# Patient Record
Sex: Male | Born: 1960 | Race: Black or African American | Hispanic: No | Smoking: Former smoker
Health system: Southern US, Community
[De-identification: ages and names within clinical notes are randomized; demographics above are authoritative.]

## PROBLEM LIST (undated history)

## (undated) DIAGNOSIS — Z9981 Dependence on supplemental oxygen: Secondary | ICD-10-CM

## (undated) DIAGNOSIS — I5020 Unspecified systolic (congestive) heart failure: Secondary | ICD-10-CM

## (undated) DIAGNOSIS — Z794 Long term (current) use of insulin: Secondary | ICD-10-CM

## (undated) DIAGNOSIS — K219 Gastro-esophageal reflux disease without esophagitis: Secondary | ICD-10-CM

## (undated) DIAGNOSIS — I10 Essential (primary) hypertension: Secondary | ICD-10-CM

## (undated) DIAGNOSIS — J449 Chronic obstructive pulmonary disease, unspecified: Secondary | ICD-10-CM

## (undated) DIAGNOSIS — Z95810 Presence of automatic (implantable) cardiac defibrillator: Secondary | ICD-10-CM

## (undated) DIAGNOSIS — Q859 Phakomatosis, unspecified: Secondary | ICD-10-CM

## (undated) DIAGNOSIS — N189 Chronic kidney disease, unspecified: Secondary | ICD-10-CM

## (undated) DIAGNOSIS — I2510 Atherosclerotic heart disease of native coronary artery without angina pectoris: Secondary | ICD-10-CM

## (undated) DIAGNOSIS — Z8739 Personal history of other diseases of the musculoskeletal system and connective tissue: Secondary | ICD-10-CM

## (undated) DIAGNOSIS — E1140 Type 2 diabetes mellitus with diabetic neuropathy, unspecified: Secondary | ICD-10-CM

## (undated) DIAGNOSIS — E7800 Pure hypercholesterolemia, unspecified: Secondary | ICD-10-CM

## (undated) DIAGNOSIS — I509 Heart failure, unspecified: Secondary | ICD-10-CM

## (undated) DIAGNOSIS — F419 Anxiety disorder, unspecified: Secondary | ICD-10-CM

## (undated) HISTORY — PX: ICD IMPLANT: EP1208

## (undated) HISTORY — PX: MULTIPLE TOOTH EXTRACTIONS: SHX2053

## (undated) HISTORY — PX: CIRCUMCISION: SUR203

## (undated) HISTORY — PX: EYE SURGERY: SHX253

## (undated) HISTORY — PX: CATARACT EXTRACTION W/ INTRAOCULAR LENS IMPLANTW/ TRABECULECTOMY: SHX1312

---

## 2000-08-19 ENCOUNTER — Emergency Department (HOSPITAL_COMMUNITY): Admission: EM | Admit: 2000-08-19 | Discharge: 2000-08-19 | Payer: Self-pay | Admitting: Emergency Medicine

## 2000-08-19 ENCOUNTER — Encounter: Payer: Self-pay | Admitting: Emergency Medicine

## 2000-10-17 ENCOUNTER — Emergency Department (HOSPITAL_COMMUNITY): Admission: EM | Admit: 2000-10-17 | Discharge: 2000-10-17 | Payer: Self-pay | Admitting: Emergency Medicine

## 2001-04-17 ENCOUNTER — Emergency Department (HOSPITAL_COMMUNITY): Admission: EM | Admit: 2001-04-17 | Discharge: 2001-04-18 | Payer: Self-pay | Admitting: *Deleted

## 2001-04-17 ENCOUNTER — Encounter: Payer: Self-pay | Admitting: Emergency Medicine

## 2001-04-23 ENCOUNTER — Emergency Department (HOSPITAL_COMMUNITY): Admission: EM | Admit: 2001-04-23 | Discharge: 2001-04-23 | Payer: Self-pay | Admitting: *Deleted

## 2001-07-30 ENCOUNTER — Emergency Department (HOSPITAL_COMMUNITY): Admission: EM | Admit: 2001-07-30 | Discharge: 2001-07-30 | Payer: Self-pay | Admitting: Emergency Medicine

## 2002-05-14 ENCOUNTER — Emergency Department (HOSPITAL_COMMUNITY): Admission: EM | Admit: 2002-05-14 | Discharge: 2002-05-14 | Payer: Self-pay | Admitting: Emergency Medicine

## 2002-05-14 ENCOUNTER — Encounter: Payer: Self-pay | Admitting: Emergency Medicine

## 2002-05-15 ENCOUNTER — Emergency Department (HOSPITAL_COMMUNITY): Admission: EM | Admit: 2002-05-15 | Discharge: 2002-05-15 | Payer: Self-pay | Admitting: Emergency Medicine

## 2002-05-20 ENCOUNTER — Encounter: Payer: Self-pay | Admitting: Emergency Medicine

## 2002-05-20 ENCOUNTER — Emergency Department (HOSPITAL_COMMUNITY): Admission: EM | Admit: 2002-05-20 | Discharge: 2002-05-20 | Payer: Self-pay | Admitting: Emergency Medicine

## 2004-03-28 ENCOUNTER — Emergency Department: Payer: Self-pay | Admitting: Emergency Medicine

## 2004-03-28 ENCOUNTER — Other Ambulatory Visit: Payer: Self-pay

## 2004-05-15 ENCOUNTER — Emergency Department: Payer: Self-pay | Admitting: Emergency Medicine

## 2017-01-11 DIAGNOSIS — Z95810 Presence of automatic (implantable) cardiac defibrillator: Secondary | ICD-10-CM

## 2017-01-11 HISTORY — DX: Presence of automatic (implantable) cardiac defibrillator: Z95.810

## 2017-10-31 ENCOUNTER — Inpatient Hospital Stay (HOSPITAL_COMMUNITY)
Admission: EM | Admit: 2017-10-31 | Discharge: 2017-11-06 | DRG: 246 | Disposition: A | Discharge status: Home Health | Payer: Medicare Other | Attending: Internal Medicine | Admitting: Internal Medicine

## 2017-10-31 ENCOUNTER — Other Ambulatory Visit: Payer: Self-pay

## 2017-10-31 ENCOUNTER — Emergency Department (HOSPITAL_COMMUNITY): Payer: Medicare Other

## 2017-10-31 ENCOUNTER — Encounter (HOSPITAL_COMMUNITY): Payer: Self-pay

## 2017-10-31 DIAGNOSIS — Z9981 Dependence on supplemental oxygen: Secondary | ICD-10-CM

## 2017-10-31 DIAGNOSIS — Z833 Family history of diabetes mellitus: Secondary | ICD-10-CM

## 2017-10-31 DIAGNOSIS — I2510 Atherosclerotic heart disease of native coronary artery without angina pectoris: Secondary | ICD-10-CM

## 2017-10-31 DIAGNOSIS — Z579 Occupational exposure to unspecified risk factor: Secondary | ICD-10-CM

## 2017-10-31 DIAGNOSIS — I214 Non-ST elevation (NSTEMI) myocardial infarction: Secondary | ICD-10-CM

## 2017-10-31 DIAGNOSIS — Z794 Long term (current) use of insulin: Secondary | ICD-10-CM

## 2017-10-31 DIAGNOSIS — I5022 Chronic systolic (congestive) heart failure: Secondary | ICD-10-CM | POA: Diagnosis present

## 2017-10-31 DIAGNOSIS — Z885 Allergy status to narcotic agent status: Secondary | ICD-10-CM

## 2017-10-31 DIAGNOSIS — E782 Mixed hyperlipidemia: Secondary | ICD-10-CM | POA: Diagnosis present

## 2017-10-31 DIAGNOSIS — I130 Hypertensive heart and chronic kidney disease with heart failure and stage 1 through stage 4 chronic kidney disease, or unspecified chronic kidney disease: Secondary | ICD-10-CM | POA: Diagnosis not present

## 2017-10-31 DIAGNOSIS — S81801A Unspecified open wound, right lower leg, initial encounter: Secondary | ICD-10-CM | POA: Diagnosis not present

## 2017-10-31 DIAGNOSIS — I509 Heart failure, unspecified: Secondary | ICD-10-CM

## 2017-10-31 DIAGNOSIS — L97929 Non-pressure chronic ulcer of unspecified part of left lower leg with unspecified severity: Secondary | ICD-10-CM | POA: Diagnosis present

## 2017-10-31 DIAGNOSIS — I25758 Atherosclerosis of native coronary artery of transplanted heart with other forms of angina pectoris: Secondary | ICD-10-CM | POA: Diagnosis not present

## 2017-10-31 DIAGNOSIS — I5043 Acute on chronic combined systolic (congestive) and diastolic (congestive) heart failure: Secondary | ICD-10-CM | POA: Diagnosis not present

## 2017-10-31 DIAGNOSIS — Z7951 Long term (current) use of inhaled steroids: Secondary | ICD-10-CM

## 2017-10-31 DIAGNOSIS — I420 Dilated cardiomyopathy: Secondary | ICD-10-CM | POA: Diagnosis present

## 2017-10-31 DIAGNOSIS — K219 Gastro-esophageal reflux disease without esophagitis: Secondary | ICD-10-CM | POA: Insufficient documentation

## 2017-10-31 DIAGNOSIS — J9621 Acute and chronic respiratory failure with hypoxia: Secondary | ICD-10-CM

## 2017-10-31 DIAGNOSIS — R0603 Acute respiratory distress: Secondary | ICD-10-CM | POA: Diagnosis not present

## 2017-10-31 DIAGNOSIS — R195 Other fecal abnormalities: Secondary | ICD-10-CM | POA: Diagnosis present

## 2017-10-31 DIAGNOSIS — E874 Mixed disorder of acid-base balance: Secondary | ICD-10-CM | POA: Diagnosis present

## 2017-10-31 DIAGNOSIS — I161 Hypertensive emergency: Secondary | ICD-10-CM | POA: Diagnosis not present

## 2017-10-31 DIAGNOSIS — R0689 Other abnormalities of breathing: Secondary | ICD-10-CM | POA: Diagnosis not present

## 2017-10-31 DIAGNOSIS — E872 Acidosis: Secondary | ICD-10-CM | POA: Diagnosis not present

## 2017-10-31 DIAGNOSIS — R296 Repeated falls: Secondary | ICD-10-CM | POA: Diagnosis present

## 2017-10-31 DIAGNOSIS — D751 Secondary polycythemia: Secondary | ICD-10-CM

## 2017-10-31 DIAGNOSIS — R0902 Hypoxemia: Secondary | ICD-10-CM | POA: Diagnosis not present

## 2017-10-31 DIAGNOSIS — Z95810 Presence of automatic (implantable) cardiac defibrillator: Secondary | ICD-10-CM

## 2017-10-31 DIAGNOSIS — Z79899 Other long term (current) drug therapy: Secondary | ICD-10-CM | POA: Diagnosis not present

## 2017-10-31 DIAGNOSIS — F410 Panic disorder [episodic paroxysmal anxiety] without agoraphobia: Secondary | ICD-10-CM

## 2017-10-31 DIAGNOSIS — Z961 Presence of intraocular lens: Secondary | ICD-10-CM | POA: Diagnosis present

## 2017-10-31 DIAGNOSIS — R0601 Orthopnea: Secondary | ICD-10-CM

## 2017-10-31 DIAGNOSIS — Z9889 Other specified postprocedural states: Secondary | ICD-10-CM

## 2017-10-31 DIAGNOSIS — J9622 Acute and chronic respiratory failure with hypercapnia: Secondary | ICD-10-CM

## 2017-10-31 DIAGNOSIS — Z7984 Long term (current) use of oral hypoglycemic drugs: Secondary | ICD-10-CM | POA: Diagnosis not present

## 2017-10-31 DIAGNOSIS — N189 Chronic kidney disease, unspecified: Secondary | ICD-10-CM | POA: Diagnosis present

## 2017-10-31 DIAGNOSIS — Z8249 Family history of ischemic heart disease and other diseases of the circulatory system: Secondary | ICD-10-CM

## 2017-10-31 DIAGNOSIS — I878 Other specified disorders of veins: Secondary | ICD-10-CM | POA: Diagnosis present

## 2017-10-31 DIAGNOSIS — I25118 Atherosclerotic heart disease of native coronary artery with other forms of angina pectoris: Secondary | ICD-10-CM

## 2017-10-31 DIAGNOSIS — J9601 Acute respiratory failure with hypoxia: Secondary | ICD-10-CM | POA: Diagnosis not present

## 2017-10-31 DIAGNOSIS — N183 Chronic kidney disease, stage 3 (moderate): Secondary | ICD-10-CM | POA: Diagnosis not present

## 2017-10-31 DIAGNOSIS — Z6832 Body mass index (BMI) 32.0-32.9, adult: Secondary | ICD-10-CM

## 2017-10-31 DIAGNOSIS — J449 Chronic obstructive pulmonary disease, unspecified: Secondary | ICD-10-CM | POA: Diagnosis present

## 2017-10-31 DIAGNOSIS — I872 Venous insufficiency (chronic) (peripheral): Secondary | ICD-10-CM | POA: Diagnosis not present

## 2017-10-31 DIAGNOSIS — I5023 Acute on chronic systolic (congestive) heart failure: Secondary | ICD-10-CM | POA: Diagnosis not present

## 2017-10-31 DIAGNOSIS — G4733 Obstructive sleep apnea (adult) (pediatric): Secondary | ICD-10-CM | POA: Diagnosis present

## 2017-10-31 DIAGNOSIS — E1140 Type 2 diabetes mellitus with diabetic neuropathy, unspecified: Secondary | ICD-10-CM

## 2017-10-31 DIAGNOSIS — Q859 Phakomatosis, unspecified: Secondary | ICD-10-CM

## 2017-10-31 DIAGNOSIS — I10 Essential (primary) hypertension: Secondary | ICD-10-CM | POA: Diagnosis present

## 2017-10-31 DIAGNOSIS — J9602 Acute respiratory failure with hypercapnia: Secondary | ICD-10-CM | POA: Diagnosis present

## 2017-10-31 DIAGNOSIS — I361 Nonrheumatic tricuspid (valve) insufficiency: Secondary | ICD-10-CM | POA: Diagnosis not present

## 2017-10-31 DIAGNOSIS — Z9849 Cataract extraction status, unspecified eye: Secondary | ICD-10-CM

## 2017-10-31 DIAGNOSIS — E119 Type 2 diabetes mellitus without complications: Secondary | ICD-10-CM | POA: Diagnosis not present

## 2017-10-31 DIAGNOSIS — R0602 Shortness of breath: Secondary | ICD-10-CM

## 2017-10-31 DIAGNOSIS — N62 Hypertrophy of breast: Secondary | ICD-10-CM | POA: Diagnosis present

## 2017-10-31 DIAGNOSIS — R358 Other polyuria: Secondary | ICD-10-CM | POA: Diagnosis present

## 2017-10-31 DIAGNOSIS — E1122 Type 2 diabetes mellitus with diabetic chronic kidney disease: Secondary | ICD-10-CM | POA: Diagnosis present

## 2017-10-31 DIAGNOSIS — Z87891 Personal history of nicotine dependence: Secondary | ICD-10-CM

## 2017-10-31 DIAGNOSIS — I428 Other cardiomyopathies: Secondary | ICD-10-CM | POA: Diagnosis not present

## 2017-10-31 DIAGNOSIS — I110 Hypertensive heart disease with heart failure: Secondary | ICD-10-CM | POA: Diagnosis not present

## 2017-10-31 DIAGNOSIS — E876 Hypokalemia: Secondary | ICD-10-CM | POA: Diagnosis present

## 2017-10-31 DIAGNOSIS — S81802A Unspecified open wound, left lower leg, initial encounter: Secondary | ICD-10-CM | POA: Diagnosis not present

## 2017-10-31 DIAGNOSIS — X58XXXA Exposure to other specified factors, initial encounter: Secondary | ICD-10-CM | POA: Diagnosis not present

## 2017-10-31 DIAGNOSIS — E669 Obesity, unspecified: Secondary | ICD-10-CM | POA: Diagnosis present

## 2017-10-31 DIAGNOSIS — E785 Hyperlipidemia, unspecified: Secondary | ICD-10-CM

## 2017-10-31 DIAGNOSIS — R17 Unspecified jaundice: Secondary | ICD-10-CM

## 2017-10-31 HISTORY — DX: Pure hypercholesterolemia, unspecified: E78.00

## 2017-10-31 HISTORY — DX: Gastro-esophageal reflux disease without esophagitis: K21.9

## 2017-10-31 HISTORY — DX: Chronic kidney disease, unspecified: N18.9

## 2017-10-31 HISTORY — DX: Chronic obstructive pulmonary disease, unspecified: J44.9

## 2017-10-31 HISTORY — DX: Atherosclerotic heart disease of native coronary artery without angina pectoris: I25.10

## 2017-10-31 HISTORY — DX: Dependence on supplemental oxygen: Z99.81

## 2017-10-31 HISTORY — DX: Anxiety disorder, unspecified: F41.9

## 2017-10-31 HISTORY — DX: Personal history of other diseases of the musculoskeletal system and connective tissue: Z87.39

## 2017-10-31 HISTORY — DX: Long term (current) use of insulin: Z79.4

## 2017-10-31 HISTORY — DX: Unspecified systolic (congestive) heart failure: I50.20

## 2017-10-31 HISTORY — DX: Presence of automatic (implantable) cardiac defibrillator: Z95.810

## 2017-10-31 HISTORY — DX: Type 2 diabetes mellitus with diabetic neuropathy, unspecified: E11.40

## 2017-10-31 HISTORY — DX: Phakomatosis, unspecified: Q85.9

## 2017-10-31 HISTORY — DX: Essential (primary) hypertension: I10

## 2017-10-31 LAB — CBC WITH DIFFERENTIAL/PLATELET
Basophils Absolute: 0 10*3/uL (ref 0.0–0.1)
Basophils Relative: 0 %
EOS ABS: 0 10*3/uL (ref 0.0–0.7)
Eosinophils Relative: 0 %
HCT: 56 % — ABNORMAL HIGH (ref 39.0–52.0)
Hemoglobin: 17.7 g/dL — ABNORMAL HIGH (ref 13.0–17.0)
LYMPHS ABS: 1.2 10*3/uL (ref 0.7–4.0)
LYMPHS PCT: 21 %
MCH: 31.9 pg (ref 26.0–34.0)
MCHC: 31.6 g/dL (ref 30.0–36.0)
MCV: 101.1 fL — AB (ref 78.0–100.0)
MONO ABS: 0.8 10*3/uL (ref 0.1–1.0)
Monocytes Relative: 14 %
Neutro Abs: 3.6 10*3/uL (ref 1.7–7.7)
Neutrophils Relative %: 65 %
PLATELETS: 253 10*3/uL (ref 150–400)
RBC: 5.54 MIL/uL (ref 4.22–5.81)
RDW: 14.9 % (ref 11.5–15.5)
WBC: 5.7 10*3/uL (ref 4.0–10.5)

## 2017-10-31 LAB — COMPREHENSIVE METABOLIC PANEL
ALBUMIN: 3.6 g/dL (ref 3.5–5.0)
ALT: 53 U/L (ref 17–63)
AST: 46 U/L — AB (ref 15–41)
Alkaline Phosphatase: 88 U/L (ref 38–126)
Anion gap: 11 (ref 5–15)
BILIRUBIN TOTAL: 2.1 mg/dL — AB (ref 0.3–1.2)
BUN: 17 mg/dL (ref 6–20)
CHLORIDE: 99 mmol/L — AB (ref 101–111)
CO2: 33 mmol/L — ABNORMAL HIGH (ref 22–32)
CREATININE: 1.37 mg/dL — AB (ref 0.61–1.24)
Calcium: 9 mg/dL (ref 8.9–10.3)
GFR calc Af Amer: >60 mL/min (ref >60)
GFR, EST NON AFRICAN AMERICAN: 57 mL/min — AB (ref >60)
GLUCOSE: 105 mg/dL — AB (ref 65–99)
POTASSIUM: 4.4 mmol/L (ref 3.5–5.1)
Sodium: 143 mmol/L (ref 135–145)
TOTAL PROTEIN: 7 g/dL (ref 6.5–8.1)

## 2017-10-31 LAB — I-STAT CHEM 8, ED
BUN: 26 mg/dL — ABNORMAL HIGH (ref 6–20)
CALCIUM ION: 1.08 mmol/L — AB (ref 1.15–1.40)
Chloride: 97 mmol/L — ABNORMAL LOW (ref 101–111)
Creatinine, Ser: 1.3 mg/dL — ABNORMAL HIGH (ref 0.61–1.24)
GLUCOSE: 101 mg/dL — AB (ref 65–99)
HCT: 57 % — ABNORMAL HIGH (ref 39.0–52.0)
HEMOGLOBIN: 19.4 g/dL — AB (ref 13.0–17.0)
Potassium: 4.3 mmol/L (ref 3.5–5.1)
SODIUM: 143 mmol/L (ref 135–145)
TCO2: 39 mmol/L — AB (ref 22–32)

## 2017-10-31 LAB — I-STAT ARTERIAL BLOOD GAS, ED
ACID-BASE EXCESS: 6 mmol/L — AB (ref 0.0–2.0)
BICARBONATE: 36.9 mmol/L — AB (ref 20.0–28.0)
O2 SAT: 97 %
PCO2 ART: 70.5 mmHg — AB (ref 32.0–48.0)
PO2 ART: 92 mmHg (ref 83.0–108.0)
Patient temperature: 96
TCO2: 39 mmol/L — AB (ref 22–32)
pH, Arterial: 7.32 — ABNORMAL LOW (ref 7.350–7.450)

## 2017-10-31 LAB — CBC
HCT: 56.3 % — ABNORMAL HIGH (ref 39.0–52.0)
HEMOGLOBIN: 18 g/dL — AB (ref 13.0–17.0)
MCH: 32.6 pg (ref 26.0–34.0)
MCHC: 32 g/dL (ref 30.0–36.0)
MCV: 102 fL — ABNORMAL HIGH (ref 78.0–100.0)
Platelets: 244 10*3/uL (ref 150–400)
RBC: 5.52 MIL/uL (ref 4.22–5.81)
RDW: 14.8 % (ref 11.5–15.5)
WBC: 5.7 10*3/uL (ref 4.0–10.5)

## 2017-10-31 LAB — I-STAT TROPONIN, ED: Troponin i, poc: 0.59 ng/mL (ref 0.00–0.08)

## 2017-10-31 LAB — CREATININE, SERUM
CREATININE: 1.37 mg/dL — AB (ref 0.61–1.24)
GFR calc Af Amer: >60 mL/min (ref >60)
GFR calc non Af Amer: 57 mL/min — ABNORMAL LOW (ref >60)

## 2017-10-31 LAB — HEMOGLOBIN A1C
HEMOGLOBIN A1C: 10.4 % — AB (ref 4.8–5.6)
Mean Plasma Glucose: 251.78 mg/dL

## 2017-10-31 LAB — GLUCOSE, CAPILLARY: Glucose-Capillary: 87 mg/dL (ref 65–99)

## 2017-10-31 LAB — TROPONIN I
TROPONIN I: 0.49 ng/mL — AB (ref <0.03)
Troponin I: 0.58 ng/mL (ref <0.03)

## 2017-10-31 LAB — BRAIN NATRIURETIC PEPTIDE: B Natriuretic Peptide: 671.4 pg/mL — ABNORMAL HIGH (ref 0.0–100.0)

## 2017-10-31 MED ORDER — INSULIN ASPART 100 UNIT/ML ~~LOC~~ SOLN
0.0000 [IU] | Freq: Every day | SUBCUTANEOUS | Status: DC
  Administered 2017-11-01: 2 [IU] via SUBCUTANEOUS
  Administered 2017-11-02: 22:00:00 3 [IU] via SUBCUTANEOUS
  Administered 2017-11-03 – 2017-11-05 (×3): 2 [IU] via SUBCUTANEOUS

## 2017-10-31 MED ORDER — ACETAMINOPHEN 325 MG PO TABS: 650.0000 mg | ORAL_TABLET | Freq: Four times a day (QID) | ORAL | Status: DC | PRN

## 2017-10-31 MED ORDER — PRAVASTATIN SODIUM 20 MG PO TABS: 20.0000 mg | ORAL_TABLET | Freq: Every day | ORAL | Status: DC

## 2017-10-31 MED ORDER — POLYETHYLENE GLYCOL 3350 17 G PO PACK: 17.0000 g | PACK | Freq: Every day | ORAL | Status: DC | PRN

## 2017-10-31 MED ORDER — HEPARIN (PORCINE) IN NACL 100-0.45 UNIT/ML-% IJ SOLN: 14.0000 [IU]/kg/h | Freq: Once | INTRAMUSCULAR | Status: DC

## 2017-10-31 MED ORDER — ACETAMINOPHEN 650 MG RE SUPP: 650.0000 mg | Freq: Four times a day (QID) | RECTAL | Status: DC | PRN

## 2017-10-31 MED ORDER — HEPARIN BOLUS VIA INFUSION
4000.0000 [IU] | Freq: Once | INTRAVENOUS | Status: AC
Start: 2017-10-31 — End: 2017-10-31
  Administered 2017-10-31: 4000 [IU] via INTRAVENOUS
  Filled 2017-10-31: qty 4000

## 2017-10-31 MED ORDER — NITROGLYCERIN IN D5W 200-5 MCG/ML-% IV SOLN
0.0000 ug/min | Freq: Once | INTRAVENOUS | Status: AC
  Administered 2017-10-31: 5 ug/min via INTRAVENOUS
  Filled 2017-10-31: qty 250

## 2017-10-31 MED ORDER — LORAZEPAM 2 MG/ML IJ SOLN
1.0000 mg | Freq: Once | INTRAMUSCULAR | Status: AC
  Administered 2017-10-31: 1 mg via INTRAVENOUS
  Filled 2017-10-31: qty 1

## 2017-10-31 MED ORDER — ONDANSETRON HCL 4 MG PO TABS: 4.0000 mg | ORAL_TABLET | Freq: Four times a day (QID) | ORAL | Status: DC | PRN

## 2017-10-31 MED ORDER — HEPARIN (PORCINE) IN NACL 100-0.45 UNIT/ML-% IJ SOLN
1300.0000 [IU]/h | INTRAMUSCULAR | Status: DC
  Administered 2017-10-31: 1300 [IU]/h via INTRAVENOUS
  Filled 2017-10-31 (×2): qty 250

## 2017-10-31 MED ORDER — FUROSEMIDE 10 MG/ML IJ SOLN
60.0000 mg | Freq: Once | INTRAMUSCULAR | Status: AC
  Administered 2017-10-31: 60 mg via INTRAVENOUS
  Filled 2017-10-31: qty 6

## 2017-10-31 MED ORDER — SODIUM CHLORIDE 0.9% FLUSH
3.0000 mL | Freq: Two times a day (BID) | INTRAVENOUS | Status: DC
Start: 2017-10-31 — End: 2017-11-06
  Administered 2017-11-01 – 2017-11-05 (×4): 3 mL via INTRAVENOUS

## 2017-10-31 MED ORDER — INSULIN ASPART 100 UNIT/ML ~~LOC~~ SOLN
0.0000 [IU] | Freq: Three times a day (TID) | SUBCUTANEOUS | Status: DC
  Administered 2017-11-01: 5 [IU] via SUBCUTANEOUS
  Administered 2017-11-01: 3 [IU] via SUBCUTANEOUS
  Administered 2017-11-02 – 2017-11-03 (×2): 5 [IU] via SUBCUTANEOUS

## 2017-10-31 MED ORDER — HEPARIN SODIUM (PORCINE) 5000 UNIT/ML IJ SOLN: 60.0000 [IU]/kg | Freq: Once | INTRAMUSCULAR | Status: DC

## 2017-10-31 MED ORDER — FUROSEMIDE 10 MG/ML IJ SOLN
40.0000 mg | Freq: Two times a day (BID) | INTRAMUSCULAR | Status: DC
  Administered 2017-10-31 – 2017-11-01 (×3): 40 mg via INTRAVENOUS
  Filled 2017-10-31 (×3): qty 4

## 2017-10-31 MED ORDER — DULOXETINE HCL 60 MG PO CPEP
60.0000 mg | ORAL_CAPSULE | Freq: Every day | ORAL | Status: DC
  Administered 2017-11-01 – 2017-11-06 (×5): 60 mg via ORAL
  Filled 2017-10-31 (×5): qty 1

## 2017-10-31 MED ORDER — SACUBITRIL-VALSARTAN 49-51 MG PO TABS
1.0000 | ORAL_TABLET | Freq: Two times a day (BID) | ORAL | Status: DC
  Administered 2017-10-31 – 2017-11-06 (×11): 1 via ORAL
  Filled 2017-10-31 (×12): qty 1

## 2017-10-31 MED ORDER — ENOXAPARIN SODIUM 80 MG/0.8ML ~~LOC~~ SOLN
0.5000 mg/kg | SUBCUTANEOUS | Status: DC
  Administered 2017-11-01 – 2017-11-03 (×2): 65 mg via SUBCUTANEOUS
  Filled 2017-10-31 (×2): qty 0.8
  Filled 2017-10-31: qty 0.65
  Filled 2017-10-31: qty 0.8

## 2017-10-31 MED ORDER — MAGNESIUM OXIDE 400 (241.3 MG) MG PO TABS
400.0000 mg | ORAL_TABLET | Freq: Every day | ORAL | Status: DC
  Administered 2017-10-31 – 2017-11-06 (×6): 400 mg via ORAL
  Filled 2017-10-31 (×6): qty 1

## 2017-10-31 MED ORDER — PANTOPRAZOLE SODIUM 40 MG PO TBEC
40.0000 mg | DELAYED_RELEASE_TABLET | Freq: Every day | ORAL | Status: DC
  Administered 2017-10-31 – 2017-11-06 (×6): 40 mg via ORAL
  Filled 2017-10-31 (×6): qty 1

## 2017-10-31 MED ORDER — ONDANSETRON HCL 4 MG/2ML IJ SOLN: 4.0000 mg | Freq: Four times a day (QID) | INTRAMUSCULAR | Status: DC | PRN

## 2017-10-31 MED ORDER — PREGABALIN 75 MG PO CAPS
225.0000 mg | ORAL_CAPSULE | Freq: Every day | ORAL | Status: DC
  Administered 2017-11-01 – 2017-11-06 (×5): 225 mg via ORAL
  Filled 2017-10-31 (×2): qty 3
  Filled 2017-10-31: qty 9
  Filled 2017-10-31 (×2): qty 3

## 2017-10-31 NOTE — Progress Notes (Signed)
ANTICOAGULATION CONSULT NOTE  Pharmacy Consult for heparin - Lovenox for VTE prophylaxis  Patient Measurements: Height: 6\' 5"  (195.6 cm) Weight: 280 lb (127 kg) IBW/kg (Calculated) : 89.1   Labs: Recent Labs    10/31/17 1038 10/31/17 1053 10/31/17 1411 10/31/17 1620  HGB 17.7* 19.4*  --  18.0*  HCT 56.0* 57.0*  --  56.3*  PLT 253  --   --  244  CREATININE 1.37* 1.30*  --  1.37*  TROPONINI  --   --  0.58*  --     Estimated Creatinine Clearance: 89.9 mL/min (A) (by C-G formula based on SCr of 1.37 mg/dL (H)).  Assessment/Plan: 63 YOM here with shortness of breath to started IV heparin for r/o ACS now to switch to Lovenox for VTE prophylaxis. Body mass index is 33.2 kg/m. Will begin Lovenox 65 mg SQ q24h (0.5 mg/kg/d). No bleeding noted, Hgb 18, platelets are normal. Pharmacy signing off.   Loura Back, PharmD, BCPS Clinical Pharmacist Clinical phone for 10/31/2017 until 10p is [phone] After 10p, please call Main Rx at [phone] for assistance 10/31/2017 7:25 PM

## 2017-10-31 NOTE — Progress Notes (Signed)
ANTICOAGULATION CONSULT NOTE - Initial Consult  Pharmacy Consult for heparin Indication: chest pain/ACS  Allergies  Allergen Reactions  . Codeine     Patient Measurements:  Actual body weight: 122 kg (patient reported)  Heparin Dosing Weight: 106 kg (esimated)  Vital Signs: Temp: 97.4 F (36.3 C) (05/08 1149) Temp Source: Oral (05/08 1149) BP: 182/127 (05/08 1149) Pulse Rate: 106 (05/08 1149)  Labs: Recent Labs    10/31/17 1038 10/31/17 1053  HGB 17.7* 19.4*  HCT 56.0* 57.0*  PLT 253  --   CREATININE 1.37* 1.30*    CrCl cannot be calculated (Unknown ideal weight.).   Medical History: Past Medical History:  Diagnosis Date  . CHF (congestive heart failure) (HCC)   . COPD (chronic obstructive pulmonary disease) (HCC)   . Diabetes mellitus without complication (HCC)   . GERD (gastroesophageal reflux disease)   . Hypertension   . Pacemaker     Medications:   (Not in a hospital admission)  Assessment: 80 YOM here with shortness of breath to start IV heparin for ACS. Initial troponin is 0.59. H/H 19.4/57. Plt wnl. He is not on any anticoagulation prior to admission.   Goal of Therapy:  Heparin level 0.3-0.7 units/ml Monitor platelets by anticoagulation protocol: Yes   Plan:  -Heparin 4000 units once, then start heparin at 1300 units/hr  -F/u 8 hr HL -Monitor daily HL, CBC and s/s of bleeding  Vinnie Level, PharmD., BCPS Clinical Pharmacist Clinical phone for 10/31/17 until 3:30pm: [phone] If after 3:30pm, please call main pharmacy at: [phone]

## 2017-10-31 NOTE — ED Notes (Addendum)
Pt still lethargic but arousable with stimulation. MD paged d/t patient has an IP bed assigned but it is on a tele unit which is inappropriate. Nitro gtt stopped per MD.

## 2017-10-31 NOTE — Consult Note (Addendum)
Cardiology Consultation:   Patient ID: Richard [name]; [id number]; [birth year]   Admit date: 10/31/2017 Date of Consult: 10/31/2017  Primary Care Provider: Patient, No Pcp Per Primary Cardiologist: No primary care provider on file.  Primary Electrophysiologist:  Dall[facility] [place]   Patient Profile:   Richard [name] is a 57 y.o. male with a hx of CHF who is being seen today for the evaluation of acute HF exacerbation and abnormal troponin at the request of Dr. Rogelia Boga.  History of Present Illness:   Richard [name] presented today with extreme dyspnea, confusion, severe hypoxemia and cough productive of frothy sputum.  He was temporarily treated with BiPAP but has improved and is now on oxygen via nasal cannula.  He is a very poor historian.  It is very difficult to obtain review of systems from him and he clearly does not have a lot of knowledge about his heart condition.  He is upset that "nobody ever told him that his heart is weak" until today.  Chronic medical problems include type 2 diabetes mellitus, hypertension, hyperlipidemia and apparently dilated cardiomyopathy, based on today's findings  Although he clearly had a defibrillator implanted and is followed by Richard [name] in [facility], I cannot find any records in care everywhere searching with both his name and his date of birth.  Chest x-ray shows pulmonary edema and a dual-chamber defibrillator.  There is massive cardiomegaly.  His arterial blood gas showed mild acidosis (pH 7.32), parameters consistent with chronic respiratory acidosis with metabolic compensation with superimposed acute hypercapnia (pCO2 70, pO2 90).  When asked about obstructive sleep apnea, he gave me a circuitous answer that I really did not understand.  He describes what sounds like cardiac catheterization via femoral approach in 2018.  He thinks they discovered one blockage and fixed it.  He denies ever having a myocardial infarction.  Subsequently he had a  defibrillator implanted which was last checked on July 25, 2017.  He reports that he usually weighs around 260 pounds, he has not weighed himself in several days but when he last checked he was up to 280 pounds.  He complains of poor appetite and abdominal bloating.  He does not understand why he is gaining weight since he eats very little.  He has noticed that his urine has been very concentrated recently.   He states that he adds "very little salt to his food, but he reports that he has been eating a lot of canned soups recently and he is not sure whether his wife has any salt to the food when she cooks.  Compliance with medicine is questionable.  When I asked him about Sherryll Burger he tells me that "he was supposed to pick it up from the pharmacy today".  He states that he sometimes skips his medicine because they make him feel sick, but that he takes his "pressure pill", his "fluid pill" and his "heart pill".  He does not know the names of any of these medications.  He does not know the name of his cardiologist and [place], but identifies Richard [name] as the physician who implanted his defibrillator (July 2018).  He has a Medtronic evera XT DR device implanted January 11, 2017 that has roughly 10.4 years of estimated longevity.  It was implanted for "primary prevention".  The device has never delivered therapy for ventricular arrhythmia and has not detected any atrial fibrillation.  He does not require atrial or ventricular pacing.  Typical activity level is around 2 hours/day, but  in the last week it has decreased to 1 hour/day.  Also in the last week there has been reversal of the normal day/night heart rate gradients.  His thoracic impedance (optivol) has shown evidence of fluid overload since October 2018 lead parameters are normal.  Past Medical History:  Diagnosis Date  . CHF (congestive heart failure) (HCC)   . COPD (chronic obstructive pulmonary disease) (HCC)   . Diabetes mellitus without  complication (HCC)   . GERD (gastroesophageal reflux disease)   . Hypertension   . Pacemaker    Past surgical history is significant for defibrillator implantation.  Family history is negative for cardiac illness, but the patient appears poorly informed.  Home Medications:  Prior to Admission medications   Medication Sig Start Date End Date Taking? Authorizing Provider  allopurinol (ZYLOPRIM) 100 MG tablet Take 100 mg by mouth daily. 08/11/17  Yes [provider]  BREO ELLIPTA 100-25 MCG/INH AEPB Inhale 1 puff into the lungs daily. 10/28/17  Yes [provider]  DULoxetine (CYMBALTA) 60 MG capsule Take 60 mg by mouth daily. 09/28/17  Yes [provider]  ENTRESTO 49-51 MG Take 1 tablet by mouth 2 (two) times daily. 10/08/17  Yes [provider]  gemfibrozil (LOPID) 600 MG tablet Take 600 mg by mouth 2 (two) times daily. 08/11/17  Yes [provider]  glipiZIDE (GLUCOTROL XL) 10 MG 24 hr tablet Take 10 mg by mouth 2 (two) times daily. 09/18/17  Yes [provider]  HUMULIN R U-500 KWIKPEN 500 UNIT/ML kwikpen Inject into the skin See admin instructions. Sliding scale as directed max daily dose 400 units 10/17/17  Yes [provider]  LYRICA 225 MG capsule Take 225 mg by mouth daily. 10/01/17  Yes [provider]  MAGNESIUM-OXIDE 400 (241.3 Mg) MG tablet Take 400 mg by mouth daily. 10/28/17  Yes [provider]  metoprolol succinate (TOPROL-XL) 25 MG 24 hr tablet Take 25 mg by mouth daily. 10/04/17  Yes [provider]  omeprazole (PRILOSEC) 20 MG capsule Take 20 mg by mouth daily. 10/04/17  Yes [provider]  pravastatin (PRAVACHOL) 20 MG tablet Take 20 mg by mouth daily. 09/06/17  Yes [provider]  torsemide (DEMADEX) 20 MG tablet Take 20 mg by mouth daily. 10/08/17  Yes [provider]  Vitamin D, Cholecalciferol, 1000 units CAPS Take 1,000 Units by mouth daily. 10/17/17  Yes [provider]    Inpatient Medications: Scheduled Meds: . DULoxetine  60 mg Oral Daily  . furosemide  40 mg Intravenous BID  . insulin aspart  0-15 Units Subcutaneous TID WC  . insulin aspart  0-5 Units Subcutaneous QHS  . magnesium oxide  400 mg Oral Daily  . pantoprazole  40 mg Oral Daily  . pravastatin  20 mg Oral q1800  . pregabalin  225 mg Oral Daily  . sacubitril-valsartan  1 tablet Oral BID  . sodium chloride flush  3 mL Intravenous Q12H   Continuous Infusions: . heparin 1,300 Units/hr (10/31/17 1306)   PRN Meds: acetaminophen **OR** acetaminophen, ondansetron **OR** ondansetron (ZOFRAN) IV, polyethylene glycol  Allergies:    Allergies  Allergen Reactions  . Codeine     Social History:   Social History   Socioeconomic History  . Marital status: Married    Spouse name: Not on file  . Number of children: Not on file  . Years of education: Not on file  . Highest education level: Not on file  Occupational History  .  Not on file  Social Needs  . Financial resource strain: Not on file  . Food insecurity:    Worry: Not on file    Inability: Not on file  . Transportation needs:    Medical: Not on file    Non-medical: Not on file  Tobacco Use  . Smoking status: Not on file  Substance and Sexual Activity  . Alcohol use: Not on file  . Drug use: Not on file  . Sexual activity: Not on file  Lifestyle  . Physical activity:    Days per week: Not on file    Minutes per session: Not on file  . Stress: Not on file  Relationships  . Social connections:    Talks on phone: Not on file    Gets together: Not on file    Attends religious service: Not on file    Active member of club or organization: Not on file    Attends meetings of clubs or organizations: Not on file    Relationship status: Not on file  . Intimate partner violence:    Fear of current or ex partner: Not on file    Emotionally abused: Not on file    Physically abused: Not on file    Forced  sexual activity: Not on file  Other Topics Concern  . Not on file  Social History Narrative  . Not on file    Family History:   Denies cardiac problems, but the patient has little knowledge of his family's medical problems.  ROS:  Please see the history of present illness.   All other ROS reviewed and negative.     Physical Exam/Data:   Vitals:   10/31/17 1515 10/31/17 1530 10/31/17 1545 10/31/17 1600  BP: 129/87 (!) 134/98 (!) 128/101 (!) 126/108  Pulse:  83 (!) 42 (!) 42  Resp: (!) 22 20 17 18   Temp:      TempSrc:      SpO2:  92% 97% 96%  Patient reported weight is 280 pounds.  Intake/Output Summary (Last 24 hours) at 10/31/2017 1757 Last data filed at 10/31/2017 1515 Gross per 24 hour  Intake 7.45 ml  Output -  Net 7.45 ml    General:  Well nourished, well developed, tachypnea, but no longer in acute distress. HEENT: normal Lymph: no adenopathy Neck: Jugular venous pulsations are elevated to the angle of the jaw and there is no additional hepatojugular reflux Endocrine:  No thryomegaly Vascular: No carotid bruits; FA pulses 2+ bilaterally without bruits  Cardiac:  normal S1, S2; RRR; S3 gallop is heard.  There is a 1/6 apical holosystolic murmur radiating towards the axilla that is difficult to hear.  There may be a separate 1/6 left lower sternal border murmur that is also holosystolic. Lungs:  clear to auscultation bilaterally, no wheezing, rhonchi or rales  Abd: soft, moderately distended, suspect ascites and hepatomegaly Ext: no edema.  However he has chronic brawny dermatitis changes suggesting previous edema.  Of note he had Unna boots bilaterally when he was admitted to the emergency room but these have now been removed. 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  QTc prolonged at 490 ms Telemetry:  Telemetry was personally reviewed and demonstrates: Normal sinus rhythm  Relevant CV Studies: ICD check as described above.  Thoracic impedance is well above normal range, but no recent ventricular or atrial arrhythmia detected.  No significant atrial or ventricular pacing.  Laboratory Data:  Chemistry Recent Labs  Lab 10/31/17 1038 10/31/17 1053 10/31/17 1620  NA 143 143  --   K 4.4 4.3  --   CL 99* 97*  --   CO2 33*  --   --   GLUCOSE 105* 101*  --   BUN 17 26*  --   CREATININE 1.37* 1.30* 1.37*  CALCIUM 9.0  --   --   GFRNONAA 57*  --  57*  GFRAA >60  --  >60  ANIONGAP 11  --   --     Recent Labs  Lab 10/31/17 1038  PROT 7.0  ALBUMIN 3.6  AST 46*  ALT 53  ALKPHOS 88  BILITOT 2.1*   Hematology Recent Labs  Lab 10/31/17 1038 10/31/17 1053 10/31/17 1620  WBC 5.7  --  5.7  RBC 5.54  --  5.52  HGB 17.7* 19.4* 18.0*  HCT 56.0* 57.0* 56.3*  MCV 101.1*  --  102.0*  MCH 31.9  --  32.6  MCHC 31.6  --  32.0  RDW 14.9  --  14.8  PLT 253  --  244   Cardiac EnzymesNo results for input(s): TROPONINI in the last 168 hours.  Recent Labs  Lab 10/31/17 1051  TROPIPOC 0.59*    BNP Recent Labs  Lab 10/31/17 1038  BNP 671.4*    DDimer No results for input(s): DDIMER in the last 168 hours.  Radiology/Studies:  Dg Chest Port 1 View  Result Date: 10/31/2017 CLINICAL DATA:  Acute onset of shortness of breath. Current history of COPD and CHF. EXAM: PORTABLE CHEST 1 VIEW COMPARISON:  09/06/2017, 01/12/2017 and earlier. FINDINGS: Cardiac silhouette markedly enlarged, unchanged. Interval development of moderate diffuse interstitial pulmonary edema since the most recent prior examination. No visible confluent airspace consolidation. The previously  identified circumscribed mass in the LEFT lower lobe is much less conspicuous on the portable examination. IMPRESSION: Moderate CHF, with stable marked cardiomegaly and moderate diffuse interstitial pulmonary edema. Electronically Signed   By: Hulan Saas M.D.   On: 10/31/2017 10:27    Assessment and Plan:   1. Acute respiratory failure: With combined hypoxia and hypercapnia.  Acute decompensation is due to congestive heart failure, but suspect he has underlying chronic hypoventilation may be due to COPD and/or obesity hypoventilation syndrome.  Chronicity of hypoventilation is supported by polycythemia and chronic metabolic alkalosis.  He is clearly a "CO2 retainer".  Avoid high flow oxygen. 2. CHF: Acute exacerbation of chronic heart failure, suspect combined systolic and diastolic.  It is unclear whether he has ischemic cardiomyopathy (he definitely has many risk factors, but denies angina) or nonischemic cardiomyopathy.  Old records would be very valuable.  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  There is evidence of elevation in both left and right heart pressures and per his report he is at least 20 pounds above his target weight.  There is clear reported sodium indiscretion and it is possible that he has also missed many of his medications.  Unable to find any records of his cardiac illness in the electronic medical record, even though he is  followed in [facility].  At this point he will benefit most from treatment with intravenous diuretics and restarting his Entresto.  Since he is barely compensated, I would avoid restarting beta-blockers at this time.  He might benefit from unloading with intravenous nitroglycerin, temporarily.  For the time being he does not appear to need inotropes. We will ask our heart failure service to evaluate him in the morning.  3. DM: Receiving both oral  antidiabetic's and insulin.  I think he would benefit from switching from a sulfonylurea to an SGLT2 inhibitor such as Jardiance. 4. HLP: He is on gemfibrozil and pravastatin at home, just and that he has mixed hyperlipidemia.  This may be due to poorly controlled diabetes mellitus.  I think it is best to set a target LDL less than 70. 5. Renal insufficiency: Acuity unknown, could simply be due to hemodynamic decompensation or could have chronic kidney disease related to hypertension and diabetes mellitus.  Again, old records are not valuable in this situation. 6. Abn troponin: readily explained by heart failure exacerbation and resp failure, no reason to suspect a true acute coronary event.   For questions or updates, please contact [facility] Please consult [email or web address] for contact info under Cardiology/STEMI.   Signed, Thurmon Fair, MD  10/31/2017 5:57 PM

## 2017-10-31 NOTE — H&P (Signed)
Date: 10/31/2017               Patient Name:  Richard [name] MRN: [id number]  DOB: Nov 07, 1960 Age / Sex: 57 y.o., male   PCP: Ananias Pilgrim, MD         Medical Service: Internal Medicine Teaching Service         Attending Physician: Dr. Odom Spain, MD    First Contact: Brooke Bonito Pager: [phone]  Second Contact: Dr. Arnetha Courser Pager: [phone]       After Hours (After 5p/  First Contact Pager: [phone]  weekends / holidays): Second Contact Pager: [phone]   Chief Complaint: Shortness of Breath  History of Present Illness: Richard [name] is a 40 yoM with a PMHx of severe COPD, systolic heart failure secondary to nonischemic cardiomyopathy with ICD, nonobstructive CAD, HTN, HLD, and CKD who presents with shortness of breath, cough and symptoms of respiratory distress.  Patient was quite somnolent during interview, as he had just received 1mg  Ativan. As a result much of HPI is gleaned from chart review. He arrived by ambulance to [facility] ED with tachypnea requiring BiPAP. Highest recorded blood pressure from ED was 168/148, with diastolic pressures consistently over 120. He was placed on a nitroglycerin drip and a heparin drip. Within the first two hours, he was weaned from BiPAP to his home O2 requirement of 2L.  He states that he typically has 1.5 pillow orthopnea but had to add an additional pillow last night. He denies headache, nausea/vomiting, diarrhea, vision changes, but does feel that his abdomen is particularly distended today. He states that his last BM was early this morning. He endorses polyuria without dysuria.  He reports that his dry weight is 265 lbs and knows to frequently weigh himself, though he states that lately his weights have been all over the place. He endorses waking up gasping for breath approximately twice per night. He states that he has tried to wear a CPAP but "it didn't work that good."  For cardiology, he sees Dr. Lance Morin of [facility] in HF clinic & last visit was 10/02/2017 (records obtained). At that visit, records reveal that he was suffering from lightheadedness and subjective signs of volume contraction so his torsemide dose was decreased from 20mg  BID to qd and acetazolamide was added to his regimen. Richard [name] states that it doesn't seem that the torsemide is working for him anymore and that it seems like he has been taking it for too long. His home regimen at that time also included ASA, Entresto 50mg  BID, Gemfibrozil, metoprolol XL 25mg  qd, pravastatin and spironolactone 25mg  qd.  Meds:  Current Meds  Medication Sig  . allopurinol (ZYLOPRIM) 100 MG tablet Take 100 mg by mouth daily.  Marland Kitchen BREO ELLIPTA 100-25 MCG/INH AEPB Inhale 1 puff into the lungs daily.  . DULoxetine (CYMBALTA) 60 MG capsule Take 60 mg by mouth daily.  Marland Kitchen ENTRESTO 49-51 MG Take 1 tablet by mouth 2 (two) times daily.  Marland Kitchen gemfibrozil (LOPID) 600 MG tablet Take 600 mg by mouth 2 (two) times daily.  Marland Kitchen glipiZIDE (GLUCOTROL XL) 10 MG 24 hr tablet Take 10 mg by mouth 2 (two) times daily.  Marland Kitchen HUMULIN R U-500 KWIKPEN 500 UNIT/ML kwikpen Inject into the skin See admin instructions. Sliding scale as directed max daily dose 400 units  . LYRICA 225 MG capsule Take 225 mg by mouth daily.  Marland Kitchen MAGNESIUM-OXIDE 400 (241.3 Mg) MG  tablet Take 400 mg by mouth daily.  . metoprolol succinate (TOPROL-XL) 25 MG 24 hr tablet Take 25 mg by mouth daily.  Marland Kitchen omeprazole (PRILOSEC) 20 MG capsule Take 20 mg by mouth daily.  . pravastatin (PRAVACHOL) 20 MG tablet Take 20 mg by mouth daily.  Marland Kitchen torsemide (DEMADEX) 20 MG tablet Take 20 mg by mouth daily.  . Vitamin D, Cholecalciferol, 1000 units CAPS Take 1,000 Units by mouth daily.     Allergies: Allergies as of 10/31/2017 - Review Complete 10/31/2017  Allergen Reaction Noted  . Codeine  10/31/2017   Past Medical History:  Diagnosis Date  . CKD (chronic kidney disease)   . COPD (chronic  obstructive pulmonary disease) (HCC)    never smoker, industrial exposure  . Diabetes mellitus with diabetic neuropathy, with long-term current use of insulin (HCC)   . GERD (gastroesophageal reflux disease)   . Hypertension   . ICD (implantable cardioverter-defibrillator) in place   . Nonobstructive atherosclerosis of coronary artery   . On home oxygen therapy   . 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HCC)    LLL, present for years  . Systolic HF (heart failure) (HCC)     Family History: Positive for HTN, DM and heart problems.  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 Now on disability.  Review of Systems: A complete ROS was negative except as per HPI.  Physical Exam: Blood pressure (!) 129/93, pulse 94, temperature (!) (P) 97.2 F (36.2 C), temperature source (P) Oral, resp. rate (!) 32, SpO2 95 %.   Physical Exam  Constitutional: He appears well-developed. He appears ill (wearing nasal cannula). He appears distressed.  HENT:  Head: Normocephalic and atraumatic.  Eyes: Right conjunctiva is injected. Left conjunctiva is injected.  Cardiovascular: Normal rate and regular rhythm.  Palpable ICD  Pulmonary/Chest: Effort normal and breath sounds normal. He has no rales.  Abdominal: Soft. Bowel sounds are normal. He exhibits distension.  Musculoskeletal:       Right lower leg: Normal. Right lower leg edema: bilateral skin breakdown.       Left lower leg: Normal. Left lower leg edema: bilateral skin breakdown.  Neurological: He is alert. He is disoriented (mildly).  Skin: Skin is warm.  Psychiatric: He has a normal mood and affect. His behavior is normal.   Lab Results  Component Value Date   WBC 5.7 10/31/2017   HGB 18.0 (H) 10/31/2017   HCT 56.3 (H) 10/31/2017   PLT 244 10/31/2017   GLUCOSE 101 (H) 10/31/2017   ALT 53 10/31/2017   AST 46 (H) 10/31/2017   NA 143 10/31/2017   K 4.3 10/31/2017   CL 97 (L) 10/31/2017   CREATININE  1.37 (H) 10/31/2017   BUN 26 (H) 10/31/2017   CO2 33 (H) 10/31/2017   HGBA1C 10.4 (H) 10/31/2017    His arterial blood gas showed mild acidosis (pH 7.32), parameters consistent with chronic respiratory acidosis with metabolic compensation with superimposed acute hypercapnia (pCO2 70, pO2 90).  Echocardiogram pending. Per [facility] notes, last echo 10/17/16.  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 ST not elevated.  CXR: personally reviewed my interpretation is interstitial edema throughout, without evidence of infiltrate. LLL not well visualized.  Assessment & Plan by Problem: Principal Problem:   Systolic HF (heart failure) (HCC) Active Problems:   COPD (chronic obstructive pulmonary disease) (HCC)   Hypertension   CKD (chronic kidney disease)   Diabetes mellitus with diabetic  neuropathy, with long-term current use of insulin (HCC)   #HTN, Systolic HF, NICM: Mr. Wittkopp presented to the ED in acute respiratory distress consistent with flash pulmonary edema brought on by hypertensive emergency. Patient was placed on BiPAP in ED which we would like to avoid in the future as patient is chronic CO2 retainer. His arterial blood gas showed mild acidosis (pH 7.32), parameters consistent with chronic respiratory acidosis with metabolic compensation with superimposed acute hypercapnia (pCO2 70, pO2 90). Troponins were elevated to 0.58. Per clinic notes, last EF was 20-25%. Patient is NYHA II-IIIA. Home O2 requirement is 2L. - Cardiology consulted, see note for recs - Continue to trend troponins - s/p IV lasix 60mg  in ED; sch lasix 40mg  IV BID - Continue home Entresto and spironolactone  #COPD: Home regimen includes Breo inhaler qd.  DM: Home regimen includes insulin and glipizide. Patient would benefit from SGLT2 inhibitor in the future. - HbA1c 10.4 - Cr 1.37 - Urinalysis in process - SSI, home duloxetine and pregabalin  OSA: Patient may  have sleep apnea as he mentions failure to wear CPAP in the past. This is not documented in his HF clinic notes. Patient would likely benefit from sleep study and CPAP rx for long term HTN prevention.  VTE ppx: Lovenox  Dispo: Admit patient to Inpatient with expected length of stay greater than 2 midnights.  Signed: Dow Adolph, Medical Student 10/31/2017, 7:05 PM  Pager: [phone]  Attestation for Student Documentation:  I personally was present and performed or re-performed the history, physical exam and medical decision-making activities of this service and have verified that the service and findings are accurately documented in the student's note.  Arnetha Courser, MD 10/31/2017, 7:55 PM

## 2017-10-31 NOTE — ED Triage Notes (Signed)
[facility]- pt coming from home with complaint of shortness of breath. He wears 2L of O2 via nasal cannula at all times, increased to 4 today d/t SOB. Pt has hx CHF and COPD. Pt states he has been having some anxiety. Diminished in all lung fields per EMS. Pt had NRB mask in place on arrival and SPO2 was 99%. Sats initially 70% on EMS arrival. Pt had 1 nitro en route. BP improved greatly. Pt does have some abdominal distention and reports dark stool.

## 2017-10-31 NOTE — ED Provider Notes (Addendum)
[facility] EMERGENCY DEPARTMENT Provider Note   CSN: [id number] Arrival date & time: 10/31/17  0931     History   Chief Complaint Chief Complaint  Patient presents with  . Shortness of Breath    HPI Richard [name] is a 57 y.o. male.  Patient brought in by EMS.  Patient called EMS.  Patient normally wears 2 L of oxygen all the time.  But he increased it to 4 today due to her shortness of breath.  Has a history of CHF and COPD.  Patient also was examined sitting anxiety.  Diminished breath sounds in all fields.  Patient was placed on a nonrebreather mask and arrived with oxygen sat 99%.  Sats initially were 70% according to EMS.  Patient had one nitro in route as well.  Blood pressure reportedly greatly improved but was quite elevated when he arrived here.  Patient also with some abdominal distention.  And he mentioned EMS dark stools.  Patient stated that he had drank heavily in the past but has not had any alcohol for many many months.  In addition patient states he has a history of a pacemaker.  Patient very poor historian.     Past Medical History:  Diagnosis Date  . CHF (congestive heart failure) (HCC)   . COPD (chronic obstructive pulmonary disease) (HCC)   . Diabetes mellitus without complication (HCC)   . GERD (gastroesophageal reflux disease)   . Hypertension   . Pacemaker     There are no active problems to display for this patient.       Home Medications    Prior to Admission medications   Medication Sig Start Date End Date Taking? Authorizing Provider  allopurinol (ZYLOPRIM) 100 MG tablet Take 100 mg by mouth daily. 08/11/17  Yes [provider]  BREO ELLIPTA 100-25 MCG/INH AEPB Inhale 1 puff into the lungs daily. 10/28/17  Yes [provider]  DULoxetine (CYMBALTA) 60 MG capsule Take 60 mg by mouth daily. 09/28/17  Yes [provider]  ENTRESTO 49-51 MG Take 1 tablet by mouth 2 (two) times daily. 10/08/17  Yes [provider]  gemfibrozil (LOPID) 600 MG tablet Take 600 mg by mouth 2 (two) times daily. 08/11/17  Yes [provider]  glipiZIDE (GLUCOTROL XL) 10 MG 24 hr tablet Take 10 mg by mouth 2 (two) times daily. 09/18/17  Yes [provider]  HUMULIN R U-500 KWIKPEN 500 UNIT/ML kwikpen Inject into the skin See admin instructions. Sliding scale as directed max daily dose 400 units 10/17/17  Yes [provider]  LYRICA 225 MG capsule Take 225 mg by mouth daily. 10/01/17  Yes [provider]  MAGNESIUM-OXIDE 400 (241.3 Mg) MG tablet Take 400 mg by mouth daily. 10/28/17  Yes [provider]  metoprolol succinate (TOPROL-XL) 25 MG 24 hr tablet Take 25 mg by mouth daily. 10/04/17  Yes [provider]  omeprazole (PRILOSEC) 20 MG capsule Take 20 mg by mouth daily. 10/04/17  Yes [provider]  pravastatin (PRAVACHOL) 20 MG tablet Take 20 mg by mouth daily. 09/06/17  Yes [provider]  torsemide (DEMADEX) 20 MG tablet Take 20 mg by mouth daily. 10/08/17  Yes [provider]  Vitamin D, Cholecalciferol, 1000 units CAPS Take 1,000 Units by mouth daily. 10/17/17  Yes [provider]    Family History No family history on file.  Social History Social History   Tobacco Use  . Smoking status: Not on file  Substance Use Topics  . Alcohol use: Not on file  . Drug use: Not on file     Allergies   Codeine   Review of Systems Review of Systems  Unable to perform ROS: Severe respiratory distress  Respiratory: Positive for shortness of breath.   Cardiovascular: Positive for leg swelling. Negative for chest pain.  Gastrointestinal: Positive for abdominal distention.     Physical Exam Updated Vital Signs BP (!) 182/127 (BP Location: Left Arm)   Pulse (!) 106   Temp (!) 97.4 F (36.3 C) (Oral)   Resp (!) 31   SpO2 93%   Physical Exam  Constitutional: He is oriented to person, place, and time. He appears  well-developed and well-nourished. He appears distressed.  HENT:  Head: Normocephalic and atraumatic.  Mouth/Throat: Oropharynx is clear and moist.  Eyes: Pupils are equal, round, and reactive to light. Conjunctivae and EOM are normal.  Neck: Neck supple.  Cardiovascular: Normal rate, regular rhythm and normal heart sounds.  Pulmonary/Chest: He is in respiratory distress. He has no wheezes. He has rales.  Abdominal: Soft. Bowel sounds are normal. He exhibits distension. There is no tenderness.  Musculoskeletal: Normal range of motion. He exhibits edema.   Patient had Unna boots on both legs.  These were removed without any significant edema or significant underlying wounds.  Patient has some superficial woundssignificant infection.  Neurological: He is alert and oriented to person, place, and time. No cranial nerve deficit or sensory deficit. He exhibits normal muscle tone. Coordination normal.  Skin: Skin is warm.  Nursing note and vitals reviewed.    ED Treatments / Results  Labs (all labs ordered are listed, but only abnormal results are displayed) Labs Reviewed  BRAIN NATRIURETIC PEPTIDE - Abnormal; Notable for the following components:      Result Value   B Natriuretic Peptide 671.4 (*)    All other components within normal limits  COMPREHENSIVE METABOLIC PANEL - Abnormal; Notable for the following components:   Chloride 99 (*)    CO2 33 (*)    Glucose, Bld 105 (*)    Creatinine, Ser 1.37 (*)    AST 46 (*)    Total Bilirubin 2.1 (*)    GFR calc non Af Amer 57 (*)    All other components within normal limits  CBC WITH DIFFERENTIAL/PLATELET - Abnormal; Notable for the following components:   Hemoglobin 17.7 (*)    HCT 56.0 (*)    MCV 101.1 (*)    All other components within normal limits  I-STAT CHEM 8, ED - Abnormal; Notable for the following components:   Chloride 97 (*)    BUN 26 (*)    Creatinine, Ser 1.30 (*)    Glucose, Bld 101 (*)    Calcium, Ion 1.08 (*)     TCO2 39 (*)    Hemoglobin 19.4 (*)    HCT 57.0 (*)    All other components within normal limits  I-STAT TROPONIN, ED - Abnormal; Notable for the following components:   Troponin i, poc 0.59 (*)    All other components within normal limits  HEPARIN LEVEL (UNFRACTIONATED)    EKG EKG Interpretation  Date/Time:  Wednesday Oct 31 2017 10:41:35 EDT Ventricular Rate:  93 PR Interval:    QRS Duration: 124 QT Interval:  394 QTC Calculation: 491 R Axis:   -114 Text Interpretation:  Age not entered, assumed to be  57 years old for purpose of ECG interpretation Sinus rhythm Borderline prolonged PR interval Left  atrial enlargement Nonspecific IVCD with LAD Probable lateral infarct, age indeterminate Anterior infarct, old Interpretation limited secondary to artifact Confirmed by Vanetta Mulders [phone]) on 10/31/2017 11:19:40 AM Also confirmed by Vanetta Mulders [phone]), editor Sheppard Evens ([id number])  on 10/31/2017 12:14:35 PM   Radiology Dg Chest Port 1 View  Result Date: 10/31/2017 CLINICAL DATA:  Acute onset of shortness of breath. Current history of COPD and CHF. EXAM: PORTABLE CHEST 1 VIEW COMPARISON:  09/06/2017, 01/12/2017 and earlier. FINDINGS: Cardiac silhouette markedly enlarged, unchanged. Interval development of moderate diffuse interstitial pulmonary edema since the most recent prior examination. No visible confluent airspace consolidation. The previously identified circumscribed mass in the LEFT lower lobe is much less conspicuous on the portable examination. IMPRESSION: Moderate CHF, with stable marked cardiomegaly and moderate diffuse interstitial pulmonary edema. Electronically Signed   By: Hulan Saas M.D.   On: 10/31/2017 10:27    Procedures Procedures (including critical care time)  CRITICAL CARE Performed by: Vanetta Mulders Total critical care time: 45 minutes Critical care time was exclusive of separately billable procedures and treating other patients. Critical care  was necessary to treat or prevent imminent or life-threatening deterioration. Critical care was time spent personally by me on the following activities: development of treatment plan with patient and/or surrogate as well as nursing, discussions with consultants, evaluation of patient's response to treatment, examination of patient, obtaining history from patient or surrogate, ordering and performing treatments and interventions, ordering and review of laboratory studies, ordering and review of radiographic studies, pulse oximetry and re-evaluation of patient's condition.    Medications Ordered in ED Medications  heparin bolus via infusion 4,000 Units (has no administration in time range)  heparin ADULT infusion 100 units/mL (25000 units/243mL sodium chloride 0.45%) (has no administration in time range)  LORazepam (ATIVAN) injection 1 mg (1 mg Intravenous Given 10/31/17 1135)  furosemide (LASIX) injection 60 mg (60 mg Intravenous Given 10/31/17 1141)  nitroGLYCERIN 50 mg in dextrose 5 % 250 mL (0.2 mg/mL) infusion (5 mcg/min Intravenous New Bag/Given 10/31/17 1146)     Initial Impression / Assessment and Plan / ED Course  I have reviewed the triage vital signs and the nursing notes.  Pertinent labs & imaging results that were available during my care of the patient were reviewed by me and considered in my medical decision making (see chart for details).    Patient presented with acute shortness of breath.  Normally on 2 L of oxygen at all times.  Arrived on 4.  Initially was very anxious.  Hypertensive chest x-ray consistent with congestive heart failure.  No wheezing.  Patient due to his anxiety requested antianxiety medicine so he was given 1 mg of Ativan that did settle him down and seemed to get a little confused this time went on he seemed to get more somnolent so arterial blood gas was done which showed CO2 retention.  Feel the patient is a bit of a retainer based on his elevated CO2 on his  electrolytes.  For this patient started on BiPAP which she has tolerated well.  Prior to starting him on BiPAP unassigned internal medicine had already been consulted made seen the patient.  Patient will had been started on Lasix and a nitroglycerin drip for the extensive hypertension and for the CHF.  Do not think patient is going to require intubation.  Suspect the BiPAP will stabilize him.  His pH was reasonable.  And patient was breathing well on his own and breathing very well on the BiPAP.  [name] past medical history and chart review is difficult to discern he is been apparently followed by somebody but we do not have any records of it.  Patient also was poor historian.  Patient has shown improvement with the Lasix and nitroglycerin drip in the BiPAP.  In addition patient's troponin was elevated this could be due just to the strain.  Or this could be a non-STEMI.  Patient started on heparin for this.  As stated patient's arterial blood gas with a PCO2 of 70 was the reason for BiPAP.  pH was 7.3   Final Clinical Impressions(s) / ED Diagnoses   Final diagnoses:  SOB (shortness of breath)  Congestive heart failure, unspecified HF chronicity, unspecified heart failure type (HCC)  Non-STEMI (non-ST elevated myocardial infarction) [facility])    ED Discharge Orders    None       Vanetta Mulders, MD 10/31/17 1645    Vanetta Mulders, MD 10/31/17 [phone]

## 2017-10-31 NOTE — Progress Notes (Signed)
Patient arrived from the ED to 4E27.  Telemetry monitor applied and CCMD notified.  Patient oriented to unit and room to include call light and phone.  Will continue to monitor.

## 2017-10-31 NOTE — Progress Notes (Signed)
Pt taken off NRB and placed on 5L [place]. VS within normal limits. Slight increased WOB. Refusing Bipap. RT will continue to monitor.

## 2017-10-31 NOTE — ED Notes (Addendum)
Called into the room by the admitting team. MD stated that patient had to urinate so they gave him a urinal and he was sitting on the edge of the bed. When I arrived into the room patient had urinated a large amount of urine into the floor, sitting at the foot of the bed, lethargic. Patient had pulled himself unplugged from oxygen and SPO2 monitor. SPO2 noted to be 77%. Placed patient back on 5L via nasal cannula and he quickly improved to 94%. Pt repositioned back into bed by second RN and myself.

## 2017-10-31 NOTE — ED Notes (Signed)
Pt appears to be more comfortable after ativan. Resp rate remains 30/min. Pt denies any chest pain.

## 2017-10-31 NOTE — ED Notes (Signed)
Called MD per request, she asked for patient to not have SPO2% higher than 90-92%.

## 2017-10-31 NOTE — ED Notes (Signed)
Bi-Pap removed from pt. And pt put on [place] at 4LPM.  Pt tolerating well

## 2017-10-31 NOTE — Progress Notes (Signed)
Pt taken off bipap as he was pulling on mask stating he wanted it off. Pt placed on 4L [place] at this time. VS within normal limits. Pt denies SOB, no increased WOB. RT will continue to monitor

## 2017-11-01 ENCOUNTER — Inpatient Hospital Stay (HOSPITAL_COMMUNITY): Payer: Medicare Other

## 2017-11-01 ENCOUNTER — Encounter (HOSPITAL_COMMUNITY): Payer: Self-pay | Admitting: General Practice

## 2017-11-01 ENCOUNTER — Other Ambulatory Visit: Payer: Self-pay

## 2017-11-01 DIAGNOSIS — I361 Nonrheumatic tricuspid (valve) insufficiency: Secondary | ICD-10-CM

## 2017-11-01 DIAGNOSIS — I5043 Acute on chronic combined systolic (congestive) and diastolic (congestive) heart failure: Secondary | ICD-10-CM

## 2017-11-01 LAB — TROPONIN I: TROPONIN I: 0.5 ng/mL — AB (ref <0.03)

## 2017-11-01 LAB — LIPID PANEL
CHOL/HDL RATIO: 4.5 ratio
Cholesterol: 148 mg/dL (ref 0–200)
HDL: 33 mg/dL — AB (ref >40)
LDL CALC: 98 mg/dL (ref 0–99)
TRIGLYCERIDES: 83 mg/dL (ref <150)
VLDL: 17 mg/dL (ref 0–40)

## 2017-11-01 LAB — CBC
HCT: 57 % — ABNORMAL HIGH (ref 39.0–52.0)
HEMOGLOBIN: 18.3 g/dL — AB (ref 13.0–17.0)
MCH: 32.6 pg (ref 26.0–34.0)
MCHC: 32.1 g/dL (ref 30.0–36.0)
MCV: 101.4 fL — AB (ref 78.0–100.0)
PLATELETS: 217 10*3/uL (ref 150–400)
RBC: 5.62 MIL/uL (ref 4.22–5.81)
RDW: 14.8 % (ref 11.5–15.5)
WBC: 6.3 10*3/uL (ref 4.0–10.5)

## 2017-11-01 LAB — GLUCOSE, CAPILLARY
Glucose-Capillary: 96 mg/dL (ref 65–99)
Glucose-Capillary: 171 mg/dL — ABNORMAL HIGH (ref 65–99)
Glucose-Capillary: 242 mg/dL — ABNORMAL HIGH (ref 65–99)
Glucose-Capillary: 206 mg/dL — ABNORMAL HIGH (ref 65–99)

## 2017-11-01 LAB — BASIC METABOLIC PANEL
ANION GAP: 10 (ref 5–15)
BUN: 15 mg/dL (ref 6–20)
CALCIUM: 9.2 mg/dL (ref 8.9–10.3)
CHLORIDE: 95 mmol/L — AB (ref 101–111)
CO2: 38 mmol/L — AB (ref 22–32)
Creatinine, Ser: 1.3 mg/dL — ABNORMAL HIGH (ref 0.61–1.24)
GFR calc non Af Amer: >60 mL/min (ref >60)
GLUCOSE: 94 mg/dL (ref 65–99)
POTASSIUM: 4.2 mmol/L (ref 3.5–5.1)
Sodium: 143 mmol/L (ref 135–145)

## 2017-11-01 LAB — HEPATIC FUNCTION PANEL
ALT: 52 U/L (ref 17–63)
AST: 40 U/L (ref 15–41)
Albumin: 3.8 g/dL (ref 3.5–5.0)
Alkaline Phosphatase: 87 U/L (ref 38–126)
BILIRUBIN INDIRECT: 1.7 mg/dL — AB (ref 0.3–0.9)
BILIRUBIN TOTAL: 2.1 mg/dL — AB (ref 0.3–1.2)
Bilirubin, Direct: 0.4 mg/dL (ref 0.1–0.5)
Total Protein: 7.6 g/dL (ref 6.5–8.1)

## 2017-11-01 LAB — PROTIME-INR
INR: 1.12
Prothrombin Time: 14.3 seconds (ref 11.4–15.2)

## 2017-11-01 LAB — ECHOCARDIOGRAM COMPLETE
Height: 77 in
Weight: 4345.71 oz

## 2017-11-01 LAB — HIV ANTIBODY (ROUTINE TESTING W REFLEX): HIV SCREEN 4TH GENERATION: NONREACTIVE

## 2017-11-01 LAB — HEPATITIS C ANTIBODY: HCV Ab: 0.3 s/co ratio (ref 0.0–0.9)

## 2017-11-01 LAB — LACTATE DEHYDROGENASE: LDH: 195 U/L — ABNORMAL HIGH (ref 98–192)

## 2017-11-01 MED ORDER — ORAL CARE MOUTH RINSE
15.0000 mL | Freq: Two times a day (BID) | OROMUCOSAL | Status: DC
  Administered 2017-11-04 – 2017-11-06 (×3): 15 mL via OROMUCOSAL

## 2017-11-01 MED ORDER — PERFLUTREN LIPID MICROSPHERE
1.0000 mL | INTRAVENOUS | Status: AC | PRN
  Administered 2017-11-01: 2 mL via INTRAVENOUS
  Filled 2017-11-01: qty 10

## 2017-11-01 MED ORDER — ALLOPURINOL 100 MG PO TABS
100.0000 mg | ORAL_TABLET | Freq: Every day | ORAL | Status: DC
  Administered 2017-11-01 – 2017-11-06 (×5): 100 mg via ORAL
  Filled 2017-11-01 (×5): qty 1

## 2017-11-01 MED ORDER — ASPIRIN EC 81 MG PO TBEC
81.0000 mg | DELAYED_RELEASE_TABLET | Freq: Every day | ORAL | Status: DC
  Administered 2017-11-01: 81 mg via ORAL
  Filled 2017-11-01: qty 1

## 2017-11-01 MED ORDER — SODIUM CHLORIDE 0.9% FLUSH: 3.0000 mL | INTRAVENOUS | Status: DC | PRN

## 2017-11-01 MED ORDER — ASPIRIN EC 81 MG PO TBEC
81.0000 mg | DELAYED_RELEASE_TABLET | Freq: Every day | ORAL | Status: DC
  Administered 2017-11-03 – 2017-11-06 (×4): 81 mg via ORAL
  Filled 2017-11-01 (×4): qty 1

## 2017-11-01 MED ORDER — ASPIRIN 81 MG PO CHEW
81.0000 mg | CHEWABLE_TABLET | ORAL | Status: AC
  Administered 2017-11-02: 81 mg via ORAL
  Filled 2017-11-01: qty 1

## 2017-11-01 MED ORDER — EPLERENONE 25 MG PO TABS
25.0000 mg | ORAL_TABLET | Freq: Every day | ORAL | Status: DC
  Administered 2017-11-01 – 2017-11-06 (×5): 25 mg via ORAL
  Filled 2017-11-01 (×7): qty 1

## 2017-11-01 MED ORDER — DIGOXIN 125 MCG PO TABS
0.1250 mg | ORAL_TABLET | Freq: Every day | ORAL | Status: DC
  Administered 2017-11-01 – 2017-11-06 (×5): 0.125 mg via ORAL
  Filled 2017-11-01 (×6): qty 1

## 2017-11-01 MED ORDER — SODIUM CHLORIDE 0.9 % IV SOLN
INTRAVENOUS | Status: DC
  Administered 2017-11-02: 07:00:00 via INTRAVENOUS

## 2017-11-01 MED ORDER — SODIUM CHLORIDE 0.9 % IV SOLN: 250.0000 mL | INTRAVENOUS | Status: DC | PRN

## 2017-11-01 MED ORDER — ATORVASTATIN CALCIUM 20 MG PO TABS
20.0000 mg | ORAL_TABLET | Freq: Every day | ORAL | Status: DC
  Administered 2017-11-01: 20 mg via ORAL
  Filled 2017-11-01: qty 1

## 2017-11-01 MED ORDER — SODIUM CHLORIDE 0.9% FLUSH
3.0000 mL | Freq: Two times a day (BID) | INTRAVENOUS | Status: DC
  Administered 2017-11-01 (×2): 3 mL via INTRAVENOUS

## 2017-11-01 MED ORDER — SPIRONOLACTONE 25 MG PO TABS
25.0000 mg | ORAL_TABLET | Freq: Every day | ORAL | Status: DC
  Administered 2017-11-01: 25 mg via ORAL
  Filled 2017-11-01: qty 1

## 2017-11-01 MED ORDER — SPIRONOLACTONE 12.5 MG HALF TABLET: 12.5000 mg | ORAL_TABLET | Freq: Every day | ORAL | Status: DC

## 2017-11-01 MED ORDER — ISOSORB DINITRATE-HYDRALAZINE 20-37.5 MG PO TABS
0.5000 | ORAL_TABLET | Freq: Three times a day (TID) | ORAL | Status: DC
  Administered 2017-11-01 (×2): 0.5 via ORAL
  Filled 2017-11-01 (×2): qty 1

## 2017-11-01 NOTE — Progress Notes (Signed)
Patient left room and went to echo at this time

## 2017-11-01 NOTE — Progress Notes (Signed)
Subjective: Richard [name] was sitting upright in the chair during the team visit this morning.   He was able to provide a little more history at the bedside, including that he is responsible for preparing his own medications. He states that he always takes them as prescribed and rarely misses doses. He uses [facility] for his medication and states that he fills them regularly.  He also reported that he has fallen a few times recently at home, mostly because he sits on the edge of the bed without back support to sleep. He falls forward while trying to sleep and referenced some scarring on his knees from prior incidents. He states that he has been unable to lay flat on his back in bed overnight for approximately 7 months. He states that he is usually able to walk ~1/4 mile daily, but for the last week or so has not been able to do so without having to stop and catch his breath.  Objective:  Vital signs in last 24 hours: Vitals:   11/01/17 0745 11/01/17 1118 11/01/17 1400 11/01/17 1439  BP: (!) 140/96 122/86 (!) 125/94   Pulse: 92 88 94 96  Resp: (!) 21 (!) 23 (!) 25   Temp: 98.2 F (36.8 C) 98.2 F (36.8 C)    TempSrc: Oral Oral    SpO2: 94% 96% 93%   Weight:      Height:       Physical Exam  Constitutional: He is oriented to person, place, and time. He appears well-developed.  HENT:  Head: Normocephalic and atraumatic.  Eyes: Pupils are equal, round, and reactive to light.  Cardiovascular: Normal rate. Exam reveals gallop (S3).  Pulmonary/Chest: Effort normal. He has rales in the right lower field and the left lower field.  Abdominal: Bowel sounds are normal. He exhibits distension. There is tenderness.  Musculoskeletal: He exhibits edema (BLE L>R).  Venous stasis changes BLE  Neurological: He is alert and oriented to person, place, and time.  Skin: Skin is warm and dry.  Psychiatric: He has a normal mood and affect. His behavior is normal.  Vitals reviewed.  Lab Results    Component Value Date   WBC 6.3 11/01/2017   HGB 18.3 (H) 11/01/2017   HCT 57.0 (H) 11/01/2017   PLT 217 11/01/2017   GLUCOSE 94 11/01/2017   CHOL 148 11/01/2017   TRIG 83 11/01/2017   HDL 33 (L) 11/01/2017   LDLCALC 98 11/01/2017   ALT 52 11/01/2017   AST 40 11/01/2017   NA 143 11/01/2017   K 4.2 11/01/2017   CL 95 (L) 11/01/2017   CREATININE 1.30 (H) 11/01/2017   BUN 15 11/01/2017   CO2 38 (H) 11/01/2017   INR 1.12 11/01/2017   HGBA1C 10.4 (H) 10/31/2017   Hepatic Function Latest Ref Rng & Units 11/01/2017 10/31/2017  Total Protein 6.5 - 8.1 g/dL 7.6 7.0  Albumin 3.5 - 5.0 g/dL 3.8 3.6  AST 15 - 41 U/L 40 46(H)  ALT 17 - 63 U/L 52 53  Alk Phosphatase 38 - 126 U/L 87 88  Total Bilirubin 0.3 - 1.2 mg/dL 2.1(H) 2.1(H)  Bilirubin, Direct 0.1 - 0.5 mg/dL 0.4 -    Echocardiogram 11/01/2017: Study Conclusions  - Left ventricle: The cavity size was severely dilated. There was   mild concentric hypertrophy. Systolic function was severely   reduced. The estimated ejection fraction was in the range of 15% to 20%. Diffuse hypokinesis. Features are consistent with a pseudonormal left ventricular filling  pattern, with concomitant   abnormal relaxation and increased filling pressure (grade 2   diastolic dysfunction). Doppler parameters are consistent with   elevated ventricular end-diastolic filling pressure. - Mitral valve: There was mild regurgitation. - Left atrium: The atrium was moderately dilated. - Right ventricle: The cavity size was severely dilated. Wall   thickness was normal. Systolic function was severely reduced. - Right atrium: The atrium was mildly dilated. - Tricuspid valve: There was mild regurgitation. - Pulmonary arteries: Systolic pressure was within the normal   range. - Inferior vena cava: The vessel was normal in size.  Impressions:  - Severe left and right ventricular dilatation. Severe   bi-ventricular systolic dysfunction. Grade 2 diastolic    dysfunction with elevated filloing pressures. No significant   valvular abnormalities.   Assessment/Plan:  Principal Problem:   Systolic HF (heart failure) (HCC) Active Problems:   COPD (chronic obstructive pulmonary disease) (HCC)   Hypertension   CKD (chronic kidney disease)   Diabetes mellitus with diabetic neuropathy, with long-term current use of insulin (HCC)   Acute on chronic combined systolic and diastolic CHF (congestive heart failure) (HCC)  #HTN, Acute on Chronic Systolic HF, Presumed NICM with ICD: Richard [name] presented to the ED in acute respiratory distress consistent with flash pulmonary edema brought on by hypertensive emergency. Patient was placed on BiPAP in ED which we would like to avoid in the future as patient is chronic CO2 retainer.His arterial blood gas showed mild acidosis (pH 7.32),parameters consistent with chronic respiratory acidosis with metabolic compensation with superimposed acute hypercapnia(pCO2 70, pO2 90). Troponins were elevated to 0.58, likely elevated to demand ischemia. Per clinic notes, last EF was 20-25% (09/2016).  Home O2 requirement is 2L. Cardiology concerned for low output HF, catheterization scheduled for tomorrow.  - Cardiology consulted, recs summarized below - Previously on spironolactone, d/c'd 2/2 gynecomastia; begin eplerenone 25 mg qd - Begin digoxin 0.170m qd - RHC/LHC tomorrow AM; prior in 2017 - sch lasix increased from 491mto 8055mV BID - Continue home Entresto - Begin Bidil 0.5 mg TID - Start BB when euvolemic - Candidate for cardiac rehab at D/C?  #COPD: Home regimen includes Breo inhaler qd. Home O2 requirement of 2L, currently requiring 3L.  #Hematologic Ab/ns: HgB 18.3 on admission, chart review reveals HgB of 18.7 recorded in 01/12/2017. This is likely the result of longstanding multifactorial hypoxia (HF/COPD/?OSA), but we will rule out polycythemia vera during this admission. Bilirubin (indirect) elevated, potential  for hemolysis. - JAK2 & EPO levels ordered - Daily CBC, smear review pending - LDH to r/o hemolysis  DM: Home regimen includes insulin and glipizide. Patient would benefit from SGLT2 inhibitor in the future. - HbA1c 10.4 - Cr 1.37 on adm, decr to 1.30 - Urinalysis in process - SSI, home duloxetine and pregabalin  OSA: Patient may have sleep apnea as he mentions failure to wear CPAP in the past. He also reports two negative sleep studies. This is not documented in his HF clinic notes. Patient would likely benefit from sleep study and CPAP rx for long term HTN prevention.  VTE ppx: Lovenox  Dispo: Anticipated discharge in approximately 2-3 day(s). Health literacy may be patient's biggest barrier to healthy discharge; continue daily education.  PowLaureen Ochsedical Student 11/01/2017, 3:19 PM Pager: [phone] for Student Documentation:  I personally was present and performed or re-performed the history, physical exam and medical decision-making activities of this service and have verified that the service and findings are accurately  documented in the student's note.  Lorella Nimrod, MD 11/01/2017, 5:12 PM

## 2017-11-01 NOTE — Progress Notes (Addendum)
Spoke with pharmacy, heparin was to be stopped and lovenox started per order. Heparin still running upon start of my shift but will be stopped at this time. Dr. Nelson Chimes made aware.   Ernestina Columbia, RN

## 2017-11-01 NOTE — Progress Notes (Signed)
Heart Failure Navigator Consult Note  Presentation: per Dr. Shirlee Latch: Richard [name] is a 57 y.o. male with chronic systolic CHF, ? ICM, HTN, DM2, and HLD.   Pt presented to [facility] 10/31/17 with severe DOE, confusion, and hypoxic respiratory failure requiring Bipap transitioned to O2 via [place]. CXR showed pulmonary edema and dual chamber ICD, + massive cardiomegaly. ABG with resp acidosis (pH 7.32, PCO2 70, pO2 90). Other pertinent labs on admission include K 4.4, Cr 1.37, BNP 671.4, Troponin peaked at 0.59 and trended down, WBC 5.7, Hgb 18.0, Hgb A1C 10.4.  Pt is a very poor historian. He does not know if he has sleep apnea. He denies ever having a "heart attack". States he had a cath in 2018 with a "blockage" that was fixed. Does not weigh daily, but thinks his baseline weight is near 260 lbs. Eats lots of canned soup and adds salt to his food. He has highly questionable compliance with his medications. He doesn't know any of the names of his meds.  He lives at home with his wife, and at least 4 of his 10 kids, the others filter in and out. He get SOB with bathing and changing clothes.   By ICD interrogation Med[employer]onic everaXT DR device implanted January 11, 2017. Implanted for "primary prevention". Has nevere received shock or had Afib detected. No Atrial or V-pacing. Activity level 1-2 hours a day. Optivol has been elevated since October 2018.  Echo pending.  LHC from 5/17: 60% stenosis proximal RCA, FFR 0.8.   Echo from 5/17: EF 15-20% with normal RV   Past Medical History:  Diagnosis Date  . CKD (chronic kidney disease)   . COPD (chronic obstructive pulmonary disease) (HCC)    never smoker, industrial exposure  . Diabetes mellitus with diabetic neuropathy, with long-term current use of insulin (HCC)   . GERD (gastroesophageal reflux disease)   . Hypertension   . ICD (implantable cardioverter-defibrillator) in place   . Nonobstructive atherosclerosis of coronary artery   . On home oxygen  therapy   . 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HCC)    LLL, present for years  . Systolic HF (heart failure) (HCC)     Social History   Socioeconomic History  . Marital status: Married    Spouse name: Not on file  . Number of children: Not on file  . Years of education: Not on file  . Highest education level: Not on file  Occupational History  . Occupation: disability    Comment: stopped working in 2000 d/t occupational exposures  Social Needs  . Financial resource strain: Not on file  . Food insecurity:    Worry: Not on file    Inability: Not on file  . Transportation needs:    Medical: Not on file    Non-medical: Not on file  Tobacco Use  . Smoking status: Former Games developer  . Smokeless tobacco: Never Used  Substance and Sexual Activity  . 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 Drug use: Never  . Sexual activity: Not on file  Lifestyle  . Physical activity:    Days per week: Not on file    Minutes per session: Not on file  . Stress: Not on file  Relationships  . Social connections:    Talks on phone: Not on file    Gets together: Not on file    Attends religious service: Not on file    Active  member of club or organization: Not on file    Attends meetings of clubs or organizations: Not on file    Relationship status: Not on file  Other Topics Concern  . Not on file  Social History Narrative  . Not on file    ECHO:Study Conclusions-11/01/17  - Left ventricle: The cavity size was severely dilated. There was   mild concentric hypertrophy. Systolic function was severely   reduced. The estimated ejection fraction was in the range of 15%   to 20%. Diffuse hypokinesis. Features are consistent with a   pseudonormal left ventricular filling pattern, with concomitant   abnormal relaxation and increased filling pressure (grade 2   diastolic dysfunction). Doppler parameters are consistent with   elevated ventricular  end-diastolic filling pressure. - Mitral valve: There was mild regurgitation. - Left atrium: The atrium was moderately dilated. - Right ventricle: The cavity size was severely dilated. Wall   thickness was normal. Systolic function was severely reduced. - Right atrium: The atrium was mildly dilated. - Tricuspid valve: There was mild regurgitation. - Pulmonary arteries: Systolic pressure was within the normal   range. - Inferior vena cava: The vessel was normal in size.  Impressions:  - Severe left and right ventricular dilatation. Severe   bi-ventricular systolic dysfunction. Grade 2 diastolic   dysfunction with elevated filloing pressures. No significant   valvular abnormalities.  BNP    Component Value Date/Time   BNP 671.4 (H) 10/31/2017 1038    ProBNP No results found for: PROBNP   Education Assessment and Provision:  Detailed education and instructions provided on heart failure disease management including the following:  Signs and symptoms of Heart Failure When to call the physician Importance of daily weights Low sodium diet Fluid restriction Medication management Anticipated future follow-up appointments  Patient education given on each of the above topics.  Patient acknowledges understanding and acceptance of all instructions.  I spoke with Richard [name] regarding his current hospitalization and HF diagnosis.  He does have scale at home and tells me that he weighs each day.  We discussed the importance of daily weights.  He seemed quite tired during our conversation (closed his eyes for periods of time) and we cut conversation short due to that.  He will follow with the [facility].  I will plan to return to assess his ability to afford medications as well his comprehension of HF education and recommendations for home.  Education Materials:  "Living Better With Heart Failure" Booklet, Daily Weight Tracker Tool.   High Risk Criteria for Readmission and/or Poor  Patient Outcomes:  (Recommend Follow-up with Advanced [facility] Failure Clinic)--yes    EF <30%-yes 15-20%  2 or more admissions in 6 months-1/43mo  Difficult social situation-denies  Demonstrates medication noncompliance-denies  Barriers of Care:  Knowledge and complaince  Discharge Planning:   Plans to return to home with wife and children.  He is agreeable to referral to the HF [facility] and I will complete this.

## 2017-11-01 NOTE — H&P (View-Only) (Signed)
Advanced Heart Failure Team Consult Note   Primary Physician: Ananias Pilgrim, MD PCP-Cardiologist:  No primary care provider on file.  Reason for Consultation: Acute on chronic systolic congestive HF, unknown EF  HPI:    Ison Wichmann is seen today for evaluation of acute on chronic congestive HF, unknown EF (At least < 35% with presence of ICD) at the request of Dr. Rogelia Boga.   Kewon Statler is a 57 y.o. male with chronic systolic CHF, ? ICM, HTN, DM2, and HLD.   Pt presented to [facility] 10/31/17 with severe DOE, confusion, and hypoxic respiratory failure requiring Bipap transitioned to O2 via . CXR showed pulmonary edema and dual chamber ICD, + massive cardiomegaly. ABG with resp acidosis (pH 7.32, PCO2 70, pO2 90). Other pertinent labs on admission include K 4.4, Cr 1.37, BNP 671.4, Troponin peaked at 0.59 and trended down, WBC 5.7, Hgb 18.0, Hgb A1C 10.4.  Pt is a very poor historian. He does not know if he has sleep apnea. He denies ever having a "heart attack". States he had a cath in 2018 with a "blockage" that was fixed. Does not weigh daily, but thinks his baseline weight is near 260 lbs. Eats lots of canned soup and adds salt to his food. He has highly questionable compliance with his medications. He doesn't know any of the names of his meds.  He lives at home with his wife, and at least 4 of his 10 kids, the others filter in and out. He get SOB with bathing and changing clothes.   By ICD interrogation Medtronic evera XT DR device implanted January 11, 2017. Implanted for "primary prevention". Has nevere received shock or had Afib detected. No Atrial or V-pacing. Activity level 1-2 hours a day. Optivol has been elevated since October 2018.  Echo pending.  LHC from 5/17: 60% stenosis proximal RCA, FFR 0.8.   Echo from 5/17: EF 15-20% with normal RV.   Review of systems complete and found to be negative unless listed in HPI.    Home Medications Prior to Admission medications     Medication Sig Start Date End Date Taking? Authorizing Provider  allopurinol (ZYLOPRIM) 100 MG tablet Take 100 mg by mouth daily. 08/11/17  Yes [provider]  BREO ELLIPTA 100-25 MCG/INH AEPB Inhale 1 puff into the lungs daily. 10/28/17  Yes [provider]  DULoxetine (CYMBALTA) 60 MG capsule Take 60 mg by mouth daily. 09/28/17  Yes [provider]  ENTRESTO 49-51 MG Take 1 tablet by mouth 2 (two) times daily. 10/08/17  Yes [provider]  gemfibrozil (LOPID) 600 MG tablet Take 600 mg by mouth 2 (two) times daily. 08/11/17  Yes [provider]  glipiZIDE (GLUCOTROL XL) 10 MG 24 hr tablet Take 10 mg by mouth 2 (two) times daily. 09/18/17  Yes [provider]  HUMULIN R U-500 KWIKPEN 500 UNIT/ML kwikpen Inject into the skin See admin instructions. Sliding scale as directed max daily dose 400 units 10/17/17  Yes [provider]  LYRICA 225 MG capsule Take 225 mg by mouth daily. 10/01/17  Yes [provider]  MAGNESIUM-OXIDE 400 (241.3 Mg) MG tablet Take 400 mg by mouth daily. 10/28/17  Yes [provider]  metoprolol succinate (TOPROL-XL) 25 MG 24 hr tablet Take 25 mg by mouth daily. 10/04/17  Yes [provider]  omeprazole (PRILOSEC) 20 MG capsule Take 20 mg by mouth daily. 10/04/17  Yes [provider]  pravastatin (PRAVACHOL) 20 MG tablet Take 20  mg by mouth daily. 09/06/17  Yes [provider]  torsemide (DEMADEX) 20 MG tablet Take 20 mg by mouth daily. 10/08/17  Yes [provider]  Vitamin D, Cholecalciferol, 1000 units CAPS Take 1,000 Units by mouth daily. 10/17/17  Yes [provider]    Past Medical History: Past Medical History:  Diagnosis Date  . CKD (chronic kidney disease)   . COPD (chronic obstructive pulmonary disease) (HCC)    never smoker, industrial exposure  . Diabetes mellitus with diabetic neuropathy, with long-term current use of insulin (HCC)   . GERD  (gastroesophageal reflux disease)   . Hypertension   . ICD (implantable cardioverter-defibrillator) in place   . Nonobstructive atherosclerosis of coronary artery   . On home oxygen therapy   . 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HCC)    LLL, present for years  . Systolic HF (heart failure) (HCC)     Past Surgical History: Past Surgical History:  Procedure Laterality Date  . CATARACT EXTRACTION W/ INTRAOCULAR LENS IMPLANTW/ TRABECULECTOMY    . CIRCUMCISION    . EYE SURGERY    . ICD IMPLANT     Medtronic Dual Chamber 01/11/17  . MOUTH SURGERY      Family History: Family History  Problem Relation Age of Onset  . Hypertension Mother   . Diabetes Mother   . Hypertension Father   . Diabetes Father   . Diabetes Sister   . Diabetes Brother     Social History: Social History   Socioeconomic History  . Marital status: Married    Spouse name: Not on file  . Number of children: Not on file  . Years of education: Not on file  . Highest education level: Not on file  Occupational History  . Occupation: disability    Comment: stopped working in 2000 d/t occupational exposures  Social Needs  . Financial resource strain: Not on file  . Food insecurity:    Worry: Not on file    Inability: Not on file  . Transportation needs:    Medical: Not on file    Non-medical: Not on file  Tobacco Use  . Smoking status: Former Games developer  . Smokeless tobacco: Never Used  Substance and Sexual Activity  . 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 Drug use: Never  . Sexual activity: Not on file  Lifestyle  . Physical activity:    Days per week: Not on file    Minutes per session: Not on file  . Stress: Not on file  Relationships  . Social connections:    Talks on phone: Not on file    Gets together: Not on file    Attends religious service: Not on file    Active member of club or organization: Not on file    Attends meetings of clubs or  organizations: Not on file    Relationship status: Not on file  Other Topics Concern  . Not on file  Social History Narrative  . Not on file    Allergies:  Allergies  Allergen Reactions  . Codeine     Objective:    Vital Signs:   Temp:  [96.9 F (36.1 C)-98.8 F (37.1 C)] 98.2 F (36.8 C) (05/09 0745) Pulse Rate:  [42-106] 92 (05/09 0745) Resp:  [17-32] 21 (05/09 0745) BP: (114-182)/(87-127) 140/96 (05/09 0745) SpO2:  [90 %-100 %] 94 % (05/09 0745) Weight:  [271 lb 9.7 oz (123.2  kg)-280 lb (127 kg)] 271 lb 9.7 oz (123.2 kg) (05/09 0054) Last BM Date: 10/31/17  Weight change: Filed Weights   10/31/17 1900 11/01/17 0054  Weight: 280 lb (127 kg) 271 lb 9.7 oz (123.2 kg)    Intake/Output:   Intake/Output Summary (Last 24 hours) at 11/01/2017 0944 Last data filed at 11/01/2017 0527 Gross per 24 hour  Intake 727.45 ml  Output 1600 ml  Net -872.55 ml      Physical Exam    General:  Fatigued appearing. Tearful. No resp difficulty HEENT: Normal Neck: supple. JVP difficult to assess. Carotids 2+ bilat; no bruits. No lymphadenopathy or thyromegaly appreciated. Cor: PMI nondisplaced. Regular rate & rhythm. 1/6 apical holosystolic murmur. +S3 Lungs: Clear Abdomen: Soft, nontender, nondistended. No hepatosplenomegaly. No bruits or masses. Good bowel sounds. Extremities: No cyanosis, clubbing, rash, edema Neuro: Alert & orientedx3, cranial nerves grossly intact. moves all 4 extremities w/o difficulty. Affect pleasant   Telemetry   NSR 80-90s, personally reviewed.   EKG    NSR 93 bpm, QRS 127 ms, personally reviewed.   Labs   Basic Metabolic Panel: Recent Labs  Lab 10/31/17 1038 10/31/17 1053 10/31/17 1620 11/01/17 0242  NA 143 143  --  143  K 4.4 4.3  --  4.2  CL 99* 97*  --  95*  CO2 33*  --   --  38*  GLUCOSE 105* 101*  --  94  BUN 17 26*  --  15  CREATININE 1.37* 1.30* 1.37* 1.30*  CALCIUM 9.0  --   --  9.2    Liver Function Tests: Recent Labs    Lab 10/31/17 1038  AST 46*  ALT 53  ALKPHOS 88  BILITOT 2.1*  PROT 7.0  ALBUMIN 3.6   No results for input(s): LIPASE, AMYLASE in the last 168 hours. No results for input(s): AMMONIA in the last 168 hours.  CBC: Recent Labs  Lab 10/31/17 1038 10/31/17 1053 10/31/17 1620 11/01/17 0242  WBC 5.7  --  5.7 6.3  NEUTROABS 3.6  --   --   --   HGB 17.7* 19.4* 18.0* 18.3*  HCT 56.0* 57.0* 56.3* 57.0*  MCV 101.1*  --  102.0* 101.4*  PLT 253  --  244 217    Cardiac Enzymes: Recent Labs  Lab 10/31/17 1411 10/31/17 2014 11/01/17 0242  TROPONINI 0.58* 0.49* 0.50*    BNP: BNP (last 3 results) Recent Labs    10/31/17 1038  BNP 671.4*    ProBNP (last 3 results) No results for input(s): PROBNP in the last 8760 hours.   CBG: Recent Labs  Lab 10/31/17 2324 11/01/17 0615  GLUCAP 87 96    Coagulation Studies: Recent Labs    11/01/17 0242  LABPROT 14.3  INR 1.12     Imaging   Dg Chest Port 1 View  Result Date: 10/31/2017 CLINICAL DATA:  Acute onset of shortness of breath. Current history of COPD and CHF. EXAM: PORTABLE CHEST 1 VIEW COMPARISON:  09/06/2017, 01/12/2017 and earlier. FINDINGS: Cardiac silhouette markedly enlarged, unchanged. Interval development of moderate diffuse interstitial pulmonary edema since the most recent prior examination. No visible confluent airspace consolidation. The previously identified circumscribed mass in the LEFT lower lobe is much less conspicuous on the portable examination. IMPRESSION: Moderate CHF, with stable marked cardiomegaly and moderate diffuse interstitial pulmonary edema. Electronically Signed   By: Hulan Saas M.D.   On: 10/31/2017 10:27      Medications:     Current Medications: . DULoxetine  60 mg Oral Daily  . enoxaparin (LOVENOX) injection  0.5 mg/kg Subcutaneous Q24H  . furosemide  40 mg Intravenous BID  . insulin aspart  0-15 Units Subcutaneous TID WC  . insulin aspart  0-5 Units Subcutaneous QHS  .  magnesium oxide  400 mg Oral Daily  . pantoprazole  40 mg Oral Daily  . pravastatin  20 mg Oral q1800  . pregabalin  225 mg Oral Daily  . sacubitril-valsartan  1 tablet Oral BID  . sodium chloride flush  3 mL Intravenous Q12H  . spironolactone  25 mg Oral Daily     Infusions:     Patient Profile   Jaelyn Cloninger is a 57 y.o. male with CHF (Unclear EF or etiology at this point), DM2, and HLD.   Pt presented to [facility] 10/31/17 with extreme dyspnea, confusion and acute hypoxic respiratory failure.   Assessment/Plan   1. Acute CHF - Unclear etiology, (EF at least <35% with ICD) - Echo 11/01/17 LVEF 15-20%, Grade 2 DD, Mild MR, Mod LAE, Severe RV failure, Mild RAE, Mild TR.  - States he had a cath in the past with a "blockage" that was "fixed" - s/p Medtronic ICD in 2018 - NYHA III-IIIb at home.  - Volume status elevated on exam.  - Increase lasix to 80 mg BID.  - Continune Entresto 49/51 mg BID - Start digoxin 0.125 mg daily.  - Stop spironolactone with h/o gynecomastia. He states this was previously stopped. Will use eplerenone 25 mg daily.  - Start Bidil 0.5 mg TID.  - No BB for now with concerns for low output.  - Will need HF education and likely paramedicine. - Concern for low output. Pt may be heading towards advanced therapies. He seems to have more than adequate social support, but major concerns raised concerning his health literacy and ability to self care.  - Will plan [facility] for tomorrow.   2. Acute hypoxic respiratory failure - Remains on O2 via [place] at 3 lpm.  - Pt states he has had two NEGATIVE sleep studies. - Reports history of COPD, never smoker.   3. DM2 - Per primary - Hgb A1C 10.4  4. HLP - LDL 98. - Will switch to atorvastatin 20 mg daily.   5. AKI - Unclear baseline  6. Elevated troponin - Likely demand ischemia 2/2 to CHF exacerbation.  Medication concerns reviewed with patient and pharmacy team. Barriers identified: Compliance, medical literacy.    Length of Stay: 1  Luane School  11/01/2017, 9:44 AM  Advanced Heart Failure Team Pager [phone] (M-F; 7a - 4p)  Please contact [facility] for night-coverage after hours (4p -7a ) and weekends on [email or web address]  Patient with history of cardiomyopathy of uncertain etiology, nonobstructive CAD, diabetes, never-smoker with COPD from industrial exposure (won a lawsuit) was admitted with acute on chronic systolic CHF.  He has questionable compliance with meds and appears to follow a high sodium diet.  He developed worsening exertional dyspnea 2-3 days ago.   On exam, JVP difficult but appears elevated.  Regular S1S2.  1+ edema to knees bilaterally.   1. Acute on chronic systolic CHF: Nonischemic cardiomyopathy.  Echo in 2017 with EF 15-20%.  Medtronic ICD. Echo this admission with EF 15-20%, severely dilated RV with severely decreased systolic function.  He is volume overloaded on exam.  - Increase Lasix to 80 mg IV bid.  - Continue Entresto 49/51 bid.  - Gynecomastia with spironolactone, do not have eplerenone in hospital.  -  Add Bidil 1/2 tab tid.  - Agree with addition of digoxin 0.125.  I do have some concern for low output HF.  - RHC/LHC tomorrow to assess filling pressures (JVP difficult to assess) and assess for low output. I discussed risks/benefits with patient and he agrees to proceed.  - Eventually would like to add Coreg.  2. COPD: Uses oxygen at night at home.  Never smoked, but apparently had significant occupational exposure.  It appears that he won a lawsuit dealing with the occupational exposure-related COPD.  3. CAD: Cath in 5/17 with 60% RCA stenosis.  Troponin 0.5 this admit without chest pain (just exertional dyspnea).   - ASA 81 daily.  - transition statin to atorvastatin.  - Will reassess coronaries with cath tomorrow.   Marca Ancona 11/01/2017 1:41 PM

## 2017-11-01 NOTE — Progress Notes (Signed)
  Date: 11/01/2017  Patient name: Abhi Quint  Medical record number: [id number]  Date of birth: August 08, 1960   I have seen and evaluated Nechama Guard and discussed their care with the Residency Team. Mr Leong is a 57 yo man with reported (no records available) nonischemic cardiomyopathy, ICD, oxygen dependence, COPD, CAD, CKD, and DM. He has had increasing DOE for one week and orthopnea for 2 nights although he has not been able to lay flat for 8 months. EMS was called and he was hypoxic to the 70%'s. He temp required NRB and BiPAP. He was started on lasix and NTG and heparin gtt. He was able to be transitioned off the BiPAP, NTG, and heparin.   PMHx, Fam Hx, and/or Soc Hx : Lives in [place]. Married, wife works third shift, he manages all of his meds  Vitals:   11/01/17 0745 11/01/17 1118  BP: (!) 140/96 122/86  Pulse: 92 88  Resp: (!) 21 (!) 23  Temp: 98.2 F (36.8 C) 98.2 F (36.8 C)  SpO2: 94% 96%  I/O net -1.2 L since admission Sitting in recliner, NAD, speaking in full sentences HRRR no MRG L crackles in bases with good air flow Ext + 1 edema B, changes c/w CVI  Cr stable 1.3 CO2 38 T bili 2.1 Indirect bili 1.7 Trop about 0.5 HgB 18 Plts 217 A1C 10  I personally viewed the CXR images and confirmed my reading with the official read. 1 view, AP, rotated, ICD L chest, pul edema  I personally viewed the EKG and confirmed my reading with the official read. Sinus, indeterminate axis, LAH, no ischemic changes  ECHO ED 15-20%. Diffuse hypokinesis. LVH. Grade 2 D dysfxn. LA dilation. RV dilated and reduced systolic fxn. RAE.   Assessment and Plan: I have seen and evaluated the patient as outlined above. I agree with the formulated Assessment and Plan as detailed in the residents' note, with the following changes: Mr Sharrett is a 57 yo man with reported (no records available) nonischemic cardiomyopathy, ICD, oxygen dependence, COPD, CAD, CKD, and DM. Without outside records and the  pt not able to give concise information, it has been challenging to piece together his hx. He was admitted with HF exac and vol overload. He has diuresed but still has some volume still. Cards has reviewed his ECHO and plan R/L heart cath in AM.   1. Acute on presumed chronic systolic and diastolic HD - appreciate cards input. Lasix has been increased. He is on Entrestro. Cleda Daub stopped as cards able to get h/o that it caused gynecomastia. Dig was started. BB not yet started bc not euvolemic.   2. 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 But will need polycythemia vera R/U with JAK 2 and epo levels.   3. Elevated Indirect bili - I doubt he is hemolyzing but will check and LDH & peripheral smear. If no evidence of hemolysis, would leave meds or Gilbert's or another even less likely scenario.   4. Poor health care literacy - this is sig and will contribute to poor outcomes. DM education. See if a candidate for cardiac rehab at D/C in [facility]. Will need a great PCP to manage conditions as an outpt.   Gero Spain, MD 5/9/20191:07 PM

## 2017-11-01 NOTE — Progress Notes (Signed)
  Echocardiogram 2D Echocardiogram has been performed.  Richard [name] 11/01/2017, 10:27 AM

## 2017-11-01 NOTE — Consult Note (Addendum)
Advanced Heart Failure Team Consult Note   Primary Physician: Ananias Pilgrim, MD PCP-Cardiologist:  No primary care provider on file.  Reason for Consultation: Acute on chronic systolic congestive HF, unknown EF  HPI:    Richard [name] is seen today for evaluation of acute on chronic congestive HF, unknown EF (At least < 35% with presence of ICD) at the request of Dr. Rogelia Boga.   Richard [name] is a 57 y.o. male with chronic systolic CHF, ? ICM, HTN, DM2, and HLD.   Pt presented to [facility] 10/31/17 with severe DOE, confusion, and hypoxic respiratory failure requiring Bipap transitioned to O2 via [place]. CXR showed pulmonary edema and dual chamber ICD, + massive cardiomegaly. ABG with resp acidosis (pH 7.32, PCO2 70, pO2 90). Other pertinent labs on admission include K 4.4, Cr 1.37, BNP 671.4, Troponin peaked at 0.59 and trended down, WBC 5.7, Hgb 18.0, Hgb A1C 10.4.  Pt is a very poor historian. He does not know if he has sleep apnea. He denies ever having a "heart attack". States he had a cath in 2018 with a "blockage" that was fixed. Does not weigh daily, but thinks his baseline weight is near 260 lbs. Eats lots of canned soup and adds salt to his food. He has highly questionable compliance with his medications. He doesn't know any of the names of his meds.  He lives at home with his wife, and at least 4 of his 10 kids, the others filter in and out. He get SOB with bathing and changing clothes.   By ICD interrogation Medtronic evera XT DR device implanted January 11, 2017. Implanted for "primary prevention". Has nevere received shock or had Afib detected. No Atrial or V-pacing. Activity level 1-2 hours a day. Optivol has been elevated since October 2018.  Echo pending.  LHC from 5/17: 60% stenosis proximal RCA, FFR 0.8.   Echo from 5/17: EF 15-20% with normal RV.   Review of systems complete and found to be negative unless listed in HPI.    Home Medications Prior to Admission medications     Medication Sig Start Date End Date Taking? Authorizing Provider  allopurinol (ZYLOPRIM) 100 MG tablet Take 100 mg by mouth daily. 08/11/17  Yes [provider]  BREO ELLIPTA 100-25 MCG/INH AEPB Inhale 1 puff into the lungs daily. 10/28/17  Yes [provider]  DULoxetine (CYMBALTA) 60 MG capsule Take 60 mg by mouth daily. 09/28/17  Yes [provider]  ENTRESTO 49-51 MG Take 1 tablet by mouth 2 (two) times daily. 10/08/17  Yes [provider]  gemfibrozil (LOPID) 600 MG tablet Take 600 mg by mouth 2 (two) times daily. 08/11/17  Yes [provider]  glipiZIDE (GLUCOTROL XL) 10 MG 24 hr tablet Take 10 mg by mouth 2 (two) times daily. 09/18/17  Yes [provider]  HUMULIN R U-500 KWIKPEN 500 UNIT/ML kwikpen Inject into the skin See admin instructions. Sliding scale as directed max daily dose 400 units 10/17/17  Yes [provider]  LYRICA 225 MG capsule Take 225 mg by mouth daily. 10/01/17  Yes [provider]  MAGNESIUM-OXIDE 400 (241.3 Mg) MG tablet Take 400 mg by mouth daily. 10/28/17  Yes [provider]  metoprolol succinate (TOPROL-XL) 25 MG 24 hr tablet Take 25 mg by mouth daily. 10/04/17  Yes [provider]  omeprazole (PRILOSEC) 20 MG capsule Take 20 mg by mouth daily. 10/04/17  Yes [provider]  pravastatin (PRAVACHOL) 20 MG tablet Take 20  mg by mouth daily. 09/06/17  Yes [provider]  torsemide (DEMADEX) 20 MG tablet Take 20 mg by mouth daily. 10/08/17  Yes [provider]  Vitamin D, Cholecalciferol, 1000 units CAPS Take 1,000 Units by mouth daily. 10/17/17  Yes [provider]    Past Medical History: Past Medical History:  Diagnosis Date  . CKD (chronic kidney disease)   . COPD (chronic obstructive pulmonary disease) (HCC)    never smoker, industrial exposure  . Diabetes mellitus with diabetic neuropathy, with long-term current use of insulin (HCC)   . GERD  (gastroesophageal reflux disease)   . Hypertension   . ICD (implantable cardioverter-defibrillator) in place   . Nonobstructive atherosclerosis of coronary artery   . On home oxygen therapy   . 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HCC)    LLL, present for years  . Systolic HF (heart failure) (HCC)     Past Surgical History: Past Surgical History:  Procedure Laterality Date  . CATARACT EXTRACTION W/ INTRAOCULAR LENS IMPLANTW/ TRABECULECTOMY    . CIRCUMCISION    . EYE SURGERY    . ICD IMPLANT     Medtronic Dual Chamber 01/11/17  . MOUTH SURGERY      Family History: Family History  Problem Relation Age of Onset  . Hypertension Mother   . Diabetes Mother   . Hypertension Father   . Diabetes Father   . Diabetes Sister   . Diabetes Brother     Social History: Social History   Socioeconomic History  . Marital status: Married    Spouse name: Not on file  . Number of children: Not on file  . Years of education: Not on file  . Highest education level: Not on file  Occupational History  . Occupation: disability    Comment: stopped working in 2000 d/t occupational exposures  Social Needs  . Financial resource strain: Not on file  . Food insecurity:    Worry: Not on file    Inability: Not on file  . Transportation needs:    Medical: Not on file    Non-medical: Not on file  Tobacco Use  . Smoking status: Former Games developer  . Smokeless tobacco: Never Used  Substance and Sexual Activity  . 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 Drug use: Never  . Sexual activity: Not on file  Lifestyle  . Physical activity:    Days per week: Not on file    Minutes per session: Not on file  . Stress: Not on file  Relationships  . Social connections:    Talks on phone: Not on file    Gets together: Not on file    Attends religious service: Not on file    Active member of club or organization: Not on file    Attends meetings of clubs or  organizations: Not on file    Relationship status: Not on file  Other Topics Concern  . Not on file  Social History Narrative  . Not on file    Allergies:  Allergies  Allergen Reactions  . Codeine     Objective:    Vital Signs:   Temp:  [96.9 F (36.1 C)-98.8 F (37.1 C)] 98.2 F (36.8 C) (05/09 0745) Pulse Rate:  [42-106] 92 (05/09 0745) Resp:  [17-32] 21 (05/09 0745) BP: (114-182)/(87-127) 140/96 (05/09 0745) SpO2:  [90 %-100 %] 94 % (05/09 0745) Weight:  [271 lb 9.7 oz (123.2  kg)-280 lb (127 kg)] 271 lb 9.7 oz (123.2 kg) (05/09 0054) Last BM Date: 10/31/17  Weight change: Filed Weights   10/31/17 1900 11/01/17 0054  Weight: 280 lb (127 kg) 271 lb 9.7 oz (123.2 kg)    Intake/Output:   Intake/Output Summary (Last 24 hours) at 11/01/2017 0944 Last data filed at 11/01/2017 0527 Gross per 24 hour  Intake 727.45 ml  Output 1600 ml  Net -872.55 ml      Physical Exam    General:  Fatigued appearing. Tearful. No resp difficulty HEENT: Normal Neck: supple. JVP difficult to assess. Carotids 2+ bilat; no bruits. No lymphadenopathy or thyromegaly appreciated. Cor: PMI nondisplaced. Regular rate & rhythm. 1/6 apical holosystolic murmur. +S3 Lungs: Clear Abdomen: Soft, nontender, nondistended. No hepatosplenomegaly. No bruits or masses. Good bowel sounds. Extremities: No cyanosis, clubbing, rash, edema Neuro: Alert & orientedx3, cranial nerves grossly intact. moves all 4 extremities w/o difficulty. Affect pleasant   Telemetry   NSR 80-90s, personally reviewed.   EKG    NSR 93 bpm, QRS 127 ms, personally reviewed.   Labs   Basic Metabolic Panel: Recent Labs  Lab 10/31/17 1038 10/31/17 1053 10/31/17 1620 11/01/17 0242  NA 143 143  --  143  K 4.4 4.3  --  4.2  CL 99* 97*  --  95*  CO2 33*  --   --  38*  GLUCOSE 105* 101*  --  94  BUN 17 26*  --  15  CREATININE 1.37* 1.30* 1.37* 1.30*  CALCIUM 9.0  --   --  9.2    Liver Function Tests: Recent Labs    Lab 10/31/17 1038  AST 46*  ALT 53  ALKPHOS 88  BILITOT 2.1*  PROT 7.0  ALBUMIN 3.6   No results for input(s): LIPASE, AMYLASE in the last 168 hours. No results for input(s): AMMONIA in the last 168 hours.  CBC: Recent Labs  Lab 10/31/17 1038 10/31/17 1053 10/31/17 1620 11/01/17 0242  WBC 5.7  --  5.7 6.3  NEUTROABS 3.6  --   --   --   HGB 17.7* 19.4* 18.0* 18.3*  HCT 56.0* 57.0* 56.3* 57.0*  MCV 101.1*  --  102.0* 101.4*  PLT 253  --  244 217    Cardiac Enzymes: Recent Labs  Lab 10/31/17 1411 10/31/17 2014 11/01/17 0242  TROPONINI 0.58* 0.49* 0.50*    BNP: BNP (last 3 results) Recent Labs    10/31/17 1038  BNP 671.4*    ProBNP (last 3 results) No results for input(s): PROBNP in the last 8760 hours.   CBG: Recent Labs  Lab 10/31/17 2324 11/01/17 0615  GLUCAP 87 96    Coagulation Studies: Recent Labs    11/01/17 0242  LABPROT 14.3  INR 1.12     Imaging   Dg Chest Port 1 View  Result Date: 10/31/2017 CLINICAL DATA:  Acute onset of shortness of breath. Current history of COPD and CHF. EXAM: PORTABLE CHEST 1 VIEW COMPARISON:  09/06/2017, 01/12/2017 and earlier. FINDINGS: Cardiac silhouette markedly enlarged, unchanged. Interval development of moderate diffuse interstitial pulmonary edema since the most recent prior examination. No visible confluent airspace consolidation. The previously identified circumscribed mass in the LEFT lower lobe is much less conspicuous on the portable examination. IMPRESSION: Moderate CHF, with stable marked cardiomegaly and moderate diffuse interstitial pulmonary edema. Electronically Signed   By: Hulan Saas M.D.   On: 10/31/2017 10:27      Medications:     Current Medications: . DULoxetine  60 mg Oral Daily  . enoxaparin (LOVENOX) injection  0.5 mg/kg Subcutaneous Q24H  . furosemide  40 mg Intravenous BID  . insulin aspart  0-15 Units Subcutaneous TID WC  . insulin aspart  0-5 Units Subcutaneous QHS  .  magnesium oxide  400 mg Oral Daily  . pantoprazole  40 mg Oral Daily  . pravastatin  20 mg Oral q1800  . pregabalin  225 mg Oral Daily  . sacubitril-valsartan  1 tablet Oral BID  . sodium chloride flush  3 mL Intravenous Q12H  . spironolactone  25 mg Oral Daily     Infusions:     Patient Profile   Richard [name] is a 57 y.o. male with CHF (Unclear EF or etiology at this point), DM2, and HLD.   Pt presented to [facility] 10/31/17 with extreme dyspnea, confusion and acute hypoxic respiratory failure.   Assessment/Plan   1. Acute CHF - Unclear etiology, (EF at least <35% with ICD) - Echo 11/01/17 LVEF 15-20%, Grade 2 DD, Mild MR, Mod LAE, Severe RV failure, Mild RAE, Mild TR.  - States he had a cath in the past with a "blockage" that was "fixed" - s/p Medtronic ICD in 2018 - NYHA III-IIIb at home.  - Volume status elevated on exam.  - Increase lasix to 80 mg BID.  - Continune Entresto 49/51 mg BID - Start digoxin 0.125 mg daily.  - Stop spironolactone with h/o gynecomastia. He states this was previously stopped. Will use eplerenone 25 mg daily.  - Start Bidil 0.5 mg TID.  - No BB for now with concerns for low output.  - Will need HF education and likely paramedicine. - Concern for low output. Pt may be heading towards advanced therapies. He seems to have more than adequate social support, but major concerns raised concerning his health literacy and ability to self care.  - Will plan [facility] for tomorrow.   2. Acute hypoxic respiratory failure - Remains on O2 via [place] at 3 lpm.  - Pt states he has had two NEGATIVE sleep studies. - Reports history of COPD, never smoker.   3. DM2 - Per primary - Hgb A1C 10.4  4. HLP - LDL 98. - Will switch to atorvastatin 20 mg daily.   5. AKI - Unclear baseline  6. Elevated troponin - Likely demand ischemia 2/2 to CHF exacerbation.  Medication concerns reviewed with patient and pharmacy team. Barriers identified: Compliance, medical literacy.    Length of Stay: 1  Luane School  11/01/2017, 9:44 AM  Advanced Heart Failure Team Pager [phone] (M-F; 7a - 4p)  Please contact [facility] for night-coverage after hours (4p -7a ) and weekends on [email or web address]  Patient with history of cardiomyopathy of uncertain etiology, nonobstructive CAD, diabetes, never-smoker with COPD from industrial exposure (won a lawsuit) was admitted with acute on chronic systolic CHF.  He has questionable compliance with meds and appears to follow a high sodium diet.  He developed worsening exertional dyspnea 2-3 days ago.   On exam, JVP difficult but appears elevated.  Regular S1S2.  1+ edema to knees bilaterally.   1. Acute on chronic systolic CHF: Nonischemic cardiomyopathy.  Echo in 2017 with EF 15-20%.  Medtronic ICD. Echo this admission with EF 15-20%, severely dilated RV with severely decreased systolic function.  He is volume overloaded on exam.  - Increase Lasix to 80 mg IV bid.  - Continue Entresto 49/51 bid.  - Gynecomastia with spironolactone, do not have eplerenone in hospital.  -  Add Bidil 1/2 tab tid.  - Agree with addition of digoxin 0.125.  I do have some concern for low output HF.  - RHC/LHC tomorrow to assess filling pressures (JVP difficult to assess) and assess for low output. I discussed risks/benefits with patient and he agrees to proceed.  - Eventually would like to add Coreg.  2. COPD: Uses oxygen at night at home.  Never smoked, but apparently had significant occupational exposure.  It appears that he won a lawsuit dealing with the occupational exposure-related COPD.  3. CAD: Cath in 5/17 with 60% RCA stenosis.  Troponin 0.5 this admit without chest pain (just exertional dyspnea).   - ASA 81 daily.  - transition statin to atorvastatin.  - Will reassess coronaries with cath tomorrow.   Marca Ancona 11/01/2017 1:41 PM

## 2017-11-02 ENCOUNTER — Encounter (HOSPITAL_COMMUNITY): Payer: Self-pay | Admitting: Cardiology

## 2017-11-02 ENCOUNTER — Encounter (HOSPITAL_COMMUNITY)
Admission: EM | Disposition: A | Discharge status: Home Health | Payer: Self-pay | Source: Home / Self Care | Attending: Internal Medicine

## 2017-11-02 DIAGNOSIS — G4733 Obstructive sleep apnea (adult) (pediatric): Secondary | ICD-10-CM

## 2017-11-02 DIAGNOSIS — Z955 Presence of coronary angioplasty implant and graft: Secondary | ICD-10-CM

## 2017-11-02 DIAGNOSIS — I25758 Atherosclerosis of native coronary artery of transplanted heart with other forms of angina pectoris: Secondary | ICD-10-CM

## 2017-11-02 DIAGNOSIS — I509 Heart failure, unspecified: Secondary | ICD-10-CM

## 2017-11-02 DIAGNOSIS — I5043 Acute on chronic combined systolic (congestive) and diastolic (congestive) heart failure: Secondary | ICD-10-CM

## 2017-11-02 DIAGNOSIS — R0902 Hypoxemia: Secondary | ICD-10-CM

## 2017-11-02 DIAGNOSIS — I110 Hypertensive heart disease with heart failure: Secondary | ICD-10-CM

## 2017-11-02 DIAGNOSIS — F410 Panic disorder [episodic paroxysmal anxiety] without agoraphobia: Secondary | ICD-10-CM

## 2017-11-02 DIAGNOSIS — Z7982 Long term (current) use of aspirin: Secondary | ICD-10-CM

## 2017-11-02 DIAGNOSIS — Z9889 Other specified postprocedural states: Secondary | ICD-10-CM

## 2017-11-02 DIAGNOSIS — I5023 Acute on chronic systolic (congestive) heart failure: Secondary | ICD-10-CM

## 2017-11-02 DIAGNOSIS — I214 Non-ST elevation (NSTEMI) myocardial infarction: Secondary | ICD-10-CM

## 2017-11-02 DIAGNOSIS — Z7902 Long term (current) use of antithrombotics/antiplatelets: Secondary | ICD-10-CM

## 2017-11-02 DIAGNOSIS — E119 Type 2 diabetes mellitus without complications: Secondary | ICD-10-CM

## 2017-11-02 DIAGNOSIS — I2510 Atherosclerotic heart disease of native coronary artery without angina pectoris: Secondary | ICD-10-CM

## 2017-11-02 DIAGNOSIS — R0689 Other abnormalities of breathing: Secondary | ICD-10-CM

## 2017-11-02 DIAGNOSIS — I872 Venous insufficiency (chronic) (peripheral): Secondary | ICD-10-CM

## 2017-11-02 DIAGNOSIS — Z79899 Other long term (current) drug therapy: Secondary | ICD-10-CM

## 2017-11-02 HISTORY — PX: CORONARY STENT INTERVENTION: CATH118234

## 2017-11-02 HISTORY — PX: RIGHT/LEFT HEART CATH AND CORONARY ANGIOGRAPHY: CATH118266

## 2017-11-02 LAB — GLUCOSE, CAPILLARY
GLUCOSE-CAPILLARY: 289 mg/dL — AB (ref 65–99)
Glucose-Capillary: 246 mg/dL — ABNORMAL HIGH (ref 65–99)
Glucose-Capillary: 222 mg/dL — ABNORMAL HIGH (ref 65–99)
Glucose-Capillary: 252 mg/dL — ABNORMAL HIGH (ref 65–99)
Glucose-Capillary: 196 mg/dL — ABNORMAL HIGH (ref 65–99)

## 2017-11-02 LAB — CBC
HCT: 54.8 % — ABNORMAL HIGH (ref 39.0–52.0)
Hemoglobin: 17.7 g/dL — ABNORMAL HIGH (ref 13.0–17.0)
MCH: 32.5 pg (ref 26.0–34.0)
MCHC: 32.3 g/dL (ref 30.0–36.0)
MCV: 100.7 fL — ABNORMAL HIGH (ref 78.0–100.0)
PLATELETS: 218 10*3/uL (ref 150–400)
RBC: 5.44 MIL/uL (ref 4.22–5.81)
RDW: 14.9 % (ref 11.5–15.5)
WBC: 7.4 10*3/uL (ref 4.0–10.5)

## 2017-11-02 LAB — URINALYSIS, ROUTINE W REFLEX MICROSCOPIC
BACTERIA UA: NONE SEEN
Bilirubin Urine: NEGATIVE
GLUCOSE, UA: 50 mg/dL — AB
HGB URINE DIPSTICK: NEGATIVE
Ketones, ur: NEGATIVE mg/dL
Leukocytes, UA: NEGATIVE
Nitrite: NEGATIVE
Protein, ur: 30 mg/dL — AB
SPECIFIC GRAVITY, URINE: 1.012 (ref 1.005–1.030)
pH: 6 (ref 5.0–8.0)

## 2017-11-02 LAB — BASIC METABOLIC PANEL
Anion gap: 12 (ref 5–15)
BUN: 20 mg/dL (ref 6–20)
CO2: 35 mmol/L — ABNORMAL HIGH (ref 22–32)
CREATININE: 1.14 mg/dL (ref 0.61–1.24)
Calcium: 8.6 mg/dL — ABNORMAL LOW (ref 8.9–10.3)
Chloride: 91 mmol/L — ABNORMAL LOW (ref 101–111)
GFR calc Af Amer: >60 mL/min (ref >60)
Glucose, Bld: 220 mg/dL — ABNORMAL HIGH (ref 65–99)
Potassium: 3.9 mmol/L (ref 3.5–5.1)
Sodium: 138 mmol/L (ref 135–145)

## 2017-11-02 LAB — POCT I-STAT 3, VENOUS BLOOD GAS (G3P V)
ACID-BASE EXCESS: 9 mmol/L — AB (ref 0.0–2.0)
Acid-Base Excess: 9 mmol/L — ABNORMAL HIGH (ref 0.0–2.0)
Bicarbonate: 37.9 mmol/L — ABNORMAL HIGH (ref 20.0–28.0)
Bicarbonate: 38.3 mmol/L — ABNORMAL HIGH (ref 20.0–28.0)
O2 SAT: 66 %
O2 Saturation: 67 %
PH VEN: 7.349 (ref 7.250–7.430)
PH VEN: 7.354 (ref 7.250–7.430)
PO2 VEN: 38 mmHg (ref 32.0–45.0)
TCO2: 40 mmol/L — ABNORMAL HIGH (ref 22–32)
TCO2: 40 mmol/L — ABNORMAL HIGH (ref 22–32)
pCO2, Ven: 67.9 mmHg — ABNORMAL HIGH (ref 44.0–60.0)
pCO2, Ven: 69.4 mmHg — ABNORMAL HIGH (ref 44.0–60.0)
pO2, Ven: 38 mmHg (ref 32.0–45.0)

## 2017-11-02 LAB — HEPATIC FUNCTION PANEL
ALBUMIN: 3.4 g/dL — AB (ref 3.5–5.0)
ALT: 40 U/L (ref 17–63)
AST: 32 U/L (ref 15–41)
Alkaline Phosphatase: 82 U/L (ref 38–126)
Bilirubin, Direct: 0.4 mg/dL (ref 0.1–0.5)
Indirect Bilirubin: 1.8 mg/dL — ABNORMAL HIGH (ref 0.3–0.9)
Total Bilirubin: 2.2 mg/dL — ABNORMAL HIGH (ref 0.3–1.2)
Total Protein: 7 g/dL (ref 6.5–8.1)

## 2017-11-02 LAB — ERYTHROPOIETIN: Erythropoietin: 29.3 m[IU]/mL — ABNORMAL HIGH (ref 2.6–18.5)

## 2017-11-02 LAB — POCT ACTIVATED CLOTTING TIME
ACTIVATED CLOTTING TIME: 246 s
Activated Clotting Time: 274 seconds

## 2017-11-02 SURGERY — RIGHT/LEFT HEART CATH AND CORONARY ANGIOGRAPHY
Anesthesia: LOCAL

## 2017-11-02 MED ORDER — VERAPAMIL HCL 2.5 MG/ML IV SOLN
INTRAVENOUS | Status: AC
  Filled 2017-11-02: qty 2

## 2017-11-02 MED ORDER — MELATONIN 3 MG PO TABS
3.0000 mg | ORAL_TABLET | Freq: Once | ORAL | Status: AC
  Administered 2017-11-02: 3 mg via ORAL
  Filled 2017-11-02: qty 1

## 2017-11-02 MED ORDER — SODIUM CHLORIDE 0.9% FLUSH
3.0000 mL | Freq: Two times a day (BID) | INTRAVENOUS | Status: DC
  Administered 2017-11-02 – 2017-11-06 (×7): 3 mL via INTRAVENOUS

## 2017-11-02 MED ORDER — HEPARIN SODIUM (PORCINE) 1000 UNIT/ML IJ SOLN
INTRAMUSCULAR | Status: AC
  Filled 2017-11-02: qty 1

## 2017-11-02 MED ORDER — FENTANYL CITRATE (PF) 100 MCG/2ML IJ SOLN
INTRAMUSCULAR | Status: AC
  Filled 2017-11-02: qty 2

## 2017-11-02 MED ORDER — MIDAZOLAM HCL 2 MG/2ML IJ SOLN
INTRAMUSCULAR | Status: DC | PRN
  Administered 2017-11-02 (×2): 1 mg via INTRAVENOUS

## 2017-11-02 MED ORDER — IOHEXOL 350 MG/ML SOLN
INTRAVENOUS | Status: DC | PRN
  Administered 2017-11-02: 100 mL via INTRA_ARTERIAL

## 2017-11-02 MED ORDER — HYDRALAZINE HCL 20 MG/ML IJ SOLN: 5.0000 mg | INTRAMUSCULAR | Status: AC | PRN

## 2017-11-02 MED ORDER — HEPARIN (PORCINE) IN NACL 2-0.9 UNITS/ML
INTRAMUSCULAR | Status: AC | PRN
  Administered 2017-11-02 (×2): 500 mL via INTRA_ARTERIAL

## 2017-11-02 MED ORDER — TIROFIBAN HCL IN NACL 5-0.9 MG/100ML-% IV SOLN
INTRAVENOUS | Status: AC
  Filled 2017-11-02: qty 100

## 2017-11-02 MED ORDER — FUROSEMIDE 10 MG/ML IJ SOLN
80.0000 mg | Freq: Two times a day (BID) | INTRAMUSCULAR | Status: DC
  Administered 2017-11-03 – 2017-11-05 (×5): 80 mg via INTRAVENOUS
  Filled 2017-11-02 (×6): qty 8

## 2017-11-02 MED ORDER — HEPARIN SODIUM (PORCINE) 1000 UNIT/ML IJ SOLN
INTRAMUSCULAR | Status: DC | PRN
  Administered 2017-11-02: 2500 [IU] via INTRAVENOUS
  Administered 2017-11-02: 5000 [IU] via INTRAVENOUS
  Administered 2017-11-02: 6000 [IU] via INTRAVENOUS
  Administered 2017-11-02: 2000 [IU] via INTRAVENOUS

## 2017-11-02 MED ORDER — CLOPIDOGREL BISULFATE 75 MG PO TABS
75.0000 mg | ORAL_TABLET | Freq: Every day | ORAL | Status: DC
  Administered 2017-11-03 – 2017-11-06 (×4): 75 mg via ORAL
  Filled 2017-11-02 (×4): qty 1

## 2017-11-02 MED ORDER — IOHEXOL 350 MG/ML SOLN
INTRAVENOUS | Status: DC | PRN
  Administered 2017-11-02: 35 mL via INTRA_ARTERIAL

## 2017-11-02 MED ORDER — SODIUM CHLORIDE 0.9 % IV SOLN: 250.0000 mL | INTRAVENOUS | Status: DC | PRN

## 2017-11-02 MED ORDER — TIROFIBAN (AGGRASTAT) BOLUS VIA INFUSION
INTRAVENOUS | Status: DC | PRN
  Administered 2017-11-02: 3067.5 ug via INTRAVENOUS

## 2017-11-02 MED ORDER — SODIUM CHLORIDE 0.9% FLUSH: 3.0000 mL | INTRAVENOUS | Status: DC | PRN

## 2017-11-02 MED ORDER — SODIUM CHLORIDE 0.9 % WEIGHT BASED INFUSION
0.4000 mL/kg/h | INTRAVENOUS | Status: AC
  Administered 2017-11-02: 10:00:00 0.4 mL/kg/h via INTRAVENOUS

## 2017-11-02 MED ORDER — CLOPIDOGREL BISULFATE 300 MG PO TABS
ORAL_TABLET | ORAL | Status: DC | PRN
  Administered 2017-11-02: 600 mg via ORAL

## 2017-11-02 MED ORDER — CLOPIDOGREL BISULFATE 300 MG PO TABS
ORAL_TABLET | ORAL | Status: AC
  Filled 2017-11-02: qty 1

## 2017-11-02 MED ORDER — ATORVASTATIN CALCIUM 40 MG PO TABS
40.0000 mg | ORAL_TABLET | Freq: Every day | ORAL | Status: DC
  Administered 2017-11-03 – 2017-11-05 (×3): 40 mg via ORAL
  Filled 2017-11-02 (×3): qty 1

## 2017-11-02 MED ORDER — ACETAMINOPHEN 325 MG PO TABS: 650.0000 mg | ORAL_TABLET | ORAL | Status: DC | PRN

## 2017-11-02 MED ORDER — HEPARIN (PORCINE) IN NACL 1000-0.9 UT/500ML-% IV SOLN
INTRAVENOUS | Status: AC
  Filled 2017-11-02: qty 1000

## 2017-11-02 MED ORDER — ISOSORB DINITRATE-HYDRALAZINE 20-37.5 MG PO TABS
1.0000 | ORAL_TABLET | Freq: Three times a day (TID) | ORAL | Status: DC
  Administered 2017-11-02 – 2017-11-05 (×10): 1 via ORAL
  Filled 2017-11-02 (×12): qty 1

## 2017-11-02 MED ORDER — FENTANYL CITRATE (PF) 100 MCG/2ML IJ SOLN
INTRAMUSCULAR | Status: DC | PRN
  Administered 2017-11-02 (×2): 25 ug via INTRAVENOUS

## 2017-11-02 MED ORDER — LIDOCAINE HCL (PF) 1 % IJ SOLN
INTRAMUSCULAR | Status: DC | PRN
  Administered 2017-11-02: 5 mL

## 2017-11-02 MED ORDER — ENOXAPARIN SODIUM 40 MG/0.4ML ~~LOC~~ SOLN: 40.0000 mg | SUBCUTANEOUS | Status: DC

## 2017-11-02 MED ORDER — RAMELTEON 8 MG PO TABS
8.0000 mg | ORAL_TABLET | Freq: Every day | ORAL | Status: DC
  Administered 2017-11-02 – 2017-11-05 (×4): 8 mg via ORAL
  Filled 2017-11-02 (×4): qty 1

## 2017-11-02 MED ORDER — MIDAZOLAM HCL 2 MG/2ML IJ SOLN
INTRAMUSCULAR | Status: AC
  Filled 2017-11-02: qty 2

## 2017-11-02 MED ORDER — LIDOCAINE HCL (PF) 1 % IJ SOLN
INTRAMUSCULAR | Status: AC
  Filled 2017-11-02: qty 30

## 2017-11-02 MED ORDER — LABETALOL HCL 5 MG/ML IV SOLN: 10.0000 mg | INTRAVENOUS | Status: AC | PRN

## 2017-11-02 MED ORDER — VERAPAMIL HCL 2.5 MG/ML IV SOLN
INTRAVENOUS | Status: DC | PRN
  Administered 2017-11-02: 10 mL via INTRA_ARTERIAL

## 2017-11-02 MED ORDER — ATORVASTATIN CALCIUM 80 MG PO TABS: 80.0000 mg | ORAL_TABLET | Freq: Every day | ORAL | Status: DC

## 2017-11-02 MED ORDER — ONDANSETRON HCL 4 MG/2ML IJ SOLN: 4.0000 mg | Freq: Four times a day (QID) | INTRAMUSCULAR | Status: DC | PRN

## 2017-11-02 SURGICAL SUPPLY — 20 items
CATH BALLN WEDGE 5F 110CM (CATHETERS) ×2 IMPLANT
CATH INFINITI 5 FR JL3.5 (CATHETERS) ×2 IMPLANT
CATH INFINITI 5F JL4 125CM (CATHETERS) ×2 IMPLANT
CATH INFINITI 5F PIG 125CM (CATHETERS) ×2 IMPLANT
CATH INFINITI JR4 5F (CATHETERS) ×2 IMPLANT
CATH VISTA GUIDE 6FR JR4 (CATHETERS) ×2 IMPLANT
DEVICE RAD COMP TR BAND LRG (VASCULAR PRODUCTS) ×2 IMPLANT
GLIDESHEATH SLEND SS 6F .021 (SHEATH) ×2 IMPLANT
GUIDEWIRE INQWIRE 1.5J.035X260 (WIRE) ×1 IMPLANT
INQWIRE 1.5J .035X260CM (WIRE) ×2
KIT ENCORE 26 ADVANTAGE (KITS) ×2 IMPLANT
KIT HEART LEFT (KITS) ×2 IMPLANT
KIT HEMO VALVE WATCHDOG (MISCELLANEOUS) ×2 IMPLANT
PACK CARDIAC CATHETERIZATION (CUSTOM PROCEDURE TRAY) ×2 IMPLANT
SHEATH RAIN 4/5FR (SHEATH) ×2 IMPLANT
STENT RESOLUTE ONYX 4.0X18 (Permanent Stent) ×2 IMPLANT
SYR CONTROL 10ML ANGIOGRAPHIC (SYRINGE) ×2 IMPLANT
TRANSDUCER W/STOPCOCK (MISCELLANEOUS) ×2 IMPLANT
TUBING CIL FLEX 10 FLL-RA (TUBING) ×2 IMPLANT
WIRE ASAHI PROWATER 180CM (WIRE) ×2 IMPLANT

## 2017-11-02 NOTE — Progress Notes (Signed)
Received call from secretary that patient would not be coming back to 4 E room 27. Clothing/Belongings taken to nurses desk.

## 2017-11-02 NOTE — Progress Notes (Signed)
Spoke with patient about his diabetes. States that he was diagnosed about 5-6 years ago. States that he sees Dr. Shiela Mayer as his PCP in [place], [place] with [facility]. Patient states that he takes U-500 insulin per sliding scale at bedtime and before breakfast. Does not know the sliding scale at this time.  Will attempt to call PCP office to find out what  insulin regimen has been prescribed. Patient just had a cardiac cath this am.   Recommend continuing Novolog MODERATE correction scale TID & HS. Will continue to monitor blood sugars while in the hospital.   Smith Mince RN BSN CDE Diabetes Coordinator Pager: [phone]  8am-5pm

## 2017-11-02 NOTE — Interval H&P Note (Signed)
History and Physical Interval Note:  11/02/2017 8:30 AM  Richard [name]  has presented today for surgery, with the diagnosis of HF  The various methods of treatment have been discussed with the patient and family. After consideration of risks, benefits and other options for treatment, the patient has consented to  Procedure(s): RIGHT/LEFT HEART CATH AND CORONARY ANGIOGRAPHY (N/A) as a surgical intervention .  The patient's history has been reviewed, patient examined, no change in status, stable for surgery.  I have reviewed the patient's chart and labs.  Questions were answered to the patient's satisfaction.     [place] Schoene [employer]

## 2017-11-02 NOTE — Progress Notes (Addendum)
Advanced Heart Failure Rounding Note  PCP-Cardiologist: No primary care provider on file.   Subjective:    Patient diuresed well yesterday.  Still some dyspnea with exertion.  No chest pain.   LHC/RHC (5/10):  Coronary Findings  Diagnostic  Dominance: Right  Left Main  No significant disease.  Left Anterior Descending  80% distal LAD stenosis.  Ramus Intermedius  Moderate ramus with luminal irregularities.  Left Circumflex  Luminal irregularities.  Right Coronary Artery  Focal 80% stenosis proximal RCA.  Intervention   No interventions have been documented.  Right Heart   Right Heart Pressures RHC Procedural Findings: Hemodynamics (mmHg)  RA 13 RV 67/18 PA 69/31, mean 47 PCWP mean 24 LV 128/33 AO 117/85  Oxygen saturations: PA 66% AO 98%  Cardiac Output (Fick) 4.8  Cardiac Index (Fick) 1.91 PVR 4.8 WU      Objective:   Weight Range: 270 lb 8.1 oz (122.7 kg) Body mass index is 32.08 kg/m.   Vital Signs:   Temp:  [97.7 F (36.5 C)-98.5 F (36.9 C)] 98.2 F (36.8 C) (05/10 0754) Pulse Rate:  [84-96] 91 (05/10 0754) Resp:  [23-29] 29 (05/10 0754) BP: (116-129)/(86-97) 123/95 (05/10 0754) SpO2:  [93 %-96 %] 96 % (05/10 0830) Weight:  [270 lb 8.1 oz (122.7 kg)] 270 lb 8.1 oz (122.7 kg) (05/10 0304) Last BM Date: 10/31/17  Weight change: Filed Weights   10/31/17 1900 11/01/17 0054 11/02/17 0304  Weight: 280 lb (127 kg) 271 lb 9.7 oz (123.2 kg) 270 lb 8.1 oz (122.7 kg)    Intake/Output:   Intake/Output Summary (Last 24 hours) at 11/02/2017 0926 Last data filed at 11/02/2017 0800 Gross per 24 hour  Intake 707 ml  Output 3500 ml  Net -2793 ml      Physical Exam    General:  Well appearing. No resp difficulty HEENT: Normal Neck: Supple. JVP 12-14 cm. Carotids 2+ bilat; no bruits. No lymphadenopathy or thyromegaly appreciated. Cor: PMI nondisplaced. Regular rate & rhythm. No rubs, gallops or murmurs. Lungs: Clear Abdomen: Soft,  nontender, nondistended. No hepatosplenomegaly. No bruits or masses. Good bowel sounds. Extremities: No cyanosis, clubbing, rash. 1+ ankle edema.  Neuro: Alert & orientedx3, cranial nerves grossly intact. moves all 4 extremities w/o difficulty. Affect pleasant   Telemetry   NSR, personally reviewed.   Labs    CBC Recent Labs    10/31/17 1038  11/01/17 0242 11/02/17 0333  WBC 5.7   < > 6.3 7.4  NEUTROABS 3.6  --   --   --   HGB 17.7*   < > 18.3* 17.7*  HCT 56.0*   < > 57.0* 54.8*  MCV 101.1*   < > 101.4* 100.7*  PLT 253   < > 217 218   < > = values in this interval not displayed.   Basic Metabolic Panel Recent Labs    16/10/96 0242 11/02/17 0333  NA 143 138  K 4.2 3.9  CL 95* 91*  CO2 38* 35*  GLUCOSE 94 220*  BUN 15 20  CREATININE 1.30* 1.14  CALCIUM 9.2 8.6*   Liver Function Tests Recent Labs    10/31/17 1038 11/01/17 0242  AST 46* 40  ALT 53 52  ALKPHOS 88 87  BILITOT 2.1* 2.1*  PROT 7.0 7.6  ALBUMIN 3.6 3.8   No results for input(s): LIPASE, AMYLASE in the last 72 hours. Cardiac Enzymes Recent Labs    10/31/17 1411 10/31/17 2014 11/01/17 0242  TROPONINI 0.58* 0.49* 0.50*  BNP: BNP (last 3 results) Recent Labs    10/31/17 1038  BNP 671.4*    ProBNP (last 3 results) No results for input(s): PROBNP in the last 8760 hours.   D-Dimer No results for input(s): DDIMER in the last 72 hours. Hemoglobin A1C Recent Labs    10/31/17 1620  HGBA1C 10.4*   Fasting Lipid Panel Recent Labs    11/01/17 0242  CHOL 148  HDL 33*  LDLCALC 98  TRIG 83  CHOLHDL 4.5   Thyroid Function Tests No results for input(s): TSH, T4TOTAL, T3FREE, THYROIDAB in the last 72 hours.  Invalid input(s): FREET3  Other results:   Imaging     No results found.   Medications:     Scheduled Medications: . [MAR Hold] allopurinol  100 mg Oral Daily  . [MAR Hold] aspirin EC  81 mg Oral Daily  . [MAR Hold] atorvastatin  20 mg Oral q1800  . [MAR Hold]  digoxin  0.125 mg Oral Daily  . [MAR Hold] DULoxetine  60 mg Oral Daily  . [MAR Hold] enoxaparin (LOVENOX) injection  0.5 mg/kg Subcutaneous Q24H  . [MAR Hold] eplerenone  25 mg Oral Daily  . [MAR Hold] furosemide  40 mg Intravenous BID  . [MAR Hold] insulin aspart  0-15 Units Subcutaneous TID WC  . [MAR Hold] insulin aspart  0-5 Units Subcutaneous QHS  . isosorbide-hydrALAZINE  1 tablet Oral TID  . [MAR Hold] magnesium oxide  400 mg Oral Daily  . [MAR Hold] mouth rinse  15 mL Mouth Rinse BID  . [MAR Hold] pantoprazole  40 mg Oral Daily  . [MAR Hold] pregabalin  225 mg Oral Daily  . [MAR Hold] sacubitril-valsartan  1 tablet Oral BID  . [MAR Hold] sodium chloride flush  3 mL Intravenous Q12H  . sodium chloride flush  3 mL Intravenous Q12H     Infusions: . sodium chloride    . sodium chloride 10 mL/hr at 11/02/17 0647  . heparin       PRN Medications:  sodium chloride, [MAR Hold] acetaminophen **OR** [MAR Hold] acetaminophen, fentaNYL, heparin, heparin, lidocaine (PF), midazolam, [MAR Hold] ondansetron **OR** [MAR Hold] ondansetron (ZOFRAN) IV, [MAR Hold] polyethylene glycol, Radial Cocktail/Verapamil only, sodium chloride flush   Assessment/Plan   1. Acute on chronic systolic CHF: Primarily nonischemic cardiomyopathy.  Echo in 2017 with EF 15-20%.  Medtronic ICD. Echo this admission with EF 15-20%, severely dilated RV with severely decreased systolic function.  LHC/RHC today showed ongoing volume overload with 80% RCA stenosis.  Cardiac index low at 1.91. The degree of coronary disease does not explain his cardiomyopathy.  He is volume overloaded on exam. He diuresed yesterday well with IV Lasix.  - Continue Lasix 80 mg IV bid, needs further diuresis.  - Continue Entresto 49/51 bid.  - Gynecomastia with spironolactone, do not have eplerenone in hospital.  - Can increase Bidil to 1 tab tid.  - Continue digoxin with low output. Hold on beta blocker for now.  - Eplerenone 25 daily  (gynecomastia with spironolactone).  2. COPD: Uses oxygen at night at home.  Never smoked, but apparently had significant occupational exposure.  It appears that he won a lawsuit dealing with the occupational exposure-related COPD.  3. CAD: Cath in 5/17 with 60% RCA stenosis.  Troponin 0.5 this admit without chest pain (just exertional dyspnea).  Cath this admission with 80-90% proximal RCA stenosis.  This did not cause his cardiomyopathy but is a large RCA and would benefit from opening.  He had PCI to RCA today with DES.  - ASA 81 daily.  - Continue atorvastatin, check lipids in am.   - Continue Plavix 75 mg daily.   If creatinine remains stable, will likely need diuresis over weekend.   Length of Stay: 2  Marca Ancona, MD  11/02/2017, 9:26 AM  Advanced Heart Failure Team Pager [phone] (M-F; 7a - 4p)  Please contact [facility] for night-coverage after hours (4p -7a ) and weekends on [email or web address]

## 2017-11-02 NOTE — Progress Notes (Signed)
  Date: 11/02/2017  Patient name: Richard [name]  Medical record number: [id number]  Date of birth: 09-19-1960   I have seen and evaluated this patient and I have discussed the plan of care with the house staff. Please see their note for complete details. I concur with their findings with the following additions/corrections: Mr Maggart was seen on AM rounds post cath. Cath showed cont volume overload. Laasix had not been increased yesterday from 40 IV BID so increased to 80 IV BID today. Net neg 2.9 l only. Cr tolerating diuresis. HF / cards has maximized HF meds - entresto, Bidil, eplerenone, Dig.  ' Pt's cath showed 80% RCA stenosis and got DES. On ASA, statin high intensity, and plavix. CAD does not explain his EF of 15% so mostly non ischemic HF.   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 Will need outpt sleep study and PFT's as hypoxia is most likely etiology.  Isolated indirect hyperbilirubinemia - cont to be elevated. LDH only minimally elevated and PRP low so R/O hemolysis. HF may be the cause of the bili elevation. No further W.U as long as remains asymptomatic.   Dinkins Spain, MD 11/02/2017, 1:56 PM

## 2017-11-02 NOTE — Progress Notes (Addendum)
Patient left room at this time to go to the cath lab. CCMD notified.

## 2017-11-02 NOTE — Care Management Note (Signed)
Case Management Note  Patient Details  Name: Richard [name] MRN: [id number] Date of Birth: Nov 13, 1960  Subjective/Objective:   From home, will need [facility] for CHF diseasse Management, he chose [facility] , referral given to [facility] with [facility] for [facility]. Soc will begin 24-48 hrs post dc.                   Action/Plan: DC home with HHRN at discharge.  Expected Discharge Date:                  Expected Discharge Plan:  Home w Home Health Services  In-House Referral:     Discharge planning Services  CM Consult  Post Acute Care Choice:  Home Health Choice offered to:  Patient  DME Arranged:    DME Agency:     HH Arranged:  RN, Disease Management HH Agency:  [facility]  Status of Service:  Completed, signed off  If discussed at Long Length of Stay Meetings, dates discussed:    Additional Comments:  Leone Haven, RN 11/02/2017, 3:44 PM

## 2017-11-02 NOTE — Progress Notes (Signed)
Subjective: Mr. Mccorkel was slightly anxious before his procedure in the catheterization lab, but was reassured that the lab would have anxiolytics on hand should he need them. He was lying flat in bed during my brief interview.  Post-procedure, he reports that he is feels well, though he was some what bothered by some bleeding at one of his IV sites. His breathing was fine and he denied any SOB.  Objective:  Vital signs in last 24 hours: Vitals:   11/02/17 1045 11/02/17 1130 11/02/17 1200 11/02/17 1300  BP: 122/87 (!) 122/94 (!) 133/108 (!) 126/96  Pulse: 93 93  93  Resp: (!) 29 (!) 22 (!) 25 (!) 21  Temp:      TempSrc:      SpO2: 96% 99%  97%  Weight:      Height:       Physical Exam  Constitutional: He is oriented to person, place, and time. He appears well-developed and well-nourished. No distress.  HENT:  Head: Normocephalic and atraumatic.  Eyes: Pupils are equal, round, and reactive to light.  Neck: JVD present.  Cardiovascular: Normal rate and regular rhythm.  Pulmonary/Chest: Effort normal.  Abdominal: Soft. Bowel sounds are normal. He exhibits distension. There is tenderness (LLQ).  Musculoskeletal: He exhibits edema (trace pedal edema b/l).  Neurological: He is alert and oriented to person, place, and time.  Skin: Skin is warm and dry. He is not diaphoretic.  Changes c/w venous stasis BLE  Psychiatric: He has a normal mood and affect. His behavior is normal. Judgment and thought content normal.   BMP Latest Ref Rng & Units 11/02/2017 11/01/2017 10/31/2017  Glucose 65 - 99 mg/dL 098(J) 94 -  BUN 6 - 20 mg/dL 20 15 -  Creatinine 1.91 - 1.24 mg/dL 4.78 2.95(A) 2.13(Y)  Sodium 135 - 145 mmol/L 138 143 -  Potassium 3.5 - 5.1 mmol/L 3.9 4.2 -  Chloride 101 - 111 mmol/L 91(L) 95(L) -  CO2 22 - 32 mmol/L 35(H) 38(H) -  Calcium 8.9 - 10.3 mg/dL 8.6(V) 9.2 -   .  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 Patient was placed on BiPAP in ED which we would like to avoid in the future as patient is chronic CO2 retainer.His arterial blood gas showed mild acidosis (pH 7.32),parameters consistent with chronic respiratory acidosis with metabolic compensation with superimposed acute hypercapnia(pCO2 70, pO2 90).Troponins were elevated to 0.58, likely elevated to demand ischemia. Per clinic notes, last EF was 20-25% (09/2016).  Home O2 requirement is 2L. Cardiology concerned for low output HF, catheterization revealed 80-90% stenosed RCA (DES placed) but no evidence of CAD cardiomyopathy. - Cardiology consulted, recs summarized below - Previously on spironolactone, d/c'd 2/2 gynecomastia; continue eplerenone 25 mg qd - Begin digoxin 0.125mg  qd - sch lasix increased from  40mg  to 80mg  IV BID 5/10 per cards - Continue home Entresto - Increase BiDil to 1 mg TID - Start BB when euvolemic; continue to diurese as Cr allows - Candidate for cardiac rehab at D/C? - DAPT: continue ASA 81mg  daily, start clopidogrel 75mg  daily - AM Lipid panel  #COPD:Never smoker, COPD 2/2 occupational exposure (legally compensated). Home regimen includes Breo inhaler qd. Home O2 requirement of 2L, currently requiring 3L.  #Hematologic Ab/ns: HgB 18.3 on  admission, chart review reveals HgB of 18.7 recorded in 01/12/2017. This is likely the result of longstanding multifactorial hypoxia (HF/COPD/?OSA), but we will rule out polycythemia vera during this admission. Bilirubin (indirect) elevated, potential for hemolysis. - JAK2 & EPO levels pending - Daily CBC, smear review pending - LDH 195, high-normal  DM: Home regimen includes insulin and glipizide. Patient would benefit from SGLT2 inhibitor in the future. - HbA1c 10.4 - Cr 1.37 on adm, decr to 1.14 - Urinalysis +glucose, +protein - SSI, home duloxetine and pregabalin  OSA: Patient may have sleep apnea as he mentions failure to wear CPAP in the past. He also reports two negative sleep studies. This is not documented in his HF clinic notes. Patient would likely benefit from sleep study and CPAP rx for long term HTN prevention.  VTE ZOX:[id number]  Dispo: Anticipated discharge in approximately 2-3 day(s). Health literacy may be patient's biggest barrier to healthy discharge; continue daily education.   Dow Adolph, Medical Student 11/02/2017, 1:35 PM Pager: [phone]  Attestation for Student Documentation:  I personally was present and performed or re-performed the history, physical exam and medical decision-making activities of this service and have verified that the service and findings are accurately documented in the student's note.  Arnetha Courser, MD 11/02/2017, 2:28 PM

## 2017-11-02 NOTE — Progress Notes (Signed)
Pt informed that he was having trouble getting to sleep and wanted something to help.  Dr. Alinda Money was paged and informed, which lead him to order a one time dose of Melatonin 3 mg tablet by mouth.  Pt received medication.  Will continue to monitor.  Harriet Masson, RN

## 2017-11-02 NOTE — Progress Notes (Signed)
Site area: right brachial   Site Prior to Removal:  Level 0  Pressure Applied For 15 MINUTES    Minutes Beginning at 1230  Manual:   Yes.    Patient Status During Pull:  Stable   Post Pull Groin Site:  Level 0  Post Pull Instructions Given:  Yes.    Post Pull Pulses Present:  Yes.    Dressing Applied:  Yes.    Comments:  Rechecked with no change in assessment   TR BAND REMOVAL  LOCATION:    right radial  DEFLATED PER PROTOCOL:    Yes.    TIME BAND OFF / DRESSING APPLIED:   1400  SITE UPON ARRIVAL:    Level 0  SITE AFTER BAND REMOVAL:    Level 0  CIRCULATION SENSATION AND MOVEMENT:    Within Normal Limits   Yes.    COMMENTS:  Rechecked with not change noted

## 2017-11-03 LAB — CBC
HCT: 51.6 % (ref 39.0–52.0)
Hemoglobin: 16.6 g/dL (ref 13.0–17.0)
MCH: 32.4 pg (ref 26.0–34.0)
MCHC: 32.2 g/dL (ref 30.0–36.0)
MCV: 100.8 fL — ABNORMAL HIGH (ref 78.0–100.0)
Platelets: 202 10*3/uL (ref 150–400)
RBC: 5.12 MIL/uL (ref 4.22–5.81)
RDW: 14.7 % (ref 11.5–15.5)
WBC: 8.2 10*3/uL (ref 4.0–10.5)

## 2017-11-03 LAB — GLUCOSE, CAPILLARY
GLUCOSE-CAPILLARY: 232 mg/dL — AB (ref 65–99)
GLUCOSE-CAPILLARY: 226 mg/dL — AB (ref 65–99)
Glucose-Capillary: 207 mg/dL — ABNORMAL HIGH (ref 65–99)
Glucose-Capillary: 236 mg/dL — ABNORMAL HIGH (ref 65–99)

## 2017-11-03 LAB — BASIC METABOLIC PANEL
Anion gap: 13 (ref 5–15)
BUN: 15 mg/dL (ref 6–20)
CO2: 33 mmol/L — ABNORMAL HIGH (ref 22–32)
Calcium: 8.6 mg/dL — ABNORMAL LOW (ref 8.9–10.3)
Chloride: 94 mmol/L — ABNORMAL LOW (ref 101–111)
Creatinine, Ser: 1.12 mg/dL (ref 0.61–1.24)
GFR calc Af Amer: >60 mL/min (ref >60)
GFR calc non Af Amer: >60 mL/min (ref >60)
Glucose, Bld: 202 mg/dL — ABNORMAL HIGH (ref 65–99)
Potassium: 4 mmol/L (ref 3.5–5.1)
Sodium: 140 mmol/L (ref 135–145)

## 2017-11-03 LAB — LIPID PANEL
CHOLESTEROL: 122 mg/dL (ref 0–200)
HDL: 35 mg/dL — ABNORMAL LOW (ref >40)
LDL Cholesterol: 69 mg/dL (ref 0–99)
TRIGLYCERIDES: 90 mg/dL (ref <150)
Total CHOL/HDL Ratio: 3.5 RATIO
VLDL: 18 mg/dL (ref 0–40)

## 2017-11-03 MED ORDER — ANGIOPLASTY BOOK
Freq: Once | Status: AC
  Administered 2017-11-03: 06:00:00
  Filled 2017-11-03: qty 1

## 2017-11-03 MED ORDER — INSULIN GLARGINE 100 UNIT/ML ~~LOC~~ SOLN
10.0000 [IU] | Freq: Every day | SUBCUTANEOUS | Status: DC
  Administered 2017-11-03: 10 [IU] via SUBCUTANEOUS
  Filled 2017-11-03: qty 0.1

## 2017-11-03 MED ORDER — INSULIN ASPART 100 UNIT/ML ~~LOC~~ SOLN
0.0000 [IU] | Freq: Three times a day (TID) | SUBCUTANEOUS | Status: DC
  Administered 2017-11-03 (×2): 7 [IU] via SUBCUTANEOUS
  Administered 2017-11-04: 11 [IU] via SUBCUTANEOUS
  Administered 2017-11-04 (×2): 7 [IU] via SUBCUTANEOUS
  Administered 2017-11-05: 4 [IU] via SUBCUTANEOUS
  Administered 2017-11-05: 11 [IU] via SUBCUTANEOUS
  Administered 2017-11-05: 4 [IU] via SUBCUTANEOUS
  Administered 2017-11-06 (×2): 7 [IU] via SUBCUTANEOUS

## 2017-11-03 NOTE — Progress Notes (Signed)
  Date: 11/03/2017  Patient name: Richard [name]  Medical record number: [id number]  Date of birth: [birth year]   I have seen and evaluated this patient and I have discussed the plan of care with the house staff. Please see their note for complete details. I concur with their findings with the following additions/corrections: Pt speaking with HF nurse. Had to sleep sitting up. Net -6.6 L and 14 lbs. Cr holding steady. Cont diuresis with IV lasix 80 BID. He needs a lot of dz education.  Resurreccion [place], MD 11/03/2017, 1:47 PM

## 2017-11-03 NOTE — Progress Notes (Addendum)
Advanced Heart Failure Rounding Note  PCP-Cardiologist: No primary care provider on file.   Subjective:    Remains on lasix 80 IV bid. I/Os seem incomplete but weight up 2 pounds. Renal function stable.  Denies SOB, orthopnea or PND.     LHC/RHC (5/10):  Coronary Findings  Diagnostic  Dominance: Right  Left Main  No significant disease.  Left Anterior Descending  80% distal LAD stenosis.  Ramus Intermedius  Moderate ramus with luminal irregularities.  Left Circumflex  Luminal irregularities.  Right Coronary Artery  Focal 80% stenosis proximal RCA.  Intervention   No interventions have been documented.  Right Heart   Right Heart Pressures RHC Procedural Findings: Hemodynamics (mmHg)  RA 13 RV 67/18 PA 69/31, mean 47 PCWP mean 24 LV 128/33 AO 117/85  Oxygen saturations: PA 66% AO 98%  Cardiac Output (Fick) 4.8  Cardiac Index (Fick) 1.91 PVR 4.8 WU      Objective:   Weight Range: 121 kg (266 lb 12.1 oz) Body mass index is 31.63 kg/m.   Vital Signs:   Temp:  [97.7 F (36.5 C)-98.3 F (36.8 C)] 98 F (36.7 C) (05/11 1125) Pulse Rate:  [42-92] 81 (05/11 1125) Resp:  [18-25] 23 (05/11 1125) BP: (105-140)/(75-104) 105/86 (05/11 1125) SpO2:  [94 %-100 %] 95 % (05/11 1125) Weight:  [121 kg (266 lb 12.1 oz)] 121 kg (266 lb 12.1 oz) (05/11 0325) Last BM Date: 11/02/17  Weight change: Filed Weights   11/01/17 0054 11/02/17 0304 11/03/17 0325  Weight: 123.2 kg (271 lb 9.7 oz) 122.7 kg (270 lb 8.1 oz) 121 kg (266 lb 12.1 oz)    Intake/Output:   Intake/Output Summary (Last 24 hours) at 11/03/2017 1316 Last data filed at 11/02/2017 2145 Gross per 24 hour  Intake 669.63 ml  Output 1500 ml  Net -830.37 ml      Physical Exam    General:  Sitting in chair. No resp difficulty HEENT: normal Neck: supple. JVP to jaw Carotids 2+ bilat; no bruits. No lymphadenopathy or thryomegaly appreciated. Cor: PMI nondisplaced. Regular rate & rhythm. No rubs,  gallops or murmurs. Lungs: clear Abdomen: obese soft, nontender, +distended. No hepatosplenomegaly. No bruits or masses. Good bowel sounds. Extremities: no cyanosis, clubbing, rash, 2+ edema. ulcer on left shin  Neuro: alert & orientedx3, cranial nerves grossly intact. moves all 4 extremities w/o difficulty. Affect pleasant  Telemetry   NSR 80-90s, personally reviewed.   Labs    CBC Recent Labs    11/02/17 0333 11/03/17 0316  WBC 7.4 8.2  HGB 17.7* 16.6  HCT 54.8* 51.6  MCV 100.7* 100.8*  PLT 218 202   Basic Metabolic Panel Recent Labs    96/04/54 0333 11/03/17 0316  NA 138 140  K 3.9 4.0  CL 91* 94*  CO2 35* 33*  GLUCOSE 220* 202*  BUN 20 15  CREATININE 1.14 1.12  CALCIUM 8.6* 8.6*   Liver Function Tests Recent Labs    11/01/17 0242 11/02/17 0333  AST 40 32  ALT 52 40  ALKPHOS 87 82  BILITOT 2.1* 2.2*  PROT 7.6 7.0  ALBUMIN 3.8 3.4*   No results for input(s): LIPASE, AMYLASE in the last 72 hours. Cardiac Enzymes Recent Labs    10/31/17 1411 10/31/17 2014 11/01/17 0242  TROPONINI 0.58* 0.49* 0.50*    BNP: BNP (last 3 results) Recent Labs    10/31/17 1038  BNP 671.4*    ProBNP (last 3 results) No results for input(s): PROBNP in the last  8760 hours.   D-Dimer No results for input(s): DDIMER in the last 72 hours. Hemoglobin A1C Recent Labs    10/31/17 1620  HGBA1C 10.4*   Fasting Lipid Panel Recent Labs    11/03/17 0316  CHOL 122  HDL 35*  LDLCALC 69  TRIG 90  CHOLHDL 3.5   Thyroid Function Tests No results for input(s): TSH, T4TOTAL, T3FREE, THYROIDAB in the last 72 hours.  Invalid input(s): FREET3  Other results:   Imaging    No results found.   Medications:     Scheduled Medications: . allopurinol  100 mg Oral Daily  . aspirin EC  81 mg Oral Daily  . atorvastatin  40 mg Oral q1800  . clopidogrel  75 mg Oral Q breakfast  . digoxin  0.125 mg Oral Daily  . DULoxetine  60 mg Oral Daily  . enoxaparin (LOVENOX)  injection  0.5 mg/kg Subcutaneous Q24H  . eplerenone  25 mg Oral Daily  . furosemide  80 mg Intravenous BID  . insulin aspart  0-20 Units Subcutaneous TID WC  . insulin aspart  0-5 Units Subcutaneous QHS  . insulin glargine  10 Units Subcutaneous QHS  . isosorbide-hydrALAZINE  1 tablet Oral TID  . magnesium oxide  400 mg Oral Daily  . mouth rinse  15 mL Mouth Rinse BID  . pantoprazole  40 mg Oral Daily  . pregabalin  225 mg Oral Daily  . ramelteon  8 mg Oral QHS  . sacubitril-valsartan  1 tablet Oral BID  . sodium chloride flush  3 mL Intravenous Q12H  . sodium chloride flush  3 mL Intravenous Q12H    Infusions: . sodium chloride      PRN Medications: sodium chloride, acetaminophen **OR** acetaminophen, acetaminophen, ondansetron **OR** ondansetron (ZOFRAN) IV, polyethylene glycol, sodium chloride flush   Assessment/Plan   1. Acute on chronic systolic CHF: Primarily nonischemic cardiomyopathy.  Echo in 2017 with EF 15-20%.  Medtronic ICD. Echo this admission with EF 15-20%, severely dilated RV with severely decreased systolic function.  LHC/RHC today showed ongoing volume overload with 80% RCA stenosis.  Cardiac index low at 1.91. The degree of coronary disease does not explain his cardiomyopathy.  He diuresed yesterday well with IV Lasix.  - Remains volume overloaded. Weight up overnight (different scale though from 6C to 2C) Continue Lasix 80 mg IV bid. Add metolazone 2.5 bid. Place TED hose.  - Continue Entresto 49/51 bid.  - Gynecomastia with spironolactone on eplerenone - Continue Bidil to 1 tab tid.  - Continue digoxin with low output. Hold on beta blocker for now.  - Eplerenone 25 daily (gynecomastia with spironolactone).  2. COPD: Uses oxygen at night at home.  Never smoked, but apparently had significant occupational exposure.  It appears that he won a lawsuit dealing with the occupational exposure-related COPD.  3. CAD: Cath in 5/17 with 60% RCA stenosis.  Troponin 0.5  this admit without chest pain (just exertional dyspnea).  Cath this admission with 80-90% proximal RCA stenosis with DES to RCA.  This did not cause his cardiomyopathy but is a large RCA. - ASA/Plavix and atorvastatin  4. Polycythemia - likely related to chronic hypoxia. Will need sleep study as outpatient 5. Hypokalemia - will supp   Length of Stay: 3  Arvilla Meres, MD  11/03/2017, 1:16 PM  Advanced Heart Failure Team Pager [phone] (M-F; 7a - 4p)  Please contact [facility] for night-coverage after hours (4p -7a ) and weekends on [email or web address]

## 2017-11-03 NOTE — Progress Notes (Signed)
   Subjective: Patient was sitting comfortably in chair talking with heart failure nurse when seen this morning.  He is still unable to lay flat in bed and was sleeping in recliner last night.  He was desaturating with ambulation and at rest on room air.  Saturating well on 2 L.  According to patient his breathing is fine and he denied any ongoing shortness of breath.  Objective:  Vital signs in last 24 hours: Vitals:   11/03/17 0325 11/03/17 0736 11/03/17 0900 11/03/17 0924  BP:  117/83    Pulse: 80 (!) 42    Resp: (!) 21 18 18  (!) 25  Temp: 98 F (36.7 C) 98.3 F (36.8 C)    TempSrc: Oral Oral    SpO2: 95% 94%    Weight: 266 lb 12.1 oz (121 kg)     Height:       General.  Well-developed, obese gentleman, in no acute distress. Lungs.  Mildly decreased breath sounds at bases. CV.  Regular rate and rhythm. Abdomen.  Soft, nontender, distended, bowel sounds positive. Extremities.  2+ pitting edema bilateral lower extremities left little worse or than the right.  Pulses intact and symmetrical.   Assessment/Plan:  #HTN,Acute on ChronicSystolic HF,NICMwith Medtronic ICD, s/p R&LHC: Doing well after right and left heart cath yesterday.  Continue to experience symptoms of heart failure with orthopnea. Heart failure team is following and we appreciate the recommendations. -Continue diuresing with Lasix 80 mg IV twice daily, as renal function continued to improve. -Continue digoxin 0.125 mg daily. -Continue home dose of Entresto. -Bidil dose was increased to 1 mg 3 times daily yesterday. -Continue eplerenone 25 mg daily. -He will continue aspirin and Plavix. -Beta-blocker will be added once euvolemic. -He will need 2 L of oxygen all the time. -He will benefit with cardiac rehab on discharge.  #COPD:Never smoker, COPD 2/2 occupational exposure (legally compensated). -He was saturating well on 2 to 3 L.  He was asked to wear his oxygen continuously which he was not doing  before.  #Hematologic Ab/ns:HgB 18.3 on admission, most likely secondary to chronic hypoxia and hypercarbia. -Erythropoietin levels mildly elevated at 29.3. -JAK2 mutation labs-pending.  DM: A1c 10.4, BG remained between 196-289 over the past 24-hour. -Discontinue moderate sliding scale. -Start resistant sliding scale. -Add Lantus 10 units at bedtime. -Continue home duloxetine and Lyrica.  OSA: Patient may have sleep apnea as he mentions failure to wear CPAP in the past -He will need a new sleep study as an outpatient.  Dispo: Anticipated discharge in approximately 1-2 day(s).   Arnetha Courser, MD 11/03/2017, 10:34 AM Pager: [phone]

## 2017-11-03 NOTE — Progress Notes (Signed)
CARDIAC REHAB PHASE I   PRE:  Rate/Rhythm: 36 Sr with rare PVC  BP:  Supine:   Sitting: 109/65  Standing:    SaO2: 81 RA 94 2L  MODE:  Ambulation: 500 ft   POST:  Rate/Rhythm: 81 SR  BP:  Supine:   Sitting: 121/96  Standing:    SaO2: 91 2L [id number] On arrival pt in recliner with oxygen off. Room air saturation 81%. Placed O2 2L back on saturation improved to 94%. Walked pt on 2L saturation during and at end of walk 91%.Completed CHF and stent education with pt. He needs much reinforcement with education. I gave him CHF booklet. We discussed signs and symptoms of CHF,when to call MD, 911, low sodium and diabetic diet,limiting fluids, daily weights and exercise guidelines. I explained to him that he needs to take his medications as prescribed especially the Plavix after having a stent placed. Will send referral to [facility] in [place] since this is the closest program to him. He will need close following at discharge.  Melina Copa RN 11/03/2017 9:44 AM

## 2017-11-04 LAB — CBC
HEMATOCRIT: 50.6 % (ref 39.0–52.0)
HEMOGLOBIN: 16.4 g/dL (ref 13.0–17.0)
MCH: 32.5 pg (ref 26.0–34.0)
MCHC: 32.4 g/dL (ref 30.0–36.0)
MCV: 100.4 fL — ABNORMAL HIGH (ref 78.0–100.0)
Platelets: 214 10*3/uL (ref 150–400)
RBC: 5.04 MIL/uL (ref 4.22–5.81)
RDW: 14.6 % (ref 11.5–15.5)
WBC: 6.4 10*3/uL (ref 4.0–10.5)

## 2017-11-04 LAB — BASIC METABOLIC PANEL
ANION GAP: 10 (ref 5–15)
BUN: 22 mg/dL — AB (ref 6–20)
CALCIUM: 8.6 mg/dL — AB (ref 8.9–10.3)
CO2: 35 mmol/L — ABNORMAL HIGH (ref 22–32)
CREATININE: 1.15 mg/dL (ref 0.61–1.24)
Chloride: 91 mmol/L — ABNORMAL LOW (ref 101–111)
GFR calc Af Amer: >60 mL/min (ref >60)
Glucose, Bld: 326 mg/dL — ABNORMAL HIGH (ref 65–99)
Potassium: 3.5 mmol/L (ref 3.5–5.1)
SODIUM: 136 mmol/L (ref 135–145)

## 2017-11-04 LAB — GLUCOSE, CAPILLARY
GLUCOSE-CAPILLARY: 201 mg/dL — AB (ref 65–99)
GLUCOSE-CAPILLARY: 209 mg/dL — AB (ref 65–99)
Glucose-Capillary: 275 mg/dL — ABNORMAL HIGH (ref 65–99)
Glucose-Capillary: 224 mg/dL — ABNORMAL HIGH (ref 65–99)

## 2017-11-04 MED ORDER — INSULIN GLARGINE 100 UNIT/ML ~~LOC~~ SOLN
20.0000 [IU] | Freq: Every day | SUBCUTANEOUS | Status: DC
  Administered 2017-11-04 – 2017-11-05 (×2): 20 [IU] via SUBCUTANEOUS
  Filled 2017-11-04 (×3): qty 0.2

## 2017-11-04 MED ORDER — ENOXAPARIN SODIUM 60 MG/0.6ML ~~LOC~~ SOLN
0.5000 mg/kg | SUBCUTANEOUS | Status: DC
  Administered 2017-11-04 – 2017-11-05 (×2): 60 mg via SUBCUTANEOUS
  Filled 2017-11-04 (×2): qty 0.6

## 2017-11-04 MED ORDER — POTASSIUM CHLORIDE CRYS ER 20 MEQ PO TBCR
40.0000 meq | EXTENDED_RELEASE_TABLET | Freq: Two times a day (BID) | ORAL | Status: DC
  Administered 2017-11-04 – 2017-11-06 (×5): 40 meq via ORAL
  Filled 2017-11-04 (×5): qty 2

## 2017-11-04 MED ORDER — METOLAZONE 2.5 MG PO TABS
2.5000 mg | ORAL_TABLET | Freq: Two times a day (BID) | ORAL | Status: DC
  Administered 2017-11-04: 2.5 mg via ORAL
  Filled 2017-11-04 (×2): qty 1

## 2017-11-04 NOTE — Progress Notes (Signed)
Pt requesting to ambulate.  Pt ambulated in hall x 400 feet. Oxygen saturation 90% on room air.  Pt refusing to wear oxygen at this time and requesting to ambulate without  oxygen as pt states "I feel fine and my oxygen is always low."  Pt oxygen saturation during ambulation decreased to 69% with ambulation.  Pt denies increased SOB with ambulation.  Pt returned to room and oxygen saturation increased to 79% on RA.  2L O2 applied with oxygen saturation increasing to 95%.

## 2017-11-04 NOTE — Progress Notes (Signed)
Advanced Heart Failure Rounding Note  PCP-Cardiologist: No primary care provider on file.   Subjective:    Continues to diurese on lasix 80 IV bid. Weight down another 4 pounds (14 pounds total).   Says breathing feels better. Still feels swollen. . No orthopnea or PND Renal function stable. K 4.0    LHC/RHC (5/10):  Coronary Findings  Diagnostic  Dominance: Right  Left Main  No significant disease.  Left Anterior Descending  80% distal LAD stenosis.  Ramus Intermedius  Moderate ramus with luminal irregularities.  Left Circumflex  Luminal irregularities.  Right Coronary Artery  Focal 80% stenosis proximal RCA.  Intervention   No interventions have been documented.  Right Heart   Right Heart Pressures RHC Procedural Findings: Hemodynamics (mmHg)  RA 13 RV 67/18 PA 69/31, mean 47 PCWP mean 24 LV 128/33 AO 117/85  Oxygen saturations: PA 66% AO 98%  Cardiac Output (Fick) 4.8  Cardiac Index (Fick) 1.91 PVR 4.8 WU      Objective:   Weight Range: 121 kg (266 lb 12.1 oz) Body mass index is 31.63 kg/m.   Vital Signs:                 Temp:  [97.7 F (36.5 C)-98.3 F (36.8 C)] 98 F (36.7 C) (05/11 1125) Pulse Rate:  [42-92] 81 (05/11 1125) Resp:  [18-25] 23 (05/11 1125) BP: (105-140)/(75-104) 105/86 (05/11 1125) SpO2:  [94 %-100 %] 95 % (05/11 1125) Weight:  [121 kg (266 lb 12.1 oz)] 121 kg (266 lb 12.1 oz) (05/11 0325) Last BM Date: 11/02/17  Weight change:      Filed Weights   11/01/17 0054 11/02/17 0304 11/03/17 0325  Weight: 123.2 kg (271 lb 9.7 oz) 122.7 kg (270 lb 8.1 oz) 121 kg (266 lb 12.1 oz)    Intake/Output:             Intake/Output Summary (Last 24 hours) at 11/03/2017 1316 Last data filed at 11/02/2017 2145    Gross per 24 hour  Intake 669.63 ml  Output 1500 ml  Net -830.37 ml                 Physical Exam    General:  Sitting in chair No resp difficulty HEENT: normal Neck: supple. JVP 10  Carotids  2+ bilat; no bruits. No lymphadenopathy or thryomegaly appreciated. Cor: PMI nondisplaced. Regular rate & rhythm. No rubs, gallops or murmurs. Lungs: clear Abdomen: soft, nontender, nondistended. No hepatosplenomegaly. No bruits or masses. Good bowel sounds. Extremities: no cyanosis, clubbing, rash, 2-3+ edema L>R with wound on LLE Neuro: alert & orientedx3, cranial nerves grossly intact. moves all 4 extremities w/o difficulty. Affect pleasant   Telemetry   NSR 80s, personally reviewed.   Labs    CBC RecentLabs(last2labs)      Recent Labs    11/02/17 0333 11/03/17 0316  WBC 7.4 8.2  HGB 17.7* 16.6  HCT 54.8* 51.6  MCV 100.7* 100.8*  PLT 218 202     Basic Metabolic Panel RecentLabs(last2labs)  Recent Labs    11/02/17 0333 11/03/17 0316  NA 138 140  K 3.9 4.0  CL 91* 94*  CO2 35* 33*  GLUCOSE 220* 202*  BUN 20 15  CREATININE 1.14 1.12  CALCIUM 8.6* 8.6*     Liver Function Tests RecentLabs(last2labs)      Recent Labs    11/01/17 0242 11/02/17 0333  AST 40 32  ALT 52 40  ALKPHOS 87 82  BILITOT 2.1* 2.2*  PROT 7.6 7.0  ALBUMIN 3.8 3.4*     RecentLabs(last2labs)  No results for input(s): LIPASE, AMYLASE in the last 72 hours.   Cardiac Enzymes RecentLabs(last2labs)       Recent Labs    10/31/17 1411 10/31/17 2014 11/01/17 0242  TROPONINI 0.58* 0.49* 0.50*      BNP: BNP (last 3 results) RecentLabs(withinlast365days)     Recent Labs    10/31/17 1038  BNP 671.4*      ProBNP (last 3 results) RecentLabs(withinlast365days)  No results for input(s): PROBNP in the last 8760 hours.     D-Dimer RecentLabs(last2labs)  No results for input(s): DDIMER in the last 72 hours.   Hemoglobin A1C RecentLabs(last2labs)  Recent Labs    10/31/17 1620  HGBA1C 10.4*     Fasting Lipid Panel RecentLabs(last2labs)     Recent Labs    11/03/17 0316  CHOL 122  HDL 35*  LDLCALC 69    TRIG 90  CHOLHDL 3.5     Thyroid Function Tests  RecentLabs(last2labs)  No results for input(s): TSH, T4TOTAL, T3FREE, THYROIDAB in the last 72 hours.  Invalid input(s): FREET3    Other results:   Imaging     No results found.   Medications:     Scheduled Medications:  .  allopurinol   100 mg  Oral  Daily   .  aspirin EC   81 mg  Oral  Daily   .  atorvastatin   40 mg  Oral  q1800   .  clopidogrel   75 mg  Oral  Q breakfast   .  digoxin   0.125 mg  Oral  Daily   .  DULoxetine   60 mg  Oral  Daily   .  enoxaparin (LOVENOX) injection   0.5 mg/kg  Subcutaneous  Q24H   .  eplerenone   25 mg  Oral  Daily   .  furosemide   80 mg  Intravenous  BID   .  insulin aspart   0-20 Units  Subcutaneous  TID WC   .  insulin aspart   0-5 Units  Subcutaneous  QHS   .  insulin glargine   10 Units  Subcutaneous  QHS   .  isosorbide-hydrALAZINE   1 tablet  Oral  TID   .  magnesium oxide   400 mg  Oral  Daily   .  mouth rinse   15 mL  Mouth Rinse  BID   .  pantoprazole   40 mg  Oral  Daily   .  pregabalin   225 mg  Oral  Daily   .  ramelteon   8 mg  Oral  QHS   .  sacubitril-valsartan   1 tablet  Oral  BID   .  sodium chloride flush   3 mL  Intravenous  Q12H   .  sodium chloride flush   3 mL  Intravenous  Q12H     Infusions:  .  sodium chloride        PRN Medications:  sodium chloride, acetaminophen **OR** acetaminophen, acetaminophen, ondansetron **OR** ondansetron (ZOFRAN) IV, polyethylene glycol, sodium chloride flush   Assessment/Plan   1. Acute on chronic systolic CHF: Primarily nonischemic cardiomyopathy. Echo in 2017 with EF 15-20%. Medtronic ICD. Echo this admission with EF 15-20%, severely dilated RV with severely decreased systolic function. LHC/RHC today showed ongoing volume overload with 80% RCA stenosis.  Cardiac index low at 1.91.  The degree of coronary disease does not  explain his cardiomyopathy.  He diuresed yesterday well with IV Lasix.  - Remains volume overloaded. Continue Lasix 80 mg IV bi - Continue Entresto 49/51 bid.  - Gynecomastia with spironolactone on eplerenone - Continue Bidil to 1 tab tid.  - Continue digoxin with low output. Hold on beta blocker for now.  - Eplerenone 25 daily (gynecomastia with spironolactone).  2. COPD: Uses oxygen at night at home. Never smoked, but apparently had significant occupational exposure. It appears that he won a lawsuit dealing with the occupational exposure-related COPD.  3. CAD: Cath in 5/17 with 60% RCA stenosis. Troponin 0.5 this admit without chest pain (just exertional dyspnea). Cath this admission with 80-90% proximal RCA stenosis with DES to RCA.  This did not cause his cardiomyopathy but is a large RCA. - ASA/Plavix and atorvastatin     Length of Stay: 3  Arvilla Meres, MD  11/03/2017, 1:16 PM  Advanced Heart Failure Team Pager [phone] (M-F; 7a - 4p)  Please contact [facility] for night-coverage after hours (4p -7a ) and weekends on [email or web address]

## 2017-11-04 NOTE — Progress Notes (Signed)
   Subjective: Patient was feeling better when seen this morning.  He tried to ambulate on room air and desaturated up to 69%, which improved quickly to 95% with 2 L.  Patient uses oxygen at home whenever he thinks he needs it and his criteria is when he started gasping for breath.  Had a discussion with him to use his oxygen all the time.  Patient had no new complaints today and wants to go home.  Objective:  Vital signs in last 24 hours: Vitals:   11/03/17 2339 11/04/17 0327 11/04/17 0609 11/04/17 0735  BP: 98/75 104/82  111/80  Pulse: 90 80  78  Resp: (!) 25 20  16   Temp: 98.4 F (36.9 C) 98.2 F (36.8 C)  97.9 F (36.6 C)  TempSrc: Oral Oral  Oral  SpO2: 94% 95%  100%  Weight:   268 lb 12.8 oz (121.9 kg)   Height:       General.  Well-developed, obese gentleman, sitting comfortably in chair, in no acute distress. Lungs.  Mildly decreased breath sounds with very few basal crackles. CV.  Regular rate and rhythm. Abdomen.  Soft, distended, bowel sounds positive. Extremities.  1+ pitting edema bilaterally with signs of venous stasis and small multiple ulcerations on left lower extremity.  Assessment/Plan:  #HTN,Acute on ChronicSystolic HF,NICMwithMedtronicICD, s/p R&LHC:  Improving, appears euvolemic today with stable weight.  It appears that the ins and outs are not correctly recorded over the last 24-hour.  Total 12 pound weight loss.  A small bump in his BUN and creatinine. -I think he is ready to switch to p.o. Lasix-we will appreciate heart failure team recommendations. -Continue digoxin 0.125 mg daily. -Continue home dose of Entresto. -Bidil dose was increased to 1 mg 3 times daily yesterday. -Continue eplerenone 25 mg daily. -He will continue aspirin and Plavix. -Beta-blocker will be added once euvolemic. -He will need 2 L of oxygen all the time. -He will benefit with cardiac rehab on discharge.  #COPD:Never smoker, COPD 2/2 occupational exposure (legally  compensated). -He was saturating well on 2 L. He has very low health literacy and insight of his medical problems.  We had a long discussion that he needs to wear his oxygen all the time and not to wait for gasping breath to put on his oxygen which he was doing before.  #Hematologic Ab/ns:HgB 18.3 on admission, most likely secondary to chronic hypoxia and hypercarbia. Hemoglobin improving, today it was 16.4. Jak 2 mutation results still pending.  DM: A1c 10.4, BG remained between 207-275 over the past 24-hour. -Increase Lantus to 20 units at bedtime.  His basal requirement is more based on his body weight. -Continue resistant sliding scale. -Continue home duloxetine and Lyrica.  OSA: Patient may have sleep apnea as he mentions failure to wear CPAP in the past -He will need a new sleep study as an outpatient.   Dispo: Anticipated discharge in approximately 0-1 day(s).   Arnetha Courser, MD 11/04/2017, 9:40 AM Pager: [phone]

## 2017-11-05 ENCOUNTER — Encounter (HOSPITAL_COMMUNITY): Payer: Self-pay

## 2017-11-05 DIAGNOSIS — I25758 Atherosclerosis of native coronary artery of transplanted heart with other forms of angina pectoris: Secondary | ICD-10-CM

## 2017-11-05 LAB — BASIC METABOLIC PANEL
ANION GAP: 11 (ref 5–15)
BUN: 20 mg/dL (ref 6–20)
CHLORIDE: 92 mmol/L — AB (ref 101–111)
CO2: 35 mmol/L — AB (ref 22–32)
Calcium: 9 mg/dL (ref 8.9–10.3)
Creatinine, Ser: 1.06 mg/dL (ref 0.61–1.24)
GFR calc non Af Amer: >60 mL/min (ref >60)
Glucose, Bld: 191 mg/dL — ABNORMAL HIGH (ref 65–99)
POTASSIUM: 4.1 mmol/L (ref 3.5–5.1)
Sodium: 138 mmol/L (ref 135–145)

## 2017-11-05 LAB — CBC
HCT: 50.4 % (ref 39.0–52.0)
Hemoglobin: 16.5 g/dL (ref 13.0–17.0)
MCH: 32.4 pg (ref 26.0–34.0)
MCHC: 32.7 g/dL (ref 30.0–36.0)
MCV: 98.8 fL (ref 78.0–100.0)
PLATELETS: 226 10*3/uL (ref 150–400)
RBC: 5.1 MIL/uL (ref 4.22–5.81)
RDW: 14.2 % (ref 11.5–15.5)
WBC: 5.6 10*3/uL (ref 4.0–10.5)

## 2017-11-05 LAB — GLUCOSE, CAPILLARY
GLUCOSE-CAPILLARY: 193 mg/dL — AB (ref 65–99)
GLUCOSE-CAPILLARY: 260 mg/dL — AB (ref 65–99)
GLUCOSE-CAPILLARY: 175 mg/dL — AB (ref 65–99)
GLUCOSE-CAPILLARY: 247 mg/dL — AB (ref 65–99)

## 2017-11-05 LAB — MRSA PCR SCREENING: MRSA BY PCR: NEGATIVE

## 2017-11-05 MED ORDER — DIPHENHYDRAMINE HCL 25 MG PO CAPS
25.0000 mg | ORAL_CAPSULE | Freq: Once | ORAL | Status: AC
  Administered 2017-11-05: 25 mg via ORAL
  Filled 2017-11-05: qty 1

## 2017-11-05 MED ORDER — TORSEMIDE 20 MG PO TABS
40.0000 mg | ORAL_TABLET | Freq: Every day | ORAL | Status: DC
  Administered 2017-11-05 – 2017-11-06 (×2): 40 mg via ORAL
  Filled 2017-11-05 (×2): qty 2

## 2017-11-05 NOTE — Progress Notes (Signed)
Patient ID: Richard [name], male   DOB: December 16, 1960, 57 y.o.   MRN: [id number]     Advanced Heart Failure Rounding Note  PCP-Cardiologist: No primary care provider on file.   Subjective:    Patient diuresed well yesterday, weight down 2 lbs.  He feels like his breathing is back to normal.   No orthopnea/PND.    LHC/RHC (5/10):  Coronary Findings  Diagnostic  Dominance: Right  Left Main  No significant disease.  Left Anterior Descending  80% distal LAD stenosis.  Ramus Intermedius  Moderate ramus with luminal irregularities.  Left Circumflex  Luminal irregularities.  Right Coronary Artery  Focal 80% stenosis proximal RCA.  Intervention   No interventions have been documented.  Right Heart   Right Heart Pressures RHC Procedural Findings: Hemodynamics (mmHg)  RA 13 RV 67/18 PA 69/31, mean 47 PCWP mean 24 LV 128/33 AO 117/85  Oxygen saturations: PA 66% AO 98%  Cardiac Output (Fick) 4.8  Cardiac Index (Fick) 1.91 PVR 4.8 WU      Objective:   Weight Range: 266 lb 15.6 oz (121.1 kg) Body mass index is 31.66 kg/m.   Vital Signs:   Temp:  [97.5 F (36.4 C)-98.2 F (36.8 C)] 98.1 F (36.7 C) (05/13 0700) Pulse Rate:  [72-86] 78 (05/13 0333) Resp:  [16-33] 33 (05/13 0700) BP: (92-110)/(69-85) 110/85 (05/13 0700) SpO2:  [95 %-100 %] 100 % (05/13 0700) Weight:  [266 lb 15.6 oz (121.1 kg)] 266 lb 15.6 oz (121.1 kg) (05/13 0333) Last BM Date: 11/04/17  Weight change: Filed Weights   11/03/17 0325 11/04/17 0609 11/05/17 0333  Weight: 266 lb 12.1 oz (121 kg) 268 lb 12.8 oz (121.9 kg) 266 lb 15.6 oz (121.1 kg)    Intake/Output:   Intake/Output Summary (Last 24 hours) at 11/05/2017 0835 Last data filed at 11/05/2017 0400 Gross per 24 hour  Intake 1200 ml  Output 2400 ml  Net -1200 ml      Physical Exam    General: NAD Neck: Thick, JVP difficult but does not appear significantly elevated, no thyromegaly or thyroid nodule.  Lungs: Clear to auscultation  bilaterally with normal respiratory effort. CV: Nondisplaced PMI.  Heart regular S1/S2, no S3/S4, no murmur.  1+ edema to knees bilaterally.   Abdomen: Soft, nontender, no hepatosplenomegaly, no distention.  Skin: Intact without lesions or rashes.  Neurologic: Alert and oriented x 3.  Psych: Normal affect. Extremities: No clubbing or cyanosis.  HEENT: Normal.    Telemetry   NSR 80s, personally reviewed.   Labs    CBC Recent Labs    11/04/17 0324 11/05/17 0233  WBC 6.4 5.6  HGB 16.4 16.5  HCT 50.6 50.4  MCV 100.4* 98.8  PLT 214 226   Basic Metabolic Panel Recent Labs    69/62/95 0324 11/05/17 0233  NA 136 138  K 3.5 4.1  CL 91* 92*  CO2 35* 35*  GLUCOSE 326* 191*  BUN 22* 20  CREATININE 1.15 1.06  CALCIUM 8.6* 9.0   Liver Function Tests No results for input(s): AST, ALT, ALKPHOS, BILITOT, PROT, ALBUMIN in the last 72 hours. No results for input(s): LIPASE, AMYLASE in the last 72 hours. Cardiac Enzymes No results for input(s): CKTOTAL, CKMB, CKMBINDEX, TROPONINI in the last 72 hours.  BNP: BNP (last 3 results) Recent Labs    10/31/17 1038  BNP 671.4*    ProBNP (last 3 results) No results for input(s): PROBNP in the last 8760 hours.   D-Dimer No results for input(s): DDIMER  in the last 72 hours. Hemoglobin A1C No results for input(s): HGBA1C in the last 72 hours. Fasting Lipid Panel Recent Labs    11/03/17 0316  CHOL 122  HDL 35*  LDLCALC 69  TRIG 90  CHOLHDL 3.5   Thyroid Function Tests No results for input(s): TSH, T4TOTAL, T3FREE, THYROIDAB in the last 72 hours.  Invalid input(s): FREET3  Other results:   Imaging    No results found.   Medications:     Scheduled Medications: . allopurinol  100 mg Oral Daily  . aspirin EC  81 mg Oral Daily  . atorvastatin  40 mg Oral q1800  . clopidogrel  75 mg Oral Q breakfast  . digoxin  0.125 mg Oral Daily  . DULoxetine  60 mg Oral Daily  . enoxaparin (LOVENOX) injection  0.5 mg/kg  Subcutaneous Q24H  . eplerenone  25 mg Oral Daily  . insulin aspart  0-20 Units Subcutaneous TID WC  . insulin aspart  0-5 Units Subcutaneous QHS  . insulin glargine  20 Units Subcutaneous QHS  . isosorbide-hydrALAZINE  1 tablet Oral TID  . magnesium oxide  400 mg Oral Daily  . mouth rinse  15 mL Mouth Rinse BID  . pantoprazole  40 mg Oral Daily  . potassium chloride  40 mEq Oral BID  . pregabalin  225 mg Oral Daily  . ramelteon  8 mg Oral QHS  . sacubitril-valsartan  1 tablet Oral BID  . sodium chloride flush  3 mL Intravenous Q12H  . sodium chloride flush  3 mL Intravenous Q12H  . torsemide  40 mg Oral Daily    Infusions: . sodium chloride      PRN Medications: sodium chloride, acetaminophen **OR** acetaminophen, ondansetron **OR** ondansetron (ZOFRAN) IV, polyethylene glycol, sodium chloride flush   Assessment/Plan   1. Acute on chronic systolic CHF: Primarily nonischemic cardiomyopathy.  Echo in 2017 with EF 15-20%.  Medtronic ICD. Echo this admission with EF 15-20%, severely dilated RV with severely decreased systolic function.  LHC/RHC today showed ongoing volume overload with 80% RCA stenosis.  Cardiac index low at 1.91. The degree of coronary disease does not explain his cardiomyopathy.  He diuresed yesterday well with IV Lasix + metolazone.  On exam, he still has peripheral edema but JVP is down.  He feels considerably better.  - Will give 1 more dose of IV Lasix today then transition to torsemide 40 mg po daily (was on 20 mg daily at home).  - Continue Entresto 49/51 bid.  - Gynecomastia with spironolactone, now on eplerenone 25 daily.  - Continue Bidil to 1 tab tid.  - Continue digoxin with low output. Hold on beta blocker for now.  - BP now softer, will not titrate meds today.   2. COPD: Uses oxygen at night at home.  Never smoked, but apparently had significant occupational exposure.  It appears that he won a lawsuit dealing with the occupational exposure-related COPD.   3. CAD: Cath in 5/17 with 60% RCA stenosis.  Troponin 0.5 this admit without chest pain (just exertional dyspnea). Cath this admission with 80-90% proximal RCA stenosis with DES to RCA.  This did not cause his cardiomyopathy but is a large RCA. - ASA/Plavix and atorvastatin  4. Polycythemia: Likely related to chronic hypoxia. Will need sleep study as outpatient 5. Hypokalemia: Resolved.   Transition to po meds today.  Probably home tomorrow.  Will have him followup in CHF clinic.   Length of Stay: 5  Marca Ancona, MD  11/05/2017, 8:35 AM  Advanced Heart Failure Team Pager [phone] (M-F; 7a - 4p)  Please contact [facility] for night-coverage after hours (4p -7a ) and weekends on [email or web address]

## 2017-11-05 NOTE — Progress Notes (Signed)
Subjective: [name] appears clinically much better. He states that he is able to lay flat without SOB though he has been sleeping in the chair mostly because he is used to it. He was encouraged that elevating his feet may assist in alleviation of his peripheral edema. He continues to wear compression socks over BLE.  He has continued to diurese well with IV Lasix, ready to go home.  Objective:  Vital signs in last 24 hours: Vitals:   11/05/17 0002 11/05/17 0333 11/05/17 0700 11/05/17 0830  BP: 106/84 93/70 110/85   Pulse: 78 78    Resp: (!) 22 17 (!) 33 (!) 21  Temp: 98.1 F (36.7 C) (!) 97.5 F (36.4 C) 98.1 F (36.7 C)   TempSrc: Oral Oral Oral   SpO2: 96% 95% 100%   Weight:  266 lb 15.6 oz (121.1 kg)    Height:       Physical Exam  Constitutional: He appears well-developed.  HENT:  Head: Normocephalic and atraumatic.  Eyes: EOM are normal.  Neck: Normal range of motion. JVD: not visible.  Cardiovascular: Normal rate, normal heart sounds and intact distal pulses.  Pulmonary/Chest: Effort normal and breath sounds normal.  Abdominal: Soft. Bowel sounds are normal. Distention: decreased.  Musculoskeletal:       Right lower leg: Right lower leg edema: 1+ pitting.       Left lower leg: Left lower leg edema: 1+ pitting.  Vitals reviewed.    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 Total 12 pound weight loss. Cr lowest this admission at 1.06. -Heart failure team discontinued IV Lasix today and started him on torsemide 40 mg daily. -Continue digoxin 0.125 mg daily. -Continue home dose of Entresto. -Bidildose 1mg  TID -Continueeplerenone 25 mg daily. -He will continue aspirin and Plavix. -He will need 2 L of oxygen all the time. - Consider beta-blocker, now euvolemic; BP still softer -He will benefit with cardiac rehab on discharge. - Digoxin level ordered  #COPD:Never smoker, COPD 2/2 occupational exposure (legally compensated). He has very low health literacy and insight of his medical problems.  We had a long discussion that he needs to wear his oxygen all the time, not only when gasping for breath. - Continue with home O2 requirement of 2L  #Hematologic Ab/ns:HgB 18.3 on admission,most likely secondary to chronic hypoxia and hypercarbia. Hemoglobin improving, today it was 16.4. - LDH negative - JAK2 pending  DM:A1c 10.4,BG 190s this AM. -Increase Lantus to 20 units at bedtime.  His basal requirement is more based on his body weight. -Continue resistant sliding scale. -Continue home duloxetine and Lyrica.  OSA: Patient may have sleep apnea as he mentions failure to wear CPAP in the past -He will need a new sleep study as an outpatient.  Dispo: Anticipated discharge in approximately 1 day(s).   Richard [name], Medical Student 11/05/2017, 10:37 AM Pager: [phone]  Attestation for  Student Documentation:  I personally was present and performed or re-performed the history, physical exam and medical decision-making activities of this service and have verified that the service and findings are accurately documented in the student's note.  Arnetha Courser, MD 11/05/2017, 11:35 AM

## 2017-11-05 NOTE — Progress Notes (Signed)
Inpatient Diabetes Program Recommendations  AACE/ADA: New Consensus Statement on Inpatient Glycemic Control (2015)  Target Ranges:  Prepandial:   less than 140 mg/dL      Peak postprandial:   less than 180 mg/dL (1-2 hours)      Critically ill patients:  140 - 180 mg/dL   Lab Results  Component Value Date   GLUCAP 193 (H) 11/05/2017   HGBA1C 10.4 (H) 10/31/2017    Review of Glycemic Control  Inpatient Diabetes Program Recommendations:   Called Dr. Shiela Mayer office and requested clinical staff to call with patient's home insulin prescribed regimen. -Novolog 5 units tid meal coverage if eats 50%  Thank you, Richard [name]. Mamta Rimmer, RN, MSN, CDE  Diabetes Coordinator Inpatient Glycemic Control Team Team Pager [phone] (8am-5pm) 11/05/2017 9:59 AM

## 2017-11-05 NOTE — Care Management Important Message (Signed)
Important Message  Patient Details  Name: Richard [name] MRN: [id number] Date of Birth: Jul 10, 1960   Medicare Important Message Given:  Yes    Tiari Andringa P Djon Tith 11/05/2017, 2:42 PM

## 2017-11-05 NOTE — Progress Notes (Signed)
  Date: 11/05/2017  Patient name: Richard [name]  Medical record number: [id number]  Date of birth: [birth year]   I have seen and evaluated this patient and I have discussed the plan of care with the house staff. Please see their note for complete details. I concur with their findings with the following additions/corrections: [name] was seen on AM rounds with the team. He is sitting in a recliner but now able to lay flat without orthopnea although he prefers to sleep sitting in chair. Net -6 L, net -1.2 L yesterday. Weight down 5 lbs since admission. Cr stable. Now on PO diuretic regimen.   Bache Spain, MD 11/05/2017, 1:34 PM

## 2017-11-05 NOTE — Discharge Summary (Signed)
Name: Richard [name] MRN: [id number] DOB: [birth year] 57 y.o. PCP: Wallene Dales, MD  Date of Admission: 10/31/2017  9:31 AM Date of Discharge: 11/06/17 Attending Physician: Bartholomew Crews, MD  Discharge Diagnosis: CHF Exacerbation Hypertensive emergency  Principal Problem:   S/P right and left heart catheterization Active Problems:   COPD (chronic obstructive pulmonary disease) ([place])   Hypertension   CKD (chronic kidney disease)   Diabetes mellitus with diabetic neuropathy, with long-term current use of insulin (H[facility])   Acute on chronic combined systolic and diastolic CHF (congestive heart failure) ([place])   Non-STEMI (non-ST elevated myocardial infarction) ([place])   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 ([place])   Panic attacks   Discharge Medications: Allergies as of 11/06/2017      Reactions   Codeine       Medication List    STOP taking these medications   gemfibrozil 600 MG tablet Commonly known as:  LOPID   glipiZIDE 10 MG 24 hr tablet Commonly known as:  GLUCOTROL XL   HUMULIN R U-500 KWIKPEN 500 UNIT/ML kwikpen Generic drug:  insulin regular human CONCENTRATED   metoprolol succinate 25 MG 24 hr tablet Commonly known as:  TOPROL-XL   pravastatin 20 MG tablet Commonly known as:  PRAVACHOL     TAKE these medications   allopurinol 100 MG tablet Commonly known as:  ZYLOPRIM Take 100 mg by mouth daily.   aspirin 81 MG EC tablet Take 1 tablet (81 mg total) by mouth daily. Start taking on:  11/07/2017   atorvastatin 40 MG tablet Commonly known as:  LIPITOR Take 1 tablet (40 mg total) by mouth daily at 6 PM.   BASAGLAR KWIKPEN 100 UNIT/ML Sopn Inject 0.2 mLs (20 Units total) into the skin 2 (two) times daily.   BREO ELLIPTA 100-25 MCG/INH Aepb Generic drug:  fluticasone furoate-vilanterol Inhale 1 puff into the lungs daily.   clopidogrel 75 MG tablet Commonly known as:  PLAVIX Take 1 tablet (75 mg  total) by mouth daily with breakfast. Start taking on:  11/07/2017   digoxin 0.125 MG tablet Commonly known as:  LANOXIN Take 1 tablet (0.125 mg total) by mouth daily. Start taking on:  11/07/2017   DULoxetine 60 MG capsule Commonly known as:  CYMBALTA Take 60 mg by mouth daily.   empagliflozin 10 MG Tabs tablet Commonly known as:  JARDIANCE Take 10 mg by mouth daily.   eplerenone 25 MG tablet Commonly known as:  INSPRA Take 1 tablet (25 mg total) by mouth daily. Start taking on:  11/07/2017   hydrALAZINE 10 MG tablet Commonly known as:  APRESOLINE Take 1 tablet (10 mg total) by mouth 3 (three) times daily.   Insulin Pen Needle 30G X 8 MM Misc Commonly known as:  NOVOFINE Inject 10 each into the skin as needed.   isosorbide dinitrate 10 MG tablet Commonly known as:  ISORDIL Take 1 tablet (10 mg total) by mouth 3 (three) times daily.   LYRICA 225 MG capsule Generic drug:  pregabalin Take 225 mg by mouth daily.   MAGNESIUM-OXIDE 400 (241.3 Mg) MG tablet Generic drug:  magnesium oxide Take 400 mg by mouth daily.   omeprazole 20 MG capsule Commonly known as:  PRILOSEC Take 20 mg by mouth daily.   sacubitril-valsartan 49-51 MG Commonly known as:  ENTRESTO Take 1 tablet by mouth 2 (two) times daily.   torsemide 20 MG tablet Commonly known as:  DEMADEX Take 2 tablets (40 mg  total) by mouth daily. Start taking on:  11/07/2017 What changed:  how much to take   Vitamin D (Cholecalciferol) 1000 units Caps Take 1,000 Units by mouth daily.       Disposition and follow-up:   RichardBruin [name] was discharged from [facility] in Stable condition.  At the hospital follow up visit please address:  1a. HF Regimen: (listed above), add beta blocker if BP will tolerate and patient is euvolemic 1b. T2DM: SGLT2 added, avoiding hypoglycemia risk with SUR 1c. Wound Care: Continue outpatient care of BLE 1d. Oxygen requirement: d/c with portable O2 concentrator,  counseling to wear when upright . 1e. PT/OT Needs  2.  Labs / imaging needed at time of follow-up: none  3.  Pending labs/ test needing follow-up: Digoxin Level, JAK2 Pending  Follow-up Appointments: Follow-up Information    [facility]Home Follow up.   Specialty:  Home Health Services Why:  [place] information: [place] [address] [phone]        Richard Clayton, NP Follow up on 11/12/2017.   Specialty:  Cardiology Why:  Heart Failure Follow up-3pm-Parking at [facility] ER lot (enter under blue "Specialty Clinics" awning) or under [place] on [facility] [facility] [address], materials entrance, garage code: 1200, elevator to 1st floor). Take meds as scheduled, bring bottles. Contact information: [address] [place] [address] [phone]        Wallene Dales, MD. Go on 11/15/2017.   Specialty:  Family Medicine Why:  You will be seen in Dr. Joetta Manners' clinic by one of his physician assistants on Thursday, May 23. Your appointment is at 9 AM, please arrive 15 minutes prior with insurance card and identification. Contact information: [address] [place] [address] [place] by problem list:  #Acute on chronic systolic HF, s/p R/L heart catheterization: [name] presented to the ED in acute respiratory distress consistent with flash pulmonary edema brought on by hypertensive emergency in the setting of prolonged orthopnea and LE edema.His arterial blood gas showed mild acidosis (pH 7.32),parameters consistent with chronic respiratory acidosis secondary to COPD with metabolic compensation with superimposed acute hypercapnia(pCO2 70, pO2 90). Troponins were elevated to 0.58 secondary to demand ischemia. Cardiology was consulted with concern for low output heart failure. Echo revealed diffuse hypokinesis with an EF of 15-20%. Cardiac catheterization revealed 80-90% stenosis of  RCA which was reopened with a drug eluting stent (see below). He was placed and ultimately discharged on a regimen of isosorbide-hydralazine, eplerenone (failed spironolactone secondary to gynecomastia), digoxin, torsemide, and dual antiplatelet therapy with aspirin and clopidogrel. Beta blocker initiation was deferred at this time due to soft blood pressures. At time of discharge, he experienced shortness of breath only with exertion in the absence of home oxygen and no orthopnea. He will receive cardiac rehabilitation and portable O2 at discharge.   DM: Mr. Strebeck' was maintained on sliding scale insulin and lantus during his hospital stay. His diabetes was uncontrolled with an HbA1c of 10.4. Due to additional benefits of SGLT2 inhibitor, he was discharged on empagliflozin and insuling glargine 20u BID. Continue home duloxetine and pregabalin, as well as outpatient wound care for bilateral lower extremities.  OSA: Patient may have sleep apnea as he mentions failure to wear CPAP in the past. This is not documented in his HF clinic notes. Patient would likely benefit from sleep study and CPAP rx for long  term HTN prevention.  Discharge Vitals:   BP 93/71 (BP Location: Right Arm)   Pulse 76   Temp 98 F (36.7 C) (Oral)   Resp (!) 26   Ht '6\' 5"'  (1.956 m)   Wt 260 lb 6.4 oz (118.1 kg)   SpO2 91%   BMI 30.88 kg/m   Pertinent Labs, Studies, and Procedures:  1. Admission BNP: 671.4 10/31/17 2. TTE & Cath/PCI Findings Below  Transthoracic Echocardiography 11/01/2017 Study Conclusions  - Left ventricle: The cavity size was severely dilated. There was   mild concentric hypertrophy. Systolic function was severely   reduced. The estimated ejection fraction was in the range of 15%   to 20%. Diffuse hypokinesis. Features are consistent with a   pseudonormal left ventricular filling pattern, with concomitant   abnormal relaxation and increased filling pressure (grade 2   diastolic dysfunction).  Doppler parameters are consistent with elevated ventricular end-diastolic filling pressure. - Mitral valve: There was mild regurgitation. - Left atrium: The atrium was moderately dilated. - Right ventricle: The cavity size was severely dilated. Wall  thickness was normal. Systolic function was severely reduced. - Right atrium: The atrium was mildly dilated. - Tricuspid valve: There was mild regurgitation. - Pulmonary arteries: Systolic pressure was within the normal range. - Inferior vena cava: The vessel was normal in size.  Impressions:  - Severe left and right ventricular dilatation. Severe bi-ventricular systolic dysfunction. Grade 2 diastolic dysfunction with elevated filloing pressures. No significant valvular abnormalities.  Coronary Stent Intervention & R/L Heart Catheterization and Coronary Angiography 11/02/17 CATH REPORT:  Non-stenotic Ost RCA to Prox RCA lesion.  Post intervention, there is a 0% residual stenosis.  A stent was successfully placed.   1. Elevated right and left heart filling pressures.  2. Low cardiac index at 1.9.   3. 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  4. 80% proximal RCA stenosis.   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  However, the RCA is a large vessel and should be fixed.  We will plan for PCI to RCA today.    He will need ongoing diuresis.   STENT REPORT:  A stent was successfully placed.    Successful direct stent PCI on the proximal RCA with 85% stenosis and TIMI grade III flow reduced to 0% with TIMI Grade 3 Flow.  An 18 x 4.0 Onyx was deployed at 16 atm x 2 inflations.  RECOMMENDATIONS:   Plavix 600 mg loading and then 75 mg daily along with 81 mg aspirin.  Dual antiplatelet therapy is necessary for only 6 months given non-ACS presentation.  Aggrastat bolus given to cover for possible stent thrombosis over the next 2 hours given no  previous loading with dual antiplatelet therapy.  Further management per Dr. Aundra Dubin.  CORONARY STENT INTERVENTION  Conclusion     A stent was successfully placed.    Successful direct stent PCI on the proximal RCA with 85% stenosis and TIMI grade III flow reduced to 0% with TIMI Grade 3 Flow.  An 18 x 4.0 Onyx was deployed at 16 atm x 2 inflations.  RECOMMENDATIONS:   Plavix 600 mg loading and then 75 mg daily along with 81 mg aspirin.  Dual antiplatelet therapy is necessary for only 6 months given non-ACS presentation.  Aggrastat bolus given to cover for possible stent thrombosis over the next 2 hours given no previous loading with dual antiplatelet therapy.  Further management per  Dr. Aundra Dubin.    Coronary Findings   Diagnostic  Dominance: Right  Right Coronary Artery  Ost RCA to Prox RCA lesion 85% stenosed  Ost RCA to Prox RCA lesion is 85% stenosed.  Dist RCA lesion 40% stenosed  Dist RCA lesion is 40% stenosed.  Intervention   Ost RCA to Prox RCA lesion  Stent  A stent was successfully placed.  Post-Intervention Lesion Assessment  The intervention was successful.  There is no residual stenosis post intervention.  Coronary Diagrams   Diagnostic Diagram       Post-Intervention Diagram           Discharge Instructions: Discharge Instructions    (HEART FAILURE PATIENTS) Call MD:  Anytime you have any of the following symptoms: 1) 3 pound weight gain in 24 hours or 5 pounds in 1 week 2) shortness of breath, with or without a dry hacking cough 3) swelling in the hands, feet or stomach 4) if you have to sleep on extra pillows at night in order to breathe.   Complete by:  As directed    Amb Referral to Cardiac Rehabilitation   Complete by:  As directed    Diagnosis:   Heart Failure (see criteria below if ordering Phase II) Coronary Stents     Heart Failure Type:  Chronic Systolic   Avoid straining   Complete by:  As directed    Diet - low sodium  heart healthy   Complete by:  As directed    Diet - low sodium heart healthy   Complete by:  As directed    Diet Carb Modified   Complete by:  As directed    Discharge instructions   Complete by:  As directed    It was pleasure taking care of you. As we discussed that we have added multiple new medications.  Please take them as directed. For your diabetes stop taking your existing NovoLog and glipizide.  Not you will take Lantus 20 units twice a day, in the morning and at bedtime.  I am also adding a new medicine called Jardiance which can give you some benefit for your heart condition.  I am starting you at a lower dose 10 mg daily your primary care physician should be able to titrate if needed.  Please keep checking your blood glucose 3-4 times a day and bring your log with you to your appointment with your primary care doctor. Please follow-up with your heart failure doctors as directed. We are also ordering home health so they can help manage your medications and do some rehab for you. Please check your weight daily at the same time with same amount of clothes, if you see a weight gain of 3 pound in 1 day or 5 pound in a week please call your physician at heart failure clinic.   Heart Failure patients record your daily weight using the same scale at the same time of day   Complete by:  As directed    Increase activity slowly   Complete by:  As directed    Increase activity slowly   Complete by:  As directed    STOP any activity that causes chest pain, shortness of breath, dizziness, sweating, or exessive weakness   Complete by:  As directed       Signed: Laureen Ochs, Medical Student 11/06/2017, 2:09 PM   Pager: [phone]  Attestation for Student Documentation:  I personally was present and performed or re-performed the history, physical exam and  medical decision-making activities of this service and have verified that the service and findings are accurately documented in the  student's note.  Lorella Nimrod, MD 11/06/2017, 2:13 PM

## 2017-11-05 NOTE — Progress Notes (Signed)
CARDIAC REHAB PHASE I   PRE:  Rate/Rhythm: 82 SR    BP: sitting 103/73    SaO2: 97 2L  MODE:  Ambulation: 340 ft   POST:  Rate/Rhythm: 103 ST    BP: sitting 122/89     SaO2: 81 RA after walk, up to 90 RA with rest and PLB  Pt fairly steady but impulsive at times. Wanted to walk without O2. After 150 ft SaO2 91-92 RA however after 340 ft and sitting, SaO2 low at 81 RA. Up with pursed lip breathing.  Pt initially wanted to leave O2 off then asked for it again when I was leaving. Reiterated 2000 mg sodium diet, daily wts, signs of fluid, Plavix/ASA. He will continue to need reiteration.  [id number]  Harriet Masson CES, ACSM 11/05/2017 11:19 AM

## 2017-11-06 ENCOUNTER — Other Ambulatory Visit: Payer: Self-pay | Admitting: Internal Medicine

## 2017-11-06 DIAGNOSIS — S81802A Unspecified open wound, left lower leg, initial encounter: Secondary | ICD-10-CM

## 2017-11-06 DIAGNOSIS — S81801A Unspecified open wound, right lower leg, initial encounter: Secondary | ICD-10-CM

## 2017-11-06 DIAGNOSIS — X58XXXA Exposure to other specified factors, initial encounter: Secondary | ICD-10-CM

## 2017-11-06 LAB — CBC WITH DIFFERENTIAL/PLATELET
BASOS ABS: 0 10*3/uL (ref 0.0–0.1)
Basophils Relative: 0 %
EOS PCT: 2 %
Eosinophils Absolute: 0.1 10*3/uL (ref 0.0–0.7)
HCT: 51.6 % (ref 39.0–52.0)
Hemoglobin: 16.7 g/dL (ref 13.0–17.0)
LYMPHS PCT: 32 %
Lymphs Abs: 1.6 10*3/uL (ref 0.7–4.0)
MCH: 32.4 pg (ref 26.0–34.0)
MCHC: 32.4 g/dL (ref 30.0–36.0)
MCV: 100 fL (ref 78.0–100.0)
MONO ABS: 0.5 10*3/uL (ref 0.1–1.0)
Monocytes Relative: 10 %
Neutro Abs: 2.8 10*3/uL (ref 1.7–7.7)
Neutrophils Relative %: 56 %
PLATELETS: 253 10*3/uL (ref 150–400)
RBC: 5.16 MIL/uL (ref 4.22–5.81)
RDW: 14.7 % (ref 11.5–15.5)
WBC: 5 10*3/uL (ref 4.0–10.5)

## 2017-11-06 LAB — BASIC METABOLIC PANEL
ANION GAP: 10 (ref 5–15)
BUN: 29 mg/dL — ABNORMAL HIGH (ref 6–20)
CHLORIDE: 90 mmol/L — AB (ref 101–111)
CO2: 37 mmol/L — ABNORMAL HIGH (ref 22–32)
Calcium: 9.2 mg/dL (ref 8.9–10.3)
Creatinine, Ser: 1.35 mg/dL — ABNORMAL HIGH (ref 0.61–1.24)
GFR calc non Af Amer: 58 mL/min — ABNORMAL LOW (ref >60)
Glucose, Bld: 262 mg/dL — ABNORMAL HIGH (ref 65–99)
Potassium: 4.7 mmol/L (ref 3.5–5.1)
Sodium: 137 mmol/L (ref 135–145)

## 2017-11-06 LAB — GLUCOSE, CAPILLARY
GLUCOSE-CAPILLARY: 202 mg/dL — AB (ref 65–99)
Glucose-Capillary: 216 mg/dL — ABNORMAL HIGH (ref 65–99)

## 2017-11-06 MED ORDER — SACUBITRIL-VALSARTAN 49-51 MG PO TABS: 1.0000 | ORAL_TABLET | Freq: Two times a day (BID) | ORAL | 0 refills | Status: DC

## 2017-11-06 MED ORDER — ASPIRIN 81 MG PO TBEC: 81.0000 mg | DELAYED_RELEASE_TABLET | Freq: Every day | ORAL | 0 refills | Status: DC

## 2017-11-06 MED ORDER — CLOPIDOGREL BISULFATE 75 MG PO TABS: 75.0000 mg | ORAL_TABLET | Freq: Every day | ORAL | 0 refills | Status: DC

## 2017-11-06 MED ORDER — HYDRALAZINE HCL 10 MG PO TABS: 10.0000 mg | ORAL_TABLET | Freq: Three times a day (TID) | ORAL | 0 refills | Status: DC

## 2017-11-06 MED ORDER — DIGOXIN 125 MCG PO TABS: 0.1250 mg | ORAL_TABLET | Freq: Every day | ORAL | 0 refills | Status: DC

## 2017-11-06 MED ORDER — INSULIN PEN NEEDLE 30G X 8 MM MISC: 1.0000 | 0 refills | Status: DC | PRN

## 2017-11-06 MED ORDER — ISOSORBIDE DINITRATE 10 MG PO TABS: 10.0000 mg | ORAL_TABLET | Freq: Three times a day (TID) | ORAL | 0 refills | Status: DC

## 2017-11-06 MED ORDER — ATORVASTATIN CALCIUM 40 MG PO TABS: 40.0000 mg | ORAL_TABLET | Freq: Every day | ORAL | 0 refills | Status: DC

## 2017-11-06 MED ORDER — BASAGLAR KWIKPEN 100 UNIT/ML ~~LOC~~ SOPN: 20.0000 [IU] | PEN_INJECTOR | Freq: Two times a day (BID) | SUBCUTANEOUS | 0 refills | Status: DC

## 2017-11-06 MED ORDER — ISOSORB DINITRATE-HYDRALAZINE 20-37.5 MG PO TABS
0.5000 | ORAL_TABLET | Freq: Three times a day (TID) | ORAL | 0 refills | Status: DC
Start: 2017-11-06 — End: 2017-11-06

## 2017-11-06 MED ORDER — INSULIN GLARGINE 100 UNIT/ML ~~LOC~~ SOLN: 20.0000 [IU] | Freq: Two times a day (BID) | SUBCUTANEOUS | 11 refills | Status: DC

## 2017-11-06 MED ORDER — EMPAGLIFLOZIN 10 MG PO TABS: 10.0000 mg | ORAL_TABLET | Freq: Every day | ORAL | 0 refills | Status: DC

## 2017-11-06 MED ORDER — TORSEMIDE 20 MG PO TABS: 40.0000 mg | ORAL_TABLET | Freq: Every day | ORAL | 0 refills | Status: DC

## 2017-11-06 MED ORDER — ISOSORB DINITRATE-HYDRALAZINE 20-37.5 MG PO TABS
0.5000 | ORAL_TABLET | Freq: Three times a day (TID) | ORAL | Status: DC
  Administered 2017-11-06: 0.5 via ORAL
  Filled 2017-11-06: qty 1

## 2017-11-06 MED ORDER — EPLERENONE 25 MG PO TABS: 25.0000 mg | ORAL_TABLET | Freq: Every day | ORAL | 0 refills | Status: DC

## 2017-11-06 MED FILL — Heparin Sod (Porcine)-NaCl IV Soln 1000 Unit/500ML-0.9%: INTRAVENOUS | Qty: 1000 | Status: AC

## 2017-11-06 MED FILL — Tirofiban HCl in NaCl 0.9% IV Soln 5 MG/100ML (Base Equiv): INTRAVENOUS | Qty: 100 | Status: AC

## 2017-11-06 NOTE — Progress Notes (Cosign Needed Addendum)
Subjective: Richard [name] was resting comfortably in his chair this morning, preparing for discharge. He denies CP, SOB, orthopnea. He endorses some dyspnea on exertion, which he states is his baseline.  He has complained about malodorous wounds over his shin bilaterally. He will plan to resume OP wound care at discharge.  Objective:  Vital signs in last 24 hours: Vitals:   11/05/17 1952 11/06/17 0650 11/06/17 0730 11/06/17 0809  BP: 100/82 96/70  106/81  Pulse: 75  77 79  Resp: 20  20 (!) 25  Temp: (!) 97.3 F (36.3 C) 98.2 F (36.8 C)  (!) 97.5 F (36.4 C)  TempSrc: Oral Oral  Oral  SpO2: 96% 96% 98% 96%  Weight:    260 lb 6.4 oz (118.1 kg)  Height:       Physical Exam  Constitutional: He is oriented to person, place, and time. He appears well-developed and well-nourished.  HENT:  Head: Normocephalic and atraumatic.  Eyes: EOM are normal.  Neck: Normal range of motion. JVD: not observed.  Cardiovascular: Normal rate and regular rhythm. Gallop: no S3.  Pulmonary/Chest: Effort normal and breath sounds normal. No accessory muscle usage.  No increased WOB  Abdominal: Soft. Bowel sounds are normal. Distention: decreased.  Neurological: He is alert and oriented to person, place, and time.  Skin: Skin is warm and dry.  Scattered 1-1.5 cm wounds on shins b/l  Psychiatric: He has a normal mood and affect. His behavior is normal.     Assessment/Plan:   #HTN,Acute on ChronicSystolic HF,NICMwithMedtronicICD, s/p R&LHC:Improving, appears euvolemic today with stable weight.Total 12 pound weight loss.Cr lowest this admission at 1.06 yesterday, increased to 1.35, indicating adequate diuresis. - Torsemide 40 mg po qd, Benadryl prn for itchiness - Continue digoxin 0.125 mg daily. - Continue home dose of Entresto. - Bidildose 1mg  TID - Continueeplerenone 25 mg daily - Continue DAPT with aspirin and Plavix. - Home requirement fo 2L O2 - Now euvolemic, but with softer BP;  add LD BB as outpatient - Will attend cardiac rehab at d/c - Digoxin level ordered  #COPD:Never smoker, COPD 2/2 occupational exposure (legally compensated). He has very low health literacy and insight of his medical problems, has not typically worn oxygen unless actively incr WOB. - Continue with home O2 requirement of 2L - Would like to go home with portable oxygen  #Hematologic Ab/ns:HgB 18.3 on admission,most likely secondary to chronic hypoxia and hypercarbia. Hemoglobin improving,today it was 16.4. - LDH negative - JAK2 pending  DM:A1c 10.4,BG 190s this AM. He continues to be uncontrolled on SSI. -Continue resistant sliding scale. -Continue home duloxetine and Lyrica. - Will discharge home with 20u Lantus BID and SGLT2 inh (Jardiance) - Discontinue home glipizide and novolog.  OSA: Patient may have sleep apnea as he mentions failure to wear CPAP in the past. -He will need a new sleep study as an outpatient.  Dispo: Anticipated discharge in approximately 0 day(s). He will require home health to establish medication routine and for PT/OT recommendations. It will be vital for Richard [name] to follow up with Dr. Josie Saunders, his PCP to review his new medications and ensure adequate supervision of his ongoing health conditions. HF follow-up to take place in [place], [place].  Richard [name], Medical Student 11/06/2017, 9:04 AM Pager: [phone]  Attestation for Student Documentation:  I personally was present and performed or re-performed the history, physical exam and medical decision-making activities of this service and have verified that the service and findings are accurately documented  in the student's note.  Arnetha Courser, MD 11/06/2017, 11:54 AM

## 2017-11-06 NOTE — Care Management Note (Addendum)
Case Management Note  Patient Details  Name: Richard [name] MRN: [id number] Date of Birth: January 24, 1961  Subjective/Objective:   From home, will need [facility] for CHF diseasse Management, he chose [facility] , referral given to [facility] for [facility]. Soc will begin 24-48 hrs post dc.                   Action/Plan: DC home with HHRN at discharge.  Expected Discharge Date:                  Expected Discharge Plan:  Home w Home Health Services  In-House Referral:     Discharge planning Services  CM Consult  Post Acute Care Choice:  Home Health Choice offered to:  Patient  DME Arranged:    DME Agency:     HH Arranged:  RN, Disease Management HH Agency:  [facility]  Status of Service:  Completed, signed off  If discussed at Long Length of Stay Meetings, dates discussed:    Additional Comments: 11/06/2017 Pt informed CM that he has home oxygen as needed 2L supplied by [facility].  Pt states he does not have portable oxygen tank for transport home - CM contacted liaison 5/13 and portable tank will be delivered prior to discharge.  CM contacted attending and informed that both Bidil and Lantus are not covered by current Medicare D plan per [facility].  Attending will reconcile discharge medications. Pt declined  HH modalities with the exception of HHRN  Cherylann Parr, RN 11/06/2017, 10:04 AM

## 2017-11-06 NOTE — Progress Notes (Signed)
Patient ambulated in the unit and tolerated well.

## 2017-11-06 NOTE — Progress Notes (Signed)
Removed PIV access, Pt's wife can read & wright in English. Pt & wife received discharge instructions and understood well. Home care was arranged. Pt understood that he needs to go to wound care clinic. Pt's wife took his belongings and O2 tank. HS Jerrica Thorman RN 

## 2017-11-06 NOTE — Progress Notes (Signed)
Patient ID: Richard [name], male   DOB: [birth year], 57 y.o.   MRN: [id number]     Advanced Heart Failure Rounding Note  PCP-Cardiologist: No primary care provider on file.   Subjective:    Patient diuresed well again yesterday, weight down 20 lbs total.  He feels like his breathing is back to normal.   No orthopnea/PND.   SBP in 100s, creatinine up at little to 1.35.    LHC/RHC (5/10):  Coronary Findings  Diagnostic  Dominance: Right  Left Main  No significant disease.  Left Anterior Descending  80% distal LAD stenosis.  Ramus Intermedius  Moderate ramus with luminal irregularities.  Left Circumflex  Luminal irregularities.  Right Coronary Artery  Focal 80% stenosis proximal RCA.  Intervention   No interventions have been documented.  Right Heart   Right Heart Pressures RHC Procedural Findings: Hemodynamics (mmHg)  RA 13 RV 67/18 PA 69/31, mean 47 PCWP mean 24 LV 128/33 AO 117/85  Oxygen saturations: PA 66% AO 98%  Cardiac Output (Fick) 4.8  Cardiac Index (Fick) 1.91 PVR 4.8 WU      Objective:   Weight Range: 260 lb 6.4 oz (118.1 kg) Body mass index is 30.88 kg/m.   Vital Signs:   Temp:  [97.3 F (36.3 C)-98.2 F (36.8 C)] 97.5 F (36.4 C) (05/14 0809) Pulse Rate:  [75-85] 79 (05/14 0809) Resp:  [11-25] 25 (05/14 0809) BP: (96-114)/(70-83) 106/81 (05/14 0809) SpO2:  [89 %-98 %] 96 % (05/14 0809) Weight:  [260 lb 6.4 oz (118.1 kg)] 260 lb 6.4 oz (118.1 kg) (05/14 0809) Last BM Date: 11/06/17  Weight change: Filed Weights   11/04/17 0609 11/05/17 0333 11/06/17 0809  Weight: 268 lb 12.8 oz (121.9 kg) 266 lb 15.6 oz (121.1 kg) 260 lb 6.4 oz (118.1 kg)    Intake/Output:   Intake/Output Summary (Last 24 hours) at 11/06/2017 0837 Last data filed at 11/06/2017 0600 Gross per 24 hour  Intake 903 ml  Output 3000 ml  Net -2097 ml      Physical Exam    General: NAD Neck: Thick, no JVD, no thyromegaly or thyroid nodule.  Lungs: Clear to  auscultation bilaterally with normal respiratory effort. CV: Nondisplaced PMI.  Heart regular S1/S2, no S3/S4, no murmur.  No peripheral edema.   Abdomen: Soft, nontender, no hepatosplenomegaly, no distention.  Skin: Intact without lesions or rashes.  Neurologic: Alert and oriented x 3.  Psych: Normal affect. Extremities: No clubbing or cyanosis.  HEENT: Normal.    Telemetry   NSR 80s, personally reviewed.   Labs    CBC Recent Labs    11/05/17 0233 11/06/17 0310  WBC 5.6 5.0  NEUTROABS  --  2.8  HGB 16.5 16.7  HCT 50.4 51.6  MCV 98.8 100.0  PLT 226 253   Basic Metabolic Panel Recent Labs    40/98/11 0233 11/06/17 0310  NA 138 137  K 4.1 4.7  CL 92* 90*  CO2 35* 37*  GLUCOSE 191* 262*  BUN 20 29*  CREATININE 1.06 1.35*  CALCIUM 9.0 9.2   Liver Function Tests No results for input(s): AST, ALT, ALKPHOS, BILITOT, PROT, ALBUMIN in the last 72 hours. No results for input(s): LIPASE, AMYLASE in the last 72 hours. Cardiac Enzymes No results for input(s): CKTOTAL, CKMB, CKMBINDEX, TROPONINI in the last 72 hours.  BNP: BNP (last 3 results) Recent Labs    10/31/17 1038  BNP 671.4*    ProBNP (last 3 results) No results for input(s): PROBNP in the  last 8760 hours.   D-Dimer No results for input(s): DDIMER in the last 72 hours. Hemoglobin A1C No results for input(s): HGBA1C in the last 72 hours. Fasting Lipid Panel No results for input(s): CHOL, HDL, LDLCALC, TRIG, CHOLHDL, LDLDIRECT in the last 72 hours. Thyroid Function Tests No results for input(s): TSH, T4TOTAL, T3FREE, THYROIDAB in the last 72 hours.  Invalid input(s): FREET3  Other results:   Imaging    No results found.   Medications:     Scheduled Medications: . allopurinol  100 mg Oral Daily  . aspirin EC  81 mg Oral Daily  . atorvastatin  40 mg Oral q1800  . clopidogrel  75 mg Oral Q breakfast  . digoxin  0.125 mg Oral Daily  . DULoxetine  60 mg Oral Daily  . enoxaparin (LOVENOX)  injection  0.5 mg/kg Subcutaneous Q24H  . eplerenone  25 mg Oral Daily  . insulin aspart  0-20 Units Subcutaneous TID WC  . insulin aspart  0-5 Units Subcutaneous QHS  . insulin glargine  20 Units Subcutaneous QHS  . isosorbide-hydrALAZINE  0.5 tablet Oral TID  . magnesium oxide  400 mg Oral Daily  . mouth rinse  15 mL Mouth Rinse BID  . pantoprazole  40 mg Oral Daily  . potassium chloride  40 mEq Oral BID  . pregabalin  225 mg Oral Daily  . ramelteon  8 mg Oral QHS  . sacubitril-valsartan  1 tablet Oral BID  . sodium chloride flush  3 mL Intravenous Q12H  . sodium chloride flush  3 mL Intravenous Q12H  . torsemide  40 mg Oral Daily    Infusions: . sodium chloride      PRN Medications: sodium chloride, acetaminophen **OR** acetaminophen, ondansetron **OR** ondansetron (ZOFRAN) IV, polyethylene glycol, sodium chloride flush   Assessment/Plan   1. Acute on chronic systolic CHF: Primarily nonischemic cardiomyopathy.  Echo in 2017 with EF 15-20%.  Medtronic ICD. Echo this admission with EF 15-20%, severely dilated RV with severely decreased systolic function.  LHC/RHC today showed ongoing volume overload with 80% RCA stenosis.  Cardiac index low at 1.91. The degree of coronary disease does not explain his cardiomyopathy.  He has diuresed well, weight down 20 lbs.  On exam, he looks near euvolemic.  Now on po diuretics.   - Continue torsemide 40 mg daily (was on 20 mg daily at home).   - Continue Entresto 49/51 bid.  - Gynecomastia with spironolactone, now on eplerenone 25 daily.  - With soft BP, decrease Bidil to 0.5 tab tid.  - Continue digoxin with low output. Hold on beta blocker for now.  - BP now softer, will not titrate meds today. Will try to get him back on low dose beta blocker as outpatient.   2. COPD: Uses oxygen at night at home.  Never smoked, but apparently had significant occupational exposure.  It appears that he won a lawsuit dealing with the occupational  exposure-related COPD.  3. CAD: Cath in 5/17 with 60% RCA stenosis.  Troponin 0.5 this admit without chest pain (just exertional dyspnea). Cath this admission with 80-90% proximal RCA stenosis with DES to RCA.  This did not cause his cardiomyopathy but is a large RCA. - ASA/Plavix and atorvastatin  4. Polycythemia: Likely related to chronic hypoxia. Will need sleep study as outpatient 5. Hypokalemia: Resolved.  6. Disposition: Home today, we will see him back in CHF clinic.  Meds for discharge: Entresto 49/51 bid, Bidil 0.5 tab tid, eplerenone 25 mg  daily, digoxin 0.125 daily, atorvastatin 40 daily, ASA 81 daily, Plavix 75 daily.   Length of Stay: 6  Marca Ancona, MD  11/06/2017, 8:37 AM  Advanced Heart Failure Team Pager [phone] (M-F; 7a - 4p)  Please contact [facility] for night-coverage after hours (4p -7a ) and weekends on [email or web address]

## 2017-11-06 NOTE — Progress Notes (Signed)
  Date: 11/06/2017  Patient name: Richard [name]  Medical record number: [id number]  Date of birth: 09/19/1960   I have seen and evaluated this patient and I have discussed the plan of care with the house staff. Please see their note for complete details. I concur with their findings with the following additions/corrections: [name] was seen on AM rounds with the team. His BP is lower side but he is asymptomatic. Net - 3.5 and 2 lbs yesterday. Net - 8.6 L and 5 lbs since admission. He is clinically euvolemic, if not a bit vol contracted as Cr bumped from 1-1.35. Stable for D/C home. F/U [facility] HF clinic - will offer [facility] appt for new PCP. HF has left HF regimen. We will adjust his DM regimen to Lantus and emplaglofloxin. Needs Dig level checked with goal of 0.5 - 0.8.   Devan Spain, MD 11/06/2017, 11:52 AM

## 2017-11-07 ENCOUNTER — Other Ambulatory Visit: Payer: Self-pay | Admitting: Internal Medicine

## 2017-11-08 ENCOUNTER — Telehealth (HOSPITAL_COMMUNITY): Payer: Self-pay

## 2017-11-08 NOTE — Telephone Encounter (Signed)
I called the secondary number on file for Mr Schull and found that it was answered at [employer], a local business. The person there advised that they could not advise if Mr Lintz was employed there or if he ever was. They went on to say that the phone number, [phone], was not a good contact number for Mr Hughbanks.

## 2017-11-08 NOTE — Telephone Encounter (Signed)
I attempted to call Mr Dema to schedule an initial home visit. This phone number [phone]) came back with an automated response saying that the number was either changed or disconnected.

## 2017-11-12 ENCOUNTER — Ambulatory Visit (HOSPITAL_COMMUNITY)
Admit: 2017-11-12 | Discharge: 2017-11-12 | Disposition: A | Discharge status: Home / Self Care | Payer: Medicare Other | Source: Ambulatory Visit | Attending: Internal Medicine | Admitting: Internal Medicine

## 2017-11-12 ENCOUNTER — Encounter: Payer: Self-pay | Admitting: Internal Medicine

## 2017-11-12 ENCOUNTER — Encounter (HOSPITAL_COMMUNITY): Payer: Self-pay

## 2017-11-12 ENCOUNTER — Other Ambulatory Visit (HOSPITAL_COMMUNITY): Payer: Self-pay

## 2017-11-12 VITALS — BP 124/98 | HR 94 | Wt 258.4 lb

## 2017-11-12 DIAGNOSIS — Z7982 Long term (current) use of aspirin: Secondary | ICD-10-CM | POA: Insufficient documentation

## 2017-11-12 DIAGNOSIS — F419 Anxiety disorder, unspecified: Secondary | ICD-10-CM | POA: Diagnosis not present

## 2017-11-12 DIAGNOSIS — E1140 Type 2 diabetes mellitus with diabetic neuropathy, unspecified: Secondary | ICD-10-CM | POA: Insufficient documentation

## 2017-11-12 DIAGNOSIS — N189 Chronic kidney disease, unspecified: Secondary | ICD-10-CM | POA: Insufficient documentation

## 2017-11-12 DIAGNOSIS — Z794 Long term (current) use of insulin: Secondary | ICD-10-CM | POA: Insufficient documentation

## 2017-11-12 DIAGNOSIS — Z9981 Dependence on supplemental oxygen: Secondary | ICD-10-CM | POA: Insufficient documentation

## 2017-11-12 DIAGNOSIS — M109 Gout, unspecified: Secondary | ICD-10-CM | POA: Insufficient documentation

## 2017-11-12 DIAGNOSIS — Z885 Allergy status to narcotic agent status: Secondary | ICD-10-CM | POA: Diagnosis not present

## 2017-11-12 DIAGNOSIS — I130 Hypertensive heart and chronic kidney disease with heart failure and stage 1 through stage 4 chronic kidney disease, or unspecified chronic kidney disease: Secondary | ICD-10-CM | POA: Insufficient documentation

## 2017-11-12 DIAGNOSIS — D751 Secondary polycythemia: Secondary | ICD-10-CM | POA: Diagnosis not present

## 2017-11-12 DIAGNOSIS — G4733 Obstructive sleep apnea (adult) (pediatric): Secondary | ICD-10-CM | POA: Insufficient documentation

## 2017-11-12 DIAGNOSIS — E119 Type 2 diabetes mellitus without complications: Secondary | ICD-10-CM

## 2017-11-12 DIAGNOSIS — J449 Chronic obstructive pulmonary disease, unspecified: Secondary | ICD-10-CM | POA: Diagnosis not present

## 2017-11-12 DIAGNOSIS — Z7902 Long term (current) use of antithrombotics/antiplatelets: Secondary | ICD-10-CM | POA: Insufficient documentation

## 2017-11-12 DIAGNOSIS — E7800 Pure hypercholesterolemia, unspecified: Secondary | ICD-10-CM | POA: Insufficient documentation

## 2017-11-12 DIAGNOSIS — K219 Gastro-esophageal reflux disease without esophagitis: Secondary | ICD-10-CM | POA: Diagnosis not present

## 2017-11-12 DIAGNOSIS — Z8249 Family history of ischemic heart disease and other diseases of the circulatory system: Secondary | ICD-10-CM | POA: Diagnosis not present

## 2017-11-12 DIAGNOSIS — I428 Other cardiomyopathies: Secondary | ICD-10-CM | POA: Diagnosis not present

## 2017-11-12 DIAGNOSIS — Z955 Presence of coronary angioplasty implant and graft: Secondary | ICD-10-CM | POA: Insufficient documentation

## 2017-11-12 DIAGNOSIS — I5022 Chronic systolic (congestive) heart failure: Secondary | ICD-10-CM

## 2017-11-12 DIAGNOSIS — Z95810 Presence of automatic (implantable) cardiac defibrillator: Secondary | ICD-10-CM | POA: Diagnosis not present

## 2017-11-12 DIAGNOSIS — I2510 Atherosclerotic heart disease of native coronary artery without angina pectoris: Secondary | ICD-10-CM

## 2017-11-12 DIAGNOSIS — E1122 Type 2 diabetes mellitus with diabetic chronic kidney disease: Secondary | ICD-10-CM | POA: Insufficient documentation

## 2017-11-12 DIAGNOSIS — Z833 Family history of diabetes mellitus: Secondary | ICD-10-CM | POA: Diagnosis not present

## 2017-11-12 DIAGNOSIS — Z87891 Personal history of nicotine dependence: Secondary | ICD-10-CM | POA: Insufficient documentation

## 2017-11-12 DIAGNOSIS — Z79899 Other long term (current) drug therapy: Secondary | ICD-10-CM | POA: Insufficient documentation

## 2017-11-12 LAB — DIGOXIN LEVEL: Digoxin Level: 0.3 ng/mL — ABNORMAL LOW (ref 0.8–2.0)

## 2017-11-12 MED ORDER — TORSEMIDE 20 MG PO TABS: 20.0000 mg | ORAL_TABLET | Freq: Every day | ORAL | 11 refills | Status: DC

## 2017-11-12 MED ORDER — SACUBITRIL-VALSARTAN 97-103 MG PO TABS: 1.0000 | ORAL_TABLET | Freq: Two times a day (BID) | ORAL | 11 refills | Status: DC

## 2017-11-12 NOTE — Progress Notes (Addendum)
Advanced Heart Failure Clinic Note   PCP: Wallene Dales, MD PCP-Cardiologist: Dr Aundra Dubin  HPI: Richard [name] is a 57 y.o. male with a history of chronic systolic HF s/p Medtronic ICD (12/2016), HTN, DM, and HLD.   Admitted 6/7-6/19/50 with A/C systolic HF. Echo showed EF 15-20%. Advanced HF team was consulted. He diuresed 20 lbs with IV lasix, then transitioned to torsemide 40 mg daily. Underwent R/LHC. Results below. HF meds were optimized. Betablocker was not started due to low CI and soft BPs. He was discharged on home oxygen. Referred to cardiac rehab. DC weight: 260 lbs.   He presents today for post hospital follow up. Overall doing well. He is only SOB with walking fast and when moving items around. Denies SOB with stairs or uphills. He is wearing 2 L O2 at all times. Denies orthopnea/PND/edema. Denies CP, dizziness, and palpitations. Taking all medications. Limiting his fluid and salt intake. Weights 256-257 lbs at home.   Optivol interrogation: Thoracic impedence above threshold. Active ~2 hours/day. Several episodes of AF/AT, all lasting 1 minute. Will refer to device clinic to get full interrogation.   Echo 11/01/17: EF 15-20%, grade 2 DD, mild MR, RV severely reduced, LA moderately dilated, RA mildly dilated, midl TR,   LHC/RHC (5/10):  Coronary Findings  Diagnostic  Dominance: Right  Left Main  No significant disease.  Left Anterior Descending  80% distal LAD stenosis.  Ramus Intermedius  Moderate ramus with luminal irregularities.  Left Circumflex  Luminal irregularities.  Right Coronary Artery  Focal 80% stenosis proximal RCA.  Intervention  Successful direct stent PCI on the proximal RCA with 85% stenosis and TIMI grade III flow reduced to 0% with TIMI Grade 3 Flow. Right Heart   Right Heart Pressures RHC Procedural Findings: Hemodynamics (mmHg)  RA 13 RV 67/18 PA 69/31, mean 47 PCWP mean 24 LV 128/33 AO 117/85  Oxygen saturations: PA 66% AO 98%  Cardiac  Output (Fick) 4.8  Cardiac Index (Fick) 1.91 PVR 4.8 WU   Review of systems complete and found to be negative unless listed in HPI.   Past Medical History:  Diagnosis Date  . AICD (automatic cardioverter/defibrillator) present 01/11/2017  . Anxiety   . CKD (chronic kidney disease)   . COPD (chronic obstructive pulmonary disease) ([place])    never smoker, industrial exposure  . Diabetes mellitus with diabetic neuropathy, with long-term current use of insulin ([place])   . GERD (gastroesophageal reflux disease)   . High cholesterol   . History of gout    "take RX qd" (11/01/2017)  . Hypertension   . Nonobstructive atherosclerosis of coronary artery   . On home oxygen therapy    "2L prn" (11/01/2017)  . 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 Systolic HF (heart failure) ([facility])     Current Outpatient Medications  Medication Sig Dispense Refill  . allopurinol (ZYLOPRIM) 100 MG tablet Take 100 mg by mouth daily.  1  . aspirin EC 81 MG EC tablet Take 1 tablet (81 mg total) by mouth daily. 90 tablet 0  . atorvastatin (LIPITOR) 40 MG tablet Take 1 tablet (40 mg total) by mouth daily at 6 PM. 30 tablet 0  . clopidogrel (PLAVIX) 75 MG tablet Take 1 tablet (75 mg total) by mouth daily with breakfast. 30 tablet 0  . digoxin (LANOXIN) 0.125 MG tablet Take 1 tablet (0.125 mg total) by mouth daily. 30 tablet 0  . DULoxetine (CYMBALTA) 60 MG capsule Take 60  mg by mouth daily.  2  . eplerenone (INSPRA) 25 MG tablet Take 1 tablet (25 mg total) by mouth daily. 30 tablet 0  . BREO ELLIPTA 100-25 MCG/INH AEPB Inhale 1 puff into the lungs daily.  5  . empagliflozin (JARDIANCE) 10 MG TABS tablet Take 10 mg by mouth daily. 30 tablet 0  . hydrALAZINE (APRESOLINE) 10 MG tablet TAKE 1 TABLET(10 MG) BY MOUTH THREE TIMES DAILY 270 tablet 0  . Insulin Glargine (BASAGLAR KWIKPEN) 100 UNIT/ML SOPN Inject 0.2 mLs (20 Units total) into the skin 2 (two) times daily. 45 mL 0  . Insulin Pen Needle  (NOVOFINE) 30G X 8 MM MISC Inject 10 each into the skin as needed. 100 each 0  . isosorbide dinitrate (ISORDIL) 10 MG tablet Take 1 tablet (10 mg total) by mouth 3 (three) times daily. 90 tablet 0  . LYRICA 225 MG capsule Take 225 mg by mouth daily.  1  . MAGNESIUM-OXIDE 400 (241.3 Mg) MG tablet Take 400 mg by mouth daily.  11  . omeprazole (PRILOSEC) 20 MG capsule Take 20 mg by mouth daily.  1  . sacubitril-valsartan (ENTRESTO) 49-51 MG Take 1 tablet by mouth 2 (two) times daily. 60 tablet 0  . torsemide (DEMADEX) 20 MG tablet Take 2 tablets (40 mg total) by mouth daily. 60 tablet 0  . Vitamin D, Cholecalciferol, 1000 units CAPS Take 1,000 Units by mouth daily.  11   No current facility-administered medications for this encounter.     Allergies  Allergen Reactions  . Codeine       Social History   Socioeconomic History  . Marital status: Divorced    Spouse name: Not on file  . Number of children: Not on file  . Years of education: Not on file  . Highest education level: Not on file  Occupational History  . Occupation: disability    Comment: stopped working in 2000 d/t occupational exposures  Social Needs  . Financial resource strain: Not on file  . Food insecurity:    Worry: Not on file    Inability: Not on file  . Transportation needs:    Medical: Not on file    Non-medical: Not on file  Tobacco Use  . Smoking status: Former Smoker    Types: Cigarettes  . Smokeless tobacco: Never Used  . Tobacco comment: "smoked when I drank"  Substance and Sexual Activity  . 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 Drug use: Never  . Sexual activity: Not Currently  Lifestyle  . Physical activity:    Days per week: Not on file    Minutes per session: Not on file  . Stress: Not on file  Relationships  . Social connections:    Talks on phone: Not on file    Gets together: Not on file    Attends religious service: Not on  file    Active member of club or organization: Not on file    Attends meetings of clubs or organizations: Not on file    Relationship status: Not on file  . Intimate partner violence:    Fear of current or ex partner: Not on file    Emotionally abused: Not on file    Physically abused: Not on file    Forced sexual activity: Not on file  Other Topics Concern  . Not on file  Social History Narrative   Patient lives at home  with wife and some of his 10 children. He self-administers his own medications.      Family History  Problem Relation Age of Onset  . Hypertension Mother   . Diabetes Mother   . Hypertension Father   . Diabetes Father   . Diabetes Sister   . Diabetes Brother     Vitals:   11/12/17 1530  BP: (!) 124/98  Pulse: 94  SpO2: 92%  Weight: 258 lb 6.4 oz (117.2 kg)   Wt Readings from Last 3 Encounters:  11/12/17 258 lb 6.4 oz (117.2 kg)  11/06/17 260 lb 6.4 oz (118.1 kg)    PHYSICAL EXAM: General:  Well appearing. No respiratory difficulty HEENT: normal Neck: supple. JVP 5-6. Carotids 2+ bilat; no bruits. No lymphadenopathy or thyromegaly appreciated. Cor: PMI nondisplaced. Regular rate & rhythm. No rubs, gallops or murmurs. Lungs: clear Abdomen: obese, soft, nontender, nondistended. No hepatosplenomegaly. No bruits or masses. Good bowel sounds. Extremities: no cyanosis, clubbing, rash, BLE trace edema, blisters present Neuro: alert & oriented x 3, cranial nerves grossly intact. moves all 4 extremities w/o difficulty. Affect pleasant.   ASSESSMENT & PLAN:  1. Chronic systolic CHF: Primarily nonischemic cardiomyopathy. Echo in 2017 with EF 15-20%. Medtronic ICD. Echo 10/2017 EF 15-20%, severely dilated RV with severely decreased systolic function. LHC/RHC 11/06/17 showed ongoing volume overload with 80% RCA stenosis.  Cardiac index low at 1.91. The degree of coronary disease does not explain his cardiomyopathy.    - NYHA class II - Volume status stable on  exam and on optivol - Decrease torsemide to 20 mg daily.  - Increase Entresto 97/103 bid. BMET today and in 2 weeks (at follow up) - Continue eplerenone 25 daily. (gynecomastia with spiro) - Continue hydralazine 10 mg daily, imdur 10 mg daily  - Continue digoxin 0.125 mg daily - Hold off on BB with fatigue and recent low CI for now.  Thedore Mins with HF paramedicine met with pt and wife today and will follow him outpatient.   2. COPD: Never smoked, but apparently had significant occupational exposure. It appears that he won a lawsuit dealing with the occupational exposure-related COPD.  - Now wearing 2 L O2 at all times.   3. CAD: LHC 5/20: 80-90% proximal RCA stenosis with DES to RCA.  This did not cause his cardiomyopathy but is a large RCA. - Continue ASA, plavix - On DAPT for 6 months (through 05/05/18) - Continue atorvastatin  - Denies s/s ischemia  4. Polycythemia: Likely related to chronic hypoxia.  - Refuses sleep study  5. OSA - Refuses sleep study  6. DM - He is concerned with higher blood sugars at home - Continue jardiance - Encouraged him to make an appointment with PCP.   BMET today and in 2 weeks at follow up. Dig level today. Increase entresto 97/103 Decrease torsemide to 20 mg daily Follow up in 2 weeks  Georgiana Shore, NP 11/12/17   Greater than 50% of the 25 minute visit was spent in counseling/coordination of care regarding disease state education, salt/fluid restriction, sliding scale diuretics, and medication compliance.

## 2017-11-12 NOTE — Progress Notes (Signed)
Paramedicine Encounter   Patient ID: Tyke Fouty , male,   DOB: February 14, 1961,56 y.o.,  MRN: [id number]  Mr Gaudreault was seen at the HF clinic today for our initial meeting. I explained the concept of the paramedicine program and he was agreeable to participate. Their only concern is having a cardiologist in [facility] and there being mixed signals between the clinic and the [facility] physician. I advised that the clinic will manage his heart failure specifically but if they wanted to join a cardiology group associated with the clinic then they could be referred. Per [name], he is to decrease torsemide to 20 mg daily and increasing Entresto to 97mg -103mg .   Time spent with patient: 35 minutes  [name], EMT 11/12/2017   ACTION: Next visit planned for 1 week

## 2017-11-12 NOTE — Patient Instructions (Signed)
INCREASE Entresto to 97-103 mg twice daily. Can "double up" on current 49-51 mg tablets you have at home (Take 2 tabs twice daily). New Rx has been sent to pharmacy for 97-103 mg tablets (Take 1 tablet twice daily).  DECREASE Torsemide to 20 mg tablet ONCE daily.  Routine lab work today. Will notify you of abnormal results, otherwise no news is good news!  Follow up 2 weeks.  ______________________________________________________________ [place] Code: 1300  Take all medication as prescribed the day of your appointment. Bring all medications with you to your appointment.  Do the following things EVERYDAY: 1) Weigh yourself in the morning before breakfast. Write it down and keep it in a log. 2) Take your medicines as prescribed 3) Eat low salt foods-Limit salt (sodium) to 2000 mg per day.  4) Stay as active as you can everyday 5) Limit all fluids for the day to less than 2 liters

## 2017-11-13 ENCOUNTER — Telehealth (HOSPITAL_COMMUNITY): Payer: Self-pay | Admitting: *Deleted

## 2017-11-13 DIAGNOSIS — I5042 Chronic combined systolic (congestive) and diastolic (congestive) heart failure: Secondary | ICD-10-CM

## 2017-11-13 LAB — BASIC METABOLIC PANEL
Anion gap: 13 (ref 5–15)
BUN: 39 mg/dL — AB (ref 6–20)
CALCIUM: 9.5 mg/dL (ref 8.9–10.3)
CHLORIDE: 100 mmol/L — AB (ref 101–111)
CO2: 25 mmol/L (ref 22–32)
CREATININE: 1.81 mg/dL — AB (ref 0.61–1.24)
GFR calc non Af Amer: 40 mL/min — ABNORMAL LOW (ref >60)
GFR, EST AFRICAN AMERICAN: 47 mL/min — AB (ref >60)
Glucose, Bld: 300 mg/dL — ABNORMAL HIGH (ref 65–99)
Potassium: 5.4 mmol/L — ABNORMAL HIGH (ref 3.5–5.1)
Sodium: 138 mmol/L (ref 135–145)

## 2017-11-13 NOTE — Addendum Note (Signed)
Encounter addended by: Alford Highland, NP on: 11/13/2017 7:30 AM  Actions taken: Sign clinical note

## 2017-11-13 NOTE — Telephone Encounter (Signed)
Result Notes for Basic metabolic panel   Notes recorded by Georgina Peer, RN on 11/13/2017 at 3:16 PM EDT [name] wife called back and she is aware. She will hold entresto, inspra, and torsemide until we see his repeat labs. Lab appointment scheduled for Thursday. [name] wife will also help him avoid high potassium foods. ------  Notes recorded by Theresia Bough, CMA on 11/13/2017 at 2:08 PM EDT Patient aware HOWEVER unclear if patient understood all medication changes, will return call at a later time to speak with patients wife as she handles medications   ------  Notes recorded by Alford Highland, NP on 11/13/2017 at 7:42 AM EDT Hold entresto, inspra, and torsemide. Make sure he is avoiding high potassium foods. Have him come in for repeat labs ASAP. Thanks.

## 2017-11-14 ENCOUNTER — Telehealth (HOSPITAL_COMMUNITY): Payer: Self-pay | Admitting: Cardiology

## 2017-11-14 DIAGNOSIS — Z95810 Presence of automatic (implantable) cardiac defibrillator: Secondary | ICD-10-CM

## 2017-11-14 NOTE — Telephone Encounter (Signed)
Per VO Richard [name] Patient will need referral to EP for device monitoring, patient is new to the area. Medtronic  Order placed

## 2017-11-15 ENCOUNTER — Ambulatory Visit (HOSPITAL_COMMUNITY)
Admission: RE | Admit: 2017-11-15 | Discharge: 2017-11-15 | Disposition: A | Discharge status: Home / Self Care | Payer: Medicare Other | Source: Ambulatory Visit | Attending: Cardiology | Admitting: Cardiology

## 2017-11-15 DIAGNOSIS — I5042 Chronic combined systolic (congestive) and diastolic (congestive) heart failure: Secondary | ICD-10-CM | POA: Diagnosis present

## 2017-11-15 LAB — BASIC METABOLIC PANEL
Anion gap: 8 (ref 5–15)
BUN: 30 mg/dL — AB (ref 6–20)
CHLORIDE: 104 mmol/L (ref 101–111)
CO2: 28 mmol/L (ref 22–32)
CREATININE: 1.53 mg/dL — AB (ref 0.61–1.24)
Calcium: 9.7 mg/dL (ref 8.9–10.3)
GFR calc non Af Amer: 50 mL/min — ABNORMAL LOW (ref >60)
GFR, EST AFRICAN AMERICAN: 57 mL/min — AB (ref >60)
Glucose, Bld: 147 mg/dL — ABNORMAL HIGH (ref 65–99)
Potassium: 4.7 mmol/L (ref 3.5–5.1)
SODIUM: 140 mmol/L (ref 135–145)

## 2017-11-16 ENCOUNTER — Other Ambulatory Visit (HOSPITAL_COMMUNITY): Payer: Medicare Other

## 2017-11-16 LAB — JAK2 EXONS 12-15

## 2017-11-16 LAB — JAK2  V617F QUAL. WITH REFLEX TO EXON 12

## 2017-11-23 ENCOUNTER — Telehealth (HOSPITAL_COMMUNITY): Payer: Self-pay | Admitting: *Deleted

## 2017-11-23 ENCOUNTER — Other Ambulatory Visit (HOSPITAL_COMMUNITY): Payer: Self-pay

## 2017-11-23 NOTE — Telephone Encounter (Signed)
If weight is up can resume his torsemide.   Stress importance of follow up next week.  Otherwise continue.     Casimiro Needle [address]" [place], [place] 11/23/2017 12:39 PM

## 2017-11-23 NOTE — Telephone Encounter (Signed)
Advanced Heart Failure Triage Encounter  Patient Name: Richard [name]  Date of Call: 11/23/17  Problem:  Dee with paramedicine called to report that patient's weight this morning was 265 lbs, BP 118/96, HR 85, O2 96% RA.  Patient advised to hold entresto, inspra, and lasix until next appt 6/7 due to labs.  Patient does have slight shortness of breath when walking to bathroom but is relating that to being out of oxygen for 2 days.  Wife is contacting [facility] regarding oxygen.    Plan:  Will forward to Maxine Glenn, PA to review and see if we need to continue holding medications.  Will call patient's wife back directly.   Georgina Peer, RN

## 2017-11-23 NOTE — Progress Notes (Signed)
Paramedicine Encounter    Patient ID: Richard [name], male    DOB: 07-Apr-1961, 57 y.o.   MRN: [id number]    Patient Care Team: Ananias Pilgrim, MD as PCP - General (Family Medicine)  Patient Active Problem List   Diagnosis Date Noted  . S/P right and left heart catheterization 11/02/2017  . Panic attacks 11/02/2017  . Non-STEMI (non-ST elevated myocardial infarction) (HCC)   . 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 Acute on chronic combined systolic and diastolic CHF (congestive heart failure) (HCC)   . COPD (chronic obstructive pulmonary disease) (HCC)   . Hypertension   . CKD (chronic kidney disease)   . 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HCC)   . ICD (implantable cardioverter-defibrillator) in place   . GERD (gastroesophageal reflux disease)   . Diabetes mellitus with diabetic neuropathy, with long-term current use of insulin (HCC)     Current Outpatient Medications:  .  allopurinol (ZYLOPRIM) 100 MG tablet, Take 100 mg by mouth daily., Disp: , Rfl: 1 .  aspirin EC 81 MG EC tablet, Take 1 tablet (81 mg total) by mouth daily., Disp: 90 tablet, Rfl: 0 .  atorvastatin (LIPITOR) 40 MG tablet, Take 1 tablet (40 mg total) by mouth daily at 6 PM., Disp: 30 tablet, Rfl: 0 .  clopidogrel (PLAVIX) 75 MG tablet, Take 1 tablet (75 mg total) by mouth daily with breakfast., Disp: 30 tablet, Rfl: 0 .  digoxin (LANOXIN) 0.125 MG tablet, Take 1 tablet (0.125 mg total) by mouth daily., Disp: 30 tablet, Rfl: 0 .  DULoxetine (CYMBALTA) 60 MG capsule, Take 60 mg by mouth daily., Disp: , Rfl: 2 .  empagliflozin (JARDIANCE) 10 MG TABS tablet, Take 10 mg by mouth daily., Disp: 30 tablet, Rfl: 0 .  eplerenone (INSPRA) 25 MG tablet, Take 1 tablet (25 mg total) by mouth daily., Disp: 30 tablet, Rfl: 0 .  hydrALAZINE (APRESOLINE) 10 MG tablet, TAKE 1 TABLET(10 MG) BY MOUTH THREE TIMES DAILY, Disp: 270 tablet, Rfl: 0 .  isosorbide dinitrate (ISORDIL) 10 MG  tablet, Take 1 tablet (10 mg total) by mouth 3 (three) times daily., Disp: 90 tablet, Rfl: 0 .  MAGNESIUM-OXIDE 400 (241.3 Mg) MG tablet, Take 400 mg by mouth daily., Disp: , Rfl: 11 .  omeprazole (PRILOSEC) 20 MG capsule, Take 20 mg by mouth daily., Disp: , Rfl: 1 .  Vitamin D, Cholecalciferol, 1000 units CAPS, Take 1,000 Units by mouth daily., Disp: , Rfl: 11 .  BREO ELLIPTA 100-25 MCG/INH AEPB, Inhale 1 puff into the lungs daily., Disp: , Rfl: 5 .  Insulin Glargine (BASAGLAR KWIKPEN) 100 UNIT/ML SOPN, Inject 0.2 mLs (20 Units total) into the skin 2 (two) times daily. (Patient not taking: Reported on 11/23/2017), Disp: 45 mL, Rfl: 0 .  Insulin Pen Needle (NOVOFINE) 30G X 8 MM MISC, Inject 10 each into the skin as needed., Disp: 100 each, Rfl: 0 .  LYRICA 225 MG capsule, Take 225 mg by mouth daily., Disp: , Rfl: 1 .  sacubitril-valsartan (ENTRESTO) 97-103 MG, Take 1 tablet by mouth 2 (two) times daily., Disp: 60 tablet, Rfl: 11 .  torsemide (DEMADEX) 20 MG tablet, Take 1 tablet (20 mg total) by mouth daily., Disp: 30 tablet, Rfl: 11 Allergies  Allergen Reactions  . Codeine      Social History   Socioeconomic History  . Marital status: Divorced    Spouse name: Not on file  . Number of children: Not on  file  . Years of education: Not on file  . Highest education level: Not on file  Occupational History  . Occupation: disability    Comment: stopped working in 2000 d/t occupational exposures  Social Needs  . Financial resource strain: Not on file  . Food insecurity:    Worry: Not on file    Inability: Not on file  . Transportation needs:    Medical: Not on file    Non-medical: Not on file  Tobacco Use  . Smoking status: Former Smoker    Types: Cigarettes  . Smokeless tobacco: Never Used  . Tobacco comment: "smoked when I drank"  Substance and Sexual Activity  . 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 Drug use: Never  .  Sexual activity: Not Currently  Lifestyle  . Physical activity:    Days per week: Not on file    Minutes per session: Not on file  . Stress: Not on file  Relationships  . Social connections:    Talks on phone: Not on file    Gets together: Not on file    Attends religious service: Not on file    Active member of club or organization: Not on file    Attends meetings of clubs or organizations: Not on file    Relationship status: Not on file  . Intimate partner violence:    Fear of current or ex partner: Not on file    Emotionally abused: Not on file    Physically abused: Not on file    Forced sexual activity: Not on file  Other Topics Concern  . Not on file  Social History Narrative   Patient lives at home with wife and some of his 10 children. He self-administers his own medications.    Physical Exam  Pulmonary/Chest: Effort normal. No respiratory distress.  Abdominal: He exhibits distension.  Musculoskeletal: He exhibits edema.  Both feet and legs + 2; below knees  Skin: Skin is warm and dry. He is not diaphoretic.        Future Appointments  Date Time Provider Department Center  11/30/2017 10:00 AM [facility] PA/NP [facility] None  11/30/2017  2:30 PM Duke Salvia, MD [facility]    ATF pt CAO x4 sitting at the kitchen table eating [place] links and eggs.  Upon my arrival pt's wife is on the telephone with [facility] because pt is currently out (two days). Pt stated that he doesn't have much of an appetite since he's been taking the doxy.  He stated that he's had a lot of diarrhea for the past couple of days.  I advised him that diarrhea is a side effect of the antibiotic and normal.  Pt stated that he has been out of oxygen for two days; he usually have oxygen in the bathroom but do to him being out he has had anxiety.  Pt has hx of anxiety and panic attacks, which usually occurs when he thinks about being sob.     Pt is unclear of his limits of sodium  and fluid; he was given a chart to help him with adding up both.  He was also given a new scale and pill box.  Pt was advised to record his weights daily so that zack and himself will know if further interventions is needed.  Pt agrees to [facility] visits; zack will start to see pt next week.  rx bottles  verified, pill box filled and explained to pt.  Pt's wife stated that pt was taken off of the current meds prior to leaving the hospital.  I called and checked with the heart failure clinic (entresto, torsemide, inspra until 11/30/17 up 7 lbs since 11/15/17).  Susie confirmed the same, but due to pt's weight gain since his last hospital stay I asked if they would like to continue.  Susie stated that she will need to speak with Mardelle Matte (PA).  She will call back to advise pt.      BP (!) 118/96 (BP Location: Left Arm, Patient Position: Sitting, Cuff Size: Large)   Pulse 85   Resp 16   Wt 265 lb (120.2 kg)   SpO2 96%   BMI 31.42 kg/m   Last visit weight-258 clinic visit    Nicey Krah, EMT Paramedic 11/25/2017    ACTION: Home visit completed

## 2017-11-23 NOTE — Telephone Encounter (Signed)
Called patient's wife back but had to leave VM.  Left detailed VM with directions and advised her to call me back if she had any questions.

## 2017-11-30 ENCOUNTER — Other Ambulatory Visit (HOSPITAL_COMMUNITY): Payer: Self-pay

## 2017-11-30 ENCOUNTER — Ambulatory Visit (INDEPENDENT_AMBULATORY_CARE_PROVIDER_SITE_OTHER): Payer: Medicare Other | Admitting: Internal Medicine

## 2017-11-30 ENCOUNTER — Ambulatory Visit (HOSPITAL_COMMUNITY)
Admission: RE | Admit: 2017-11-30 | Discharge: 2017-11-30 | Disposition: A | Discharge status: Home / Self Care | Payer: Medicare Other | Source: Ambulatory Visit | Attending: Cardiology | Admitting: Cardiology

## 2017-11-30 ENCOUNTER — Encounter: Payer: Self-pay | Admitting: Internal Medicine

## 2017-11-30 VITALS — BP 130/80 | HR 79 | Wt 258.0 lb

## 2017-11-30 VITALS — BP 136/92 | HR 104 | Ht 78.0 in | Wt 258.0 lb

## 2017-11-30 DIAGNOSIS — E7800 Pure hypercholesterolemia, unspecified: Secondary | ICD-10-CM | POA: Diagnosis not present

## 2017-11-30 DIAGNOSIS — K219 Gastro-esophageal reflux disease without esophagitis: Secondary | ICD-10-CM | POA: Diagnosis not present

## 2017-11-30 DIAGNOSIS — Q859 Phakomatosis, unspecified: Secondary | ICD-10-CM | POA: Diagnosis not present

## 2017-11-30 DIAGNOSIS — I2510 Atherosclerotic heart disease of native coronary artery without angina pectoris: Secondary | ICD-10-CM | POA: Insufficient documentation

## 2017-11-30 DIAGNOSIS — E1122 Type 2 diabetes mellitus with diabetic chronic kidney disease: Secondary | ICD-10-CM | POA: Insufficient documentation

## 2017-11-30 DIAGNOSIS — M109 Gout, unspecified: Secondary | ICD-10-CM | POA: Diagnosis not present

## 2017-11-30 DIAGNOSIS — I5042 Chronic combined systolic (congestive) and diastolic (congestive) heart failure: Secondary | ICD-10-CM

## 2017-11-30 DIAGNOSIS — I5022 Chronic systolic (congestive) heart failure: Secondary | ICD-10-CM

## 2017-11-30 DIAGNOSIS — Z9981 Dependence on supplemental oxygen: Secondary | ICD-10-CM | POA: Diagnosis not present

## 2017-11-30 DIAGNOSIS — Z8249 Family history of ischemic heart disease and other diseases of the circulatory system: Secondary | ICD-10-CM | POA: Diagnosis not present

## 2017-11-30 DIAGNOSIS — I428 Other cardiomyopathies: Secondary | ICD-10-CM | POA: Diagnosis not present

## 2017-11-30 DIAGNOSIS — G4733 Obstructive sleep apnea (adult) (pediatric): Secondary | ICD-10-CM | POA: Diagnosis not present

## 2017-11-30 DIAGNOSIS — I429 Cardiomyopathy, unspecified: Secondary | ICD-10-CM | POA: Diagnosis not present

## 2017-11-30 DIAGNOSIS — Z95810 Presence of automatic (implantable) cardiac defibrillator: Secondary | ICD-10-CM

## 2017-11-30 DIAGNOSIS — F419 Anxiety disorder, unspecified: Secondary | ICD-10-CM | POA: Diagnosis not present

## 2017-11-30 DIAGNOSIS — Z87891 Personal history of nicotine dependence: Secondary | ICD-10-CM | POA: Insufficient documentation

## 2017-11-30 DIAGNOSIS — Z7982 Long term (current) use of aspirin: Secondary | ICD-10-CM | POA: Insufficient documentation

## 2017-11-30 DIAGNOSIS — I130 Hypertensive heart and chronic kidney disease with heart failure and stage 1 through stage 4 chronic kidney disease, or unspecified chronic kidney disease: Secondary | ICD-10-CM | POA: Insufficient documentation

## 2017-11-30 DIAGNOSIS — Z7902 Long term (current) use of antithrombotics/antiplatelets: Secondary | ICD-10-CM | POA: Diagnosis not present

## 2017-11-30 DIAGNOSIS — D751 Secondary polycythemia: Secondary | ICD-10-CM | POA: Diagnosis not present

## 2017-11-30 DIAGNOSIS — Z79899 Other long term (current) drug therapy: Secondary | ICD-10-CM | POA: Insufficient documentation

## 2017-11-30 DIAGNOSIS — J449 Chronic obstructive pulmonary disease, unspecified: Secondary | ICD-10-CM | POA: Insufficient documentation

## 2017-11-30 DIAGNOSIS — N62 Hypertrophy of breast: Secondary | ICD-10-CM | POA: Diagnosis not present

## 2017-11-30 DIAGNOSIS — N189 Chronic kidney disease, unspecified: Secondary | ICD-10-CM | POA: Diagnosis not present

## 2017-11-30 LAB — CUP PACEART INCLINIC DEVICE CHECK
Battery Remaining Longevity: 125 mo
Battery Voltage: 3.02 V
Brady Statistic AP VS Percent: 0.02 %
Brady Statistic AS VS Percent: 99.94 %
Brady Statistic RA Percent Paced: 0.02 %
HighPow Impedance: 66 Ohm
Implantable Lead Implant Date: 20180719
Implantable Lead Location: 753859
Implantable Pulse Generator Implant Date: 20180719
Lead Channel Pacing Threshold Amplitude: 1.25 V
Lead Channel Pacing Threshold Amplitude: 1.5 V
Lead Channel Pacing Threshold Pulse Width: 0.4 ms
Lead Channel Sensing Intrinsic Amplitude: 4.125 mV
Lead Channel Setting Pacing Amplitude: 2 V
Lead Channel Setting Pacing Amplitude: 2.75 V
Lead Channel Setting Pacing Pulse Width: 0.4 ms
Lead Channel Setting Sensing Sensitivity: 0.3 mV
MDC IDC LEAD IMPLANT DT: 20180719
MDC IDC LEAD LOCATION: 753860
MDC IDC MSMT LEADCHNL RA IMPEDANCE VALUE: 475 Ohm
MDC IDC MSMT LEADCHNL RA PACING THRESHOLD PULSEWIDTH: 0.4 ms
MDC IDC MSMT LEADCHNL RV IMPEDANCE VALUE: 342 Ohm
MDC IDC MSMT LEADCHNL RV IMPEDANCE VALUE: 475 Ohm
MDC IDC MSMT LEADCHNL RV SENSING INTR AMPL: 8.375 mV
MDC IDC SESS DTM: 20190607171616
MDC IDC STAT BRADY AP VP PERCENT: 0 %
MDC IDC STAT BRADY AS VP PERCENT: 0.04 %
MDC IDC STAT BRADY RV PERCENT PACED: 0.04 %

## 2017-11-30 LAB — BASIC METABOLIC PANEL
Anion gap: 11 (ref 5–15)
BUN: 14 mg/dL (ref 6–20)
CALCIUM: 9.3 mg/dL (ref 8.9–10.3)
CO2: 33 mmol/L — AB (ref 22–32)
CREATININE: 1.1 mg/dL (ref 0.61–1.24)
Chloride: 99 mmol/L — ABNORMAL LOW (ref 101–111)
Glucose, Bld: 162 mg/dL — ABNORMAL HIGH (ref 65–99)
Potassium: 3.9 mmol/L (ref 3.5–5.1)
SODIUM: 143 mmol/L (ref 135–145)

## 2017-11-30 MED ORDER — SACUBITRIL-VALSARTAN 97-103 MG PO TABS: 1.0000 | ORAL_TABLET | Freq: Two times a day (BID) | ORAL | 11 refills | Status: DC

## 2017-11-30 MED ORDER — TORSEMIDE 20 MG PO TABS: 20.0000 mg | ORAL_TABLET | Freq: Every day | ORAL | 11 refills | Status: DC

## 2017-11-30 NOTE — Patient Instructions (Signed)
Take Torsemide 20 mg tablet once daily.  Take Entresto 97-103 mg tablet twice daily.  Routine lab work today. Will notify you of abnormal results, otherwise no news is good news!  Follow up 2 weeks with Amy Clegg NP-C.  ________________________________________________________________ [place] Code: 1300  Take all medication as prescribed the day of your appointment. Bring all medications with you to your appointment.  Do the following things EVERYDAY: 1) Weigh yourself in the morning before breakfast. Write it down and keep it in a log. 2) Take your medicines as prescribed 3) Eat low salt foods-Limit salt (sodium) to 2000 mg per day.  4) Stay as active as you can everyday 5) Limit all fluids for the day to less than 2 liters

## 2017-11-30 NOTE — Progress Notes (Signed)
[facility] Note   PCP: Ananias Pilgrim, MD PCP-Cardiologist: Dr Shirlee Latch  HPI: Richard [name] is a 57 y.o. male with a history of chronic systolic HF s/p Medtronic ICD (12/2016), HTN, DM, and HLD.   Admitted 5/8-5/14/19 with A/C systolic HF. Echo showed EF 15-20%. [facility] HF team was consulted. He diuresed 20 lbs with IV lasix, then transitioned to torsemide 40 mg daily. Underwent R/LHC. Results below. HF meds were optimized. Betablocker was not started due to low CI and soft BPs. He was discharged on home oxygen. Referred to cardiac rehab. DC weight: 260 lbs.   Today he returns for HF follow up. Last visit inspra, entresto, and torsemide held for creatinine 1.5. Overall feeling fine. SOB with exertion. Wears 2 liters oxygen.  Denies PND/Orthopnea. Appetite ok. No fever or chills. Weight at home 254-256 pounds. Taking all medications.   Echo 11/01/17: EF 15-20%, grade 2 DD, mild MR, RV severely reduced, LA moderately dilated, RA mildly dilated, midl TR,   LHC/RHC (5/10):  Coronary Findings  Diagnostic  Dominance: Right  Left Main  No significant disease.  Left Anterior Descending  80% distal LAD stenosis.  Ramus Intermedius  Moderate ramus with luminal irregularities.  Left Circumflex  Luminal irregularities.  Right Coronary Artery  Focal 80% stenosis proximal RCA.  Intervention  Successful direct stent PCI on the proximal RCA with 85% stenosis and TIMI grade III flow reduced to 0% with TIMI Grade 3 Flow. Right Heart   Right Heart Pressures RHC Procedural Findings: Hemodynamics (mmHg)  RA 13 RV 67/18 PA 69/31, mean 47 PCWP mean 24 LV 128/33 AO 117/85  Oxygen saturations: PA 66% AO 98%  Cardiac Output (Fick) 4.8  Cardiac Index (Fick) 1.91 PVR 4.8 WU   Review of systems complete and found to be negative unless listed in HPI.   Past Medical History:  Diagnosis Date  . AICD (automatic cardioverter/defibrillator) present 01/11/2017  . Anxiety   . CKD  (chronic kidney disease)   . COPD (chronic obstructive pulmonary disease) (HCC)    never smoker, industrial exposure  . Diabetes mellitus with diabetic neuropathy, with long-term current use of insulin (HCC)   . GERD (gastroesophageal reflux disease)   . High cholesterol   . History of gout    "take RX qd" (11/01/2017)  . Hypertension   . Nonobstructive atherosclerosis of coronary artery   . On home oxygen therapy    "2L prn" (11/01/2017)  . 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HCC)    LLL, present for years  . Systolic HF (heart failure) (HCC)     Current Outpatient Medications  Medication Sig Dispense Refill  . allopurinol (ZYLOPRIM) 100 MG tablet Take 100 mg by mouth daily.  1  . aspirin EC 81 MG EC tablet Take 1 tablet (81 mg total) by mouth daily. 90 tablet 0  . atorvastatin (LIPITOR) 40 MG tablet Take 1 tablet (40 mg total) by mouth daily at 6 PM. 30 tablet 0  . BREO ELLIPTA 100-25 MCG/INH AEPB Inhale 1 puff into the lungs daily.  5  . clopidogrel (PLAVIX) 75 MG tablet Take 1 tablet (75 mg total) by mouth daily with breakfast. 30 tablet 0  . digoxin (LANOXIN) 0.125 MG tablet Take 1 tablet (0.125 mg total) by mouth daily. 30 tablet 0  . diphenhydrAMINE (BENADRYL) 25 MG tablet Take 25 mg by mouth at bedtime as needed.    . DULoxetine (CYMBALTA) 60 MG capsule Take 60 mg by mouth daily.  2  . empagliflozin (  JARDIANCE) 10 MG TABS tablet Take 10 mg by mouth daily. 30 tablet 0  . hydrALAZINE (APRESOLINE) 10 MG tablet TAKE 1 TABLET(10 MG) BY MOUTH THREE TIMES DAILY 270 tablet 0  . Insulin Glargine (BASAGLAR KWIKPEN) 100 UNIT/ML SOPN Inject 0.2 mLs (20 Units total) into the skin 2 (two) times daily. 45 mL 0  . Insulin Pen Needle (NOVOFINE) 30G X 8 MM MISC Inject 10 each into the skin as needed. 100 each 0  . isosorbide dinitrate (ISORDIL) 10 MG tablet Take 1 tablet (10 mg total) by mouth 3 (three) times daily. 90 tablet 0  . MAGNESIUM-OXIDE 400 (241.3 Mg) MG tablet Take 400 mg by mouth daily.  11  .  omeprazole (PRILOSEC) 20 MG capsule Take 20 mg by mouth daily.  1  . torsemide (DEMADEX) 20 MG tablet Take 1 tablet (20 mg total) by mouth daily. 30 tablet 11  . Vitamin D, Cholecalciferol, 1000 units CAPS Take 1,000 Units by mouth daily.  11  . LYRICA 225 MG capsule Take 225 mg by mouth daily.  1   No current facility-administered medications for this encounter.     Allergies  Allergen Reactions  . Codeine       Social History   Socioeconomic History  . Marital status: Divorced    Spouse name: Not on file  . Number of children: Not on file  . Years of education: Not on file  . Highest education level: Not on file  Occupational History  . Occupation: disability    Comment: stopped working in 2000 d/t occupational exposures  Social Needs  . Financial resource strain: Not on file  . Food insecurity:    Worry: Not on file    Inability: Not on file  . Transportation needs:    Medical: Not on file    Non-medical: Not on file  Tobacco Use  . Smoking status: Former Smoker    Types: Cigarettes  . Smokeless tobacco: Never Used  . Tobacco comment: "smoked when I drank"  Substance and Sexual Activity  . 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 Drug use: Never  . Sexual activity: Not Currently  Lifestyle  . Physical activity:    Days per week: Not on file    Minutes per session: Not on file  . Stress: Not on file  Relationships  . Social connections:    Talks on phone: Not on file    Gets together: Not on file    Attends religious service: Not on file    Active member of club or organization: Not on file    Attends meetings of clubs or organizations: Not on file    Relationship status: Not on file  . Intimate partner violence:    Fear of current or ex partner: Not on file    Emotionally abused: Not on file    Physically abused: Not on file    Forced sexual activity: Not on file  Other Topics Concern  . Not on  file  Social History Narrative   Patient lives at home with wife and some of his 10 children. He self-administers his own medications.      Family History  Problem Relation Age of Onset  . Hypertension Mother   . Diabetes Mother   . Hypertension Father   . Diabetes Father   . Diabetes Sister   . Diabetes Brother     Vitals:  11/30/17 1019  BP: 130/80  Pulse: 79  SpO2: 96%  Weight: 258 lb (117 kg)   Wt Readings from Last 3 Encounters:  11/30/17 258 lb (117 kg)  11/23/17 265 lb (120.2 kg)  11/12/17 258 lb 6.4 oz (117.2 kg)    PHYSICAL EXAM: General:  Appear chronically. No resp difficulty. Arrived in a wheel chair.  HEENT: normal Neck: supple. JVP 11-12. Carotids 2+ bilat; no bruits. No lymphadenopathy or thryomegaly appreciated. Cor: PMI nondisplaced. Regular rate & rhythm. No rubs, gallops or murmurs. Lungs: clear Abdomen: soft, nontender, distended. No hepatosplenomegaly. No bruits or masses. Good bowel sounds. Extremities: no cyanosis, clubbing, rash, R and LLE 1-2+ edema Neuro: alert & orientedx3, cranial nerves grossly intact. moves all 4 extremities w/o difficulty. Affect pleasant  ASSESSMENT & PLAN:  1. Chronic systolic CHF: Primarily nonischemic cardiomyopathy. Echo in 2017 with EF 15-20%. Medtronic ICD. Echo 10/2017 EF 15-20%, severely dilated RV with severely decreased systolic function. LHC/RHC 11/06/17 showed ongoing volume overload with 80% RCA stenosis.  Cardiac index low at 1.91. The degree of coronary disease does not explain his cardiomyopathy.    - NYHA class III. Volume status trending up but she has not been on torsemide, entresto, or inspra in 14 days. Marland Kitchen Restart torsemide 20 mg daily  - Restart entresto 97-103 twice a day - Hold off on eplerenone 25 daily. (gynecomastia with spiro) - Continue hydralazine 10 mg daily, imdur 10 mg daily  - Continue digoxin 0.125 mg daily - Hold off on BB with fatigue and recent low CI for now.    2. COPD: Never  smoked, but apparently had significant occupational exposure. It appears that he won a lawsuit dealing with the occupational exposure-related COPD.  - Continue 2 liters oxygen.    3. CAD: LHC 5/20: 80-90% proximal RCA stenosis with DES to RCA.  This did not cause his cardiomyopathy but is a large RCA. - Continue ASA, plavix - On DAPT for 6 months (through 05/05/18) - Continue atorvastatin  - Denies s/s ischemia  4. Polycythemia: Likely related to chronic hypoxia.  - Refuses sleep study  5. OSA - Refuses sleep study  6. DM - Continue jardiance - Encouraged him to make an appointment with PCP.   Continue Paramedicine. Hhe is high risk for readmit. BMET today and in 14 days.  Greater than 50% of the 25 minute visit was spent in counseling/coordination of care regarding disease state education, salt/fluid restriction, sliding scale diuretics, and medication compliance.   Tonye Becket, NP 11/30/17

## 2017-11-30 NOTE — Progress Notes (Signed)
ELECTROPHYSIOLOGY CONSULT NOTE  Patient ID: Richard [name], MRN: [id number], DOB/AGE: [birth year] 57 y.o. Admit date: (Not on file) Date of Consult: 11/30/2017  Primary Physician: Richard Pilgrim, MD Primary Cardiologist: Richard [name]     Richard [name] is a 57 y.o. male who is being seen today for the evaluation of Richard [name] at the request of Device followup.    HPI Richard [name] is a 57 y.o. male   Seen to establish ICD follow-up.  Previously implanted at [facility] for primary prevention in the context of primarily nonischemic heart disease.  He has significant biventricular failure and cath showed low cardiac output.  Currently he is supported with oxygen.  DATE TEST EF   *5**/19 Echo   15-20 %   5/19 LHC   LAD 80%/RCA 80->>Stent RV pressure 67  CI 1.9        Seen by Heart failure today    diuretics and Entresto resumed.   Past Medical History:  Diagnosis Date  . AICD (automatic cardioverter/defibrillator) present 01/11/2017  . Anxiety   . CKD (chronic kidney disease)   . COPD (chronic obstructive pulmonary disease) (HCC)    never smoker, industrial exposure  . Diabetes mellitus with diabetic neuropathy, with long-term current use of insulin (HCC)   . GERD (gastroesophageal reflux disease)   . High cholesterol   . History of gout    "take RX qd" (11/01/2017)  . Hypertension   . Nonobstructive atherosclerosis of coronary artery   . On home oxygen therapy    "2L prn" (11/01/2017)  . 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HCC)    LLL, present for years  . Systolic HF (heart failure) [facility]       Surgical History:  Past Surgical History:  Procedure Laterality Date  . CATARACT EXTRACTION W/ INTRAOCULAR LENS IMPLANTW/ TRABECULECTOMY Left ~ 2013   "may have done this when I had my other eye OR"  . CIRCUMCISION    . CORONARY STENT INTERVENTION N/A 11/02/2017   Procedure: CORONARY STENT INTERVENTION;  Surgeon: Richard Morale, MD;  Location: [facility] INVASIVE CV LAB;  Service: Cardiovascular;   Laterality: N/A;  . CORONARY STENT INTERVENTION N/A 11/02/2017   Procedure: CORONARY STENT INTERVENTION;  Surgeon: Richard Records, MD;  Location: [facility] INVASIVE CV LAB;  Service: Cardiovascular;  Laterality: N/A;  . EYE SURGERY Left ~ 2013   "fell on something in basement; stuck in my eye"  . ICD IMPLANT     Medtronic Dual Chamber 01/11/17  . MULTIPLE TOOTH EXTRACTIONS     "pulled all my top teeth"  . RIGHT/LEFT HEART CATH AND CORONARY ANGIOGRAPHY N/A 11/02/2017   Procedure: RIGHT/LEFT HEART CATH AND CORONARY ANGIOGRAPHY;  Surgeon: Richard Morale, MD;  Location: [facility] INVASIVE CV LAB;  Service: Cardiovascular;  Laterality: N/A;     Home Meds: Prior to Admission medications   Medication Sig Start Date End Date Taking? Authorizing Provider  aspirin EC 81 MG EC tablet Take 1 tablet (81 mg total) by mouth daily. 11/07/17  Yes Richard Courser, MD  atorvastatin (LIPITOR) 40 MG tablet Take 1 tablet (40 mg total) by mouth daily at 6 PM. 11/06/17  Yes Amin, Tilman Neat, MD  BREO ELLIPTA 100-25 MCG/INH AEPB Inhale 1 puff into the lungs daily. 10/28/17  Yes [provider]  clopidogrel (PLAVIX) 75 MG tablet Take 1 tablet (75 mg total) by mouth daily with breakfast. 11/07/17  Yes Richard Courser, MD  digoxin (LANOXIN) 0.125 MG tablet Take 1 tablet (0.125 mg total)  by mouth daily. 11/07/17  Yes Richard Courser, MD  diphenhydrAMINE (BENADRYL) 25 MG tablet Take 25 mg by mouth at bedtime as needed.   Yes [provider]  DULoxetine (CYMBALTA) 60 MG capsule Take 60 mg by mouth daily. 09/28/17  Yes [provider]  empagliflozin (JARDIANCE) 10 MG TABS tablet Take 10 mg by mouth daily. 11/06/17  Yes Richard Courser, MD  hydrALAZINE (APRESOLINE) 10 MG tablet TAKE 1 TABLET(10 MG) BY MOUTH THREE TIMES DAILY 11/06/17  Yes Richard Courser, MD  Insulin Pen Needle (NOVOFINE) 30G X 8 MM MISC Inject 10 each into the skin as needed. 11/06/17  Yes Richard Courser, MD  insulin regular human CONCENTRATED (HUMULIN R U-500  KWIKPEN) 500 UNIT/ML kwikpen USE AS DIRECTED PER SLIDING SCALE. MAX DAILY DOSE IS 400 UNITS. 07/18/17  Yes [provider]  isosorbide dinitrate (ISORDIL) 10 MG tablet Take 1 tablet (10 mg total) by mouth 3 (three) times daily. 11/06/17  Yes Richard Courser, MD  LYRICA 225 MG capsule Take 225 mg by mouth daily. 10/01/17  Yes [provider]  MAGNESIUM-OXIDE 400 (241.3 Mg) MG tablet Take 400 mg by mouth daily. 10/28/17  Yes [provider]  omeprazole (PRILOSEC) 20 MG capsule Take 20 mg by mouth daily. 10/04/17  Yes [provider]  sacubitril-valsartan (ENTRESTO) 97-103 MG Take 1 tablet by mouth 2 (two) times daily. 11/30/17  Yes Clegg, Amy D, NP  torsemide (DEMADEX) 20 MG tablet Take 1 tablet (20 mg total) by mouth daily. 11/30/17  Yes Clegg, Amy D, NP  Vitamin [name], Cholecalciferol, 1000 units CAPS Take 1,000 Units by mouth daily. 10/17/17  Yes [provider]    Allergies:  Allergies  Allergen Reactions  . Codeine   . Coconut Oil Itching    Social History   Socioeconomic History  . Marital status: Divorced    Spouse name: Not on file  . Number of children: Not on file  . Years of education: Not on file  . Highest education level: Not on file  Occupational History  . Occupation: disability    Comment: stopped working in 2000 [name]/t occupational exposures  Social Needs  . Financial resource strain: Not on file  . Food insecurity:    Worry: Not on file    Inability: Not on file  . Transportation needs:    Medical: Not on file    Non-medical: Not on file  Tobacco Use  . Smoking status: Former Smoker    Types: Cigarettes  . Smokeless tobacco: Never Used  . Tobacco comment: "smoked when I drank"  Substance and Sexual Activity  . 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 Drug use: Never  . Sexual activity: Not Currently  Lifestyle  . Physical activity:    Days per week: Not on file    Minutes  per session: Not on file  . Stress: Not on file  Relationships  . Social connections:    Talks on phone: Not on file    Gets together: Not on file    Attends religious service: Not on file    Active member of club or organization: Not on file    Attends meetings of clubs or organizations: Not on file    Relationship status: Not on file  . Intimate partner violence:    Fear of current or ex partner: Not on file    Emotionally abused: Not on file  Physically abused: Not on file    Forced sexual activity: Not on file  Other Topics Concern  . Not on file  Social History Narrative   Patient lives at home with wife and some of his 10 children. He self-administers his own medications.     Family History  Problem Relation Age of Onset  . Hypertension Mother   . Diabetes Mother   . Hypertension Father   . Diabetes Father   . Diabetes Sister   . Diabetes Brother      ROS:  Please see the history of present illness.    All other systems reviewed and negative.    Physical Exam: Blood pressure (!) 136/92, pulse (!) 104, height 6\' 6"  (1.981 m), weight 258 lb (117 kg), SpO2 94 %. General: Well developed, well nourished male in no acute distress. Head: Normocephalic, atraumatic, sclera non-icteric, no xanthomas, nares are without discharge. EENT: normal  Lymph Nodes:  none Neck: Negative for carotid bruits. JVD not elevated. Back:without scoliosis kyphosis Lungs: Clear bilaterally to auscultation without wheezes, rales, or rhonchi. Breathing is unlabored. Heart: RRR with S1 S2. No  murmur . No rubs, or gallops appreciated. Abdomen: Soft, non-tender, non-distended with normoactive bowel sounds. No hepatomegaly. No rebound/guarding. No obvious abdominal masses. Msk:  Strength and tone appear normal for age. Extremities: No clubbing or cyanosis. 2+ edema.  Distal pedal pulses are 2+ and equal bilaterally. Skin: Warm and Dry Neuro: Alert and oriented X 3. CN III-XII intact Grossly  normal sensory and motor function . Psych:  Responds to questions appropriately with a normal affect.      Labs: Cardiac Enzymes No results for input(s): CKTOTAL, CKMB, TROPONINI in the last 72 hours. CBC Lab Results  Component Value Date   WBC 5.0 11/06/2017   HGB 16.7 11/06/2017   HCT 51.6 11/06/2017   MCV 100.0 11/06/2017   PLT 253 11/06/2017   PROTIME: No results for input(s): LABPROT, INR in the last 72 hours. Chemistry  Recent Labs  Lab 11/30/17 1053  NA 143  K 3.9  CL 99*  CO2 33*  BUN 14  CREATININE 1.10  CALCIUM 9.3  GLUCOSE 162*   Lipids Lab Results  Component Value Date   CHOL 122 11/03/2017   HDL 35 (L) 11/03/2017   LDLCALC 69 11/03/2017   TRIG 90 11/03/2017   BNP No results found for: PROBNP Thyroid Function Tests: No results for input(s): TSH, T4TOTAL, T3FREE, THYROIDAB in the last 72 hours.  Invalid input(s): FREET3 Miscellaneous No results found for: DDIMER  Radiology/Studies:  No results found.  EKG: Reviewed from 11/03/2017.  Narrow QRS   Assessment and Plan:  Cardiomyopathy ischemic and nonischemic  Congestive heart failure-chronic-systolic  Pulmonary hypertension on oxygen therapy  Sinus tachycardia   Device function is normal.  We will review with Dr. DM the role of ivabradine  We will also initiate an ICM clinic    Sherryl Manges

## 2017-11-30 NOTE — Patient Instructions (Signed)
Medication Instructions:  Your physician recommends that you continue on your current medications as directed. Please refer to the Current Medication list given to you today.  Labwork: None ordered.  Testing/Procedures: None ordered.  Follow-Up: Your physician wants you to follow-up in: One Year with Dr Graciela Husbands. You will receive a reminder letter in the mail two months in advance. If you don't receive a letter, please call our office to schedule the follow-up appointment.  Remote monitoring is used to monitor your ICD from home. This monitoring reduces the number of office visits required to check your device to one time per year. It allows [place] to keep an eye on the functioning of your device to ensure it is working properly. You are scheduled for a device check from home on 03/04/2018. You may send your transmission at any time that day. If you have a wireless device, the transmission will be sent automatically. After your physician reviews your transmission, you will receive a postcard with your next transmission date.    Any Other Special Instructions Will Be Listed Below (If Applicable).     If you need a refill on your cardiac medications before your next appointment, please call your pharmacy.

## 2017-11-30 NOTE — Progress Notes (Signed)
Paramedicine Encounter   Patient ID: Richard [name] , male,   DOB: Aug 05, 1960,56 y.o.,  MRN: [id number]   [name] was seen at the HF clinic with Tonye Becket, NP. He reports exertional dyspnea and said he becomes SOB when he gets aggravated. He exhibited lower extremity edema and abdominal distention. Per Amy, he is to restart entresto and torsemide and will have a follow up in two weeks.   Time spent with patient: 35 minutes  [name], EMT 11/30/2017   ACTION: Next visit planned for 1 week

## 2017-12-04 ENCOUNTER — Telehealth (HOSPITAL_COMMUNITY): Payer: Self-pay

## 2017-12-05 ENCOUNTER — Other Ambulatory Visit: Payer: Self-pay | Admitting: Internal Medicine

## 2017-12-05 NOTE — Telephone Encounter (Signed)
I called Richard [name] to schedule an appointment. He stated he would be available to meet on Thursday at 09:30.

## 2017-12-06 ENCOUNTER — Other Ambulatory Visit (HOSPITAL_COMMUNITY): Payer: Self-pay

## 2017-12-06 ENCOUNTER — Other Ambulatory Visit (HOSPITAL_COMMUNITY): Payer: Self-pay | Admitting: Cardiology

## 2017-12-06 ENCOUNTER — Other Ambulatory Visit: Payer: Self-pay | Admitting: Internal Medicine

## 2017-12-06 MED ORDER — ATORVASTATIN CALCIUM 40 MG PO TABS: 40.0000 mg | ORAL_TABLET | Freq: Every day | ORAL | 3 refills | Status: DC

## 2017-12-06 MED ORDER — CLOPIDOGREL BISULFATE 75 MG PO TABS: 75.0000 mg | ORAL_TABLET | Freq: Every day | ORAL | 3 refills | Status: DC

## 2017-12-06 MED ORDER — ISOSORBIDE DINITRATE 10 MG PO TABS: 10.0000 mg | ORAL_TABLET | Freq: Three times a day (TID) | ORAL | 3 refills | Status: DC

## 2017-12-06 MED ORDER — DIGOXIN 125 MCG PO TABS: 0.1250 mg | ORAL_TABLET | Freq: Every day | ORAL | 3 refills | Status: DC

## 2017-12-06 NOTE — Progress Notes (Signed)
Paramedicine Encounter    Patient ID: Richard [name], male    DOB: 06/13/1961, 57 y.o.   MRN: [id number]   Patient Care Team: Ananias Pilgrim, MD as PCP - General (Family Medicine)  Patient Active Problem List   Diagnosis Date Noted  . S/P right and left heart catheterization 11/02/2017  . Panic attacks 11/02/2017  . Non-STEMI (non-ST elevated myocardial infarction) (HCC)   . 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 Acute on chronic combined systolic and diastolic CHF (congestive heart failure) (HCC)   . COPD (chronic obstructive pulmonary disease) (HCC)   . Hypertension   . CKD (chronic kidney disease)   . 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HCC)   . ICD (implantable cardioverter-defibrillator) in place   . GERD (gastroesophageal reflux disease)   . Diabetes mellitus with diabetic neuropathy, with long-term current use of insulin (HCC)     Current Outpatient Medications:  .  aspirin EC 81 MG EC tablet, Take 1 tablet (81 mg total) by mouth daily., Disp: 90 tablet, Rfl: 0 .  atorvastatin (LIPITOR) 40 MG tablet, Take 1 tablet (40 mg total) by mouth daily at 6 PM., Disp: 30 tablet, Rfl: 0 .  BREO ELLIPTA 100-25 MCG/INH AEPB, Inhale 1 puff into the lungs daily., Disp: , Rfl: 5 .  clopidogrel (PLAVIX) 75 MG tablet, Take 1 tablet (75 mg total) by mouth daily with breakfast., Disp: 30 tablet, Rfl: 0 .  digoxin (LANOXIN) 0.125 MG tablet, Take 1 tablet (0.125 mg total) by mouth daily., Disp: 30 tablet, Rfl: 0 .  diphenhydrAMINE (BENADRYL) 25 MG tablet, Take 25 mg by mouth at bedtime as needed., Disp: , Rfl:  .  DULoxetine (CYMBALTA) 60 MG capsule, Take 60 mg by mouth daily., Disp: , Rfl: 2 .  empagliflozin (JARDIANCE) 10 MG TABS tablet, Take 10 mg by mouth daily., Disp: 30 tablet, Rfl: 0 .  hydrALAZINE (APRESOLINE) 10 MG tablet, TAKE 1 TABLET(10 MG) BY MOUTH THREE TIMES DAILY, Disp: 270 tablet, Rfl: 0 .  insulin regular human CONCENTRATED  (HUMULIN R U-500 KWIKPEN) 500 UNIT/ML kwikpen, USE AS DIRECTED PER SLIDING SCALE. MAX DAILY DOSE IS 400 UNITS., Disp: , Rfl:  .  isosorbide dinitrate (ISORDIL) 10 MG tablet, Take 1 tablet (10 mg total) by mouth 3 (three) times daily., Disp: 90 tablet, Rfl: 0 .  MAGNESIUM-OXIDE 400 (241.3 Mg) MG tablet, Take 400 mg by mouth daily., Disp: , Rfl: 11 .  omeprazole (PRILOSEC) 20 MG capsule, Take 20 mg by mouth daily., Disp: , Rfl: 1 .  sacubitril-valsartan (ENTRESTO) 97-103 MG, Take 1 tablet by mouth 2 (two) times daily., Disp: 60 tablet, Rfl: 11 .  torsemide (DEMADEX) 20 MG tablet, Take 1 tablet (20 mg total) by mouth daily., Disp: 30 tablet, Rfl: 11 .  Vitamin D, Cholecalciferol, 1000 units CAPS, Take 1,000 Units by mouth daily., Disp: , Rfl: 11 .  Insulin Pen Needle (NOVOFINE) 30G X 8 MM MISC, Inject 10 each into the skin as needed. (Patient not taking: Reported on 12/06/2017), Disp: 100 each, Rfl: 0 .  LYRICA 225 MG capsule, Take 225 mg by mouth daily., Disp: , Rfl: 1 Allergies  Allergen Reactions  . Codeine   . Coconut Oil Itching      Social History   Socioeconomic History  . Marital status: Divorced    Spouse name: Not on file  . Number of children: Not on file  . Years of education: Not on file  .  Highest education level: Not on file  Occupational History  . Occupation: disability    Comment: stopped working in 2000 d/t occupational exposures  Social Needs  . Financial resource strain: Not on file  . Food insecurity:    Worry: Not on file    Inability: Not on file  . Transportation needs:    Medical: Not on file    Non-medical: Not on file  Tobacco Use  . Smoking status: Former Smoker    Types: Cigarettes  . Smokeless tobacco: Never Used  . Tobacco comment: "smoked when I drank"  Substance and Sexual Activity  . 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 Drug use: Never  . Sexual activity: Not Currently  Lifestyle   . Physical activity:    Days per week: Not on file    Minutes per session: Not on file  . Stress: Not on file  Relationships  . Social connections:    Talks on phone: Not on file    Gets together: Not on file    Attends religious service: Not on file    Active member of club or organization: Not on file    Attends meetings of clubs or organizations: Not on file    Relationship status: Not on file  . Intimate partner violence:    Fear of current or ex partner: Not on file    Emotionally abused: Not on file    Physically abused: Not on file    Forced sexual activity: Not on file  Other Topics Concern  . Not on file  Social History Narrative   Patient lives at home with wife and some of his 10 children. He self-administers his own medications.    Physical Exam  Constitutional: He is oriented to person, place, and time.  Neck: No JVD present.  Cardiovascular: Normal rate.  Pulmonary/Chest: Effort normal and breath sounds normal.  Abdominal: He exhibits distension.  Musculoskeletal: Normal range of motion. He exhibits edema.  Neurological: He is alert and oriented to person, place, and time.  Skin: Skin is warm and dry.  Psychiatric: He has a normal mood and affect.        Future Appointments  Date Time Provider Department Center  12/14/2017 12:00 PM [facility] PA/NP [facility] None  03/04/2018  7:15 AM [facility] DEVICE REMOTES [facility]    BP 108/70 (BP Location: Left Arm, Patient Position: Sitting, Cuff Size: Large)   Pulse 75   Resp 16   Wt 251 lb 3.2 oz (113.9 kg)   SpO2 93%   BMI 29.03 kg/m   Weight yesterday- 252.4 lb Last visit weight- 258 lb  [name] was seen at home today and reported feeling generally well. He denied SOB, headache, dizziness or orthopnea. He stated he has been compliant with his medications but when we went over them it seemed he has been taking entresto incorrectly. He was supposed to be doubling up on his 49/51 tablets until  they are gone but was not doing that. We made the adjustment and he understands moving forward how he should be taking this medication. His medications were verified and I supervised him while he filled his own pillbox. He ran out of five medications while we were filling his pillbox and all were ordered and will be available for pickup within the next 24 hours. He advised that he would be able to get them and put them in his box  as needed.   [name], EMT 12/06/17  ACTION: Home visit completed Next visit planned for 1 week

## 2017-12-12 ENCOUNTER — Other Ambulatory Visit: Payer: Self-pay | Admitting: Internal Medicine

## 2017-12-12 ENCOUNTER — Other Ambulatory Visit (HOSPITAL_COMMUNITY): Payer: Self-pay

## 2017-12-12 NOTE — Progress Notes (Signed)
Paramedicine Encounter    Patient ID: Richard [name], male    DOB: 11/05/1960, 57 y.o.   MRN: [id number]   Patient Care Team: Ananias Pilgrim, MD as PCP - General (Family Medicine)  Patient Active Problem List   Diagnosis Date Noted  . S/P right and left heart catheterization 11/02/2017  . Panic attacks 11/02/2017  . Non-STEMI (non-ST elevated myocardial infarction) (HCC)   . 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 Acute on chronic combined systolic and diastolic CHF (congestive heart failure) (HCC)   . COPD (chronic obstructive pulmonary disease) (HCC)   . Hypertension   . CKD (chronic kidney disease)   . 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HCC)   . ICD (implantable cardioverter-defibrillator) in place   . GERD (gastroesophageal reflux disease)   . Diabetes mellitus with diabetic neuropathy, with long-term current use of insulin (HCC)     Current Outpatient Medications:  .  aspirin EC 81 MG EC tablet, Take 1 tablet (81 mg total) by mouth daily., Disp: 90 tablet, Rfl: 0 .  atorvastatin (LIPITOR) 40 MG tablet, Take 1 tablet (40 mg total) by mouth daily at 6 PM., Disp: 30 tablet, Rfl: 3 .  BREO ELLIPTA 100-25 MCG/INH AEPB, Inhale 1 puff into the lungs daily., Disp: , Rfl: 5 .  clopidogrel (PLAVIX) 75 MG tablet, Take 1 tablet (75 mg total) by mouth daily with breakfast., Disp: 30 tablet, Rfl: 3 .  digoxin (LANOXIN) 0.125 MG tablet, Take 1 tablet (0.125 mg total) by mouth daily., Disp: 30 tablet, Rfl: 3 .  diphenhydrAMINE (BENADRYL) 25 MG tablet, Take 25 mg by mouth at bedtime as needed., Disp: , Rfl:  .  DULoxetine (CYMBALTA) 60 MG capsule, Take 60 mg by mouth daily., Disp: , Rfl: 2 .  hydrALAZINE (APRESOLINE) 10 MG tablet, TAKE 1 TABLET(10 MG) BY MOUTH THREE TIMES DAILY, Disp: 270 tablet, Rfl: 0 .  insulin regular human CONCENTRATED (HUMULIN R U-500 KWIKPEN) 500 UNIT/ML kwikpen, USE AS DIRECTED PER SLIDING SCALE. MAX DAILY DOSE IS 400  UNITS., Disp: , Rfl:  .  isosorbide dinitrate (ISORDIL) 10 MG tablet, Take 1 tablet (10 mg total) by mouth 3 (three) times daily., Disp: 90 tablet, Rfl: 3 .  MAGNESIUM-OXIDE 400 (241.3 Mg) MG tablet, Take 400 mg by mouth daily., Disp: , Rfl: 11 .  omeprazole (PRILOSEC) 20 MG capsule, Take 20 mg by mouth daily., Disp: , Rfl: 1 .  sacubitril-valsartan (ENTRESTO) 97-103 MG, Take 1 tablet by mouth 2 (two) times daily., Disp: 60 tablet, Rfl: 11 .  torsemide (DEMADEX) 20 MG tablet, Take 1 tablet (20 mg total) by mouth daily., Disp: 30 tablet, Rfl: 11 .  Vitamin D, Cholecalciferol, 1000 units CAPS, Take 1,000 Units by mouth daily., Disp: , Rfl: 11 .  empagliflozin (JARDIANCE) 10 MG TABS tablet, Take 10 mg by mouth daily. (Patient not taking: Reported on 12/12/2017), Disp: 30 tablet, Rfl: 0 .  Insulin Pen Needle (NOVOFINE) 30G X 8 MM MISC, Inject 10 each into the skin as needed. (Patient not taking: Reported on 12/06/2017), Disp: 100 each, Rfl: 0 .  LYRICA 225 MG capsule, Take 225 mg by mouth daily., Disp: , Rfl: 1 Allergies  Allergen Reactions  . Codeine   . Coconut Oil Itching      Social History   Socioeconomic History  . Marital status: Divorced    Spouse name: Not on file  . Number of children: Not on file  . Years of  education: Not on file  . Highest education level: Not on file  Occupational History  . Occupation: disability    Comment: stopped working in 2000 d/t occupational exposures  Social Needs  . Financial resource strain: Not on file  . Food insecurity:    Worry: Not on file    Inability: Not on file  . Transportation needs:    Medical: Not on file    Non-medical: Not on file  Tobacco Use  . Smoking status: Former Smoker    Types: Cigarettes  . Smokeless tobacco: Never Used  . Tobacco comment: "smoked when I drank"  Substance and Sexual Activity  . 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 Drug use: Never  .  Sexual activity: Not Currently  Lifestyle  . Physical activity:    Days per week: Not on file    Minutes per session: Not on file  . Stress: Not on file  Relationships  . Social connections:    Talks on phone: Not on file    Gets together: Not on file    Attends religious service: Not on file    Active member of club or organization: Not on file    Attends meetings of clubs or organizations: Not on file    Relationship status: Not on file  . Intimate partner violence:    Fear of current or ex partner: Not on file    Emotionally abused: Not on file    Physically abused: Not on file    Forced sexual activity: Not on file  Other Topics Concern  . Not on file  Social History Narrative   Patient lives at home with wife and some of his 10 children. He self-administers his own medications.    Physical Exam  Cardiovascular: Regular rhythm.  Pulmonary/Chest: Effort normal and breath sounds normal.  Abdominal: Soft. He exhibits distension.  Musculoskeletal: Normal range of motion. He exhibits edema.  Skin: Skin is warm and dry.  Psychiatric: He has a normal mood and affect.        Future Appointments  Date Time Provider Department Center  12/14/2017 12:00 PM [facility] PA/NP [facility] None  03/04/2018  7:15 AM [facility] DEVICE REMOTES [facility]    BP 100/64 (BP Location: Left Arm, Patient Position: Sitting, Cuff Size: Large)   Pulse (!) 50   Resp 16   Wt 253 lb (114.8 kg)   SpO2 97%   BMI 29.24 kg/m   Weight yesterday- 253 lb Last visit weight- 251 lb  Richard [name] was seen at home today and reported feeling generally well. He denied SOB, dizziness, headaches or orthopnea. He stated he has been taking his medications and had not missed any doses since last week. Upon obtaining his vital signs I noted that his radial pulse was slow. I placed him on the cardiac monitor and he was found to be in a sinus rhythm at roughly 80. I attempted to confirm a mechanical pulse  matching the monitor however I was only able to feel a pulse about every other complex present on the monitor screen. I auscultated his cardiac sounds and palpated a radial pulse and while his heart sounds matched the monitor, his radial pulse remained in the 40's. He confirmed that he was feeling well so I contacted Otilio Saber, PA-C at the HF clinic. He advised to give an extra torsemide today and tomorrow for his abdominal distension and BLEE but  did not have advice concerning his radial rate. Richard Forstner was understanding and agreeable.   [name], EMT 12/12/17  ACTION: Home visit completed Next visit planned for 1 week

## 2017-12-13 NOTE — Progress Notes (Signed)
Advanced Heart Failure Clinic Note   PCP: Ananias Pilgrim, MD PCP-Cardiologist: Dr Shirlee Latch  HPI: Richard [name] is a 57 y.o. male with a history of chronic systolic HF s/p Medtronic ICD (12/2016), HTN, DM, and HLD.   Admitted 5/8-5/14/19 with A/C systolic HF. Echo showed EF 15-20%. Advanced HF team was consulted. He diuresed 20 lbs with IV lasix, then transitioned to torsemide 40 mg daily. Underwent R/LHC. Results below. HF meds were optimized. Betablocker was not started due to low CI and soft BPs. He was discharged on home oxygen. Referred to cardiac rehab. DC weight: 260 lbs.   Today he returns for HF follow up. Last visit multiple medications restarted. Earlier this week he increased torsemide to 40 mg daily. Overall feeling better. SOB with exertion. Using 2 liters oxygen. Denies PND/Orthopnea. Appetite ok. No fever or chills. Weight at home 253-255 pounds. Eating watermelon and drinking extra fluid. Taking all medications. Followed by Paramedicine.   Echo 11/01/17: EF 15-20%, grade 2 DD, mild MR, RV severely reduced, LA moderately dilated, RA mildly dilated, midl TR,   LHC/RHC (5/10):  Coronary Findings  Diagnostic  Dominance: Right  Left Main  No significant disease.  Left Anterior Descending  80% distal LAD stenosis.  Ramus Intermedius  Moderate ramus with luminal irregularities.  Left Circumflex  Luminal irregularities.  Right Coronary Artery  Focal 80% stenosis proximal RCA.  Intervention  Successful direct stent PCI on the proximal RCA with 85% stenosis and TIMI grade III flow reduced to 0% with TIMI Grade 3 Flow. Right Heart   Right Heart Pressures RHC Procedural Findings: Hemodynamics (mmHg)  RA 13 RV 67/18 PA 69/31, mean 47 PCWP mean 24 LV 128/33 AO 117/85  Oxygen saturations: PA 66% AO 98%  Cardiac Output (Fick) 4.8  Cardiac Index (Fick) 1.91 PVR 4.8 WU   Review of systems complete and found to be negative unless listed in HPI.   Past Medical History:    Diagnosis Date  . AICD (automatic cardioverter/defibrillator) present 01/11/2017  . Anxiety   . CKD (chronic kidney disease)   . COPD (chronic obstructive pulmonary disease) (HCC)    never smoker, industrial exposure  . Diabetes mellitus with diabetic neuropathy, with long-term current use of insulin (HCC)   . GERD (gastroesophageal reflux disease)   . High cholesterol   . History of gout    "take RX qd" (11/01/2017)  . Hypertension   . Nonobstructive atherosclerosis of coronary artery   . On home oxygen therapy    "2L prn" (11/01/2017)  . 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HCC)    LLL, present for years  . Systolic HF (heart failure) (HCC)     Current Outpatient Medications  Medication Sig Dispense Refill  . aspirin EC 81 MG EC tablet Take 1 tablet (81 mg total) by mouth daily. 90 tablet 0  . atorvastatin (LIPITOR) 40 MG tablet Take 1 tablet (40 mg total) by mouth daily at 6 PM. 30 tablet 3  . BREO ELLIPTA 100-25 MCG/INH AEPB Inhale 1 puff into the lungs daily.  5  . clopidogrel (PLAVIX) 75 MG tablet Take 1 tablet (75 mg total) by mouth daily with breakfast. 30 tablet 3  . digoxin (LANOXIN) 0.125 MG tablet Take 1 tablet (0.125 mg total) by mouth daily. 30 tablet 3  . diphenhydrAMINE (BENADRYL) 25 MG tablet Take 25 mg by mouth at bedtime as needed.    . DULoxetine (CYMBALTA) 60 MG capsule Take 60 mg by mouth daily.  2  . hydrALAZINE (APRESOLINE)  10 MG tablet TAKE 1 TABLET(10 MG) BY MOUTH THREE TIMES DAILY 270 tablet 0  . Insulin Pen Needle (NOVOFINE) 30G X 8 MM MISC Inject 10 each into the skin as needed. 100 each 0  . insulin regular human CONCENTRATED (HUMULIN R U-500 KWIKPEN) 500 UNIT/ML kwikpen USE AS DIRECTED PER SLIDING SCALE. MAX DAILY DOSE IS 400 UNITS.    Marland Kitchen isosorbide dinitrate (ISORDIL) 10 MG tablet Take 1 tablet (10 mg total) by mouth 3 (three) times daily. 90 tablet 3  . MAGNESIUM-OXIDE 400 (241.3 Mg) MG tablet Take 400 mg by mouth daily.  11  . omeprazole (PRILOSEC) 20 MG capsule  Take 20 mg by mouth daily.  1  . sacubitril-valsartan (ENTRESTO) 97-103 MG Take 1 tablet by mouth 2 (two) times daily. 60 tablet 11  . torsemide (DEMADEX) 20 MG tablet Take 1 tablet (20 mg total) by mouth daily. 30 tablet 11  . Vitamin D, Cholecalciferol, 1000 units CAPS Take 1,000 Units by mouth daily.  11   No current facility-administered medications for this encounter.     Allergies  Allergen Reactions  . Codeine   . Coconut Oil Itching      Social History   Socioeconomic History  . Marital status: Divorced    Spouse name: Not on file  . Number of children: Not on file  . Years of education: Not on file  . Highest education level: Not on file  Occupational History  . Occupation: disability    Comment: stopped working in 2000 d/t occupational exposures  Social Needs  . Financial resource strain: Not on file  . Food insecurity:    Worry: Not on file    Inability: Not on file  . Transportation needs:    Medical: Not on file    Non-medical: Not on file  Tobacco Use  . Smoking status: Former Smoker    Types: Cigarettes  . Smokeless tobacco: Never Used  . Tobacco comment: "smoked when I drank"  Substance and Sexual Activity  . 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 Drug use: Never  . Sexual activity: Not Currently  Lifestyle  . Physical activity:    Days per week: Not on file    Minutes per session: Not on file  . Stress: Not on file  Relationships  . Social connections:    Talks on phone: Not on file    Gets together: Not on file    Attends religious service: Not on file    Active member of club or organization: Not on file    Attends meetings of clubs or organizations: Not on file    Relationship status: Not on file  . Intimate partner violence:    Fear of current or ex partner: Not on file    Emotionally abused: Not on file    Physically abused: Not on file    Forced sexual activity: Not on file    Other Topics Concern  . Not on file  Social History Narrative   Patient lives at home with wife and some of his 10 children. He self-administers his own medications.      Family History  Problem Relation Age of Onset  . Hypertension Mother   . Diabetes Mother   . Hypertension Father   . Diabetes Father   . Diabetes Sister   . Diabetes Brother     Vitals:   12/14/17 1216  BP: 124/86  Pulse: 90  SpO2: 95%  Weight: 254 lb (115.2 kg)   Wt Readings from Last 3 Encounters:  12/14/17 254 lb (115.2 kg)  12/12/17 253 lb (114.8 kg)  12/06/17 251 lb 3.2 oz (113.9 kg)    PHYSICAL EXAM: General:  Well appearing. No resp difficulty. Walked in the clinic with his wife and daughter.  HEENT: normal Neck: supple. JVP ~10 . Carotids 2+ bilat; no bruits. No lymphadenopathy or thryomegaly appreciated. Cor: PMI nondisplaced. Regular rate & rhythm. No rubs, gallops or murmurs. Lungs: clear Abdomen: soft, nontender, distended. No hepatosplenomegaly. No bruits or masses. Good bowel sounds. Extremities: no cyanosis, clubbing, rash, edema Neuro: alert & orientedx3, cranial nerves grossly intact. moves all 4 extremities w/o difficulty. Affect pleasant  ASSESSMENT & PLAN:  1. Chronic systolic CHF: Primarily nonischemic cardiomyopathy. Echo in 2017 with EF 15-20%. Medtronic ICD. Echo 10/2017 EF 15-20%, severely dilated RV with severely decreased systolic function. LHC/RHC 11/06/17 showed ongoing volume overload with 80% RCA stenosis.  Cardiac index low at 1.91. The degree of coronary disease does not explain his cardiomyopathy. Medtronic Optivol: Fluid elevated. Activity ~2-3 hours  No VT.     - NYHA class III.  - Increase torsmide 60 mg daily. Would like his weight to be < 250 pounds.  -Continue entresto 97-103 twice a day - Hold off on eplerenone 25 daily. (gynecomastia with spiro) - Continue hydralazine 10 mg daily, imdur 10 mg daily  - Continue digoxin 0.125 mg daily - Hold off on BB with  fatigue and recent low CI for now.   2. COPD: Never smoked, but apparently had significant occupational exposure. It appears that he won a lawsuit dealing with the occupational exposure-related COPD.  - Continue 2 liters oxygen.    3. CAD: LHC 5/20: 80-90% proximal RCA stenosis with DES to RCA.  This did not cause his cardiomyopathy but is a large RCA. - Continue ASA, plavix - On DAPT for 6 months (through 05/05/18) - Continue atorvastatin  - Denies S/S ischemia.   4. Polycythemia: Likely related to chronic hypoxia.  - Refuses sleep study  5. OSA - Refuses sleep study  6. DM - Continue jardiance - Encouraged him to make an appointment with PCP.   Check BMET/BNP today. Follow up 4 weeks.  Greater than 50% of the 25 minute visit was spent in counseling/coordination of care regarding disease state education, salt/fluid restriction, sliding scale diuretics, and medication compliance.  Continue Paramedicine.  Tonye Becket, NP 12/14/17

## 2017-12-14 ENCOUNTER — Ambulatory Visit (HOSPITAL_COMMUNITY)
Admission: RE | Admit: 2017-12-14 | Discharge: 2017-12-14 | Disposition: A | Discharge status: Home / Self Care | Payer: Medicare Other | Source: Ambulatory Visit | Attending: Cardiology | Admitting: Cardiology

## 2017-12-14 VITALS — BP 124/86 | HR 90 | Wt 254.0 lb

## 2017-12-14 DIAGNOSIS — I5022 Chronic systolic (congestive) heart failure: Secondary | ICD-10-CM | POA: Insufficient documentation

## 2017-12-14 DIAGNOSIS — Z7902 Long term (current) use of antithrombotics/antiplatelets: Secondary | ICD-10-CM | POA: Diagnosis not present

## 2017-12-14 DIAGNOSIS — Z95810 Presence of automatic (implantable) cardiac defibrillator: Secondary | ICD-10-CM | POA: Diagnosis not present

## 2017-12-14 DIAGNOSIS — E7800 Pure hypercholesterolemia, unspecified: Secondary | ICD-10-CM | POA: Insufficient documentation

## 2017-12-14 DIAGNOSIS — N189 Chronic kidney disease, unspecified: Secondary | ICD-10-CM | POA: Insufficient documentation

## 2017-12-14 DIAGNOSIS — Z79899 Other long term (current) drug therapy: Secondary | ICD-10-CM | POA: Diagnosis not present

## 2017-12-14 DIAGNOSIS — K219 Gastro-esophageal reflux disease without esophagitis: Secondary | ICD-10-CM | POA: Insufficient documentation

## 2017-12-14 DIAGNOSIS — D751 Secondary polycythemia: Secondary | ICD-10-CM | POA: Diagnosis not present

## 2017-12-14 DIAGNOSIS — Z87891 Personal history of nicotine dependence: Secondary | ICD-10-CM | POA: Insufficient documentation

## 2017-12-14 DIAGNOSIS — J449 Chronic obstructive pulmonary disease, unspecified: Secondary | ICD-10-CM

## 2017-12-14 DIAGNOSIS — E1140 Type 2 diabetes mellitus with diabetic neuropathy, unspecified: Secondary | ICD-10-CM | POA: Diagnosis not present

## 2017-12-14 DIAGNOSIS — G4733 Obstructive sleep apnea (adult) (pediatric): Secondary | ICD-10-CM | POA: Insufficient documentation

## 2017-12-14 DIAGNOSIS — F419 Anxiety disorder, unspecified: Secondary | ICD-10-CM | POA: Insufficient documentation

## 2017-12-14 DIAGNOSIS — I2510 Atherosclerotic heart disease of native coronary artery without angina pectoris: Secondary | ICD-10-CM | POA: Insufficient documentation

## 2017-12-14 DIAGNOSIS — Z8249 Family history of ischemic heart disease and other diseases of the circulatory system: Secondary | ICD-10-CM | POA: Insufficient documentation

## 2017-12-14 DIAGNOSIS — Z9981 Dependence on supplemental oxygen: Secondary | ICD-10-CM | POA: Insufficient documentation

## 2017-12-14 DIAGNOSIS — E1122 Type 2 diabetes mellitus with diabetic chronic kidney disease: Secondary | ICD-10-CM | POA: Insufficient documentation

## 2017-12-14 DIAGNOSIS — Z7982 Long term (current) use of aspirin: Secondary | ICD-10-CM | POA: Diagnosis not present

## 2017-12-14 DIAGNOSIS — Z794 Long term (current) use of insulin: Secondary | ICD-10-CM | POA: Insufficient documentation

## 2017-12-14 DIAGNOSIS — I130 Hypertensive heart and chronic kidney disease with heart failure and stage 1 through stage 4 chronic kidney disease, or unspecified chronic kidney disease: Secondary | ICD-10-CM | POA: Diagnosis not present

## 2017-12-14 DIAGNOSIS — I428 Other cardiomyopathies: Secondary | ICD-10-CM | POA: Insufficient documentation

## 2017-12-14 LAB — BASIC METABOLIC PANEL
ANION GAP: 9 (ref 5–15)
BUN: 18 mg/dL (ref 6–20)
CHLORIDE: 99 mmol/L — AB (ref 101–111)
CO2: 33 mmol/L — AB (ref 22–32)
CREATININE: 1.09 mg/dL (ref 0.61–1.24)
Calcium: 9.3 mg/dL (ref 8.9–10.3)
GFR calc non Af Amer: >60 mL/min (ref >60)
GLUCOSE: 262 mg/dL — AB (ref 65–99)
Potassium: 4.6 mmol/L (ref 3.5–5.1)
Sodium: 141 mmol/L (ref 135–145)

## 2017-12-14 LAB — BRAIN NATRIURETIC PEPTIDE: B Natriuretic Peptide: 291.7 pg/mL — ABNORMAL HIGH (ref 0.0–100.0)

## 2017-12-14 MED ORDER — TORSEMIDE 20 MG PO TABS: 60.0000 mg | ORAL_TABLET | Freq: Every day | ORAL | 11 refills | Status: DC

## 2017-12-14 NOTE — Patient Instructions (Signed)
INCREASE Torsemide to 60 mg (3 tabs) once daily.  Routine lab work today. Will notify you of abnormal results, otherwise no news is good news!  Follow up 4 weeks.  ___________________________________________________________________ [place] Code: 1400  Take all medication as prescribed the day of your appointment. Bring all medications with you to your appointment.  Do the following things EVERYDAY: 1) Weigh yourself in the morning before breakfast. Write it down and keep it in a log. 2) Take your medicines as prescribed 3) Eat low salt foods-Limit salt (sodium) to 2000 mg per day.  4) Stay as active as you can everyday 5) Limit all fluids for the day to less than 2 liters

## 2017-12-18 ENCOUNTER — Other Ambulatory Visit (HOSPITAL_COMMUNITY): Payer: Self-pay

## 2017-12-18 NOTE — Progress Notes (Signed)
Paramedicine Encounter    Patient ID: Richard [name], male    DOB: 1960-07-08, 57 y.o.   MRN: [id number]   Patient Care Team: Richard Pilgrim, MD as PCP - General (Family Medicine)  Patient Active Problem List   Diagnosis Date Noted  . S/P right and left heart catheterization 11/02/2017  . Panic attacks 11/02/2017  . Non-STEMI (non-ST elevated myocardial infarction) (HCC)   . 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 Acute on chronic combined systolic and diastolic CHF (congestive heart failure) (HCC)   . COPD (chronic obstructive pulmonary disease) (HCC)   . Hypertension   . CKD (chronic kidney disease)   . 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HCC)   . ICD (implantable cardioverter-defibrillator) in place   . GERD (gastroesophageal reflux disease)   . Diabetes mellitus with diabetic neuropathy, with long-term current use of insulin (HCC)     Current Outpatient Medications:  .  aspirin EC 81 MG EC tablet, Take 1 tablet (81 mg total) by mouth daily., Disp: 90 tablet, Rfl: 0 .  atorvastatin (LIPITOR) 40 MG tablet, Take 1 tablet (40 mg total) by mouth daily at 6 PM., Disp: 30 tablet, Rfl: 3 .  BREO ELLIPTA 100-25 MCG/INH AEPB, Inhale 1 puff into the lungs daily., Disp: , Rfl: 5 .  clopidogrel (PLAVIX) 75 MG tablet, Take 1 tablet (75 mg total) by mouth daily with breakfast., Disp: 30 tablet, Rfl: 3 .  digoxin (LANOXIN) 0.125 MG tablet, Take 1 tablet (0.125 mg total) by mouth daily., Disp: 30 tablet, Rfl: 3 .  DULoxetine (CYMBALTA) 60 MG capsule, Take 60 mg by mouth daily., Disp: , Rfl: 2 .  hydrALAZINE (APRESOLINE) 10 MG tablet, TAKE 1 TABLET(10 MG) BY MOUTH THREE TIMES DAILY, Disp: 270 tablet, Rfl: 0 .  insulin regular human CONCENTRATED (HUMULIN R U-500 KWIKPEN) 500 UNIT/ML kwikpen, USE AS DIRECTED PER SLIDING SCALE. MAX DAILY DOSE IS 400 UNITS., Disp: , Rfl:  .  isosorbide dinitrate (ISORDIL) 10 MG tablet, Take 1 tablet (10 mg total) by  mouth 3 (three) times daily., Disp: 90 tablet, Rfl: 3 .  MAGNESIUM-OXIDE 400 (241.3 Mg) MG tablet, Take 400 mg by mouth daily., Disp: , Rfl: 11 .  omeprazole (PRILOSEC) 20 MG capsule, Take 20 mg by mouth daily., Disp: , Rfl: 1 .  sacubitril-valsartan (ENTRESTO) 97-103 MG, Take 1 tablet by mouth 2 (two) times daily., Disp: 60 tablet, Rfl: 11 .  torsemide (DEMADEX) 20 MG tablet, Take 3 tablets (60 mg total) by mouth daily., Disp: 90 tablet, Rfl: 11 .  Vitamin D, Cholecalciferol, 1000 units CAPS, Take 1,000 Units by mouth daily., Disp: , Rfl: 11 .  diphenhydrAMINE (BENADRYL) 25 MG tablet, Take 25 mg by mouth at bedtime as needed., Disp: , Rfl:  .  Insulin Pen Needle (NOVOFINE) 30G X 8 MM MISC, Inject 10 each into the skin as needed. (Patient not taking: Reported on 12/18/2017), Disp: 100 each, Rfl: 0 Allergies  Allergen Reactions  . Codeine   . Coconut Oil Itching      Social History   Socioeconomic History  . Marital status: Divorced    Spouse name: Not on file  . Number of children: Not on file  . Years of education: Not on file  . Highest education level: Not on file  Occupational History  . Occupation: disability    Comment: stopped working in 2000 d/t occupational exposures  Social Needs  . Financial resource strain: Not on file  .  Food insecurity:    Worry: Not on file    Inability: Not on file  . Transportation needs:    Medical: Not on file    Non-medical: Not on file  Tobacco Use  . Smoking status: Former Smoker    Types: Cigarettes  . Smokeless tobacco: Never Used  . Tobacco comment: "smoked when I drank"  Substance and Sexual Activity  . 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 Drug use: Never  . Sexual activity: Not Currently  Lifestyle  . Physical activity:    Days per week: Not on file    Minutes per session: Not on file  . Stress: Not on file  Relationships  . Social connections:    Talks on phone:  Not on file    Gets together: Not on file    Attends religious service: Not on file    Active member of club or organization: Not on file    Attends meetings of clubs or organizations: Not on file    Relationship status: Not on file  . Intimate partner violence:    Fear of current or ex partner: Not on file    Emotionally abused: Not on file    Physically abused: Not on file    Forced sexual activity: Not on file  Other Topics Concern  . Not on file  Social History Narrative   Patient lives at home with wife and some of his 10 children. He self-administers his own medications.    Physical Exam  Constitutional: He is oriented to person, place, and time.  Pulmonary/Chest: Effort normal and breath sounds normal.  Abdominal: Soft.  Musculoskeletal: Normal range of motion. He exhibits edema.  Neurological: He is alert and oriented to person, place, and time.  Skin: Skin is warm and dry.  Psychiatric: He has a normal mood and affect.        Future Appointments  Date Time Provider Department Center  01/18/2018 12:00 PM [facility] PA/NP [facility] None  03/04/2018  7:15 AM [facility] DEVICE REMOTES [facility]    BP 116/70 (BP Location: Left Arm, Patient Position: Sitting, Cuff Size: Large)   Pulse (!) 44   Resp 16   Wt 250 lb 8 oz (113.6 kg)   SpO2 94%   BMI 28.95 kg/m   Weight yesterday- 246.9 lb Last visit weight- 253 lb    Richard [name] was seen at home today and reported feeling well. He denied SOB, headache, dizziness or orthopnea. He reported sleeping well and being compliant with his medications. His only complaint today was related to numbness in his hands and feet which has been ongoing. He stated he has not had any lyrica in several weeks because the prescribing physician was rude to him and would not give him a refill. I suggested that he seek the care of another physician if he is having these symptoms and does not feel respected by he current neurologist. He was  agreeable to this and stated he was going to have his PCP refer him to another provider when he sees them next. His medications were verified and his pillbox was refilled. His vital signs were checked and seemed to be within normal limits with the exception of his HR. The HR presented the same as late week, with his auscultated heart sounds at approximately 80 bpm but his radial pulse at 44. I will speak with the clinic about this but since  he is asymptomatic and I spoke with Otilio Saber, PA-C, last week in reference to this, I did not call immediately.   [name], EMT 12/18/17  ACTION: Home visit completed Next visit planned for 1 week

## 2017-12-24 ENCOUNTER — Telehealth (HOSPITAL_COMMUNITY): Payer: Self-pay

## 2017-12-24 NOTE — Telephone Encounter (Signed)
I calkled Richard [name] to schedule an appointment. He did not answer and the voicemail stated "This is [name]. Please Leave a message." I confirmed that the number was correct and then left a message stating my name and number and requesting a call back. No patient information was given on the voicemail.

## 2017-12-25 ENCOUNTER — Other Ambulatory Visit: Payer: Self-pay | Admitting: Physician Assistant

## 2017-12-26 ENCOUNTER — Other Ambulatory Visit (HOSPITAL_COMMUNITY): Payer: Self-pay

## 2017-12-26 ENCOUNTER — Other Ambulatory Visit (HOSPITAL_COMMUNITY): Payer: Self-pay | Admitting: *Deleted

## 2017-12-26 MED ORDER — PREGABALIN 100 MG PO CAPS: 100.0000 mg | ORAL_CAPSULE | Freq: Two times a day (BID) | ORAL | 3 refills | Status: DC

## 2017-12-26 MED ORDER — ALLOPURINOL 100 MG PO TABS: 100.0000 mg | ORAL_TABLET | Freq: Every day | ORAL | 3 refills | Status: DC

## 2017-12-26 NOTE — Progress Notes (Signed)
Paramedicine Encounter    Patient ID: Richard [name], male    DOB: [birth year], 57 y.o.   MRN: [id number]   Patient Care Team: Richard Pilgrim, MD as PCP - General (Family Medicine)  Patient Active Problem List   Diagnosis Date Noted  . S/P right and left heart catheterization 11/02/2017  . Panic attacks 11/02/2017  . Non-STEMI (non-ST elevated myocardial infarction) (HCC)   . 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 Acute on chronic combined systolic and diastolic CHF (congestive heart failure) (HCC)   . COPD (chronic obstructive pulmonary disease) (HCC)   . Hypertension   . CKD (chronic kidney disease)   . 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HCC)   . ICD (implantable cardioverter-defibrillator) in place   . GERD (gastroesophageal reflux disease)   . Diabetes mellitus with diabetic neuropathy, with long-term current use of insulin (HCC)     Current Outpatient Medications:  .  aspirin EC 81 MG EC tablet, Take 1 tablet (81 mg total) by mouth daily., Disp: 90 tablet, Rfl: 0 .  atorvastatin (LIPITOR) 40 MG tablet, Take 1 tablet (40 mg total) by mouth daily at 6 PM., Disp: 30 tablet, Rfl: 3 .  BREO ELLIPTA 100-25 MCG/INH AEPB, Inhale 1 puff into the lungs daily., Disp: , Rfl: 5 .  clopidogrel (PLAVIX) 75 MG tablet, Take 1 tablet (75 mg total) by mouth daily with breakfast., Disp: 30 tablet, Rfl: 3 .  digoxin (LANOXIN) 0.125 MG tablet, Take 1 tablet (0.125 mg total) by mouth daily., Disp: 30 tablet, Rfl: 3 .  diphenhydrAMINE (BENADRYL) 25 MG tablet, Take 25 mg by mouth at bedtime as needed., Disp: , Rfl:  .  DULoxetine (CYMBALTA) 60 MG capsule, Take 60 mg by mouth daily., Disp: , Rfl: 2 .  hydrALAZINE (APRESOLINE) 10 MG tablet, TAKE 1 TABLET(10 MG) BY MOUTH THREE TIMES DAILY, Disp: 270 tablet, Rfl: 0 .  insulin regular human CONCENTRATED (HUMULIN R U-500 KWIKPEN) 500 UNIT/ML kwikpen, USE AS DIRECTED PER SLIDING SCALE. MAX DAILY DOSE IS 400  UNITS., Disp: , Rfl:  .  isosorbide dinitrate (ISORDIL) 10 MG tablet, Take 1 tablet (10 mg total) by mouth 3 (three) times daily., Disp: 90 tablet, Rfl: 3 .  MAGNESIUM-OXIDE 400 (241.3 Mg) MG tablet, Take 400 mg by mouth daily., Disp: , Rfl: 11 .  omeprazole (PRILOSEC) 20 MG capsule, Take 20 mg by mouth daily., Disp: , Rfl: 1 .  sacubitril-valsartan (ENTRESTO) 97-103 MG, Take 1 tablet by mouth 2 (two) times daily., Disp: 60 tablet, Rfl: 11 .  torsemide (DEMADEX) 20 MG tablet, Take 3 tablets (60 mg total) by mouth daily., Disp: 90 tablet, Rfl: 11 .  Insulin Pen Needle (NOVOFINE) 30G X 8 MM MISC, Inject 10 each into the skin as needed. (Patient not taking: Reported on 12/26/2017), Disp: 100 each, Rfl: 0 .  Vitamin D, Cholecalciferol, 1000 units CAPS, Take 1,000 Units by mouth daily., Disp: , Rfl: 11 Allergies  Allergen Reactions  . Codeine   . Coconut Oil Itching      Social History   Socioeconomic History  . Marital status: Divorced    Spouse name: Not on file  . Number of children: Not on file  . Years of education: Not on file  . Highest education level: Not on file  Occupational History  . Occupation: disability    Comment: stopped working in 2000 d/t occupational exposures  Social Needs  . Financial resource strain: Not on file  .  Food insecurity:    Worry: Not on file    Inability: Not on file  . Transportation needs:    Medical: Not on file    Non-medical: Not on file  Tobacco Use  . Smoking status: Former Smoker    Types: Cigarettes  . Smokeless tobacco: Never Used  . Tobacco comment: "smoked when I drank"  Substance and Sexual Activity  . 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 Drug use: Never  . Sexual activity: Not Currently  Lifestyle  . Physical activity:    Days per week: Not on file    Minutes per session: Not on file  . Stress: Not on file  Relationships  . Social connections:    Talks on phone:  Not on file    Gets together: Not on file    Attends religious service: Not on file    Active member of club or organization: Not on file    Attends meetings of clubs or organizations: Not on file    Relationship status: Not on file  . Intimate partner violence:    Fear of current or ex partner: Not on file    Emotionally abused: Not on file    Physically abused: Not on file    Forced sexual activity: Not on file  Other Topics Concern  . Not on file  Social History Narrative   Patient lives at home with wife and some of his 10 children. He self-administers his own medications.    Physical Exam  Constitutional: He is oriented to person, place, and time.  Cardiovascular: Regular rhythm.  Pulmonary/Chest: Effort normal and breath sounds normal.  Musculoskeletal: Normal range of motion. He exhibits edema.  Neurological: He is alert and oriented to person, place, and time.  Skin: Skin is warm and dry.  Psychiatric: He has a normal mood and affect.        Future Appointments  Date Time Provider Department Center  01/18/2018 12:00 PM [facility] PA/NP [facility] None  03/04/2018  7:15 AM [facility] DEVICE REMOTES [facility]    BP 110/64 (BP Location: Left Arm, Patient Position: Sitting, Cuff Size: Large)   Pulse (!) 48   Resp 16   Wt 258 lb 12.8 oz (117.4 kg)   SpO2 90%   BMI 29.91 kg/m   Weight yesterday- 258 lb Last visit weight-  250.5 lb  Mr Richard [name] was seen at home today and reported feeling well denying SOB, headache, dizziness or orthopnea. He has started taking allopurinol 100 mg daily and Lyrica 100 mg BID. He had filled his pillbox prior to my arrival however after checking it to ensure it was done correctly I noted that he placed Lyrica 200 mg BID in his box. He stated he did this because the 100 mg twice daily is not strong enough so he increased his dose without consulting his physician. I explained why he should only take the medications as prescribed and he  was understanding and removed the extra pills from his box. He is using a gallon jug to freeze 2 L of water each night and then drinks that jug throughout the next day. His weight was up by 8 pounds since last week so I instructed him to take an additional torsemide and see how he does over the holiday.  [name], EMT 12/26/17  ACTION: Home visit completed Next visit planned for 1 week

## 2018-01-02 ENCOUNTER — Other Ambulatory Visit (HOSPITAL_COMMUNITY): Payer: Self-pay

## 2018-01-02 NOTE — Progress Notes (Signed)
Paramedicine Encounter    Patient ID: Richard [name], male    DOB: 08/02/1960, 57 y.o.   MRN: [id number]   Patient Care Team: Ananias Pilgrim, MD as PCP - General (Family Medicine)  Patient Active Problem List   Diagnosis Date Noted  . S/P right and left heart catheterization 11/02/2017  . Panic attacks 11/02/2017  . Non-STEMI (non-ST elevated myocardial infarction) (HCC)   . 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 Acute on chronic combined systolic and diastolic CHF (congestive heart failure) (HCC)   . COPD (chronic obstructive pulmonary disease) (HCC)   . Hypertension   . CKD (chronic kidney disease)   . 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HCC)   . ICD (implantable cardioverter-defibrillator) in place   . GERD (gastroesophageal reflux disease)   . Diabetes mellitus with diabetic neuropathy, with long-term current use of insulin (HCC)     Current Outpatient Medications:  .  allopurinol (ZYLOPRIM) 100 MG tablet, Take 1 tablet (100 mg total) by mouth daily., Disp: 30 tablet, Rfl: 3 .  aspirin EC 81 MG EC tablet, Take 1 tablet (81 mg total) by mouth daily., Disp: 90 tablet, Rfl: 0 .  atorvastatin (LIPITOR) 40 MG tablet, Take 1 tablet (40 mg total) by mouth daily at 6 PM., Disp: 30 tablet, Rfl: 3 .  BREO ELLIPTA 100-25 MCG/INH AEPB, Inhale 1 puff into the lungs daily., Disp: , Rfl: 5 .  clopidogrel (PLAVIX) 75 MG tablet, Take 1 tablet (75 mg total) by mouth daily with breakfast., Disp: 30 tablet, Rfl: 3 .  digoxin (LANOXIN) 0.125 MG tablet, Take 1 tablet (0.125 mg total) by mouth daily., Disp: 30 tablet, Rfl: 3 .  diphenhydrAMINE (BENADRYL) 25 MG tablet, Take 25 mg by mouth at bedtime as needed., Disp: , Rfl:  .  DULoxetine (CYMBALTA) 60 MG capsule, Take 60 mg by mouth daily., Disp: , Rfl: 2 .  hydrALAZINE (APRESOLINE) 10 MG tablet, TAKE 1 TABLET(10 MG) BY MOUTH THREE TIMES DAILY, Disp: 270 tablet, Rfl: 0 .  insulin regular human  CONCENTRATED (HUMULIN R U-500 KWIKPEN) 500 UNIT/ML kwikpen, USE AS DIRECTED PER SLIDING SCALE. MAX DAILY DOSE IS 400 UNITS., Disp: , Rfl:  .  isosorbide dinitrate (ISORDIL) 10 MG tablet, Take 1 tablet (10 mg total) by mouth 3 (three) times daily., Disp: 90 tablet, Rfl: 3 .  MAGNESIUM-OXIDE 400 (241.3 Mg) MG tablet, Take 400 mg by mouth daily., Disp: , Rfl: 11 .  omeprazole (PRILOSEC) 20 MG capsule, Take 20 mg by mouth daily., Disp: , Rfl: 1 .  pregabalin (LYRICA) 100 MG capsule, Take 1 capsule (100 mg total) by mouth 2 (two) times daily., Disp: 60 capsule, Rfl: 3 .  sacubitril-valsartan (ENTRESTO) 97-103 MG, Take 1 tablet by mouth 2 (two) times daily., Disp: 60 tablet, Rfl: 11 .  torsemide (DEMADEX) 20 MG tablet, Take 3 tablets (60 mg total) by mouth daily., Disp: 90 tablet, Rfl: 11 .  Vitamin D, Cholecalciferol, 1000 units CAPS, Take 1,000 Units by mouth daily., Disp: , Rfl: 11 .  Insulin Pen Needle (NOVOFINE) 30G X 8 MM MISC, Inject 10 each into the skin as needed. (Patient not taking: Reported on 12/26/2017), Disp: 100 each, Rfl: 0 Allergies  Allergen Reactions  . Codeine   . Coconut Oil Itching      Social History   Socioeconomic History  . Marital status: Divorced    Spouse name: Not on file  . Number of children: Not on file  .  Years of education: Not on file  . Highest education level: Not on file  Occupational History  . Occupation: disability    Comment: stopped working in 2000 d/t occupational exposures  Social Needs  . Financial resource strain: Not on file  . Food insecurity:    Worry: Not on file    Inability: Not on file  . Transportation needs:    Medical: Not on file    Non-medical: Not on file  Tobacco Use  . Smoking status: Former Smoker    Types: Cigarettes  . Smokeless tobacco: Never Used  . Tobacco comment: "smoked when I drank"  Substance and Sexual Activity  . Alcohol use: Not Currently    Comment: used to drink multiple cases of beer daily, quit 2018   . Drug use: Never  . Sexual activity: Not Currently  Lifestyle  . Physical activity:    Days per week: Not on file    Minutes per session: Not on file  . Stress: Not on file  Relationships  . Social connections:    Talks on phone: Not on file    Gets together: Not on file    Attends religious service: Not on file    Active member of club or organization: Not on file    Attends meetings of clubs or organizations: Not on file    Relationship status: Not on file  . Intimate partner violence:    Fear of current or ex partner: Not on file    Emotionally abused: Not on file    Physically abused: Not on file    Forced sexual activity: Not on file  Other Topics Concern  . Not on file  Social History Narrative   Patient lives at home with wife and some of his 10 children. He self-administers his own medications.    Physical Exam  Constitutional: He is oriented to person, place, and time.  Cardiovascular: Regular rhythm. Bradycardia present.  Pulmonary/Chest: Effort normal and breath sounds normal.  Abdominal: Soft.  Musculoskeletal: Normal range of motion. He exhibits edema.  Neurological: He is alert and oriented to person, place, and time.  Skin: Skin is warm and dry.  Psychiatric: He has a normal mood and affect.        Future Appointments  Date Time Provider Department Center  01/18/2018 12:00 PM [facility] PA/NP [facility] None  03/04/2018  7:15 AM [facility] DEVICE REMOTES [facility]    BP 98/64 (BP Location: Left Arm, Patient Position: Sitting, Cuff Size: Large)   Pulse (!) 48   Resp 16   Wt 258 lb (117 kg)   SpO2 92%   BMI 29.81 kg/m   Weight yesterday- did not weigh Last visit weight- 258.8 lb  Richard [name] was seen at home today. He stated he has been feeling unsteady on his feet and somewhat "jittery." He also reported BLEE, though it did not appear to have increased much since last week. He reported that he is drinking 1.5 L of water daily (measured by  drinking three 500 ml bottles). The only major difference from last week has been they are going through a move so he has been more active as a result. I suggested that being on his feet more could contribute some to the swelling and he should be mindful to not over exert himself. He was understanding and agreeable. His medications were verified and his pillbox was refilled. He is nearly out of Lyrica and he has been unhappy with his neurologist for the  past several weeks so I will contact [facility] to see if they are taking new patients and try to get him set up. His medications were verified and his pillbox was refilled. No other issues noted at this time.    [name], EMT 01/02/18  ACTION: Home visit completed Next visit planned for 1 week

## 2018-01-09 ENCOUNTER — Other Ambulatory Visit (HOSPITAL_COMMUNITY): Payer: Self-pay

## 2018-01-09 NOTE — Progress Notes (Signed)
Paramedicine Encounter    Patient ID: Richard [name], male    DOB: May 03, 1961, 57 y.o.   MRN: [id number]   Patient Care Team: Ananias Pilgrim, MD as PCP - General (Family Medicine)  Patient Active Problem List   Diagnosis Date Noted  . S/P right and left heart catheterization 11/02/2017  . Panic attacks 11/02/2017  . Non-STEMI (non-ST elevated myocardial infarction) (HCC)   . 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 Acute on chronic combined systolic and diastolic CHF (congestive heart failure) (HCC)   . COPD (chronic obstructive pulmonary disease) (HCC)   . Hypertension   . CKD (chronic kidney disease)   . 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HCC)   . ICD (implantable cardioverter-defibrillator) in place   . GERD (gastroesophageal reflux disease)   . Diabetes mellitus with diabetic neuropathy, with long-term current use of insulin (HCC)     Current Outpatient Medications:  .  allopurinol (ZYLOPRIM) 100 MG tablet, Take 1 tablet (100 mg total) by mouth daily., Disp: 30 tablet, Rfl: 3 .  aspirin EC 81 MG EC tablet, Take 1 tablet (81 mg total) by mouth daily., Disp: 90 tablet, Rfl: 0 .  atorvastatin (LIPITOR) 40 MG tablet, Take 1 tablet (40 mg total) by mouth daily at 6 PM., Disp: 30 tablet, Rfl: 3 .  BREO ELLIPTA 100-25 MCG/INH AEPB, Inhale 1 puff into the lungs daily., Disp: , Rfl: 5 .  clopidogrel (PLAVIX) 75 MG tablet, Take 1 tablet (75 mg total) by mouth daily with breakfast., Disp: 30 tablet, Rfl: 3 .  digoxin (LANOXIN) 0.125 MG tablet, Take 1 tablet (0.125 mg total) by mouth daily., Disp: 30 tablet, Rfl: 3 .  diphenhydrAMINE (BENADRYL) 25 MG tablet, Take 25 mg by mouth at bedtime as needed., Disp: , Rfl:  .  hydrALAZINE (APRESOLINE) 10 MG tablet, TAKE 1 TABLET(10 MG) BY MOUTH THREE TIMES DAILY, Disp: 270 tablet, Rfl: 0 .  insulin regular human CONCENTRATED (HUMULIN R U-500 KWIKPEN) 500 UNIT/ML kwikpen, USE AS DIRECTED PER SLIDING  SCALE. MAX DAILY DOSE IS 400 UNITS., Disp: , Rfl:  .  isosorbide dinitrate (ISORDIL) 10 MG tablet, Take 1 tablet (10 mg total) by mouth 3 (three) times daily., Disp: 90 tablet, Rfl: 3 .  MAGNESIUM-OXIDE 400 (241.3 Mg) MG tablet, Take 400 mg by mouth daily., Disp: , Rfl: 11 .  omeprazole (PRILOSEC) 20 MG capsule, Take 20 mg by mouth daily., Disp: , Rfl: 1 .  pregabalin (LYRICA) 100 MG capsule, Take 1 capsule (100 mg total) by mouth 2 (two) times daily., Disp: 60 capsule, Rfl: 3 .  sacubitril-valsartan (ENTRESTO) 97-103 MG, Take 1 tablet by mouth 2 (two) times daily., Disp: 60 tablet, Rfl: 11 .  torsemide (DEMADEX) 20 MG tablet, Take 3 tablets (60 mg total) by mouth daily., Disp: 90 tablet, Rfl: 11 .  Vitamin D, Cholecalciferol, 1000 units CAPS, Take 1,000 Units by mouth daily., Disp: , Rfl: 11 .  DULoxetine (CYMBALTA) 60 MG capsule, Take 60 mg by mouth daily., Disp: , Rfl: 2 .  Insulin Pen Needle (NOVOFINE) 30G X 8 MM MISC, Inject 10 each into the skin as needed. (Patient not taking: Reported on 12/26/2017), Disp: 100 each, Rfl: 0 Allergies  Allergen Reactions  . Codeine   . Coconut Oil Itching      Social History   Socioeconomic History  . Marital status: Divorced    Spouse name: Not on file  . Number of children: Not on file  .  Years of education: Not on file  . Highest education level: Not on file  Occupational History  . Occupation: disability    Comment: stopped working in 2000 d/t occupational exposures  Social Needs  . Financial resource strain: Not on file  . Food insecurity:    Worry: Not on file    Inability: Not on file  . Transportation needs:    Medical: Not on file    Non-medical: Not on file  Tobacco Use  . Smoking status: Former Smoker    Types: Cigarettes  . Smokeless tobacco: Never Used  . Tobacco comment: "smoked when I drank"  Substance and Sexual Activity  . 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  Drug use: Never  . Sexual activity: Not Currently  Lifestyle  . Physical activity:    Days per week: Not on file    Minutes per session: Not on file  . Stress: Not on file  Relationships  . Social connections:    Talks on phone: Not on file    Gets together: Not on file    Attends religious service: Not on file    Active member of club or organization: Not on file    Attends meetings of clubs or organizations: Not on file    Relationship status: Not on file  . Intimate partner violence:    Fear of current or ex partner: Not on file    Emotionally abused: Not on file    Physically abused: Not on file    Forced sexual activity: Not on file  Other Topics Concern  . Not on file  Social History Narrative   Patient lives at home with wife and some of his 10 children. He self-administers his own medications.    Physical Exam  Constitutional: He is oriented to person, place, and time.  Cardiovascular: Regular rhythm. Bradycardia present.  Pulmonary/Chest: Effort normal and breath sounds normal.  Abdominal: Soft.  Musculoskeletal: Normal range of motion. He exhibits edema.  Neurological: He is alert and oriented to person, place, and time.  Skin: Skin is warm and dry.  Psychiatric: He has a normal mood and affect.        Future Appointments  Date Time Provider Department Center  01/18/2018 12:00 PM [facility] PA/NP [facility] None  03/04/2018  7:15 AM [facility] DEVICE REMOTES [facility]    BP 108/70 (BP Location: Left Arm, Patient Position: Sitting, Cuff Size: Large)   Pulse (!) 44   Resp 16   Wt 255 lb (115.7 kg)   SpO2 92%   BMI 29.47 kg/m   Weight yesterday- did not weigh Last visit weight- 258 lb  Mr Popelka was seen at home today and reported feeling well. He denied SOB, headache, dizziness or orthopnea. He has been taking his medications but missed doses of allopurinol and digoxin last week and said he has only been taking 40 mg of torsemide daily instead  of 60 mg as directed. He said he felt like 60 mg was making him sick. His weight is stable from last week but I encouraged him to start taking his medications as directed. His medications were verified and his pillbox was refilled. He is going to run out of Lyrica tomorrow however he does not have a refill and has not attempted to make a new patient appointment with [facility] which I advised him to do last week.  [name], EMT 01/09/18  ACTION: Home visit completed Next visit planned for 1 week

## 2018-01-15 ENCOUNTER — Telehealth (HOSPITAL_COMMUNITY): Payer: Self-pay

## 2018-01-15 NOTE — Telephone Encounter (Signed)
I called Mr Mcmanigle to schedule an appointment for tomorrow. He stated he would be home all day so we agreed to meet between 10:00 and 10:30.

## 2018-01-16 ENCOUNTER — Other Ambulatory Visit (HOSPITAL_COMMUNITY): Payer: Self-pay

## 2018-01-16 NOTE — Progress Notes (Signed)
Paramedicine Encounter    Patient ID: Richard [name], male    DOB: 1961-01-01, 57 y.o.   MRN: [id number]   Patient Care Team: Richard Pilgrim, MD as PCP - General (Family Medicine)  Patient Active Problem List   Diagnosis Date Noted  . S/P right and left heart catheterization 11/02/2017  . Panic attacks 11/02/2017  . Non-STEMI (non-ST elevated myocardial infarction) (HCC)   . 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 Acute on chronic combined systolic and diastolic CHF (congestive heart failure) (HCC)   . COPD (chronic obstructive pulmonary disease) (HCC)   . Hypertension   . CKD (chronic kidney disease)   . 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HCC)   . ICD (implantable cardioverter-defibrillator) in place   . GERD (gastroesophageal reflux disease)   . Diabetes mellitus with diabetic neuropathy, with long-term current use of insulin (HCC)     Current Outpatient Medications:  .  allopurinol (ZYLOPRIM) 100 MG tablet, Take 1 tablet (100 mg total) by mouth daily., Disp: 30 tablet, Rfl: 3 .  aspirin EC 81 MG EC tablet, Take 1 tablet (81 mg total) by mouth daily., Disp: 90 tablet, Rfl: 0 .  atorvastatin (LIPITOR) 40 MG tablet, Take 1 tablet (40 mg total) by mouth daily at 6 PM., Disp: 30 tablet, Rfl: 3 .  BREO ELLIPTA 100-25 MCG/INH AEPB, Inhale 1 puff into the lungs daily., Disp: , Rfl: 5 .  clopidogrel (PLAVIX) 75 MG tablet, Take 1 tablet (75 mg total) by mouth daily with breakfast., Disp: 30 tablet, Rfl: 3 .  digoxin (LANOXIN) 0.125 MG tablet, Take 1 tablet (0.125 mg total) by mouth daily., Disp: 30 tablet, Rfl: 3 .  diphenhydrAMINE (BENADRYL) 25 MG tablet, Take 25 mg by mouth at bedtime as needed., Disp: , Rfl:  .  DULoxetine (CYMBALTA) 60 MG capsule, Take 60 mg by mouth daily., Disp: , Rfl: 2 .  hydrALAZINE (APRESOLINE) 10 MG tablet, TAKE 1 TABLET(10 MG) BY MOUTH THREE TIMES DAILY, Disp: 270 tablet, Rfl: 0 .  insulin regular human  CONCENTRATED (HUMULIN R U-500 KWIKPEN) 500 UNIT/ML kwikpen, USE AS DIRECTED PER SLIDING SCALE. MAX DAILY DOSE IS 400 UNITS., Disp: , Rfl:  .  isosorbide dinitrate (ISORDIL) 10 MG tablet, Take 1 tablet (10 mg total) by mouth 3 (three) times daily., Disp: 90 tablet, Rfl: 3 .  MAGNESIUM-OXIDE 400 (241.3 Mg) MG tablet, Take 400 mg by mouth daily., Disp: , Rfl: 11 .  omeprazole (PRILOSEC) 20 MG capsule, Take 20 mg by mouth daily., Disp: , Rfl: 1 .  sacubitril-valsartan (ENTRESTO) 97-103 MG, Take 1 tablet by mouth 2 (two) times daily., Disp: 60 tablet, Rfl: 11 .  torsemide (DEMADEX) 20 MG tablet, Take 3 tablets (60 mg total) by mouth daily., Disp: 90 tablet, Rfl: 11 .  Vitamin D, Cholecalciferol, 1000 units CAPS, Take 1,000 Units by mouth daily., Disp: , Rfl: 11 .  Insulin Pen Needle (NOVOFINE) 30G X 8 MM MISC, Inject 10 each into the skin as needed. (Patient not taking: Reported on 12/26/2017), Disp: 100 each, Rfl: 0 .  pregabalin (LYRICA) 100 MG capsule, Take 1 capsule (100 mg total) by mouth 2 (two) times daily. (Patient not taking: Reported on 01/16/2018), Disp: 60 capsule, Rfl: 3 Allergies  Allergen Reactions  . Codeine   . Coconut Oil Itching      Social History   Socioeconomic History  . Marital status: Divorced    Spouse name: Not on file  .  Number of children: Not on file  . Years of education: Not on file  . Highest education level: Not on file  Occupational History  . Occupation: disability    Comment: stopped working in 2000 d/t occupational exposures  Social Needs  . Financial resource strain: Not on file  . Food insecurity:    Worry: Not on file    Inability: Not on file  . Transportation needs:    Medical: Not on file    Non-medical: Not on file  Tobacco Use  . Smoking status: Former Smoker    Types: Cigarettes  . Smokeless tobacco: Never Used  . Tobacco comment: "smoked when I drank"  Substance and Sexual Activity  . Alcohol use: Not Currently    Comment: used to  drink multiple cases of beer daily, quit 2018  . Drug use: Never  . Sexual activity: Not Currently  Lifestyle  . Physical activity:    Days per week: Not on file    Minutes per session: Not on file  . Stress: Not on file  Relationships  . Social connections:    Talks on phone: Not on file    Gets together: Not on file    Attends religious service: Not on file    Active member of club or organization: Not on file    Attends meetings of clubs or organizations: Not on file    Relationship status: Not on file  . Intimate partner violence:    Fear of current or ex partner: Not on file    Emotionally abused: Not on file    Physically abused: Not on file    Forced sexual activity: Not on file  Other Topics Concern  . Not on file  Social History Narrative   Patient lives at home with wife and some of his 10 children. He self-administers his own medications.    Physical Exam  Constitutional: He is oriented to person, place, and time.  Cardiovascular: Regular rhythm. Bradycardia present.  Pulmonary/Chest: Effort normal and breath sounds normal.  Abdominal: Soft. He exhibits no distension.  Musculoskeletal: Normal range of motion. He exhibits edema.  Neurological: He is alert and oriented to person, place, and time.  Skin: Skin is warm and dry.  Psychiatric: He has a normal mood and affect.        Future Appointments  Date Time Provider Department Center  01/18/2018 12:00 PM [facility] PA/NP [facility] None  03/04/2018  7:15 AM [facility] DEVICE REMOTES [facility]    BP 100/62 (BP Location: Left Arm, Patient Position: Sitting, Cuff Size: Large)   Pulse (!) 48   Resp 18   Wt 254 lb (115.2 kg)   SpO2 93%   BMI 29.35 kg/m   Weight yesterday- 254 lb Last visit weight- 255 lb  Richard [name] was seen at home today and reported feeling generally well. He did complain of intermittent dizziness with exertion and also when he stands up quickly but was not symptomatic at the  time of our visit. He has been compliant with his medications but did not follow a low sodium diet while on vacation over the weekend. His medications were verified and his pillbox was refilled. Necessary refills were called into the pharmacy. He was reminded of his appointment on Friday and confirmed that he would be there.   [name], EMT 01/16/18  ACTION: Home visit completed Next visit planned for 1 week

## 2018-01-17 NOTE — Progress Notes (Signed)
Advanced Heart Failure Clinic Note   PCP: Ananias Pilgrim, MD PCP-Cardiologist: Dr Shirlee Latch  HPI: Richard [name] is a 57 y.o. male with a history of chronic systolic HF s/p Medtronic ICD (12/2016), HTN, DM, and HLD.   Admitted 5/8-5/14/19 with A/C systolic HF. Echo showed EF 15-20%. Advanced HF team was consulted. He diuresed 20 lbs with IV lasix, then transitioned to torsemide 40 mg daily. Underwent R/LHC. Results below. HF meds were optimized. Betablocker was not started due to low CI and soft BPs. He was discharged on home oxygen. Referred to cardiac rehab. DC weight: 260 lbs.   He presents today for regular visit. Last visit torsemide increased. He has been feeling good overall. He denies orthopnea, PND, or SOB. No fevers or chills. Appetite has been OK. He is drinking > 2 L a day. Only taking 20 mg torsemide despite recent increases, he is unsure why this is.  Followed by paramedicine. No longer using oxygen.   Echo 11/01/17: EF 15-20%, grade 2 DD, mild MR, RV severely reduced, LA moderately dilated, RA mildly dilated, midl TR,   LHC/RHC (5/10):  Coronary Findings  Diagnostic  Dominance: Right  Left Main  No significant disease.  Left Anterior Descending  80% distal LAD stenosis.  Ramus Intermedius  Moderate ramus with luminal irregularities.  Left Circumflex  Luminal irregularities.  Right Coronary Artery  Focal 80% stenosis proximal RCA.  Intervention  Successful direct stent PCI on the proximal RCA with 85% stenosis and TIMI grade III flow reduced to 0% with TIMI Grade 3 Flow. Right Heart   Right Heart Pressures RHC Procedural Findings: Hemodynamics (mmHg)  RA 13 RV 67/18 PA 69/31, mean 47 PCWP mean 24 LV 128/33 AO 117/85  Oxygen saturations: PA 66% AO 98%  Cardiac Output (Fick) 4.8  Cardiac Index (Fick) 1.91 PVR 4.8 WU   Review of systems complete and found to be negative unless listed in HPI.    Past Medical History:  Diagnosis Date  . AICD (automatic  cardioverter/defibrillator) present 01/11/2017  . Anxiety   . CKD (chronic kidney disease)   . COPD (chronic obstructive pulmonary disease) (HCC)    never smoker, industrial exposure  . Diabetes mellitus with diabetic neuropathy, with long-term current use of insulin (HCC)   . GERD (gastroesophageal reflux disease)   . High cholesterol   . History of gout    "take RX qd" (11/01/2017)  . Hypertension   . Nonobstructive atherosclerosis of coronary artery   . On home oxygen therapy    "2L prn" (11/01/2017)  . 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HCC)    LLL, present for years  . Systolic HF (heart failure) (HCC)    Current Outpatient Medications  Medication Sig Dispense Refill  . allopurinol (ZYLOPRIM) 100 MG tablet Take 1 tablet (100 mg total) by mouth daily. 30 tablet 3  . aspirin EC 81 MG EC tablet Take 1 tablet (81 mg total) by mouth daily. 90 tablet 0  . atorvastatin (LIPITOR) 40 MG tablet Take 1 tablet (40 mg total) by mouth daily at 6 PM. 30 tablet 3  . BREO ELLIPTA 100-25 MCG/INH AEPB Inhale 1 puff into the lungs daily.  5  . clopidogrel (PLAVIX) 75 MG tablet Take 1 tablet (75 mg total) by mouth daily with breakfast. 30 tablet 3  . digoxin (LANOXIN) 0.125 MG tablet Take 1 tablet (0.125 mg total) by mouth daily. 30 tablet 3  . diphenhydrAMINE (BENADRYL) 25 MG tablet Take 25 mg by mouth at bedtime as needed.    Marland Kitchen  DULoxetine (CYMBALTA) 60 MG capsule Take 60 mg by mouth daily.  2  . hydrALAZINE (APRESOLINE) 10 MG tablet TAKE 1 TABLET(10 MG) BY MOUTH THREE TIMES DAILY 270 tablet 0  . Insulin Pen Needle (NOVOFINE) 30G X 8 MM MISC Inject 10 each into the skin as needed. 100 each 0  . insulin regular human CONCENTRATED (HUMULIN R U-500 KWIKPEN) 500 UNIT/ML kwikpen USE AS DIRECTED PER SLIDING SCALE. MAX DAILY DOSE IS 400 UNITS.    Marland Kitchen isosorbide dinitrate (ISORDIL) 10 MG tablet Take 1 tablet (10 mg total) by mouth 3 (three) times daily. 90 tablet 3  . MAGNESIUM-OXIDE 400 (241.3 Mg) MG tablet Take 400 mg  by mouth daily.  11  . omeprazole (PRILOSEC) 20 MG capsule Take 20 mg by mouth daily.  1  . pregabalin (LYRICA) 100 MG capsule Take 1 capsule (100 mg total) by mouth 2 (two) times daily. 60 capsule 3  . sacubitril-valsartan (ENTRESTO) 97-103 MG Take 1 tablet by mouth 2 (two) times daily. 60 tablet 11  . torsemide (DEMADEX) 20 MG tablet Take 20 mg by mouth daily.    . Vitamin D, Cholecalciferol, 1000 units CAPS Take 1,000 Units by mouth daily.  11   No current facility-administered medications for this encounter.    Allergies  Allergen Reactions  . Codeine   . Coconut Oil Itching      Social History   Socioeconomic History  . Marital status: Divorced    Spouse name: Not on file  . Number of children: Not on file  . Years of education: Not on file  . Highest education level: Not on file  Occupational History  . Occupation: disability    Comment: stopped working in 2000 d/t occupational exposures  Social Needs  . Financial resource strain: Not on file  . Food insecurity:    Worry: Not on file    Inability: Not on file  . Transportation needs:    Medical: Not on file    Non-medical: Not on file  Tobacco Use  . Smoking status: Former Smoker    Types: Cigarettes  . Smokeless tobacco: Never Used  . Tobacco comment: "smoked when I drank"  Substance and Sexual Activity  . 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 Drug use: Never  . Sexual activity: Not Currently  Lifestyle  . Physical activity:    Days per week: Not on file    Minutes per session: Not on file  . Stress: Not on file  Relationships  . Social connections:    Talks on phone: Not on file    Gets together: Not on file    Attends religious service: Not on file    Active member of club or organization: Not on file    Attends meetings of clubs or organizations: Not on file    Relationship status: Not on file  . Intimate partner violence:    Fear of current  or ex partner: Not on file    Emotionally abused: Not on file    Physically abused: Not on file    Forced sexual activity: Not on file  Other Topics Concern  . Not on file  Social History Narrative   Patient lives at home with wife and some of his 10 children. He self-administers his own medications.   Family History  Problem Relation Age of Onset  . Hypertension Mother   . Diabetes Mother   .  Hypertension Father   . Diabetes Father   . Diabetes Sister   . Diabetes Brother    Vitals:   01/18/18 1225 01/18/18 1226  BP: 92/64 (!) 88/60  Pulse: 64   SpO2: 96%   Weight: 258 lb (117 kg)      Wt Readings from Last 3 Encounters:  01/18/18 258 lb (117 kg)  01/16/18 254 lb (115.2 kg)  01/09/18 255 lb (115.7 kg)   PHYSICAL EXAM: General: Well appearing. No resp difficulty. HEENT: Normal Neck: Supple. JVP 5-6. Carotids 2+ bilat; no bruits. No thyromegaly or nodule noted. Cor: PMI nondisplaced. RRR, No M/G/R noted Lungs: CTAB, normal effort. Abdomen: Soft, non-tender, non-distended, no HSM. No bruits or masses. +BS  Extremities: No cyanosis, clubbing, or rash. R and LLE no edema.  Neuro: Alert & orientedx3, cranial nerves grossly intact. moves all 4 extremities w/o difficulty. Affect pleasant   ASSESSMENT & PLAN:  1. Chronic systolic CHF: - Primarily nonischemic cardiomyopathy. Echo in 2017 with EF 15-20%. Medtronic ICD. Echo 10/2017 EF 15-20%, severely dilated RV with severely decreased systolic function. LHC/RHC 11/06/17 showed ongoing volume overload with 80% RCA stenosis.  Cardiac index low at 1.91. The degree of coronary disease does not explain his cardiomyopathy. Medtronic Optivol: Fluid elevated. Activity ~2-3 hours  No VT.     - NYHA II-III symptoms - Volume status elevated on exam and optivol   - Incease torsemide to 60 mg daily. Would like his weight to be < 250 pounds.  - Decrease entresto 49/51 mg BID.  - Hold off on eplerenone 25 daily. (gynecomastia with  spiro) - Continue hydralazine 10 mg daily, imdur 10 mg daily  - Continue digoxin 0.125 mg daily - Hold off on BB with fatigue and recent low CI for now.   2. COPD: - Never smoked, but apparently had significant occupational exposure. It appears that he won a lawsuit dealing with the occupational exposure-related COPD.  - Continue 2 liters oxygen. Stable.  3. CAD: - LHC 5/20: 80-90% proximal RCA stenosis with DES to RCA.  This did not cause his cardiomyopathy but is a large RCA. - No s/s of ischemia.    - Continue ASA, plavix - On DAPT for 6 months (through 05/05/18) - Continue atorvastatin   4. Polycythemia: - Likely related to chronic hypoxia.  - Has refused sleep study.   5. OSA - Has refused sleep study  6. DM - Continue jardiance - Further per PCP.   Meds as above. RTC 4-6 weeks. Labs today.   Graciella Freer, PA-C 01/18/18   Greater than 50% of the 30 minute visit was spent in counseling/coordination of care regarding disease state education, salt/fluid restriction, sliding scale diuretics, and medication compliance.

## 2018-01-18 ENCOUNTER — Telehealth (HOSPITAL_COMMUNITY): Payer: Self-pay | Admitting: *Deleted

## 2018-01-18 ENCOUNTER — Encounter (HOSPITAL_COMMUNITY): Payer: Self-pay

## 2018-01-18 ENCOUNTER — Ambulatory Visit (HOSPITAL_COMMUNITY)
Admission: RE | Admit: 2018-01-18 | Discharge: 2018-01-18 | Disposition: A | Discharge status: Home / Self Care | Payer: Medicare Other | Source: Ambulatory Visit | Attending: Cardiology | Admitting: Cardiology

## 2018-01-18 VITALS — BP 88/60 | HR 64 | Wt 258.0 lb

## 2018-01-18 DIAGNOSIS — E1140 Type 2 diabetes mellitus with diabetic neuropathy, unspecified: Secondary | ICD-10-CM | POA: Diagnosis not present

## 2018-01-18 DIAGNOSIS — Z87891 Personal history of nicotine dependence: Secondary | ICD-10-CM | POA: Diagnosis not present

## 2018-01-18 DIAGNOSIS — M109 Gout, unspecified: Secondary | ICD-10-CM | POA: Insufficient documentation

## 2018-01-18 DIAGNOSIS — K219 Gastro-esophageal reflux disease without esophagitis: Secondary | ICD-10-CM | POA: Diagnosis not present

## 2018-01-18 DIAGNOSIS — N189 Chronic kidney disease, unspecified: Secondary | ICD-10-CM | POA: Insufficient documentation

## 2018-01-18 DIAGNOSIS — I428 Other cardiomyopathies: Secondary | ICD-10-CM

## 2018-01-18 DIAGNOSIS — E119 Type 2 diabetes mellitus without complications: Secondary | ICD-10-CM

## 2018-01-18 DIAGNOSIS — Z7902 Long term (current) use of antithrombotics/antiplatelets: Secondary | ICD-10-CM | POA: Diagnosis not present

## 2018-01-18 DIAGNOSIS — D751 Secondary polycythemia: Secondary | ICD-10-CM | POA: Diagnosis not present

## 2018-01-18 DIAGNOSIS — I2510 Atherosclerotic heart disease of native coronary artery without angina pectoris: Secondary | ICD-10-CM | POA: Insufficient documentation

## 2018-01-18 DIAGNOSIS — I5022 Chronic systolic (congestive) heart failure: Secondary | ICD-10-CM | POA: Diagnosis not present

## 2018-01-18 DIAGNOSIS — E1122 Type 2 diabetes mellitus with diabetic chronic kidney disease: Secondary | ICD-10-CM | POA: Insufficient documentation

## 2018-01-18 DIAGNOSIS — E7800 Pure hypercholesterolemia, unspecified: Secondary | ICD-10-CM | POA: Insufficient documentation

## 2018-01-18 DIAGNOSIS — Z9981 Dependence on supplemental oxygen: Secondary | ICD-10-CM | POA: Insufficient documentation

## 2018-01-18 DIAGNOSIS — I5042 Chronic combined systolic (congestive) and diastolic (congestive) heart failure: Secondary | ICD-10-CM

## 2018-01-18 DIAGNOSIS — Z885 Allergy status to narcotic agent status: Secondary | ICD-10-CM | POA: Diagnosis not present

## 2018-01-18 DIAGNOSIS — F419 Anxiety disorder, unspecified: Secondary | ICD-10-CM | POA: Diagnosis not present

## 2018-01-18 DIAGNOSIS — G4733 Obstructive sleep apnea (adult) (pediatric): Secondary | ICD-10-CM | POA: Diagnosis not present

## 2018-01-18 DIAGNOSIS — Z79899 Other long term (current) drug therapy: Secondary | ICD-10-CM | POA: Insufficient documentation

## 2018-01-18 DIAGNOSIS — Z794 Long term (current) use of insulin: Secondary | ICD-10-CM | POA: Diagnosis not present

## 2018-01-18 DIAGNOSIS — J449 Chronic obstructive pulmonary disease, unspecified: Secondary | ICD-10-CM | POA: Diagnosis not present

## 2018-01-18 DIAGNOSIS — Z95810 Presence of automatic (implantable) cardiac defibrillator: Secondary | ICD-10-CM | POA: Insufficient documentation

## 2018-01-18 DIAGNOSIS — I130 Hypertensive heart and chronic kidney disease with heart failure and stage 1 through stage 4 chronic kidney disease, or unspecified chronic kidney disease: Secondary | ICD-10-CM | POA: Insufficient documentation

## 2018-01-18 DIAGNOSIS — Z7982 Long term (current) use of aspirin: Secondary | ICD-10-CM | POA: Diagnosis not present

## 2018-01-18 LAB — BASIC METABOLIC PANEL
Anion gap: 11 (ref 5–15)
BUN: 25 mg/dL — AB (ref 6–20)
CO2: 31 mmol/L (ref 22–32)
CREATININE: 1.22 mg/dL (ref 0.61–1.24)
Calcium: 8.3 mg/dL — ABNORMAL LOW (ref 8.9–10.3)
Chloride: 98 mmol/L (ref 98–111)
GFR calc Af Amer: >60 mL/min (ref >60)
GFR calc non Af Amer: >60 mL/min (ref >60)
Glucose, Bld: 254 mg/dL — ABNORMAL HIGH (ref 70–99)
POTASSIUM: 3.4 mmol/L — AB (ref 3.5–5.1)
SODIUM: 140 mmol/L (ref 135–145)

## 2018-01-18 MED ORDER — SACUBITRIL-VALSARTAN 49-51 MG PO TABS: 1.0000 | ORAL_TABLET | Freq: Two times a day (BID) | ORAL | 11 refills | Status: DC

## 2018-01-18 MED ORDER — TORSEMIDE 20 MG PO TABS: 40.0000 mg | ORAL_TABLET | Freq: Every day | ORAL | 11 refills | Status: DC

## 2018-01-18 MED ORDER — POTASSIUM CHLORIDE CRYS ER 20 MEQ PO TBCR: 20.0000 meq | EXTENDED_RELEASE_TABLET | Freq: Every day | ORAL | 3 refills | Status: DC

## 2018-01-18 NOTE — Telephone Encounter (Signed)
Result Notes for Basic metabolic panel   Notes recorded by Georgina Peer, RN on 01/18/2018 at 4:49 PM EDT Called and spoke with patient's family member, she is agreeable with plan. She will have patient's ride call us back to schedule lab appointment. Medication sent to pharmacy and bmet ordered. ------  Notes recorded by Graciella Freer, PA-C on 01/18/2018 at 3:23 PM EDT Please have take 40 meq of K x 1 and then start 20 meq daily. Needs repeat BMET 7-10 days  Casimiro Needle "Mardelle Matte" [place], [place]

## 2018-01-18 NOTE — Patient Instructions (Signed)
STOP Entresto 97-103 mg. You CANNOT half these tablets. Do not throw these away, store them in a cabinet in the event we are able to go back up to this goal dose of medication for you.  START Entresto 49-51 mg tablet twice daily.  INCREASE Torsemide to 40 mg (2 tabs) once daily.  Routine lab work today. Will notify you of abnormal results, otherwise no news is good news!  Return in 2 weeks for repeat labs.  _______________________________________________________________________ [place] Code: 1500  Follow up 4-6 weeks.  ________________________________________________________________________ [place] Code:  Take all medication as prescribed the day of your appointment. Bring all medications with you to your appointment.  Do the following things EVERYDAY: 1) Weigh yourself in the morning before breakfast. Write it down and keep it in a log. 2) Take your medicines as prescribed 3) Eat low salt foods-Limit salt (sodium) to 2000 mg per day.  4) Stay as active as you can everyday 5) Limit all fluids for the day to less than 2 liters

## 2018-01-22 ENCOUNTER — Telehealth (HOSPITAL_COMMUNITY): Payer: Self-pay

## 2018-01-22 NOTE — Telephone Encounter (Signed)
I called Richard [name] to schedule an appointment for tomorrow. He stated he would be home all day and he did not have a time preference. We agreed to meet at 10:00.

## 2018-01-23 ENCOUNTER — Other Ambulatory Visit (HOSPITAL_COMMUNITY): Payer: Self-pay

## 2018-01-23 NOTE — Progress Notes (Signed)
Paramedicine Encounter    Patient ID: Richard [name], male    DOB: [birth year], 57 y.o.   MRN: [id number]   Patient Care Team: Ananias Pilgrim, MD as PCP - General (Family Medicine)  Patient Active Problem List   Diagnosis Date Noted  . S/P right and left heart catheterization 11/02/2017  . Panic attacks 11/02/2017  . Non-STEMI (non-ST elevated myocardial infarction) (HCC)   . 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 Acute on chronic combined systolic and diastolic CHF (congestive heart failure) (HCC)   . COPD (chronic obstructive pulmonary disease) (HCC)   . Hypertension   . CKD (chronic kidney disease)   . 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HCC)   . ICD (implantable cardioverter-defibrillator) in place   . GERD (gastroesophageal reflux disease)   . Diabetes mellitus with diabetic neuropathy, with long-term current use of insulin (HCC)     Current Outpatient Medications:  .  allopurinol (ZYLOPRIM) 100 MG tablet, Take 1 tablet (100 mg total) by mouth daily., Disp: 30 tablet, Rfl: 3 .  aspirin EC 81 MG EC tablet, Take 1 tablet (81 mg total) by mouth daily., Disp: 90 tablet, Rfl: 0 .  atorvastatin (LIPITOR) 40 MG tablet, Take 1 tablet (40 mg total) by mouth daily at 6 PM., Disp: 30 tablet, Rfl: 3 .  BREO ELLIPTA 100-25 MCG/INH AEPB, Inhale 1 puff into the lungs daily., Disp: , Rfl: 5 .  clopidogrel (PLAVIX) 75 MG tablet, Take 1 tablet (75 mg total) by mouth daily with breakfast., Disp: 30 tablet, Rfl: 3 .  digoxin (LANOXIN) 0.125 MG tablet, Take 1 tablet (0.125 mg total) by mouth daily., Disp: 30 tablet, Rfl: 3 .  diphenhydrAMINE (BENADRYL) 25 MG tablet, Take 25 mg by mouth at bedtime as needed., Disp: , Rfl:  .  hydrALAZINE (APRESOLINE) 10 MG tablet, TAKE 1 TABLET(10 MG) BY MOUTH THREE TIMES DAILY, Disp: 270 tablet, Rfl: 0 .  insulin regular human CONCENTRATED (HUMULIN R U-500 KWIKPEN) 500 UNIT/ML kwikpen, USE AS DIRECTED PER SLIDING  SCALE. MAX DAILY DOSE IS 400 UNITS., Disp: , Rfl:  .  isosorbide dinitrate (ISORDIL) 10 MG tablet, Take 1 tablet (10 mg total) by mouth 3 (three) times daily., Disp: 90 tablet, Rfl: 3 .  MAGNESIUM-OXIDE 400 (241.3 Mg) MG tablet, Take 400 mg by mouth daily., Disp: , Rfl: 11 .  omeprazole (PRILOSEC) 20 MG capsule, Take 20 mg by mouth daily., Disp: , Rfl: 1 .  potassium chloride SA (K-DUR,KLOR-CON) 20 MEQ tablet, Take 1 tablet (20 mEq total) by mouth daily., Disp: 90 tablet, Rfl: 3 .  sacubitril-valsartan (ENTRESTO) 49-51 MG, Take 1 tablet by mouth 2 (two) times daily., Disp: 60 tablet, Rfl: 11 .  torsemide (DEMADEX) 20 MG tablet, Take 2 tablets (40 mg total) by mouth daily., Disp: 60 tablet, Rfl: 11 .  Vitamin D, Cholecalciferol, 1000 units CAPS, Take 1,000 Units by mouth daily., Disp: , Rfl: 11 .  DULoxetine (CYMBALTA) 60 MG capsule, Take 60 mg by mouth daily., Disp: , Rfl: 2 .  Insulin Pen Needle (NOVOFINE) 30G X 8 MM MISC, Inject 10 each into the skin as needed. (Patient not taking: Reported on 01/23/2018), Disp: 100 each, Rfl: 0 .  pregabalin (LYRICA) 100 MG capsule, Take 1 capsule (100 mg total) by mouth 2 (two) times daily. (Patient not taking: Reported on 01/23/2018), Disp: 60 capsule, Rfl: 3 Allergies  Allergen Reactions  . Codeine   . Coconut Oil Itching  Social History   Socioeconomic History  . Marital status: Divorced    Spouse name: Not on file  . Number of children: Not on file  . Years of education: Not on file  . Highest education level: Not on file  Occupational History  . Occupation: disability    Comment: stopped working in 2000 d/t occupational exposures  Social Needs  . Financial resource strain: Not on file  . Food insecurity:    Worry: Not on file    Inability: Not on file  . Transportation needs:    Medical: Not on file    Non-medical: Not on file  Tobacco Use  . Smoking status: Former Smoker    Types: Cigarettes  . Smokeless tobacco: Never Used  .  Tobacco comment: "smoked when I drank"  Substance and Sexual Activity  . 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 Drug use: Never  . Sexual activity: Not Currently  Lifestyle  . Physical activity:    Days per week: Not on file    Minutes per session: Not on file  . Stress: Not on file  Relationships  . Social connections:    Talks on phone: Not on file    Gets together: Not on file    Attends religious service: Not on file    Active member of club or organization: Not on file    Attends meetings of clubs or organizations: Not on file    Relationship status: Not on file  . Intimate partner violence:    Fear of current or ex partner: Not on file    Emotionally abused: Not on file    Physically abused: Not on file    Forced sexual activity: Not on file  Other Topics Concern  . Not on file  Social History Narrative   Patient lives at home with wife and some of his 10 children. He self-administers his own medications.    Physical Exam  Constitutional: He is oriented to person, place, and time.  Cardiovascular: Regular rhythm.  Pulmonary/Chest: Effort normal and breath sounds normal.  Abdominal: Soft.  Musculoskeletal: Normal range of motion. He exhibits edema.  Neurological: He is alert and oriented to person, place, and time.  Skin: Skin is warm and dry.  Psychiatric: He has a normal mood and affect.        Future Appointments  Date Time Provider Department Center  01/31/2018  3:45 PM [facility] None  02/01/2018 12:00 PM [facility] None  02/22/2018 11:30 AM [facility] PA/NP [facility] None  03/04/2018  7:15 AM [facility] DEVICE REMOTES [facility]    BP 98/64 (BP Location: Left Arm, Patient Position: Sitting, Cuff Size: Large)   Pulse (!) 44   Resp 16   Wt 256 lb (116.1 kg)   SpO2 98%   BMI 29.58 kg/m   Weight yesterday- 254 lb Last visit weight- 254 lb  Mr Justo was seen at home  today and reported feeling generally well. He stated he has been compliant with medications over the past week. He is currently out of Lyrica and does not have a neurologist to give him a new prescription. I have encouraged him to contact [facility] to be brought in as a new patient but he hasnot reached out to them yet. He had filled his pillbox prior to my arrival however multiple mistakes were made so I made the necessary corrections after verifying his  medications. Nothing further was necessary during the time of my visit.   [name], EMT 01/23/18   ACTION: Home visit completed Next visit planned for 1 week

## 2018-01-30 ENCOUNTER — Telehealth (HOSPITAL_COMMUNITY): Payer: Self-pay

## 2018-01-30 ENCOUNTER — Other Ambulatory Visit (HOSPITAL_COMMUNITY): Payer: Self-pay

## 2018-01-30 NOTE — Progress Notes (Signed)
Paramedicine Encounter    Patient ID: Richard [name], male    DOB: May 29, 1961, 57 y.o.   MRN: [id number]   Patient Care Team: Ananias Pilgrim, MD as PCP - General (Family Medicine)  Patient Active Problem List   Diagnosis Date Noted  . S/P right and left heart catheterization 11/02/2017  . Panic attacks 11/02/2017  . Non-STEMI (non-ST elevated myocardial infarction) (HCC)   . 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 Acute on chronic combined systolic and diastolic CHF (congestive heart failure) (HCC)   . COPD (chronic obstructive pulmonary disease) (HCC)   . Hypertension   . CKD (chronic kidney disease)   . 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HCC)   . ICD (implantable cardioverter-defibrillator) in place   . GERD (gastroesophageal reflux disease)   . Diabetes mellitus with diabetic neuropathy, with long-term current use of insulin (HCC)     Current Outpatient Medications:  .  allopurinol (ZYLOPRIM) 100 MG tablet, Take 1 tablet (100 mg total) by mouth daily., Disp: 30 tablet, Rfl: 3 .  aspirin EC 81 MG EC tablet, Take 1 tablet (81 mg total) by mouth daily., Disp: 90 tablet, Rfl: 0 .  atorvastatin (LIPITOR) 40 MG tablet, Take 1 tablet (40 mg total) by mouth daily at 6 PM., Disp: 30 tablet, Rfl: 3 .  BREO ELLIPTA 100-25 MCG/INH AEPB, Inhale 1 puff into the lungs daily., Disp: , Rfl: 5 .  clopidogrel (PLAVIX) 75 MG tablet, Take 1 tablet (75 mg total) by mouth daily with breakfast., Disp: 30 tablet, Rfl: 3 .  digoxin (LANOXIN) 0.125 MG tablet, Take 1 tablet (0.125 mg total) by mouth daily., Disp: 30 tablet, Rfl: 3 .  diphenhydrAMINE (BENADRYL) 25 MG tablet, Take 25 mg by mouth at bedtime as needed., Disp: , Rfl:  .  DULoxetine (CYMBALTA) 60 MG capsule, Take 60 mg by mouth daily., Disp: , Rfl: 2 .  hydrALAZINE (APRESOLINE) 10 MG tablet, TAKE 1 TABLET(10 MG) BY MOUTH THREE TIMES DAILY, Disp: 270 tablet, Rfl: 0 .  insulin regular human  CONCENTRATED (HUMULIN R U-500 KWIKPEN) 500 UNIT/ML kwikpen, USE AS DIRECTED PER SLIDING SCALE. MAX DAILY DOSE IS 400 UNITS., Disp: , Rfl:  .  isosorbide dinitrate (ISORDIL) 10 MG tablet, Take 1 tablet (10 mg total) by mouth 3 (three) times daily., Disp: 90 tablet, Rfl: 3 .  MAGNESIUM-OXIDE 400 (241.3 Mg) MG tablet, Take 400 mg by mouth daily., Disp: , Rfl: 11 .  omeprazole (PRILOSEC) 20 MG capsule, Take 20 mg by mouth daily., Disp: , Rfl: 1 .  potassium chloride SA (K-DUR,KLOR-CON) 20 MEQ tablet, Take 1 tablet (20 mEq total) by mouth daily., Disp: 90 tablet, Rfl: 3 .  pregabalin (LYRICA) 100 MG capsule, Take 1 capsule (100 mg total) by mouth 2 (two) times daily., Disp: 60 capsule, Rfl: 3 .  sacubitril-valsartan (ENTRESTO) 49-51 MG, Take 1 tablet by mouth 2 (two) times daily., Disp: 60 tablet, Rfl: 11 .  torsemide (DEMADEX) 20 MG tablet, Take 2 tablets (40 mg total) by mouth daily., Disp: 60 tablet, Rfl: 11 .  Insulin Pen Needle (NOVOFINE) 30G X 8 MM MISC, Inject 10 each into the skin as needed. (Patient not taking: Reported on 01/23/2018), Disp: 100 each, Rfl: 0 .  Vitamin D, Cholecalciferol, 1000 units CAPS, Take 1,000 Units by mouth daily., Disp: , Rfl: 11 Allergies  Allergen Reactions  . Codeine   . Coconut Oil Itching      Social History  Socioeconomic History  . Marital status: Divorced    Spouse name: Not on file  . Number of children: Not on file  . Years of education: Not on file  . Highest education level: Not on file  Occupational History  . Occupation: disability    Comment: stopped working in 2000 d/t occupational exposures  Social Needs  . Financial resource strain: Not on file  . Food insecurity:    Worry: Not on file    Inability: Not on file  . Transportation needs:    Medical: Not on file    Non-medical: Not on file  Tobacco Use  . Smoking status: Former Smoker    Types: Cigarettes  . Smokeless tobacco: Never Used  . Tobacco comment: "smoked when I drank"   Substance and Sexual Activity  . 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 Drug use: Never  . Sexual activity: Not Currently  Lifestyle  . Physical activity:    Days per week: Not on file    Minutes per session: Not on file  . Stress: Not on file  Relationships  . Social connections:    Talks on phone: Not on file    Gets together: Not on file    Attends religious service: Not on file    Active member of club or organization: Not on file    Attends meetings of clubs or organizations: Not on file    Relationship status: Not on file  . Intimate partner violence:    Fear of current or ex partner: Not on file    Emotionally abused: Not on file    Physically abused: Not on file    Forced sexual activity: Not on file  Other Topics Concern  . Not on file  Social History Narrative   Patient lives at home with wife and some of his 10 children. He self-administers his own medications.    Physical Exam  Constitutional: He is oriented to person, place, and time.  Cardiovascular: Normal rate and regular rhythm.  Pulmonary/Chest: Effort normal and breath sounds normal.  Abdominal: Soft.  Musculoskeletal: Normal range of motion. He exhibits edema.  Neurological: He is alert and oriented to person, place, and time.  Skin: Skin is warm and dry.  Psychiatric: He has a normal mood and affect.        Future Appointments  Date Time Provider Department Center  01/31/2018  3:45 PM [facility] None  02/01/2018 12:00 PM [facility] None  02/22/2018 11:30 AM [facility] PA/NP [facility] None  03/04/2018  7:15 AM [facility] DEVICE REMOTES [facility]    BP 122/80 (BP Location: Left Arm, Patient Position: Sitting, Cuff Size: Large)   Pulse 78   Resp 16   Wt 254 lb (115.2 kg)   SpO2 93%   BMI 29.35 kg/m   Weight yesterday- 259 lb Last visit weight- 256 lb  Richard [name] was seen at home today and reported feeling  generally well. He denied SOB, headache, dizziness or orthopnea. He has been compliant with his medications over the past week but has not picked up vitamin D prior to my arrival. He did say it was ready to be picked up from the pharmacy and felt comfortable placing it in his pillbox upon picking it up. His medications were verified and his pillbox was refilled. I added a second pillbox today since I will be gone next week. I let him  know to call the clinic if he finds himself in need of assistance and they will send one of the other CHP's out to him. He was understanding and agreeable.   [name], EMT 01/30/18  ACTION: Home visit completed Next visit planned for 2 weeks

## 2018-01-30 NOTE — Telephone Encounter (Signed)
I called Richard [name] to see if he was home and available to meet this afternoon. He did not answer so I left a voicemail requesting a call back.

## 2018-01-31 ENCOUNTER — Other Ambulatory Visit (HOSPITAL_COMMUNITY): Payer: Medicare Other

## 2018-02-01 ENCOUNTER — Ambulatory Visit (HOSPITAL_COMMUNITY)
Admission: RE | Admit: 2018-02-01 | Discharge: 2018-02-01 | Disposition: A | Discharge status: Home / Self Care | Payer: Medicare Other | Source: Ambulatory Visit | Attending: Cardiology | Admitting: Cardiology

## 2018-02-01 DIAGNOSIS — I5042 Chronic combined systolic (congestive) and diastolic (congestive) heart failure: Secondary | ICD-10-CM | POA: Diagnosis present

## 2018-02-01 LAB — BASIC METABOLIC PANEL
ANION GAP: 9 (ref 5–15)
BUN: 17 mg/dL (ref 6–20)
CO2: 34 mmol/L — AB (ref 22–32)
CREATININE: 1.26 mg/dL — AB (ref 0.61–1.24)
Calcium: 8.6 mg/dL — ABNORMAL LOW (ref 8.9–10.3)
Chloride: 96 mmol/L — ABNORMAL LOW (ref 98–111)
GFR calc non Af Amer: >60 mL/min (ref >60)
GLUCOSE: 251 mg/dL — AB (ref 70–99)
Potassium: 4.1 mmol/L (ref 3.5–5.1)
Sodium: 139 mmol/L (ref 135–145)

## 2018-02-11 ENCOUNTER — Other Ambulatory Visit: Payer: Self-pay | Admitting: Student

## 2018-02-15 ENCOUNTER — Telehealth (HOSPITAL_COMMUNITY): Payer: Self-pay

## 2018-02-15 ENCOUNTER — Other Ambulatory Visit (HOSPITAL_COMMUNITY): Payer: Self-pay

## 2018-02-15 NOTE — Telephone Encounter (Signed)
[facility] scientific officer) called to report pt's bp was 84/60. Pt experiencing diarrhea and not eating. Thayer Ohm stated that pt was not feeling well and should pt's hold off on meds.  Per Amy, pt is to hold torsemide for 2 days and come in for a Bmet next week.

## 2018-02-15 NOTE — Telephone Encounter (Signed)
I spoke with Richard [name] who advised he would be home all day and we could come by when we were able. We agreed and decided to meet with him at 10:00.

## 2018-02-15 NOTE — Progress Notes (Signed)
Paramedicine Encounter    Patient ID: Richard [name], male    DOB: Apr 25, 1961, 57 y.o.   MRN: [id number]   Patient Care Team: Ananias Pilgrim, MD as PCP - General (Family Medicine)  Patient Active Problem List   Diagnosis Date Noted  . S/P right and left heart catheterization 11/02/2017  . Panic attacks 11/02/2017  . Non-STEMI (non-ST elevated myocardial infarction) (HCC)   . 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 Acute on chronic combined systolic and diastolic CHF (congestive heart failure) (HCC)   . COPD (chronic obstructive pulmonary disease) (HCC)   . Hypertension   . CKD (chronic kidney disease)   . 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HCC)   . ICD (implantable cardioverter-defibrillator) in place   . GERD (gastroesophageal reflux disease)   . Diabetes mellitus with diabetic neuropathy, with long-term current use of insulin (HCC)     Current Outpatient Medications:  .  allopurinol (ZYLOPRIM) 100 MG tablet, Take 1 tablet (100 mg total) by mouth daily., Disp: 30 tablet, Rfl: 3 .  atorvastatin (LIPITOR) 40 MG tablet, Take 1 tablet (40 mg total) by mouth daily at 6 PM., Disp: 30 tablet, Rfl: 3 .  BREO ELLIPTA 100-25 MCG/INH AEPB, Inhale 1 puff into the lungs daily., Disp: , Rfl: 5 .  clopidogrel (PLAVIX) 75 MG tablet, Take 1 tablet (75 mg total) by mouth daily with breakfast., Disp: 30 tablet, Rfl: 3 .  digoxin (LANOXIN) 0.125 MG tablet, Take 1 tablet (0.125 mg total) by mouth daily., Disp: 30 tablet, Rfl: 3 .  hydrALAZINE (APRESOLINE) 10 MG tablet, TAKE 1 TABLET(10 MG) BY MOUTH THREE TIMES DAILY, Disp: 270 tablet, Rfl: 0 .  insulin regular human CONCENTRATED (HUMULIN R U-500 KWIKPEN) 500 UNIT/ML kwikpen, USE AS DIRECTED PER SLIDING SCALE. MAX DAILY DOSE IS 400 UNITS., Disp: , Rfl:  .  isosorbide dinitrate (ISORDIL) 10 MG tablet, Take 1 tablet (10 mg total) by mouth 3 (three) times daily., Disp: 90 tablet, Rfl: 3 .  MAGNESIUM-OXIDE  400 (241.3 Mg) MG tablet, Take 400 mg by mouth daily., Disp: , Rfl: 11 .  omeprazole (PRILOSEC) 20 MG capsule, Take 20 mg by mouth daily., Disp: , Rfl: 1 .  potassium chloride SA (K-DUR,KLOR-CON) 20 MEQ tablet, Take 1 tablet (20 mEq total) by mouth daily., Disp: 90 tablet, Rfl: 3 .  pregabalin (LYRICA) 100 MG capsule, Take 1 capsule (100 mg total) by mouth 2 (two) times daily., Disp: 60 capsule, Rfl: 3 .  sacubitril-valsartan (ENTRESTO) 49-51 MG, Take 1 tablet by mouth 2 (two) times daily., Disp: 60 tablet, Rfl: 11 .  torsemide (DEMADEX) 20 MG tablet, Take 2 tablets (40 mg total) by mouth daily., Disp: 60 tablet, Rfl: 11 .  aspirin EC 81 MG EC tablet, Take 1 tablet (81 mg total) by mouth daily. (Patient not taking: Reported on 02/15/2018), Disp: 90 tablet, Rfl: 0 .  diphenhydrAMINE (BENADRYL) 25 MG tablet, Take 25 mg by mouth at bedtime as needed., Disp: , Rfl:  .  DULoxetine (CYMBALTA) 60 MG capsule, Take 60 mg by mouth daily., Disp: , Rfl: 2 .  Insulin Pen Needle (NOVOFINE) 30G X 8 MM MISC, Inject 10 each into the skin as needed. (Patient not taking: Reported on 01/23/2018), Disp: 100 each, Rfl: 0 .  Vitamin D, Cholecalciferol, 1000 units CAPS, Take 1,000 Units by mouth daily., Disp: , Rfl: 11 Allergies  Allergen Reactions  . Codeine   . Coconut Oil Itching  Social History   Socioeconomic History  . Marital status: Divorced    Spouse name: Not on file  . Number of children: Not on file  . Years of education: Not on file  . Highest education level: Not on file  Occupational History  . Occupation: disability    Comment: stopped working in 2000 d/t occupational exposures  Social Needs  . Financial resource strain: Not on file  . Food insecurity:    Worry: Not on file    Inability: Not on file  . Transportation needs:    Medical: Not on file    Non-medical: Not on file  Tobacco Use  . Smoking status: Former Smoker    Types: Cigarettes  . Smokeless tobacco: Never Used  .  Tobacco comment: "smoked when I drank"  Substance and Sexual Activity  . 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 Drug use: Never  . Sexual activity: Not Currently  Lifestyle  . Physical activity:    Days per week: Not on file    Minutes per session: Not on file  . Stress: Not on file  Relationships  . Social connections:    Talks on phone: Not on file    Gets together: Not on file    Attends religious service: Not on file    Active member of club or organization: Not on file    Attends meetings of clubs or organizations: Not on file    Relationship status: Not on file  . Intimate partner violence:    Fear of current or ex partner: Not on file    Emotionally abused: Not on file    Physically abused: Not on file    Forced sexual activity: Not on file  Other Topics Concern  . Not on file  Social History Narrative   Patient lives at home with wife and some of his 10 children. He self-administers his own medications.    Physical Exam  Constitutional: He is oriented to person, place, and time.  Cardiovascular: Normal rate and regular rhythm.  Pulmonary/Chest: Effort normal and breath sounds normal.  Abdominal: Soft.  Musculoskeletal: Normal range of motion. He exhibits no edema.  Neurological: He is alert and oriented to person, place, and time.  Skin: Skin is warm and dry.  Psychiatric: He has a normal mood and affect.        Future Appointments  Date Time Provider Department Center  02/22/2018 11:30 AM [facility] PA/NP [facility] None  03/04/2018  7:15 AM [facility] DEVICE REMOTES [facility]    BP (!) 84/60 (BP Location: Left Arm, Patient Position: Sitting, Cuff Size: Large)   Pulse 80   Resp 16   Wt 248 lb (112.5 kg)   SpO2 98%   BMI 28.66 kg/m   Weight yesterday- 249 lb Last visit weight- 254 lb  Mr Winterbottom was seen at home today and reported feeling unwell. He reported nausea and diarrhea for the  past three days. He stated he has not taken all of his medications since he started feeling bad. His blood pressure was running low but he advised that he has not been able eat or drink much since feeling unwell. HI contacted the clinic who advised to hold diuretics for the next two days and make sure he keeps his appointment next week. His medications were verified and his pillbox was refilled with the necessary changes. He will need Hydralazine in week 2 box  on Wednesday afternoon through Saturday Evening and Entresto in week 2 box Monday evening through Saturday evening. I made him aware of this and he sad he would be able to pick up the medications and add them without difficulty. I will follow up next week at his clinic appointment.  [name], EMT 02/15/18  ACTION: Home visit completed Next visit planned for 2 weeks

## 2018-02-20 ENCOUNTER — Other Ambulatory Visit (HOSPITAL_COMMUNITY): Payer: Self-pay

## 2018-02-20 ENCOUNTER — Telehealth (HOSPITAL_COMMUNITY): Payer: Self-pay

## 2018-02-20 NOTE — Progress Notes (Signed)
Paramedicine Encounter    Patient ID: Richard [name], male    DOB: [birth year], 57 y.o.   MRN: [id number]   Patient Care Team: Richard Pilgrim, MD as PCP - General (Family Medicine)  Patient Active Problem List   Diagnosis Date Noted  . S/P right and left heart catheterization 11/02/2017  . Panic attacks 11/02/2017  . Non-STEMI (non-ST elevated myocardial infarction) (HCC)   . 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 Acute on chronic combined systolic and diastolic CHF (congestive heart failure) (HCC)   . COPD (chronic obstructive pulmonary disease) (HCC)   . Hypertension   . CKD (chronic kidney disease)   . 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HCC)   . ICD (implantable cardioverter-defibrillator) in place   . GERD (gastroesophageal reflux disease)   . Diabetes mellitus with diabetic neuropathy, with long-term current use of insulin (HCC)     Current Outpatient Medications:  .  allopurinol (ZYLOPRIM) 100 MG tablet, Take 1 tablet (100 mg total) by mouth daily., Disp: 30 tablet, Rfl: 3 .  aspirin EC 81 MG EC tablet, Take 1 tablet (81 mg total) by mouth daily. (Patient not taking: Reported on 02/15/2018), Disp: 90 tablet, Rfl: 0 .  atorvastatin (LIPITOR) 40 MG tablet, Take 1 tablet (40 mg total) by mouth daily at 6 PM., Disp: 30 tablet, Rfl: 3 .  BREO ELLIPTA 100-25 MCG/INH AEPB, Inhale 1 puff into the lungs daily., Disp: , Rfl: 5 .  clopidogrel (PLAVIX) 75 MG tablet, Take 1 tablet (75 mg total) by mouth daily with breakfast., Disp: 30 tablet, Rfl: 3 .  digoxin (LANOXIN) 0.125 MG tablet, Take 1 tablet (0.125 mg total) by mouth daily., Disp: 30 tablet, Rfl: 3 .  diphenhydrAMINE (BENADRYL) 25 MG tablet, Take 25 mg by mouth at bedtime as needed., Disp: , Rfl:  .  DULoxetine (CYMBALTA) 60 MG capsule, Take 60 mg by mouth daily., Disp: , Rfl: 2 .  hydrALAZINE (APRESOLINE) 10 MG tablet, TAKE 1 TABLET(10 MG) BY MOUTH THREE TIMES DAILY, Disp: 270  tablet, Rfl: 0 .  Insulin Pen Needle (NOVOFINE) 30G X 8 MM MISC, Inject 10 each into the skin as needed. (Patient not taking: Reported on 01/23/2018), Disp: 100 each, Rfl: 0 .  insulin regular human CONCENTRATED (HUMULIN R U-500 KWIKPEN) 500 UNIT/ML kwikpen, USE AS DIRECTED PER SLIDING SCALE. MAX DAILY DOSE IS 400 UNITS., Disp: , Rfl:  .  isosorbide dinitrate (ISORDIL) 10 MG tablet, Take 1 tablet (10 mg total) by mouth 3 (three) times daily., Disp: 90 tablet, Rfl: 3 .  MAGNESIUM-OXIDE 400 (241.3 Mg) MG tablet, Take 400 mg by mouth daily., Disp: , Rfl: 11 .  omeprazole (PRILOSEC) 20 MG capsule, Take 20 mg by mouth daily., Disp: , Rfl: 1 .  potassium chloride SA (K-DUR,KLOR-CON) 20 MEQ tablet, Take 1 tablet (20 mEq total) by mouth daily., Disp: 90 tablet, Rfl: 3 .  pregabalin (LYRICA) 100 MG capsule, Take 1 capsule (100 mg total) by mouth 2 (two) times daily., Disp: 60 capsule, Rfl: 3 .  sacubitril-valsartan (ENTRESTO) 49-51 MG, Take 1 tablet by mouth 2 (two) times daily., Disp: 60 tablet, Rfl: 11 .  torsemide (DEMADEX) 20 MG tablet, Take 2 tablets (40 mg total) by mouth daily., Disp: 60 tablet, Rfl: 11 .  Vitamin D, Cholecalciferol, 1000 units CAPS, Take 1,000 Units by mouth daily., Disp: , Rfl: 11 Allergies  Allergen Reactions  . Codeine   . Coconut Oil Itching  Social History   Socioeconomic History  . Marital status: Divorced    Spouse name: Not on file  . Number of children: Not on file  . Years of education: Not on file  . Highest education level: Not on file  Occupational History  . Occupation: disability    Comment: stopped working in 2000 d/t occupational exposures  Social Needs  . Financial resource strain: Not on file  . Food insecurity:    Worry: Not on file    Inability: Not on file  . Transportation needs:    Medical: Not on file    Non-medical: Not on file  Tobacco Use  . Smoking status: Former Smoker    Types: Cigarettes  . Smokeless tobacco: Never Used  .  Tobacco comment: "smoked when I drank"  Substance and Sexual Activity  . 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 Drug use: Never  . Sexual activity: Not Currently  Lifestyle  . Physical activity:    Days per week: Not on file    Minutes per session: Not on file  . Stress: Not on file  Relationships  . Social connections:    Talks on phone: Not on file    Gets together: Not on file    Attends religious service: Not on file    Active member of club or organization: Not on file    Attends meetings of clubs or organizations: Not on file    Relationship status: Not on file  . Intimate partner violence:    Fear of current or ex partner: Not on file    Emotionally abused: Not on file    Physically abused: Not on file    Forced sexual activity: Not on file  Other Topics Concern  . Not on file  Social History Narrative   Patient lives at home with wife and some of his 10 children. He self-administers his own medications.    Physical Exam  Constitutional: He is oriented to person, place, and time.  Cardiovascular: Regular rhythm.  Pulmonary/Chest: Effort normal and breath sounds normal.  Abdominal: He exhibits distension.  Musculoskeletal: Normal range of motion. He exhibits edema.  Neurological: He is alert and oriented to person, place, and time.  Skin: Skin is warm and dry.  Psychiatric: He has a normal mood and affect.        Future Appointments  Date Time Provider Department Center  02/22/2018 11:30 AM [facility] PA/NP [facility] None  03/04/2018  7:15 AM [facility] DEVICE REMOTES [facility]    BP 100/70 (BP Location: Left Arm, Patient Position: Sitting, Cuff Size: Large)   Pulse (!) 50   Resp 16   Wt 253 lb (114.8 kg)   SpO2 93%   BMI 29.24 kg/m   Weight yesterday- 249 lb Last visit weight- 248 lb  Mr Richard [name] was seen at home today and reported feeling poor. He stated he has had GI distress for the  past several days which has prevented him from taking any of his medications until he took them today. He stated he has been constipated despite using an OTC stool softener. His abdomen was distended which he said was making it hard to take a deep breath. I advised that he begin taking his medications regularly again and keep his appointment on Friday. I also advised he bring his pillboxes and medications with him so I can make any necessary changes. I will follow up  at the appointment.   [name], EMT 02/20/18  ACTION: Home visit completed Next visit planned for Friday

## 2018-02-20 NOTE — Telephone Encounter (Signed)
I called Richard [name] to schedule an appointment for today. He advise he would be home all day so we agreed to meet at 14:00.

## 2018-02-22 ENCOUNTER — Ambulatory Visit (HOSPITAL_COMMUNITY)
Admission: RE | Admit: 2018-02-22 | Discharge: 2018-02-22 | Disposition: A | Discharge status: Home / Self Care | Payer: Medicare Other | Source: Ambulatory Visit | Attending: Internal Medicine | Admitting: Internal Medicine

## 2018-02-22 ENCOUNTER — Other Ambulatory Visit (HOSPITAL_COMMUNITY): Payer: Self-pay

## 2018-02-22 ENCOUNTER — Encounter (HOSPITAL_COMMUNITY): Payer: Self-pay

## 2018-02-22 VITALS — BP 118/80 | HR 86 | Wt 257.0 lb

## 2018-02-22 DIAGNOSIS — Z7902 Long term (current) use of antithrombotics/antiplatelets: Secondary | ICD-10-CM | POA: Diagnosis not present

## 2018-02-22 DIAGNOSIS — I5042 Chronic combined systolic (congestive) and diastolic (congestive) heart failure: Secondary | ICD-10-CM

## 2018-02-22 DIAGNOSIS — I5022 Chronic systolic (congestive) heart failure: Secondary | ICD-10-CM | POA: Diagnosis present

## 2018-02-22 DIAGNOSIS — Z9981 Dependence on supplemental oxygen: Secondary | ICD-10-CM | POA: Diagnosis not present

## 2018-02-22 DIAGNOSIS — Z7982 Long term (current) use of aspirin: Secondary | ICD-10-CM | POA: Insufficient documentation

## 2018-02-22 DIAGNOSIS — Z87891 Personal history of nicotine dependence: Secondary | ICD-10-CM | POA: Diagnosis not present

## 2018-02-22 DIAGNOSIS — E1122 Type 2 diabetes mellitus with diabetic chronic kidney disease: Secondary | ICD-10-CM | POA: Insufficient documentation

## 2018-02-22 DIAGNOSIS — M109 Gout, unspecified: Secondary | ICD-10-CM | POA: Insufficient documentation

## 2018-02-22 DIAGNOSIS — J449 Chronic obstructive pulmonary disease, unspecified: Secondary | ICD-10-CM | POA: Insufficient documentation

## 2018-02-22 DIAGNOSIS — I2510 Atherosclerotic heart disease of native coronary artery without angina pectoris: Secondary | ICD-10-CM | POA: Diagnosis not present

## 2018-02-22 DIAGNOSIS — I130 Hypertensive heart and chronic kidney disease with heart failure and stage 1 through stage 4 chronic kidney disease, or unspecified chronic kidney disease: Secondary | ICD-10-CM | POA: Diagnosis not present

## 2018-02-22 DIAGNOSIS — K219 Gastro-esophageal reflux disease without esophagitis: Secondary | ICD-10-CM | POA: Insufficient documentation

## 2018-02-22 DIAGNOSIS — E7800 Pure hypercholesterolemia, unspecified: Secondary | ICD-10-CM | POA: Insufficient documentation

## 2018-02-22 DIAGNOSIS — Z91018 Allergy to other foods: Secondary | ICD-10-CM | POA: Insufficient documentation

## 2018-02-22 DIAGNOSIS — Z8249 Family history of ischemic heart disease and other diseases of the circulatory system: Secondary | ICD-10-CM | POA: Insufficient documentation

## 2018-02-22 DIAGNOSIS — D751 Secondary polycythemia: Secondary | ICD-10-CM | POA: Diagnosis not present

## 2018-02-22 DIAGNOSIS — Z95810 Presence of automatic (implantable) cardiac defibrillator: Secondary | ICD-10-CM

## 2018-02-22 DIAGNOSIS — G4733 Obstructive sleep apnea (adult) (pediatric): Secondary | ICD-10-CM | POA: Insufficient documentation

## 2018-02-22 DIAGNOSIS — E119 Type 2 diabetes mellitus without complications: Secondary | ICD-10-CM

## 2018-02-22 DIAGNOSIS — Z833 Family history of diabetes mellitus: Secondary | ICD-10-CM | POA: Diagnosis not present

## 2018-02-22 DIAGNOSIS — Z79899 Other long term (current) drug therapy: Secondary | ICD-10-CM | POA: Insufficient documentation

## 2018-02-22 DIAGNOSIS — I428 Other cardiomyopathies: Secondary | ICD-10-CM

## 2018-02-22 DIAGNOSIS — Z885 Allergy status to narcotic agent status: Secondary | ICD-10-CM | POA: Insufficient documentation

## 2018-02-22 DIAGNOSIS — Z794 Long term (current) use of insulin: Secondary | ICD-10-CM | POA: Diagnosis not present

## 2018-02-22 NOTE — Patient Instructions (Signed)
Follow up in 3 weeks as scheduled.

## 2018-02-22 NOTE — Progress Notes (Signed)
Paramedicine Encounter   Patient ID: Richard [name] , male,   DOB: 29-Apr-1961,56 y.o.,  MRN: [id number]  Richard [name] was seen in the clinic today and reported feeling well. He said he has been back on his medications since I saw him on Wednesday. He is still complaining of constipation despite using MiraLAX. Mardelle Matte advised to try dulcolax OTC to help with his constipation but otherwise did not make any changes.    [name], EMT 02/22/2018   ACTION: Next visit planned for 1 week

## 2018-02-22 NOTE — Progress Notes (Signed)
Advanced Heart Failure Clinic Note   PCP: Ananias Pilgrim, MD PCP-Cardiologist: Dr Shirlee Latch  HPI: Richard [name] is a 57 y.o. male with a history of chronic systolic HF s/p Medtronic ICD (12/2016), HTN, DM, and HLD.   Admitted 5/8-5/14/19 with A/C systolic HF. Echo showed EF 15-20%. Advanced HF team was consulted. He diuresed 20 lbs with IV lasix, then transitioned to torsemide 40 mg daily. Underwent R/LHC. Results below. HF meds were optimized. Betablocker was not started due to low CI and soft BPs. He was discharged on home oxygen. Referred to cardiac rehab. DC weight: 260 lbs.   He presents today for regular visit. Last visit Entresto decreased and torsemide increased. Seen by paramedicine earlier this week and had stopped all of his medicine. He states he was started on a new inhaler and felt sick to his stomach, so stopped all of his medicines for several days. States he is now back on all of them and taking as directed. Drinking >2L a day. Encouraged fluid and salt restriction. He is only using his O2 at home intermittently, and not traveling with it. Pulse ox 86% on arrival.   Echo 11/01/17: EF 15-20%, grade 2 DD, mild MR, RV severely reduced, LA moderately dilated, RA mildly dilated, midl TR,   LHC/RHC (5/10):  Coronary Findings  Diagnostic  Dominance: Right  Left Main  No significant disease.  Left Anterior Descending  80% distal LAD stenosis.  Ramus Intermedius  Moderate ramus with luminal irregularities.  Left Circumflex  Luminal irregularities.  Right Coronary Artery  Focal 80% stenosis proximal RCA.  Intervention  Successful direct stent PCI on the proximal RCA with 85% stenosis and TIMI grade III flow reduced to 0% with TIMI Grade 3 Flow. Right Heart   Right Heart Pressures RHC Procedural Findings: Hemodynamics (mmHg)  RA 13 RV 67/18 PA 69/31, mean 47 PCWP mean 24 LV 128/33 AO 117/85  Oxygen saturations: PA 66% AO 98%  Cardiac Output (Fick) 4.8  Cardiac Index  (Fick) 1.91 PVR 4.8 WU   Review of systems complete and found to be negative unless listed in HPI.    Past Medical History:  Diagnosis Date  . AICD (automatic cardioverter/defibrillator) present 01/11/2017  . Anxiety   . CKD (chronic kidney disease)   . COPD (chronic obstructive pulmonary disease) (HCC)    never smoker, industrial exposure  . Diabetes mellitus with diabetic neuropathy, with long-term current use of insulin (HCC)   . GERD (gastroesophageal reflux disease)   . High cholesterol   . History of gout    "take RX qd" (11/01/2017)  . Hypertension   . Nonobstructive atherosclerosis of coronary artery   . On home oxygen therapy    "2L prn" (11/01/2017)  . 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HCC)    LLL, present for years  . Systolic HF (heart failure) (HCC)    Current Outpatient Medications  Medication Sig Dispense Refill  . allopurinol (ZYLOPRIM) 100 MG tablet Take 1 tablet (100 mg total) by mouth daily. 30 tablet 3  . aspirin EC 81 MG EC tablet Take 1 tablet (81 mg total) by mouth daily. 90 tablet 0  . atorvastatin (LIPITOR) 40 MG tablet Take 1 tablet (40 mg total) by mouth daily at 6 PM. 30 tablet 3  . BREO ELLIPTA 100-25 MCG/INH AEPB Inhale 1 puff into the lungs daily.  5  . clopidogrel (PLAVIX) 75 MG tablet Take 1 tablet (75 mg total) by mouth daily with breakfast. 30 tablet 3  . digoxin (LANOXIN)  0.125 MG tablet Take 1 tablet (0.125 mg total) by mouth daily. 30 tablet 3  . diphenhydrAMINE (BENADRYL) 25 MG tablet Take 25 mg by mouth at bedtime as needed.    . DULoxetine (CYMBALTA) 60 MG capsule Take 60 mg by mouth daily.  2  . hydrALAZINE (APRESOLINE) 10 MG tablet TAKE 1 TABLET(10 MG) BY MOUTH THREE TIMES DAILY 270 tablet 0  . Insulin Pen Needle (NOVOFINE) 30G X 8 MM MISC Inject 10 each into the skin as needed. 100 each 0  . insulin regular human CONCENTRATED (HUMULIN R U-500 KWIKPEN) 500 UNIT/ML kwikpen USE AS DIRECTED PER SLIDING SCALE. MAX DAILY DOSE IS 400 UNITS.    Marland Kitchen  isosorbide dinitrate (ISORDIL) 10 MG tablet Take 1 tablet (10 mg total) by mouth 3 (three) times daily. 90 tablet 3  . MAGNESIUM-OXIDE 400 (241.3 Mg) MG tablet Take 400 mg by mouth daily.  11  . omeprazole (PRILOSEC) 20 MG capsule Take 20 mg by mouth daily.  1  . potassium chloride SA (K-DUR,KLOR-CON) 20 MEQ tablet Take 1 tablet (20 mEq total) by mouth daily. 90 tablet 3  . pregabalin (LYRICA) 100 MG capsule Take 1 capsule (100 mg total) by mouth 2 (two) times daily. 60 capsule 3  . sacubitril-valsartan (ENTRESTO) 49-51 MG Take 1 tablet by mouth 2 (two) times daily. 60 tablet 11  . torsemide (DEMADEX) 20 MG tablet Take 2 tablets (40 mg total) by mouth daily. 60 tablet 11  . Vitamin D, Cholecalciferol, 1000 units CAPS Take 1,000 Units by mouth daily.  11   No current facility-administered medications for this encounter.    Allergies  Allergen Reactions  . Codeine   . Coconut Oil Itching    Social History   Socioeconomic History  . Marital status: Divorced    Spouse name: Not on file  . Number of children: Not on file  . Years of education: Not on file  . Highest education level: Not on file  Occupational History  . Occupation: disability    Comment: stopped working in 2000 d/t occupational exposures  Social Needs  . Financial resource strain: Not on file  . Food insecurity:    Worry: Not on file    Inability: Not on file  . Transportation needs:    Medical: Not on file    Non-medical: Not on file  Tobacco Use  . Smoking status: Former Smoker    Types: Cigarettes  . Smokeless tobacco: Never Used  . Tobacco comment: "smoked when I drank"  Substance and Sexual Activity  . 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 Drug use: Never  . Sexual activity: Not Currently  Lifestyle  . Physical activity:    Days per week: Not on file    Minutes per session: Not on file  . Stress: Not on file  Relationships  . Social  connections:    Talks on phone: Not on file    Gets together: Not on file    Attends religious service: Not on file    Active member of club or organization: Not on file    Attends meetings of clubs or organizations: Not on file    Relationship status: Not on file  . Intimate partner violence:    Fear of current or ex partner: Not on file    Emotionally abused: Not on file    Physically abused: Not on file    Forced sexual  activity: Not on file  Other Topics Concern  . Not on file  Social History Narrative   Patient lives at home with wife and some of his 10 children. He self-administers his own medications.   Family History  Problem Relation Age of Onset  . Hypertension Mother   . Diabetes Mother   . Hypertension Father   . Diabetes Father   . Diabetes Sister   . Diabetes Brother    Vitals:   02/22/18 1137  BP: 118/80  Pulse: 86  SpO2: 91%  Weight: 116.6 kg (257 lb)     Wt Readings from Last 3 Encounters:  02/22/18 116.6 kg (257 lb)  02/20/18 114.8 kg (253 lb)  02/15/18 112.5 kg (248 lb)   PHYSICAL EXAM: General: Well appearing. No resp difficulty. HEENT: Normal Neck: Supple. JVP 5-6. Carotids 2+ bilat; no bruits. No thyromegaly or nodule noted. Cor: PMI nondisplaced. RRR, No M/G/R noted Lungs: CTAB, normal effort. Abdomen: Soft, non-tender, non-distended, no HSM. No bruits or masses. +BS  Extremities: No cyanosis, clubbing, or rash. R and LLE no edema.  Neuro: Alert & orientedx3, cranial nerves grossly intact. moves all 4 extremities w/o difficulty. Affect pleasant   ASSESSMENT & PLAN:  1. Chronic systolic CHF: - Primarily nonischemic cardiomyopathy. Echo in 2017 with EF 15-20%. Medtronic ICD. Echo 10/2017 EF 15-20%, severely dilated RV with severely decreased systolic function. LHC/RHC 11/06/17 showed ongoing volume overload with 80% RCA stenosis.  Cardiac index low at 1.91. The degree of coronary disease does not explain his cardiomyopathy. Medtronic  Optivol: Sent, but did not result.   - NYHA II-III symptoms - Volume status stable on exam.   - Continue torsemide 40 mg daily.  - Continue entresto 49/51 mg BID.  - Holding off on eplerenone 25 daily. (gynecomastia with spiro) - Continue hydralazine 10 mg daily, imdur 10 mg daily  - Continue digoxin 0.125 mg daily - Hold off on BB with fatigue and recent low CI for now.  - Reinforced fluid restriction to < 2 L daily, sodium restriction to less than 2000 mg daily, and the importance of daily weights.   2. COPD: - Never smoked, but apparently had significant occupational exposure. It appears that he won a lawsuit dealing with the occupational exposure-related COPD.  - Continue 2 liters oxygen. Encouraged  3. CAD: - LHC 5/20: 80-90% proximal RCA stenosis with DES to RCA.  This did not cause his cardiomyopathy but is a large RCA. - No s/s of ischemia.    - Continue ASA, plavix - On DAPT for 6 months (through 05/05/18) - Continue atorvastatin  4. Polycythemia: - Likely related to chronic hypoxia.  - Refuses sleep study.  5. OSA - Refuses sleep study 6. DM - Continue jardiance - Per PCP   Doing OK today. Long discussion about medical compliance. Will follow closely in 3 weeks. No changes today with intermittent compliance at best.   Graciella Freer, PA-C 02/22/18   Greater than 50% of the 25 minute visit was spent in counseling/coordination of care regarding disease state education, salt/fluid restriction, sliding scale diuretics, and medication compliance.

## 2018-03-04 ENCOUNTER — Ambulatory Visit (INDEPENDENT_AMBULATORY_CARE_PROVIDER_SITE_OTHER): Payer: Medicare Other | Admitting: *Deleted

## 2018-03-04 ENCOUNTER — Encounter: Payer: Self-pay | Admitting: Cardiology

## 2018-03-04 DIAGNOSIS — I428 Other cardiomyopathies: Secondary | ICD-10-CM

## 2018-03-04 NOTE — Progress Notes (Signed)
Remote ICD transmission.   

## 2018-03-06 ENCOUNTER — Other Ambulatory Visit (HOSPITAL_COMMUNITY): Payer: Self-pay

## 2018-03-06 ENCOUNTER — Other Ambulatory Visit: Payer: Self-pay | Admitting: Internal Medicine

## 2018-03-06 ENCOUNTER — Telehealth (HOSPITAL_COMMUNITY): Payer: Self-pay | Admitting: Cardiology

## 2018-03-06 MED ORDER — HYDRALAZINE HCL 10 MG PO TABS: 10.0000 mg | ORAL_TABLET | Freq: Three times a day (TID) | ORAL | 3 refills | Status: DC

## 2018-03-06 NOTE — Telephone Encounter (Signed)
During patients home visit called to request refills on hydralazine. Medication refills addressed

## 2018-03-06 NOTE — Progress Notes (Signed)
Paramedicine Encounter    Patient ID: Richard [name], male    DOB: July 04, 1960, 57 y.o.   MRN: [id number]   Patient Care Team: Ananias Pilgrim, MD as PCP - General (Family Medicine)  Patient Active Problem List   Diagnosis Date Noted  . S/P right and left heart catheterization 11/02/2017  . Panic attacks 11/02/2017  . Non-STEMI (non-ST elevated myocardial infarction) (HCC)   . COPD (chronic obstructive pulmonary disease) (HCC)   . Hypertension   . CKD (chronic kidney disease)   . 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HCC)   . ICD (implantable cardioverter-defibrillator) in place   . GERD (gastroesophageal reflux disease)   . Diabetes mellitus with diabetic neuropathy, with long-term current use of insulin (HCC)     Current Outpatient Medications:  .  allopurinol (ZYLOPRIM) 100 MG tablet, Take 1 tablet (100 mg total) by mouth daily., Disp: 30 tablet, Rfl: 3 .  atorvastatin (LIPITOR) 40 MG tablet, Take 1 tablet (40 mg total) by mouth daily at 6 PM., Disp: 30 tablet, Rfl: 3 .  BREO ELLIPTA 100-25 MCG/INH AEPB, Inhale 1 puff into the lungs daily., Disp: , Rfl: 5 .  clopidogrel (PLAVIX) 75 MG tablet, Take 1 tablet (75 mg total) by mouth daily with breakfast., Disp: 30 tablet, Rfl: 3 .  digoxin (LANOXIN) 0.125 MG tablet, Take 1 tablet (0.125 mg total) by mouth daily., Disp: 30 tablet, Rfl: 3 .  diphenhydrAMINE (BENADRYL) 25 MG tablet, Take 25 mg by mouth at bedtime as needed., Disp: , Rfl:  .  insulin regular human CONCENTRATED (HUMULIN R U-500 KWIKPEN) 500 UNIT/ML kwikpen, USE AS DIRECTED PER SLIDING SCALE. MAX DAILY DOSE IS 400 UNITS., Disp: , Rfl:  .  isosorbide dinitrate (ISORDIL) 10 MG tablet, Take 1 tablet (10 mg total) by mouth 3 (three) times daily., Disp: 90 tablet, Rfl: 3 .  MAGNESIUM-OXIDE 400 (241.3 Mg) MG tablet, Take 400 mg by mouth daily., Disp: , Rfl: 11 .  omeprazole (PRILOSEC) 20 MG capsule, Take 20 mg by mouth daily., Disp: , Rfl: 1 .  potassium chloride SA (K-DUR,KLOR-CON) 20 MEQ  tablet, Take 1 tablet (20 mEq total) by mouth daily., Disp: 90 tablet, Rfl: 3 .  pregabalin (LYRICA) 100 MG capsule, Take 1 capsule (100 mg total) by mouth 2 (two) times daily., Disp: 60 capsule, Rfl: 3 .  sacubitril-valsartan (ENTRESTO) 49-51 MG, Take 1 tablet by mouth 2 (two) times daily., Disp: 60 tablet, Rfl: 11 .  torsemide (DEMADEX) 20 MG tablet, Take 2 tablets (40 mg total) by mouth daily., Disp: 60 tablet, Rfl: 11 .  Vitamin D, Cholecalciferol, 1000 units CAPS, Take 1,000 Units by mouth daily., Disp: , Rfl: 11 .  aspirin EC 81 MG EC tablet, Take 1 tablet (81 mg total) by mouth daily. (Patient not taking: Reported on 03/06/2018), Disp: 90 tablet, Rfl: 0 .  DULoxetine (CYMBALTA) 60 MG capsule, Take 60 mg by mouth daily., Disp: , Rfl: 2 .  hydrALAZINE (APRESOLINE) 10 MG tablet, Take 1 tablet (10 mg total) by mouth 3 (three) times daily., Disp: 270 tablet, Rfl: 3 .  Insulin Pen Needle (NOVOFINE) 30G X 8 MM MISC, Inject 10 each into the skin as needed. (Patient not taking: Reported on 03/06/2018), Disp: 100 each, Rfl: 0 Allergies  Allergen Reactions  . Codeine   . Coconut Oil Itching      Social History   Socioeconomic History  . Marital status: Divorced    Spouse name: Not on file  . Number of children: Not on file  .  Years of education: Not on file  . Highest education level: Not on file  Occupational History  . Occupation: disability    Comment: stopped working in 2000 d/t occupational exposures  Social Needs  . Financial resource strain: Not on file  . Food insecurity:    Worry: Not on file    Inability: Not on file  . Transportation needs:    Medical: Not on file    Non-medical: Not on file  Tobacco Use  . Smoking status: Former Smoker    Types: Cigarettes  . Smokeless tobacco: Never Used  . Tobacco comment: "smoked when I drank"  Substance and Sexual Activity  . 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 Drug use:  Never  . Sexual activity: Not Currently  Lifestyle  . Physical activity:    Days per week: Not on file    Minutes per session: Not on file  . Stress: Not on file  Relationships  . Social connections:    Talks on phone: Not on file    Gets together: Not on file    Attends religious service: Not on file    Active member of club or organization: Not on file    Attends meetings of clubs or organizations: Not on file    Relationship status: Not on file  . Intimate partner violence:    Fear of current or ex partner: Not on file    Emotionally abused: Not on file    Physically abused: Not on file    Forced sexual activity: Not on file  Other Topics Concern  . Not on file  Social History Narrative   Patient lives at home with wife and some of his 10 children. He self-administers his own medications.    Physical Exam  Constitutional: He is oriented to person, place, and time.  Cardiovascular: Regular rhythm.  Pulmonary/Chest: Effort normal and breath sounds normal.  Abdominal: He exhibits distension.  Musculoskeletal: Normal range of motion. He exhibits no edema.  Neurological: He is alert and oriented to person, place, and time.  Skin: Skin is warm and dry.  Psychiatric: He has a normal mood and affect.        Future Appointments  Date Time Provider Department Center  03/15/2018 11:30 AM [facility] PA/NP [facility] None  06/03/2018  8:35 AM [facility] DEVICE REMOTES [facility]    BP 122/60 (BP Location: Left Arm, Patient Position: Sitting, Cuff Size: Large)   Pulse 60   Resp 18   Wt 258 lb (117 kg)   SpO2 91%   BMI 29.81 kg/m   Weight yesterday- 258 lb Last visit weight- 257 lb  Richard [name] was seen at home today. He stated he has had some exertional dyspnea over the past week and said he believes it is the result of the increased humidity. He also complained of some constipation which has been an ongoing issue and he is awaiting a called from a GI specialist. He  reported that he has been compliant with his medications however he had several pills in his pillbox which he said were because he dropped them and did not know where they were from. He stated he knew what he was supposed to be taking however when he reached for medications he thought he was supposed to be taking, he did not pick up the correct pills. I corrected him explained what taking each medication as directed is important. Additionally,  he was drinking a 32 oz slushy from [employer]. I pointed out that was half of his allotted fluid for the day and he responded saying this was the first drink he has had all day. That was at 15:30. I will gather additional nutritional information and bring it to his at our next visit. His medications were verified and his pillbox was refilled. Hydralazine and magnesium oxide were ordered and will be needed in his pillbox tomorrow  But he said he would be able to pick it up today and I will go back tomorrow to finish filling his box.   [name], EMT 03/06/18  ACTION: Home visit completed Next visit planned for tomorrow

## 2018-03-15 ENCOUNTER — Other Ambulatory Visit (HOSPITAL_COMMUNITY): Payer: Self-pay

## 2018-03-15 ENCOUNTER — Encounter (HOSPITAL_COMMUNITY): Payer: Self-pay

## 2018-03-15 ENCOUNTER — Ambulatory Visit (HOSPITAL_COMMUNITY)
Admission: RE | Admit: 2018-03-15 | Discharge: 2018-03-15 | Disposition: A | Discharge status: Home / Self Care | Payer: Medicare Other | Source: Ambulatory Visit | Attending: Cardiology | Admitting: Cardiology

## 2018-03-15 VITALS — BP 128/90 | HR 87 | Wt 239.2 lb

## 2018-03-15 DIAGNOSIS — G4733 Obstructive sleep apnea (adult) (pediatric): Secondary | ICD-10-CM | POA: Diagnosis not present

## 2018-03-15 DIAGNOSIS — Z87891 Personal history of nicotine dependence: Secondary | ICD-10-CM | POA: Diagnosis not present

## 2018-03-15 DIAGNOSIS — I2510 Atherosclerotic heart disease of native coronary artery without angina pectoris: Secondary | ICD-10-CM | POA: Diagnosis not present

## 2018-03-15 DIAGNOSIS — Z794 Long term (current) use of insulin: Secondary | ICD-10-CM | POA: Diagnosis not present

## 2018-03-15 DIAGNOSIS — Z833 Family history of diabetes mellitus: Secondary | ICD-10-CM | POA: Insufficient documentation

## 2018-03-15 DIAGNOSIS — Z885 Allergy status to narcotic agent status: Secondary | ICD-10-CM | POA: Insufficient documentation

## 2018-03-15 DIAGNOSIS — I5022 Chronic systolic (congestive) heart failure: Secondary | ICD-10-CM | POA: Insufficient documentation

## 2018-03-15 DIAGNOSIS — F419 Anxiety disorder, unspecified: Secondary | ICD-10-CM | POA: Insufficient documentation

## 2018-03-15 DIAGNOSIS — D751 Secondary polycythemia: Secondary | ICD-10-CM | POA: Diagnosis not present

## 2018-03-15 DIAGNOSIS — K219 Gastro-esophageal reflux disease without esophagitis: Secondary | ICD-10-CM | POA: Diagnosis not present

## 2018-03-15 DIAGNOSIS — Z95810 Presence of automatic (implantable) cardiac defibrillator: Secondary | ICD-10-CM | POA: Diagnosis not present

## 2018-03-15 DIAGNOSIS — I5043 Acute on chronic combined systolic (congestive) and diastolic (congestive) heart failure: Secondary | ICD-10-CM

## 2018-03-15 DIAGNOSIS — Z888 Allergy status to other drugs, medicaments and biological substances status: Secondary | ICD-10-CM | POA: Diagnosis not present

## 2018-03-15 DIAGNOSIS — Z955 Presence of coronary angioplasty implant and graft: Secondary | ICD-10-CM | POA: Insufficient documentation

## 2018-03-15 DIAGNOSIS — M109 Gout, unspecified: Secondary | ICD-10-CM | POA: Diagnosis not present

## 2018-03-15 DIAGNOSIS — E1140 Type 2 diabetes mellitus with diabetic neuropathy, unspecified: Secondary | ICD-10-CM | POA: Insufficient documentation

## 2018-03-15 DIAGNOSIS — I428 Other cardiomyopathies: Secondary | ICD-10-CM | POA: Diagnosis not present

## 2018-03-15 DIAGNOSIS — Z7982 Long term (current) use of aspirin: Secondary | ICD-10-CM | POA: Diagnosis not present

## 2018-03-15 DIAGNOSIS — I5042 Chronic combined systolic (congestive) and diastolic (congestive) heart failure: Secondary | ICD-10-CM | POA: Diagnosis not present

## 2018-03-15 DIAGNOSIS — Z7902 Long term (current) use of antithrombotics/antiplatelets: Secondary | ICD-10-CM | POA: Diagnosis not present

## 2018-03-15 DIAGNOSIS — J449 Chronic obstructive pulmonary disease, unspecified: Secondary | ICD-10-CM | POA: Diagnosis not present

## 2018-03-15 DIAGNOSIS — E7800 Pure hypercholesterolemia, unspecified: Secondary | ICD-10-CM | POA: Diagnosis not present

## 2018-03-15 DIAGNOSIS — N189 Chronic kidney disease, unspecified: Secondary | ICD-10-CM | POA: Insufficient documentation

## 2018-03-15 DIAGNOSIS — E119 Type 2 diabetes mellitus without complications: Secondary | ICD-10-CM

## 2018-03-15 DIAGNOSIS — Z79899 Other long term (current) drug therapy: Secondary | ICD-10-CM | POA: Insufficient documentation

## 2018-03-15 DIAGNOSIS — E1122 Type 2 diabetes mellitus with diabetic chronic kidney disease: Secondary | ICD-10-CM | POA: Diagnosis not present

## 2018-03-15 DIAGNOSIS — Z8249 Family history of ischemic heart disease and other diseases of the circulatory system: Secondary | ICD-10-CM | POA: Insufficient documentation

## 2018-03-15 DIAGNOSIS — I130 Hypertensive heart and chronic kidney disease with heart failure and stage 1 through stage 4 chronic kidney disease, or unspecified chronic kidney disease: Secondary | ICD-10-CM | POA: Insufficient documentation

## 2018-03-15 DIAGNOSIS — Z9981 Dependence on supplemental oxygen: Secondary | ICD-10-CM | POA: Diagnosis not present

## 2018-03-15 LAB — BASIC METABOLIC PANEL
Anion gap: 13 (ref 5–15)
BUN: 22 mg/dL — AB (ref 6–20)
CO2: 30 mmol/L (ref 22–32)
Calcium: 8.6 mg/dL — ABNORMAL LOW (ref 8.9–10.3)
Chloride: 100 mmol/L (ref 98–111)
Creatinine, Ser: 1.09 mg/dL (ref 0.61–1.24)
GFR calc Af Amer: >60 mL/min (ref >60)
GFR calc non Af Amer: >60 mL/min (ref >60)
Glucose, Bld: 119 mg/dL — ABNORMAL HIGH (ref 70–99)
POTASSIUM: 3.6 mmol/L (ref 3.5–5.1)
SODIUM: 143 mmol/L (ref 135–145)

## 2018-03-15 MED ORDER — EPLERENONE 25 MG PO TABS: 12.5000 mg | ORAL_TABLET | Freq: Every day | ORAL | 11 refills | Status: DC

## 2018-03-15 NOTE — Progress Notes (Signed)
Paramedicine Encounter   Patient ID: Richard [name] , male,   DOB: [birth year],57 y.o.,  MRN: [id number]  Mr Menge was seen in the clinic today with Otilio Saber, PA-C. He reported feeling well and reported being compliant with his medications. Per Mardelle Matte, we are adding eplerenone to his regimen. Mr Fawcett stated he would be able to pick up the new medications and add it to his pillbox without assistance. I instructed him to be sure he cuts the pill in half in accordance with the dosage. I will follow up next week and ensure he is taking it properly.   [name], EMT 03/15/2018   ACTION: Next visit planned for 1 week

## 2018-03-15 NOTE — Patient Instructions (Addendum)
START Inspra 12.5 mg (one half) tab daily  Labs today We will only contact you if something comes back abnormal or we need to make some changes. Otherwise no news is good news!  Labs needed in 7-10 days with recent medication changes  Your physician recommends that you schedule a follow-up appointment in: 2 months with Dr Shirlee Latch   Do the following things EVERYDAY: 1) Weigh yourself in the morning before breakfast. Write it down and keep it in a log. 2) Take your medicines as prescribed 3) Eat low salt foods-Limit salt (sodium) to 2000 mg per day.  4) Stay as active as you can everyday 5) Limit all fluids for the day to less than 2 liters

## 2018-03-15 NOTE — Progress Notes (Signed)
Advanced Heart Failure Clinic Note   PCP: Ananias Pilgrim, MD PCP-Cardiologist: Dr Shirlee Latch  HPI: Richard [name] is a 57 y.o. male with a history of chronic systolic HF s/p Medtronic ICD (12/2016), HTN, DM, and HLD.   Admitted 5/8-5/14/19 with A/C systolic HF. Echo showed EF 15-20%. Advanced HF team was consulted. He diuresed 20 lbs with IV lasix, then transitioned to torsemide 40 mg daily. Underwent R/LHC. Results below. HF meds were optimized. Betablocker was not started due to low CI and soft BPs. He was discharged on home oxygen. Referred to cardiac rehab. DC weight: 260 lbs.   He presents today for regular follow up. No changes made at last visit with non-compliance. He is back on all of his medicines and feeling well. BPs much improved. He feels like his breathing is back to the point from "when he was younger". Walked a mile this am without difficultly. Watching salt and fluid. Says he isn't needing his O2 very often at home. Paramedicine following.   Review of systems complete and found to be negative unless listed in HPI.    Echo 11/01/17: EF 15-20%, grade 2 DD, mild MR, RV severely reduced, LA moderately dilated, RA mildly dilated, midl TR,   LHC/RHC (5/10):  Coronary Findings  Diagnostic  Dominance: Right  Left Main  No significant disease.  Left Anterior Descending  80% distal LAD stenosis.  Ramus Intermedius  Moderate ramus with luminal irregularities.  Left Circumflex  Luminal irregularities.  Right Coronary Artery  Focal 80% stenosis proximal RCA.  Intervention  Successful direct stent PCI on the proximal RCA with 85% stenosis and TIMI grade III flow reduced to 0% with TIMI Grade 3 Flow. Right Heart   Right Heart Pressures RHC Procedural Findings: Hemodynamics (mmHg)  RA 13 RV 67/18 PA 69/31, mean 47 PCWP mean 24 LV 128/33 AO 117/85  Oxygen saturations: PA 66% AO 98%  Cardiac Output (Fick) 4.8  Cardiac Index (Fick) 1.91 PVR 4.8 WU   Past Medical History:    Diagnosis Date  . AICD (automatic cardioverter/defibrillator) present 01/11/2017  . Anxiety   . CKD (chronic kidney disease)   . COPD (chronic obstructive pulmonary disease) (HCC)    never smoker, industrial exposure  . Diabetes mellitus with diabetic neuropathy, with long-term current use of insulin (HCC)   . GERD (gastroesophageal reflux disease)   . High cholesterol   . History of gout    "take RX qd" (11/01/2017)  . Hypertension   . Nonobstructive atherosclerosis of coronary artery   . On home oxygen therapy    "2L prn" (11/01/2017)  . 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HCC)    LLL, present for years  . Systolic HF (heart failure) (HCC)    Current Outpatient Medications  Medication Sig Dispense Refill  . allopurinol (ZYLOPRIM) 100 MG tablet Take 1 tablet (100 mg total) by mouth daily. 30 tablet 3  . aspirin EC 81 MG EC tablet Take 1 tablet (81 mg total) by mouth daily. 90 tablet 0  . atorvastatin (LIPITOR) 40 MG tablet Take 1 tablet (40 mg total) by mouth daily at 6 PM. 30 tablet 3  . BREO ELLIPTA 100-25 MCG/INH AEPB Inhale 1 puff into the lungs daily.  5  . clopidogrel (PLAVIX) 75 MG tablet Take 1 tablet (75 mg total) by mouth daily with breakfast. 30 tablet 3  . digoxin (LANOXIN) 0.125 MG tablet Take 1 tablet (0.125 mg total) by mouth daily. 30 tablet 3  . diphenhydrAMINE (BENADRYL) 25 MG tablet Take  25 mg by mouth at bedtime as needed.    . DULoxetine (CYMBALTA) 60 MG capsule Take 60 mg by mouth daily.  2  . hydrALAZINE (APRESOLINE) 10 MG tablet Take 1 tablet (10 mg total) by mouth 3 (three) times daily. 270 tablet 3  . Insulin Pen Needle (NOVOFINE) 30G X 8 MM MISC Inject 10 each into the skin as needed. 100 each 0  . insulin regular human CONCENTRATED (HUMULIN R U-500 KWIKPEN) 500 UNIT/ML kwikpen USE AS DIRECTED PER SLIDING SCALE. MAX DAILY DOSE IS 400 UNITS.    Marland Kitchen isosorbide dinitrate (ISORDIL) 10 MG tablet Take 1 tablet (10 mg total) by mouth 3 (three) times daily. 90 tablet 3  .  MAGNESIUM-OXIDE 400 (241.3 Mg) MG tablet Take 400 mg by mouth daily.  11  . omeprazole (PRILOSEC) 20 MG capsule Take 20 mg by mouth daily.  1  . potassium chloride SA (K-DUR,KLOR-CON) 20 MEQ tablet Take 1 tablet (20 mEq total) by mouth daily. 90 tablet 3  . pregabalin (LYRICA) 100 MG capsule Take 1 capsule (100 mg total) by mouth 2 (two) times daily. 60 capsule 3  . sacubitril-valsartan (ENTRESTO) 49-51 MG Take 1 tablet by mouth 2 (two) times daily. 60 tablet 11  . torsemide (DEMADEX) 20 MG tablet Take 2 tablets (40 mg total) by mouth daily. 60 tablet 11  . Vitamin D, Cholecalciferol, 1000 units CAPS Take 1,000 Units by mouth daily.  11   No current facility-administered medications for this encounter.    Allergies  Allergen Reactions  . Codeine   . Coconut Oil Itching    Social History   Socioeconomic History  . Marital status: Divorced    Spouse name: Not on file  . Number of children: Not on file  . Years of education: Not on file  . Highest education level: Not on file  Occupational History  . Occupation: disability    Comment: stopped working in 2000 d/t occupational exposures  Social Needs  . Financial resource strain: Not on file  . Food insecurity:    Worry: Not on file    Inability: Not on file  . Transportation needs:    Medical: Not on file    Non-medical: Not on file  Tobacco Use  . Smoking status: Former Smoker    Types: Cigarettes  . Smokeless tobacco: Never Used  . Tobacco comment: "smoked when I drank"  Substance and Sexual Activity  . 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 Drug use: Never  . Sexual activity: Not Currently  Lifestyle  . Physical activity:    Days per week: Not on file    Minutes per session: Not on file  . Stress: Not on file  Relationships  . Social connections:    Talks on phone: Not on file    Gets together: Not on file    Attends religious service: Not on file    Active  member of club or organization: Not on file    Attends meetings of clubs or organizations: Not on file    Relationship status: Not on file  . Intimate partner violence:    Fear of current or ex partner: Not on file    Emotionally abused: Not on file    Physically abused: Not on file    Forced sexual activity: Not on file  Other Topics Concern  . Not on file  Social History Narrative   Patient  lives at home with wife and some of his 10 children. He self-administers his own medications.   Family History  Problem Relation Age of Onset  . Hypertension Mother   . Diabetes Mother   . Hypertension Father   . Diabetes Father   . Diabetes Sister   . Diabetes Brother    Vitals:   03/15/18 1151  BP: 128/90  Pulse: 87  SpO2: 90%  Weight: 108.5 kg (239 lb 3.2 oz)     Wt Readings from Last 3 Encounters:  03/15/18 108.5 kg (239 lb 3.2 oz)  03/06/18 117 kg (258 lb)  02/22/18 116.6 kg (257 lb)   PHYSICAL EXAM: General: Well appearing. No resp difficulty. HEENT: Normal Neck: Supple. JVP 5-6. Carotids 2+ bilat; no bruits. No thyromegaly or nodule noted. Cor: PMI nondisplaced. RRR, No M/G/R noted Lungs: CTAB, normal effort. Abdomen: Soft, non-tender, non-distended, no HSM. No bruits or masses. +BS  Extremities: No cyanosis, clubbing, or rash. R and LLE no edema.  Neuro: Alert & orientedx3, cranial nerves grossly intact. moves all 4 extremities w/o difficulty. Affect pleasant   ASSESSMENT & PLAN:  1. Chronic systolic CHF: - Primarily nonischemic cardiomyopathy. Echo in 2017 with EF 15-20%. Medtronic ICD. Echo 10/2017 EF 15-20%, severely dilated RV with severely decreased systolic function. LHC/RHC 11/06/17 showed ongoing volume overload with 80% RCA stenosis.  Cardiac index low at 1.91. The degree of coronary disease does not explain his cardiomyopathy. - NYHA II symptoms - Volume status stable one exam   - Continue torsemide 40 mg daily.  - Continue entresto 49/51 mg BID.  - Add  back eplerenone 12.5 mg daily (gynecomastia with spiro). BMET today and 7-10 days.  - Continue hydralazine 10 mg daily, imdur 10 mg daily  - Continue digoxin 0.125 mg daily - Hold off on BB with fatigue and recent low CI for now.  - Reinforced fluid restriction to < 2 L daily, sodium restriction to less than 2000 mg daily, and the importance of daily weights.     2. COPD: - Never smoked, but apparently had significant occupational exposure. It appears that he won a lawsuit dealing with the occupational exposure-related COPD.  - Continue 2 liters oxygen. Encouraged him to use regularly.  3. CAD: - LHC 5/20: 80-90% proximal RCA stenosis with DES to RCA.  This did not cause his cardiomyopathy but is a large RCA. - No s/s of ischemia.       - Continue ASA, plavix - On DAPT for 6 months (through 05/05/18) - Continue atorvastatin  4. Polycythemia: - Likely related to chronic hypoxia.  - Refuses sleep study.  5. OSA - Refuses sleep study 6. DM - Continue jardiance.  - Per PCP   Doing well overall. Meds and labs as above. RTC 2 months with MD. Continue paramedicine.   Graciella Freer, PA-C 03/15/18   Greater than 50% of the 25 minute visit was spent in counseling/coordination of care regarding disease state education, salt/fluid restriction, sliding scale diuretics, and medication compliance.

## 2018-03-20 ENCOUNTER — Other Ambulatory Visit (HOSPITAL_COMMUNITY): Payer: Self-pay

## 2018-03-20 NOTE — Progress Notes (Signed)
Paramedicine Encounter    Patient ID: Richard [name], male    DOB: 1960-11-24, 57 y.o.   MRN: [id number]   Patient Care Team: Ananias Pilgrim, MD as PCP - General (Family Medicine)  Patient Active Problem List   Diagnosis Date Noted  . Acute on chronic combined systolic and diastolic CHF (congestive heart failure) (HCC) 03/15/2018  . S/P right and left heart catheterization 11/02/2017  . Panic attacks 11/02/2017  . Non-STEMI (non-ST elevated myocardial infarction) (HCC)   . COPD (chronic obstructive pulmonary disease) (HCC)   . Hypertension   . CKD (chronic kidney disease)   . 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HCC)   . ICD (implantable cardioverter-defibrillator) in place   . GERD (gastroesophageal reflux disease)   . Diabetes mellitus with diabetic neuropathy, with long-term current use of insulin (HCC)     Current Outpatient Medications:  .  allopurinol (ZYLOPRIM) 100 MG tablet, Take 1 tablet (100 mg total) by mouth daily., Disp: 30 tablet, Rfl: 3 .  atorvastatin (LIPITOR) 40 MG tablet, Take 1 tablet (40 mg total) by mouth daily at 6 PM., Disp: 30 tablet, Rfl: 3 .  BREO ELLIPTA 100-25 MCG/INH AEPB, Inhale 1 puff into the lungs daily., Disp: , Rfl: 5 .  clopidogrel (PLAVIX) 75 MG tablet, Take 1 tablet (75 mg total) by mouth daily with breakfast., Disp: 30 tablet, Rfl: 3 .  digoxin (LANOXIN) 0.125 MG tablet, Take 1 tablet (0.125 mg total) by mouth daily., Disp: 30 tablet, Rfl: 3 .  DULoxetine (CYMBALTA) 60 MG capsule, Take 60 mg by mouth daily., Disp: , Rfl: 2 .  eplerenone (INSPRA) 25 MG tablet, Take 0.5 tablets (12.5 mg total) by mouth daily., Disp: 15 tablet, Rfl: 11 .  hydrALAZINE (APRESOLINE) 10 MG tablet, Take 1 tablet (10 mg total) by mouth 3 (three) times daily., Disp: 270 tablet, Rfl: 3 .  insulin regular human CONCENTRATED (HUMULIN R U-500 KWIKPEN) 500 UNIT/ML kwikpen, USE AS DIRECTED PER SLIDING SCALE. MAX DAILY DOSE IS 400 UNITS., Disp: , Rfl:  .  isosorbide dinitrate (ISORDIL) 10  MG tablet, Take 1 tablet (10 mg total) by mouth 3 (three) times daily., Disp: 90 tablet, Rfl: 3 .  MAGNESIUM-OXIDE 400 (241.3 Mg) MG tablet, Take 400 mg by mouth daily., Disp: , Rfl: 11 .  omeprazole (PRILOSEC) 20 MG capsule, Take 20 mg by mouth daily., Disp: , Rfl: 1 .  potassium chloride SA (K-DUR,KLOR-CON) 20 MEQ tablet, Take 1 tablet (20 mEq total) by mouth daily., Disp: 90 tablet, Rfl: 3 .  pregabalin (LYRICA) 100 MG capsule, Take 1 capsule (100 mg total) by mouth 2 (two) times daily., Disp: 60 capsule, Rfl: 3 .  sacubitril-valsartan (ENTRESTO) 49-51 MG, Take 1 tablet by mouth 2 (two) times daily., Disp: 60 tablet, Rfl: 11 .  torsemide (DEMADEX) 20 MG tablet, Take 2 tablets (40 mg total) by mouth daily., Disp: 60 tablet, Rfl: 11 .  aspirin EC 81 MG EC tablet, Take 1 tablet (81 mg total) by mouth daily. (Patient not taking: Reported on 03/20/2018), Disp: 90 tablet, Rfl: 0 .  diphenhydrAMINE (BENADRYL) 25 MG tablet, Take 25 mg by mouth at bedtime as needed., Disp: , Rfl:  .  Insulin Pen Needle (NOVOFINE) 30G X 8 MM MISC, Inject 10 each into the skin as needed. (Patient not taking: Reported on 03/20/2018), Disp: 100 each, Rfl: 0 .  Vitamin D, Cholecalciferol, 1000 units CAPS, Take 1,000 Units by mouth daily., Disp: , Rfl: 11 Allergies  Allergen Reactions  . Codeine   .  Coconut Oil Itching      Social History   Socioeconomic History  . Marital status: Divorced    Spouse name: Not on file  . Number of children: Not on file  . Years of education: Not on file  . Highest education level: Not on file  Occupational History  . Occupation: disability    Comment: stopped working in 2000 d/t occupational exposures  Social Needs  . Financial resource strain: Not on file  . Food insecurity:    Worry: Not on file    Inability: Not on file  . Transportation needs:    Medical: Not on file    Non-medical: Not on file  Tobacco Use  . Smoking status: Former Smoker    Types: Cigarettes  .  Smokeless tobacco: Never Used  . Tobacco comment: "smoked when I drank"  Substance and Sexual Activity  . 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 Drug use: Never  . Sexual activity: Not Currently  Lifestyle  . Physical activity:    Days per week: Not on file    Minutes per session: Not on file  . Stress: Not on file  Relationships  . Social connections:    Talks on phone: Not on file    Gets together: Not on file    Attends religious service: Not on file    Active member of club or organization: Not on file    Attends meetings of clubs or organizations: Not on file    Relationship status: Not on file  . Intimate partner violence:    Fear of current or ex partner: Not on file    Emotionally abused: Not on file    Physically abused: Not on file    Forced sexual activity: Not on file  Other Topics Concern  . Not on file  Social History Narrative   Patient lives at home with wife and some of his 10 children. He self-administers his own medications.    Physical Exam  Constitutional: He is oriented to person, place, and time.  Cardiovascular: Bradycardia present.  Pulmonary/Chest: Effort normal and breath sounds normal.  Musculoskeletal: Normal range of motion.  Neurological: He is alert and oriented to person, place, and time.  Skin: Skin is warm and dry.  Psychiatric: He has a normal mood and affect.        Future Appointments  Date Time Provider Department Center  03/22/2018 12:00 PM [facility] LAB [facility] None  06/03/2018  8:35 AM [facility] DEVICE REMOTES [facility]  06/07/2018 12:00 PM Laurey Morale, MD [facility] None    BP 114/70 (BP Location: Left Arm, Patient Position: Sitting, Cuff Size: Large)   Pulse (!) 46   Resp 16   Wt 252 lb (114.3 kg)   SpO2 95%   BMI 29.12 kg/m   Weight yesterday- 252 lb  Last visit weight- 258 lb  [name] was seen at home today and reported feeling well. He  stated he has been taking his medications as prescribed and his weights have been stable. He denied SOB, headache, dizziness or orthopnea. His medications were verified and his pillbox was refilled. All necessary medications were ordered from the pharmacy and his box was labeled to show him where he needs to put the medications when he gets them.   [name], EMT 03/20/18  ACTION: Home visit completed Next visit planned for 2 weeks

## 2018-03-22 ENCOUNTER — Inpatient Hospital Stay (HOSPITAL_COMMUNITY): Admission: RE | Admit: 2018-03-22 | Payer: Medicare Other | Source: Ambulatory Visit

## 2018-03-23 LAB — CUP PACEART REMOTE DEVICE CHECK
Battery Remaining Longevity: 122 mo
Battery Voltage: 3.02 V
Brady Statistic AP VS Percent: 0.06 %
Brady Statistic AS VP Percent: 0.05 %
Brady Statistic AS VS Percent: 99.88 %
Date Time Interrogation Session: 20190909084224
HIGH POWER IMPEDANCE MEASURED VALUE: 64 Ohm
Implantable Lead Implant Date: 20180719
Implantable Lead Location: 753859
Implantable Lead Model: 5076
Implantable Pulse Generator Implant Date: 20180719
Lead Channel Impedance Value: 342 Ohm
Lead Channel Pacing Threshold Amplitude: 0.625 V
Lead Channel Pacing Threshold Amplitude: 1.25 V
Lead Channel Pacing Threshold Pulse Width: 0.4 ms
Lead Channel Pacing Threshold Pulse Width: 0.4 ms
Lead Channel Sensing Intrinsic Amplitude: 3.625 mV
Lead Channel Sensing Intrinsic Amplitude: 8.75 mV
Lead Channel Setting Pacing Amplitude: 2 V
Lead Channel Setting Sensing Sensitivity: 0.3 mV
MDC IDC LEAD IMPLANT DT: 20180719
MDC IDC LEAD LOCATION: 753860
MDC IDC MSMT LEADCHNL RA IMPEDANCE VALUE: 475 Ohm
MDC IDC MSMT LEADCHNL RA SENSING INTR AMPL: 3.625 mV
MDC IDC MSMT LEADCHNL RV IMPEDANCE VALUE: 418 Ohm
MDC IDC MSMT LEADCHNL RV SENSING INTR AMPL: 8.75 mV
MDC IDC SET LEADCHNL RA PACING AMPLITUDE: 2.5 V
MDC IDC SET LEADCHNL RV PACING PULSEWIDTH: 0.4 ms
MDC IDC STAT BRADY AP VP PERCENT: 0 %
MDC IDC STAT BRADY RA PERCENT PACED: 0.07 %
MDC IDC STAT BRADY RV PERCENT PACED: 0.05 %

## 2018-03-26 ENCOUNTER — Ambulatory Visit (HOSPITAL_COMMUNITY)
Admission: RE | Admit: 2018-03-26 | Discharge: 2018-03-26 | Disposition: A | Discharge status: Home / Self Care | Payer: Medicare Other | Source: Ambulatory Visit | Attending: Cardiology | Admitting: Cardiology

## 2018-03-26 DIAGNOSIS — I5042 Chronic combined systolic (congestive) and diastolic (congestive) heart failure: Secondary | ICD-10-CM | POA: Diagnosis not present

## 2018-03-26 LAB — BASIC METABOLIC PANEL
ANION GAP: 10 (ref 5–15)
BUN: 18 mg/dL (ref 6–20)
CALCIUM: 9.6 mg/dL (ref 8.9–10.3)
CO2: 33 mmol/L — AB (ref 22–32)
Chloride: 94 mmol/L — ABNORMAL LOW (ref 98–111)
Creatinine, Ser: 1.18 mg/dL (ref 0.61–1.24)
GFR calc Af Amer: >60 mL/min (ref >60)
GFR calc non Af Amer: >60 mL/min (ref >60)
GLUCOSE: 319 mg/dL — AB (ref 70–99)
Potassium: 3.9 mmol/L (ref 3.5–5.1)
Sodium: 137 mmol/L (ref 135–145)

## 2018-03-27 ENCOUNTER — Telehealth (HOSPITAL_COMMUNITY): Payer: Self-pay

## 2018-03-27 NOTE — Telephone Encounter (Signed)
Pt called no answer no voice mail left will try to call again with lab results

## 2018-03-27 NOTE — Telephone Encounter (Signed)
Pt called about lab work with no answer, no voice mail answered

## 2018-03-28 ENCOUNTER — Other Ambulatory Visit: Payer: Self-pay | Admitting: Internal Medicine

## 2018-04-03 ENCOUNTER — Other Ambulatory Visit (HOSPITAL_COMMUNITY): Payer: Self-pay

## 2018-04-03 ENCOUNTER — Telehealth (HOSPITAL_COMMUNITY): Payer: Self-pay | Admitting: Pharmacist

## 2018-04-03 MED ORDER — ATORVASTATIN CALCIUM 40 MG PO TABS: 40.0000 mg | ORAL_TABLET | Freq: Every day | ORAL | 5 refills | Status: DC

## 2018-04-03 MED ORDER — CLOPIDOGREL BISULFATE 75 MG PO TABS: 75.0000 mg | ORAL_TABLET | Freq: Every day | ORAL | 5 refills | Status: DC

## 2018-04-03 NOTE — Telephone Encounter (Signed)
[name] Medical laboratory scientific officer) stated that Mr. Busscher was started on lisinopril last week by his PCP so he was taking lisinopril and Entresto together. He did note a few episodes of dizziness but BP ok. I have advised [name] to have patient stop lisinopril and continue with his Entresto.   Richard [name]. Bonnye Fava, PharmD, BCPS, CPP Clinical Pharmacist Phone: [phone] 04/03/2018 4:23 PM

## 2018-04-03 NOTE — Progress Notes (Signed)
Paramedicine Encounter    Patient ID: Richard [name], male    DOB: 16-Nov-1960, 57 y.o.   MRN: [id number]   Patient Care Team: Ananias Pilgrim, MD as PCP - General (Family Medicine)  Patient Active Problem List   Diagnosis Date Noted  . Acute on chronic combined systolic and diastolic CHF (congestive heart failure) (HCC) 03/15/2018  . S/P right and left heart catheterization 11/02/2017  . Panic attacks 11/02/2017  . Non-STEMI (non-ST elevated myocardial infarction) (HCC)   . COPD (chronic obstructive pulmonary disease) (HCC)   . Hypertension   . CKD (chronic kidney disease)   . 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HCC)   . ICD (implantable cardioverter-defibrillator) in place   . GERD (gastroesophageal reflux disease)   . Diabetes mellitus with diabetic neuropathy, with long-term current use of insulin (HCC)     Current Outpatient Medications:  .  allopurinol (ZYLOPRIM) 100 MG tablet, Take 1 tablet (100 mg total) by mouth daily., Disp: 30 tablet, Rfl: 3 .  aspirin EC 81 MG EC tablet, Take 1 tablet (81 mg total) by mouth daily., Disp: 90 tablet, Rfl: 0 .  atorvastatin (LIPITOR) 40 MG tablet, Take 1 tablet (40 mg total) by mouth daily at 6 PM., Disp: 30 tablet, Rfl: 5 .  BREO ELLIPTA 100-25 MCG/INH AEPB, Inhale 1 puff into the lungs daily., Disp: , Rfl: 5 .  clopidogrel (PLAVIX) 75 MG tablet, Take 1 tablet (75 mg total) by mouth daily with breakfast., Disp: 30 tablet, Rfl: 5 .  digoxin (LANOXIN) 0.125 MG tablet, Take 1 tablet (0.125 mg total) by mouth daily., Disp: 30 tablet, Rfl: 3 .  diphenhydrAMINE (BENADRYL) 25 MG tablet, Take 25 mg by mouth at bedtime as needed., Disp: , Rfl:  .  DULoxetine (CYMBALTA) 60 MG capsule, Take 60 mg by mouth daily., Disp: , Rfl: 2 .  eplerenone (INSPRA) 25 MG tablet, Take 0.5 tablets (12.5 mg total) by mouth daily., Disp: 15 tablet, Rfl: 11 .  hydrALAZINE (APRESOLINE) 10 MG tablet, Take 1 tablet (10 mg total) by mouth 3 (three) times daily., Disp: 270 tablet, Rfl:  3 .  insulin regular human CONCENTRATED (HUMULIN R U-500 KWIKPEN) 500 UNIT/ML kwikpen, USE AS DIRECTED PER SLIDING SCALE. MAX DAILY DOSE IS 400 UNITS., Disp: , Rfl:  .  isosorbide dinitrate (ISORDIL) 10 MG tablet, Take 1 tablet (10 mg total) by mouth 3 (three) times daily., Disp: 90 tablet, Rfl: 3 .  MAGNESIUM-OXIDE 400 (241.3 Mg) MG tablet, Take 400 mg by mouth daily., Disp: , Rfl: 11 .  omeprazole (PRILOSEC) 20 MG capsule, Take 20 mg by mouth daily., Disp: , Rfl: 1 .  potassium chloride SA (K-DUR,KLOR-CON) 20 MEQ tablet, Take 1 tablet (20 mEq total) by mouth daily., Disp: 90 tablet, Rfl: 3 .  pregabalin (LYRICA) 100 MG capsule, Take 1 capsule (100 mg total) by mouth 2 (two) times daily., Disp: 60 capsule, Rfl: 3 .  sacubitril-valsartan (ENTRESTO) 49-51 MG, Take 1 tablet by mouth 2 (two) times daily., Disp: 60 tablet, Rfl: 11 .  torsemide (DEMADEX) 20 MG tablet, Take 2 tablets (40 mg total) by mouth daily., Disp: 60 tablet, Rfl: 11 .  Vitamin D, Cholecalciferol, 1000 units CAPS, Take 1,000 Units by mouth daily., Disp: , Rfl: 11 .  Insulin Pen Needle (NOVOFINE) 30G X 8 MM MISC, Inject 10 each into the skin as needed. (Patient not taking: Reported on 03/20/2018), Disp: 100 each, Rfl: 0 Allergies  Allergen Reactions  . Codeine   . Coconut Oil Itching  Social History   Socioeconomic History  . Marital status: Divorced    Spouse name: Not on file  . Number of children: Not on file  . Years of education: Not on file  . Highest education level: Not on file  Occupational History  . Occupation: disability    Comment: stopped working in 2000 d/t occupational exposures  Social Needs  . Financial resource strain: Not on file  . Food insecurity:    Worry: Not on file    Inability: Not on file  . Transportation needs:    Medical: Not on file    Non-medical: Not on file  Tobacco Use  . Smoking status: Former Smoker    Types: Cigarettes  . Smokeless tobacco: Never Used  . Tobacco  comment: "smoked when I drank"  Substance and Sexual Activity  . 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 Drug use: Never  . Sexual activity: Not Currently  Lifestyle  . Physical activity:    Days per week: Not on file    Minutes per session: Not on file  . Stress: Not on file  Relationships  . Social connections:    Talks on phone: Not on file    Gets together: Not on file    Attends religious service: Not on file    Active member of club or organization: Not on file    Attends meetings of clubs or organizations: Not on file    Relationship status: Not on file  . Intimate partner violence:    Fear of current or ex partner: Not on file    Emotionally abused: Not on file    Physically abused: Not on file    Forced sexual activity: Not on file  Other Topics Concern  . Not on file  Social History Narrative   Patient lives at home with wife and some of his 10 children. He self-administers his own medications.    Physical Exam  Constitutional: He is oriented to person, place, and time.  Cardiovascular: Normal rate and regular rhythm.  Pulmonary/Chest: Effort normal and breath sounds normal.  Abdominal: Soft.  Musculoskeletal: Normal range of motion. He exhibits edema.  Neurological: He is alert and oriented to person, place, and time.  Skin: Skin is warm and dry.  Psychiatric: He has a normal mood and affect.        Future Appointments  Date Time Provider Department Center  06/03/2018  8:35 AM [facility]  06/07/2018 12:00 PM Laurey Morale, MD [facility] None    BP 110/70 (BP Location: Left Arm, Patient Position: Sitting, Cuff Size: Large)   Pulse 90   Resp 16   Wt 260 lb (117.9 kg)   SpO2 95%   BMI 30.05 kg/m   Weight yesterday- 260 lb Last visit weight-252 lb  Mr Goetze was seen at home today and reported feeling "better today." He reported that while walking earlier  in the week, he became weak and sick on his stomach. He stated he came inside and rested and the feeling subsided. While preparing to verify his medications and fill his pillbox, I noticed that he had multiple medications in wrong places and in no sort of order. I asked about this and he said his "wife must have messed with it." Upon verifying his medications I noted that he had a bottle of Lisinopril which is contraindicated for use in patients already on Entresto.  He stated that his PCP placed him on that medication and he had been taking it for the past week. I advised he stop immediately and I sat it aside. I contacted Cicero Duck at the HF clinic and confirmed that he should not be taking the Lisinopril. His medications were verified and his pillbox was refilled. I will follow up next week to ensure he is doing well since his medications were in such a disarray today.   [name], EMT 04/03/18  ACTION: Home visit completed Next visit planned for 1 week

## 2018-04-09 ENCOUNTER — Telehealth (HOSPITAL_COMMUNITY): Payer: Self-pay

## 2018-04-09 NOTE — Telephone Encounter (Signed)
I called Richard [name] to schedule an appointment. He stated he would be home all day tomorrow so we agreed to meet at 15:15.

## 2018-04-10 ENCOUNTER — Other Ambulatory Visit (HOSPITAL_COMMUNITY): Payer: Self-pay

## 2018-04-10 ENCOUNTER — Other Ambulatory Visit (HOSPITAL_COMMUNITY): Payer: Self-pay | Admitting: *Deleted

## 2018-04-10 MED ORDER — CLOPIDOGREL BISULFATE 75 MG PO TABS: 75.0000 mg | ORAL_TABLET | Freq: Every day | ORAL | 5 refills | Status: DC

## 2018-04-10 NOTE — Progress Notes (Signed)
Paramedicine Encounter    Patient ID: Richard [name], male    DOB: 07/29/1960, 57 y.o.   MRN: [id number]   Patient Care Team: Ananias Pilgrim, MD as PCP - General (Family Medicine)  Patient Active Problem List   Diagnosis Date Noted  . Acute on chronic combined systolic and diastolic CHF (congestive heart failure) (HCC) 03/15/2018  . S/P right and left heart catheterization 11/02/2017  . Panic attacks 11/02/2017  . Non-STEMI (non-ST elevated myocardial infarction) (HCC)   . COPD (chronic obstructive pulmonary disease) (HCC)   . Hypertension   . CKD (chronic kidney disease)   . 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HCC)   . ICD (implantable cardioverter-defibrillator) in place   . GERD (gastroesophageal reflux disease)   . Diabetes mellitus with diabetic neuropathy, with long-term current use of insulin (HCC)     Current Outpatient Medications:  .  allopurinol (ZYLOPRIM) 100 MG tablet, Take 1 tablet (100 mg total) by mouth daily., Disp: 30 tablet, Rfl: 3 .  aspirin EC 81 MG EC tablet, Take 1 tablet (81 mg total) by mouth daily., Disp: 90 tablet, Rfl: 0 .  atorvastatin (LIPITOR) 40 MG tablet, Take 1 tablet (40 mg total) by mouth daily at 6 PM., Disp: 30 tablet, Rfl: 5 .  BREO ELLIPTA 100-25 MCG/INH AEPB, Inhale 1 puff into the lungs daily., Disp: , Rfl: 5 .  clopidogrel (PLAVIX) 75 MG tablet, Take 1 tablet (75 mg total) by mouth daily with breakfast., Disp: 30 tablet, Rfl: 5 .  digoxin (LANOXIN) 0.125 MG tablet, Take 1 tablet (0.125 mg total) by mouth daily., Disp: 30 tablet, Rfl: 3 .  diphenhydrAMINE (BENADRYL) 25 MG tablet, Take 25 mg by mouth at bedtime as needed., Disp: , Rfl:  .  DULoxetine (CYMBALTA) 60 MG capsule, Take 60 mg by mouth daily., Disp: , Rfl: 2 .  eplerenone (INSPRA) 25 MG tablet, Take 0.5 tablets (12.5 mg total) by mouth daily., Disp: 15 tablet, Rfl: 11 .  hydrALAZINE (APRESOLINE) 10 MG tablet, Take 1 tablet (10 mg total) by mouth 3 (three) times daily., Disp: 270 tablet, Rfl:  3 .  insulin regular human CONCENTRATED (HUMULIN R U-500 KWIKPEN) 500 UNIT/ML kwikpen, USE AS DIRECTED PER SLIDING SCALE. MAX DAILY DOSE IS 400 UNITS., Disp: , Rfl:  .  isosorbide dinitrate (ISORDIL) 10 MG tablet, Take 1 tablet (10 mg total) by mouth 3 (three) times daily., Disp: 90 tablet, Rfl: 3 .  MAGNESIUM-OXIDE 400 (241.3 Mg) MG tablet, Take 400 mg by mouth daily., Disp: , Rfl: 11 .  omeprazole (PRILOSEC) 20 MG capsule, Take 20 mg by mouth daily., Disp: , Rfl: 1 .  potassium chloride SA (K-DUR,KLOR-CON) 20 MEQ tablet, Take 1 tablet (20 mEq total) by mouth daily., Disp: 90 tablet, Rfl: 3 .  pregabalin (LYRICA) 100 MG capsule, Take 1 capsule (100 mg total) by mouth 2 (two) times daily., Disp: 60 capsule, Rfl: 3 .  ranitidine (ZANTAC) 150 MG tablet, Take 150 mg by mouth 2 (two) times daily., Disp: , Rfl:  .  sacubitril-valsartan (ENTRESTO) 49-51 MG, Take 1 tablet by mouth 2 (two) times daily., Disp: 60 tablet, Rfl: 11 .  torsemide (DEMADEX) 20 MG tablet, Take 2 tablets (40 mg total) by mouth daily., Disp: 60 tablet, Rfl: 11 .  Insulin Pen Needle (NOVOFINE) 30G X 8 MM MISC, Inject 10 each into the skin as needed. (Patient not taking: Reported on 03/20/2018), Disp: 100 each, Rfl: 0 .  Vitamin D, Cholecalciferol, 1000 units CAPS, Take 1,000 Units by mouth  daily., Disp: , Rfl: 11 Allergies  Allergen Reactions  . Codeine   . Coconut Oil Itching      Social History   Socioeconomic History  . Marital status: Divorced    Spouse name: Not on file  . Number of children: Not on file  . Years of education: Not on file  . Highest education level: Not on file  Occupational History  . Occupation: disability    Comment: stopped working in 2000 d/t occupational exposures  Social Needs  . Financial resource strain: Not on file  . Food insecurity:    Worry: Not on file    Inability: Not on file  . Transportation needs:    Medical: Not on file    Non-medical: Not on file  Tobacco Use  . Smoking  status: Former Smoker    Types: Cigarettes  . Smokeless tobacco: Never Used  . Tobacco comment: "smoked when I drank"  Substance and Sexual Activity  . 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 Drug use: Never  . Sexual activity: Not Currently  Lifestyle  . Physical activity:    Days per week: Not on file    Minutes per session: Not on file  . Stress: Not on file  Relationships  . Social connections:    Talks on phone: Not on file    Gets together: Not on file    Attends religious service: Not on file    Active member of club or organization: Not on file    Attends meetings of clubs or organizations: Not on file    Relationship status: Not on file  . Intimate partner violence:    Fear of current or ex partner: Not on file    Emotionally abused: Not on file    Physically abused: Not on file    Forced sexual activity: Not on file  Other Topics Concern  . Not on file  Social History Narrative   Patient lives at home with wife and some of his 10 children. He self-administers his own medications.    Physical Exam  Constitutional: He appears well-developed.  Eyes: Pupils are equal, round, and reactive to light.  Neck: Normal range of motion. Neck supple.  Cardiovascular: Normal rate and regular rhythm.  Pulmonary/Chest: Effort normal and breath sounds normal. No respiratory distress.  Musculoskeletal: Normal range of motion. He exhibits edema. He exhibits no tenderness.  Neurological: He is alert.  Skin: Skin is warm and dry. Capillary refill takes less than 2 seconds. He is not diaphoretic.        Future Appointments  Date Time Provider Department Center  06/03/2018  8:35 AM [facility]  06/07/2018 12:00 PM Laurey Morale, MD [facility] None    BP (!) 160/70 (BP Location: Left Arm, Patient Position: Sitting, Cuff Size: Large)   Pulse 80   Resp 16   Wt 254 lb (115.2 kg)    SpO2 97%   BMI 29.35 kg/m   Weight yesterday- 254LBS Last visit weight- 260LBS   Arrived to find patient ambulating around his home without difficulty. Patient had some edema noted to the lower legs with no tenderness noted. Patient stated he was taking all of his medications properly. Pill box filled and medications confirmed with medications refilled. Vitals obtained and as noted. Patient had no further complaints. Next visit scheduled.     [name], EMT 04/10/18  ACTION: Home  visit completed Next visit planned for 1 week

## 2018-04-23 ENCOUNTER — Other Ambulatory Visit (HOSPITAL_COMMUNITY): Payer: Self-pay

## 2018-04-23 MED ORDER — DIGOXIN 125 MCG PO TABS: 0.1250 mg | ORAL_TABLET | Freq: Every day | ORAL | 3 refills | Status: DC

## 2018-04-24 ENCOUNTER — Telehealth (HOSPITAL_COMMUNITY): Payer: Self-pay

## 2018-04-24 ENCOUNTER — Telehealth (HOSPITAL_COMMUNITY): Payer: Self-pay | Admitting: Pharmacist

## 2018-04-24 ENCOUNTER — Other Ambulatory Visit (HOSPITAL_COMMUNITY): Payer: Self-pay

## 2018-04-24 MED ORDER — CLOPIDOGREL BISULFATE 75 MG PO TABS: 75.0000 mg | ORAL_TABLET | Freq: Every day | ORAL | 0 refills | Status: DC

## 2018-04-24 NOTE — Telephone Encounter (Signed)
I called [name] to let him know that Richard [name], Richard [name] had secured an 11 day supply of plavix which will last him until he was scheduled to end the regimen. I will deliver the medication to him tomorrow and place it in his pillbox. [name] was understanding and agreeable.

## 2018-04-24 NOTE — Telephone Encounter (Signed)
Called Richard [name] to see if he was available for an appointment today. He stated he would be home all day so I could come by when I had time. We agreed to meet at 14:15.

## 2018-04-24 NOTE — Progress Notes (Signed)
Paramedicine Encounter    Patient ID: Richard [name], male    DOB: 1961/03/29, 57 y.o.   MRN: [id number]   Patient Care Team: Ananias Pilgrim, MD as PCP - General (Family Medicine)  Patient Active Problem List   Diagnosis Date Noted  . Acute on chronic combined systolic and diastolic CHF (congestive heart failure) (HCC) 03/15/2018  . S/P right and left heart catheterization 11/02/2017  . Panic attacks 11/02/2017  . Non-STEMI (non-ST elevated myocardial infarction) (HCC)   . COPD (chronic obstructive pulmonary disease) (HCC)   . Hypertension   . CKD (chronic kidney disease)   . 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HCC)   . ICD (implantable cardioverter-defibrillator) in place   . GERD (gastroesophageal reflux disease)   . Diabetes mellitus with diabetic neuropathy, with long-term current use of insulin (HCC)     Current Outpatient Medications:  .  allopurinol (ZYLOPRIM) 100 MG tablet, Take 1 tablet (100 mg total) by mouth daily., Disp: 30 tablet, Rfl: 3 .  aspirin EC 81 MG EC tablet, Take 1 tablet (81 mg total) by mouth daily., Disp: 90 tablet, Rfl: 0 .  atorvastatin (LIPITOR) 40 MG tablet, Take 1 tablet (40 mg total) by mouth daily at 6 PM., Disp: 30 tablet, Rfl: 5 .  BREO ELLIPTA 100-25 MCG/INH AEPB, Inhale 1 puff into the lungs daily., Disp: , Rfl: 5 .  clopidogrel (PLAVIX) 75 MG tablet, Take 1 tablet (75 mg total) by mouth daily with breakfast., Disp: 30 tablet, Rfl: 5 .  digoxin (LANOXIN) 0.125 MG tablet, Take 1 tablet (0.125 mg total) by mouth daily., Disp: 30 tablet, Rfl: 3 .  diphenhydrAMINE (BENADRYL) 25 MG tablet, Take 25 mg by mouth at bedtime as needed., Disp: , Rfl:  .  DULoxetine (CYMBALTA) 60 MG capsule, Take 60 mg by mouth daily., Disp: , Rfl: 2 .  eplerenone (INSPRA) 25 MG tablet, Take 0.5 tablets (12.5 mg total) by mouth daily., Disp: 15 tablet, Rfl: 11 .  hydrALAZINE (APRESOLINE) 10 MG tablet, Take 1 tablet (10 mg total) by mouth 3 (three) times daily., Disp: 270 tablet, Rfl:  3 .  insulin regular human CONCENTRATED (HUMULIN R U-500 KWIKPEN) 500 UNIT/ML kwikpen, USE AS DIRECTED PER SLIDING SCALE. MAX DAILY DOSE IS 400 UNITS., Disp: , Rfl:  .  isosorbide dinitrate (ISORDIL) 10 MG tablet, Take 1 tablet (10 mg total) by mouth 3 (three) times daily., Disp: 90 tablet, Rfl: 3 .  MAGNESIUM-OXIDE 400 (241.3 Mg) MG tablet, Take 400 mg by mouth daily., Disp: , Rfl: 11 .  omeprazole (PRILOSEC) 20 MG capsule, Take 20 mg by mouth daily., Disp: , Rfl: 1 .  potassium chloride SA (K-DUR,KLOR-CON) 20 MEQ tablet, Take 1 tablet (20 mEq total) by mouth daily., Disp: 90 tablet, Rfl: 3 .  pregabalin (LYRICA) 100 MG capsule, Take 1 capsule (100 mg total) by mouth 2 (two) times daily., Disp: 60 capsule, Rfl: 3 .  sacubitril-valsartan (ENTRESTO) 49-51 MG, Take 1 tablet by mouth 2 (two) times daily., Disp: 60 tablet, Rfl: 11 .  torsemide (DEMADEX) 20 MG tablet, Take 2 tablets (40 mg total) by mouth daily., Disp: 60 tablet, Rfl: 11 .  Insulin Pen Needle (NOVOFINE) 30G X 8 MM MISC, Inject 10 each into the skin as needed. (Patient not taking: Reported on 03/20/2018), Disp: 100 each, Rfl: 0 .  ranitidine (ZANTAC) 150 MG tablet, Take 150 mg by mouth 2 (two) times daily., Disp: , Rfl:  .  Vitamin D, Cholecalciferol, 1000 units CAPS, Take 1,000 Units by mouth  daily., Disp: , Rfl: 11 Allergies  Allergen Reactions  . Codeine   . Coconut Oil Itching      Social History   Socioeconomic History  . Marital status: Divorced    Spouse name: Not on file  . Number of children: Not on file  . Years of education: Not on file  . Highest education level: Not on file  Occupational History  . Occupation: disability    Comment: stopped working in 2000 d/t occupational exposures  Social Needs  . Financial resource strain: Not on file  . Food insecurity:    Worry: Not on file    Inability: Not on file  . Transportation needs:    Medical: Not on file    Non-medical: Not on file  Tobacco Use  . Smoking  status: Former Smoker    Types: Cigarettes  . Smokeless tobacco: Never Used  . Tobacco comment: "smoked when I drank"  Substance and Sexual Activity  . 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 Drug use: Never  . Sexual activity: Not Currently  Lifestyle  . Physical activity:    Days per week: Not on file    Minutes per session: Not on file  . Stress: Not on file  Relationships  . Social connections:    Talks on phone: Not on file    Gets together: Not on file    Attends religious service: Not on file    Active member of club or organization: Not on file    Attends meetings of clubs or organizations: Not on file    Relationship status: Not on file  . Intimate partner violence:    Fear of current or ex partner: Not on file    Emotionally abused: Not on file    Physically abused: Not on file    Forced sexual activity: Not on file  Other Topics Concern  . Not on file  Social History Narrative   Patient lives at home with wife and some of his 10 children. He self-administers his own medications.    Physical Exam  Constitutional: He is oriented to person, place, and time.  Cardiovascular: Regular rhythm.  Pulmonary/Chest: Effort normal and breath sounds normal.  Abdominal: He exhibits distension.  Musculoskeletal: Normal range of motion. He exhibits edema.  Neurological: He is alert and oriented to person, place, and time.  Skin: Skin is warm and dry.        Future Appointments  Date Time Provider Department Center  06/03/2018  8:35 AM [facility]  06/07/2018 12:00 PM Laurey Morale, MD [facility] None    BP 110/70 (BP Location: Left Arm, Patient Position: Sitting, Cuff Size: Large)   Pulse (!) 44   Resp 16   Wt 254 lb 11.2 oz (115.5 kg)   SpO2 94%   BMI 29.43 kg/m   Weight yesterday- Did not weigh Last visit weight- 254 lb  Richard [name] was seen at home today and  reported that he is on the uphill side of a chest cold. His PCP prescribed him antibiotics and a steroid to help him get over it. He reported being compliant with his medications over the past two weeks however he had multiple medications still in his pillbox. He said those medications were still in the box because when he takes all his medicine at the same time (all morning pills at once, all afternoon mpills at once  and all evening pills at once) he starts to feel bad. To try fixing this issue, I spread his medications out through the day so he is taking medicine 4 times a day, but less at once. He was open to this idea however I am reducing his visits from every two weeks to every week to try and improve compliance. I was not able to find his bottle of plavix and the pharmacy stated he picked up a 90 day supply at the beginning of October so they could not refill it. I contacted Elizabeth Palau, PharmD, who is working on a solution. His medications were verified and his pillbox was refilled.   [name], EMT 04/24/18  ACTION: Home visit completed Next visit planned for 1 week

## 2018-04-24 NOTE — Telephone Encounter (Signed)
[name] (paramedicine) called stating that Mr. Hodgkiss has lost his 14 day supply of clopidogrel which was just filled at the beginning of October. Per discussion with Dr. Shirlee Latch, he only needs to continue on clopidogrel and ASA until 05/05/18 so will fill an 11 day supply to get him to 11/10 and then he can just continue on the ASA alone. [name] aware and will relay to plan to patient.   Tyler Deis. Bonnye Fava, PharmD, BCPS, CPP Clinical Pharmacist Phone: [phone] 04/24/2018 4:06 PM

## 2018-04-26 ENCOUNTER — Telehealth (HOSPITAL_COMMUNITY): Payer: Self-pay

## 2018-04-26 NOTE — Telephone Encounter (Signed)
I called Richard [name] to let him know I would be coming by to drop off the supply of Plavix form the HF clinic. He stated his wife found his medications, which he previously said he had not bought, while she was sweeping. He stated he put the pills in his box and did not need me to come today.

## 2018-04-30 ENCOUNTER — Other Ambulatory Visit (HOSPITAL_COMMUNITY): Payer: Self-pay

## 2018-04-30 MED ORDER — DIGOXIN 125 MCG PO TABS: 0.1250 mg | ORAL_TABLET | Freq: Every day | ORAL | 3 refills | Status: DC

## 2018-05-01 ENCOUNTER — Telehealth (HOSPITAL_COMMUNITY): Payer: Self-pay

## 2018-05-02 NOTE — Telephone Encounter (Signed)
I called Richard [name] to schedule an appointment. He did not answer so I left a voicemail requesting he call me back.  

## 2018-05-03 ENCOUNTER — Encounter (HOSPITAL_COMMUNITY): Payer: Medicare Other | Admitting: Internal Medicine

## 2018-05-03 ENCOUNTER — Other Ambulatory Visit (HOSPITAL_COMMUNITY): Payer: Medicare Other

## 2018-05-08 ENCOUNTER — Other Ambulatory Visit (HOSPITAL_COMMUNITY): Payer: Self-pay

## 2018-05-08 ENCOUNTER — Telehealth (HOSPITAL_COMMUNITY): Payer: Self-pay

## 2018-05-08 ENCOUNTER — Encounter (HOSPITAL_COMMUNITY): Payer: Medicare Other | Admitting: Internal Medicine

## 2018-05-08 NOTE — Progress Notes (Signed)
Paramedicine Encounter    Patient ID: Richard [name], male    DOB: 1961/03/25, 57 y.o.   MRN: [id number]   Patient Care Team: Ananias Pilgrim, MD as PCP - General (Family Medicine)  Patient Active Problem List   Diagnosis Date Noted  . Acute on chronic combined systolic and diastolic CHF (congestive heart failure) (HCC) 03/15/2018  . S/P right and left heart catheterization 11/02/2017  . Panic attacks 11/02/2017  . Non-STEMI (non-ST elevated myocardial infarction) (HCC)   . COPD (chronic obstructive pulmonary disease) (HCC)   . Hypertension   . CKD (chronic kidney disease)   . 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HCC)   . ICD (implantable cardioverter-defibrillator) in place   . GERD (gastroesophageal reflux disease)   . Diabetes mellitus with diabetic neuropathy, with long-term current use of insulin (HCC)     Current Outpatient Medications:  .  allopurinol (ZYLOPRIM) 100 MG tablet, Take 1 tablet (100 mg total) by mouth daily., Disp: 30 tablet, Rfl: 3 .  aspirin EC 81 MG EC tablet, Take 1 tablet (81 mg total) by mouth daily., Disp: 90 tablet, Rfl: 0 .  atorvastatin (LIPITOR) 40 MG tablet, Take 1 tablet (40 mg total) by mouth daily at 6 PM., Disp: 30 tablet, Rfl: 5 .  BREO ELLIPTA 100-25 MCG/INH AEPB, Inhale 1 puff into the lungs daily., Disp: , Rfl: 5 .  clopidogrel (PLAVIX) 75 MG tablet, Take 1 tablet (75 mg total) by mouth daily with breakfast., Disp: 11 tablet, Rfl: 0 .  digoxin (LANOXIN) 0.125 MG tablet, Take 1 tablet (0.125 mg total) by mouth daily., Disp: 30 tablet, Rfl: 3 .  DULoxetine (CYMBALTA) 60 MG capsule, Take 60 mg by mouth daily., Disp: , Rfl: 2 .  eplerenone (INSPRA) 25 MG tablet, Take 0.5 tablets (12.5 mg total) by mouth daily., Disp: 15 tablet, Rfl: 11 .  hydrALAZINE (APRESOLINE) 10 MG tablet, Take 1 tablet (10 mg total) by mouth 3 (three) times daily., Disp: 270 tablet, Rfl: 3 .  insulin regular human CONCENTRATED (HUMULIN R U-500 KWIKPEN) 500 UNIT/ML kwikpen, USE AS DIRECTED  PER SLIDING SCALE. MAX DAILY DOSE IS 400 UNITS., Disp: , Rfl:  .  isosorbide dinitrate (ISORDIL) 10 MG tablet, Take 1 tablet (10 mg total) by mouth 3 (three) times daily., Disp: 90 tablet, Rfl: 3 .  MAGNESIUM-OXIDE 400 (241.3 Mg) MG tablet, Take 400 mg by mouth daily., Disp: , Rfl: 11 .  omeprazole (PRILOSEC) 20 MG capsule, Take 20 mg by mouth daily., Disp: , Rfl: 1 .  potassium chloride SA (K-DUR,KLOR-CON) 20 MEQ tablet, Take 1 tablet (20 mEq total) by mouth daily., Disp: 90 tablet, Rfl: 3 .  pregabalin (LYRICA) 100 MG capsule, Take 1 capsule (100 mg total) by mouth 2 (two) times daily., Disp: 60 capsule, Rfl: 3 .  sacubitril-valsartan (ENTRESTO) 49-51 MG, Take 1 tablet by mouth 2 (two) times daily., Disp: 60 tablet, Rfl: 11 .  torsemide (DEMADEX) 20 MG tablet, Take 2 tablets (40 mg total) by mouth daily., Disp: 60 tablet, Rfl: 11 .  diphenhydrAMINE (BENADRYL) 25 MG tablet, Take 25 mg by mouth at bedtime as needed., Disp: , Rfl:  .  Insulin Pen Needle (NOVOFINE) 30G X 8 MM MISC, Inject 10 each into the skin as needed. (Patient not taking: Reported on 03/20/2018), Disp: 100 each, Rfl: 0 .  ranitidine (ZANTAC) 150 MG tablet, Take 150 mg by mouth 2 (two) times daily., Disp: , Rfl:  .  Vitamin D, Cholecalciferol, 1000 units CAPS, Take 1,000 Units by mouth  daily., Disp: , Rfl: 11 Allergies  Allergen Reactions  . Codeine   . Coconut Oil Itching      Social History   Socioeconomic History  . Marital status: Divorced    Spouse name: Not on file  . Number of children: Not on file  . Years of education: Not on file  . Highest education level: Not on file  Occupational History  . Occupation: disability    Comment: stopped working in 2000 d/t occupational exposures  Social Needs  . Financial resource strain: Not on file  . Food insecurity:    Worry: Not on file    Inability: Not on file  . Transportation needs:    Medical: Not on file    Non-medical: Not on file  Tobacco Use  . Smoking  status: Former Smoker    Types: Cigarettes  . Smokeless tobacco: Never Used  . Tobacco comment: "smoked when I drank"  Substance and Sexual Activity  . 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 Drug use: Never  . Sexual activity: Not Currently  Lifestyle  . Physical activity:    Days per week: Not on file    Minutes per session: Not on file  . Stress: Not on file  Relationships  . Social connections:    Talks on phone: Not on file    Gets together: Not on file    Attends religious service: Not on file    Active member of club or organization: Not on file    Attends meetings of clubs or organizations: Not on file    Relationship status: Not on file  . Intimate partner violence:    Fear of current or ex partner: Not on file    Emotionally abused: Not on file    Physically abused: Not on file    Forced sexual activity: Not on file  Other Topics Concern  . Not on file  Social History Narrative   Patient lives at home with wife and some of his 10 children. He self-administers his own medications.    Physical Exam  Constitutional: He is oriented to person, place, and time.  Cardiovascular: Regular rhythm. Bradycardia present.  Pulmonary/Chest: Effort normal and breath sounds normal.  Abdominal: Soft.  Musculoskeletal: Normal range of motion. He exhibits edema.  Neurological: He is alert and oriented to person, place, and time.  Skin: Skin is warm and dry.  Psychiatric: He has a normal mood and affect.        Future Appointments  Date Time Provider Department Center  06/03/2018  8:35 AM [facility]  06/07/2018 12:00 PM Laurey Morale, MD [facility] None    BP 126/78 (BP Location: Left Arm, Patient Position: Sitting, Cuff Size: Large)   Pulse (!) 45   Resp 16   Wt 259 lb (117.5 kg)   SpO2 96%   BMI 29.93 kg/m   Weight yesterday- 259 lb Last visit weight- 254 lb  Mr  Woodrum was seen at home today and reported feeling generally well. He denied chest pain, SOB, headache, dizziness or orthopnea. He reported being compliant with his medications over the past week however he was out of several medications when I was attempting to verify them so I have concerns about whether or not he is taking everything. Additionally, there were pills in his pillbox and I did not see him last week since he did not return my  phone call, so I suspect he is not taking his medications regularly. His pillbox was refilled and all necessary medications were ordered from the pharmacy. Listed below is a list of the medications he need to pick up from the pharmacy and where they are needed in his box. He was given this information but I will follow up by the end of the week.   Allopurinol needed in Monday and Tuesday am Digoxin needed in Friday through Wednesday  Vitamin D needed in whole box  [name], EMT 05/08/18  ACTION: Home visit completed Next visit planned for 1 week

## 2018-05-09 NOTE — Telephone Encounter (Signed)
I called Richard [name] to schedule an appointment. He stated he would be home all day so I could come by "whenever." I advised I would be there around 14:00 and he was agreeable.

## 2018-05-16 ENCOUNTER — Telehealth (HOSPITAL_COMMUNITY): Payer: Self-pay

## 2018-05-16 ENCOUNTER — Other Ambulatory Visit (HOSPITAL_COMMUNITY): Payer: Self-pay

## 2018-05-16 NOTE — Progress Notes (Signed)
Paramedicine Encounter    Patient ID: Richard [name], male    DOB: [birth year], 57 y.o.   MRN: [id number]   Patient Care Team: Richard Pilgrim, MD as PCP - General (Family Medicine)  Patient Active Problem List   Diagnosis Date Noted  . Acute on chronic combined systolic and diastolic CHF (congestive heart failure) (HCC) 03/15/2018  . S/P right and left heart catheterization 11/02/2017  . Panic attacks 11/02/2017  . Non-STEMI (non-ST elevated myocardial infarction) (HCC)   . COPD (chronic obstructive pulmonary disease) (HCC)   . Hypertension   . CKD (chronic kidney disease)   . 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HCC)   . ICD (implantable cardioverter-defibrillator) in place   . GERD (gastroesophageal reflux disease)   . Diabetes mellitus with diabetic neuropathy, with long-term current use of insulin (HCC)     Current Outpatient Medications:  .  aspirin EC 81 MG EC tablet, Take 1 tablet (81 mg total) by mouth daily., Disp: 90 tablet, Rfl: 0 .  atorvastatin (LIPITOR) 40 MG tablet, Take 1 tablet (40 mg total) by mouth daily at 6 PM., Disp: 30 tablet, Rfl: 5 .  BREO ELLIPTA 100-25 MCG/INH AEPB, Inhale 1 puff into the lungs daily., Disp: , Rfl: 5 .  clopidogrel (PLAVIX) 75 MG tablet, Take 1 tablet (75 mg total) by mouth daily with breakfast., Disp: 11 tablet, Rfl: 0 .  DULoxetine (CYMBALTA) 60 MG capsule, Take 60 mg by mouth daily., Disp: , Rfl: 2 .  eplerenone (INSPRA) 25 MG tablet, Take 0.5 tablets (12.5 mg total) by mouth daily., Disp: 15 tablet, Rfl: 11 .  hydrALAZINE (APRESOLINE) 10 MG tablet, Take 1 tablet (10 mg total) by mouth 3 (three) times daily., Disp: 270 tablet, Rfl: 3 .  insulin regular human CONCENTRATED (HUMULIN R U-500 KWIKPEN) 500 UNIT/ML kwikpen, USE AS DIRECTED PER SLIDING SCALE. MAX DAILY DOSE IS 400 UNITS., Disp: , Rfl:  .  isosorbide dinitrate (ISORDIL) 10 MG tablet, Take 1 tablet (10 mg total) by mouth 3 (three) times daily., Disp: 90 tablet, Rfl: 3 .  MAGNESIUM-OXIDE 400  (241.3 Mg) MG tablet, Take 400 mg by mouth daily., Disp: , Rfl: 11 .  potassium chloride SA (K-DUR,KLOR-CON) 20 MEQ tablet, Take 1 tablet (20 mEq total) by mouth daily., Disp: 90 tablet, Rfl: 3 .  pregabalin (LYRICA) 100 MG capsule, Take 1 capsule (100 mg total) by mouth 2 (two) times daily., Disp: 60 capsule, Rfl: 3 .  sacubitril-valsartan (ENTRESTO) 49-51 MG, Take 1 tablet by mouth 2 (two) times daily., Disp: 60 tablet, Rfl: 11 .  torsemide (DEMADEX) 20 MG tablet, Take 2 tablets (40 mg total) by mouth daily., Disp: 60 tablet, Rfl: 11 .  Vitamin D, Cholecalciferol, 1000 units CAPS, Take 1,000 Units by mouth daily., Disp: , Rfl: 11 .  allopurinol (ZYLOPRIM) 100 MG tablet, Take 1 tablet (100 mg total) by mouth daily. (Patient not taking: Reported on 05/16/2018), Disp: 30 tablet, Rfl: 3 .  digoxin (LANOXIN) 0.125 MG tablet, Take 1 tablet (0.125 mg total) by mouth daily. (Patient not taking: Reported on 05/16/2018), Disp: 30 tablet, Rfl: 3 .  diphenhydrAMINE (BENADRYL) 25 MG tablet, Take 25 mg by mouth at bedtime as needed., Disp: , Rfl:  .  Insulin Pen Needle (NOVOFINE) 30G X 8 MM MISC, Inject 10 each into the skin as needed. (Patient not taking: Reported on 03/20/2018), Disp: 100 each, Rfl: 0 .  omeprazole (PRILOSEC) 20 MG capsule, Take 20 mg by mouth daily., Disp: , Rfl: 1 .  ranitidine (  ZANTAC) 150 MG tablet, Take 150 mg by mouth 2 (two) times daily., Disp: , Rfl:  Allergies  Allergen Reactions  . Codeine   . Coconut Oil Itching      Social History   Socioeconomic History  . Marital status: Divorced    Spouse name: Not on file  . Number of children: Not on file  . Years of education: Not on file  . Highest education level: Not on file  Occupational History  . Occupation: disability    Comment: stopped working in 2000 d/t occupational exposures  Social Needs  . Financial resource strain: Not on file  . Food insecurity:    Worry: Not on file    Inability: Not on file  .  Transportation needs:    Medical: Not on file    Non-medical: Not on file  Tobacco Use  . Smoking status: Former Smoker    Types: Cigarettes  . Smokeless tobacco: Never Used  . Tobacco comment: "smoked when I drank"  Substance and Sexual Activity  . 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 Drug use: Never  . Sexual activity: Not Currently  Lifestyle  . Physical activity:    Days per week: Not on file    Minutes per session: Not on file  . Stress: Not on file  Relationships  . Social connections:    Talks on phone: Not on file    Gets together: Not on file    Attends religious service: Not on file    Active member of club or organization: Not on file    Attends meetings of clubs or organizations: Not on file    Relationship status: Not on file  . Intimate partner violence:    Fear of current or ex partner: Not on file    Emotionally abused: Not on file    Physically abused: Not on file    Forced sexual activity: Not on file  Other Topics Concern  . Not on file  Social History Narrative   Patient lives at home with wife and some of his 10 children. He self-administers his own medications.    Physical Exam  Constitutional: He is oriented to person, place, and time.  Cardiovascular: Normal rate and regular rhythm.  Pulmonary/Chest: Effort normal and breath sounds normal.  Abdominal: He exhibits distension.  Musculoskeletal: Normal range of motion. He exhibits no edema.  Neurological: He is alert and oriented to person, place, and time.  Skin: Skin is warm and dry.  Psychiatric: He has a normal mood and affect.        Future Appointments  Date Time Provider Department Center  06/03/2018  8:35 AM [facility]  06/07/2018 12:00 PM Richard Morale, MD [facility] None    BP (!) 130/100 (BP Location: Left Arm, Patient Position: Sitting, Cuff Size: Large)   Pulse 90   Resp 18    Wt 257 lb (116.6 kg)   SpO2 93%   BMI 29.70 kg/m   Weight yesterday- 257 lb Last visit weight- 259 lb  Richard [name] was seen at home today and reported feeling unwell. He complained of abdominal discomfort and associated nausea. He said this has been experiencing these symptoms for the past two days and advised he had not had an appreciable bowel movement in three days. His abdomen was significantly distended so I instructed him to get OTC stool softener and take as directed  on the label to see if that helps and contact me if he continues to have swelling or discomfort. I also told him to cal 911 if his symptoms worsened or his breathing became labored. He was understanding and agreeable. He stated he has been taking his medications as directed however he had not picked up several medications from the pharmacy since last week. I verified his medications and refilled his pillbox was the available pills. I contacted the pharmacy and confirmed that the necessary medications were ready to be picked up and gave Richard Vosler detailed written instructions on where he should put the medications when he picks them up from the pharmacy. The missing medications are listed below.  Out of: Allopurinol, Digoxin, Lyrica, Omeprazole, Zantac, Benadryl  [name], EMT 05/16/18  ACTION: Home visit completed Next visit planned for 1 week

## 2018-05-16 NOTE — Telephone Encounter (Signed)
Richard [name] returned my call and asked me if I could come at 15:00 on Thursday because he has an appointment before that. This time worked well for me so we confirmed the appointment.

## 2018-05-16 NOTE — Telephone Encounter (Signed)
I called Mr Richard [name] to schedule abn appointment. He did not answer so I left a voicemail requesting a call back.

## 2018-05-17 ENCOUNTER — Telehealth (HOSPITAL_COMMUNITY): Payer: Self-pay

## 2018-05-17 NOTE — Telephone Encounter (Signed)
Error

## 2018-05-20 ENCOUNTER — Telehealth (HOSPITAL_COMMUNITY): Payer: Self-pay

## 2018-05-20 ENCOUNTER — Other Ambulatory Visit (HOSPITAL_COMMUNITY): Payer: Self-pay

## 2018-05-20 MED ORDER — ISOSORBIDE DINITRATE 10 MG PO TABS: 10.0000 mg | ORAL_TABLET | Freq: Three times a day (TID) | ORAL | 3 refills | Status: DC

## 2018-05-20 NOTE — Telephone Encounter (Signed)
I called Richard [name] to schedule an appointment. He did not answer the phone so I left a message requesting he call me back.

## 2018-05-29 ENCOUNTER — Telehealth (HOSPITAL_COMMUNITY): Payer: Self-pay

## 2018-05-29 NOTE — Telephone Encounter (Signed)
I called Richard [name] to schedule an appointment. He stated he would be able to meet on Friday after lunch so I advised I would call him that day to firm up a time. He was understanding and agreeable.

## 2018-05-31 ENCOUNTER — Telehealth (HOSPITAL_COMMUNITY): Payer: Self-pay

## 2018-05-31 NOTE — Telephone Encounter (Signed)
I called Mr Richard [name] to remind him of our visit today. He did not answer so, since we spoke earlier in the week to plan a visit, I went to his house. I knocked on the door multiple times and loudly. Nobody answered the door and it appeared to be dark in the house with multiple blinds closed. I will follow up next week.

## 2018-06-03 ENCOUNTER — Ambulatory Visit (INDEPENDENT_AMBULATORY_CARE_PROVIDER_SITE_OTHER): Payer: Medicare Other

## 2018-06-03 DIAGNOSIS — I5042 Chronic combined systolic (congestive) and diastolic (congestive) heart failure: Secondary | ICD-10-CM | POA: Diagnosis not present

## 2018-06-03 DIAGNOSIS — I428 Other cardiomyopathies: Secondary | ICD-10-CM

## 2018-06-04 NOTE — Progress Notes (Signed)
Remote ICD transmission.   

## 2018-06-05 ENCOUNTER — Other Ambulatory Visit (HOSPITAL_COMMUNITY): Payer: Self-pay

## 2018-06-05 NOTE — Progress Notes (Signed)
Paramedicine Encounter    Patient ID: Richard [name], male    DOB: 07-17-1960, 57 y.o.   MRN: [id number]   Patient Care Team: Ananias Pilgrim, MD as PCP - General (Family Medicine) Burna Sis, LCSW as Social Worker (Licensed Clinical Social Worker)  Patient Active Problem List   Diagnosis Date Noted  . Acute on chronic combined systolic and diastolic CHF (congestive heart failure) (HCC) 03/15/2018  . S/P right and left heart catheterization 11/02/2017  . Panic attacks 11/02/2017  . Non-STEMI (non-ST elevated myocardial infarction) (HCC)   . COPD (chronic obstructive pulmonary disease) (HCC)   . Hypertension   . CKD (chronic kidney disease)   . 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HCC)   . ICD (implantable cardioverter-defibrillator) in place   . GERD (gastroesophageal reflux disease)   . Diabetes mellitus with diabetic neuropathy, with long-term current use of insulin (HCC)     Current Outpatient Medications:  .  aspirin EC 81 MG EC tablet, Take 1 tablet (81 mg total) by mouth daily., Disp: 90 tablet, Rfl: 0 .  atorvastatin (LIPITOR) 40 MG tablet, Take 1 tablet (40 mg total) by mouth daily at 6 PM., Disp: 30 tablet, Rfl: 5 .  BREO ELLIPTA 100-25 MCG/INH AEPB, Inhale 1 puff into the lungs daily., Disp: , Rfl: 5 .  DULoxetine (CYMBALTA) 60 MG capsule, Take 60 mg by mouth daily., Disp: , Rfl: 2 .  eplerenone (INSPRA) 25 MG tablet, Take 0.5 tablets (12.5 mg total) by mouth daily., Disp: 15 tablet, Rfl: 11 .  hydrALAZINE (APRESOLINE) 10 MG tablet, Take 1 tablet (10 mg total) by mouth 3 (three) times daily., Disp: 270 tablet, Rfl: 3 .  insulin regular human CONCENTRATED (HUMULIN R U-500 KWIKPEN) 500 UNIT/ML kwikpen, USE AS DIRECTED PER SLIDING SCALE. MAX DAILY DOSE IS 400 UNITS., Disp: , Rfl:  .  isosorbide dinitrate (ISORDIL) 10 MG tablet, Take 1 tablet (10 mg total) by mouth 3 (three) times daily., Disp: 90 tablet, Rfl: 3 .  MAGNESIUM-OXIDE 400 (241.3 Mg) MG tablet, Take 400 mg by mouth daily.,  Disp: , Rfl: 11 .  omeprazole (PRILOSEC) 20 MG capsule, Take 20 mg by mouth daily., Disp: , Rfl: 1 .  potassium chloride SA (K-DUR,KLOR-CON) 20 MEQ tablet, Take 1 tablet (20 mEq total) by mouth daily., Disp: 90 tablet, Rfl: 3 .  pregabalin (LYRICA) 100 MG capsule, Take 1 capsule (100 mg total) by mouth 2 (two) times daily., Disp: 60 capsule, Rfl: 3 .  sacubitril-valsartan (ENTRESTO) 49-51 MG, Take 1 tablet by mouth 2 (two) times daily., Disp: 60 tablet, Rfl: 11 .  torsemide (DEMADEX) 20 MG tablet, Take 2 tablets (40 mg total) by mouth daily., Disp: 60 tablet, Rfl: 11 .  Vitamin D, Cholecalciferol, 1000 units CAPS, Take 1,000 Units by mouth daily., Disp: , Rfl: 11 .  allopurinol (ZYLOPRIM) 100 MG tablet, Take 1 tablet (100 mg total) by mouth daily. (Patient not taking: Reported on 05/16/2018), Disp: 30 tablet, Rfl: 3 .  clopidogrel (PLAVIX) 75 MG tablet, Take 1 tablet (75 mg total) by mouth daily with breakfast., Disp: 11 tablet, Rfl: 0 .  digoxin (LANOXIN) 0.125 MG tablet, Take 1 tablet (0.125 mg total) by mouth daily. (Patient not taking: Reported on 05/16/2018), Disp: 30 tablet, Rfl: 3 .  diphenhydrAMINE (BENADRYL) 25 MG tablet, Take 25 mg by mouth at bedtime as needed., Disp: , Rfl:  .  Insulin Pen Needle (NOVOFINE) 30G X 8 MM MISC, Inject 10 each into the skin as needed. (Patient not taking:  Reported on 03/20/2018), Disp: 100 each, Rfl: 0 .  ranitidine (ZANTAC) 150 MG tablet, Take 150 mg by mouth 2 (two) times daily., Disp: , Rfl:  Allergies  Allergen Reactions  . Codeine   . Coconut Oil Itching      Social History   Socioeconomic History  . Marital status: Divorced    Spouse name: Not on file  . Number of children: Not on file  . Years of education: Not on file  . Highest education level: Not on file  Occupational History  . Occupation: disability    Comment: stopped working in 2000 d/t occupational exposures  Social Needs  . Financial resource strain: Not on file  . Food  insecurity:    Worry: Not on file    Inability: Not on file  . Transportation needs:    Medical: Not on file    Non-medical: Not on file  Tobacco Use  . Smoking status: Former Smoker    Types: Cigarettes  . Smokeless tobacco: Never Used  . Tobacco comment: "smoked when I drank"  Substance and Sexual Activity  . 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 Drug use: Never  . Sexual activity: Not Currently  Lifestyle  . Physical activity:    Days per week: Not on file    Minutes per session: Not on file  . Stress: Not on file  Relationships  . Social connections:    Talks on phone: Not on file    Gets together: Not on file    Attends religious service: Not on file    Active member of club or organization: Not on file    Attends meetings of clubs or organizations: Not on file    Relationship status: Not on file  . Intimate partner violence:    Fear of current or ex partner: Not on file    Emotionally abused: Not on file    Physically abused: Not on file    Forced sexual activity: Not on file  Other Topics Concern  . Not on file  Social History Narrative   Patient lives at home with wife and some of his 10 children. He self-administers his own medications.    Physical Exam  Constitutional: He is oriented to person, place, and time.  Cardiovascular: Normal rate and regular rhythm.  Pulmonary/Chest: Effort normal and breath sounds normal.  Abdominal: He exhibits distension.  Musculoskeletal: Normal range of motion. He exhibits edema.  Neurological: He is alert and oriented to person, place, and time.  Skin: Skin is warm and dry.  Psychiatric: He has a normal mood and affect.        Future Appointments  Date Time Provider Department Center  06/07/2018 12:00 PM Laurey Morale, MD [facility] None  09/02/2018  8:05 AM [facility]    BP 124/84 (BP Location: Left Arm, Patient  Position: Sitting, Cuff Size: Normal)   Pulse 60   Resp 16   Wt 260 lb (117.9 kg)   SpO2 90%   BMI 30.05 kg/m   Weight yesterday- 261 lb Last visit weight-   Mr Loren was seen at home today and reported feeling well. He denied chest pain, SOB, headache, dizziness or orthopnea. His only complaint at this point was irregular bowel movements. He stated he does from having intense diarrhea to constipation despite not having a dietary change. I asked if he had spoken to his PCP  about this and he had not. I suggested he do this sooner rather than later since it is causing him distress. His medications were verified and his pillbox was refilled. He was still out of digoxin and allopurinol so I contacted the pharmacy and had both refilled since he did not pick them up last time.   [name], EMT 06/05/18  ACTION: Home visit completed Next visit planned for 1 week

## 2018-06-07 ENCOUNTER — Ambulatory Visit (HOSPITAL_COMMUNITY)
Admission: RE | Admit: 2018-06-07 | Discharge: 2018-06-07 | Disposition: A | Discharge status: Home / Self Care | Payer: Medicare Other | Source: Ambulatory Visit | Attending: Cardiology | Admitting: Cardiology

## 2018-06-07 ENCOUNTER — Encounter (HOSPITAL_COMMUNITY): Payer: Self-pay | Admitting: Cardiology

## 2018-06-07 ENCOUNTER — Encounter (HOSPITAL_COMMUNITY): Payer: Medicare Other | Admitting: Cardiology

## 2018-06-07 ENCOUNTER — Other Ambulatory Visit (HOSPITAL_COMMUNITY): Payer: Self-pay | Admitting: Cardiology

## 2018-06-07 VITALS — BP 150/90 | HR 76 | Wt 270.6 lb

## 2018-06-07 DIAGNOSIS — Z87891 Personal history of nicotine dependence: Secondary | ICD-10-CM | POA: Diagnosis not present

## 2018-06-07 DIAGNOSIS — I5022 Chronic systolic (congestive) heart failure: Secondary | ICD-10-CM | POA: Insufficient documentation

## 2018-06-07 DIAGNOSIS — I110 Hypertensive heart disease with heart failure: Secondary | ICD-10-CM | POA: Diagnosis present

## 2018-06-07 DIAGNOSIS — I2510 Atherosclerotic heart disease of native coronary artery without angina pectoris: Secondary | ICD-10-CM

## 2018-06-07 DIAGNOSIS — Z833 Family history of diabetes mellitus: Secondary | ICD-10-CM | POA: Diagnosis not present

## 2018-06-07 DIAGNOSIS — Z794 Long term (current) use of insulin: Secondary | ICD-10-CM | POA: Insufficient documentation

## 2018-06-07 DIAGNOSIS — D751 Secondary polycythemia: Secondary | ICD-10-CM | POA: Insufficient documentation

## 2018-06-07 DIAGNOSIS — E785 Hyperlipidemia, unspecified: Secondary | ICD-10-CM | POA: Diagnosis not present

## 2018-06-07 DIAGNOSIS — K219 Gastro-esophageal reflux disease without esophagitis: Secondary | ICD-10-CM | POA: Diagnosis not present

## 2018-06-07 DIAGNOSIS — G4733 Obstructive sleep apnea (adult) (pediatric): Secondary | ICD-10-CM | POA: Insufficient documentation

## 2018-06-07 DIAGNOSIS — Z7902 Long term (current) use of antithrombotics/antiplatelets: Secondary | ICD-10-CM | POA: Diagnosis not present

## 2018-06-07 DIAGNOSIS — J441 Chronic obstructive pulmonary disease with (acute) exacerbation: Secondary | ICD-10-CM | POA: Diagnosis not present

## 2018-06-07 DIAGNOSIS — I428 Other cardiomyopathies: Secondary | ICD-10-CM | POA: Diagnosis not present

## 2018-06-07 DIAGNOSIS — E119 Type 2 diabetes mellitus without complications: Secondary | ICD-10-CM | POA: Insufficient documentation

## 2018-06-07 DIAGNOSIS — Z7982 Long term (current) use of aspirin: Secondary | ICD-10-CM | POA: Insufficient documentation

## 2018-06-07 DIAGNOSIS — Z8249 Family history of ischemic heart disease and other diseases of the circulatory system: Secondary | ICD-10-CM | POA: Diagnosis not present

## 2018-06-07 DIAGNOSIS — Z9981 Dependence on supplemental oxygen: Secondary | ICD-10-CM | POA: Insufficient documentation

## 2018-06-07 LAB — BASIC METABOLIC PANEL
Anion gap: 10 (ref 5–15)
BUN: 17 mg/dL (ref 6–20)
CHLORIDE: 95 mmol/L — AB (ref 98–111)
CO2: 37 mmol/L — AB (ref 22–32)
Calcium: 8.7 mg/dL — ABNORMAL LOW (ref 8.9–10.3)
Creatinine, Ser: 1.25 mg/dL — ABNORMAL HIGH (ref 0.61–1.24)
GFR calc Af Amer: >60 mL/min (ref >60)
GFR calc non Af Amer: >60 mL/min (ref >60)
Glucose, Bld: 156 mg/dL — ABNORMAL HIGH (ref 70–99)
Potassium: 3.9 mmol/L (ref 3.5–5.1)
Sodium: 142 mmol/L (ref 135–145)

## 2018-06-07 LAB — LIPID PANEL
Cholesterol: 124 mg/dL (ref 0–200)
HDL: 28 mg/dL — ABNORMAL LOW (ref >40)
LDL Cholesterol: 48 mg/dL (ref 0–99)
Total CHOL/HDL Ratio: 4.4 RATIO
Triglycerides: 239 mg/dL — ABNORMAL HIGH (ref <150)
VLDL: 48 mg/dL — ABNORMAL HIGH (ref 0–40)

## 2018-06-07 LAB — DIGOXIN LEVEL: Digoxin Level: <0.2 ng/mL — ABNORMAL LOW (ref 0.8–2.0)

## 2018-06-07 MED ORDER — ISOSORBIDE DINITRATE 20 MG PO TABS: 20.0000 mg | ORAL_TABLET | Freq: Three times a day (TID) | ORAL | 6 refills | Status: DC

## 2018-06-07 MED ORDER — HYDRALAZINE HCL 25 MG PO TABS: 25.0000 mg | ORAL_TABLET | Freq: Three times a day (TID) | ORAL | 3 refills | Status: DC

## 2018-06-07 MED ORDER — EPLERENONE 25 MG PO TABS: 25.0000 mg | ORAL_TABLET | Freq: Every day | ORAL | 6 refills | Status: DC

## 2018-06-07 MED ORDER — TORSEMIDE 20 MG PO TABS: ORAL_TABLET | ORAL | 6 refills | Status: DC

## 2018-06-07 NOTE — Patient Instructions (Addendum)
INCREASE Hydralazine to 25mg  (1 tab) three times a day  INCREASE Isordil to 20mg  (1 tab) three times a day  INCREASE Eplerenone to 25mg  daily  INCREASE Torsemide to 40mg  (2 tabs) twice a day for 4 days, THEN 40mg  (2 tabs) in the morning and 20mg  (1 tab)  in the evening after that  Labs today We will only contact you if something comes back abnormal or we need to make some changes. Otherwise no news is good news!  Repeat labs in 10 days  Your physician recommends that you schedule a follow-up appointment in: 2 weeks with NP/PA  Your physician recommends that you schedule a follow-up appointment in: 6 weeks with. Dr. Shirlee Latch and an ECHO  Your physician has requested that you have an echocardiogram. Echocardiography is a painless test that uses sound waves to create images of your heart. It provides your doctor with information about the size and shape of your heart and how well your heart's chambers and valves are working. This procedure takes approximately one hour. There are no restrictions for this procedure.

## 2018-06-07 NOTE — Progress Notes (Signed)
LM for EMT Delle Reining as there were med changes to patients med list.

## 2018-06-09 NOTE — Progress Notes (Signed)
Advanced [facility] Failure Clinic Note   PCP: Ananias Pilgrim, MD PCP-Cardiologist: Dr Shirlee Latch  HPI: Richard [name] is a 57 y.o. male with a history of chronic systolic HF s/p Medtronic ICD (12/2016), HTN, DM, and HLD.   Admitted 5/8-5/14/19 with A/C systolic HF. Echo showed EF 15-20%. Advanced HF team was consulted. He diuresed 20 lbs with IV lasix, then transitioned to torsemide 40 mg daily. Underwent R/LHC showing a focal severe RCA stenosis treated with DES.  He did not, however, appear to have severe enough coronary disease to explain his cardiomyopathy. Cardiac index was low at 1.9.  HF meds were optimized. Beta blocker was not started due to low CI and soft BPs. He was discharged on home oxygen. Referred to cardiac rehab. DC weight: 260 lbs.   He presents today for followup of CHF.  Recently took prednisone for 2 wks with COPD excerbation and ate a lot more, weight is up.  He is not short of breath walking on flat ground or up a flight of stairs.  No chest pain.  No orthopnea/PND.  He is not sure about his medications.  Paramedicine is following to help him with meds.   Labs (10/19): K 3.9, creatinine 1.18  ECG (personally reviewed): NSR, IVCD 126 msec, anterior Qs  PMH: 1. Chronic systolic CHF: Primarily nonischemic cardiomyopathy.  Has Medtronic ICD.   - Echo 5/19: EF 15-20%, grade 2 DD, mild MR, RV severely reduced, LA moderately dilated, RA mildly dilated, mild TR.  - RHC/LHC (5/19): 80% distal LAD stenosis, 80% proximal RCA stenosis treated with DES to pRCA. Mean RA 13, PA 69/31 mean 47, mean 24, CI 1.91, PVR 4.8 WU.  - Gynecomastia with spironolactone. 2. COPD: Uses oxygen at night. Never smoked, but apparently had significant occupational exposure. It appears that he won a lawsuit dealing with the occupational exposure-related COPD.  3. CAD: Cath in 5/17 with 60% RCA stenosis.  - LHC (5/19): 80% distal LAD, 80% proximal RCA.  DES to proximal RCA.  4. Polycythemia: Probably due to  chronic hypoxemia.  5. Type II diabetes.  6. HTN 7. GERD  Review of systems complete and found to be negative unless listed in HPI.    Current Outpatient Medications  Medication Sig Dispense Refill  . allopurinol (ZYLOPRIM) 100 MG tablet Take 1 tablet (100 mg total) by mouth daily. 30 tablet 3  . aspirin EC 81 MG EC tablet Take 1 tablet (81 mg total) by mouth daily. 90 tablet 0  . atorvastatin (LIPITOR) 40 MG tablet Take 1 tablet (40 mg total) by mouth daily at 6 PM. 30 tablet 5  . BREO ELLIPTA 100-25 MCG/INH AEPB Inhale 1 puff into the lungs daily.  5  . clopidogrel (PLAVIX) 75 MG tablet Take 1 tablet (75 mg total) by mouth daily with breakfast. 11 tablet 0  . digoxin (LANOXIN) 0.125 MG tablet Take 1 tablet (0.125 mg total) by mouth daily. 30 tablet 3  . diphenhydrAMINE (BENADRYL) 25 MG tablet Take 25 mg by mouth at bedtime as needed.    . DULoxetine (CYMBALTA) 60 MG capsule Take 60 mg by mouth daily.  2  . empagliflozin (JARDIANCE) 10 MG TABS tablet Take 10 mg by mouth daily.    Marland Kitchen eplerenone (INSPRA) 25 MG tablet Take 1 tablet (25 mg total) by mouth daily. 30 tablet 6  . hydrALAZINE (APRESOLINE) 25 MG tablet Take 1 tablet (25 mg total) by mouth 3 (three) times daily. 270 tablet 3  . Insulin Pen Needle (NOVOFINE)  30G X 8 MM MISC Inject 10 each into the skin as needed. 100 each 0  . insulin regular human CONCENTRATED (HUMULIN R U-500 KWIKPEN) 500 UNIT/ML kwikpen USE AS DIRECTED PER SLIDING SCALE. MAX DAILY DOSE IS 400 UNITS.    Marland Kitchen isosorbide dinitrate (ISORDIL) 20 MG tablet Take 1 tablet (20 mg total) by mouth 3 (three) times daily. 30 tablet 6  . MAGNESIUM-OXIDE 400 (241.3 Mg) MG tablet Take 400 mg by mouth daily.  11  . omeprazole (PRILOSEC) 20 MG capsule Take 20 mg by mouth daily.  1  . potassium chloride SA (K-DUR,KLOR-CON) 20 MEQ tablet Take 1 tablet (20 mEq total) by mouth daily. 90 tablet 3  . pregabalin (LYRICA) 100 MG capsule Take 1 capsule (100 mg total) by mouth 2 (two) times  daily. 60 capsule 3  . ranitidine (ZANTAC) 150 MG tablet Take 150 mg by mouth 2 (two) times daily.    . sacubitril-valsartan (ENTRESTO) 49-51 MG Take 1 tablet by mouth 2 (two) times daily. 60 tablet 11  . torsemide (DEMADEX) 20 MG tablet Take 2 tablets (40 mg total) by mouth every morning AND 1 tablet (20 mg total) every evening. 90 tablet 6  . Vitamin D, Cholecalciferol, 1000 units CAPS Take 1,000 Units by mouth daily.  11   No current facility-administered medications for this encounter.    Allergies  Allergen Reactions  . Codeine   . Coconut Oil Itching    Social History   Socioeconomic History  . Marital status: Divorced    Spouse name: Not on file  . Number of children: Not on file  . Years of education: Not on file  . Highest education level: Not on file  Occupational History  . Occupation: disability    Comment: stopped working in 2000 d/t occupational exposures  Social Needs  . Financial resource strain: Not on file  . Food insecurity:    Worry: Not on file    Inability: Not on file  . Transportation needs:    Medical: Not on file    Non-medical: Not on file  Tobacco Use  . Smoking status: Former Smoker    Types: Cigarettes  . Smokeless tobacco: Never Used  . Tobacco comment: "smoked when I drank"  Substance and Sexual Activity  . 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 Drug use: Never  . Sexual activity: Not Currently  Lifestyle  . Physical activity:    Days per week: Not on file    Minutes per session: Not on file  . Stress: Not on file  Relationships  . Social connections:    Talks on phone: Not on file    Gets together: Not on file    Attends religious service: Not on file    Active member of club or organization: Not on file    Attends meetings of clubs or organizations: Not on file    Relationship status: Not on file  . Intimate partner violence:    Fear of current or ex partner: Not on file     Emotionally abused: Not on file    Physically abused: Not on file    Forced sexual activity: Not on file  Other Topics Concern  . Not on file  Social History Narrative   Patient lives at home with wife and some of his 10 children. He self-administers his own medications.   Family History  Problem Relation Age of Onset  .  Hypertension Mother   . Diabetes Mother   . Hypertension Father   . Diabetes Father   . Diabetes Sister   . Diabetes Brother    Vitals:   06/07/18 1354 06/07/18 1355  BP: (!) 162/90 (!) 150/90  Pulse: 76   SpO2: 90%   Weight: 122.7 kg (270 lb 9.6 oz)      Wt Readings from Last 3 Encounters:  06/07/18 122.7 kg (270 lb 9.6 oz)  06/05/18 117.9 kg (260 lb)  05/16/18 116.6 kg (257 lb)   PHYSICAL EXAM: General: NAD Neck: JVP 8-9 cm, no thyromegaly or thyroid nodule.  Lungs: Clear to auscultation bilaterally with normal respiratory effort. CV: Nondisplaced PMI.  Heart regular S1/S2, no S3/S4, no murmur.  1+ edema to knees bilaterally.  No carotid bruit.  Normal pedal pulses.  Abdomen: Soft, nontender, no hepatosplenomegaly, no distention.  Skin: Intact without lesions or rashes.  Neurologic: Alert and oriented x 3.  Psych: Normal affect. Extremities: No clubbing or cyanosis.  HEENT: Normal.   ASSESSMENT & PLAN:  1. Chronic systolic CHF:  Primarily nonischemic cardiomyopathy. Echo in 2017 with EF 15-20%. Medtronic ICD. Echo 10/2017 EF 15-20%, severely dilated RV with severely decreased systolic function. LHC/RHC 11/06/17 showed volume overload with 80% RCA stenosis.  Cardiac index low at 1.91. The degree of coronary disease does not explain his cardiomyopathy.  NYHA class II symptoms but he is volume overloaded on exam.  - Increase torsemide to 40 mg bid x 4 days then 40 qam/20 qpm. BMET today and in 10 days.   - Continue entresto 49/51 mg BID.  - Increase eplerenone to 25 mg daily.   - Increase hydralazine to 25 mg tid and isordil to 20 tid.   -  Continue digoxin 0.125 mg daily, check level today.  - Start beta blocker when volume is better-controlled. Will use bisoprolol (more beta-1 selective in setting of COPD.     2. COPD: Never smoked, but apparently had significant occupational exposure. It appears that he won a lawsuit dealing with the occupational exposure-related COPD.  - Continue 2 liters oxygen.  3. CAD:  LHC in 5/19 with 80-90% proximal RCA stenosis treated with DES to RCA.  This did not cause his cardiomyopathy but is a large RCA.  No chest pain.  - Continue ASA 81 daily.  - Continue atorvastatin  - Continue Plavix x 1 year post-PCI.  4. Polycythemia: Likely related to chronic hypoxia.  - Refuses sleep study.  5. OSA: Refuses sleep study 6. DM: He is on empagliflozin per his report though not on med list.    Followup in 6 wks with echo with me but will return in 2 wks for volume check with NP/PA.  Continue paramedicine. I am no sure is taking all his medicines as ordered.   Marca Ancona 06/09/2018

## 2018-06-09 NOTE — Progress Notes (Signed)
Advanced Heart Failure Clinic Note   PCP: Asres, Alehegn, MD PCP-Cardiologist: Dr Michai Dieppa  HPI: Richard [name] is a 57 y.o. male with a history of chronic systolic HF s/p Medtronic ICD (12/2016), HTN, DM, and HLD.   Admitted 5/8-5/14/19 with A/C systolic HF. Echo showed EF 15-20%. Advanced HF team was consulted. He diuresed 20 lbs with IV lasix, then transitioned to torsemide 40 mg daily. Underwent R/LHC showing a focal severe RCA stenosis treated with DES.  He did not, however, appear to have severe enough coronary disease to explain his cardiomyopathy. Cardiac index was low at 1.9.  HF meds were optimized. Beta blocker was not started due to low CI and soft BPs. He was discharged on home oxygen. Referred to cardiac rehab. DC weight: 260 lbs.   He presents today for followup of CHF.  Recently took prednisone for 2 wks with COPD excerbation and ate a lot more, weight is up.  He is not short of breath walking on flat ground or up a flight of stairs.  No chest pain.  No orthopnea/PND.  He is not sure about his medications.  Paramedicine is following to help him with meds.   Labs (10/19): K 3.9, creatinine 1.18  ECG (personally reviewed): NSR, IVCD 126 msec, anterior Qs  PMH: 1. Chronic systolic CHF: Primarily nonischemic cardiomyopathy.  Has Medtronic ICD.   - Echo 5/19: EF 15-20%, grade 2 DD, mild MR, RV severely reduced, LA moderately dilated, RA mildly dilated, mild TR.  - RHC/LHC (5/19): 80% distal LAD stenosis, 80% proximal RCA stenosis treated with DES to pRCA. Mean RA 13, PA 69/31 mean 47, mean 24, CI 1.91, PVR 4.8 WU.  - Gynecomastia with spironolactone. 2. COPD: Uses oxygen at night. Never smoked, but apparently had significant occupational exposure. It appears that he won a lawsuit dealing with the occupational exposure-related COPD.  3. CAD: Cath in 5/17 with 60% RCA stenosis.  - LHC (5/19): 80% distal LAD, 80% proximal RCA.  DES to proximal RCA.  4. Polycythemia: Probably due to  chronic hypoxemia.  5. Type II diabetes.  6. HTN 7. GERD  Review of systems complete and found to be negative unless listed in HPI.    Current Outpatient Medications  Medication Sig Dispense Refill  . allopurinol (ZYLOPRIM) 100 MG tablet Take 1 tablet (100 mg total) by mouth daily. 30 tablet 3  . aspirin EC 81 MG EC tablet Take 1 tablet (81 mg total) by mouth daily. 90 tablet 0  . atorvastatin (LIPITOR) 40 MG tablet Take 1 tablet (40 mg total) by mouth daily at 6 PM. 30 tablet 5  . BREO ELLIPTA 100-25 MCG/INH AEPB Inhale 1 puff into the lungs daily.  5  . clopidogrel (PLAVIX) 75 MG tablet Take 1 tablet (75 mg total) by mouth daily with breakfast. 11 tablet 0  . digoxin (LANOXIN) 0.125 MG tablet Take 1 tablet (0.125 mg total) by mouth daily. 30 tablet 3  . diphenhydrAMINE (BENADRYL) 25 MG tablet Take 25 mg by mouth at bedtime as needed.    . DULoxetine (CYMBALTA) 60 MG capsule Take 60 mg by mouth daily.  2  . empagliflozin (JARDIANCE) 10 MG TABS tablet Take 10 mg by mouth daily.    . eplerenone (INSPRA) 25 MG tablet Take 1 tablet (25 mg total) by mouth daily. 30 tablet 6  . hydrALAZINE (APRESOLINE) 25 MG tablet Take 1 tablet (25 mg total) by mouth 3 (three) times daily. 270 tablet 3  . Insulin Pen Needle (NOVOFINE)   30G X 8 MM MISC Inject 10 each into the skin as needed. 100 each 0  . insulin regular human CONCENTRATED (HUMULIN R U-500 KWIKPEN) 500 UNIT/ML kwikpen USE AS DIRECTED PER SLIDING SCALE. MAX DAILY DOSE IS 400 UNITS.    . isosorbide dinitrate (ISORDIL) 20 MG tablet Take 1 tablet (20 mg total) by mouth 3 (three) times daily. 30 tablet 6  . MAGNESIUM-OXIDE 400 (241.3 Mg) MG tablet Take 400 mg by mouth daily.  11  . omeprazole (PRILOSEC) 20 MG capsule Take 20 mg by mouth daily.  1  . potassium chloride SA (K-DUR,KLOR-CON) 20 MEQ tablet Take 1 tablet (20 mEq total) by mouth daily. 90 tablet 3  . pregabalin (LYRICA) 100 MG capsule Take 1 capsule (100 mg total) by mouth 2 (two) times  daily. 60 capsule 3  . ranitidine (ZANTAC) 150 MG tablet Take 150 mg by mouth 2 (two) times daily.    . sacubitril-valsartan (ENTRESTO) 49-51 MG Take 1 tablet by mouth 2 (two) times daily. 60 tablet 11  . torsemide (DEMADEX) 20 MG tablet Take 2 tablets (40 mg total) by mouth every morning AND 1 tablet (20 mg total) every evening. 90 tablet 6  . Vitamin D, Cholecalciferol, 1000 units CAPS Take 1,000 Units by mouth daily.  11   No current facility-administered medications for this encounter.    Allergies  Allergen Reactions  . Codeine   . Coconut Oil Itching    Social History   Socioeconomic History  . Marital status: Divorced    Spouse name: Not on file  . Number of children: Not on file  . Years of education: Not on file  . Highest education level: Not on file  Occupational History  . Occupation: disability    Comment: stopped working in 2000 d/t occupational exposures  Social Needs  . Financial resource strain: Not on file  . Food insecurity:    Worry: Not on file    Inability: Not on file  . Transportation needs:    Medical: Not on file    Non-medical: Not on file  Tobacco Use  . Smoking status: Former Smoker    Types: Cigarettes  . Smokeless tobacco: Never Used  . Tobacco comment: "smoked when I drank"  Substance and Sexual Activity  . 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 Drug use: Never  . Sexual activity: Not Currently  Lifestyle  . Physical activity:    Days per week: Not on file    Minutes per session: Not on file  . Stress: Not on file  Relationships  . Social connections:    Talks on phone: Not on file    Gets together: Not on file    Attends religious service: Not on file    Active member of club or organization: Not on file    Attends meetings of clubs or organizations: Not on file    Relationship status: Not on file  . Intimate partner violence:    Fear of current or ex partner: Not on file     Emotionally abused: Not on file    Physically abused: Not on file    Forced sexual activity: Not on file  Other Topics Concern  . Not on file  Social History Narrative   Patient lives at home with wife and some of his 10 children. He self-administers his own medications.   Family History  Problem Relation Age of Onset  .   Hypertension Mother   . Diabetes Mother   . Hypertension Father   . Diabetes Father   . Diabetes Sister   . Diabetes Brother    Vitals:   06/07/18 1354 06/07/18 1355  BP: (!) 162/90 (!) 150/90  Pulse: 76   SpO2: 90%   Weight: 122.7 kg (270 lb 9.6 oz)      Wt Readings from Last 3 Encounters:  06/07/18 122.7 kg (270 lb 9.6 oz)  06/05/18 117.9 kg (260 lb)  05/16/18 116.6 kg (257 lb)   PHYSICAL EXAM: General: NAD Neck: JVP 8-9 cm, no thyromegaly or thyroid nodule.  Lungs: Clear to auscultation bilaterally with normal respiratory effort. CV: Nondisplaced PMI.  Heart regular S1/S2, no S3/S4, no murmur.  1+ edema to knees bilaterally.  No carotid bruit.  Normal pedal pulses.  Abdomen: Soft, nontender, no hepatosplenomegaly, no distention.  Skin: Intact without lesions or rashes.  Neurologic: Alert and oriented x 3.  Psych: Normal affect. Extremities: No clubbing or cyanosis.  HEENT: Normal.   ASSESSMENT & PLAN:  1. Chronic systolic CHF:  Primarily nonischemic cardiomyopathy. Echo in 2017 with EF 15-20%. Medtronic ICD. Echo 10/2017 EF 15-20%, severely dilated RV with severely decreased systolic function. LHC/RHC 11/06/17 showed volume overload with 80% RCA stenosis.  Cardiac index low at 1.91. The degree of coronary disease does not explain his cardiomyopathy.  NYHA class II symptoms but he is volume overloaded on exam.  - Increase torsemide to 40 mg bid x 4 days then 40 qam/20 qpm. BMET today and in 10 days.   - Continue entresto 49/51 mg BID.  - Increase eplerenone to 25 mg daily.   - Increase hydralazine to 25 mg tid and isordil to 20 tid.   -  Continue digoxin 0.125 mg daily, check level today.  - Start beta blocker when volume is better-controlled. Will use bisoprolol (more beta-1 selective in setting of COPD.     2. COPD: Never smoked, but apparently had significant occupational exposure. It appears that he won a lawsuit dealing with the occupational exposure-related COPD.  - Continue 2 liters oxygen.  3. CAD:  LHC in 5/19 with 80-90% proximal RCA stenosis treated with DES to RCA.  This did not cause his cardiomyopathy but is a large RCA.  No chest pain.  - Continue ASA 81 daily.  - Continue atorvastatin  - Continue Plavix x 1 year post-PCI.  4. Polycythemia: Likely related to chronic hypoxia.  - Refuses sleep study.  5. OSA: Refuses sleep study 6. DM: He is on empagliflozin per his report though not on med list.    Followup in 6 wks with echo with me but will return in 2 wks for volume check with NP/PA.  Continue paramedicine. I am no sure is taking all his medicines as ordered.   Brandie Lopes 06/09/2018    

## 2018-06-13 ENCOUNTER — Other Ambulatory Visit (HOSPITAL_COMMUNITY): Payer: Self-pay

## 2018-06-13 ENCOUNTER — Other Ambulatory Visit (HOSPITAL_COMMUNITY): Payer: Self-pay | Admitting: Internal Medicine

## 2018-06-13 ENCOUNTER — Telehealth (HOSPITAL_COMMUNITY): Payer: Self-pay

## 2018-06-13 NOTE — Telephone Encounter (Signed)
I called Richard [name] to schedule an appointment. He stated he would be home all day so we agreed to meet this afternoon.

## 2018-06-13 NOTE — Progress Notes (Signed)
Paramedicine Encounter    Patient ID: Richard [name], male    DOB: 1960-11-04, 57 y.o.   MRN: [id number]   Patient Care Team: Ananias Pilgrim, MD as PCP - General (Family Medicine) Burna Sis, LCSW as Social Worker (Licensed Clinical Social Worker)  Patient Active Problem List   Diagnosis Date Noted  . Acute on chronic combined systolic and diastolic CHF (congestive heart failure) (HCC) 03/15/2018  . S/P right and left heart catheterization 11/02/2017  . Panic attacks 11/02/2017  . Non-STEMI (non-ST elevated myocardial infarction) (HCC)   . COPD (chronic obstructive pulmonary disease) (HCC)   . Hypertension   . CKD (chronic kidney disease)   . 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HCC)   . ICD (implantable cardioverter-defibrillator) in place   . GERD (gastroesophageal reflux disease)   . Diabetes mellitus with diabetic neuropathy, with long-term current use of insulin (HCC)     Current Outpatient Medications:  .  allopurinol (ZYLOPRIM) 100 MG tablet, Take 1 tablet (100 mg total) by mouth daily., Disp: 30 tablet, Rfl: 3 .  aspirin EC 81 MG EC tablet, Take 1 tablet (81 mg total) by mouth daily., Disp: 90 tablet, Rfl: 0 .  atorvastatin (LIPITOR) 40 MG tablet, Take 1 tablet (40 mg total) by mouth daily at 6 PM., Disp: 30 tablet, Rfl: 5 .  BREO ELLIPTA 100-25 MCG/INH AEPB, Inhale 1 puff into the lungs daily., Disp: , Rfl: 5 .  clopidogrel (PLAVIX) 75 MG tablet, Take 1 tablet (75 mg total) by mouth daily with breakfast., Disp: 11 tablet, Rfl: 0 .  digoxin (LANOXIN) 0.125 MG tablet, Take 1 tablet (0.125 mg total) by mouth daily., Disp: 30 tablet, Rfl: 3 .  diphenhydrAMINE (BENADRYL) 25 MG tablet, Take 25 mg by mouth at bedtime as needed., Disp: , Rfl:  .  DULoxetine (CYMBALTA) 60 MG capsule, Take 60 mg by mouth daily., Disp: , Rfl: 2 .  eplerenone (INSPRA) 25 MG tablet, Take 1 tablet (25 mg total) by mouth daily., Disp: 30 tablet, Rfl: 6 .  hydrALAZINE (APRESOLINE) 25 MG tablet, Take 1 tablet (25 mg  total) by mouth 3 (three) times daily., Disp: 270 tablet, Rfl: 3 .  insulin regular human CONCENTRATED (HUMULIN R U-500 KWIKPEN) 500 UNIT/ML kwikpen, USE AS DIRECTED PER SLIDING SCALE. MAX DAILY DOSE IS 400 UNITS., Disp: , Rfl:  .  isosorbide dinitrate (ISORDIL) 20 MG tablet, Take 1 tablet (20 mg total) by mouth 3 (three) times daily., Disp: 30 tablet, Rfl: 6 .  MAGNESIUM-OXIDE 400 (241.3 Mg) MG tablet, Take 400 mg by mouth daily., Disp: , Rfl: 11 .  omeprazole (PRILOSEC) 20 MG capsule, Take 20 mg by mouth daily., Disp: , Rfl: 1 .  potassium chloride SA (K-DUR,KLOR-CON) 20 MEQ tablet, Take 1 tablet (20 mEq total) by mouth daily., Disp: 90 tablet, Rfl: 3 .  pregabalin (LYRICA) 100 MG capsule, Take 1 capsule (100 mg total) by mouth 2 (two) times daily., Disp: 60 capsule, Rfl: 3 .  sacubitril-valsartan (ENTRESTO) 49-51 MG, Take 1 tablet by mouth 2 (two) times daily., Disp: 60 tablet, Rfl: 11 .  torsemide (DEMADEX) 20 MG tablet, Take 2 tablets (40 mg total) by mouth every morning AND 1 tablet (20 mg total) every evening., Disp: 90 tablet, Rfl: 6 .  Vitamin D, Cholecalciferol, 1000 units CAPS, Take 1,000 Units by mouth daily., Disp: , Rfl: 11 .  empagliflozin (JARDIANCE) 10 MG TABS tablet, Take 10 mg by mouth daily., Disp: , Rfl:  .  Insulin Pen Needle (NOVOFINE) 30G  X 8 MM MISC, Inject 10 each into the skin as needed. (Patient not taking: Reported on 06/13/2018), Disp: 100 each, Rfl: 0 .  ranitidine (ZANTAC) 150 MG tablet, Take 150 mg by mouth 2 (two) times daily., Disp: , Rfl:  Allergies  Allergen Reactions  . Codeine   . Coconut Oil Itching      Social History   Socioeconomic History  . Marital status: Divorced    Spouse name: Not on file  . Number of children: Not on file  . Years of education: Not on file  . Highest education level: Not on file  Occupational History  . Occupation: disability    Comment: stopped working in 2000 d/t occupational exposures  Social Needs  . Financial  resource strain: Not on file  . Food insecurity:    Worry: Not on file    Inability: Not on file  . Transportation needs:    Medical: Not on file    Non-medical: Not on file  Tobacco Use  . Smoking status: Former Smoker    Types: Cigarettes  . Smokeless tobacco: Never Used  . Tobacco comment: "smoked when I drank"  Substance and Sexual Activity  . 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 Drug use: Never  . Sexual activity: Not Currently  Lifestyle  . Physical activity:    Days per week: Not on file    Minutes per session: Not on file  . Stress: Not on file  Relationships  . Social connections:    Talks on phone: Not on file    Gets together: Not on file    Attends religious service: Not on file    Active member of club or organization: Not on file    Attends meetings of clubs or organizations: Not on file    Relationship status: Not on file  . Intimate partner violence:    Fear of current or ex partner: Not on file    Emotionally abused: Not on file    Physically abused: Not on file    Forced sexual activity: Not on file  Other Topics Concern  . Not on file  Social History Narrative   Patient lives at home with wife and some of his 10 children. He self-administers his own medications.    Physical Exam Cardiovascular:     Rate and Rhythm: Normal rate and regular rhythm.  Pulmonary:     Effort: Pulmonary effort is normal.     Breath sounds: Normal breath sounds.  Musculoskeletal:     Right lower leg: Edema present.     Left lower leg: Edema present.  Skin:    General: Skin is warm and dry.     Capillary Refill: Capillary refill takes less than 2 seconds.  Neurological:     Mental Status: He is alert.  Psychiatric:        Mood and Affect: Mood normal.         Future Appointments  Date Time Provider Department Center  07/05/2018 12:00 PM [facility] PA/NP [facility] None  08/02/2018  2:00 PM [facility]  08/02/2018  3:00 PM Laurey MoraleMcLean, Dalton S, MD [facility] None  09/02/2018  8:05 AM [facility] DEVICE REMOTES [facility]    BP 128/86 (BP Location: Left Arm, Patient Position: Sitting, Cuff Size: Normal)   Pulse 96   Resp 16   Wt 259 lb (117.5 kg)   SpO2 91%  BMI 29.93 kg/m   Weight yesterday- 260 lb Last visit weight- 260 lb  Mr Oldman was seen at home today and reported feeling well. He denied chest pain, SOB, headache, dizziness or orthopnea. He stated he has been having GI issues but has not seen a specialist. He reported that using Cymbalta helps with his abdominal discomfort and has been taking two per day at times which is in conflict with how the prescription is written. I pointed this out and advised against taking it any other way than as directed and he was understanding. His medications were verified and his pillbox was refilled. The only missing medication was Jardiance which was called into the pharmacy and he stated he would be able to go pick it up.   [name], EMT 06/13/18  ACTION: Home visit completed Next visit planned for 1 week

## 2018-06-17 ENCOUNTER — Telehealth (HOSPITAL_COMMUNITY): Payer: Self-pay

## 2018-06-17 NOTE — Telephone Encounter (Signed)
I called Richard [name] to schedule an appointment. He did not answer so I left a voicemail requesting he call me back.  

## 2018-06-18 ENCOUNTER — Telehealth (HOSPITAL_COMMUNITY): Payer: Self-pay

## 2018-06-18 NOTE — Telephone Encounter (Signed)
I called Mr Cashwell to schedule an appointment. He did not answer so I left a voicemail requesting he call me back.  

## 2018-07-03 ENCOUNTER — Other Ambulatory Visit (HOSPITAL_COMMUNITY): Payer: Self-pay

## 2018-07-03 NOTE — Progress Notes (Signed)
Paramedicine Encounter    Patient ID: Richard [name], male    DOB: Mar 12, 1961, 58 y.o.   MRN: [id number]   Patient Care Team: Ananias Pilgrim, MD as PCP - General (Family Medicine) Burna Sis, LCSW as Social Worker (Licensed Clinical Social Worker)  Patient Active Problem List   Diagnosis Date Noted  . Acute on chronic combined systolic and diastolic CHF (congestive heart failure) (HCC) 03/15/2018  . S/P right and left heart catheterization 11/02/2017  . Panic attacks 11/02/2017  . Non-STEMI (non-ST elevated myocardial infarction) (HCC)   . COPD (chronic obstructive pulmonary disease) (HCC)   . Hypertension   . CKD (chronic kidney disease)   . 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HCC)   . ICD (implantable cardioverter-defibrillator) in place   . GERD (gastroesophageal reflux disease)   . Diabetes mellitus with diabetic neuropathy, with long-term current use of insulin (HCC)     Current Outpatient Medications:  .  allopurinol (ZYLOPRIM) 100 MG tablet, Take 1 tablet (100 mg total) by mouth daily., Disp: 30 tablet, Rfl: 3 .  aspirin EC 81 MG EC tablet, Take 1 tablet (81 mg total) by mouth daily., Disp: 90 tablet, Rfl: 0 .  atorvastatin (LIPITOR) 40 MG tablet, Take 1 tablet (40 mg total) by mouth daily at 6 PM., Disp: 30 tablet, Rfl: 5 .  BREO ELLIPTA 100-25 MCG/INH AEPB, Inhale 1 puff into the lungs daily., Disp: , Rfl: 5 .  clopidogrel (PLAVIX) 75 MG tablet, Take 1 tablet (75 mg total) by mouth daily with breakfast., Disp: 11 tablet, Rfl: 0 .  digoxin (LANOXIN) 0.125 MG tablet, Take 1 tablet (0.125 mg total) by mouth daily., Disp: 30 tablet, Rfl: 3 .  DULoxetine (CYMBALTA) 60 MG capsule, Take 60 mg by mouth daily., Disp: , Rfl: 2 .  eplerenone (INSPRA) 25 MG tablet, Take 1 tablet (25 mg total) by mouth daily., Disp: 30 tablet, Rfl: 6 .  hydrALAZINE (APRESOLINE) 25 MG tablet, Take 1 tablet (25 mg total) by mouth 3 (three) times daily., Disp: 270 tablet, Rfl: 3 .  insulin regular human  CONCENTRATED (HUMULIN R U-500 KWIKPEN) 500 UNIT/ML kwikpen, USE AS DIRECTED PER SLIDING SCALE. MAX DAILY DOSE IS 400 UNITS., Disp: , Rfl:  .  isosorbide dinitrate (ISORDIL) 20 MG tablet, Take 1 tablet (20 mg total) by mouth 3 (three) times daily., Disp: 30 tablet, Rfl: 6 .  MAGNESIUM-OXIDE 400 (241.3 Mg) MG tablet, Take 400 mg by mouth daily., Disp: , Rfl: 11 .  omeprazole (PRILOSEC) 20 MG capsule, Take 20 mg by mouth daily., Disp: , Rfl: 1 .  potassium chloride SA (K-DUR,KLOR-CON) 20 MEQ tablet, Take 1 tablet (20 mEq total) by mouth daily., Disp: 90 tablet, Rfl: 3 .  pregabalin (LYRICA) 100 MG capsule, Take 1 capsule (100 mg total) by mouth 2 (two) times daily., Disp: 60 capsule, Rfl: 3 .  sacubitril-valsartan (ENTRESTO) 49-51 MG, Take 1 tablet by mouth 2 (two) times daily., Disp: 60 tablet, Rfl: 11 .  torsemide (DEMADEX) 20 MG tablet, Take 2 tablets (40 mg total) by mouth every morning AND 1 tablet (20 mg total) every evening., Disp: 90 tablet, Rfl: 6 .  Vitamin D, Cholecalciferol, 1000 units CAPS, Take 1,000 Units by mouth daily., Disp: , Rfl: 11 .  diphenhydrAMINE (BENADRYL) 25 MG tablet, Take 25 mg by mouth at bedtime as needed., Disp: , Rfl:  .  empagliflozin (JARDIANCE) 10 MG TABS tablet, Take 10 mg by mouth daily., Disp: , Rfl:  .  Insulin Pen Needle (NOVOFINE) 30G  X 8 MM MISC, Inject 10 each into the skin as needed. (Patient not taking: Reported on 06/13/2018), Disp: 100 each, Rfl: 0 .  ranitidine (ZANTAC) 150 MG tablet, Take 150 mg by mouth 2 (two) times daily., Disp: , Rfl:  Allergies  Allergen Reactions  . Codeine   . Coconut Oil Itching      Social History   Socioeconomic History  . Marital status: Divorced    Spouse name: Not on file  . Number of children: Not on file  . Years of education: Not on file  . Highest education level: Not on file  Occupational History  . Occupation: disability    Comment: stopped working in 2000 d/t occupational exposures  Social Needs  .  Financial resource strain: Not on file  . Food insecurity:    Worry: Not on file    Inability: Not on file  . Transportation needs:    Medical: Not on file    Non-medical: Not on file  Tobacco Use  . Smoking status: Former Smoker    Types: Cigarettes  . Smokeless tobacco: Never Used  . Tobacco comment: "smoked when I drank"  Substance and Sexual Activity  . 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 Drug use: Never  . Sexual activity: Not Currently  Lifestyle  . Physical activity:    Days per week: Not on file    Minutes per session: Not on file  . Stress: Not on file  Relationships  . Social connections:    Talks on phone: Not on file    Gets together: Not on file    Attends religious service: Not on file    Active member of club or organization: Not on file    Attends meetings of clubs or organizations: Not on file    Relationship status: Not on file  . Intimate partner violence:    Fear of current or ex partner: Not on file    Emotionally abused: Not on file    Physically abused: Not on file    Forced sexual activity: Not on file  Other Topics Concern  . Not on file  Social History Narrative   Patient lives at home with wife and some of his 10 children. He self-administers his own medications.    Physical Exam Cardiovascular:     Rate and Rhythm: Normal rate and regular rhythm.  Pulmonary:     Effort: Pulmonary effort is normal.     Breath sounds: Normal breath sounds.  Abdominal:     General: There is distension.  Musculoskeletal:     Right lower leg: Edema present.     Left lower leg: Edema present.  Skin:    General: Skin is warm and dry.     Capillary Refill: Capillary refill takes less than 2 seconds.  Neurological:     Mental Status: He is alert.  Psychiatric:        Mood and Affect: Mood normal.         Future Appointments  Date Time Provider Department Center  07/05/2018 12:00 PM [facility]  PA/NP [facility] None  08/02/2018  2:00 PM MC ECHO 1-BU[facility]  08/02/2018  3:00 PM Laurey Morale, MD [facility] None  09/02/2018  8:05 AM [facility] DEVICE REMOTES [facility]    BP 120/90 (BP Location: Left Arm, Patient Position: Sitting, Cuff Size: Large)   Pulse 92   Resp 16  Wt 260 lb (117.9 kg)   SpO2 90%   BMI 30.05 kg/m   Weight yesterday- 260 lb Last visit weight- 259 lb  Richard [name] was seen at home today and reported feeling mostly well. He did complain of increasing anxiety attacks in the evenings and he has a wound on his left lower leg. The wound is being treated by a specialist however he missed his appointment today because his car would not start. He exhibits some abdominal distension and BLEE as well. He stated he does not always take his medications as prescribed because it makes him "feel sick on the stomach." I advised that he should be eating when he takes his medications to prevent this from happening. I asked that he take the medications exactly as I put them in his pillbox for the next two weeks so we can see if his fluid decreases. He was agreeable at the time of our conversation. He has an appointment on Friday which I reminded him of today.   [name], EMT 07/03/18  ACTION: Home visit completed Next visit planned for 2 weeks

## 2018-07-04 NOTE — Progress Notes (Addendum)
Advanced Heart Failure Clinic Note   PCP: Richard Pilgrim, MD PCP-Cardiologist: Richard [name]  HPI: Richard [name] is a 58 y.o. male with a history of chronic systolic HF s/p Medtronic ICD (12/2016), HTN, DM, and HLD.   Admitted 5/8-5/14/19 with A/C systolic HF. Echo showed EF 15-20%. Advanced HF team was consulted. He diuresed 20 lbs with IV lasix, then transitioned to torsemide 40 mg daily. Underwent R/LHC showing a focal severe RCA stenosis treated with DES.  He did not, however, appear to have severe enough coronary disease to explain his cardiomyopathy. Cardiac index was low at 1.9.  HF meds were optimized. Beta blocker was not started due to low CI and soft BPs. He was discharged on home oxygen. Referred to cardiac rehab. DC weight: 260 lbs.   He returns today for HF follow up. Last visit torsemide was increased and eplerenone and hydralazine were increased. SBP on paramedicine checks ~120/90. Not taking all medications per Richard [name] note. Overall he feels better with the medication changes. He is less SOB, but still gets SOB if he tries to move quickly. Peripheral edema has improved. He still has abdominal bloating, but is less "hard". No CP or dizziness. Denies orthopnea. Wears his O2 PRN. Has occasional anxiety attacks at night. He ate smoked sausage before coming into clinic. Limits fluid intake. Says he does not miss any medication doses. Followed by HF paramedicine.   Optivol: Thoracic impedence below threshold, but trending up. Fluid index mildly elevated. Active 1-2 hours/day. No VT/VF.   Labs (10/19): K 3.9, creatinine 1.18  ECG (personally reviewed): NSR, IVCD 126 msec, anterior Qs  PMH: 1. Chronic systolic CHF: Primarily nonischemic cardiomyopathy.  Has Medtronic ICD.   - Echo 5/19: EF 15-20%, grade 2 DD, mild MR, RV severely reduced, LA moderately dilated, RA mildly dilated, mild TR.  - RHC/LHC (5/19): 80% distal LAD stenosis, 80% proximal RCA stenosis treated with DES to pRCA. Mean RA  13, PA 69/31 mean 47, mean 24, CI 1.91, PVR 4.8 WU.  - Gynecomastia with spironolactone. 2. COPD: Uses oxygen at night. Never smoked, but apparently had significant occupational exposure. It appears that he won a lawsuit dealing with the occupational exposure-related COPD.  3. CAD: Cath in 5/17 with 60% RCA stenosis.  - LHC (5/19): 80% distal LAD, 80% proximal RCA.  DES to proximal RCA.  4. Polycythemia: Probably due to chronic hypoxemia.  5. Type II diabetes.  6. HTN 7. GERD  Review of systems complete and found to be negative unless listed in HPI.   Current Outpatient Medications  Medication Sig Dispense Refill  . allopurinol (ZYLOPRIM) 100 MG tablet Take 1 tablet (100 mg total) by mouth daily. 30 tablet 3  . aspirin EC 81 MG EC tablet Take 1 tablet (81 mg total) by mouth daily. 90 tablet 0  . atorvastatin (LIPITOR) 40 MG tablet Take 1 tablet (40 mg total) by mouth daily at 6 PM. 30 tablet 5  . BREO ELLIPTA 100-25 MCG/INH AEPB Inhale 1 puff into the lungs daily.  5  . clopidogrel (PLAVIX) 75 MG tablet Take 1 tablet (75 mg total) by mouth daily with breakfast. 11 tablet 0  . digoxin (LANOXIN) 0.125 MG tablet Take 1 tablet (0.125 mg total) by mouth daily. 30 tablet 3  . diphenhydrAMINE (BENADRYL) 25 MG tablet Take 25 mg by mouth at bedtime as needed.    . DULoxetine (CYMBALTA) 60 MG capsule Take 60 mg by mouth daily.  2  . empagliflozin (JARDIANCE) 10 MG TABS  tablet Take 10 mg by mouth daily.    Marland Kitchen eplerenone (INSPRA) 25 MG tablet Take 1 tablet (25 mg total) by mouth daily. 30 tablet 6  . hydrALAZINE (APRESOLINE) 25 MG tablet Take 1 tablet (25 mg total) by mouth 3 (three) times daily. 270 tablet 3  . Insulin Pen Needle (NOVOFINE) 30G X 8 MM MISC Inject 10 each into the skin as needed. 100 each 0  . insulin regular human CONCENTRATED (HUMULIN R U-500 KWIKPEN) 500 UNIT/ML kwikpen USE AS DIRECTED PER SLIDING SCALE. MAX DAILY DOSE IS 400 UNITS.    Marland Kitchen isosorbide dinitrate (ISORDIL) 20 MG tablet  Take 1 tablet (20 mg total) by mouth 3 (three) times daily. 30 tablet 6  . MAGNESIUM-OXIDE 400 (241.3 Mg) MG tablet Take 400 mg by mouth daily.  11  . omeprazole (PRILOSEC) 20 MG capsule Take 20 mg by mouth daily.  1  . potassium chloride SA (K-DUR,KLOR-CON) 20 MEQ tablet Take 1 tablet (20 mEq total) by mouth daily. 90 tablet 3  . pregabalin (LYRICA) 100 MG capsule Take 1 capsule (100 mg total) by mouth 2 (two) times daily. 60 capsule 3  . ranitidine (ZANTAC) 150 MG tablet Take 150 mg by mouth 2 (two) times daily.    . sacubitril-valsartan (ENTRESTO) 49-51 MG Take 1 tablet by mouth 2 (two) times daily. 60 tablet 11  . torsemide (DEMADEX) 20 MG tablet Take 2 tablets (40 mg total) by mouth every morning AND 1 tablet (20 mg total) every evening. 90 tablet 6  . Vitamin D, Cholecalciferol, 1000 units CAPS Take 1,000 Units by mouth daily.  11   No current facility-administered medications for this encounter.    Allergies  Allergen Reactions  . Codeine   . Coconut Oil Itching    Social History   Socioeconomic History  . Marital status: Divorced    Spouse name: Not on file  . Number of children: Not on file  . Years of education: Not on file  . Highest education level: Not on file  Occupational History  . Occupation: disability    Comment: stopped working in 2000 d/t occupational exposures  Social Needs  . Financial resource strain: Not on file  . Food insecurity:    Worry: Not on file    Inability: Not on file  . Transportation needs:    Medical: Not on file    Non-medical: Not on file  Tobacco Use  . Smoking status: Former Smoker    Types: Cigarettes  . Smokeless tobacco: Never Used  . Tobacco comment: "smoked when I drank"  Substance and Sexual Activity  . 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 Drug use: Never  . Sexual activity: Not Currently  Lifestyle  . Physical activity:    Days per week: Not on file    Minutes  per session: Not on file  . Stress: Not on file  Relationships  . Social connections:    Talks on phone: Not on file    Gets together: Not on file    Attends religious service: Not on file    Active member of club or organization: Not on file    Attends meetings of clubs or organizations: Not on file    Relationship status: Not on file  . Intimate partner violence:    Fear of current or ex partner: Not on file    Emotionally abused: Not on file  Physically abused: Not on file    Forced sexual activity: Not on file  Other Topics Concern  . Not on file  Social History Narrative   Patient lives at home with wife and some of his 10 children. He self-administers his own medications.   Family History  Problem Relation Age of Onset  . Hypertension Mother   . Diabetes Mother   . Hypertension Father   . Diabetes Father   . Diabetes Sister   . Diabetes Brother    Vitals:   07/05/18 1218  BP: (!) 144/92  Pulse: 90  SpO2: 96%  Weight: 120.1 kg (264 lb 12.8 oz)     Wt Readings from Last 3 Encounters:  07/05/18 120.1 kg (264 lb 12.8 oz)  07/03/18 117.9 kg (260 lb)  06/13/18 117.5 kg (259 lb)   PHYSICAL EXAM: General: No resp difficulty. HEENT: Normal Neck: Supple. JVP ~10. Carotids 2+ bilat; no bruits. No thyromegaly or nodule noted. Cor: PMI nondisplaced. RRR, No M/G/R noted Lungs: Decreased in lower lobes.  Abdomen: Soft, non-tender, +distension, no HSM. No bruits or masses. +BS  Extremities: No cyanosis, clubbing, or rash. R and LLE 1+ ankle edema.  Neuro: Alert & orientedx3, cranial nerves grossly intact. moves all 4 extremities w/o difficulty. Affect pleasant   ASSESSMENT & PLAN:  1. Chronic systolic CHF:  Primarily nonischemic cardiomyopathy. Echo in 2017 with EF 15-20%. Medtronic ICD. Echo 10/2017 EF 15-20%, severely dilated RV with severely decreased systolic function. LHC/RHC 11/06/17 showed volume overload with 80% RCA stenosis.  Cardiac index low at 1.91. The  degree of coronary disease does not explain his cardiomyopathy.  NYHA class II symptoms. Volume still elevated, but weight down 6 lbs.  - Increase torsemide to 40 mg BID x 2 days, then back to 40 mg am/20 mg pm - Increase entresto to 97/103 mg BID. BMET in 2 weeks.   - Continue eplerenone 25 mg daily.   - Continue hydralazine 25 mg tid and isordil 20 tid.   - Continue digoxin 0.125 mg daily. Dig level <0.2 05/2018 - Start beta blocker when volume is better controlled. Will use bisoprolol (more beta-1 selective in setting of COPD).  2. COPD: Never smoked, but apparently had significant occupational exposure. It appears that he won a lawsuit dealing with the occupational exposure-related COPD.  - Continue 2 liters oxygen PRN. No change.  3. CAD:  LHC in 5/19 with 80-90% proximal RCA stenosis treated with DES to RCA.  This did not cause his cardiomyopathy but is a large RCA.  No s/s ischemia.  - Continue ASA 81 daily.  - Continue atorvastatin  - Continue Plavix x 1 year post-PCI.  4. Polycythemia: Likely related to chronic hypoxia.  - Refuses sleep study. No change.  5. OSA: Refuses sleep study. Wears O2 PRN.  6. DM: He is on empagliflozin per his report though not on med list.    BMET today.  Take extra torsemide 40 mg BID x 2 days, then back to 40 mg am, 20 mg pm. Increase Entresto to 97/103 BID. Hopefully will help fluid continue to come off. BMET in 2 weeks. Will let Richard [name] with paramedicine know of medication changes. Has f/u with Richard [name] in 3-4 weeks.   Alford Highland NP 07/05/2018  Greater than 50% of the 25 minute visit was spent in counseling/coordination of care regarding disease state education, salt/fluid restriction, sliding scale diuretics, and medication compliance.

## 2018-07-05 ENCOUNTER — Encounter (HOSPITAL_COMMUNITY): Payer: Self-pay

## 2018-07-05 ENCOUNTER — Ambulatory Visit (HOSPITAL_COMMUNITY)
Admission: RE | Admit: 2018-07-05 | Discharge: 2018-07-05 | Disposition: A | Discharge status: Home / Self Care | Payer: Medicare Other | Source: Ambulatory Visit | Attending: Cardiology | Admitting: Cardiology

## 2018-07-05 VITALS — BP 144/92 | HR 90 | Wt 264.8 lb

## 2018-07-05 DIAGNOSIS — D751 Secondary polycythemia: Secondary | ICD-10-CM | POA: Insufficient documentation

## 2018-07-05 DIAGNOSIS — I110 Hypertensive heart disease with heart failure: Secondary | ICD-10-CM | POA: Diagnosis present

## 2018-07-05 DIAGNOSIS — Z833 Family history of diabetes mellitus: Secondary | ICD-10-CM | POA: Diagnosis not present

## 2018-07-05 DIAGNOSIS — J449 Chronic obstructive pulmonary disease, unspecified: Secondary | ICD-10-CM | POA: Diagnosis not present

## 2018-07-05 DIAGNOSIS — K219 Gastro-esophageal reflux disease without esophagitis: Secondary | ICD-10-CM | POA: Insufficient documentation

## 2018-07-05 DIAGNOSIS — Z79899 Other long term (current) drug therapy: Secondary | ICD-10-CM | POA: Insufficient documentation

## 2018-07-05 DIAGNOSIS — Z794 Long term (current) use of insulin: Secondary | ICD-10-CM | POA: Insufficient documentation

## 2018-07-05 DIAGNOSIS — Z87891 Personal history of nicotine dependence: Secondary | ICD-10-CM | POA: Diagnosis not present

## 2018-07-05 DIAGNOSIS — I2510 Atherosclerotic heart disease of native coronary artery without angina pectoris: Secondary | ICD-10-CM | POA: Diagnosis not present

## 2018-07-05 DIAGNOSIS — I428 Other cardiomyopathies: Secondary | ICD-10-CM | POA: Diagnosis not present

## 2018-07-05 DIAGNOSIS — Z7901 Long term (current) use of anticoagulants: Secondary | ICD-10-CM | POA: Diagnosis not present

## 2018-07-05 DIAGNOSIS — E119 Type 2 diabetes mellitus without complications: Secondary | ICD-10-CM | POA: Diagnosis not present

## 2018-07-05 DIAGNOSIS — Z885 Allergy status to narcotic agent status: Secondary | ICD-10-CM | POA: Diagnosis not present

## 2018-07-05 DIAGNOSIS — Z8249 Family history of ischemic heart disease and other diseases of the circulatory system: Secondary | ICD-10-CM | POA: Diagnosis not present

## 2018-07-05 DIAGNOSIS — Z7902 Long term (current) use of antithrombotics/antiplatelets: Secondary | ICD-10-CM | POA: Diagnosis not present

## 2018-07-05 DIAGNOSIS — E785 Hyperlipidemia, unspecified: Secondary | ICD-10-CM | POA: Insufficient documentation

## 2018-07-05 DIAGNOSIS — Z9981 Dependence on supplemental oxygen: Secondary | ICD-10-CM | POA: Insufficient documentation

## 2018-07-05 DIAGNOSIS — G4733 Obstructive sleep apnea (adult) (pediatric): Secondary | ICD-10-CM | POA: Insufficient documentation

## 2018-07-05 DIAGNOSIS — Z955 Presence of coronary angioplasty implant and graft: Secondary | ICD-10-CM | POA: Insufficient documentation

## 2018-07-05 DIAGNOSIS — Z91018 Allergy to other foods: Secondary | ICD-10-CM | POA: Insufficient documentation

## 2018-07-05 DIAGNOSIS — I5022 Chronic systolic (congestive) heart failure: Secondary | ICD-10-CM | POA: Diagnosis present

## 2018-07-05 DIAGNOSIS — Z7982 Long term (current) use of aspirin: Secondary | ICD-10-CM | POA: Diagnosis not present

## 2018-07-05 LAB — BASIC METABOLIC PANEL
Anion gap: 8 (ref 5–15)
BUN: 15 mg/dL (ref 6–20)
CO2: 31 mmol/L (ref 22–32)
Calcium: 8.6 mg/dL — ABNORMAL LOW (ref 8.9–10.3)
Chloride: 100 mmol/L (ref 98–111)
Creatinine, Ser: 1.05 mg/dL (ref 0.61–1.24)
GFR calc Af Amer: >60 mL/min (ref >60)
GFR calc non Af Amer: >60 mL/min (ref >60)
Glucose, Bld: 165 mg/dL — ABNORMAL HIGH (ref 70–99)
Potassium: 4.1 mmol/L (ref 3.5–5.1)
Sodium: 139 mmol/L (ref 135–145)

## 2018-07-05 MED ORDER — TORSEMIDE 20 MG PO TABS: 40.0000 mg | ORAL_TABLET | Freq: Two times a day (BID) | ORAL | 6 refills | Status: DC

## 2018-07-05 MED ORDER — TORSEMIDE 20 MG PO TABS: ORAL_TABLET | ORAL | 6 refills | Status: DC

## 2018-07-05 MED ORDER — SACUBITRIL-VALSARTAN 97-103 MG PO TABS: 1.0000 | ORAL_TABLET | Freq: Two times a day (BID) | ORAL | 6 refills | Status: DC

## 2018-07-05 NOTE — Patient Instructions (Addendum)
INCREASE Torsemide to 40mg  (2 tabs) twice a day for 2 days, THEN back down to normal dose of 40mg  in the morning and 20mg  in the evening.   INCREASE Entresto to 97/103mg  (1 tab) twice a day. A new prescription has been sent to your pharmacy   Labs today and repeat in 2 weeks We will only contact you if something comes back abnormal or we need to make some changes. Otherwise no news is good news!   KEEP your appointment with Dr Shirlee Latch in 4 weeks.   Do the following things EVERYDAY: 1) Weigh yourself in the morning before breakfast. Write it down and keep it in a log. 2) Take your medicines as prescribed 3) Eat low salt foods-Limit salt (sodium) to 2000 mg per day.  4) Stay as active as you can everyday 5) Limit all fluids for the day to less than 2 liters

## 2018-07-10 ENCOUNTER — Telehealth (HOSPITAL_COMMUNITY): Payer: Self-pay

## 2018-07-10 NOTE — Telephone Encounter (Signed)
I called Richard [name] to see if he was available for an appointment. He stated he was about to take his daughter to the train station and would prefer if we meet tomorrow. We agreed to meet at 10:00 tomorrow morning.

## 2018-07-11 ENCOUNTER — Telehealth (HOSPITAL_COMMUNITY): Payer: Self-pay

## 2018-07-11 NOTE — Telephone Encounter (Signed)
I called Mr Richard [name] to see if he was available to meet this afternoon. He did not answer so I left a voicemail requesting he call me back.

## 2018-07-17 ENCOUNTER — Other Ambulatory Visit (HOSPITAL_COMMUNITY): Payer: Self-pay

## 2018-07-17 ENCOUNTER — Other Ambulatory Visit (HOSPITAL_COMMUNITY): Payer: Self-pay | Admitting: Internal Medicine

## 2018-07-17 LAB — CUP PACEART REMOTE DEVICE CHECK
Battery Remaining Longevity: 119 mo
Brady Statistic AP VP Percent: 0 %
Brady Statistic AP VS Percent: 0.01 %
Brady Statistic AS VP Percent: 0.04 %
Brady Statistic AS VS Percent: 99.94 %
Brady Statistic RA Percent Paced: 0.01 %
Brady Statistic RV Percent Paced: 0.05 %
Date Time Interrogation Session: 20191209072507
HighPow Impedance: 69 Ohm
Implantable Lead Implant Date: 20180719
Implantable Lead Implant Date: 20180719
Implantable Lead Location: 753859
Implantable Lead Location: 753860
Implantable Lead Model: 5076
Implantable Pulse Generator Implant Date: 20180719
Lead Channel Impedance Value: 304 Ohm
Lead Channel Impedance Value: 399 Ohm
Lead Channel Impedance Value: 456 Ohm
Lead Channel Pacing Threshold Amplitude: 0.75 V
Lead Channel Pacing Threshold Amplitude: 1 V
Lead Channel Pacing Threshold Pulse Width: 0.4 ms
Lead Channel Sensing Intrinsic Amplitude: 4.375 mV
Lead Channel Sensing Intrinsic Amplitude: 9.875 mV
Lead Channel Sensing Intrinsic Amplitude: 9.875 mV
Lead Channel Setting Pacing Amplitude: 2 V
Lead Channel Setting Pacing Amplitude: 2.25 V
Lead Channel Setting Pacing Pulse Width: 0.4 ms
Lead Channel Setting Sensing Sensitivity: 0.3 mV
MDC IDC MSMT BATTERY VOLTAGE: 3.01 V
MDC IDC MSMT LEADCHNL RA SENSING INTR AMPL: 4.375 mV
MDC IDC MSMT LEADCHNL RV PACING THRESHOLD PULSEWIDTH: 0.4 ms

## 2018-07-17 NOTE — Progress Notes (Signed)
Paramedicine Encounter    Patient ID: Richard [name], male    DOB: February 05, 1961, 58 y.o.   MRN: [id number]   Patient Care Team: Ananias Pilgrim, MD as PCP - General (Family Medicine) Burna Sis, LCSW as Social Worker (Licensed Clinical Social Worker)  Patient Active Problem List   Diagnosis Date Noted  . Acute on chronic combined systolic and diastolic CHF (congestive heart failure) (HCC) 03/15/2018  . S/P right and left heart catheterization 11/02/2017  . Panic attacks 11/02/2017  . Non-STEMI (non-ST elevated myocardial infarction) (HCC)   . COPD (chronic obstructive pulmonary disease) (HCC)   . Hypertension   . CKD (chronic kidney disease)   . 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HCC)   . ICD (implantable cardioverter-defibrillator) in place   . GERD (gastroesophageal reflux disease)   . Diabetes mellitus with diabetic neuropathy, with long-term current use of insulin (HCC)     Current Outpatient Medications:  .  allopurinol (ZYLOPRIM) 100 MG tablet, Take 1 tablet (100 mg total) by mouth daily., Disp: 30 tablet, Rfl: 3 .  aspirin EC 81 MG EC tablet, Take 1 tablet (81 mg total) by mouth daily., Disp: 90 tablet, Rfl: 0 .  atorvastatin (LIPITOR) 40 MG tablet, Take 1 tablet (40 mg total) by mouth daily at 6 PM., Disp: 30 tablet, Rfl: 5 .  BREO ELLIPTA 100-25 MCG/INH AEPB, Inhale 1 puff into the lungs daily., Disp: , Rfl: 5 .  clopidogrel (PLAVIX) 75 MG tablet, Take 1 tablet (75 mg total) by mouth daily with breakfast., Disp: 11 tablet, Rfl: 0 .  digoxin (LANOXIN) 0.125 MG tablet, Take 1 tablet (0.125 mg total) by mouth daily., Disp: 30 tablet, Rfl: 3 .  DULoxetine (CYMBALTA) 60 MG capsule, Take 60 mg by mouth daily., Disp: , Rfl: 2 .  eplerenone (INSPRA) 25 MG tablet, Take 1 tablet (25 mg total) by mouth daily., Disp: 30 tablet, Rfl: 6 .  hydrALAZINE (APRESOLINE) 25 MG tablet, Take 1 tablet (25 mg total) by mouth 3 (three) times daily., Disp: 270 tablet, Rfl: 3 .  insulin regular human  CONCENTRATED (HUMULIN R U-500 KWIKPEN) 500 UNIT/ML kwikpen, USE AS DIRECTED PER SLIDING SCALE. MAX DAILY DOSE IS 400 UNITS., Disp: , Rfl:  .  isosorbide dinitrate (ISORDIL) 20 MG tablet, Take 1 tablet (20 mg total) by mouth 3 (three) times daily., Disp: 30 tablet, Rfl: 6 .  MAGNESIUM-OXIDE 400 (241.3 Mg) MG tablet, Take 400 mg by mouth daily., Disp: , Rfl: 11 .  omeprazole (PRILOSEC) 20 MG capsule, Take 20 mg by mouth daily., Disp: , Rfl: 1 .  potassium chloride SA (K-DUR,KLOR-CON) 20 MEQ tablet, Take 1 tablet (20 mEq total) by mouth daily., Disp: 90 tablet, Rfl: 3 .  pregabalin (LYRICA) 100 MG capsule, Take 1 capsule (100 mg total) by mouth 2 (two) times daily., Disp: 60 capsule, Rfl: 3 .  sacubitril-valsartan (ENTRESTO) 97-103 MG, Take 1 tablet by mouth 2 (two) times daily., Disp: 60 tablet, Rfl: 6 .  torsemide (DEMADEX) 20 MG tablet, Take 2 tablets (40 mg total) by mouth every morning AND 1 tablet (20 mg total) every evening., Disp: 90 tablet, Rfl: 6 .  diphenhydrAMINE (BENADRYL) 25 MG tablet, Take 25 mg by mouth at bedtime as needed., Disp: , Rfl:  .  empagliflozin (JARDIANCE) 10 MG TABS tablet, Take 10 mg by mouth daily., Disp: , Rfl:  .  Insulin Pen Needle (NOVOFINE) 30G X 8 MM MISC, Inject 10 each into the skin as needed., Disp: 100 each, Rfl: 0 .  ranitidine (ZANTAC) 150 MG tablet, Take 150 mg by mouth 2 (two) times daily., Disp: , Rfl:  .  Vitamin D, Cholecalciferol, 1000 units CAPS, Take 1,000 Units by mouth daily., Disp: , Rfl: 11 Allergies  Allergen Reactions  . Codeine   . Coconut Oil Itching      Social History   Socioeconomic History  . Marital status: Divorced    Spouse name: Not on file  . Number of children: Not on file  . Years of education: Not on file  . Highest education level: Not on file  Occupational History  . Occupation: disability    Comment: stopped working in 2000 d/t occupational exposures  Social Needs  . Financial resource strain: Not on file  . Food  insecurity:    Worry: Not on file    Inability: Not on file  . Transportation needs:    Medical: Not on file    Non-medical: Not on file  Tobacco Use  . Smoking status: Former Smoker    Types: Cigarettes  . Smokeless tobacco: Never Used  . Tobacco comment: "smoked when I drank"  Substance and Sexual Activity  . 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 Drug use: Never  . Sexual activity: Not Currently  Lifestyle  . Physical activity:    Days per week: Not on file    Minutes per session: Not on file  . Stress: Not on file  Relationships  . Social connections:    Talks on phone: Not on file    Gets together: Not on file    Attends religious service: Not on file    Active member of club or organization: Not on file    Attends meetings of clubs or organizations: Not on file    Relationship status: Not on file  . Intimate partner violence:    Fear of current or ex partner: Not on file    Emotionally abused: Not on file    Physically abused: Not on file    Forced sexual activity: Not on file  Other Topics Concern  . Not on file  Social History Narrative   Patient lives at home with wife and some of his 10 children. He self-administers his own medications.    Physical Exam Cardiovascular:     Rate and Rhythm: Normal rate and regular rhythm.     Pulses: Normal pulses.  Pulmonary:     Effort: Pulmonary effort is normal.  Musculoskeletal: Normal range of motion.     Right lower leg: Edema present.  Skin:    General: Skin is warm and dry.     Capillary Refill: Capillary refill takes less than 2 seconds.  Neurological:     Mental Status: He is alert and oriented to person, place, and time.  Psychiatric:        Mood and Affect: Mood normal.         Future Appointments  Date Time Provider Department Center  07/19/2018 12:00 PM [facility] None  07/30/2018  1:00 PM [facility]  09/02/2018   8:05 AM [facility] DEVICE REMOTES [facility]    BP 140/90 (BP Location: Left Arm, Patient Position: Sitting, Cuff Size: Normal)   Pulse 80   Resp 16   Wt 260 lb (117.9 kg)   SpO2 92%   BMI 30.05 kg/m   Weight yesterday- 259 lb Last visit weight- 260 lb  Mr Kopas  was seen at home today and reported feeling "better since that fluid came off." Upon opening his pillbox, I noted that he had not taken a significant amount of medications since our last visit. In addition to not taking medications, He did not double his dose of entresto in accordance with the orders from the clinic. I inquired why he is not taking his medications as prescribed and he said it is because they make him sick. I reiterated the importance of taking these medications as prescribed and when I attempted to go through each medications, as we have previously done, he stated he already knows what they are for. I verified his medications and refilled his pillbox. I asked again that he take his medications as they are in his box over the next week. He was without Jardiance and ran out of eplerenone so his pillbox was marked with where they are to be put when he picks them up from the pharmacy. I offered to come back and do this for him but he insisted that he could do it. Given his continued noncompliance, I will advise the clinic that he is approaching discharge unless things turn around. Below is a detailed list of which medications and how many days of each were sill in his pillbox.   Did not increase entresto. Still had 3.5 days in pillbox 4 days allopurinol  8 days of aspirin 3 days of plavix 4 days of dig 1 day of cymbalta 2 days of eplerenone 5.5 days of hydralazine 5.5 days of isosorbide 7 days of mag oxide 2 days of omeprazole 9 days of potassium 1 day lyrica 5.5 days of torsemide *Did not have any JardianceJacqualine Code, EMT 07/17/18  ACTION: Home visit completed Next visit planned  for 1 week

## 2018-07-19 ENCOUNTER — Ambulatory Visit (HOSPITAL_COMMUNITY)
Admission: RE | Admit: 2018-07-19 | Discharge: 2018-07-19 | Disposition: A | Discharge status: Home / Self Care | Payer: Medicare Other | Source: Ambulatory Visit | Attending: Internal Medicine | Admitting: Internal Medicine

## 2018-07-19 DIAGNOSIS — I5022 Chronic systolic (congestive) heart failure: Secondary | ICD-10-CM | POA: Diagnosis not present

## 2018-07-19 LAB — BASIC METABOLIC PANEL
Anion gap: 9 (ref 5–15)
BUN: 16 mg/dL (ref 6–20)
CO2: 36 mmol/L — AB (ref 22–32)
Calcium: 8 mg/dL — ABNORMAL LOW (ref 8.9–10.3)
Chloride: 97 mmol/L — ABNORMAL LOW (ref 98–111)
Creatinine, Ser: 1.14 mg/dL (ref 0.61–1.24)
GFR calc Af Amer: >60 mL/min (ref >60)
GFR calc non Af Amer: >60 mL/min (ref >60)
Glucose, Bld: 243 mg/dL — ABNORMAL HIGH (ref 70–99)
Potassium: 3.8 mmol/L (ref 3.5–5.1)
Sodium: 142 mmol/L (ref 135–145)

## 2018-07-24 ENCOUNTER — Other Ambulatory Visit (HOSPITAL_COMMUNITY): Payer: Self-pay

## 2018-07-24 NOTE — Progress Notes (Signed)
Paramedicine Encounter    Patient ID: Richard [name], male    DOB: 1961-01-03, 58 y.o.   MRN: [id number]   Patient Care Team: Richard [name], Alehegn, MD as PCP - General (Family Medicine)  Patient Active Problem List   Diagnosis Date Noted  . Acute on chronic combined systolic and diastolic CHF (congestive heart failure) (HCC) 03/15/2018  . S/P right and left heart catheterization 11/02/2017  . Panic attacks 11/02/2017  . Non-STEMI (non-ST elevated myocardial infarction) (HCC)   . COPD (chronic obstructive pulmonary disease) (HCC)   . Hypertension   . CKD (chronic kidney disease)   . 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HCC)   . ICD (implantable cardioverter-defibrillator) in place   . GERD (gastroesophageal reflux disease)   . Diabetes mellitus with diabetic neuropathy, with long-term current use of insulin (HCC)     Current Outpatient Medications:  .  allopurinol (ZYLOPRIM) 100 MG tablet, Take 1 tablet (100 mg total) by mouth daily., Disp: 30 tablet, Rfl: 3 .  aspirin EC 81 MG EC tablet, Take 1 tablet (81 mg total) by mouth daily., Disp: 90 tablet, Rfl: 0 .  atorvastatin (LIPITOR) 40 MG tablet, Take 1 tablet (40 mg total) by mouth daily at 6 PM., Disp: 30 tablet, Rfl: 5 .  BREO ELLIPTA 100-25 MCG/INH AEPB, Inhale 1 puff into the lungs daily., Disp: , Rfl: 5 .  clopidogrel (PLAVIX) 75 MG tablet, Take 1 tablet (75 mg total) by mouth daily with breakfast., Disp: 11 tablet, Rfl: 0 .  digoxin (LANOXIN) 0.125 MG tablet, Take 1 tablet (0.125 mg total) by mouth daily., Disp: 30 tablet, Rfl: 3 .  DULoxetine (CYMBALTA) 60 MG capsule, Take 60 mg by mouth daily., Disp: , Rfl: 2 .  hydrALAZINE (APRESOLINE) 25 MG tablet, Take 1 tablet (25 mg total) by mouth 3 (three) times daily., Disp: 270 tablet, Rfl: 3 .  insulin regular human CONCENTRATED (HUMULIN R U-500 KWIKPEN) 500 UNIT/ML kwikpen, USE AS DIRECTED PER SLIDING SCALE. MAX DAILY DOSE IS 400 UNITS., Disp: , Rfl:  .  isosorbide dinitrate (ISORDIL) 20 MG tablet,  Take 1 tablet (20 mg total) by mouth 3 (three) times daily., Disp: 30 tablet, Rfl: 6 .  MAGNESIUM-OXIDE 400 (241.3 Mg) MG tablet, Take 400 mg by mouth daily., Disp: , Rfl: 11 .  omeprazole (PRILOSEC) 20 MG capsule, Take 20 mg by mouth daily., Disp: , Rfl: 1 .  potassium chloride SA (K-DUR,KLOR-CON) 20 MEQ tablet, Take 1 tablet (20 mEq total) by mouth daily., Disp: 90 tablet, Rfl: 3 .  pregabalin (LYRICA) 100 MG capsule, Take 1 capsule (100 mg total) by mouth 2 (two) times daily., Disp: 60 capsule, Rfl: 3 .  sacubitril-valsartan (ENTRESTO) 97-103 MG, Take 1 tablet by mouth 2 (two) times daily., Disp: 60 tablet, Rfl: 6 .  torsemide (DEMADEX) 20 MG tablet, Take 2 tablets (40 mg total) by mouth every morning AND 1 tablet (20 mg total) every evening., Disp: 90 tablet, Rfl: 6 .  diphenhydrAMINE (BENADRYL) 25 MG tablet, Take 25 mg by mouth at bedtime as needed., Disp: , Rfl:  .  empagliflozin (JARDIANCE) 10 MG TABS tablet, Take 10 mg by mouth daily., Disp: , Rfl:  .  eplerenone (INSPRA) 25 MG tablet, Take 1 tablet (25 mg total) by mouth daily., Disp: 30 tablet, Rfl: 6 .  Insulin Pen Needle (NOVOFINE) 30G X 8 MM MISC, Inject 10 each into the skin as needed., Disp: 100 each, Rfl: 0 .  ranitidine (ZANTAC) 150 MG tablet, Take 150 mg by mouth  2 (two) times daily., Disp: , Rfl:  .  Vitamin D, Cholecalciferol, 1000 units CAPS, Take 1,000 Units by mouth daily., Disp: , Rfl: 11 Allergies  Allergen Reactions  . Codeine   . Coconut Oil Itching      Social History   Socioeconomic History  . Marital status: Divorced    Spouse name: Not on file  . Number of children: Not on file  . Years of education: Not on file  . Highest education level: Not on file  Occupational History  . Occupation: disability    Comment: stopped working in 2000 d/t occupational exposures  Social Needs  . Financial resource strain: Not on file  . Food insecurity:    Worry: Not on file    Inability: Not on file  . Transportation  needs:    Medical: Not on file    Non-medical: Not on file  Tobacco Use  . Smoking status: Former Smoker    Types: Cigarettes  . Smokeless tobacco: Never Used  . Tobacco comment: "smoked when I drank"  Substance and Sexual Activity  . 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 Drug use: Never  . Sexual activity: Not Currently  Lifestyle  . Physical activity:    Days per week: Not on file    Minutes per session: Not on file  . Stress: Not on file  Relationships  . Social connections:    Talks on phone: Not on file    Gets together: Not on file    Attends religious service: Not on file    Active member of club or organization: Not on file    Attends meetings of clubs or organizations: Not on file    Relationship status: Not on file  . Intimate partner violence:    Fear of current or ex partner: Not on file    Emotionally abused: Not on file    Physically abused: Not on file    Forced sexual activity: Not on file  Other Topics Concern  . Not on file  Social History Narrative   Patient lives at home with wife and some of his 10 children. He self-administers his own medications.    Physical Exam      Future Appointments  Date Time Provider Department Center  07/30/2018  1:00 PM [facility]  09/02/2018  8:05 AM [facility] DEVICE REMOTES [facility]    BP 118/88 (BP Location: Left Arm, Patient Position: Sitting, Cuff Size: Large)   Pulse 96   Resp 16   Wt 260 lb (117.9 kg)   SpO2 92%   BMI 30.05 kg/m   Weight yesterday- 260 lb Last visit weight- 260 lb  Richard [name] was seen at home today and reported feeling well. He stated he has been dealing with a productive cough from a cold over the past week. He denied chest pain, SOB, headache, or orthopnea. He stated he has been compliant with his medications over the past week however he had not taken >3 days worth of medications from his  pillbox and had not picked up the prescriptions which I ordered for him last week. I asked if he know what each of his medications were fore and he advised that he does. I helped him fill the empty bins out of his pillbox and noticed that he was only able to tell the medications apart by the look of the pill but he  was generally correct about which medications was which and where to put it in his pillbox. Due to his continued non-compliance with his medications, despite being told of their importance repeatedly, I have decided to discharge him from the paramedicine program. At this point I believe he has been educated to the fullest of my ability and does not have financial barriers to obtaining his medications. I advised him of this and explained that this would be our last visit. He expressed understanding and was agreeable.   [name], EMT 07/24/18  ACTION: Home visit completed

## 2018-07-30 ENCOUNTER — Other Ambulatory Visit: Payer: Self-pay

## 2018-07-30 ENCOUNTER — Emergency Department (HOSPITAL_COMMUNITY): Payer: Medicare Other

## 2018-07-30 ENCOUNTER — Encounter (HOSPITAL_COMMUNITY): Payer: Self-pay

## 2018-07-30 ENCOUNTER — Ambulatory Visit (HOSPITAL_BASED_OUTPATIENT_CLINIC_OR_DEPARTMENT_OTHER)
Admission: RE | Admit: 2018-07-30 | Discharge: 2018-07-30 | Disposition: A | Discharge status: Home / Self Care | Payer: Medicare Other | Source: Ambulatory Visit | Attending: Cardiology | Admitting: Cardiology

## 2018-07-30 ENCOUNTER — Emergency Department (HOSPITAL_COMMUNITY)
Admission: EM | Admit: 2018-07-30 | Discharge: 2018-07-30 | Disposition: A | Discharge status: Home / Self Care | Payer: Medicare Other | Attending: Emergency Medicine | Admitting: Emergency Medicine

## 2018-07-30 DIAGNOSIS — I5043 Acute on chronic combined systolic (congestive) and diastolic (congestive) heart failure: Secondary | ICD-10-CM | POA: Diagnosis not present

## 2018-07-30 DIAGNOSIS — I340 Nonrheumatic mitral (valve) insufficiency: Secondary | ICD-10-CM | POA: Insufficient documentation

## 2018-07-30 DIAGNOSIS — J441 Chronic obstructive pulmonary disease with (acute) exacerbation: Secondary | ICD-10-CM | POA: Insufficient documentation

## 2018-07-30 DIAGNOSIS — Z79899 Other long term (current) drug therapy: Secondary | ICD-10-CM | POA: Diagnosis not present

## 2018-07-30 DIAGNOSIS — I130 Hypertensive heart and chronic kidney disease with heart failure and stage 1 through stage 4 chronic kidney disease, or unspecified chronic kidney disease: Secondary | ICD-10-CM | POA: Diagnosis not present

## 2018-07-30 DIAGNOSIS — Z87891 Personal history of nicotine dependence: Secondary | ICD-10-CM | POA: Diagnosis not present

## 2018-07-30 DIAGNOSIS — E785 Hyperlipidemia, unspecified: Secondary | ICD-10-CM

## 2018-07-30 DIAGNOSIS — I5022 Chronic systolic (congestive) heart failure: Secondary | ICD-10-CM | POA: Insufficient documentation

## 2018-07-30 DIAGNOSIS — I110 Hypertensive heart disease with heart failure: Secondary | ICD-10-CM | POA: Insufficient documentation

## 2018-07-30 DIAGNOSIS — N189 Chronic kidney disease, unspecified: Secondary | ICD-10-CM | POA: Diagnosis not present

## 2018-07-30 DIAGNOSIS — J449 Chronic obstructive pulmonary disease, unspecified: Secondary | ICD-10-CM

## 2018-07-30 DIAGNOSIS — Z95810 Presence of automatic (implantable) cardiac defibrillator: Secondary | ICD-10-CM | POA: Insufficient documentation

## 2018-07-30 DIAGNOSIS — Z7982 Long term (current) use of aspirin: Secondary | ICD-10-CM | POA: Insufficient documentation

## 2018-07-30 DIAGNOSIS — R05 Cough: Secondary | ICD-10-CM | POA: Diagnosis present

## 2018-07-30 DIAGNOSIS — E119 Type 2 diabetes mellitus without complications: Secondary | ICD-10-CM

## 2018-07-30 LAB — COMPREHENSIVE METABOLIC PANEL
ALT: 33 U/L (ref 0–44)
AST: 32 U/L (ref 15–41)
Albumin: 3.6 g/dL (ref 3.5–5.0)
Alkaline Phosphatase: 65 U/L (ref 38–126)
Anion gap: 17 — ABNORMAL HIGH (ref 5–15)
BUN: 13 mg/dL (ref 6–20)
CHLORIDE: 87 mmol/L — AB (ref 98–111)
CO2: 38 mmol/L — ABNORMAL HIGH (ref 22–32)
Calcium: 8 mg/dL — ABNORMAL LOW (ref 8.9–10.3)
Creatinine, Ser: 1.25 mg/dL — ABNORMAL HIGH (ref 0.61–1.24)
GFR calc Af Amer: >60 mL/min (ref >60)
GFR calc non Af Amer: >60 mL/min (ref >60)
Glucose, Bld: 146 mg/dL — ABNORMAL HIGH (ref 70–99)
Potassium: 3.8 mmol/L (ref 3.5–5.1)
Sodium: 142 mmol/L (ref 135–145)
Total Bilirubin: 1.3 mg/dL — ABNORMAL HIGH (ref 0.3–1.2)
Total Protein: 7.7 g/dL (ref 6.5–8.1)

## 2018-07-30 LAB — CBC WITH DIFFERENTIAL/PLATELET
Abs Immature Granulocytes: 0.01 10*3/uL (ref 0.00–0.07)
BASOS ABS: 0 10*3/uL (ref 0.0–0.1)
Basophils Relative: 1 %
Eosinophils Absolute: 0 10*3/uL (ref 0.0–0.5)
Eosinophils Relative: 1 %
HCT: 55.5 % — ABNORMAL HIGH (ref 39.0–52.0)
HEMOGLOBIN: 17.7 g/dL — AB (ref 13.0–17.0)
Immature Granulocytes: 0 %
Lymphocytes Relative: 34 %
Lymphs Abs: 1.9 10*3/uL (ref 0.7–4.0)
MCH: 30.8 pg (ref 26.0–34.0)
MCHC: 31.9 g/dL (ref 30.0–36.0)
MCV: 96.7 fL (ref 80.0–100.0)
Monocytes Absolute: 0.7 10*3/uL (ref 0.1–1.0)
Monocytes Relative: 13 %
Neutro Abs: 2.9 10*3/uL (ref 1.7–7.7)
Neutrophils Relative %: 51 %
Platelets: 280 10*3/uL (ref 150–400)
RBC: 5.74 MIL/uL (ref 4.22–5.81)
RDW: 14.6 % (ref 11.5–15.5)
WBC: 5.6 10*3/uL (ref 4.0–10.5)
nRBC: 0 % (ref 0.0–0.2)

## 2018-07-30 LAB — TROPONIN I: Troponin I: 0.06 ng/mL (ref <0.03)

## 2018-07-30 LAB — BRAIN NATRIURETIC PEPTIDE: B NATRIURETIC PEPTIDE 5: 294.1 pg/mL — AB (ref 0.0–100.0)

## 2018-07-30 MED ORDER — ALBUTEROL SULFATE (2.5 MG/3ML) 0.083% IN NEBU
5.0000 mg | INHALATION_SOLUTION | Freq: Once | RESPIRATORY_TRACT | Status: AC
  Administered 2018-07-30: 5 mg via RESPIRATORY_TRACT
  Filled 2018-07-30: qty 6

## 2018-07-30 MED ORDER — METHYLPREDNISOLONE SODIUM SUCC 125 MG IJ SOLR
125.0000 mg | Freq: Once | INTRAMUSCULAR | Status: AC
  Administered 2018-07-30: 125 mg via INTRAVENOUS
  Filled 2018-07-30: qty 2

## 2018-07-30 MED ORDER — PREDNISONE 20 MG PO TABS: ORAL_TABLET | ORAL | 0 refills | Status: DC

## 2018-07-30 MED ORDER — DOXYCYCLINE HYCLATE 100 MG PO CAPS: 100.0000 mg | ORAL_CAPSULE | Freq: Two times a day (BID) | ORAL | 0 refills | Status: DC

## 2018-07-30 NOTE — ED Triage Notes (Addendum)
Pt reports productive cough, nasal congestion for 1 week, no relief from OTC medications. Denies any pain. Pt states he uses 2L oxygen as needed and has had to use some this week, also using breathing treatments with some relief.

## 2018-07-30 NOTE — Progress Notes (Signed)
  Echocardiogram 2D Echocardiogram has been performed.  Richard [name] 07/30/2018, 2:10 PM

## 2018-07-30 NOTE — ED Provider Notes (Signed)
[facility] Provider Note   CSN: [id number] Arrival date & time: 07/30/18  1357     History   Chief Complaint Chief Complaint  Patient presents with  . Cough  . Nasal Congestion    HPI Richard [name] is a 58 y.o. male.  HPI Patient presents with 1 week of cough productive of yellow sputum, nasal congestion and shortness of breath.  States he is had some lower extremity swelling but this is improved.  He was evaluated his cardiologist today and advised to come to the emergency department for further work-up for possible pneumonia.  States his shortness of breath is improved after receiving albuterol nebulized treatment.  Denies any fever or chills. Past Medical History:  Diagnosis Date  . AICD (automatic cardioverter/defibrillator) present 01/11/2017  . Anxiety   . CKD (chronic kidney disease)   . COPD (chronic obstructive pulmonary disease) (HCC)    never smoker, industrial exposure  . Diabetes mellitus with diabetic neuropathy, with long-term current use of insulin (HCC)   . GERD (gastroesophageal reflux disease)   . High cholesterol   . History of gout    "take RX qd" (11/01/2017)  . Hypertension   . Nonobstructive atherosclerosis of coronary artery   . On home oxygen therapy    "2L prn" (11/01/2017)  . 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HCC)    LLL, present for years  . Systolic HF (heart failure) [facility](HCC)     Patient Active Problem List   Diagnosis Date Noted  . Acute on chronic combined systolic and diastolic CHF (congestive heart failure) (HCC) 03/15/2018  . S/P right and left heart catheterization 11/02/2017  . Panic attacks 11/02/2017  . Non-STEMI (non-ST elevated myocardial infarction) (HCC)   . COPD (chronic obstructive pulmonary disease) (HCC)   . Hypertension   . CKD (chronic kidney disease)   . 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HCC)   . ICD (implantable cardioverter-defibrillator) in place   . GERD (gastroesophageal reflux disease)   .  Diabetes mellitus with diabetic neuropathy, with long-term current use of insulin [facility](HCC)     Past Surgical History:  Procedure Laterality Date  . CATARACT EXTRACTION W/ INTRAOCULAR LENS IMPLANTW/ TRABECULECTOMY Left ~ 2013   "may have done this when I had my other eye OR"  . CIRCUMCISION    . CORONARY STENT INTERVENTION N/A 11/02/2017   Procedure: CORONARY STENT INTERVENTION;  Surgeon: Laurey MoraleMcLean, Dalton S, MD;  [facility];  Service: Cardiovascular;  Laterality: N/A;  . CORONARY STENT INTERVENTION N/A 11/02/2017   Procedure: CORONARY STENT INTERVENTION;  Surgeon: Lyn RecordsSmith, Henry W, MD;  Location: [facility] INVASIVE CV LAB;  Service: Cardiovascular;  Laterality: N/A;  . EYE SURGERY Left ~ 2013   "fell on something in basement; stuck in my eye"  . ICD IMPLANT     Medtronic Dual Chamber 01/11/17  . MULTIPLE TOOTH EXTRACTIONS     "pulled all my top teeth"  . RIGHT/LEFT HEART CATH AND CORONARY ANGIOGRAPHY N/A 11/02/2017   Procedure: RIGHT/LEFT HEART CATH AND CORONARY ANGIOGRAPHY;  Surgeon: Laurey MoraleMcLean, Dalton S, MD;  [facility];  Service: Cardiovascular;  Laterality: N/A;        Home Medications    Prior to Admission medications   Medication Sig Start Date End Date Taking? Authorizing Provider  allopurinol (ZYLOPRIM) 100 MG tablet Take 1 tablet (100 mg total) by mouth daily. 12/26/17   Clegg, Amy D, NP  aspirin EC 81 MG EC tablet Take 1 tablet (81  mg total) by mouth daily. 11/07/17   Arnetha Courser, MD  atorvastatin (LIPITOR) 40 MG tablet Take 1 tablet (40 mg total) by mouth daily at 6 PM. 04/03/18   Laurey Morale, MD  BREO ELLIPTA 100-25 MCG/INH AEPB Inhale 1 puff into the lungs daily. 10/28/17   [provider]  clopidogrel (PLAVIX) 75 MG tablet Take 1 tablet (75 mg total) by mouth daily with breakfast. 04/24/18   Laurey Morale, MD  digoxin (LANOXIN) 0.125 MG tablet Take 1 tablet (0.125 mg total) by mouth daily. 04/30/18   Laurey Morale, MD  diphenhydrAMINE  (BENADRYL) 25 MG tablet Take 25 mg by mouth at bedtime as needed.    [provider]  doxycycline (VIBRAMYCIN) 100 MG capsule Take 1 capsule (100 mg total) by mouth 2 (two) times daily. One po bid x 7 days 07/30/18   Loren Racer, MD  DULoxetine (CYMBALTA) 60 MG capsule Take 60 mg by mouth daily. 09/28/17   [provider]  empagliflozin (JARDIANCE) 10 MG TABS tablet Take 10 mg by mouth daily.    [provider]  eplerenone (INSPRA) 25 MG tablet Take 1 tablet (25 mg total) by mouth daily. 06/07/18   Laurey Morale, MD  hydrALAZINE (APRESOLINE) 25 MG tablet Take 1 tablet (25 mg total) by mouth 3 (three) times daily. 06/07/18   Laurey Morale, MD  Insulin Pen Needle (NOVOFINE) 30G X 8 MM MISC Inject 10 each into the skin as needed. 11/06/17   Arnetha Courser, MD  insulin regular human CONCENTRATED (HUMULIN R U-500 KWIKPEN) 500 UNIT/ML kwikpen USE AS DIRECTED PER SLIDING SCALE. MAX DAILY DOSE IS 400 UNITS. 07/18/17   [provider]  isosorbide dinitrate (ISORDIL) 20 MG tablet Take 1 tablet (20 mg total) by mouth 3 (three) times daily. 06/07/18   Laurey Morale, MD  MAGNESIUM-OXIDE 400 (241.3 Mg) MG tablet Take 400 mg by mouth daily. 10/28/17   [provider]  omeprazole (PRILOSEC) 20 MG capsule Take 20 mg by mouth daily. 10/04/17   [provider]  potassium chloride SA (K-DUR,KLOR-CON) 20 MEQ tablet Take 1 tablet (20 mEq total) by mouth daily. 01/18/18   Graciella Freer, PA-C  predniSONE (DELTASONE) 20 MG tablet 3 tabs po day one, then 2 po daily x 4 days 07/31/18   Loren Racer, MD  pregabalin (LYRICA) 100 MG capsule Take 1 capsule (100 mg total) by mouth 2 (two) times daily. 12/26/17   Clegg, Amy D, NP  ranitidine (ZANTAC) 150 MG tablet Take 150 mg by mouth 2 (two) times daily.    [provider]  sacubitril-valsartan (ENTRESTO) 97-103 MG Take 1 tablet by mouth 2 (two) times daily. 07/05/18   Alford Highland, NP  torsemide  (DEMADEX) 20 MG tablet Take 2 tablets (40 mg total) by mouth every morning AND 1 tablet (20 mg total) every evening. 07/05/18   Alford Highland, NP  Vitamin D, Cholecalciferol, 1000 units CAPS Take 1,000 Units by mouth daily. 10/17/17   [provider]    Family History Family History  Problem Relation Age of Onset  . Hypertension Mother   . Diabetes Mother   . Hypertension Father   . Diabetes Father   . Diabetes Sister   . Diabetes Brother     Social History Social History   Tobacco Use  . Smoking status: Former Smoker    Types: Cigarettes  . Smokeless tobacco: Never Used  . Tobacco comment: "smoked when I drank"  Substance Use Topics  . 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 Drug use: Never     Allergies   Codeine and Coconut oil   Review of Systems Review of Systems  Constitutional: Negative for chills and fever.  HENT: Positive for congestion.   Respiratory: Positive for cough and shortness of breath.   Cardiovascular: Positive for leg swelling. Negative for chest pain and palpitations.  Gastrointestinal: Negative for abdominal pain, constipation, diarrhea, nausea and vomiting.  Genitourinary: Negative for dysuria, flank pain and frequency.  Musculoskeletal: Negative for back pain, myalgias and neck pain.  Skin: Negative for rash and wound.  Neurological: Negative for dizziness, weakness, light-headedness and numbness.  All other systems reviewed and are negative.    Physical Exam Updated Vital Signs BP (!) 121/93   Pulse 91   Temp 98.4 F (36.9 C) (Oral)   Resp 18   SpO2 94%   Physical Exam Vitals signs and nursing note reviewed.  Constitutional:      Appearance: Normal appearance. He is well-developed.  HENT:     Head: Normocephalic and atraumatic.     Nose: Congestion present.     Mouth/Throat:     Mouth: Mucous membranes are moist.     Pharynx: No oropharyngeal exudate or  posterior oropharyngeal erythema.  Eyes:     Extraocular Movements: Extraocular movements intact.     Pupils: Pupils are equal, round, and reactive to light.  Neck:     Musculoskeletal: Normal range of motion and neck supple. No neck rigidity or muscular tenderness.     Vascular: No carotid bruit.  Cardiovascular:     Rate and Rhythm: Normal rate and regular rhythm.  Pulmonary:     Effort: Pulmonary effort is normal.     Breath sounds: Wheezing present.     Comments: Expiratory wheezing throughout Abdominal:     General: Bowel sounds are normal.     Palpations: Abdomen is soft.     Tenderness: There is no abdominal tenderness. There is no guarding or rebound.  Musculoskeletal: Normal range of motion.        General: No swelling, tenderness, deformity or signs of injury.     Right lower leg: Edema present.     Left lower leg: Edema present.     Comments: 1+ pitting edema bilateral lower extremities.  Lymphadenopathy:     Cervical: No cervical adenopathy.  Skin:    General: Skin is warm and dry.     Findings: No erythema or rash.  Neurological:     General: No focal deficit present.     Mental Status: He is alert and oriented to person, place, and time.  Psychiatric:        Behavior: Behavior normal.      ED Treatments / Results  Labs (all labs ordered are listed, but only abnormal results are displayed) Labs Reviewed  CBC WITH DIFFERENTIAL/PLATELET - Abnormal; Notable for the following components:      Result Value   Hemoglobin 17.7 (*)    HCT 55.5 (*)    All other components within normal limits  BRAIN NATRIURETIC PEPTIDE - Abnormal; Notable for the following components:   B Natriuretic Peptide 294.1 (*)    All other components within normal limits  COMPREHENSIVE METABOLIC PANEL - Abnormal; Notable for the following components:   Chloride 87 (*)    CO2 38 (*)    Glucose, Bld 146 (*)  Creatinine, Ser 1.25 (*)    Calcium 8.0 (*)    Total Bilirubin 1.3 (*)     Anion gap 17 (*)    All other components within normal limits  TROPONIN I - Abnormal; Notable for the following components:   Troponin I 0.06 (*)    All other components within normal limits    EKG EKG Interpretation  Date/Time:  Tuesday July 30 2018 19:02:10 EST Ventricular Rate:  94 PR Interval:    QRS Duration: 123 QT Interval:  406 QTC Calculation: 508 R Axis:   -123 Text Interpretation:  Sinus rhythm Multiform ventricular premature complexes LAE, consider biatrial enlargement Nonspecific intraventricular conduction delay Probable lateral infarct, age indeterminate Anterior infarct, old Confirmed by Loren RacerYelverton, Oluwadarasimi Redmon ([id number]) on 07/30/2018 9:21:53 PM   Radiology Dg Chest 2 View  Result Date: 07/30/2018 CLINICAL DATA:  Cough and congestion EXAM: CHEST - 2 VIEW COMPARISON:  Oct 31, 2017 FINDINGS: There is a nodular opacity, felt to represent a mass in left lower lobe measuring 3.5 x 3.3 x 3.0 cm. The lungs elsewhere are clear. The heart is mildly enlarged with pulmonary venous hypertension. Pacemaker leads are attached to the right atrium and right ventricle. No adenopathy. No bone lesions. IMPRESSION: 1. Apparent mass left lower lobe measuring 3.5 x 3.3 x 3.0 cm. Advise correlation with chest CT, ideally with intravenous contrast to further assess. 2. Pulmonary vascular congestion without edema or consolidation. Pacemaker leads attached to right atrium and right ventricle. 3.  No evident adenopathy by radiography. Electronically Signed   By: Bretta BangWilliam  Woodruff III M.D.   On: 07/30/2018 14:55    Procedures Procedures (including critical care time)  Medications Ordered in ED Medications  albuterol (PROVENTIL) (2.5 MG/3ML) 0.083% nebulizer solution 5 mg (5 mg Nebulization Given 07/30/18 1415)  albuterol (PROVENTIL) (2.5 MG/3ML) 0.083% nebulizer solution 5 mg (5 mg Nebulization Given 07/30/18 2007)  methylPREDNISolone sodium succinate (SOLU-MEDROL) 125 mg/2 mL injection 125 mg (125 mg  Intravenous Given 07/30/18 2007)     Initial Impression / Assessment and Plan / ED Course  I have reviewed the triage vital signs and the nursing notes.  Pertinent labs & imaging results that were available during my care of the patient were reviewed by me and considered in my medical decision making (see chart for details).    Wheezing on exam.  Suspect infectious exacerbation of COPD.  Patient also has underlying CHF which is likely complicating clinical picture.  Patient continues to deny any chest pain.  He has mild elevation in his troponin to 0.06.  He states he is feeling much better after breathing treatments and steroids.  He is refusing to wait for delta troponin or possible admission for trending of troponin.  I have low suspicion patient is having an acute MI.  However, cannot completely rule this out.  Patient understands this and still wants to leave.  He understands he needs to follow-up very closely with his cardiologist as well as his primary physician.    Final Clinical Impressions(s) / ED Diagnoses   Final diagnoses:  COPD exacerbation [facility](HCC)    ED Discharge Orders         Ordered    predniSONE (DELTASONE) 20 MG tablet     07/30/18 2123    doxycycline (VIBRAMYCIN) 100 MG capsule  2 times daily     07/30/18 2123           Loren RacerYelverton, Johnathan Heskett, MD 07/30/18 2124

## 2018-07-30 NOTE — Discharge Instructions (Addendum)
Make an appointment to follow-up with your cardiologist.  Return immediately for any chest pain, worsening shortness of breath or concerns.

## 2018-07-30 NOTE — ED Notes (Signed)
Pt stating to this RN that he does not understand why this RN needs to perform any more vital signs, pt refusing to keep blood pressure cuff on.

## 2018-08-02 ENCOUNTER — Encounter (HOSPITAL_COMMUNITY): Payer: Medicare Other | Admitting: Cardiology

## 2018-08-02 ENCOUNTER — Other Ambulatory Visit (HOSPITAL_COMMUNITY): Payer: Medicare Other

## 2018-08-06 ENCOUNTER — Telehealth (HOSPITAL_COMMUNITY): Payer: Self-pay

## 2018-08-06 NOTE — Telephone Encounter (Signed)
Spoke with patient and wife this morning, pt feeling improved after being seen in ED. Appointment made for Tuesday 2/25 to see NP/PA.

## 2018-08-06 NOTE — Telephone Encounter (Signed)
-----   Message from Laurey Morale, MD sent at 07/30/2018 10:27 PM EST ----- EF 20-25%, remains low. I had wanted him admitted from ER but he insisted on going home.  He is very tenuous, needs appt with me asap.

## 2018-08-20 ENCOUNTER — Encounter (HOSPITAL_COMMUNITY): Payer: Self-pay

## 2018-08-20 ENCOUNTER — Ambulatory Visit (HOSPITAL_COMMUNITY)
Admission: RE | Admit: 2018-08-20 | Discharge: 2018-08-20 | Disposition: A | Discharge status: Home / Self Care | Payer: Medicare Other | Source: Ambulatory Visit | Attending: Internal Medicine | Admitting: Internal Medicine

## 2018-08-20 VITALS — BP 120/84 | HR 103 | Wt 266.4 lb

## 2018-08-20 DIAGNOSIS — K219 Gastro-esophageal reflux disease without esophagitis: Secondary | ICD-10-CM | POA: Insufficient documentation

## 2018-08-20 DIAGNOSIS — G4733 Obstructive sleep apnea (adult) (pediatric): Secondary | ICD-10-CM | POA: Diagnosis not present

## 2018-08-20 DIAGNOSIS — Z7902 Long term (current) use of antithrombotics/antiplatelets: Secondary | ICD-10-CM | POA: Insufficient documentation

## 2018-08-20 DIAGNOSIS — J449 Chronic obstructive pulmonary disease, unspecified: Secondary | ICD-10-CM | POA: Diagnosis not present

## 2018-08-20 DIAGNOSIS — Z8249 Family history of ischemic heart disease and other diseases of the circulatory system: Secondary | ICD-10-CM | POA: Diagnosis not present

## 2018-08-20 DIAGNOSIS — I5022 Chronic systolic (congestive) heart failure: Secondary | ICD-10-CM | POA: Diagnosis not present

## 2018-08-20 DIAGNOSIS — Z5731 Occupational exposure to environmental tobacco smoke: Secondary | ICD-10-CM | POA: Insufficient documentation

## 2018-08-20 DIAGNOSIS — Z79899 Other long term (current) drug therapy: Secondary | ICD-10-CM | POA: Diagnosis not present

## 2018-08-20 DIAGNOSIS — I5042 Chronic combined systolic (congestive) and diastolic (congestive) heart failure: Secondary | ICD-10-CM | POA: Diagnosis not present

## 2018-08-20 DIAGNOSIS — I2510 Atherosclerotic heart disease of native coronary artery without angina pectoris: Secondary | ICD-10-CM

## 2018-08-20 DIAGNOSIS — I110 Hypertensive heart disease with heart failure: Secondary | ICD-10-CM | POA: Diagnosis not present

## 2018-08-20 DIAGNOSIS — D751 Secondary polycythemia: Secondary | ICD-10-CM | POA: Insufficient documentation

## 2018-08-20 DIAGNOSIS — Z955 Presence of coronary angioplasty implant and graft: Secondary | ICD-10-CM | POA: Insufficient documentation

## 2018-08-20 DIAGNOSIS — I428 Other cardiomyopathies: Secondary | ICD-10-CM

## 2018-08-20 DIAGNOSIS — Z794 Long term (current) use of insulin: Secondary | ICD-10-CM | POA: Insufficient documentation

## 2018-08-20 DIAGNOSIS — E785 Hyperlipidemia, unspecified: Secondary | ICD-10-CM | POA: Diagnosis not present

## 2018-08-20 DIAGNOSIS — Z9981 Dependence on supplemental oxygen: Secondary | ICD-10-CM | POA: Diagnosis not present

## 2018-08-20 DIAGNOSIS — Z7982 Long term (current) use of aspirin: Secondary | ICD-10-CM | POA: Insufficient documentation

## 2018-08-20 DIAGNOSIS — Q859 Phakomatosis, unspecified: Secondary | ICD-10-CM | POA: Insufficient documentation

## 2018-08-20 DIAGNOSIS — Z9114 Patient's other noncompliance with medication regimen: Secondary | ICD-10-CM | POA: Diagnosis not present

## 2018-08-20 DIAGNOSIS — R0602 Shortness of breath: Secondary | ICD-10-CM | POA: Diagnosis present

## 2018-08-20 DIAGNOSIS — E119 Type 2 diabetes mellitus without complications: Secondary | ICD-10-CM | POA: Diagnosis not present

## 2018-08-20 DIAGNOSIS — Z95810 Presence of automatic (implantable) cardiac defibrillator: Secondary | ICD-10-CM

## 2018-08-20 LAB — BRAIN NATRIURETIC PEPTIDE: B Natriuretic Peptide: 128.4 pg/mL — ABNORMAL HIGH (ref 0.0–100.0)

## 2018-08-20 LAB — BASIC METABOLIC PANEL
Anion gap: 11 (ref 5–15)
BUN: 18 mg/dL (ref 6–20)
CO2: 31 mmol/L (ref 22–32)
Calcium: 8.5 mg/dL — ABNORMAL LOW (ref 8.9–10.3)
Chloride: 98 mmol/L (ref 98–111)
Creatinine, Ser: 1.03 mg/dL (ref 0.61–1.24)
GFR calc Af Amer: >60 mL/min (ref >60)
GFR calc non Af Amer: >60 mL/min (ref >60)
Glucose, Bld: 203 mg/dL — ABNORMAL HIGH (ref 70–99)
Potassium: 4.1 mmol/L (ref 3.5–5.1)
Sodium: 140 mmol/L (ref 135–145)

## 2018-08-20 MED ORDER — TORSEMIDE 20 MG PO TABS: 40.0000 mg | ORAL_TABLET | Freq: Two times a day (BID) | ORAL | 6 refills | Status: DC

## 2018-08-20 MED ORDER — TORSEMIDE 20 MG PO TABS: 40.0000 mg | ORAL_TABLET | Freq: Two times a day (BID) | ORAL | 3 refills | Status: DC

## 2018-08-20 NOTE — Progress Notes (Signed)
Advanced Heart Failure Clinic Note   PCP: Ananias Pilgrim, MD PCP-Cardiologist: Dr Shirlee Latch Pulmonary : Dr Lottie Rater [facility] Endocrinology: Dr [name]  HPI: Richard [name] is a 58 y.o. male with a history of chronic systolic HF s/p Medtronic ICD (12/2016), HTN, DM, left hamartoma,and HLD.   Admitted 5/8-5/14/19 with A/C systolic HF. Echo showed EF 15-20%. Advanced HF team was consulted. He diuresed 20 lbs with IV lasix, then transitioned to torsemide 40 mg daily. Underwent R/LHC showing a focal severe RCA stenosis treated with DES.  He did not, however, appear to have severe enough coronary disease to explain his cardiomyopathy. Cardiac index was low at 1.9.  HF meds were optimized. Beta blocker was not started due to low CI and soft BPs. He was discharged on home oxygen. Referred to cardiac rehab. DC weight: 260 lbs.   On 07/30/18 he was evaluated in the ED for cough. CXR showed LLL mass but has know hamartoma. Discharged with prednisone and doxycycline. He was offered hospital admit but he refused.   Today he returns for HF follow up . Overall feeling fair. SOB with exertion. Denies PND/orthopnea. Uses oxygen as needed. No chest pain.  Appetite ok. Drinking lots of fluids. Eating 2-4 oranges per day.  No fever or chills. Weight at home has been up and down. Taking all medications. No longer followed by HF Paramedicine due to ongoing medication noncompliance.   Labs (10/19): K 3.9, creatinine 1.18 Labs (07/30/18): K 3.8 Creatinine 1.25   PMH: 1. Chronic systolic CHF: Primarily nonischemic cardiomyopathy.  Has Medtronic ICD.   - Echo 5/19: EF 15-20%, grade 2 DD, mild MR, RV severely reduced, LA moderately dilated, RA mildly dilated, mild TR. -Echo 07/30/18 EF 20-25%.  RV moderately reduced.  - RHC/LHC (5/19): 80% distal Richard stenosis, 80% proximal RCA stenosis treated with DES to pRCA. Mean RA 13, PA 69/31 mean 47, mean 24, CI 1.91, PVR 4.8 WU.  - Gynecomastia with spironolactone. 2. COPD: Uses oxygen at  night. Never smoked, but apparently had significant occupational exposure. It appears that he won a lawsuit dealing with the occupational exposure-related COPD.  3. CAD: Cath in 5/17 with 60% RCA stenosis.  - LHC (5/19): 80% distal Richard, 80% proximal RCA.  DES to proximal RCA.  4. Polycythemia: Probably due to chronic hypoxemia.  5. Type II diabetes.  6. HTN 7. GERD  Review of systems complete and found to be negative unless listed in HPI.   Current Outpatient Medications  Medication Sig Dispense Refill  . allopurinol (ZYLOPRIM) 100 MG tablet Take 1 tablet (100 mg total) by mouth daily. 30 tablet 3  . aspirin EC 81 MG EC tablet Take 1 tablet (81 mg total) by mouth daily. 90 tablet 0  . atorvastatin (LIPITOR) 40 MG tablet Take 1 tablet (40 mg total) by mouth daily at 6 PM. 30 tablet 5  . BREO ELLIPTA 100-25 MCG/INH AEPB Inhale 1 puff into the lungs daily.  5  . clopidogrel (PLAVIX) 75 MG tablet Take 1 tablet (75 mg total) by mouth daily with breakfast. 11 tablet 0  . digoxin (LANOXIN) 0.125 MG tablet Take 1 tablet (0.125 mg total) by mouth daily. 30 tablet 3  . diphenhydrAMINE (BENADRYL) 25 MG tablet Take 25 mg by mouth at bedtime as needed.    . DULoxetine (CYMBALTA) 60 MG capsule Take 60 mg by mouth daily.  2  . empagliflozin (JARDIANCE) 10 MG TABS tablet Take 10 mg by mouth daily.    Marland Kitchen eplerenone (INSPRA) 25  MG tablet Take 1 tablet (25 mg total) by mouth daily. 30 tablet 6  . hydrALAZINE (APRESOLINE) 25 MG tablet Take 1 tablet (25 mg total) by mouth 3 (three) times daily. 270 tablet 3  . Insulin Pen Needle (NOVOFINE) 30G X 8 MM MISC Inject 10 each into the skin as needed. 100 each 0  . insulin regular human CONCENTRATED (HUMULIN R U-500 KWIKPEN) 500 UNIT/ML kwikpen USE AS DIRECTED PER SLIDING SCALE. MAX DAILY DOSE IS 400 UNITS.    Marland Kitchen isosorbide dinitrate (ISORDIL) 20 MG tablet Take 1 tablet (20 mg total) by mouth 3 (three) times daily. 30 tablet 6  . MAGNESIUM-OXIDE 400 (241.3 Mg) MG  tablet Take 400 mg by mouth daily.  11  . omeprazole (PRILOSEC) 20 MG capsule Take 20 mg by mouth daily.  1  . potassium chloride SA (K-DUR,KLOR-CON) 20 MEQ tablet Take 1 tablet (20 mEq total) by mouth daily. 90 tablet 3  . pregabalin (LYRICA) 100 MG capsule Take 1 capsule (100 mg total) by mouth 2 (two) times daily. 60 capsule 3  . ranitidine (ZANTAC) 150 MG tablet Take 150 mg by mouth 2 (two) times daily.    . sacubitril-valsartan (ENTRESTO) 97-103 MG Take 1 tablet by mouth 2 (two) times daily. 60 tablet 6  . torsemide (DEMADEX) 20 MG tablet Take 2 tablets (40 mg total) by mouth every morning AND 1 tablet (20 mg total) every evening. 90 tablet 6  . Vitamin D, Cholecalciferol, 1000 units CAPS Take 1,000 Units by mouth daily.  11   No current facility-administered medications for this encounter.    Allergies  Allergen Reactions  . Codeine   . Coconut Oil Itching    Social History   Socioeconomic History  . Marital status: Divorced    Spouse name: Not on file  . Number of children: Not on file  . Years of education: Not on file  . Highest education level: Not on file  Occupational History  . Occupation: disability    Comment: stopped working in 2000 d/t occupational exposures  Social Needs  . Financial resource strain: Not on file  . Food insecurity:    Worry: Not on file    Inability: Not on file  . Transportation needs:    Medical: Not on file    Non-medical: Not on file  Tobacco Use  . Smoking status: Former Smoker    Types: Cigarettes  . Smokeless tobacco: Never Used  . Tobacco comment: "smoked when I drank"  Substance and Sexual Activity  . 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 Drug use: Never  . Sexual activity: Not Currently  Lifestyle  . Physical activity:    Days per week: Not on file    Minutes per session: Not on file  . Stress: Not on file  Relationships  . Social connections:    Talks on phone:  Not on file    Gets together: Not on file    Attends religious service: Not on file    Active member of club or organization: Not on file    Attends meetings of clubs or organizations: Not on file    Relationship status: Not on file  . Intimate partner violence:    Fear of current or ex partner: Not on file    Emotionally abused: Not on file    Physically abused: Not on file    Forced sexual activity: Not on file  Other Topics Concern  . Not on file  Social History Narrative   Patient lives at home with wife and some of his 10 children. He self-administers his own medications.   Family History  Problem Relation Age of Onset  . Hypertension Mother   . Diabetes Mother   . Hypertension Father   . Diabetes Father   . Diabetes Sister   . Diabetes Brother    Vitals:   08/20/18 1440  BP: 120/84  Pulse: (!) 103  SpO2: 91%  Weight: 120.8 kg (266 lb 6.4 oz)     Wt Readings from Last 3 Encounters:  08/20/18 120.8 kg (266 lb 6.4 oz)  07/24/18 117.9 kg (260 lb)  07/17/18 117.9 kg (260 lb)   PHYSICAL EXAM: General:  Appears chronically ill.  No resp difficulty HEENT: normal Neck: supple. JVP 9-10 . Carotids 2+ bilat; no bruits. No lymphadenopathy or thryomegaly appreciated. Cor: PMI nondisplaced. Regular rate & rhythm. No rubs, gallops or murmurs. Lungs: clear Abdomen: soft, nontender, nondistended. No hepatosplenomegaly. No bruits or masses. Good bowel sounds. Extremities: no cyanosis, clubbing, rash, R and LLE 1+ edema Neuro: alert & orientedx3, cranial nerves grossly intact. moves all 4 extremities w/o difficulty. Affect pleasant   ASSESSMENT & PLAN:  1. Chronic systolic CHF:  Primarily nonischemic cardiomyopathy. Echo in 2017 with EF 15-20%. Medtronic ICD. Echo 10/2017 EF 15-20%, severely dilated RV with severely decreased systolic function. LHC/RHC 11/06/17 showed volume overload with 80% RCA stenosis.  Cardiac index low at 1.91. The degree of coronary disease does not  explain his cardiomyopathy.   Had ECHO 2/4 that showed EF 20-25%.  NYHA IIIb.  Volume status elevated in the setting of increased fluid intake. Increase torsemide to 40 mg twice a day. Check BMET and BNP today.  -  Continue entresto to 97/103 mg BID.  - Continue eplerenone 25 mg daily.   - Continue hydralazine 25 mg tid and isordil 20 tid.   - Continue digoxin 0.125 mg daily. Dig level <0.2 05/2018 - Hold off on bb for now.  Will use bisoprolol (more beta-1 selective in setting of COPD).  2. COPD: Never smoked, but apparently had significant occupational exposure. It appears that he won a lawsuit dealing with the occupational exposure-related COPD.  - Continue 2 liters oxygen PRN. No change. - Oxygen saturations stable.   3. CAD:  LHC in 5/19 with 80-90% proximal RCA stenosis treated with DES to RCA.  This did not cause his cardiomyopathy but is a large RCA.  No s/s ischemia.   - Continue ASA 81 daily.  - Continue atorvastatin  - Continue Plavix x 1 year post-PCI.  4. Polycythemia: Likely related to chronic hypoxia.  - Refuses sleep study. No change.  5. OSA: Refuses sleep study. Wears O2 PRN.  6. DM: He is on empagliflozin per his report though not on med list. 7. LLL Hamartoma - followed by pulmonary at [facility]. No change. In the past he had biopsy.     Follow up in 2-3 weeks to reassess volume status and 12 weeks with Dr Shirlee Latch. I called Zach with HF Paramedicine to ask if he could see one more time. I stressed the importance of medication compliance.     NP 08/20/2018

## 2018-08-20 NOTE — Patient Instructions (Addendum)
Lab work done today. We will notify you of any abnormal lab work.  DECREASE Torsemide 40mg  (2 tabs) twice daily  LIMIT Fluids to 2 liters daily  Follow up with the Advanced Practice Providers in 3 weeks.  Follow up with Dr. Shirlee Latch in 3 months

## 2018-08-23 ENCOUNTER — Telehealth (HOSPITAL_COMMUNITY): Payer: Self-pay

## 2018-08-23 NOTE — Telephone Encounter (Signed)
I called Richard [name] to see if he was available for an appointment today. He advised that he had a meeting at 14:00 and had to go pick up a car before that so he would not be able to meet today. I asked when would be a good time for him to meet and he advised that he would be able to meet any time next week. We agreed to meet at 10:00 on Wednesday, March 4.

## 2018-08-27 ENCOUNTER — Telehealth (HOSPITAL_COMMUNITY): Payer: Self-pay

## 2018-08-27 NOTE — Telephone Encounter (Signed)
I called Mr Kooyman to schedule an appointment. Ms Luana Shu answered and advised that he had just gone to the store but she said I could come any time tomorrow. I advised that I would be there at 13:00 and she said Mr Iuliano would be there.

## 2018-08-28 ENCOUNTER — Telehealth (HOSPITAL_COMMUNITY): Payer: Self-pay

## 2018-08-28 NOTE — Telephone Encounter (Signed)
I had a scheduled appointment with Mr Orea for this afternoon at 13:00. I called Mr Leonti from his front door after knocking for several minutes without answer. He did not answer so I left a message requesting he call me back. As I was about to leave his nephew answered the door and said that Mr Drakos left the house this morning and has not been back all day. He also said he did not know when Mr Sickles would return so I asked that he tell him I came by when he gets home. The nephew agreed.

## 2018-08-29 ENCOUNTER — Other Ambulatory Visit: Payer: Self-pay | Admitting: Cardiology

## 2018-09-02 ENCOUNTER — Ambulatory Visit (INDEPENDENT_AMBULATORY_CARE_PROVIDER_SITE_OTHER): Payer: Medicare Other | Admitting: *Deleted

## 2018-09-02 DIAGNOSIS — I428 Other cardiomyopathies: Secondary | ICD-10-CM

## 2018-09-02 DIAGNOSIS — I5022 Chronic systolic (congestive) heart failure: Secondary | ICD-10-CM

## 2018-09-04 ENCOUNTER — Other Ambulatory Visit (HOSPITAL_COMMUNITY): Payer: Self-pay

## 2018-09-04 NOTE — Progress Notes (Signed)
Paramedicine Encounter    Patient ID: Richard [name], male    DOB: Oct 31, 1960, 58 y.o.   MRN: [id number]   Patient Care Team: Richard Pilgrim, MD as PCP - General (Family Medicine)  Patient Active Problem List   Diagnosis Date Noted  . Acute on chronic combined systolic and diastolic CHF (congestive heart failure) (HCC) 03/15/2018  . S/P right and left heart catheterization 11/02/2017  . Panic attacks 11/02/2017  . Non-STEMI (non-ST elevated myocardial infarction) (HCC)   . COPD (chronic obstructive pulmonary disease) (HCC)   . Hypertension   . CKD (chronic kidney disease)   . 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HCC)   . ICD (implantable cardioverter-defibrillator) in place   . GERD (gastroesophageal reflux disease)   . Diabetes mellitus with diabetic neuropathy, with long-term current use of insulin (HCC)     Current Outpatient Medications:  .  allopurinol (ZYLOPRIM) 100 MG tablet, Take 1 tablet (100 mg total) by mouth daily., Disp: 30 tablet, Rfl: 3 .  aspirin EC 81 MG EC tablet, Take 1 tablet (81 mg total) by mouth daily., Disp: 90 tablet, Rfl: 0 .  atorvastatin (LIPITOR) 40 MG tablet, Take 1 tablet (40 mg total) by mouth daily at 6 PM., Disp: 30 tablet, Rfl: 5 .  BREO ELLIPTA 100-25 MCG/INH AEPB, Inhale 1 puff into the lungs daily., Disp: , Rfl: 5 .  clopidogrel (PLAVIX) 75 MG tablet, Take 1 tablet (75 mg total) by mouth daily with breakfast., Disp: 11 tablet, Rfl: 0 .  digoxin (LANOXIN) 0.125 MG tablet, Take 1 tablet (0.125 mg total) by mouth daily., Disp: 30 tablet, Rfl: 3 .  diphenhydrAMINE (BENADRYL) 25 MG tablet, Take 25 mg by mouth at bedtime as needed., Disp: , Rfl:  .  DULoxetine (CYMBALTA) 60 MG capsule, Take 60 mg by mouth daily., Disp: , Rfl: 2 .  empagliflozin (JARDIANCE) 10 MG TABS tablet, Take 10 mg by mouth daily., Disp: , Rfl:  .  eplerenone (INSPRA) 25 MG tablet, Take 1 tablet (25 mg total) by mouth daily., Disp: 30 tablet, Rfl: 6 .  hydrALAZINE (APRESOLINE) 25 MG tablet,  Take 1 tablet (25 mg total) by mouth 3 (three) times daily., Disp: 270 tablet, Rfl: 3 .  Insulin Pen Needle (NOVOFINE) 30G X 8 MM MISC, Inject 10 each into the skin as needed., Disp: 100 each, Rfl: 0 .  insulin regular human CONCENTRATED (HUMULIN R U-500 KWIKPEN) 500 UNIT/ML kwikpen, USE AS DIRECTED PER SLIDING SCALE. MAX DAILY DOSE IS 400 UNITS., Disp: , Rfl:  .  isosorbide dinitrate (ISORDIL) 20 MG tablet, Take 1 tablet (20 mg total) by mouth 3 (three) times daily., Disp: 30 tablet, Rfl: 6 .  MAGNESIUM-OXIDE 400 (241.3 Mg) MG tablet, Take 400 mg by mouth daily., Disp: , Rfl: 11 .  omeprazole (PRILOSEC) 20 MG capsule, Take 20 mg by mouth daily., Disp: , Rfl: 1 .  potassium chloride SA (K-DUR,KLOR-CON) 20 MEQ tablet, Take 1 tablet (20 mEq total) by mouth daily., Disp: 90 tablet, Rfl: 3 .  pregabalin (LYRICA) 100 MG capsule, Take 1 capsule (100 mg total) by mouth 2 (two) times daily., Disp: 60 capsule, Rfl: 3 .  ranitidine (ZANTAC) 150 MG tablet, Take 150 mg by mouth 2 (two) times daily., Disp: , Rfl:  .  sacubitril-valsartan (ENTRESTO) 97-103 MG, Take 1 tablet by mouth 2 (two) times daily., Disp: 60 tablet, Rfl: 6 .  torsemide (DEMADEX) 20 MG tablet, Take 2 tablets (40 mg total) by mouth 2 (two) times daily., Disp: 180 tablet,  Rfl: 3 .  Vitamin D, Cholecalciferol, 1000 units CAPS, Take 1,000 Units by mouth daily., Disp: , Rfl: 11 Allergies  Allergen Reactions  . Codeine   . Coconut Oil Itching      Social History   Socioeconomic History  . Marital status: Divorced    Spouse name: Not on file  . Number of children: Not on file  . Years of education: Not on file  . Highest education level: Not on file  Occupational History  . Occupation: disability    Comment: stopped working in 2000 d/t occupational exposures  Social Needs  . Financial resource strain: Not on file  . Food insecurity:    Worry: Not on file    Inability: Not on file  . Transportation needs:    Medical: Not on file     Non-medical: Not on file  Tobacco Use  . Smoking status: Former Smoker    Types: Cigarettes  . Smokeless tobacco: Never Used  . Tobacco comment: "smoked when I drank"  Substance and Sexual Activity  . 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 Drug use: Never  . Sexual activity: Not Currently  Lifestyle  . Physical activity:    Days per week: Not on file    Minutes per session: Not on file  . Stress: Not on file  Relationships  . Social connections:    Talks on phone: Not on file    Gets together: Not on file    Attends religious service: Not on file    Active member of club or organization: Not on file    Attends meetings of clubs or organizations: Not on file    Relationship status: Not on file  . Intimate partner violence:    Fear of current or ex partner: Not on file    Emotionally abused: Not on file    Physically abused: Not on file    Forced sexual activity: Not on file  Other Topics Concern  . Not on file  Social History Narrative   Patient lives at home with wife and some of his 10 children. He self-administers his own medications.    Physical Exam Cardiovascular:     Rate and Rhythm: Normal rate and regular rhythm.     Pulses: Normal pulses.  Pulmonary:     Effort: Pulmonary effort is normal.     Breath sounds: Normal breath sounds.  Musculoskeletal:     Right lower leg: Edema present.     Left lower leg: Edema present.  Skin:    General: Skin is warm and dry.     Capillary Refill: Capillary refill takes less than 2 seconds.  Neurological:     Mental Status: He is alert and oriented to person, place, and time.  Psychiatric:        Mood and Affect: Mood normal.         Future Appointments  Date Time Provider Department Center  09/13/2018 12:00 PM [facility] PA/NP [facility] None  11/22/2018  2:00 PM Richard Morale, MD [facility] None    BP 100/76 (BP Location: Left Arm, Patient Position: Sitting,  Cuff Size: Large)   Pulse 60   Resp 16   Wt 253 lb (114.8 kg)   SpO2 92%   BMI 29.24 kg/m   Weight yesterday- 253 lb Last visit weight- N/A   Richard [name] was seen at home today for the first time since his  re-referral. He reported feeling well; denying chest pain, SOB, headache, dizziness or orthopnea. He stated he has been compliant with his medications however he is no longer using a pillbox and all the medication bottles in his bag should have been either empty of very close and they were all mostly full. He insisted that he takes his medications without fail but identifies them by sight only and does not ready the bottles. I explained the problem with this method and he stated that he was comfortable with it and did not want to use the pillboxes anymore. Upon further investigation. I found that he has been taking 162.5 mg of aspirin daily because he thought cutting a 325 mg tablet ion half would be the same as taking the 81 mg he had been taking. I explained that this was not correct and told him he should be careful when cutting pills in half without knowing their dose. He then informed me that he has been cutting his Plavix in half and taking half in the morning and half in the afternoon because it "makes [him] feel better." I contacted Leota Sauers, pharmacist, and she advised that while cutting Plavix in half "is not ideal, it's OK." I asked him why so many of his bottles were full when they should have been either empty or nearly empty and he said he adds new bottles to old ones when he gets a new prescription from the pharmacy.I spoke to him about using pill packs and he was agreeable to this suggestion. I contacted [facility] who stated that they could deliver pill packs to him and would just need [place] to start transferring his prescriptions as they are ready to be refilled. I requested a print out from his pharmacy to be faxed to the HF clinic showing the dates of medication pick ups since  January so I can cross reference the bottles and see what can be sent over to get the process started.  I will follow up on Friday once I have the necessary records.   [name], EMT 09/04/18  ACTION: Home visit completed Next visit planned for Friday.

## 2018-09-05 ENCOUNTER — Telehealth (HOSPITAL_COMMUNITY): Payer: Self-pay

## 2018-09-05 LAB — CUP PACEART REMOTE DEVICE CHECK
Date Time Interrogation Session: 20200312162428
Implantable Lead Implant Date: 20180719
Implantable Lead Implant Date: 20180719
Implantable Lead Location: 753860
Implantable Lead Model: 5076
MDC IDC LEAD LOCATION: 753859
MDC IDC PG IMPLANT DT: 20180719

## 2018-09-05 NOTE — Telephone Encounter (Signed)
I called Richard [name] to let him know that I have been unable to obtain the necessary papers from his pharmacy to see what is ready to be reordered. He did not answer so I left a message requesting he try to get the papers for me from the pharmacy. I will follow up tomorrow.

## 2018-09-09 NOTE — Progress Notes (Signed)
Remote ICD transmission.   

## 2018-09-11 ENCOUNTER — Telehealth (HOSPITAL_COMMUNITY): Payer: Self-pay

## 2018-09-11 NOTE — Telephone Encounter (Signed)
I called Richard [name] to schedule an appointment. He did not answer so I left a voicemail requesting he call me back.  

## 2018-09-11 NOTE — Telephone Encounter (Signed)
Left VM for pt to return call on friend [name]'s phone to resched APP appt 3/20

## 2018-09-12 ENCOUNTER — Telehealth (HOSPITAL_COMMUNITY): Payer: Self-pay | Admitting: Licensed Clinical Social Worker

## 2018-09-12 NOTE — Telephone Encounter (Signed)
CSW reached out to pt to check in regarding food and medication status at this time.   Left voicemail for pt  pt to reach out with any concerns and will continue to follow and assist as needed  Burna Sis, LCSW Clinical Social Worker [facility] [phone]

## 2018-09-13 ENCOUNTER — Encounter (HOSPITAL_COMMUNITY): Payer: Medicare Other

## 2018-09-13 ENCOUNTER — Telehealth (HOSPITAL_COMMUNITY): Payer: Self-pay

## 2018-09-13 NOTE — Telephone Encounter (Signed)
I called Richard [name] whilst standing on his front porch but nobody answered. He left me a voicemail yesterday stating he would be home all day so I could come by any time. I knocked for several minutes and from what I could see through the window, nobody appeared to be home. I will reach out again next week.

## 2018-09-18 ENCOUNTER — Other Ambulatory Visit: Payer: Self-pay

## 2018-09-18 ENCOUNTER — Ambulatory Visit (HOSPITAL_COMMUNITY)
Admission: RE | Admit: 2018-09-18 | Discharge: 2018-09-18 | Disposition: A | Discharge status: Home / Self Care | Payer: Medicare Other | Source: Ambulatory Visit | Attending: Adult Health | Admitting: Adult Health

## 2018-09-18 ENCOUNTER — Encounter (HOSPITAL_COMMUNITY): Payer: Self-pay

## 2018-09-18 ENCOUNTER — Other Ambulatory Visit (HOSPITAL_COMMUNITY): Payer: Self-pay

## 2018-09-18 VITALS — BP 120/77 | HR 82 | Wt 255.0 lb

## 2018-09-18 DIAGNOSIS — I5022 Chronic systolic (congestive) heart failure: Secondary | ICD-10-CM

## 2018-09-18 DIAGNOSIS — I2510 Atherosclerotic heart disease of native coronary artery without angina pectoris: Secondary | ICD-10-CM

## 2018-09-18 MED ORDER — TORSEMIDE 20 MG PO TABS: 20.0000 mg | ORAL_TABLET | Freq: Two times a day (BID) | ORAL | 3 refills | Status: DC

## 2018-09-18 MED ORDER — BISOPROLOL FUMARATE 5 MG PO TABS: 2.5000 mg | ORAL_TABLET | Freq: Every day | ORAL | 11 refills | Status: DC

## 2018-09-18 NOTE — Progress Notes (Signed)
Paramedicine Encounter    Patient ID: Ketih Cordill, male    DOB: 20-Mar-1961, 58 y.o.   MRN: [id number]   Patient Care Team: Ananias Pilgrim, MD as PCP - General (Family Medicine) Laurey Morale, MD as PCP - [facility] (Cardiology)  Patient Active Problem List   Diagnosis Date Noted  . Acute on chronic combined systolic and diastolic CHF (congestive heart failure) (HCC) 03/15/2018  . S/P right and left heart catheterization 11/02/2017  . Panic attacks 11/02/2017  . Non-STEMI (non-ST elevated myocardial infarction) (HCC)   . COPD (chronic obstructive pulmonary disease) (HCC)   . Hypertension   . CKD (chronic kidney disease)   . 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HCC)   . ICD (implantable cardioverter-defibrillator) in place   . GERD (gastroesophageal reflux disease)   . Diabetes mellitus with diabetic neuropathy, with long-term current use of insulin (HCC)     Current Outpatient Medications:  .  allopurinol (ZYLOPRIM) 100 MG tablet, Take 1 tablet (100 mg total) by mouth daily., Disp: 30 tablet, Rfl: 3 .  aspirin EC 81 MG EC tablet, Take 1 tablet (81 mg total) by mouth daily., Disp: 90 tablet, Rfl: 0 .  atorvastatin (LIPITOR) 40 MG tablet, Take 1 tablet (40 mg total) by mouth daily at 6 PM., Disp: 30 tablet, Rfl: 5 .  clopidogrel (PLAVIX) 75 MG tablet, Take 1 tablet (75 mg total) by mouth daily with breakfast., Disp: 11 tablet, Rfl: 0 .  digoxin (LANOXIN) 0.125 MG tablet, Take 1 tablet (0.125 mg total) by mouth daily., Disp: 30 tablet, Rfl: 3 .  diphenhydrAMINE (BENADRYL) 25 MG tablet, Take 25 mg by mouth at bedtime as needed., Disp: , Rfl:  .  DULoxetine (CYMBALTA) 60 MG capsule, Take 60 mg by mouth daily., Disp: , Rfl: 2 .  eplerenone (INSPRA) 25 MG tablet, Take 1 tablet (25 mg total) by mouth daily., Disp: 30 tablet, Rfl: 6 .  hydrALAZINE (APRESOLINE) 25 MG tablet, Take 1 tablet (25 mg total) by mouth 3 (three) times daily., Disp: 270 tablet, Rfl: 3 .  Insulin Pen Needle  (NOVOFINE) 30G X 8 MM MISC, Inject 10 each into the skin as needed., Disp: 100 each, Rfl: 0 .  insulin regular human CONCENTRATED (HUMULIN R U-500 KWIKPEN) 500 UNIT/ML kwikpen, USE AS DIRECTED PER SLIDING SCALE. MAX DAILY DOSE IS 400 UNITS., Disp: , Rfl:  .  isosorbide dinitrate (ISORDIL) 20 MG tablet, Take 1 tablet (20 mg total) by mouth 3 (three) times daily., Disp: 30 tablet, Rfl: 6 .  omeprazole (PRILOSEC) 20 MG capsule, Take 20 mg by mouth daily., Disp: , Rfl: 1 .  potassium chloride SA (K-DUR,KLOR-CON) 20 MEQ tablet, Take 1 tablet (20 mEq total) by mouth daily., Disp: 90 tablet, Rfl: 3 .  sacubitril-valsartan (ENTRESTO) 97-103 MG, Take 1 tablet by mouth 2 (two) times daily., Disp: 60 tablet, Rfl: 6 .  torsemide (DEMADEX) 20 MG tablet, Take 2 tablets (40 mg total) by mouth 2 (two) times daily., Disp: 180 tablet, Rfl: 3 .  Vitamin D, Cholecalciferol, 1000 units CAPS, Take 1,000 Units by mouth daily., Disp: , Rfl: 11 .  BREO ELLIPTA 100-25 MCG/INH AEPB, Inhale 1 puff into the lungs daily., Disp: , Rfl: 5 .  empagliflozin (JARDIANCE) 10 MG TABS tablet, Take 10 mg by mouth daily., Disp: , Rfl:  .  MAGNESIUM-OXIDE 400 (241.3 Mg) MG tablet, Take 400 mg by mouth daily., Disp: , Rfl: 11 .  pregabalin (LYRICA) 100 MG capsule, Take 1 capsule (100 mg total)  by mouth 2 (two) times daily. (Patient not taking: Reported on 09/18/2018), Disp: 60 capsule, Rfl: 3 .  ranitidine (ZANTAC) 150 MG tablet, Take 150 mg by mouth 2 (two) times daily., Disp: , Rfl:  Allergies  Allergen Reactions  . Codeine   . Coconut Oil Itching      Social History   Socioeconomic History  . Marital status: Divorced    Spouse name: Not on file  . Number of children: Not on file  . Years of education: Not on file  . Highest education level: Not on file  Occupational History  . Occupation: disability    Comment: stopped working in 2000 d/t occupational exposures  Social Needs  . Financial resource strain: Not on file  .  Food insecurity:    Worry: Not on file    Inability: Not on file  . Transportation needs:    Medical: Not on file    Non-medical: Not on file  Tobacco Use  . Smoking status: Former Smoker    Types: Cigarettes  . Smokeless tobacco: Never Used  . Tobacco comment: "smoked when I drank"  Substance and Sexual Activity  . 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 Drug use: Never  . Sexual activity: Not Currently  Lifestyle  . Physical activity:    Days per week: Not on file    Minutes per session: Not on file  . Stress: Not on file  Relationships  . Social connections:    Talks on phone: Not on file    Gets together: Not on file    Attends religious service: Not on file    Active member of club or organization: Not on file    Attends meetings of clubs or organizations: Not on file    Relationship status: Not on file  . Intimate partner violence:    Fear of current or ex partner: Not on file    Emotionally abused: Not on file    Physically abused: Not on file    Forced sexual activity: Not on file  Other Topics Concern  . Not on file  Social History Narrative   Patient lives at home with wife and some of his 10 children. He self-administers his own medications.    Physical Exam Cardiovascular:     Rate and Rhythm: Normal rate and regular rhythm.     Pulses: Normal pulses.  Pulmonary:     Effort: Pulmonary effort is normal.     Breath sounds: Normal breath sounds.  Abdominal:     General: Abdomen is flat.  Musculoskeletal: Normal range of motion.     Right lower leg: Edema present.     Left lower leg: Edema present.  Skin:    General: Skin is warm and dry.     Capillary Refill: Capillary refill takes less than 2 seconds.  Neurological:     Mental Status: He is alert and oriented to person, place, and time.  Psychiatric:        Mood and Affect: Mood normal.         Future Appointments  Date Time Provider  Department Center  09/18/2018  4:00 PM [facility] PA/NP [facility] None  10/25/2018 10:00 AM [facility] PA/NP [facility] None  11/22/2018  2:00 PM Laurey Morale, MD [facility] None  12/02/2018  7:00 AM [facility] DEVICE REMOTES [facility]    BP 120/77 (BP Location: Left Arm, Patient Position: Sitting, Cuff Size:  Normal)   Pulse 82   Resp 16   Wt 255 lb (115.7 kg)   SpO2 94%   BMI 29.47 kg/m   Weight yesterday- 254 lb Last visit weight- 253 lb  Mr Tae was seen at home today and reported feeling generally well. He denied chest pain, SOB, headache, dizziness or orthopnea. He stated he has been compliant with his medications but it is difficult to confirm since he refuses to use a pillbox and he has so many extra pills from when he was not taking them in the past. We are working on getting him set up on pill packs. He has been instructed to get a print out from the pharmacy stating when his medications are due for refills (most of the bottles he currently has are far out-dated). He expressed understanding and was agreeable. We contacted Amy Clegg, NP-C, over his concern with how he should be taking torsemide. Amy opted to do a virtual visit since his appointment was canceled last week. She advised he could go back to 20 mg of torsemide BID. She also advised she is adding a beta-blocker and advised they will send the Rx to the pharmacy. I will follow up next week.   [name], EMT 09/18/18  ACTION: Home visit completed Next visit planned for 1 week

## 2018-09-18 NOTE — Patient Instructions (Addendum)
Recommended follow-up: 3 months    Torsemide decrease to 20 mg twice a day.  Start bisoprolol 2.5 mg   Labs/ tests ordered today include: BMET in 4 weeks.

## 2018-09-18 NOTE — Addendum Note (Signed)
Encounter addended by: Modesta Messing, CMA on: 09/18/2018 4:42 PM  Actions taken: Pharmacy for encounter modified, Order list changed, Clinical Note Signed

## 2018-09-18 NOTE — Progress Notes (Signed)
Heart Failure TeleHealth Note  Due to national recommendations of social distancing due to COVID 19, Audio/video telehealth visit is felt to be most appropriate for this patient at this time. He has given verbal consent for virtual visit/telehealth for [facility].  Date:  09/18/2018   ID:  Richard [name], DOB 1961/06/04, MRN [id number]  Location: Home  Provider location: [address] [place] Type of Visit: Established patient  PCP:  Ananias Pilgrim, MD  Cardiologist:  No primary care provider on file. Primary HF: Dr Shirlee Latch    Chief Complaint: Heart Failure   History of Present Illness: Richard [name] is a 58 y.o. male who presents via audio/video conferencing for a telehealth visit today.     Richard [name] is a 58 y.o. male with a history of chronic systolic HF s/p Medtronic ICD (12/2016), HTN, DM, left hamartoma,and HLD.   RHC/LHC (5/19): 80% distal LAD stenosis, 80% proximal RCA stenosis treated with DES to pRCA  Echo 07/30/18 EF 20-25%.  RV moderately reduced.   Today, he denies symptoms of palpitations, chest pain, orthopnea, PND, lower extremity edema, claudication, dizziness, presyncope, syncope, or bleeding. Mild dyspnea with exertion. The patient is tolerating medications without difficulties and is otherwise without complaint today. Weight at home has been 255 pounds.  Appetite stable. He has limited fluid intake to < 2 liters per day. He has been more compliant with medications.  He  denies symptoms of cough, fevers, chills, or new SOB worrisome for COVID 19.   Past Medical History:  Diagnosis Date  . AICD (automatic cardioverter/defibrillator) present 01/11/2017  . Anxiety   . CKD (chronic kidney disease)   . COPD (chronic obstructive pulmonary disease) (HCC)    never smoker, industrial exposure  . Diabetes mellitus with diabetic neuropathy, with long-term current use of insulin (HCC)   . GERD (gastroesophageal reflux disease)   . High cholesterol   .  History of gout    "take RX qd" (11/01/2017)  . Hypertension   . Nonobstructive atherosclerosis of coronary artery   . On home oxygen therapy    "2L prn" (11/01/2017)  . 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HCC)    LLL, present for years  . Systolic HF (heart failure) [facility])    Past Surgical History:  Procedure Laterality Date  . CATARACT EXTRACTION W/ INTRAOCULAR LENS IMPLANTW/ TRABECULECTOMY Left ~ 2013   "may have done this when I had my other eye OR"  . CIRCUMCISION    . CORONARY STENT INTERVENTION N/A 11/02/2017   Procedure: CORONARY STENT INTERVENTION;  Surgeon: Laurey Morale, MD;  Location: [facility] INVASIVE CV LAB;  Service: Cardiovascular;  Laterality: N/A;  . CORONARY STENT INTERVENTION N/A 11/02/2017   Procedure: CORONARY STENT INTERVENTION;  Surgeon: Lyn Records, MD;  Location: [facility] INVASIVE CV LAB;  Service: Cardiovascular;  Laterality: N/A;  . EYE SURGERY Left ~ 2013   "fell on something in basement; stuck in my eye"  . ICD IMPLANT     Medtronic Dual Chamber 01/11/17  . MULTIPLE TOOTH EXTRACTIONS     "pulled all my top teeth"  . RIGHT/LEFT HEART CATH AND CORONARY ANGIOGRAPHY N/A 11/02/2017   Procedure: RIGHT/LEFT HEART CATH AND CORONARY ANGIOGRAPHY;  Surgeon: Laurey Morale, MD;  Location: [facility] INVASIVE CV LAB;  Service: Cardiovascular;  Laterality: N/A;     Current Outpatient Medications  Medication Sig Dispense Refill  . allopurinol (ZYLOPRIM) 100 MG tablet Take 1 tablet (100 mg total) by mouth daily. 30 tablet 3  .  aspirin EC 81 MG EC tablet Take 1 tablet (81 mg total) by mouth daily. 90 tablet 0  . atorvastatin (LIPITOR) 40 MG tablet Take 1 tablet (40 mg total) by mouth daily at 6 PM. 30 tablet 5  . BREO ELLIPTA 100-25 MCG/INH AEPB Inhale 1 puff into the lungs daily.  5  . clopidogrel (PLAVIX) 75 MG tablet Take 1 tablet (75 mg total) by mouth daily with breakfast. 11 tablet 0  . digoxin (LANOXIN) 0.125 MG tablet Take 1 tablet (0.125 mg total) by mouth daily. 30 tablet 3  .  diphenhydrAMINE (BENADRYL) 25 MG tablet Take 25 mg by mouth at bedtime as needed.    . DULoxetine (CYMBALTA) 60 MG capsule Take 60 mg by mouth daily.  2  . empagliflozin (JARDIANCE) 10 MG TABS tablet Take 10 mg by mouth daily.    Marland Kitchen eplerenone (INSPRA) 25 MG tablet Take 1 tablet (25 mg total) by mouth daily. 30 tablet 6  . hydrALAZINE (APRESOLINE) 25 MG tablet Take 1 tablet (25 mg total) by mouth 3 (three) times daily. 270 tablet 3  . Insulin Pen Needle (NOVOFINE) 30G X 8 MM MISC Inject 10 each into the skin as needed. 100 each 0  . insulin regular human CONCENTRATED (HUMULIN R U-500 KWIKPEN) 500 UNIT/ML kwikpen USE AS DIRECTED PER SLIDING SCALE. MAX DAILY DOSE IS 400 UNITS.    Marland Kitchen isosorbide dinitrate (ISORDIL) 20 MG tablet Take 1 tablet (20 mg total) by mouth 3 (three) times daily. 30 tablet 6  . MAGNESIUM-OXIDE 400 (241.3 Mg) MG tablet Take 400 mg by mouth daily.  11  . omeprazole (PRILOSEC) 20 MG capsule Take 20 mg by mouth daily.  1  . potassium chloride SA (K-DUR,KLOR-CON) 20 MEQ tablet Take 1 tablet (20 mEq total) by mouth daily. 90 tablet 3  . pregabalin (LYRICA) 100 MG capsule Take 1 capsule (100 mg total) by mouth 2 (two) times daily. (Patient not taking: Reported on 09/18/2018) 60 capsule 3  . ranitidine (ZANTAC) 150 MG tablet Take 150 mg by mouth 2 (two) times daily.    . sacubitril-valsartan (ENTRESTO) 97-103 MG Take 1 tablet by mouth 2 (two) times daily. 60 tablet 6  . torsemide (DEMADEX) 20 MG tablet Take 2 tablets (40 mg total) by mouth 2 (two) times daily. 180 tablet 3  . Vitamin D, Cholecalciferol, 1000 units CAPS Take 1,000 Units by mouth daily.  11   No current facility-administered medications for this encounter.     Allergies:   Codeine and Coconut oil   Social History:  The patient  reports that he has quit smoking. His smoking use included cigarettes. He has never used smokeless tobacco. He reports previous alcohol use. He reports that he does not use drugs.   Family  History:  The patient's family history includes Diabetes in his brother, father, mother, and sister; Hypertension in his father and mother.   ROS:  Please see the history of present illness.   All other systems are personally reviewed and negative.  Today's Vitals   09/18/18 1517  BP: 120/77  Pulse: 82  SpO2: 94%  Weight: 115.7 kg (255 lb)   Body mass index is 29.47 kg/m.  Exam:  (Video Health Call; Exam is visual.) Exam completed with Johnnye Sima Paramedic General:  Well appearing. No resp difficulty. HEENT: Normal Neck: JVD does not appear elevated.  Cor: regular  Lungs: Normal respiratory effort with conversation.  Abdomen: Non-distended. Pt denies tenderness with self palpation.  Extremities: Trace edema.  Neuro: Alert & oriented x 3.   Recent Labs: 07/30/2018: ALT 33; Hemoglobin 17.7; Platelets 280 08/20/2018: B Natriuretic Peptide 128.4; BUN 18; Creatinine, Ser 1.03; Potassium 4.1; Sodium 140  Personally reviewed   Wt Readings from Last 3 Encounters:  09/18/18 115.7 kg (255 lb)  09/18/18 115.7 kg (255 lb)  09/04/18 114.8 kg (253 lb)      Other studies personally reviewed: Additional studies/ records that were reviewed today include:  ASSESSMENT AND PLAN:  1.  Chronic Systolic Heart Failure  Primarily nonischemic cardiomyopathy. Echo in 2017 with EF 15-20%. Medtronic ICD. Echo 10/2017 EF 15-20%, severely dilated RV with severely decreased systolic function. LHC/RHC 11/06/17 showed volume overload with 80% RCA stenosis.  NYHA II. Volume status improved likely due to improved diet and reducing fluid intake. . Cut back torsemide to 20 mg twice a day. Check BMET in 4 weeks.  -Add bisoprolol 2.5 mg daily.  -Continue digoxin 0.125 mg daily.  -Continue entresto 97-103 twice a day.  -Continue inspra 25 mg daily.  - Check BMET in 4 week.s   2. CAD  LHC in 5/19 with 80-90% proximal RCA stenosis treated with DES to RCA.  This did not cause his cardiomyopathy but is a large  RCA. -No s/s ischemia.  -Continue asa, atorvastatin, and plavix  -He will be able to stop plavix in May 2020.   COVID screen The patient does not have any symptoms that suggest any further testing/ screening at this time.  Social distancing reinforced today.  Recommended follow-up:  3 months   Relevant cardiac medications were reviewed at length with the patient today.   The patient does not have concerns regarding their medications at this time.   The following changes were made today:  Torsemide was cut back to 20 mg twice a day and bisoprolol 2.5 mg daily was added.   Labs/ tests ordered today include: BMET in 4 weeks.   Patient Risk: After full review of this patients clinical status, I feel that they are at moderate risk for cardiac decompensation at this time.  Today, I have spent 25 minutes with the patient with telehealth technology discussing heart failure and medication changes.    Waneta Martins, NP  09/18/2018 3:19 PM  [facility] [address] [facility] [place] [address] [phone] (office) [phone] (fax)

## 2018-09-25 ENCOUNTER — Other Ambulatory Visit (HOSPITAL_COMMUNITY): Payer: Self-pay

## 2018-09-25 NOTE — Progress Notes (Signed)
Paramedicine Encounter    Patient ID: Richard [name], male    DOB: [birth year], 58 y.o.   MRN: [id number]   Patient Care Team: Ananias Pilgrim, MD as PCP - General (Family Medicine) Laurey Morale, MD as PCP - Advanced Heart Failure (Cardiology)  Patient Active Problem List   Diagnosis Date Noted  . Acute on chronic combined systolic and diastolic CHF (congestive heart failure) (HCC) 03/15/2018  . S/P right and left heart catheterization 11/02/2017  . Panic attacks 11/02/2017  . Non-STEMI (non-ST elevated myocardial infarction) (HCC)   . COPD (chronic obstructive pulmonary disease) (HCC)   . Hypertension   . CKD (chronic kidney disease)   . 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HCC)   . ICD (implantable cardioverter-defibrillator) in place   . GERD (gastroesophageal reflux disease)   . Diabetes mellitus with diabetic neuropathy, with long-term current use of insulin (HCC)     Current Outpatient Medications:  .  allopurinol (ZYLOPRIM) 100 MG tablet, Take 1 tablet (100 mg total) by mouth daily., Disp: 30 tablet, Rfl: 3 .  aspirin EC 81 MG EC tablet, Take 1 tablet (81 mg total) by mouth daily., Disp: 90 tablet, Rfl: 0 .  atorvastatin (LIPITOR) 40 MG tablet, Take 1 tablet (40 mg total) by mouth daily at 6 PM., Disp: 30 tablet, Rfl: 5 .  bisoprolol (ZEBETA) 5 MG tablet, Take 0.5 tablets (2.5 mg total) by mouth daily., Disp: 15 tablet, Rfl: 11 .  BREO ELLIPTA 100-25 MCG/INH AEPB, Inhale 1 puff into the lungs daily., Disp: , Rfl: 5 .  clopidogrel (PLAVIX) 75 MG tablet, Take 1 tablet (75 mg total) by mouth daily with breakfast., Disp: 11 tablet, Rfl: 0 .  digoxin (LANOXIN) 0.125 MG tablet, Take 1 tablet (0.125 mg total) by mouth daily., Disp: 30 tablet, Rfl: 3 .  diphenhydrAMINE (BENADRYL) 25 MG tablet, Take 25 mg by mouth at bedtime as needed., Disp: , Rfl:  .  DULoxetine (CYMBALTA) 60 MG capsule, Take 60 mg by mouth daily., Disp: , Rfl: 2 .  empagliflozin (JARDIANCE) 10 MG TABS tablet, Take 10 mg by  mouth daily., Disp: , Rfl:  .  eplerenone (INSPRA) 25 MG tablet, Take 1 tablet (25 mg total) by mouth daily., Disp: 30 tablet, Rfl: 6 .  hydrALAZINE (APRESOLINE) 25 MG tablet, Take 1 tablet (25 mg total) by mouth 3 (three) times daily., Disp: 270 tablet, Rfl: 3 .  Insulin Pen Needle (NOVOFINE) 30G X 8 MM MISC, Inject 10 each into the skin as needed., Disp: 100 each, Rfl: 0 .  insulin regular human CONCENTRATED (HUMULIN R U-500 KWIKPEN) 500 UNIT/ML kwikpen, USE AS DIRECTED PER SLIDING SCALE. MAX DAILY DOSE IS 400 UNITS., Disp: , Rfl:  .  isosorbide dinitrate (ISORDIL) 20 MG tablet, Take 1 tablet (20 mg total) by mouth 3 (three) times daily., Disp: 30 tablet, Rfl: 6 .  MAGNESIUM-OXIDE 400 (241.3 Mg) MG tablet, Take 400 mg by mouth daily., Disp: , Rfl: 11 .  omeprazole (PRILOSEC) 20 MG capsule, Take 20 mg by mouth daily., Disp: , Rfl: 1 .  potassium chloride SA (K-DUR,KLOR-CON) 20 MEQ tablet, Take 1 tablet (20 mEq total) by mouth daily., Disp: 90 tablet, Rfl: 3 .  pregabalin (LYRICA) 100 MG capsule, Take 1 capsule (100 mg total) by mouth 2 (two) times daily. (Patient not taking: Reported on 09/18/2018), Disp: 60 capsule, Rfl: 3 .  ranitidine (ZANTAC) 150 MG tablet, Take 150 mg by mouth 2 (two) times daily., Disp: , Rfl:  .  sacubitril-valsartan (ENTRESTO)  97-103 MG, Take 1 tablet by mouth 2 (two) times daily., Disp: 60 tablet, Rfl: 6 .  torsemide (DEMADEX) 20 MG tablet, Take 1 tablet (20 mg total) by mouth 2 (two) times daily., Disp: 180 tablet, Rfl: 3 .  Vitamin D, Cholecalciferol, 1000 units CAPS, Take 1,000 Units by mouth daily., Disp: , Rfl: 11 Allergies  Allergen Reactions  . Codeine   . Coconut Oil Itching      Social History   Socioeconomic History  . Marital status: Divorced    Spouse name: Not on file  . Number of children: Not on file  . Years of education: Not on file  . Highest education level: Not on file  Occupational History  . Occupation: disability    Comment: stopped  working in 2000 d/t occupational exposures  Social Needs  . Financial resource strain: Not on file  . Food insecurity:    Worry: Not on file    Inability: Not on file  . Transportation needs:    Medical: Not on file    Non-medical: Not on file  Tobacco Use  . Smoking status: Former Smoker    Types: Cigarettes  . Smokeless tobacco: Never Used  . Tobacco comment: "smoked when I drank"  Substance and Sexual Activity  . 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 Drug use: Never  . Sexual activity: Not Currently  Lifestyle  . Physical activity:    Days per week: Not on file    Minutes per session: Not on file  . Stress: Not on file  Relationships  . Social connections:    Talks on phone: Not on file    Gets together: Not on file    Attends religious service: Not on file    Active member of club or organization: Not on file    Attends meetings of clubs or organizations: Not on file    Relationship status: Not on file  . Intimate partner violence:    Fear of current or ex partner: Not on file    Emotionally abused: Not on file    Physically abused: Not on file    Forced sexual activity: Not on file  Other Topics Concern  . Not on file  Social History Narrative   Patient lives at home with wife and some of his 10 children. He self-administers his own medications.    Physical Exam Cardiovascular:     Rate and Rhythm: Normal rate and regular rhythm.  Pulmonary:     Effort: Pulmonary effort is normal.     Breath sounds: Normal breath sounds.  Abdominal:     General: There is no distension.  Musculoskeletal: Normal range of motion.     Right lower leg: Edema present.     Left lower leg: Edema present.  Skin:    General: Skin is warm and dry.     Capillary Refill: Capillary refill takes less than 2 seconds.  Neurological:     Mental Status: He is alert and oriented to person, place, and time.  Psychiatric:        Mood  and Affect: Mood normal.         Future Appointments  Date Time Provider Department Center  10/25/2018 10:00 AM [facility] PA/NP [facility] None  11/22/2018  2:00 PM Laurey Morale, MD [facility] None  12/02/2018  7:00 AM [facility] DEVICE REMOTES [facility]    BP 107/72 (BP Location: Left  Arm, Patient Position: Sitting, Cuff Size: Normal)   Pulse 80   Resp 16   Wt 254 lb (115.2 kg)   SpO2 90%   BMI 29.35 kg/m   Weight yesterday- 260 lb Last visit weight- 255 lb  Mr Gillman was seen at home today and reported feeling well. He denied chest pain, SOB, headache, dizziness or orthopnea. He stated he has been compliant with his medications over the past week and his weight is relatively stable. He advised his weight increased several pounds since I saw him last week but had come back down over the past few days. His medications were verified. He also obtained the medication list from the pharmacy with the "Last Fill Date" so we can begin looking at starting pill packs via [facility]. I will contact them next week since that will be when nearly all of his medications ill be refillable.   [name], EMT 09/25/18  ACTION: Home visit completed Next visit planned for 1 week

## 2018-09-26 ENCOUNTER — Telehealth (HOSPITAL_COMMUNITY): Payer: Self-pay | Admitting: Licensed Clinical Social Worker

## 2018-09-26 NOTE — Telephone Encounter (Signed)
Patient identified as a candidate to receive 14 heart healthy meals per week for 3 months through [facility] partnership with [employer] program.   Completed referral sent in for review.  Anticipate patient will receive first shipment of food in 1-3 business days.  Kylie Simmonds H. Destin Kittler, LCSW Clinical Social Worker [facility] Desk#: [phone] Cell#: [phone]   

## 2018-09-29 ENCOUNTER — Other Ambulatory Visit (HOSPITAL_COMMUNITY): Payer: Self-pay | Admitting: Cardiology

## 2018-09-30 ENCOUNTER — Telehealth (HOSPITAL_COMMUNITY): Payer: Self-pay | Admitting: Licensed Clinical Social Worker

## 2018-09-30 ENCOUNTER — Other Ambulatory Visit (HOSPITAL_COMMUNITY): Payer: Self-pay | Admitting: Cardiology

## 2018-09-30 NOTE — Telephone Encounter (Signed)
CSW reached out to pt to check in regarding food and medication status at this time.  Pt reports that he has no concerns at this time and has family available to help with his needs.  CSW inquired about [employer]- pt has not received first shipment yet but should receive either today or tomorrow.  CSW encouraged pt to reach out with any concerns and will continue to follow and assist as needed  Burna Sis, LCSW Clinical Social Worker [facility] [phone]

## 2018-10-02 ENCOUNTER — Other Ambulatory Visit (HOSPITAL_COMMUNITY): Payer: Self-pay

## 2018-10-02 ENCOUNTER — Telehealth (HOSPITAL_COMMUNITY): Payer: Self-pay | Admitting: Licensed Clinical Social Worker

## 2018-10-02 ENCOUNTER — Other Ambulatory Visit: Payer: Self-pay | Admitting: Internal Medicine

## 2018-10-02 NOTE — Telephone Encounter (Signed)
CSW received call from community paramedic informing CSW that pt still hs not received food from [employer] program.    CSW called [employer]- they do not see where shipment was initiated so will have to reprocess the referral- CSW resent required information- they will send pt food within 1-3 business days.  CSW will continue to follow and assist as needed  Burna Sis, LCSW Clinical Social Worker [facility] Desk#: [phone] Cell#: [phone]

## 2018-10-02 NOTE — Progress Notes (Signed)
Paramedicine Encounter    Patient ID: Richard [name], male    DOB: 05-26-1961, 58 y.o.   MRN: [id number]   Patient Care Team: Ananias Pilgrim, MD as PCP - General (Family Medicine) Laurey Morale, MD as PCP - Advanced Heart Failure (Cardiology)  Patient Active Problem List   Diagnosis Date Noted  . Acute on chronic combined systolic and diastolic CHF (congestive heart failure) (HCC) 03/15/2018  . S/P right and left heart catheterization 11/02/2017  . Panic attacks 11/02/2017  . Non-STEMI (non-ST elevated myocardial infarction) (HCC)   . COPD (chronic obstructive pulmonary disease) (HCC)   . Hypertension   . CKD (chronic kidney disease)   . 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HCC)   . ICD (implantable cardioverter-defibrillator) in place   . GERD (gastroesophageal reflux disease)   . Diabetes mellitus with diabetic neuropathy, with long-term current use of insulin (HCC)     Current Outpatient Medications:  .  allopurinol (ZYLOPRIM) 100 MG tablet, Take 1 tablet (100 mg total) by mouth daily., Disp: 30 tablet, Rfl: 3 .  aspirin EC 81 MG EC tablet, Take 1 tablet (81 mg total) by mouth daily., Disp: 90 tablet, Rfl: 0 .  atorvastatin (LIPITOR) 40 MG tablet, TAKE 1 TABLET(40 MG) BY MOUTH DAILY AT 6 PM, Disp: 90 tablet, Rfl: 3 .  bisoprolol (ZEBETA) 5 MG tablet, Take 0.5 tablets (2.5 mg total) by mouth daily., Disp: 15 tablet, Rfl: 11 .  BREO ELLIPTA 100-25 MCG/INH AEPB, Inhale 1 puff into the lungs daily., Disp: , Rfl: 5 .  clopidogrel (PLAVIX) 75 MG tablet, TAKE 1 TABLET(75 MG) BY MOUTH DAILY WITH BREAKFAST, Disp: 30 tablet, Rfl: 1 .  digoxin (LANOXIN) 0.125 MG tablet, Take 1 tablet (0.125 mg total) by mouth daily., Disp: 30 tablet, Rfl: 3 .  diphenhydrAMINE (BENADRYL) 25 MG tablet, Take 25 mg by mouth at bedtime as needed., Disp: , Rfl:  .  DULoxetine (CYMBALTA) 60 MG capsule, Take 60 mg by mouth daily., Disp: , Rfl: 2 .  eplerenone (INSPRA) 25 MG tablet, Take 1 tablet (25 mg total) by mouth  daily., Disp: 30 tablet, Rfl: 6 .  hydrALAZINE (APRESOLINE) 25 MG tablet, Take 1 tablet (25 mg total) by mouth 3 (three) times daily., Disp: 270 tablet, Rfl: 3 .  Insulin Pen Needle (NOVOFINE) 30G X 8 MM MISC, Inject 10 each into the skin as needed., Disp: 100 each, Rfl: 0 .  insulin regular human CONCENTRATED (HUMULIN R U-500 KWIKPEN) 500 UNIT/ML kwikpen, USE AS DIRECTED PER SLIDING SCALE. MAX DAILY DOSE IS 400 UNITS., Disp: , Rfl:  .  isosorbide dinitrate (ISORDIL) 20 MG tablet, Take 1 tablet (20 mg total) by mouth 3 (three) times daily., Disp: 30 tablet, Rfl: 6 .  omeprazole (PRILOSEC) 20 MG capsule, Take 20 mg by mouth daily., Disp: , Rfl: 1 .  potassium chloride SA (K-DUR,KLOR-CON) 20 MEQ tablet, Take 1 tablet (20 mEq total) by mouth daily., Disp: 90 tablet, Rfl: 3 .  pregabalin (LYRICA) 100 MG capsule, Take 1 capsule (100 mg total) by mouth 2 (two) times daily., Disp: 60 capsule, Rfl: 3 .  sacubitril-valsartan (ENTRESTO) 97-103 MG, Take 1 tablet by mouth 2 (two) times daily., Disp: 60 tablet, Rfl: 6 .  torsemide (DEMADEX) 20 MG tablet, Take 1 tablet (20 mg total) by mouth 2 (two) times daily., Disp: 180 tablet, Rfl: 3 .  empagliflozin (JARDIANCE) 10 MG TABS tablet, Take 10 mg by mouth daily., Disp: , Rfl:  .  MAGNESIUM-OXIDE 400 (241.3 Mg) MG tablet,  Take 400 mg by mouth daily., Disp: , Rfl: 11 .  ranitidine (ZANTAC) 150 MG tablet, Take 150 mg by mouth 2 (two) times daily., Disp: , Rfl:  .  Vitamin D, Cholecalciferol, 1000 units CAPS, Take 1,000 Units by mouth daily., Disp: , Rfl: 11 Allergies  Allergen Reactions  . Codeine   . Coconut Oil Itching      Social History   Socioeconomic History  . Marital status: Divorced    Spouse name: Not on file  . Number of children: Not on file  . Years of education: Not on file  . Highest education level: Not on file  Occupational History  . Occupation: disability    Comment: stopped working in 2000 d/t occupational exposures  Social Needs   . Financial resource strain: Not on file  . Food insecurity:    Worry: Not on file    Inability: Not on file  . Transportation needs:    Medical: Not on file    Non-medical: Not on file  Tobacco Use  . Smoking status: Former Smoker    Types: Cigarettes  . Smokeless tobacco: Never Used  . Tobacco comment: "smoked when I drank"  Substance and Sexual Activity  . 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 Drug use: Never  . Sexual activity: Not Currently  Lifestyle  . Physical activity:    Days per week: Not on file    Minutes per session: Not on file  . Stress: Not on file  Relationships  . Social connections:    Talks on phone: Not on file    Gets together: Not on file    Attends religious service: Not on file    Active member of club or organization: Not on file    Attends meetings of clubs or organizations: Not on file    Relationship status: Not on file  . Intimate partner violence:    Fear of current or ex partner: Not on file    Emotionally abused: Not on file    Physically abused: Not on file    Forced sexual activity: Not on file  Other Topics Concern  . Not on file  Social History Narrative   Patient lives at home with wife and some of his 10 children. He self-administers his own medications.    Physical Exam Cardiovascular:     Rate and Rhythm: Normal rate and regular rhythm.     Pulses: Normal pulses.  Pulmonary:     Effort: Pulmonary effort is normal.     Breath sounds: Normal breath sounds.  Musculoskeletal: Normal range of motion.     Right lower leg: Edema present.     Left lower leg: Edema present.  Skin:    General: Skin is warm and dry.     Capillary Refill: Capillary refill takes less than 2 seconds.  Neurological:     Mental Status: He is alert and oriented to person, place, and time.  Psychiatric:        Mood and Affect: Mood normal.         Future Appointments  Date Time  Provider Department Center  10/25/2018 10:00 AM [facility] PA/NP [facility] None  11/22/2018  2:00 PM Laurey MoraleMcLean, Dalton S, MD [facility] None  12/02/2018  7:00 AM [facility] DEVICE REMOTES [facility]    BP 93/60 (BP Location: Left Arm, Patient Position: Sitting, Cuff Size: Normal)   Pulse 85   Resp  16   Wt 258 lb (117 kg)   SpO2 94%   BMI 29.81 kg/m   Weight yesterday- 259 lb Last visit weight- 254 lb  Mr Dempewolf was seen at home today and reported feeling generally well. He denied chest pain, SOB, headache, orthopnea, cough or fever. He did say he had an episode of dizziness last night which last approximately 2-3 minutes and resolved after he sat and drank some water. He stated he has been compliant with his medications though it is impossible to verify this since he was refusing to use a pillbox and he has so many pills from old prescriptions. Today we were supposed to be working on switching pharmacies so he could start getting pill packs however he picked up several med refills yesterday which pushed [place] back another three months. I advised him to not pick up and more medications from [facility] without speaking with me first since we are trying to switch him over to the new pharmacy and he expressed understanding. Since he is not able to switch to pill packs, I have convinced him to use his pillbox, just until we can get everything switched over. I explained that this will be a long process and he expressed understanding. His medications were verified and he was found to be missing Jardiance and Mag-Oxide. Additionally, the pharmacy filled the wrong strength of Entresto. I contacted [facility] and they advised they would get those medications ready for pick up tomorrow. I advised Mr Sittner to pick these medications up and add one Mag-Oxide pill and one Jardiance pill to each morning slot of his pillbox. He was understanding and agreeable. I will follow up next week.    [name],  EMT 10/02/18  ACTION: Home visit completed Next visit planned for 1 week

## 2018-10-08 ENCOUNTER — Telehealth (HOSPITAL_COMMUNITY): Payer: Self-pay

## 2018-10-08 NOTE — Telephone Encounter (Signed)
I called Mr Richard [name] to schedule an appointment. His wife answered and advised that he was taking her son to work but said he would be available in the morning tomorrow. I asked that she let Mr Tonthat know that I will be there around 11:30 and she was agreeable.

## 2018-10-09 ENCOUNTER — Other Ambulatory Visit (HOSPITAL_COMMUNITY): Payer: Self-pay

## 2018-10-09 NOTE — Progress Notes (Signed)
Paramedicine Encounter    Patient ID: Richard [name], male    DOB: December 04, 1960, 58 y.o.   MRN: [id number]   Patient Care Team: Ananias Pilgrim, MD as PCP - General (Family Medicine) Laurey Morale, MD as PCP - Advanced Heart Failure (Cardiology)  Patient Active Problem List   Diagnosis Date Noted  . Acute on chronic combined systolic and diastolic CHF (congestive heart failure) (HCC) 03/15/2018  . S/P right and left heart catheterization 11/02/2017  . Panic attacks 11/02/2017  . Non-STEMI (non-ST elevated myocardial infarction) (HCC)   . COPD (chronic obstructive pulmonary disease) (HCC)   . Hypertension   . CKD (chronic kidney disease)   . 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HCC)   . ICD (implantable cardioverter-defibrillator) in place   . GERD (gastroesophageal reflux disease)   . Diabetes mellitus with diabetic neuropathy, with long-term current use of insulin (HCC)     Current Outpatient Medications:  .  allopurinol (ZYLOPRIM) 100 MG tablet, Take 1 tablet (100 mg total) by mouth daily., Disp: 30 tablet, Rfl: 3 .  aspirin EC 81 MG EC tablet, Take 1 tablet (81 mg total) by mouth daily., Disp: 90 tablet, Rfl: 0 .  atorvastatin (LIPITOR) 40 MG tablet, TAKE 1 TABLET(40 MG) BY MOUTH DAILY AT 6 PM, Disp: 90 tablet, Rfl: 3 .  bisoprolol (ZEBETA) 5 MG tablet, Take 0.5 tablets (2.5 mg total) by mouth daily., Disp: 15 tablet, Rfl: 11 .  BREO ELLIPTA 100-25 MCG/INH AEPB, Inhale 1 puff into the lungs daily., Disp: , Rfl: 5 .  clopidogrel (PLAVIX) 75 MG tablet, TAKE 1 TABLET(75 MG) BY MOUTH DAILY WITH BREAKFAST, Disp: 30 tablet, Rfl: 1 .  digoxin (LANOXIN) 0.125 MG tablet, Take 1 tablet (0.125 mg total) by mouth daily., Disp: 30 tablet, Rfl: 3 .  diphenhydrAMINE (BENADRYL) 25 MG tablet, Take 25 mg by mouth at bedtime as needed., Disp: , Rfl:  .  DULoxetine (CYMBALTA) 60 MG capsule, Take 60 mg by mouth daily., Disp: , Rfl: 2 .  eplerenone (INSPRA) 25 MG tablet, Take 1 tablet (25 mg total) by mouth  daily., Disp: 30 tablet, Rfl: 6 .  hydrALAZINE (APRESOLINE) 25 MG tablet, Take 1 tablet (25 mg total) by mouth 3 (three) times daily., Disp: 270 tablet, Rfl: 3 .  Insulin Pen Needle (NOVOFINE) 30G X 8 MM MISC, Inject 10 each into the skin as needed., Disp: 100 each, Rfl: 0 .  insulin regular human CONCENTRATED (HUMULIN R U-500 KWIKPEN) 500 UNIT/ML kwikpen, USE AS DIRECTED PER SLIDING SCALE. MAX DAILY DOSE IS 400 UNITS., Disp: , Rfl:  .  isosorbide dinitrate (ISORDIL) 20 MG tablet, Take 1 tablet (20 mg total) by mouth 3 (three) times daily., Disp: 30 tablet, Rfl: 6 .  omeprazole (PRILOSEC) 20 MG capsule, Take 20 mg by mouth daily., Disp: , Rfl: 1 .  potassium chloride SA (K-DUR,KLOR-CON) 20 MEQ tablet, Take 1 tablet (20 mEq total) by mouth daily., Disp: 90 tablet, Rfl: 3 .  pregabalin (LYRICA) 100 MG capsule, Take 1 capsule (100 mg total) by mouth 2 (two) times daily., Disp: 60 capsule, Rfl: 3 .  sacubitril-valsartan (ENTRESTO) 97-103 MG, Take 1 tablet by mouth 2 (two) times daily., Disp: 60 tablet, Rfl: 6 .  torsemide (DEMADEX) 20 MG tablet, Take 1 tablet (20 mg total) by mouth 2 (two) times daily., Disp: 180 tablet, Rfl: 3 .  Vitamin D, Cholecalciferol, 1000 units CAPS, Take 1,000 Units by mouth daily., Disp: , Rfl: 11 .  empagliflozin (JARDIANCE) 10 MG TABS tablet,  Take 10 mg by mouth daily. , Disp: , Rfl:  .  MAGNESIUM-OXIDE 400 (241.3 Mg) MG tablet, Take 400 mg by mouth daily., Disp: , Rfl: 11 .  ranitidine (ZANTAC) 150 MG tablet, Take 150 mg by mouth 2 (two) times daily., Disp: , Rfl:  Allergies  Allergen Reactions  . Codeine   . Coconut Oil Itching      Social History   Socioeconomic History  . Marital status: Divorced    Spouse name: Not on file  . Number of children: Not on file  . Years of education: Not on file  . Highest education level: Not on file  Occupational History  . Occupation: disability    Comment: stopped working in 2000 d/t occupational exposures  Social Needs   . Financial resource strain: Not on file  . Food insecurity:    Worry: Not on file    Inability: Not on file  . Transportation needs:    Medical: Not on file    Non-medical: Not on file  Tobacco Use  . Smoking status: Former Smoker    Types: Cigarettes  . Smokeless tobacco: Never Used  . Tobacco comment: "smoked when I drank"  Substance and Sexual Activity  . 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 Drug use: Never  . Sexual activity: Not Currently  Lifestyle  . Physical activity:    Days per week: Not on file    Minutes per session: Not on file  . Stress: Not on file  Relationships  . Social connections:    Talks on phone: Not on file    Gets together: Not on file    Attends religious service: Not on file    Active member of club or organization: Not on file    Attends meetings of clubs or organizations: Not on file    Relationship status: Not on file  . Intimate partner violence:    Fear of current or ex partner: Not on file    Emotionally abused: Not on file    Physically abused: Not on file    Forced sexual activity: Not on file  Other Topics Concern  . Not on file  Social History Narrative   Patient lives at home with wife and some of his 10 children. He self-administers his own medications.    Physical Exam Cardiovascular:     Rate and Rhythm: Normal rate and regular rhythm.     Pulses: Normal pulses.  Pulmonary:     Effort: Pulmonary effort is normal.  Musculoskeletal: Normal range of motion.     Right lower leg: Edema present.     Left lower leg: Edema present.  Skin:    General: Skin is warm and dry.     Capillary Refill: Capillary refill takes less than 2 seconds.  Neurological:     Mental Status: He is alert and oriented to person, place, and time.  Psychiatric:        Mood and Affect: Mood normal.         Future Appointments  Date Time Provider Department Center  10/25/2018 10:00 AM  [facility] PA/NP [facility] None  11/22/2018  2:00 PM Laurey Morale, MD [facility] None  12/02/2018  7:00 AM [facility] DEVICE REMOTES [facility]    BP 93/66 (BP Location: Left Arm, Patient Position: Sitting, Cuff Size: Normal)   Pulse 60   Resp 16   Wt 258 lb (117 kg)  SpO2 93%   BMI 29.81 kg/m   Weight yesterday- 258 lb Last visit weight- 258 lb  Mr Haselhuhn was seen at home and reported feeling well. He denied chest pain, SOB, headache, dizziness, orthopnea, cough or fever since our last visit. He stated he has been compliant with his medications and his weight is stable. He did complain of worsening neuropathy in his hands. I advised that he should speak to his neurologist about this. He did not have any Jardiance and the pharmacy stated the physician refused a refill. I advised that he reach out to his doctor about that as well. Her was understanding and agreeable. His medications were verified and his pillbox was refilled.   [name], EMT 10/09/18  ACTION: Home visit completed Next visit planned for 1 week

## 2018-10-11 ENCOUNTER — Other Ambulatory Visit (HOSPITAL_COMMUNITY): Payer: Self-pay | Admitting: Cardiology

## 2018-10-15 ENCOUNTER — Telehealth (HOSPITAL_COMMUNITY): Payer: Self-pay

## 2018-10-15 NOTE — Telephone Encounter (Signed)
I called Mr Richard [name] to schedule an appointment. He did not answer so I left a voicemail advising my purpose for calling and requested he call me back.

## 2018-10-16 ENCOUNTER — Other Ambulatory Visit (HOSPITAL_COMMUNITY): Payer: Self-pay

## 2018-10-16 ENCOUNTER — Other Ambulatory Visit (HOSPITAL_COMMUNITY): Payer: Self-pay | Admitting: *Deleted

## 2018-10-16 ENCOUNTER — Other Ambulatory Visit (HOSPITAL_COMMUNITY): Payer: Self-pay | Admitting: Cardiology

## 2018-10-16 MED ORDER — ISOSORBIDE DINITRATE 20 MG PO TABS: 20.0000 mg | ORAL_TABLET | Freq: Three times a day (TID) | ORAL | 6 refills | Status: DC

## 2018-10-16 NOTE — Progress Notes (Signed)
Paramedicine Encounter    Patient ID: Richard [name], male    DOB: 10-19-1960, 58 y.o.   MRN: [id number]   Patient Care Team: Ananias Pilgrim, MD as PCP - General (Family Medicine) Laurey Morale, MD as PCP - Advanced Heart Failure (Cardiology)  Patient Active Problem List   Diagnosis Date Noted  . Acute on chronic combined systolic and diastolic CHF (congestive heart failure) (HCC) 03/15/2018  . S/P right and left heart catheterization 11/02/2017  . Panic attacks 11/02/2017  . Non-STEMI (non-ST elevated myocardial infarction) (HCC)   . COPD (chronic obstructive pulmonary disease) (HCC)   . Hypertension   . CKD (chronic kidney disease)   . 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HCC)   . ICD (implantable cardioverter-defibrillator) in place   . GERD (gastroesophageal reflux disease)   . Diabetes mellitus with diabetic neuropathy, with long-term current use of insulin (HCC)     Current Outpatient Medications:  .  allopurinol (ZYLOPRIM) 100 MG tablet, Take 1 tablet (100 mg total) by mouth daily., Disp: 30 tablet, Rfl: 3 .  aspirin EC 81 MG EC tablet, Take 1 tablet (81 mg total) by mouth daily., Disp: 90 tablet, Rfl: 0 .  atorvastatin (LIPITOR) 40 MG tablet, TAKE 1 TABLET(40 MG) BY MOUTH DAILY AT 6 PM, Disp: 90 tablet, Rfl: 3 .  bisoprolol (ZEBETA) 5 MG tablet, Take 0.5 tablets (2.5 mg total) by mouth daily., Disp: 15 tablet, Rfl: 11 .  BREO ELLIPTA 100-25 MCG/INH AEPB, Inhale 1 puff into the lungs daily., Disp: , Rfl: 5 .  clopidogrel (PLAVIX) 75 MG tablet, TAKE 1 TABLET(75 MG) BY MOUTH DAILY WITH BREAKFAST, Disp: 30 tablet, Rfl: 1 .  digoxin (LANOXIN) 0.125 MG tablet, TAKE 1 TABLET(0.125 MG) BY MOUTH DAILY, Disp: 30 tablet, Rfl: 3 .  diphenhydrAMINE (BENADRYL) 25 MG tablet, Take 25 mg by mouth at bedtime as needed., Disp: , Rfl:  .  DULoxetine (CYMBALTA) 60 MG capsule, Take 60 mg by mouth daily., Disp: , Rfl: 2 .  eplerenone (INSPRA) 25 MG tablet, Take 1 tablet (25 mg total) by mouth daily., Disp:  30 tablet, Rfl: 6 .  hydrALAZINE (APRESOLINE) 25 MG tablet, Take 1 tablet (25 mg total) by mouth 3 (three) times daily., Disp: 270 tablet, Rfl: 3 .  Insulin Pen Needle (NOVOFINE) 30G X 8 MM MISC, Inject 10 each into the skin as needed., Disp: 100 each, Rfl: 0 .  insulin regular human CONCENTRATED (HUMULIN R U-500 KWIKPEN) 500 UNIT/ML kwikpen, USE AS DIRECTED PER SLIDING SCALE. MAX DAILY DOSE IS 400 UNITS., Disp: , Rfl:  .  MAGNESIUM-OXIDE 400 (241.3 Mg) MG tablet, Take 400 mg by mouth daily., Disp: , Rfl: 11 .  omeprazole (PRILOSEC) 20 MG capsule, Take 20 mg by mouth daily., Disp: , Rfl: 1 .  potassium chloride SA (K-DUR,KLOR-CON) 20 MEQ tablet, Take 1 tablet (20 mEq total) by mouth daily., Disp: 90 tablet, Rfl: 3 .  pregabalin (LYRICA) 100 MG capsule, Take 1 capsule (100 mg total) by mouth 2 (two) times daily., Disp: 60 capsule, Rfl: 3 .  sacubitril-valsartan (ENTRESTO) 97-103 MG, Take 1 tablet by mouth 2 (two) times daily., Disp: 60 tablet, Rfl: 6 .  torsemide (DEMADEX) 20 MG tablet, Take 1 tablet (20 mg total) by mouth 2 (two) times daily., Disp: 180 tablet, Rfl: 3 .  Vitamin D, Cholecalciferol, 1000 units CAPS, Take 1,000 Units by mouth daily., Disp: , Rfl: 11 .  empagliflozin (JARDIANCE) 10 MG TABS tablet, Take 10 mg by mouth daily. , Disp: ,  Rfl:  .  isosorbide dinitrate (ISORDIL) 20 MG tablet, Take 1 tablet (20 mg total) by mouth 3 (three) times daily., Disp: 30 tablet, Rfl: 6 .  ranitidine (ZANTAC) 150 MG tablet, Take 150 mg by mouth 2 (two) times daily., Disp: , Rfl:  Allergies  Allergen Reactions  . Codeine   . Coconut Oil Itching      Social History   Socioeconomic History  . Marital status: Divorced    Spouse name: Not on file  . Number of children: Not on file  . Years of education: Not on file  . Highest education level: Not on file  Occupational History  . Occupation: disability    Comment: stopped working in 2000 d/t occupational exposures  Social Needs  . Financial  resource strain: Not on file  . Food insecurity:    Worry: Not on file    Inability: Not on file  . Transportation needs:    Medical: Not on file    Non-medical: Not on file  Tobacco Use  . Smoking status: Former Smoker    Types: Cigarettes  . Smokeless tobacco: Never Used  . Tobacco comment: "smoked when I drank"  Substance and Sexual Activity  . 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 Drug use: Never  . Sexual activity: Not Currently  Lifestyle  . Physical activity:    Days per week: Not on file    Minutes per session: Not on file  . Stress: Not on file  Relationships  . Social connections:    Talks on phone: Not on file    Gets together: Not on file    Attends religious service: Not on file    Active member of club or organization: Not on file    Attends meetings of clubs or organizations: Not on file    Relationship status: Not on file  . Intimate partner violence:    Fear of current or ex partner: Not on file    Emotionally abused: Not on file    Physically abused: Not on file    Forced sexual activity: Not on file  Other Topics Concern  . Not on file  Social History Narrative   Patient lives at home with wife and some of his 10 children. He self-administers his own medications.    Physical Exam Cardiovascular:     Rate and Rhythm: Normal rate and regular rhythm.     Pulses: Normal pulses.  Pulmonary:     Effort: Pulmonary effort is normal.     Breath sounds: Normal breath sounds.  Musculoskeletal: Normal range of motion.     Right lower leg: Edema present.     Left lower leg: Edema present.  Skin:    General: Skin is warm and dry.     Capillary Refill: Capillary refill takes less than 2 seconds.  Neurological:     Mental Status: He is alert and oriented to person, place, and time.  Psychiatric:        Mood and Affect: Mood normal.         Future Appointments  Date Time Provider Department  Center  10/25/2018 10:00 AM [facility] PA/NP [facility] None  11/22/2018  2:00 PM Laurey MoraleMcLean, Dalton S, MD [facility] None  12/02/2018  7:00 AM [facility]ICE [facility]OTES [facility]    BP 97/68 (BP Location: Left Arm, Patient Position: Sitting, Cuff Size: Normal)   Pulse 80   Resp 16  Wt 259 lb (117.5 kg)   SpO2 94%   BMI 29.93 kg/m   Weight yesterday- 258 lb Last visit weight- 258 lb  Mr Pianka was seen at home today and reported feeling well. He denied chest pain, SOB, headache, dizziness, orthopnea, cough or fever since our last visit. He reported being compliant with his medications over the past week and his weight has been stable. His medications were verified and his pillbox was refilled. The pharmacy is contacting his physician regarding Jardiance. I will follow up next week.   [name], EMT 10/16/18  ACTION: Home visit completed Next visit planned for 1 week

## 2018-10-17 ENCOUNTER — Other Ambulatory Visit (HOSPITAL_COMMUNITY): Payer: Self-pay

## 2018-10-17 MED ORDER — SACUBITRIL-VALSARTAN 97-103 MG PO TABS: 1.0000 | ORAL_TABLET | Freq: Two times a day (BID) | ORAL | 3 refills | Status: DC

## 2018-10-17 NOTE — Telephone Encounter (Signed)
Called [facility] to cancel

## 2018-10-17 NOTE — Telephone Encounter (Signed)
Called [facility] and cancelled prescription for Entresto as patient no longer uses [facility].

## 2018-10-23 ENCOUNTER — Other Ambulatory Visit (HOSPITAL_COMMUNITY): Payer: Self-pay | Admitting: Adult Health

## 2018-10-23 ENCOUNTER — Other Ambulatory Visit (HOSPITAL_COMMUNITY): Payer: Self-pay

## 2018-10-23 MED ORDER — ISOSORBIDE DINITRATE 20 MG PO TABS: 20.0000 mg | ORAL_TABLET | Freq: Three times a day (TID) | ORAL | 10 refills | Status: DC

## 2018-10-23 NOTE — Progress Notes (Signed)
Paramedicine Encounter    Patient ID: Nechama GuardRickey Kirschbaum, male    DOB: June 01, 1961, 58 y.o.   MRN: [id number]   Patient Care Team: Ananias PilgrimAsres, Alehegn, MD as PCP - General (Family Medicine) Laurey MoraleMcLean, Dalton S, MD as PCP - Advanced Heart Failure (Cardiology)  Patient Active Problem List   Diagnosis Date Noted  . Acute on chronic combined systolic and diastolic CHF (congestive heart failure) (HCC) 03/15/2018  . S/P right and left heart catheterization 11/02/2017  . Panic attacks 11/02/2017  . Non-STEMI (non-ST elevated myocardial infarction) (HCC)   . COPD (chronic obstructive pulmonary disease) (HCC)   . Hypertension   . CKD (chronic kidney disease)   . 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HCC)   . ICD (implantable cardioverter-defibrillator) in place   . GERD (gastroesophageal reflux disease)   . Diabetes mellitus with diabetic neuropathy, with long-term current use of insulin (HCC)     Current Outpatient Medications:  .  allopurinol (ZYLOPRIM) 100 MG tablet, Take 1 tablet (100 mg total) by mouth daily., Disp: 30 tablet, Rfl: 3 .  aspirin EC 81 MG EC tablet, Take 1 tablet (81 mg total) by mouth daily., Disp: 90 tablet, Rfl: 0 .  atorvastatin (LIPITOR) 40 MG tablet, TAKE 1 TABLET(40 MG) BY MOUTH DAILY AT 6 PM, Disp: 90 tablet, Rfl: 3 .  bisoprolol (ZEBETA) 5 MG tablet, Take 0.5 tablets (2.5 mg total) by mouth daily., Disp: 15 tablet, Rfl: 11 .  BREO ELLIPTA 100-25 MCG/INH AEPB, Inhale 1 puff into the lungs daily., Disp: , Rfl: 5 .  clopidogrel (PLAVIX) 75 MG tablet, TAKE 1 TABLET(75 MG) BY MOUTH DAILY WITH BREAKFAST, Disp: 30 tablet, Rfl: 1 .  digoxin (LANOXIN) 0.125 MG tablet, TAKE 1 TABLET(0.125 MG) BY MOUTH DAILY, Disp: 30 tablet, Rfl: 3 .  diphenhydrAMINE (BENADRYL) 25 MG tablet, Take 25 mg by mouth at bedtime as needed., Disp: , Rfl:  .  DULoxetine (CYMBALTA) 60 MG capsule, Take 60 mg by mouth daily., Disp: , Rfl: 2 .  eplerenone (INSPRA) 25 MG tablet, Take 1 tablet (25 mg total) by mouth daily., Disp:  30 tablet, Rfl: 6 .  hydrALAZINE (APRESOLINE) 25 MG tablet, Take 1 tablet (25 mg total) by mouth 3 (three) times daily., Disp: 270 tablet, Rfl: 3 .  Insulin Pen Needle (NOVOFINE) 30G X 8 MM MISC, Inject 10 each into the skin as needed., Disp: 100 each, Rfl: 0 .  insulin regular human CONCENTRATED (HUMULIN R U-500 KWIKPEN) 500 UNIT/ML kwikpen, USE AS DIRECTED PER SLIDING SCALE. MAX DAILY DOSE IS 400 UNITS., Disp: , Rfl:  .  MAGNESIUM-OXIDE 400 (241.3 Mg) MG tablet, Take 400 mg by mouth daily., Disp: , Rfl: 11 .  omeprazole (PRILOSEC) 20 MG capsule, Take 20 mg by mouth daily., Disp: , Rfl: 1 .  potassium chloride SA (K-DUR,KLOR-CON) 20 MEQ tablet, Take 1 tablet (20 mEq total) by mouth daily., Disp: 90 tablet, Rfl: 3 .  sacubitril-valsartan (ENTRESTO) 97-103 MG, Take 1 tablet by mouth 2 (two) times daily., Disp: 180 tablet, Rfl: 3 .  torsemide (DEMADEX) 20 MG tablet, Take 1 tablet (20 mg total) by mouth 2 (two) times daily., Disp: 180 tablet, Rfl: 3 .  Vitamin D, Cholecalciferol, 1000 units CAPS, Take 1,000 Units by mouth daily., Disp: , Rfl: 11 .  empagliflozin (JARDIANCE) 10 MG TABS tablet, Take 10 mg by mouth daily. , Disp: , Rfl:  .  isosorbide dinitrate (ISORDIL) 20 MG tablet, Take 1 tablet (20 mg total) by mouth 3 (three) times daily., Disp: 90  tablet, Rfl: 10 .  pregabalin (LYRICA) 100 MG capsule, Take 1 capsule (100 mg total) by mouth 2 (two) times daily. (Patient not taking: Reported on 10/23/2018), Disp: 60 capsule, Rfl: 3 .  ranitidine (ZANTAC) 150 MG tablet, Take 150 mg by mouth 2 (two) times daily., Disp: , Rfl:  Allergies  Allergen Reactions  . Codeine   . Coconut Oil Itching      Social History   Socioeconomic History  . Marital status: Divorced    Spouse name: Not on file  . Number of children: Not on file  . Years of education: Not on file  . Highest education level: Not on file  Occupational History  . Occupation: disability    Comment: stopped working in 2000 d/t  occupational exposures  Social Needs  . Financial resource strain: Not on file  . Food insecurity:    Worry: Not on file    Inability: Not on file  . Transportation needs:    Medical: Not on file    Non-medical: Not on file  Tobacco Use  . Smoking status: Former Smoker    Types: Cigarettes  . Smokeless tobacco: Never Used  . Tobacco comment: "smoked when I drank"  Substance and Sexual Activity  . 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 Drug use: Never  . Sexual activity: Not Currently  Lifestyle  . Physical activity:    Days per week: Not on file    Minutes per session: Not on file  . Stress: Not on file  Relationships  . Social connections:    Talks on phone: Not on file    Gets together: Not on file    Attends religious service: Not on file    Active member of club or organization: Not on file    Attends meetings of clubs or organizations: Not on file    Relationship status: Not on file  . Intimate partner violence:    Fear of current or ex partner: Not on file    Emotionally abused: Not on file    Physically abused: Not on file    Forced sexual activity: Not on file  Other Topics Concern  . Not on file  Social History Narrative   Patient lives at home with wife and some of his 10 children. He self-administers his own medications.    Physical Exam Cardiovascular:     Rate and Rhythm: Normal rate and regular rhythm.     Pulses: Normal pulses.  Pulmonary:     Effort: Pulmonary effort is normal.     Breath sounds: Normal breath sounds.  Musculoskeletal: Normal range of motion.     Right lower leg: Edema present.     Left lower leg: Edema present.  Skin:    General: Skin is warm and dry.     Capillary Refill: Capillary refill takes less than 2 seconds.  Neurological:     Mental Status: He is alert and oriented to person, place, and time.  Psychiatric:        Mood and Affect: Mood normal.          Future Appointments  Date Time Provider Department Center  10/25/2018 10:00 AM [facility] PA/NP [facility] None  11/22/2018  2:00 PM Laurey Morale, MD [facility] None  12/02/2018  7:00 AM [facility] DEVICE REMOTES [facility]    BP 106/63 (BP Location: Left Arm, Patient Position: Sitting, Cuff Size: Normal)  Pulse 80   Resp 16   Wt 260 lb (117.9 kg)   SpO2 96%   BMI 30.05 kg/m   Weight yesterday- 258 lb Last visit weight- 259 lb  Mr Toulouse was seen at home today and reported feeling well. He denied chest pain, SOB, headache, dizziness, orthopnea, cough or fever since our last visit. He reported being compliant with his medications since last week and his weight has been stable. His medications were verified and his pillbox was refilled. He still does not have Jardiance but he said he would call his PCP to ask about getting a new prescription.   [name], EMT 10/23/18  ACTION: Home visit completed Next visit planned for 1 week

## 2018-10-24 NOTE — Progress Notes (Addendum)
Heart Failure TeleHealth Note  Due to national recommendations of social distancing due to COVID 19, telehealth visit is felt to be most appropriate for this patient at this time.  I discussed the limitations, risks, security and privacy concerns of performing an evaluation and management service by telephone and the availability of in person appointments. I also discussed with the patient that there may be a patient responsible charge related to this service. The patient expressed understanding and agreed to proceed.   ID:  Richard [name], DOB [birth year], MRN [id number]  Location: Home  Provider location: [address], [place] Type of Visit: Established patient   PCP:  Ananias Pilgrim, MD  Cardiologist:  No primary care provider on file. Primary HF: Dr Shirlee Latch  Chief Complaint: HF follow up   History of Present Illness: Richard [name] is a 58 y.o. male who presents via audio/video conferencing for a telehealth visit today.     Richard Burnsis a 58 y.o.malewith a history of chronic systolic HF s/p Medtronic ICD (12/2016), HTN, DM, left hamartoma,and HLD.   RHC/LHC (5/19): 80% distal LAD stenosis, 80% proximal RCA stenosis treated with DES to pRCA  Echo 07/30/18 EF 20-25%. RV moderately reduced.   Patient presents via audio conferencing for a telehealth visit today. Last virtual visit, torsemide was decreased and he was started on bisoprolol. Overall doing fine. No SOB. Exercising every other day. No orthopnea or PND. Mild BLE edema. No dizziness or CP. Appetite and energy level okay. Weight stable 259-260 lbs. Followed by HF paramedicine. Vitals taken by paramedic, Richard [name], as below. Initial radial pulse thought to be in 40s, but improved to 80s on second check. EKG shows NSR 88 bpm with no PVCs. HR 60-80 on paramedic visits. Taking all medication as prescribed. He has been out of jardiance. He is on [employer] program and is limiting fluid and salt.   BP: 125/87 HR: 88 O2:  95% Weight 260 lb  Pt denies symptoms of cough, fevers, chills, or new SOB worrisome for COVID 19.    Past Medical History:  Diagnosis Date  . AICD (automatic cardioverter/defibrillator) present 01/11/2017  . Anxiety   . CKD (chronic kidney disease)   . COPD (chronic obstructive pulmonary disease) (HCC)    never smoker, industrial exposure  . Diabetes mellitus with diabetic neuropathy, with long-term current use of insulin (HCC)   . GERD (gastroesophageal reflux disease)   . High cholesterol   . History of gout    "take RX qd" (11/01/2017)  . Hypertension   . Nonobstructive atherosclerosis of coronary artery   . On home oxygen therapy    "2L prn" (11/01/2017)  . 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HCC)    LLL, present for years  . Systolic HF (heart failure) [facility])    Past Surgical History:  Procedure Laterality Date  . CATARACT EXTRACTION W/ INTRAOCULAR LENS IMPLANTW/ TRABECULECTOMY Left ~ 2013   "may have done this when I had my other eye OR"  . CIRCUMCISION    . CORONARY STENT INTERVENTION N/A 11/02/2017   Procedure: CORONARY STENT INTERVENTION;  Surgeon: Laurey Morale, MD;  [facility]: [facility] INVASIVE CV LAB;  Service: Cardiovascular;  Laterality: N/A;  . CORONARY STENT INTERVENTION N/A 11/02/2017   Procedure: CORONARY STENT INTERVENTION;  Surgeon: Lyn Records, MD;  [facility] INV[facility];  Service: Cardiovascular;  Laterality: N/A;  . EYE SURGERY Left ~ 2013   "fell on something in basement; stuck in my eye"  .  ICD IMPLANT     Medtronic Dual Chamber 01/11/17  . MULTIPLE TOOTH EXTRACTIONS     "pulled all my top teeth"  . RIGHT/LEFT HEART CATH AND CORONARY ANGIOGRAPHY N/A 11/02/2017   Procedure: RIGHT/LEFT HEART CATH AND CORONARY ANGIOGRAPHY;  Surgeon: Laurey Morale, MD;  Location: [facility] INVASIVE CV LAB;  Service: Cardiovascular;  Laterality: N/A;     Current Outpatient Medications  Medication Sig Dispense Refill  . allopurinol (ZYLOPRIM) 100 MG tablet Take 1 tablet (100 mg  total) by mouth daily. 30 tablet 3  . aspirin EC 81 MG EC tablet Take 1 tablet (81 mg total) by mouth daily. 90 tablet 0  . atorvastatin (LIPITOR) 40 MG tablet TAKE 1 TABLET(40 MG) BY MOUTH DAILY AT 6 PM 90 tablet 3  . bisoprolol (ZEBETA) 5 MG tablet Take 0.5 tablets (2.5 mg total) by mouth daily. 15 tablet 11  . BREO ELLIPTA 100-25 MCG/INH AEPB Inhale 1 puff into the lungs daily.  5  . clopidogrel (PLAVIX) 75 MG tablet TAKE 1 TABLET(75 MG) BY MOUTH DAILY WITH BREAKFAST 30 tablet 1  . digoxin (LANOXIN) 0.125 MG tablet TAKE 1 TABLET(0.125 MG) BY MOUTH DAILY 30 tablet 3  . diphenhydrAMINE (BENADRYL) 25 MG tablet Take 25 mg by mouth at bedtime as needed.    . DULoxetine (CYMBALTA) 60 MG capsule Take 60 mg by mouth daily.  2  . empagliflozin (JARDIANCE) 10 MG TABS tablet Take 10 mg by mouth daily.     Marland Kitchen eplerenone (INSPRA) 25 MG tablet Take 1 tablet (25 mg total) by mouth daily. 30 tablet 6  . hydrALAZINE (APRESOLINE) 25 MG tablet Take 1 tablet (25 mg total) by mouth 3 (three) times daily. 270 tablet 3  . Insulin Pen Needle (NOVOFINE) 30G X 8 MM MISC Inject 10 each into the skin as needed. 100 each 0  . insulin regular human CONCENTRATED (HUMULIN R U-500 KWIKPEN) 500 UNIT/ML kwikpen USE AS DIRECTED PER SLIDING SCALE. MAX DAILY DOSE IS 400 UNITS.    Marland Kitchen isosorbide dinitrate (ISORDIL) 20 MG tablet Take 1 tablet (20 mg total) by mouth 3 (three) times daily. 90 tablet 10  . MAGNESIUM-OXIDE 400 (241.3 Mg) MG tablet Take 400 mg by mouth daily.  11  . omeprazole (PRILOSEC) 20 MG capsule Take 20 mg by mouth daily.  1  . potassium chloride SA (K-DUR,KLOR-CON) 20 MEQ tablet Take 1 tablet (20 mEq total) by mouth daily. 90 tablet 3  . pregabalin (LYRICA) 100 MG capsule Take 1 capsule (100 mg total) by mouth 2 (two) times daily. (Patient not taking: Reported on 10/23/2018) 60 capsule 3  . ranitidine (ZANTAC) 150 MG tablet Take 150 mg by mouth 2 (two) times daily.    . sacubitril-valsartan (ENTRESTO) 97-103 MG Take 1  tablet by mouth 2 (two) times daily. 180 tablet 3  . torsemide (DEMADEX) 20 MG tablet Take 1 tablet (20 mg total) by mouth 2 (two) times daily. 180 tablet 3  . Vitamin D, Cholecalciferol, 1000 units CAPS Take 1,000 Units by mouth daily.  11   No current facility-administered medications for this visit.     Allergies:   Codeine and Coconut oil   Social History:  The patient  reports that he has quit smoking. His smoking use included cigarettes. He has never used smokeless tobacco. He reports previous alcohol use. He reports that he does not use drugs.   Family History:  The patient's family history includes Diabetes in his brother, father, mother, and sister; Hypertension in  his father and mother.   ROS:  Please see the history of present illness.   All other systems are personally reviewed and negative.    Exam:  (Video/Tele Health Call; Exam is subjective and or/visual.) General:  Speaks in full sentences. No resp difficulty. Lungs: Normal respiratory effort with conversation.  Abdomen: Mild distension per patient report Extremities: BLE 1+ edema. Neuro: Alert & oriented x 3.   Recent Labs: 07/30/2018: ALT 33; Hemoglobin 17.7; Platelets 280 08/20/2018: B Natriuretic Peptide 128.4; BUN 18; Creatinine, Ser 1.03; Potassium 4.1; Sodium 140  Personally reviewed   Wt Readings from Last 3 Encounters:  10/23/18 117.9 kg (260 lb)  10/16/18 117.5 kg (259 lb)  10/09/18 117 kg (258 lb)      ASSESSMENT AND PLAN:  1.  Chronic Systolic Heart Failure  Primarily nonischemic cardiomyopathy. Echo in 2017 with EF 15-20%. Medtronic ICD. Echo 10/2017 EF 15-20%, severely dilated RV with severely decreased systolic function. LHC/RHC 11/06/17 showed volume overload with 80% RCA stenosis.  NYHA II. Volume status mildly up. Take additional 20 mg torsemide today and tomorrow. - Continue torsemide to 20 mg twice a day. Check BMET next week -Continue bisoprolol 2.5 mg daily.  -Continue digoxin 0.125 mg  daily. Check dig level next week -Continue entresto 97-103 twice a day.  -Continue inspra 25 mg daily.  - Continue hydral 25 mg TID + isordil 20 mg TID - He has been out of jardiance. I will try to get in touch with his PCP to follow up on refills.   2. CAD  LHC in 5/19 with 80-90% proximal RCA stenosis treated with DES to RCA. This did not cause his cardiomyopathy but is a large RCA. -No s/s ischemia.  -Continue asa, atorvastatin, and plavix  -Can stop plavix Nov 03 2018.    COVID screen The patient does not have any symptoms that suggest any further testing/ screening at this time.  Social distancing reinforced today.  Patient Risk: After full review of this patients clinical status, I feel that they are at moderate risk for cardiac decompensation at this time.  Orders/Follow up: Can cancel f/u in May. Needs f/u in 2 months with echo. Check BMET and dig level next week on Friday. Stop plavix after May 10. Will follow up with his PCP about jardiance Rx.   Today, I have spent 15 minutes with the patient with telehealth technology discussing the above issues   Signed, Alford HighlandAshley M , NP  10/24/2018 1:48 PM   [facility] [address] [facility] [place] [address] [phone] (office) [phone] (fax)

## 2018-10-25 ENCOUNTER — Other Ambulatory Visit: Payer: Self-pay

## 2018-10-25 ENCOUNTER — Other Ambulatory Visit (HOSPITAL_COMMUNITY): Payer: Self-pay

## 2018-10-25 ENCOUNTER — Ambulatory Visit (HOSPITAL_COMMUNITY)
Admission: RE | Admit: 2018-10-25 | Discharge: 2018-10-25 | Disposition: A | Discharge status: Home / Self Care | Payer: Medicare Other | Source: Ambulatory Visit | Attending: Internal Medicine | Admitting: Internal Medicine

## 2018-10-25 DIAGNOSIS — Z794 Long term (current) use of insulin: Secondary | ICD-10-CM

## 2018-10-25 DIAGNOSIS — I5022 Chronic systolic (congestive) heart failure: Secondary | ICD-10-CM | POA: Diagnosis not present

## 2018-10-25 DIAGNOSIS — I2510 Atherosclerotic heart disease of native coronary artery without angina pectoris: Secondary | ICD-10-CM

## 2018-10-25 DIAGNOSIS — E119 Type 2 diabetes mellitus without complications: Secondary | ICD-10-CM

## 2018-10-25 NOTE — Progress Notes (Signed)
Spoke to pt and his wife. His wife was unable to bring him to an appointment next Friday. Made the appointment at the closest date to original request. Reviewed after visit summary to both pt and his wife. They verbalized understanding and are agreeable. No further questions at this time.

## 2018-10-25 NOTE — Patient Instructions (Addendum)
Lab work will need to be done next Friday. This is scheduled for Tuesday May 12th at 11:00am  STOP Plavix after May 10th.  Your physician has requested that you have an echocardiogram. Echocardiography is a painless test that uses sound waves to create images of your heart. It provides your doctor with information about the size and shape of your heart and how well your heart's chambers and valves are working. This procedure takes approximately one hour. There are no restrictions for this procedure. This will be done at your follow up with the Advanced Practice Provider in 2 months.  Please follow up with the Advanced Practice Provider in 2 months with an echocardiogram.

## 2018-10-25 NOTE — Progress Notes (Signed)
Paramedicine Encounter    Patient ID: Richard [name], male    DOB: [birth year], 58 y.o.   MRN: [id number]   Patient Care Team: Ananias Pilgrim, MD as PCP - General (Family Medicine) Laurey Morale, MD as PCP - Advanced Heart Failure (Cardiology)  Patient Active Problem List   Diagnosis Date Noted  . Acute on chronic combined systolic and diastolic CHF (congestive heart failure) (HCC) 03/15/2018  . S/P right and left heart catheterization 11/02/2017  . Panic attacks 11/02/2017  . Non-STEMI (non-ST elevated myocardial infarction) (HCC)   . COPD (chronic obstructive pulmonary disease) (HCC)   . Hypertension   . CKD (chronic kidney disease)   . 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HCC)   . ICD (implantable cardioverter-defibrillator) in place   . GERD (gastroesophageal reflux disease)   . Diabetes mellitus with diabetic neuropathy, with long-term current use of insulin (HCC)     Current Outpatient Medications:  .  allopurinol (ZYLOPRIM) 100 MG tablet, Take 1 tablet (100 mg total) by mouth daily., Disp: 30 tablet, Rfl: 3 .  aspirin EC 81 MG EC tablet, Take 1 tablet (81 mg total) by mouth daily., Disp: 90 tablet, Rfl: 0 .  atorvastatin (LIPITOR) 40 MG tablet, TAKE 1 TABLET(40 MG) BY MOUTH DAILY AT 6 PM, Disp: 90 tablet, Rfl: 3 .  bisoprolol (ZEBETA) 5 MG tablet, Take 0.5 tablets (2.5 mg total) by mouth daily., Disp: 15 tablet, Rfl: 11 .  BREO ELLIPTA 100-25 MCG/INH AEPB, Inhale 1 puff into the lungs daily., Disp: , Rfl: 5 .  digoxin (LANOXIN) 0.125 MG tablet, TAKE 1 TABLET(0.125 MG) BY MOUTH DAILY, Disp: 30 tablet, Rfl: 3 .  diphenhydrAMINE (BENADRYL) 25 MG tablet, Take 25 mg by mouth at bedtime as needed., Disp: , Rfl:  .  DULoxetine (CYMBALTA) 60 MG capsule, Take 60 mg by mouth daily., Disp: , Rfl: 2 .  empagliflozin (JARDIANCE) 10 MG TABS tablet, Take 10 mg by mouth daily. , Disp: , Rfl:  .  eplerenone (INSPRA) 25 MG tablet, Take 1 tablet (25 mg total) by mouth daily., Disp: 30 tablet, Rfl: 6 .   hydrALAZINE (APRESOLINE) 25 MG tablet, Take 1 tablet (25 mg total) by mouth 3 (three) times daily., Disp: 270 tablet, Rfl: 3 .  Insulin Pen Needle (NOVOFINE) 30G X 8 MM MISC, Inject 10 each into the skin as needed., Disp: 100 each, Rfl: 0 .  insulin regular human CONCENTRATED (HUMULIN R U-500 KWIKPEN) 500 UNIT/ML kwikpen, USE AS DIRECTED PER SLIDING SCALE. MAX DAILY DOSE IS 400 UNITS., Disp: , Rfl:  .  isosorbide dinitrate (ISORDIL) 20 MG tablet, Take 1 tablet (20 mg total) by mouth 3 (three) times daily., Disp: 90 tablet, Rfl: 10 .  MAGNESIUM-OXIDE 400 (241.3 Mg) MG tablet, Take 400 mg by mouth daily., Disp: , Rfl: 11 .  omeprazole (PRILOSEC) 20 MG capsule, Take 20 mg by mouth daily., Disp: , Rfl: 1 .  potassium chloride SA (K-DUR,KLOR-CON) 20 MEQ tablet, Take 1 tablet (20 mEq total) by mouth daily., Disp: 90 tablet, Rfl: 3 .  pregabalin (LYRICA) 100 MG capsule, Take 1 capsule (100 mg total) by mouth 2 (two) times daily. (Patient not taking: Reported on 10/23/2018), Disp: 60 capsule, Rfl: 3 .  ranitidine (ZANTAC) 150 MG tablet, Take 150 mg by mouth 2 (two) times daily., Disp: , Rfl:  .  sacubitril-valsartan (ENTRESTO) 97-103 MG, Take 1 tablet by mouth 2 (two) times daily., Disp: 180 tablet, Rfl: 3 .  torsemide (DEMADEX) 20 MG tablet, Take 1 tablet (  20 mg total) by mouth 2 (two) times daily., Disp: 180 tablet, Rfl: 3 .  Vitamin D, Cholecalciferol, 1000 units CAPS, Take 1,000 Units by mouth daily., Disp: , Rfl: 11 Allergies  Allergen Reactions  . Codeine   . Coconut Oil Itching      Social History   Socioeconomic History  . Marital status: Divorced    Spouse name: Not on file  . Number of children: Not on file  . Years of education: Not on file  . Highest education level: Not on file  Occupational History  . Occupation: disability    Comment: stopped working in 2000 d/t occupational exposures  Social Needs  . Financial resource strain: Not on file  . Food insecurity:    Worry: Not on  file    Inability: Not on file  . Transportation needs:    Medical: Not on file    Non-medical: Not on file  Tobacco Use  . Smoking status: Former Smoker    Types: Cigarettes  . Smokeless tobacco: Never Used  . Tobacco comment: "smoked when I drank"  Substance and Sexual Activity  . 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 Drug use: Never  . Sexual activity: Not Currently  Lifestyle  . Physical activity:    Days per week: Not on file    Minutes per session: Not on file  . Stress: Not on file  Relationships  . Social connections:    Talks on phone: Not on file    Gets together: Not on file    Attends religious service: Not on file    Active member of club or organization: Not on file    Attends meetings of clubs or organizations: Not on file    Relationship status: Not on file  . Intimate partner violence:    Fear of current or ex partner: Not on file    Emotionally abused: Not on file    Physically abused: Not on file    Forced sexual activity: Not on file  Other Topics Concern  . Not on file  Social History Narrative   Patient lives at home with wife and some of his 10 children. He self-administers his own medications.    Physical Exam      Future Appointments  Date Time Provider Department Center  11/05/2018 11:00 AM [facility] LAB [facility] None  12/02/2018  7:00 AM [facility] DEVICE REMOTES [facility]  12/26/2018  9:00 AM [facility] ECHO 1-BU[facility]  12/26/2018 10:00 AM [facility] PA/NP [facility] None    BP 125/87 (BP Location: Left Arm, Patient Position: Sitting, Cuff Size: Normal)   Pulse 88   Resp 18   Wt 260 lb (117.9 kg)   SpO2 95%   BMI 30.05 kg/m    Mr Veronica was see at home today to facilitate a virtual visit with [name], NP. He reported feeling generally well, denying chest pain, SOB, headache, dizziness, orthopnea, cough or fever. He did exhibit BLEE and a distended abdomen. Per  [name], no medication changes are necessary at this time but she advised that he could come off Plavix on the 10th of this month. I will follow up next week.   [name], EMT 10/25/18  ACTION: Home visit completed Next visit planned for 1 week

## 2018-10-25 NOTE — Addendum Note (Signed)
Encounter addended by: Nicole Cella, RN on: 10/25/2018 10:56 AM  Actions taken: Order list changed, Diagnosis association updated, Clinical Note Signed

## 2018-10-30 ENCOUNTER — Telehealth (HOSPITAL_COMMUNITY): Payer: Self-pay | Admitting: Licensed Clinical Social Worker

## 2018-10-30 ENCOUNTER — Other Ambulatory Visit (HOSPITAL_COMMUNITY): Payer: Self-pay

## 2018-10-30 NOTE — Telephone Encounter (Signed)
CSW reached out to pt to check in regarding food and medication status at this time. Pt reports he has everything he needs at this time.  Pt receiving food through the [employer] program and has been enjoying it.  States he is eating the 2 meals a day and this has helped him lose weight.  CSW encouraged pt to reach out with any concerns and will continue to follow and assist as needed  Burna Sis, LCSW Clinical Social Worker [facility] [phone]

## 2018-10-30 NOTE — Progress Notes (Signed)
Paramedicine Encounter    Patient ID: Richard [name], male    DOB: 02-11-1961, 58 y.o.   MRN: [id number]   Patient Care Team: Ananias PilgrimAsres, Alehegn, MD as PCP - General (Family Medicine) Laurey MoraleMcLean, Dalton S, MD as PCP - Advanced Heart Failure (Cardiology)  Patient Active Problem List   Diagnosis Date Noted  . Acute on chronic combined systolic and diastolic CHF (congestive heart failure) (HCC) 03/15/2018  . S/P right and left heart catheterization 11/02/2017  . Panic attacks 11/02/2017  . Non-STEMI (non-ST elevated myocardial infarction) (HCC)   . COPD (chronic obstructive pulmonary disease) (HCC)   . Hypertension   . CKD (chronic kidney disease)   . 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HCC)   . ICD (implantable cardioverter-defibrillator) in place   . GERD (gastroesophageal reflux disease)   . Diabetes mellitus with diabetic neuropathy, with long-term current use of insulin (HCC)     Current Outpatient Medications:  .  allopurinol (ZYLOPRIM) 100 MG tablet, Take 1 tablet (100 mg total) by mouth daily., Disp: 30 tablet, Rfl: 3 .  aspirin EC 81 MG EC tablet, Take 1 tablet (81 mg total) by mouth daily., Disp: 90 tablet, Rfl: 0 .  atorvastatin (LIPITOR) 40 MG tablet, TAKE 1 TABLET(40 MG) BY MOUTH DAILY AT 6 PM, Disp: 90 tablet, Rfl: 3 .  bisoprolol (ZEBETA) 5 MG tablet, Take 0.5 tablets (2.5 mg total) by mouth daily., Disp: 15 tablet, Rfl: 11 .  BREO ELLIPTA 100-25 MCG/INH AEPB, Inhale 1 puff into the lungs daily., Disp: , Rfl: 5 .  digoxin (LANOXIN) 0.125 MG tablet, TAKE 1 TABLET(0.125 MG) BY MOUTH DAILY, Disp: 30 tablet, Rfl: 3 .  diphenhydrAMINE (BENADRYL) 25 MG tablet, Take 25 mg by mouth at bedtime as needed., Disp: , Rfl:  .  DULoxetine (CYMBALTA) 60 MG capsule, Take 60 mg by mouth daily., Disp: , Rfl: 2 .  eplerenone (INSPRA) 25 MG tablet, Take 1 tablet (25 mg total) by mouth daily., Disp: 30 tablet, Rfl: 6 .  hydrALAZINE (APRESOLINE) 25 MG tablet, Take 1 tablet (25 mg total) by mouth 3 (three) times  daily., Disp: 270 tablet, Rfl: 3 .  Insulin Pen Needle (NOVOFINE) 30G X 8 MM MISC, Inject 10 each into the skin as needed., Disp: 100 each, Rfl: 0 .  insulin regular human CONCENTRATED (HUMULIN R U-500 KWIKPEN) 500 UNIT/ML kwikpen, USE AS DIRECTED PER SLIDING SCALE. MAX DAILY DOSE IS 400 UNITS., Disp: , Rfl:  .  isosorbide dinitrate (ISORDIL) 20 MG tablet, Take 1 tablet (20 mg total) by mouth 3 (three) times daily., Disp: 90 tablet, Rfl: 10 .  MAGNESIUM-OXIDE 400 (241.3 Mg) MG tablet, Take 400 mg by mouth daily., Disp: , Rfl: 11 .  omeprazole (PRILOSEC) 20 MG capsule, Take 20 mg by mouth daily., Disp: , Rfl: 1 .  potassium chloride SA (K-DUR,KLOR-CON) 20 MEQ tablet, Take 1 tablet (20 mEq total) by mouth daily., Disp: 90 tablet, Rfl: 3 .  pregabalin (LYRICA) 100 MG capsule, Take 1 capsule (100 mg total) by mouth 2 (two) times daily., Disp: 60 capsule, Rfl: 3 .  sacubitril-valsartan (ENTRESTO) 97-103 MG, Take 1 tablet by mouth 2 (two) times daily., Disp: 180 tablet, Rfl: 3 .  torsemide (DEMADEX) 20 MG tablet, Take 1 tablet (20 mg total) by mouth 2 (two) times daily., Disp: 180 tablet, Rfl: 3 .  Vitamin D, Cholecalciferol, 1000 units CAPS, Take 1,000 Units by mouth daily., Disp: , Rfl: 11 .  empagliflozin (JARDIANCE) 10 MG TABS tablet, Take 10 mg by mouth  daily. , Disp: , Rfl:  .  ranitidine (ZANTAC) 150 MG tablet, Take 150 mg by mouth 2 (two) times daily., Disp: , Rfl:  Allergies  Allergen Reactions  . Codeine   . Coconut Oil Itching      Social History   Socioeconomic History  . Marital status: Divorced    Spouse name: Not on file  . Number of children: Not on file  . Years of education: Not on file  . Highest education level: Not on file  Occupational History  . Occupation: disability    Comment: stopped working in 2000 d/t occupational exposures  Social Needs  . Financial resource strain: Not on file  . Food insecurity:    Worry: Not on file    Inability: Not on file  .  Transportation needs:    Medical: Not on file    Non-medical: Not on file  Tobacco Use  . Smoking status: Former Smoker    Types: Cigarettes  . Smokeless tobacco: Never Used  . Tobacco comment: "smoked when I drank"  Substance and Sexual Activity  . 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 Drug use: Never  . Sexual activity: Not Currently  Lifestyle  . Physical activity:    Days per week: Not on file    Minutes per session: Not on file  . Stress: Not on file  Relationships  . Social connections:    Talks on phone: Not on file    Gets together: Not on file    Attends religious service: Not on file    Active member of club or organization: Not on file    Attends meetings of clubs or organizations: Not on file    Relationship status: Not on file  . Intimate partner violence:    Fear of current or ex partner: Not on file    Emotionally abused: Not on file    Physically abused: Not on file    Forced sexual activity: Not on file  Other Topics Concern  . Not on file  Social History Narrative   Patient lives at home with wife and some of his 10 children. He self-administers his own medications.    Physical Exam Cardiovascular:     Rate and Rhythm: Normal rate and regular rhythm.     Pulses: Normal pulses.  Pulmonary:     Effort: Pulmonary effort is normal.  Musculoskeletal: Normal range of motion.     Right lower leg: Edema present.     Left lower leg: Edema present.  Skin:    General: Skin is warm and dry.     Capillary Refill: Capillary refill takes less than 2 seconds.  Neurological:     Mental Status: He is alert and oriented to person, place, and time.  Psychiatric:        Mood and Affect: Mood normal.         Future Appointments  Date Time Provider Department Center  11/05/2018 11:00 AM [facility] LAB [facility] None  12/02/2018  7:00 AM [facility] DEVICE REMOTES [facility]  12/26/2018  9:00 AM [facility]  ECHO 1-BUZZ [facility]  12/26/2018 10:00 AM [facility] PA/NP [facility] None    BP 103/64 (BP Location: Left Arm, Patient Position: Sitting, Cuff Size: Normal)   Pulse 78   Resp 16   Wt 260 lb (117.9 kg)   SpO2 93%   BMI 30.05 kg/m   Weight yesterday- 262 lb Last  visit weight- 260 lb   Mr Afshari was seen at home today and reported feeling well. He denied chest pain, SOB, headache, dizziness, orthopnea, cough or fever since our last visit. He reported having episodes of anxiety while he was out shopping with his wife yesterday and then again today while buying bait to go fishing. He stated he was concerned about his mental wellbeing and wanted to know what he should do. I advised that if this continues, he should reach out to his PCP or seek the advice of a mental health professional. He stated he would call his PCP to see if he should schedule a visit. He is still not able to get a prescription of Jardiance despite his wife calling the office multiple times over the past two weeks. I was able to see his insulin to determine his endocrinologist and found it was Dr Izell [place], with [facility]. I will pass this information along to Rosetta Posner, LCSW to see if she is able to reach him for a new prescription. His medications were verified and his pillbox was refilled. I will follow up next week.   [name], EMT 10/30/18  ACTION: Home visit completed Next visit planned for 1 week

## 2018-10-31 ENCOUNTER — Telehealth (HOSPITAL_COMMUNITY): Payer: Self-pay | Admitting: Licensed Clinical Social Worker

## 2018-10-31 NOTE — Telephone Encounter (Signed)
Pt stating that he has not had his jardiance in some time and is unable to get MD to prescribe more after call their office.  CSW called Dr. Eliane Decree office through [facility] and requested they follow up with patient regarding jardiance and current administration instructions  CSW will continue to follow and assist as needed  Burna Sis, LCSW Clinical Social Worker [facility] Desk#: [phone] Cell#: [phone]

## 2018-11-01 ENCOUNTER — Encounter (HOSPITAL_COMMUNITY): Payer: Medicare Other

## 2018-11-05 ENCOUNTER — Other Ambulatory Visit: Payer: Self-pay

## 2018-11-05 ENCOUNTER — Ambulatory Visit (HOSPITAL_COMMUNITY)
Admission: RE | Admit: 2018-11-05 | Discharge: 2018-11-05 | Disposition: A | Discharge status: Home / Self Care | Payer: Medicare Other | Source: Ambulatory Visit | Attending: Cardiology | Admitting: Cardiology

## 2018-11-05 DIAGNOSIS — I5022 Chronic systolic (congestive) heart failure: Secondary | ICD-10-CM | POA: Diagnosis not present

## 2018-11-05 LAB — BASIC METABOLIC PANEL
Anion gap: 7 (ref 5–15)
BUN: 20 mg/dL (ref 6–20)
CO2: 35 mmol/L — ABNORMAL HIGH (ref 22–32)
Calcium: 9.2 mg/dL (ref 8.9–10.3)
Chloride: 96 mmol/L — ABNORMAL LOW (ref 98–111)
Creatinine, Ser: 1.33 mg/dL — ABNORMAL HIGH (ref 0.61–1.24)
GFR calc Af Amer: >60 mL/min (ref >60)
GFR calc non Af Amer: 59 mL/min — ABNORMAL LOW (ref >60)
Glucose, Bld: 339 mg/dL — ABNORMAL HIGH (ref 70–99)
Potassium: 4 mmol/L (ref 3.5–5.1)
Sodium: 138 mmol/L (ref 135–145)

## 2018-11-05 LAB — DIGOXIN LEVEL: Digoxin Level: 0.5 ng/mL — ABNORMAL LOW (ref 0.8–2.0)

## 2018-11-06 ENCOUNTER — Telehealth (HOSPITAL_COMMUNITY): Payer: Self-pay

## 2018-11-06 NOTE — Telephone Encounter (Signed)
I called Richard [name] to schedule an appointment. He stated he was still in bed and asked that I come by this afternoon sometime. I advised that I would let him know when I was on the way. He was understanding and agreeable

## 2018-11-06 NOTE — Telephone Encounter (Signed)
I called Richard [name] from his house after arriving to find nobody was home. He has previously told me that he would be home all afternoon and I could "come whenever." He did not answer so I left him a message asking that he call me back as soon as possible.

## 2018-11-12 ENCOUNTER — Telehealth (HOSPITAL_COMMUNITY): Payer: Self-pay

## 2018-11-12 NOTE — Telephone Encounter (Signed)
I called Richard [name] to schedule an appointment. He stated he did not have anything planned for tomorrow so anytime would work for him. We agreed to meet at 14:30.

## 2018-11-13 ENCOUNTER — Other Ambulatory Visit (HOSPITAL_COMMUNITY): Payer: Self-pay

## 2018-11-13 NOTE — Progress Notes (Signed)
Paramedicine Encounter    Patient ID: Richard [name], male    DOB: [birth year], 58 y.o.   MRN: [id number]   Patient Care Team: Ananias Pilgrim, MD as PCP - General (Family Medicine) Laurey Morale, MD as PCP - Advanced Heart Failure (Cardiology)  Patient Active Problem List   Diagnosis Date Noted  . Acute on chronic combined systolic and diastolic CHF (congestive heart failure) (HCC) 03/15/2018  . S/P right and left heart catheterization 11/02/2017  . Panic attacks 11/02/2017  . Non-STEMI (non-ST elevated myocardial infarction) (HCC)   . COPD (chronic obstructive pulmonary disease) (HCC)   . Hypertension   . CKD (chronic kidney disease)   . 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HCC)   . ICD (implantable cardioverter-defibrillator) in place   . GERD (gastroesophageal reflux disease)   . Diabetes mellitus with diabetic neuropathy, with long-term current use of insulin (HCC)     Current Outpatient Medications:  .  allopurinol (ZYLOPRIM) 100 MG tablet, Take 1 tablet (100 mg total) by mouth daily., Disp: 30 tablet, Rfl: 3 .  aspirin EC 81 MG EC tablet, Take 1 tablet (81 mg total) by mouth daily., Disp: 90 tablet, Rfl: 0 .  atorvastatin (LIPITOR) 40 MG tablet, TAKE 1 TABLET(40 MG) BY MOUTH DAILY AT 6 PM, Disp: 90 tablet, Rfl: 3 .  bisoprolol (ZEBETA) 5 MG tablet, Take 0.5 tablets (2.5 mg total) by mouth daily., Disp: 15 tablet, Rfl: 11 .  BREO ELLIPTA 100-25 MCG/INH AEPB, Inhale 1 puff into the lungs daily., Disp: , Rfl: 5 .  digoxin (LANOXIN) 0.125 MG tablet, TAKE 1 TABLET(0.125 MG) BY MOUTH DAILY, Disp: 30 tablet, Rfl: 3 .  diphenhydrAMINE (BENADRYL) 25 MG tablet, Take 25 mg by mouth at bedtime as needed., Disp: , Rfl:  .  DULoxetine (CYMBALTA) 60 MG capsule, Take 60 mg by mouth daily., Disp: , Rfl: 2 .  empagliflozin (JARDIANCE) 10 MG TABS tablet, Take 10 mg by mouth daily. , Disp: , Rfl:  .  eplerenone (INSPRA) 25 MG tablet, Take 1 tablet (25 mg total) by mouth daily., Disp: 30 tablet, Rfl: 6 .   hydrALAZINE (APRESOLINE) 25 MG tablet, Take 1 tablet (25 mg total) by mouth 3 (three) times daily., Disp: 270 tablet, Rfl: 3 .  Insulin Pen Needle (NOVOFINE) 30G X 8 MM MISC, Inject 10 each into the skin as needed., Disp: 100 each, Rfl: 0 .  insulin regular human CONCENTRATED (HUMULIN R U-500 KWIKPEN) 500 UNIT/ML kwikpen, USE AS DIRECTED PER SLIDING SCALE. MAX DAILY DOSE IS 400 UNITS., Disp: , Rfl:  .  isosorbide dinitrate (ISORDIL) 20 MG tablet, Take 1 tablet (20 mg total) by mouth 3 (three) times daily., Disp: 90 tablet, Rfl: 10 .  omeprazole (PRILOSEC) 20 MG capsule, Take 20 mg by mouth daily., Disp: , Rfl: 1 .  potassium chloride SA (K-DUR,KLOR-CON) 20 MEQ tablet, Take 1 tablet (20 mEq total) by mouth daily., Disp: 90 tablet, Rfl: 3 .  pregabalin (LYRICA) 100 MG capsule, Take 1 capsule (100 mg total) by mouth 2 (two) times daily., Disp: 60 capsule, Rfl: 3 .  sacubitril-valsartan (ENTRESTO) 97-103 MG, Take 1 tablet by mouth 2 (two) times daily., Disp: 180 tablet, Rfl: 3 .  torsemide (DEMADEX) 20 MG tablet, Take 1 tablet (20 mg total) by mouth 2 (two) times daily., Disp: 180 tablet, Rfl: 3 .  Vitamin D, Cholecalciferol, 1000 units CAPS, Take 1,000 Units by mouth daily., Disp: , Rfl: 11 .  MAGNESIUM-OXIDE 400 (241.3 Mg) MG tablet, Take 400 mg by  mouth daily., Disp: , Rfl: 11 .  ranitidine (ZANTAC) 150 MG tablet, Take 150 mg by mouth 2 (two) times daily., Disp: , Rfl:  Allergies  Allergen Reactions  . Codeine   . Coconut Oil Itching      Social History   Socioeconomic History  . Marital status: Divorced    Spouse name: Not on file  . Number of children: Not on file  . Years of education: Not on file  . Highest education level: Not on file  Occupational History  . Occupation: disability    Comment: stopped working in 2000 d/t occupational exposures  Social Needs  . Financial resource strain: Not on file  . Food insecurity:    Worry: Not on file    Inability: Not on file  .  Transportation needs:    Medical: Not on file    Non-medical: Not on file  Tobacco Use  . Smoking status: Former Smoker    Types: Cigarettes  . Smokeless tobacco: Never Used  . Tobacco comment: "smoked when I drank"  Substance and Sexual Activity  . 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 Drug use: Never  . Sexual activity: Not Currently  Lifestyle  . Physical activity:    Days per week: Not on file    Minutes per session: Not on file  . Stress: Not on file  Relationships  . Social connections:    Talks on phone: Not on file    Gets together: Not on file    Attends religious service: Not on file    Active member of club or organization: Not on file    Attends meetings of clubs or organizations: Not on file    Relationship status: Not on file  . Intimate partner violence:    Fear of current or ex partner: Not on file    Emotionally abused: Not on file    Physically abused: Not on file    Forced sexual activity: Not on file  Other Topics Concern  . Not on file  Social History Narrative   Patient lives at home with wife and some of his 10 children. He self-administers his own medications.    Physical Exam Cardiovascular:     Rate and Rhythm: Normal rate and regular rhythm.     Pulses: Normal pulses.  Pulmonary:     Effort: Pulmonary effort is normal.     Breath sounds: Normal breath sounds.  Abdominal:     General: There is distension.  Musculoskeletal: Normal range of motion.     Right lower leg: No edema.     Left lower leg: No edema.  Skin:    General: Skin is warm and dry.     Capillary Refill: Capillary refill takes less than 2 seconds.  Neurological:     Mental Status: He is alert and oriented to person, place, and time.  Psychiatric:        Mood and Affect: Mood normal.         Future Appointments  Date Time Provider Department Center  12/02/2018  7:00 AM [facility] DEVICE REMOTES [facility]  12/26/2018  9:00 AM [facility] ECHO OP 1 [facility]  12/26/2018 10:00 AM [facility] PA/NP [facility] None    BP 122/85 (BP Location: Left Arm, Patient Position: Sitting, Cuff Size: Normal)   Pulse 66   Resp 16   Wt 260 lb (117.9 kg)   SpO2 95%  BMI 30.05 kg/m   Weight yesterday- 260 lb Last visit weight- 260 lb  Mr Linen was seen at home today and reported feeling well. He denied chest pain, SOB, headache, orthopnea, cough or fever. He did complain of intermittent dizziness but cannot advise the exact frequency or correlating events but has spoken to his PCP regarding this. He state he has been compliant with his medications since our last visit. His medications were verified and his pillbox was refilled. I will follow up next week.   [name], EMT 11/13/18  ACTION: Home visit completed Next visit planned for 1 week

## 2018-11-20 ENCOUNTER — Telehealth (HOSPITAL_COMMUNITY): Payer: Self-pay

## 2018-11-20 NOTE — Telephone Encounter (Signed)
I called Richard [name] to schedule an appointment. He stated he is in the midst of moving and his house was in disarray. He stated he would be able to handle taking his medications for the next few days and agreed to meet with me on Friday at his new house. I will call him on Friday to firm up a time.

## 2018-11-22 ENCOUNTER — Telehealth (HOSPITAL_COMMUNITY): Payer: Self-pay

## 2018-11-22 ENCOUNTER — Encounter (HOSPITAL_COMMUNITY): Payer: Medicare Other | Admitting: Cardiology

## 2018-11-22 NOTE — Telephone Encounter (Signed)
I called Richard [name] to see if he was available for a visit. He did not answer so I left a message requesting he call me back. I will follow up again next week.

## 2018-11-26 ENCOUNTER — Telehealth (HOSPITAL_COMMUNITY): Payer: Self-pay

## 2018-11-26 NOTE — Telephone Encounter (Signed)
I called Richard [name] to schedule an appointment after not hearing back from him last week. He stated things were chaotic due to the move on Friday but agreed to meet with me tomorrow at 14:00.

## 2018-11-27 ENCOUNTER — Other Ambulatory Visit (HOSPITAL_COMMUNITY): Payer: Self-pay

## 2018-11-27 ENCOUNTER — Telehealth (HOSPITAL_COMMUNITY): Payer: Self-pay | Admitting: Licensed Clinical Social Worker

## 2018-11-27 NOTE — Progress Notes (Signed)
Paramedicine Encounter    Patient ID: Richard [name], male    DOB: [birth year], 58 y.o.   MRN: [id number]   Patient Care Team: Ananias Pilgrim, MD as PCP - General (Family Medicine) Laurey Morale, MD as PCP - Advanced Heart Failure (Cardiology)  Patient Active Problem List   Diagnosis Date Noted  . Acute on chronic combined systolic and diastolic CHF (congestive heart failure) (HCC) 03/15/2018  . S/P right and left heart catheterization 11/02/2017  . Panic attacks 11/02/2017  . Non-STEMI (non-ST elevated myocardial infarction) (HCC)   . COPD (chronic obstructive pulmonary disease) (HCC)   . Hypertension   . CKD (chronic kidney disease)   . 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HCC)   . ICD (implantable cardioverter-defibrillator) in place   . GERD (gastroesophageal reflux disease)   . Diabetes mellitus with diabetic neuropathy, with long-term current use of insulin (HCC)     Current Outpatient Medications:  .  allopurinol (ZYLOPRIM) 100 MG tablet, Take 1 tablet (100 mg total) by mouth daily., Disp: 30 tablet, Rfl: 3 .  aspirin EC 81 MG EC tablet, Take 1 tablet (81 mg total) by mouth daily., Disp: 90 tablet, Rfl: 0 .  atorvastatin (LIPITOR) 40 MG tablet, TAKE 1 TABLET(40 MG) BY MOUTH DAILY AT 6 PM, Disp: 90 tablet, Rfl: 3 .  bisoprolol (ZEBETA) 5 MG tablet, Take 0.5 tablets (2.5 mg total) by mouth daily., Disp: 15 tablet, Rfl: 11 .  BREO ELLIPTA 100-25 MCG/INH AEPB, Inhale 1 puff into the lungs daily., Disp: , Rfl: 5 .  digoxin (LANOXIN) 0.125 MG tablet, TAKE 1 TABLET(0.125 MG) BY MOUTH DAILY, Disp: 30 tablet, Rfl: 3 .  diphenhydrAMINE (BENADRYL) 25 MG tablet, Take 25 mg by mouth at bedtime as needed., Disp: , Rfl:  .  DULoxetine (CYMBALTA) 60 MG capsule, Take 60 mg by mouth daily., Disp: , Rfl: 2 .  empagliflozin (JARDIANCE) 10 MG TABS tablet, Take 10 mg by mouth daily. , Disp: , Rfl:  .  eplerenone (INSPRA) 25 MG tablet, Take 1 tablet (25 mg total) by mouth daily., Disp: 30 tablet, Rfl: 6 .   hydrALAZINE (APRESOLINE) 25 MG tablet, Take 1 tablet (25 mg total) by mouth 3 (three) times daily., Disp: 270 tablet, Rfl: 3 .  Insulin Pen Needle (NOVOFINE) 30G X 8 MM MISC, Inject 10 each into the skin as needed., Disp: 100 each, Rfl: 0 .  insulin regular human CONCENTRATED (HUMULIN R U-500 KWIKPEN) 500 UNIT/ML kwikpen, USE AS DIRECTED PER SLIDING SCALE. MAX DAILY DOSE IS 400 UNITS., Disp: , Rfl:  .  isosorbide dinitrate (ISORDIL) 20 MG tablet, Take 1 tablet (20 mg total) by mouth 3 (three) times daily., Disp: 90 tablet, Rfl: 10 .  MAGNESIUM-OXIDE 400 (241.3 Mg) MG tablet, Take 400 mg by mouth daily., Disp: , Rfl: 11 .  omeprazole (PRILOSEC) 20 MG capsule, Take 20 mg by mouth daily., Disp: , Rfl: 1 .  potassium chloride SA (K-DUR,KLOR-CON) 20 MEQ tablet, Take 1 tablet (20 mEq total) by mouth daily., Disp: 90 tablet, Rfl: 3 .  pregabalin (LYRICA) 100 MG capsule, Take 1 capsule (100 mg total) by mouth 2 (two) times daily., Disp: 60 capsule, Rfl: 3 .  sacubitril-valsartan (ENTRESTO) 97-103 MG, Take 1 tablet by mouth 2 (two) times daily., Disp: 180 tablet, Rfl: 3 .  torsemide (DEMADEX) 20 MG tablet, Take 1 tablet (20 mg total) by mouth 2 (two) times daily., Disp: 180 tablet, Rfl: 3 .  Vitamin D, Cholecalciferol, 1000 units CAPS, Take 1,000 Units by  mouth daily., Disp: , Rfl: 11 .  ranitidine (ZANTAC) 150 MG tablet, Take 150 mg by mouth 2 (two) times daily., Disp: , Rfl:  Allergies  Allergen Reactions  . Codeine   . Coconut Oil Itching      Social History   Socioeconomic History  . Marital status: Divorced    Spouse name: Not on file  . Number of children: Not on file  . Years of education: Not on file  . Highest education level: Not on file  Occupational History  . Occupation: disability    Comment: stopped working in 2000 d/t occupational exposures  Social Needs  . Financial resource strain: Not on file  . Food insecurity:    Worry: Not on file    Inability: Not on file  .  Transportation needs:    Medical: Not on file    Non-medical: Not on file  Tobacco Use  . Smoking status: Former Smoker    Types: Cigarettes  . Smokeless tobacco: Never Used  . Tobacco comment: "smoked when I drank"  Substance and Sexual Activity  . 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 Drug use: Never  . Sexual activity: Not Currently  Lifestyle  . Physical activity:    Days per week: Not on file    Minutes per session: Not on file  . Stress: Not on file  Relationships  . Social connections:    Talks on phone: Not on file    Gets together: Not on file    Attends religious service: Not on file    Active member of club or organization: Not on file    Attends meetings of clubs or organizations: Not on file    Relationship status: Not on file  . Intimate partner violence:    Fear of current or ex partner: Not on file    Emotionally abused: Not on file    Physically abused: Not on file    Forced sexual activity: Not on file  Other Topics Concern  . Not on file  Social History Narrative   Patient lives at home with wife and some of his 10 children. He self-administers his own medications.    Physical Exam Cardiovascular:     Rate and Rhythm: Normal rate and regular rhythm.     Pulses: Normal pulses.  Pulmonary:     Effort: Pulmonary effort is normal.     Breath sounds: Normal breath sounds.  Musculoskeletal: Normal range of motion.     Right lower leg: Edema present.     Left lower leg: Edema present.  Skin:    General: Skin is warm and dry.     Capillary Refill: Capillary refill takes less than 2 seconds.  Neurological:     Mental Status: He is alert and oriented to person, place, and time.  Psychiatric:        Mood and Affect: Mood normal.         Future Appointments  Date Time Provider Department Center  12/02/2018  7:00 AM [facility] DEVICE REMOTES [facility]  12/26/2018  9:00 AM MC  ECHO OP 1 [facility]  12/26/2018 10:00 AM [facility] PA/NP [facility] None    BP 101/61 (BP Location: Left Arm, Patient Position: Sitting, Cuff Size: Normal)   Pulse 80   Resp 18   Wt 260 lb (117.9 kg)   SpO2 92%   BMI 30.05 kg/m   Weight yesterday- 260  lb Last visit weight- 260 lb  Mr Arguello was seen at home today and reported feeling well. He denied chest pain, SOB, headache, dizziness, orthopnea, cough or fever since our last visit. He reported being compliant with his medications and his weight has been stable since our last visit. We did not see each other last week because he has been moving so I am uncertain if he took his medications appropriately last week. His medications were verified and his pillbox was refilled. I will follow up next week.   [name], EMT 11/27/18  ACTION: Home visit completed Next visit planned for 1 week

## 2018-11-27 NOTE — Telephone Encounter (Signed)
CSW informed by [employer] paramedic that pt has changed homes and needs address updated with [employer].  CSW called [employer] and had address changed, next delivery due on 6/6- also had address changed in patient chart.  CSW will continue to follow and assist as needed  Burna Sis, LCSW Clinical Social Worker [facility] Desk#: [phone] Cell#: [phone]

## 2018-12-02 ENCOUNTER — Ambulatory Visit (INDEPENDENT_AMBULATORY_CARE_PROVIDER_SITE_OTHER): Payer: Medicare Other | Admitting: *Deleted

## 2018-12-02 DIAGNOSIS — I428 Other cardiomyopathies: Secondary | ICD-10-CM

## 2018-12-02 DIAGNOSIS — I5022 Chronic systolic (congestive) heart failure: Secondary | ICD-10-CM | POA: Diagnosis not present

## 2018-12-02 LAB — CUP PACEART REMOTE DEVICE CHECK
Battery Remaining Longevity: 113 mo
Battery Voltage: 3.01 V
Brady Statistic AP VP Percent: 0 %
Brady Statistic AP VS Percent: 0.04 %
Brady Statistic AS VP Percent: 0.05 %
Brady Statistic AS VS Percent: 99.91 %
Brady Statistic RA Percent Paced: 0.05 %
Brady Statistic RV Percent Paced: 0.05 %
Date Time Interrogation Session: 20200608062605
HighPow Impedance: 74 Ohm
Implantable Lead Implant Date: 20180719
Implantable Lead Implant Date: 20180719
Implantable Lead Location: 753859
Implantable Lead Location: 753860
Implantable Lead Model: 5076
Implantable Pulse Generator Implant Date: 20180719
Lead Channel Impedance Value: 361 Ohm
Lead Channel Impedance Value: 456 Ohm
Lead Channel Impedance Value: 456 Ohm
Lead Channel Pacing Threshold Amplitude: 0.75 V
Lead Channel Pacing Threshold Amplitude: 0.75 V
Lead Channel Pacing Threshold Pulse Width: 0.4 ms
Lead Channel Pacing Threshold Pulse Width: 0.4 ms
Lead Channel Sensing Intrinsic Amplitude: 10.375 mV
Lead Channel Sensing Intrinsic Amplitude: 10.375 mV
Lead Channel Sensing Intrinsic Amplitude: 4.125 mV
Lead Channel Sensing Intrinsic Amplitude: 4.125 mV
Lead Channel Setting Pacing Amplitude: 1.5 V
Lead Channel Setting Pacing Amplitude: 2 V
Lead Channel Setting Pacing Pulse Width: 0.4 ms
Lead Channel Setting Sensing Sensitivity: 0.3 mV

## 2018-12-03 ENCOUNTER — Telehealth (HOSPITAL_COMMUNITY): Payer: Self-pay

## 2018-12-03 NOTE — Telephone Encounter (Signed)
I called Richard [name] to schedule an appointment. He stated he would be available in the afternoon so we agreed to meet between 14:00 and 14:30.

## 2018-12-04 ENCOUNTER — Other Ambulatory Visit (HOSPITAL_COMMUNITY): Payer: Self-pay

## 2018-12-04 NOTE — Progress Notes (Signed)
Paramedicine Encounter    Patient ID: Richard [name], male    DOB: 08/02/1960, 58 y.o.   MRN: [id number]   Patient Care Team: Ananias PilgrimAsres, Alehegn, MD as PCP - General (Family Medicine) Laurey MoraleMcLean, Dalton S, MD as PCP - Advanced Heart Failure (Cardiology)  Patient Active Problem List   Diagnosis Date Noted  . Acute on chronic combined systolic and diastolic CHF (congestive heart failure) (HCC) 03/15/2018  . S/P right and left heart catheterization 11/02/2017  . Panic attacks 11/02/2017  . Non-STEMI (non-ST elevated myocardial infarction) (HCC)   . COPD (chronic obstructive pulmonary disease) (HCC)   . Hypertension   . CKD (chronic kidney disease)   . 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HCC)   . ICD (implantable cardioverter-defibrillator) in place   . GERD (gastroesophageal reflux disease)   . Diabetes mellitus with diabetic neuropathy, with long-term current use of insulin (HCC)     Current Outpatient Medications:  .  allopurinol (ZYLOPRIM) 100 MG tablet, Take 1 tablet (100 mg total) by mouth daily., Disp: 30 tablet, Rfl: 3 .  aspirin EC 81 MG EC tablet, Take 1 tablet (81 mg total) by mouth daily., Disp: 90 tablet, Rfl: 0 .  atorvastatin (LIPITOR) 40 MG tablet, TAKE 1 TABLET(40 MG) BY MOUTH DAILY AT 6 PM, Disp: 90 tablet, Rfl: 3 .  BREO ELLIPTA 100-25 MCG/INH AEPB, Inhale 1 puff into the lungs daily., Disp: , Rfl: 5 .  digoxin (LANOXIN) 0.125 MG tablet, TAKE 1 TABLET(0.125 MG) BY MOUTH DAILY, Disp: 30 tablet, Rfl: 3 .  diphenhydrAMINE (BENADRYL) 25 MG tablet, Take 25 mg by mouth at bedtime as needed., Disp: , Rfl:  .  DULoxetine (CYMBALTA) 60 MG capsule, Take 60 mg by mouth daily., Disp: , Rfl: 2 .  empagliflozin (JARDIANCE) 10 MG TABS tablet, Take 10 mg by mouth daily. , Disp: , Rfl:  .  hydrALAZINE (APRESOLINE) 25 MG tablet, Take 1 tablet (25 mg total) by mouth 3 (three) times daily., Disp: 270 tablet, Rfl: 3 .  Insulin Pen Needle (NOVOFINE) 30G X 8 MM MISC, Inject 10 each into the skin as needed.,  Disp: 100 each, Rfl: 0 .  insulin regular human CONCENTRATED (HUMULIN R U-500 KWIKPEN) 500 UNIT/ML kwikpen, USE AS DIRECTED PER SLIDING SCALE. MAX DAILY DOSE IS 400 UNITS., Disp: , Rfl:  .  isosorbide dinitrate (ISORDIL) 20 MG tablet, Take 1 tablet (20 mg total) by mouth 3 (three) times daily., Disp: 90 tablet, Rfl: 10 .  MAGNESIUM-OXIDE 400 (241.3 Mg) MG tablet, Take 400 mg by mouth daily., Disp: , Rfl: 11 .  omeprazole (PRILOSEC) 20 MG capsule, Take 20 mg by mouth daily., Disp: , Rfl: 1 .  potassium chloride SA (K-DUR,KLOR-CON) 20 MEQ tablet, Take 1 tablet (20 mEq total) by mouth daily., Disp: 90 tablet, Rfl: 3 .  pregabalin (LYRICA) 100 MG capsule, Take 1 capsule (100 mg total) by mouth 2 (two) times daily., Disp: 60 capsule, Rfl: 3 .  sacubitril-valsartan (ENTRESTO) 97-103 MG, Take 1 tablet by mouth 2 (two) times daily., Disp: 180 tablet, Rfl: 3 .  torsemide (DEMADEX) 20 MG tablet, Take 1 tablet (20 mg total) by mouth 2 (two) times daily., Disp: 180 tablet, Rfl: 3 .  Vitamin D, Cholecalciferol, 1000 units CAPS, Take 1,000 Units by mouth daily., Disp: , Rfl: 11 .  bisoprolol (ZEBETA) 5 MG tablet, Take 0.5 tablets (2.5 mg total) by mouth daily. (Patient not taking: Reported on 12/04/2018), Disp: 15 tablet, Rfl: 11 .  eplerenone (INSPRA) 25 MG tablet, Take 1  tablet (25 mg total) by mouth daily. (Patient not taking: Reported on 12/04/2018), Disp: 30 tablet, Rfl: 6 .  ranitidine (ZANTAC) 150 MG tablet, Take 150 mg by mouth 2 (two) times daily., Disp: , Rfl:  Allergies  Allergen Reactions  . Codeine   . Coconut Oil Itching      Social History   Socioeconomic History  . Marital status: Divorced    Spouse name: Not on file  . Number of children: Not on file  . Years of education: Not on file  . Highest education level: Not on file  Occupational History  . Occupation: disability    Comment: stopped working in 2000 d/t occupational exposures  Social Needs  . Financial resource strain: Not on  file  . Food insecurity:    Worry: Not on file    Inability: Not on file  . Transportation needs:    Medical: Not on file    Non-medical: Not on file  Tobacco Use  . Smoking status: Former Smoker    Types: Cigarettes  . Smokeless tobacco: Never Used  . Tobacco comment: "smoked when I drank"  Substance and Sexual Activity  . 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 Drug use: Never  . Sexual activity: Not Currently  Lifestyle  . Physical activity:    Days per week: Not on file    Minutes per session: Not on file  . Stress: Not on file  Relationships  . Social connections:    Talks on phone: Not on file    Gets together: Not on file    Attends religious service: Not on file    Active member of club or organization: Not on file    Attends meetings of clubs or organizations: Not on file    Relationship status: Not on file  . Intimate partner violence:    Fear of current or ex partner: Not on file    Emotionally abused: Not on file    Physically abused: Not on file    Forced sexual activity: Not on file  Other Topics Concern  . Not on file  Social History Narrative   Patient lives at home with wife and some of his 10 children. He self-administers his own medications.    Physical Exam Cardiovascular:     Rate and Rhythm: Normal rate and regular rhythm.     Pulses: Normal pulses.  Pulmonary:     Effort: Pulmonary effort is normal.     Breath sounds: Normal breath sounds.  Musculoskeletal: Normal range of motion.     Right lower leg: Edema present.     Left lower leg: Edema present.  Skin:    General: Skin is warm and dry.     Capillary Refill: Capillary refill takes less than 2 seconds.  Neurological:     Mental Status: He is alert and oriented to person, place, and time.  Psychiatric:        Mood and Affect: Mood normal.         Future Appointments  Date Time Provider Department Center  12/26/2018  9:00  AM [facility] OP 1 [facility]  12/26/2018 10:00 AM [facility] PA/NP [facility] None    BP 90/66 (BP Location: Left Arm, Patient Position: Sitting, Cuff Size: Normal)   Pulse 78   Resp 18   Wt 268 lb (121.6 kg)   SpO2 90%   BMI 30.97 kg/m   Weight  yesterday- 270 lb Last visit weight- 260 lb  Mr Tiemann was seen at home today and reported feeling well. He denied chest pain, SOB, headache, dizziness, orthopnea, cough or fever since our last visit. He stated he has been compliant with his medications over the past week but his weight has increased significantly. We spoke about this and he stated had been working in the yard the past couple of days so he had been drinking a lot of water. I encouraged him to replace the fluid he loses via sweating but to be careful and not surpass his daily limit. He was understanding and agreeable. His medications were verified and his pillbox was refilled. He did not pick up eplerenone, bisoprolol or jardiance from the pharmacy so they were not able to be placed in his pillbox. He advised he would pick the medications up tomorrow morning and let me know so I can come back and put it in his pillbox.   Jacquiline Doe, EMT 12/04/18  ACTION: Home visit completed Next visit planned for tomorrow

## 2018-12-09 NOTE — Progress Notes (Signed)
Remote ICD transmission.   

## 2018-12-13 ENCOUNTER — Other Ambulatory Visit (HOSPITAL_COMMUNITY): Payer: Self-pay

## 2018-12-13 ENCOUNTER — Other Ambulatory Visit: Payer: Self-pay | Admitting: Internal Medicine

## 2018-12-13 ENCOUNTER — Telehealth (HOSPITAL_COMMUNITY): Payer: Self-pay

## 2018-12-13 NOTE — Telephone Encounter (Signed)
Left message for Richard [name] to contact me back to schedule an appointment for today.

## 2018-12-13 NOTE — Progress Notes (Signed)
Paramedicine Encounter    Patient ID: Richard [name], male    DOB: 1961-01-23, 58 y.o.   MRN: [id number]   Patient Care Team: Ananias Pilgrim, MD as PCP - General (Family Medicine) Laurey Morale, MD as PCP - Advanced Heart Failure (Cardiology)  Patient Active Problem List   Diagnosis Date Noted  . Acute on chronic combined systolic and diastolic CHF (congestive heart failure) (HCC) 03/15/2018  . S/P right and left heart catheterization 11/02/2017  . Panic attacks 11/02/2017  . Non-STEMI (non-ST elevated myocardial infarction) (HCC)   . COPD (chronic obstructive pulmonary disease) (HCC)   . Hypertension   . CKD (chronic kidney disease)   . 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HCC)   . ICD (implantable cardioverter-defibrillator) in place   . GERD (gastroesophageal reflux disease)   . Diabetes mellitus with diabetic neuropathy, with long-term current use of insulin (HCC)     Current Outpatient Medications:  .  allopurinol (ZYLOPRIM) 100 MG tablet, Take 1 tablet (100 mg total) by mouth daily., Disp: 30 tablet, Rfl: 3 .  atorvastatin (LIPITOR) 40 MG tablet, TAKE 1 TABLET(40 MG) BY MOUTH DAILY AT 6 PM, Disp: 90 tablet, Rfl: 3 .  BREO ELLIPTA 100-25 MCG/INH AEPB, Inhale 1 puff into the lungs daily., Disp: , Rfl: 5 .  digoxin (LANOXIN) 0.125 MG tablet, TAKE 1 TABLET(0.125 MG) BY MOUTH DAILY, Disp: 30 tablet, Rfl: 3 .  diphenhydrAMINE (BENADRYL) 25 MG tablet, Take 25 mg by mouth at bedtime as needed., Disp: , Rfl:  .  DULoxetine (CYMBALTA) 60 MG capsule, Take 60 mg by mouth daily., Disp: , Rfl: 2 .  eplerenone (INSPRA) 25 MG tablet, Take 1 tablet (25 mg total) by mouth daily., Disp: 30 tablet, Rfl: 6 .  hydrALAZINE (APRESOLINE) 25 MG tablet, Take 1 tablet (25 mg total) by mouth 3 (three) times daily., Disp: 270 tablet, Rfl: 3 .  Insulin Pen Needle (NOVOFINE) 30G X 8 MM MISC, Inject 10 each into the skin as needed., Disp: 100 each, Rfl: 0 .  insulin regular human CONCENTRATED (HUMULIN R U-500 KWIKPEN)  500 UNIT/ML kwikpen, USE AS DIRECTED PER SLIDING SCALE. MAX DAILY DOSE IS 400 UNITS., Disp: , Rfl:  .  isosorbide dinitrate (ISORDIL) 20 MG tablet, Take 1 tablet (20 mg total) by mouth 3 (three) times daily., Disp: 90 tablet, Rfl: 10 .  MAGNESIUM-OXIDE 400 (241.3 Mg) MG tablet, Take 400 mg by mouth daily., Disp: , Rfl: 11 .  omeprazole (PRILOSEC) 20 MG capsule, Take 20 mg by mouth daily., Disp: , Rfl: 1 .  potassium chloride SA (K-DUR,KLOR-CON) 20 MEQ tablet, Take 1 tablet (20 mEq total) by mouth daily., Disp: 90 tablet, Rfl: 3 .  pregabalin (LYRICA) 100 MG capsule, Take 1 capsule (100 mg total) by mouth 2 (two) times daily., Disp: 60 capsule, Rfl: 3 .  sacubitril-valsartan (ENTRESTO) 97-103 MG, Take 1 tablet by mouth 2 (two) times daily., Disp: 180 tablet, Rfl: 3 .  torsemide (DEMADEX) 20 MG tablet, Take 1 tablet (20 mg total) by mouth 2 (two) times daily., Disp: 180 tablet, Rfl: 3 .  Vitamin D, Cholecalciferol, 1000 units CAPS, Take 1,000 Units by mouth daily., Disp: , Rfl: 11 .  aspirin EC 81 MG EC tablet, Take 1 tablet (81 mg total) by mouth daily., Disp: 90 tablet, Rfl: 0 .  bisoprolol (ZEBETA) 5 MG tablet, Take 0.5 tablets (2.5 mg total) by mouth daily. (Patient not taking: Reported on 12/04/2018), Disp: 15 tablet, Rfl: 11 .  empagliflozin (JARDIANCE) 10 MG TABS  tablet, Take 10 mg by mouth daily. , Disp: , Rfl:  .  ranitidine (ZANTAC) 150 MG tablet, Take 150 mg by mouth 2 (two) times daily., Disp: , Rfl:  Allergies  Allergen Reactions  . Codeine   . Coconut Oil Itching     Social History   Socioeconomic History  . Marital status: Divorced    Spouse name: Not on file  . Number of children: Not on file  . Years of education: Not on file  . Highest education level: Not on file  Occupational History  . Occupation: disability    Comment: stopped working in 2000 d/t occupational exposures  Social Needs  . Financial resource strain: Not on file  . Food insecurity    Worry: Not on file     Inability: Not on file  . Transportation needs    Medical: Not on file    Non-medical: Not on file  Tobacco Use  . Smoking status: Former Smoker    Types: Cigarettes  . Smokeless tobacco: Never Used  . Tobacco comment: "smoked when I drank"  Substance and Sexual Activity  . 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 Drug use: Never  . Sexual activity: Not Currently  Lifestyle  . Physical activity    Days per week: Not on file    Minutes per session: Not on file  . Stress: Not on file  Relationships  . Social Herbalist on phone: Not on file    Gets together: Not on file    Attends religious service: Not on file    Active member of club or organization: Not on file    Attends meetings of clubs or organizations: Not on file    Relationship status: Not on file  . Intimate partner violence    Fear of current or ex partner: Not on file    Emotionally abused: Not on file    Physically abused: Not on file    Forced sexual activity: Not on file  Other Topics Concern  . Not on file  Social History Narrative   Patient lives at home with wife and some of his 10 children. He self-administers his own medications.    Physical Exam Vitals signs reviewed.  HENT:     Nose: Nose normal.     Mouth/Throat:     Mouth: Mucous membranes are dry.  Eyes:     Pupils: Pupils are equal, round, and reactive to light.  Neck:     Musculoskeletal: Normal range of motion.  Cardiovascular:     Rate and Rhythm: Normal rate and regular rhythm.     Pulses: Normal pulses.  Pulmonary:     Effort: Pulmonary effort is normal.  Abdominal:     General: There is distension.  Musculoskeletal: Normal range of motion.     Right lower leg: No edema.     Left lower leg: No edema.  Skin:    General: Skin is warm and dry.     Capillary Refill: Capillary refill takes less than 2 seconds.  Neurological:     Mental Status: He is alert. Mental  status is at baseline.  Psychiatric:        Mood and Affect: Mood normal.     Arrived for home visit for Richard [name] who was alert and oriented walking to greet me at the front door. Richard [name] stated that his stomach was "bloated"  and he feels short of breath when he moves around a lot. Mr. Lawerance BachBurns stated that he has been taking his medications however I noted 3 dosing's in his pill box in which he forgot to take. [name] abdomen noted to be distended. [name] medications were reviewed and verified as noted. [name] vitals were as recorded and reviewed. [name] CBG was 274, he states he just got done eating fruit salad prior to my arrival and had not yet taken his insulin. Patient also complained about having right hand "numbness" and pain which he states is from his neuropathy. I advised the patient to speak to his PCP or Endocrinologist for advise on same. Mr. Lawerance BachBurns understood to be compliant with medications as prescribed and placed in pill box. Visit complete.    Meds Ordered: -Cymbalta -Jardiance -Mag Oxide -Pregabalin   CBG- 274 WT- 267lbs     Future Appointments  Date Time Provider Department Center  12/26/2018  9:00 AM [facility]  12/26/2018 10:00 AM [facility] PA/NP [facility] None  03/04/2019  8:10 AM [facility] DEVICE REMOTES [facility]  06/03/2019  8:10 AM [facility] DEVICE REMOTES [facility]  09/02/2019  8:10 AM [facility] DEVICE REMOTES [facility]  12/02/2019  8:10 AM [facility] DEVICE REMOTES [facility]  03/02/2020  8:10 AM [facility] DEVICE REMOTES [facility]  06/01/2020  8:10 AM [facility] DEVICE REMOTES [facility]     ACTION: Home visit completed Next visit planned for Next visit with Z. Renae GlossShelton

## 2018-12-25 ENCOUNTER — Telehealth (HOSPITAL_COMMUNITY): Payer: Self-pay

## 2018-12-25 NOTE — Telephone Encounter (Signed)
I called Richard [name] to schedule an appointment. He did not answer so I left a message requesting he call me back when he is able.

## 2018-12-26 ENCOUNTER — Encounter (HOSPITAL_COMMUNITY): Payer: Medicare Other

## 2018-12-26 ENCOUNTER — Ambulatory Visit (HOSPITAL_COMMUNITY): Admission: RE | Admit: 2018-12-26 | Payer: Medicare Other | Source: Ambulatory Visit

## 2019-01-01 ENCOUNTER — Telehealth (HOSPITAL_COMMUNITY): Payer: Self-pay

## 2019-01-02 NOTE — Telephone Encounter (Signed)
I called Mr [place] to schedule an appointment. A young man answered and stated that Mr Figley was not available. I asked that he deliver a message that I called and requested he call me back. The person was understanding agreeable.

## 2019-01-06 ENCOUNTER — Telehealth (HOSPITAL_COMMUNITY): Payer: Self-pay | Admitting: Licensed Clinical Social Worker

## 2019-01-06 NOTE — Telephone Encounter (Signed)
Pt enrolled in [employer] Me[employer] 3 month study through [facility]- patient's 3 month period has now ended so CSW spoke with pt to complete an exit interview regarding patient's experience:  1. Since being on Moms Meals have you noticed any changes in your health or the way you feel physically? Could tell he didn't retain fluid as much Weight loss? No weight loss but weight stayed stable which was his goal.  Improvement in BP? unsure  2. What do you like about the program? Liked that it was convenient and didn't have to worry about making his own healthy food. What would you like to change/add? Would want more variety  3. How did you get food prior to [place]? a. Did you get food stamps? no b. Did someone help financially? no c. Did you access food banks? Not recently d. Do you anticipate any issues transitioning back to getting your own food after this program ends? no  4. Would you be interested in future food pilots? yes  5. Would you like a dietary consult? yes  6. Do you have internet access? Tablet/Smart Phone? computer   Richard Ny, LCSW Clinical Social Worker [facility] Desk#: [phone] Cell#: [phone]

## 2019-01-08 ENCOUNTER — Other Ambulatory Visit (HOSPITAL_COMMUNITY): Payer: Self-pay

## 2019-01-08 ENCOUNTER — Telehealth (HOSPITAL_COMMUNITY): Payer: Self-pay | Admitting: Cardiology

## 2019-01-08 DIAGNOSIS — I5022 Chronic systolic (congestive) heart failure: Secondary | ICD-10-CM

## 2019-01-08 NOTE — Telephone Encounter (Signed)
Richard [name] called during patients home visit to report low b/p reading.  82/60 Patient asymptomatic  Weight 260 lb   Per Dr Aundra Dubin Continue to monitor, if blood pressure continues to run lower may return for office visit. Return for labs (bmet and dig)

## 2019-01-08 NOTE — Progress Notes (Signed)
Paramedicine Encounter    Patient ID: Richard [name], male    DOB: [birth year], 58 y.o.   MRN: [id number]   Patient Care Team: Ananias PilgrimAsres, Alehegn, MD as PCP - General (Family Medicine) Laurey MoraleMcLean, Dalton S, MD as PCP - Advanced Heart Failure (Cardiology)  Patient Active Problem List   Diagnosis Date Noted  . Acute on chronic combined systolic and diastolic CHF (congestive heart failure) (HCC) 03/15/2018  . S/P right and left heart catheterization 11/02/2017  . Panic attacks 11/02/2017  . Non-STEMI (non-ST elevated myocardial infarction) (HCC)   . COPD (chronic obstructive pulmonary disease) (HCC)   . Hypertension   . CKD (chronic kidney disease)   . 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HCC)   . ICD (implantable cardioverter-defibrillator) in place   . GERD (gastroesophageal reflux disease)   . Diabetes mellitus with diabetic neuropathy, with long-term current use of insulin (HCC)     Current Outpatient Medications:  .  allopurinol (ZYLOPRIM) 100 MG tablet, Take 1 tablet (100 mg total) by mouth daily., Disp: 30 tablet, Rfl: 3 .  aspirin EC 81 MG EC tablet, Take 1 tablet (81 mg total) by mouth daily., Disp: 90 tablet, Rfl: 0 .  atorvastatin (LIPITOR) 40 MG tablet, TAKE 1 TABLET(40 MG) BY MOUTH DAILY AT 6 PM, Disp: 90 tablet, Rfl: 3 .  BREO ELLIPTA 100-25 MCG/INH AEPB, Inhale 1 puff into the lungs daily., Disp: , Rfl: 5 .  digoxin (LANOXIN) 0.125 MG tablet, TAKE 1 TABLET(0.125 MG) BY MOUTH DAILY, Disp: 30 tablet, Rfl: 3 .  diphenhydrAMINE (BENADRYL) 25 MG tablet, Take 25 mg by mouth at bedtime as needed., Disp: , Rfl:  .  DULoxetine (CYMBALTA) 60 MG capsule, Take 60 mg by mouth daily., Disp: , Rfl: 2 .  empagliflozin (JARDIANCE) 10 MG TABS tablet, Take 10 mg by mouth daily. , Disp: , Rfl:  .  eplerenone (INSPRA) 25 MG tablet, Take 1 tablet (25 mg total) by mouth daily., Disp: 30 tablet, Rfl: 6 .  hydrALAZINE (APRESOLINE) 25 MG tablet, Take 1 tablet (25 mg total) by mouth 3 (three) times daily., Disp: 270  tablet, Rfl: 3 .  Insulin Pen Needle (NOVOFINE) 30G X 8 MM MISC, Inject 10 each into the skin as needed., Disp: 100 each, Rfl: 0 .  insulin regular human CONCENTRATED (HUMULIN R U-500 KWIKPEN) 500 UNIT/ML kwikpen, USE AS DIRECTED PER SLIDING SCALE. MAX DAILY DOSE IS 400 UNITS., Disp: , Rfl:  .  isosorbide dinitrate (ISORDIL) 20 MG tablet, Take 1 tablet (20 mg total) by mouth 3 (three) times daily., Disp: 90 tablet, Rfl: 10 .  MAGNESIUM-OXIDE 400 (241.3 Mg) MG tablet, Take 400 mg by mouth daily., Disp: , Rfl: 11 .  omeprazole (PRILOSEC) 20 MG capsule, Take 20 mg by mouth daily., Disp: , Rfl: 1 .  potassium chloride SA (K-DUR,KLOR-CON) 20 MEQ tablet, Take 1 tablet (20 mEq total) by mouth daily., Disp: 90 tablet, Rfl: 3 .  pregabalin (LYRICA) 100 MG capsule, Take 1 capsule (100 mg total) by mouth 2 (two) times daily., Disp: 60 capsule, Rfl: 3 .  sacubitril-valsartan (ENTRESTO) 97-103 MG, Take 1 tablet by mouth 2 (two) times daily., Disp: 180 tablet, Rfl: 3 .  torsemide (DEMADEX) 20 MG tablet, Take 1 tablet (20 mg total) by mouth 2 (two) times daily., Disp: 180 tablet, Rfl: 3 .  Vitamin D, Cholecalciferol, 1000 units CAPS, Take 1,000 Units by mouth daily., Disp: , Rfl: 11 .  bisoprolol (ZEBETA) 5 MG tablet, Take 0.5 tablets (2.5 mg total) by mouth  daily. (Patient not taking: Reported on 12/04/2018), Disp: 15 tablet, Rfl: 11 .  ranitidine (ZANTAC) 150 MG tablet, Take 150 mg by mouth 2 (two) times daily., Disp: , Rfl:  Allergies  Allergen Reactions  . Codeine   . Coconut Oil Itching      Social History   Socioeconomic History  . Marital status: Divorced    Spouse name: Not on file  . Number of children: Not on file  . Years of education: Not on file  . Highest education level: Not on file  Occupational History  . Occupation: disability    Comment: stopped working in 2000 d/t occupational exposures  Social Needs  . Financial resource strain: Not on file  . Food insecurity    Worry: Not on  file    Inability: Not on file  . Transportation needs    Medical: Not on file    Non-medical: Not on file  Tobacco Use  . Smoking status: Former Smoker    Types: Cigarettes  . Smokeless tobacco: Never Used  . Tobacco comment: "smoked when I drank"  Substance and Sexual Activity  . 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 Drug use: Never  . Sexual activity: Not Currently  Lifestyle  . Physical activity    Days per week: Not on file    Minutes per session: Not on file  . Stress: Not on file  Relationships  . Social Herbalist on phone: Not on file    Gets together: Not on file    Attends religious service: Not on file    Active member of club or organization: Not on file    Attends meetings of clubs or organizations: Not on file    Relationship status: Not on file  . Intimate partner violence    Fear of current or ex partner: Not on file    Emotionally abused: Not on file    Physically abused: Not on file    Forced sexual activity: Not on file  Other Topics Concern  . Not on file  Social History Narrative   Patient lives at home with wife and some of his 10 children. He self-administers his own medications.    Physical Exam Cardiovascular:     Rate and Rhythm: Normal rate and regular rhythm.     Pulses: Normal pulses.  Pulmonary:     Effort: Pulmonary effort is normal.     Breath sounds: Normal breath sounds.  Abdominal:     General: There is distension.  Musculoskeletal: Normal range of motion.     Right lower leg: Edema present.     Left lower leg: Edema present.  Skin:    General: Skin is warm and dry.     Capillary Refill: Capillary refill takes less than 2 seconds.  Neurological:     Mental Status: He is alert and oriented to person, place, and time.  Psychiatric:        Mood and Affect: Mood normal.         Future Appointments  Date Time Provider Tool  02/14/2019  9:15  AM [facility] ECHO OP 2 [facility]  02/14/2019 10:30 AM [facility] PA/NP [facility] None  03/04/2019  8:10 AM [facility] DEVICE REMOTES [facility]  06/03/2019  8:10 AM [facility] DEVICE REMOTES [facility]  09/02/2019  8:10 AM [facility] DEVICE REMOTES [facility]  12/02/2019  8:10 AM [facility] DEVICE REMOTES  [facility]  03/02/2020  8:10 AM [facility] DEVICE REMOTES [facility]  06/01/2020  8:10 AM [facility] DEVICE REMOTES [facility]    BP (!) 82/60 (BP Location: Left Arm, Patient Position: Sitting, Cuff Size: Normal)   Pulse 86   Resp 18   Wt 260 lb (117.9 kg)   SpO2 94%   BMI 30.05 kg/m   Weight yesterday- 262 lb Last visit weight- 267 lb   Mr Echard was seen at [facility] today and reported feeling well. He denied chest pain, SOB, headache, dizziness, orthopnea, cough or fever since our last visit. He stated he has been compliant with his medications for the past week however he was out of bisoprolol and nearly out of eplerenone.Upon checking his vital signs he was found to be hypotensive at 82/60 mmHg. I contacted the clinic who advised to monitor his blood pressure closely but no changes were needed at this time. His medications were verified and his pillbox was adjusted. All necessary refills were ordered and I will follow up next week.   [name], EMT 01/08/19  ACTION: Home visit completed Next visit planned for 1 week

## 2019-01-14 ENCOUNTER — Telehealth (HOSPITAL_COMMUNITY): Payer: Self-pay

## 2019-01-14 NOTE — Telephone Encounter (Signed)
I called Richard [name] to schedule an appointment. He stated he would be available tomorrow in the afternoon so we greed to meet at 13:30.

## 2019-01-15 ENCOUNTER — Other Ambulatory Visit (HOSPITAL_COMMUNITY): Payer: Self-pay

## 2019-01-15 NOTE — Progress Notes (Signed)
Paramedicine Encounter    Patient ID: Richard [name], male    DOB: [birth year], 58 y.o.   MRN: [id number]   Patient Care Team: Wallene Dales, MD as PCP - General (Family Medicine) Larey Dresser, MD as PCP - Advanced Heart Failure (Cardiology)  Patient Active Problem List   Diagnosis Date Noted  . Acute on chronic combined systolic and diastolic CHF (congestive heart failure) ([place]) 03/15/2018  . S/P right and left heart catheterization 11/02/2017  . Panic attacks 11/02/2017  . Non-STEMI (non-ST elevated myocardial infarction) ([place])   . COPD (chronic obstructive pulmonary disease) ([place])   . Hypertension   . CKD (chronic kidney disease)   . 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[place])   . ICD (implantable cardioverter-defibrillator) in place   . GERD (gastroesophageal reflux disease)   . Diabetes mellitus with diabetic neuropathy, with long-term current use of insulin (HCC)     Current Outpatient Medications:  .  allopurinol (ZYLOPRIM) 100 MG tablet, Take 1 tablet (100 mg total) by mouth daily., Disp: 30 tablet, Rfl: 3 .  aspirin EC 81 MG EC tablet, Take 1 tablet (81 mg total) by mouth daily., Disp: 90 tablet, Rfl: 0 .  atorvastatin (LIPITOR) 40 MG tablet, TAKE 1 TABLET(40 MG) BY MOUTH DAILY AT 6 PM, Disp: 90 tablet, Rfl: 3 .  BREO ELLIPTA 100-25 MCG/INH AEPB, Inhale 1 puff into the lungs daily., Disp: , Rfl: 5 .  digoxin (LANOXIN) 0.125 MG tablet, TAKE 1 TABLET(0.125 MG) BY MOUTH DAILY, Disp: 30 tablet, Rfl: 3 .  diphenhydrAMINE (BENADRYL) 25 MG tablet, Take 25 mg by mouth at bedtime as needed., Disp: , Rfl:  .  DULoxetine (CYMBALTA) 60 MG capsule, Take 60 mg by mouth daily., Disp: , Rfl: 2 .  empagliflozin (JARDIANCE) 10 MG TABS tablet, Take 10 mg by mouth daily. , Disp: , Rfl:  .  hydrALAZINE (APRESOLINE) 25 MG tablet, Take 1 tablet (25 mg total) by mouth 3 (three) times daily., Disp: 270 tablet, Rfl: 3 .  Insulin Pen Needle (NOVOFINE) 30G X 8 MM MISC, Inject 10 each into the skin as needed.,  Disp: 100 each, Rfl: 0 .  insulin regular human CONCENTRATED (HUMULIN R U-500 KWIKPEN) 500 UNIT/ML kwikpen, USE AS DIRECTED PER SLIDING SCALE. MAX DAILY DOSE IS 400 UNITS., Disp: , Rfl:  .  isosorbide dinitrate (ISORDIL) 20 MG tablet, Take 1 tablet (20 mg total) by mouth 3 (three) times daily., Disp: 90 tablet, Rfl: 10 .  MAGNESIUM-OXIDE 400 (241.3 Mg) MG tablet, Take 400 mg by mouth daily., Disp: , Rfl: 11 .  omeprazole (PRILOSEC) 20 MG capsule, Take 20 mg by mouth daily., Disp: , Rfl: 1 .  potassium chloride SA (K-DUR,KLOR-CON) 20 MEQ tablet, Take 1 tablet (20 mEq total) by mouth daily., Disp: 90 tablet, Rfl: 3 .  pregabalin (LYRICA) 100 MG capsule, Take 1 capsule (100 mg total) by mouth 2 (two) times daily., Disp: 60 capsule, Rfl: 3 .  sacubitril-valsartan (ENTRESTO) 97-103 MG, Take 1 tablet by mouth 2 (two) times daily., Disp: 180 tablet, Rfl: 3 .  torsemide (DEMADEX) 20 MG tablet, Take 1 tablet (20 mg total) by mouth 2 (two) times daily., Disp: 180 tablet, Rfl: 3 .  bisoprolol (ZEBETA) 5 MG tablet, Take 0.5 tablets (2.5 mg total) by mouth daily. (Patient not taking: Reported on 12/04/2018), Disp: 15 tablet, Rfl: 11 .  eplerenone (INSPRA) 25 MG tablet, Take 1 tablet (25 mg total) by mouth daily. (Patient not taking: Reported on 01/15/2019), Disp: 30 tablet, Rfl: 6 .  ranitidine (ZANTAC) 150 MG tablet, Take 150 mg by mouth 2 (two) times daily., Disp: , Rfl:  .  Vitamin D, Cholecalciferol, 1000 units CAPS, Take 1,000 Units by mouth daily., Disp: , Rfl: 11 Allergies  Allergen Reactions  . Codeine   . Coconut Oil Itching      Social History   Socioeconomic History  . Marital status: Divorced    Spouse name: Not on file  . Number of children: Not on file  . Years of education: Not on file  . Highest education level: Not on file  Occupational History  . Occupation: disability    Comment: stopped working in 2000 d/t occupational exposures  Social Needs  . Financial resource strain: Not on  file  . Food insecurity    Worry: Not on file    Inability: Not on file  . Transportation needs    Medical: Not on file    Non-medical: Not on file  Tobacco Use  . Smoking status: Former Smoker    Types: Cigarettes  . Smokeless tobacco: Never Used  . Tobacco comment: "smoked when I drank"  Substance and Sexual Activity  . 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 Drug use: Never  . Sexual activity: Not Currently  Lifestyle  . Physical activity    Days per week: Not on file    Minutes per session: Not on file  . Stress: Not on file  Relationships  . Social Musician on phone: Not on file    Gets together: Not on file    Attends religious service: Not on file    Active member of club or organization: Not on file    Attends meetings of clubs or organizations: Not on file    Relationship status: Not on file  . Intimate partner violence    Fear of current or ex partner: Not on file    Emotionally abused: Not on file    Physically abused: Not on file    Forced sexual activity: Not on file  Other Topics Concern  . Not on file  Social History Narrative   Patient lives at home with wife and some of his 10 children. He self-administers his own medications.    Physical Exam Cardiovascular:     Rate and Rhythm: Normal rate and regular rhythm.     Pulses: Normal pulses.  Pulmonary:     Effort: Pulmonary effort is normal.     Breath sounds: Normal breath sounds.  Abdominal:     General: There is distension.  Musculoskeletal: Normal range of motion.     Right lower leg: Edema present.     Left lower leg: Edema present.  Skin:    General: Skin is warm and dry.     Capillary Refill: Capillary refill takes less than 2 seconds.  Neurological:     Mental Status: He is alert and oriented to person, place, and time.  Psychiatric:        Mood and Affect: Mood normal.         Future Appointments  Date Time  Provider Department Center  01/16/2019  2:00 PM [facility] LAB [facility] None  02/14/2019  9:15 AM MC ECHO OP 2 [facility]  02/14/2019 10:30 AM [facility] PA/NP [facility] None  03/04/2019  8:10 AM [facility] DEVICE REMOTES [facility]  06/03/2019  8:10 AM [facility] DEVICE REMOTES [facility]  09/02/2019  8:10 AM  [facility] DEVICE REMOTES [facility]  12/02/2019  8:10 AM [facility] DEVICE REMOTES [facility]  03/02/2020  8:10 AM [facility] DEVICE REMOTES [facility]  06/01/2020  8:10 AM [facility] DEVICE REMOTES [facility]    BP 105/71 (BP Location: Left Arm, Patient Position: Sitting, Cuff Size: Normal)   Pulse 70   Resp 18   Wt 262 lb (118.8 kg)   SpO2 93%   BMI 30.28 kg/m   Weight yesterday- 262 lb Last visit weight- 260 lb  Mr Lawerance BachBurns was seen at home today and reported generally well. He denied SOB, headache, dizziness, orthopnea, cough or fever  Since our last visit. He did complain of a fleeting episode of sharp chest pain last night which occurred after dinner and resolved without intervention. He stated he has been compliant with his medications however he had missed several doses of potassium, isosorbide and hydralazine. He stated he was unaware that he had missed these but would do better in the coming well. He did not pick up eplerenone or bisoprolol last week so he has been off of it for two weeks. All other medications were verified and his pillbox was refilled. I contacted the pharmacy and they confirmed that he would be able to pick the medications up today. I requested he call me when he has the medications and I would return to finish filling his pillbox. Mr Lawerance BachBurns was understanding and agreeable.   Jacqualine CodeZackery R Kniyah Khun, EMT 01/15/19  ACTION: Home visit completed Next visit planned for 1 week

## 2019-01-16 ENCOUNTER — Other Ambulatory Visit (HOSPITAL_COMMUNITY): Payer: Medicare Other

## 2019-01-19 ENCOUNTER — Other Ambulatory Visit (HOSPITAL_COMMUNITY): Payer: Self-pay | Admitting: Student

## 2019-01-21 ENCOUNTER — Telehealth (HOSPITAL_COMMUNITY): Payer: Self-pay

## 2019-01-21 NOTE — Telephone Encounter (Signed)
I called Richard [name] to schedule an appointment. He stated he would be available to meet tomorrow so we agreed on 13:00

## 2019-01-22 ENCOUNTER — Other Ambulatory Visit (HOSPITAL_COMMUNITY): Payer: Self-pay

## 2019-01-22 MED ORDER — SACUBITRIL-VALSARTAN 97-103 MG PO TABS: 1.0000 | ORAL_TABLET | Freq: Two times a day (BID) | ORAL | 3 refills | Status: DC

## 2019-01-22 NOTE — Progress Notes (Signed)
Paramedicine Encounter    Patient ID: Richard [name], male    DOB: 1961/05/16, 58 y.o.   MRN: [id number]   Patient Care Team: Ananias PilgrimAsres, Alehegn, MD as PCP - General (Family Medicine) Laurey MoraleMcLean, Dalton S, MD as PCP - Advanced Heart Failure (Cardiology)  Patient Active Problem List   Diagnosis Date Noted  . Acute on chronic combined systolic and diastolic CHF (congestive heart failure) (HCC) 03/15/2018  . S/P right and left heart catheterization 11/02/2017  . Panic attacks 11/02/2017  . Non-STEMI (non-ST elevated myocardial infarction) (HCC)   . COPD (chronic obstructive pulmonary disease) (HCC)   . Hypertension   . CKD (chronic kidney disease)   . 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HCC)   . ICD (implantable cardioverter-defibrillator) in place   . GERD (gastroesophageal reflux disease)   . Diabetes mellitus with diabetic neuropathy, with long-term current use of insulin (HCC)     Current Outpatient Medications:  .  allopurinol (ZYLOPRIM) 100 MG tablet, Take 1 tablet (100 mg total) by mouth daily., Disp: 30 tablet, Rfl: 3 .  aspirin EC 81 MG EC tablet, Take 1 tablet (81 mg total) by mouth daily., Disp: 90 tablet, Rfl: 0 .  atorvastatin (LIPITOR) 40 MG tablet, TAKE 1 TABLET(40 MG) BY MOUTH DAILY AT 6 PM, Disp: 90 tablet, Rfl: 3 .  bisoprolol (ZEBETA) 5 MG tablet, Take 0.5 tablets (2.5 mg total) by mouth daily., Disp: 15 tablet, Rfl: 11 .  BREO ELLIPTA 100-25 MCG/INH AEPB, Inhale 1 puff into the lungs daily., Disp: , Rfl: 5 .  digoxin (LANOXIN) 0.125 MG tablet, TAKE 1 TABLET(0.125 MG) BY MOUTH DAILY, Disp: 30 tablet, Rfl: 3 .  DULoxetine (CYMBALTA) 60 MG capsule, Take 60 mg by mouth daily., Disp: , Rfl: 2 .  empagliflozin (JARDIANCE) 10 MG TABS tablet, Take 10 mg by mouth daily. , Disp: , Rfl:  .  eplerenone (INSPRA) 25 MG tablet, Take 1 tablet (25 mg total) by mouth daily., Disp: 30 tablet, Rfl: 6 .  hydrALAZINE (APRESOLINE) 25 MG tablet, Take 1 tablet (25 mg total) by mouth 3 (three) times daily.,  Disp: 270 tablet, Rfl: 3 .  isosorbide dinitrate (ISORDIL) 20 MG tablet, Take 1 tablet (20 mg total) by mouth 3 (three) times daily., Disp: 90 tablet, Rfl: 10 .  MAGNESIUM-OXIDE 400 (241.3 Mg) MG tablet, Take 400 mg by mouth daily., Disp: , Rfl: 11 .  omeprazole (PRILOSEC) 20 MG capsule, Take 20 mg by mouth daily., Disp: , Rfl: 1 .  potassium chloride SA (K-DUR) 20 MEQ tablet, TAKE 1 TABLET(20 MEQ) BY MOUTH DAILY, Disp: 90 tablet, Rfl: 3 .  pregabalin (LYRICA) 100 MG capsule, Take 1 capsule (100 mg total) by mouth 2 (two) times daily., Disp: 60 capsule, Rfl: 3 .  sacubitril-valsartan (ENTRESTO) 97-103 MG, Take 1 tablet by mouth 2 (two) times daily., Disp: 180 tablet, Rfl: 3 .  torsemide (DEMADEX) 20 MG tablet, Take 1 tablet (20 mg total) by mouth 2 (two) times daily., Disp: 180 tablet, Rfl: 3 .  diphenhydrAMINE (BENADRYL) 25 MG tablet, Take 25 mg by mouth at bedtime as needed., Disp: , Rfl:  .  Insulin Pen Needle (NOVOFINE) 30G X 8 MM MISC, Inject 10 each into the skin as needed., Disp: 100 each, Rfl: 0 .  insulin regular human CONCENTRATED (HUMULIN R U-500 KWIKPEN) 500 UNIT/ML kwikpen, USE AS DIRECTED PER SLIDING SCALE. MAX DAILY DOSE IS 400 UNITS., Disp: , Rfl:  .  ranitidine (ZANTAC) 150 MG tablet, Take 150 mg by mouth 2 (two)  times daily., Disp: , Rfl:  .  Vitamin D, Cholecalciferol, 1000 units CAPS, Take 1,000 Units by mouth daily., Disp: , Rfl: 11 Allergies  Allergen Reactions  . Codeine   . Coconut Oil Itching      Social History   Socioeconomic History  . Marital status: Divorced    Spouse name: Not on file  . Number of children: Not on file  . Years of education: Not on file  . Highest education level: Not on file  Occupational History  . Occupation: disability    Comment: stopped working in 2000 d/t occupational exposures  Social Needs  . Financial resource strain: Not on file  . Food insecurity    Worry: Not on file    Inability: Not on file  . Transportation needs     Medical: Not on file    Non-medical: Not on file  Tobacco Use  . Smoking status: Former Smoker    Types: Cigarettes  . Smokeless tobacco: Never Used  . Tobacco comment: "smoked when I drank"  Substance and Sexual Activity  . 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 Drug use: Never  . Sexual activity: Not Currently  Lifestyle  . Physical activity    Days per week: Not on file    Minutes per session: Not on file  . Stress: Not on file  Relationships  . Social Musician on phone: Not on file    Gets together: Not on file    Attends religious service: Not on file    Active member of club or organization: Not on file    Attends meetings of clubs or organizations: Not on file    Relationship status: Not on file  . Intimate partner violence    Fear of current or ex partner: Not on file    Emotionally abused: Not on file    Physically abused: Not on file    Forced sexual activity: Not on file  Other Topics Concern  . Not on file  Social History Narrative   Patient lives at home with wife and some of his 10 children. He self-administers his own medications.    Physical Exam Cardiovascular:     Rate and Rhythm: Normal rate and regular rhythm.     Pulses: Normal pulses.  Pulmonary:     Effort: Pulmonary effort is normal.     Breath sounds: Normal breath sounds.  Abdominal:     General: There is distension.  Musculoskeletal: Normal range of motion.     Right lower leg: No edema.     Left lower leg: No edema.  Skin:    General: Skin is warm and dry.     Capillary Refill: Capillary refill takes less than 2 seconds.  Neurological:     Mental Status: He is alert and oriented to person, place, and time.  Psychiatric:        Mood and Affect: Mood normal.         Future Appointments  Date Time Provider Department Center  02/14/2019  9:15 AM MC ECHO OP 2 [facility]  02/14/2019 10:30 AM [facility] PA/NP  [facility] None  03/04/2019  8:10 AM [facility] DEVICE REMOTES [facility]  06/03/2019  8:10 AM [facility] DEVICE REMOTES [facility]  09/02/2019  8:10 AM [facility] DEVICE REMOTES [facility]  12/02/2019  8:10 AM [facility] DEVICE REMOTES [facility]  03/02/2020  8:10 AM [facility]  DEVICE REMOTES [facility]  06/01/2020  8:10 AM [facility] DEVICE REMOTES [facility]    BP 114/87 (BP Location: Left Arm, Patient Position: Sitting, Cuff Size: Normal)   Pulse 80   Resp 16   Wt 262 lb (118.8 kg)   SpO2 94%   BMI 30.28 kg/m   Weight yesterday- 261 lb Last visit weight- 262 lb  Richard [name] was seen at home today and reported feeling unwell. He denied chest pain, SOB, headache, dizziness, orthopnea, cough or fever since our last visit. His complaint today was nausea and diarrhea for the past three days. He reported this occurred because he took tow morning pill slots on the same day because he forgot that he had already taken them. As a result he skipped all remaining doses in his pillbox because he "didn't feel like it." I stressed the importance of taking his medications and he said he understood and would get back on track this week. This continues to be a pattern for Richard [name] despite multiple attempts to educate him on the importance of medication compliance. His medications were verified and his pillbox was refilled. I will follow up next week.   Jacquiline Doe, EMT 01/22/19  ACTION: Home visit completed Next visit planned for 1 week

## 2019-01-27 ENCOUNTER — Encounter (HOSPITAL_COMMUNITY): Payer: Medicare Other

## 2019-01-28 ENCOUNTER — Telehealth (HOSPITAL_COMMUNITY): Payer: Self-pay

## 2019-01-28 NOTE — Telephone Encounter (Signed)
I called Richard [name] to schedule an appointment. He did not answer so I left a message requesting he call me back

## 2019-01-29 ENCOUNTER — Telehealth (HOSPITAL_COMMUNITY): Payer: Self-pay

## 2019-01-29 NOTE — Telephone Encounter (Signed)
I called Mr Word to see if he was available for an appointment today. He did not answer so I left a message requesting he call me back.

## 2019-02-04 ENCOUNTER — Telehealth (HOSPITAL_COMMUNITY): Payer: Self-pay

## 2019-02-04 NOTE — Telephone Encounter (Signed)
I called Richard [name] to schedule an appointment. He did not answer so I left a voicemail requesting he call me back.  

## 2019-02-05 ENCOUNTER — Telehealth (HOSPITAL_COMMUNITY): Payer: Self-pay

## 2019-02-06 ENCOUNTER — Telehealth (HOSPITAL_COMMUNITY): Payer: Self-pay

## 2019-02-06 NOTE — Telephone Encounter (Signed)
I called Richard [name] to schedule an appointment. He did not answer so I left a voicemail requesting he call me back.

## 2019-02-06 NOTE — Telephone Encounter (Signed)
Mr Richard [name] returned my call this afternoon and stated he has ben doing well. He stated he would be available tomorrow after a doctor's appointment so we agreed to meet at 12:00.

## 2019-02-06 NOTE — Telephone Encounter (Signed)
I called Richard [name] to schedule an appointment. He did not answer so I left a message requesting he call me back 

## 2019-02-07 ENCOUNTER — Other Ambulatory Visit (HOSPITAL_COMMUNITY): Payer: Self-pay

## 2019-02-07 NOTE — Progress Notes (Signed)
Paramedicine Encounter    Patient ID: Richard [name], male    DOB: July 02, 1960, [age] y.o.   MRN: [id number]   Patient Care Team: Ananias PilgrimAsres, Alehegn, MD as PCP - General (Family Medicine) Laurey MoraleMcLean, Dalton S, MD as PCP - Advanced Heart Failure (Cardiology)  Patient Active Problem List   Diagnosis Date Noted  . Acute on chronic combined systolic and diastolic CHF (congestive heart failure) (HCC) 03/15/2018  . S/P right and left heart catheterization 11/02/2017  . Panic attacks 11/02/2017  . Non-STEMI (non-ST elevated myocardial infarction) (HCC)   . COPD (chronic obstructive pulmonary disease) (HCC)   . Hypertension   . CKD (chronic kidney disease)   . 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HCC)   . ICD (implantable cardioverter-defibrillator) in place   . GERD (gastroesophageal reflux disease)   . Diabetes mellitus with diabetic neuropathy, with long-term current use of insulin (HCC)     Current Outpatient Medications:  .  allopurinol (ZYLOPRIM) 100 MG tablet, Take 1 tablet (100 mg total) by mouth daily., Disp: 30 tablet, Rfl: 3 .  aspirin EC 81 MG EC tablet, Take 1 tablet (81 mg total) by mouth daily., Disp: 90 tablet, Rfl: 0 .  atorvastatin (LIPITOR) 40 MG tablet, TAKE 1 TABLET(40 MG) BY MOUTH DAILY AT 6 PM, Disp: 90 tablet, Rfl: 3 .  bisoprolol (ZEBETA) 5 MG tablet, Take 0.5 tablets (2.5 mg total) by mouth daily., Disp: 15 tablet, Rfl: 11 .  BREO ELLIPTA 100-25 MCG/INH AEPB, Inhale 1 puff into the lungs daily., Disp: , Rfl: 5 .  digoxin (LANOXIN) 0.125 MG tablet, TAKE 1 TABLET(0.125 MG) BY MOUTH DAILY, Disp: 30 tablet, Rfl: 3 .  diphenhydrAMINE (BENADRYL) 25 MG tablet, Take 25 mg by mouth at bedtime as needed., Disp: , Rfl:  .  DULoxetine (CYMBALTA) 60 MG capsule, Take 60 mg by mouth daily., Disp: , Rfl: 2 .  empagliflozin (JARDIANCE) 10 MG TABS tablet, Take 10 mg by mouth daily. , Disp: , Rfl:  .  eplerenone (INSPRA) 25 MG tablet, Take 1 tablet (25 mg total) by mouth daily., Disp: 30 tablet, Rfl: 6 .   hydrALAZINE (APRESOLINE) 25 MG tablet, Take 1 tablet (25 mg total) by mouth 3 (three) times daily., Disp: 270 tablet, Rfl: 3 .  Insulin Pen Needle (NOVOFINE) 30G X 8 MM MISC, Inject 10 each into the skin as needed., Disp: 100 each, Rfl: 0 .  insulin regular human CONCENTRATED (HUMULIN R U-500 KWIKPEN) 500 UNIT/ML kwikpen, USE AS DIRECTED PER SLIDING SCALE. MAX DAILY DOSE IS 400 UNITS., Disp: , Rfl:  .  isosorbide dinitrate (ISORDIL) 20 MG tablet, Take 1 tablet (20 mg total) by mouth 3 (three) times daily., Disp: 90 tablet, Rfl: 10 .  MAGNESIUM-OXIDE 400 (241.3 Mg) MG tablet, Take 400 mg by mouth daily., Disp: , Rfl: 11 .  omeprazole (PRILOSEC) 20 MG capsule, Take 20 mg by mouth daily., Disp: , Rfl: 1 .  potassium chloride SA (K-DUR) 20 MEQ tablet, TAKE 1 TABLET(20 MEQ) BY MOUTH DAILY, Disp: 90 tablet, Rfl: 3 .  pregabalin (LYRICA) 100 MG capsule, Take 1 capsule (100 mg total) by mouth 2 (two) times daily., Disp: 60 capsule, Rfl: 3 .  ranitidine (ZANTAC) 150 MG tablet, Take 150 mg by mouth 2 (two) times daily., Disp: , Rfl:  .  sacubitril-valsartan (ENTRESTO) 97-103 MG, Take 1 tablet by mouth 2 (two) times daily., Disp: 180 tablet, Rfl: 3 .  torsemide (DEMADEX) 20 MG tablet, Take 1 tablet (20 mg total) by mouth 2 (two) times  daily., Disp: 180 tablet, Rfl: 3 .  Vitamin D, Cholecalciferol, 1000 units CAPS, Take 1,000 Units by mouth daily., Disp: , Rfl: 11 Allergies  Allergen Reactions  . Codeine   . Coconut Oil Itching      Social History   Socioeconomic History  . Marital status: Divorced    Spouse name: Not on file  . Number of children: Not on file  . Years of education: Not on file  . Highest education level: Not on file  Occupational History  . Occupation: disability    Comment: stopped working in 2000 d/t occupational exposures  Social Needs  . Financial resource strain: Not on file  . Food insecurity    Worry: Not on file    Inability: Not on file  . Transportation needs     Medical: Not on file    Non-medical: Not on file  Tobacco Use  . Smoking status: Former Smoker    Types: Cigarettes  . Smokeless tobacco: Never Used  . Tobacco comment: "smoked when I drank"  Substance and Sexual Activity  . 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 Drug use: Never  . Sexual activity: Not Currently  Lifestyle  . Physical activity    Days per week: Not on file    Minutes per session: Not on file  . Stress: Not on file  Relationships  . Social Herbalist on phone: Not on file    Gets together: Not on file    Attends religious service: Not on file    Active member of club or organization: Not on file    Attends meetings of clubs or organizations: Not on file    Relationship status: Not on file  . Intimate partner violence    Fear of current or ex partner: Not on file    Emotionally abused: Not on file    Physically abused: Not on file    Forced sexual activity: Not on file  Other Topics Concern  . Not on file  Social History Narrative   Patient lives at home with wife and some of his 10 children. He self-administers his own medications.    Physical Exam Cardiovascular:     Rate and Rhythm: Normal rate and regular rhythm.     Pulses: Normal pulses.  Pulmonary:     Effort: Pulmonary effort is normal.     Breath sounds: Normal breath sounds.  Abdominal:     General: There is distension.  Musculoskeletal: Normal range of motion.     Right lower leg: Edema present.     Left lower leg: Edema present.  Skin:    General: Skin is warm and dry.     Capillary Refill: Capillary refill takes less than 2 seconds.  Neurological:     Mental Status: He is alert and oriented to person, place, and time.  Psychiatric:        Mood and Affect: Mood normal.         Future Appointments  Date Time Provider [place]  02/14/2019  9:15 AM [facility] ECHO OP 2 [facility]  02/14/2019 10:30 AM [facility]  PA/NP [facility] None  03/04/2019  8:10 AM [facility] DEVICE REMOTES [facility]  06/03/2019  8:10 AM [facility] DEVICE REMOTES [facility]  09/02/2019  8:10 AM [facility] DEVICE REMOTES [facility]  12/02/2019  8:10 AM [facility] DEVICE REMOTES [facility]  03/02/2020  8:10 AM [facility]  DEVICE REMOTES [facility]  06/01/2020  8:10 AM [facility] DEVICE REMOTES [facility]    BP 106/70 (BP Location: Left Arm, Patient Position: Sitting, Cuff Size: Normal)   Pulse 76   Resp 18   Wt 261 lb (118.4 kg)   SpO2 93%   BMI 30.16 kg/m   Weight yesterday- Did not weigh Last visit weight- 262 lb  Mr Basra was seen at home today and reported feeling well. He denied chest pain, SOB, headache, dizziness, orthopnea, fever or cough since our last visit. He reported being compliant with his medications over the past two weeks and his weight has been stable. He said he had been very busy lately which is why he had not been answering or returning my phone calls. His medications were verified but he did not have omeprazole or entresto but I contacted the pharmacy and they advised they would get it ready for him today. I will follow up next week.   [name], EMT 02/07/19  ACTION: Home visit completed Next visit planned for 1 week

## 2019-02-12 ENCOUNTER — Other Ambulatory Visit (HOSPITAL_COMMUNITY): Payer: Self-pay

## 2019-02-12 ENCOUNTER — Other Ambulatory Visit (HOSPITAL_COMMUNITY): Payer: Self-pay | Admitting: *Deleted

## 2019-02-12 MED ORDER — DIGOXIN 125 MCG PO TABS: ORAL_TABLET | ORAL | 3 refills | Status: DC

## 2019-02-12 NOTE — Progress Notes (Signed)
Paramedicine Encounter    Patient ID: Richard [name], male    DOB: [birth year], 58 y.o.   MRN: [id number]   Patient Care Team: Wallene Dales, MD as PCP - General (Family Medicine) Larey Dresser, MD as PCP - Advanced Heart Failure (Cardiology)  Patient Active Problem List   Diagnosis Date Noted  . Acute on chronic combined systolic and diastolic CHF (congestive heart failure) ([place]) 03/15/2018  . S/P right and left heart catheterization 11/02/2017  . Panic attacks 11/02/2017  . Non-STEMI (non-ST elevated myocardial infarction) ([place])   . COPD (chronic obstructive pulmonary disease) ([place])   . Hypertension   . CKD (chronic kidney disease)   . 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[place])   . ICD (implantable cardioverter-defibrillator) in place   . GERD (gastroesophageal reflux disease)   . Diabetes mellitus with diabetic neuropathy, with long-term current use of insulin (HCC)     Current Outpatient Medications:  .  allopurinol (ZYLOPRIM) 100 MG tablet, Take 1 tablet (100 mg total) by mouth daily., Disp: 30 tablet, Rfl: 3 .  aspirin EC 81 MG EC tablet, Take 1 tablet (81 mg total) by mouth daily., Disp: 90 tablet, Rfl: 0 .  atorvastatin (LIPITOR) 40 MG tablet, TAKE 1 TABLET(40 MG) BY MOUTH DAILY AT 6 PM, Disp: 90 tablet, Rfl: 3 .  bisoprolol (ZEBETA) 5 MG tablet, Take 0.5 tablets (2.5 mg total) by mouth daily., Disp: 15 tablet, Rfl: 11 .  BREO ELLIPTA 100-25 MCG/INH AEPB, Inhale 1 puff into the lungs daily., Disp: , Rfl: 5 .  digoxin (LANOXIN) 0.125 MG tablet, TAKE 1 TABLET(0.125 MG) BY MOUTH DAILY, Disp: 30 tablet, Rfl: 3 .  diphenhydrAMINE (BENADRYL) 25 MG tablet, Take 25 mg by mouth at bedtime as needed., Disp: , Rfl:  .  DULoxetine (CYMBALTA) 60 MG capsule, Take 60 mg by mouth daily., Disp: , Rfl: 2 .  empagliflozin (JARDIANCE) 10 MG TABS tablet, Take 10 mg by mouth daily. , Disp: , Rfl:  .  eplerenone (INSPRA) 25 MG tablet, Take 1 tablet (25 mg total) by mouth daily., Disp: 30 tablet, Rfl: 6 .   hydrALAZINE (APRESOLINE) 25 MG tablet, Take 1 tablet (25 mg total) by mouth 3 (three) times daily., Disp: 270 tablet, Rfl: 3 .  isosorbide dinitrate (ISORDIL) 20 MG tablet, Take 1 tablet (20 mg total) by mouth 3 (three) times daily., Disp: 90 tablet, Rfl: 10 .  MAGNESIUM-OXIDE 400 (241.3 Mg) MG tablet, Take 400 mg by mouth daily., Disp: , Rfl: 11 .  omeprazole (PRILOSEC) 20 MG capsule, Take 20 mg by mouth daily., Disp: , Rfl: 1 .  potassium chloride SA (K-DUR) 20 MEQ tablet, TAKE 1 TABLET(20 MEQ) BY MOUTH DAILY, Disp: 90 tablet, Rfl: 3 .  pregabalin (LYRICA) 100 MG capsule, Take 1 capsule (100 mg total) by mouth 2 (two) times daily., Disp: 60 capsule, Rfl: 3 .  sacubitril-valsartan (ENTRESTO) 97-103 MG, Take 1 tablet by mouth 2 (two) times daily., Disp: 180 tablet, Rfl: 3 .  torsemide (DEMADEX) 20 MG tablet, Take 1 tablet (20 mg total) by mouth 2 (two) times daily., Disp: 180 tablet, Rfl: 3 .  Insulin Pen Needle (NOVOFINE) 30G X 8 MM MISC, Inject 10 each into the skin as needed., Disp: 100 each, Rfl: 0 .  insulin regular human CONCENTRATED (HUMULIN R U-500 KWIKPEN) 500 UNIT/ML kwikpen, USE AS DIRECTED PER SLIDING SCALE. MAX DAILY DOSE IS 400 UNITS., Disp: , Rfl:  .  ranitidine (ZANTAC) 150 MG tablet, Take 150 mg by mouth 2 (two)  times daily., Disp: , Rfl:  .  Vitamin D, Cholecalciferol, 1000 units CAPS, Take 1,000 Units by mouth daily., Disp: , Rfl: 11 Allergies  Allergen Reactions  . Codeine   . Coconut Oil Itching      Social History   Socioeconomic History  . Marital status: Divorced    Spouse name: Not on file  . Number of children: Not on file  . Years of education: Not on file  . Highest education level: Not on file  Occupational History  . Occupation: disability    Comment: stopped working in 2000 d/t occupational exposures  Social Needs  . Financial resource strain: Not on file  . Food insecurity    Worry: Not on file    Inability: Not on file  . Transportation needs     Medical: Not on file    Non-medical: Not on file  Tobacco Use  . Smoking status: Former Smoker    Types: Cigarettes  . Smokeless tobacco: Never Used  . Tobacco comment: "smoked when I drank"  Substance and Sexual Activity  . 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 Drug use: Never  . Sexual activity: Not Currently  Lifestyle  . Physical activity    Days per week: Not on file    Minutes per session: Not on file  . Stress: Not on file  Relationships  . Social Musicianconnections    Talks on phone: Not on file    Gets together: Not on file    Attends religious service: Not on file    Active member of club or organization: Not on file    Attends meetings of clubs or organizations: Not on file    Relationship status: Not on file  . Intimate partner violence    Fear of current or ex partner: Not on file    Emotionally abused: Not on file    Physically abused: Not on file    Forced sexual activity: Not on file  Other Topics Concern  . Not on file  Social History Narrative   Patient lives at home with wife and some of his 10 children. He self-administers his own medications.    Physical Exam Cardiovascular:     Rate and Rhythm: Normal rate and regular rhythm.     Pulses: Normal pulses.  Pulmonary:     Effort: Pulmonary effort is normal.     Breath sounds: Normal breath sounds.  Abdominal:     General: There is distension.  Musculoskeletal: Normal range of motion.     Right lower leg: Edema present.     Left lower leg: Edema present.  Skin:    General: Skin is warm and dry.     Capillary Refill: Capillary refill takes less than 2 seconds.  Neurological:     Mental Status: He is alert and oriented to person, place, and time.  Psychiatric:        Mood and Affect: Mood normal.         Future Appointments  Date Time Provider Department [facility]  02/14/2019  9:15 AM [facility]  02/14/2019 10:30 AM [facility]  PA/NP [facility] None  03/04/2019  8:10 AM [facility] DEVICE REMOTES [facility]  06/03/2019  8:10 AM [facility] DEVICE REMOTES [facility]  09/02/2019  8:10 AM [facility] DEVICE REMOTES [facility]  12/02/2019  8:10 AM [facility] DEVICE REMOTES [facility]  03/02/2020  8:10 AM [facility]  DEVICE REMOTES [facility]  06/01/2020  8:10 AM [facility] DEVICE REMOTES [facility]    BP (!) 100/59 (BP Location: Left Arm, Patient Position: Sitting, Cuff Size: Normal)   Pulse 80   Resp 18   Wt 258 lb 14.4 oz (117.4 kg)   SpO2 94%   BMI 29.92 kg/m   Weight yesterday- 260 lb Last visit weight- 261 lb  Mr Parrill was seen at home today and reported feeling unwell. He denied chest pain, headache, dizziness, orthopnea, cough or fever. His primary complaint was abdominal distension from constipation. He stated he has been taking stool softeners but they have not been effective. I suggested he speak to his PCP about being referred to a GI specialist and he was agreeable. He remains semi-compliant with his medications, primarily missing his afternoon and evening doses of medications. He blames this on others in the house being a distraction. We will continue to work on this as a key part of his program. His medications were verified and his pillbox was refilled. I will follow up next week.   [name], EMT 02/12/19  ACTION: Home visit completed Next visit planned for 1 week

## 2019-02-14 ENCOUNTER — Encounter (HOSPITAL_COMMUNITY): Payer: Medicare Other

## 2019-02-14 ENCOUNTER — Ambulatory Visit (HOSPITAL_COMMUNITY): Admission: RE | Admit: 2019-02-14 | Payer: Medicare Other | Source: Ambulatory Visit

## 2019-02-14 ENCOUNTER — Encounter (HOSPITAL_COMMUNITY): Payer: Self-pay

## 2019-02-14 ENCOUNTER — Other Ambulatory Visit (HOSPITAL_COMMUNITY): Payer: Self-pay | Admitting: Cardiology

## 2019-02-19 ENCOUNTER — Telehealth (HOSPITAL_COMMUNITY): Payer: Self-pay

## 2019-02-21 ENCOUNTER — Telehealth (HOSPITAL_COMMUNITY): Payer: Self-pay

## 2019-02-21 NOTE — Telephone Encounter (Signed)
Richard [name] returned my call this afternoon and reported feeling well. He stated he had already filled his pillbox and did not need me to come out today. I asked it he had rescheduled his echo yet and he had not so I advised I would have the HF clinic reach out to him and told him to be sure he answered when they call. He was understanding and agreeable.

## 2019-02-21 NOTE — Telephone Encounter (Signed)
I called Mr Chawla to schedule an appointment. He did not answer so I left a message requesting he call me back 

## 2019-02-21 NOTE — Telephone Encounter (Signed)
I called Mr Richard [name] to schedule an appointment. He did not answer so I left a voicemail requesting he call me back.  

## 2019-02-21 NOTE — Telephone Encounter (Signed)
Received a call from [place] with paramedicine advising that Richard [name] would like [place] to contact him to reschedule as his wife is available as she is the one who handles scheduling appts. After speaking with [name], I attempted to reach out to the PT using the number on record and received no answer/no voicemail. Was unable to successfully contact the PT/Wife in order to reschedule the Echo & Provider visit that he recently missed.  -[facility]

## 2019-02-26 ENCOUNTER — Telehealth (HOSPITAL_COMMUNITY): Payer: Self-pay

## 2019-02-27 NOTE — Telephone Encounter (Signed)
I called Richard [name] to schedule an appointment. He did not answer so I left a voicemail requesting he call me back.  

## 2019-03-04 ENCOUNTER — Ambulatory Visit (INDEPENDENT_AMBULATORY_CARE_PROVIDER_SITE_OTHER): Payer: Medicare Other | Admitting: *Deleted

## 2019-03-04 DIAGNOSIS — I428 Other cardiomyopathies: Secondary | ICD-10-CM | POA: Diagnosis not present

## 2019-03-04 LAB — CUP PACEART REMOTE DEVICE CHECK
Battery Remaining Longevity: 111 mo
Battery Voltage: 3 V
Brady Statistic AP VP Percent: 0 %
Brady Statistic AP VS Percent: 0.23 %
Brady Statistic AS VP Percent: 0.04 %
Brady Statistic AS VS Percent: 99.73 %
Brady Statistic RA Percent Paced: 0.23 %
Brady Statistic RV Percent Paced: 0.04 %
Date Time Interrogation Session: 20200908062825
HighPow Impedance: 74 Ohm
Implantable Lead Implant Date: 20180719
Implantable Lead Implant Date: 20180719
Implantable Lead Location: 753859
Implantable Lead Location: 753860
Implantable Lead Model: 5076
Implantable Pulse Generator Implant Date: 20180719
Lead Channel Impedance Value: 399 Ohm
Lead Channel Impedance Value: 456 Ohm
Lead Channel Impedance Value: 456 Ohm
Lead Channel Pacing Threshold Amplitude: 0.625 V
Lead Channel Pacing Threshold Amplitude: 0.875 V
Lead Channel Pacing Threshold Pulse Width: 0.4 ms
Lead Channel Pacing Threshold Pulse Width: 0.4 ms
Lead Channel Sensing Intrinsic Amplitude: 10.25 mV
Lead Channel Sensing Intrinsic Amplitude: 10.25 mV
Lead Channel Sensing Intrinsic Amplitude: 3.875 mV
Lead Channel Sensing Intrinsic Amplitude: 3.875 mV
Lead Channel Setting Pacing Amplitude: 1.75 V
Lead Channel Setting Pacing Amplitude: 2 V
Lead Channel Setting Pacing Pulse Width: 0.4 ms
Lead Channel Setting Sensing Sensitivity: 0.3 mV

## 2019-03-05 ENCOUNTER — Telehealth (HOSPITAL_COMMUNITY): Payer: Self-pay | Admitting: *Deleted

## 2019-03-05 ENCOUNTER — Other Ambulatory Visit (HOSPITAL_COMMUNITY): Payer: Self-pay

## 2019-03-05 NOTE — Progress Notes (Signed)
Paramedicine Encounter    Patient ID: Nechama GuardRickey Cangemi, male    DOB: 1960-12-11, 58 y.o.   MRN: [id number]   Patient Care Team: Ananias PilgrimAsres, Alehegn, MD as PCP - General (Family Medicine) Laurey MoraleMcLean, Dalton S, MD as PCP - Advanced Heart Failure (Cardiology)  Patient Active Problem List   Diagnosis Date Noted  . Acute on chronic combined systolic and diastolic CHF (congestive heart failure) (HCC) 03/15/2018  . S/P right and left heart catheterization 11/02/2017  . Panic attacks 11/02/2017  . Non-STEMI (non-ST elevated myocardial infarction) (HCC)   . COPD (chronic obstructive pulmonary disease) (HCC)   . Hypertension   . CKD (chronic kidney disease)   . 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HCC)   . ICD (implantable cardioverter-defibrillator) in place   . GERD (gastroesophageal reflux disease)   . Diabetes mellitus with diabetic neuropathy, with long-term current use of insulin (HCC)     Current Outpatient Medications:  .  allopurinol (ZYLOPRIM) 100 MG tablet, Take 1 tablet (100 mg total) by mouth daily., Disp: 30 tablet, Rfl: 3 .  aspirin EC 81 MG EC tablet, Take 1 tablet (81 mg total) by mouth daily., Disp: 90 tablet, Rfl: 0 .  atorvastatin (LIPITOR) 40 MG tablet, TAKE 1 TABLET(40 MG) BY MOUTH DAILY AT 6 PM, Disp: 90 tablet, Rfl: 3 .  bisoprolol (ZEBETA) 5 MG tablet, Take 0.5 tablets (2.5 mg total) by mouth daily., Disp: 15 tablet, Rfl: 11 .  BREO ELLIPTA 100-25 MCG/INH AEPB, Inhale 1 puff into the lungs daily., Disp: , Rfl: 5 .  digoxin (LANOXIN) 0.125 MG tablet, TAKE 1 TABLET(0.125 MG) BY MOUTH DAILY, Disp: 30 tablet, Rfl: 3 .  diphenhydrAMINE (BENADRYL) 25 MG tablet, Take 25 mg by mouth at bedtime as needed., Disp: , Rfl:  .  DULoxetine (CYMBALTA) 60 MG capsule, Take 60 mg by mouth daily., Disp: , Rfl: 2 .  empagliflozin (JARDIANCE) 10 MG TABS tablet, Take 10 mg by mouth daily. , Disp: , Rfl:  .  eplerenone (INSPRA) 25 MG tablet, Take 1 tablet (25 mg total) by mouth daily., Disp: 30 tablet, Rfl: 6 .   hydrALAZINE (APRESOLINE) 25 MG tablet, Take 1 tablet (25 mg total) by mouth 3 (three) times daily., Disp: 270 tablet, Rfl: 3 .  Insulin Pen Needle (NOVOFINE) 30G X 8 MM MISC, Inject 10 each into the skin as needed., Disp: 100 each, Rfl: 0 .  insulin regular human CONCENTRATED (HUMULIN R U-500 KWIKPEN) 500 UNIT/ML kwikpen, USE AS DIRECTED PER SLIDING SCALE. MAX DAILY DOSE IS 400 UNITS., Disp: , Rfl:  .  isosorbide dinitrate (ISORDIL) 20 MG tablet, Take 1 tablet (20 mg total) by mouth 3 (three) times daily., Disp: 90 tablet, Rfl: 10 .  MAGNESIUM-OXIDE 400 (241.3 Mg) MG tablet, Take 400 mg by mouth daily., Disp: , Rfl: 11 .  omeprazole (PRILOSEC) 20 MG capsule, Take 20 mg by mouth daily., Disp: , Rfl: 1 .  potassium chloride SA (K-DUR) 20 MEQ tablet, TAKE 1 TABLET(20 MEQ) BY MOUTH DAILY, Disp: 90 tablet, Rfl: 3 .  pregabalin (LYRICA) 100 MG capsule, Take 1 capsule (100 mg total) by mouth 2 (two) times daily., Disp: 60 capsule, Rfl: 3 .  sacubitril-valsartan (ENTRESTO) 97-103 MG, Take 1 tablet by mouth 2 (two) times daily., Disp: 180 tablet, Rfl: 3 .  torsemide (DEMADEX) 20 MG tablet, Take 1 tablet (20 mg total) by mouth 2 (two) times daily., Disp: 180 tablet, Rfl: 3 .  ranitidine (ZANTAC) 150 MG tablet, Take 150 mg by mouth 2 (two)  times daily., Disp: , Rfl:  .  Vitamin D, Cholecalciferol, 1000 units CAPS, Take 1,000 Units by mouth daily., Disp: , Rfl: 11 Allergies  Allergen Reactions  . Codeine   . Coconut Oil Itching      Social History   Socioeconomic History  . Marital status: Divorced    Spouse name: Not on file  . Number of children: Not on file  . Years of education: Not on file  . Highest education level: Not on file  Occupational History  . Occupation: disability    Comment: stopped working in 2000 d/t occupational exposures  Social Needs  . Financial resource strain: Not on file  . Food insecurity    Worry: Not on file    Inability: Not on file  . Transportation needs     Medical: Not on file    Non-medical: Not on file  Tobacco Use  . Smoking status: Former Smoker    Types: Cigarettes  . Smokeless tobacco: Never Used  . Tobacco comment: "smoked when I drank"  Substance and Sexual Activity  . 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 Drug use: Never  . Sexual activity: Not Currently  Lifestyle  . Physical activity    Days per week: Not on file    Minutes per session: Not on file  . Stress: Not on file  Relationships  . Social Herbalist on phone: Not on file    Gets together: Not on file    Attends religious service: Not on file    Active member of club or organization: Not on file    Attends meetings of clubs or organizations: Not on file    Relationship status: Not on file  . Intimate partner violence    Fear of current or ex partner: Not on file    Emotionally abused: Not on file    Physically abused: Not on file    Forced sexual activity: Not on file  Other Topics Concern  . Not on file  Social History Narrative   Patient lives at home with wife and some of his 10 children. He self-administers his own medications.    Physical Exam Cardiovascular:     Rate and Rhythm: Normal rate and regular rhythm.     Pulses: Normal pulses.  Pulmonary:     Effort: Pulmonary effort is normal.     Breath sounds: Normal breath sounds.  Abdominal:     General: There is distension.  Musculoskeletal: Normal range of motion.     Right lower leg: Edema present.     Left lower leg: Edema present.  Skin:    General: Skin is warm and dry.     Capillary Refill: Capillary refill takes less than 2 seconds.  Neurological:     Mental Status: He is alert and oriented to person, place, and time.  Psychiatric:        Mood and Affect: Mood normal.         Future Appointments  Date Time Provider [place]  06/03/2019  8:10 AM [facility] DEVICE REMOTES [facility]   09/02/2019  8:10 AM [facility] DEVICE REMOTES [facility]  12/02/2019  8:10 AM [facility] DEVICE REMOTES [facility]  03/02/2020  8:10 AM [facility] DEVICE REMOTES [facility]  06/01/2020  8:10 AM [facility] DEVICE REMOTES [facility]    BP (!) 88/60 (BP Location: Left Arm, Patient Position: Sitting, Cuff Size:  Normal)   Pulse 82   Resp 18   Wt 264 lb (119.7 kg)   SpO2 93%   BMI 30.51 kg/m   Weight yesterday- 270 lb Last visit weight- 258 lb  Mr Prasse was seen at home today and reported feeling "OK." He stated he has been drinking too much soda lately and his weight had increased. He stated he cut back on fluids and his weight has come down. He denied chest pain, SOB, headache, dizziness, orthopnea, fever or cough since our last visit. I was unable to reach him last week so I am unsure about how he took his medications last week. His medications were verified and his pillbox was refilled. I contacted the clinic about his borderline blood pressure and weight loss and they advised to hold his afternoon torsemide. I made the change to his pillbox and called in the necessary refills. I will return Friday to add the necessary medications once they are picked up.   [name], EMT 03/05/19  ACTION: Home visit completed Next visit planned for Friday

## 2019-03-06 NOTE — Telephone Encounter (Signed)
Late entry zacl called 03/05/19 to report pts weights was down and bp was 88/60. Per Amy hold evening diuretic. [name] aware.

## 2019-03-12 ENCOUNTER — Telehealth (HOSPITAL_COMMUNITY): Payer: Self-pay

## 2019-03-12 NOTE — Telephone Encounter (Signed)
I called Richard [name] to schedule an appointment for today. He did not answer so I left a message requesting he call me back.

## 2019-03-14 ENCOUNTER — Telehealth (HOSPITAL_COMMUNITY): Payer: Self-pay

## 2019-03-14 ENCOUNTER — Other Ambulatory Visit (HOSPITAL_COMMUNITY): Payer: Self-pay

## 2019-03-14 NOTE — Progress Notes (Signed)
I arrived to Richard [name] house for our scheduled appointment. Per the nephew at the house, Richard [name] had left with his wife some time ago and they did not know when he would be back. I waited a few minutes to see if he would be back while I was there but he did not return.   Jacquiline Doe, EMT 03/14/19

## 2019-03-14 NOTE — Telephone Encounter (Signed)
I called Richard [name] to schedule an appointment. He stated he would be home this afternoon so I suggested we meet at 14:00. He was understanding and agreeable.

## 2019-03-18 ENCOUNTER — Telehealth (HOSPITAL_COMMUNITY): Payer: Self-pay

## 2019-03-18 NOTE — Telephone Encounter (Signed)
I called Mr Duley to schedule an appointment. He did not answer so I left a message requesting he call me back. I added in the message that if he fails to return my call he will be discharged from the program due to continued missed appointments and not being responsive to phone calls. I will pass this along to Tammy Sours, LCSW so she is aware.

## 2019-03-19 ENCOUNTER — Encounter: Payer: Self-pay | Admitting: Cardiology

## 2019-03-19 NOTE — Progress Notes (Signed)
Remote ICD transmission.   

## 2019-03-29 ENCOUNTER — Other Ambulatory Visit (HOSPITAL_COMMUNITY): Payer: Self-pay | Admitting: Cardiology

## 2019-04-22 ENCOUNTER — Other Ambulatory Visit: Payer: Self-pay

## 2019-04-22 ENCOUNTER — Emergency Department (HOSPITAL_BASED_OUTPATIENT_CLINIC_OR_DEPARTMENT_OTHER): Payer: Medicare Other

## 2019-04-22 ENCOUNTER — Encounter (HOSPITAL_BASED_OUTPATIENT_CLINIC_OR_DEPARTMENT_OTHER): Payer: Self-pay | Admitting: Emergency Medicine

## 2019-04-22 ENCOUNTER — Emergency Department (HOSPITAL_BASED_OUTPATIENT_CLINIC_OR_DEPARTMENT_OTHER)
Admission: EM | Admit: 2019-04-22 | Discharge: 2019-04-22 | Disposition: A | Discharge status: Home / Self Care | Payer: Medicare Other | Attending: Emergency Medicine | Admitting: Emergency Medicine

## 2019-04-22 DIAGNOSIS — Z79899 Other long term (current) drug therapy: Secondary | ICD-10-CM | POA: Insufficient documentation

## 2019-04-22 DIAGNOSIS — I5042 Chronic combined systolic (congestive) and diastolic (congestive) heart failure: Secondary | ICD-10-CM | POA: Diagnosis not present

## 2019-04-22 DIAGNOSIS — E1140 Type 2 diabetes mellitus with diabetic neuropathy, unspecified: Secondary | ICD-10-CM | POA: Diagnosis not present

## 2019-04-22 DIAGNOSIS — I130 Hypertensive heart and chronic kidney disease with heart failure and stage 1 through stage 4 chronic kidney disease, or unspecified chronic kidney disease: Secondary | ICD-10-CM | POA: Diagnosis not present

## 2019-04-22 DIAGNOSIS — R0602 Shortness of breath: Secondary | ICD-10-CM

## 2019-04-22 DIAGNOSIS — Z95810 Presence of automatic (implantable) cardiac defibrillator: Secondary | ICD-10-CM | POA: Insufficient documentation

## 2019-04-22 DIAGNOSIS — J449 Chronic obstructive pulmonary disease, unspecified: Secondary | ICD-10-CM | POA: Diagnosis not present

## 2019-04-22 DIAGNOSIS — N189 Chronic kidney disease, unspecified: Secondary | ICD-10-CM | POA: Insufficient documentation

## 2019-04-22 DIAGNOSIS — Z794 Long term (current) use of insulin: Secondary | ICD-10-CM | POA: Insufficient documentation

## 2019-04-22 DIAGNOSIS — E1122 Type 2 diabetes mellitus with diabetic chronic kidney disease: Secondary | ICD-10-CM | POA: Diagnosis not present

## 2019-04-22 DIAGNOSIS — R109 Unspecified abdominal pain: Secondary | ICD-10-CM | POA: Insufficient documentation

## 2019-04-22 DIAGNOSIS — R066 Hiccough: Secondary | ICD-10-CM | POA: Insufficient documentation

## 2019-04-22 DIAGNOSIS — Z87891 Personal history of nicotine dependence: Secondary | ICD-10-CM | POA: Diagnosis not present

## 2019-04-22 LAB — CBC WITH DIFFERENTIAL/PLATELET
Abs Immature Granulocytes: 0.02 10*3/uL (ref 0.00–0.07)
Basophils Absolute: 0 10*3/uL (ref 0.0–0.1)
Basophils Relative: 1 %
Eosinophils Absolute: 0 10*3/uL (ref 0.0–0.5)
Eosinophils Relative: 1 %
HCT: 64 % — ABNORMAL HIGH (ref 39.0–52.0)
Hemoglobin: 20.8 g/dL — ABNORMAL HIGH (ref 13.0–17.0)
Immature Granulocytes: 0 %
Lymphocytes Relative: 23 %
Lymphs Abs: 1.7 10*3/uL (ref 0.7–4.0)
MCH: 30.8 pg (ref 26.0–34.0)
MCHC: 32.5 g/dL (ref 30.0–36.0)
MCV: 94.7 fL (ref 80.0–100.0)
Monocytes Absolute: 0.8 10*3/uL (ref 0.1–1.0)
Monocytes Relative: 11 %
Neutro Abs: 4.7 10*3/uL (ref 1.7–7.7)
Neutrophils Relative %: 64 %
Platelets: 228 10*3/uL (ref 150–400)
RBC: 6.76 MIL/uL — ABNORMAL HIGH (ref 4.22–5.81)
RDW: 15.9 % — ABNORMAL HIGH (ref 11.5–15.5)
WBC: 7.3 10*3/uL (ref 4.0–10.5)
nRBC: 0 % (ref 0.0–0.2)

## 2019-04-22 LAB — TROPONIN I (HIGH SENSITIVITY)
Troponin I (High Sensitivity): 31 ng/L — ABNORMAL HIGH (ref <18)
Troponin I (High Sensitivity): 25 ng/L — ABNORMAL HIGH (ref <18)

## 2019-04-22 LAB — URINALYSIS, ROUTINE W REFLEX MICROSCOPIC
Glucose, UA: 100 mg/dL — AB
Hgb urine dipstick: NEGATIVE
Ketones, ur: NEGATIVE mg/dL
Leukocytes,Ua: NEGATIVE
Nitrite: NEGATIVE
Protein, ur: 100 mg/dL — AB
Specific Gravity, Urine: >1.03 — ABNORMAL HIGH (ref 1.005–1.030)
pH: 5.5 (ref 5.0–8.0)

## 2019-04-22 LAB — URINALYSIS, MICROSCOPIC (REFLEX)

## 2019-04-22 LAB — COMPREHENSIVE METABOLIC PANEL
ALT: 25 U/L (ref 0–44)
AST: 22 U/L (ref 15–41)
Albumin: 3.9 g/dL (ref 3.5–5.0)
Alkaline Phosphatase: 73 U/L (ref 38–126)
Anion gap: 12 (ref 5–15)
BUN: 25 mg/dL — ABNORMAL HIGH (ref 6–20)
CO2: 33 mmol/L — ABNORMAL HIGH (ref 22–32)
Calcium: 9 mg/dL (ref 8.9–10.3)
Chloride: 93 mmol/L — ABNORMAL LOW (ref 98–111)
Creatinine, Ser: 1.31 mg/dL — ABNORMAL HIGH (ref 0.61–1.24)
GFR calc Af Amer: >60 mL/min (ref >60)
GFR calc non Af Amer: >60 mL/min (ref >60)
Glucose, Bld: 208 mg/dL — ABNORMAL HIGH (ref 70–99)
Potassium: 3.8 mmol/L (ref 3.5–5.1)
Sodium: 138 mmol/L (ref 135–145)
Total Bilirubin: 2.5 mg/dL — ABNORMAL HIGH (ref 0.3–1.2)
Total Protein: 7.4 g/dL (ref 6.5–8.1)

## 2019-04-22 LAB — LIPASE, BLOOD: Lipase: 15 U/L (ref 11–51)

## 2019-04-22 LAB — BRAIN NATRIURETIC PEPTIDE: B Natriuretic Peptide: 160.1 pg/mL — ABNORMAL HIGH (ref 0.0–100.0)

## 2019-04-22 MED ORDER — BACLOFEN 10 MG PO TABS: 10.0000 mg | ORAL_TABLET | Freq: Three times a day (TID) | ORAL | 0 refills | Status: DC

## 2019-04-22 MED ORDER — SODIUM CHLORIDE 0.9 % IV BOLUS
500.0000 mL | Freq: Once | INTRAVENOUS | Status: AC
  Administered 2019-04-22: 500 mL via INTRAVENOUS

## 2019-04-22 MED ORDER — ONDANSETRON HCL 4 MG/2ML IJ SOLN
4.0000 mg | Freq: Once | INTRAMUSCULAR | Status: AC
  Administered 2019-04-22: 4 mg via INTRAVENOUS
  Filled 2019-04-22: qty 2

## 2019-04-22 MED ORDER — ALUM & MAG HYDROXIDE-SIMETH 200-200-20 MG/5ML PO SUSP
30.0000 mL | Freq: Once | ORAL | Status: AC
  Administered 2019-04-22: 30 mL via ORAL
  Filled 2019-04-22: qty 30

## 2019-04-22 MED ORDER — BACLOFEN 10 MG PO TABS
10.0000 mg | ORAL_TABLET | Freq: Once | ORAL | Status: DC
  Filled 2019-04-22: qty 1

## 2019-04-22 MED ORDER — LIDOCAINE VISCOUS HCL 2 % MT SOLN
15.0000 mL | Freq: Once | OROMUCOSAL | Status: AC
  Administered 2019-04-22: 15 mL via ORAL
  Filled 2019-04-22: qty 15

## 2019-04-22 MED ORDER — ALBUTEROL SULFATE HFA 108 (90 BASE) MCG/ACT IN AERS
8.0000 | INHALATION_SPRAY | Freq: Once | RESPIRATORY_TRACT | Status: AC
  Administered 2019-04-22: 8 via RESPIRATORY_TRACT
  Filled 2019-04-22: qty 6.7

## 2019-04-22 MED ORDER — GABAPENTIN 300 MG PO CAPS
300.0000 mg | ORAL_CAPSULE | Freq: Once | ORAL | Status: AC
  Administered 2019-04-22: 15:00:00 300 mg via ORAL
  Filled 2019-04-22: qty 1

## 2019-04-22 MED FILL — BACLOFEN 10 MG TABS: 10 | 10 days supply | Qty: 30 | Fill #0

## 2019-04-22 NOTE — ED Provider Notes (Signed)
[facility] EMERGENCY DEPARTMENT Provider Note   CSN: [id number] Arrival date & time: 04/22/19  1024     History   Chief Complaint Chief Complaint  Patient presents with  . Shortness of Breath  . Hiccups    HPI Richard [name] is a 58 y.o. male with PMHx CKD, COPD on 2L O2 at home, Diabetes, GERD, HTN, anxiety, AICD, CHF with EF 20-25% who presents to the ED today initially complaining of hiccups.  She reports that he has had intermittent hiccups for the past 2 days.  He states that the hiccups will resolve after he drinks a few sips of water.  Hours later the hiccups will return.  States that he has been unable to sleep at night due to the hiccups waking him up from his sleep.  He also complains of left upper quadrant/epigastric abdominal pain for the last 3 to 4 days.  Patient states that he feels like something is coming up from his stomach but stays in his throat.  No emesis. He states that this causes him to feel very short of breath and feels like he cannot get a deep breath in.  He states that he has to gasp for air at this point which prompted the hiccups.  He states he has been taking Pepto-Bismol with some relief of the left upper quadrant pain.  Patient does endorse that he had not had any alcohol in 2 years but did have 1 shot with his son for his birthday 6 days ago.  Is concerned that this could have caused his hiccups.  States that he only feels short of breath when his hiccups are present but states that he feels short of breath at rest for the past 3 to 4 days.  Patient is poor historian.  Denies fever, chills, chest pain, emesis, diarrhea, constipation, urinary symptoms, any other associated symptoms.  No recent antibiotic use. No suspicious food intake.        Past Medical History:  Diagnosis Date  . AICD (automatic cardioverter/defibrillator) present 01/11/2017  . Anxiety   . CKD (chronic kidney disease)   . COPD (chronic obstructive pulmonary disease) (HCC)     never smoker, industrial exposure  . Diabetes mellitus with diabetic neuropathy, with long-term current use of insulin (HCC)   . GERD (gastroesophageal reflux disease)   . High cholesterol   . History of gout    "take RX qd" (11/01/2017)  . Hypertension   . Nonobstructive atherosclerosis of coronary artery   . On home oxygen therapy    "2L prn" (11/01/2017)  . 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HCC)    LLL, present for years  . Systolic HF (heart failure) [facility])     Patient Active Problem List   Diagnosis Date Noted  . Acute on chronic combined systolic and diastolic CHF (congestive heart failure) (HCC) 03/15/2018  . S/P right and left heart catheterization 11/02/2017  . Panic attacks 11/02/2017  . Non-STEMI (non-ST elevated myocardial infarction) (HCC)   . COPD (chronic obstructive pulmonary disease) (HCC)   . Hypertension   . CKD (chronic kidney disease)   . 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HCC)   . ICD (implantable cardioverter-defibrillator) in place   . GERD (gastroesophageal reflux disease)   . Diabetes mellitus with diabetic neuropathy, with long-term current use of insulin [name])     Past Surgical History:  Procedure Laterality Date  . CATARACT EXTRACTION W/ INTRAOCULAR LENS IMPLANTW/ TRABECULECTOMY Left ~ 2013   "may have done this when  I had my other eye OR"  . CIRCUMCISION    . CORONARY STENT INTERVENTION N/A 11/02/2017   Procedure: CORONARY STENT INTERVENTION;  Surgeon: Laurey MoraleMcLean, Dalton S, MD;  [facility];  Service: Cardiovascular;  Laterality: N/A;  . CORONARY STENT INTERVENTION N/A 11/02/2017   Procedure: CORONARY STENT INTERVENTION;  Surgeon: Lyn RecordsSmith, Henry W, MD;  Location: [facility] INVASIVE CV LAB;  Service: Cardiovascular;  Laterality: N/A;  . EYE SURGERY Left ~ 2013   "fell on something in basement; stuck in my eye"  . ICD IMPLANT     Medtronic Dual Chamber 01/11/17  . MULTIPLE TOOTH EXTRACTIONS     "pulled all my top teeth"  . RIGHT/LEFT HEART CATH AND CORONARY  ANGIOGRAPHY N/A 11/02/2017   Procedure: RIGHT/LEFT HEART CATH AND CORONARY ANGIOGRAPHY;  Surgeon: Laurey MoraleMcLean, Dalton S, MD;  [facility];  Service: Cardiovascular;  Laterality: N/A;        Home Medications    Prior to Admission medications   Medication Sig Start Date End Date Taking? Authorizing Provider  allopurinol (ZYLOPRIM) 100 MG tablet Take 1 tablet (100 mg total) by mouth daily. 12/26/17   Clegg, Amy D, NP  aspirin EC 81 MG EC tablet Take 1 tablet (81 mg total) by mouth daily. 11/07/17   Arnetha CourserAmin, Sumayya, MD  atorvastatin (LIPITOR) 40 MG tablet TAKE 1 TABLET(40 MG) BY MOUTH DAILY AT 6 PM 09/30/18   Laurey MoraleMcLean, Dalton S, MD  baclofen (LIORESAL) 10 MG tablet Take 1 tablet (10 mg total) by mouth 3 (three) times daily. 04/22/19   Hyman HopesVenter, Eliasar Hlavaty, PA-C  bisoprolol (ZEBETA) 5 MG tablet Take 0.5 tablets (2.5 mg total) by mouth daily. 09/18/18 09/18/19  Clegg, Amy D, NP  BREO ELLIPTA 100-25 MCG/INH AEPB Inhale 1 puff into the lungs daily. 10/28/17   [provider]  digoxin (LANOXIN) 0.125 MG tablet TAKE 1 TABLET(0.125 MG) BY MOUTH DAILY 02/12/19   Laurey MoraleMcLean, Dalton S, MD  diphenhydrAMINE (BENADRYL) 25 MG tablet Take 25 mg by mouth at bedtime as needed.    [provider]  DULoxetine (CYMBALTA) 60 MG capsule Take 60 mg by mouth daily. 09/28/17   [provider]  empagliflozin (JARDIANCE) 10 MG TABS tablet Take 10 mg by mouth daily.     [provider]  eplerenone (INSPRA) 25 MG tablet TAKE 1 TABLET(25 MG) BY MOUTH DAILY 03/31/19   Laurey MoraleMcLean, Dalton S, MD  hydrALAZINE (APRESOLINE) 25 MG tablet Take 1 tablet (25 mg total) by mouth 3 (three) times daily. 06/07/18   Laurey MoraleMcLean, Dalton S, MD  Insulin Pen Needle (NOVOFINE) 30G X 8 MM MISC Inject 10 each into the skin as needed. 11/06/17   Arnetha CourserAmin, Sumayya, MD  insulin regular human CONCENTRATED (HUMULIN R U-500 KWIKPEN) 500 UNIT/ML kwikpen USE AS DIRECTED PER SLIDING SCALE. MAX DAILY DOSE IS 400 UNITS. 07/18/17   [provider]  isosorbide dinitrate (ISORDIL) 20 MG tablet Take 1 tablet (20 mg total) by mouth 3 (three) times daily. 10/23/18   Clegg, Amy D, NP  MAGNESIUM-OXIDE 400 (241.3 Mg) MG tablet Take 400 mg by mouth daily. 10/28/17   [provider]  omeprazole (PRILOSEC) 20 MG capsule Take 20 mg by mouth daily. 10/04/17   [provider]  potassium chloride SA (K-DUR) 20 MEQ tablet TAKE 1 TABLET(20 MEQ) BY MOUTH DAILY 01/20/19   Laurey MoraleMcLean, Dalton S, MD  pregabalin (LYRICA) 100 MG capsule Take 1 capsule (100 mg total) by mouth 2 (two) times daily. 12/26/17   Clegg,  Amy D, NP  ranitidine (ZANTAC) 150 MG tablet Take 150 mg by mouth 2 (two) times daily.    [provider]  sacubitril-valsartan (ENTRESTO) 97-103 MG Take 1 tablet by mouth 2 (two) times daily. 01/22/19   Laurey Morale, MD  torsemide (DEMADEX) 20 MG tablet Take 1 tablet (20 mg total) by mouth 2 (two) times daily. 09/18/18   Clegg, Amy D, NP  Vitamin D, Cholecalciferol, 1000 units CAPS Take 1,000 Units by mouth daily. 10/17/17   [provider]    Family History Family History  Problem Relation Age of Onset  . Hypertension Mother   . Diabetes Mother   . Hypertension Father   . Diabetes Father   . Diabetes Sister   . Diabetes Brother     Social History Social History   Tobacco Use  . Smoking status: Former Smoker    Types: Cigarettes  . Smokeless tobacco: Never Used  . Tobacco comment: "smoked when I drank"  Substance Use Topics  . 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 Drug use: Never     Allergies   Codeine and Coconut oil   Review of Systems Review of Systems  Constitutional: Negative for chills and fever.  HENT:       + hiccups  Eyes: Negative for visual disturbance.  Respiratory: Positive for shortness of breath.   Cardiovascular: Negative for chest pain and leg swelling.  Gastrointestinal: Positive for abdominal pain and nausea. Negative  for constipation, diarrhea and vomiting.  Genitourinary: Negative for difficulty urinating.  Musculoskeletal: Negative for myalgias.  Skin: Negative for rash.  Neurological: Negative for headaches.     Physical Exam Updated Vital Signs Pulse 73   Temp 98.7 F (37.1 C) (Oral)   Resp 18   Ht 6\' 5"  (1.956 m)   Wt 120.2 kg   SpO2 95%   BMI 31.42 kg/m   Physical Exam Vitals signs and nursing note reviewed.  Constitutional:      Appearance: He is obese. He is not ill-appearing.     Comments: Not actively hiccuping  HENT:     Head: Normocephalic and atraumatic.  Eyes:     Conjunctiva/sclera: Conjunctivae normal.  Neck:     Musculoskeletal: Neck supple.  Cardiovascular:     Rate and Rhythm: Normal rate and regular rhythm.     Pulses: Normal pulses.  Pulmonary:     Effort: Pulmonary effort is normal.     Breath sounds: Wheezing present. No decreased breath sounds, rhonchi or rales.     Comments: On 2L oxygen which is pt's baseline. Satting 95%.  Abdominal:     Palpations: Abdomen is soft.     Tenderness: There is abdominal tenderness. There is no guarding or rebound.     Comments: Soft, tenderness to LUQ, +BS throughout, no r/g/r, neg murphy's, neg mcburney's, no CVA TTP  Musculoskeletal:     Right lower leg: No edema.     Left lower leg: No edema.  Skin:    General: Skin is warm and dry.  Neurological:     Mental Status: He is alert.      ED Treatments / Results  Labs (all labs ordered are listed, but only abnormal results are displayed) Labs Reviewed  COMPREHENSIVE METABOLIC PANEL - Abnormal; Notable for the following components:      Result Value   Chloride 93 (*)    CO2 33 (*)  Glucose, Bld 208 (*)    BUN 25 (*)    Creatinine, Ser 1.31 (*)    Total Bilirubin 2.5 (*)    All other components within normal limits  CBC WITH DIFFERENTIAL/PLATELET - Abnormal; Notable for the following components:   RBC 6.76 (*)    Hemoglobin 20.8 (*)    HCT 64.0 (*)    RDW  15.9 (*)    All other components within normal limits  BRAIN NATRIURETIC PEPTIDE - Abnormal; Notable for the following components:   B Natriuretic Peptide 160.1 (*)    All other components within normal limits  URINALYSIS, ROUTINE W REFLEX MICROSCOPIC - Abnormal; Notable for the following components:   Color, Urine AMBER (*)    Specific Gravity, Urine >1.030 (*)    Glucose, UA 100 (*)    Bilirubin Urine SMALL (*)    Protein, ur 100 (*)    All other components within normal limits  URINALYSIS, MICROSCOPIC (REFLEX) - Abnormal; Notable for the following components:   Bacteria, UA FEW (*)    All other components within normal limits  TROPONIN I (HIGH SENSITIVITY) - Abnormal; Notable for the following components:   Troponin I (High Sensitivity) 31 (*)    All other components within normal limits  TROPONIN I (HIGH SENSITIVITY) - Abnormal; Notable for the following components:   Troponin I (High Sensitivity) 25 (*)    All other components within normal limits  URINE CULTURE  LIPASE, BLOOD    EKG EKG Interpretation  Date/Time:  Tuesday April 22 2019 10:37:14 EDT Ventricular Rate:  81 PR Interval:    QRS Duration: 119 QT Interval:  431 QTC Calculation: 501 R Axis:   -109 Text Interpretation: Sinus rhythm Left atrial enlargement Left anterior fascicular block Probable anterolateral infarct, age indeterm No significant change since last tracing Confirmed by Fredia Sorrow [phone]) on 04/22/2019 10:44:03 AM   Radiology Dg Chest 2 View  Result Date: 04/22/2019 CLINICAL DATA:  Shortness of breath. EXAM: CHEST - 2 VIEW COMPARISON:  July 30, 2018. FINDINGS: Stable cardiomegaly. Left-sided pacemaker is unchanged in position. No pneumothorax or pleural effusion is noted. Right lung is clear. Stable rounded nodular density is noted in left lower lobe. No consolidative process is noted. Bony thorax is unremarkable. IMPRESSION: Grossly stable appearance of rounded nodular density seen in  left lower lobe. No other significant abnormality seen in the chest. Electronically Signed   By: Marijo Conception M.D.   On: 04/22/2019 11:53    Procedures Procedures (including critical care time)  Medications Ordered in ED Medications  albuterol (VENTOLIN HFA) 108 (90 Base) MCG/ACT inhaler 8 puff (8 puffs Inhalation Given 04/22/19 1222)  gabapentin (NEURONTIN) capsule 300 mg (300 mg Oral Given 04/22/19 1447)  sodium chloride 0.9 % bolus 500 mL (0 mLs Intravenous Stopped 04/22/19 1550)  ondansetron (ZOFRAN) injection 4 mg (4 mg Intravenous Given 04/22/19 1542)  alum & mag hydroxide-simeth (MAALOX/MYLANTA) 200-200-20 MG/5ML suspension 30 mL (30 mLs Oral Given 04/22/19 1542)    And  lidocaine (XYLOCAINE) 2 % viscous mouth solution 15 mL (15 mLs Oral Given 04/22/19 1543)     Initial Impression / Assessment and Plan / ED Course  I have reviewed the triage vital signs and the nursing notes.  Pertinent labs & imaging results that were available during my care of the patient were reviewed by me and considered in my medical decision making (see chart for details).    58 year old male who is poor historian who comes in with multiple  complaints including hiccups for the past 2 days, shortness of breath, left upper quadrant abdominal pain.  Not actively hiccuping in the room during my exam.  He states that it stopped just prior to arrival.  He has been able to control hiccups at home with small sips of water for the past few days.  Does report that he has left upper quadrant pain that sometimes makes him catch his breath.  Patient denies heavy alcohol use.  States he had 1 shot with his son for his birthday 6 days ago but otherwise does not drink.  On exam he is tender to the left upper quadrant.  No peritoneal signs.  His vital signs are stable today.  Patient afebrile without tachycardia or tachypnea.  Have some slight wheezing on exam.  Patient with history of COPD.  Will give 8 puffs albuterol  inhaler and reevaluate.  With significant cardiac history.  Given this we will obtain screening labs including CBC, CMP, lipase, troponin, BNP, chest x-ray.  He is not actively hiccuping will hold off on medication at this time but will continue to reevaluate.  EKG without ischemic changes but does show a prolonged QTC.   CBC with stable although increased hemoglobin and hematocrit.  It appears that patient has always had elevation in his hemoglobin.  Symptoms consistent with polycythemia vera.  Patient is currently on 81 mg aspirin daily.  Have him follow-up with his PCP regarding this.  CO2 mildly elevated at 33 although it appears patient is always elevated.  Creatinine stable from baseline.  Face negative.  BNP at 160, compared to baseline this has improved.  Troponin initially 31.  Will repeat.   Upon reevaluation patient hiccuping actively in the room.  Have discussed medication options with attending physician Dr. Deretha Emory -given prolonged QTC will refrain from using Thorazine or Reglan today.  Had initially ordered baclofen but not available at this ED.  Will switch to gabapentin at this time and reevaluate.  Feel that this will be the safest option for patient to be discharged home with as he is also currently on a PPI should work well together.  Regardless patient will need to follow-up with his PCP for further evaluation of his prolonged hiccups.   Urinalysis returned with increased spec gravity and amber colored. This with pt's elevated Hgb appears to be more of a dehydration - will give 500 CCs fluids. Do not want to give too much fluids given pt's hx of CHF. Pt without any urinary symptoms. Does have few bacteria with 6-10 WBCs; will send for culture. Repeat trop of 25; considering this is decreasing feel pt does not need admission at this time.   Nursing staff informed pt asking for something for nausea - when reevaluating patient it appears he is hiccuping and dry retching as he reports "it  feels like I cant catch my breath." O2 sats have remained above 100% on pt's typical 2L [place]. Will provide zofran as well as GI cocktail and reevaluate.   Pt appears more comfortable after GI cocktail. Will discharge home with short Rx baclofen to help with the hiccups. Pt advised to call PCP in the morning to schedule an appointment in the next week for further evaluation/recheck of Hgb and Hct. Pt is in agreement with plan at this time and stable for discharge home.       Final Clinical Impressions(s) / ED Diagnoses   Final diagnoses:  Hiccups  Shortness of breath    ED Discharge Orders  Ordered    baclofen (LIORESAL) 10 MG tablet  3 times daily     04/22/19 1609           Tanda RockersVenter, Keiran Gaffey, PA-C 04/22/19 1611    Vanetta MuldersZackowski, Scott, MD 05/01/19 [phone]

## 2019-04-22 NOTE — ED Notes (Signed)
Pt aware we need urine specimen, urinal provided 

## 2019-04-22 NOTE — ED Triage Notes (Signed)
Pt c/o hiccups x 2 days, also c/o sob and wheezing. Denies fever, pt reports diaphoresis, and chills. Pt took pepto bismol pta

## 2019-04-22 NOTE — Discharge Instructions (Signed)
Please follow up with your PCP regarding your hiccups. I have prescribed a medication that should help with your hiccups - it is different than the gabapentin given to you in the ED. Take as prescribed.   Increase your fluid intake for the next few days as you appear dehydrated. Your PCP will need to recheck a CBC on you as well as a BMP.

## 2019-04-23 LAB — URINE CULTURE: Culture: <10000 — AB

## 2019-06-03 ENCOUNTER — Ambulatory Visit (INDEPENDENT_AMBULATORY_CARE_PROVIDER_SITE_OTHER): Payer: Medicare Other | Admitting: *Deleted

## 2019-06-03 DIAGNOSIS — Z95810 Presence of automatic (implantable) cardiac defibrillator: Secondary | ICD-10-CM | POA: Diagnosis not present

## 2019-06-03 LAB — CUP PACEART REMOTE DEVICE CHECK
Battery Remaining Longevity: 107 mo
Battery Voltage: 3 V
Brady Statistic AP VP Percent: 0 %
Brady Statistic AP VS Percent: 0.05 %
Brady Statistic AS VP Percent: 0.04 %
Brady Statistic AS VS Percent: 99.91 %
Brady Statistic RA Percent Paced: 0.05 %
Brady Statistic RV Percent Paced: 0.04 %
Date Time Interrogation Session: 20201208023324
HighPow Impedance: 76 Ohm
Implantable Lead Implant Date: 20180719
Implantable Lead Implant Date: 20180719
Implantable Lead Location: 753859
Implantable Lead Location: 753860
Implantable Lead Model: 5076
Implantable Pulse Generator Implant Date: 20180719
Lead Channel Impedance Value: 399 Ohm
Lead Channel Impedance Value: 418 Ohm
Lead Channel Impedance Value: 456 Ohm
Lead Channel Pacing Threshold Amplitude: 0.75 V
Lead Channel Pacing Threshold Amplitude: 0.75 V
Lead Channel Pacing Threshold Pulse Width: 0.4 ms
Lead Channel Pacing Threshold Pulse Width: 0.4 ms
Lead Channel Sensing Intrinsic Amplitude: 4 mV
Lead Channel Sensing Intrinsic Amplitude: 4 mV
Lead Channel Sensing Intrinsic Amplitude: 9.75 mV
Lead Channel Sensing Intrinsic Amplitude: 9.75 mV
Lead Channel Setting Pacing Amplitude: 1.75 V
Lead Channel Setting Pacing Amplitude: 2 V
Lead Channel Setting Pacing Pulse Width: 0.4 ms
Lead Channel Setting Sensing Sensitivity: 0.3 mV

## 2019-07-04 NOTE — Progress Notes (Signed)
ICD remote 

## 2019-07-18 ENCOUNTER — Other Ambulatory Visit: Payer: Self-pay

## 2019-07-18 ENCOUNTER — Encounter (HOSPITAL_COMMUNITY): Payer: Self-pay | Admitting: Cardiology

## 2019-07-18 ENCOUNTER — Ambulatory Visit (HOSPITAL_COMMUNITY)
Admission: RE | Admit: 2019-07-18 | Discharge: 2019-07-18 | Disposition: A | Discharge status: Home / Self Care | Payer: Medicare Other | Source: Ambulatory Visit | Attending: Cardiology | Admitting: Cardiology

## 2019-07-18 VITALS — BP 116/78 | HR 95 | Wt 262.2 lb

## 2019-07-18 DIAGNOSIS — I428 Other cardiomyopathies: Secondary | ICD-10-CM | POA: Diagnosis not present

## 2019-07-18 DIAGNOSIS — Z79899 Other long term (current) drug therapy: Secondary | ICD-10-CM | POA: Diagnosis not present

## 2019-07-18 DIAGNOSIS — D751 Secondary polycythemia: Secondary | ICD-10-CM | POA: Diagnosis not present

## 2019-07-18 DIAGNOSIS — J449 Chronic obstructive pulmonary disease, unspecified: Secondary | ICD-10-CM | POA: Diagnosis not present

## 2019-07-18 DIAGNOSIS — Z885 Allergy status to narcotic agent status: Secondary | ICD-10-CM | POA: Insufficient documentation

## 2019-07-18 DIAGNOSIS — Z794 Long term (current) use of insulin: Secondary | ICD-10-CM | POA: Diagnosis not present

## 2019-07-18 DIAGNOSIS — R9431 Abnormal electrocardiogram [ECG] [EKG]: Secondary | ICD-10-CM | POA: Diagnosis not present

## 2019-07-18 DIAGNOSIS — I2510 Atherosclerotic heart disease of native coronary artery without angina pectoris: Secondary | ICD-10-CM | POA: Insufficient documentation

## 2019-07-18 DIAGNOSIS — G4733 Obstructive sleep apnea (adult) (pediatric): Secondary | ICD-10-CM | POA: Diagnosis not present

## 2019-07-18 DIAGNOSIS — E1122 Type 2 diabetes mellitus with diabetic chronic kidney disease: Secondary | ICD-10-CM | POA: Diagnosis not present

## 2019-07-18 DIAGNOSIS — Z833 Family history of diabetes mellitus: Secondary | ICD-10-CM | POA: Insufficient documentation

## 2019-07-18 DIAGNOSIS — I5022 Chronic systolic (congestive) heart failure: Secondary | ICD-10-CM | POA: Diagnosis not present

## 2019-07-18 DIAGNOSIS — K219 Gastro-esophageal reflux disease without esophagitis: Secondary | ICD-10-CM | POA: Insufficient documentation

## 2019-07-18 DIAGNOSIS — I5043 Acute on chronic combined systolic (congestive) and diastolic (congestive) heart failure: Secondary | ICD-10-CM

## 2019-07-18 DIAGNOSIS — E11621 Type 2 diabetes mellitus with foot ulcer: Secondary | ICD-10-CM | POA: Insufficient documentation

## 2019-07-18 DIAGNOSIS — Z87891 Personal history of nicotine dependence: Secondary | ICD-10-CM | POA: Diagnosis not present

## 2019-07-18 DIAGNOSIS — Z7951 Long term (current) use of inhaled steroids: Secondary | ICD-10-CM | POA: Insufficient documentation

## 2019-07-18 DIAGNOSIS — Z7982 Long term (current) use of aspirin: Secondary | ICD-10-CM | POA: Insufficient documentation

## 2019-07-18 DIAGNOSIS — Z8249 Family history of ischemic heart disease and other diseases of the circulatory system: Secondary | ICD-10-CM | POA: Insufficient documentation

## 2019-07-18 DIAGNOSIS — Z955 Presence of coronary angioplasty implant and graft: Secondary | ICD-10-CM | POA: Diagnosis not present

## 2019-07-18 DIAGNOSIS — Z91018 Allergy to other foods: Secondary | ICD-10-CM | POA: Insufficient documentation

## 2019-07-18 DIAGNOSIS — I130 Hypertensive heart and chronic kidney disease with heart failure and stage 1 through stage 4 chronic kidney disease, or unspecified chronic kidney disease: Secondary | ICD-10-CM | POA: Insufficient documentation

## 2019-07-18 DIAGNOSIS — I454 Nonspecific intraventricular block: Secondary | ICD-10-CM | POA: Insufficient documentation

## 2019-07-18 LAB — DIGOXIN LEVEL: Digoxin Level: <2 ng/mL (ref 0.8–2.0)

## 2019-07-18 LAB — BASIC METABOLIC PANEL
Anion gap: 8 (ref 5–15)
BUN: 13 mg/dL (ref 6–20)
CO2: 33 mmol/L — ABNORMAL HIGH (ref 22–32)
Calcium: 8.9 mg/dL (ref 8.9–10.3)
Chloride: 100 mmol/L (ref 98–111)
Creatinine, Ser: 1.2 mg/dL (ref 0.61–1.24)
GFR calc Af Amer: >60 mL/min (ref >60)
GFR calc non Af Amer: >60 mL/min (ref >60)
Glucose, Bld: 89 mg/dL (ref 70–99)
Potassium: 4.6 mmol/L (ref 3.5–5.1)
Sodium: 141 mmol/L (ref 135–145)

## 2019-07-18 LAB — LIPID PANEL
Cholesterol: 88 mg/dL (ref 0–200)
HDL: 28 mg/dL — ABNORMAL LOW (ref >40)
LDL Cholesterol: 37 mg/dL (ref 0–99)
Total CHOL/HDL Ratio: 3.1 RATIO
Triglycerides: 113 mg/dL (ref <150)
VLDL: 23 mg/dL (ref 0–40)

## 2019-07-18 MED ORDER — EPLERENONE 50 MG PO TABS: 50.0000 mg | ORAL_TABLET | Freq: Every day | ORAL | 5 refills | Status: DC

## 2019-07-18 MED ORDER — TORSEMIDE 20 MG PO TABS: 40.0000 mg | ORAL_TABLET | Freq: Two times a day (BID) | ORAL | 3 refills | Status: DC

## 2019-07-18 NOTE — Progress Notes (Signed)
Pt referred back for [employer].  Pt has been part of [employer] several times but has been discharged due to noncompliance and not communicating with paramedic.  CSW completed referral form and sent out to community paramedics to be assigned- requests Samul Dada, LCSW Clinical Social Worker [facility] Desk#: [phone] Cell#: [phone]

## 2019-07-18 NOTE — Patient Instructions (Addendum)
INCREASE Torsemide to 40mg  (2 tabs) twice a day   INCREASE Eplerenone to 50mg  (1 tab) daily   You will be referred to [facility].  You will be contacted to schedule your home visit.    Labs today and repeat in 1 week  We will only contact you if something comes back abnormal or we need to make some changes. Otherwise no news is good news!  Your physician has recommended that you have a cardiopulmonary stress test (CPX). CPX testing is a non-invasive measurement of heart and lung function. It replaces a traditional treadmill stress test. This type of test provides a tremendous amount of information that relates not only to your present condition but also for future outcomes. This test combines measurements of you ventilation, respiratory gas exchange in the lungs, electrocardiogram (EKG), blood pressure and physical response before, during, and following an exercise protocol.  Your physician recommends that you schedule a follow-up appointment in: 2 weeks with the Nurse Practitioner/Physicians Assistant   Please call office at [phone] option 2 if you have any questions or concerns.    At the [facility], you and your health needs are our priority. As part of our continuing mission to provide you with exceptional heart care, we have created designated Provider Care Teams. These Care Teams include your primary Cardiologist (physician) and Advanced Practice Providers (APPs- Physician Assistants and Nurse Practitioners) who all work together to provide you with the care you need, when you need it.   You may see any of the following providers on your designated Care Team at your next follow up: [facility] Dr [phone] . Dr Marland Kitchen . Arvilla Meres, NP . Marca Ancona, PA . Tonye Becket, PharmD   Please be sure to bring in all your medications bottles to every appointment.

## 2019-07-20 NOTE — Progress Notes (Addendum)
Advanced Heart Failure Clinic Note   PCP: Wallene Dales, MD Cardiologist: Dr Aundra Dubin  HPI: Richard [name] is a 59 y.o. male with a history of chronic systolic HF s/p Medtronic ICD (12/2016), HTN, DM, and HLD.   Admitted 6/4-3/32/95 with A/C systolic HF. Echo showed EF 15-20%. Advanced HF team was consulted. He diuresed 20 lbs with IV lasix, then transitioned to torsemide 40 mg daily. Underwent R/LHC showing a focal severe RCA stenosis treated with DES.  He did not, however, appear to have severe enough coronary disease to explain his cardiomyopathy. Cardiac index was low at 1.9.  HF meds were optimized. Beta blocker was not started due to low CI and soft BPs. He was discharged on home oxygen. Referred to cardiac rehab. DC weight: 260 lbs.   Echo in 2/20 showed EF 20-25% with severe LV dilation, moderate RV enlargement with moderately decreased RV systolic function.   He presents today for followup of CHF.  Weight is up 7 lbs from last appointment.  He was out of torsemide for about 2 wks and restarted it about 2 weeks ago.  He has had trouble keeping up with his medications since paramedicine is no longer going out to his house.  His legs are now swelling and he has been sleeping on his knees beside the bed due to orthopnea.  His abdomen is distended.  He is short of breath if he tries to walk fast, goes further than about 50 feet, or goes up an incline. He is on oxygen at night, unable to tolerate CPAP. No chest pain.    Labs (10/19): K 3.9, creatinine 1.18  Labs (10/20): BNP 160, K 3.8, creatinine 1.31  ECG (personally reviewed): NSR, IVCD 130 msec  PMH: 1. Chronic systolic CHF: Primarily nonischemic cardiomyopathy.  Has Medtronic ICD.   - Echo 5/19: EF 15-20%, grade 2 DD, mild MR, RV severely reduced, LA moderately dilated, RA mildly dilated, mild TR.  - RHC/LHC (5/19): 80% distal LAD stenosis, 80% proximal RCA stenosis treated with DES to pRCA. Mean RA 13, PA 69/31 mean 47, mean 24, CI 1.91,  PVR 4.8 WU.  - Gynecomastia with spironolactone. - Echo (2/20): EF 20-25%, severe LV enlargement, moderate RV enlargement, moderately decreased RV systolic function.  2. COPD: Uses oxygen at night. Never smoked, but apparently had significant occupational exposure. It appears that he won a lawsuit dealing with the occupational exposure-related COPD.  3. CAD: Cath in 5/17 with 60% RCA stenosis.  - LHC (5/19): 80% distal LAD, 80% proximal RCA.  DES to proximal RCA.  4. Polycythemia: Probably due to chronic hypoxemia.  5. Type II diabetes.  6. HTN 7. GERD 8. CKD: Stage 3.  Likely due to diabetes and HTN.  9. OSA: Unable to tolerate CPAP.   Review of systems complete and found to be negative unless listed in HPI.    Current Outpatient Medications  Medication Sig Dispense Refill  . acetaminophen (TYLENOL) 650 MG CR tablet Take 650 mg by mouth every 8 (eight) hours as needed for pain.    Marland Kitchen albuterol (PROVENTIL) (2.5 MG/3ML) 0.083% nebulizer solution Inhale into the lungs.    Marland Kitchen allopurinol (ZYLOPRIM) 100 MG tablet Take 1 tablet (100 mg total) by mouth daily. 30 tablet 3  . aspirin 325 MG tablet Take 325 mg by mouth daily.    Marland Kitchen atorvastatin (LIPITOR) 40 MG tablet TAKE 1 TABLET(40 MG) BY MOUTH DAILY AT 6 PM 90 tablet 3  . baclofen (LIORESAL) 10 MG tablet Take 1  tablet (10 mg total) by mouth 3 (three) times daily. 30 each 0  . bisacodyl (DULCOLAX) 5 MG EC tablet Take 5 mg by mouth daily as needed for moderate constipation.    . bisoprolol (ZEBETA) 5 MG tablet Take 0.5 tablets (2.5 mg total) by mouth daily. 15 tablet 11  . BREO ELLIPTA 100-25 MCG/INH AEPB Inhale 1 puff into the lungs daily.  5  . digoxin (LANOXIN) 0.125 MG tablet TAKE 1 TABLET(0.125 MG) BY MOUTH DAILY 30 tablet 3  . diphenhydrAMINE (BENADRYL) 25 MG tablet Take 25 mg by mouth at bedtime as needed.    . DULoxetine (CYMBALTA) 60 MG capsule Take 60 mg by mouth daily.  2  . eplerenone (INSPRA) 50 MG tablet Take 1 tablet (50 mg total)  by mouth daily. 30 tablet 5  . hydrALAZINE (APRESOLINE) 25 MG tablet Take 1 tablet (25 mg total) by mouth 3 (three) times daily. 270 tablet 3  . Insulin Pen Needle (NOVOFINE) 30G X 8 MM MISC Inject 10 each into the skin as needed. 100 each 0  . insulin regular human CONCENTRATED (HUMULIN R U-500 KWIKPEN) 500 UNIT/ML kwikpen USE AS DIRECTED PER SLIDING SCALE. MAX DAILY DOSE IS 400 UNITS.    Marland Kitchen isosorbide dinitrate (ISORDIL) 20 MG tablet Take 1 tablet (20 mg total) by mouth 3 (three) times daily. 90 tablet 10  . JARDIANCE 25 MG TABS tablet Take 25 mg by mouth daily.    Marland Kitchen MAGNESIUM-OXIDE 400 (241.3 Mg) MG tablet Take 400 mg by mouth daily.  11  . omeprazole (PRILOSEC) 20 MG capsule Take 20 mg by mouth daily.  1  . potassium chloride SA (K-DUR) 20 MEQ tablet TAKE 1 TABLET(20 MEQ) BY MOUTH DAILY 90 tablet 3  . pregabalin (LYRICA) 100 MG capsule Take 1 capsule (100 mg total) by mouth 2 (two) times daily. 60 capsule 3  . ranitidine (ZANTAC) 150 MG tablet Take 150 mg by mouth 2 (two) times daily.    . sacubitril-valsartan (ENTRESTO) 97-103 MG Take 1 tablet by mouth 2 (two) times daily. 180 tablet 3  . sennosides-docusate sodium (SENOKOT-S) 8.6-50 MG tablet Take 1 tablet by mouth daily.    Marland Kitchen torsemide (DEMADEX) 20 MG tablet Take 2 tablets (40 mg total) by mouth 2 (two) times daily. 360 tablet 3  . Vitamin D, Cholecalciferol, 1000 units CAPS Take 1,000 Units by mouth daily.  11   No current facility-administered medications for this encounter.   Allergies  Allergen Reactions  . Codeine   . Coconut Oil Itching    Social History   Socioeconomic History  . Marital status: Divorced    Spouse name: Not on file  . Number of children: Not on file  . Years of education: Not on file  . Highest education level: Not on file  Occupational History  . Occupation: disability    Comment: stopped working in 2000 d/t occupational exposures  Tobacco Use  . Smoking status: Former Smoker    Types: Cigarettes   . Smokeless tobacco: Never Used  . Tobacco comment: "smoked when I drank"  Substance and Sexual Activity  . 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 Drug use: Never  . Sexual activity: Not Currently  Other Topics Concern  . Not on file  Social History Narrative   Patient lives at home with wife and some of his 10 children. He self-administers his own medications.   Social Determinants of  Health   Financial Resource Strain:   . Difficulty of Paying Living Expenses: Not on file  Food Insecurity:   . Worried About Programme researcher, broadcasting/film/video in the Last Year: Not on file  . Ran Out of Food in the Last Year: Not on file  Transportation Needs:   . Lack of Transportation (Medical): Not on file  . Lack of Transportation (Non-Medical): Not on file  Physical Activity:   . Days of Exercise per Week: Not on file  . Minutes of Exercise per Session: Not on file  Stress:   . Feeling of Stress : Not on file  Social Connections:   . Frequency of Communication with Friends and Family: Not on file  . Frequency of Social Gatherings with Friends and Family: Not on file  . Attends Religious Services: Not on file  . Active Member of Clubs or Organizations: Not on file  . Attends Banker Meetings: Not on file  . Marital Status: Not on file  Intimate Partner Violence:   . Fear of Current or Ex-Partner: Not on file  . Emotionally Abused: Not on file  . Physically Abused: Not on file  . Sexually Abused: Not on file   Family History  Problem Relation Age of Onset  . Hypertension Mother   . Diabetes Mother   . Hypertension Father   . Diabetes Father   . Diabetes Sister   . Diabetes Brother    Vitals:   07/18/19 1419  BP: 116/78  Pulse: 95  SpO2: 92%  Weight: 118.9 kg (262 lb 3.2 oz)     Wt Readings from Last 3 Encounters:  07/18/19 118.9 kg (262 lb 3.2 oz)  04/22/19 120.2 kg (265 lb)  03/05/19 119.7 kg (264 lb)   PHYSICAL  EXAM: General: NAD Neck: JVP 10 cm, no thyromegaly or thyroid nodule.  Lungs: Clear to auscultation bilaterally with normal respiratory effort. CV: Nondisplaced PMI.  Heart regular S1/S2, no S3/S4, no murmur.  1-2+ edema to knees.  No carotid bruit.  Unable to palpate pedal pulses.  Abdomen: Soft, nontender, no hepatosplenomegaly, mild abdominal distention.  Skin: Intact without lesions or rashes.  Neurologic: Alert and oriented x 3.  Psych: Normal affect. Extremities: No clubbing or cyanosis.  HEENT: Normal.   ASSESSMENT & PLAN:  1. Chronic systolic CHF:  Primarily nonischemic cardiomyopathy. Echo in 2017 with EF 15-20%. Medtronic ICD. Echo 10/2017 EF 15-20%, severely dilated RV with severely decreased systolic function. LHC/RHC 11/06/17 showed volume overload with 80% RCA stenosis.  Cardiac index low at 1.91. The degree of coronary disease does not explain his cardiomyopathy.  Echo in 2/20 showed severe LV dilation, EF 20-25%, moderately decreased RV systolic function.  NYHA class III symptoms, worse recently. He is volume overloaded on exam.  This was likely triggered by being out of torsemide x 2 wks.  - Increase torsemide to 40 mg bid.   BMET today and in 1 week.  - Continue entresto 49/51 mg BID.  - Increase eplerenone to 50 mg daily.   - Continue hydralazine 25 mg tid and isordil to 20 tid.   - Continue digoxin 0.125 mg daily, check level today.  - Continue bisoprolol 2.5 mg daily.  - Continue empagliflozin.  - Arrange for CPX when volume is better controlled.  He may be a future LVAD candidate.      2. COPD: Never smoked, but apparently had significant occupational exposure. It appears that he won a lawsuit dealing with the occupational exposure-related  COPD.  3. CAD:  LHC in 5/19 with 80-90% proximal RCA stenosis treated with DES to RCA.  This did not cause his cardiomyopathy but is a large RCA.  No chest pain.  - Continue ASA 81 daily.  - Continue atorvastatin, check lipids  today.  4. Polycythemia: Likely related to chronic hypoxia.  5. OSA: Cannot tolerate CPAP, wears oxygen at night.  6. DM: He is on empagliflozin.   7. Ulcer: Slow to heal foot ulcer.  He had peripheral arterial dopplers arranged by PCP, result is not available yet.   I will refer him back to paramedicine.  I would like him to followup in 2 wks with NP/PA.   Marca Ancona 07/20/2019

## 2019-07-20 NOTE — Addendum Note (Signed)
Encounter addended by: Laurey Morale, MD on: 07/20/2019 12:28 PM  Actions taken: Clinical Note Signed

## 2019-07-21 ENCOUNTER — Telehealth (HOSPITAL_COMMUNITY): Payer: Self-pay

## 2019-07-21 DIAGNOSIS — I5022 Chronic systolic (congestive) heart failure: Secondary | ICD-10-CM

## 2019-07-21 NOTE — Telephone Encounter (Signed)
-----   Message from Laurey Morale, MD sent at 07/20/2019  9:55 AM EST ----- Need repeat digoxin level, <2.0 does not make sense.

## 2019-07-21 NOTE — Telephone Encounter (Signed)
Called patient to discuss blood work and need for repeat labs.  Advised patient of dig level and that it needs to be rechecked.  Pt is not sure if he is taking his medications the right way.  Pt states Zach of Paramedicine should be visiting him, but he does not know when.  Called and LM for Zach to discuss. Pt has lab appt already scheduled for 1/29.  Cannot come in sooner.

## 2019-07-24 ENCOUNTER — Telehealth (HOSPITAL_COMMUNITY): Payer: Self-pay

## 2019-07-24 NOTE — Telephone Encounter (Signed)
Mr Dyar returned my call and advised he would be available to meet tomorrow. We agreed to meet at 10:00.  [name], EMT 07/24/19

## 2019-07-24 NOTE — Telephone Encounter (Signed)
I called Richard [name] to schedule and appointment. He did not answer so I left a message requesting he call me back. I will reach back out tomorrow if I don't hear back today.  [name], EMT 07/24/19

## 2019-07-25 ENCOUNTER — Ambulatory Visit (HOSPITAL_COMMUNITY)
Admission: RE | Admit: 2019-07-25 | Discharge: 2019-07-25 | Disposition: A | Discharge status: Home / Self Care | Payer: Medicare Other | Source: Ambulatory Visit | Attending: Cardiology | Admitting: Cardiology

## 2019-07-25 ENCOUNTER — Other Ambulatory Visit: Payer: Self-pay

## 2019-07-25 ENCOUNTER — Other Ambulatory Visit (HOSPITAL_COMMUNITY): Payer: Self-pay

## 2019-07-25 DIAGNOSIS — I5022 Chronic systolic (congestive) heart failure: Secondary | ICD-10-CM | POA: Diagnosis not present

## 2019-07-25 LAB — BASIC METABOLIC PANEL
Anion gap: 9 (ref 5–15)
BUN: 14 mg/dL (ref 6–20)
CO2: 35 mmol/L — ABNORMAL HIGH (ref 22–32)
Calcium: 8.8 mg/dL — ABNORMAL LOW (ref 8.9–10.3)
Chloride: 94 mmol/L — ABNORMAL LOW (ref 98–111)
Creatinine, Ser: 1.22 mg/dL (ref 0.61–1.24)
GFR calc Af Amer: >60 mL/min (ref >60)
GFR calc non Af Amer: >60 mL/min (ref >60)
Glucose, Bld: 113 mg/dL — ABNORMAL HIGH (ref 70–99)
Potassium: 3.7 mmol/L (ref 3.5–5.1)
Sodium: 138 mmol/L (ref 135–145)

## 2019-07-25 LAB — DIGOXIN LEVEL: Digoxin Level: <0.2 ng/mL — ABNORMAL LOW (ref 0.8–2.0)

## 2019-07-25 NOTE — Progress Notes (Signed)
Paramedicine Encounter    Patient ID: Aariv Medlock, male    DOB: 06-11-1961, 59 y.o.   MRN: [id number]   Patient Care Team: Wallene Dales, MD as PCP - General (Family Medicine) Larey Dresser, MD as PCP - Advanced Heart Failure (Cardiology) Jorge Ny, LCSW as Social Worker (Licensed Clinical Social Worker)  Patient Active Problem List   Diagnosis Date Noted  . Acute on chronic combined systolic and diastolic CHF (congestive heart failure) ([place]) 03/15/2018  . S/P right and left heart catheterization 11/02/2017  . Panic attacks 11/02/2017  . Non-STEMI (non-ST elevated myocardial infarction) ([place])   . COPD (chronic obstructive pulmonary disease) ([place])   . Hypertension   . CKD (chronic kidney disease)   . 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[place])   . ICD (implantable cardioverter-defibrillator) in place   . GERD (gastroesophageal reflux disease)   . Diabetes mellitus with diabetic neuropathy, with long-term current use of insulin ([facility])     Current Outpatient Medications:  .  acetaminophen (TYLENOL) 650 MG CR tablet, Take 650 mg by mouth every 8 (eight) hours as needed for pain., Disp: , Rfl:  .  albuterol (PROVENTIL) (2.5 MG/3ML) 0.083% nebulizer solution, Inhale into the lungs., Disp: , Rfl:  .  allopurinol (ZYLOPRIM) 100 MG tablet, Take 1 tablet (100 mg total) by mouth daily., Disp: 30 tablet, Rfl: 3 .  aspirin 325 MG tablet, Take 325 mg by mouth daily., Disp: , Rfl:  .  atorvastatin (LIPITOR) 40 MG tablet, TAKE 1 TABLET(40 MG) BY MOUTH DAILY AT 6 PM, Disp: 90 tablet, Rfl: 3 .  baclofen (LIORESAL) 10 MG tablet, Take 1 tablet (10 mg total) by mouth 3 (three) times daily., Disp: 30 each, Rfl: 0 .  bisacodyl (DULCOLAX) 5 MG EC tablet, Take 5 mg by mouth daily as needed for moderate constipation., Disp: , Rfl:  .  BREO ELLIPTA 100-25 MCG/INH AEPB, Inhale 1 puff into the lungs daily., Disp: , Rfl: 5 .  digoxin (LANOXIN) 0.125 MG tablet, TAKE 1 TABLET(0.125 MG) BY MOUTH DAILY, Disp: 30 tablet,  Rfl: 3 .  diphenhydrAMINE (BENADRYL) 25 MG tablet, Take 25 mg by mouth at bedtime as needed., Disp: , Rfl:  .  DULoxetine (CYMBALTA) 60 MG capsule, Take 60 mg by mouth daily., Disp: , Rfl: 2 .  eplerenone (INSPRA) 50 MG tablet, Take 1 tablet (50 mg total) by mouth daily., Disp: 30 tablet, Rfl: 5 .  hydrALAZINE (APRESOLINE) 25 MG tablet, Take 1 tablet (25 mg total) by mouth 3 (three) times daily., Disp: 270 tablet, Rfl: 3 .  Insulin Pen Needle (NOVOFINE) 30G X 8 MM MISC, Inject 10 each into the skin as needed., Disp: 100 each, Rfl: 0 .  insulin regular human CONCENTRATED (HUMULIN R U-500 KWIKPEN) 500 UNIT/ML kwikpen, USE AS DIRECTED PER SLIDING SCALE. MAX DAILY DOSE IS 400 UNITS., Disp: , Rfl:  .  isosorbide dinitrate (ISORDIL) 20 MG tablet, Take 1 tablet (20 mg total) by mouth 3 (three) times daily., Disp: 90 tablet, Rfl: 10 .  JARDIANCE 25 MG TABS tablet, Take 25 mg by mouth daily., Disp: , Rfl:  .  MAGNESIUM-OXIDE 400 (241.3 Mg) MG tablet, Take 400 mg by mouth daily., Disp: , Rfl: 11 .  omeprazole (PRILOSEC) 20 MG capsule, Take 20 mg by mouth daily., Disp: , Rfl: 1 .  potassium chloride SA (K-DUR) 20 MEQ tablet, TAKE 1 TABLET(20 MEQ) BY MOUTH DAILY, Disp: 90 tablet, Rfl: 3 .  pregabalin (LYRICA) 100 MG capsule, Take 1 capsule (100  mg total) by mouth 2 (two) times daily., Disp: 60 capsule, Rfl: 3 .  sacubitril-valsartan (ENTRESTO) 97-103 MG, Take 1 tablet by mouth 2 (two) times daily., Disp: 180 tablet, Rfl: 3 .  sennosides-docusate sodium (SENOKOT-S) 8.6-50 MG tablet, Take 1 tablet by mouth daily., Disp: , Rfl:  .  torsemide (DEMADEX) 20 MG tablet, Take 2 tablets (40 mg total) by mouth 2 (two) times daily., Disp: 360 tablet, Rfl: 3 .  Vitamin D, Cholecalciferol, 1000 units CAPS, Take 1,000 Units by mouth daily., Disp: , Rfl: 11 .  bisoprolol (ZEBETA) 5 MG tablet, Take 0.5 tablets (2.5 mg total) by mouth daily. (Patient not taking: Reported on 07/25/2019), Disp: 15 tablet, Rfl: 11 .  ranitidine  (ZANTAC) 150 MG tablet, Take 150 mg by mouth 2 (two) times daily., Disp: , Rfl:  Allergies  Allergen Reactions  . Codeine   . Coconut Oil Itching      Social History   Socioeconomic History  . Marital status: Divorced    Spouse name: Not on file  . Number of children: Not on file  . Years of education: Not on file  . Highest education level: Not on file  Occupational History  . Occupation: disability    Comment: stopped working in 2000 d/t occupational exposures  Tobacco Use  . Smoking status: Former Smoker    Types: Cigarettes  . Smokeless tobacco: Never Used  . Tobacco comment: "smoked when I drank"  Substance and Sexual Activity  . 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 Drug use: Never  . Sexual activity: Not Currently  Other Topics Concern  . Not on file  Social History Narrative   Patient lives at home with wife and some of his 10 children. He self-administers his own medications.   Social Determinants of Health   Financial Resource Strain:   . Difficulty of Paying Living Expenses: Not on file  Food Insecurity:   . Worried About Programme researcher, broadcasting/film/video in the Last Year: Not on file  . Ran Out of Food in the Last Year: Not on file  Transportation Needs:   . Lack of Transportation (Medical): Not on file  . Lack of Transportation (Non-Medical): Not on file  Physical Activity:   . Days of Exercise per Week: Not on file  . Minutes of Exercise per Session: Not on file  Stress:   . Feeling of Stress : Not on file  Social Connections:   . Frequency of Communication with Friends and Family: Not on file  . Frequency of Social Gatherings with Friends and Family: Not on file  . Attends Religious Services: Not on file  . Active Member of Clubs or Organizations: Not on file  . Attends Banker Meetings: Not on file  . Marital Status: Not on file  Intimate Partner Violence:   . Fear of Current or  Ex-Partner: Not on file  . Emotionally Abused: Not on file  . Physically Abused: Not on file  . Sexually Abused: Not on file    Physical Exam Cardiovascular:     Rate and Rhythm: Normal rate and regular rhythm.     Pulses: Normal pulses.  Pulmonary:     Effort: Pulmonary effort is normal.     Breath sounds: Normal breath sounds.  Musculoskeletal:        General: Normal range of motion.     Right lower leg: Edema present.  Left lower leg: Edema present.  Skin:    General: Skin is warm and dry.     Capillary Refill: Capillary refill takes less than 2 seconds.  Neurological:     Mental Status: He is alert and oriented to person, place, and time.  Psychiatric:        Mood and Affect: Mood normal.         Future Appointments  Date Time Provider Department Center  07/25/2019  2:15 PM [facility] LAB [facility] None  08/04/2019  3:45 PM [facility] None  08/07/2019  1:00 PM [facility] LAB [facility]  08/07/2019  3:00 PM [facility] PA/NP [facility] None  09/02/2019  8:10 AM [facility] DEVICE REMOTES [facility]  12/02/2019  8:10 AM [facility] DEVICE REMOTES [facility]  03/02/2020  8:10 AM [facility] DEVICE REMOTES [facility]  06/01/2020  8:10 AM [facility] DEVICE REMOTES [facility]    BP 104/81 (BP Location: Left Arm, Patient Position: Sitting, Cuff Size: Normal)   Pulse 94   Resp 18   Wt 254 lb (115.2 kg)   SpO2 95%   BMI 30.12 kg/m   Weight yesterday- 250 lb Last visit weight- N/A  Mr Seybold was seen at home today for the first time since his re-referral to paramedicine. He reported feeling generally well, denying chest pain, SOB, headache, dizziness, orthopnea, fever or cough. He stated he has been compliant with his medications however upon verification of his medications I found that the bottles dated back as far as March of 2020. The only medication he was out of was bisoprolol so it was ordered from the pharmacy along  with a few others which he will need for next week. His pillbox was refilled and I will follow up on Wednesday of next week.   [name], EMT 07/25/19  ACTION: Home visit completed Next visit planned for 1 week

## 2019-07-29 ENCOUNTER — Telehealth (HOSPITAL_COMMUNITY): Payer: Self-pay

## 2019-07-29 NOTE — Telephone Encounter (Signed)
LM for patient x3 to review lab work. Spoke with Timothy Lasso, he will see patient tomorrow. Apparently pt was not on medication until last week.  Timothy Lasso will review meds and compliance with patient at home visit tomorrow. Pt has f/u 2/11 in office.

## 2019-07-29 NOTE — Telephone Encounter (Signed)
-----   Message from Laurey Morale, MD sent at 07/25/2019  3:50 PM EST ----- Make sure he is taking digoxin

## 2019-07-30 ENCOUNTER — Other Ambulatory Visit (HOSPITAL_COMMUNITY): Payer: Self-pay

## 2019-07-30 NOTE — Progress Notes (Signed)
Paramedicine Encounter    Patient ID: Richard [name], male    DOB: Apr 10, 1961, 59 y.o.   MRN: [id number]   Patient Care Team: Ananias Pilgrim, MD as PCP - General (Family Medicine) Laurey Morale, MD as PCP - Advanced Heart Failure (Cardiology) Burna Sis, LCSW as Social Worker (Licensed Clinical Social Worker)  Patient Active Problem List   Diagnosis Date Noted  . Acute on chronic combined systolic and diastolic CHF (congestive heart failure) (HCC) 03/15/2018  . S/P right and left heart catheterization 11/02/2017  . Panic attacks 11/02/2017  . Non-STEMI (non-ST elevated myocardial infarction) (HCC)   . COPD (chronic obstructive pulmonary disease) (HCC)   . Hypertension   . CKD (chronic kidney disease)   . 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HCC)   . ICD (implantable cardioverter-defibrillator) in place   . GERD (gastroesophageal reflux disease)   . Diabetes mellitus with diabetic neuropathy, with long-term current use of insulin (HCC)     Current Outpatient Medications:  .  acetaminophen (TYLENOL) 650 MG CR tablet, Take 650 mg by mouth every 8 (eight) hours as needed for pain., Disp: , Rfl:  .  albuterol (PROVENTIL) (2.5 MG/3ML) 0.083% nebulizer solution, Inhale into the lungs., Disp: , Rfl:  .  allopurinol (ZYLOPRIM) 100 MG tablet, Take 1 tablet (100 mg total) by mouth daily., Disp: 30 tablet, Rfl: 3 .  aspirin 325 MG tablet, Take 325 mg by mouth daily., Disp: , Rfl:  .  atorvastatin (LIPITOR) 40 MG tablet, TAKE 1 TABLET(40 MG) BY MOUTH DAILY AT 6 PM, Disp: 90 tablet, Rfl: 3 .  baclofen (LIORESAL) 10 MG tablet, Take 1 tablet (10 mg total) by mouth 3 (three) times daily., Disp: 30 each, Rfl: 0 .  bisacodyl (DULCOLAX) 5 MG EC tablet, Take 5 mg by mouth daily as needed for moderate constipation., Disp: , Rfl:  .  BREO ELLIPTA 100-25 MCG/INH AEPB, Inhale 1 puff into the lungs daily., Disp: , Rfl: 5 .  digoxin (LANOXIN) 0.125 MG tablet, TAKE 1 TABLET(0.125 MG) BY MOUTH DAILY, Disp: 30 tablet,  Rfl: 3 .  DULoxetine (CYMBALTA) 60 MG capsule, Take 60 mg by mouth daily., Disp: , Rfl: 2 .  eplerenone (INSPRA) 50 MG tablet, Take 1 tablet (50 mg total) by mouth daily., Disp: 30 tablet, Rfl: 5 .  hydrALAZINE (APRESOLINE) 25 MG tablet, Take 1 tablet (25 mg total) by mouth 3 (three) times daily., Disp: 270 tablet, Rfl: 3 .  isosorbide dinitrate (ISORDIL) 20 MG tablet, Take 1 tablet (20 mg total) by mouth 3 (three) times daily., Disp: 90 tablet, Rfl: 10 .  JARDIANCE 25 MG TABS tablet, Take 25 mg by mouth daily., Disp: , Rfl:  .  MAGNESIUM-OXIDE 400 (241.3 Mg) MG tablet, Take 400 mg by mouth daily., Disp: , Rfl: 11 .  omeprazole (PRILOSEC) 20 MG capsule, Take 20 mg by mouth daily., Disp: , Rfl: 1 .  potassium chloride SA (K-DUR) 20 MEQ tablet, TAKE 1 TABLET(20 MEQ) BY MOUTH DAILY, Disp: 90 tablet, Rfl: 3 .  pregabalin (LYRICA) 100 MG capsule, Take 1 capsule (100 mg total) by mouth 2 (two) times daily., Disp: 60 capsule, Rfl: 3 .  sacubitril-valsartan (ENTRESTO) 97-103 MG, Take 1 tablet by mouth 2 (two) times daily., Disp: 180 tablet, Rfl: 3 .  sennosides-docusate sodium (SENOKOT-S) 8.6-50 MG tablet, Take 1 tablet by mouth daily., Disp: , Rfl:  .  torsemide (DEMADEX) 20 MG tablet, Take 2 tablets (40 mg total) by mouth 2 (two) times daily., Disp: 360 tablet,  Rfl: 3 .  Vitamin D, Cholecalciferol, 1000 units CAPS, Take 1,000 Units by mouth daily., Disp: , Rfl: 11 .  bisoprolol (ZEBETA) 5 MG tablet, Take 0.5 tablets (2.5 mg total) by mouth daily. (Patient not taking: Reported on 07/25/2019), Disp: 15 tablet, Rfl: 11 .  diphenhydrAMINE (BENADRYL) 25 MG tablet, Take 25 mg by mouth at bedtime as needed., Disp: , Rfl:  .  Insulin Pen Needle (NOVOFINE) 30G X 8 MM MISC, Inject 10 each into the skin as needed., Disp: 100 each, Rfl: 0 .  insulin regular human CONCENTRATED (HUMULIN R U-500 KWIKPEN) 500 UNIT/ML kwikpen, USE AS DIRECTED PER SLIDING SCALE. MAX DAILY DOSE IS 400 UNITS., Disp: , Rfl:  .  ranitidine  (ZANTAC) 150 MG tablet, Take 150 mg by mouth 2 (two) times daily., Disp: , Rfl:  Allergies  Allergen Reactions  . Codeine   . Coconut Oil Itching      Social History   Socioeconomic History  . Marital status: Divorced    Spouse name: Not on file  . Number of children: Not on file  . Years of education: Not on file  . Highest education level: Not on file  Occupational History  . Occupation: disability    Comment: stopped working in 2000 d/t occupational exposures  Tobacco Use  . Smoking status: Former Smoker    Types: Cigarettes  . Smokeless tobacco: Never Used  . Tobacco comment: "smoked when I drank"  Substance and Sexual Activity  . 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 Drug use: Never  . Sexual activity: Not Currently  Other Topics Concern  . Not on file  Social History Narrative   Patient lives at home with wife and some of his 10 children. He self-administers his own medications.   Social Determinants of Health   Financial Resource Strain:   . Difficulty of Paying Living Expenses: Not on file  Food Insecurity:   . Worried About Charity fundraiser in the Last Year: Not on file  . Ran Out of Food in the Last Year: Not on file  Transportation Needs:   . Lack of Transportation (Medical): Not on file  . Lack of Transportation (Non-Medical): Not on file  Physical Activity:   . Days of Exercise per Week: Not on file  . Minutes of Exercise per Session: Not on file  Stress:   . Feeling of Stress : Not on file  Social Connections:   . Frequency of Communication with Friends and Family: Not on file  . Frequency of Social Gatherings with Friends and Family: Not on file  . Attends Religious Services: Not on file  . Active Member of Clubs or Organizations: Not on file  . Attends Archivist Meetings: Not on file  . Marital Status: Not on file  Intimate Partner Violence:   . Fear of Current or  Ex-Partner: Not on file  . Emotionally Abused: Not on file  . Physically Abused: Not on file  . Sexually Abused: Not on file    Physical Exam Cardiovascular:     Rate and Rhythm: Normal rate and regular rhythm.     Pulses: Normal pulses.  Pulmonary:     Effort: Pulmonary effort is normal.     Breath sounds: Normal breath sounds.  Musculoskeletal:        General: Normal range of motion.     Right lower leg: No edema.  Left lower leg: No edema.  Skin:    General: Skin is warm and dry.     Capillary Refill: Capillary refill takes less than 2 seconds.  Neurological:     Mental Status: He is alert and oriented to person, place, and time.  Psychiatric:        Mood and Affect: Mood normal.         Future Appointments  Date Time Provider Department Center  08/04/2019  3:45 PM [facility] None  08/07/2019  1:00 PM [facility] LAB [facility]  08/07/2019  3:00 PM [facility] PA/NP [facility] None  09/02/2019  8:10 AM [facility] DEVICE REMOTES [facility]  12/02/2019  8:10 AM [facility] DEVICE REMOTES [facility]  03/02/2020  8:10 AM [facility] DEVICE REMOTES [facility]  06/01/2020  8:10 AM [facility] DEVICE REMOTES [facility]    BP 106/67 (BP Location: Left Arm, Patient Position: Sitting, Cuff Size: Normal)   Pulse 80   Resp 18   Wt 255 lb (115.7 kg)   SpO2 90%   BMI 30.24 kg/m   Weight yesterday- 254 lb Last visit weight- 254 lb  Mr Potts was seen at home today and reported feeling generally well. He denied SOB, headache, dizziness, orthopnea, fever or cough since our last visit. He has not been compliant with his medications, having missed multiple doses since Friday. When asked about this he stated "its too much medicine to take." I explained the seriousness of his illness and stressed the importance of taking his medications as directed. I explained that he must take medicine three times per day to give him th best chance  at living a longer life. He expressed understanding and was agreeable. His medications were verified and his pillbox was refilled. I will follow up next week.   [name], EMT 07/30/19  ACTION: Home visit completed Next visit planned for 1 week

## 2019-08-04 ENCOUNTER — Other Ambulatory Visit (HOSPITAL_COMMUNITY)
Admission: RE | Admit: 2019-08-04 | Discharge: 2019-08-04 | Disposition: A | Discharge status: Home / Self Care | Payer: Medicare Other | Source: Ambulatory Visit | Attending: Cardiology | Admitting: Cardiology

## 2019-08-04 DIAGNOSIS — Z20822 Contact with and (suspected) exposure to covid-19: Secondary | ICD-10-CM | POA: Insufficient documentation

## 2019-08-04 DIAGNOSIS — Z01812 Encounter for preprocedural laboratory examination: Secondary | ICD-10-CM | POA: Insufficient documentation

## 2019-08-04 LAB — SARS CORONAVIRUS 2 (TAT 6-24 HRS): SARS Coronavirus 2: NEGATIVE

## 2019-08-04 NOTE — Progress Notes (Signed)
Pt scheduled for covid test at 1545- testing site closes at 1530. Attempted to contact patient to reschedule appointment, no answer. Voicemail set up states number is for "[name]." Did not leave message due to possible incorrect number.

## 2019-08-06 ENCOUNTER — Other Ambulatory Visit (HOSPITAL_COMMUNITY): Payer: Self-pay

## 2019-08-06 NOTE — Progress Notes (Signed)
Paramedicine Encounter    Patient ID: Richard [name], male    DOB: 05-Oct-1960, 59 y.o.   MRN: [id number]   Patient Care Team: Wallene Dales, MD as PCP - General (Family Medicine) Larey Dresser, MD as PCP - Advanced Heart Failure (Cardiology) Jorge Ny, LCSW as Social Worker (Licensed Clinical Social Worker)  Patient Active Problem List   Diagnosis Date Noted  . Acute on chronic combined systolic and diastolic CHF (congestive heart failure) ([facility]) 03/15/2018  . S/P right and left heart catheterization 11/02/2017  . Panic attacks 11/02/2017  . Non-STEMI (non-ST elevated myocardial infarction) ([facility])   . COPD (chronic obstructive pulmonary disease) ([facility])   . Hypertension   . CKD (chronic kidney disease)   . 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[facility])   . ICD (implantable cardioverter-defibrillator) in place   . GERD (gastroesophageal reflux disease)   . Diabetes mellitus with diabetic neuropathy, with long-term current use of insulin (HCC)     Current Outpatient Medications:  .  acetaminophen (TYLENOL) 650 MG CR tablet, Take 650 mg by mouth every 8 (eight) hours as needed for pain., Disp: , Rfl:  .  albuterol (PROVENTIL) (2.5 MG/3ML) 0.083% nebulizer solution, Inhale into the lungs., Disp: , Rfl:  .  allopurinol (ZYLOPRIM) 100 MG tablet, Take 1 tablet (100 mg total) by mouth daily., Disp: 30 tablet, Rfl: 3 .  aspirin 325 MG tablet, Take 325 mg by mouth daily., Disp: , Rfl:  .  atorvastatin (LIPITOR) 40 MG tablet, TAKE 1 TABLET(40 MG) BY MOUTH DAILY AT 6 PM, Disp: 90 tablet, Rfl: 3 .  bisacodyl (DULCOLAX) 5 MG EC tablet, Take 5 mg by mouth daily as needed for moderate constipation., Disp: , Rfl:  .  bisoprolol (ZEBETA) 5 MG tablet, Take 0.5 tablets (2.5 mg total) by mouth daily., Disp: 15 tablet, Rfl: 11 .  BREO ELLIPTA 100-25 MCG/INH AEPB, Inhale 1 puff into the lungs daily., Disp: , Rfl: 5 .  digoxin (LANOXIN) 0.125 MG tablet, TAKE 1 TABLET(0.125 MG) BY MOUTH DAILY, Disp: 30 tablet, Rfl: 3 .   DULoxetine (CYMBALTA) 60 MG capsule, Take 60 mg by mouth daily., Disp: , Rfl: 2 .  eplerenone (INSPRA) 50 MG tablet, Take 1 tablet (50 mg total) by mouth daily., Disp: 30 tablet, Rfl: 5 .  hydrALAZINE (APRESOLINE) 25 MG tablet, Take 1 tablet (25 mg total) by mouth 3 (three) times daily., Disp: 270 tablet, Rfl: 3 .  Insulin Pen Needle (NOVOFINE) 30G X 8 MM MISC, Inject 10 each into the skin as needed., Disp: 100 each, Rfl: 0 .  insulin regular human CONCENTRATED (HUMULIN R U-500 KWIKPEN) 500 UNIT/ML kwikpen, USE AS DIRECTED PER SLIDING SCALE. MAX DAILY DOSE IS 400 UNITS., Disp: , Rfl:  .  isosorbide dinitrate (ISORDIL) 20 MG tablet, Take 1 tablet (20 mg total) by mouth 3 (three) times daily., Disp: 90 tablet, Rfl: 10 .  JARDIANCE 25 MG TABS tablet, Take 25 mg by mouth daily., Disp: , Rfl:  .  MAGNESIUM-OXIDE 400 (241.3 Mg) MG tablet, Take 400 mg by mouth daily., Disp: , Rfl: 11 .  omeprazole (PRILOSEC) 20 MG capsule, Take 20 mg by mouth daily., Disp: , Rfl: 1 .  potassium chloride SA (K-DUR) 20 MEQ tablet, TAKE 1 TABLET(20 MEQ) BY MOUTH DAILY, Disp: 90 tablet, Rfl: 3 .  pregabalin (LYRICA) 100 MG capsule, Take 1 capsule (100 mg total) by mouth 2 (two) times daily., Disp: 60 capsule, Rfl: 3 .  sacubitril-valsartan (ENTRESTO) 97-103 MG, Take 1 tablet by  mouth 2 (two) times daily., Disp: 180 tablet, Rfl: 3 .  sennosides-docusate sodium (SENOKOT-S) 8.6-50 MG tablet, Take 1 tablet by mouth daily., Disp: , Rfl:  .  torsemide (DEMADEX) 20 MG tablet, Take 2 tablets (40 mg total) by mouth 2 (two) times daily., Disp: 360 tablet, Rfl: 3 .  Vitamin D, Cholecalciferol, 1000 units CAPS, Take 1,000 Units by mouth daily., Disp: , Rfl: 11 .  baclofen (LIORESAL) 10 MG tablet, Take 1 tablet (10 mg total) by mouth 3 (three) times daily. (Patient not taking: Reported on 08/06/2019), Disp: 30 each, Rfl: 0 .  diphenhydrAMINE (BENADRYL) 25 MG tablet, Take 25 mg by mouth at bedtime as needed., Disp: , Rfl:  .  ranitidine  (ZANTAC) 150 MG tablet, Take 150 mg by mouth 2 (two) times daily., Disp: , Rfl:  Allergies  Allergen Reactions  . Codeine   . Coconut Oil Itching      Social History   Socioeconomic History  . Marital status: Divorced    Spouse name: Not on file  . Number of children: Not on file  . Years of education: Not on file  . Highest education level: Not on file  Occupational History  . Occupation: disability    Comment: stopped working in 2000 d/t occupational exposures  Tobacco Use  . Smoking status: Former Smoker    Types: Cigarettes  . Smokeless tobacco: Never Used  . Tobacco comment: "smoked when I drank"  Substance and Sexual Activity  . 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 Drug use: Never  . Sexual activity: Not Currently  Other Topics Concern  . Not on file  Social History Narrative   Patient lives at home with wife and some of his 10 children. He self-administers his own medications.   Social Determinants of Health   Financial Resource Strain:   . Difficulty of Paying Living Expenses: Not on file  Food Insecurity:   . Worried About Programme researcher, broadcasting/film/video in the Last Year: Not on file  . Ran Out of Food in the Last Year: Not on file  Transportation Needs:   . Lack of Transportation (Medical): Not on file  . Lack of Transportation (Non-Medical): Not on file  Physical Activity:   . Days of Exercise per Week: Not on file  . Minutes of Exercise per Session: Not on file  Stress:   . Feeling of Stress : Not on file  Social Connections:   . Frequency of Communication with Friends and Family: Not on file  . Frequency of Social Gatherings with Friends and Family: Not on file  . Attends Religious Services: Not on file  . Active Member of Clubs or Organizations: Not on file  . Attends Banker Meetings: Not on file  . Marital Status: Not on file  Intimate Partner Violence:   . Fear of Current or  Ex-Partner: Not on file  . Emotionally Abused: Not on file  . Physically Abused: Not on file  . Sexually Abused: Not on file    Physical Exam Cardiovascular:     Rate and Rhythm: Normal rate and regular rhythm.     Pulses: Normal pulses.  Pulmonary:     Effort: Pulmonary effort is normal.     Breath sounds: Normal breath sounds.  Musculoskeletal:        General: Normal range of motion.     Right lower leg: No edema.  Left lower leg: No edema.  Skin:    General: Skin is warm and dry.     Capillary Refill: Capillary refill takes less than 2 seconds.  Neurological:     Mental Status: He is alert and oriented to person, place, and time.  Psychiatric:        Mood and Affect: Mood normal.         Future Appointments  Date Time Provider Department Center  08/07/2019  1:00 PM [facility] LAB [facility]  08/07/2019  3:00 PM [facility] PA/NP [facility] None  09/02/2019  8:10 AM [facility] DEVICE REMOTES [facility]  12/02/2019  8:10 AM [facility] DEVICE REMOTES [facility]  03/02/2020  8:10 AM [facility] DEVICE REMOTES [facility]  06/01/2020  8:10 AM [facility] DEVICE REMOTES [facility]    BP 90/62 (BP Location: Right Arm, Patient Position: Sitting, Cuff Size: Normal)   Pulse 80   Resp 16   Wt 249 lb (112.9 kg)   SpO2 97%   BMI 29.53 kg/m   Weight yesterday- 248 lb Last visit weight- 255 lb  Mr Judice was seen at home today and reported feeling well. He denied chest pain, SOB, headache, dizziness, orthopnea, fever or cough since our last visit. He stated he has been compliant with his medications over the past week and his weight has been stable. His medications were verified and his pillbox was refilled. He has an appointment tomorrow at the HF clinic so I will follow up there to see if changes are necessary.   [name], EMT 08/06/19  ACTION: Home visit completed Next visit planned for tomorrow at the HF  clinic

## 2019-08-07 ENCOUNTER — Ambulatory Visit (HOSPITAL_COMMUNITY): Payer: Medicare Other

## 2019-08-07 ENCOUNTER — Ambulatory Visit (HOSPITAL_COMMUNITY)
Admission: RE | Admit: 2019-08-07 | Discharge: 2019-08-07 | Disposition: A | Discharge status: Home / Self Care | Payer: Medicare Other | Source: Ambulatory Visit | Attending: Cardiology | Admitting: Cardiology

## 2019-08-07 ENCOUNTER — Encounter (HOSPITAL_COMMUNITY): Payer: Self-pay

## 2019-08-07 ENCOUNTER — Other Ambulatory Visit (HOSPITAL_COMMUNITY): Payer: Self-pay | Admitting: *Deleted

## 2019-08-07 ENCOUNTER — Other Ambulatory Visit: Payer: Self-pay

## 2019-08-07 ENCOUNTER — Other Ambulatory Visit (HOSPITAL_COMMUNITY): Payer: Self-pay

## 2019-08-07 VITALS — BP 98/60 | HR 91 | Wt 256.7 lb

## 2019-08-07 DIAGNOSIS — Z833 Family history of diabetes mellitus: Secondary | ICD-10-CM | POA: Insufficient documentation

## 2019-08-07 DIAGNOSIS — I2510 Atherosclerotic heart disease of native coronary artery without angina pectoris: Secondary | ICD-10-CM | POA: Diagnosis not present

## 2019-08-07 DIAGNOSIS — Z955 Presence of coronary angioplasty implant and graft: Secondary | ICD-10-CM | POA: Insufficient documentation

## 2019-08-07 DIAGNOSIS — Z7951 Long term (current) use of inhaled steroids: Secondary | ICD-10-CM | POA: Diagnosis not present

## 2019-08-07 DIAGNOSIS — K219 Gastro-esophageal reflux disease without esophagitis: Secondary | ICD-10-CM | POA: Insufficient documentation

## 2019-08-07 DIAGNOSIS — G4733 Obstructive sleep apnea (adult) (pediatric): Secondary | ICD-10-CM | POA: Diagnosis not present

## 2019-08-07 DIAGNOSIS — R0600 Dyspnea, unspecified: Secondary | ICD-10-CM

## 2019-08-07 DIAGNOSIS — Z8249 Family history of ischemic heart disease and other diseases of the circulatory system: Secondary | ICD-10-CM | POA: Diagnosis not present

## 2019-08-07 DIAGNOSIS — Z885 Allergy status to narcotic agent status: Secondary | ICD-10-CM | POA: Insufficient documentation

## 2019-08-07 DIAGNOSIS — I130 Hypertensive heart and chronic kidney disease with heart failure and stage 1 through stage 4 chronic kidney disease, or unspecified chronic kidney disease: Secondary | ICD-10-CM | POA: Insufficient documentation

## 2019-08-07 DIAGNOSIS — N183 Chronic kidney disease, stage 3 unspecified: Secondary | ICD-10-CM | POA: Insufficient documentation

## 2019-08-07 DIAGNOSIS — Z95810 Presence of automatic (implantable) cardiac defibrillator: Secondary | ICD-10-CM | POA: Insufficient documentation

## 2019-08-07 DIAGNOSIS — Z87891 Personal history of nicotine dependence: Secondary | ICD-10-CM | POA: Diagnosis not present

## 2019-08-07 DIAGNOSIS — Z79899 Other long term (current) drug therapy: Secondary | ICD-10-CM | POA: Insufficient documentation

## 2019-08-07 DIAGNOSIS — Z7982 Long term (current) use of aspirin: Secondary | ICD-10-CM | POA: Diagnosis not present

## 2019-08-07 DIAGNOSIS — I5043 Acute on chronic combined systolic (congestive) and diastolic (congestive) heart failure: Secondary | ICD-10-CM

## 2019-08-07 DIAGNOSIS — D751 Secondary polycythemia: Secondary | ICD-10-CM | POA: Diagnosis not present

## 2019-08-07 DIAGNOSIS — E785 Hyperlipidemia, unspecified: Secondary | ICD-10-CM | POA: Diagnosis not present

## 2019-08-07 DIAGNOSIS — I5022 Chronic systolic (congestive) heart failure: Secondary | ICD-10-CM | POA: Diagnosis not present

## 2019-08-07 DIAGNOSIS — I5042 Chronic combined systolic (congestive) and diastolic (congestive) heart failure: Secondary | ICD-10-CM | POA: Diagnosis not present

## 2019-08-07 DIAGNOSIS — E1122 Type 2 diabetes mellitus with diabetic chronic kidney disease: Secondary | ICD-10-CM | POA: Insufficient documentation

## 2019-08-07 DIAGNOSIS — I428 Other cardiomyopathies: Secondary | ICD-10-CM | POA: Insufficient documentation

## 2019-08-07 DIAGNOSIS — J449 Chronic obstructive pulmonary disease, unspecified: Secondary | ICD-10-CM | POA: Insufficient documentation

## 2019-08-07 DIAGNOSIS — Z794 Long term (current) use of insulin: Secondary | ICD-10-CM | POA: Insufficient documentation

## 2019-08-07 DIAGNOSIS — E11621 Type 2 diabetes mellitus with foot ulcer: Secondary | ICD-10-CM | POA: Insufficient documentation

## 2019-08-07 DIAGNOSIS — Z9981 Dependence on supplemental oxygen: Secondary | ICD-10-CM | POA: Diagnosis not present

## 2019-08-07 DIAGNOSIS — R0609 Other forms of dyspnea: Secondary | ICD-10-CM

## 2019-08-07 LAB — BASIC METABOLIC PANEL
Anion gap: 12 (ref 5–15)
BUN: 32 mg/dL — ABNORMAL HIGH (ref 6–20)
CO2: 29 mmol/L (ref 22–32)
Calcium: 8.8 mg/dL — ABNORMAL LOW (ref 8.9–10.3)
Chloride: 100 mmol/L (ref 98–111)
Creatinine, Ser: 1.56 mg/dL — ABNORMAL HIGH (ref 0.61–1.24)
GFR calc Af Amer: 56 mL/min — ABNORMAL LOW (ref >60)
GFR calc non Af Amer: 49 mL/min — ABNORMAL LOW (ref >60)
Glucose, Bld: 129 mg/dL — ABNORMAL HIGH (ref 70–99)
Potassium: 5.1 mmol/L (ref 3.5–5.1)
Sodium: 141 mmol/L (ref 135–145)

## 2019-08-07 LAB — BRAIN NATRIURETIC PEPTIDE: B Natriuretic Peptide: 157.1 pg/mL — ABNORMAL HIGH (ref 0.0–100.0)

## 2019-08-07 NOTE — Progress Notes (Signed)
Paramedicine Encounter   Patient ID: Richard [name] , male,   DOB: Apr 10, 1961,58 y.o.,  MRN: [id number]  Richard [name] was seen in the clinic today with Tonye Becket, NP, and reported feeling generally well. Per Amy, he is to stop bisoprolol but all other medications will remain the same. He and his wife were made aware if this change and advised that it was the only half tablet in the pillbox. The were understanding and advised they would be able to remove it from his box when they get home tonight. I will follow up next week.   [name], EMT 08/07/2019   ACTION: Next visit planned for 1 week

## 2019-08-07 NOTE — Progress Notes (Signed)
Advanced [facility] Clinic Note   PCP: Ananias Pilgrim, MD Cardiologist: Dr Shirlee Latch  HPI: Richard [name] is a 59 y.o. male with a history of chronic systolic HF s/p Medtronic ICD (12/2016), HTN, DM, and HLD.   Admitted 5/8-5/14/19 with A/C systolic HF. Echo showed EF 15-20%. Advanced HF team was consulted. He diuresed 20 lbs with IV lasix, then transitioned to torsemide 40 mg daily. Underwent R/LHC showing a focal severe RCA stenosis treated with DES.  He did not, however, appear to have severe enough coronary disease to explain his cardiomyopathy. Cardiac index was low at 1.9.  HF meds were optimized. Beta blocker was not started due to low CI and soft BPs. He was discharged on home oxygen. Referred to cardiac rehab. DC weight: 260 lbs.   Echo in 2/20 showed EF 20-25% with severe LV dilation, moderate RV enlargement with moderately decreased RV systolic function.   Today he returns for HF follow up.On 07/18/19 he was seen by Dr Shirlee Latch. At that time he had not been taking his medications and had been out torsemide. He has since restarted. Overall feeling a little better. He remains SOB with exertion.  Denies PND/Orthopnea. Appetite ok. No fever or chills. Weight at home has gone down to from 255--->248 pounds. Taking all medications. Per HF Paramedic he has been taking his medicaiton this past week. He no longer smokers.  Followed by HF paramedicine.   Labs (10/19): K 3.9, creatinine 1.18  Labs (10/20): BNP 160, K 3.8, creatinine 1.31 Las (07/25/19): K 3.7 Creatinine 1.22 Dig < 0.2   PMH: 1. Chronic systolic CHF: Primarily nonischemic cardiomyopathy.  Has Medtronic ICD.   - Echo 5/19: EF 15-20%, grade 2 DD, mild MR, RV severely reduced, LA moderately dilated, RA mildly dilated, mild TR.  - RHC/LHC (5/19): 80% distal LAD stenosis, 80% proximal RCA stenosis treated with DES to pRCA. Mean RA 13, PA 69/31 mean 47, mean 24, CI 1.91, PVR 4.8 WU.  - Gynecomastia with spironolactone. - Echo (2/20): EF  20-25%, severe LV enlargement, moderate RV enlargement, moderately decreased RV systolic function.  2. COPD: Uses oxygen at night. Never smoked, but apparently had significant occupational exposure. It appears that he won a lawsuit dealing with the occupational exposure-related COPD.  3. CAD: Cath in 5/17 with 60% RCA stenosis.  - LHC (5/19): 80% distal LAD, 80% proximal RCA.  DES to proximal RCA.  4. Polycythemia: Probably due to chronic hypoxemia.  5. Type II diabetes.  6. HTN 7. GERD 8. CKD: Stage 3.  Likely due to diabetes and HTN.  9. OSA: Unable to tolerate CPAP.   Review of systems complete and found to be negative unless listed in HPI.    Current Outpatient Medications  Medication Sig Dispense Refill  . acetaminophen (TYLENOL) 650 MG CR tablet Take 650 mg by mouth every 8 (eight) hours as needed for pain.    Marland Kitchen albuterol (PROVENTIL) (2.5 MG/3ML) 0.083% nebulizer solution Inhale into the lungs.    Marland Kitchen allopurinol (ZYLOPRIM) 100 MG tablet Take 1 tablet (100 mg total) by mouth daily. 30 tablet 3  . aspirin 325 MG tablet Take 325 mg by mouth daily.    Marland Kitchen atorvastatin (LIPITOR) 40 MG tablet TAKE 1 TABLET(40 MG) BY MOUTH DAILY AT 6 PM 90 tablet 3  . baclofen (LIORESAL) 10 MG tablet Take 1 tablet (10 mg total) by mouth 3 (three) times daily. 30 each 0  . bisacodyl (DULCOLAX) 5 MG EC tablet Take 5 mg by mouth daily  as needed for moderate constipation.    . bisoprolol (ZEBETA) 5 MG tablet Take 0.5 tablets (2.5 mg total) by mouth daily. 15 tablet 11  . BREO ELLIPTA 100-25 MCG/INH AEPB Inhale 1 puff into the lungs daily.  5  . digoxin (LANOXIN) 0.125 MG tablet TAKE 1 TABLET(0.125 MG) BY MOUTH DAILY 30 tablet 3  . diphenhydrAMINE (BENADRYL) 25 MG tablet Take 25 mg by mouth at bedtime as needed.    . DULoxetine (CYMBALTA) 60 MG capsule Take 60 mg by mouth daily.  2  . eplerenone (INSPRA) 50 MG tablet Take 1 tablet (50 mg total) by mouth daily. 30 tablet 5  . hydrALAZINE (APRESOLINE) 25 MG tablet  Take 1 tablet (25 mg total) by mouth 3 (three) times daily. 270 tablet 3  . Insulin Pen Needle (NOVOFINE) 30G X 8 MM MISC Inject 10 each into the skin as needed. 100 each 0  . insulin regular human CONCENTRATED (HUMULIN R U-500 KWIKPEN) 500 UNIT/ML kwikpen USE AS DIRECTED PER SLIDING SCALE. MAX DAILY DOSE IS 400 UNITS.    Marland Kitchen isosorbide dinitrate (ISORDIL) 20 MG tablet Take 1 tablet (20 mg total) by mouth 3 (three) times daily. 90 tablet 10  . JARDIANCE 25 MG TABS tablet Take 25 mg by mouth daily.    Marland Kitchen MAGNESIUM-OXIDE 400 (241.3 Mg) MG tablet Take 400 mg by mouth daily.  11  . omeprazole (PRILOSEC) 20 MG capsule Take 20 mg by mouth daily.  1  . potassium chloride SA (K-DUR) 20 MEQ tablet TAKE 1 TABLET(20 MEQ) BY MOUTH DAILY 90 tablet 3  . pregabalin (LYRICA) 100 MG capsule Take 1 capsule (100 mg total) by mouth 2 (two) times daily. 60 capsule 3  . ranitidine (ZANTAC) 150 MG tablet Take 150 mg by mouth 2 (two) times daily.    . sacubitril-valsartan (ENTRESTO) 97-103 MG Take 1 tablet by mouth 2 (two) times daily. 180 tablet 3  . sennosides-docusate sodium (SENOKOT-S) 8.6-50 MG tablet Take 1 tablet by mouth daily.    Marland Kitchen torsemide (DEMADEX) 20 MG tablet Take 2 tablets (40 mg total) by mouth 2 (two) times daily. 360 tablet 3  . Vitamin D, Cholecalciferol, 1000 units CAPS Take 1,000 Units by mouth daily.  11   No current facility-administered medications for this encounter.   Allergies  Allergen Reactions  . Codeine   . Coconut Oil Itching    Social History   Socioeconomic History  . Marital status: Divorced    Spouse name: Not on file  . Number of children: Not on file  . Years of education: Not on file  . Highest education level: Not on file  Occupational History  . Occupation: disability    Comment: stopped working in 2000 d/t occupational exposures  Tobacco Use  . Smoking status: Former Smoker    Types: Cigarettes  . Smokeless tobacco: Never Used  . Tobacco comment: "smoked when I  drank"  Substance and Sexual Activity  . 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 Drug use: Never  . Sexual activity: Not Currently  Other Topics Concern  . Not on file  Social History Narrative   Patient lives at home with wife and some of his 10 children. He self-administers his own medications.   Social Determinants of Health   Financial Resource Strain:   . Difficulty of Paying Living Expenses: Not on file  Food Insecurity:   . Worried About Estate manager/land agent  of Food in the Last Year: Not on file  . Ran Out of Food in the Last Year: Not on file  Transportation Needs:   . Lack of Transportation (Medical): Not on file  . Lack of Transportation (Non-Medical): Not on file  Physical Activity:   . Days of Exercise per Week: Not on file  . Minutes of Exercise per Session: Not on file  Stress:   . Feeling of Stress : Not on file  Social Connections:   . Frequency of Communication with Friends and Family: Not on file  . Frequency of Social Gatherings with Friends and Family: Not on file  . Attends Religious Services: Not on file  . Active Member of Clubs or Organizations: Not on file  . Attends Banker Meetings: Not on file  . Marital Status: Not on file  Intimate Partner Violence:   . Fear of Current or Ex-Partner: Not on file  . Emotionally Abused: Not on file  . Physically Abused: Not on file  . Sexually Abused: Not on file   Family History  Problem Relation Age of Onset  . Hypertension Mother   . Diabetes Mother   . Hypertension Father   . Diabetes Father   . Diabetes Sister   . Diabetes Brother    Vitals:   08/07/19 1503  BP: 98/60  Pulse: 91  SpO2: (!) 89%  Weight: 116.4 kg (256 lb 11.2 oz)     Wt Readings from Last 3 Encounters:  08/07/19 116.4 kg (256 lb 11.2 oz)  08/06/19 112.9 kg (249 lb)  07/30/19 115.7 kg (255 lb)   PHYSICAL EXAM: General:  Walked in the clinic. No resp  difficulty HEENT: normal Neck: supple. no JVD. Carotids 2+ bilat; no bruits. No lymphadenopathy or thryomegaly appreciated. Cor: PMI nondisplaced. Regular rate & rhythm. No rubs, gallops or murmurs. Lungs: clear Abdomen: soft, nontender, distended. No hepatosplenomegaly. No bruits or masses. Good bowel sounds. Extremities: no cyanosis, clubbing, rash, edema Neuro: alert & orientedx3, cranial nerves grossly intact. moves all 4 extremities w/o difficulty. Affect pleasant  ASSESSMENT & PLAN:  1. Chronic systolic CHF:  Primarily nonischemic cardiomyopathy. Echo in 2017 with EF 15-20%. Medtronic ICD. Echo 10/2017 EF 15-20%, severely dilated RV with severely decreased systolic function. LHC/RHC 11/06/17 showed volume overload with 80% RCA stenosis.  Cardiac index low at 1.91. The degree of coronary disease does not explain his cardiomyopathy.  Echo in 2/20 showed severe LV dilation, EF 20-25%, moderately decreased RV systolic function.  NYHA III. Volume status stable.   -Continue torsemide to 40 mg bid.   - Continue entresto 49/51 mg BID.  - Continue  eplerenone to 50 mg daily.   - Continue hydralazine 25 mg tid and isordil to 20 tid.   - Continue digoxin 0.125 mg daily, check level today.  - SBP low. Stop bisoprolol 2.5 mg daily.  - Continue empagliflozin.  -CPX today.  - He may be a future LVAD candidate.   -Check BMET today.     2. COPD: Never smoked, but apparently had significant occupational exposure. It appears that he won a lawsuit dealing with the occupational exposure-related COPD.  3. CAD:  LHC in 5/19 with 80-90% proximal RCA stenosis treated with DES to RCA.  This did not cause his cardiomyopathy but is a large RCA.  No chest pain .  - Continue ASA 81 daily.  - Continue atorvastatin.  4. Polycythemia: Likely related to chronic hypoxia.  5. OSA: Cannot tolerate CPAP, wears oxygen  at night.  6. DM: He is on empagliflozin.   7. Ulcer: Slow to heal foot ulcer.  He had peripheral  arterial dopplers arranged by PCP, result is not available yet.   Reinforced medication compliance. Check BMET today. Continue Paramedicine.  Adorian Gwynne NP-C  08/07/2019

## 2019-08-07 NOTE — Patient Instructions (Signed)
Stop Bisoprolol   Labs done today, we will contact you for abnormal results  Your physician recommends that you schedule a follow-up appointment in: 2 weeks with Tonye Becket, NP  Your physician recommends that you schedule a follow-up appointment in: 6 weeks with Dr Shirlee Latch  At the [facility], you and your health needs are our priority. As part of our continuing mission to provide you with exceptional heart care, we have created designated Provider Care Teams. These Care Teams include your primary Cardiologist (physician) and Advanced Practice Providers (APPs- Physician Assistants and Nurse Practitioners) who all work together to provide you with the care you need, when you need it.   You may see any of the following providers on your designated Care Team at your next follow up: Marland Kitchen Dr Arvilla Meres . Dr Marca Ancona . Tonye Becket, NP . Robbie Lis, PA . Karle Plumber, PharmD   Please be sure to bring in all your medications bottles to every appointment.

## 2019-08-08 ENCOUNTER — Telehealth (HOSPITAL_COMMUNITY): Payer: Self-pay

## 2019-08-08 DIAGNOSIS — I5022 Chronic systolic (congestive) heart failure: Secondary | ICD-10-CM

## 2019-08-08 DIAGNOSIS — I5042 Chronic combined systolic (congestive) and diastolic (congestive) heart failure: Secondary | ICD-10-CM

## 2019-08-08 NOTE — Telephone Encounter (Signed)
Pt and wife aware of results of CPX. Pt sees Dr Lottie Rater at [facility] pulmonology.  They will continue to follow up with them. Pt also aware of lab results and to stop KCL. Pt will rtc in 1 week to repeat bmet

## 2019-08-08 NOTE — Telephone Encounter (Signed)
-----   Message from Laurey Morale, MD sent at 08/07/2019  8:36 PM EST ----- Very severe pulmonary limitation from obstructive lung disease.  Also cardiac limitation but less profound.  He needs to be followed by a pulmonologist, if he does not see one would refer to [facility].

## 2019-08-08 NOTE — Telephone Encounter (Signed)
-----   Message from Laurey Morale, MD sent at 08/07/2019  8:37 PM EST ----- Stop KCl.  Repeat BMET 1 week.

## 2019-08-13 ENCOUNTER — Other Ambulatory Visit (HOSPITAL_COMMUNITY): Payer: Self-pay

## 2019-08-13 MED ORDER — MAGNESIUM-OXIDE 400 (241.3 MG) MG PO TABS: 400.0000 mg | ORAL_TABLET | Freq: Every day | ORAL | 11 refills | Status: DC

## 2019-08-13 MED ORDER — ISOSORBIDE DINITRATE 20 MG PO TABS: 20.0000 mg | ORAL_TABLET | Freq: Three times a day (TID) | ORAL | 11 refills | Status: DC

## 2019-08-13 NOTE — Telephone Encounter (Signed)
Received call from Richard [name], pt needs refills for isordil and mag ox. Refills sent to pharm. Pt requesting cancellation of lab appt this week. Unable to make it. Pt has office f/u next week. Will do labs at appt.

## 2019-08-13 NOTE — Progress Notes (Signed)
Paramedicine Encounter    Patient ID: Richard [name], male    DOB: [birth year], 59 y.o.   MRN: [id number]   Patient Care Team: Ananias Pilgrim, MD as PCP - General (Family Medicine) Laurey Morale, MD as PCP - Advanced Heart Failure (Cardiology) Burna Sis, LCSW as Social Worker (Licensed Clinical Social Worker)  Patient Active Problem List   Diagnosis Date Noted  . Acute on chronic combined systolic and diastolic CHF (congestive heart failure) (HCC) 03/15/2018  . S/P right and left heart catheterization 11/02/2017  . Panic attacks 11/02/2017  . Non-STEMI (non-ST elevated myocardial infarction) (HCC)   . COPD (chronic obstructive pulmonary disease) (HCC)   . Hypertension   . CKD (chronic kidney disease)   . 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HCC)   . ICD (implantable cardioverter-defibrillator) in place   . GERD (gastroesophageal reflux disease)   . Diabetes mellitus with diabetic neuropathy, with long-term current use of insulin (HCC)     Current Outpatient Medications:  .  acetaminophen (TYLENOL) 650 MG CR tablet, Take 650 mg by mouth every 8 (eight) hours as needed for pain., Disp: , Rfl:  .  albuterol (PROVENTIL) (2.5 MG/3ML) 0.083% nebulizer solution, Inhale into the lungs., Disp: , Rfl:  .  allopurinol (ZYLOPRIM) 100 MG tablet, Take 1 tablet (100 mg total) by mouth daily., Disp: 30 tablet, Rfl: 3 .  aspirin 325 MG tablet, Take 325 mg by mouth daily., Disp: , Rfl:  .  atorvastatin (LIPITOR) 40 MG tablet, TAKE 1 TABLET(40 MG) BY MOUTH DAILY AT 6 PM, Disp: 90 tablet, Rfl: 3 .  bisacodyl (DULCOLAX) 5 MG EC tablet, Take 5 mg by mouth daily as needed for moderate constipation., Disp: , Rfl:  .  BREO ELLIPTA 100-25 MCG/INH AEPB, Inhale 1 puff into the lungs daily., Disp: , Rfl: 5 .  digoxin (LANOXIN) 0.125 MG tablet, TAKE 1 TABLET(0.125 MG) BY MOUTH DAILY, Disp: 30 tablet, Rfl: 3 .  diphenhydrAMINE (BENADRYL) 25 MG tablet, Take 25 mg by mouth at bedtime as needed., Disp: , Rfl:  .   DULoxetine (CYMBALTA) 60 MG capsule, Take 60 mg by mouth daily., Disp: , Rfl: 2 .  eplerenone (INSPRA) 50 MG tablet, Take 1 tablet (50 mg total) by mouth daily., Disp: 30 tablet, Rfl: 5 .  hydrALAZINE (APRESOLINE) 25 MG tablet, Take 1 tablet (25 mg total) by mouth 3 (three) times daily., Disp: 270 tablet, Rfl: 3 .  Insulin Pen Needle (NOVOFINE) 30G X 8 MM MISC, Inject 10 each into the skin as needed., Disp: 100 each, Rfl: 0 .  insulin regular human CONCENTRATED (HUMULIN R U-500 KWIKPEN) 500 UNIT/ML kwikpen, USE AS DIRECTED PER SLIDING SCALE. MAX DAILY DOSE IS 400 UNITS., Disp: , Rfl:  .  JARDIANCE 25 MG TABS tablet, Take 25 mg by mouth daily., Disp: , Rfl:  .  omeprazole (PRILOSEC) 20 MG capsule, Take 20 mg by mouth daily., Disp: , Rfl: 1 .  pregabalin (LYRICA) 100 MG capsule, Take 1 capsule (100 mg total) by mouth 2 (two) times daily., Disp: 60 capsule, Rfl: 3 .  sacubitril-valsartan (ENTRESTO) 97-103 MG, Take 1 tablet by mouth 2 (two) times daily., Disp: 180 tablet, Rfl: 3 .  sennosides-docusate sodium (SENOKOT-S) 8.6-50 MG tablet, Take 1 tablet by mouth daily., Disp: , Rfl:  .  torsemide (DEMADEX) 20 MG tablet, Take 2 tablets (40 mg total) by mouth 2 (two) times daily., Disp: 360 tablet, Rfl: 3 .  Vitamin D, Cholecalciferol, 1000 units CAPS, Take 1,000 Units by mouth  daily., Disp: , Rfl: 11 .  baclofen (LIORESAL) 10 MG tablet, Take 1 tablet (10 mg total) by mouth 3 (three) times daily. (Patient not taking: Reported on 08/13/2019), Disp: 30 each, Rfl: 0 .  isosorbide dinitrate (ISORDIL) 20 MG tablet, Take 1 tablet (20 mg total) by mouth 3 (three) times daily., Disp: 90 tablet, Rfl: 11 .  MAGNESIUM-OXIDE 400 (241.3 Mg) MG tablet, Take 1 tablet (400 mg total) by mouth daily., Disp: 30 tablet, Rfl: 11 .  ranitidine (ZANTAC) 150 MG tablet, Take 150 mg by mouth 2 (two) times daily., Disp: , Rfl:  Allergies  Allergen Reactions  . Codeine   . Coconut Oil Itching      Social History    Socioeconomic History  . Marital status: Divorced    Spouse name: Not on file  . Number of children: Not on file  . Years of education: Not on file  . Highest education level: Not on file  Occupational History  . Occupation: disability    Comment: stopped working in 2000 d/t occupational exposures  Tobacco Use  . Smoking status: Former Smoker    Types: Cigarettes  . Smokeless tobacco: Never Used  . Tobacco comment: "smoked when I drank"  Substance and Sexual Activity  . 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 Drug use: Never  . Sexual activity: Not Currently  Other Topics Concern  . Not on file  Social History Narrative   Patient lives at home with wife and some of his 10 children. He self-administers his own medications.   Social Determinants of Health   Financial Resource Strain:   . Difficulty of Paying Living Expenses: Not on file  Food Insecurity:   . Worried About Charity fundraiser in the Last Year: Not on file  . Ran Out of Food in the Last Year: Not on file  Transportation Needs:   . Lack of Transportation (Medical): Not on file  . Lack of Transportation (Non-Medical): Not on file  Physical Activity:   . Days of Exercise per Week: Not on file  . Minutes of Exercise per Session: Not on file  Stress:   . Feeling of Stress : Not on file  Social Connections:   . Frequency of Communication with Friends and Family: Not on file  . Frequency of Social Gatherings with Friends and Family: Not on file  . Attends Religious Services: Not on file  . Active Member of Clubs or Organizations: Not on file  . Attends Archivist Meetings: Not on file  . Marital Status: Not on file  Intimate Partner Violence:   . Fear of Current or Ex-Partner: Not on file  . Emotionally Abused: Not on file  . Physically Abused: Not on file  . Sexually Abused: Not on file    Physical Exam Cardiovascular:     Rate and  Rhythm: Normal rate and regular rhythm.     Pulses: Normal pulses.  Pulmonary:     Effort: Pulmonary effort is normal.     Breath sounds: Normal breath sounds.  Musculoskeletal:        General: Normal range of motion.  Skin:    General: Skin is warm and dry.     Capillary Refill: Capillary refill takes less than 2 seconds.  Neurological:     Mental Status: He is alert and oriented to person, place, and time.  Psychiatric:  Mood and Affect: Mood normal.         Future Appointments  Date Time Provider Department Center  08/21/2019 11:00 AM [facility] PA/NP [facility] None  09/02/2019  8:10 AM [facility] DEVICE REMOTES [facility]  10/03/2019  2:40 PM Laurey Morale, MD [facility] None  12/02/2019  8:10 AM [facility] DEVICE REMOTES [facility]  03/02/2020  8:10 AM [facility] DEVICE REMOTES [facility]  06/01/2020  8:10 AM [facility] DEVICE REMOTES [facility]    BP 110/60 (BP Location: Left Arm, Patient Position: Sitting, Cuff Size: Large)   Pulse 80   Resp 18   Wt 255 lb (115.7 kg)   SpO2 96%   BMI 30.24 kg/m   Weight yesterday- did not weigh Last visit weight- 256 lb  Mr Halt was seen at home today and rpeorted feeling well. He denied chest pain, SOB, headache, dizziness, orthopnea, fever or cough since our last visit. He stated he has been compliant with his medications over the past week and his weight has been stable. His medications were verified and his pillbox was refilled. I will follow up next week.   [name], EMT 08/13/19  ACTION: Home visit completed Next visit planned for 1 week

## 2019-08-15 ENCOUNTER — Other Ambulatory Visit (HOSPITAL_COMMUNITY): Payer: Medicare Other

## 2019-08-19 ENCOUNTER — Telehealth (HOSPITAL_COMMUNITY): Payer: Self-pay

## 2019-08-19 NOTE — Telephone Encounter (Signed)
I called Richard [name] to schedule an appointment. He stated he is available tomorrow afternoon so we agreed to meet at 14:30.  [name], EMT  08/19/19

## 2019-08-20 ENCOUNTER — Other Ambulatory Visit (HOSPITAL_COMMUNITY): Payer: Self-pay

## 2019-08-20 NOTE — Progress Notes (Signed)
Paramedicine Encounter    Patient ID: Richard [name], male    DOB: 05/26/1961, 58 y.o.   MRN: [id number]   Patient Care Team: Ananias Pilgrim, MD as PCP - General (Family Medicine) Laurey Morale, MD as PCP - Advanced Heart Failure (Cardiology) Burna Sis, LCSW as Social Worker (Licensed Clinical Social Worker)  Patient Active Problem List   Diagnosis Date Noted  . Acute on chronic combined systolic and diastolic CHF (congestive heart failure) (HCC) 03/15/2018  . S/P right and left heart catheterization 11/02/2017  . Panic attacks 11/02/2017  . Non-STEMI (non-ST elevated myocardial infarction) (HCC)   . COPD (chronic obstructive pulmonary disease) (HCC)   . Hypertension   . CKD (chronic kidney disease)   . 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HCC)   . ICD (implantable cardioverter-defibrillator) in place   . GERD (gastroesophageal reflux disease)   . Diabetes mellitus with diabetic neuropathy, with long-term current use of insulin (HCC)     Current Outpatient Medications:  .  acetaminophen (TYLENOL) 650 MG CR tablet, Take 650 mg by mouth every 8 (eight) hours as needed for pain., Disp: , Rfl:  .  albuterol (PROVENTIL) (2.5 MG/3ML) 0.083% nebulizer solution, Inhale into the lungs., Disp: , Rfl:  .  allopurinol (ZYLOPRIM) 100 MG tablet, Take 1 tablet (100 mg total) by mouth daily., Disp: 30 tablet, Rfl: 3 .  aspirin 325 MG tablet, Take 325 mg by mouth daily., Disp: , Rfl:  .  atorvastatin (LIPITOR) 40 MG tablet, TAKE 1 TABLET(40 MG) BY MOUTH DAILY AT 6 PM, Disp: 90 tablet, Rfl: 3 .  bisacodyl (DULCOLAX) 5 MG EC tablet, Take 5 mg by mouth daily as needed for moderate constipation., Disp: , Rfl:  .  BREO ELLIPTA 100-25 MCG/INH AEPB, Inhale 1 puff into the lungs daily., Disp: , Rfl: 5 .  digoxin (LANOXIN) 0.125 MG tablet, TAKE 1 TABLET(0.125 MG) BY MOUTH DAILY, Disp: 30 tablet, Rfl: 3 .  DULoxetine (CYMBALTA) 60 MG capsule, Take 60 mg by mouth daily., Disp: , Rfl: 2 .  eplerenone (INSPRA) 50 MG  tablet, Take 1 tablet (50 mg total) by mouth daily., Disp: 30 tablet, Rfl: 5 .  hydrALAZINE (APRESOLINE) 25 MG tablet, Take 1 tablet (25 mg total) by mouth 3 (three) times daily., Disp: 270 tablet, Rfl: 3 .  isosorbide dinitrate (ISORDIL) 20 MG tablet, Take 1 tablet (20 mg total) by mouth 3 (three) times daily., Disp: 90 tablet, Rfl: 11 .  JARDIANCE 25 MG TABS tablet, Take 25 mg by mouth daily., Disp: , Rfl:  .  MAGNESIUM-OXIDE 400 (241.3 Mg) MG tablet, Take 1 tablet (400 mg total) by mouth daily., Disp: 30 tablet, Rfl: 11 .  omeprazole (PRILOSEC) 20 MG capsule, Take 20 mg by mouth daily., Disp: , Rfl: 1 .  pregabalin (LYRICA) 100 MG capsule, Take 1 capsule (100 mg total) by mouth 2 (two) times daily., Disp: 60 capsule, Rfl: 3 .  sacubitril-valsartan (ENTRESTO) 97-103 MG, Take 1 tablet by mouth 2 (two) times daily., Disp: 180 tablet, Rfl: 3 .  sennosides-docusate sodium (SENOKOT-S) 8.6-50 MG tablet, Take 1 tablet by mouth daily., Disp: , Rfl:  .  torsemide (DEMADEX) 20 MG tablet, Take 2 tablets (40 mg total) by mouth 2 (two) times daily., Disp: 360 tablet, Rfl: 3 .  Vitamin D, Cholecalciferol, 1000 units CAPS, Take 1,000 Units by mouth daily., Disp: , Rfl: 11 .  baclofen (LIORESAL) 10 MG tablet, Take 1 tablet (10 mg total) by mouth 3 (three) times daily. (Patient not  taking: Reported on 08/13/2019), Disp: 30 each, Rfl: 0 .  diphenhydrAMINE (BENADRYL) 25 MG tablet, Take 25 mg by mouth at bedtime as needed., Disp: , Rfl:  .  Insulin Pen Needle (NOVOFINE) 30G X 8 MM MISC, Inject 10 each into the skin as needed., Disp: 100 each, Rfl: 0 .  insulin regular human CONCENTRATED (HUMULIN R U-500 KWIKPEN) 500 UNIT/ML kwikpen, USE AS DIRECTED PER SLIDING SCALE. MAX DAILY DOSE IS 400 UNITS., Disp: , Rfl:  .  ranitidine (ZANTAC) 150 MG tablet, Take 150 mg by mouth 2 (two) times daily., Disp: , Rfl:  Allergies  Allergen Reactions  . Codeine   . Coconut Oil Itching      Social History   Socioeconomic  History  . Marital status: Divorced    Spouse name: Not on file  . Number of children: Not on file  . Years of education: Not on file  . Highest education level: Not on file  Occupational History  . Occupation: disability    Comment: stopped working in 2000 d/t occupational exposures  Tobacco Use  . Smoking status: Former Smoker    Types: Cigarettes  . Smokeless tobacco: Never Used  . Tobacco comment: "smoked when I drank"  Substance and Sexual Activity  . 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 Drug use: Never  . Sexual activity: Not Currently  Other Topics Concern  . Not on file  Social History Narrative   Patient lives at home with wife and some of his 10 children. He self-administers his own medications.   Social Determinants of Health   Financial Resource Strain:   . Difficulty of Paying Living Expenses: Not on file  Food Insecurity:   . Worried About Programme researcher, broadcasting/film/video in the Last Year: Not on file  . Ran Out of Food in the Last Year: Not on file  Transportation Needs:   . Lack of Transportation (Medical): Not on file  . Lack of Transportation (Non-Medical): Not on file  Physical Activity:   . Days of Exercise per Week: Not on file  . Minutes of Exercise per Session: Not on file  Stress:   . Feeling of Stress : Not on file  Social Connections:   . Frequency of Communication with Friends and Family: Not on file  . Frequency of Social Gatherings with Friends and Family: Not on file  . Attends Religious Services: Not on file  . Active Member of Clubs or Organizations: Not on file  . Attends Banker Meetings: Not on file  . Marital Status: Not on file  Intimate Partner Violence:   . Fear of Current or Ex-Partner: Not on file  . Emotionally Abused: Not on file  . Physically Abused: Not on file  . Sexually Abused: Not on file    Physical Exam Cardiovascular:     Rate and Rhythm: Normal rate  and regular rhythm.     Pulses: Normal pulses.  Pulmonary:     Effort: Pulmonary effort is normal.     Breath sounds: Normal breath sounds.  Musculoskeletal:        General: Normal range of motion.     Right lower leg: No edema.     Left lower leg: No edema.  Skin:    General: Skin is warm and dry.     Capillary Refill: Capillary refill takes less than 2 seconds.  Neurological:     Mental  Status: He is alert and oriented to person, place, and time.  Psychiatric:        Mood and Affect: Mood normal.         Future Appointments  Date Time Provider [place]  08/21/2019 11:00 AM [facility] PA/NP [facility] None  09/02/2019  8:10 AM [facility] DEVICE REMOTES [facility]  10/03/2019  2:40 PM Larey Dresser, MD [facility] None  12/02/2019  8:10 AM [facility] DEVICE REMOTES [facility]  03/02/2020  8:10 AM [facility] DEVICE REMOTES [facility]  06/01/2020  8:10 AM [facility] DEVICE REMOTES [facility]    BP (!) 130/100 (BP Location: Left Arm, Patient Position: Sitting, Cuff Size: Normal)   Pulse 88   Resp 16   Wt 257 lb 12.8 oz (116.9 kg)   SpO2 97%   BMI 30.57 kg/m   Weight yesterday- did not weigh Last visit weight- 255 lb  Mr Cohron was seen at home today and rpeorted feeling well. He denied chest pain, SOB, headache, dizziness, orthopnea, fever or cough since our last visit. He stated he has been compliant with his medications which appeared to be mostly true but he had missed an afternoon dose earlier in the week and had taken no medications today because he had yet to eat anything. His medications were verified and his pillbox was refilled. I will follow up tomorrow at his clinic appointment.   Jacquiline Doe, EMT 08/20/19  ACTION: Home visit completed Next visit planned for tomorrow

## 2019-08-21 ENCOUNTER — Encounter (HOSPITAL_COMMUNITY): Payer: Medicare Other

## 2019-08-27 ENCOUNTER — Other Ambulatory Visit (HOSPITAL_COMMUNITY): Payer: Self-pay

## 2019-08-27 NOTE — Progress Notes (Signed)
Paramedicine Encounter    Patient ID: Richard [name], male    DOB: [birth year], 59 y.o.   MRN: [id number]   Patient Care Team: Ananias Pilgrim, MD as PCP - General (Family Medicine) Laurey Morale, MD as PCP - Advanced Heart Failure (Cardiology) Burna Sis, LCSW as Social Worker (Licensed Clinical Social Worker)  Patient Active Problem List   Diagnosis Date Noted  . Acute on chronic combined systolic and diastolic CHF (congestive heart failure) (HCC) 03/15/2018  . S/P right and left heart catheterization 11/02/2017  . Panic attacks 11/02/2017  . Non-STEMI (non-ST elevated myocardial infarction) (HCC)   . COPD (chronic obstructive pulmonary disease) (HCC)   . Hypertension   . CKD (chronic kidney disease)   . 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HCC)   . ICD (implantable cardioverter-defibrillator) in place   . GERD (gastroesophageal reflux disease)   . Diabetes mellitus with diabetic neuropathy, with long-term current use of insulin (HCC)     Current Outpatient Medications:  .  acetaminophen (TYLENOL) 650 MG CR tablet, Take 650 mg by mouth every 8 (eight) hours as needed for pain., Disp: , Rfl:  .  albuterol (PROVENTIL) (2.5 MG/3ML) 0.083% nebulizer solution, Inhale into the lungs., Disp: , Rfl:  .  allopurinol (ZYLOPRIM) 100 MG tablet, Take 1 tablet (100 mg total) by mouth daily., Disp: 30 tablet, Rfl: 3 .  atorvastatin (LIPITOR) 40 MG tablet, TAKE 1 TABLET(40 MG) BY MOUTH DAILY AT 6 PM, Disp: 90 tablet, Rfl: 3 .  BREO ELLIPTA 100-25 MCG/INH AEPB, Inhale 1 puff into the lungs daily., Disp: , Rfl: 5 .  digoxin (LANOXIN) 0.125 MG tablet, TAKE 1 TABLET(0.125 MG) BY MOUTH DAILY, Disp: 30 tablet, Rfl: 3 .  diphenhydrAMINE (BENADRYL) 25 MG tablet, Take 25 mg by mouth at bedtime as needed., Disp: , Rfl:  .  eplerenone (INSPRA) 50 MG tablet, Take 1 tablet (50 mg total) by mouth daily., Disp: 30 tablet, Rfl: 5 .  hydrALAZINE (APRESOLINE) 25 MG tablet, Take 1 tablet (25 mg total) by mouth 3 (three)  times daily., Disp: 270 tablet, Rfl: 3 .  Insulin Pen Needle (NOVOFINE) 30G X 8 MM MISC, Inject 10 each into the skin as needed., Disp: 100 each, Rfl: 0 .  insulin regular human CONCENTRATED (HUMULIN R U-500 KWIKPEN) 500 UNIT/ML kwikpen, USE AS DIRECTED PER SLIDING SCALE. MAX DAILY DOSE IS 400 UNITS., Disp: , Rfl:  .  isosorbide dinitrate (ISORDIL) 20 MG tablet, Take 1 tablet (20 mg total) by mouth 3 (three) times daily., Disp: 90 tablet, Rfl: 11 .  JARDIANCE 25 MG TABS tablet, Take 25 mg by mouth daily., Disp: , Rfl:  .  MAGNESIUM-OXIDE 400 (241.3 Mg) MG tablet, Take 1 tablet (400 mg total) by mouth daily., Disp: 30 tablet, Rfl: 11 .  omeprazole (PRILOSEC) 20 MG capsule, Take 20 mg by mouth daily., Disp: , Rfl: 1 .  pregabalin (LYRICA) 100 MG capsule, Take 1 capsule (100 mg total) by mouth 2 (two) times daily., Disp: 60 capsule, Rfl: 3 .  sacubitril-valsartan (ENTRESTO) 97-103 MG, Take 1 tablet by mouth 2 (two) times daily., Disp: 180 tablet, Rfl: 3 .  sennosides-docusate sodium (SENOKOT-S) 8.6-50 MG tablet, Take 1 tablet by mouth daily., Disp: , Rfl:  .  torsemide (DEMADEX) 20 MG tablet, Take 2 tablets (40 mg total) by mouth 2 (two) times daily., Disp: 360 tablet, Rfl: 3 .  Vitamin D, Cholecalciferol, 1000 units CAPS, Take 1,000 Units by mouth daily., Disp: , Rfl: 11 .  aspirin 325  MG tablet, Take 325 mg by mouth daily., Disp: , Rfl:  .  baclofen (LIORESAL) 10 MG tablet, Take 1 tablet (10 mg total) by mouth 3 (three) times daily. (Patient not taking: Reported on 08/13/2019), Disp: 30 each, Rfl: 0 .  bisacodyl (DULCOLAX) 5 MG EC tablet, Take 5 mg by mouth daily as needed for moderate constipation., Disp: , Rfl:  .  DULoxetine (CYMBALTA) 60 MG capsule, Take 60 mg by mouth daily., Disp: , Rfl: 2 .  ranitidine (ZANTAC) 150 MG tablet, Take 150 mg by mouth 2 (two) times daily., Disp: , Rfl:  Allergies  Allergen Reactions  . Codeine   . Coconut Oil Itching      Social History   Socioeconomic  History  . Marital status: Divorced    Spouse name: Not on file  . Number of children: Not on file  . Years of education: Not on file  . Highest education level: Not on file  Occupational History  . Occupation: disability    Comment: stopped working in 2000 d/t occupational exposures  Tobacco Use  . Smoking status: Former Smoker    Types: Cigarettes  . Smokeless tobacco: Never Used  . Tobacco comment: "smoked when I drank"  Substance and Sexual Activity  . 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 Drug use: Never  . Sexual activity: Not Currently  Other Topics Concern  . Not on file  Social History Narrative   Patient lives at home with wife and some of his 10 children. He self-administers his own medications.   Social Determinants of Health   Financial Resource Strain:   . Difficulty of Paying Living Expenses: Not on file  Food Insecurity:   . Worried About Programme researcher, broadcasting/film/video in the Last Year: Not on file  . Ran Out of Food in the Last Year: Not on file  Transportation Needs:   . Lack of Transportation (Medical): Not on file  . Lack of Transportation (Non-Medical): Not on file  Physical Activity:   . Days of Exercise per Week: Not on file  . Minutes of Exercise per Session: Not on file  Stress:   . Feeling of Stress : Not on file  Social Connections:   . Frequency of Communication with Friends and Family: Not on file  . Frequency of Social Gatherings with Friends and Family: Not on file  . Attends Religious Services: Not on file  . Active Member of Clubs or Organizations: Not on file  . Attends Banker Meetings: Not on file  . Marital Status: Not on file  Intimate Partner Violence:   . Fear of Current or Ex-Partner: Not on file  . Emotionally Abused: Not on file  . Physically Abused: Not on file  . Sexually Abused: Not on file    Physical Exam Cardiovascular:     Rate and Rhythm: Normal rate  and regular rhythm.     Pulses: Normal pulses.  Pulmonary:     Effort: Pulmonary effort is normal.     Breath sounds: Normal breath sounds.  Musculoskeletal:        General: Normal range of motion.     Right lower leg: No edema.     Left lower leg: No edema.  Skin:    General: Skin is warm and dry.  Neurological:     Mental Status: He is alert and oriented to person, place, and time.  Psychiatric:  Mood and Affect: Mood normal.         Future Appointments  Date Time Provider [place]  09/01/2019  2:00 PM [facility] PA/NP [facility] None  09/02/2019  8:10 AM [facility] DEVICE REMOTES [facility]  10/03/2019  2:40 PM Larey Dresser, MD [facility] None  12/02/2019  8:10 AM [facility] DEVICE REMOTES [facility]  03/02/2020  8:10 AM [facility] DEVICE REMOTES [facility]  06/01/2020  8:10 AM [facility] DEVICE REMOTES [facility]    BP 93/62 (BP Location: Left Arm, Patient Position: Sitting, Cuff Size: Normal)   Pulse 86   Resp 16   Wt 259 lb (117.5 kg)   SpO2 93%   BMI 30.71 kg/m   Weight yesterday- did not weigh Last visit weight- 257 lb  Mr Lipton was seen at home today and reported feeling well. He denied chest pain, SOB, headache, dizziness, orthopnea, fever or cough over the past week. He stated he has been compliant with his medications over the past week but admitted to missing one day. His medications were verified and his pillbox was refilled. I ordered several medications however the only one needed in his box immediately was Duloxetine. I will follow up next week.   Jacquiline Doe, EMT 08/27/19  ACTION: Home visit completed Next visit planned for 1 week

## 2019-09-01 ENCOUNTER — Encounter (HOSPITAL_COMMUNITY): Payer: Medicare Other

## 2019-09-01 ENCOUNTER — Telehealth (HOSPITAL_COMMUNITY): Payer: Self-pay | Admitting: Licensed Clinical Social Worker

## 2019-09-01 NOTE — Telephone Encounter (Signed)
CSW received call from [place] [employer] informing CSW that they couldn't get a hold of patient for Taxi pick up to come to appt today.  CSW attempted to call pt as well- no answer- unable to leave VM  CSW sent message to [facility] Paramedic to update and assist with contacting patient.  Will continue to follow and assist as needed  Burna Sis, LCSW Clinical Social Worker [facility] Desk#: [phone] Cell#: [phone]

## 2019-09-02 ENCOUNTER — Ambulatory Visit (INDEPENDENT_AMBULATORY_CARE_PROVIDER_SITE_OTHER): Payer: Medicare Other | Admitting: *Deleted

## 2019-09-02 DIAGNOSIS — Z95810 Presence of automatic (implantable) cardiac defibrillator: Secondary | ICD-10-CM

## 2019-09-02 LAB — CUP PACEART REMOTE DEVICE CHECK
Battery Remaining Longevity: 103 mo
Battery Voltage: 3 V
Brady Statistic AP VP Percent: 0 %
Brady Statistic AP VS Percent: 0.01 %
Brady Statistic AS VP Percent: 0.04 %
Brady Statistic AS VS Percent: 99.94 %
Brady Statistic RA Percent Paced: 0.02 %
Brady Statistic RV Percent Paced: 0.04 %
Date Time Interrogation Session: 20210309001605
HighPow Impedance: 73 Ohm
Implantable Lead Implant Date: 20180719
Implantable Lead Implant Date: 20180719
Implantable Lead Location: 753859
Implantable Lead Location: 753860
Implantable Lead Model: 5076
Implantable Pulse Generator Implant Date: 20180719
Lead Channel Impedance Value: 342 Ohm
Lead Channel Impedance Value: 418 Ohm
Lead Channel Impedance Value: 456 Ohm
Lead Channel Pacing Threshold Amplitude: 0.625 V
Lead Channel Pacing Threshold Amplitude: 1 V
Lead Channel Pacing Threshold Pulse Width: 0.4 ms
Lead Channel Pacing Threshold Pulse Width: 0.4 ms
Lead Channel Sensing Intrinsic Amplitude: 3.875 mV
Lead Channel Sensing Intrinsic Amplitude: 3.875 mV
Lead Channel Sensing Intrinsic Amplitude: 9.125 mV
Lead Channel Sensing Intrinsic Amplitude: 9.125 mV
Lead Channel Setting Pacing Amplitude: 2 V
Lead Channel Setting Pacing Amplitude: 2 V
Lead Channel Setting Pacing Pulse Width: 0.4 ms
Lead Channel Setting Sensing Sensitivity: 0.3 mV

## 2019-09-02 NOTE — Progress Notes (Signed)
ICD Remote  

## 2019-09-03 ENCOUNTER — Telehealth (HOSPITAL_COMMUNITY): Payer: Self-pay | Admitting: Licensed Clinical Social Worker

## 2019-09-03 NOTE — Telephone Encounter (Signed)
CSW received call back from pt and pt friend asking to rescheduled appt that pt missed on Monday.  CSW helped connect pt to scheduler- new appt on 3/29- will need a cab- scheduler added to appt notes.  CSW will continue to follow and assist as needed  Burna Sis, LCSW Clinical Social Worker [facility] Desk#: [phone] Cell#: [phone]

## 2019-09-04 ENCOUNTER — Other Ambulatory Visit (HOSPITAL_COMMUNITY): Payer: Self-pay

## 2019-09-04 NOTE — Progress Notes (Signed)
Paramedicine Encounter    Patient ID: Richard [name], male    DOB: 1961/05/04, 59 y.o.   MRN: [id number]   Patient Care Team: Ananias Pilgrim, MD as PCP - General (Family Medicine) Laurey Morale, MD as PCP - Advanced Heart Failure (Cardiology) Burna Sis, LCSW as Social Worker (Licensed Clinical Social Worker)  Patient Active Problem List   Diagnosis Date Noted  . Acute on chronic combined systolic and diastolic CHF (congestive heart failure) (HCC) 03/15/2018  . S/P right and left heart catheterization 11/02/2017  . Panic attacks 11/02/2017  . Non-STEMI (non-ST elevated myocardial infarction) (HCC)   . COPD (chronic obstructive pulmonary disease) (HCC)   . Hypertension   . CKD (chronic kidney disease)   . 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HCC)   . ICD (implantable cardioverter-defibrillator) in place   . GERD (gastroesophageal reflux disease)   . Diabetes mellitus with diabetic neuropathy, with long-term current use of insulin (HCC)     Current Outpatient Medications:  .  acetaminophen (TYLENOL) 650 MG CR tablet, Take 650 mg by mouth every 8 (eight) hours as needed for pain., Disp: , Rfl:  .  albuterol (PROVENTIL) (2.5 MG/3ML) 0.083% nebulizer solution, Inhale into the lungs., Disp: , Rfl:  .  allopurinol (ZYLOPRIM) 100 MG tablet, Take 1 tablet (100 mg total) by mouth daily., Disp: 30 tablet, Rfl: 3 .  aspirin 325 MG tablet, Take 325 mg by mouth daily., Disp: , Rfl:  .  atorvastatin (LIPITOR) 40 MG tablet, TAKE 1 TABLET(40 MG) BY MOUTH DAILY AT 6 PM, Disp: 90 tablet, Rfl: 3 .  BREO ELLIPTA 100-25 MCG/INH AEPB, Inhale 1 puff into the lungs daily., Disp: , Rfl: 5 .  digoxin (LANOXIN) 0.125 MG tablet, TAKE 1 TABLET(0.125 MG) BY MOUTH DAILY, Disp: 30 tablet, Rfl: 3 .  diphenhydrAMINE (BENADRYL) 25 MG tablet, Take 25 mg by mouth at bedtime as needed., Disp: , Rfl:  .  DULoxetine (CYMBALTA) 60 MG capsule, Take 60 mg by mouth daily., Disp: , Rfl: 2 .  eplerenone (INSPRA) 50 MG tablet, Take 1  tablet (50 mg total) by mouth daily., Disp: 30 tablet, Rfl: 5 .  hydrALAZINE (APRESOLINE) 25 MG tablet, Take 1 tablet (25 mg total) by mouth 3 (three) times daily., Disp: 270 tablet, Rfl: 3 .  Insulin Pen Needle (NOVOFINE) 30G X 8 MM MISC, Inject 10 each into the skin as needed., Disp: 100 each, Rfl: 0 .  isosorbide dinitrate (ISORDIL) 20 MG tablet, Take 1 tablet (20 mg total) by mouth 3 (three) times daily., Disp: 90 tablet, Rfl: 11 .  JARDIANCE 25 MG TABS tablet, Take 25 mg by mouth daily., Disp: , Rfl:  .  MAGNESIUM-OXIDE 400 (241.3 Mg) MG tablet, Take 1 tablet (400 mg total) by mouth daily., Disp: 30 tablet, Rfl: 11 .  omeprazole (PRILOSEC) 20 MG capsule, Take 20 mg by mouth daily., Disp: , Rfl: 1 .  pregabalin (LYRICA) 100 MG capsule, Take 1 capsule (100 mg total) by mouth 2 (two) times daily., Disp: 60 capsule, Rfl: 3 .  sacubitril-valsartan (ENTRESTO) 97-103 MG, Take 1 tablet by mouth 2 (two) times daily., Disp: 180 tablet, Rfl: 3 .  sennosides-docusate sodium (SENOKOT-S) 8.6-50 MG tablet, Take 1 tablet by mouth daily., Disp: , Rfl:  .  torsemide (DEMADEX) 20 MG tablet, Take 2 tablets (40 mg total) by mouth 2 (two) times daily., Disp: 360 tablet, Rfl: 3 .  Vitamin D, Cholecalciferol, 1000 units CAPS, Take 1,000 Units by mouth daily., Disp: , Rfl: 11 .  baclofen (LIORESAL) 10 MG tablet, Take 1 tablet (10 mg total) by mouth 3 (three) times daily. (Patient not taking: Reported on 08/13/2019), Disp: 30 each, Rfl: 0 .  bisacodyl (DULCOLAX) 5 MG EC tablet, Take 5 mg by mouth daily as needed for moderate constipation., Disp: , Rfl:  .  insulin regular human CONCENTRATED (HUMULIN R U-500 KWIKPEN) 500 UNIT/ML kwikpen, USE AS DIRECTED PER SLIDING SCALE. MAX DAILY DOSE IS 400 UNITS., Disp: , Rfl:  .  ranitidine (ZANTAC) 150 MG tablet, Take 150 mg by mouth 2 (two) times daily., Disp: , Rfl:  Allergies  Allergen Reactions  . Codeine   . Coconut Oil Itching      Social History   Socioeconomic  History  . Marital status: Divorced    Spouse name: Not on file  . Number of children: Not on file  . Years of education: Not on file  . Highest education level: Not on file  Occupational History  . Occupation: disability    Comment: stopped working in 2000 d/t occupational exposures  Tobacco Use  . Smoking status: Former Smoker    Types: Cigarettes  . Smokeless tobacco: Never Used  . Tobacco comment: "smoked when I drank"  Substance and Sexual Activity  . 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 Drug use: Never  . Sexual activity: Not Currently  Other Topics Concern  . Not on file  Social History Narrative   Patient lives at home with wife and some of his 10 children. He self-administers his own medications.   Social Determinants of Health   Financial Resource Strain:   . Difficulty of Paying Living Expenses:   Food Insecurity:   . Worried About Programme researcher, broadcasting/film/video in the Last Year:   . Barista in the Last Year:   Transportation Needs:   . Freight forwarder (Medical):   Marland Kitchen Lack of Transportation (Non-Medical):   Physical Activity:   . Days of Exercise per Week:   . Minutes of Exercise per Session:   Stress:   . Feeling of Stress :   Social Connections:   . Frequency of Communication with Friends and Family:   . Frequency of Social Gatherings with Friends and Family:   . Attends Religious Services:   . Active Member of Clubs or Organizations:   . Attends Banker Meetings:   Marland Kitchen Marital Status:   Intimate Partner Violence:   . Fear of Current or Ex-Partner:   . Emotionally Abused:   Marland Kitchen Physically Abused:   . Sexually Abused:     Physical Exam Cardiovascular:     Rate and Rhythm: Normal rate and regular rhythm.     Pulses: Normal pulses.  Pulmonary:     Effort: Pulmonary effort is normal.     Breath sounds: Normal breath sounds.  Musculoskeletal:        General: Normal range of  motion.     Right lower leg: No edema.     Left lower leg: No edema.  Skin:    General: Skin is warm and dry.     Capillary Refill: Capillary refill takes less than 2 seconds.  Neurological:     Mental Status: He is alert and oriented to person, place, and time.  Psychiatric:        Mood and Affect: Mood normal.         Future Appointments  Date Time  Provider [place]  09/22/2019  3:00 PM [facility] PA/NP [facility] None  10/03/2019  2:40 PM Larey Dresser, MD [facility] None  12/02/2019  8:10 AM [facility] DEVICE REMOTES [facility]  03/02/2020  8:10 AM [facility] DEVICE REMOTES [facility]  06/01/2020  8:10 AM [facility] DEVICE REMOTES [facility]    BP 120/80 (BP Location: Left Arm, Patient Position: Sitting, Cuff Size: Large)   Pulse 78   Resp 16   Wt 260 lb (117.9 kg)   SpO2 92%   BMI 30.83 kg/m   Weight yesterday- did not weigh Last visit weight- 259 lb  Richard [name] was seen at home today and rpeorted feeling generally well. He denied chest pain, SOB, headache, dizziness, orthopnea, fever or cough over the past week. His biggest concern was the swelling of his left foot. The third toe on this foot has a wound on the bottom and the rest of the foot is red and swollen. He reported this has been treated by a wound care team but it is not currently being monitored. I recommended he contact the wound team that was previously monitoring the issue and see them as soon as possible. He also complained of had pain secondary to arthritis. I advised I would speak to our team about referring him to a rheumatologist to treat this but told him he should also speak to his PCP. He was understanding and agreeable. Lastly he has been getting atraumatic blisters on his hands. I advised he should speak to his PCP and get a referral to a dermatologist if necessary. He was understanding and agreeable. His medications were verified and his pillbox was refilled. I  will follow up next week.   Jacquiline Doe, EMT 09/04/19  ACTION: Home visit completed Next visit planned for 1 week

## 2019-09-08 ENCOUNTER — Telehealth (HOSPITAL_COMMUNITY): Payer: Self-pay | Admitting: Licensed Clinical Social Worker

## 2019-09-08 NOTE — Telephone Encounter (Signed)
CSW called pt to see if they have received or been scheduled to receive the COVID-19 vaccine at this time.  Patient has not received the vaccine and is not interested in getting it at this time.  Encouraged pt to call for information on how to sign up if he changes his mind.  Burna Sis, LCSW Clinical Social Worker [facility] Desk#: [phone] Cell#: [phone]

## 2019-09-10 ENCOUNTER — Other Ambulatory Visit (HOSPITAL_COMMUNITY): Payer: Self-pay

## 2019-09-10 NOTE — Progress Notes (Signed)
Paramedicine Encounter    Patient ID: Richard [name], male    DOB: April 20, 1961, 59 y.o.   MRN: [id number]   Patient Care Team: Ananias Pilgrim, MD as PCP - General (Family Medicine) Laurey Morale, MD as PCP - Advanced Heart Failure (Cardiology) Burna Sis, LCSW as Social Worker (Licensed Clinical Social Worker)  Patient Active Problem List   Diagnosis Date Noted  . Acute on chronic combined systolic and diastolic CHF (congestive heart failure) (HCC) 03/15/2018  . S/P right and left heart catheterization 11/02/2017  . Panic attacks 11/02/2017  . Non-STEMI (non-ST elevated myocardial infarction) (HCC)   . COPD (chronic obstructive pulmonary disease) (HCC)   . Hypertension   . CKD (chronic kidney disease)   . 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HCC)   . ICD (implantable cardioverter-defibrillator) in place   . GERD (gastroesophageal reflux disease)   . Diabetes mellitus with diabetic neuropathy, with long-term current use of insulin (HCC)     Current Outpatient Medications:  .  acetaminophen (TYLENOL) 650 MG CR tablet, Take 650 mg by mouth every 8 (eight) hours as needed for pain., Disp: , Rfl:  .  albuterol (PROVENTIL) (2.5 MG/3ML) 0.083% nebulizer solution, Inhale into the lungs., Disp: , Rfl:  .  allopurinol (ZYLOPRIM) 100 MG tablet, Take 1 tablet (100 mg total) by mouth daily., Disp: 30 tablet, Rfl: 3 .  aspirin 325 MG tablet, Take 325 mg by mouth daily., Disp: , Rfl:  .  atorvastatin (LIPITOR) 40 MG tablet, TAKE 1 TABLET(40 MG) BY MOUTH DAILY AT 6 PM, Disp: 90 tablet, Rfl: 3 .  bisacodyl (DULCOLAX) 5 MG EC tablet, Take 5 mg by mouth daily as needed for moderate constipation., Disp: , Rfl:  .  BREO ELLIPTA 100-25 MCG/INH AEPB, Inhale 1 puff into the lungs daily., Disp: , Rfl: 5 .  digoxin (LANOXIN) 0.125 MG tablet, TAKE 1 TABLET(0.125 MG) BY MOUTH DAILY, Disp: 30 tablet, Rfl: 3 .  diphenhydrAMINE (BENADRYL) 25 MG tablet, Take 25 mg by mouth at bedtime as needed., Disp: , Rfl:  .   DULoxetine (CYMBALTA) 60 MG capsule, Take 60 mg by mouth daily., Disp: , Rfl: 2 .  eplerenone (INSPRA) 50 MG tablet, Take 1 tablet (50 mg total) by mouth daily., Disp: 30 tablet, Rfl: 5 .  hydrALAZINE (APRESOLINE) 25 MG tablet, Take 1 tablet (25 mg total) by mouth 3 (three) times daily., Disp: 270 tablet, Rfl: 3 .  Insulin Pen Needle (NOVOFINE) 30G X 8 MM MISC, Inject 10 each into the skin as needed., Disp: 100 each, Rfl: 0 .  insulin regular human CONCENTRATED (HUMULIN R U-500 KWIKPEN) 500 UNIT/ML kwikpen, USE AS DIRECTED PER SLIDING SCALE. MAX DAILY DOSE IS 400 UNITS., Disp: , Rfl:  .  isosorbide dinitrate (ISORDIL) 20 MG tablet, Take 1 tablet (20 mg total) by mouth 3 (three) times daily., Disp: 90 tablet, Rfl: 11 .  JARDIANCE 25 MG TABS tablet, Take 25 mg by mouth daily., Disp: , Rfl:  .  MAGNESIUM-OXIDE 400 (241.3 Mg) MG tablet, Take 1 tablet (400 mg total) by mouth daily., Disp: 30 tablet, Rfl: 11 .  omeprazole (PRILOSEC) 20 MG capsule, Take 20 mg by mouth daily., Disp: , Rfl: 1 .  pregabalin (LYRICA) 100 MG capsule, Take 1 capsule (100 mg total) by mouth 2 (two) times daily., Disp: 60 capsule, Rfl: 3 .  ranitidine (ZANTAC) 150 MG tablet, Take 150 mg by mouth 2 (two) times daily., Disp: , Rfl:  .  sacubitril-valsartan (ENTRESTO) 97-103 MG, Take 1  tablet by mouth 2 (two) times daily., Disp: 180 tablet, Rfl: 3 .  sennosides-docusate sodium (SENOKOT-S) 8.6-50 MG tablet, Take 1 tablet by mouth daily., Disp: , Rfl:  .  torsemide (DEMADEX) 20 MG tablet, Take 2 tablets (40 mg total) by mouth 2 (two) times daily., Disp: 360 tablet, Rfl: 3 .  Vitamin D, Cholecalciferol, 1000 units CAPS, Take 1,000 Units by mouth daily., Disp: , Rfl: 11 .  baclofen (LIORESAL) 10 MG tablet, Take 1 tablet (10 mg total) by mouth 3 (three) times daily. (Patient not taking: Reported on 08/13/2019), Disp: 30 each, Rfl: 0 Allergies  Allergen Reactions  . Codeine   . Coconut Oil Itching      Social History    Socioeconomic History  . Marital status: Divorced    Spouse name: Not on file  . Number of children: Not on file  . Years of education: Not on file  . Highest education level: Not on file  Occupational History  . Occupation: disability    Comment: stopped working in 2000 d/t occupational exposures  Tobacco Use  . Smoking status: Former Smoker    Types: Cigarettes  . Smokeless tobacco: Never Used  . Tobacco comment: "smoked when I drank"  Substance and Sexual Activity  . 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 Drug use: Never  . Sexual activity: Not Currently  Other Topics Concern  . Not on file  Social History Narrative   Patient lives at home with wife and some of his 10 children. He self-administers his own medications.   Social Determinants of Health   Financial Resource Strain:   . Difficulty of Paying Living Expenses:   Food Insecurity:   . Worried About Programme researcher, broadcasting/film/video in the Last Year:   . Barista in the Last Year:   Transportation Needs:   . Freight forwarder (Medical):   Marland Kitchen Lack of Transportation (Non-Medical):   Physical Activity:   . Days of Exercise per Week:   . Minutes of Exercise per Session:   Stress:   . Feeling of Stress :   Social Connections:   . Frequency of Communication with Friends and Family:   . Frequency of Social Gatherings with Friends and Family:   . Attends Religious Services:   . Active Member of Clubs or Organizations:   . Attends Banker Meetings:   Marland Kitchen Marital Status:   Intimate Partner Violence:   . Fear of Current or Ex-Partner:   . Emotionally Abused:   Marland Kitchen Physically Abused:   . Sexually Abused:     Physical Exam Cardiovascular:     Rate and Rhythm: Normal rate and regular rhythm.     Pulses: Normal pulses.  Pulmonary:     Effort: Pulmonary effort is normal.     Breath sounds: Normal breath sounds.  Musculoskeletal:        General:  Normal range of motion.     Right lower leg: Edema present.     Left lower leg: Edema present.  Skin:    General: Skin is warm and dry.     Capillary Refill: Capillary refill takes less than 2 seconds.  Neurological:     Mental Status: He is alert and oriented to person, place, and time.  Psychiatric:        Mood and Affect: Mood normal.         Future Appointments  Date Time Provider [place]  09/22/2019  3:00 PM [facility] PA/NP [facility] None  10/03/2019  2:40 PM Larey Dresser, MD [facility] None  12/02/2019  8:10 AM [facility] DEVICE REMOTES [facility]  03/02/2020  8:10 AM [facility] DEVICE REMOTES [facility]  06/01/2020  8:10 AM [facility] DEVICE REMOTES [facility]    BP 116/66 (BP Location: Left Arm, Patient Position: Sitting, Cuff Size: Normal)   Pulse 88   Resp 16   Wt 259 lb (117.5 kg)   SpO2 93%   BMI 30.71 kg/m   Weight yesterday- 259 lb Last visit weight- 260 lb  Richard [name] was seen at home today and reported feeling well. He denied chest pain, SOB, headache, dizziness, orthopnea, fever or cough since our last visit. He stated he has been compliant with his medications over the past week and his weight has been stable. His medications were verified and his pillbox was refilled. I will follow up next week.   Jacquiline Doe, EMT 09/10/19  ACTION: Home visit completed Next visit planned for 1 week

## 2019-09-17 ENCOUNTER — Other Ambulatory Visit (HOSPITAL_COMMUNITY): Payer: Self-pay

## 2019-09-17 NOTE — Progress Notes (Signed)
Paramedicine Encounter    Patient ID: Richard [name], male    DOB: Sep 22, 1960, 59 y.o.   MRN: [id number]   Patient Care Team: Ananias Pilgrim, MD as PCP - General (Family Medicine) Laurey Morale, MD as PCP - Advanced Heart Failure (Cardiology) Burna Sis, LCSW as Social Worker (Licensed Clinical Social Worker)  Patient Active Problem List   Diagnosis Date Noted  . Acute on chronic combined systolic and diastolic CHF (congestive heart failure) (HCC) 03/15/2018  . S/P right and left heart catheterization 11/02/2017  . Panic attacks 11/02/2017  . Non-STEMI (non-ST elevated myocardial infarction) (HCC)   . COPD (chronic obstructive pulmonary disease) (HCC)   . Hypertension   . CKD (chronic kidney disease)   . 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HCC)   . ICD (implantable cardioverter-defibrillator) in place   . GERD (gastroesophageal reflux disease)   . Diabetes mellitus with diabetic neuropathy, with long-term current use of insulin (HCC)     Current Outpatient Medications:  .  acetaminophen (TYLENOL) 650 MG CR tablet, Take 650 mg by mouth every 8 (eight) hours as needed for pain., Disp: , Rfl:  .  albuterol (PROVENTIL) (2.5 MG/3ML) 0.083% nebulizer solution, Inhale into the lungs., Disp: , Rfl:  .  allopurinol (ZYLOPRIM) 100 MG tablet, Take 1 tablet (100 mg total) by mouth daily., Disp: 30 tablet, Rfl: 3 .  aspirin 325 MG tablet, Take 325 mg by mouth daily., Disp: , Rfl:  .  atorvastatin (LIPITOR) 40 MG tablet, TAKE 1 TABLET(40 MG) BY MOUTH DAILY AT 6 PM, Disp: 90 tablet, Rfl: 3 .  bisacodyl (DULCOLAX) 5 MG EC tablet, Take 5 mg by mouth daily as needed for moderate constipation., Disp: , Rfl:  .  BREO ELLIPTA 100-25 MCG/INH AEPB, Inhale 1 puff into the lungs daily., Disp: , Rfl: 5 .  digoxin (LANOXIN) 0.125 MG tablet, TAKE 1 TABLET(0.125 MG) BY MOUTH DAILY, Disp: 30 tablet, Rfl: 3 .  diphenhydrAMINE (BENADRYL) 25 MG tablet, Take 25 mg by mouth at bedtime as needed., Disp: , Rfl:  .   DULoxetine (CYMBALTA) 60 MG capsule, Take 60 mg by mouth daily., Disp: , Rfl: 2 .  eplerenone (INSPRA) 50 MG tablet, Take 1 tablet (50 mg total) by mouth daily., Disp: 30 tablet, Rfl: 5 .  hydrALAZINE (APRESOLINE) 25 MG tablet, Take 1 tablet (25 mg total) by mouth 3 (three) times daily., Disp: 270 tablet, Rfl: 3 .  isosorbide dinitrate (ISORDIL) 20 MG tablet, Take 1 tablet (20 mg total) by mouth 3 (three) times daily., Disp: 90 tablet, Rfl: 11 .  JARDIANCE 25 MG TABS tablet, Take 25 mg by mouth daily., Disp: , Rfl:  .  MAGNESIUM-OXIDE 400 (241.3 Mg) MG tablet, Take 1 tablet (400 mg total) by mouth daily., Disp: 30 tablet, Rfl: 11 .  pregabalin (LYRICA) 100 MG capsule, Take 1 capsule (100 mg total) by mouth 2 (two) times daily., Disp: 60 capsule, Rfl: 3 .  sacubitril-valsartan (ENTRESTO) 97-103 MG, Take 1 tablet by mouth 2 (two) times daily., Disp: 180 tablet, Rfl: 3 .  sennosides-docusate sodium (SENOKOT-S) 8.6-50 MG tablet, Take 1 tablet by mouth daily., Disp: , Rfl:  .  torsemide (DEMADEX) 20 MG tablet, Take 2 tablets (40 mg total) by mouth 2 (two) times daily., Disp: 360 tablet, Rfl: 3 .  Vitamin D, Cholecalciferol, 1000 units CAPS, Take 1,000 Units by mouth daily., Disp: , Rfl: 11 .  baclofen (LIORESAL) 10 MG tablet, Take 1 tablet (10 mg total) by mouth 3 (three) times  daily. (Patient not taking: Reported on 08/13/2019), Disp: 30 each, Rfl: 0 .  Insulin Pen Needle (NOVOFINE) 30G X 8 MM MISC, Inject 10 each into the skin as needed., Disp: 100 each, Rfl: 0 .  insulin regular human CONCENTRATED (HUMULIN R U-500 KWIKPEN) 500 UNIT/ML kwikpen, USE AS DIRECTED PER SLIDING SCALE. MAX DAILY DOSE IS 400 UNITS., Disp: , Rfl:  .  omeprazole (PRILOSEC) 20 MG capsule, Take 20 mg by mouth daily., Disp: , Rfl: 1 .  ranitidine (ZANTAC) 150 MG tablet, Take 150 mg by mouth 2 (two) times daily., Disp: , Rfl:  Allergies  Allergen Reactions  . Codeine   . Coconut Oil Itching      Social History    Socioeconomic History  . Marital status: Divorced    Spouse name: Not on file  . Number of children: Not on file  . Years of education: Not on file  . Highest education level: Not on file  Occupational History  . Occupation: disability    Comment: stopped working in 2000 d/t occupational exposures  Tobacco Use  . Smoking status: Former Smoker    Types: Cigarettes  . Smokeless tobacco: Never Used  . Tobacco comment: "smoked when I drank"  Substance and Sexual Activity  . 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 Drug use: Never  . Sexual activity: Not Currently  Other Topics Concern  . Not on file  Social History Narrative   Patient lives at home with wife and some of his 10 children. He self-administers his own medications.   Social Determinants of Health   Financial Resource Strain:   . Difficulty of Paying Living Expenses:   Food Insecurity:   . Worried About Programme researcher, broadcasting/film/video in the Last Year:   . Barista in the Last Year:   Transportation Needs:   . Freight forwarder (Medical):   Marland Kitchen Lack of Transportation (Non-Medical):   Physical Activity:   . Days of Exercise per Week:   . Minutes of Exercise per Session:   Stress:   . Feeling of Stress :   Social Connections:   . Frequency of Communication with Friends and Family:   . Frequency of Social Gatherings with Friends and Family:   . Attends Religious Services:   . Active Member of Clubs or Organizations:   . Attends Banker Meetings:   Marland Kitchen Marital Status:   Intimate Partner Violence:   . Fear of Current or Ex-Partner:   . Emotionally Abused:   Marland Kitchen Physically Abused:   . Sexually Abused:     Physical Exam Cardiovascular:     Rate and Rhythm: Normal rate and regular rhythm.     Pulses: Normal pulses.  Pulmonary:     Effort: Pulmonary effort is normal.     Breath sounds: Normal breath sounds.  Musculoskeletal:        General:  Normal range of motion.     Right lower leg: No edema.     Left lower leg: No edema.  Skin:    General: Skin is warm and dry.     Capillary Refill: Capillary refill takes less than 2 seconds.  Neurological:     Mental Status: He is alert and oriented to person, place, and time.  Psychiatric:        Mood and Affect: Mood normal.         Future Appointments  Date Time Provider [place]  09/22/2019  3:00 PM [facility] PA/NP [facility] None  10/03/2019  2:40 PM Larey Dresser, MD [facility] None  12/02/2019  8:10 AM [facility] DEVICE REMOTES [facility]  03/02/2020  8:10 AM [facility] DEVICE REMOTES [facility]  06/01/2020  8:10 AM [facility] DEVICE REMOTES [facility]    BP 110/70 (BP Location: Left Arm, Patient Position: Sitting, Cuff Size: Normal)   Pulse 80   Resp 18   Wt 262 lb (118.8 kg)   SpO2 96%   BMI 31.07 kg/m   Weight yesterday- 263 lb Last visit weight- 259 lb  Richard [name] was seen at home today and reported feeling well. He denied chest pain, SOB, headache, dizziness, orthopnea, fever or cough over the past week. He stated he has been compliant with his medications over the past week and his weight has been stable. His medications were verified and his pillbox was refilled. I will follow up next week.   Jacquiline Doe, EMT 09/17/19  ACTION: Home visit completed Next visit planned for 1 week

## 2019-09-18 ENCOUNTER — Other Ambulatory Visit (HOSPITAL_COMMUNITY): Payer: Self-pay

## 2019-09-18 MED ORDER — DIGOXIN 125 MCG PO TABS: ORAL_TABLET | ORAL | 6 refills | Status: DC

## 2019-09-22 ENCOUNTER — Ambulatory Visit (HOSPITAL_COMMUNITY)
Admission: RE | Admit: 2019-09-22 | Discharge: 2019-09-22 | Disposition: A | Discharge status: Home / Self Care | Payer: Medicare Other | Source: Ambulatory Visit | Attending: Cardiology | Admitting: Cardiology

## 2019-09-22 ENCOUNTER — Other Ambulatory Visit: Payer: Self-pay

## 2019-09-22 ENCOUNTER — Telehealth (HOSPITAL_COMMUNITY): Payer: Self-pay | Admitting: Licensed Clinical Social Worker

## 2019-09-22 ENCOUNTER — Encounter (HOSPITAL_COMMUNITY): Payer: Self-pay

## 2019-09-22 VITALS — BP 106/80 | HR 88 | Wt 262.6 lb

## 2019-09-22 DIAGNOSIS — Z833 Family history of diabetes mellitus: Secondary | ICD-10-CM | POA: Diagnosis not present

## 2019-09-22 DIAGNOSIS — E785 Hyperlipidemia, unspecified: Secondary | ICD-10-CM | POA: Diagnosis not present

## 2019-09-22 DIAGNOSIS — Z95810 Presence of automatic (implantable) cardiac defibrillator: Secondary | ICD-10-CM | POA: Diagnosis not present

## 2019-09-22 DIAGNOSIS — J449 Chronic obstructive pulmonary disease, unspecified: Secondary | ICD-10-CM | POA: Insufficient documentation

## 2019-09-22 DIAGNOSIS — G4733 Obstructive sleep apnea (adult) (pediatric): Secondary | ICD-10-CM | POA: Diagnosis not present

## 2019-09-22 DIAGNOSIS — I130 Hypertensive heart and chronic kidney disease with heart failure and stage 1 through stage 4 chronic kidney disease, or unspecified chronic kidney disease: Secondary | ICD-10-CM | POA: Diagnosis not present

## 2019-09-22 DIAGNOSIS — I2510 Atherosclerotic heart disease of native coronary artery without angina pectoris: Secondary | ICD-10-CM | POA: Diagnosis not present

## 2019-09-22 DIAGNOSIS — M109 Gout, unspecified: Secondary | ICD-10-CM | POA: Diagnosis not present

## 2019-09-22 DIAGNOSIS — M7989 Other specified soft tissue disorders: Secondary | ICD-10-CM | POA: Diagnosis not present

## 2019-09-22 DIAGNOSIS — I428 Other cardiomyopathies: Secondary | ICD-10-CM | POA: Insufficient documentation

## 2019-09-22 DIAGNOSIS — Z7982 Long term (current) use of aspirin: Secondary | ICD-10-CM | POA: Insufficient documentation

## 2019-09-22 DIAGNOSIS — Z87891 Personal history of nicotine dependence: Secondary | ICD-10-CM | POA: Insufficient documentation

## 2019-09-22 DIAGNOSIS — D751 Secondary polycythemia: Secondary | ICD-10-CM | POA: Diagnosis not present

## 2019-09-22 DIAGNOSIS — Z79899 Other long term (current) drug therapy: Secondary | ICD-10-CM | POA: Diagnosis not present

## 2019-09-22 DIAGNOSIS — Z955 Presence of coronary angioplasty implant and graft: Secondary | ICD-10-CM | POA: Diagnosis not present

## 2019-09-22 DIAGNOSIS — K219 Gastro-esophageal reflux disease without esophagitis: Secondary | ICD-10-CM | POA: Insufficient documentation

## 2019-09-22 DIAGNOSIS — E1122 Type 2 diabetes mellitus with diabetic chronic kidney disease: Secondary | ICD-10-CM | POA: Diagnosis not present

## 2019-09-22 DIAGNOSIS — Z794 Long term (current) use of insulin: Secondary | ICD-10-CM | POA: Insufficient documentation

## 2019-09-22 DIAGNOSIS — I5042 Chronic combined systolic (congestive) and diastolic (congestive) heart failure: Secondary | ICD-10-CM | POA: Diagnosis present

## 2019-09-22 DIAGNOSIS — Z8249 Family history of ischemic heart disease and other diseases of the circulatory system: Secondary | ICD-10-CM | POA: Diagnosis not present

## 2019-09-22 DIAGNOSIS — Z885 Allergy status to narcotic agent status: Secondary | ICD-10-CM | POA: Insufficient documentation

## 2019-09-22 DIAGNOSIS — N183 Chronic kidney disease, stage 3 unspecified: Secondary | ICD-10-CM | POA: Diagnosis not present

## 2019-09-22 DIAGNOSIS — Z7951 Long term (current) use of inhaled steroids: Secondary | ICD-10-CM | POA: Insufficient documentation

## 2019-09-22 DIAGNOSIS — I5022 Chronic systolic (congestive) heart failure: Secondary | ICD-10-CM | POA: Insufficient documentation

## 2019-09-22 DIAGNOSIS — Z9981 Dependence on supplemental oxygen: Secondary | ICD-10-CM | POA: Insufficient documentation

## 2019-09-22 LAB — BASIC METABOLIC PANEL
Anion gap: 13 (ref 5–15)
BUN: 25 mg/dL — ABNORMAL HIGH (ref 6–20)
CO2: 35 mmol/L — ABNORMAL HIGH (ref 22–32)
Calcium: 9.5 mg/dL (ref 8.9–10.3)
Chloride: 92 mmol/L — ABNORMAL LOW (ref 98–111)
Creatinine, Ser: 1.31 mg/dL — ABNORMAL HIGH (ref 0.61–1.24)
GFR calc Af Amer: >60 mL/min (ref >60)
GFR calc non Af Amer: >60 mL/min (ref >60)
Glucose, Bld: 132 mg/dL — ABNORMAL HIGH (ref 70–99)
Potassium: 4.3 mmol/L (ref 3.5–5.1)
Sodium: 140 mmol/L (ref 135–145)

## 2019-09-22 LAB — URIC ACID: Uric Acid, Serum: 8.2 mg/dL (ref 3.7–8.6)

## 2019-09-22 NOTE — Telephone Encounter (Signed)
CSW called pt to make sure he is aware of this afternoons appt at 3pm and taxi coming around 2:30pm.  Pt answered and confirmed he is aware of appt and will be able to attend.  CSW will continue to follow and assist as needed  Burna Sis, LCSW Clinical Social Worker [facility] Desk#: [phone] Cell#: [phone]

## 2019-09-22 NOTE — Progress Notes (Signed)
[facility] Note   PCP: Ananias Pilgrim, MD Cardiologist: Dr Shirlee Latch  HPI: Richard [name] is a 59 y.o. male with a history of chronic systolic HF s/p Medtronic ICD (12/2016), HTN, DM, and HLD.   Admitted 5/8-5/14/19 with A/C systolic HF. Echo showed EF 15-20%. Advanced HF team was consulted. He diuresed 20 lbs with IV lasix, then transitioned to torsemide 40 mg daily. Underwent R/LHC showing a focal severe RCA stenosis treated with DES.  He did not, however, appear to have severe enough coronary disease to explain his cardiomyopathy. Cardiac index was low at 1.9.  HF meds were optimized. Beta blocker was not started due to low CI and soft BPs. He was discharged on home oxygen. Referred to cardiac rehab. DC weight: 260 lbs.   Echo in 2/20 showed EF 20-25% with severe LV dilation, moderate RV enlargement with moderately decreased RV systolic function.   Today he returns for HF follow up. Doing well since his last visit. States he feels "much better" on higher dose of Entresto. Exercise tolerance improved. NYHA Class II. Denies resting dyspnea. No CP. Compliant w/ medications. BP soft 106/80s. Notes occasional dizziness w/ quick/ sudden positional changes but no syncope/ near syncope. Compliant w/ daily wts and wts have been stable at home.   Only complaint today is left great toe swelling w/ mild discomfort. Has h/o gout. Reports compliance w/ allopurinol.    Labs (10/19): K 3.9, creatinine 1.18  Labs (10/20): BNP 160, K 3.8, creatinine 1.31 Lass (07/25/19): K 3.7 Creatinine 1.22 Dig < 0.2  Labs (08/07/19): K 5.1, Creatinine 1.56  PMH: 1. Chronic systolic CHF: Primarily nonischemic cardiomyopathy.  Has Medtronic ICD.   - Echo 5/19: EF 15-20%, grade 2 DD, mild MR, RV severely reduced, LA moderately dilated, RA mildly dilated, mild TR.  - RHC/LHC (5/19): 80% distal LAD stenosis, 80% proximal RCA stenosis treated with DES to pRCA. Mean RA 13, PA 69/31 mean 47, mean 24, CI 1.91, PVR  4.8 WU.  - Gynecomastia with spironolactone. - Echo (2/20): EF 20-25%, severe LV enlargement, moderate RV enlargement, moderately decreased RV systolic function.  2. COPD: Uses oxygen at night. Never smoked, but apparently had significant occupational exposure. It appears that he won a lawsuit dealing with the occupational exposure-related COPD.  3. CAD: Cath in 5/17 with 60% RCA stenosis.  - LHC (5/19): 80% distal LAD, 80% proximal RCA.  DES to proximal RCA.  4. Polycythemia: Probably due to chronic hypoxemia.  5. Type II diabetes.  6. HTN 7. GERD 8. CKD: Stage 3.  Likely due to diabetes and HTN.  9. OSA: Unable to tolerate CPAP.   Review of systems complete and found to be negative unless listed in HPI.    Current Outpatient Medications  Medication Sig Dispense Refill  . allopurinol (ZYLOPRIM) 100 MG tablet Take 1 tablet (100 mg total) by mouth daily. 30 tablet 3  . aspirin 81 MG chewable tablet Chew 81 mg by mouth daily.    Marland Kitchen atorvastatin (LIPITOR) 40 MG tablet TAKE 1 TABLET(40 MG) BY MOUTH DAILY AT 6 PM 90 tablet 3  . bisacodyl (DULCOLAX) 5 MG EC tablet Take 5 mg by mouth daily as needed for moderate constipation.    Marland Kitchen BREO ELLIPTA 100-25 MCG/INH AEPB Inhale 1 puff into the lungs daily.  5  . digoxin (LANOXIN) 0.125 MG tablet TAKE 1 TABLET(0.125 MG) BY MOUTH DAILY 30 tablet 6  . diphenhydrAMINE (BENADRYL) 25 MG tablet Take 25 mg by mouth at bedtime  as needed.    . DULoxetine (CYMBALTA) 60 MG capsule Take 60 mg by mouth daily.  2  . eplerenone (INSPRA) 50 MG tablet Take 1 tablet (50 mg total) by mouth daily. 30 tablet 5  . hydrALAZINE (APRESOLINE) 25 MG tablet Take 1 tablet (25 mg total) by mouth 3 (three) times daily. 270 tablet 3  . Insulin Pen Needle (NOVOFINE) 30G X 8 MM MISC Inject 10 each into the skin as needed. 100 each 0  . insulin regular human CONCENTRATED (HUMULIN R U-500 KWIKPEN) 500 UNIT/ML kwikpen USE AS DIRECTED PER SLIDING SCALE. MAX DAILY DOSE IS 400 UNITS.    Marland Kitchen  isosorbide dinitrate (ISORDIL) 20 MG tablet Take 1 tablet (20 mg total) by mouth 3 (three) times daily. 90 tablet 11  . JARDIANCE 25 MG TABS tablet Take 25 mg by mouth daily.    Marland Kitchen MAGNESIUM-OXIDE 400 (241.3 Mg) MG tablet Take 1 tablet (400 mg total) by mouth daily. 30 tablet 11  . omeprazole (PRILOSEC) 20 MG capsule Take 20 mg by mouth daily.  1  . pregabalin (LYRICA) 100 MG capsule Take 1 capsule (100 mg total) by mouth 2 (two) times daily. 60 capsule 3  . ranitidine (ZANTAC) 150 MG tablet Take 150 mg by mouth 2 (two) times daily.    . sacubitril-valsartan (ENTRESTO) 97-103 MG Take 1 tablet by mouth 2 (two) times daily. 180 tablet 3  . sennosides-docusate sodium (SENOKOT-S) 8.6-50 MG tablet Take 1 tablet by mouth daily.    Marland Kitchen torsemide (DEMADEX) 20 MG tablet Take 2 tablets (40 mg total) by mouth 2 (two) times daily. 360 tablet 3  . Vitamin D, Cholecalciferol, 1000 units CAPS Take 1,000 Units by mouth daily.  11  . acetaminophen (TYLENOL) 650 MG CR tablet Take 650 mg by mouth every 8 (eight) hours as needed for pain.    Marland Kitchen albuterol (PROVENTIL) (2.5 MG/3ML) 0.083% nebulizer solution Inhale into the lungs.    . baclofen (LIORESAL) 10 MG tablet Take 1 tablet (10 mg total) by mouth 3 (three) times daily. (Patient not taking: Reported on 08/13/2019) 30 each 0   No current facility-administered medications for this encounter.   Allergies  Allergen Reactions  . Codeine   . Coconut Oil Itching    Social History   Socioeconomic History  . Marital status: Divorced    Spouse name: Not on file  . Number of children: Not on file  . Years of education: Not on file  . Highest education level: Not on file  Occupational History  . Occupation: disability    Comment: stopped working in 2000 d/t occupational exposures  Tobacco Use  . Smoking status: Former Smoker    Types: Cigarettes  . Smokeless tobacco: Never Used  . Tobacco comment: "smoked when I drank"  Substance and Sexual Activity  . Alcohol  use: Not Currently    Comment: used to drink multiple cases of beer daily, quit 2018  . Drug use: Never  . Sexual activity: Not Currently  Other Topics Concern  . Not on file  Social History Narrative   Patient lives at home with wife and some of his 10 children. He self-administers his own medications.   Social Determinants of Health   Financial Resource Strain:   . Difficulty of Paying Living Expenses:   Food Insecurity:   . Worried About Charity fundraiser in the Last Year:   . Arboriculturist in the Last Year:   Transportation Needs:   .  Lack of Transportation (Medical):   Marland Kitchen Lack of Transportation (Non-Medical):   Physical Activity:   . Days of Exercise per Week:   . Minutes of Exercise per Session:   Stress:   . Feeling of Stress :   Social Connections:   . Frequency of Communication with Friends and Family:   . Frequency of Social Gatherings with Friends and Family:   . Attends Religious Services:   . Active Member of Clubs or Organizations:   . Attends Banker Meetings:   Marland Kitchen Marital Status:   Intimate Partner Violence:   . Fear of Current or Ex-Partner:   . Emotionally Abused:   Marland Kitchen Physically Abused:   . Sexually Abused:    Family History  Problem Relation Age of Onset  . Hypertension Mother   . Diabetes Mother   . Hypertension Father   . Diabetes Father   . Diabetes Sister   . Diabetes Brother    Vitals:   09/22/19 1511  BP: 106/80  Pulse: 88  SpO2: 92%  Weight: 119.1 kg (262 lb 9.6 oz)     Wt Readings from Last 3 Encounters:  09/22/19 119.1 kg (262 lb 9.6 oz)  09/17/19 118.8 kg (262 lb)  09/10/19 117.5 kg (259 lb)   PHYSICAL EXAM: General:  Well appearing, moderately obese. No respiratory difficulty HEENT: normal Neck: supple. no JVD. Carotids 2+ bilat; no bruits. No lymphadenopathy or thyromegaly appreciated. Cor: PMI nondisplaced. Regular rate & rhythm. No rubs, gallops or murmurs. Lungs: clear Abdomen: obese, soft, nontender,  nondistended. No hepatosplenomegaly. No bruits or masses. Good bowel sounds. Extremities: no cyanosis, clubbing, rash, edema, + chronic venous stasis dermatitis, chronic LE wounds (healed)  Neuro: alert & oriented x 3, cranial nerves grossly intact. moves all 4 extremities w/o difficulty. Affect pleasant.    ASSESSMENT & PLAN:  1. Chronic systolic CHF:  Primarily nonischemic cardiomyopathy. Echo in 2017 with EF 15-20%. Medtronic ICD. Echo 10/2017 EF 15-20%, severely dilated RV with severely decreased systolic function. LHC/RHC 11/06/17 showed volume overload with 80% RCA stenosis.  Cardiac index low at 1.91. The degree of coronary disease does not explain his cardiomyopathy.  Echo in 2/20 showed severe LV dilation, EF 20-25%, moderately decreased RV systolic function.  - Exercise tolerance improving w/ medical therapy, now NYHA II. Volume status stable. Euvolemic by exam and by Optivol.   -Continue torsemide 40 mg bid.   - Continue Entresto 97-103 mg BID.  - Continue  eplerenone  50 mg daily.   - Continue hydralazine 25 mg tid and isordil to 20 tid.   - Continue digoxin 0.125 mg daily. Check dig level  - BP too soft for further med titration.  blocker was discontinued previous visit due to hypotension. - Continue empagliflozin.  - Check BMP today  - He may be a future LVAD candidate.     - Continue w/ paramedicine  2. COPD: Never smoked, but apparently had significant occupational exposure. It appears that he won a lawsuit dealing with the occupational exposure-related COPD.  3. CAD:  LHC in 5/19 with 80-90% proximal RCA stenosis treated with DES to RCA.  This did not cause his cardiomyopathy but is a large RCA.  No chest pain .  - Continue ASA 81 daily.  - Continue atorvastatin 40 mg qhs. Not fasting today for labs.  4. Polycythemia: Likely related to chronic hypoxia.  5. OSA: Cannot tolerate CPAP, wears oxygen at night.  6. DM: He is on empagliflozin.   7. Ulcer:  Slow to heal foot  ulcer.  He had peripheral arterial dopplers arranged by PCP done in [place] 1/21. I have personally reviewed Care Everywhere but cannot locate results. He reports he was told he "did not have any blockages" in his legs 8. Left Great Toe Swelling: + mild erythema. No recent injury or trauma. On loop diuretics and known h/o gout.  - check uric acid to r/o acute gout flare.   Keep f/u w/ Dr. Shirlee Latch in 2 weeks   Robbie Lis PA-C 09/22/2019

## 2019-09-22 NOTE — Patient Instructions (Addendum)
Labs done today. We will contact you only if your labs are abnormal.   No medication changes were made. Please continue all current medications as prescribed.   Please keep your scheduled pending appointment with Dr. Shirlee Latch on April 9th,2021 at 2:40pm.   Do the following things EVERYDAY: 1) Weigh yourself in the morning before breakfast. Write it down and keep it in a log. 2) Take your medicines as prescribed 3) Eat low salt foods--Limit salt (sodium) to 2000 mg per day.  4) Stay as active as you can everyday 5) Limit all fluids for the day to less than 2 liters   At the [facility], you and your health needs are our priority. As part of our continuing mission to provide you with exceptional heart care, we have created designated Provider Care Teams. These Care Teams include your primary Cardiologist (physician) and Advanced Practice Providers (APPs- Physician Assistants and Nurse Practitioners) who all work together to provide you with the care you need, when you need it.   You may see any of the following providers on your designated Care Team at your next follow up: Marland Kitchen Dr Arvilla Meres . Dr Marca Ancona . Tonye Becket, NP . Robbie Lis, PA . Karle Plumber, PharmD   Please be sure to bring in all your medications bottles to every appointment.

## 2019-09-24 ENCOUNTER — Other Ambulatory Visit (HOSPITAL_COMMUNITY): Payer: Self-pay

## 2019-09-24 NOTE — Progress Notes (Signed)
Paramedicine Encounter    Patient ID: Richard [name], male    DOB: October 16, 1960, 59 y.o.   MRN: [id number]   Patient Care Team: Ananias Pilgrim, MD as PCP - General (Family Medicine) Laurey Morale, MD as PCP - Advanced Heart Failure (Cardiology) Burna Sis, LCSW as Social Worker (Licensed Clinical Social Worker)  Patient Active Problem List   Diagnosis Date Noted  . Acute on chronic combined systolic and diastolic CHF (congestive heart failure) (HCC) 03/15/2018  . S/P right and left heart catheterization 11/02/2017  . Panic attacks 11/02/2017  . Non-STEMI (non-ST elevated myocardial infarction) (HCC)   . COPD (chronic obstructive pulmonary disease) (HCC)   . Hypertension   . CKD (chronic kidney disease)   . 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HCC)   . ICD (implantable cardioverter-defibrillator) in place   . GERD (gastroesophageal reflux disease)   . Diabetes mellitus with diabetic neuropathy, with long-term current use of insulin (HCC)     Current Outpatient Medications:  .  acetaminophen (TYLENOL) 650 MG CR tablet, Take 650 mg by mouth every 8 (eight) hours as needed for pain., Disp: , Rfl:  .  albuterol (PROVENTIL) (2.5 MG/3ML) 0.083% nebulizer solution, Inhale into the lungs., Disp: , Rfl:  .  allopurinol (ZYLOPRIM) 100 MG tablet, Take 1 tablet (100 mg total) by mouth daily., Disp: 30 tablet, Rfl: 3 .  aspirin 81 MG chewable tablet, Chew 81 mg by mouth daily., Disp: , Rfl:  .  atorvastatin (LIPITOR) 40 MG tablet, TAKE 1 TABLET(40 MG) BY MOUTH DAILY AT 6 PM, Disp: 90 tablet, Rfl: 3 .  bisacodyl (DULCOLAX) 5 MG EC tablet, Take 5 mg by mouth daily as needed for moderate constipation., Disp: , Rfl:  .  BREO ELLIPTA 100-25 MCG/INH AEPB, Inhale 1 puff into the lungs daily., Disp: , Rfl: 5 .  digoxin (LANOXIN) 0.125 MG tablet, TAKE 1 TABLET(0.125 MG) BY MOUTH DAILY, Disp: 30 tablet, Rfl: 6 .  diphenhydrAMINE (BENADRYL) 25 MG tablet, Take 25 mg by mouth at bedtime as needed., Disp: , Rfl:  .   DULoxetine (CYMBALTA) 60 MG capsule, Take 60 mg by mouth daily., Disp: , Rfl: 2 .  eplerenone (INSPRA) 50 MG tablet, Take 1 tablet (50 mg total) by mouth daily., Disp: 30 tablet, Rfl: 5 .  hydrALAZINE (APRESOLINE) 25 MG tablet, Take 1 tablet (25 mg total) by mouth 3 (three) times daily., Disp: 270 tablet, Rfl: 3 .  Insulin Pen Needle (NOVOFINE) 30G X 8 MM MISC, Inject 10 each into the skin as needed., Disp: 100 each, Rfl: 0 .  isosorbide dinitrate (ISORDIL) 20 MG tablet, Take 1 tablet (20 mg total) by mouth 3 (three) times daily., Disp: 90 tablet, Rfl: 11 .  JARDIANCE 25 MG TABS tablet, Take 25 mg by mouth daily., Disp: , Rfl:  .  MAGNESIUM-OXIDE 400 (241.3 Mg) MG tablet, Take 1 tablet (400 mg total) by mouth daily., Disp: 30 tablet, Rfl: 11 .  omeprazole (PRILOSEC) 20 MG capsule, Take 20 mg by mouth daily., Disp: , Rfl: 1 .  pregabalin (LYRICA) 100 MG capsule, Take 1 capsule (100 mg total) by mouth 2 (two) times daily., Disp: 60 capsule, Rfl: 3 .  sacubitril-valsartan (ENTRESTO) 97-103 MG, Take 1 tablet by mouth 2 (two) times daily., Disp: 180 tablet, Rfl: 3 .  sennosides-docusate sodium (SENOKOT-S) 8.6-50 MG tablet, Take 1 tablet by mouth daily., Disp: , Rfl:  .  torsemide (DEMADEX) 20 MG tablet, Take 2 tablets (40 mg total) by mouth 2 (two) times  daily., Disp: 360 tablet, Rfl: 3 .  Vitamin D, Cholecalciferol, 1000 units CAPS, Take 1,000 Units by mouth daily., Disp: , Rfl: 11 .  baclofen (LIORESAL) 10 MG tablet, Take 1 tablet (10 mg total) by mouth 3 (three) times daily. (Patient not taking: Reported on 08/13/2019), Disp: 30 each, Rfl: 0 .  insulin regular human CONCENTRATED (HUMULIN R U-500 KWIKPEN) 500 UNIT/ML kwikpen, USE AS DIRECTED PER SLIDING SCALE. MAX DAILY DOSE IS 400 UNITS., Disp: , Rfl:  .  ranitidine (ZANTAC) 150 MG tablet, Take 150 mg by mouth 2 (two) times daily., Disp: , Rfl:  Allergies  Allergen Reactions  . Codeine   . Coconut Oil Itching      Social History    Socioeconomic History  . Marital status: Divorced    Spouse name: Not on file  . Number of children: Not on file  . Years of education: Not on file  . Highest education level: Not on file  Occupational History  . Occupation: disability    Comment: stopped working in 2000 d/t occupational exposures  Tobacco Use  . Smoking status: Former Smoker    Types: Cigarettes  . Smokeless tobacco: Never Used  . Tobacco comment: "smoked when I drank"  Substance and Sexual Activity  . 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 Drug use: Never  . Sexual activity: Not Currently  Other Topics Concern  . Not on file  Social History Narrative   Patient lives at home with wife and some of his 10 children. He self-administers his own medications.   Social Determinants of Health   Financial Resource Strain:   . Difficulty of Paying Living Expenses:   Food Insecurity:   . Worried About Programme researcher, broadcasting/film/video in the Last Year:   . Barista in the Last Year:   Transportation Needs:   . Freight forwarder (Medical):   Marland Kitchen Lack of Transportation (Non-Medical):   Physical Activity:   . Days of Exercise per Week:   . Minutes of Exercise per Session:   Stress:   . Feeling of Stress :   Social Connections:   . Frequency of Communication with Friends and Family:   . Frequency of Social Gatherings with Friends and Family:   . Attends Religious Services:   . Active Member of Clubs or Organizations:   . Attends Banker Meetings:   Marland Kitchen Marital Status:   Intimate Partner Violence:   . Fear of Current or Ex-Partner:   . Emotionally Abused:   Marland Kitchen Physically Abused:   . Sexually Abused:     Physical Exam Cardiovascular:     Rate and Rhythm: Normal rate and regular rhythm.     Pulses: Normal pulses.  Pulmonary:     Effort: Pulmonary effort is normal.     Breath sounds: Normal breath sounds.  Musculoskeletal:        General:  Normal range of motion.     Right lower leg: Edema present.     Left lower leg: Edema present.  Skin:    Capillary Refill: Capillary refill takes less than 2 seconds.  Neurological:     Mental Status: He is alert and oriented to person, place, and time.  Psychiatric:        Mood and Affect: Mood normal.         Future Appointments  Date Time Provider Department Center  10/03/2019  2:40  PM Larey Dresser, MD [facility] None  12/02/2019  8:10 AM [facility] DEVICE REMOTES [facility]  03/02/2020  8:10 AM [facility] DEVICE REMOTES [facility]  06/01/2020  8:10 AM [facility] DEVICE REMOTES [facility]    BP 123/86 (BP Location: Left Arm, Patient Position: Sitting, Cuff Size: Normal)   Pulse 86   Resp 16   Wt 262 lb (118.8 kg)   SpO2 96%   BMI 31.07 kg/m   Weight yesterday- 262 lb Last visit weight- 262.7 lb  Mr Laraia was seen at home today and reported feeling well. He denied chest pain, SOB, headache, dizziness, orthopnea, fever or cough since our last visit. He reported being compliant with his medications over the past week and his weight has been stable. His medications were verified and his pillbox was refilled. I will follow up next week. He will need to pick up medications from the pharmacy but said he would get them by next week without difficulty.  Jacquiline Doe, EMT 09/24/19  ACTION: Home visit completed Next visit planned for 1 week

## 2019-09-29 ENCOUNTER — Other Ambulatory Visit (HOSPITAL_COMMUNITY): Payer: Self-pay | Admitting: Adult Health

## 2019-10-01 ENCOUNTER — Other Ambulatory Visit (HOSPITAL_COMMUNITY): Payer: Self-pay

## 2019-10-01 NOTE — Progress Notes (Signed)
Paramedicine Encounter    Patient ID: Richard [name], male    DOB: 1960-07-17, 59 y.o.   MRN: [id number]   Patient Care Team: Ananias Pilgrim, MD as PCP - General (Family Medicine) Laurey Morale, MD as PCP - Advanced Heart Failure (Cardiology) Burna Sis, LCSW as Social Worker (Licensed Clinical Social Worker)  Patient Active Problem List   Diagnosis Date Noted  . Acute on chronic combined systolic and diastolic CHF (congestive heart failure) (HCC) 03/15/2018  . S/P right and left heart catheterization 11/02/2017  . Panic attacks 11/02/2017  . Non-STEMI (non-ST elevated myocardial infarction) (HCC)   . COPD (chronic obstructive pulmonary disease) (HCC)   . Hypertension   . CKD (chronic kidney disease)   . 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HCC)   . ICD (implantable cardioverter-defibrillator) in place   . GERD (gastroesophageal reflux disease)   . Diabetes mellitus with diabetic neuropathy, with long-term current use of insulin (HCC)     Current Outpatient Medications:  .  acetaminophen (TYLENOL) 650 MG CR tablet, Take 650 mg by mouth every 8 (eight) hours as needed for pain., Disp: , Rfl:  .  albuterol (PROVENTIL) (2.5 MG/3ML) 0.083% nebulizer solution, Inhale into the lungs., Disp: , Rfl:  .  allopurinol (ZYLOPRIM) 100 MG tablet, Take 1 tablet (100 mg total) by mouth daily., Disp: 30 tablet, Rfl: 3 .  aspirin 81 MG chewable tablet, Chew 81 mg by mouth daily., Disp: , Rfl:  .  atorvastatin (LIPITOR) 40 MG tablet, TAKE 1 TABLET(40 MG) BY MOUTH DAILY AT 6 PM, Disp: 90 tablet, Rfl: 3 .  bisacodyl (DULCOLAX) 5 MG EC tablet, Take 5 mg by mouth daily as needed for moderate constipation., Disp: , Rfl:  .  BREO ELLIPTA 100-25 MCG/INH AEPB, Inhale 1 puff into the lungs daily., Disp: , Rfl: 5 .  digoxin (LANOXIN) 0.125 MG tablet, TAKE 1 TABLET(0.125 MG) BY MOUTH DAILY, Disp: 30 tablet, Rfl: 6 .  diphenhydrAMINE (BENADRYL) 25 MG tablet, Take 25 mg by mouth at bedtime as needed., Disp: , Rfl:  .   DULoxetine (CYMBALTA) 60 MG capsule, Take 60 mg by mouth daily., Disp: , Rfl: 2 .  eplerenone (INSPRA) 50 MG tablet, Take 1 tablet (50 mg total) by mouth daily., Disp: 30 tablet, Rfl: 5 .  hydrALAZINE (APRESOLINE) 25 MG tablet, Take 1 tablet (25 mg total) by mouth 3 (three) times daily., Disp: 270 tablet, Rfl: 3 .  Insulin Pen Needle (NOVOFINE) 30G X 8 MM MISC, Inject 10 each into the skin as needed., Disp: 100 each, Rfl: 0 .  insulin regular human CONCENTRATED (HUMULIN R U-500 KWIKPEN) 500 UNIT/ML kwikpen, USE AS DIRECTED PER SLIDING SCALE. MAX DAILY DOSE IS 400 UNITS., Disp: , Rfl:  .  isosorbide dinitrate (ISORDIL) 20 MG tablet, Take 1 tablet (20 mg total) by mouth 3 (three) times daily., Disp: 90 tablet, Rfl: 11 .  JARDIANCE 25 MG TABS tablet, Take 25 mg by mouth daily., Disp: , Rfl:  .  MAGNESIUM-OXIDE 400 (241.3 Mg) MG tablet, Take 1 tablet (400 mg total) by mouth daily., Disp: 30 tablet, Rfl: 11 .  omeprazole (PRILOSEC) 20 MG capsule, Take 20 mg by mouth daily., Disp: , Rfl: 1 .  pregabalin (LYRICA) 100 MG capsule, Take 1 capsule (100 mg total) by mouth 2 (two) times daily., Disp: 60 capsule, Rfl: 3 .  sacubitril-valsartan (ENTRESTO) 97-103 MG, Take 1 tablet by mouth 2 (two) times daily., Disp: 180 tablet, Rfl: 3 .  sennosides-docusate sodium (SENOKOT-S) 8.6-50 MG  tablet, Take 1 tablet by mouth daily., Disp: , Rfl:  .  torsemide (DEMADEX) 20 MG tablet, TAKE 2 TABLETS BY MOUTH TWICE DAILY, Disp: 360 tablet, Rfl: 3 .  Vitamin D, Cholecalciferol, 1000 units CAPS, Take 1,000 Units by mouth daily., Disp: , Rfl: 11 .  baclofen (LIORESAL) 10 MG tablet, Take 1 tablet (10 mg total) by mouth 3 (three) times daily. (Patient not taking: Reported on 08/13/2019), Disp: 30 each, Rfl: 0 .  ranitidine (ZANTAC) 150 MG tablet, Take 150 mg by mouth 2 (two) times daily., Disp: , Rfl:  Allergies  Allergen Reactions  . Codeine   . Coconut Oil Itching      Social History   Socioeconomic History  . Marital  status: Divorced    Spouse name: Not on file  . Number of children: Not on file  . Years of education: Not on file  . Highest education level: Not on file  Occupational History  . Occupation: disability    Comment: stopped working in 2000 d/t occupational exposures  Tobacco Use  . Smoking status: Former Smoker    Types: Cigarettes  . Smokeless tobacco: Never Used  . Tobacco comment: "smoked when I drank"  Substance and Sexual Activity  . 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 Drug use: Never  . Sexual activity: Not Currently  Other Topics Concern  . Not on file  Social History Narrative   Patient lives at home with wife and some of his 10 children. He self-administers his own medications.   Social Determinants of Health   Financial Resource Strain:   . Difficulty of Paying Living Expenses:   Food Insecurity:   . Worried About Programme researcher, broadcasting/film/video in the Last Year:   . Barista in the Last Year:   Transportation Needs:   . Freight forwarder (Medical):   Marland Kitchen Lack of Transportation (Non-Medical):   Physical Activity:   . Days of Exercise per Week:   . Minutes of Exercise per Session:   Stress:   . Feeling of Stress :   Social Connections:   . Frequency of Communication with Friends and Family:   . Frequency of Social Gatherings with Friends and Family:   . Attends Religious Services:   . Active Member of Clubs or Organizations:   . Attends Banker Meetings:   Marland Kitchen Marital Status:   Intimate Partner Violence:   . Fear of Current or Ex-Partner:   . Emotionally Abused:   Marland Kitchen Physically Abused:   . Sexually Abused:     Physical Exam Cardiovascular:     Rate and Rhythm: Normal rate and regular rhythm.     Pulses: Normal pulses.  Pulmonary:     Effort: Pulmonary effort is normal.     Breath sounds: Normal breath sounds.  Musculoskeletal:        General: Normal range of motion.     Right lower  leg: No edema.     Left lower leg: No edema.  Skin:    General: Skin is warm and dry.     Capillary Refill: Capillary refill takes less than 2 seconds.  Neurological:     Mental Status: He is alert and oriented to person, place, and time.  Psychiatric:        Mood and Affect: Mood normal.         Future Appointments  Date Time Provider Department  Center  10/03/2019  2:40 PM Larey Dresser, MD [facility] None  12/02/2019  8:10 AM [facility] DEVICE REMOTES [facility]  03/02/2020  8:10 AM [facility] DEVICE REMOTES [facility]  06/01/2020  8:10 AM [facility] DEVICE REMOTES [facility]    BP (!) 84/57 (BP Location: Left Arm, Patient Position: Sitting, Cuff Size: Large)   Pulse 92   Resp 18   Wt 260 lb (117.9 kg)   SpO2 95%   BMI 30.83 kg/m   Weight yesterday- 258 lb Last visit weight- 262 lb  Mr Bohlken was seen at home today and reported feeling dizzy and generally tired. He denied chest pain, SOB, headache, orthopnea, fever or cough and stated his fatigue and dizziness only began about an hour prior to my arrival. He stated he had taken his morning medications around 07:00 but had not eaten much today and did not take his afternoon medicine. He was found to be hypotensive so I contacted the HF clinic who advised to hold the evening dose of Entresto and see if he feels better tomorrow. If his dizziness persists into tomorrow, he was instructed to let myself or the clinic know. His medications were verified and his pillbox was refilled with the aforementioned omission.  He has an appointment in the HF clinic on Friday so barring anything further I will follow up there.   Jacquiline Doe, EMT 10/01/19  ACTION: Home visit completed Next visit planned for Friday at his clinic appointment.

## 2019-10-03 ENCOUNTER — Encounter (HOSPITAL_COMMUNITY): Payer: Self-pay | Admitting: Cardiology

## 2019-10-03 ENCOUNTER — Other Ambulatory Visit (HOSPITAL_COMMUNITY): Payer: Self-pay

## 2019-10-03 ENCOUNTER — Ambulatory Visit (HOSPITAL_COMMUNITY)
Admission: RE | Admit: 2019-10-03 | Discharge: 2019-10-03 | Disposition: A | Discharge status: Home / Self Care | Payer: Medicare Other | Source: Ambulatory Visit | Attending: Cardiology | Admitting: Cardiology

## 2019-10-03 ENCOUNTER — Other Ambulatory Visit: Payer: Self-pay

## 2019-10-03 VITALS — BP 110/68 | HR 74 | Wt 269.4 lb

## 2019-10-03 DIAGNOSIS — Z7951 Long term (current) use of inhaled steroids: Secondary | ICD-10-CM | POA: Insufficient documentation

## 2019-10-03 DIAGNOSIS — I2510 Atherosclerotic heart disease of native coronary artery without angina pectoris: Secondary | ICD-10-CM | POA: Diagnosis not present

## 2019-10-03 DIAGNOSIS — Z7982 Long term (current) use of aspirin: Secondary | ICD-10-CM | POA: Diagnosis not present

## 2019-10-03 DIAGNOSIS — L97509 Non-pressure chronic ulcer of other part of unspecified foot with unspecified severity: Secondary | ICD-10-CM | POA: Insufficient documentation

## 2019-10-03 DIAGNOSIS — E1122 Type 2 diabetes mellitus with diabetic chronic kidney disease: Secondary | ICD-10-CM | POA: Diagnosis not present

## 2019-10-03 DIAGNOSIS — E1142 Type 2 diabetes mellitus with diabetic polyneuropathy: Secondary | ICD-10-CM | POA: Diagnosis not present

## 2019-10-03 DIAGNOSIS — G4733 Obstructive sleep apnea (adult) (pediatric): Secondary | ICD-10-CM | POA: Diagnosis not present

## 2019-10-03 DIAGNOSIS — I5022 Chronic systolic (congestive) heart failure: Secondary | ICD-10-CM

## 2019-10-03 DIAGNOSIS — Z794 Long term (current) use of insulin: Secondary | ICD-10-CM | POA: Diagnosis not present

## 2019-10-03 DIAGNOSIS — I428 Other cardiomyopathies: Secondary | ICD-10-CM | POA: Insufficient documentation

## 2019-10-03 DIAGNOSIS — Z9981 Dependence on supplemental oxygen: Secondary | ICD-10-CM | POA: Insufficient documentation

## 2019-10-03 DIAGNOSIS — K219 Gastro-esophageal reflux disease without esophagitis: Secondary | ICD-10-CM | POA: Diagnosis not present

## 2019-10-03 DIAGNOSIS — Z8249 Family history of ischemic heart disease and other diseases of the circulatory system: Secondary | ICD-10-CM | POA: Insufficient documentation

## 2019-10-03 DIAGNOSIS — Z95810 Presence of automatic (implantable) cardiac defibrillator: Secondary | ICD-10-CM | POA: Insufficient documentation

## 2019-10-03 DIAGNOSIS — Z885 Allergy status to narcotic agent status: Secondary | ICD-10-CM | POA: Diagnosis not present

## 2019-10-03 DIAGNOSIS — E785 Hyperlipidemia, unspecified: Secondary | ICD-10-CM | POA: Insufficient documentation

## 2019-10-03 DIAGNOSIS — I5043 Acute on chronic combined systolic (congestive) and diastolic (congestive) heart failure: Secondary | ICD-10-CM

## 2019-10-03 DIAGNOSIS — D751 Secondary polycythemia: Secondary | ICD-10-CM | POA: Diagnosis not present

## 2019-10-03 DIAGNOSIS — N183 Chronic kidney disease, stage 3 unspecified: Secondary | ICD-10-CM | POA: Diagnosis not present

## 2019-10-03 DIAGNOSIS — Z87891 Personal history of nicotine dependence: Secondary | ICD-10-CM | POA: Insufficient documentation

## 2019-10-03 DIAGNOSIS — Z833 Family history of diabetes mellitus: Secondary | ICD-10-CM | POA: Insufficient documentation

## 2019-10-03 DIAGNOSIS — I130 Hypertensive heart and chronic kidney disease with heart failure and stage 1 through stage 4 chronic kidney disease, or unspecified chronic kidney disease: Secondary | ICD-10-CM | POA: Insufficient documentation

## 2019-10-03 DIAGNOSIS — Z955 Presence of coronary angioplasty implant and graft: Secondary | ICD-10-CM | POA: Insufficient documentation

## 2019-10-03 DIAGNOSIS — E11621 Type 2 diabetes mellitus with foot ulcer: Secondary | ICD-10-CM | POA: Insufficient documentation

## 2019-10-03 DIAGNOSIS — Z79899 Other long term (current) drug therapy: Secondary | ICD-10-CM | POA: Diagnosis not present

## 2019-10-03 DIAGNOSIS — J449 Chronic obstructive pulmonary disease, unspecified: Secondary | ICD-10-CM | POA: Insufficient documentation

## 2019-10-03 LAB — BASIC METABOLIC PANEL WITH GFR
Anion gap: 12 (ref 5–15)
BUN: 41 mg/dL — ABNORMAL HIGH (ref 6–20)
CO2: 27 mmol/L (ref 22–32)
Calcium: 9.2 mg/dL (ref 8.9–10.3)
Chloride: 100 mmol/L (ref 98–111)
Creatinine, Ser: 1.81 mg/dL — ABNORMAL HIGH (ref 0.61–1.24)
GFR calc Af Amer: 47 mL/min — ABNORMAL LOW
GFR calc non Af Amer: 41 mL/min — ABNORMAL LOW
Glucose, Bld: 140 mg/dL — ABNORMAL HIGH (ref 70–99)
Potassium: 4.2 mmol/L (ref 3.5–5.1)
Sodium: 139 mmol/L (ref 135–145)

## 2019-10-03 LAB — LIPID PANEL
Cholesterol: 148 mg/dL (ref 0–200)
HDL: 27 mg/dL — ABNORMAL LOW
LDL Cholesterol: UNDETERMINED mg/dL (ref 0–99)
Total CHOL/HDL Ratio: 5.5 ratio
Triglycerides: 475 mg/dL — ABNORMAL HIGH
VLDL: UNDETERMINED mg/dL (ref 0–40)

## 2019-10-03 LAB — DIGOXIN LEVEL: Digoxin Level: 0.2 ng/mL — ABNORMAL LOW (ref 0.8–2.0)

## 2019-10-03 LAB — LDL CHOLESTEROL, DIRECT: Direct LDL: 66.8 mg/dL (ref 0–99)

## 2019-10-03 MED ORDER — BISOPROLOL FUMARATE 5 MG PO TABS: 2.5000 mg | ORAL_TABLET | Freq: Every day | ORAL | 5 refills | Status: DC

## 2019-10-03 NOTE — Patient Instructions (Signed)
START Bisoprolol 2.5mg  (1/2 tab) daily   Labs today We will only contact you if something comes back abnormal or we need to make some changes. Otherwise no news is good news!   You have been ordered a PYP Scan.  This is done in the Radiology Department of [facility].  When you come for this test please plan to be there 2-3 hours. Once authorized by insurance, you will get a call to schedule this appointment.    Your physician recommends that you schedule a follow-up appointment in: 3 weeks with the Pharmacy and 3 months with Dr Shirlee Latch.   Please call office at [phone] option [phone] if you have any questions or concerns.    At the [facility], you and your health needs are our priority. As part of our continuing mission to provide you with exceptional heart care, we have created designated Provider Care Teams. These Care Teams include your primary Cardiologist (physician) and Advanced Practice Providers (APPs- Physician Assistants and Nurse Practitioners) who all work together to provide you with the care you need, when you need it.   You may see any of the following providers on your designated Care Team at your next follow up: Marland Kitchen Dr Arvilla Meres . Dr Marca Ancona . Tonye Becket, NP . Robbie Lis, PA . Karle Plumber, PharmD   Please be sure to bring in all your medications bottles to every appointment.

## 2019-10-03 NOTE — Progress Notes (Signed)
Paramedicine Encounter   Patient ID: Richard [name] , male,   DOB: Jun 16, 1961,58 y.o.,  MRN: [id number]  Mr Richard [name] was seen in the clinic with Dr Shirlee Latch today. Per Dr Shirlee Latch he is to add Bisoprolol to his medication regimen. Additionally he is going to test for amyloid due to the extreme nature of his neuropathy. Mr Richard [name] was understanding and agreeable and I will follow up next week.   [name], EMT 10/03/2019   ACTION: Next visit planned for 1 week

## 2019-10-05 NOTE — Progress Notes (Signed)
Advanced [facility] Clinic Note   PCP: Ananias Pilgrim, MD Cardiologist: Dr Shirlee Latch  HPI: Richard [name] is a 59 y.o. male with a history of chronic systolic HF s/p Medtronic ICD (12/2016), HTN, DM, and HLD.   Admitted 5/8-5/14/19 with A/C systolic HF. Echo showed EF 15-20%. Advanced HF team was consulted. He diuresed 20 lbs with IV lasix, then transitioned to torsemide 40 mg daily. Underwent R/LHC showing a focal severe RCA stenosis treated with DES.  He did not, however, appear to have severe enough coronary disease to explain his cardiomyopathy. Cardiac index was low at 1.9.  HF meds were optimized. Beta blocker was not started due to low CI and soft BPs. He was discharged on home oxygen. Referred to cardiac rehab. DC weight: 260 lbs.   Echo in 2/20 showed EF 20-25% with severe LV dilation, moderate RV enlargement with moderately decreased RV systolic function.  CPX in 2/21 showed a severe functional limitation but it actually appeared to be primarily due to pulmonary issues.   He presents today for followup of CHF.  Weight is up 7 lbs from last appointment.  No dyspnea walking on flat ground.  He can walk up a flight of stairs without problems.  No orthopnea/paroxysmal nocturnal dyspnea.  No chest pain.  Rare lightheadedness if he stands too fast. He has a wound on his left foot, ABIs done via PCP, per his report they were normal.   Labs (10/19): K 3.9, creatinine 1.18  Labs (10/20): BNP 160, K 3.8, creatinine 1.31  ECG (personally reviewed): NSR, anterior and lateral Qs  Medtronic device interrogation: No VT, stable thoracic impedance.   PMH: 1. Chronic systolic CHF: Primarily nonischemic cardiomyopathy.  Has Medtronic ICD.   - Echo 5/19: EF 15-20%, grade 2 DD, mild MR, RV severely reduced, LA moderately dilated, RA mildly dilated, mild TR.  - RHC/LHC (5/19): 80% distal LAD stenosis, 80% proximal RCA stenosis treated with DES to pRCA. Mean RA 13, PA 69/31 mean 47, mean 24, CI 1.91, PVR  4.8 WU.  - Gynecomastia with spironolactone. - Echo (2/20): EF 20-25%, severe LV enlargement, moderate RV enlargement, moderately decreased RV systolic function.  - CPX (2/21): peak VO2 13.2, VE/VCO2 slope 24, RER 1.10. Severe functional limitation due to OHS with severe restrictive lung disease without significant HF limitation.  2. COPD: Uses oxygen at night. Never smoked, but apparently had significant occupational exposure. It appears that he won a lawsuit dealing with the occupational exposure-related COPD.  3. CAD: Cath in 5/17 with 60% RCA stenosis.  - LHC (5/19): 80% distal LAD, 80% proximal RCA.  DES to proximal RCA.  4. Polycythemia: Probably due to chronic hypoxemia.  5. Type II diabetes.  6. HTN 7. GERD 8. CKD: Stage 3.  Likely due to diabetes and HTN.  9. OSA: Unable to tolerate CPAP.   Review of systems complete and found to be negative unless listed in HPI.    Current Outpatient Medications  Medication Sig Dispense Refill  . acetaminophen (TYLENOL) 650 MG CR tablet Take 650 mg by mouth every 8 (eight) hours as needed for pain.    Marland Kitchen albuterol (PROVENTIL) (2.5 MG/3ML) 0.083% nebulizer solution Inhale into the lungs.    Marland Kitchen allopurinol (ZYLOPRIM) 100 MG tablet Take 1 tablet (100 mg total) by mouth daily. 30 tablet 3  . aspirin 81 MG chewable tablet Chew 81 mg by mouth daily.    Marland Kitchen atorvastatin (LIPITOR) 40 MG tablet TAKE 1 TABLET(40 MG) BY MOUTH DAILY AT 6  PM 90 tablet 3  . bisacodyl (DULCOLAX) 5 MG EC tablet Take 5 mg by mouth daily as needed for moderate constipation.    Marland Kitchen BREO ELLIPTA 100-25 MCG/INH AEPB Inhale 1 puff into the lungs daily.  5  . digoxin (LANOXIN) 0.125 MG tablet TAKE 1 TABLET(0.125 MG) BY MOUTH DAILY 30 tablet 6  . diphenhydrAMINE (BENADRYL) 25 MG tablet Take 25 mg by mouth at bedtime as needed.    . DULoxetine (CYMBALTA) 60 MG capsule Take 60 mg by mouth daily.  2  . eplerenone (INSPRA) 50 MG tablet Take 1 tablet (50 mg total) by mouth daily. 30 tablet 5  .  hydrALAZINE (APRESOLINE) 25 MG tablet Take 1 tablet (25 mg total) by mouth 3 (three) times daily. 270 tablet 3  . Insulin Pen Needle (NOVOFINE) 30G X 8 MM MISC Inject 10 each into the skin as needed. 100 each 0  . insulin regular human CONCENTRATED (HUMULIN R U-500 KWIKPEN) 500 UNIT/ML kwikpen USE AS DIRECTED PER SLIDING SCALE. MAX DAILY DOSE IS 400 UNITS.    Marland Kitchen isosorbide dinitrate (ISORDIL) 20 MG tablet Take 1 tablet (20 mg total) by mouth 3 (three) times daily. 90 tablet 11  . JARDIANCE 25 MG TABS tablet Take 25 mg by mouth daily.    Marland Kitchen MAGNESIUM-OXIDE 400 (241.3 Mg) MG tablet Take 1 tablet (400 mg total) by mouth daily. 30 tablet 11  . omeprazole (PRILOSEC) 20 MG capsule Take 20 mg by mouth daily.  1  . pregabalin (LYRICA) 100 MG capsule Take 1 capsule (100 mg total) by mouth 2 (two) times daily. 60 capsule 3  . ranitidine (ZANTAC) 150 MG tablet Take 150 mg by mouth 2 (two) times daily.    . sacubitril-valsartan (ENTRESTO) 97-103 MG Take 1 tablet by mouth 2 (two) times daily. 180 tablet 3  . sennosides-docusate sodium (SENOKOT-S) 8.6-50 MG tablet Take 1 tablet by mouth daily.    Marland Kitchen torsemide (DEMADEX) 20 MG tablet TAKE 2 TABLETS BY MOUTH TWICE DAILY 360 tablet 3  . Vitamin D, Cholecalciferol, 1000 units CAPS Take 1,000 Units by mouth daily.  11  . bisoprolol (ZEBETA) 5 MG tablet Take 0.5 tablets (2.5 mg total) by mouth daily. 15 tablet 5   No current facility-administered medications for this encounter.   Allergies  Allergen Reactions  . Codeine   . Coconut Oil Itching    Social History   Socioeconomic History  . Marital status: Divorced    Spouse name: Not on file  . Number of children: Not on file  . Years of education: Not on file  . Highest education level: Not on file  Occupational History  . Occupation: disability    Comment: stopped working in 2000 d/t occupational exposures  Tobacco Use  . Smoking status: Former Smoker    Types: Cigarettes  . Smokeless tobacco: Never  Used  . Tobacco comment: "smoked when I drank"  Substance and Sexual Activity  . 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 Drug use: Never  . Sexual activity: Not Currently  Other Topics Concern  . Not on file  Social History Narrative   Patient lives at home with wife and some of his 10 children. He self-administers his own medications.   Social Determinants of Health   Financial Resource Strain:   . Difficulty of Paying Living Expenses:   Food Insecurity:   . Worried About Programme researcher, broadcasting/film/video in the  Last Year:   . Ran Out of Food in the Last Year:   Transportation Needs:   . Freight forwarder (Medical):   Marland Kitchen Lack of Transportation (Non-Medical):   Physical Activity:   . Days of Exercise per Week:   . Minutes of Exercise per Session:   Stress:   . Feeling of Stress :   Social Connections:   . Frequency of Communication with Friends and Family:   . Frequency of Social Gatherings with Friends and Family:   . Attends Religious Services:   . Active Member of Clubs or Organizations:   . Attends Banker Meetings:   Marland Kitchen Marital Status:   Intimate Partner Violence:   . Fear of Current or Ex-Partner:   . Emotionally Abused:   Marland Kitchen Physically Abused:   . Sexually Abused:    Family History  Problem Relation Age of Onset  . Hypertension Mother   . Diabetes Mother   . Hypertension Father   . Diabetes Father   . Diabetes Sister   . Diabetes Brother    Vitals:   10/03/19 1447  BP: 110/68  Pulse: 74  SpO2: 98%  Weight: 122.2 kg (269 lb 6.4 oz)     Wt Readings from Last 3 Encounters:  10/03/19 122.2 kg (269 lb 6.4 oz)  10/01/19 117.9 kg (260 lb)  09/24/19 118.8 kg (262 lb)   PHYSICAL EXAM: General: NAD Neck: No JVD, no thyromegaly or thyroid nodule.  Lungs: Clear to auscultation bilaterally with normal respiratory effort. CV: Nondisplaced PMI.  Heart regular S1/S2, no S3/S4, no murmur.  No peripheral  edema.  No carotid bruit.  Unable to palpate pedal pulses.  Abdomen: Soft, nontender, no hepatosplenomegaly, no distention.  Skin: Left foot wrapped.  Neurologic: Alert and oriented x 3.  Psych: Normal affect. Extremities: No clubbing or cyanosis.  HEENT: Normal.   ASSESSMENT & PLAN:  1. Chronic systolic CHF:  Primarily nonischemic cardiomyopathy. Echo in 2017 with EF 15-20%. Medtronic ICD. Echo 10/2017 EF 15-20%, severely dilated RV with severely decreased systolic function. LHC/RHC 11/06/17 showed volume overload with 80% RCA stenosis.  Cardiac index low at 1.91. The degree of coronary disease does not explain his cardiomyopathy.  Echo in 2/20 showed severe LV dilation, EF 20-25%, moderately decreased RV systolic function.  NYHA class II symptoms, he is not volume overloaded by exam or Optivol.  - Continue torsemide 40 mg bid.  BMET today.  - Continue entresto 97/103 mg BID.  - Continue eplerenone 50 mg daily.   - Continue hydralazine 25 mg tid and isordil to 20 tid.   - Continue digoxin 0.125 mg daily, check level today.  - Add bisoprolol 2.5 mg daily.  - Continue empagliflozin.  - He has HF and severe peripheral neuropathy.  Neuropathy is likely due to diabetes, but I think it would be reasonable to rule out evidence for amyloidosis. I will check myeloma panel, urine immunofixation, and PYP scan.  2. COPD: Never smoked, but apparently had significant occupational exposure. It appears that he won a lawsuit dealing with the occupational exposure-related COPD. CPX in 2/21 showed severe functional limitation, but it appeared to be due to pulmonary abnormalities rather than HF.  - Followup with pulmonary.  3. CAD:  LHC in 5/19 with 80-90% proximal RCA stenosis treated with DES to RCA.  This did not cause his cardiomyopathy but is a large RCA.  No chest pain.  - Continue ASA 81 daily.  - Continue atorvastatin, check lipids today.  4. Polycythemia: Likely related to chronic hypoxia.  5. OSA:  Cannot tolerate CPAP, wears oxygen at night.  6. DM: He is on empagliflozin.   7. Ulcer: Slow to heal foot ulcer.  Per his report, ABIs ordered by PCP were normal.   Continue paramedicine.  See HF pharmacist in 3 wks for med titration.  See me in 3 months. Loralie Champagne 10/05/2019

## 2019-10-06 LAB — IMMUNOFIXATION, URINE

## 2019-10-07 ENCOUNTER — Telehealth (HOSPITAL_COMMUNITY): Payer: Self-pay | Admitting: Pharmacist

## 2019-10-07 ENCOUNTER — Telehealth (HOSPITAL_COMMUNITY): Payer: Self-pay

## 2019-10-07 DIAGNOSIS — I5022 Chronic systolic (congestive) heart failure: Secondary | ICD-10-CM

## 2019-10-07 MED ORDER — VASCEPA 1 G PO CAPS: 2.0000 g | ORAL_CAPSULE | Freq: Two times a day (BID) | ORAL | 5 refills | Status: DC

## 2019-10-07 MED ORDER — ICOSAPENT ETHYL 1 G PO CAPS: 2.0000 g | ORAL_CAPSULE | Freq: Two times a day (BID) | ORAL | 5 refills | Status: DC

## 2019-10-07 MED ORDER — TORSEMIDE 20 MG PO TABS: 40.0000 mg | ORAL_TABLET | Freq: Every day | ORAL | 3 refills | Status: DC

## 2019-10-07 NOTE — Telephone Encounter (Signed)
-----   Message from Laurey Morale, MD sent at 10/06/2019 12:20 AM EDT ----- Decrease torsemide to 40 mg daily, repeat BMET 1 week.  Start Vascepa 2 g bid.  If he cannot get Vascepa, start fenofibrate 145 mg daily.  Needs lipids in 2 months.

## 2019-10-07 NOTE — Telephone Encounter (Signed)
I called mr Richard [name] to schedule an appointment and make him aware of a new medication he is starting. His wife answered and said he had just gone to the store but she was able to confirm an appointment tomorrow at 16:00. She also said she would let him know to pick up his new medication from the pharmacy tomorrow.   [name], EMT 10/07/19

## 2019-10-07 NOTE — Telephone Encounter (Signed)
Spoke with Ian Malkin, advised of med changes.  He will see patient tomorrow and review instructions with him. Ian Malkin will call office tomorrow to schedule appointments for next week and 2 months.

## 2019-10-07 NOTE — Telephone Encounter (Signed)
Sent in prescription for [facility] as insurance prefers brand, not generic.

## 2019-10-08 ENCOUNTER — Other Ambulatory Visit (HOSPITAL_COMMUNITY): Payer: Self-pay

## 2019-10-08 NOTE — Progress Notes (Signed)
Paramedicine Encounter    Patient ID: Richard [name], male    DOB: 12/12/1960, 59 y.o.   MRN: [id number]   Patient Care Team: Wallene Dales, MD as PCP - General (Family Medicine) Larey Dresser, MD as PCP - Advanced Heart Failure (Cardiology) Jorge Ny, LCSW as Social Worker (Licensed Clinical Social Worker)  Patient Active Problem List   Diagnosis Date Noted  . Acute on chronic combined systolic and diastolic CHF (congestive heart failure) ([place]) 03/15/2018  . S/P right and left heart catheterization 11/02/2017  . Panic attacks 11/02/2017  . Non-STEMI (non-ST elevated myocardial infarction) ([place])   . COPD (chronic obstructive pulmonary disease) ([place])   . Hypertension   . CKD (chronic kidney disease)   . 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[place])   . ICD (implantable cardioverter-defibrillator) in place   . GERD (gastroesophageal reflux disease)   . Diabetes mellitus with diabetic neuropathy, with long-term current use of insulin (HCC)     Current Outpatient Medications:  .  acetaminophen (TYLENOL) 650 MG CR tablet, Take 650 mg by mouth every 8 (eight) hours as needed for pain., Disp: , Rfl:  .  albuterol (PROVENTIL) (2.5 MG/3ML) 0.083% nebulizer solution, Inhale into the lungs., Disp: , Rfl:  .  allopurinol (ZYLOPRIM) 100 MG tablet, Take 1 tablet (100 mg total) by mouth daily., Disp: 30 tablet, Rfl: 3 .  aspirin 81 MG chewable tablet, Chew 81 mg by mouth daily., Disp: , Rfl:  .  atorvastatin (LIPITOR) 40 MG tablet, TAKE 1 TABLET(40 MG) BY MOUTH DAILY AT 6 PM, Disp: 90 tablet, Rfl: 3 .  bisacodyl (DULCOLAX) 5 MG EC tablet, Take 5 mg by mouth daily as needed for moderate constipation., Disp: , Rfl:  .  bisoprolol (ZEBETA) 5 MG tablet, Take 0.5 tablets (2.5 mg total) by mouth daily., Disp: 15 tablet, Rfl: 5 .  BREO ELLIPTA 100-25 MCG/INH AEPB, Inhale 1 puff into the lungs daily., Disp: , Rfl: 5 .  digoxin (LANOXIN) 0.125 MG tablet, TAKE 1 TABLET(0.125 MG) BY MOUTH DAILY, Disp: 30 tablet, Rfl:  6 .  diphenhydrAMINE (BENADRYL) 25 MG tablet, Take 25 mg by mouth at bedtime as needed., Disp: , Rfl:  .  DULoxetine (CYMBALTA) 60 MG capsule, Take 60 mg by mouth daily., Disp: , Rfl: 2 .  eplerenone (INSPRA) 50 MG tablet, Take 1 tablet (50 mg total) by mouth daily., Disp: 30 tablet, Rfl: 5 .  hydrALAZINE (APRESOLINE) 25 MG tablet, Take 1 tablet (25 mg total) by mouth 3 (three) times daily., Disp: 270 tablet, Rfl: 3 .  insulin regular human CONCENTRATED (HUMULIN R U-500 KWIKPEN) 500 UNIT/ML kwikpen, USE AS DIRECTED PER SLIDING SCALE. MAX DAILY DOSE IS 400 UNITS., Disp: , Rfl:  .  isosorbide dinitrate (ISORDIL) 20 MG tablet, Take 1 tablet (20 mg total) by mouth 3 (three) times daily., Disp: 90 tablet, Rfl: 11 .  JARDIANCE 25 MG TABS tablet, Take 25 mg by mouth daily., Disp: , Rfl:  .  MAGNESIUM-OXIDE 400 (241.3 Mg) MG tablet, Take 1 tablet (400 mg total) by mouth daily., Disp: 30 tablet, Rfl: 11 .  omeprazole (PRILOSEC) 20 MG capsule, Take 20 mg by mouth daily., Disp: , Rfl: 1 .  pregabalin (LYRICA) 100 MG capsule, Take 1 capsule (100 mg total) by mouth 2 (two) times daily., Disp: 60 capsule, Rfl: 3 .  sacubitril-valsartan (ENTRESTO) 97-103 MG, Take 1 tablet by mouth 2 (two) times daily., Disp: 180 tablet, Rfl: 3 .  sennosides-docusate sodium (SENOKOT-S) 8.6-50 MG tablet, Take 1  tablet by mouth daily., Disp: , Rfl:  .  torsemide (DEMADEX) 20 MG tablet, Take 2 tablets (40 mg total) by mouth daily., Disp: 180 tablet, Rfl: 3 .  VASCEPA 1 g capsule, Take 2 capsules (2 g total) by mouth 2 (two) times daily., Disp: 120 capsule, Rfl: 5 .  Vitamin D, Cholecalciferol, 1000 units CAPS, Take 1,000 Units by mouth daily., Disp: , Rfl: 11 .  Insulin Pen Needle (NOVOFINE) 30G X 8 MM MISC, Inject 10 each into the skin as needed., Disp: 100 each, Rfl: 0 .  ranitidine (ZANTAC) 150 MG tablet, Take 150 mg by mouth 2 (two) times daily., Disp: , Rfl:  Allergies  Allergen Reactions  . Codeine   . Coconut Oil Itching       Social History   Socioeconomic History  . Marital status: Divorced    Spouse name: Not on file  . Number of children: Not on file  . Years of education: Not on file  . Highest education level: Not on file  Occupational History  . Occupation: disability    Comment: stopped working in 2000 d/t occupational exposures  Tobacco Use  . Smoking status: Former Smoker    Types: Cigarettes  . Smokeless tobacco: Never Used  . Tobacco comment: "smoked when I drank"  Substance and Sexual Activity  . 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 Drug use: Never  . Sexual activity: Not Currently  Other Topics Concern  . Not on file  Social History Narrative   Patient lives at home with wife and some of his 10 children. He self-administers his own medications.   Social Determinants of Health   Financial Resource Strain:   . Difficulty of Paying Living Expenses:   Food Insecurity:   . Worried About Programme researcher, broadcasting/film/video in the Last Year:   . Barista in the Last Year:   Transportation Needs:   . Freight forwarder (Medical):   Marland Kitchen Lack of Transportation (Non-Medical):   Physical Activity:   . Days of Exercise per Week:   . Minutes of Exercise per Session:   Stress:   . Feeling of Stress :   Social Connections:   . Frequency of Communication with Friends and Family:   . Frequency of Social Gatherings with Friends and Family:   . Attends Religious Services:   . Active Member of Clubs or Organizations:   . Attends Banker Meetings:   Marland Kitchen Marital Status:   Intimate Partner Violence:   . Fear of Current or Ex-Partner:   . Emotionally Abused:   Marland Kitchen Physically Abused:   . Sexually Abused:     Physical Exam Cardiovascular:     Rate and Rhythm: Normal rate and regular rhythm.     Pulses: Normal pulses.  Pulmonary:     Effort: Pulmonary effort is normal.     Breath sounds: Normal breath sounds.   Musculoskeletal:        General: Normal range of motion.     Right lower leg: No edema.     Left lower leg: No edema.  Skin:    General: Skin is warm and dry.     Capillary Refill: Capillary refill takes less than 2 seconds.  Neurological:     Mental Status: He is alert and oriented to person, place, and time.  Psychiatric:        Mood and Affect: Mood  normal.         Future Appointments  Date Time Provider Department Center  10/24/2019 11:00 AM [facility]J 1 [facility]  10/24/2019 12:00 PM [facility] 1 [facility]  10/27/2019  2:00 PM [facility] PHARMACY [facility] None  12/02/2019  8:10 AM [facility] DEVICE REMOTES [facility]  01/02/2020  2:40 PM Laurey Morale, MD [facility] None  03/02/2020  8:10 AM [facility] DEVICE REMOTES [facility]  06/01/2020  8:10 AM [facility] DEVICE REMOTES [facility]    BP 101/75 (BP Location: Left Arm, Patient Position: Sitting, Cuff Size: Normal)   Pulse 78   Resp 16   Wt 268 lb (121.6 kg)   SpO2 92%   BMI 31.78 kg/m   Weight yesterday- 260 lb  Last visit weight- 269 lb  Mr Goodell was seen at home today and reported feeling well. He denied chest pain, OB, headache, dizziness, orthopnea, fever or cough since our last visit. He had missed a few slots of medications over the past week but his weight remains stable from his clinic visit last week. His medications were verified and his pillbox was refilled I will follow up next week.   [name], EMT 10/08/19  ACTION: Home visit completed Next visit planned for 1 week

## 2019-10-09 LAB — MULTIPLE MYELOMA PANEL, SERUM
Albumin SerPl Elph-Mcnc: 4.3 g/dL (ref 2.9–4.4)
Albumin/Glob SerPl: 1 (ref 0.7–1.7)
Alpha 1: 0.2 g/dL (ref 0.0–0.4)
Alpha2 Glob SerPl Elph-Mcnc: 0.7 g/dL (ref 0.4–1.0)
B-Globulin SerPl Elph-Mcnc: 1.2 g/dL (ref 0.7–1.3)
Gamma Glob SerPl Elph-Mcnc: 2.3 g/dL — ABNORMAL HIGH (ref 0.4–1.8)
Globulin, Total: 4.5 g/dL — ABNORMAL HIGH (ref 2.2–3.9)
IgA: 480 mg/dL — ABNORMAL HIGH (ref 90–386)
IgG (Immunoglobin G), Serum: 2196 mg/dL — ABNORMAL HIGH (ref 603–1613)
IgM (Immunoglobulin M), Srm: 42 mg/dL (ref 20–172)
Total Protein ELP: 8.8 g/dL — ABNORMAL HIGH (ref 6.0–8.5)

## 2019-10-15 ENCOUNTER — Other Ambulatory Visit (HOSPITAL_COMMUNITY): Payer: Self-pay

## 2019-10-15 ENCOUNTER — Telehealth (HOSPITAL_COMMUNITY): Payer: Self-pay | Admitting: *Deleted

## 2019-10-15 MED ORDER — HYDRALAZINE HCL 25 MG PO TABS: 25.0000 mg | ORAL_TABLET | Freq: Three times a day (TID) | ORAL | 3 refills | Status: DC

## 2019-10-15 MED ORDER — JARDIANCE 25 MG PO TABS: 25.0000 mg | ORAL_TABLET | Freq: Every day | ORAL | 6 refills | Status: DC

## 2019-10-15 NOTE — Telephone Encounter (Signed)
Richard [name], paramedic is out to see pt and called to let us know that after pt started the Bisoprolol he developed increase SOB, he stop the med 4 days ago and states SOB has gone away since then, he will remain off medication, will let Dr Shirlee Latch know.  Pt needs refills of Hydralazine and Jardiance, rx's sent int

## 2019-10-15 NOTE — Progress Notes (Signed)
Paramedicine Encounter    Patient ID: Richard [name], male    DOB: [birth year], 59 y.o.   MRN: [id number]   Patient Care Team: Ananias Pilgrim, MD as PCP - General (Family Medicine) Laurey Morale, MD as PCP - Advanced Heart Failure (Cardiology) Burna Sis, LCSW as Social Worker (Licensed Clinical Social Worker)  Patient Active Problem List   Diagnosis Date Noted  . Acute on chronic combined systolic and diastolic CHF (congestive heart failure) (HCC) 03/15/2018  . S/P right and left heart catheterization 11/02/2017  . Panic attacks 11/02/2017  . Non-STEMI (non-ST elevated myocardial infarction) (HCC)   . COPD (chronic obstructive pulmonary disease) (HCC)   . Hypertension   . CKD (chronic kidney disease)   . 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HCC)   . ICD (implantable cardioverter-defibrillator) in place   . GERD (gastroesophageal reflux disease)   . Diabetes mellitus with diabetic neuropathy, with long-term current use of insulin (HCC)     Current Outpatient Medications:  .  acetaminophen (TYLENOL) 650 MG CR tablet, Take 650 mg by mouth every 8 (eight) hours as needed for pain., Disp: , Rfl:  .  albuterol (PROVENTIL) (2.5 MG/3ML) 0.083% nebulizer solution, Inhale into the lungs., Disp: , Rfl:  .  allopurinol (ZYLOPRIM) 100 MG tablet, Take 1 tablet (100 mg total) by mouth daily., Disp: 30 tablet, Rfl: 3 .  aspirin 81 MG chewable tablet, Chew 81 mg by mouth daily., Disp: , Rfl:  .  atorvastatin (LIPITOR) 40 MG tablet, TAKE 1 TABLET(40 MG) BY MOUTH DAILY AT 6 PM, Disp: 90 tablet, Rfl: 3 .  bisacodyl (DULCOLAX) 5 MG EC tablet, Take 5 mg by mouth daily as needed for moderate constipation., Disp: , Rfl:  .  BREO ELLIPTA 100-25 MCG/INH AEPB, Inhale 1 puff into the lungs daily., Disp: , Rfl: 5 .  digoxin (LANOXIN) 0.125 MG tablet, TAKE 1 TABLET(0.125 MG) BY MOUTH DAILY, Disp: 30 tablet, Rfl: 6 .  diphenhydrAMINE (BENADRYL) 25 MG tablet, Take 25 mg by mouth at bedtime as needed., Disp: , Rfl:  .   DULoxetine (CYMBALTA) 60 MG capsule, Take 60 mg by mouth daily., Disp: , Rfl: 2 .  eplerenone (INSPRA) 50 MG tablet, Take 1 tablet (50 mg total) by mouth daily., Disp: 30 tablet, Rfl: 5 .  hydrALAZINE (APRESOLINE) 25 MG tablet, Take 1 tablet (25 mg total) by mouth 3 (three) times daily., Disp: 270 tablet, Rfl: 3 .  isosorbide dinitrate (ISORDIL) 20 MG tablet, Take 1 tablet (20 mg total) by mouth 3 (three) times daily., Disp: 90 tablet, Rfl: 11 .  JARDIANCE 25 MG TABS tablet, Take 25 mg by mouth daily., Disp: , Rfl:  .  MAGNESIUM-OXIDE 400 (241.3 Mg) MG tablet, Take 1 tablet (400 mg total) by mouth daily., Disp: 30 tablet, Rfl: 11 .  omeprazole (PRILOSEC) 20 MG capsule, Take 20 mg by mouth daily., Disp: , Rfl: 1 .  pregabalin (LYRICA) 100 MG capsule, Take 1 capsule (100 mg total) by mouth 2 (two) times daily., Disp: 60 capsule, Rfl: 3 .  sacubitril-valsartan (ENTRESTO) 97-103 MG, Take 1 tablet by mouth 2 (two) times daily., Disp: 180 tablet, Rfl: 3 .  sennosides-docusate sodium (SENOKOT-S) 8.6-50 MG tablet, Take 1 tablet by mouth daily., Disp: , Rfl:  .  torsemide (DEMADEX) 20 MG tablet, Take 2 tablets (40 mg total) by mouth daily., Disp: 180 tablet, Rfl: 3 .  VASCEPA 1 g capsule, Take 2 capsules (2 g total) by mouth 2 (two) times daily., Disp: 120 capsule,  Rfl: 5 .  Vitamin D, Cholecalciferol, 1000 units CAPS, Take 1,000 Units by mouth daily., Disp: , Rfl: 11 .  bisoprolol (ZEBETA) 5 MG tablet, Take 0.5 tablets (2.5 mg total) by mouth daily. (Patient not taking: Reported on 10/15/2019), Disp: 15 tablet, Rfl: 5 .  Insulin Pen Needle (NOVOFINE) 30G X 8 MM MISC, Inject 10 each into the skin as needed., Disp: 100 each, Rfl: 0 .  insulin regular human CONCENTRATED (HUMULIN R U-500 KWIKPEN) 500 UNIT/ML kwikpen, USE AS DIRECTED PER SLIDING SCALE. MAX DAILY DOSE IS 400 UNITS., Disp: , Rfl:  .  ranitidine (ZANTAC) 150 MG tablet, Take 150 mg by mouth 2 (two) times daily., Disp: , Rfl:  Allergies  Allergen  Reactions  . Codeine   . Coconut Oil Itching      Social History   Socioeconomic History  . Marital status: Divorced    Spouse name: Not on file  . Number of children: Not on file  . Years of education: Not on file  . Highest education level: Not on file  Occupational History  . Occupation: disability    Comment: stopped working in 2000 d/t occupational exposures  Tobacco Use  . Smoking status: Former Smoker    Types: Cigarettes  . Smokeless tobacco: Never Used  . Tobacco comment: "smoked when I drank"  Substance and Sexual Activity  . 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 Drug use: Never  . Sexual activity: Not Currently  Other Topics Concern  . Not on file  Social History Narrative   Patient lives at home with wife and some of his 10 children. He self-administers his own medications.   Social Determinants of Health   Financial Resource Strain:   . Difficulty of Paying Living Expenses:   Food Insecurity:   . Worried About Charity fundraiser in the Last Year:   . Arboriculturist in the Last Year:   Transportation Needs:   . Film/video editor (Medical):   Marland Kitchen Lack of Transportation (Non-Medical):   Physical Activity:   . Days of Exercise per Week:   . Minutes of Exercise per Session:   Stress:   . Feeling of Stress :   Social Connections:   . Frequency of Communication with Friends and Family:   . Frequency of Social Gatherings with Friends and Family:   . Attends Religious Services:   . Active Member of Clubs or Organizations:   . Attends Archivist Meetings:   Marland Kitchen Marital Status:   Intimate Partner Violence:   . Fear of Current or Ex-Partner:   . Emotionally Abused:   Marland Kitchen Physically Abused:   . Sexually Abused:     Physical Exam Cardiovascular:     Rate and Rhythm: Normal rate and regular rhythm.     Pulses: Normal pulses.  Pulmonary:     Effort: Pulmonary effort is normal.      Breath sounds: Normal breath sounds.  Musculoskeletal:        General: Normal range of motion.     Right lower leg: No edema.     Left lower leg: No edema.  Skin:    General: Skin is warm and dry.     Capillary Refill: Capillary refill takes less than 2 seconds.  Neurological:     Mental Status: He is alert and oriented to person, place, and time.  Psychiatric:  Mood and Affect: Mood normal.         Future Appointments  Date Time Provider Department Center  10/24/2019 11:00 AM [facility] INJ 1 [facility]  10/24/2019 12:00 PM [facility] 1 [facility]  10/27/2019  2:00 PM [facility] PHARMACY [facility] None  12/02/2019  8:10 AM [facility] DEVICE REMOTES [facility]  01/02/2020  2:40 PM Laurey Morale, MD [facility] None  03/02/2020  8:10 AM [facility] DEVICE REMOTES [facility]  06/01/2020  8:10 AM [facility] DEVICE REMOTES [facility]    BP (!) 120/97 (BP Location: Left Arm, Patient Position: Sitting, Cuff Size: Normal)   Pulse 80   Resp 16   Wt 260 lb (117.9 kg)   SpO2 93%   BMI 30.83 kg/m   Weight yesterday- 260 lb Last visit weight- 268 lb  Mr Malia was seen at home today and reported feeling well. He denied chest pain, Headache, dizziness, orthopnea, fever or cough since our last visit but did complain of some SOB. He stated the SOB began about 2 days after starting bisoprolol and persisted until he stopped taking it about 2 days ago. He stated his breathing returned to normal two days ago. I contacted the HF clinic and made them aware. His medications were verified and his pillbox was refilled. He was found to be hypertensive today but was under a lot of stress due to a disturbance in the house. I will follow up next week.   [name], EMT 10/15/19  ACTION: Home visit completed Next visit planned for 1 week

## 2019-10-22 ENCOUNTER — Other Ambulatory Visit (HOSPITAL_COMMUNITY): Payer: Self-pay

## 2019-10-22 NOTE — Progress Notes (Signed)
Paramedicine Encounter    Patient ID: Richard [name], male    DOB: Jun 04, 1961, 59 y.o.   MRN: [id number]   Patient Care Team: Ananias Pilgrim, MD as PCP - General (Family Medicine) Laurey Morale, MD as PCP - Advanced Heart Failure (Cardiology) Burna Sis, LCSW as Social Worker (Licensed Clinical Social Worker)  Patient Active Problem List   Diagnosis Date Noted  . Acute on chronic combined systolic and diastolic CHF (congestive heart failure) (HCC) 03/15/2018  . S/P right and left heart catheterization 11/02/2017  . Panic attacks 11/02/2017  . Non-STEMI (non-ST elevated myocardial infarction) (HCC)   . COPD (chronic obstructive pulmonary disease) (HCC)   . Hypertension   . CKD (chronic kidney disease)   . 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HCC)   . ICD (implantable cardioverter-defibrillator) in place   . GERD (gastroesophageal reflux disease)   . Diabetes mellitus with diabetic neuropathy, with long-term current use of insulin (HCC)     Current Outpatient Medications:  .  acetaminophen (TYLENOL) 650 MG CR tablet, Take 650 mg by mouth every 8 (eight) hours as needed for pain., Disp: , Rfl:  .  albuterol (PROVENTIL) (2.5 MG/3ML) 0.083% nebulizer solution, Inhale into the lungs., Disp: , Rfl:  .  allopurinol (ZYLOPRIM) 100 MG tablet, Take 1 tablet (100 mg total) by mouth daily., Disp: 30 tablet, Rfl: 3 .  aspirin 81 MG chewable tablet, Chew 81 mg by mouth daily., Disp: , Rfl:  .  atorvastatin (LIPITOR) 40 MG tablet, TAKE 1 TABLET(40 MG) BY MOUTH DAILY AT 6 PM, Disp: 90 tablet, Rfl: 3 .  bisacodyl (DULCOLAX) 5 MG EC tablet, Take 5 mg by mouth daily as needed for moderate constipation., Disp: , Rfl:  .  BREO ELLIPTA 100-25 MCG/INH AEPB, Inhale 1 puff into the lungs daily., Disp: , Rfl: 5 .  digoxin (LANOXIN) 0.125 MG tablet, TAKE 1 TABLET(0.125 MG) BY MOUTH DAILY, Disp: 30 tablet, Rfl: 6 .  diphenhydrAMINE (BENADRYL) 25 MG tablet, Take 25 mg by mouth at bedtime as needed., Disp: , Rfl:  .   DULoxetine (CYMBALTA) 60 MG capsule, Take 60 mg by mouth daily., Disp: , Rfl: 2 .  eplerenone (INSPRA) 50 MG tablet, Take 1 tablet (50 mg total) by mouth daily., Disp: 30 tablet, Rfl: 5 .  hydrALAZINE (APRESOLINE) 25 MG tablet, Take 1 tablet (25 mg total) by mouth 3 (three) times daily., Disp: 270 tablet, Rfl: 3 .  Insulin Pen Needle (NOVOFINE) 30G X 8 MM MISC, Inject 10 each into the skin as needed., Disp: 100 each, Rfl: 0 .  insulin regular human CONCENTRATED (HUMULIN R U-500 KWIKPEN) 500 UNIT/ML kwikpen, USE AS DIRECTED PER SLIDING SCALE. MAX DAILY DOSE IS 400 UNITS., Disp: , Rfl:  .  isosorbide dinitrate (ISORDIL) 20 MG tablet, Take 1 tablet (20 mg total) by mouth 3 (three) times daily., Disp: 90 tablet, Rfl: 11 .  JARDIANCE 25 MG TABS tablet, Take 25 mg by mouth daily., Disp: 30 tablet, Rfl: 6 .  MAGNESIUM-OXIDE 400 (241.3 Mg) MG tablet, Take 1 tablet (400 mg total) by mouth daily., Disp: 30 tablet, Rfl: 11 .  omeprazole (PRILOSEC) 20 MG capsule, Take 20 mg by mouth daily., Disp: , Rfl: 1 .  pregabalin (LYRICA) 100 MG capsule, Take 1 capsule (100 mg total) by mouth 2 (two) times daily., Disp: 60 capsule, Rfl: 3 .  ranitidine (ZANTAC) 150 MG tablet, Take 150 mg by mouth 2 (two) times daily., Disp: , Rfl:  .  sacubitril-valsartan (ENTRESTO) 97-103 MG,  Take 1 tablet by mouth 2 (two) times daily., Disp: 180 tablet, Rfl: 3 .  sennosides-docusate sodium (SENOKOT-S) 8.6-50 MG tablet, Take 1 tablet by mouth daily., Disp: , Rfl:  .  torsemide (DEMADEX) 20 MG tablet, Take 2 tablets (40 mg total) by mouth daily., Disp: 180 tablet, Rfl: 3 .  VASCEPA 1 g capsule, Take 2 capsules (2 g total) by mouth 2 (two) times daily., Disp: 120 capsule, Rfl: 5 .  Vitamin D, Cholecalciferol, 1000 units CAPS, Take 1,000 Units by mouth daily., Disp: , Rfl: 11 Allergies  Allergen Reactions  . Codeine   . Coconut Oil Itching      Social History   Socioeconomic History  . Marital status: Divorced    Spouse name: Not  on file  . Number of children: Not on file  . Years of education: Not on file  . Highest education level: Not on file  Occupational History  . Occupation: disability    Comment: stopped working in 2000 d/t occupational exposures  Tobacco Use  . Smoking status: Former Smoker    Types: Cigarettes  . Smokeless tobacco: Never Used  . Tobacco comment: "smoked when I drank"  Substance and Sexual Activity  . 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 Drug use: Never  . Sexual activity: Not Currently  Other Topics Concern  . Not on file  Social History Narrative   Patient lives at home with wife and some of his 10 children. He self-administers his own medications.   Social Determinants of Health   Financial Resource Strain:   . Difficulty of Paying Living Expenses:   Food Insecurity:   . Worried About Charity fundraiser in the Last Year:   . Arboriculturist in the Last Year:   Transportation Needs:   . Film/video editor (Medical):   Marland Kitchen Lack of Transportation (Non-Medical):   Physical Activity:   . Days of Exercise per Week:   . Minutes of Exercise per Session:   Stress:   . Feeling of Stress :   Social Connections:   . Frequency of Communication with Friends and Family:   . Frequency of Social Gatherings with Friends and Family:   . Attends Religious Services:   . Active Member of Clubs or Organizations:   . Attends Archivist Meetings:   Marland Kitchen Marital Status:   Intimate Partner Violence:   . Fear of Current or Ex-Partner:   . Emotionally Abused:   Marland Kitchen Physically Abused:   . Sexually Abused:     Physical Exam Cardiovascular:     Rate and Rhythm: Normal rate and regular rhythm.     Pulses: Normal pulses.  Pulmonary:     Effort: Pulmonary effort is normal.     Breath sounds: Normal breath sounds.  Musculoskeletal:        General: Normal range of motion.     Right lower leg: Edema present.     Left lower  leg: Edema present.  Skin:    General: Skin is warm and dry.     Capillary Refill: Capillary refill takes less than 2 seconds.  Neurological:     Mental Status: He is alert and oriented to person, place, and time.  Psychiatric:        Mood and Affect: Mood normal.         Future Appointments  Date Time Provider [place]  10/24/2019 11:00  AM [facility] 1 [facility]  10/24/2019 12:00 PM [facility] 1 [facility]  10/27/2019  2:00 PM [facility] PHARMACY [facility] None  12/02/2019  8:10 AM [facility] DEVICE REMOTES [facility]  01/02/2020  2:40 PM Laurey Morale, MD [facility] None  03/02/2020  8:10 AM [facility] DEVICE REMOTES [facility]  06/01/2020  8:10 AM [facility] DEVICE REMOTES [facility]    BP 115/66 (BP Location: Left Arm, Patient Position: Sitting, Cuff Size: Normal)   Pulse 88   Resp 16   Wt 263 lb (119.3 kg)   SpO2 94%   BMI 31.19 kg/m   Weight yesterday- 264 lb Last visit weight- 260 lb  Mr Beigel was seen at home today and reported feeling well. He denied chest pain, SOB, headache, dizziness, orthopnea, fever or cough over the past week. He stated he had been compliant with his medications over the past week but had missed one evening slot since our last visit. He reported his weight has been generally stable but he has some minimal edema in his lower extremities. His medications were verified and his pillbox was refilled I will follow up next week.   [name], EMT 10/22/19  ACTION: Home visit completed Next visit planned for 1 week

## 2019-10-24 ENCOUNTER — Ambulatory Visit (HOSPITAL_COMMUNITY): Payer: Medicare Other | Attending: Cardiology

## 2019-10-24 ENCOUNTER — Encounter (HOSPITAL_COMMUNITY): Payer: Medicare Other

## 2019-10-27 ENCOUNTER — Inpatient Hospital Stay (HOSPITAL_COMMUNITY)
Admission: RE | Admit: 2019-10-27 | Discharge: 2019-10-27 | Disposition: A | Discharge status: Home / Self Care | Payer: Medicare Other | Source: Ambulatory Visit

## 2019-10-29 ENCOUNTER — Telehealth (HOSPITAL_COMMUNITY): Payer: Self-pay

## 2019-10-29 NOTE — Telephone Encounter (Signed)
I called Richard [name] to see if he was available for a visit. He stated he had just gotten home from a doctors office for neuropathy and was not feeling up for a visit at this time. He advised he would be available tomorrow so we agreed to meet in the morning.   [name], EMT 10/29/19

## 2019-10-30 ENCOUNTER — Other Ambulatory Visit (HOSPITAL_COMMUNITY): Payer: Self-pay

## 2019-10-30 NOTE — Progress Notes (Signed)
Paramedicine Encounter    Patient ID: Richard [name], male    DOB: 06-Sep-1960, 59 y.o.   MRN: [id number]   Patient Care Team: Ananias Pilgrim, MD as PCP - General (Family Medicine) Laurey Morale, MD as PCP - Advanced Heart Failure (Cardiology) Burna Sis, LCSW as Social Worker (Licensed Clinical Social Worker)  Patient Active Problem List   Diagnosis Date Noted  . Acute on chronic combined systolic and diastolic CHF (congestive heart failure) (HCC) 03/15/2018  . S/P right and left heart catheterization 11/02/2017  . Panic attacks 11/02/2017  . Non-STEMI (non-ST elevated myocardial infarction) (HCC)   . COPD (chronic obstructive pulmonary disease) (HCC)   . Hypertension   . CKD (chronic kidney disease)   . 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HCC)   . ICD (implantable cardioverter-defibrillator) in place   . GERD (gastroesophageal reflux disease)   . Diabetes mellitus with diabetic neuropathy, with long-term current use of insulin (HCC)     Current Outpatient Medications:  .  acetaminophen (TYLENOL) 650 MG CR tablet, Take 650 mg by mouth every 8 (eight) hours as needed for pain., Disp: , Rfl:  .  albuterol (PROVENTIL) (2.5 MG/3ML) 0.083% nebulizer solution, Inhale into the lungs., Disp: , Rfl:  .  allopurinol (ZYLOPRIM) 100 MG tablet, Take 1 tablet (100 mg total) by mouth daily., Disp: 30 tablet, Rfl: 3 .  aspirin 81 MG chewable tablet, Chew 81 mg by mouth daily., Disp: , Rfl:  .  atorvastatin (LIPITOR) 40 MG tablet, TAKE 1 TABLET(40 MG) BY MOUTH DAILY AT 6 PM, Disp: 90 tablet, Rfl: 3 .  bisacodyl (DULCOLAX) 5 MG EC tablet, Take 5 mg by mouth daily as needed for moderate constipation., Disp: , Rfl:  .  BREO ELLIPTA 100-25 MCG/INH AEPB, Inhale 1 puff into the lungs daily., Disp: , Rfl: 5 .  digoxin (LANOXIN) 0.125 MG tablet, TAKE 1 TABLET(0.125 MG) BY MOUTH DAILY, Disp: 30 tablet, Rfl: 6 .  diphenhydrAMINE (BENADRYL) 25 MG tablet, Take 25 mg by mouth at bedtime as needed., Disp: , Rfl:  .   DULoxetine (CYMBALTA) 60 MG capsule, Take 60 mg by mouth daily., Disp: , Rfl: 2 .  eplerenone (INSPRA) 50 MG tablet, Take 1 tablet (50 mg total) by mouth daily., Disp: 30 tablet, Rfl: 5 .  isosorbide dinitrate (ISORDIL) 20 MG tablet, Take 1 tablet (20 mg total) by mouth 3 (three) times daily., Disp: 90 tablet, Rfl: 11 .  JARDIANCE 25 MG TABS tablet, Take 25 mg by mouth daily., Disp: 30 tablet, Rfl: 6 .  MAGNESIUM-OXIDE 400 (241.3 Mg) MG tablet, Take 1 tablet (400 mg total) by mouth daily., Disp: 30 tablet, Rfl: 11 .  omeprazole (PRILOSEC) 20 MG capsule, Take 20 mg by mouth daily., Disp: , Rfl: 1 .  pregabalin (LYRICA) 100 MG capsule, Take 1 capsule (100 mg total) by mouth 2 (two) times daily., Disp: 60 capsule, Rfl: 3 .  sacubitril-valsartan (ENTRESTO) 97-103 MG, Take 1 tablet by mouth 2 (two) times daily., Disp: 180 tablet, Rfl: 3 .  sennosides-docusate sodium (SENOKOT-S) 8.6-50 MG tablet, Take 1 tablet by mouth daily., Disp: , Rfl:  .  torsemide (DEMADEX) 20 MG tablet, Take 2 tablets (40 mg total) by mouth daily., Disp: 180 tablet, Rfl: 3 .  VASCEPA 1 g capsule, Take 2 capsules (2 g total) by mouth 2 (two) times daily., Disp: 120 capsule, Rfl: 5 .  Vitamin D, Cholecalciferol, 1000 units CAPS, Take 1,000 Units by mouth daily., Disp: , Rfl: 11 .  hydrALAZINE (  APRESOLINE) 25 MG tablet, Take 1 tablet (25 mg total) by mouth 3 (three) times daily., Disp: 270 tablet, Rfl: 3 .  Insulin Pen Needle (NOVOFINE) 30G X 8 MM MISC, Inject 10 each into the skin as needed., Disp: 100 each, Rfl: 0 .  insulin regular human CONCENTRATED (HUMULIN R U-500 KWIKPEN) 500 UNIT/ML kwikpen, USE AS DIRECTED PER SLIDING SCALE. MAX DAILY DOSE IS 400 UNITS., Disp: , Rfl:  .  ranitidine (ZANTAC) 150 MG tablet, Take 150 mg by mouth 2 (two) times daily., Disp: , Rfl:  Allergies  Allergen Reactions  . Codeine   . Coconut Oil Itching      Social History   Socioeconomic History  . Marital status: Divorced    Spouse name: Not  on file  . Number of children: Not on file  . Years of education: Not on file  . Highest education level: Not on file  Occupational History  . Occupation: disability    Comment: stopped working in 2000 d/t occupational exposures  Tobacco Use  . Smoking status: Former Smoker    Types: Cigarettes  . Smokeless tobacco: Never Used  . Tobacco comment: "smoked when I drank"  Substance and Sexual Activity  . 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 Drug use: Never  . Sexual activity: Not Currently  Other Topics Concern  . Not on file  Social History Narrative   Patient lives at home with wife and some of his 10 children. He self-administers his own medications.   Social Determinants of Health   Financial Resource Strain:   . Difficulty of Paying Living Expenses:   Food Insecurity:   . Worried About Programme researcher, broadcasting/film/video in the Last Year:   . Barista in the Last Year:   Transportation Needs:   . Freight forwarder (Medical):   Marland Kitchen Lack of Transportation (Non-Medical):   Physical Activity:   . Days of Exercise per Week:   . Minutes of Exercise per Session:   Stress:   . Feeling of Stress :   Social Connections:   . Frequency of Communication with Friends and Family:   . Frequency of Social Gatherings with Friends and Family:   . Attends Religious Services:   . Active Member of Clubs or Organizations:   . Attends Banker Meetings:   Marland Kitchen Marital Status:   Intimate Partner Violence:   . Fear of Current or Ex-Partner:   . Emotionally Abused:   Marland Kitchen Physically Abused:   . Sexually Abused:     Physical Exam Cardiovascular:     Rate and Rhythm: Normal rate and regular rhythm.     Pulses: Normal pulses.  Pulmonary:     Effort: Pulmonary effort is normal.     Breath sounds: Normal breath sounds.  Musculoskeletal:        General: Normal range of motion.     Right lower leg: No edema.     Left lower leg:  No edema.  Skin:    General: Skin is warm and dry.     Capillary Refill: Capillary refill takes less than 2 seconds.  Neurological:     Mental Status: He is alert and oriented to person, place, and time.  Psychiatric:        Mood and Affect: Mood normal.         Future Appointments  Date Time Provider Department Center  12/02/2019  8:10 AM [facility]  01/02/2020  2:40 PM Larey Dresser, MD [facility] None  03/02/2020  8:10 AM [facility]  06/01/2020  8:10 AM [facility]    BP (!) 93/56 (BP Location: Left Arm, Patient Position: Sitting, Cuff Size: Normal)   Pulse 80   Resp 16   Wt 261 lb (118.4 kg)   SpO2 95%   BMI 30.95 kg/m   Weight yesterday- 265 lb Last visit weight- 263 lb  Mr Squillace was seen at home today and reported feeling well. He denied chest pain, SOB, headache, dizziness, orthopnea, fever or cough since our last visit. He stated he has been compliant with his medications but his weight has been trending up. His medications were verified and his pillbox was refilled. I will follow up next week.   Jacquiline Doe, EMT 10/30/19  ACTION: Home visit completed Next visit planned for 1 week

## 2019-11-03 ENCOUNTER — Other Ambulatory Visit: Payer: Self-pay | Admitting: Internal Medicine

## 2019-11-05 ENCOUNTER — Telehealth (HOSPITAL_COMMUNITY): Payer: Self-pay

## 2019-11-05 NOTE — Telephone Encounter (Signed)
I called Richard [name] to see if he was available for a home visit. He did not answer so I left a message requesting he call me back.   [name], EMT 11/05/19

## 2019-11-05 NOTE — Telephone Encounter (Signed)
I called Mr Cybulski again to see if he was available. He did not answer so I left another voicemail. I will follow up again tomorrow.   [name], EMT 11/05/19

## 2019-11-06 ENCOUNTER — Other Ambulatory Visit (HOSPITAL_COMMUNITY): Payer: Self-pay

## 2019-11-06 NOTE — Progress Notes (Signed)
Paramedicine Encounter    Patient ID: Richard [name], male    DOB: [birth year], 59 y.o.   MRN: [id number]   Patient Care Team: Ananias Pilgrim, MD as PCP - General (Family Medicine) Laurey Morale, MD as PCP - Advanced Heart Failure (Cardiology) Burna Sis, LCSW as Social Worker (Licensed Clinical Social Worker)  Patient Active Problem List   Diagnosis Date Noted  . Acute on chronic combined systolic and diastolic CHF (congestive heart failure) (HCC) 03/15/2018  . S/P right and left heart catheterization 11/02/2017  . Panic attacks 11/02/2017  . Non-STEMI (non-ST elevated myocardial infarction) (HCC)   . COPD (chronic obstructive pulmonary disease) (HCC)   . Hypertension   . CKD (chronic kidney disease)   . 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HCC)   . ICD (implantable cardioverter-defibrillator) in place   . GERD (gastroesophageal reflux disease)   . Diabetes mellitus with diabetic neuropathy, with long-term current use of insulin (HCC)     Current Outpatient Medications:  .  acetaminophen (TYLENOL) 650 MG CR tablet, Take 650 mg by mouth every 8 (eight) hours as needed for pain., Disp: , Rfl:  .  albuterol (PROVENTIL) (2.5 MG/3ML) 0.083% nebulizer solution, Inhale into the lungs., Disp: , Rfl:  .  allopurinol (ZYLOPRIM) 100 MG tablet, Take 1 tablet (100 mg total) by mouth daily., Disp: 30 tablet, Rfl: 3 .  aspirin 81 MG chewable tablet, Chew 81 mg by mouth daily., Disp: , Rfl:  .  atorvastatin (LIPITOR) 40 MG tablet, TAKE 1 TABLET(40 MG) BY MOUTH DAILY AT 6 PM, Disp: 90 tablet, Rfl: 3 .  bisacodyl (DULCOLAX) 5 MG EC tablet, Take 5 mg by mouth daily as needed for moderate constipation., Disp: , Rfl:  .  BREO ELLIPTA 100-25 MCG/INH AEPB, Inhale 1 puff into the lungs daily., Disp: , Rfl: 5 .  digoxin (LANOXIN) 0.125 MG tablet, TAKE 1 TABLET(0.125 MG) BY MOUTH DAILY, Disp: 30 tablet, Rfl: 6 .  diphenhydrAMINE (BENADRYL) 25 MG tablet, Take 25 mg by mouth at bedtime as needed., Disp: , Rfl:  .   DULoxetine (CYMBALTA) 60 MG capsule, Take 60 mg by mouth daily., Disp: , Rfl: 2 .  eplerenone (INSPRA) 50 MG tablet, Take 1 tablet (50 mg total) by mouth daily., Disp: 30 tablet, Rfl: 5 .  hydrALAZINE (APRESOLINE) 25 MG tablet, Take 1 tablet (25 mg total) by mouth 3 (three) times daily., Disp: 270 tablet, Rfl: 3 .  isosorbide dinitrate (ISORDIL) 20 MG tablet, Take 1 tablet (20 mg total) by mouth 3 (three) times daily., Disp: 90 tablet, Rfl: 11 .  JARDIANCE 25 MG TABS tablet, Take 25 mg by mouth daily., Disp: 30 tablet, Rfl: 6 .  MAGNESIUM-OXIDE 400 (241.3 Mg) MG tablet, Take 1 tablet (400 mg total) by mouth daily., Disp: 30 tablet, Rfl: 11 .  omeprazole (PRILOSEC) 20 MG capsule, Take 20 mg by mouth daily., Disp: , Rfl: 1 .  pregabalin (LYRICA) 100 MG capsule, Take 1 capsule (100 mg total) by mouth 2 (two) times daily., Disp: 60 capsule, Rfl: 3 .  sacubitril-valsartan (ENTRESTO) 97-103 MG, Take 1 tablet by mouth 2 (two) times daily., Disp: 180 tablet, Rfl: 3 .  sennosides-docusate sodium (SENOKOT-S) 8.6-50 MG tablet, Take 1 tablet by mouth daily., Disp: , Rfl:  .  torsemide (DEMADEX) 20 MG tablet, Take 2 tablets (40 mg total) by mouth daily., Disp: 180 tablet, Rfl: 3 .  VASCEPA 1 g capsule, Take 2 capsules (2 g total) by mouth 2 (two) times daily., Disp: 120  capsule, Rfl: 5 .  Vitamin D, Cholecalciferol, 1000 units CAPS, Take 1,000 Units by mouth daily., Disp: , Rfl: 11 .  Insulin Pen Needle (NOVOFINE) 30G X 8 MM MISC, Inject 10 each into the skin as needed., Disp: 100 each, Rfl: 0 .  insulin regular human CONCENTRATED (HUMULIN R U-500 KWIKPEN) 500 UNIT/ML kwikpen, USE AS DIRECTED PER SLIDING SCALE. MAX DAILY DOSE IS 400 UNITS., Disp: , Rfl:  .  ranitidine (ZANTAC) 150 MG tablet, Take 150 mg by mouth 2 (two) times daily., Disp: , Rfl:  Allergies  Allergen Reactions  . Codeine   . Coconut Oil Itching      Social History   Socioeconomic History  . Marital status: Divorced    Spouse name: Not  on file  . Number of children: Not on file  . Years of education: Not on file  . Highest education level: Not on file  Occupational History  . Occupation: disability    Comment: stopped working in 2000 d/t occupational exposures  Tobacco Use  . Smoking status: Former Smoker    Types: Cigarettes  . Smokeless tobacco: Never Used  . Tobacco comment: "smoked when I drank"  Substance and Sexual Activity  . 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 Drug use: Never  . Sexual activity: Not Currently  Other Topics Concern  . Not on file  Social History Narrative   Patient lives at home with wife and some of his 10 children. He self-administers his own medications.   Social Determinants of Health   Financial Resource Strain:   . Difficulty of Paying Living Expenses:   Food Insecurity:   . Worried About Charity fundraiser in the Last Year:   . Arboriculturist in the Last Year:   Transportation Needs:   . Film/video editor (Medical):   Marland Kitchen Lack of Transportation (Non-Medical):   Physical Activity:   . Days of Exercise per Week:   . Minutes of Exercise per Session:   Stress:   . Feeling of Stress :   Social Connections:   . Frequency of Communication with Friends and Family:   . Frequency of Social Gatherings with Friends and Family:   . Attends Religious Services:   . Active Member of Clubs or Organizations:   . Attends Archivist Meetings:   Marland Kitchen Marital Status:   Intimate Partner Violence:   . Fear of Current or Ex-Partner:   . Emotionally Abused:   Marland Kitchen Physically Abused:   . Sexually Abused:     Physical Exam Cardiovascular:     Rate and Rhythm: Normal rate and regular rhythm.     Pulses: Normal pulses.  Pulmonary:     Effort: Pulmonary effort is normal.     Breath sounds: Normal breath sounds.  Abdominal:     General: There is distension.  Musculoskeletal:        General: Normal range of motion.      Right lower leg: Edema present.     Left lower leg: Edema present.  Skin:    General: Skin is warm and dry.     Capillary Refill: Capillary refill takes less than 2 seconds.  Neurological:     Mental Status: He is alert and oriented to person, place, and time.  Psychiatric:        Mood and Affect: Mood normal.         Future  Appointments  Date Time Provider Department Center  12/02/2019  8:10 AM [facility]  01/02/2020  2:40 PM Laurey Morale, MD [facility] None  03/02/2020  8:10 AM [facility]  06/01/2020  8:10 AM [facility]    BP 139/83 (BP Location: Left Arm, Patient Position: Sitting, Cuff Size: Normal)   Pulse 70   Resp 16   Wt 266 lb (120.7 kg)   SpO2 93%   BMI 31.54 kg/m   Weight yesterday- 265 lb Last visit weight- 261 lb  Mr Panebianco was seen at home today and reported feeling generally well. He denied chest pain, SOB, headache, dizziness, orthopnea, fever or cough over the past week. He complained of trouble moving his bowels and increased lower extremity edema but insisted he takes his medicine. Upon looking at his pillbox there were two days of medications which had not been taken over the past week. Additionally he showed me a Tupperware container of pills that he had dropped and therefore not taken. In this container there were several days worth of torsemide, Vascepa, isosorbide, hydralazine, Jardiance and Lyrica. I explained which of these medications were to help his heart failure by limiting fluid retention and that this is likely the reason for his edema and weight gain. I also explained that in addition to taking his medications as prescribed, he needs to cut back on his fluid intake and stick to less that 2 liters per day. He rebutted that he has not drank anything all day and when I pointed out the 32 ounce cup with soda and ice sitting beside him, he  stated it has been there all day and he has not been drinking out of it. Clearly medication and dietary compliance remain significant issues for Mr Eudy but we will continue working to resolve these problems. His medications were verified and his pillbox was refilled. I will follow up next week.   [name], EMT 11/06/19  ACTION: Home visit completed Next visit planned for 1 week

## 2019-11-12 ENCOUNTER — Telehealth (HOSPITAL_COMMUNITY): Payer: Self-pay

## 2019-11-12 NOTE — Telephone Encounter (Signed)
I called Mr Richard [name] to schedule an appointment. He did not answer so I left a message requesting he call me back.   [name], EMT 11/12/19

## 2019-11-12 NOTE — Telephone Encounter (Signed)
I called Richard [name] again to see if he was available. His wife answered the phone and advised that he was taking a nap and did not want a visit today. I asked if I could come tomorrow and she stated he would be available around 15:00. I will see him then.   [name], EMT 11/12/19

## 2019-11-13 ENCOUNTER — Other Ambulatory Visit (HOSPITAL_COMMUNITY): Payer: Self-pay

## 2019-11-13 NOTE — Progress Notes (Signed)
Paramedicine Encounter    Patient ID: Richard [name], male    DOB: March 01, 1961, 59 y.o.   MRN: [id number]   Patient Care Team: Wallene Dales, MD as PCP - General (Family Medicine) Larey Dresser, MD as PCP - Advanced Heart Failure (Cardiology) Jorge Ny, LCSW as Social Worker (Licensed Clinical Social Worker)  Patient Active Problem List   Diagnosis Date Noted  . Acute on chronic combined systolic and diastolic CHF (congestive heart failure) ([place]) 03/15/2018  . S/P right and left heart catheterization 11/02/2017  . Panic attacks 11/02/2017  . Non-STEMI (non-ST elevated myocardial infarction) ([place])   . COPD (chronic obstructive pulmonary disease) ([place])   . Hypertension   . CKD (chronic kidney disease)   . 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[place])   . ICD (implantable cardioverter-defibrillator) in place   . GERD (gastroesophageal reflux disease)   . Diabetes mellitus with diabetic neuropathy, with long-term current use of insulin (HCC)     Current Outpatient Medications:  .  acetaminophen (TYLENOL) 650 MG CR tablet, Take 650 mg by mouth every 8 (eight) hours as needed for pain., Disp: , Rfl:  .  albuterol (PROVENTIL) (2.5 MG/3ML) 0.083% nebulizer solution, Inhale into the lungs., Disp: , Rfl:  .  allopurinol (ZYLOPRIM) 100 MG tablet, Take 1 tablet (100 mg total) by mouth daily., Disp: 30 tablet, Rfl: 3 .  aspirin 81 MG chewable tablet, Chew 81 mg by mouth daily., Disp: , Rfl:  .  atorvastatin (LIPITOR) 40 MG tablet, TAKE 1 TABLET(40 MG) BY MOUTH DAILY AT 6 PM, Disp: 90 tablet, Rfl: 3 .  bisacodyl (DULCOLAX) 5 MG EC tablet, Take 5 mg by mouth daily as needed for moderate constipation., Disp: , Rfl:  .  BREO ELLIPTA 100-25 MCG/INH AEPB, Inhale 1 puff into the lungs daily., Disp: , Rfl: 5 .  digoxin (LANOXIN) 0.125 MG tablet, TAKE 1 TABLET(0.125 MG) BY MOUTH DAILY, Disp: 30 tablet, Rfl: 6 .  diphenhydrAMINE (BENADRYL) 25 MG tablet, Take 25 mg by mouth at bedtime as needed., Disp: , Rfl:  .   DULoxetine (CYMBALTA) 60 MG capsule, Take 60 mg by mouth daily., Disp: , Rfl: 2 .  eplerenone (INSPRA) 50 MG tablet, Take 1 tablet (50 mg total) by mouth daily., Disp: 30 tablet, Rfl: 5 .  hydrALAZINE (APRESOLINE) 25 MG tablet, Take 1 tablet (25 mg total) by mouth 3 (three) times daily., Disp: 270 tablet, Rfl: 3 .  isosorbide dinitrate (ISORDIL) 20 MG tablet, Take 1 tablet (20 mg total) by mouth 3 (three) times daily., Disp: 90 tablet, Rfl: 11 .  JARDIANCE 25 MG TABS tablet, Take 25 mg by mouth daily., Disp: 30 tablet, Rfl: 6 .  MAGNESIUM-OXIDE 400 (241.3 Mg) MG tablet, Take 1 tablet (400 mg total) by mouth daily., Disp: 30 tablet, Rfl: 11 .  omeprazole (PRILOSEC) 20 MG capsule, Take 20 mg by mouth daily., Disp: , Rfl: 1 .  sacubitril-valsartan (ENTRESTO) 97-103 MG, Take 1 tablet by mouth 2 (two) times daily., Disp: 180 tablet, Rfl: 3 .  sennosides-docusate sodium (SENOKOT-S) 8.6-50 MG tablet, Take 1 tablet by mouth daily., Disp: , Rfl:  .  torsemide (DEMADEX) 20 MG tablet, Take 2 tablets (40 mg total) by mouth daily., Disp: 180 tablet, Rfl: 3 .  VASCEPA 1 g capsule, Take 2 capsules (2 g total) by mouth 2 (two) times daily., Disp: 120 capsule, Rfl: 5 .  Vitamin D, Cholecalciferol, 1000 units CAPS, Take 1,000 Units by mouth daily., Disp: , Rfl: 11 .  Insulin  Pen Needle (NOVOFINE) 30G X 8 MM MISC, Inject 10 each into the skin as needed., Disp: 100 each, Rfl: 0 .  insulin regular human CONCENTRATED (HUMULIN R U-500 KWIKPEN) 500 UNIT/ML kwikpen, USE AS DIRECTED PER SLIDING SCALE. MAX DAILY DOSE IS 400 UNITS., Disp: , Rfl:  .  pregabalin (LYRICA) 100 MG capsule, Take 1 capsule (100 mg total) by mouth 2 (two) times daily. (Patient not taking: Reported on 11/13/2019), Disp: 60 capsule, Rfl: 3 .  ranitidine (ZANTAC) 150 MG tablet, Take 150 mg by mouth 2 (two) times daily., Disp: , Rfl:  Allergies  Allergen Reactions  . Codeine   . Coconut Oil Itching      Social History   Socioeconomic History  .  Marital status: Divorced    Spouse name: Not on file  . Number of children: Not on file  . Years of education: Not on file  . Highest education level: Not on file  Occupational History  . Occupation: disability    Comment: stopped working in 2000 d/t occupational exposures  Tobacco Use  . Smoking status: Former Smoker    Types: Cigarettes  . Smokeless tobacco: Never Used  . Tobacco comment: "smoked when I drank"  Substance and Sexual Activity  . 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 Drug use: Never  . Sexual activity: Not Currently  Other Topics Concern  . Not on file  Social History Narrative   Patient lives at home with wife and some of his 10 children. He self-administers his own medications.   Social Determinants of Health   Financial Resource Strain:   . Difficulty of Paying Living Expenses:   Food Insecurity:   . Worried About Programme researcher, broadcasting/film/video in the Last Year:   . Barista in the Last Year:   Transportation Needs:   . Freight forwarder (Medical):   Marland Kitchen Lack of Transportation (Non-Medical):   Physical Activity:   . Days of Exercise per Week:   . Minutes of Exercise per Session:   Stress:   . Feeling of Stress :   Social Connections:   . Frequency of Communication with Friends and Family:   . Frequency of Social Gatherings with Friends and Family:   . Attends Religious Services:   . Active Member of Clubs or Organizations:   . Attends Banker Meetings:   Marland Kitchen Marital Status:   Intimate Partner Violence:   . Fear of Current or Ex-Partner:   . Emotionally Abused:   Marland Kitchen Physically Abused:   . Sexually Abused:     Physical Exam Cardiovascular:     Rate and Rhythm: Normal rate and regular rhythm.     Pulses: Normal pulses.  Pulmonary:     Effort: Pulmonary effort is normal.     Breath sounds: Normal breath sounds.  Musculoskeletal:        General: Normal range of motion.      Right lower leg: Edema present.     Left lower leg: Edema present.  Skin:    General: Skin is warm and dry.     Capillary Refill: Capillary refill takes less than 2 seconds.  Neurological:     Mental Status: He is alert and oriented to person, place, and time.  Psychiatric:        Mood and Affect: Mood normal.         Future Appointments  Date Time  Provider Department Center  12/02/2019  8:10 AM [facility]  01/02/2020  2:40 PM Laurey Morale, MD [facility] None  03/02/2020  8:10 AM [facility]  06/01/2020  8:10 AM [facility]    BP 119/79 (BP Location: Left Arm, Patient Position: Sitting, Cuff Size: Normal)   Pulse 70   Resp 16   Wt 263 lb (119.3 kg)   SpO2 90%   BMI 31.19 kg/m   Weight yesterday- did not weigh Last visit weight- 263 lb  Mr Biehn was seen at home today and reported feeling well with the exception of hand pain secondary to neuropathy. He denied chest pain, SOB, headache, dizziness, orthopnea, fever or cough since our last visit. He reported being compliant with his medications however he had missed at least one evening slot. Additionally he did not pick up Vascepa or Lyrica last week despite knowing it was needed this week. Compliance with medications remains a key concern with Mr Nettleton despite repeated conversations about it. I contacted the pharmacy to ensure the medications were ready and they confirmed they are ready for pick up. I ordered a few other medications which will be ready by 18:00. I advised Mr Hands to get this medication tonight and call me to let me know he has it so I can come by tomorrow and finish his pillbox. He was understanding and agreeable.  [name], EMT 11/13/19  ACTION: Home visit completed Next visit planned for tomorrow

## 2019-11-14 ENCOUNTER — Telehealth (HOSPITAL_COMMUNITY): Payer: Self-pay

## 2019-11-14 NOTE — Telephone Encounter (Signed)
I called Richard Richard [name] to see if he had picked up his medications as he was supposed to do last night. His wife answered and advised that he had not gone to the pharmacy last night or today. I advised that he was out of Vascepa and Lyrica. Richard [name] stated they pharmacy "did not have anything for him" however I confirmed with the pharmacy that the medications have been ready since last week. I explained that Richard [name] needs to take responsibility for getting his medications or else he would not be able to stay on paramedicine. I will follow up next week.   [name], EMT 11/14/19

## 2019-11-20 ENCOUNTER — Other Ambulatory Visit (HOSPITAL_COMMUNITY): Payer: Self-pay

## 2019-11-20 NOTE — Progress Notes (Signed)
Paramedicine Encounter    Patient ID: Richard [name], male    DOB: [birth year], 59 y.o.   MRN: [id number]   Patient Care Team: Wallene Dales, MD as PCP - General (Family Medicine) Larey Dresser, MD as PCP - Advanced Heart Failure (Cardiology) Jorge Ny, LCSW as Social Worker (Licensed Clinical Social Worker)  Patient Active Problem List   Diagnosis Date Noted  . Acute on chronic combined systolic and diastolic CHF (congestive heart failure) ([place]) 03/15/2018  . S/P right and left heart catheterization 11/02/2017  . Panic attacks 11/02/2017  . Non-STEMI (non-ST elevated myocardial infarction) ([place])   . COPD (chronic obstructive pulmonary disease) ([place])   . Hypertension   . CKD (chronic kidney disease)   . 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[place])   . ICD (implantable cardioverter-defibrillator) in place   . GERD (gastroesophageal reflux disease)   . Diabetes mellitus with diabetic neuropathy, with long-term current use of insulin (HCC)     Current Outpatient Medications:  .  acetaminophen (TYLENOL) 650 MG CR tablet, Take 650 mg by mouth every 8 (eight) hours as needed for pain., Disp: , Rfl:  .  albuterol (PROVENTIL) (2.5 MG/3ML) 0.083% nebulizer solution, Inhale into the lungs., Disp: , Rfl:  .  allopurinol (ZYLOPRIM) 100 MG tablet, Take 1 tablet (100 mg total) by mouth daily., Disp: 30 tablet, Rfl: 3 .  aspirin 81 MG chewable tablet, Chew 81 mg by mouth daily., Disp: , Rfl:  .  atorvastatin (LIPITOR) 40 MG tablet, TAKE 1 TABLET(40 MG) BY MOUTH DAILY AT 6 PM, Disp: 90 tablet, Rfl: 3 .  bisacodyl (DULCOLAX) 5 MG EC tablet, Take 5 mg by mouth daily as needed for moderate constipation., Disp: , Rfl:  .  digoxin (LANOXIN) 0.125 MG tablet, TAKE 1 TABLET(0.125 MG) BY MOUTH DAILY, Disp: 30 tablet, Rfl: 6 .  diphenhydrAMINE (BENADRYL) 25 MG tablet, Take 25 mg by mouth at bedtime as needed., Disp: , Rfl:  .  DULoxetine (CYMBALTA) 60 MG capsule, Take 60 mg by mouth daily., Disp: , Rfl: 2 .   eplerenone (INSPRA) 50 MG tablet, Take 1 tablet (50 mg total) by mouth daily., Disp: 30 tablet, Rfl: 5 .  hydrALAZINE (APRESOLINE) 25 MG tablet, Take 1 tablet (25 mg total) by mouth 3 (three) times daily., Disp: 270 tablet, Rfl: 3 .  isosorbide dinitrate (ISORDIL) 20 MG tablet, Take 1 tablet (20 mg total) by mouth 3 (three) times daily., Disp: 90 tablet, Rfl: 11 .  JARDIANCE 25 MG TABS tablet, Take 25 mg by mouth daily., Disp: 30 tablet, Rfl: 6 .  MAGNESIUM-OXIDE 400 (241.3 Mg) MG tablet, Take 1 tablet (400 mg total) by mouth daily., Disp: 30 tablet, Rfl: 11 .  pregabalin (LYRICA) 100 MG capsule, Take 1 capsule (100 mg total) by mouth 2 (two) times daily., Disp: 60 capsule, Rfl: 3 .  sacubitril-valsartan (ENTRESTO) 97-103 MG, Take 1 tablet by mouth 2 (two) times daily., Disp: 180 tablet, Rfl: 3 .  sennosides-docusate sodium (SENOKOT-S) 8.6-50 MG tablet, Take 1 tablet by mouth daily., Disp: , Rfl:  .  torsemide (DEMADEX) 20 MG tablet, Take 2 tablets (40 mg total) by mouth daily., Disp: 180 tablet, Rfl: 3 .  VASCEPA 1 g capsule, Take 2 capsules (2 g total) by mouth 2 (two) times daily., Disp: 120 capsule, Rfl: 5 .  Vitamin D, Cholecalciferol, 1000 units CAPS, Take 1,000 Units by mouth daily., Disp: , Rfl: 11 .  BREO ELLIPTA 100-25 MCG/INH AEPB, Inhale 1 puff into the lungs daily.,  Disp: , Rfl: 5 .  Insulin Pen Needle (NOVOFINE) 30G X 8 MM MISC, Inject 10 each into the skin as needed., Disp: 100 each, Rfl: 0 .  insulin regular human CONCENTRATED (HUMULIN R U-500 KWIKPEN) 500 UNIT/ML kwikpen, USE AS DIRECTED PER SLIDING SCALE. MAX DAILY DOSE IS 400 UNITS., Disp: , Rfl:  .  omeprazole (PRILOSEC) 20 MG capsule, Take 20 mg by mouth daily., Disp: , Rfl: 1 .  ranitidine (ZANTAC) 150 MG tablet, Take 150 mg by mouth 2 (two) times daily., Disp: , Rfl:  Allergies  Allergen Reactions  . Codeine   . Coconut Oil Itching      Social History   Socioeconomic History  . Marital status: Divorced    Spouse  name: Not on file  . Number of children: Not on file  . Years of education: Not on file  . Highest education level: Not on file  Occupational History  . Occupation: disability    Comment: stopped working in 2000 d/t occupational exposures  Tobacco Use  . Smoking status: Former Smoker    Types: Cigarettes  . Smokeless tobacco: Never Used  . Tobacco comment: "smoked when I drank"  Substance and Sexual Activity  . 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 Drug use: Never  . Sexual activity: Not Currently  Other Topics Concern  . Not on file  Social History Narrative   Patient lives at home with wife and some of his 10 children. He self-administers his own medications.   Social Determinants of Health   Financial Resource Strain:   . Difficulty of Paying Living Expenses:   Food Insecurity:   . Worried About Programme researcher, broadcasting/film/video in the Last Year:   . Barista in the Last Year:   Transportation Needs:   . Freight forwarder (Medical):   Marland Kitchen Lack of Transportation (Non-Medical):   Physical Activity:   . Days of Exercise per Week:   . Minutes of Exercise per Session:   Stress:   . Feeling of Stress :   Social Connections:   . Frequency of Communication with Friends and Family:   . Frequency of Social Gatherings with Friends and Family:   . Attends Religious Services:   . Active Member of Clubs or Organizations:   . Attends Banker Meetings:   Marland Kitchen Marital Status:   Intimate Partner Violence:   . Fear of Current or Ex-Partner:   . Emotionally Abused:   Marland Kitchen Physically Abused:   . Sexually Abused:     Physical Exam Cardiovascular:     Rate and Rhythm: Normal rate and regular rhythm.     Pulses: Normal pulses.  Pulmonary:     Effort: Pulmonary effort is normal.     Breath sounds: Normal breath sounds.  Musculoskeletal:        General: Normal range of motion.     Right lower leg: Edema present.      Left lower leg: Edema present.  Skin:    General: Skin is warm and dry.     Capillary Refill: Capillary refill takes less than 2 seconds.  Neurological:     Mental Status: He is alert and oriented to person, place, and time.  Psychiatric:        Mood and Affect: Mood normal.         Future Appointments  Date Time Provider Department Center  12/02/2019  8:10 AM [facility]  01/02/2020  2:40 PM Laurey Morale, MD [facility] None  03/02/2020  8:10 AM [facility]  06/01/2020  8:10 AM [facility]    BP 116/73 (BP Location: Left Arm, Patient Position: Sitting, Cuff Size: Normal)   Pulse 60   Resp 16   Wt 259 lb (117.5 kg)   SpO2 92%   BMI 30.71 kg/m   Weight yesterday- 258 lb Last visit weight- 263 lb  Mr Dada was seen at home today and reported feeling well. He denied chest pain, SOB, headache, dizziness, orthopnea, fever or cough since our last visit. He stated he has been compliant with his medications since picking them up from the pharmacy and his weight has been stable. He advised he has been taking a new OTC pills for diabetes and said it has controlled his sugar "better than insulin so [he] doesn't really use insuline anymore." I warned him against replacing prescription medications with OTC's without consulting his doctor and he stated he would tell his doctor at the next appointment but this pill was making his feel much better. The pill was in a blue bottle with the words "Sugar Control" on the label. The ingredients suggest it is a plant based supplement but I will consult with the HF clinic pharmacist to ensure this is safe for use with his HF medications. His medications were verified and his pillbox was refilled. I will follow up next week.   [name], EMT 11/20/19  ACTION: Home visit completed Next visit planned for 1 week

## 2019-11-27 ENCOUNTER — Telehealth (HOSPITAL_COMMUNITY): Payer: Self-pay

## 2019-11-27 NOTE — Telephone Encounter (Signed)
I called Mr Aird to see if he was available for an appointment. He did not answer so I left a message requesting he call me back.   [name], EMT 11/27/19

## 2019-11-28 ENCOUNTER — Telehealth (HOSPITAL_COMMUNITY): Payer: Self-pay

## 2019-11-28 NOTE — Telephone Encounter (Signed)
I called Mr Richard [name] to schedule an appointment. He did not answer so I left a message requesting he call me back. This is the second day in a row that I have been unable to reach him and he has not returned my call.  [name], EMT 11/28/19

## 2019-11-30 IMAGING — DX DG CHEST 1V PORT
1 series · 1 of 1 positions shown · non-contrast
Comparison: 09/06/2017, 01/12/2017 and earlier.

CLINICAL DATA: Acute onset of shortness of breath. Current history
of COPD and CHF.

EXAM:
PORTABLE CHEST 1 VIEW

[chest]
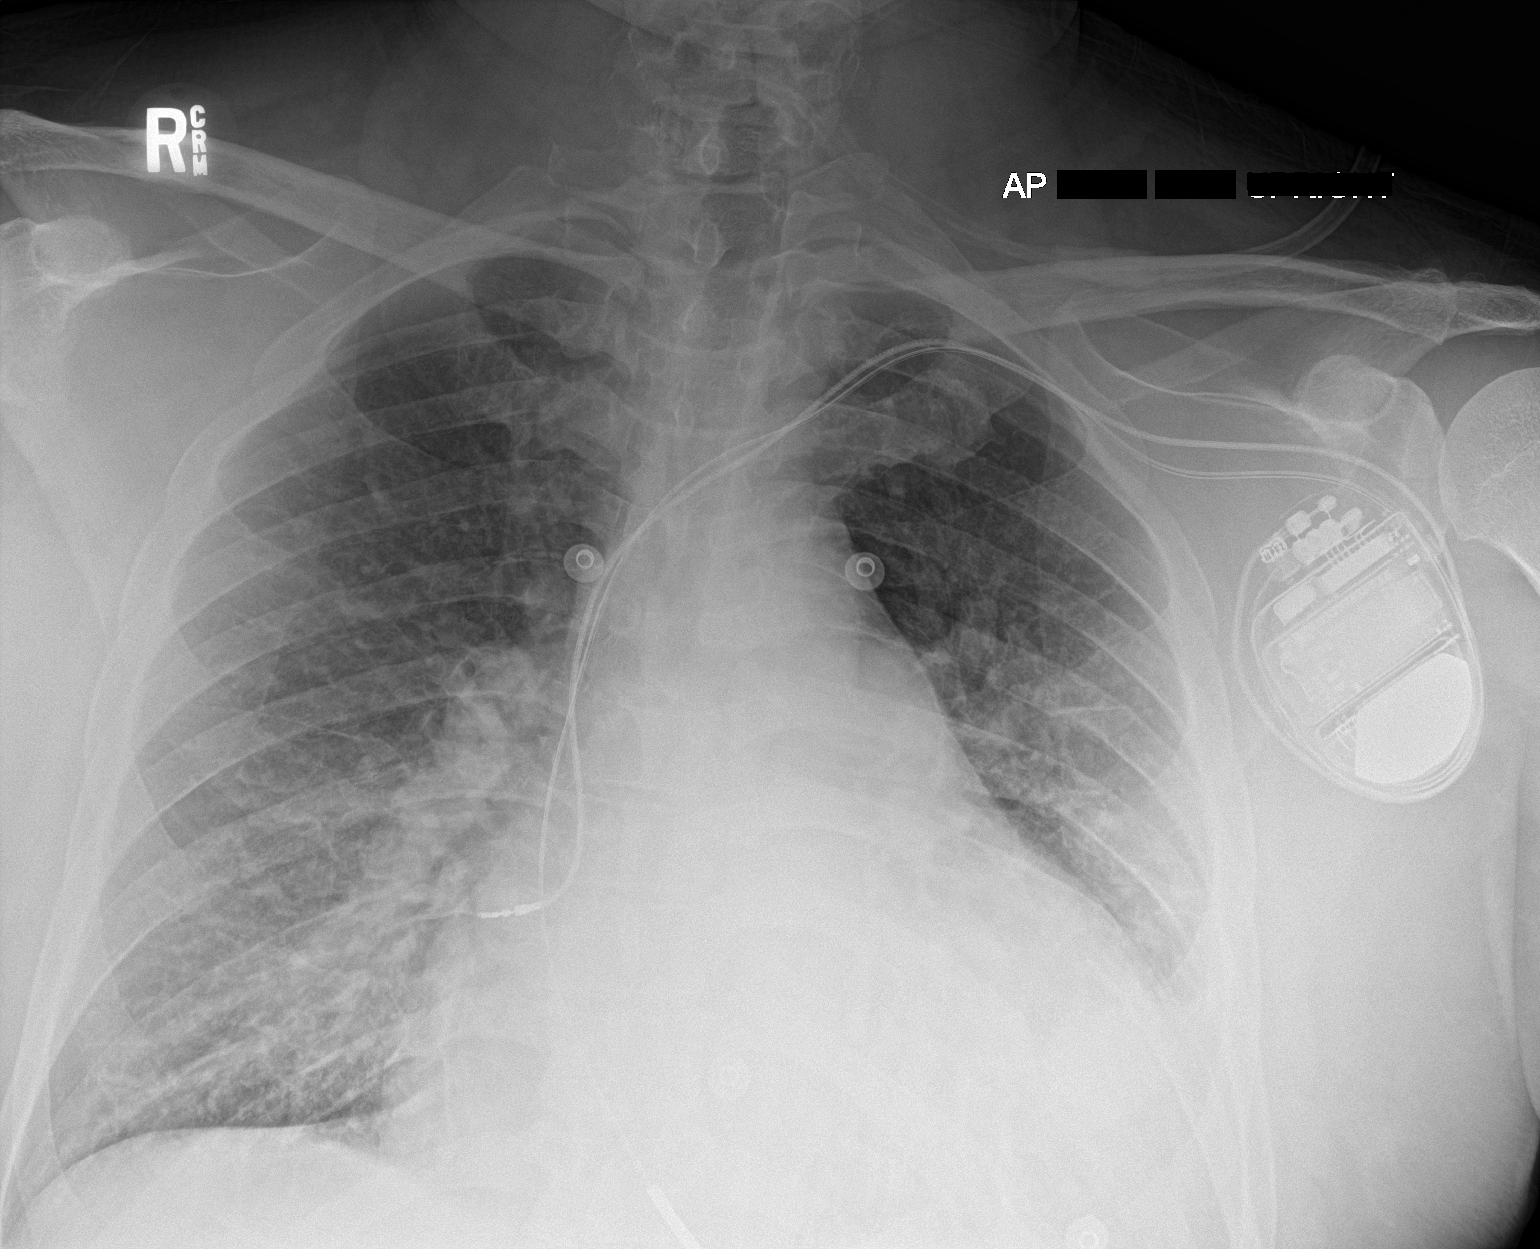

[1 of 1 positions shown; findings below may reference images not displayed]

FINDINGS: Cardiac silhouette markedly enlarged, unchanged. Interval
development of moderate diffuse interstitial pulmonary edema since
the most recent prior examination. No visible confluent airspace
consolidation. The previously identified circumscribed mass in the
LEFT lower lobe is much less conspicuous on the portable
examination.
IMPRESSION: Moderate CHF, with stable marked cardiomegaly and moderate diffuse
interstitial pulmonary edema.

## 2019-12-02 ENCOUNTER — Ambulatory Visit (INDEPENDENT_AMBULATORY_CARE_PROVIDER_SITE_OTHER): Payer: Medicare Other | Admitting: *Deleted

## 2019-12-02 DIAGNOSIS — I214 Non-ST elevation (NSTEMI) myocardial infarction: Secondary | ICD-10-CM | POA: Diagnosis not present

## 2019-12-02 LAB — CUP PACEART REMOTE DEVICE CHECK
Battery Remaining Longevity: 97 mo
Battery Voltage: 3 V
Brady Statistic AP VP Percent: 0 %
Brady Statistic AP VS Percent: 0.04 %
Brady Statistic AS VP Percent: 0.04 %
Brady Statistic AS VS Percent: 99.92 %
Brady Statistic RA Percent Paced: 0.04 %
Brady Statistic RV Percent Paced: 0.04 %
Date Time Interrogation Session: 20210608012502
HighPow Impedance: 71 Ohm
Implantable Lead Implant Date: 20180719
Implantable Lead Implant Date: 20180719
Implantable Lead Location: 753859
Implantable Lead Location: 753860
Implantable Lead Model: 5076
Implantable Pulse Generator Implant Date: 20180719
Lead Channel Impedance Value: 361 Ohm
Lead Channel Impedance Value: 418 Ohm
Lead Channel Impedance Value: 456 Ohm
Lead Channel Pacing Threshold Amplitude: 0.625 V
Lead Channel Pacing Threshold Amplitude: 0.875 V
Lead Channel Pacing Threshold Pulse Width: 0.4 ms
Lead Channel Pacing Threshold Pulse Width: 0.4 ms
Lead Channel Sensing Intrinsic Amplitude: 4.375 mV
Lead Channel Sensing Intrinsic Amplitude: 4.375 mV
Lead Channel Sensing Intrinsic Amplitude: 9.25 mV
Lead Channel Sensing Intrinsic Amplitude: 9.25 mV
Lead Channel Setting Pacing Amplitude: 1.75 V
Lead Channel Setting Pacing Amplitude: 2 V
Lead Channel Setting Pacing Pulse Width: 0.4 ms
Lead Channel Setting Sensing Sensitivity: 0.3 mV

## 2019-12-03 ENCOUNTER — Other Ambulatory Visit (HOSPITAL_COMMUNITY): Payer: Self-pay

## 2019-12-03 NOTE — Progress Notes (Signed)
Remote ICD transmission.   

## 2019-12-03 NOTE — Progress Notes (Signed)
Paramedicine Encounter    Patient ID: Richard [name], male    DOB: July 30, 1960, 59 y.o.   MRN: [id number]   Patient Care Team: Ananias Pilgrim, MD as PCP - General (Family Medicine) Laurey Morale, MD as PCP - Advanced Heart Failure (Cardiology) Burna Sis, LCSW as Social Worker (Licensed Clinical Social Worker)  Patient Active Problem List   Diagnosis Date Noted   Acute on chronic combined systolic and diastolic CHF (congestive heart failure) (HCC) 03/15/2018   S/P right and left heart catheterization 11/02/2017   Panic attacks 11/02/2017   Non-STEMI (non-ST elevated myocardial infarction) (HCC)    COPD (chronic obstructive pulmonary disease) (HCC)    Hypertension    CKD (chronic kidney disease)    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HCC)    ICD (implantable cardioverter-defibrillator) in place    GERD (gastroesophageal reflux disease)    Diabetes mellitus with diabetic neuropathy, with long-term current use of insulin (HCC)     Current Outpatient Medications:    albuterol (PROVENTIL) (2.5 MG/3ML) 0.083% nebulizer solution, Inhale into the lungs., Disp: , Rfl:    allopurinol (ZYLOPRIM) 100 MG tablet, Take 1 tablet (100 mg total) by mouth daily., Disp: 30 tablet, Rfl: 3   aspirin 81 MG chewable tablet, Chew 81 mg by mouth daily., Disp: , Rfl:    atorvastatin (LIPITOR) 40 MG tablet, TAKE 1 TABLET(40 MG) BY MOUTH DAILY AT 6 PM, Disp: 90 tablet, Rfl: 3   bisacodyl (DULCOLAX) 5 MG EC tablet, Take 5 mg by mouth daily as needed for moderate constipation., Disp: , Rfl:    BREO ELLIPTA 100-25 MCG/INH AEPB, Inhale 1 puff into the lungs daily., Disp: , Rfl: 5   digoxin (LANOXIN) 0.125 MG tablet, TAKE 1 TABLET(0.125 MG) BY MOUTH DAILY, Disp: 30 tablet, Rfl: 6   diphenhydrAMINE (BENADRYL) 25 MG tablet, Take 25 mg by mouth at bedtime as needed., Disp: , Rfl:    DULoxetine (CYMBALTA) 60 MG capsule, Take 60 mg by mouth daily., Disp: , Rfl: 2   eplerenone (INSPRA) 50 MG tablet, Take 1  tablet (50 mg total) by mouth daily., Disp: 30 tablet, Rfl: 5   hydrALAZINE (APRESOLINE) 25 MG tablet, Take 1 tablet (25 mg total) by mouth 3 (three) times daily., Disp: 270 tablet, Rfl: 3   Insulin Pen Needle (NOVOFINE) 30G X 8 MM MISC, Inject 10 each into the skin as needed., Disp: 100 each, Rfl: 0   JARDIANCE 25 MG TABS tablet, Take 25 mg by mouth daily., Disp: 30 tablet, Rfl: 6   MAGNESIUM-OXIDE 400 (241.3 Mg) MG tablet, Take 1 tablet (400 mg total) by mouth daily., Disp: 30 tablet, Rfl: 11   pregabalin (LYRICA) 100 MG capsule, Take 1 capsule (100 mg total) by mouth 2 (two) times daily., Disp: 60 capsule, Rfl: 3   sacubitril-valsartan (ENTRESTO) 97-103 MG, Take 1 tablet by mouth 2 (two) times daily., Disp: 180 tablet, Rfl: 3   sennosides-docusate sodium (SENOKOT-S) 8.6-50 MG tablet, Take 1 tablet by mouth daily., Disp: , Rfl:    torsemide (DEMADEX) 20 MG tablet, Take 2 tablets (40 mg total) by mouth daily., Disp: 180 tablet, Rfl: 3   Vitamin D, Cholecalciferol, 1000 units CAPS, Take 1,000 Units by mouth daily., Disp: , Rfl: 11   acetaminophen (TYLENOL) 650 MG CR tablet, Take 650 mg by mouth every 8 (eight) hours as needed for pain., Disp: , Rfl:    insulin regular human CONCENTRATED (HUMULIN R U-500 KWIKPEN) 500 UNIT/ML kwikpen, USE AS DIRECTED PER SLIDING SCALE. MAX  DAILY DOSE IS 400 UNITS., Disp: , Rfl:    isosorbide dinitrate (ISORDIL) 20 MG tablet, Take 1 tablet (20 mg total) by mouth 3 (three) times daily., Disp: 90 tablet, Rfl: 11   omeprazole (PRILOSEC) 20 MG capsule, Take 20 mg by mouth daily., Disp: , Rfl: 1   ranitidine (ZANTAC) 150 MG tablet, Take 150 mg by mouth 2 (two) times daily., Disp: , Rfl:    VASCEPA 1 g capsule, Take 2 capsules (2 g total) by mouth 2 (two) times daily. (Patient not taking: Reported on 12/03/2019), Disp: 120 capsule, Rfl: 5 Allergies  Allergen Reactions   Codeine    Coconut Oil Itching      Social History   Socioeconomic History    Marital status: Divorced    Spouse name: Not on file   Number of children: Not on file   Years of education: Not on file   Highest education level: Not on file  Occupational History   Occupation: disability    Comment: stopped working in 2000 d/t occupational exposures  Tobacco Use   Smoking status: Former Smoker    Types: Cigarettes   Smokeless tobacco: Never Used   Tobacco comment: "smoked when I drank"  Substance and Sexual Activity   Alcohol use: Not Currently    Comment: 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  Other Topics Concern   Not on file  Social History Narrative   Patient lives at home with wife and some of his 10 children. He self-administers his own medications.   Social Determinants of Health   Financial Resource Strain:    Difficulty of Paying Living Expenses:   Food Insecurity:    Worried About Programme researcher, broadcasting/film/video in the Last Year:    Barista in the Last Year:   Transportation Needs:    Freight forwarder (Medical):    Lack of Transportation (Non-Medical):   Physical Activity:    Days of Exercise per Week:    Minutes of Exercise per Session:   Stress:    Feeling of Stress :   Social Connections:    Frequency of Communication with Friends and Family:    Frequency of Social Gatherings with Friends and Family:    Attends Religious Services:    Active Member of Clubs or Organizations:    Attends Banker Meetings:    Marital Status:   Intimate Partner Violence:    Fear of Current or Ex-Partner:    Emotionally Abused:    Physically Abused:    Sexually Abused:     Physical Exam Cardiovascular:     Rate and Rhythm: Normal rate and regular rhythm.     Pulses: Normal pulses.  Pulmonary:     Effort: Pulmonary effort is normal.     Breath sounds: Normal breath sounds.  Musculoskeletal:        General: Normal range of motion.      Right lower leg: No edema.     Left lower leg: No edema.  Skin:    General: Skin is warm.     Capillary Refill: Capillary refill takes less than 2 seconds.  Neurological:     Mental Status: He is alert and oriented to person, place, and time.  Psychiatric:        Mood and Affect: Mood normal.         Future Appointments  Date Time Provider Department  Center  01/02/2020  2:40 PM Larey Dresser, MD [facility] None  03/02/2020  8:10 AM [facility]  06/01/2020  8:10 AM [facility]    BP 98/70 (BP Location: Left Arm, Patient Position: Sitting, Cuff Size: Normal)    Pulse 86    Resp 16    Wt 259 lb (117.5 kg)    SpO2 95%    BMI 30.71 kg/m   Weight yesterday- 259 lb Last visit weight- 259 lb  Richard [name] was seen at home today and reported feeling generally well. He denied chest pain, SOB, headache, dizziness, orthopnea, fever or cough since our last visit. He was not seen last week due due to missing my phone calls but stated he had filled his box on his own. I checked the box and noted several errors so we addressed them and started over. He told me that he stopped taking Vascepa because he heard on the news that it causes bleeding and he has been having rectal bleeding. He stated the bleeding stopped when he stopped the Vascepa so I contacted the HF clinic who advised he should call his PCP about the bleeding and see if they want to prescribe something else for his triglycerides. My Gillie stated he has an appointment coming up with his PCP so he would speak with them then. His medications were verified and his pillbox was refilled. I will follow up next week.   Jacquiline Doe, EMT 12/03/19  ACTION: Home visit completed Next visit planned for 1 week

## 2019-12-11 ENCOUNTER — Telehealth (HOSPITAL_COMMUNITY): Payer: Self-pay

## 2019-12-11 NOTE — Telephone Encounter (Signed)
I called Mr Advani to schedule an appointment. He did not answer so I left a message requesting he call me back.   [name], EMT 12/11/19

## 2019-12-11 NOTE — Telephone Encounter (Signed)
Mr Schonberg returned my phone call this afternoon. By the time he called I no longer had time to see him before the end of the day. I asked if I could come by tomorrow and he said he would be in [place] at 12:00 and was unsure what time he would return. I asked if he would be able to manage his medications until I can get out there next week and he said he could. I will follow up next week.   [name], EMT 12/11/19

## 2019-12-17 ENCOUNTER — Other Ambulatory Visit (HOSPITAL_COMMUNITY): Payer: Self-pay

## 2019-12-17 NOTE — Progress Notes (Signed)
Paramedicine Encounter    Patient ID: Richard [name], male    DOB: 04/04/1961, 59 y.o.   MRN: [id number]   Patient Care Team: Ananias Pilgrim, MD as PCP - General (Family Medicine) Laurey Morale, MD as PCP - Advanced Heart Failure (Cardiology) Burna Sis, LCSW as Social Worker (Licensed Clinical Social Worker)  Patient Active Problem List   Diagnosis Date Noted  . Acute on chronic combined systolic and diastolic CHF (congestive heart failure) (HCC) 03/15/2018  . S/P right and left heart catheterization 11/02/2017  . Panic attacks 11/02/2017  . Non-STEMI (non-ST elevated myocardial infarction) (HCC)   . COPD (chronic obstructive pulmonary disease) (HCC)   . Hypertension   . CKD (chronic kidney disease)   . 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HCC)   . ICD (implantable cardioverter-defibrillator) in place   . GERD (gastroesophageal reflux disease)   . Diabetes mellitus with diabetic neuropathy, with long-term current use of insulin (HCC)     Current Outpatient Medications:  .  allopurinol (ZYLOPRIM) 100 MG tablet, Take 1 tablet (100 mg total) by mouth daily., Disp: 30 tablet, Rfl: 3 .  aspirin 81 MG chewable tablet, Chew 81 mg by mouth daily., Disp: , Rfl:  .  atorvastatin (LIPITOR) 40 MG tablet, TAKE 1 TABLET(40 MG) BY MOUTH DAILY AT 6 PM, Disp: 90 tablet, Rfl: 3 .  bisacodyl (DULCOLAX) 5 MG EC tablet, Take 5 mg by mouth daily as needed for moderate constipation., Disp: , Rfl:  .  BREO ELLIPTA 100-25 MCG/INH AEPB, Inhale 1 puff into the lungs daily., Disp: , Rfl: 5 .  digoxin (LANOXIN) 0.125 MG tablet, TAKE 1 TABLET(0.125 MG) BY MOUTH DAILY, Disp: 30 tablet, Rfl: 6 .  DULoxetine (CYMBALTA) 60 MG capsule, Take 60 mg by mouth daily., Disp: , Rfl: 2 .  eplerenone (INSPRA) 50 MG tablet, Take 1 tablet (50 mg total) by mouth daily., Disp: 30 tablet, Rfl: 5 .  hydrALAZINE (APRESOLINE) 25 MG tablet, Take 1 tablet (25 mg total) by mouth 3 (three) times daily., Disp: 270 tablet, Rfl: 3 .  Insulin Pen  Needle (NOVOFINE) 30G X 8 MM MISC, Inject 10 each into the skin as needed., Disp: 100 each, Rfl: 0 .  insulin regular human CONCENTRATED (HUMULIN R U-500 KWIKPEN) 500 UNIT/ML kwikpen, USE AS DIRECTED PER SLIDING SCALE. MAX DAILY DOSE IS 400 UNITS., Disp: , Rfl:  .  isosorbide dinitrate (ISORDIL) 20 MG tablet, Take 1 tablet (20 mg total) by mouth 3 (three) times daily., Disp: 90 tablet, Rfl: 11 .  JARDIANCE 25 MG TABS tablet, Take 25 mg by mouth daily., Disp: 30 tablet, Rfl: 6 .  MAGNESIUM-OXIDE 400 (241.3 Mg) MG tablet, Take 1 tablet (400 mg total) by mouth daily., Disp: 30 tablet, Rfl: 11 .  omeprazole (PRILOSEC) 20 MG capsule, Take 20 mg by mouth daily., Disp: , Rfl: 1 .  pregabalin (LYRICA) 100 MG capsule, Take 1 capsule (100 mg total) by mouth 2 (two) times daily., Disp: 60 capsule, Rfl: 3 .  sacubitril-valsartan (ENTRESTO) 97-103 MG, Take 1 tablet by mouth 2 (two) times daily., Disp: 180 tablet, Rfl: 3 .  sennosides-docusate sodium (SENOKOT-S) 8.6-50 MG tablet, Take 1 tablet by mouth daily., Disp: , Rfl:  .  torsemide (DEMADEX) 20 MG tablet, Take 2 tablets (40 mg total) by mouth daily., Disp: 180 tablet, Rfl: 3 .  Vitamin D, Cholecalciferol, 1000 units CAPS, Take 1,000 Units by mouth daily., Disp: , Rfl: 11 .  acetaminophen (TYLENOL) 650 MG CR tablet, Take 650 mg  by mouth every 8 (eight) hours as needed for pain., Disp: , Rfl:  .  albuterol (PROVENTIL) (2.5 MG/3ML) 0.083% nebulizer solution, Inhale into the lungs., Disp: , Rfl:  .  diphenhydrAMINE (BENADRYL) 25 MG tablet, Take 25 mg by mouth at bedtime as needed., Disp: , Rfl:  .  ranitidine (ZANTAC) 150 MG tablet, Take 150 mg by mouth 2 (two) times daily. (Patient not taking: Reported on 12/17/2019), Disp: , Rfl:  .  VASCEPA 1 g capsule, Take 2 capsules (2 g total) by mouth 2 (two) times daily. (Patient not taking: Reported on 12/03/2019), Disp: 120 capsule, Rfl: 5 Allergies  Allergen Reactions  . Codeine   . Coconut Oil Itching       Social History   Socioeconomic History  . Marital status: Divorced    Spouse name: Not on file  . Number of children: Not on file  . Years of education: Not on file  . Highest education level: Not on file  Occupational History  . Occupation: disability    Comment: stopped working in 2000 d/t occupational exposures  Tobacco Use  . Smoking status: Former Smoker    Types: Cigarettes  . Smokeless tobacco: Never Used  . Tobacco comment: "smoked when I drank"  Vaping Use  . Vaping Use: Never used  Substance and Sexual Activity  . 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 Drug use: Never  . Sexual activity: Not Currently  Other Topics Concern  . Not on file  Social History Narrative   Patient lives at home with wife and some of his 10 children. He self-administers his own medications.   Social Determinants of Health   Financial Resource Strain:   . Difficulty of Paying Living Expenses:   Food Insecurity:   . Worried About Programme researcher, broadcasting/film/video in the Last Year:   . Barista in the Last Year:   Transportation Needs:   . Freight forwarder (Medical):   Marland Kitchen Lack of Transportation (Non-Medical):   Physical Activity:   . Days of Exercise per Week:   . Minutes of Exercise per Session:   Stress:   . Feeling of Stress :   Social Connections:   . Frequency of Communication with Friends and Family:   . Frequency of Social Gatherings with Friends and Family:   . Attends Religious Services:   . Active Member of Clubs or Organizations:   . Attends Banker Meetings:   Marland Kitchen Marital Status:   Intimate Partner Violence:   . Fear of Current or Ex-Partner:   . Emotionally Abused:   Marland Kitchen Physically Abused:   . Sexually Abused:     Physical Exam Cardiovascular:     Rate and Rhythm: Normal rate and regular rhythm.     Pulses: Normal pulses.  Pulmonary:     Effort: Pulmonary effort is normal.     Breath  sounds: Normal breath sounds.  Musculoskeletal:        General: Normal range of motion.     Right lower leg: Edema present.     Left lower leg: Edema present.  Skin:    General: Skin is warm and dry.     Capillary Refill: Capillary refill takes less than 2 seconds.  Neurological:     Mental Status: He is alert and oriented to person, place, and time.  Psychiatric:        Mood and Affect:  Mood normal.         Future Appointments  Date Time Provider [place]  01/02/2020  2:40 PM Larey Dresser, MD [facility] None  03/02/2020  8:10 AM [facility] DEVICE REMOTES [facility]  06/01/2020  8:10 AM [facility] DEVICE REMOTES [facility]    BP 106/79 (BP Location: Left Arm, Patient Position: Sitting, Cuff Size: Normal)   Pulse 78   Resp 16   Wt 260 lb (117.9 kg)   SpO2 91%   BMI 30.83 kg/m   Weight yesterday- 259 lb Last visit weight- 259 lb  Mr Bazen was seen at home today and reported feeling well. He denied chest pain, SOB, headache, dizziness, orthopnea, fever or cough over the past two weeks. He stated he has been compliant with his medications however I was unable to see him last week so I cannot confirm this. His medications were verified and his pillbox was refilled. He did not have enough isosorbide or lyrica to finish his box so I ordered them and will follow up Friday to finish his pillbox, Mr Ashby advised he would be sure to pick it up by tomorrow.   Jacquiline Doe, EMT 12/17/19  ACTION: Home visit completed Next visit planned for Friday

## 2019-12-18 ENCOUNTER — Other Ambulatory Visit (HOSPITAL_COMMUNITY): Payer: Self-pay | Admitting: *Deleted

## 2019-12-18 MED ORDER — ATORVASTATIN CALCIUM 40 MG PO TABS: ORAL_TABLET | ORAL | 3 refills | Status: DC

## 2019-12-23 ENCOUNTER — Telehealth (HOSPITAL_COMMUNITY): Payer: Self-pay

## 2019-12-23 NOTE — Telephone Encounter (Signed)
I called Mr Mccoin to schedule a visit for tomorrow. He stated he would be available in the afternoon so I suggested 15:00. He was understanding and agreeable with the time.   [name], EMT 12/23/19

## 2019-12-24 ENCOUNTER — Other Ambulatory Visit (HOSPITAL_COMMUNITY): Payer: Self-pay

## 2019-12-24 NOTE — Progress Notes (Signed)
Paramedicine Encounter    Patient ID: Richard [name], male    DOB: Jan 25, 1961, 58 y.o.   MRN: [id number]   Patient Care Team: Ananias Pilgrim, MD as PCP - General (Family Medicine) Laurey Morale, MD as PCP - Advanced Heart Failure (Cardiology) Burna Sis, LCSW as Social Worker (Licensed Clinical Social Worker)  Patient Active Problem List   Diagnosis Date Noted  . Acute on chronic combined systolic and diastolic CHF (congestive heart failure) (HCC) 03/15/2018  . S/P right and left heart catheterization 11/02/2017  . Panic attacks 11/02/2017  . Non-STEMI (non-ST elevated myocardial infarction) (HCC)   . COPD (chronic obstructive pulmonary disease) (HCC)   . Hypertension   . CKD (chronic kidney disease)   . 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HCC)   . ICD (implantable cardioverter-defibrillator) in place   . GERD (gastroesophageal reflux disease)   . Diabetes mellitus with diabetic neuropathy, with long-term current use of insulin (HCC)     Current Outpatient Medications:  .  acetaminophen (TYLENOL) 650 MG CR tablet, Take 650 mg by mouth every 8 (eight) hours as needed for pain., Disp: , Rfl:  .  albuterol (PROVENTIL) (2.5 MG/3ML) 0.083% nebulizer solution, Inhale into the lungs., Disp: , Rfl:  .  allopurinol (ZYLOPRIM) 100 MG tablet, Take 1 tablet (100 mg total) by mouth daily., Disp: 30 tablet, Rfl: 3 .  aspirin 81 MG chewable tablet, Chew 81 mg by mouth daily., Disp: , Rfl:  .  atorvastatin (LIPITOR) 40 MG tablet, TAKE 1 TABLET(40 MG) BY MOUTH DAILY AT 6 PM, Disp: 90 tablet, Rfl: 3 .  bisacodyl (DULCOLAX) 5 MG EC tablet, Take 5 mg by mouth daily as needed for moderate constipation., Disp: , Rfl:  .  BREO ELLIPTA 100-25 MCG/INH AEPB, Inhale 1 puff into the lungs daily., Disp: , Rfl: 5 .  digoxin (LANOXIN) 0.125 MG tablet, TAKE 1 TABLET(0.125 MG) BY MOUTH DAILY, Disp: 30 tablet, Rfl: 6 .  diphenhydrAMINE (BENADRYL) 25 MG tablet, Take 25 mg by mouth at bedtime as needed., Disp: , Rfl:  .   DULoxetine (CYMBALTA) 60 MG capsule, Take 60 mg by mouth daily., Disp: , Rfl: 2 .  eplerenone (INSPRA) 50 MG tablet, Take 1 tablet (50 mg total) by mouth daily., Disp: 30 tablet, Rfl: 5 .  hydrALAZINE (APRESOLINE) 25 MG tablet, Take 1 tablet (25 mg total) by mouth 3 (three) times daily., Disp: 270 tablet, Rfl: 3 .  Insulin Pen Needle (NOVOFINE) 30G X 8 MM MISC, Inject 10 each into the skin as needed., Disp: 100 each, Rfl: 0 .  insulin regular human CONCENTRATED (HUMULIN R U-500 KWIKPEN) 500 UNIT/ML kwikpen, USE AS DIRECTED PER SLIDING SCALE. MAX DAILY DOSE IS 400 UNITS., Disp: , Rfl:  .  isosorbide dinitrate (ISORDIL) 20 MG tablet, Take 1 tablet (20 mg total) by mouth 3 (three) times daily., Disp: 90 tablet, Rfl: 11 .  JARDIANCE 25 MG TABS tablet, Take 25 mg by mouth daily., Disp: 30 tablet, Rfl: 6 .  MAGNESIUM-OXIDE 400 (241.3 Mg) MG tablet, Take 1 tablet (400 mg total) by mouth daily., Disp: 30 tablet, Rfl: 11 .  omeprazole (PRILOSEC) 20 MG capsule, Take 20 mg by mouth daily., Disp: , Rfl: 1 .  pregabalin (LYRICA) 100 MG capsule, Take 1 capsule (100 mg total) by mouth 2 (two) times daily., Disp: 60 capsule, Rfl: 3 .  ranitidine (ZANTAC) 150 MG tablet, Take 150 mg by mouth 2 (two) times daily. (Patient not taking: Reported on 12/17/2019), Disp: , Rfl:  .  sacubitril-valsartan (ENTRESTO) 97-103 MG, Take 1 tablet by mouth 2 (two) times daily., Disp: 180 tablet, Rfl: 3 .  sennosides-docusate sodium (SENOKOT-S) 8.6-50 MG tablet, Take 1 tablet by mouth daily., Disp: , Rfl:  .  torsemide (DEMADEX) 20 MG tablet, Take 2 tablets (40 mg total) by mouth daily., Disp: 180 tablet, Rfl: 3 .  VASCEPA 1 g capsule, Take 2 capsules (2 g total) by mouth 2 (two) times daily. (Patient not taking: Reported on 12/03/2019), Disp: 120 capsule, Rfl: 5 .  Vitamin D, Cholecalciferol, 1000 units CAPS, Take 1,000 Units by mouth daily., Disp: , Rfl: 11 Allergies  Allergen Reactions  . Codeine   . Coconut Oil Itching       Social History   Socioeconomic History  . Marital status: Divorced    Spouse name: Not on file  . Number of children: Not on file  . Years of education: Not on file  . Highest education level: Not on file  Occupational History  . Occupation: disability    Comment: stopped working in 2000 d/t occupational exposures  Tobacco Use  . Smoking status: Former Smoker    Types: Cigarettes  . Smokeless tobacco: Never Used  . Tobacco comment: "smoked when I drank"  Vaping Use  . Vaping Use: Never used  Substance and Sexual Activity  . 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 Drug use: Never  . Sexual activity: Not Currently  Other Topics Concern  . Not on file  Social History Narrative   Patient lives at home with wife and some of his 10 children. He self-administers his own medications.   Social Determinants of Health   Financial Resource Strain:   . Difficulty of Paying Living Expenses:   Food Insecurity:   . Worried About Programme researcher, broadcasting/film/video in the Last Year:   . Barista in the Last Year:   Transportation Needs:   . Freight forwarder (Medical):   Marland Kitchen Lack of Transportation (Non-Medical):   Physical Activity:   . Days of Exercise per Week:   . Minutes of Exercise per Session:   Stress:   . Feeling of Stress :   Social Connections:   . Frequency of Communication with Friends and Family:   . Frequency of Social Gatherings with Friends and Family:   . Attends Religious Services:   . Active Member of Clubs or Organizations:   . Attends Banker Meetings:   Marland Kitchen Marital Status:   Intimate Partner Violence:   . Fear of Current or Ex-Partner:   . Emotionally Abused:   Marland Kitchen Physically Abused:   . Sexually Abused:     Physical Exam Cardiovascular:     Rate and Rhythm: Normal rate and regular rhythm.     Pulses: Normal pulses.  Pulmonary:     Effort: Pulmonary effort is normal.     Breath  sounds: Normal breath sounds.  Musculoskeletal:        General: Normal range of motion.     Right lower leg: No edema.     Left lower leg: No edema.  Skin:    General: Skin is warm and dry.     Capillary Refill: Capillary refill takes less than 2 seconds.  Neurological:     Mental Status: He is alert and oriented to person, place, and time.  Psychiatric:        Mood and Affect: Mood normal.  Future Appointments  Date Time Provider Department Center  01/02/2020  2:40 PM Laurey Morale, MD [facility] None  03/02/2020  8:10 AM [facility] DEVICE REMOTES [facility]  06/01/2020  8:10 AM [facility] DEVICE REMOTES [facility]    BP 117/79 (BP Location: Left Arm, Patient Position: Sitting, Cuff Size: Normal)   Pulse 77   Resp 16   Wt 254 lb 14.4 oz (115.6 kg)   SpO2 95%   BMI 30.23 kg/m   Weight yesterday- did not weigh Last visit weight- 260 lb  Mr Alldredge was seen at home today and reported feeling well. He denied chest pain, SOB, headache, dizziness, orthopnea, fever or cough over the past week. He stated he has been compliant with his medications and his weight has been trending down. He reports eating healthier and exercising more. His medications were verified and his pillbox was refilled. I will follow up next week.   [name], EMT 12/24/19  ACTION: Home visit completed Next visit planned for 1 week

## 2019-12-31 ENCOUNTER — Other Ambulatory Visit (HOSPITAL_COMMUNITY): Payer: Self-pay | Admitting: *Deleted

## 2019-12-31 ENCOUNTER — Other Ambulatory Visit (HOSPITAL_COMMUNITY): Payer: Self-pay

## 2019-12-31 MED ORDER — OMEPRAZOLE 20 MG PO CPDR: 20.0000 mg | DELAYED_RELEASE_CAPSULE | Freq: Every day | ORAL | 1 refills | Status: DC

## 2019-12-31 NOTE — Progress Notes (Signed)
Paramedicine Encounter    Patient ID: Richard [name], male    DOB: Jun 15, 1961, 59 y.o.   MRN: [id number]   Patient Care Team: Ananias Pilgrim, MD as PCP - General (Family Medicine) Laurey Morale, MD as PCP - Advanced Heart Failure (Cardiology) Burna Sis, LCSW as Social Worker (Licensed Clinical Social Worker)  Patient Active Problem List   Diagnosis Date Noted  . Acute on chronic combined systolic and diastolic CHF (congestive heart failure) (HCC) 03/15/2018  . S/P right and left heart catheterization 11/02/2017  . Panic attacks 11/02/2017  . Non-STEMI (non-ST elevated myocardial infarction) (HCC)   . COPD (chronic obstructive pulmonary disease) (HCC)   . Hypertension   . CKD (chronic kidney disease)   . 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HCC)   . ICD (implantable cardioverter-defibrillator) in place   . GERD (gastroesophageal reflux disease)   . Diabetes mellitus with diabetic neuropathy, with long-term current use of insulin (HCC)     Current Outpatient Medications:  .  acetaminophen (TYLENOL) 650 MG CR tablet, Take 650 mg by mouth every 8 (eight) hours as needed for pain., Disp: , Rfl:  .  albuterol (PROVENTIL) (2.5 MG/3ML) 0.083% nebulizer solution, Inhale into the lungs., Disp: , Rfl:  .  allopurinol (ZYLOPRIM) 100 MG tablet, Take 1 tablet (100 mg total) by mouth daily., Disp: 30 tablet, Rfl: 3 .  aspirin 81 MG chewable tablet, Chew 81 mg by mouth daily., Disp: , Rfl:  .  atorvastatin (LIPITOR) 40 MG tablet, TAKE 1 TABLET(40 MG) BY MOUTH DAILY AT 6 PM, Disp: 90 tablet, Rfl: 3 .  bisacodyl (DULCOLAX) 5 MG EC tablet, Take 5 mg by mouth daily as needed for moderate constipation., Disp: , Rfl:  .  BREO ELLIPTA 100-25 MCG/INH AEPB, Inhale 1 puff into the lungs daily., Disp: , Rfl: 5 .  digoxin (LANOXIN) 0.125 MG tablet, TAKE 1 TABLET(0.125 MG) BY MOUTH DAILY, Disp: 30 tablet, Rfl: 6 .  diphenhydrAMINE (BENADRYL) 25 MG tablet, Take 25 mg by mouth at bedtime as needed., Disp: , Rfl:  .   DULoxetine (CYMBALTA) 60 MG capsule, Take 60 mg by mouth daily., Disp: , Rfl: 2 .  eplerenone (INSPRA) 50 MG tablet, Take 1 tablet (50 mg total) by mouth daily., Disp: 30 tablet, Rfl: 5 .  hydrALAZINE (APRESOLINE) 25 MG tablet, Take 1 tablet (25 mg total) by mouth 3 (three) times daily., Disp: 270 tablet, Rfl: 3 .  Insulin Pen Needle (NOVOFINE) 30G X 8 MM MISC, Inject 10 each into the skin as needed., Disp: 100 each, Rfl: 0 .  insulin regular human CONCENTRATED (HUMULIN R U-500 KWIKPEN) 500 UNIT/ML kwikpen, USE AS DIRECTED PER SLIDING SCALE. MAX DAILY DOSE IS 400 UNITS., Disp: , Rfl:  .  isosorbide dinitrate (ISORDIL) 20 MG tablet, Take 1 tablet (20 mg total) by mouth 3 (three) times daily., Disp: 90 tablet, Rfl: 11 .  JARDIANCE 25 MG TABS tablet, Take 25 mg by mouth daily., Disp: 30 tablet, Rfl: 6 .  MAGNESIUM-OXIDE 400 (241.3 Mg) MG tablet, Take 1 tablet (400 mg total) by mouth daily., Disp: 30 tablet, Rfl: 11 .  omeprazole (PRILOSEC) 20 MG capsule, Take 20 mg by mouth daily. (Patient not taking: Reported on 12/24/2019), Disp: , Rfl: 1 .  pregabalin (LYRICA) 100 MG capsule, Take 1 capsule (100 mg total) by mouth 2 (two) times daily., Disp: 60 capsule, Rfl: 3 .  ranitidine (ZANTAC) 150 MG tablet, Take 150 mg by mouth 2 (two) times daily. (Patient not taking: Reported  on 12/17/2019), Disp: , Rfl:  .  sacubitril-valsartan (ENTRESTO) 97-103 MG, Take 1 tablet by mouth 2 (two) times daily., Disp: 180 tablet, Rfl: 3 .  sennosides-docusate sodium (SENOKOT-S) 8.6-50 MG tablet, Take 1 tablet by mouth daily., Disp: , Rfl:  .  torsemide (DEMADEX) 20 MG tablet, Take 2 tablets (40 mg total) by mouth daily., Disp: 180 tablet, Rfl: 3 .  VASCEPA 1 g capsule, Take 2 capsules (2 g total) by mouth 2 (two) times daily. (Patient not taking: Reported on 12/03/2019), Disp: 120 capsule, Rfl: 5 .  Vitamin D, Cholecalciferol, 1000 units CAPS, Take 1,000 Units by mouth daily., Disp: , Rfl: 11 Allergies  Allergen Reactions  .  Codeine   . Coconut Oil Itching      Social History   Socioeconomic History  . Marital status: Divorced    Spouse name: Not on file  . Number of children: Not on file  . Years of education: Not on file  . Highest education level: Not on file  Occupational History  . Occupation: disability    Comment: stopped working in 2000 d/t occupational exposures  Tobacco Use  . Smoking status: Former Smoker    Types: Cigarettes  . Smokeless tobacco: Never Used  . Tobacco comment: "smoked when I drank"  Vaping Use  . Vaping Use: Never used  Substance and Sexual Activity  . 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 Drug use: Never  . Sexual activity: Not Currently  Other Topics Concern  . Not on file  Social History Narrative   Patient lives at home with wife and some of his 10 children. He self-administers his own medications.   Social Determinants of Health   Financial Resource Strain:   . Difficulty of Paying Living Expenses:   Food Insecurity:   . Worried About Programme researcher, broadcasting/film/video in the Last Year:   . Barista in the Last Year:   Transportation Needs:   . Freight forwarder (Medical):   Richard Kitchen Lack of Transportation (Non-Medical):   Physical Activity:   . Days of Exercise per Week:   . Minutes of Exercise per Session:   Stress:   . Feeling of Stress :   Social Connections:   . Frequency of Communication with Friends and Family:   . Frequency of Social Gatherings with Friends and Family:   . Attends Religious Services:   . Active Member of Clubs or Organizations:   . Attends Banker Meetings:   Richard Kitchen Marital Status:   Intimate Partner Violence:   . Fear of Current or Ex-Partner:   . Emotionally Abused:   Richard Kitchen Physically Abused:   . Sexually Abused:     Physical Exam Cardiovascular:     Rate and Rhythm: Normal rate and regular rhythm.     Pulses: Normal pulses.  Pulmonary:     Effort: Pulmonary  effort is normal.     Breath sounds: Normal breath sounds.  Musculoskeletal:        General: Normal range of motion.     Right lower leg: Edema present.     Left lower leg: Edema present.  Skin:    General: Skin is warm.     Capillary Refill: Capillary refill takes less than 2 seconds.  Neurological:     Mental Status: He is alert and oriented to person, place, and time.  Psychiatric:  Mood and Affect: Mood normal.         Future Appointments  Date Time Provider Department Center  01/02/2020  2:40 PM Laurey Morale, MD [facility] None  03/02/2020  8:10 AM [facility] DEVICE REMOTES [facility]  06/01/2020  8:10 AM [facility] DEVICE REMOTES [facility]    BP 110/77 (BP Location: Left Arm, Patient Position: Sitting, Cuff Size: Normal)   Pulse 74   Resp 18   Wt 256 lb (116.1 kg)   SpO2 94%   BMI 30.36 kg/m   Weight yesterday- 256 lb Last visit weight- 254 lb  Mr [name] was seen at home today and reported feeling well. He denied chest pain, SOB. Headache, dizziness, orthopnea, fever or cough over the past week. He stated he has been compliant with his medications and his weight has been stable. He has been taking an OTC heartburn medication over the past week but upon speaking with pharmacist Karle Plumber, he was advised to discontinue using it due to multiple medication interactions. The clinic ordered a prescription of omeprazole to get him to his appointment next month with his PCP. This appointment is Aug 6 @ 11:00 at the [facility] office with Dr Nedra Hai. His medications were verified and his pillbox was refilled. I will follow up next week.   Richard [name], EMT 12/31/19  ACTION: Home visit completed Next visit planned for 1 week

## 2020-01-02 ENCOUNTER — Other Ambulatory Visit (HOSPITAL_COMMUNITY): Payer: Self-pay

## 2020-01-02 ENCOUNTER — Encounter (HOSPITAL_COMMUNITY): Payer: Self-pay | Admitting: Cardiology

## 2020-01-02 ENCOUNTER — Ambulatory Visit (HOSPITAL_COMMUNITY)
Admission: RE | Admit: 2020-01-02 | Discharge: 2020-01-02 | Disposition: A | Discharge status: Home / Self Care | Payer: Medicare Other | Source: Ambulatory Visit | Attending: Cardiology | Admitting: Cardiology

## 2020-01-02 ENCOUNTER — Other Ambulatory Visit: Payer: Self-pay

## 2020-01-02 VITALS — BP 124/88 | HR 42 | Wt 258.0 lb

## 2020-01-02 DIAGNOSIS — I2510 Atherosclerotic heart disease of native coronary artery without angina pectoris: Secondary | ICD-10-CM | POA: Insufficient documentation

## 2020-01-02 DIAGNOSIS — Z833 Family history of diabetes mellitus: Secondary | ICD-10-CM | POA: Diagnosis not present

## 2020-01-02 DIAGNOSIS — E1122 Type 2 diabetes mellitus with diabetic chronic kidney disease: Secondary | ICD-10-CM | POA: Diagnosis not present

## 2020-01-02 DIAGNOSIS — Z7982 Long term (current) use of aspirin: Secondary | ICD-10-CM | POA: Insufficient documentation

## 2020-01-02 DIAGNOSIS — E1140 Type 2 diabetes mellitus with diabetic neuropathy, unspecified: Secondary | ICD-10-CM | POA: Diagnosis not present

## 2020-01-02 DIAGNOSIS — K219 Gastro-esophageal reflux disease without esophagitis: Secondary | ICD-10-CM | POA: Diagnosis not present

## 2020-01-02 DIAGNOSIS — J449 Chronic obstructive pulmonary disease, unspecified: Secondary | ICD-10-CM | POA: Diagnosis not present

## 2020-01-02 DIAGNOSIS — Z79899 Other long term (current) drug therapy: Secondary | ICD-10-CM | POA: Insufficient documentation

## 2020-01-02 DIAGNOSIS — Z955 Presence of coronary angioplasty implant and graft: Secondary | ICD-10-CM | POA: Diagnosis not present

## 2020-01-02 DIAGNOSIS — I5042 Chronic combined systolic (congestive) and diastolic (congestive) heart failure: Secondary | ICD-10-CM

## 2020-01-02 DIAGNOSIS — Z8249 Family history of ischemic heart disease and other diseases of the circulatory system: Secondary | ICD-10-CM | POA: Diagnosis not present

## 2020-01-02 DIAGNOSIS — E785 Hyperlipidemia, unspecified: Secondary | ICD-10-CM | POA: Diagnosis not present

## 2020-01-02 DIAGNOSIS — Z5731 Occupational exposure to environmental tobacco smoke: Secondary | ICD-10-CM | POA: Insufficient documentation

## 2020-01-02 DIAGNOSIS — I428 Other cardiomyopathies: Secondary | ICD-10-CM | POA: Insufficient documentation

## 2020-01-02 DIAGNOSIS — Z885 Allergy status to narcotic agent status: Secondary | ICD-10-CM | POA: Insufficient documentation

## 2020-01-02 DIAGNOSIS — D751 Secondary polycythemia: Secondary | ICD-10-CM | POA: Diagnosis not present

## 2020-01-02 DIAGNOSIS — Z9981 Dependence on supplemental oxygen: Secondary | ICD-10-CM | POA: Diagnosis not present

## 2020-01-02 DIAGNOSIS — Z794 Long term (current) use of insulin: Secondary | ICD-10-CM | POA: Diagnosis not present

## 2020-01-02 DIAGNOSIS — Z7951 Long term (current) use of inhaled steroids: Secondary | ICD-10-CM | POA: Insufficient documentation

## 2020-01-02 DIAGNOSIS — G4733 Obstructive sleep apnea (adult) (pediatric): Secondary | ICD-10-CM | POA: Insufficient documentation

## 2020-01-02 DIAGNOSIS — I130 Hypertensive heart and chronic kidney disease with heart failure and stage 1 through stage 4 chronic kidney disease, or unspecified chronic kidney disease: Secondary | ICD-10-CM | POA: Diagnosis not present

## 2020-01-02 DIAGNOSIS — N183 Chronic kidney disease, stage 3 unspecified: Secondary | ICD-10-CM | POA: Diagnosis not present

## 2020-01-02 DIAGNOSIS — E781 Pure hyperglyceridemia: Secondary | ICD-10-CM | POA: Insufficient documentation

## 2020-01-02 DIAGNOSIS — I5022 Chronic systolic (congestive) heart failure: Secondary | ICD-10-CM | POA: Diagnosis not present

## 2020-01-02 LAB — BASIC METABOLIC PANEL
Anion gap: 11 (ref 5–15)
BUN: 18 mg/dL (ref 6–20)
CO2: 29 mmol/L (ref 22–32)
Calcium: 8.9 mg/dL (ref 8.9–10.3)
Chloride: 99 mmol/L (ref 98–111)
Creatinine, Ser: 1.38 mg/dL — ABNORMAL HIGH (ref 0.61–1.24)
GFR calc Af Amer: >60 mL/min (ref >60)
GFR calc non Af Amer: 56 mL/min — ABNORMAL LOW (ref >60)
Glucose, Bld: 181 mg/dL — ABNORMAL HIGH (ref 70–99)
Potassium: 3.9 mmol/L (ref 3.5–5.1)
Sodium: 139 mmol/L (ref 135–145)

## 2020-01-02 LAB — DIGOXIN LEVEL: Digoxin Level: 0.4 ng/mL — ABNORMAL LOW (ref 0.8–2.0)

## 2020-01-02 MED ORDER — HYDRALAZINE HCL 50 MG PO TABS: 50.0000 mg | ORAL_TABLET | Freq: Three times a day (TID) | ORAL | 3 refills | Status: DC

## 2020-01-02 MED ORDER — FENOFIBRATE 145 MG PO TABS: 145.0000 mg | ORAL_TABLET | Freq: Every day | ORAL | 11 refills | Status: DC

## 2020-01-02 NOTE — Patient Instructions (Signed)
INCREASE Hydralazine to 50 mg, three times daily START Fenofibrate 145 mg, one tab daily   You have been ordered a PYP Scan.  This is done in the Radiology Department of [facility].  When you come for this test please plan to be there 2-3 hours.  Your physician recommends that you schedule a follow-up appointment in: 3 months  in the Advanced Practitioners (PA/NP) Clinic    Do the following things EVERYDAY: 1) Weigh yourself in the morning before breakfast. Write it down and keep it in a log. 2) Take your medicines as prescribed 3) Eat low salt foods--Limit salt (sodium) to 2000 mg per day.  4) Stay as active as you can everyday 5) Limit all fluids for the day to less than 2 liters  At the [facility], you and your health needs are our priority. As part of our continuing mission to provide you with exceptional heart care, we have created designated Provider Care Teams. These Care Teams include your primary Cardiologist (physician) and Advanced Practice Providers (APPs- Physician Assistants and Nurse Practitioners) who all work together to provide you with the care you need, when you need it.   You may see any of the following providers on your designated Care Team at your next follow up: Marland Kitchen Dr Arvilla Meres . Dr Marca Ancona . Tonye Becket, NP . Robbie Lis, PA . Karle Plumber, PharmD   Please be sure to bring in all your medications bottles to every appointment.

## 2020-01-02 NOTE — Progress Notes (Signed)
Paramedicine Encounter   Patient ID: Richard [name] , male,   DOB: 07-15-1960,58 y.o.,  MRN: [id number]   Richard [name] was seen a the HF clinic today with Dr Shirlee Latch. Per Dr Shirlee Latch, is to increase hydralazine to 50 mg TID and prescribed fenofibrate for elevated triglycerides. No other changes were noted at this time. I will follow up next week.  [name], EMT 01/02/2020   ACTION: Next visit planned for Wednesday

## 2020-01-04 NOTE — Progress Notes (Signed)
Advanced Heart Failure Clinic Note   PCP: Ananias Pilgrim, MD Cardiologist: Dr Shirlee Latch  HPI: Richard [name] is a 59 y.o. male with a history of chronic systolic HF s/p Medtronic ICD (12/2016), HTN, DM, and HLD.   Admitted 5/8-5/14/19 with A/C systolic HF. Echo showed EF 15-20%. Advanced HF team was consulted. He diuresed 20 lbs with IV lasix, then transitioned to torsemide 40 mg daily. Underwent R/LHC showing a focal severe RCA stenosis treated with DES.  He did not, however, appear to have severe enough coronary disease to explain his cardiomyopathy. Cardiac index was low at 1.9.  HF meds were optimized. Beta blocker was not started due to low CI and soft BPs. He was discharged on home oxygen. Referred to cardiac rehab. DC weight: 260 lbs.   Echo in 2/20 showed EF 20-25% with severe LV dilation, moderate RV enlargement with moderately decreased RV systolic function.  CPX in 2/21 showed a severe functional limitation but it actually appeared to be primarily due to pulmonary issues.   He presents today for followup of CHF.  Weight is down 11 lbs since last appointment.  Walks a mile on most days, no dyspnea walking on flat ground.  No orthopnea/PND.  No chest pain.  No lightheadedness.  He is off bisoprolol as it made him short of breath.  He stopped Vascepa due to blood in his stool, this resolved after stopping Vascepak  Labs (10/19): K 3.9, creatinine 1.18  Labs (10/20): BNP 160, K 3.8, creatinine 1.31 Labs (4/21): TGs 475, LDL 67, K 4.2, creatinine 1.8, urine and serum immunofixations negative.   ECG (personally reviewed): NSR, IVCD 132 msec  PMH: 1. Chronic systolic CHF: Primarily nonischemic cardiomyopathy.  Has Medtronic ICD.   - Echo 5/19: EF 15-20%, grade 2 DD, mild MR, RV severely reduced, LA moderately dilated, RA mildly dilated, mild TR.  - RHC/LHC (5/19): 80% distal LAD stenosis, 80% proximal RCA stenosis treated with DES to pRCA. Mean RA 13, PA 69/31 mean 47, mean 24, CI 1.91, PVR  4.8 WU.  - Gynecomastia with spironolactone. - Echo (2/20): EF 20-25%, severe LV enlargement, moderate RV enlargement, moderately decreased RV systolic function.  - CPX (2/21): peak VO2 13.2, VE/VCO2 slope 24, RER 1.10. Severe functional limitation due to OHS with severe restrictive lung disease without significant HF limitation.  2. COPD: Uses oxygen at night. Never smoked, but apparently had significant occupational exposure. It appears that he won a lawsuit dealing with the occupational exposure-related COPD.  3. CAD: Cath in 5/17 with 60% RCA stenosis.  - LHC (5/19): 80% distal LAD, 80% proximal RCA.  DES to proximal RCA.  4. Polycythemia: Probably due to chronic hypoxemia.  5. Type II diabetes.  6. HTN 7. GERD 8. CKD: Stage 3.  Likely due to diabetes and HTN.  9. OSA: Unable to tolerate CPAP.   Review of systems complete and found to be negative unless listed in HPI.    Current Outpatient Medications  Medication Sig Dispense Refill  . acetaminophen (TYLENOL) 650 MG CR tablet Take 650 mg by mouth every 8 (eight) hours as needed for pain.    Marland Kitchen albuterol (PROVENTIL) (2.5 MG/3ML) 0.083% nebulizer solution Inhale into the lungs.    Marland Kitchen allopurinol (ZYLOPRIM) 100 MG tablet Take 1 tablet (100 mg total) by mouth daily. 30 tablet 3  . aspirin 81 MG chewable tablet Chew 81 mg by mouth daily.    Marland Kitchen atorvastatin (LIPITOR) 40 MG tablet TAKE 1 TABLET(40 MG) BY MOUTH DAILY AT  6 PM 90 tablet 3  . bisacodyl (DULCOLAX) 5 MG EC tablet Take 5 mg by mouth daily as needed for moderate constipation.    Marland Kitchen BREO ELLIPTA 100-25 MCG/INH AEPB Inhale 1 puff into the lungs daily.  5  . digoxin (LANOXIN) 0.125 MG tablet TAKE 1 TABLET(0.125 MG) BY MOUTH DAILY 30 tablet 6  . diphenhydrAMINE (BENADRYL) 25 MG tablet Take 25 mg by mouth at bedtime as needed.    . DULoxetine (CYMBALTA) 60 MG capsule Take 60 mg by mouth daily.  2  . eplerenone (INSPRA) 50 MG tablet Take 1 tablet (50 mg total) by mouth daily. 30 tablet 5  .  hydrALAZINE (APRESOLINE) 50 MG tablet Take 1 tablet (50 mg total) by mouth 3 (three) times daily. 90 tablet 3  . Insulin Pen Needle (NOVOFINE) 30G X 8 MM MISC Inject 10 each into the skin as needed. 100 each 0  . insulin regular human CONCENTRATED (HUMULIN R U-500 KWIKPEN) 500 UNIT/ML kwikpen USE AS DIRECTED PER SLIDING SCALE. MAX DAILY DOSE IS 400 UNITS.    Marland Kitchen isosorbide dinitrate (ISORDIL) 20 MG tablet Take 1 tablet (20 mg total) by mouth 3 (three) times daily. 90 tablet 11  . JARDIANCE 25 MG TABS tablet Take 25 mg by mouth daily. 30 tablet 6  . MAGNESIUM-OXIDE 400 (241.3 Mg) MG tablet Take 1 tablet (400 mg total) by mouth daily. 30 tablet 11  . omeprazole (PRILOSEC) 20 MG capsule Take 1 capsule (20 mg total) by mouth daily. 30 capsule 1  . pregabalin (LYRICA) 100 MG capsule Take 1 capsule (100 mg total) by mouth 2 (two) times daily. 60 capsule 3  . sacubitril-valsartan (ENTRESTO) 97-103 MG Take 1 tablet by mouth 2 (two) times daily. 180 tablet 3  . sennosides-docusate sodium (SENOKOT-S) 8.6-50 MG tablet Take 1 tablet by mouth daily.    Marland Kitchen torsemide (DEMADEX) 20 MG tablet Take 2 tablets (40 mg total) by mouth daily. 180 tablet 3  . Vitamin D, Cholecalciferol, 1000 units CAPS Take 1,000 Units by mouth daily.  11  . fenofibrate (TRICOR) 145 MG tablet Take 1 tablet (145 mg total) by mouth daily. 30 tablet 11  . ranitidine (ZANTAC) 150 MG tablet Take 150 mg by mouth 2 (two) times daily. (Patient not taking: Reported on 12/17/2019)     No current facility-administered medications for this encounter.   Allergies  Allergen Reactions  . Codeine   . Coconut Oil Itching    Social History   Socioeconomic History  . Marital status: Divorced    Spouse name: Not on file  . Number of children: Not on file  . Years of education: Not on file  . Highest education level: Not on file  Occupational History  . Occupation: disability    Comment: stopped working in 2000 d/t occupational exposures  Tobacco  Use  . Smoking status: Former Smoker    Types: Cigarettes  . Smokeless tobacco: Never Used  . Tobacco comment: "smoked when I drank"  Vaping Use  . Vaping Use: Never used  Substance and Sexual Activity  . 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 Drug use: Never  . Sexual activity: Not Currently  Other Topics Concern  . Not on file  Social History Narrative   Patient lives at home with wife and some of his 10 children. He self-administers his own medications.   Social Determinants of Health   Financial Resource Strain:   .  Difficulty of Paying Living Expenses:   Food Insecurity:   . Worried About Programme researcher, broadcasting/film/video in the Last Year:   . Barista in the Last Year:   Transportation Needs:   . Freight forwarder (Medical):   Marland Kitchen Lack of Transportation (Non-Medical):   Physical Activity:   . Days of Exercise per Week:   . Minutes of Exercise per Session:   Stress:   . Feeling of Stress :   Social Connections:   . Frequency of Communication with Friends and Family:   . Frequency of Social Gatherings with Friends and Family:   . Attends Religious Services:   . Active Member of Clubs or Organizations:   . Attends Banker Meetings:   Marland Kitchen Marital Status:   Intimate Partner Violence:   . Fear of Current or Ex-Partner:   . Emotionally Abused:   Marland Kitchen Physically Abused:   . Sexually Abused:    Family History  Problem Relation Age of Onset  . Hypertension Mother   . Diabetes Mother   . Hypertension Father   . Diabetes Father   . Diabetes Sister   . Diabetes Brother    Vitals:   01/02/20 1442  BP: 124/88  Pulse: (!) 42  SpO2: 93%  Weight: 117 kg (258 lb)     Wt Readings from Last 3 Encounters:  01/02/20 117 kg (258 lb)  12/31/19 116.1 kg (256 lb)  12/24/19 115.6 kg (254 lb 14.4 oz)   PHYSICAL EXAM: General: NAD Neck: No JVD, no thyromegaly or thyroid nodule.  Lungs: Clear to auscultation  bilaterally with normal respiratory effort. CV: Nondisplaced PMI.  Heart regular S1/S2, no S3/S4, no murmur.  No peripheral edema.  No carotid bruit.  Normal pedal pulses.  Abdomen: Soft, nontender, no hepatosplenomegaly, no distention.  Skin: Intact without lesions or rashes.  Neurologic: Alert and oriented x 3.  Psych: Normal affect. Extremities: No clubbing or cyanosis.  HEENT: Normal.   ASSESSMENT & PLAN:  1. Chronic systolic CHF:  Primarily nonischemic cardiomyopathy. Echo in 2017 with EF 15-20%. Medtronic ICD. Echo 10/2017 EF 15-20%, severely dilated RV with severely decreased systolic function. LHC/RHC 11/06/17 showed volume overload with 80% RCA stenosis.  Cardiac index low at 1.91. The degree of coronary disease does not explain his cardiomyopathy.  Echo in 2/20 showed severe LV dilation, EF 20-25%, moderately decreased RV systolic function.  NYHA class II symptoms, he is not volume overloaded by exam.  - Continue torsemide 40 mg daily.  BMET today.  - Continue entresto 97/103 mg BID.  - Continue eplerenone 50 mg daily.   - Increase hydralazine to 50 mg tid and continue isordil 20 mg tid.  - Continue digoxin 0.125 mg daily, check level today.  - Unable to tolerate bisoprolol due to dyspnea.  - Continue empagliflozin.  - He has HF and severe peripheral neuropathy.  Neuropathy is likely due to diabetes, but I think it would be reasonable to rule out evidence for amyloidosis. Myeloma panel and urine IFXN negative, awaiting PYP scan.  2. COPD: Never smoked, but apparently had significant occupational exposure. It appears that he won a lawsuit dealing with the occupational exposure-related COPD. CPX in 2/21 showed severe functional limitation, but it appeared to be due to pulmonary abnormalities rather than HF.  - Followup with pulmonary.  3. CAD:  LHC in 5/19 with 80-90% proximal RCA stenosis treated with DES to RCA.  This did not cause his cardiomyopathy but is a large  RCA.  No chest  pain.  - Continue ASA 81 daily.  - Continue atorvastatin, good LDL in 4/21.  4. Polycythemia: Likely related to chronic hypoxia.  5. OSA: Cannot tolerate CPAP, wears oxygen at night.  6. DM: He is on empagliflozin.   7. Hypertriglyceridemia: Off Vascepa due to blood in stool that resolved.  - Start fenofibrate 145 mg daily.  - Would tell PCP about blood in stool, may benefit from colonoscopy.   Continue paramedicine.  Followup 3 months with NP/PA.   Marca Ancona 01/04/2020

## 2020-01-06 ENCOUNTER — Telehealth (HOSPITAL_COMMUNITY): Payer: Self-pay | Admitting: Vascular Surgery

## 2020-01-06 NOTE — Telephone Encounter (Signed)
Left pt message giving pt PYP scan appt date and time, asked pt  to call back to confirm

## 2020-01-07 ENCOUNTER — Other Ambulatory Visit (HOSPITAL_COMMUNITY): Payer: Self-pay

## 2020-01-07 NOTE — Progress Notes (Signed)
Paramedicine Encounter    Patient ID: Richard [name], male    DOB: 02/08/1961, 59 y.o.   MRN: [id number]   Patient Care Team: Ananias Pilgrim, MD as PCP - General (Family Medicine) Laurey Morale, MD as PCP - Advanced Heart Failure (Cardiology) Burna Sis, LCSW as Social Worker (Licensed Clinical Social Worker)  Patient Active Problem List   Diagnosis Date Noted  . Acute on chronic combined systolic and diastolic CHF (congestive heart failure) (HCC) 03/15/2018  . S/P right and left heart catheterization 11/02/2017  . Panic attacks 11/02/2017  . Non-STEMI (non-ST elevated myocardial infarction) (HCC)   . COPD (chronic obstructive pulmonary disease) (HCC)   . Hypertension   . CKD (chronic kidney disease)   . 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HCC)   . ICD (implantable cardioverter-defibrillator) in place   . GERD (gastroesophageal reflux disease)   . Diabetes mellitus with diabetic neuropathy, with long-term current use of insulin (HCC)     Current Outpatient Medications:  .  acetaminophen (TYLENOL) 650 MG CR tablet, Take 650 mg by mouth every 8 (eight) hours as needed for pain., Disp: , Rfl:  .  albuterol (PROVENTIL) (2.5 MG/3ML) 0.083% nebulizer solution, Inhale into the lungs., Disp: , Rfl:  .  allopurinol (ZYLOPRIM) 100 MG tablet, Take 1 tablet (100 mg total) by mouth daily., Disp: 30 tablet, Rfl: 3 .  aspirin 81 MG chewable tablet, Chew 81 mg by mouth daily., Disp: , Rfl:  .  atorvastatin (LIPITOR) 40 MG tablet, TAKE 1 TABLET(40 MG) BY MOUTH DAILY AT 6 PM, Disp: 90 tablet, Rfl: 3 .  bisacodyl (DULCOLAX) 5 MG EC tablet, Take 5 mg by mouth daily as needed for moderate constipation., Disp: , Rfl:  .  BREO ELLIPTA 100-25 MCG/INH AEPB, Inhale 1 puff into the lungs daily., Disp: , Rfl: 5 .  digoxin (LANOXIN) 0.125 MG tablet, TAKE 1 TABLET(0.125 MG) BY MOUTH DAILY, Disp: 30 tablet, Rfl: 6 .  diphenhydrAMINE (BENADRYL) 25 MG tablet, Take 25 mg by mouth at bedtime as needed., Disp: , Rfl:  .   DULoxetine (CYMBALTA) 60 MG capsule, Take 60 mg by mouth daily., Disp: , Rfl: 2 .  eplerenone (INSPRA) 50 MG tablet, Take 1 tablet (50 mg total) by mouth daily., Disp: 30 tablet, Rfl: 5 .  fenofibrate (TRICOR) 145 MG tablet, Take 1 tablet (145 mg total) by mouth daily., Disp: 30 tablet, Rfl: 11 .  hydrALAZINE (APRESOLINE) 50 MG tablet, Take 1 tablet (50 mg total) by mouth 3 (three) times daily., Disp: 90 tablet, Rfl: 3 .  Insulin Pen Needle (NOVOFINE) 30G X 8 MM MISC, Inject 10 each into the skin as needed., Disp: 100 each, Rfl: 0 .  insulin regular human CONCENTRATED (HUMULIN R U-500 KWIKPEN) 500 UNIT/ML kwikpen, USE AS DIRECTED PER SLIDING SCALE. MAX DAILY DOSE IS 400 UNITS., Disp: , Rfl:  .  isosorbide dinitrate (ISORDIL) 20 MG tablet, Take 1 tablet (20 mg total) by mouth 3 (three) times daily., Disp: 90 tablet, Rfl: 11 .  JARDIANCE 25 MG TABS tablet, Take 25 mg by mouth daily., Disp: 30 tablet, Rfl: 6 .  MAGNESIUM-OXIDE 400 (241.3 Mg) MG tablet, Take 1 tablet (400 mg total) by mouth daily., Disp: 30 tablet, Rfl: 11 .  omeprazole (PRILOSEC) 20 MG capsule, Take 1 capsule (20 mg total) by mouth daily., Disp: 30 capsule, Rfl: 1 .  pregabalin (LYRICA) 100 MG capsule, Take 1 capsule (100 mg total) by mouth 2 (two) times daily., Disp: 60 capsule, Rfl: 3 .  ranitidine (ZANTAC) 150 MG tablet, Take 150 mg by mouth 2 (two) times daily. (Patient not taking: Reported on 12/17/2019), Disp: , Rfl:  .  sacubitril-valsartan (ENTRESTO) 97-103 MG, Take 1 tablet by mouth 2 (two) times daily., Disp: 180 tablet, Rfl: 3 .  sennosides-docusate sodium (SENOKOT-S) 8.6-50 MG tablet, Take 1 tablet by mouth daily., Disp: , Rfl:  .  torsemide (DEMADEX) 20 MG tablet, Take 2 tablets (40 mg total) by mouth daily., Disp: 180 tablet, Rfl: 3 .  Vitamin D, Cholecalciferol, 1000 units CAPS, Take 1,000 Units by mouth daily., Disp: , Rfl: 11 Allergies  Allergen Reactions  . Codeine   . Coconut Oil Itching      Social History    Socioeconomic History  . Marital status: Divorced    Spouse name: Not on file  . Number of children: Not on file  . Years of education: Not on file  . Highest education level: Not on file  Occupational History  . Occupation: disability    Comment: stopped working in 2000 d/t occupational exposures  Tobacco Use  . Smoking status: Former Smoker    Types: Cigarettes  . Smokeless tobacco: Never Used  . Tobacco comment: "smoked when I drank"  Vaping Use  . Vaping Use: Never used  Substance and Sexual Activity  . 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 Drug use: Never  . Sexual activity: Not Currently  Other Topics Concern  . Not on file  Social History Narrative   Patient lives at home with wife and some of his 10 children. He self-administers his own medications.   Social Determinants of Health   Financial Resource Strain:   . Difficulty of Paying Living Expenses:   Food Insecurity:   . Worried About Programme researcher, broadcasting/film/video in the Last Year:   . Barista in the Last Year:   Transportation Needs:   . Freight forwarder (Medical):   Marland Kitchen Lack of Transportation (Non-Medical):   Physical Activity:   . Days of Exercise per Week:   . Minutes of Exercise per Session:   Stress:   . Feeling of Stress :   Social Connections:   . Frequency of Communication with Friends and Family:   . Frequency of Social Gatherings with Friends and Family:   . Attends Religious Services:   . Active Member of Clubs or Organizations:   . Attends Banker Meetings:   Marland Kitchen Marital Status:   Intimate Partner Violence:   . Fear of Current or Ex-Partner:   . Emotionally Abused:   Marland Kitchen Physically Abused:   . Sexually Abused:     Physical Exam Cardiovascular:     Rate and Rhythm: Normal rate and regular rhythm.     Pulses: Normal pulses.  Pulmonary:     Effort: Pulmonary effort is normal.     Breath sounds: Normal breath  sounds.  Musculoskeletal:        General: Normal range of motion.     Right lower leg: No edema.     Left lower leg: No edema.  Skin:    General: Skin is warm and dry.     Capillary Refill: Capillary refill takes less than 2 seconds.  Neurological:     Mental Status: He is alert and oriented to person, place, and time.  Psychiatric:        Mood and Affect: Mood normal.  Future Appointments  Date Time Provider Department Center  01/16/2020 11:00 AM [facility] 1 [facility]  01/16/2020  1:00 PM [facility] 1 [facility]  03/02/2020  8:10 AM [facility] DEVICE REMOTES [facility]  04/05/2020  2:30 PM [facility] PA/NP [facility] None  06/01/2020  8:10 AM [facility] DEVICE REMOTES [facility]    BP 114/80 (BP Location: Left Arm, Patient Position: Sitting, Cuff Size: Normal)   Pulse 86   Resp 16   Wt 258 lb (117 kg)   SpO2 92%   BMI 30.59 kg/m   Weight yesterday- 258 lb Last visit weight- 258 lb  Mr Sanker was seen at home today and reported feeling well. He denied chest pain, SOB, headache, dizziness, orthopnea, fever or cough over the past week. He stated he has been compliant with his medications over the past week and his weight has been stable. His medications were verified and his pillbox was refilled. I will follow up next week.   [name], EMT 01/07/20  ACTION: Home visit completed Next visit planned for 1 week

## 2020-01-14 ENCOUNTER — Other Ambulatory Visit (HOSPITAL_COMMUNITY): Payer: Self-pay

## 2020-01-14 ENCOUNTER — Other Ambulatory Visit (HOSPITAL_COMMUNITY): Payer: Self-pay | Admitting: *Deleted

## 2020-01-14 MED ORDER — EPLERENONE 50 MG PO TABS: 50.0000 mg | ORAL_TABLET | Freq: Every day | ORAL | 6 refills | Status: DC

## 2020-01-14 NOTE — Progress Notes (Signed)
Paramedicine Encounter    Patient ID: Richard [name], male    DOB: 1961-01-22, 59 y.o.   MRN: [id number]   Patient Care Team: Ananias Pilgrim, MD as PCP - General (Family Medicine) Laurey Morale, MD as PCP - Advanced Heart Failure (Cardiology) Burna Sis, LCSW as Social Worker (Licensed Clinical Social Worker)  Patient Active Problem List   Diagnosis Date Noted   Acute on chronic combined systolic and diastolic CHF (congestive heart failure) (HCC) 03/15/2018   S/P right and left heart catheterization 11/02/2017   Panic attacks 11/02/2017   Non-STEMI (non-ST elevated myocardial infarction) (HCC)    COPD (chronic obstructive pulmonary disease) (HCC)    Hypertension    CKD (chronic kidney disease)    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HCC)    ICD (implantable cardioverter-defibrillator) in place    GERD (gastroesophageal reflux disease)    Diabetes mellitus with diabetic neuropathy, with long-term current use of insulin (HCC)     Current Outpatient Medications:    acetaminophen (TYLENOL) 650 MG CR tablet, Take 650 mg by mouth every 8 (eight) hours as needed for pain., Disp: , Rfl:    albuterol (PROVENTIL) (2.5 MG/3ML) 0.083% nebulizer solution, Inhale into the lungs., Disp: , Rfl:    allopurinol (ZYLOPRIM) 100 MG tablet, Take 1 tablet (100 mg total) by mouth daily., Disp: 30 tablet, Rfl: 3   aspirin 81 MG chewable tablet, Chew 81 mg by mouth daily., Disp: , Rfl:    atorvastatin (LIPITOR) 40 MG tablet, TAKE 1 TABLET(40 MG) BY MOUTH DAILY AT 6 PM, Disp: 90 tablet, Rfl: 3   bisacodyl (DULCOLAX) 5 MG EC tablet, Take 5 mg by mouth daily as needed for moderate constipation., Disp: , Rfl:    BREO ELLIPTA 100-25 MCG/INH AEPB, Inhale 1 puff into the lungs daily., Disp: , Rfl: 5   digoxin (LANOXIN) 0.125 MG tablet, TAKE 1 TABLET(0.125 MG) BY MOUTH DAILY, Disp: 30 tablet, Rfl: 6   diphenhydrAMINE (BENADRYL) 25 MG tablet, Take 25 mg by mouth at bedtime as needed., Disp: , Rfl:     DULoxetine (CYMBALTA) 60 MG capsule, Take 60 mg by mouth daily., Disp: , Rfl: 2   eplerenone (INSPRA) 50 MG tablet, Take 1 tablet (50 mg total) by mouth daily., Disp: 30 tablet, Rfl: 5   fenofibrate (TRICOR) 145 MG tablet, Take 1 tablet (145 mg total) by mouth daily., Disp: 30 tablet, Rfl: 11   hydrALAZINE (APRESOLINE) 50 MG tablet, Take 1 tablet (50 mg total) by mouth 3 (three) times daily., Disp: 90 tablet, Rfl: 3   Insulin Pen Needle (NOVOFINE) 30G X 8 MM MISC, Inject 10 each into the skin as needed. (Patient not taking: Reported on 01/07/2020), Disp: 100 each, Rfl: 0   insulin regular human CONCENTRATED (HUMULIN R U-500 KWIKPEN) 500 UNIT/ML kwikpen, USE AS DIRECTED PER SLIDING SCALE. MAX DAILY DOSE IS 400 UNITS., Disp: , Rfl:    isosorbide dinitrate (ISORDIL) 20 MG tablet, Take 1 tablet (20 mg total) by mouth 3 (three) times daily., Disp: 90 tablet, Rfl: 11   JARDIANCE 25 MG TABS tablet, Take 25 mg by mouth daily., Disp: 30 tablet, Rfl: 6   MAGNESIUM-OXIDE 400 (241.3 Mg) MG tablet, Take 1 tablet (400 mg total) by mouth daily., Disp: 30 tablet, Rfl: 11   omeprazole (PRILOSEC) 20 MG capsule, Take 1 capsule (20 mg total) by mouth daily., Disp: 30 capsule, Rfl: 1   pregabalin (LYRICA) 100 MG capsule, Take 1 capsule (100 mg total) by mouth 2 (two) times daily.,  Disp: 60 capsule, Rfl: 3   ranitidine (ZANTAC) 150 MG tablet, Take 150 mg by mouth 2 (two) times daily. (Patient not taking: Reported on 12/17/2019), Disp: , Rfl:    sacubitril-valsartan (ENTRESTO) 97-103 MG, Take 1 tablet by mouth 2 (two) times daily., Disp: 180 tablet, Rfl: 3   sennosides-docusate sodium (SENOKOT-S) 8.6-50 MG tablet, Take 1 tablet by mouth daily., Disp: , Rfl:    torsemide (DEMADEX) 20 MG tablet, Take 2 tablets (40 mg total) by mouth daily., Disp: 180 tablet, Rfl: 3   Vitamin D, Cholecalciferol, 1000 units CAPS, Take 1,000 Units by mouth daily. (Patient not taking: Reported on 01/07/2020), Disp: , Rfl:  11 Allergies  Allergen Reactions   Codeine    Coconut Oil Itching      Social History   Socioeconomic History   Marital status: Divorced    Spouse name: Not on file   Number of children: Not on file   Years of education: Not on file   Highest education level: Not on file  Occupational History   Occupation: disability    Comment: stopped working in 2000 d/t occupational exposures  Tobacco Use   Smoking status: Former Smoker    Types: Cigarettes   Smokeless tobacco: Never Used   Tobacco comment: "smoked when I drank"  Vaping Use   Vaping Use: Never used  Substance and Sexual Activity   Alcohol use: Not Currently    Comment: 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  Other Topics Concern   Not on file  Social History Narrative   Patient lives at home with wife and some of his 10 children. He self-administers his own medications.   Social Determinants of Health   Financial Resource Strain:    Difficulty of Paying Living Expenses:   Food Insecurity:    Worried About Programme researcher, broadcasting/film/video in the Last Year:    Barista in the Last Year:   Transportation Needs:    Freight forwarder (Medical):    Lack of Transportation (Non-Medical):   Physical Activity:    Days of Exercise per Week:    Minutes of Exercise per Session:   Stress:    Feeling of Stress :   Social Connections:    Frequency of Communication with Friends and Family:    Frequency of Social Gatherings with Friends and Family:    Attends Religious Services:    Active Member of Clubs or Organizations:    Attends Banker Meetings:    Marital Status:   Intimate Partner Violence:    Fear of Current or Ex-Partner:    Emotionally Abused:    Physically Abused:    Sexually Abused:     Physical Exam Cardiovascular:     Rate and Rhythm: Normal rate and regular rhythm.     Pulses: Normal  pulses.  Pulmonary:     Effort: Pulmonary effort is normal.     Breath sounds: Normal breath sounds.  Musculoskeletal:        General: Normal range of motion.     Right lower leg: No edema.     Left lower leg: No edema.  Skin:    General: Skin is warm and dry.     Capillary Refill: Capillary refill takes less than 2 seconds.  Neurological:     Mental Status: He is alert and oriented to person, place, and time.  Psychiatric:  Mood and Affect: Mood normal.         Future Appointments  Date Time Provider Department Center  01/23/2020 10:00 AM [facility] INJ 1 [facility]  01/23/2020 11:00 AM [facility] 1 [facility]  03/02/2020  8:10 AM [facility] DEVICE REMOTES [facility]  04/05/2020  2:30 PM [facility] PA/NP [facility] None  06/01/2020  8:10 AM [facility] DEVICE REMOTES [facility]    BP 98/61 (BP Location: Left Arm, Patient Position: Sitting, Cuff Size: Large)    Pulse 80    Resp 18    Wt 259 lb (117.5 kg)    SpO2 92%    BMI 30.71 kg/m   Weight yesterday- did not weigh Last visit weight- 258 lb  Mr Stahnke was seen at home today and reported feeling well. He denied chest pain, SOB, headache, dizziness, orthopnea, fever or cough. He stated he has been compliant with his medications over the past week and his weight has been stable. He had not picked up medication from the pharmacy but stated he would get it tomorrow. His medications were verified and his pillbox was refilled. I advised him where he should place Jardiance and eplerenone upon picking it up and he expressed understanding. There are an additional 3 medications he needs to pick up next week however they do not need to be added to his box. I will follow up next week.   [name], EMT 01/14/20  ACTION: Home visit completed Next visit planned for 1 week

## 2020-01-16 ENCOUNTER — Encounter (HOSPITAL_COMMUNITY): Payer: Medicare Other

## 2020-01-21 ENCOUNTER — Telehealth (HOSPITAL_COMMUNITY): Payer: Self-pay

## 2020-01-21 NOTE — Telephone Encounter (Signed)
I called Richard [name] to see if he was available for a visit. He stated he was about to leave to run errands with his wife so I advised I could meet him tomorrow.He was agreeable to this and we will meet in the afternoon.   [name], EMT 01/21/20

## 2020-01-22 ENCOUNTER — Other Ambulatory Visit (HOSPITAL_COMMUNITY): Payer: Self-pay

## 2020-01-22 NOTE — Progress Notes (Signed)
Paramedicine Encounter    Patient ID: Richard [name], male    DOB: 1960-07-07, 59 y.o.   MRN: [id number]   Patient Care Team: Ananias Pilgrim, MD as PCP - General (Family Medicine) Laurey Morale, MD as PCP - Advanced Heart Failure (Cardiology) Burna Sis, LCSW as Social Worker (Licensed Clinical Social Worker)  Patient Active Problem List   Diagnosis Date Noted  . Acute on chronic combined systolic and diastolic CHF (congestive heart failure) (HCC) 03/15/2018  . S/P right and left heart catheterization 11/02/2017  . Panic attacks 11/02/2017  . Non-STEMI (non-ST elevated myocardial infarction) (HCC)   . COPD (chronic obstructive pulmonary disease) (HCC)   . Hypertension   . CKD (chronic kidney disease)   . 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HCC)   . ICD (implantable cardioverter-defibrillator) in place   . GERD (gastroesophageal reflux disease)   . Diabetes mellitus with diabetic neuropathy, with long-term current use of insulin (HCC)     Current Outpatient Medications:  .  acetaminophen (TYLENOL) 650 MG CR tablet, Take 650 mg by mouth every 8 (eight) hours as needed for pain., Disp: , Rfl:  .  albuterol (PROVENTIL) (2.5 MG/3ML) 0.083% nebulizer solution, Inhale into the lungs., Disp: , Rfl:  .  allopurinol (ZYLOPRIM) 100 MG tablet, Take 1 tablet (100 mg total) by mouth daily., Disp: 30 tablet, Rfl: 3 .  aspirin 81 MG chewable tablet, Chew 81 mg by mouth daily., Disp: , Rfl:  .  atorvastatin (LIPITOR) 40 MG tablet, TAKE 1 TABLET(40 MG) BY MOUTH DAILY AT 6 PM, Disp: 90 tablet, Rfl: 3 .  bisacodyl (DULCOLAX) 5 MG EC tablet, Take 5 mg by mouth daily as needed for moderate constipation., Disp: , Rfl:  .  digoxin (LANOXIN) 0.125 MG tablet, TAKE 1 TABLET(0.125 MG) BY MOUTH DAILY, Disp: 30 tablet, Rfl: 6 .  diphenhydrAMINE (BENADRYL) 25 MG tablet, Take 25 mg by mouth at bedtime as needed., Disp: , Rfl:  .  DULoxetine (CYMBALTA) 60 MG capsule, Take 60 mg by mouth daily., Disp: , Rfl: 2 .   eplerenone (INSPRA) 50 MG tablet, Take 1 tablet (50 mg total) by mouth daily., Disp: 30 tablet, Rfl: 6 .  fenofibrate (TRICOR) 145 MG tablet, Take 1 tablet (145 mg total) by mouth daily., Disp: 30 tablet, Rfl: 11 .  hydrALAZINE (APRESOLINE) 50 MG tablet, Take 1 tablet (50 mg total) by mouth 3 (three) times daily., Disp: 90 tablet, Rfl: 3 .  insulin regular human CONCENTRATED (HUMULIN R U-500 KWIKPEN) 500 UNIT/ML kwikpen, USE AS DIRECTED PER SLIDING SCALE. MAX DAILY DOSE IS 400 UNITS., Disp: , Rfl:  .  isosorbide dinitrate (ISORDIL) 20 MG tablet, Take 1 tablet (20 mg total) by mouth 3 (three) times daily., Disp: 90 tablet, Rfl: 11 .  JARDIANCE 25 MG TABS tablet, Take 25 mg by mouth daily., Disp: 30 tablet, Rfl: 6 .  MAGNESIUM-OXIDE 400 (241.3 Mg) MG tablet, Take 1 tablet (400 mg total) by mouth daily., Disp: 30 tablet, Rfl: 11 .  omeprazole (PRILOSEC) 20 MG capsule, Take 1 capsule (20 mg total) by mouth daily., Disp: 30 capsule, Rfl: 1 .  pregabalin (LYRICA) 100 MG capsule, Take 1 capsule (100 mg total) by mouth 2 (two) times daily., Disp: 60 capsule, Rfl: 3 .  sacubitril-valsartan (ENTRESTO) 97-103 MG, Take 1 tablet by mouth 2 (two) times daily., Disp: 180 tablet, Rfl: 3 .  sennosides-docusate sodium (SENOKOT-S) 8.6-50 MG tablet, Take 1 tablet by mouth daily., Disp: , Rfl:  .  torsemide (DEMADEX) 20  MG tablet, Take 2 tablets (40 mg total) by mouth daily., Disp: 180 tablet, Rfl: 3 .  BREO ELLIPTA 100-25 MCG/INH AEPB, Inhale 1 puff into the lungs daily. (Patient not taking: Reported on 01/14/2020), Disp: , Rfl: 5 .  Insulin Pen Needle (NOVOFINE) 30G X 8 MM MISC, Inject 10 each into the skin as needed. (Patient not taking: Reported on 01/07/2020), Disp: 100 each, Rfl: 0 .  ranitidine (ZANTAC) 150 MG tablet, Take 150 mg by mouth 2 (two) times daily. (Patient not taking: Reported on 12/17/2019), Disp: , Rfl:  .  Vitamin D, Cholecalciferol, 1000 units CAPS, Take 1,000 Units by mouth daily. (Patient not taking:  Reported on 01/07/2020), Disp: , Rfl: 11 Allergies  Allergen Reactions  . Codeine   . Coconut Oil Itching      Social History   Socioeconomic History  . Marital status: Divorced    Spouse name: Not on file  . Number of children: Not on file  . Years of education: Not on file  . Highest education level: Not on file  Occupational History  . Occupation: disability    Comment: stopped working in 2000 d/t occupational exposures  Tobacco Use  . Smoking status: Former Smoker    Types: Cigarettes  . Smokeless tobacco: Never Used  . Tobacco comment: "smoked when I drank"  Vaping Use  . Vaping Use: Never used  Substance and Sexual Activity  . 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 Drug use: Never  . Sexual activity: Not Currently  Other Topics Concern  . Not on file  Social History Narrative   Patient lives at home with wife and some of his 10 children. He self-administers his own medications.   Social Determinants of Health   Financial Resource Strain:   . Difficulty of Paying Living Expenses:   Food Insecurity:   . Worried About Programme researcher, broadcasting/film/video in the Last Year:   . Barista in the Last Year:   Transportation Needs:   . Freight forwarder (Medical):   Marland Kitchen Lack of Transportation (Non-Medical):   Physical Activity:   . Days of Exercise per Week:   . Minutes of Exercise per Session:   Stress:   . Feeling of Stress :   Social Connections:   . Frequency of Communication with Friends and Family:   . Frequency of Social Gatherings with Friends and Family:   . Attends Religious Services:   . Active Member of Clubs or Organizations:   . Attends Banker Meetings:   Marland Kitchen Marital Status:   Intimate Partner Violence:   . Fear of Current or Ex-Partner:   . Emotionally Abused:   Marland Kitchen Physically Abused:   . Sexually Abused:     Physical Exam Cardiovascular:     Rate and Rhythm: Normal rate and  regular rhythm.     Pulses: Normal pulses.  Pulmonary:     Effort: Pulmonary effort is normal.     Breath sounds: Normal breath sounds.  Abdominal:     General: There is no distension.  Musculoskeletal:        General: Normal range of motion.     Right lower leg: No edema.     Left lower leg: No edema.  Skin:    General: Skin is warm and dry.     Capillary Refill: Capillary refill takes less than 2 seconds.  Neurological:  Mental Status: He is alert and oriented to person, place, and time.  Psychiatric:        Mood and Affect: Mood normal.         Future Appointments  Date Time Provider Department Center  01/23/2020 10:00 AM [facility] INJ 1 [facility]  01/23/2020 11:00 AM [facility]NM 1 [facility]  03/02/2020  8:10 AM [facility] DEVICE REMOTES [facility]  04/05/2020  2:30 PM [facility] PA/NP [facility] None  06/01/2020  8:10 AM [facility] DEVICE REMOTES [facility]    BP 126/72 (BP Location: Left Arm, Patient Position: Sitting, Cuff Size: Normal)   Pulse 74   Resp 16   Wt (!) 259 lb (117.5 kg)   SpO2 92%   BMI 30.71 kg/m   Weight yesterday- 259 lb Last visit weight- 259 lb  Mr Wulf was seen at home today and reported feeling well. He denied chest pain, SOB, headache, dizziness, orthopnea, fever or cough over the past week. He stated he has been compliant with his medications over the past week despite missing a few afternoon doses of hydralazine and isosorbide. His weight has remained stable over the past week as well. His medications were verified and his pillbox was refilled. I will follow up next week.   [name], EMT 01/22/20  ACTION: Home visit completed Next visit planned for 1 week

## 2020-01-23 ENCOUNTER — Ambulatory Visit (HOSPITAL_COMMUNITY)
Admission: RE | Admit: 2020-01-23 | Discharge: 2020-01-23 | Disposition: A | Discharge status: Home / Self Care | Payer: Medicare Other | Source: Ambulatory Visit | Attending: Cardiology | Admitting: Cardiology

## 2020-01-23 ENCOUNTER — Other Ambulatory Visit: Payer: Self-pay

## 2020-01-23 ENCOUNTER — Encounter (HOSPITAL_COMMUNITY)
Admission: RE | Admit: 2020-01-23 | Discharge: 2020-01-23 | Disposition: A | Discharge status: Home / Self Care | Payer: Medicare Other | Source: Ambulatory Visit | Attending: Cardiology | Admitting: Cardiology

## 2020-01-23 DIAGNOSIS — I5042 Chronic combined systolic (congestive) and diastolic (congestive) heart failure: Secondary | ICD-10-CM | POA: Diagnosis not present

## 2020-01-23 MED ORDER — TECHNETIUM TC 99M PYROPHOSPHATE
22.0000 | Freq: Once | INTRAVENOUS | Status: AC | PRN
  Administered 2020-01-23: 22 via INTRAVENOUS
  Filled 2020-01-23: qty 22

## 2020-01-28 ENCOUNTER — Telehealth (HOSPITAL_COMMUNITY): Payer: Self-pay

## 2020-01-28 NOTE — Telephone Encounter (Signed)
I called Mr Richard [name] to see if he was available for a visit today. He stated he would rather meet tomorrow morning so we agreed to meet at 09:00.   Richard [name], EMT 01/28/20

## 2020-01-29 ENCOUNTER — Other Ambulatory Visit (HOSPITAL_COMMUNITY): Payer: Self-pay

## 2020-01-29 NOTE — Progress Notes (Signed)
Paramedicine Encounter    Patient ID: Richard [name], male    DOB: 22-Mar-1961, 59 y.o.   MRN: [id number]   Patient Care Team: Ananias Pilgrim, MD as PCP - General (Family Medicine) Laurey Morale, MD as PCP - Advanced Heart Failure (Cardiology) Burna Sis, LCSW as Social Worker (Licensed Clinical Social Worker)  Patient Active Problem List   Diagnosis Date Noted  . Acute on chronic combined systolic and diastolic CHF (congestive heart failure) (HCC) 03/15/2018  . S/P right and left heart catheterization 11/02/2017  . Panic attacks 11/02/2017  . Non-STEMI (non-ST elevated myocardial infarction) (HCC)   . COPD (chronic obstructive pulmonary disease) (HCC)   . Hypertension   . CKD (chronic kidney disease)   . 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HCC)   . ICD (implantable cardioverter-defibrillator) in place   . GERD (gastroesophageal reflux disease)   . Diabetes mellitus with diabetic neuropathy, with long-term current use of insulin (HCC)     Current Outpatient Medications:  .  acetaminophen (TYLENOL) 650 MG CR tablet, Take 650 mg by mouth every 8 (eight) hours as needed for pain., Disp: , Rfl:  .  albuterol (PROVENTIL) (2.5 MG/3ML) 0.083% nebulizer solution, Inhale into the lungs., Disp: , Rfl:  .  allopurinol (ZYLOPRIM) 100 MG tablet, Take 1 tablet (100 mg total) by mouth daily., Disp: 30 tablet, Rfl: 3 .  aspirin 81 MG chewable tablet, Chew 81 mg by mouth daily., Disp: , Rfl:  .  atorvastatin (LIPITOR) 40 MG tablet, TAKE 1 TABLET(40 MG) BY MOUTH DAILY AT 6 PM, Disp: 90 tablet, Rfl: 3 .  bisacodyl (DULCOLAX) 5 MG EC tablet, Take 5 mg by mouth daily as needed for moderate constipation., Disp: , Rfl:  .  BREO ELLIPTA 100-25 MCG/INH AEPB, Inhale 1 puff into the lungs daily. (Patient not taking: Reported on 01/14/2020), Disp: , Rfl: 5 .  digoxin (LANOXIN) 0.125 MG tablet, TAKE 1 TABLET(0.125 MG) BY MOUTH DAILY, Disp: 30 tablet, Rfl: 6 .  diphenhydrAMINE (BENADRYL) 25 MG tablet, Take 25 mg by  mouth at bedtime as needed., Disp: , Rfl:  .  DULoxetine (CYMBALTA) 60 MG capsule, Take 60 mg by mouth daily., Disp: , Rfl: 2 .  eplerenone (INSPRA) 50 MG tablet, Take 1 tablet (50 mg total) by mouth daily., Disp: 30 tablet, Rfl: 6 .  fenofibrate (TRICOR) 145 MG tablet, Take 1 tablet (145 mg total) by mouth daily., Disp: 30 tablet, Rfl: 11 .  hydrALAZINE (APRESOLINE) 50 MG tablet, Take 1 tablet (50 mg total) by mouth 3 (three) times daily., Disp: 90 tablet, Rfl: 3 .  Insulin Pen Needle (NOVOFINE) 30G X 8 MM MISC, Inject 10 each into the skin as needed. (Patient not taking: Reported on 01/07/2020), Disp: 100 each, Rfl: 0 .  insulin regular human CONCENTRATED (HUMULIN R U-500 KWIKPEN) 500 UNIT/ML kwikpen, USE AS DIRECTED PER SLIDING SCALE. MAX DAILY DOSE IS 400 UNITS., Disp: , Rfl:  .  isosorbide dinitrate (ISORDIL) 20 MG tablet, Take 1 tablet (20 mg total) by mouth 3 (three) times daily., Disp: 90 tablet, Rfl: 11 .  JARDIANCE 25 MG TABS tablet, Take 25 mg by mouth daily., Disp: 30 tablet, Rfl: 6 .  MAGNESIUM-OXIDE 400 (241.3 Mg) MG tablet, Take 1 tablet (400 mg total) by mouth daily., Disp: 30 tablet, Rfl: 11 .  omeprazole (PRILOSEC) 20 MG capsule, Take 1 capsule (20 mg total) by mouth daily., Disp: 30 capsule, Rfl: 1 .  pregabalin (LYRICA) 100 MG capsule, Take 1 capsule (100 mg total)  by mouth 2 (two) times daily., Disp: 60 capsule, Rfl: 3 .  ranitidine (ZANTAC) 150 MG tablet, Take 150 mg by mouth 2 (two) times daily. (Patient not taking: Reported on 12/17/2019), Disp: , Rfl:  .  sacubitril-valsartan (ENTRESTO) 97-103 MG, Take 1 tablet by mouth 2 (two) times daily., Disp: 180 tablet, Rfl: 3 .  sennosides-docusate sodium (SENOKOT-S) 8.6-50 MG tablet, Take 1 tablet by mouth daily., Disp: , Rfl:  .  torsemide (DEMADEX) 20 MG tablet, Take 2 tablets (40 mg total) by mouth daily., Disp: 180 tablet, Rfl: 3 .  Vitamin D, Cholecalciferol, 1000 units CAPS, Take 1,000 Units by mouth daily. (Patient not taking:  Reported on 01/07/2020), Disp: , Rfl: 11 Allergies  Allergen Reactions  . Codeine   . Coconut Oil Itching      Social History   Socioeconomic History  . Marital status: Divorced    Spouse name: Not on file  . Number of children: Not on file  . Years of education: Not on file  . Highest education level: Not on file  Occupational History  . Occupation: disability    Comment: stopped working in 2000 d/t occupational exposures  Tobacco Use  . Smoking status: Former Smoker    Types: Cigarettes  . Smokeless tobacco: Never Used  . Tobacco comment: "smoked when I drank"  Vaping Use  . Vaping Use: Never used  Substance and Sexual Activity  . 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 Drug use: Never  . Sexual activity: Not Currently  Other Topics Concern  . Not on file  Social History Narrative   Patient lives at home with wife and some of his 10 children. He self-administers his own medications.   Social Determinants of Health   Financial Resource Strain:   . Difficulty of Paying Living Expenses:   Food Insecurity:   . Worried About Programme researcher, broadcasting/film/video in the Last Year:   . Barista in the Last Year:   Transportation Needs:   . Freight forwarder (Medical):   Marland Kitchen Lack of Transportation (Non-Medical):   Physical Activity:   . Days of Exercise per Week:   . Minutes of Exercise per Session:   Stress:   . Feeling of Stress :   Social Connections:   . Frequency of Communication with Friends and Family:   . Frequency of Social Gatherings with Friends and Family:   . Attends Religious Services:   . Active Member of Clubs or Organizations:   . Attends Banker Meetings:   Marland Kitchen Marital Status:   Intimate Partner Violence:   . Fear of Current or Ex-Partner:   . Emotionally Abused:   Marland Kitchen Physically Abused:   . Sexually Abused:     Physical Exam Cardiovascular:     Rate and Rhythm: Normal rate and  regular rhythm.     Pulses: Normal pulses.  Pulmonary:     Effort: Pulmonary effort is normal.     Breath sounds: Normal breath sounds.  Musculoskeletal:        General: Normal range of motion.     Right lower leg: No edema.     Left lower leg: No edema.  Skin:    General: Skin is warm and dry.     Capillary Refill: Capillary refill takes less than 2 seconds.  Neurological:     Mental Status: He is alert and oriented to person, place,  and time.  Psychiatric:        Mood and Affect: Mood normal.         Future Appointments  Date Time Provider Department Center  03/02/2020  8:10 AM [facility] DEVICE REMOTES [facility]  04/05/2020  2:30 PM [facility] PA/NP [facility] None  06/01/2020  8:10 AM [facility] DEVICE REMOTES [facility]    BP 101/60 (BP Location: Left Arm, Patient Position: Sitting, Cuff Size: Normal)   Pulse 76   Resp 16   Wt 260 lb (117.9 kg)   SpO2 92%   BMI 30.83 kg/m   Weight yesterday- did not weigh Last visit weight- 259 lb  Mr Kalter was seen at home today and reported feeling well. He denied chest pain, SOB, headache, dizziness ,orthopnea, fever or cough over the past week. He stated he has been compliant with his medications and his weight has remained stable. His medications were verified and his pillbox was refilled. I will follow up next week.   [name], EMT 01/29/20  ACTION: Home visit completed Next visit planned for 1 week

## 2020-02-05 ENCOUNTER — Other Ambulatory Visit (HOSPITAL_COMMUNITY): Payer: Self-pay

## 2020-02-05 NOTE — Progress Notes (Signed)
Paramedicine Encounter    Patient ID: Adryan Shin, male    DOB: 1961/01/29, 59 y.o.   MRN: [id number]   Patient Care Team: Ananias Pilgrim, MD as PCP - General (Family Medicine) Laurey Morale, MD as PCP - Advanced Heart Failure (Cardiology) Burna Sis, LCSW as Social Worker (Licensed Clinical Social Worker)  Patient Active Problem List   Diagnosis Date Noted  . Acute on chronic combined systolic and diastolic CHF (congestive heart failure) (HCC) 03/15/2018  . S/P right and left heart catheterization 11/02/2017  . Panic attacks 11/02/2017  . Non-STEMI (non-ST elevated myocardial infarction) (HCC)   . COPD (chronic obstructive pulmonary disease) (HCC)   . Hypertension   . CKD (chronic kidney disease)   . 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HCC)   . ICD (implantable cardioverter-defibrillator) in place   . GERD (gastroesophageal reflux disease)   . Diabetes mellitus with diabetic neuropathy, with long-term current use of insulin (HCC)     Current Outpatient Medications:  .  acetaminophen (TYLENOL) 650 MG CR tablet, Take 650 mg by mouth every 8 (eight) hours as needed for pain., Disp: , Rfl:  .  albuterol (PROVENTIL) (2.5 MG/3ML) 0.083% nebulizer solution, Inhale into the lungs., Disp: , Rfl:  .  allopurinol (ZYLOPRIM) 100 MG tablet, Take 1 tablet (100 mg total) by mouth daily., Disp: 30 tablet, Rfl: 3 .  aspirin 81 MG chewable tablet, Chew 81 mg by mouth daily., Disp: , Rfl:  .  atorvastatin (LIPITOR) 40 MG tablet, TAKE 1 TABLET(40 MG) BY MOUTH DAILY AT 6 PM, Disp: 90 tablet, Rfl: 3 .  bisacodyl (DULCOLAX) 5 MG EC tablet, Take 5 mg by mouth daily as needed for moderate constipation., Disp: , Rfl:  .  BREO ELLIPTA 100-25 MCG/INH AEPB, Inhale 1 puff into the lungs daily. (Patient not taking: Reported on 01/14/2020), Disp: , Rfl: 5 .  digoxin (LANOXIN) 0.125 MG tablet, TAKE 1 TABLET(0.125 MG) BY MOUTH DAILY, Disp: 30 tablet, Rfl: 6 .  diphenhydrAMINE (BENADRYL) 25 MG tablet, Take 25 mg by  mouth at bedtime as needed., Disp: , Rfl:  .  DULoxetine (CYMBALTA) 60 MG capsule, Take 60 mg by mouth daily., Disp: , Rfl: 2 .  eplerenone (INSPRA) 50 MG tablet, Take 1 tablet (50 mg total) by mouth daily., Disp: 30 tablet, Rfl: 6 .  fenofibrate (TRICOR) 145 MG tablet, Take 1 tablet (145 mg total) by mouth daily., Disp: 30 tablet, Rfl: 11 .  hydrALAZINE (APRESOLINE) 50 MG tablet, Take 1 tablet (50 mg total) by mouth 3 (three) times daily., Disp: 90 tablet, Rfl: 3 .  Insulin Pen Needle (NOVOFINE) 30G X 8 MM MISC, Inject 10 each into the skin as needed. (Patient not taking: Reported on 01/07/2020), Disp: 100 each, Rfl: 0 .  insulin regular human CONCENTRATED (HUMULIN R U-500 KWIKPEN) 500 UNIT/ML kwikpen, USE AS DIRECTED PER SLIDING SCALE. MAX DAILY DOSE IS 400 UNITS., Disp: , Rfl:  .  isosorbide dinitrate (ISORDIL) 20 MG tablet, Take 1 tablet (20 mg total) by mouth 3 (three) times daily., Disp: 90 tablet, Rfl: 11 .  JARDIANCE 25 MG TABS tablet, Take 25 mg by mouth daily., Disp: 30 tablet, Rfl: 6 .  MAGNESIUM-OXIDE 400 (241.3 Mg) MG tablet, Take 1 tablet (400 mg total) by mouth daily., Disp: 30 tablet, Rfl: 11 .  omeprazole (PRILOSEC) 20 MG capsule, Take 1 capsule (20 mg total) by mouth daily., Disp: 30 capsule, Rfl: 1 .  pregabalin (LYRICA) 100 MG capsule, Take 1 capsule (100 mg total)  by mouth 2 (two) times daily., Disp: 60 capsule, Rfl: 3 .  ranitidine (ZANTAC) 150 MG tablet, Take 150 mg by mouth 2 (two) times daily. (Patient not taking: Reported on 12/17/2019), Disp: , Rfl:  .  sacubitril-valsartan (ENTRESTO) 97-103 MG, Take 1 tablet by mouth 2 (two) times daily., Disp: 180 tablet, Rfl: 3 .  sennosides-docusate sodium (SENOKOT-S) 8.6-50 MG tablet, Take 1 tablet by mouth daily., Disp: , Rfl:  .  torsemide (DEMADEX) 20 MG tablet, Take 2 tablets (40 mg total) by mouth daily., Disp: 180 tablet, Rfl: 3 .  Vitamin D, Cholecalciferol, 1000 units CAPS, Take 1,000 Units by mouth daily. (Patient not taking:  Reported on 01/07/2020), Disp: , Rfl: 11 Allergies  Allergen Reactions  . Codeine   . Coconut Oil Itching      Social History   Socioeconomic History  . Marital status: Divorced    Spouse name: Not on file  . Number of children: Not on file  . Years of education: Not on file  . Highest education level: Not on file  Occupational History  . Occupation: disability    Comment: stopped working in 2000 d/t occupational exposures  Tobacco Use  . Smoking status: Former Smoker    Types: Cigarettes  . Smokeless tobacco: Never Used  . Tobacco comment: "smoked when I drank"  Vaping Use  . Vaping Use: Never used  Substance and Sexual Activity  . 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 Drug use: Never  . Sexual activity: Not Currently  Other Topics Concern  . Not on file  Social History Narrative   Patient lives at home with wife and some of his 10 children. He self-administers his own medications.   Social Determinants of Health   Financial Resource Strain:   . Difficulty of Paying Living Expenses:   Food Insecurity:   . Worried About Programme researcher, broadcasting/film/video in the Last Year:   . Barista in the Last Year:   Transportation Needs:   . Freight forwarder (Medical):   Marland Kitchen Lack of Transportation (Non-Medical):   Physical Activity:   . Days of Exercise per Week:   . Minutes of Exercise per Session:   Stress:   . Feeling of Stress :   Social Connections:   . Frequency of Communication with Friends and Family:   . Frequency of Social Gatherings with Friends and Family:   . Attends Religious Services:   . Active Member of Clubs or Organizations:   . Attends Banker Meetings:   Marland Kitchen Marital Status:   Intimate Partner Violence:   . Fear of Current or Ex-Partner:   . Emotionally Abused:   Marland Kitchen Physically Abused:   . Sexually Abused:     Physical Exam Cardiovascular:     Rate and Rhythm: Normal rate and  regular rhythm.     Pulses: Normal pulses.  Pulmonary:     Effort: Pulmonary effort is normal.     Breath sounds: Normal breath sounds.  Musculoskeletal:        General: Normal range of motion.     Right lower leg: Edema present.     Left lower leg: Edema present.  Skin:    General: Skin is warm and dry.     Capillary Refill: Capillary refill takes less than 2 seconds.  Neurological:     Mental Status: He is alert and oriented to person, place,  and time.  Psychiatric:        Mood and Affect: Mood normal.         Future Appointments  Date Time Provider Department Center  03/02/2020  8:10 AM [facility] DEVICE REMOTES [facility]  04/05/2020  2:30 PM [facility] PA/NP [facility] None  06/01/2020  8:10 AM [facility] DEVICE REMOTES [facility]    BP 100/60 (BP Location: Left Arm, Patient Position: Sitting, Cuff Size: Normal)   Pulse 90   Resp 16   Wt 262 lb (118.8 kg)   SpO2 92%   BMI 31.07 kg/m   Weight yesterday- did not weigh Last visit weight- 260 lb  Mr Stuckey was seen at home today and reported feeling well. He denied chest pain, SOB, headache, dizziness, orthopnea, fever or cough over the past week. He stated he has been compliant with his medications over the past week and his weight has been stable. His medications were verified and his pillbox was refilled. I will follow up next week.   [name], EMT 02/05/20  ACTION: Home visit completed Next visit planned for 1 week

## 2020-02-12 ENCOUNTER — Telehealth (HOSPITAL_COMMUNITY): Payer: Self-pay

## 2020-02-12 NOTE — Telephone Encounter (Signed)
Mr Polinsky returned my call regarding scheduling an appointment. He stated he would be available to meet tomorrow so we agreed to meet at 09:30.   [name], EMT 02/12/20

## 2020-02-12 NOTE — Telephone Encounter (Signed)
I called Mr Richard [name] to schedule an appointment. He did not answer so I left a message requesting he call me back when he is able.   Richard [name], EMT 02/12/20

## 2020-02-13 ENCOUNTER — Other Ambulatory Visit (HOSPITAL_COMMUNITY): Payer: Self-pay

## 2020-02-13 NOTE — Progress Notes (Signed)
Paramedicine Encounter    Patient ID: Richard [name], male    DOB: 1961/01/29, 59 y.o.   MRN: [id number]   Patient Care Team: Ananias Pilgrim, MD as PCP - General (Family Medicine) Laurey Morale, MD as PCP - Advanced Heart Failure (Cardiology) Burna Sis, LCSW as Social Worker (Licensed Clinical Social Worker)  Patient Active Problem List   Diagnosis Date Noted  . Acute on chronic combined systolic and diastolic CHF (congestive heart failure) (HCC) 03/15/2018  . S/P right and left heart catheterization 11/02/2017  . Panic attacks 11/02/2017  . Non-STEMI (non-ST elevated myocardial infarction) (HCC)   . COPD (chronic obstructive pulmonary disease) (HCC)   . Hypertension   . CKD (chronic kidney disease)   . 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HCC)   . ICD (implantable cardioverter-defibrillator) in place   . GERD (gastroesophageal reflux disease)   . Diabetes mellitus with diabetic neuropathy, with long-term current use of insulin (HCC)     Current Outpatient Medications:  .  acetaminophen (TYLENOL) 650 MG CR tablet, Take 650 mg by mouth every 8 (eight) hours as needed for pain., Disp: , Rfl:  .  albuterol (PROVENTIL) (2.5 MG/3ML) 0.083% nebulizer solution, Inhale into the lungs., Disp: , Rfl:  .  allopurinol (ZYLOPRIM) 100 MG tablet, Take 1 tablet (100 mg total) by mouth daily., Disp: 30 tablet, Rfl: 3 .  aspirin 81 MG chewable tablet, Chew 81 mg by mouth daily., Disp: , Rfl:  .  atorvastatin (LIPITOR) 40 MG tablet, TAKE 1 TABLET(40 MG) BY MOUTH DAILY AT 6 PM, Disp: 90 tablet, Rfl: 3 .  bisacodyl (DULCOLAX) 5 MG EC tablet, Take 5 mg by mouth daily as needed for moderate constipation., Disp: , Rfl:  .  BREO ELLIPTA 100-25 MCG/INH AEPB, Inhale 1 puff into the lungs daily. (Patient not taking: Reported on 01/14/2020), Disp: , Rfl: 5 .  digoxin (LANOXIN) 0.125 MG tablet, TAKE 1 TABLET(0.125 MG) BY MOUTH DAILY, Disp: 30 tablet, Rfl: 6 .  diphenhydrAMINE (BENADRYL) 25 MG tablet, Take 25 mg by  mouth at bedtime as needed., Disp: , Rfl:  .  DULoxetine (CYMBALTA) 60 MG capsule, Take 60 mg by mouth daily., Disp: , Rfl: 2 .  eplerenone (INSPRA) 50 MG tablet, Take 1 tablet (50 mg total) by mouth daily., Disp: 30 tablet, Rfl: 6 .  fenofibrate (TRICOR) 145 MG tablet, Take 1 tablet (145 mg total) by mouth daily., Disp: 30 tablet, Rfl: 11 .  hydrALAZINE (APRESOLINE) 50 MG tablet, Take 1 tablet (50 mg total) by mouth 3 (three) times daily., Disp: 90 tablet, Rfl: 3 .  Insulin Pen Needle (NOVOFINE) 30G X 8 MM MISC, Inject 10 each into the skin as needed. (Patient not taking: Reported on 01/07/2020), Disp: 100 each, Rfl: 0 .  insulin regular human CONCENTRATED (HUMULIN R U-500 KWIKPEN) 500 UNIT/ML kwikpen, USE AS DIRECTED PER SLIDING SCALE. MAX DAILY DOSE IS 400 UNITS., Disp: , Rfl:  .  isosorbide dinitrate (ISORDIL) 20 MG tablet, Take 1 tablet (20 mg total) by mouth 3 (three) times daily., Disp: 90 tablet, Rfl: 11 .  JARDIANCE 25 MG TABS tablet, Take 25 mg by mouth daily., Disp: 30 tablet, Rfl: 6 .  MAGNESIUM-OXIDE 400 (241.3 Mg) MG tablet, Take 1 tablet (400 mg total) by mouth daily., Disp: 30 tablet, Rfl: 11 .  omeprazole (PRILOSEC) 20 MG capsule, Take 1 capsule (20 mg total) by mouth daily., Disp: 30 capsule, Rfl: 1 .  pregabalin (LYRICA) 100 MG capsule, Take 1 capsule (100 mg total)  by mouth 2 (two) times daily., Disp: 60 capsule, Rfl: 3 .  ranitidine (ZANTAC) 150 MG tablet, Take 150 mg by mouth 2 (two) times daily. (Patient not taking: Reported on 12/17/2019), Disp: , Rfl:  .  sacubitril-valsartan (ENTRESTO) 97-103 MG, Take 1 tablet by mouth 2 (two) times daily., Disp: 180 tablet, Rfl: 3 .  sennosides-docusate sodium (SENOKOT-S) 8.6-50 MG tablet, Take 1 tablet by mouth daily., Disp: , Rfl:  .  torsemide (DEMADEX) 20 MG tablet, Take 2 tablets (40 mg total) by mouth daily., Disp: 180 tablet, Rfl: 3 .  Vitamin D, Cholecalciferol, 1000 units CAPS, Take 1,000 Units by mouth daily. (Patient not taking:  Reported on 01/07/2020), Disp: , Rfl: 11 Allergies  Allergen Reactions  . Codeine   . Coconut Oil Itching      Social History   Socioeconomic History  . Marital status: Divorced    Spouse name: Not on file  . Number of children: Not on file  . Years of education: Not on file  . Highest education level: Not on file  Occupational History  . Occupation: disability    Comment: stopped working in 2000 d/t occupational exposures  Tobacco Use  . Smoking status: Former Smoker    Types: Cigarettes  . Smokeless tobacco: Never Used  . Tobacco comment: "smoked when I drank"  Vaping Use  . Vaping Use: Never used  Substance and Sexual Activity  . 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 Drug use: Never  . Sexual activity: Not Currently  Other Topics Concern  . Not on file  Social History Narrative   Patient lives at home with wife and some of his 10 children. He self-administers his own medications.   Social Determinants of Health   Financial Resource Strain:   . Difficulty of Paying Living Expenses: Not on file  Food Insecurity:   . Worried About Programme researcher, broadcasting/film/video in the Last Year: Not on file  . Ran Out of Food in the Last Year: Not on file  Transportation Needs:   . Lack of Transportation (Medical): Not on file  . Lack of Transportation (Non-Medical): Not on file  Physical Activity:   . Days of Exercise per Week: Not on file  . Minutes of Exercise per Session: Not on file  Stress:   . Feeling of Stress : Not on file  Social Connections:   . Frequency of Communication with Friends and Family: Not on file  . Frequency of Social Gatherings with Friends and Family: Not on file  . Attends Religious Services: Not on file  . Active Member of Clubs or Organizations: Not on file  . Attends Banker Meetings: Not on file  . Marital Status: Not on file  Intimate Partner Violence:   . Fear of Current or  Ex-Partner: Not on file  . Emotionally Abused: Not on file  . Physically Abused: Not on file  . Sexually Abused: Not on file    Physical Exam Cardiovascular:     Rate and Rhythm: Normal rate and regular rhythm.     Pulses: Normal pulses.  Pulmonary:     Effort: Pulmonary effort is normal.     Breath sounds: Normal breath sounds.  Musculoskeletal:        General: Normal range of motion.     Right lower leg: Edema present.     Left lower leg: Edema present.  Skin:  General: Skin is warm and dry.     Capillary Refill: Capillary refill takes less than 2 seconds.  Neurological:     Mental Status: He is alert and oriented to person, place, and time.  Psychiatric:        Mood and Affect: Mood normal.         Future Appointments  Date Time Provider Department Center  03/02/2020  8:10 AM [facility]ICE [facility]  04/05/2020  2:30 PM [facility] PA/NP [facility] None  06/01/2020  8:10 AM [facility]ICE [facility]OT[facility]    BP 130/80 (BP Location: Left Arm, Patient Position: Sitting, Cuff Size: Normal)   Pulse 86   Resp 16   Wt 264 lb (119.7 kg)   SpO2 92%   BMI 31.31 kg/m   Weight yesterday- did not weigh Last visit weight- 262 lb  Mr Schumpert was seen at home today and reported feeling generally well. He denied chest pain, SOB, headache, dizziness, orthopnea, fever or cough over the past week. His only complaint was a new onset of leg cramping which happens primarily at night. I will reach out to the clinic to see if they would like to get him in for labs in the near future. He reported being compliant with his medications and his weight has been stable. His medications were verified and his pillbox was refilled. I will follow up next week.   [name], EMT 02/13/20  ACTION: Home visit completed Next visit planned for 1 week

## 2020-02-19 ENCOUNTER — Other Ambulatory Visit (HOSPITAL_COMMUNITY): Payer: Self-pay

## 2020-02-19 NOTE — Progress Notes (Signed)
Richard [name] was seen at home today and reported feeling well. He denied chest pain, SOB, headache, dizziness, orthopnea, fever or cough over the past week. He stated he has been compliant with his medications over the past week which appeared to be the case given his nearly empty pillbox. His medications were verified and his pillbox was refilled. I will follow up next week.   [name], EMT 02/19/20

## 2020-02-25 ENCOUNTER — Other Ambulatory Visit (HOSPITAL_COMMUNITY): Payer: Self-pay

## 2020-02-25 ENCOUNTER — Other Ambulatory Visit (HOSPITAL_COMMUNITY): Payer: Self-pay | Admitting: Cardiology

## 2020-02-25 NOTE — Progress Notes (Signed)
Paramedicine Encounter    Patient ID: Richard [name], male    DOB: 1961/01/29, 59 y.o.   MRN: [id number]   Patient Care Team: Ananias Pilgrim, MD as PCP - General (Family Medicine) Laurey Morale, MD as PCP - Advanced Heart Failure (Cardiology) Burna Sis, LCSW as Social Worker (Licensed Clinical Social Worker)  Patient Active Problem List   Diagnosis Date Noted  . Acute on chronic combined systolic and diastolic CHF (congestive heart failure) (HCC) 03/15/2018  . S/P right and left heart catheterization 11/02/2017  . Panic attacks 11/02/2017  . Non-STEMI (non-ST elevated myocardial infarction) (HCC)   . COPD (chronic obstructive pulmonary disease) (HCC)   . Hypertension   . CKD (chronic kidney disease)   . 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HCC)   . ICD (implantable cardioverter-defibrillator) in place   . GERD (gastroesophageal reflux disease)   . Diabetes mellitus with diabetic neuropathy, with long-term current use of insulin (HCC)     Current Outpatient Medications:  .  acetaminophen (TYLENOL) 650 MG CR tablet, Take 650 mg by mouth every 8 (eight) hours as needed for pain., Disp: , Rfl:  .  albuterol (PROVENTIL) (2.5 MG/3ML) 0.083% nebulizer solution, Inhale into the lungs., Disp: , Rfl:  .  allopurinol (ZYLOPRIM) 100 MG tablet, Take 1 tablet (100 mg total) by mouth daily., Disp: 30 tablet, Rfl: 3 .  aspirin 81 MG chewable tablet, Chew 81 mg by mouth daily., Disp: , Rfl:  .  atorvastatin (LIPITOR) 40 MG tablet, TAKE 1 TABLET(40 MG) BY MOUTH DAILY AT 6 PM, Disp: 90 tablet, Rfl: 3 .  bisacodyl (DULCOLAX) 5 MG EC tablet, Take 5 mg by mouth daily as needed for moderate constipation., Disp: , Rfl:  .  BREO ELLIPTA 100-25 MCG/INH AEPB, Inhale 1 puff into the lungs daily. (Patient not taking: Reported on 01/14/2020), Disp: , Rfl: 5 .  digoxin (LANOXIN) 0.125 MG tablet, TAKE 1 TABLET(0.125 MG) BY MOUTH DAILY, Disp: 30 tablet, Rfl: 6 .  diphenhydrAMINE (BENADRYL) 25 MG tablet, Take 25 mg by  mouth at bedtime as needed., Disp: , Rfl:  .  DULoxetine (CYMBALTA) 60 MG capsule, Take 60 mg by mouth daily., Disp: , Rfl: 2 .  eplerenone (INSPRA) 50 MG tablet, Take 1 tablet (50 mg total) by mouth daily., Disp: 30 tablet, Rfl: 6 .  fenofibrate (TRICOR) 145 MG tablet, Take 1 tablet (145 mg total) by mouth daily., Disp: 30 tablet, Rfl: 11 .  hydrALAZINE (APRESOLINE) 50 MG tablet, Take 1 tablet (50 mg total) by mouth 3 (three) times daily., Disp: 90 tablet, Rfl: 3 .  Insulin Pen Needle (NOVOFINE) 30G X 8 MM MISC, Inject 10 each into the skin as needed. (Patient not taking: Reported on 01/07/2020), Disp: 100 each, Rfl: 0 .  insulin regular human CONCENTRATED (HUMULIN R U-500 KWIKPEN) 500 UNIT/ML kwikpen, USE AS DIRECTED PER SLIDING SCALE. MAX DAILY DOSE IS 400 UNITS., Disp: , Rfl:  .  isosorbide dinitrate (ISORDIL) 20 MG tablet, Take 1 tablet (20 mg total) by mouth 3 (three) times daily., Disp: 90 tablet, Rfl: 11 .  JARDIANCE 25 MG TABS tablet, Take 25 mg by mouth daily., Disp: 30 tablet, Rfl: 6 .  MAGNESIUM-OXIDE 400 (241.3 Mg) MG tablet, Take 1 tablet (400 mg total) by mouth daily., Disp: 30 tablet, Rfl: 11 .  omeprazole (PRILOSEC) 20 MG capsule, Take 1 capsule (20 mg total) by mouth daily., Disp: 30 capsule, Rfl: 1 .  pregabalin (LYRICA) 100 MG capsule, Take 1 capsule (100 mg total)  by mouth 2 (two) times daily. (Patient not taking: Reported on 02/19/2020), Disp: 60 capsule, Rfl: 3 .  ranitidine (ZANTAC) 150 MG tablet, Take 150 mg by mouth 2 (two) times daily. (Patient not taking: Reported on 12/17/2019), Disp: , Rfl:  .  sacubitril-valsartan (ENTRESTO) 97-103 MG, Take 1 tablet by mouth 2 (two) times daily., Disp: 180 tablet, Rfl: 3 .  sennosides-docusate sodium (SENOKOT-S) 8.6-50 MG tablet, Take 1 tablet by mouth daily. (Patient not taking: Reported on 02/19/2020), Disp: , Rfl:  .  torsemide (DEMADEX) 20 MG tablet, Take 2 tablets (40 mg total) by mouth daily., Disp: 180 tablet, Rfl: 3 .  Vitamin D,  Cholecalciferol, 1000 units CAPS, Take 1,000 Units by mouth daily. (Patient not taking: Reported on 01/07/2020), Disp: , Rfl: 11 Allergies  Allergen Reactions  . Codeine   . Coconut Oil Itching      Social History   Socioeconomic History  . Marital status: Divorced    Spouse name: Not on file  . Number of children: Not on file  . Years of education: Not on file  . Highest education level: Not on file  Occupational History  . Occupation: disability    Comment: stopped working in 2000 d/t occupational exposures  Tobacco Use  . Smoking status: Former Smoker    Types: Cigarettes  . Smokeless tobacco: Never Used  . Tobacco comment: "smoked when I drank"  Vaping Use  . Vaping Use: Never used  Substance and Sexual Activity  . 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 Drug use: Never  . Sexual activity: Not Currently  Other Topics Concern  . Not on file  Social History Narrative   Patient lives at home with wife and some of his 10 children. He self-administers his own medications.   Social Determinants of Health   Financial Resource Strain:   . Difficulty of Paying Living Expenses: Not on file  Food Insecurity:   . Worried About Programme researcher, broadcasting/film/video in the Last Year: Not on file  . Ran Out of Food in the Last Year: Not on file  Transportation Needs:   . Lack of Transportation (Medical): Not on file  . Lack of Transportation (Non-Medical): Not on file  Physical Activity:   . Days of Exercise per Week: Not on file  . Minutes of Exercise per Session: Not on file  Stress:   . Feeling of Stress : Not on file  Social Connections:   . Frequency of Communication with Friends and Family: Not on file  . Frequency of Social Gatherings with Friends and Family: Not on file  . Attends Religious Services: Not on file  . Active Member of Clubs or Organizations: Not on file  . Attends Banker Meetings: Not on file  .  Marital Status: Not on file  Intimate Partner Violence:   . Fear of Current or Ex-Partner: Not on file  . Emotionally Abused: Not on file  . Physically Abused: Not on file  . Sexually Abused: Not on file    Physical Exam Cardiovascular:     Rate and Rhythm: Normal rate and regular rhythm.     Pulses: Normal pulses.  Pulmonary:     Effort: Pulmonary effort is normal.     Breath sounds: Normal breath sounds.  Musculoskeletal:        General: Normal range of motion.     Right lower leg: No edema.  Left lower leg: Edema present.  Skin:    General: Skin is warm and dry.     Capillary Refill: Capillary refill takes less than 2 seconds.  Neurological:     Mental Status: He is alert and oriented to person, place, and time.  Psychiatric:        Mood and Affect: Mood normal.         Future Appointments  Date Time Provider Department Center  03/02/2020  8:10 AM [facility]  04/15/2020  2:30 PM [facility] PA/NP [facility] None  06/01/2020  8:10 AM [facility]    BP 101/69 (BP Location: Left Arm, Patient Position: Sitting, Cuff Size: Normal)   Pulse 80   Resp 18   Wt 262 lb (118.8 kg)   SpO2 95%   BMI 31.07 kg/m   Weight yesterday- 263 ln Last visit weight- 264 lb  Mr Witter was seen at home today and reported feeling well. He denied chest pain, SOB, headache, dizziness, orthopnea, fever or cough over the past week. He stated he has been compliant with his medications over the past week and his weight has been stable. His medications were verified and his pillbox was refilled. I will follow up next week.   [name], EMT 02/25/20  ACTION: Home visit completed Next visit planned for 1 week

## 2020-03-02 ENCOUNTER — Ambulatory Visit (INDEPENDENT_AMBULATORY_CARE_PROVIDER_SITE_OTHER): Payer: Medicare Other | Admitting: *Deleted

## 2020-03-02 DIAGNOSIS — I428 Other cardiomyopathies: Secondary | ICD-10-CM | POA: Diagnosis not present

## 2020-03-02 LAB — CUP PACEART REMOTE DEVICE CHECK
Battery Remaining Longevity: 93 mo
Battery Voltage: 3 V
Brady Statistic AP VP Percent: 0 %
Brady Statistic AP VS Percent: 0.1 %
Brady Statistic AS VP Percent: 0.04 %
Brady Statistic AS VS Percent: 99.85 %
Brady Statistic RA Percent Paced: 0.11 %
Brady Statistic RV Percent Paced: 0.04 %
Date Time Interrogation Session: 20210907044225
HighPow Impedance: 78 Ohm
Implantable Lead Implant Date: 20180719
Implantable Lead Implant Date: 20180719
Implantable Lead Location: 753859
Implantable Lead Location: 753860
Implantable Lead Model: 5076
Implantable Pulse Generator Implant Date: 20180719
Lead Channel Impedance Value: 342 Ohm
Lead Channel Impedance Value: 456 Ohm
Lead Channel Impedance Value: 456 Ohm
Lead Channel Pacing Threshold Amplitude: 0.75 V
Lead Channel Pacing Threshold Amplitude: 1 V
Lead Channel Pacing Threshold Pulse Width: 0.4 ms
Lead Channel Pacing Threshold Pulse Width: 0.4 ms
Lead Channel Sensing Intrinsic Amplitude: 4.5 mV
Lead Channel Sensing Intrinsic Amplitude: 4.5 mV
Lead Channel Sensing Intrinsic Amplitude: 8.5 mV
Lead Channel Sensing Intrinsic Amplitude: 8.5 mV
Lead Channel Setting Pacing Amplitude: 2 V
Lead Channel Setting Pacing Amplitude: 2 V
Lead Channel Setting Pacing Pulse Width: 0.4 ms
Lead Channel Setting Sensing Sensitivity: 0.3 mV

## 2020-03-03 ENCOUNTER — Other Ambulatory Visit (HOSPITAL_COMMUNITY): Payer: Self-pay

## 2020-03-03 NOTE — Progress Notes (Signed)
Remote ICD transmission.   

## 2020-03-03 NOTE — Progress Notes (Signed)
Paramedicine Encounter    Patient ID: Richard [name], male    DOB: 1961/01/29, 59 y.o.   MRN: [id number]   Patient Care Team: Ananias Pilgrim, MD as PCP - General (Family Medicine) Laurey Morale, MD as PCP - Advanced Heart Failure (Cardiology) Burna Sis, LCSW as Social Worker (Licensed Clinical Social Worker)  Patient Active Problem List   Diagnosis Date Noted  . Acute on chronic combined systolic and diastolic CHF (congestive heart failure) (HCC) 03/15/2018  . S/P right and left heart catheterization 11/02/2017  . Panic attacks 11/02/2017  . Non-STEMI (non-ST elevated myocardial infarction) (HCC)   . COPD (chronic obstructive pulmonary disease) (HCC)   . Hypertension   . CKD (chronic kidney disease)   . 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HCC)   . ICD (implantable cardioverter-defibrillator) in place   . GERD (gastroesophageal reflux disease)   . Diabetes mellitus with diabetic neuropathy, with long-term current use of insulin (HCC)     Current Outpatient Medications:  .  acetaminophen (TYLENOL) 650 MG CR tablet, Take 650 mg by mouth every 8 (eight) hours as needed for pain., Disp: , Rfl:  .  albuterol (PROVENTIL) (2.5 MG/3ML) 0.083% nebulizer solution, Inhale into the lungs., Disp: , Rfl:  .  allopurinol (ZYLOPRIM) 100 MG tablet, Take 1 tablet (100 mg total) by mouth daily., Disp: 30 tablet, Rfl: 3 .  aspirin 81 MG chewable tablet, Chew 81 mg by mouth daily., Disp: , Rfl:  .  atorvastatin (LIPITOR) 40 MG tablet, TAKE 1 TABLET(40 MG) BY MOUTH DAILY AT 6 PM, Disp: 90 tablet, Rfl: 3 .  bisacodyl (DULCOLAX) 5 MG EC tablet, Take 5 mg by mouth daily as needed for moderate constipation., Disp: , Rfl:  .  BREO ELLIPTA 100-25 MCG/INH AEPB, Inhale 1 puff into the lungs daily. (Patient not taking: Reported on 01/14/2020), Disp: , Rfl: 5 .  digoxin (LANOXIN) 0.125 MG tablet, TAKE 1 TABLET(0.125 MG) BY MOUTH DAILY, Disp: 30 tablet, Rfl: 6 .  diphenhydrAMINE (BENADRYL) 25 MG tablet, Take 25 mg by  mouth at bedtime as needed., Disp: , Rfl:  .  DULoxetine (CYMBALTA) 60 MG capsule, Take 60 mg by mouth daily., Disp: , Rfl: 2 .  eplerenone (INSPRA) 50 MG tablet, Take 1 tablet (50 mg total) by mouth daily., Disp: 30 tablet, Rfl: 6 .  fenofibrate (TRICOR) 145 MG tablet, Take 1 tablet (145 mg total) by mouth daily., Disp: 30 tablet, Rfl: 11 .  hydrALAZINE (APRESOLINE) 50 MG tablet, Take 1 tablet (50 mg total) by mouth 3 (three) times daily., Disp: 90 tablet, Rfl: 3 .  Insulin Pen Needle (NOVOFINE) 30G X 8 MM MISC, Inject 10 each into the skin as needed. (Patient not taking: Reported on 01/07/2020), Disp: 100 each, Rfl: 0 .  insulin regular human CONCENTRATED (HUMULIN R U-500 KWIKPEN) 500 UNIT/ML kwikpen, USE AS DIRECTED PER SLIDING SCALE. MAX DAILY DOSE IS 400 UNITS., Disp: , Rfl:  .  isosorbide dinitrate (ISORDIL) 20 MG tablet, Take 1 tablet (20 mg total) by mouth 3 (three) times daily., Disp: 90 tablet, Rfl: 11 .  JARDIANCE 25 MG TABS tablet, Take 25 mg by mouth daily., Disp: 30 tablet, Rfl: 6 .  MAGNESIUM-OXIDE 400 (241.3 Mg) MG tablet, Take 1 tablet (400 mg total) by mouth daily., Disp: 30 tablet, Rfl: 11 .  omeprazole (PRILOSEC) 20 MG capsule, Take 1 capsule (20 mg total) by mouth daily., Disp: 30 capsule, Rfl: 1 .  pregabalin (LYRICA) 100 MG capsule, Take 1 capsule (100 mg total)  by mouth 2 (two) times daily., Disp: 60 capsule, Rfl: 3 .  ranitidine (ZANTAC) 150 MG tablet, Take 150 mg by mouth 2 (two) times daily. (Patient not taking: Reported on 12/17/2019), Disp: , Rfl:  .  sacubitril-valsartan (ENTRESTO) 97-103 MG, Take 1 tablet by mouth 2 (two) times daily., Disp: 180 tablet, Rfl: 3 .  sennosides-docusate sodium (SENOKOT-S) 8.6-50 MG tablet, Take 1 tablet by mouth daily. (Patient not taking: Reported on 02/19/2020), Disp: , Rfl:  .  torsemide (DEMADEX) 20 MG tablet, Take 2 tablets (40 mg total) by mouth daily., Disp: 180 tablet, Rfl: 3 .  Vitamin D, Cholecalciferol, 1000 units CAPS, Take 1,000  Units by mouth daily. (Patient not taking: Reported on 01/07/2020), Disp: , Rfl: 11 Allergies  Allergen Reactions  . Codeine   . Coconut Oil Itching      Social History   Socioeconomic History  . Marital status: Divorced    Spouse name: Not on file  . Number of children: Not on file  . Years of education: Not on file  . Highest education level: Not on file  Occupational History  . Occupation: disability    Comment: stopped working in 2000 d/t occupational exposures  Tobacco Use  . Smoking status: Former Smoker    Types: Cigarettes  . Smokeless tobacco: Never Used  . Tobacco comment: "smoked when I drank"  Vaping Use  . Vaping Use: Never used  Substance and Sexual Activity  . 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 Drug use: Never  . Sexual activity: Not Currently  Other Topics Concern  . Not on file  Social History Narrative   Patient lives at home with wife and some of his 10 children. He self-administers his own medications.   Social Determinants of Health   Financial Resource Strain:   . Difficulty of Paying Living Expenses: Not on file  Food Insecurity:   . Worried About Programme researcher, broadcasting/film/video in the Last Year: Not on file  . Ran Out of Food in the Last Year: Not on file  Transportation Needs:   . Lack of Transportation (Medical): Not on file  . Lack of Transportation (Non-Medical): Not on file  Physical Activity:   . Days of Exercise per Week: Not on file  . Minutes of Exercise per Session: Not on file  Stress:   . Feeling of Stress : Not on file  Social Connections:   . Frequency of Communication with Friends and Family: Not on file  . Frequency of Social Gatherings with Friends and Family: Not on file  . Attends Religious Services: Not on file  . Active Member of Clubs or Organizations: Not on file  . Attends Banker Meetings: Not on file  . Marital Status: Not on file  Intimate Partner  Violence:   . Fear of Current or Ex-Partner: Not on file  . Emotionally Abused: Not on file  . Physically Abused: Not on file  . Sexually Abused: Not on file    Physical Exam Cardiovascular:     Rate and Rhythm: Normal rate and regular rhythm.     Pulses: Normal pulses.  Pulmonary:     Effort: Pulmonary effort is normal.  Abdominal:     General: There is distension.  Musculoskeletal:        General: Normal range of motion.     Right lower leg: Edema present.     Left lower  leg: Edema present.  Skin:    General: Skin is warm and dry.     Capillary Refill: Capillary refill takes less than 2 seconds.  Neurological:     Mental Status: He is alert and oriented to person, place, and time.  Psychiatric:        Mood and Affect: Mood normal.         Future Appointments  Date Time Provider Department Center  04/15/2020  2:30 PM [facility] PA/NP [facility] None  06/01/2020  8:10 AM [facility] DEVICE REMOTES [facility]  08/31/2020  8:10 AM [facility] DEVICE REMOTES [facility]  11/30/2020  8:10 AM [facility] DEVICE REMOTES [facility]  03/01/2021  8:10 AM [facility] DEVICE REMOTES [facility]  05/31/2021  8:10 AM [facility] DEVICE REMOTES [facility]  08/30/2021  8:10 AM [facility] DEVICE REMOTES [facility]  11/29/2021  8:10 AM [facility] DEVICE REMOTES [facility]    BP 135/71 (BP Location: Left Arm, Patient Position: Sitting, Cuff Size: Normal)   Pulse 84   Resp 16   Wt 266 lb (120.7 kg)   SpO2 92%   BMI 31.54 kg/m   Weight yesterday- 268 lb Last visit weight- 262 lb  Mr Wentworth was seen at home today and reported feeling well. He denied chest pain, SOB, headache, dizziness, orthopnea, fever or cough over the past week. He stated he has been compliant with his medications over the past week and his weight has been stable. His medications were verified and his pillbox was refilled. I will  follow up in two weeks.   [name], EMT 03/03/20  ACTION: Home visit completed Next visit planned for 2 weeks

## 2020-03-17 ENCOUNTER — Other Ambulatory Visit (HOSPITAL_COMMUNITY): Payer: Self-pay

## 2020-03-17 MED ORDER — SACUBITRIL-VALSARTAN 97-103 MG PO TABS: 1.0000 | ORAL_TABLET | Freq: Two times a day (BID) | ORAL | 3 refills | Status: DC

## 2020-03-17 NOTE — Progress Notes (Signed)
Paramedicine Encounter    Patient ID: Richard [name], male    DOB: February 09, 1961, 59 y.o.   MRN: [id number]   Patient Care Team: Ananias Pilgrim, MD as PCP - General (Family Medicine) Laurey Morale, MD as PCP - Advanced Heart Failure (Cardiology) Burna Sis, LCSW as Social Worker (Licensed Clinical Social Worker)  Patient Active Problem List   Diagnosis Date Noted  . Acute on chronic combined systolic and diastolic CHF (congestive heart failure) (HCC) 03/15/2018  . S/P right and left heart catheterization 11/02/2017  . Panic attacks 11/02/2017  . Non-STEMI (non-ST elevated myocardial infarction) (HCC)   . COPD (chronic obstructive pulmonary disease) (HCC)   . Hypertension   . CKD (chronic kidney disease)   . 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HCC)   . ICD (implantable cardioverter-defibrillator) in place   . GERD (gastroesophageal reflux disease)   . Diabetes mellitus with diabetic neuropathy, with long-term current use of insulin (HCC)     Current Outpatient Medications:  .  acetaminophen (TYLENOL) 650 MG CR tablet, Take 650 mg by mouth every 8 (eight) hours as needed for pain., Disp: , Rfl:  .  albuterol (PROVENTIL) (2.5 MG/3ML) 0.083% nebulizer solution, Inhale into the lungs., Disp: , Rfl:  .  allopurinol (ZYLOPRIM) 100 MG tablet, Take 1 tablet (100 mg total) by mouth daily., Disp: 30 tablet, Rfl: 3 .  aspirin 81 MG chewable tablet, Chew 81 mg by mouth daily., Disp: , Rfl:  .  atorvastatin (LIPITOR) 40 MG tablet, TAKE 1 TABLET(40 MG) BY MOUTH DAILY AT 6 PM, Disp: 90 tablet, Rfl: 3 .  bisacodyl (DULCOLAX) 5 MG EC tablet, Take 5 mg by mouth daily as needed for moderate constipation., Disp: , Rfl:  .  BREO ELLIPTA 100-25 MCG/INH AEPB, Inhale 1 puff into the lungs daily. (Patient not taking: Reported on 01/14/2020), Disp: , Rfl: 5 .  digoxin (LANOXIN) 0.125 MG tablet, TAKE 1 TABLET(0.125 MG) BY MOUTH DAILY, Disp: 30 tablet, Rfl: 6 .  diphenhydrAMINE (BENADRYL) 25 MG tablet, Take 25 mg by  mouth at bedtime as needed., Disp: , Rfl:  .  DULoxetine (CYMBALTA) 60 MG capsule, Take 60 mg by mouth daily., Disp: , Rfl: 2 .  eplerenone (INSPRA) 50 MG tablet, Take 1 tablet (50 mg total) by mouth daily., Disp: 30 tablet, Rfl: 6 .  fenofibrate (TRICOR) 145 MG tablet, Take 1 tablet (145 mg total) by mouth daily., Disp: 30 tablet, Rfl: 11 .  hydrALAZINE (APRESOLINE) 50 MG tablet, Take 1 tablet (50 mg total) by mouth 3 (three) times daily., Disp: 90 tablet, Rfl: 3 .  Insulin Pen Needle (NOVOFINE) 30G X 8 MM MISC, Inject 10 each into the skin as needed., Disp: 100 each, Rfl: 0 .  insulin regular human CONCENTRATED (HUMULIN R U-500 KWIKPEN) 500 UNIT/ML kwikpen, USE AS DIRECTED PER SLIDING SCALE. MAX DAILY DOSE IS 400 UNITS., Disp: , Rfl:  .  isosorbide dinitrate (ISORDIL) 20 MG tablet, Take 1 tablet (20 mg total) by mouth 3 (three) times daily., Disp: 90 tablet, Rfl: 11 .  JARDIANCE 25 MG TABS tablet, Take 25 mg by mouth daily., Disp: 30 tablet, Rfl: 6 .  MAGNESIUM-OXIDE 400 (241.3 Mg) MG tablet, Take 1 tablet (400 mg total) by mouth daily., Disp: 30 tablet, Rfl: 11 .  omeprazole (PRILOSEC) 20 MG capsule, Take 1 capsule (20 mg total) by mouth daily., Disp: 30 capsule, Rfl: 1 .  pregabalin (LYRICA) 100 MG capsule, Take 1 capsule (100 mg total) by mouth 2 (two) times daily.,  Disp: 60 capsule, Rfl: 3 .  ranitidine (ZANTAC) 150 MG tablet, Take 150 mg by mouth 2 (two) times daily. (Patient not taking: Reported on 12/17/2019), Disp: , Rfl:  .  sacubitril-valsartan (ENTRESTO) 97-103 MG, Take 1 tablet by mouth 2 (two) times daily., Disp: 180 tablet, Rfl: 3 .  sennosides-docusate sodium (SENOKOT-S) 8.6-50 MG tablet, Take 1 tablet by mouth daily. (Patient not taking: Reported on 02/19/2020), Disp: , Rfl:  .  torsemide (DEMADEX) 20 MG tablet, Take 2 tablets (40 mg total) by mouth daily., Disp: 180 tablet, Rfl: 3 .  Vitamin D, Cholecalciferol, 1000 units CAPS, Take 1,000 Units by mouth daily. (Patient not taking:  Reported on 01/07/2020), Disp: , Rfl: 11 Allergies  Allergen Reactions  . Codeine   . Coconut Oil Itching      Social History   Socioeconomic History  . Marital status: Divorced    Spouse name: Not on file  . Number of children: Not on file  . Years of education: Not on file  . Highest education level: Not on file  Occupational History  . Occupation: disability    Comment: stopped working in 2000 d/t occupational exposures  Tobacco Use  . Smoking status: Former Smoker    Types: Cigarettes  . Smokeless tobacco: Never Used  . Tobacco comment: "smoked when I drank"  Vaping Use  . Vaping Use: Never used  Substance and Sexual Activity  . 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 Drug use: Never  . Sexual activity: Not Currently  Other Topics Concern  . Not on file  Social History Narrative   Patient lives at home with wife and some of his 10 children. He self-administers his own medications.   Social Determinants of Health   Financial Resource Strain:   . Difficulty of Paying Living Expenses: Not on file  Food Insecurity:   . Worried About Programme researcher, broadcasting/film/video in the Last Year: Not on file  . Ran Out of Food in the Last Year: Not on file  Transportation Needs:   . Lack of Transportation (Medical): Not on file  . Lack of Transportation (Non-Medical): Not on file  Physical Activity:   . Days of Exercise per Week: Not on file  . Minutes of Exercise per Session: Not on file  Stress:   . Feeling of Stress : Not on file  Social Connections:   . Frequency of Communication with Friends and Family: Not on file  . Frequency of Social Gatherings with Friends and Family: Not on file  . Attends Religious Services: Not on file  . Active Member of Clubs or Organizations: Not on file  . Attends Banker Meetings: Not on file  . Marital Status: Not on file  Intimate Partner Violence:   . Fear of Current or  Ex-Partner: Not on file  . Emotionally Abused: Not on file  . Physically Abused: Not on file  . Sexually Abused: Not on file    Physical Exam Cardiovascular:     Rate and Rhythm: Normal rate and regular rhythm.     Pulses: Normal pulses.  Pulmonary:     Effort: Pulmonary effort is normal.     Breath sounds: Normal breath sounds.  Musculoskeletal:        General: Normal range of motion.  Skin:    General: Skin is warm and dry.     Capillary Refill: Capillary refill takes less than  2 seconds.  Neurological:     Mental Status: He is alert and oriented to person, place, and time.  Psychiatric:        Mood and Affect: Mood normal.         Future Appointments  Date Time Provider Department Center  04/15/2020  2:30 PM [facility] PA/NP [facility] None  06/01/2020  8:10 AM [facility] DEVICE REMOTES [facility]  08/31/2020  8:10 AM [facility] DEVICE REMOTES [facility]  11/30/2020  8:10 AM [facility] DEVICE REMOTES [facility]  03/01/2021  8:10 AM [facility] DEVICE REMOTES [facility]  05/31/2021  8:10 AM [facility] DEVICE REMOTES [facility]  08/30/2021  8:10 AM [facility] DEVICE REMOTES [facility]  11/29/2021  8:10 AM [facility] DEVICE REMOTES [facility]    BP 90/64 (BP Location: Left Arm, Patient Position: Sitting, Cuff Size: Normal)   Pulse 80   Resp 16   Wt 263 lb (119.3 kg)   SpO2 96%   BMI 31.19 kg/m   Weight yesterday- 263 lb Last visit weight- 266 lb  Mr Amick was seen at home today and reported feeling well. He denied chest pain, SOB, headache, dizziness, orthopnea, fever or cough. He reported being compliant with his medications over the past week and his weight has been stable. His medications were verfied and his pillbox was refilled. We called in several medications which he needs to pick up by Friday which he assured me he would. I offered to come back and add the necessary  medications to his pillbox but he refused saying that he could look and tell which medications need to be added to what spots. Specifically he should be adding Jardiance to Friday through Wednesday mornings and Lyrica to Friday night through Wednesday morning. The necessary medications are scheduled to be ready for pick up by tomorrow at 11:00 am. I will follow up next week.    [name], EMT 03/17/20  ACTION: Home visit completed Next visit planned for Friday

## 2020-03-24 ENCOUNTER — Other Ambulatory Visit (HOSPITAL_COMMUNITY): Payer: Self-pay

## 2020-03-24 NOTE — Progress Notes (Signed)
Paramedicine Encounter    Patient ID: Richard [name], male    DOB: 1961/01/13, 59 y.o.   MRN: [id number]   Patient Care Team: Ananias Pilgrim, MD as PCP - General (Family Medicine) Laurey Morale, MD as PCP - Advanced Heart Failure (Cardiology) Burna Sis, LCSW as Social Worker (Licensed Clinical Social Worker)  Patient Active Problem List   Diagnosis Date Noted  . Acute on chronic combined systolic and diastolic CHF (congestive heart failure) (HCC) 03/15/2018  . S/P right and left heart catheterization 11/02/2017  . Panic attacks 11/02/2017  . Non-STEMI (non-ST elevated myocardial infarction) (HCC)   . COPD (chronic obstructive pulmonary disease) (HCC)   . Hypertension   . CKD (chronic kidney disease)   . 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HCC)   . ICD (implantable cardioverter-defibrillator) in place   . GERD (gastroesophageal reflux disease)   . Diabetes mellitus with diabetic neuropathy, with long-term current use of insulin (HCC)     Current Outpatient Medications:  .  acetaminophen (TYLENOL) 650 MG CR tablet, Take 650 mg by mouth every 8 (eight) hours as needed for pain., Disp: , Rfl:  .  albuterol (PROVENTIL) (2.5 MG/3ML) 0.083% nebulizer solution, Inhale into the lungs., Disp: , Rfl:  .  allopurinol (ZYLOPRIM) 100 MG tablet, Take 1 tablet (100 mg total) by mouth daily., Disp: 30 tablet, Rfl: 3 .  aspirin 81 MG chewable tablet, Chew 81 mg by mouth daily., Disp: , Rfl:  .  atorvastatin (LIPITOR) 40 MG tablet, TAKE 1 TABLET(40 MG) BY MOUTH DAILY AT 6 PM, Disp: 90 tablet, Rfl: 3 .  bisacodyl (DULCOLAX) 5 MG EC tablet, Take 5 mg by mouth daily as needed for moderate constipation., Disp: , Rfl:  .  BREO ELLIPTA 100-25 MCG/INH AEPB, Inhale 1 puff into the lungs daily. (Patient not taking: Reported on 01/14/2020), Disp: , Rfl: 5 .  digoxin (LANOXIN) 0.125 MG tablet, TAKE 1 TABLET(0.125 MG) BY MOUTH DAILY, Disp: 30 tablet, Rfl: 6 .  diphenhydrAMINE (BENADRYL) 25 MG tablet, Take 25 mg by  mouth at bedtime as needed., Disp: , Rfl:  .  DULoxetine (CYMBALTA) 60 MG capsule, Take 60 mg by mouth daily., Disp: , Rfl: 2 .  eplerenone (INSPRA) 50 MG tablet, Take 1 tablet (50 mg total) by mouth daily., Disp: 30 tablet, Rfl: 6 .  fenofibrate (TRICOR) 145 MG tablet, Take 1 tablet (145 mg total) by mouth daily., Disp: 30 tablet, Rfl: 11 .  hydrALAZINE (APRESOLINE) 50 MG tablet, Take 1 tablet (50 mg total) by mouth 3 (three) times daily., Disp: 90 tablet, Rfl: 3 .  Insulin Pen Needle (NOVOFINE) 30G X 8 MM MISC, Inject 10 each into the skin as needed., Disp: 100 each, Rfl: 0 .  insulin regular human CONCENTRATED (HUMULIN R U-500 KWIKPEN) 500 UNIT/ML kwikpen, USE AS DIRECTED PER SLIDING SCALE. MAX DAILY DOSE IS 400 UNITS., Disp: , Rfl:  .  isosorbide dinitrate (ISORDIL) 20 MG tablet, Take 1 tablet (20 mg total) by mouth 3 (three) times daily., Disp: 90 tablet, Rfl: 11 .  JARDIANCE 25 MG TABS tablet, Take 25 mg by mouth daily., Disp: 30 tablet, Rfl: 6 .  MAGNESIUM-OXIDE 400 (241.3 Mg) MG tablet, Take 1 tablet (400 mg total) by mouth daily., Disp: 30 tablet, Rfl: 11 .  omeprazole (PRILOSEC) 20 MG capsule, Take 1 capsule (20 mg total) by mouth daily. (Patient not taking: Reported on 03/17/2020), Disp: 30 capsule, Rfl: 1 .  pregabalin (LYRICA) 100 MG capsule, Take 1 capsule (100 mg total)  by mouth 2 (two) times daily., Disp: 60 capsule, Rfl: 3 .  ranitidine (ZANTAC) 150 MG tablet, Take 150 mg by mouth 2 (two) times daily. (Patient not taking: Reported on 12/17/2019), Disp: , Rfl:  .  sacubitril-valsartan (ENTRESTO) 97-103 MG, Take 1 tablet by mouth 2 (two) times daily., Disp: 180 tablet, Rfl: 3 .  sennosides-docusate sodium (SENOKOT-S) 8.6-50 MG tablet, Take 1 tablet by mouth daily. (Patient not taking: Reported on 02/19/2020), Disp: , Rfl:  .  torsemide (DEMADEX) 20 MG tablet, Take 2 tablets (40 mg total) by mouth daily., Disp: 180 tablet, Rfl: 3 .  Vitamin D, Cholecalciferol, 1000 units CAPS, Take 1,000  Units by mouth daily. (Patient not taking: Reported on 01/07/2020), Disp: , Rfl: 11 Allergies  Allergen Reactions  . Codeine   . Coconut Oil Itching      Social History   Socioeconomic History  . Marital status: Divorced    Spouse name: Not on file  . Number of children: Not on file  . Years of education: Not on file  . Highest education level: Not on file  Occupational History  . Occupation: disability    Comment: stopped working in 2000 d/t occupational exposures  Tobacco Use  . Smoking status: Former Smoker    Types: Cigarettes  . Smokeless tobacco: Never Used  . Tobacco comment: "smoked when I drank"  Vaping Use  . Vaping Use: Never used  Substance and Sexual Activity  . 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 Drug use: Never  . Sexual activity: Not Currently  Other Topics Concern  . Not on file  Social History Narrative   Patient lives at home with wife and some of his 10 children. He self-administers his own medications.   Social Determinants of Health   Financial Resource Strain:   . Difficulty of Paying Living Expenses: Not on file  Food Insecurity:   . Worried About Programme researcher, broadcasting/film/video in the Last Year: Not on file  . Ran Out of Food in the Last Year: Not on file  Transportation Needs:   . Lack of Transportation (Medical): Not on file  . Lack of Transportation (Non-Medical): Not on file  Physical Activity:   . Days of Exercise per Week: Not on file  . Minutes of Exercise per Session: Not on file  Stress:   . Feeling of Stress : Not on file  Social Connections:   . Frequency of Communication with Friends and Family: Not on file  . Frequency of Social Gatherings with Friends and Family: Not on file  . Attends Religious Services: Not on file  . Active Member of Clubs or Organizations: Not on file  . Attends Banker Meetings: Not on file  . Marital Status: Not on file  Intimate Partner  Violence:   . Fear of Current or Ex-Partner: Not on file  . Emotionally Abused: Not on file  . Physically Abused: Not on file  . Sexually Abused: Not on file    Physical Exam Cardiovascular:     Rate and Rhythm: Normal rate and regular rhythm.     Pulses: Normal pulses.  Pulmonary:     Effort: Pulmonary effort is normal.     Breath sounds: Normal breath sounds.  Musculoskeletal:        General: Normal range of motion.     Right lower leg: No edema.     Left lower leg:  No edema.  Skin:    General: Skin is warm.     Capillary Refill: Capillary refill takes less than 2 seconds.  Neurological:     Mental Status: He is alert and oriented to person, place, and time.  Psychiatric:        Mood and Affect: Mood normal.         Future Appointments  Date Time Provider Department Center  04/15/2020  2:30 PM [facility] PA/NP [facility] None  06/01/2020  8:10 AM [facility] DEVICE REMOTES [facility]  08/31/2020  8:10 AM [facility] DEVICE REMOTES [facility]  11/30/2020  8:10 AM [facility] DEVICE REMOTES [facility]  03/01/2021  8:10 AM [facility] DEVICE REMOTES [facility]  05/31/2021  8:10 AM [facility] DEVICE REMOTES [facility]  08/30/2021  8:10 AM [facility] DEVICE REMOTES [facility]  11/29/2021  8:10 AM [facility] DEVICE REMOTES [facility]    BP 91/60 (BP Location: Left Arm, Patient Position: Sitting, Cuff Size: Large)   Pulse 80   Resp 18   Wt 263 lb (119.3 kg)   SpO2 94%   BMI 31.19 kg/m   Weight yesterday- 263 lb Last visit weight- 263 lb  Mr Muzzy was seen at home today and reported feeling well. He denied chest pain, SOB, headache, dizziness, orthopnea, fever or cough over the past week. He stated he has been compliant with his medications over the past week and his weight has been stable. His medications were verified and his pillbox was refilled. I will follow up next week.    [name], EMT 03/24/20  ACTION: Home visit completed Next visit planned for 1 week

## 2020-03-31 ENCOUNTER — Telehealth (HOSPITAL_COMMUNITY): Payer: Self-pay | Admitting: *Deleted

## 2020-03-31 ENCOUNTER — Other Ambulatory Visit (HOSPITAL_COMMUNITY): Payer: Self-pay

## 2020-03-31 MED ORDER — POTASSIUM CHLORIDE ER 20 MEQ PO TBCR: EXTENDED_RELEASE_TABLET | ORAL | 0 refills | Status: DC

## 2020-03-31 NOTE — Telephone Encounter (Signed)
Richard [name] w/paramedicine called to report that pts weight is up 7lbs x 1week. Pts abdomen is very distended. Pt c/o trouble with bowel movement patient has not had a bowel movement today. Pt said his urine output is fine, per Richard [name] pts lungs are clear.  Routed to Dr.McLean for advice

## 2020-03-31 NOTE — Progress Notes (Signed)
Paramedicine Encounter    Patient ID: Richard [name], male    DOB: 1961/01/13, 59 y.o.   MRN: [id number]   Patient Care Team: Ananias Pilgrim, MD as PCP - General (Family Medicine) Laurey Morale, MD as PCP - Advanced Heart Failure (Cardiology) Burna Sis, LCSW as Social Worker (Licensed Clinical Social Worker)  Patient Active Problem List   Diagnosis Date Noted  . Acute on chronic combined systolic and diastolic CHF (congestive heart failure) (HCC) 03/15/2018  . S/P right and left heart catheterization 11/02/2017  . Panic attacks 11/02/2017  . Non-STEMI (non-ST elevated myocardial infarction) (HCC)   . COPD (chronic obstructive pulmonary disease) (HCC)   . Hypertension   . CKD (chronic kidney disease)   . 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HCC)   . ICD (implantable cardioverter-defibrillator) in place   . GERD (gastroesophageal reflux disease)   . Diabetes mellitus with diabetic neuropathy, with long-term current use of insulin (HCC)     Current Outpatient Medications:  .  acetaminophen (TYLENOL) 650 MG CR tablet, Take 650 mg by mouth every 8 (eight) hours as needed for pain., Disp: , Rfl:  .  albuterol (PROVENTIL) (2.5 MG/3ML) 0.083% nebulizer solution, Inhale into the lungs., Disp: , Rfl:  .  allopurinol (ZYLOPRIM) 100 MG tablet, Take 1 tablet (100 mg total) by mouth daily., Disp: 30 tablet, Rfl: 3 .  aspirin 81 MG chewable tablet, Chew 81 mg by mouth daily., Disp: , Rfl:  .  atorvastatin (LIPITOR) 40 MG tablet, TAKE 1 TABLET(40 MG) BY MOUTH DAILY AT 6 PM, Disp: 90 tablet, Rfl: 3 .  bisacodyl (DULCOLAX) 5 MG EC tablet, Take 5 mg by mouth daily as needed for moderate constipation., Disp: , Rfl:  .  BREO ELLIPTA 100-25 MCG/INH AEPB, Inhale 1 puff into the lungs daily. (Patient not taking: Reported on 01/14/2020), Disp: , Rfl: 5 .  digoxin (LANOXIN) 0.125 MG tablet, TAKE 1 TABLET(0.125 MG) BY MOUTH DAILY, Disp: 30 tablet, Rfl: 6 .  diphenhydrAMINE (BENADRYL) 25 MG tablet, Take 25 mg by  mouth at bedtime as needed., Disp: , Rfl:  .  DULoxetine (CYMBALTA) 60 MG capsule, Take 60 mg by mouth daily., Disp: , Rfl: 2 .  eplerenone (INSPRA) 50 MG tablet, Take 1 tablet (50 mg total) by mouth daily., Disp: 30 tablet, Rfl: 6 .  fenofibrate (TRICOR) 145 MG tablet, Take 1 tablet (145 mg total) by mouth daily., Disp: 30 tablet, Rfl: 11 .  hydrALAZINE (APRESOLINE) 50 MG tablet, Take 1 tablet (50 mg total) by mouth 3 (three) times daily., Disp: 90 tablet, Rfl: 3 .  Insulin Pen Needle (NOVOFINE) 30G X 8 MM MISC, Inject 10 each into the skin as needed., Disp: 100 each, Rfl: 0 .  insulin regular human CONCENTRATED (HUMULIN R U-500 KWIKPEN) 500 UNIT/ML kwikpen, USE AS DIRECTED PER SLIDING SCALE. MAX DAILY DOSE IS 400 UNITS., Disp: , Rfl:  .  isosorbide dinitrate (ISORDIL) 20 MG tablet, Take 1 tablet (20 mg total) by mouth 3 (three) times daily., Disp: 90 tablet, Rfl: 11 .  JARDIANCE 25 MG TABS tablet, Take 25 mg by mouth daily., Disp: 30 tablet, Rfl: 6 .  MAGNESIUM-OXIDE 400 (241.3 Mg) MG tablet, Take 1 tablet (400 mg total) by mouth daily., Disp: 30 tablet, Rfl: 11 .  omeprazole (PRILOSEC) 20 MG capsule, Take 1 capsule (20 mg total) by mouth daily. (Patient not taking: Reported on 03/17/2020), Disp: 30 capsule, Rfl: 1 .  pregabalin (LYRICA) 100 MG capsule, Take 1 capsule (100 mg total)  by mouth 2 (two) times daily., Disp: 60 capsule, Rfl: 3 .  ranitidine (ZANTAC) 150 MG tablet, Take 150 mg by mouth 2 (two) times daily. (Patient not taking: Reported on 12/17/2019), Disp: , Rfl:  .  sacubitril-valsartan (ENTRESTO) 97-103 MG, Take 1 tablet by mouth 2 (two) times daily., Disp: 180 tablet, Rfl: 3 .  sennosides-docusate sodium (SENOKOT-S) 8.6-50 MG tablet, Take 1 tablet by mouth daily. (Patient not taking: Reported on 02/19/2020), Disp: , Rfl:  .  torsemide (DEMADEX) 20 MG tablet, Take 2 tablets (40 mg total) by mouth daily., Disp: 180 tablet, Rfl: 3 .  Vitamin D, Cholecalciferol, 1000 units CAPS, Take 1,000  Units by mouth daily. (Patient not taking: Reported on 01/07/2020), Disp: , Rfl: 11 Allergies  Allergen Reactions  . Codeine   . Coconut Oil Itching      Social History   Socioeconomic History  . Marital status: Divorced    Spouse name: Not on file  . Number of children: Not on file  . Years of education: Not on file  . Highest education level: Not on file  Occupational History  . Occupation: disability    Comment: stopped working in 2000 d/t occupational exposures  Tobacco Use  . Smoking status: Former Smoker    Types: Cigarettes  . Smokeless tobacco: Never Used  . Tobacco comment: "smoked when I drank"  Vaping Use  . Vaping Use: Never used  Substance and Sexual Activity  . 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 Drug use: Never  . Sexual activity: Not Currently  Other Topics Concern  . Not on file  Social History Narrative   Patient lives at home with wife and some of his 10 children. He self-administers his own medications.   Social Determinants of Health   Financial Resource Strain:   . Difficulty of Paying Living Expenses: Not on file  Food Insecurity:   . Worried About Programme researcher, broadcasting/film/video in the Last Year: Not on file  . Ran Out of Food in the Last Year: Not on file  Transportation Needs:   . Lack of Transportation (Medical): Not on file  . Lack of Transportation (Non-Medical): Not on file  Physical Activity:   . Days of Exercise per Week: Not on file  . Minutes of Exercise per Session: Not on file  Stress:   . Feeling of Stress : Not on file  Social Connections:   . Frequency of Communication with Friends and Family: Not on file  . Frequency of Social Gatherings with Friends and Family: Not on file  . Attends Religious Services: Not on file  . Active Member of Clubs or Organizations: Not on file  . Attends Banker Meetings: Not on file  . Marital Status: Not on file  Intimate Partner  Violence:   . Fear of Current or Ex-Partner: Not on file  . Emotionally Abused: Not on file  . Physically Abused: Not on file  . Sexually Abused: Not on file    Physical Exam Cardiovascular:     Rate and Rhythm: Normal rate and regular rhythm.     Pulses: Normal pulses.  Pulmonary:     Effort: Pulmonary effort is normal.     Breath sounds: Normal breath sounds.  Abdominal:     General: There is distension.  Musculoskeletal:        General: Normal range of motion.     Right lower  leg: Edema present.     Left lower leg: Edema present.  Skin:    General: Skin is warm and dry.     Capillary Refill: Capillary refill takes less than 2 seconds.  Neurological:     Mental Status: He is alert and oriented to person, place, and time.  Psychiatric:        Mood and Affect: Mood normal.         Future Appointments  Date Time Provider Department Center  04/15/2020  2:30 PM [facility] PA/NP [facility] None  06/01/2020  8:10 AM [facility] DEVICE REMOTES [facility]  08/31/2020  8:10 AM [facility] DEVICE REMOTES [facility]  11/30/2020  8:10 AM [facility] DEVICE REMOTES [facility]  03/01/2021  8:10 AM [facility] DEVICE REMOTES [facility]  05/31/2021  8:10 AM [facility] DEVICE REMOTES [facility]  08/30/2021  8:10 AM [facility] DEVICE REMOTES [facility]  11/29/2021  8:10 AM [facility] DEVICE REMOTES [facility]    BP (!) 134/99 (BP Location: Left Arm, Patient Position: Sitting, Cuff Size: Large)   Pulse 80   Resp 16   Wt 270 lb (122.5 kg)   SpO2 93%   BMI 32.02 kg/m   Weight yesterday- 269 lb Last visit weight- 263 lb  Mr Kaps was seen at home today and reported feeling unwell. He reported feeling slightly SOB with a distended abdomen over the past few days. He reported having poor bowel movements beginning yesterday and his weight has been creeping up over the past week. I contacted the HF  clinic and let them know he has gained 7 lb since last week. He was instructed to take 40 mg of torsemide BID for the next three days with 20 mEq of potassium. This addition was made to his pillbox and he stated he would pick up the potassium as soon as possible. He was also advised to try an OTC laxative and if his bowel movements do not return to normal to contacted his PCP. He was understanding and agreeable. I will follow up next week.   [name], EMT 03/31/20  ACTION: Home visit completed Next visit planned for 1 week

## 2020-03-31 NOTE — Telephone Encounter (Signed)
[name] aware. Potassium sent to pharmacy.

## 2020-03-31 NOTE — Telephone Encounter (Signed)
Increase torsemide to 40 mg bid x 3 days then back to 40 daily.  Take KCl 20 daily while on extra torsemide.

## 2020-04-05 ENCOUNTER — Encounter (HOSPITAL_COMMUNITY): Payer: Medicare Other

## 2020-04-07 ENCOUNTER — Other Ambulatory Visit (HOSPITAL_COMMUNITY): Payer: Self-pay

## 2020-04-07 NOTE — Progress Notes (Signed)
Paramedicine Encounter    Patient ID: Richard [name], male    DOB: 12-22-1960, 59 y.o.   MRN: [id number]   Patient Care Team: Ananias Pilgrim, MD as PCP - General (Family Medicine) Laurey Morale, MD as PCP - Advanced Heart Failure (Cardiology) Burna Sis, LCSW as Social Worker (Licensed Clinical Social Worker)  Patient Active Problem List   Diagnosis Date Noted   Acute on chronic combined systolic and diastolic CHF (congestive heart failure) (HCC) 03/15/2018   S/P right and left heart catheterization 11/02/2017   Panic attacks 11/02/2017   Non-STEMI (non-ST elevated myocardial infarction) (HCC)    COPD (chronic obstructive pulmonary disease) (HCC)    Hypertension    CKD (chronic kidney disease)    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HCC)    ICD (implantable cardioverter-defibrillator) in place    GERD (gastroesophageal reflux disease)    Diabetes mellitus with diabetic neuropathy, with long-term current use of insulin (HCC)     Current Outpatient Medications:    acetaminophen (TYLENOL) 650 MG CR tablet, Take 650 mg by mouth every 8 (eight) hours as needed for pain., Disp: , Rfl:    albuterol (PROVENTIL) (2.5 MG/3ML) 0.083% nebulizer solution, Inhale into the lungs., Disp: , Rfl:    allopurinol (ZYLOPRIM) 100 MG tablet, Take 1 tablet (100 mg total) by mouth daily., Disp: 30 tablet, Rfl: 3   aspirin 81 MG chewable tablet, Chew 81 mg by mouth daily., Disp: , Rfl:    atorvastatin (LIPITOR) 40 MG tablet, TAKE 1 TABLET(40 MG) BY MOUTH DAILY AT 6 PM, Disp: 90 tablet, Rfl: 3   bisacodyl (DULCOLAX) 5 MG EC tablet, Take 5 mg by mouth daily as needed for moderate constipation., Disp: , Rfl:    BREO ELLIPTA 100-25 MCG/INH AEPB, Inhale 1 puff into the lungs daily. (Patient not taking: Reported on 01/14/2020), Disp: , Rfl: 5   digoxin (LANOXIN) 0.125 MG tablet, TAKE 1 TABLET(0.125 MG) BY MOUTH DAILY, Disp: 30 tablet, Rfl: 6   diphenhydrAMINE (BENADRYL) 25 MG tablet, Take 25 mg by  mouth at bedtime as needed., Disp: , Rfl:    DULoxetine (CYMBALTA) 60 MG capsule, Take 60 mg by mouth daily., Disp: , Rfl: 2   eplerenone (INSPRA) 50 MG tablet, Take 1 tablet (50 mg total) by mouth daily., Disp: 30 tablet, Rfl: 6   fenofibrate (TRICOR) 145 MG tablet, Take 1 tablet (145 mg total) by mouth daily., Disp: 30 tablet, Rfl: 11   hydrALAZINE (APRESOLINE) 50 MG tablet, Take 1 tablet (50 mg total) by mouth 3 (three) times daily., Disp: 90 tablet, Rfl: 3   Insulin Pen Needle (NOVOFINE) 30G X 8 MM MISC, Inject 10 each into the skin as needed., Disp: 100 each, Rfl: 0   insulin regular human CONCENTRATED (HUMULIN R U-500 KWIKPEN) 500 UNIT/ML kwikpen, USE AS DIRECTED PER SLIDING SCALE. MAX DAILY DOSE IS 400 UNITS., Disp: , Rfl:    isosorbide dinitrate (ISORDIL) 20 MG tablet, Take 1 tablet (20 mg total) by mouth 3 (three) times daily., Disp: 90 tablet, Rfl: 11   JARDIANCE 25 MG TABS tablet, Take 25 mg by mouth daily., Disp: 30 tablet, Rfl: 6   MAGNESIUM-OXIDE 400 (241.3 Mg) MG tablet, Take 1 tablet (400 mg total) by mouth daily., Disp: 30 tablet, Rfl: 11   omeprazole (PRILOSEC) 20 MG capsule, Take 1 capsule (20 mg total) by mouth daily., Disp: 30 capsule, Rfl: 1   potassium chloride 20 MEQ TBCR, As directed by CHF clinic., Disp: 10 tablet, Rfl: 0  pregabalin (LYRICA) 100 MG capsule, Take 1 capsule (100 mg total) by mouth 2 (two) times daily., Disp: 60 capsule, Rfl: 3   ranitidine (ZANTAC) 150 MG tablet, Take 150 mg by mouth 2 (two) times daily. (Patient not taking: Reported on 12/17/2019), Disp: , Rfl:    sacubitril-valsartan (ENTRESTO) 97-103 MG, Take 1 tablet by mouth 2 (two) times daily., Disp: 180 tablet, Rfl: 3   sennosides-docusate sodium (SENOKOT-S) 8.6-50 MG tablet, Take 1 tablet by mouth daily. (Patient not taking: Reported on 02/19/2020), Disp: , Rfl:    torsemide (DEMADEX) 20 MG tablet, Take 2 tablets (40 mg total) by mouth daily., Disp: 180 tablet, Rfl: 3   Vitamin D,  Cholecalciferol, 1000 units CAPS, Take 1,000 Units by mouth daily. (Patient not taking: Reported on 01/07/2020), Disp: , Rfl: 11 Allergies  Allergen Reactions   Codeine    Coconut Oil Itching      Social History   Socioeconomic History   Marital status: Divorced    Spouse name: Not on file   Number of children: Not on file   Years of education: Not on file   Highest education level: Not on file  Occupational History   Occupation: disability    Comment: stopped working in 2000 d/t occupational exposures  Tobacco Use   Smoking status: Former Smoker    Types: Cigarettes   Smokeless tobacco: Never Used   Tobacco comment: "smoked when I drank"  Vaping Use   Vaping Use: Never used  Substance and Sexual Activity   Alcohol use: Not Currently    Comment: 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  Other Topics Concern   Not on file  Social History Narrative   Patient lives at home with wife and some of his 10 children. He self-administers his own medications.   Social Determinants of Health   Financial Resource Strain:    Difficulty of Paying Living Expenses: Not on file  Food Insecurity:    Worried About Programme researcher, broadcasting/film/video in the Last Year: Not on file   [employer] of Food in the Last Year: Not on file  Transportation Needs:    Lack of Transportation (Medical): Not on file   Lack of Transportation (Non-Medical): Not on file  Physical Activity:    Days of Exercise per Week: Not on file   Minutes of Exercise per Session: Not on file  Stress:    Feeling of Stress : Not on file  Social Connections:    Frequency of Communication with Friends and Family: Not on file   Frequency of Social Gatherings with Friends and Family: Not on file   Attends Religious Services: Not on file   Active Member of Clubs or Organizations: Not on file   Attends Banker Meetings: Not on file    Marital Status: Not on file  Intimate Partner Violence:    Fear of Current or Ex-Partner: Not on file   Emotionally Abused: Not on file   Physically Abused: Not on file   Sexually Abused: Not on file    Physical Exam Cardiovascular:     Rate and Rhythm: Normal rate and regular rhythm.     Pulses: Normal pulses.  Pulmonary:     Effort: Pulmonary effort is normal.     Breath sounds: Normal breath sounds.  Abdominal:     General: There is distension.  Musculoskeletal:  General: Normal range of motion.     Right lower leg: No edema.     Left lower leg: No edema.  Skin:    General: Skin is warm and dry.     Capillary Refill: Capillary refill takes less than 2 seconds.  Neurological:     Mental Status: He is alert and oriented to person, place, and time.  Psychiatric:        Mood and Affect: Mood normal.         Future Appointments  Date Time Provider Department Center  04/15/2020  2:30 PM [facility] PA/NP [facility] None  06/01/2020  8:10 AM [facility] DEVICE REMOTES [facility]  08/31/2020  8:10 AM [facility] DEVICE REMOTES [facility]  11/30/2020  8:10 AM [facility] DEVICE REMOTES [facility]  03/01/2021  8:10 AM [facility] DEVICE REMOTES [facility]  05/31/2021  8:10 AM [facility] DEVICE REMOTES [facility]  08/30/2021  8:10 AM [facility] DEVICE REMOTES [facility]  11/29/2021  8:10 AM [facility] DEVICE REMOTES [facility]    BP (!) 100/48 (BP Location: Left Arm, Patient Position: Sitting, Cuff Size: Large)    Pulse 94    Resp 16    Wt 266 lb (120.7 kg)    SpO2 92%    BMI 31.54 kg/m   Weight yesterday- 264 lb Last visit weight- 270 lb  Mr Mcgillicuddy was seen at home today and reported feeling well. He denied chest pain, SOB, headache, dizziness, orthopnea, fever or cough. He is still having difficulty moving his bowels but he is urinating frequently and without difficulty.  Additionally his abdomen remains distended. He is currently using OTC stool softeners and Magnesium citrate. He reported come relief from this but he stated he was going to try walk more to help make his bowels move. I recommended he speak to his PCP regarding this as well and he stated he wanted to try and handle it at home first. His medications were verified and his pillbox was refilled I will follow up next week.   [name], EMT 04/07/20  ACTION: Home visit completed Next visit planned for 1 week

## 2020-04-14 ENCOUNTER — Telehealth (HOSPITAL_COMMUNITY): Payer: Self-pay

## 2020-04-14 NOTE — Telephone Encounter (Signed)
I called Mr Richard [name] to see if he was available for an appointment. He did not answer so I left a message requesting he call me back.   [name], EMT 04/14/20

## 2020-04-15 ENCOUNTER — Telehealth (HOSPITAL_COMMUNITY): Payer: Self-pay

## 2020-04-15 ENCOUNTER — Ambulatory Visit (HOSPITAL_COMMUNITY)
Admission: RE | Admit: 2020-04-15 | Discharge: 2020-04-15 | Disposition: A | Discharge status: Home / Self Care | Payer: Medicare Other | Source: Ambulatory Visit | Attending: Adult Health | Admitting: Adult Health

## 2020-04-15 ENCOUNTER — Other Ambulatory Visit (HOSPITAL_COMMUNITY): Payer: Self-pay

## 2020-04-15 ENCOUNTER — Other Ambulatory Visit: Payer: Self-pay

## 2020-04-15 VITALS — BP 128/90 | HR 69 | Wt 271.0 lb

## 2020-04-15 DIAGNOSIS — I2510 Atherosclerotic heart disease of native coronary artery without angina pectoris: Secondary | ICD-10-CM | POA: Insufficient documentation

## 2020-04-15 DIAGNOSIS — E1140 Type 2 diabetes mellitus with diabetic neuropathy, unspecified: Secondary | ICD-10-CM | POA: Diagnosis not present

## 2020-04-15 DIAGNOSIS — J449 Chronic obstructive pulmonary disease, unspecified: Secondary | ICD-10-CM | POA: Insufficient documentation

## 2020-04-15 DIAGNOSIS — Z87891 Personal history of nicotine dependence: Secondary | ICD-10-CM | POA: Insufficient documentation

## 2020-04-15 DIAGNOSIS — E1122 Type 2 diabetes mellitus with diabetic chronic kidney disease: Secondary | ICD-10-CM | POA: Diagnosis not present

## 2020-04-15 DIAGNOSIS — Z794 Long term (current) use of insulin: Secondary | ICD-10-CM | POA: Insufficient documentation

## 2020-04-15 DIAGNOSIS — I5022 Chronic systolic (congestive) heart failure: Secondary | ICD-10-CM

## 2020-04-15 DIAGNOSIS — I130 Hypertensive heart and chronic kidney disease with heart failure and stage 1 through stage 4 chronic kidney disease, or unspecified chronic kidney disease: Secondary | ICD-10-CM | POA: Diagnosis present

## 2020-04-15 DIAGNOSIS — G4733 Obstructive sleep apnea (adult) (pediatric): Secondary | ICD-10-CM

## 2020-04-15 DIAGNOSIS — Z955 Presence of coronary angioplasty implant and graft: Secondary | ICD-10-CM | POA: Diagnosis not present

## 2020-04-15 DIAGNOSIS — E781 Pure hyperglyceridemia: Secondary | ICD-10-CM | POA: Insufficient documentation

## 2020-04-15 DIAGNOSIS — D751 Secondary polycythemia: Secondary | ICD-10-CM | POA: Diagnosis not present

## 2020-04-15 DIAGNOSIS — N183 Chronic kidney disease, stage 3 unspecified: Secondary | ICD-10-CM | POA: Diagnosis not present

## 2020-04-15 DIAGNOSIS — Z8249 Family history of ischemic heart disease and other diseases of the circulatory system: Secondary | ICD-10-CM | POA: Diagnosis not present

## 2020-04-15 DIAGNOSIS — Z7982 Long term (current) use of aspirin: Secondary | ICD-10-CM | POA: Insufficient documentation

## 2020-04-15 DIAGNOSIS — Z79899 Other long term (current) drug therapy: Secondary | ICD-10-CM | POA: Insufficient documentation

## 2020-04-15 DIAGNOSIS — I428 Other cardiomyopathies: Secondary | ICD-10-CM | POA: Insufficient documentation

## 2020-04-15 DIAGNOSIS — Z833 Family history of diabetes mellitus: Secondary | ICD-10-CM | POA: Insufficient documentation

## 2020-04-15 LAB — BASIC METABOLIC PANEL
Anion gap: 11 (ref 5–15)
BUN: 21 mg/dL — ABNORMAL HIGH (ref 6–20)
CO2: 35 mmol/L — ABNORMAL HIGH (ref 22–32)
Calcium: 9 mg/dL (ref 8.9–10.3)
Chloride: 97 mmol/L — ABNORMAL LOW (ref 98–111)
Creatinine, Ser: 1.93 mg/dL — ABNORMAL HIGH (ref 0.61–1.24)
GFR, Estimated: 40 mL/min — ABNORMAL LOW (ref >60)
Glucose, Bld: 122 mg/dL — ABNORMAL HIGH (ref 70–99)
Potassium: 4.3 mmol/L (ref 3.5–5.1)
Sodium: 143 mmol/L (ref 135–145)

## 2020-04-15 MED ORDER — TORSEMIDE 20 MG PO TABS: 20.0000 mg | ORAL_TABLET | Freq: Every day | ORAL | 3 refills | Status: DC

## 2020-04-15 NOTE — Progress Notes (Signed)
Richard [name] was seen at the clinic today but did not bring his medications with him. Following his appointment, I went to his home and refilled his pillbox with the directed change. Specifically he is to hold torsemide tomorrow and reduce to 20 mg daily on Saturday. Richard Bellamy was not home to relay this information so I let his family member know and they advised they would relay the information. I will follow up next week.  Of note: hydralazine, pregabalin, omeprazole, jardiance, and eplerenone were ordered and will be ready to pick up tomorrow afternoon  [name], EMT 04/15/20

## 2020-04-15 NOTE — Progress Notes (Signed)
[facility] Note   PCP: Ananias Pilgrim, MD Cardiologist: Dr Shirlee Latch  HPI: Richard [name] is a 59 y.o. male with a history of chronic systolic HF s/p Medtronic ICD (12/2016), HTN, DM, and HLD.   Admitted 5/8-5/14/19 with A/C systolic HF. Echo showed EF 15-20%. Advanced HF team was consulted. He diuresed 20 lbs with IV lasix, then transitioned to torsemide 40 mg daily. Underwent R/LHC showing a focal severe RCA stenosis treated with DES.  He did not, however, appear to have severe enough coronary disease to explain his cardiomyopathy. Cardiac index was low at 1.9.  HF meds were optimized. Beta blocker was not started due to low CI and soft BPs. He was discharged on home oxygen. Referred to cardiac rehab. DC weight: 260 lbs.   Echo in 2/20 showed EF 20-25% with severe LV dilation, moderate RV enlargement with moderately decreased RV systolic function.  CPX in 2/21 showed a severe functional limitation but it actually appeared to be primarily due to pulmonary issues.   Today he returns for HF follow up.Overall feeling fine. Biggest complaint has been ongoing joint pain. Denies SOB/PND/Orthopnea. Walking about 1 mile a day a few days a week. Appetite ok. No fever or chills. Weight at home has been stable. Taking all medications.   Optivol: Fluid index flat. No VT. No A fib. Activity ~ 2 hours.   Labs (10/19): K 3.9, creatinine 1.18  Labs (10/20): BNP 160, K 3.8, creatinine 1.31 Labs (4/21): TGs 475, LDL 67, K 4.2, creatinine 1.8, urine and serum immunofixations negative.   PMH: 1. Chronic systolic CHF: Primarily nonischemic cardiomyopathy.  Has Medtronic ICD.   - Echo 5/19: EF 15-20%, grade 2 DD, mild MR, RV severely reduced, LA moderately dilated, RA mildly dilated, mild TR.  - RHC/LHC (5/19): 80% distal LAD stenosis, 80% proximal RCA stenosis treated with DES to pRCA. Mean RA 13, PA 69/31 mean 47, mean 24, CI 1.91, PVR 4.8 WU.  - Gynecomastia with spironolactone. - Echo (2/20):  EF 20-25%, severe LV enlargement, moderate RV enlargement, moderately decreased RV systolic function.  - CPX (2/21): peak VO2 13.2, VE/VCO2 slope 24, RER 1.10. Severe functional limitation due to OHS with severe restrictive lung disease without significant HF limitation.  2. COPD: Uses oxygen at night. Never smoked, but apparently had significant occupational exposure. It appears that he won a lawsuit dealing with the occupational exposure-related COPD.  3. CAD: Cath in 5/17 with 60% RCA stenosis.  - LHC (5/19): 80% distal LAD, 80% proximal RCA.  DES to proximal RCA.  4. Polycythemia: Probably due to chronic hypoxemia.  5. Type II diabetes.  6. HTN 7. GERD 8. CKD: Stage 3.  Likely due to diabetes and HTN.  9. OSA: Unable to tolerate CPAP.   Review of systems complete and found to be negative unless listed in HPI.    Current Outpatient Medications  Medication Sig Dispense Refill  . acetaminophen (TYLENOL) 650 MG CR tablet Take 650 mg by mouth every 8 (eight) hours as needed for pain.    Marland Kitchen albuterol (PROVENTIL) (2.5 MG/3ML) 0.083% nebulizer solution Inhale into the lungs.    Marland Kitchen allopurinol (ZYLOPRIM) 100 MG tablet Take 1 tablet (100 mg total) by mouth daily. 30 tablet 3  . aspirin 81 MG chewable tablet Chew 81 mg by mouth daily.    Marland Kitchen atorvastatin (LIPITOR) 40 MG tablet TAKE 1 TABLET(40 MG) BY MOUTH DAILY AT 6 PM 90 tablet 3  . bisacodyl (DULCOLAX) 5 MG EC tablet Take  5 mg by mouth daily as needed for moderate constipation.    . digoxin (LANOXIN) 0.125 MG tablet TAKE 1 TABLET(0.125 MG) BY MOUTH DAILY 30 tablet 6  . DULoxetine (CYMBALTA) 60 MG capsule Take 60 mg by mouth daily.  2  . eplerenone (INSPRA) 50 MG tablet Take 1 tablet (50 mg total) by mouth daily. 30 tablet 6  . fenofibrate (TRICOR) 145 MG tablet Take 1 tablet (145 mg total) by mouth daily. 30 tablet 11  . hydrALAZINE (APRESOLINE) 50 MG tablet Take 1 tablet (50 mg total) by mouth 3 (three) times daily. 90 tablet 3  . Insulin Pen  Needle (NOVOFINE) 30G X 8 MM MISC Inject 10 each into the skin as needed. 100 each 0  . insulin regular human CONCENTRATED (HUMULIN R U-500 KWIKPEN) 500 UNIT/ML kwikpen USE AS DIRECTED PER SLIDING SCALE. MAX DAILY DOSE IS 400 UNITS.    Marland Kitchen isosorbide dinitrate (ISORDIL) 20 MG tablet Take 1 tablet (20 mg total) by mouth 3 (three) times daily. 90 tablet 11  . JARDIANCE 25 MG TABS tablet Take 25 mg by mouth daily. 30 tablet 6  . MAGNESIUM-OXIDE 400 (241.3 Mg) MG tablet Take 1 tablet (400 mg total) by mouth daily. 30 tablet 11  . omeprazole (PRILOSEC) 20 MG capsule Take 1 capsule (20 mg total) by mouth daily. 30 capsule 1  . potassium chloride 20 MEQ TBCR As directed by CHF clinic. 10 tablet 0  . pregabalin (LYRICA) 100 MG capsule Take 1 capsule (100 mg total) by mouth 2 (two) times daily. 60 capsule 3  . sacubitril-valsartan (ENTRESTO) 97-103 MG Take 1 tablet by mouth 2 (two) times daily. 180 tablet 3  . torsemide (DEMADEX) 20 MG tablet Take 2 tablets (40 mg total) by mouth daily. 180 tablet 3   No current facility-administered medications for this encounter.   Allergies  Allergen Reactions  . Codeine   . Coconut Oil Itching    Social History   Socioeconomic History  . Marital status: Divorced    Spouse name: Not on file  . Number of children: Not on file  . Years of education: Not on file  . Highest education level: Not on file  Occupational History  . Occupation: disability    Comment: stopped working in 2000 d/t occupational exposures  Tobacco Use  . Smoking status: Former Smoker    Types: Cigarettes  . Smokeless tobacco: Never Used  . Tobacco comment: "smoked when I drank"  Vaping Use  . Vaping Use: Never used  Substance and Sexual Activity  . 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 Drug use: Never  . Sexual activity: Not Currently  Other Topics Concern  . Not on file  Social History Narrative   Patient lives at home  with wife and some of his 10 children. He self-administers his own medications.   Social Determinants of Health   Financial Resource Strain:   . Difficulty of Paying Living Expenses: Not on file  Food Insecurity:   . Worried About Programme researcher, broadcasting/film/video in the Last Year: Not on file  . Ran Out of Food in the Last Year: Not on file  Transportation Needs:   . Lack of Transportation (Medical): Not on file  . Lack of Transportation (Non-Medical): Not on file  Physical Activity:   . Days of Exercise per Week: Not on file  . Minutes of Exercise per Session: Not on file  Stress:   . Feeling of Stress : Not on file  Social Connections:   . Frequency of Communication with Friends and Family: Not on file  . Frequency of Social Gatherings with Friends and Family: Not on file  . Attends Religious Services: Not on file  . Active Member of Clubs or Organizations: Not on file  . Attends Banker Meetings: Not on file  . Marital Status: Not on file  Intimate Partner Violence:   . Fear of Current or Ex-Partner: Not on file  . Emotionally Abused: Not on file  . Physically Abused: Not on file  . Sexually Abused: Not on file   Family History  Problem Relation Age of Onset  . Hypertension Mother   . Diabetes Mother   . Hypertension Father   . Diabetes Father   . Diabetes Sister   . Diabetes Brother    Vitals:   04/15/20 1423  BP: 128/90  Pulse: 69  SpO2: 93%  Weight: 122.9 kg (271 lb)     Wt Readings from Last 3 Encounters:  04/15/20 122.9 kg (271 lb)  04/07/20 120.7 kg (266 lb)  03/31/20 122.5 kg (270 lb)   PHYSICAL EXAM: General:  Well appearing. No resp difficulty HEENT: normal Neck: supple. no JVD. Carotids 2+ bilat; no bruits. No lymphadenopathy or thryomegaly appreciated. Cor: PMI nondisplaced. Regular rate & rhythm. No rubs, gallops or murmurs. Lungs: clear Abdomen: soft, nontender, nondistended. No hepatosplenomegaly. No bruits or masses. Good bowel  sounds. Extremities: no cyanosis, clubbing, rash, edema Neuro: alert & orientedx3, cranial nerves grossly intact. moves all 4 extremities w/o difficulty. Affect pleasant  ASSESSMENT & PLAN:  1. Chronic systolic CHF:  Primarily nonischemic cardiomyopathy. Echo in 2017 with EF 15-20%. Medtronic ICD. Echo 10/2017 EF 15-20%, severely dilated RV with severely decreased systolic function. LHC/RHC 11/06/17 showed volume overload with 80% RCA stenosis.  Cardiac index low at 1.91. The degree of coronary disease does not explain his cardiomyopathy.  Echo in 2/20 showed severe LV dilation, EF 20-25%, moderately decreased RV systolic function.  -NYHA  - Continue torsemide 40 mg daily.  BMET today.  - Continue entresto 97/103 mg BID.  - Continue eplerenone 50 mg daily.   - Continue hydralazine to 50 mg tid and continue isordil 20 mg tid.  - Continue digoxin 0.125 mg daily, check level today.  - Unable to tolerate bisoprolol due to dyspnea.  - Continue empagliflozin.  - Check BMET.  - He has HF and severe peripheral neuropathy.  Neuropathy is likely due to diabetes, but I think it would be reasonable to rule out evidence for amyloidosis. Myeloma panel and urine IFXN negative, awaiting PYP scan.  2. COPD: Never smoked, but apparently had significant occupational exposure. It appears that he won a lawsuit dealing with the occupational exposure-related COPD. CPX in 2/21 showed severe functional limitation, but it appeared to be due to pulmonary abnormalities rather than HF.  - Followup with pulmonary.  3. CAD:  LHC in 5/19 with 80-90% proximal RCA stenosis treated with DES to RCA.  This did not cause his cardiomyopathy but is a large RCA.  No chest pain.  - Continue ASA 81 daily.  - Continue atorvastatin, good LDL in 4/21.  4. Polycythemia: Likely related to chronic hypoxia.  5. OSA: Cannot tolerate CPAP, wears oxygen at night.  6. DM: He is on empagliflozin.   7. Hypertriglyceridemia: Off Vascepa due to  blood in stool that resolved.  -Continue  fenofibrate 145 mg daily.  Follow up 3 months with Dr Shirlee Latch and an ECHO,   Stephani Janak Np-C  04/15/2020

## 2020-04-15 NOTE — Telephone Encounter (Signed)
-----   Message from Sherald Hess, NP sent at 04/15/2020  3:51 PM EDT ----- Please call. Hold torsemide x 1 days the cut back torsemide to 20 mg daily. Please call HF Paramedicine with Zach.

## 2020-04-15 NOTE — Patient Instructions (Signed)
Labs done today, your results will be available in MyChart, we will contact you for abnormal readings.  Your physician has requested that you have an echocardiogram. Echocardiography is a painless test that uses sound waves to create images of your heart. It provides your doctor with information about the size and shape of your heart and how well your heart's chambers and valves are working. This procedure takes approximately one hour. There are no restrictions for this procedure.   Your physician recommends that you return for an office visit with your echocardiogram   If you have any questions or concerns before your next appointment please send [place] a message through [place] or call our office at [phone].    TO LEAVE A MESSAGE FOR THE NURSE SELECT OPTION 2, PLEASE LEAVE A MESSAGE INCLUDING: . YOUR NAME . DATE OF BIRTH . CALL BACK NUMBER . REASON FOR CALL**this is important as we prioritize the call backs  YOU WILL RECEIVE A CALL BACK THE SAME DAY AS LONG AS YOU CALL BEFORE 4:00 PM  At the [facility], you and your health needs are our priority. As part of our continuing mission to provide you with exceptional heart care, we have created designated Provider Care Teams. These Care Teams include your primary Cardiologist (physician) and Advanced Practice Providers (APPs- Physician Assistants and Nurse Practitioners) who all work together to provide you with the care you need, when you need it.   You may see any of the following providers on your designated Care Team at your next follow up: Marland Kitchen Dr Arvilla Meres . Dr Marca Ancona . Tonye Becket, NP . Robbie Lis, PA . Karle Plumber, PharmD   Please be sure to bring in all your medications bottles to every appointment.

## 2020-04-15 NOTE — Telephone Encounter (Signed)
Samara Snide, RN  04/15/2020 4:06 PM EDT Back to Top    Paramedic Zach aware, will visit patient today to update med box and advise patient. Med list updated

## 2020-04-20 ENCOUNTER — Telehealth (HOSPITAL_COMMUNITY): Payer: Self-pay

## 2020-04-20 NOTE — Telephone Encounter (Signed)
I called Mr Richard [name] to schedule an appointment. He stated he would be available tomorrow morning so we agreed to meet between 10:00 and 10:30.  [name], EMT 04/20/20

## 2020-04-21 ENCOUNTER — Other Ambulatory Visit (HOSPITAL_COMMUNITY): Payer: Self-pay

## 2020-04-21 NOTE — Progress Notes (Signed)
Paramedicine Encounter    Patient ID: Richard [name], male    DOB: 06/20/1961, 59 y.o.   MRN: [id number]   Patient Care Team: Ananias Pilgrim, MD as PCP - General (Family Medicine) Laurey Morale, MD as PCP - Advanced Heart Failure (Cardiology) Burna Sis, LCSW as Social Worker (Licensed Clinical Social Worker)  Patient Active Problem List   Diagnosis Date Noted   Acute on chronic combined systolic and diastolic CHF (congestive heart failure) (HCC) 03/15/2018   S/P right and left heart catheterization 11/02/2017   Panic attacks 11/02/2017   Non-STEMI (non-ST elevated myocardial infarction) (HCC)    COPD (chronic obstructive pulmonary disease) (HCC)    Hypertension    CKD (chronic kidney disease)    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HCC)    ICD (implantable cardioverter-defibrillator) in place    GERD (gastroesophageal reflux disease)    Diabetes mellitus with diabetic neuropathy, with long-term current use of insulin (HCC)     Current Outpatient Medications:    acetaminophen (TYLENOL) 650 MG CR tablet, Take 650 mg by mouth every 8 (eight) hours as needed for pain., Disp: , Rfl:    albuterol (PROVENTIL) (2.5 MG/3ML) 0.083% nebulizer solution, Inhale into the lungs., Disp: , Rfl:    allopurinol (ZYLOPRIM) 100 MG tablet, Take 1 tablet (100 mg total) by mouth daily., Disp: 30 tablet, Rfl: 3   aspirin 81 MG chewable tablet, Chew 81 mg by mouth daily., Disp: , Rfl:    atorvastatin (LIPITOR) 40 MG tablet, TAKE 1 TABLET(40 MG) BY MOUTH DAILY AT 6 PM, Disp: 90 tablet, Rfl: 3   bisacodyl (DULCOLAX) 5 MG EC tablet, Take 5 mg by mouth daily as needed for moderate constipation., Disp: , Rfl:    digoxin (LANOXIN) 0.125 MG tablet, TAKE 1 TABLET(0.125 MG) BY MOUTH DAILY, Disp: 30 tablet, Rfl: 6   DULoxetine (CYMBALTA) 60 MG capsule, Take 60 mg by mouth daily., Disp: , Rfl: 2   eplerenone (INSPRA) 50 MG tablet, Take 1 tablet (50 mg total) by mouth daily., Disp: 30 tablet, Rfl: 6    fenofibrate (TRICOR) 145 MG tablet, Take 1 tablet (145 mg total) by mouth daily., Disp: 30 tablet, Rfl: 11   hydrALAZINE (APRESOLINE) 50 MG tablet, Take 1 tablet (50 mg total) by mouth 3 (three) times daily., Disp: 90 tablet, Rfl: 3   Insulin Pen Needle (NOVOFINE) 30G X 8 MM MISC, Inject 10 each into the skin as needed., Disp: 100 each, Rfl: 0   insulin regular human CONCENTRATED (HUMULIN R U-500 KWIKPEN) 500 UNIT/ML kwikpen, USE AS DIRECTED PER SLIDING SCALE. MAX DAILY DOSE IS 400 UNITS., Disp: , Rfl:    isosorbide dinitrate (ISORDIL) 20 MG tablet, Take 1 tablet (20 mg total) by mouth 3 (three) times daily., Disp: 90 tablet, Rfl: 11   JARDIANCE 25 MG TABS tablet, Take 25 mg by mouth daily., Disp: 30 tablet, Rfl: 6   MAGNESIUM-OXIDE 400 (241.3 Mg) MG tablet, Take 1 tablet (400 mg total) by mouth daily., Disp: 30 tablet, Rfl: 11   omeprazole (PRILOSEC) 20 MG capsule, Take 1 capsule (20 mg total) by mouth daily., Disp: 30 capsule, Rfl: 1   potassium chloride 20 MEQ TBCR, As directed by CHF clinic. (Patient not taking: Reported on 04/15/2020), Disp: 10 tablet, Rfl: 0   pregabalin (LYRICA) 100 MG capsule, Take 1 capsule (100 mg total) by mouth 2 (two) times daily., Disp: 60 capsule, Rfl: 3   sacubitril-valsartan (ENTRESTO) 97-103 MG, Take 1 tablet by mouth 2 (two) times daily., Disp:  180 tablet, Rfl: 3   torsemide (DEMADEX) 20 MG tablet, Take 1 tablet (20 mg total) by mouth daily., Disp: 60 tablet, Rfl: 3 Allergies  Allergen Reactions   Codeine    Coconut Oil Itching      Social History   Socioeconomic History   Marital status: Divorced    Spouse name: Not on file   Number of children: Not on file   Years of education: Not on file   Highest education level: Not on file  Occupational History   Occupation: disability    Comment: stopped working in 2000 d/t occupational exposures  Tobacco Use   Smoking status: Former Smoker    Types: Cigarettes   Smokeless tobacco:  Never Used   Tobacco comment: "smoked when I drank"  Vaping Use   Vaping Use: Never used  Substance and Sexual Activity   Alcohol use: Not Currently    Comment: 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  Other Topics Concern   Not on file  Social History Narrative   Patient lives at home with wife and some of his 10 children. He self-administers his own medications.   Social Determinants of Health   Financial Resource Strain:    Difficulty of Paying Living Expenses: Not on file  Food Insecurity:    Worried About Programme researcher, broadcasting/film/video in the Last Year: Not on file   [employer] of Food in the Last Year: Not on file  Transportation Needs:    Lack of Transportation (Medical): Not on file   Lack of Transportation (Non-Medical): Not on file  Physical Activity:    Days of Exercise per Week: Not on file   Minutes of Exercise per Session: Not on file  Stress:    Feeling of Stress : Not on file  Social Connections:    Frequency of Communication with Friends and Family: Not on file   Frequency of Social Gatherings with Friends and Family: Not on file   Attends Religious Services: Not on file   Active Member of Clubs or Organizations: Not on file   Attends Banker Meetings: Not on file   Marital Status: Not on file  Intimate Partner Violence:    Fear of Current or Ex-Partner: Not on file   Emotionally Abused: Not on file   Physically Abused: Not on file   Sexually Abused: Not on file    Physical Exam Cardiovascular:     Rate and Rhythm: Normal rate and regular rhythm.  Pulmonary:     Effort: Pulmonary effort is normal.     Breath sounds: Normal breath sounds.  Musculoskeletal:        General: Normal range of motion.     Right lower leg: No edema.     Left lower leg: No edema.  Skin:    General: Skin is warm and dry.     Capillary Refill: Capillary refill takes less than 2 seconds.   Neurological:     Mental Status: He is alert and oriented to person, place, and time.  Psychiatric:        Mood and Affect: Mood normal.         Future Appointments  Date Time Provider Department Center  06/01/2020  8:10 AM [facility] DEVICE REMOTES [facility]  07/08/2020  1:00 PM [facility] ECHO OP 1 [facility]  07/08/2020  2:00 PM Laurey Morale, MD [facility] None  08/31/2020  8:10 AM [facility] DEVICE REMOTES [facility]  11/30/2020  8:10 AM [facility] DEVICE REMOTES [facility]  03/01/2021  8:10 AM [facility] DEVICE REMOTES [facility]  05/31/2021  8:10 AM [facility] DEVICE REMOTES [facility]  08/30/2021  8:10 AM [facility] DEVICE REMOTES [facility]  11/29/2021  8:10 AM [facility] DEVICE REMOTES [facility]    BP 110/77 (BP Location: Left Arm, Patient Position: Sitting, Cuff Size: Large)    Pulse 80    Resp 16    Wt 268 lb (121.6 kg)    SpO2 90%    BMI 31.78 kg/m   Weight yesterday- 269 lb Last visit weight- 271 lb  Mr Goldwater was seen at home today and reported feeling well. He denied chest pain, SOB, headache, dizziness, orthopnea, fever or cough over the past week. He reported being compliant with his medications however by looking at his pillbox it appeared he had missed one full day. His weight has been stable even with the decrease in torsemide. His medications were verified and his pillbox was refilled. I will follow up next week.   [name], EMT 04/21/20  ACTION: Home visit completed Next visit planned for 1 week

## 2020-04-28 ENCOUNTER — Other Ambulatory Visit (HOSPITAL_COMMUNITY): Payer: Self-pay

## 2020-04-28 ENCOUNTER — Other Ambulatory Visit (HOSPITAL_COMMUNITY): Payer: Self-pay | Admitting: *Deleted

## 2020-04-28 MED ORDER — DIGOXIN 125 MCG PO TABS: ORAL_TABLET | ORAL | 6 refills | Status: DC

## 2020-04-28 NOTE — Progress Notes (Signed)
Paramedicine Encounter    Patient ID: Richard [name], male    DOB: Jan 28, 1961, 59 y.o.   MRN: [id number]   Patient Care Team: Ananias Pilgrim, MD as PCP - General (Family Medicine) Laurey Morale, MD as PCP - Advanced Heart Failure (Cardiology) Burna Sis, LCSW as Social Worker (Licensed Clinical Social Worker)  Patient Active Problem List   Diagnosis Date Noted  . Acute on chronic combined systolic and diastolic CHF (congestive heart failure) (HCC) 03/15/2018  . S/P right and left heart catheterization 11/02/2017  . Panic attacks 11/02/2017  . Non-STEMI (non-ST elevated myocardial infarction) (HCC)   . COPD (chronic obstructive pulmonary disease) (HCC)   . Hypertension   . CKD (chronic kidney disease)   . 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HCC)   . ICD (implantable cardioverter-defibrillator) in place   . GERD (gastroesophageal reflux disease)   . Diabetes mellitus with diabetic neuropathy, with long-term current use of insulin (HCC)     Current Outpatient Medications:  .  acetaminophen (TYLENOL) 650 MG CR tablet, Take 650 mg by mouth every 8 (eight) hours as needed for pain., Disp: , Rfl:  .  albuterol (PROVENTIL) (2.5 MG/3ML) 0.083% nebulizer solution, Inhale into the lungs., Disp: , Rfl:  .  allopurinol (ZYLOPRIM) 100 MG tablet, Take 1 tablet (100 mg total) by mouth daily., Disp: 30 tablet, Rfl: 3 .  aspirin 81 MG chewable tablet, Chew 81 mg by mouth daily., Disp: , Rfl:  .  atorvastatin (LIPITOR) 40 MG tablet, TAKE 1 TABLET(40 MG) BY MOUTH DAILY AT 6 PM, Disp: 90 tablet, Rfl: 3 .  bisacodyl (DULCOLAX) 5 MG EC tablet, Take 5 mg by mouth daily as needed for moderate constipation., Disp: , Rfl:  .  digoxin (LANOXIN) 0.125 MG tablet, TAKE 1 TABLET(0.125 MG) BY MOUTH DAILY, Disp: 30 tablet, Rfl: 6 .  DULoxetine (CYMBALTA) 60 MG capsule, Take 60 mg by mouth daily., Disp: , Rfl: 2 .  eplerenone (INSPRA) 50 MG tablet, Take 1 tablet (50 mg total) by mouth daily., Disp: 30 tablet, Rfl: 6 .   fenofibrate (TRICOR) 145 MG tablet, Take 1 tablet (145 mg total) by mouth daily., Disp: 30 tablet, Rfl: 11 .  hydrALAZINE (APRESOLINE) 50 MG tablet, Take 1 tablet (50 mg total) by mouth 3 (three) times daily., Disp: 90 tablet, Rfl: 3 .  Insulin Pen Needle (NOVOFINE) 30G X 8 MM MISC, Inject 10 each into the skin as needed., Disp: 100 each, Rfl: 0 .  insulin regular human CONCENTRATED (HUMULIN R U-500 KWIKPEN) 500 UNIT/ML kwikpen, USE AS DIRECTED PER SLIDING SCALE. MAX DAILY DOSE IS 400 UNITS., Disp: , Rfl:  .  isosorbide dinitrate (ISORDIL) 20 MG tablet, Take 1 tablet (20 mg total) by mouth 3 (three) times daily., Disp: 90 tablet, Rfl: 11 .  JARDIANCE 25 MG TABS tablet, Take 25 mg by mouth daily., Disp: 30 tablet, Rfl: 6 .  MAGNESIUM-OXIDE 400 (241.3 Mg) MG tablet, Take 1 tablet (400 mg total) by mouth daily., Disp: 30 tablet, Rfl: 11 .  omeprazole (PRILOSEC) 20 MG capsule, Take 1 capsule (20 mg total) by mouth daily., Disp: 30 capsule, Rfl: 1 .  potassium chloride 20 MEQ TBCR, As directed by CHF clinic. (Patient not taking: Reported on 04/15/2020), Disp: 10 tablet, Rfl: 0 .  pregabalin (LYRICA) 100 MG capsule, Take 1 capsule (100 mg total) by mouth 2 (two) times daily., Disp: 60 capsule, Rfl: 3 .  sacubitril-valsartan (ENTRESTO) 97-103 MG, Take 1 tablet by mouth 2 (two) times daily., Disp:  180 tablet, Rfl: 3 .  torsemide (DEMADEX) 20 MG tablet, Take 1 tablet (20 mg total) by mouth daily., Disp: 60 tablet, Rfl: 3 Allergies  Allergen Reactions  . Codeine   . Coconut Oil Itching      Social History   Socioeconomic History  . Marital status: Divorced    Spouse name: Not on file  . Number of children: Not on file  . Years of education: Not on file  . Highest education level: Not on file  Occupational History  . Occupation: disability    Comment: stopped working in 2000 d/t occupational exposures  Tobacco Use  . Smoking status: Former Smoker    Types: Cigarettes  . Smokeless tobacco:  Never Used  . Tobacco comment: "smoked when I drank"  Vaping Use  . Vaping Use: Never used  Substance and Sexual Activity  . 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 Drug use: Never  . Sexual activity: Not Currently  Other Topics Concern  . Not on file  Social History Narrative   Patient lives at home with wife and some of his 10 children. He self-administers his own medications.   Social Determinants of Health   Financial Resource Strain:   . Difficulty of Paying Living Expenses: Not on file  Food Insecurity:   . Worried About Programme researcher, broadcasting/film/video in the Last Year: Not on file  . Ran Out of Food in the Last Year: Not on file  Transportation Needs:   . Lack of Transportation (Medical): Not on file  . Lack of Transportation (Non-Medical): Not on file  Physical Activity:   . Days of Exercise per Week: Not on file  . Minutes of Exercise per Session: Not on file  Stress:   . Feeling of Stress : Not on file  Social Connections:   . Frequency of Communication with Friends and Family: Not on file  . Frequency of Social Gatherings with Friends and Family: Not on file  . Attends Religious Services: Not on file  . Active Member of Clubs or Organizations: Not on file  . Attends Banker Meetings: Not on file  . Marital Status: Not on file  Intimate Partner Violence:   . Fear of Current or Ex-Partner: Not on file  . Emotionally Abused: Not on file  . Physically Abused: Not on file  . Sexually Abused: Not on file    Physical Exam Cardiovascular:     Rate and Rhythm: Normal rate and regular rhythm.     Pulses: Normal pulses.  Pulmonary:     Effort: Pulmonary effort is normal.     Breath sounds: Normal breath sounds.  Musculoskeletal:        General: Normal range of motion.     Right lower leg: Edema present.     Left lower leg: Edema present.  Skin:    Capillary Refill: Capillary refill takes less than 2  seconds.  Neurological:     Mental Status: He is alert and oriented to person, place, and time.  Psychiatric:        Mood and Affect: Mood normal.         Future Appointments  Date Time Provider Department Center  04/29/2020  2:15 PM [facility] None  06/01/2020  8:10 AM [facility] DEVICE REMOTES [facility]  07/08/2020  1:00 PM [facility] E[facility] OP 1 [facility]  07/08/2020  2:00 PM Marca Ancona  S, MD [facility] None  08/31/2020  8:10 AM [facility] DEVICE REMOTES [facility]  11/30/2020  8:10 AM [facility] DEVICE REMOTES [facility]  03/01/2021  8:10 AM [facility] DEVICE REMOTES [facility]  05/31/2021  8:10 AM [facility] DEVICE REMOTES [facility]  08/30/2021  8:10 AM [facility] DEVICE REMOTES [facility]  11/29/2021  8:10 AM [facility] DEVICE REMOTES [facility]    BP (!) 133/93 (BP Location: Left Arm, Patient Position: Sitting, Cuff Size: Large)   Pulse 80   Resp 18   Wt 270 lb (122.5 kg)   SpO2 93%   BMI 32.02 kg/m   Weight yesterday- 267 lb Last visit weight- 268 lb  Richard [name] was seen at home today and reported feeling well. He denied chest pain, SOB, headache dizziness, orthopnea, fever or cough over the past week. He stated he has been compliant with his medications over the past week and his weight has been generally stable. His medications were verified and his pillbox was refilled. I will follow up next week.   [name], EMT 04/28/20  ACTION: Home visit completed Next visit planned for 1 week

## 2020-04-29 ENCOUNTER — Ambulatory Visit (HOSPITAL_COMMUNITY)
Admission: RE | Admit: 2020-04-29 | Discharge: 2020-04-29 | Disposition: A | Discharge status: Home / Self Care | Payer: Medicare Other | Source: Ambulatory Visit | Attending: Internal Medicine | Admitting: Internal Medicine

## 2020-04-29 ENCOUNTER — Other Ambulatory Visit: Payer: Self-pay

## 2020-04-29 DIAGNOSIS — I5022 Chronic systolic (congestive) heart failure: Secondary | ICD-10-CM | POA: Insufficient documentation

## 2020-04-29 LAB — LIPID PANEL
Cholesterol: 98 mg/dL (ref 0–200)
HDL: 27 mg/dL — ABNORMAL LOW (ref >40)
LDL Cholesterol: 42 mg/dL (ref 0–99)
Total CHOL/HDL Ratio: 3.6 RATIO
Triglycerides: 144 mg/dL (ref <150)
VLDL: 29 mg/dL (ref 0–40)

## 2020-04-29 LAB — BASIC METABOLIC PANEL
Anion gap: 10 (ref 5–15)
BUN: 22 mg/dL — ABNORMAL HIGH (ref 6–20)
CO2: 34 mmol/L — ABNORMAL HIGH (ref 22–32)
Calcium: 9.3 mg/dL (ref 8.9–10.3)
Chloride: 99 mmol/L (ref 98–111)
Creatinine, Ser: 1.81 mg/dL — ABNORMAL HIGH (ref 0.61–1.24)
GFR, Estimated: 43 mL/min — ABNORMAL LOW (ref >60)
Glucose, Bld: 125 mg/dL — ABNORMAL HIGH (ref 70–99)
Potassium: 4.3 mmol/L (ref 3.5–5.1)
Sodium: 143 mmol/L (ref 135–145)

## 2020-05-05 ENCOUNTER — Other Ambulatory Visit (HOSPITAL_COMMUNITY): Payer: Self-pay

## 2020-05-05 NOTE — Progress Notes (Signed)
Paramedicine Encounter    Patient ID: Richard [name], male    DOB: 06/20/1961, 59 y.o.   MRN: [id number]   Patient Care Team: Ananias Pilgrim, MD as PCP - General (Family Medicine) Laurey Morale, MD as PCP - Advanced Heart Failure (Cardiology) Burna Sis, LCSW as Social Worker (Licensed Clinical Social Worker)  Patient Active Problem List   Diagnosis Date Noted   Acute on chronic combined systolic and diastolic CHF (congestive heart failure) (HCC) 03/15/2018   S/P right and left heart catheterization 11/02/2017   Panic attacks 11/02/2017   Non-STEMI (non-ST elevated myocardial infarction) (HCC)    COPD (chronic obstructive pulmonary disease) (HCC)    Hypertension    CKD (chronic kidney disease)    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HCC)    ICD (implantable cardioverter-defibrillator) in place    GERD (gastroesophageal reflux disease)    Diabetes mellitus with diabetic neuropathy, with long-term current use of insulin (HCC)     Current Outpatient Medications:    acetaminophen (TYLENOL) 650 MG CR tablet, Take 650 mg by mouth every 8 (eight) hours as needed for pain., Disp: , Rfl:    albuterol (PROVENTIL) (2.5 MG/3ML) 0.083% nebulizer solution, Inhale into the lungs., Disp: , Rfl:    allopurinol (ZYLOPRIM) 100 MG tablet, Take 1 tablet (100 mg total) by mouth daily., Disp: 30 tablet, Rfl: 3   aspirin 81 MG chewable tablet, Chew 81 mg by mouth daily., Disp: , Rfl:    atorvastatin (LIPITOR) 40 MG tablet, TAKE 1 TABLET(40 MG) BY MOUTH DAILY AT 6 PM, Disp: 90 tablet, Rfl: 3   bisacodyl (DULCOLAX) 5 MG EC tablet, Take 5 mg by mouth daily as needed for moderate constipation., Disp: , Rfl:    digoxin (LANOXIN) 0.125 MG tablet, TAKE 1 TABLET(0.125 MG) BY MOUTH DAILY, Disp: 30 tablet, Rfl: 6   DULoxetine (CYMBALTA) 60 MG capsule, Take 60 mg by mouth daily., Disp: , Rfl: 2   eplerenone (INSPRA) 50 MG tablet, Take 1 tablet (50 mg total) by mouth daily., Disp: 30 tablet, Rfl: 6    fenofibrate (TRICOR) 145 MG tablet, Take 1 tablet (145 mg total) by mouth daily., Disp: 30 tablet, Rfl: 11   hydrALAZINE (APRESOLINE) 50 MG tablet, Take 1 tablet (50 mg total) by mouth 3 (three) times daily., Disp: 90 tablet, Rfl: 3   Insulin Pen Needle (NOVOFINE) 30G X 8 MM MISC, Inject 10 each into the skin as needed., Disp: 100 each, Rfl: 0   insulin regular human CONCENTRATED (HUMULIN R U-500 KWIKPEN) 500 UNIT/ML kwikpen, USE AS DIRECTED PER SLIDING SCALE. MAX DAILY DOSE IS 400 UNITS., Disp: , Rfl:    isosorbide dinitrate (ISORDIL) 20 MG tablet, Take 1 tablet (20 mg total) by mouth 3 (three) times daily., Disp: 90 tablet, Rfl: 11   JARDIANCE 25 MG TABS tablet, Take 25 mg by mouth daily., Disp: 30 tablet, Rfl: 6   MAGNESIUM-OXIDE 400 (241.3 Mg) MG tablet, Take 1 tablet (400 mg total) by mouth daily., Disp: 30 tablet, Rfl: 11   omeprazole (PRILOSEC) 20 MG capsule, Take 1 capsule (20 mg total) by mouth daily., Disp: 30 capsule, Rfl: 1   potassium chloride 20 MEQ TBCR, As directed by CHF clinic. (Patient not taking: Reported on 04/15/2020), Disp: 10 tablet, Rfl: 0   pregabalin (LYRICA) 100 MG capsule, Take 1 capsule (100 mg total) by mouth 2 (two) times daily., Disp: 60 capsule, Rfl: 3   sacubitril-valsartan (ENTRESTO) 97-103 MG, Take 1 tablet by mouth 2 (two) times daily., Disp:  180 tablet, Rfl: 3   torsemide (DEMADEX) 20 MG tablet, Take 1 tablet (20 mg total) by mouth daily., Disp: 60 tablet, Rfl: 3 Allergies  Allergen Reactions   Codeine    Coconut Oil Itching      Social History   Socioeconomic History   Marital status: Divorced    Spouse name: Not on file   Number of children: Not on file   Years of education: Not on file   Highest education level: Not on file  Occupational History   Occupation: disability    Comment: stopped working in 2000 d/t occupational exposures  Tobacco Use   Smoking status: Former Smoker    Types: Cigarettes   Smokeless tobacco:  Never Used   Tobacco comment: "smoked when I drank"  Vaping Use   Vaping Use: Never used  Substance and Sexual Activity   Alcohol use: Not Currently    Comment: 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  Other Topics Concern   Not on file  Social History Narrative   Patient lives at home with wife and some of his 10 children. He self-administers his own medications.   Social Determinants of Health   Financial Resource Strain:    Difficulty of Paying Living Expenses: Not on file  Food Insecurity:    Worried About Programme researcher, broadcasting/film/video in the Last Year: Not on file   [employer] of Food in the Last Year: Not on file  Transportation Needs:    Lack of Transportation (Medical): Not on file   Lack of Transportation (Non-Medical): Not on file  Physical Activity:    Days of Exercise per Week: Not on file   Minutes of Exercise per Session: Not on file  Stress:    Feeling of Stress : Not on file  Social Connections:    Frequency of Communication with Friends and Family: Not on file   Frequency of Social Gatherings with Friends and Family: Not on file   Attends Religious Services: Not on file   Active Member of Clubs or Organizations: Not on file   Attends Banker Meetings: Not on file   Marital Status: Not on file  Intimate Partner Violence:    Fear of Current or Ex-Partner: Not on file   Emotionally Abused: Not on file   Physically Abused: Not on file   Sexually Abused: Not on file    Physical Exam Cardiovascular:     Rate and Rhythm: Normal rate and regular rhythm.     Pulses: Normal pulses.  Pulmonary:     Effort: Pulmonary effort is normal.     Breath sounds: Normal breath sounds.  Musculoskeletal:        General: Normal range of motion.     Right lower leg: No edema.     Left lower leg: No edema.  Skin:    General: Skin is warm and dry.     Capillary Refill: Capillary  refill takes less than 2 seconds.  Neurological:     Mental Status: He is alert and oriented to person, place, and time.  Psychiatric:        Mood and Affect: Mood normal.         Future Appointments  Date Time Provider Department Center  06/01/2020  8:10 AM [facility] DEVICE REMOTES [facility]  07/08/2020  1:00 PM [facility] E[facility] OP 1 [facility]  07/08/2020  2:00 PM Shirlee Latch,  Eliot Ford, MD [facility] None  08/31/2020  8:10 AM [facility] DEVICE REMOTES [facility]  11/30/2020  8:10 AM [facility] DEVICE REMOTES [facility]  03/01/2021  8:10 AM [facility] DEVICE REMOTES [facility]  05/31/2021  8:10 AM [facility] DEVICE REMOTES [facility]  08/30/2021  8:10 AM [facility] DEVICE REMOTES [facility]  11/29/2021  8:10 AM [facility] DEVICE REMOTES [facility]    BP 109/74 (BP Location: Left Arm, Patient Position: Sitting, Cuff Size: Large)    Pulse 78    Resp 16    Wt 270 lb (122.5 kg)    SpO2 90%    BMI 32.02 kg/m   Weight yesterday- 270 lb Last visit weight- 270 lb  Mr Smock was seen at home today and reported feeling well. He denied any episodes of chest pain, SOB, headache, dizziness, orthopnea, fever or cough over the past week. He stated he has been compliant with his medications over the past week and his weight has been stable. His medications were verified and his pillbox was refilled. He did not have enough isosorbide or torsemide to completely fill his box but both were ready for pick up at the pharmacy. He stated he would pick them up and finish his box this evening. I will follow up next week.   [name], EMT 05/05/20  ACTION: Home visit completed Next visit planned for 1 week

## 2020-05-12 ENCOUNTER — Other Ambulatory Visit (HOSPITAL_COMMUNITY): Payer: Self-pay

## 2020-05-12 ENCOUNTER — Other Ambulatory Visit (HOSPITAL_COMMUNITY): Payer: Self-pay | Admitting: Cardiology

## 2020-05-12 NOTE — Progress Notes (Signed)
Paramedicine Encounter    Patient ID: Richard [name], male    DOB: 06/20/1961, 59 y.o.   MRN: [id number]   Patient Care Team: Ananias Pilgrim, MD as PCP - General (Family Medicine) Laurey Morale, MD as PCP - Advanced Heart Failure (Cardiology) Burna Sis, LCSW as Social Worker (Licensed Clinical Social Worker)  Patient Active Problem List   Diagnosis Date Noted   Acute on chronic combined systolic and diastolic CHF (congestive heart failure) (HCC) 03/15/2018   S/P right and left heart catheterization 11/02/2017   Panic attacks 11/02/2017   Non-STEMI (non-ST elevated myocardial infarction) (HCC)    COPD (chronic obstructive pulmonary disease) (HCC)    Hypertension    CKD (chronic kidney disease)    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HCC)    ICD (implantable cardioverter-defibrillator) in place    GERD (gastroesophageal reflux disease)    Diabetes mellitus with diabetic neuropathy, with long-term current use of insulin (HCC)     Current Outpatient Medications:    acetaminophen (TYLENOL) 650 MG CR tablet, Take 650 mg by mouth every 8 (eight) hours as needed for pain., Disp: , Rfl:    albuterol (PROVENTIL) (2.5 MG/3ML) 0.083% nebulizer solution, Inhale into the lungs., Disp: , Rfl:    allopurinol (ZYLOPRIM) 100 MG tablet, Take 1 tablet (100 mg total) by mouth daily., Disp: 30 tablet, Rfl: 3   aspirin 81 MG chewable tablet, Chew 81 mg by mouth daily., Disp: , Rfl:    atorvastatin (LIPITOR) 40 MG tablet, TAKE 1 TABLET(40 MG) BY MOUTH DAILY AT 6 PM, Disp: 90 tablet, Rfl: 3   bisacodyl (DULCOLAX) 5 MG EC tablet, Take 5 mg by mouth daily as needed for moderate constipation., Disp: , Rfl:    digoxin (LANOXIN) 0.125 MG tablet, TAKE 1 TABLET(0.125 MG) BY MOUTH DAILY, Disp: 30 tablet, Rfl: 6   DULoxetine (CYMBALTA) 60 MG capsule, Take 60 mg by mouth daily., Disp: , Rfl: 2   eplerenone (INSPRA) 50 MG tablet, Take 1 tablet (50 mg total) by mouth daily., Disp: 30 tablet, Rfl: 6    fenofibrate (TRICOR) 145 MG tablet, Take 1 tablet (145 mg total) by mouth daily., Disp: 30 tablet, Rfl: 11   hydrALAZINE (APRESOLINE) 50 MG tablet, Take 1 tablet (50 mg total) by mouth 3 (three) times daily., Disp: 90 tablet, Rfl: 3   Insulin Pen Needle (NOVOFINE) 30G X 8 MM MISC, Inject 10 each into the skin as needed., Disp: 100 each, Rfl: 0   insulin regular human CONCENTRATED (HUMULIN R U-500 KWIKPEN) 500 UNIT/ML kwikpen, USE AS DIRECTED PER SLIDING SCALE. MAX DAILY DOSE IS 400 UNITS., Disp: , Rfl:    isosorbide dinitrate (ISORDIL) 20 MG tablet, Take 1 tablet (20 mg total) by mouth 3 (three) times daily., Disp: 90 tablet, Rfl: 11   JARDIANCE 25 MG TABS tablet, Take 25 mg by mouth daily., Disp: 30 tablet, Rfl: 6   MAGNESIUM-OXIDE 400 (241.3 Mg) MG tablet, Take 1 tablet (400 mg total) by mouth daily., Disp: 30 tablet, Rfl: 11   omeprazole (PRILOSEC) 20 MG capsule, Take 1 capsule (20 mg total) by mouth daily., Disp: 30 capsule, Rfl: 1   potassium chloride 20 MEQ TBCR, As directed by CHF clinic. (Patient not taking: Reported on 04/15/2020), Disp: 10 tablet, Rfl: 0   pregabalin (LYRICA) 100 MG capsule, Take 1 capsule (100 mg total) by mouth 2 (two) times daily., Disp: 60 capsule, Rfl: 3   sacubitril-valsartan (ENTRESTO) 97-103 MG, Take 1 tablet by mouth 2 (two) times daily., Disp:  180 tablet, Rfl: 3   torsemide (DEMADEX) 20 MG tablet, Take 1 tablet (20 mg total) by mouth daily., Disp: 60 tablet, Rfl: 3 Allergies  Allergen Reactions   Codeine    Coconut Oil Itching      Social History   Socioeconomic History   Marital status: Divorced    Spouse name: Not on file   Number of children: Not on file   Years of education: Not on file   Highest education level: Not on file  Occupational History   Occupation: disability    Comment: stopped working in 2000 d/t occupational exposures  Tobacco Use   Smoking status: Former Smoker    Types: Cigarettes   Smokeless tobacco:  Never Used   Tobacco comment: "smoked when I drank"  Vaping Use   Vaping Use: Never used  Substance and Sexual Activity   Alcohol use: Not Currently    Comment: 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  Other Topics Concern   Not on file  Social History Narrative   Patient lives at home with wife and some of his 10 children. He self-administers his own medications.   Social Determinants of Health   Financial Resource Strain:    Difficulty of Paying Living Expenses: Not on file  Food Insecurity:    Worried About Programme researcher, broadcasting/film/video in the Last Year: Not on file   [employer] of Food in the Last Year: Not on file  Transportation Needs:    Lack of Transportation (Medical): Not on file   Lack of Transportation (Non-Medical): Not on file  Physical Activity:    Days of Exercise per Week: Not on file   Minutes of Exercise per Session: Not on file  Stress:    Feeling of Stress : Not on file  Social Connections:    Frequency of Communication with Friends and Family: Not on file   Frequency of Social Gatherings with Friends and Family: Not on file   Attends Religious Services: Not on file   Active Member of Clubs or Organizations: Not on file   Attends Banker Meetings: Not on file   Marital Status: Not on file  Intimate Partner Violence:    Fear of Current or Ex-Partner: Not on file   Emotionally Abused: Not on file   Physically Abused: Not on file   Sexually Abused: Not on file    Physical Exam Cardiovascular:     Rate and Rhythm: Normal rate and regular rhythm.     Pulses: Normal pulses.  Pulmonary:     Effort: Pulmonary effort is normal.     Breath sounds: Normal breath sounds.  Musculoskeletal:        General: Normal range of motion.     Right lower leg: No edema.     Left lower leg: No edema.  Skin:    General: Skin is warm and dry.     Capillary Refill: Capillary  refill takes less than 2 seconds.  Neurological:     Mental Status: He is alert and oriented to person, place, and time.  Psychiatric:        Mood and Affect: Mood normal.         Future Appointments  Date Time Provider Department Center  06/01/2020  8:10 AM [facility] DEVICE REMOTES [facility]  07/08/2020  1:00 PM [facility] OP 1 [facility]  07/08/2020  2:00 PM Shirlee Latch,  Eliot Ford, MD [facility] None  08/31/2020  8:10 AM [facility] DEVICE REMOTES [facility]  11/30/2020  8:10 AM [facility] DEVICE REMOTES [facility]  03/01/2021  8:10 AM [facility] DEVICE REMOTES [facility]  05/31/2021  8:10 AM [facility] DEVICE REMOTES [facility]  08/30/2021  8:10 AM [facility] DEVICE REMOTES [facility]  11/29/2021  8:10 AM [facility] DEVICE REMOTES [facility]    BP 116/77 (BP Location: Left Arm, Patient Position: Sitting, Cuff Size: Large)    Pulse 74    Resp 16    Wt 269 lb (122 kg)    SpO2 94%    BMI 31.90 kg/m   Weight yesterday- 269 lb Last visit weight- 270 lb  Mr Blixt was seen at home today and reported feeling well. He denied chest pain, SOB/PND, orthopnea, dizziness, headache, cough or fever over the past week. His medications that were ordered last week were not filled by the pharmacy however I spoke with them today and they advised they would have them ready by this afternoon. I left written instructions with Mr Sahakian on where to add his pillbox when he picks them up. All other medications were accounted for and placed in his pillbox. I will follow up next week.   [name], EMT 05/12/20  ACTION: Home visit completed Next visit planned for 1 week

## 2020-05-26 ENCOUNTER — Other Ambulatory Visit (HOSPITAL_COMMUNITY): Payer: Self-pay

## 2020-05-26 NOTE — Progress Notes (Signed)
Paramedicine Encounter    Patient ID: Richard [name], male    DOB: March 26, 1961, 59 y.o.   MRN: [id number]   Patient Care Team: Ananias Pilgrim, MD as PCP - General (Family Medicine) Laurey Morale, MD as PCP - Advanced Heart Failure (Cardiology) Burna Sis, LCSW as Social Worker (Licensed Clinical Social Worker)  Patient Active Problem List   Diagnosis Date Noted  . Acute on chronic combined systolic and diastolic CHF (congestive heart failure) (HCC) 03/15/2018  . S/P right and left heart catheterization 11/02/2017  . Panic attacks 11/02/2017  . Non-STEMI (non-ST elevated myocardial infarction) (HCC)   . COPD (chronic obstructive pulmonary disease) (HCC)   . Hypertension   . CKD (chronic kidney disease)   . 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HCC)   . ICD (implantable cardioverter-defibrillator) in place   . GERD (gastroesophageal reflux disease)   . Diabetes mellitus with diabetic neuropathy, with long-term current use of insulin (HCC)     Current Outpatient Medications:  .  acetaminophen (TYLENOL) 650 MG CR tablet, Take 650 mg by mouth every 8 (eight) hours as needed for pain., Disp: , Rfl:  .  albuterol (PROVENTIL) (2.5 MG/3ML) 0.083% nebulizer solution, Inhale into the lungs., Disp: , Rfl:  .  allopurinol (ZYLOPRIM) 100 MG tablet, Take 1 tablet (100 mg total) by mouth daily., Disp: 30 tablet, Rfl: 3 .  aspirin 81 MG chewable tablet, Chew 81 mg by mouth daily., Disp: , Rfl:  .  atorvastatin (LIPITOR) 40 MG tablet, TAKE 1 TABLET(40 MG) BY MOUTH DAILY AT 6 PM, Disp: 90 tablet, Rfl: 3 .  bisacodyl (DULCOLAX) 5 MG EC tablet, Take 5 mg by mouth daily as needed for moderate constipation., Disp: , Rfl:  .  digoxin (LANOXIN) 0.125 MG tablet, TAKE 1 TABLET(0.125 MG) BY MOUTH DAILY, Disp: 30 tablet, Rfl: 6 .  DULoxetine (CYMBALTA) 60 MG capsule, Take 60 mg by mouth daily., Disp: , Rfl: 2 .  eplerenone (INSPRA) 50 MG tablet, Take 1 tablet (50 mg total) by mouth daily., Disp: 30 tablet, Rfl: 6 .   fenofibrate (TRICOR) 145 MG tablet, Take 1 tablet (145 mg total) by mouth daily., Disp: 30 tablet, Rfl: 11 .  hydrALAZINE (APRESOLINE) 50 MG tablet, Take 1 tablet (50 mg total) by mouth 3 (three) times daily., Disp: 90 tablet, Rfl: 3 .  Insulin Pen Needle (NOVOFINE) 30G X 8 MM MISC, Inject 10 each into the skin as needed., Disp: 100 each, Rfl: 0 .  insulin regular human CONCENTRATED (HUMULIN R U-500 KWIKPEN) 500 UNIT/ML kwikpen, USE AS DIRECTED PER SLIDING SCALE. MAX DAILY DOSE IS 400 UNITS., Disp: , Rfl:  .  isosorbide dinitrate (ISORDIL) 20 MG tablet, Take 1 tablet (20 mg total) by mouth 3 (three) times daily., Disp: 90 tablet, Rfl: 11 .  JARDIANCE 25 MG TABS tablet, Take 25 mg by mouth daily., Disp: 30 tablet, Rfl: 6 .  MAGNESIUM-OXIDE 400 (241.3 Mg) MG tablet, Take 1 tablet (400 mg total) by mouth daily., Disp: 30 tablet, Rfl: 11 .  omeprazole (PRILOSEC) 20 MG capsule, TAKE 1 CAPSULE(20 MG) BY MOUTH DAILY, Disp: 30 capsule, Rfl: 1 .  potassium chloride 20 MEQ TBCR, As directed by CHF clinic. (Patient not taking: Reported on 04/15/2020), Disp: 10 tablet, Rfl: 0 .  pregabalin (LYRICA) 100 MG capsule, Take 1 capsule (100 mg total) by mouth 2 (two) times daily., Disp: 60 capsule, Rfl: 3 .  sacubitril-valsartan (ENTRESTO) 97-103 MG, Take 1 tablet by mouth 2 (two) times daily., Disp: 180 tablet,  Rfl: 3 .  torsemide (DEMADEX) 20 MG tablet, Take 1 tablet (20 mg total) by mouth daily., Disp: 60 tablet, Rfl: 3 Allergies  Allergen Reactions  . Codeine   . Coconut Oil Itching      Social History   Socioeconomic History  . Marital status: Divorced    Spouse name: Not on file  . Number of children: Not on file  . Years of education: Not on file  . Highest education level: Not on file  Occupational History  . Occupation: disability    Comment: stopped working in 2000 d/t occupational exposures  Tobacco Use  . Smoking status: Former Smoker    Types: Cigarettes  . Smokeless tobacco: Never Used   . Tobacco comment: "smoked when I drank"  Vaping Use  . Vaping Use: Never used  Substance and Sexual Activity  . 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 Drug use: Never  . Sexual activity: Not Currently  Other Topics Concern  . Not on file  Social History Narrative   Patient lives at home with wife and some of his 10 children. He self-administers his own medications.   Social Determinants of Health   Financial Resource Strain:   . Difficulty of Paying Living Expenses: Not on file  Food Insecurity:   . Worried About Programme researcher, broadcasting/film/video in the Last Year: Not on file  . Ran Out of Food in the Last Year: Not on file  Transportation Needs:   . Lack of Transportation (Medical): Not on file  . Lack of Transportation (Non-Medical): Not on file  Physical Activity:   . Days of Exercise per Week: Not on file  . Minutes of Exercise per Session: Not on file  Stress:   . Feeling of Stress : Not on file  Social Connections:   . Frequency of Communication with Friends and Family: Not on file  . Frequency of Social Gatherings with Friends and Family: Not on file  . Attends Religious Services: Not on file  . Active Member of Clubs or Organizations: Not on file  . Attends Banker Meetings: Not on file  . Marital Status: Not on file  Intimate Partner Violence:   . Fear of Current or Ex-Partner: Not on file  . Emotionally Abused: Not on file  . Physically Abused: Not on file  . Sexually Abused: Not on file    Physical Exam Cardiovascular:     Rate and Rhythm: Normal rate and regular rhythm.  Pulmonary:     Effort: Pulmonary effort is normal.     Breath sounds: Normal breath sounds.  Musculoskeletal:        General: Normal range of motion.     Right lower leg: Edema present.     Left lower leg: Edema present.  Skin:    General: Skin is warm and dry.     Capillary Refill: Capillary refill takes less than 2 seconds.   Neurological:     Mental Status: He is alert and oriented to person, place, and time.  Psychiatric:        Mood and Affect: Mood normal.         Future Appointments  Date Time Provider Department Center  06/01/2020  8:10 AM [facility] DEVICE REMOTES [facility]  07/08/2020  1:00 PM [facility] ECHO OP 1 [facility]  07/08/2020  2:00 PM Laurey Morale, MD [facility] None  08/31/2020  8:10  AM [facility] DEVICE REMOTES [facility]  11/30/2020  8:10 AM [facility] DEVICE REMOTES [facility]  03/01/2021  8:10 AM [facility] DEVICE REMOTES [facility]  05/31/2021  8:10 AM [facility] DEVICE REMOTES [facility]  08/30/2021  8:10 AM [facility] DEVICE REMOTES [facility]  11/29/2021  8:10 AM [facility] DEVICE REMOTES [facility]    BP 118/81 (BP Location: Left Arm, Patient Position: Sitting, Cuff Size: Large)   Pulse 77   Resp 16   Wt 269 lb (122 kg)   SpO2 90%   BMI 31.90 kg/m   Weight yesterday- did not weigh  Last visit weight- 269 lb  Mr Mercado was seen at home today and rpeorted feeling well. He denied chest pain, SOB, headache, dizziness, orthopnea, fever or cough over the past week He stated he has been compliant with his medications and his weight remains stable. His medications were verified and his pillbox was refilled. Necessary refills of Lyrica, duloxetine and mag-oxide were called in and Mr Defranco stated he would add it to his box upon picking it up tomorrow. I will follow up next week.   [name], EMT 05/26/20  ACTION: Home visit completed Next visit planned for 1 week

## 2020-06-01 ENCOUNTER — Ambulatory Visit (INDEPENDENT_AMBULATORY_CARE_PROVIDER_SITE_OTHER): Payer: Medicare Other

## 2020-06-01 DIAGNOSIS — I428 Other cardiomyopathies: Secondary | ICD-10-CM

## 2020-06-01 LAB — CUP PACEART REMOTE DEVICE CHECK
Battery Remaining Longevity: 89 mo
Battery Voltage: 2.99 V
Brady Statistic AP VP Percent: 0 %
Brady Statistic AP VS Percent: 0.07 %
Brady Statistic AS VP Percent: 0.03 %
Brady Statistic AS VS Percent: 99.89 %
Brady Statistic RA Percent Paced: 0.08 %
Brady Statistic RV Percent Paced: 0.04 %
Date Time Interrogation Session: 20211207001705
HighPow Impedance: 78 Ohm
Implantable Lead Implant Date: 20180719
Implantable Lead Implant Date: 20180719
Implantable Lead Location: 753859
Implantable Lead Location: 753860
Implantable Lead Model: 5076
Implantable Pulse Generator Implant Date: 20180719
Lead Channel Impedance Value: 342 Ohm
Lead Channel Impedance Value: 418 Ohm
Lead Channel Impedance Value: 418 Ohm
Lead Channel Pacing Threshold Amplitude: 0.75 V
Lead Channel Pacing Threshold Amplitude: 0.875 V
Lead Channel Pacing Threshold Pulse Width: 0.4 ms
Lead Channel Pacing Threshold Pulse Width: 0.4 ms
Lead Channel Sensing Intrinsic Amplitude: 4.125 mV
Lead Channel Sensing Intrinsic Amplitude: 4.125 mV
Lead Channel Sensing Intrinsic Amplitude: 8.625 mV
Lead Channel Sensing Intrinsic Amplitude: 8.625 mV
Lead Channel Setting Pacing Amplitude: 2 V
Lead Channel Setting Pacing Amplitude: 2 V
Lead Channel Setting Pacing Pulse Width: 0.4 ms
Lead Channel Setting Sensing Sensitivity: 0.3 mV

## 2020-06-02 ENCOUNTER — Other Ambulatory Visit (HOSPITAL_COMMUNITY): Payer: Self-pay

## 2020-06-02 NOTE — Progress Notes (Signed)
Paramedicine Encounter    Patient ID: Richard [name], male    DOB: [birth year], 59 y.o.   MRN: [id number]   Patient Care Team: Ananias Pilgrim, MD as PCP - General (Family Medicine) Laurey Morale, MD as PCP - Advanced Heart Failure (Cardiology) Burna Sis, LCSW as Social Worker (Licensed Clinical Social Worker)  Patient Active Problem List   Diagnosis Date Noted  . Acute on chronic combined systolic and diastolic CHF (congestive heart failure) (HCC) 03/15/2018  . S/P right and left heart catheterization 11/02/2017  . Panic attacks 11/02/2017  . Non-STEMI (non-ST elevated myocardial infarction) (HCC)   . COPD (chronic obstructive pulmonary disease) (HCC)   . Hypertension   . CKD (chronic kidney disease)   . 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HCC)   . ICD (implantable cardioverter-defibrillator) in place   . GERD (gastroesophageal reflux disease)   . Diabetes mellitus with diabetic neuropathy, with long-term current use of insulin (HCC)     Current Outpatient Medications:  .  acetaminophen (TYLENOL) 650 MG CR tablet, Take 650 mg by mouth every 8 (eight) hours as needed for pain., Disp: , Rfl:  .  albuterol (PROVENTIL) (2.5 MG/3ML) 0.083% nebulizer solution, Inhale into the lungs., Disp: , Rfl:  .  allopurinol (ZYLOPRIM) 100 MG tablet, Take 1 tablet (100 mg total) by mouth daily., Disp: 30 tablet, Rfl: 3 .  aspirin 81 MG chewable tablet, Chew 81 mg by mouth daily., Disp: , Rfl:  .  atorvastatin (LIPITOR) 40 MG tablet, TAKE 1 TABLET(40 MG) BY MOUTH DAILY AT 6 PM, Disp: 90 tablet, Rfl: 3 .  bisacodyl (DULCOLAX) 5 MG EC tablet, Take 5 mg by mouth daily as needed for moderate constipation., Disp: , Rfl:  .  digoxin (LANOXIN) 0.125 MG tablet, TAKE 1 TABLET(0.125 MG) BY MOUTH DAILY, Disp: 30 tablet, Rfl: 6 .  DULoxetine (CYMBALTA) 60 MG capsule, Take 60 mg by mouth daily., Disp: , Rfl: 2 .  eplerenone (INSPRA) 50 MG tablet, Take 1 tablet (50 mg total) by mouth daily., Disp: 30 tablet, Rfl: 6 .   fenofibrate (TRICOR) 145 MG tablet, Take 1 tablet (145 mg total) by mouth daily., Disp: 30 tablet, Rfl: 11 .  hydrALAZINE (APRESOLINE) 50 MG tablet, Take 1 tablet (50 mg total) by mouth 3 (three) times daily., Disp: 90 tablet, Rfl: 3 .  Insulin Pen Needle (NOVOFINE) 30G X 8 MM MISC, Inject 10 each into the skin as needed., Disp: 100 each, Rfl: 0 .  insulin regular human CONCENTRATED (HUMULIN R U-500 KWIKPEN) 500 UNIT/ML kwikpen, USE AS DIRECTED PER SLIDING SCALE. MAX DAILY DOSE IS 400 UNITS., Disp: , Rfl:  .  isosorbide dinitrate (ISORDIL) 20 MG tablet, Take 1 tablet (20 mg total) by mouth 3 (three) times daily., Disp: 90 tablet, Rfl: 11 .  JARDIANCE 25 MG TABS tablet, Take 25 mg by mouth daily., Disp: 30 tablet, Rfl: 6 .  MAGNESIUM-OXIDE 400 (241.3 Mg) MG tablet, Take 1 tablet (400 mg total) by mouth daily., Disp: 30 tablet, Rfl: 11 .  omeprazole (PRILOSEC) 20 MG capsule, TAKE 1 CAPSULE(20 MG) BY MOUTH DAILY, Disp: 30 capsule, Rfl: 1 .  pregabalin (LYRICA) 100 MG capsule, Take 1 capsule (100 mg total) by mouth 2 (two) times daily., Disp: 60 capsule, Rfl: 3 .  sacubitril-valsartan (ENTRESTO) 97-103 MG, Take 1 tablet by mouth 2 (two) times daily., Disp: 180 tablet, Rfl: 3 .  torsemide (DEMADEX) 20 MG tablet, Take 1 tablet (20 mg total) by mouth daily., Disp: 60 tablet, Rfl: 3 .  potassium chloride 20 MEQ TBCR, As directed by CHF clinic. (Patient not taking: Reported on 04/15/2020), Disp: 10 tablet, Rfl: 0 Allergies  Allergen Reactions  . Codeine   . Coconut Oil Itching      Social History   Socioeconomic History  . Marital status: Divorced    Spouse name: Not on file  . Number of children: Not on file  . Years of education: Not on file  . Highest education level: Not on file  Occupational History  . Occupation: disability    Comment: stopped working in 2000 d/t occupational exposures  Tobacco Use  . Smoking status: Former Smoker    Types: Cigarettes  . Smokeless tobacco: Never Used   . Tobacco comment: "smoked when I drank"  Vaping Use  . Vaping Use: Never used  Substance and Sexual Activity  . 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 Drug use: Never  . Sexual activity: Not Currently  Other Topics Concern  . Not on file  Social History Narrative   Patient lives at home with wife and some of his 10 children. He self-administers his own medications.   Social Determinants of Health   Financial Resource Strain:   . Difficulty of Paying Living Expenses: Not on file  Food Insecurity:   . Worried About Programme researcher, broadcasting/film/video in the Last Year: Not on file  . Ran Out of Food in the Last Year: Not on file  Transportation Needs:   . Lack of Transportation (Medical): Not on file  . Lack of Transportation (Non-Medical): Not on file  Physical Activity:   . Days of Exercise per Week: Not on file  . Minutes of Exercise per Session: Not on file  Stress:   . Feeling of Stress : Not on file  Social Connections:   . Frequency of Communication with Friends and Family: Not on file  . Frequency of Social Gatherings with Friends and Family: Not on file  . Attends Religious Services: Not on file  . Active Member of Clubs or Organizations: Not on file  . Attends Banker Meetings: Not on file  . Marital Status: Not on file  Intimate Partner Violence:   . Fear of Current or Ex-Partner: Not on file  . Emotionally Abused: Not on file  . Physically Abused: Not on file  . Sexually Abused: Not on file    Physical Exam Cardiovascular:     Rate and Rhythm: Normal rate and regular rhythm.     Pulses: Normal pulses.  Pulmonary:     Effort: Pulmonary effort is normal.     Breath sounds: Normal breath sounds.  Musculoskeletal:        General: Normal range of motion.     Right lower leg: Edema present.     Left lower leg: Edema present.  Skin:    General: Skin is warm and dry.     Capillary Refill: Capillary refill  takes less than 2 seconds.  Neurological:     Mental Status: He is alert and oriented to person, place, and time.  Psychiatric:        Mood and Affect: Mood normal.         Future Appointments  Date Time Provider Department Center  07/08/2020  1:00 PM [facility] ECHO OP 1 [facility]  07/08/2020  2:00 PM Laurey Morale, MD [facility] None  08/31/2020  8:10 AM [facility] DEVICE REMOTES [facility]  11/30/2020  8:10 AM [facility] DEVICE REMOTES [facility]  03/01/2021  8:10 AM [facility] DEVICE REMOTES [facility]  05/31/2021  8:10 AM [facility] DEVICE REMOTES [facility]  08/30/2021  8:10 AM [facility] DEVICE REMOTES [facility]  11/29/2021  8:10 AM [facility] DEVICE REMOTES [facility]    BP 116/79 (BP Location: Left Arm, Patient Position: Sitting, Cuff Size: Normal)   Pulse 86   Resp 18   Wt 272 lb (123.4 kg)   SpO2 90%   BMI 32.25 kg/m   Weight yesterday- did not weigh  Last visit weight- 265.5 lb  Mr Christoffersen was seen ay home today and reported feeling well. He denied chest pain, SOB, headache, dizziness, orthopnea, fever or cough. He stated the only time he gets SOB is with extended exertion but "not easily." He has been compliant with his medications however his weight has jumped up slightly. He was given an extra torsemide today and advised to reduce his fluid intake. He was understanding and agreeable. His medications were verified and his pillbox was refilled. I will follow up next week.   [name], EMT 06/02/20  ACTION: Home visit completed Next visit planned for 1 week

## 2020-06-09 ENCOUNTER — Other Ambulatory Visit (HOSPITAL_COMMUNITY): Payer: Self-pay | Admitting: Cardiology

## 2020-06-09 ENCOUNTER — Other Ambulatory Visit (HOSPITAL_COMMUNITY): Payer: Self-pay

## 2020-06-09 NOTE — Progress Notes (Signed)
Paramedicine Encounter    Patient ID: Richard [name], male    DOB: March 26, 1961, 59 y.o.   MRN: [id number]   Patient Care Team: Ananias Pilgrim, MD as PCP - General (Family Medicine) Laurey Morale, MD as PCP - Advanced Heart Failure (Cardiology) Burna Sis, LCSW as Social Worker (Licensed Clinical Social Worker)  Patient Active Problem List   Diagnosis Date Noted  . Acute on chronic combined systolic and diastolic CHF (congestive heart failure) (HCC) 03/15/2018  . S/P right and left heart catheterization 11/02/2017  . Panic attacks 11/02/2017  . Non-STEMI (non-ST elevated myocardial infarction) (HCC)   . COPD (chronic obstructive pulmonary disease) (HCC)   . Hypertension   . CKD (chronic kidney disease)   . 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HCC)   . ICD (implantable cardioverter-defibrillator) in place   . GERD (gastroesophageal reflux disease)   . Diabetes mellitus with diabetic neuropathy, with long-term current use of insulin (HCC)     Current Outpatient Medications:  .  acetaminophen (TYLENOL) 650 MG CR tablet, Take 650 mg by mouth every 8 (eight) hours as needed for pain., Disp: , Rfl:  .  albuterol (PROVENTIL) (2.5 MG/3ML) 0.083% nebulizer solution, Inhale into the lungs., Disp: , Rfl:  .  allopurinol (ZYLOPRIM) 100 MG tablet, Take 1 tablet (100 mg total) by mouth daily., Disp: 30 tablet, Rfl: 3 .  aspirin 81 MG chewable tablet, Chew 81 mg by mouth daily., Disp: , Rfl:  .  atorvastatin (LIPITOR) 40 MG tablet, TAKE 1 TABLET(40 MG) BY MOUTH DAILY AT 6 PM, Disp: 90 tablet, Rfl: 3 .  bisacodyl (DULCOLAX) 5 MG EC tablet, Take 5 mg by mouth daily as needed for moderate constipation., Disp: , Rfl:  .  digoxin (LANOXIN) 0.125 MG tablet, TAKE 1 TABLET(0.125 MG) BY MOUTH DAILY, Disp: 30 tablet, Rfl: 6 .  DULoxetine (CYMBALTA) 60 MG capsule, Take 60 mg by mouth daily., Disp: , Rfl: 2 .  eplerenone (INSPRA) 50 MG tablet, Take 1 tablet (50 mg total) by mouth daily., Disp: 30 tablet, Rfl: 6 .   fenofibrate (TRICOR) 145 MG tablet, Take 1 tablet (145 mg total) by mouth daily., Disp: 30 tablet, Rfl: 11 .  hydrALAZINE (APRESOLINE) 50 MG tablet, Take 1 tablet (50 mg total) by mouth 3 (three) times daily., Disp: 90 tablet, Rfl: 3 .  Insulin Pen Needle (NOVOFINE) 30G X 8 MM MISC, Inject 10 each into the skin as needed., Disp: 100 each, Rfl: 0 .  insulin regular human CONCENTRATED (HUMULIN R U-500 KWIKPEN) 500 UNIT/ML kwikpen, USE AS DIRECTED PER SLIDING SCALE. MAX DAILY DOSE IS 400 UNITS., Disp: , Rfl:  .  isosorbide dinitrate (ISORDIL) 20 MG tablet, Take 1 tablet (20 mg total) by mouth 3 (three) times daily., Disp: 90 tablet, Rfl: 11 .  JARDIANCE 25 MG TABS tablet, Take 25 mg by mouth daily., Disp: 30 tablet, Rfl: 6 .  MAGNESIUM-OXIDE 400 (241.3 Mg) MG tablet, Take 1 tablet (400 mg total) by mouth daily., Disp: 30 tablet, Rfl: 11 .  omeprazole (PRILOSEC) 20 MG capsule, TAKE 1 CAPSULE(20 MG) BY MOUTH DAILY, Disp: 30 capsule, Rfl: 1 .  potassium chloride 20 MEQ TBCR, As directed by CHF clinic. (Patient not taking: Reported on 04/15/2020), Disp: 10 tablet, Rfl: 0 .  pregabalin (LYRICA) 100 MG capsule, Take 1 capsule (100 mg total) by mouth 2 (two) times daily., Disp: 60 capsule, Rfl: 3 .  sacubitril-valsartan (ENTRESTO) 97-103 MG, Take 1 tablet by mouth 2 (two) times daily., Disp: 180 tablet,  Rfl: 3 .  torsemide (DEMADEX) 20 MG tablet, Take 1 tablet (20 mg total) by mouth daily., Disp: 60 tablet, Rfl: 3 Allergies  Allergen Reactions  . Codeine   . Coconut Oil Itching      Social History   Socioeconomic History  . Marital status: Divorced    Spouse name: Not on file  . Number of children: Not on file  . Years of education: Not on file  . Highest education level: Not on file  Occupational History  . Occupation: disability    Comment: stopped working in 2000 d/t occupational exposures  Tobacco Use  . Smoking status: Former Smoker    Types: Cigarettes  . Smokeless tobacco: Never Used   . Tobacco comment: "smoked when I drank"  Vaping Use  . Vaping Use: Never used  Substance and Sexual Activity  . 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 Drug use: Never  . Sexual activity: Not Currently  Other Topics Concern  . Not on file  Social History Narrative   Patient lives at home with wife and some of his 10 children. He self-administers his own medications.   Social Determinants of Health   Financial Resource Strain: Not on file  Food Insecurity: Not on file  Transportation Needs: Not on file  Physical Activity: Not on file  Stress: Not on file  Social Connections: Not on file  Intimate Partner Violence: Not on file    Physical Exam Cardiovascular:     Rate and Rhythm: Normal rate and regular rhythm.  Pulmonary:     Effort: Pulmonary effort is normal.     Breath sounds: Normal breath sounds.  Abdominal:     General: There is distension.  Musculoskeletal:        General: Normal range of motion.     Right lower leg: Edema present.     Left lower leg: Edema present.  Skin:    General: Skin is warm and dry.     Capillary Refill: Capillary refill takes less than 2 seconds.  Neurological:     Mental Status: He is alert and oriented to person, place, and time.  Psychiatric:        Mood and Affect: Mood normal.         Future Appointments  Date Time Provider Department Center  07/08/2020  1:00 PM [facility] ECHO OP 1 [facility]  07/08/2020  2:00 PM Laurey Morale, MD [facility] None  08/31/2020  8:10 AM [facility] DEVICE REMOTES [facility]  11/30/2020  8:10 AM [facility] DEVICE REMOTES [facility]  03/01/2021  8:10 AM [facility] DEVICE REMOTES [facility]  05/31/2021  8:10 AM [facility] DEVICE REMOTES [facility]  08/30/2021  8:10 AM [facility] DEVICE REMOTES [facility]  11/29/2021  8:10 AM [facility] DEVICE REMOTES [facility]     BP 124/70 (BP Location: Left Arm, Patient Position: Sitting, Cuff Size: Large)   Resp 16   Wt 266 lb (120.7 kg)   SpO2 91%   BMI 31.54 kg/m   Weight yesterday- 270 lb Last visit weight- 272 lb  Mr Mcneal was seen at home today and reported feeling well. He denied chest pain, SOB, headache, dizziness, orthopnea, fever or cough over the past week. He reported being compliant with his medications over the past week and his weight has been trending down. He had not picked up digoxin from the pharmacy but it is ready at the pharmacy.  Also ordered were hydralazine and isosorbide. His medications were verified and his pillbox was refilled. I will follow up next week.   [name], EMT 06/09/20  ACTION: Home visit completed Next visit planned for 1 week

## 2020-06-10 NOTE — Progress Notes (Signed)
Remote ICD transmission.   

## 2020-06-16 ENCOUNTER — Other Ambulatory Visit (HOSPITAL_COMMUNITY): Payer: Self-pay

## 2020-06-16 NOTE — Progress Notes (Signed)
Paramedicine Encounter    Patient ID: Richard [name], male    DOB: Feb 11, 1961, 59 y.o.   MRN: [id number]   Patient Care Team: Wallene Dales, MD as PCP - General (Family Medicine) Larey Dresser, MD as PCP - Advanced Heart Failure (Cardiology) Jorge Ny, LCSW as Social Worker (Licensed Clinical Social Worker)  Patient Active Problem List   Diagnosis Date Noted  . Acute on chronic combined systolic and diastolic CHF (congestive heart failure) ([place]) 03/15/2018  . S/P right and left heart catheterization 11/02/2017  . Panic attacks 11/02/2017  . Non-STEMI (non-ST elevated myocardial infarction) ([place])   . COPD (chronic obstructive pulmonary disease) ([place])   . Hypertension   . CKD (chronic kidney disease)   . 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[place])   . ICD (implantable cardioverter-defibrillator) in place   . GERD (gastroesophageal reflux disease)   . Diabetes mellitus with diabetic neuropathy, with long-term current use of insulin (HCC)     Current Outpatient Medications:  .  acetaminophen (TYLENOL) 650 MG CR tablet, Take 650 mg by mouth every 8 (eight) hours as needed for pain., Disp: , Rfl:  .  albuterol (PROVENTIL) (2.5 MG/3ML) 0.083% nebulizer solution, Inhale into the lungs., Disp: , Rfl:  .  allopurinol (ZYLOPRIM) 100 MG tablet, Take 1 tablet (100 mg total) by mouth daily., Disp: 30 tablet, Rfl: 3 .  aspirin 81 MG chewable tablet, Chew 81 mg by mouth daily., Disp: , Rfl:  .  atorvastatin (LIPITOR) 40 MG tablet, TAKE 1 TABLET(40 MG) BY MOUTH DAILY AT 6 PM, Disp: 90 tablet, Rfl: 3 .  bisacodyl (DULCOLAX) 5 MG EC tablet, Take 5 mg by mouth daily as needed for moderate constipation., Disp: , Rfl:  .  digoxin (LANOXIN) 0.125 MG tablet, TAKE 1 TABLET(0.125 MG) BY MOUTH DAILY, Disp: 30 tablet, Rfl: 6 .  DULoxetine (CYMBALTA) 60 MG capsule, Take 60 mg by mouth daily., Disp: , Rfl: 2 .  eplerenone (INSPRA) 50 MG tablet, Take 1 tablet (50 mg total) by mouth daily., Disp: 30 tablet, Rfl: 6 .   fenofibrate (TRICOR) 145 MG tablet, Take 1 tablet (145 mg total) by mouth daily., Disp: 30 tablet, Rfl: 11 .  hydrALAZINE (APRESOLINE) 50 MG tablet, TAKE 1 TABLET(50 MG) BY MOUTH THREE TIMES DAILY, Disp: 270 tablet, Rfl: 3 .  Insulin Pen Needle (NOVOFINE) 30G X 8 MM MISC, Inject 10 each into the skin as needed., Disp: 100 each, Rfl: 0 .  insulin regular human CONCENTRATED (HUMULIN R) 500 UNIT/ML kwikpen, USE AS DIRECTED PER SLIDING SCALE. MAX DAILY DOSE IS 400 UNITS., Disp: , Rfl:  .  isosorbide dinitrate (ISORDIL) 20 MG tablet, Take 1 tablet (20 mg total) by mouth 3 (three) times daily., Disp: 90 tablet, Rfl: 11 .  JARDIANCE 25 MG TABS tablet, Take 25 mg by mouth daily., Disp: 30 tablet, Rfl: 6 .  MAGNESIUM-OXIDE 400 (241.3 Mg) MG tablet, Take 1 tablet (400 mg total) by mouth daily., Disp: 30 tablet, Rfl: 11 .  omeprazole (PRILOSEC) 20 MG capsule, TAKE 1 CAPSULE(20 MG) BY MOUTH DAILY, Disp: 30 capsule, Rfl: 1 .  potassium chloride 20 MEQ TBCR, As directed by CHF clinic. (Patient not taking: No sig reported), Disp: 10 tablet, Rfl: 0 .  pregabalin (LYRICA) 100 MG capsule, Take 1 capsule (100 mg total) by mouth 2 (two) times daily., Disp: 60 capsule, Rfl: 3 .  sacubitril-valsartan (ENTRESTO) 97-103 MG, Take 1 tablet by mouth 2 (two) times daily., Disp: 180 tablet, Rfl: 3 .  torsemide (  DEMADEX) 20 MG tablet, Take 1 tablet (20 mg total) by mouth daily., Disp: 60 tablet, Rfl: 3 Allergies  Allergen Reactions  . Codeine   . Coconut Oil Itching      Social History   Socioeconomic History  . Marital status: Divorced    Spouse name: Not on file  . Number of children: Not on file  . Years of education: Not on file  . Highest education level: Not on file  Occupational History  . Occupation: disability    Comment: stopped working in 2000 d/t occupational exposures  Tobacco Use  . Smoking status: Former Smoker    Types: Cigarettes  . Smokeless tobacco: Never Used  . Tobacco comment: "smoked when  I drank"  Vaping Use  . Vaping Use: Never used  Substance and Sexual Activity  . 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 Drug use: Never  . Sexual activity: Not Currently  Other Topics Concern  . Not on file  Social History Narrative   Patient lives at home with wife and some of his 10 children. He self-administers his own medications.   Social Determinants of Health   Financial Resource Strain: Not on file  Food Insecurity: Not on file  Transportation Needs: Not on file  Physical Activity: Not on file  Stress: Not on file  Social Connections: Not on file  Intimate Partner Violence: Not on file    Physical Exam Cardiovascular:     Rate and Rhythm: Normal rate and regular rhythm.  Pulmonary:     Effort: Pulmonary effort is normal.     Breath sounds: Normal breath sounds.  Abdominal:     General: There is distension.  Musculoskeletal:        General: Normal range of motion.     Right lower leg: Edema present.     Left lower leg: Edema present.  Skin:    General: Skin is warm and dry.     Capillary Refill: Capillary refill takes less than 2 seconds.  Neurological:     Mental Status: He is alert and oriented to person, place, and time.  Psychiatric:        Mood and Affect: Mood normal.         Future Appointments  Date Time Provider Department Center  07/08/2020  1:00 PM [facility] ECHO OP 1 [facility]  07/08/2020  2:00 PM Laurey Morale, MD [facility] None  08/31/2020  8:10 AM [facility] DEVICE REMOTES [facility]  11/30/2020  8:10 AM [facility] DEVICE REMOTES [facility]  03/01/2021  8:10 AM [facility] DEVICE REMOTES [facility]  05/31/2021  8:10 AM [facility] DEVICE REMOTES [facility]  08/30/2021  8:10 AM [facility] DEVICE REMOTES [facility]  11/29/2021  8:10 AM [facility] DEVICE REMOTES [facility]    BP 113/68 (BP Location: Left  Arm, Patient Position: Sitting, Cuff Size: Normal)   Pulse 90   Resp 16   Wt 265 lb (120.2 kg)   SpO2 92%   BMI 31.42 kg/m   Weight yesterday- 266 lb Last visit weight- 266 lb  Mr Joles was seen at home today and reported feeling well. He denied chest pain, SOB, headache, dizziness, orthopnea, fever or cough over the past week. He stated he has been compliant with his medications and his weight has been stable. His medications were verified and his pillbox was refilled. All necessary refills were ordered (omeprazole) and will be available for  pick up tomorrow. I will follow up next week.   [name], EMT 06/16/20  ACTION: Home visit completed Next visit planned for 1 week

## 2020-06-23 ENCOUNTER — Other Ambulatory Visit (HOSPITAL_COMMUNITY): Payer: Self-pay | Admitting: Cardiology

## 2020-06-23 ENCOUNTER — Other Ambulatory Visit (HOSPITAL_COMMUNITY): Payer: Self-pay

## 2020-06-23 NOTE — Progress Notes (Signed)
Paramedicine Encounter    Patient ID: Richard [name], male    DOB: Feb 11, 1961, 59 y.o.   MRN: [id number]   Patient Care Team: Wallene Dales, MD as PCP - General (Family Medicine) Larey Dresser, MD as PCP - Advanced Heart Failure (Cardiology) Jorge Ny, LCSW as Social Worker (Licensed Clinical Social Worker)  Patient Active Problem List   Diagnosis Date Noted  . Acute on chronic combined systolic and diastolic CHF (congestive heart failure) ([place]) 03/15/2018  . S/P right and left heart catheterization 11/02/2017  . Panic attacks 11/02/2017  . Non-STEMI (non-ST elevated myocardial infarction) ([place])   . COPD (chronic obstructive pulmonary disease) ([place])   . Hypertension   . CKD (chronic kidney disease)   . 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[place])   . ICD (implantable cardioverter-defibrillator) in place   . GERD (gastroesophageal reflux disease)   . Diabetes mellitus with diabetic neuropathy, with long-term current use of insulin (HCC)     Current Outpatient Medications:  .  acetaminophen (TYLENOL) 650 MG CR tablet, Take 650 mg by mouth every 8 (eight) hours as needed for pain., Disp: , Rfl:  .  albuterol (PROVENTIL) (2.5 MG/3ML) 0.083% nebulizer solution, Inhale into the lungs., Disp: , Rfl:  .  allopurinol (ZYLOPRIM) 100 MG tablet, Take 1 tablet (100 mg total) by mouth daily., Disp: 30 tablet, Rfl: 3 .  aspirin 81 MG chewable tablet, Chew 81 mg by mouth daily., Disp: , Rfl:  .  atorvastatin (LIPITOR) 40 MG tablet, TAKE 1 TABLET(40 MG) BY MOUTH DAILY AT 6 PM, Disp: 90 tablet, Rfl: 3 .  bisacodyl (DULCOLAX) 5 MG EC tablet, Take 5 mg by mouth daily as needed for moderate constipation., Disp: , Rfl:  .  digoxin (LANOXIN) 0.125 MG tablet, TAKE 1 TABLET(0.125 MG) BY MOUTH DAILY, Disp: 30 tablet, Rfl: 6 .  DULoxetine (CYMBALTA) 60 MG capsule, Take 60 mg by mouth daily., Disp: , Rfl: 2 .  eplerenone (INSPRA) 50 MG tablet, Take 1 tablet (50 mg total) by mouth daily., Disp: 30 tablet, Rfl: 6 .   fenofibrate (TRICOR) 145 MG tablet, Take 1 tablet (145 mg total) by mouth daily., Disp: 30 tablet, Rfl: 11 .  hydrALAZINE (APRESOLINE) 50 MG tablet, TAKE 1 TABLET(50 MG) BY MOUTH THREE TIMES DAILY, Disp: 270 tablet, Rfl: 3 .  Insulin Pen Needle (NOVOFINE) 30G X 8 MM MISC, Inject 10 each into the skin as needed., Disp: 100 each, Rfl: 0 .  insulin regular human CONCENTRATED (HUMULIN R) 500 UNIT/ML kwikpen, USE AS DIRECTED PER SLIDING SCALE. MAX DAILY DOSE IS 400 UNITS., Disp: , Rfl:  .  isosorbide dinitrate (ISORDIL) 20 MG tablet, Take 1 tablet (20 mg total) by mouth 3 (three) times daily., Disp: 90 tablet, Rfl: 11 .  JARDIANCE 25 MG TABS tablet, Take 25 mg by mouth daily., Disp: 30 tablet, Rfl: 6 .  MAGNESIUM-OXIDE 400 (241.3 Mg) MG tablet, Take 1 tablet (400 mg total) by mouth daily., Disp: 30 tablet, Rfl: 11 .  omeprazole (PRILOSEC) 20 MG capsule, TAKE 1 CAPSULE(20 MG) BY MOUTH DAILY, Disp: 30 capsule, Rfl: 1 .  potassium chloride 20 MEQ TBCR, As directed by CHF clinic. (Patient not taking: No sig reported), Disp: 10 tablet, Rfl: 0 .  pregabalin (LYRICA) 100 MG capsule, Take 1 capsule (100 mg total) by mouth 2 (two) times daily., Disp: 60 capsule, Rfl: 3 .  sacubitril-valsartan (ENTRESTO) 97-103 MG, Take 1 tablet by mouth 2 (two) times daily., Disp: 180 tablet, Rfl: 3 .  torsemide (  DEMADEX) 20 MG tablet, Take 1 tablet (20 mg total) by mouth daily., Disp: 60 tablet, Rfl: 3 Allergies  Allergen Reactions  . Codeine   . Coconut Oil Itching      Social History   Socioeconomic History  . Marital status: Divorced    Spouse name: Not on file  . Number of children: Not on file  . Years of education: Not on file  . Highest education level: Not on file  Occupational History  . Occupation: disability    Comment: stopped working in 2000 d/t occupational exposures  Tobacco Use  . Smoking status: Former Smoker    Types: Cigarettes  . Smokeless tobacco: Never Used  . Tobacco comment: "smoked when  I drank"  Vaping Use  . Vaping Use: Never used  Substance and Sexual Activity  . 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 Drug use: Never  . Sexual activity: Not Currently  Other Topics Concern  . Not on file  Social History Narrative   Patient lives at home with wife and some of his 10 children. He self-administers his own medications.   Social Determinants of Health   Financial Resource Strain: Not on file  Food Insecurity: Not on file  Transportation Needs: Not on file  Physical Activity: Not on file  Stress: Not on file  Social Connections: Not on file  Intimate Partner Violence: Not on file    Physical Exam Cardiovascular:     Rate and Rhythm: Normal rate and regular rhythm.  Pulmonary:     Effort: Pulmonary effort is normal.     Breath sounds: Normal breath sounds.  Abdominal:     General: There is distension.  Musculoskeletal:        General: Normal range of motion.     Right lower leg: No edema.     Left lower leg: No edema.  Skin:    General: Skin is warm and dry.     Capillary Refill: Capillary refill takes less than 2 seconds.  Neurological:     Mental Status: He is alert and oriented to person, place, and time.  Psychiatric:        Mood and Affect: Mood normal.         Future Appointments  Date Time Provider Department Center  07/08/2020  1:00 PM [facility] ECHO OP 1 [facility]  07/08/2020  2:00 PM Laurey Morale, MD [facility] None  08/31/2020  8:10 AM [facility] DEVICE REMOTES [facility]  11/30/2020  8:10 AM [facility] DEVICE REMOTES [facility]  03/01/2021  8:10 AM [facility] DEVICE REMOTES [facility]  05/31/2021  8:10 AM [facility] DEVICE REMOTES [facility]  08/30/2021  8:10 AM [facility] DEVICE REMOTES [facility]  11/29/2021  8:10 AM [facility] DEVICE REMOTES [facility]    BP 117/66 (BP Location: Left Arm, Patient  Position: Sitting, Cuff Size: Large)   Pulse 96   Resp 16   Wt 268 lb (121.6 kg)   SpO2 96%   BMI 31.78 kg/m   Weight yesterday- did not weigh Last visit weight- 265 lb  Mr Martis was seen at home today and reported feeling well. He denied chest pain, SOB, headache, dizziness, orthopnea, fever or cough over the past week. He stated he has been compliant with his medications over the past week and his weight has been stable. His medications were verified and his pillbox was refilled. I will follow up next week.  [name], EMT 06/23/20  ACTION: Home visit completed Next visit planned for 1 week

## 2020-06-30 ENCOUNTER — Other Ambulatory Visit (HOSPITAL_COMMUNITY): Payer: Self-pay

## 2020-06-30 NOTE — Progress Notes (Signed)
Paramedicine Encounter    Patient ID: Richard [name], male    DOB: Feb 11, 1961, 60 y.o.   MRN: [id number]   Patient Care Team: Wallene Dales, MD as PCP - General (Family Medicine) Larey Dresser, MD as PCP - Advanced Heart Failure (Cardiology) Jorge Ny, LCSW as Social Worker (Licensed Clinical Social Worker)  Patient Active Problem List   Diagnosis Date Noted  . Acute on chronic combined systolic and diastolic CHF (congestive heart failure) ([place]) 03/15/2018  . S/P right and left heart catheterization 11/02/2017  . Panic attacks 11/02/2017  . Non-STEMI (non-ST elevated myocardial infarction) ([place])   . COPD (chronic obstructive pulmonary disease) ([place])   . Hypertension   . CKD (chronic kidney disease)   . 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[place])   . ICD (implantable cardioverter-defibrillator) in place   . GERD (gastroesophageal reflux disease)   . Diabetes mellitus with diabetic neuropathy, with long-term current use of insulin (HCC)     Current Outpatient Medications:  .  acetaminophen (TYLENOL) 650 MG CR tablet, Take 650 mg by mouth every 8 (eight) hours as needed for pain., Disp: , Rfl:  .  albuterol (PROVENTIL) (2.5 MG/3ML) 0.083% nebulizer solution, Inhale into the lungs., Disp: , Rfl:  .  allopurinol (ZYLOPRIM) 100 MG tablet, Take 1 tablet (100 mg total) by mouth daily., Disp: 30 tablet, Rfl: 3 .  aspirin 81 MG chewable tablet, Chew 81 mg by mouth daily., Disp: , Rfl:  .  atorvastatin (LIPITOR) 40 MG tablet, TAKE 1 TABLET(40 MG) BY MOUTH DAILY AT 6 PM, Disp: 90 tablet, Rfl: 3 .  bisacodyl (DULCOLAX) 5 MG EC tablet, Take 5 mg by mouth daily as needed for moderate constipation., Disp: , Rfl:  .  digoxin (LANOXIN) 0.125 MG tablet, TAKE 1 TABLET(0.125 MG) BY MOUTH DAILY, Disp: 30 tablet, Rfl: 6 .  DULoxetine (CYMBALTA) 60 MG capsule, Take 60 mg by mouth daily., Disp: , Rfl: 2 .  eplerenone (INSPRA) 50 MG tablet, Take 1 tablet (50 mg total) by mouth daily., Disp: 30 tablet, Rfl: 6 .   fenofibrate (TRICOR) 145 MG tablet, Take 1 tablet (145 mg total) by mouth daily., Disp: 30 tablet, Rfl: 11 .  hydrALAZINE (APRESOLINE) 50 MG tablet, TAKE 1 TABLET(50 MG) BY MOUTH THREE TIMES DAILY, Disp: 270 tablet, Rfl: 3 .  Insulin Pen Needle (NOVOFINE) 30G X 8 MM MISC, Inject 10 each into the skin as needed., Disp: 100 each, Rfl: 0 .  insulin regular human CONCENTRATED (HUMULIN R) 500 UNIT/ML kwikpen, USE AS DIRECTED PER SLIDING SCALE. MAX DAILY DOSE IS 400 UNITS., Disp: , Rfl:  .  isosorbide dinitrate (ISORDIL) 20 MG tablet, Take 1 tablet (20 mg total) by mouth 3 (three) times daily., Disp: 90 tablet, Rfl: 11 .  JARDIANCE 25 MG TABS tablet, Take 25 mg by mouth daily., Disp: 30 tablet, Rfl: 6 .  MAGNESIUM-OXIDE 400 (241.3 Mg) MG tablet, Take 1 tablet (400 mg total) by mouth daily., Disp: 30 tablet, Rfl: 11 .  omeprazole (PRILOSEC) 20 MG capsule, TAKE 1 CAPSULE(20 MG) BY MOUTH DAILY, Disp: 30 capsule, Rfl: 1 .  potassium chloride 20 MEQ TBCR, As directed by CHF clinic. (Patient not taking: No sig reported), Disp: 10 tablet, Rfl: 0 .  pregabalin (LYRICA) 100 MG capsule, Take 1 capsule (100 mg total) by mouth 2 (two) times daily., Disp: 60 capsule, Rfl: 3 .  sacubitril-valsartan (ENTRESTO) 97-103 MG, Take 1 tablet by mouth 2 (two) times daily., Disp: 180 tablet, Rfl: 3 .  torsemide (  DEMADEX) 20 MG tablet, Take 1 tablet (20 mg total) by mouth daily., Disp: 60 tablet, Rfl: 3 Allergies  Allergen Reactions  . Codeine   . Coconut Oil Itching      Social History   Socioeconomic History  . Marital status: Divorced    Spouse name: Not on file  . Number of children: Not on file  . Years of education: Not on file  . Highest education level: Not on file  Occupational History  . Occupation: disability    Comment: stopped working in 2000 d/t occupational exposures  Tobacco Use  . Smoking status: Former Smoker    Types: Cigarettes  . Smokeless tobacco: Never Used  . Tobacco comment: "smoked when  I drank"  Vaping Use  . Vaping Use: Never used  Substance and Sexual Activity  . 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 Drug use: Never  . Sexual activity: Not Currently  Other Topics Concern  . Not on file  Social History Narrative   Patient lives at home with wife and some of his 10 children. He self-administers his own medications.   Social Determinants of Health   Financial Resource Strain: Not on file  Food Insecurity: Not on file  Transportation Needs: Not on file  Physical Activity: Not on file  Stress: Not on file  Social Connections: Not on file  Intimate Partner Violence: Not on file    Physical Exam Cardiovascular:     Rate and Rhythm: Normal rate and regular rhythm.  Pulmonary:     Effort: Pulmonary effort is normal.     Breath sounds: Normal breath sounds.  Musculoskeletal:        General: Normal range of motion.     Right lower leg: No edema.     Left lower leg: No edema.  Skin:    General: Skin is warm and dry.     Capillary Refill: Capillary refill takes less than 2 seconds.  Neurological:     Mental Status: He is alert and oriented to person, place, and time.  Psychiatric:        Mood and Affect: Mood normal.         Future Appointments  Date Time Provider Department Center  07/08/2020  1:00 PM [facility] ECHO OP 1 [facility]  07/08/2020  2:00 PM Laurey Morale, MD [facility] None  08/31/2020  8:10 AM [facility] DEVICE REMOTES [facility]  11/30/2020  8:10 AM [facility] DEVICE REMOTES [facility]  03/01/2021  8:10 AM [facility] DEVICE REMOTES [facility]  05/31/2021  8:10 AM [facility] DEVICE REMOTES [facility]  08/30/2021  8:10 AM [facility] DEVICE REMOTES [facility]  11/29/2021  8:10 AM [facility] DEVICE REMOTES [facility]    BP 121/72 (BP Location: Left Arm, Patient Position: Sitting, Cuff Size: Normal)   Pulse  82   Resp 16   Wt 266 lb (120.7 kg)   SpO2 90%   BMI 31.54 kg/m   Weight yesterday- 267 lb Last visit weight- 268 lb  Richard [name] was seen at home today and reported feeling well. He denied chest pain, SOB, headache, dizziness, orthopnea, fever or cough over the past week. He stated he has been compliant with his medications over the past week and his weight has been stable. His medications were verified and his pillbox was refilled. I will follow up next week.   [name], EMT 06/30/20  ACTION: Home visit  completed Next visit planned for 1 week

## 2020-07-07 ENCOUNTER — Telehealth (HOSPITAL_COMMUNITY): Payer: Self-pay | Admitting: Licensed Clinical Social Worker

## 2020-07-07 NOTE — Telephone Encounter (Signed)
CSW informed by [employer] that pt needing help with transport to his echo and clinic appt tomorrow.  CSW discussed with pt and pt is agreeable to using [facility].  CSW called [facility] to enroll pt with services and set up ride for tomorrow.  Pt will receive call tomorrow with ride details- CSW will continue to follow and assist as needed  Burna Sis, LCSW Clinical Social Worker [facility] Desk#: [phone] Cell#: [phone]

## 2020-07-08 ENCOUNTER — Ambulatory Visit (HOSPITAL_COMMUNITY)
Admission: RE | Admit: 2020-07-08 | Discharge: 2020-07-08 | Disposition: A | Discharge status: Home / Self Care | Payer: Medicare Other | Source: Ambulatory Visit | Attending: Adult Health | Admitting: Adult Health

## 2020-07-08 ENCOUNTER — Other Ambulatory Visit: Payer: Self-pay

## 2020-07-08 ENCOUNTER — Encounter (HOSPITAL_COMMUNITY): Payer: Self-pay | Admitting: Cardiology

## 2020-07-08 ENCOUNTER — Ambulatory Visit (HOSPITAL_BASED_OUTPATIENT_CLINIC_OR_DEPARTMENT_OTHER)
Admission: RE | Admit: 2020-07-08 | Discharge: 2020-07-08 | Disposition: A | Discharge status: Home / Self Care | Payer: Medicare Other | Source: Ambulatory Visit | Attending: Cardiology | Admitting: Cardiology

## 2020-07-08 VITALS — BP 150/88 | HR 73 | Wt 270.4 lb

## 2020-07-08 DIAGNOSIS — I428 Other cardiomyopathies: Secondary | ICD-10-CM | POA: Diagnosis not present

## 2020-07-08 DIAGNOSIS — G4733 Obstructive sleep apnea (adult) (pediatric): Secondary | ICD-10-CM | POA: Diagnosis not present

## 2020-07-08 DIAGNOSIS — N183 Chronic kidney disease, stage 3 unspecified: Secondary | ICD-10-CM | POA: Diagnosis not present

## 2020-07-08 DIAGNOSIS — D751 Secondary polycythemia: Secondary | ICD-10-CM | POA: Diagnosis not present

## 2020-07-08 DIAGNOSIS — I2510 Atherosclerotic heart disease of native coronary artery without angina pectoris: Secondary | ICD-10-CM | POA: Diagnosis not present

## 2020-07-08 DIAGNOSIS — Z7984 Long term (current) use of oral hypoglycemic drugs: Secondary | ICD-10-CM | POA: Diagnosis not present

## 2020-07-08 DIAGNOSIS — Z79899 Other long term (current) drug therapy: Secondary | ICD-10-CM | POA: Diagnosis not present

## 2020-07-08 DIAGNOSIS — E1122 Type 2 diabetes mellitus with diabetic chronic kidney disease: Secondary | ICD-10-CM | POA: Insufficient documentation

## 2020-07-08 DIAGNOSIS — Z5731 Occupational exposure to environmental tobacco smoke: Secondary | ICD-10-CM | POA: Diagnosis not present

## 2020-07-08 DIAGNOSIS — Z955 Presence of coronary angioplasty implant and graft: Secondary | ICD-10-CM | POA: Insufficient documentation

## 2020-07-08 DIAGNOSIS — J449 Chronic obstructive pulmonary disease, unspecified: Secondary | ICD-10-CM | POA: Diagnosis not present

## 2020-07-08 DIAGNOSIS — I5042 Chronic combined systolic (congestive) and diastolic (congestive) heart failure: Secondary | ICD-10-CM | POA: Diagnosis not present

## 2020-07-08 DIAGNOSIS — Z885 Allergy status to narcotic agent status: Secondary | ICD-10-CM | POA: Diagnosis not present

## 2020-07-08 DIAGNOSIS — I5022 Chronic systolic (congestive) heart failure: Secondary | ICD-10-CM | POA: Insufficient documentation

## 2020-07-08 DIAGNOSIS — Z9981 Dependence on supplemental oxygen: Secondary | ICD-10-CM | POA: Insufficient documentation

## 2020-07-08 DIAGNOSIS — Z95810 Presence of automatic (implantable) cardiac defibrillator: Secondary | ICD-10-CM | POA: Insufficient documentation

## 2020-07-08 DIAGNOSIS — I130 Hypertensive heart and chronic kidney disease with heart failure and stage 1 through stage 4 chronic kidney disease, or unspecified chronic kidney disease: Secondary | ICD-10-CM | POA: Insufficient documentation

## 2020-07-08 DIAGNOSIS — Z7982 Long term (current) use of aspirin: Secondary | ICD-10-CM | POA: Insufficient documentation

## 2020-07-08 DIAGNOSIS — Z794 Long term (current) use of insulin: Secondary | ICD-10-CM | POA: Insufficient documentation

## 2020-07-08 DIAGNOSIS — E781 Pure hyperglyceridemia: Secondary | ICD-10-CM | POA: Insufficient documentation

## 2020-07-08 HISTORY — DX: Heart failure, unspecified: I50.9

## 2020-07-08 LAB — BASIC METABOLIC PANEL
Anion gap: 10 (ref 5–15)
BUN: 31 mg/dL — ABNORMAL HIGH (ref 6–20)
CO2: 33 mmol/L — ABNORMAL HIGH (ref 22–32)
Calcium: 8.9 mg/dL (ref 8.9–10.3)
Chloride: 98 mmol/L (ref 98–111)
Creatinine, Ser: 1.81 mg/dL — ABNORMAL HIGH (ref 0.61–1.24)
GFR, Estimated: 43 mL/min — ABNORMAL LOW (ref >60)
Glucose, Bld: 109 mg/dL — ABNORMAL HIGH (ref 70–99)
Potassium: 4.3 mmol/L (ref 3.5–5.1)
Sodium: 141 mmol/L (ref 135–145)

## 2020-07-08 LAB — DIGOXIN LEVEL: Digoxin Level: 0.5 ng/mL — ABNORMAL LOW (ref 0.8–2.0)

## 2020-07-08 LAB — ECHOCARDIOGRAM COMPLETE
Area-P 1/2: 3.42 cm2
S' Lateral: 6.4 cm

## 2020-07-08 MED ORDER — TORSEMIDE 20 MG PO TABS
40.0000 mg | ORAL_TABLET | Freq: Every day | ORAL | 3 refills | Status: DC
Start: 2020-07-08 — End: 2020-09-06

## 2020-07-08 MED ORDER — ISOSORBIDE DINITRATE 40 MG PO TABS: 40.0000 mg | ORAL_TABLET | Freq: Three times a day (TID) | ORAL | 3 refills | Status: DC

## 2020-07-08 MED ORDER — HYDRALAZINE HCL 50 MG PO TABS
75.0000 mg | ORAL_TABLET | Freq: Three times a day (TID) | ORAL | 3 refills | Status: DC
Start: 2020-07-08 — End: 2020-09-09

## 2020-07-08 MED ORDER — PERFLUTREN LIPID MICROSPHERE
1.0000 mL | INTRAVENOUS | Status: DC | PRN
  Administered 2020-07-08: 3 mL via INTRAVENOUS
  Filled 2020-07-08: qty 10

## 2020-07-08 NOTE — Progress Notes (Signed)
  Echocardiogram 2D Echocardiogram has been performed.  Richard [name] 07/08/2020, 2:08 PM

## 2020-07-08 NOTE — Patient Instructions (Signed)
Increase Torsemide to 40 mg (2 tabs) Daily  Increase Hydralazine to 75 mg (1 & 1/2 tabs) Three times a day   Increase Isordil to 40 mg Three times a day   Labs done today  Your physician recommends that you return for lab work in: 10-14 days   Your physician recommends that you schedule a follow-up appointment in: 1 month  If you have any questions or concerns before your next appointment please send [place] a message through [place] or call our office at [phone].    TO LEAVE A MESSAGE FOR THE NURSE SELECT OPTION 2, PLEASE LEAVE A MESSAGE INCLUDING: . YOUR NAME . DATE OF BIRTH . CALL BACK NUMBER . REASON FOR CALL**this is important as we prioritize the call backs  YOU WILL RECEIVE A CALL BACK THE SAME DAY AS LONG AS YOU CALL BEFORE 4:00 PM  At the [facility], you and your health needs are our priority. As part of our continuing mission to provide you with exceptional heart care, we have created designated Provider Care Teams. These Care Teams include your primary Cardiologist (physician) and Advanced Practice Providers (APPs- Physician Assistants and Nurse Practitioners) who all work together to provide you with the care you need, when you need it.   You may see any of the following providers on your designated Care Team at your next follow up: Marland Kitchen Dr Arvilla Meres . Dr Marca Ancona . Tonye Becket, NP . Robbie Lis, PA . Karle Plumber, PharmD   Please be sure to bring in all your medications bottles to every appointment.

## 2020-07-08 NOTE — Progress Notes (Signed)
ReDS Vest / Clip - 07/08/20 1500      ReDS Vest / Clip   Station Marker D    Ruler Value 39.5    ReDS Value Range Moderate volume overload    ReDS Actual Value 38

## 2020-07-08 NOTE — Progress Notes (Signed)
Heart Failure TeleHealth Note  Due to national recommendations of social distancing due to COVID 19, Audio/video telehealth visit is felt to be most appropriate for this patient at this time.  See MyChart message from today for patient consent regarding telehealth for [facility].  Date:  07/08/2020   ID:  Richard [name], DOB 1961/01/12, MRN [id number]  Location: Patient in the office, physician at home on quarantine.  Provider location: [facility] Type of Visit: Established patient   PCP:  Richard Pilgrim, MD  Cardiologist:  Dr. Shirlee [name]   History of Present Illness: Richard [name] is a 60 y.o. male who presents via audio/video conferencing for a telehealth visit today.     he denies symptoms worrisome for COVID 19.   Patient has a history of chronic systolic HF s/p Medtronic ICD (12/2016), HTN, DM, and HLD.   Admitted 5/8-5/14/19 with A/C systolic HF. Echo showed EF 15-20%. Advanced HF team was consulted. He diuresed 20 lbs with IV lasix, then transitioned to torsemide 40 mg daily. Underwent R/LHC showing a focal severe RCA stenosis treated with DES.  He did not, however, appear to have severe enough coronary disease to explain his cardiomyopathy. Cardiac index was low at 1.9.  HF meds were optimized. Beta blocker was not started due to low CI and soft BPs. He was discharged on home oxygen. Referred to cardiac rehab. DC weight: 260 lbs.   Echo in 2/20 showed EF 20-25% with severe LV dilation, moderate RV enlargement with moderately decreased RV systolic function.  CPX in 2/21 showed a severe functional limitation but it actually appeared to be primarily due to pulmonary issues.   Echo was done today and reviewed, EF < 20% with severe LV dilation, normal RV size with moderately decreased systolic function, dilated IVC.   He presents today for followup of CHF.  He is short of breath if he walks fast, mildly dyspneic with 1 flight of stairs. No chest pain.  No  orthopnea/PND. No lightheadedness.  Followed by paramedicine.  Weight is fairly stable.   REDS clip 38%.   Labs (10/19): K 3.9, creatinine 1.18  Labs (10/20): BNP 160, K 3.8, creatinine 1.31 Labs (4/21): TGs 475, LDL 67, K 4.2, creatinine 1.8, urine and serum immunofixations negative.  Labs (11/21): LDL 42, HDL 27, K 4.3, creatinine 7.10  PMH: 1. Chronic systolic CHF: Primarily nonischemic cardiomyopathy.  Has Medtronic ICD.   - Echo 5/19: EF 15-20%, grade 2 DD, mild MR, RV severely reduced, LA moderately dilated, RA mildly dilated, mild TR.  - RHC/LHC (5/19): 80% distal LAD stenosis, 80% proximal RCA stenosis treated with DES to pRCA. Mean RA 13, PA 69/31 mean 47, mean 24, CI 1.91, PVR 4.8 WU.  - Gynecomastia with spironolactone. - Echo (2/20): EF 20-25%, severe LV enlargement, moderate RV enlargement, moderately decreased RV systolic function.  - CPX (2/21): peak VO2 13.2, VE/VCO2 slope 24, RER 1.10. Severe functional limitation due to OHS with severe restrictive lung disease without significant HF limitation.  - PYP scan (7/21): Negative for TTR cardiac amyloidosis.  - Echo (1/22): EF < 20% with severe LV dilation, normal RV size with moderately decreased systolic function, dilated IVC.  2. COPD: Uses oxygen at night. Never smoked, but apparently had significant occupational exposure. It appears that he won a lawsuit dealing with the occupational exposure-related COPD.  3. CAD: Cath in 5/17 with 60% RCA stenosis.  - LHC (5/19): 80% distal LAD, 80% proximal RCA.  DES to proximal RCA.  4. Polycythemia: Probably due to chronic hypoxemia.  5. Type II diabetes.  6. HTN 7. GERD 8. CKD: Stage 3.  Likely due to diabetes and HTN.  9. OSA: Unable to tolerate CPAP.   Review of systems complete and found to be negative unless listed in HPI.    Current Outpatient Medications  Medication Sig Dispense Refill  . acetaminophen (TYLENOL) 650 MG CR tablet Take 650 mg by mouth every 8 (eight) hours  as needed for pain.    Marland Kitchen albuterol (PROVENTIL) (2.5 MG/3ML) 0.083% nebulizer solution Inhale into the lungs.    Marland Kitchen allopurinol (ZYLOPRIM) 100 MG tablet Take 1 tablet (100 mg total) by mouth daily. 30 tablet 3  . aspirin 81 MG chewable tablet Chew 81 mg by mouth daily.    Marland Kitchen atorvastatin (LIPITOR) 40 MG tablet TAKE 1 TABLET(40 MG) BY MOUTH DAILY AT 6 PM 90 tablet 3  . bisacodyl (DULCOLAX) 5 MG EC tablet Take 5 mg by mouth daily as needed for moderate constipation.    . digoxin (LANOXIN) 0.125 MG tablet TAKE 1 TABLET(0.125 MG) BY MOUTH DAILY 30 tablet 6  . DULoxetine (CYMBALTA) 60 MG capsule Take 60 mg by mouth daily.  2  . eplerenone (INSPRA) 50 MG tablet Take 1 tablet (50 mg total) by mouth daily. 30 tablet 6  . fenofibrate (TRICOR) 145 MG tablet Take 1 tablet (145 mg total) by mouth daily. 30 tablet 11  . Insulin Pen Needle (NOVOFINE) 30G X 8 MM MISC Inject 10 each into the skin as needed. 100 each 0  . insulin regular human CONCENTRATED (HUMULIN R) 500 UNIT/ML kwikpen USE AS DIRECTED PER SLIDING SCALE. MAX DAILY DOSE IS 400 UNITS.    Marland Kitchen JARDIANCE 25 MG TABS tablet Take 25 mg by mouth daily. 30 tablet 6  . MAGNESIUM-OXIDE 400 (241.3 Mg) MG tablet Take 1 tablet (400 mg total) by mouth daily. 30 tablet 11  . potassium chloride 20 MEQ TBCR As directed by CHF clinic. 10 tablet 0  . pregabalin (LYRICA) 100 MG capsule Take 1 capsule (100 mg total) by mouth 2 (two) times daily. 60 capsule 3  . sacubitril-valsartan (ENTRESTO) 97-103 MG Take 1 tablet by mouth 2 (two) times daily. 180 tablet 3  . hydrALAZINE (APRESOLINE) 50 MG tablet Take 1.5 tablets (75 mg total) by mouth 3 (three) times daily. 135 tablet 3  . isosorbide dinitrate (ISORDIL) 40 MG tablet Take 1 tablet (40 mg total) by mouth 3 (three) times daily. 60 tablet 3  . torsemide (DEMADEX) 20 MG tablet Take 2 tablets (40 mg total) by mouth daily. 60 tablet 3   No current facility-administered medications for this encounter.   Allergies  Allergen  Reactions  . Codeine   . Coconut Oil Itching    Social History   Socioeconomic History  . Marital status: Divorced    Spouse name: Not on file  . Number of children: Not on file  . Years of education: Not on file  . Highest education level: Not on file  Occupational History  . Occupation: disability    Comment: stopped working in 2000 d/t occupational exposures  Tobacco Use  . Smoking status: Former Smoker    Types: Cigarettes  . Smokeless tobacco: Never Used  . Tobacco comment: "smoked when I drank"  Vaping Use  . Vaping Use: Never used  Substance and Sexual Activity  . Alcohol use: Not Currently    Comment: used to drink multiple cases of beer  daily, quit 2018  . Drug use: Never  . Sexual activity: Not Currently  Other Topics Concern  . Not on file  Social History Narrative   Patient lives at home with wife and some of his 10 children. He self-administers his own medications.   Social Determinants of Health   Financial Resource Strain: Not on file  Food Insecurity: Not on file  Transportation Needs: Not on file  Physical Activity: Not on file  Stress: Not on file  Social Connections: Not on file  Intimate Partner Violence: Not on file   Family History  Problem Relation Age of Onset  . Hypertension Mother   . Diabetes Mother   . Hypertension Father   . Diabetes Father   . Diabetes Sister   . Diabetes Brother    Vitals:   07/08/20 1420  BP: (!) 150/88  Pulse: 73  SpO2: 90%  Weight: 122.7 kg (270 lb 6.4 oz)     Wt Readings from Last 3 Encounters:  07/08/20 122.7 kg (270 lb 6.4 oz)  06/30/20 120.7 kg (266 lb)  06/23/20 121.6 kg (268 lb)    Exam:  (Video/Tele Health Call; Exam is subjective and or/visual.) General:  Speaks in full sentences. No resp difficulty. Neck: JVP 8-9 cm Lungs: Normal respiratory effort with conversation.  Abdomen: Non-distended per patient report Extremities: 1+ ankle edema.  Neuro: Alert & oriented x 3.   ASSESSMENT &  PLAN:  1. Chronic systolic CHF:  Primarily nonischemic cardiomyopathy. Echo in 2017 with EF 15-20%. Medtronic ICD. Echo 10/2017 EF 15-20%, severely dilated RV with severely decreased systolic function. LHC/RHC 11/06/17 showed volume overload with 80% RCA stenosis.  Cardiac index low at 1.91. The degree of coronary disease does not explain his cardiomyopathy.  Echo in 2/20 showed severe LV dilation, EF 20-25%, moderately decreased RV systolic function.  PYP scan in 7/21 not suggestive of transthyretin amyloidosis. Echo done today showed EF < 20%, moderately decreased RV systolic function.  NYHA class II-III symptoms.  He is mildly volume overloaded by exam and by REDS clip.  - Increase torsemide to 40 mg daily, BMET today and in 10 days.   - Continue entresto 97/103 mg BID.  - Continue eplerenone 50 mg daily.   - Increase hydralazine to 75 mg tid and isordil to 40 mg tid.  - Continue digoxin 0.125 mg daily, check level today.  - Unable to tolerate bisoprolol due to dyspnea.  - Continue empagliflozin.  2. COPD: Never smoked, but apparently had significant occupational exposure. It appears that he won a lawsuit dealing with the occupational exposure-related COPD. CPX in 2/21 showed severe functional limitation, but it appeared to be due to pulmonary abnormalities rather than HF.  - Followup with pulmonary.  3. CAD:  LHC in 5/19 with 80-90% proximal RCA stenosis treated with DES to RCA.  This did not cause his cardiomyopathy but is a large RCA.  No chest pain.  - Continue ASA 81 daily.  - Continue atorvastatin, good LDL in 4/21.  4. Polycythemia: Likely related to chronic hypoxia.  5. OSA: Cannot tolerate CPAP, wears oxygen at night.  6. DM: He is on empagliflozin.   7. Hypertriglyceridemia: Off Vascepa due to blood in stool that resolved.  - Continue fenofibrate 145 mg daily.   COVID screen The patient does not have any symptoms that suggest any further testing/ screening at this time.  Social  distancing reinforced today.  Patient Risk: After full review of this patients clinical status, I feel  that they are at moderate risk for cardiac decompensation at this time.  Relevant cardiac medications were reviewed at length with the patient today. The patient does not have concerns regarding their medications at this time.   Recommended follow-up: 1 month with NP/PA to reassess volume.   Today, I have spent 20 minutes with the patient with telehealth technology discussing the above issues .    Signed, Marca Ancona, MD  07/08/2020  [facility] [address] [facility] [place] [address] [phone] (office) [phone] (fax)

## 2020-07-15 ENCOUNTER — Telehealth (HOSPITAL_COMMUNITY): Payer: Self-pay

## 2020-07-15 NOTE — Telephone Encounter (Signed)
I called Mr Devino to schedule an appointment with Florentina Addison or Herbert Seta. He did not answer so I left a message requesting he call me back.   [name], EMT 07/15/20

## 2020-07-16 ENCOUNTER — Other Ambulatory Visit (HOSPITAL_COMMUNITY): Payer: Medicare Other

## 2020-07-21 ENCOUNTER — Telehealth (HOSPITAL_COMMUNITY): Payer: Self-pay

## 2020-07-21 NOTE — Telephone Encounter (Signed)
Spoke to China who agreed to Monday morning visit. I will see him in the home on Monday. Call complete.

## 2020-07-26 ENCOUNTER — Other Ambulatory Visit (HOSPITAL_COMMUNITY): Payer: Self-pay | Admitting: Cardiology

## 2020-07-26 ENCOUNTER — Telehealth (HOSPITAL_COMMUNITY): Payer: Self-pay

## 2020-07-26 ENCOUNTER — Other Ambulatory Visit (HOSPITAL_COMMUNITY): Payer: Self-pay

## 2020-07-26 NOTE — Telephone Encounter (Signed)
Spoke to Starr and he agreed to visit at 10:00. Call complete.

## 2020-07-26 NOTE — Progress Notes (Signed)
Paramedicine Encounter    Patient ID: Richard [name], male    DOB: 04-23-1961, 60 y.o.   MRN: [id number]   Patient Care Team: Ananias Pilgrim, MD as PCP - General (Family Medicine) Laurey Morale, MD as PCP - Advanced Heart Failure (Cardiology) Burna Sis, LCSW as Social Worker (Licensed Clinical Social Worker)  Patient Active Problem List   Diagnosis Date Noted  . Acute on chronic combined systolic and diastolic CHF (congestive heart failure) (HCC) 03/15/2018  . S/P right and left heart catheterization 11/02/2017  . Panic attacks 11/02/2017  . Non-STEMI (non-ST elevated myocardial infarction) (HCC)   . COPD (chronic obstructive pulmonary disease) (HCC)   . Hypertension   . CKD (chronic kidney disease)   . 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HCC)   . ICD (implantable cardioverter-defibrillator) in place   . GERD (gastroesophageal reflux disease)   . Diabetes mellitus with diabetic neuropathy, with long-term current use of insulin (HCC)     Current Outpatient Medications:  .  acetaminophen (TYLENOL) 650 MG CR tablet, Take 650 mg by mouth every 8 (eight) hours as needed for pain., Disp: , Rfl:  .  allopurinol (ZYLOPRIM) 100 MG tablet, Take 1 tablet (100 mg total) by mouth daily., Disp: 30 tablet, Rfl: 3 .  aspirin 81 MG chewable tablet, Chew 81 mg by mouth daily., Disp: , Rfl:  .  atorvastatin (LIPITOR) 40 MG tablet, TAKE 1 TABLET(40 MG) BY MOUTH DAILY AT 6 PM, Disp: 90 tablet, Rfl: 3 .  bisacodyl (DULCOLAX) 5 MG EC tablet, Take 5 mg by mouth daily as needed for moderate constipation., Disp: , Rfl:  .  digoxin (LANOXIN) 0.125 MG tablet, TAKE 1 TABLET(0.125 MG) BY MOUTH DAILY, Disp: 30 tablet, Rfl: 6 .  DULoxetine (CYMBALTA) 60 MG capsule, Take 60 mg by mouth daily., Disp: , Rfl: 2 .  eplerenone (INSPRA) 50 MG tablet, Take 1 tablet (50 mg total) by mouth daily., Disp: 30 tablet, Rfl: 6 .  fenofibrate (TRICOR) 145 MG tablet, Take 1 tablet (145 mg total) by mouth daily., Disp: 30 tablet, Rfl:  11 .  hydrALAZINE (APRESOLINE) 50 MG tablet, Take 1.5 tablets (75 mg total) by mouth 3 (three) times daily., Disp: 135 tablet, Rfl: 3 .  Insulin Pen Needle (NOVOFINE) 30G X 8 MM MISC, Inject 10 each into the skin as needed., Disp: 100 each, Rfl: 0 .  insulin regular human CONCENTRATED (HUMULIN R) 500 UNIT/ML kwikpen, USE AS DIRECTED PER SLIDING SCALE. MAX DAILY DOSE IS 400 UNITS., Disp: , Rfl:  .  isosorbide dinitrate (ISORDIL) 40 MG tablet, Take 1 tablet (40 mg total) by mouth 3 (three) times daily., Disp: 60 tablet, Rfl: 3 .  JARDIANCE 25 MG TABS tablet, Take 25 mg by mouth daily., Disp: 30 tablet, Rfl: 6 .  MAGNESIUM-OXIDE 400 (241.3 Mg) MG tablet, Take 1 tablet (400 mg total) by mouth daily., Disp: 30 tablet, Rfl: 11 .  potassium chloride 20 MEQ TBCR, As directed by CHF clinic., Disp: 10 tablet, Rfl: 0 .  pregabalin (LYRICA) 100 MG capsule, Take 1 capsule (100 mg total) by mouth 2 (two) times daily., Disp: 60 capsule, Rfl: 3 .  sacubitril-valsartan (ENTRESTO) 97-103 MG, Take 1 tablet by mouth 2 (two) times daily., Disp: 180 tablet, Rfl: 3 .  torsemide (DEMADEX) 20 MG tablet, Take 2 tablets (40 mg total) by mouth daily., Disp: 60 tablet, Rfl: 3 Allergies  Allergen Reactions  . Codeine   . Coconut Oil Itching     Social History  Socioeconomic History  . Marital status: Divorced    Spouse name: Not on file  . Number of children: Not on file  . Years of education: Not on file  . Highest education level: Not on file  Occupational History  . Occupation: disability    Comment: stopped working in 2000 d/t occupational exposures  Tobacco Use  . Smoking status: Former Smoker    Types: Cigarettes  . Smokeless tobacco: Never Used  . Tobacco comment: "smoked when I drank"  Vaping Use  . Vaping Use: Never used  Substance and Sexual Activity  . 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 Drug use: Never  . Sexual activity: Not  Currently  Other Topics Concern  . Not on file  Social History Narrative   Patient lives at home with wife and some of his 10 children. He self-administers his own medications.   Social Determinants of Health   Financial Resource Strain: Not on file  Food Insecurity: Not on file  Transportation Needs: Not on file  Physical Activity: Not on file  Stress: Not on file  Social Connections: Not on file  Intimate Partner Violence: Not on file    Physical Exam Vitals reviewed.  Constitutional:      Appearance: He is normal weight.  HENT:     Head: Normocephalic.     Nose: Nose normal.     Mouth/Throat:     Mouth: Mucous membranes are moist.     Pharynx: Oropharynx is clear.  Eyes:     Pupils: Pupils are equal, round, and reactive to light.  Cardiovascular:     Rate and Rhythm: Normal rate and regular rhythm.     Pulses: Normal pulses.     Heart sounds: Normal heart sounds.  Pulmonary:     Effort: Pulmonary effort is normal.     Breath sounds: Normal breath sounds.  Abdominal:     General: There is distension.  Musculoskeletal:        General: Normal range of motion.     Cervical back: Normal range of motion.     Right lower leg: No edema.     Left lower leg: No edema.  Skin:    General: Skin is warm and dry.     Capillary Refill: Capillary refill takes less than 2 seconds.  Neurological:     General: No focal deficit present.     Mental Status: He is alert. Mental status is at baseline.  Psychiatric:        Mood and Affect: Mood normal.     Arrived for home visit for [name] who was alert and oriented reporting feeling good overall. [name] feels like he is holding fluid but hasn't seen [name] in two weeks and has filled his own pill box. Patient reports he has been taking his medications properly. Weight only up one pound today. No edema in legs noted however abdomen was distended. [name] did appear slightly short of breath upon walking around in his home. He denied  shortness of breath. I obtained vitals as noted. Lung sounds were clear. No JVD noted. No edema in legs. I reviewed medications and confirmed same. Pill box filled accordingly.   Missing medications: -Isosorbide  Weds- Sun (morn, noon, eve)  -Inspra Weds- Sun (morn)  -Potassium  Thurs- Mon (morn)  -Pregabalin  Sat and Sunday ( 1 in the morn, 2 in the eve)  -Entresto  Sat (eve)  Wynelle Link (morn, eve)  Refills above called in to [facility] on 1/31 at 1730.      Future Appointments  Date Time Provider Department Center  08/12/2020  2:00 PM [facility] PA/NP [facility] None  08/31/2020  8:10 AM [facility] DEVICE REMOTES [facility]  11/30/2020  8:10 AM [facility] DEVICE REMOTES [facility]  03/01/2021  8:10 AM [facility] DEVICE REMOTES [facility]  05/31/2021  8:10 AM [facility] DEVICE REMOTES [facility]  08/30/2021  8:10 AM [facility] DEVICE REMOTES [facility]  11/29/2021  8:10 AM [facility] DEVICE REMOTES [facility]     ACTION: Home visit completed Next visit planned for one week

## 2020-07-27 ENCOUNTER — Other Ambulatory Visit (HOSPITAL_COMMUNITY): Payer: Self-pay

## 2020-07-27 ENCOUNTER — Other Ambulatory Visit (HOSPITAL_COMMUNITY): Payer: Self-pay | Admitting: *Deleted

## 2020-07-27 MED ORDER — POTASSIUM CHLORIDE ER 20 MEQ PO TBCR: EXTENDED_RELEASE_TABLET | ORAL | 0 refills | Status: DC

## 2020-07-27 MED ORDER — EPLERENONE 50 MG PO TABS: 50.0000 mg | ORAL_TABLET | Freq: Every day | ORAL | 6 refills | Status: DC

## 2020-08-02 ENCOUNTER — Other Ambulatory Visit (HOSPITAL_COMMUNITY): Payer: Self-pay | Admitting: *Deleted

## 2020-08-02 MED ORDER — ISOSORBIDE DINITRATE 20 MG PO TABS: 40.0000 mg | ORAL_TABLET | Freq: Three times a day (TID) | ORAL | 3 refills | Status: DC

## 2020-08-09 ENCOUNTER — Telehealth (HOSPITAL_COMMUNITY): Payer: Self-pay

## 2020-08-09 NOTE — Telephone Encounter (Signed)
I called Richard [name] to remind him of his appointment at the HF clinic on Thursday at 14:00 and se if he needs help with transportation. He did not answer so I left a voicemail with the relevant information and asked that he let me know ASAP if he needs help with transportation.   [name], EMT 08/09/20

## 2020-08-11 ENCOUNTER — Telehealth (HOSPITAL_COMMUNITY): Payer: Self-pay

## 2020-08-11 NOTE — Telephone Encounter (Signed)
Mr Richard [name] called me and advised that he would need help getting a ride to his appointment tomorrow. I contacted [employer] [employer] and got his scheduled for pick up at 13:15. I relayed this information to Mr Amrhein and he was understanding and agreeable.   [name], EMT 08/11/20

## 2020-08-12 ENCOUNTER — Other Ambulatory Visit (HOSPITAL_COMMUNITY): Payer: Self-pay

## 2020-08-12 ENCOUNTER — Other Ambulatory Visit: Payer: Self-pay

## 2020-08-12 ENCOUNTER — Ambulatory Visit (HOSPITAL_COMMUNITY)
Admission: RE | Admit: 2020-08-12 | Discharge: 2020-08-12 | Disposition: A | Discharge status: Home / Self Care | Payer: Medicare Other | Source: Ambulatory Visit | Attending: Cardiology | Admitting: Cardiology

## 2020-08-12 VITALS — BP 131/84 | HR 90 | Wt 276.2 lb

## 2020-08-12 DIAGNOSIS — Z885 Allergy status to narcotic agent status: Secondary | ICD-10-CM | POA: Diagnosis not present

## 2020-08-12 DIAGNOSIS — Z7722 Contact with and (suspected) exposure to environmental tobacco smoke (acute) (chronic): Secondary | ICD-10-CM | POA: Diagnosis not present

## 2020-08-12 DIAGNOSIS — I5022 Chronic systolic (congestive) heart failure: Secondary | ICD-10-CM | POA: Diagnosis not present

## 2020-08-12 DIAGNOSIS — G4733 Obstructive sleep apnea (adult) (pediatric): Secondary | ICD-10-CM | POA: Diagnosis not present

## 2020-08-12 DIAGNOSIS — E1122 Type 2 diabetes mellitus with diabetic chronic kidney disease: Secondary | ICD-10-CM | POA: Insufficient documentation

## 2020-08-12 DIAGNOSIS — N189 Chronic kidney disease, unspecified: Secondary | ICD-10-CM | POA: Diagnosis not present

## 2020-08-12 DIAGNOSIS — E1140 Type 2 diabetes mellitus with diabetic neuropathy, unspecified: Secondary | ICD-10-CM | POA: Diagnosis not present

## 2020-08-12 DIAGNOSIS — I5042 Chronic combined systolic (congestive) and diastolic (congestive) heart failure: Secondary | ICD-10-CM | POA: Diagnosis not present

## 2020-08-12 DIAGNOSIS — I130 Hypertensive heart and chronic kidney disease with heart failure and stage 1 through stage 4 chronic kidney disease, or unspecified chronic kidney disease: Secondary | ICD-10-CM | POA: Insufficient documentation

## 2020-08-12 DIAGNOSIS — D751 Secondary polycythemia: Secondary | ICD-10-CM | POA: Diagnosis not present

## 2020-08-12 DIAGNOSIS — Z7182 Exercise counseling: Secondary | ICD-10-CM | POA: Insufficient documentation

## 2020-08-12 DIAGNOSIS — Z713 Dietary counseling and surveillance: Secondary | ICD-10-CM | POA: Diagnosis not present

## 2020-08-12 DIAGNOSIS — E781 Pure hyperglyceridemia: Secondary | ICD-10-CM | POA: Diagnosis not present

## 2020-08-12 DIAGNOSIS — Z794 Long term (current) use of insulin: Secondary | ICD-10-CM | POA: Diagnosis not present

## 2020-08-12 DIAGNOSIS — Z79899 Other long term (current) drug therapy: Secondary | ICD-10-CM | POA: Insufficient documentation

## 2020-08-12 DIAGNOSIS — I2510 Atherosclerotic heart disease of native coronary artery without angina pectoris: Secondary | ICD-10-CM | POA: Insufficient documentation

## 2020-08-12 DIAGNOSIS — Z8249 Family history of ischemic heart disease and other diseases of the circulatory system: Secondary | ICD-10-CM | POA: Diagnosis not present

## 2020-08-12 DIAGNOSIS — Z9981 Dependence on supplemental oxygen: Secondary | ICD-10-CM | POA: Diagnosis not present

## 2020-08-12 DIAGNOSIS — Z95810 Presence of automatic (implantable) cardiac defibrillator: Secondary | ICD-10-CM | POA: Insufficient documentation

## 2020-08-12 DIAGNOSIS — R443 Hallucinations, unspecified: Secondary | ICD-10-CM | POA: Diagnosis not present

## 2020-08-12 DIAGNOSIS — J449 Chronic obstructive pulmonary disease, unspecified: Secondary | ICD-10-CM | POA: Insufficient documentation

## 2020-08-12 DIAGNOSIS — E785 Hyperlipidemia, unspecified: Secondary | ICD-10-CM | POA: Insufficient documentation

## 2020-08-12 DIAGNOSIS — Z955 Presence of coronary angioplasty implant and graft: Secondary | ICD-10-CM | POA: Diagnosis not present

## 2020-08-12 DIAGNOSIS — Z7982 Long term (current) use of aspirin: Secondary | ICD-10-CM | POA: Insufficient documentation

## 2020-08-12 LAB — DIGOXIN LEVEL: Digoxin Level: <0.2 ng/mL — ABNORMAL LOW (ref 0.8–2.0)

## 2020-08-12 LAB — BASIC METABOLIC PANEL
Anion gap: 9 (ref 5–15)
BUN: 19 mg/dL (ref 6–20)
CO2: 33 mmol/L — ABNORMAL HIGH (ref 22–32)
Calcium: 9.3 mg/dL (ref 8.9–10.3)
Chloride: 101 mmol/L (ref 98–111)
Creatinine, Ser: 1.52 mg/dL — ABNORMAL HIGH (ref 0.61–1.24)
GFR, Estimated: 53 mL/min — ABNORMAL LOW (ref >60)
Glucose, Bld: 100 mg/dL — ABNORMAL HIGH (ref 70–99)
Potassium: 4.8 mmol/L (ref 3.5–5.1)
Sodium: 143 mmol/L (ref 135–145)

## 2020-08-12 MED ORDER — ISOSORBIDE DINITRATE 20 MG PO TABS: 40.0000 mg | ORAL_TABLET | Freq: Three times a day (TID) | ORAL | 3 refills | Status: DC

## 2020-08-12 NOTE — Progress Notes (Signed)
Paramedicine Encounter    Patient ID: Kaye Mitro, male    DOB: 10-10-1960, 60 y.o.   MRN: [id number]  Arrived for clinic visit with Wash who was alert and oriented reporting to be feeling okay. Deran reports no swelling, shortness of breathing. Tashaun did report having some trouble with hallucinations since starting the Fenofibrate in January. Boyce Medici PA suggested to hold this medication for the next two weeks until next home visit and re-evaluate same. Sears agreed with plan. No other med changes made today. Labs will be drawn today. I filled 2 pill boxes for Sael and missing items as noted below, written instructions given to Zaion and explained and he understood and could read them without issue.   Pill box #1  DIGOXIN- ALL DAYS   ASA THURSDAY-MORN  ISOSORBIDE ALL DAYS -TID   BENADRYL WEDS MORN DOSE, Thursday [facility] AND PM DOSE.  Pill box #2  DIGOXIN- ALL DAYS   ASA ALL DAYS -MORN   ISOSORBIDE ALL DAYS- TID  BENADRYL ALL DAYS AND NIGHTS  Refills: Omeprazole Torsemide Pregabalin Cymbalta  Digoxin   ACTION: Home visit completed Next visit planned for two weeks

## 2020-08-12 NOTE — Patient Instructions (Signed)
It was great to see you today! No medication changes are needed at this time.  Labs today We will only contact you if something comes back abnormal or we need to make some changes. Otherwise no news is good news!  Your physician recommends that you schedule a follow-up appointment in: 3 months with Dr Shirlee Latch   Do the following things EVERYDAY: 1) Weigh yourself in the morning before breakfast. Write it down and keep it in a log. 2) Take your medicines as prescribed 3) Eat low salt foods--Limit salt (sodium) to 2000 mg per day.  4) Stay as active as you can everyday 5) Limit all fluids for the day to less than 2 liters  At the [facility], you and your health needs are our priority. As part of our continuing mission to provide you with exceptional heart care, we have created designated Provider Care Teams. These Care Teams include your primary Cardiologist (physician) and Advanced Practice Providers (APPs- Physician Assistants and Nurse Practitioners) who all work together to provide you with the care you need, when you need it.   You may see any of the following providers on your designated Care Team at your next follow up: Marland Kitchen Dr Arvilla Meres . Dr Marca Ancona . Dr Thornell Mule . Tonye Becket, NP . Robbie Lis, PA . Shanda Bumps [place],NP . Karle Plumber, PharmD   Please be sure to bring in all your medications bottles to every appointment.

## 2020-08-12 NOTE — Progress Notes (Signed)
Advanced Heart Failure Clinic Note   Referring Physician: PCP: Ananias Pilgrim, MD PCP-Cardiologist: Dr. Shirlee Latch    HPI:  Patient has a history of chronic systolic HF s/p Medtronic ICD (12/2016), HTN, DM, and HLD.   Admitted 5/8-5/14/19 with A/C systolic HF. Echo showed EF 15-20%. Advanced HF team was consulted. He diuresed 20 lbs with IV lasix, then transitioned to torsemide 40 mg daily. Underwent R/LHC showing a focal severe RCA stenosis treated with DES.  He did not, however, appear to have severe enough coronary disease to explain his cardiomyopathy. Cardiac index was low at 1.9.  HF meds were optimized. Beta blocker was not started due to low CI and soft BPs. He was discharged on home oxygen. Referred to cardiac rehab. DC weight: 260 lbs.   Echo in 2/20 showed EF 20-25% with severe LV dilation, moderate RV enlargement with moderately decreased RV systolic function.  CPX in 2/21 showed a severe functional limitation but it actually appeared to be primarily due to pulmonary issues.   Echo 1/22 showed EF < 20% with severe LV dilation, normal RV size with moderately decreased systolic function, dilated IVC.   Recently seen 1/13 and was mildly fluid overloaded. Torsemide was increased to 40 mg daily. Hydralazine and Imdur also increased for better BP control.   He returns to clinic today for f/u. Here w/ paramedicine. Doing well. Volume improved. Euvolemic on exam and by Optivol. Tolerating med increased ok. No orthostatic symptoms. BP well controlled. Denies CP. His only complaint is hallucinations which he attributes to fenofibrate, as symptoms started when he began medications. Of note, he did not tolerate Vascepa previously due to "bloody stools".    Review of Systems: [y] = yes, [ ]  = no   General: Weight gain [ ] ; Weight loss [ ] ; Anorexia [ ] ; Fatigue [ ] ; Fever [ ] ; Chills [ ] ; Weakness [ ]   Cardiac: Chest pain/pressure [ ] ; Resting SOB [ ] ; Exertional SOB [ ] ; Orthopnea [ ] ; Pedal  Edema [ ] ; Palpitations [ ] ; Syncope [ ] ; Presyncope [ ] ; Paroxysmal nocturnal dyspnea[ ]   Pulmonary: Cough [ ] ; Wheezing[ ] ; Hemoptysis[ ] ; Sputum [ ] ; Snoring [ ]   GI: Vomiting[ ] ; Dysphagia[ ] ; Melena[ ] ; Hematochezia [ ] ; Heartburn[ ] ; Abdominal pain [ ] ; Constipation [ ] ; Diarrhea [ ] ; BRBPR [ ]   GU: Hematuria[ ] ; Dysuria [ ] ; Nocturia[ ]   Vascular: Pain in legs with walking [ ] ; Pain in feet with lying flat [ ] ; Non-healing sores [ ] ; Stroke [ ] ; TIA [ ] ; Slurred speech [ ] ;  Neuro: Headaches[ ] ; Vertigo[ ] ; Seizures[ ] ; Paresthesias[ ] ;Blurred vision [ ] ; Diplopia [ ] ; Vision changes [ ]   Ortho/Skin: Arthritis [ ] ; Joint pain [ ] ; Muscle pain [ ] ; Joint swelling [ ] ; Back Pain [ ] ; Rash [ ]   Psych: Depression[ ] ; Anxiety[ ]   Heme: Bleeding problems [ ] ; Clotting disorders [ ] ; Anemia [ ]   Endocrine: Diabetes [ ] ; Thyroid dysfunction[ ]    Past Medical History:  Diagnosis Date  . AICD (automatic cardioverter/defibrillator) present 01/11/2017  . Anxiety   . CHF (congestive heart failure) (HCC)   . CKD (chronic kidney disease)   . COPD (chronic obstructive pulmonary disease) (HCC)    never smoker, industrial exposure  . Diabetes mellitus with diabetic neuropathy, with long-term current use of insulin (HCC)   . GERD (gastroesophageal reflux disease)   . High cholesterol   . History of gout    "take RX qd" (11/01/2017)  .  Hypertension   . Nonobstructive atherosclerosis of coronary artery   . On home oxygen therapy    "2L prn" (11/01/2017)  . 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HCC)    LLL, present for years  . Systolic HF (heart failure) (HCC)     Current Outpatient Medications  Medication Sig Dispense Refill  . acetaminophen (TYLENOL) 650 MG CR tablet Take 650 mg by mouth every 8 (eight) hours as needed for pain.    Marland Kitchen allopurinol (ZYLOPRIM) 100 MG tablet Take 1 tablet (100 mg total) by mouth daily. 30 tablet 3  . aspirin 81 MG chewable tablet Chew 81 mg by mouth daily.    Marland Kitchen atorvastatin  (LIPITOR) 40 MG tablet TAKE 1 TABLET(40 MG) BY MOUTH DAILY AT 6 PM 90 tablet 3  . bisacodyl (DULCOLAX) 5 MG EC tablet Take 5 mg by mouth daily as needed for moderate constipation.    . digoxin (LANOXIN) 0.125 MG tablet TAKE 1 TABLET(0.125 MG) BY MOUTH DAILY 30 tablet 6  . DULoxetine (CYMBALTA) 60 MG capsule Take 60 mg by mouth daily.  2  . eplerenone (INSPRA) 50 MG tablet Take 1 tablet (50 mg total) by mouth daily. 30 tablet 6  . fenofibrate (TRICOR) 145 MG tablet Take 1 tablet (145 mg total) by mouth daily. 30 tablet 11  . hydrALAZINE (APRESOLINE) 50 MG tablet Take 1.5 tablets (75 mg total) by mouth 3 (three) times daily. 135 tablet 3  . Insulin Pen Needle (NOVOFINE) 30G X 8 MM MISC Inject 10 each into the skin as needed. 100 each 0  . insulin regular human CONCENTRATED (HUMULIN R) 500 UNIT/ML kwikpen USE AS DIRECTED PER SLIDING SCALE. MAX DAILY DOSE IS 400 UNITS.    Marland Kitchen JARDIANCE 25 MG TABS tablet Take 25 mg by mouth daily. 30 tablet 6  . MAGNESIUM-OXIDE 400 (241.3 Mg) MG tablet Take 1 tablet (400 mg total) by mouth daily. 30 tablet 11  . omeprazole (PRILOSEC) 20 MG capsule Take 20 mg by mouth daily.    . Potassium Chloride ER 20 MEQ TBCR As directed by CHF clinic. 10 tablet 0  . pregabalin (LYRICA) 100 MG capsule Take 1 capsule (100 mg total) by mouth 2 (two) times daily. 60 capsule 3  . sacubitril-valsartan (ENTRESTO) 97-103 MG Take 1 tablet by mouth 2 (two) times daily. 180 tablet 3  . torsemide (DEMADEX) 20 MG tablet Take 2 tablets (40 mg total) by mouth daily. 60 tablet 3  . isosorbide dinitrate (ISORDIL) 20 MG tablet Take 2 tablets (40 mg total) by mouth 3 (three) times daily. 180 tablet 3   No current facility-administered medications for this encounter.    Allergies  Allergen Reactions  . Codeine   . Coconut Oil Itching      Social History   Socioeconomic History  . Marital status: Divorced    Spouse name: Not on file  . Number of children: Not on file  . Years of education:  Not on file  . Highest education level: Not on file  Occupational History  . Occupation: disability    Comment: stopped working in 2000 d/t occupational exposures  Tobacco Use  . Smoking status: Former Smoker    Types: Cigarettes  . Smokeless tobacco: Never Used  . Tobacco comment: "smoked when I drank"  Vaping Use  . Vaping Use: Never used  Substance and Sexual Activity  . 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 Drug use:  Never  . Sexual activity: Not Currently  Other Topics Concern  . Not on file  Social History Narrative   Patient lives at home with wife and some of his 10 children. He self-administers his own medications.   Social Determinants of Health   Financial Resource Strain: Not on file  Food Insecurity: Not on file  Transportation Needs: Not on file  Physical Activity: Not on file  Stress: Not on file  Social Connections: Not on file  Intimate Partner Violence: Not on file      Family History  Problem Relation Age of Onset  . Hypertension Mother   . Diabetes Mother   . Hypertension Father   . Diabetes Father   . Diabetes Sister   . Diabetes Brother     Vitals:   08/12/20 1357  BP: 131/84  Pulse: 90  SpO2: 93%  Weight: 125.3 kg (276 lb 3.2 oz)   Optivol: Impedence/ Fluid index c/w euvolemia. No VT/VF.    PHYSICAL EXAM: General:  Well appearing. No respiratory difficulty HEENT: normal Neck: supple. no JVD. Carotids 2+ bilat; no bruits. No lymphadenopathy or thyromegaly appreciated. Cor: PMI nondisplaced. Regular rate & rhythm. No rubs, gallops or murmurs. Lungs: clear Abdomen: obese, soft, nontender, nondistended. No hepatosplenomegaly. No bruits or masses. Good bowel sounds. Extremities: no cyanosis, clubbing, rash, no edema,+ chronic venous stasis dermatitis.  Neuro: alert & oriented x 3, cranial nerves grossly intact. moves all 4 extremities w/o difficulty. Affect pleasant.  ECG: not  performed.    ASSESSMENT & PLAN:  1. Chronic systolic CHF:  Primarily nonischemic cardiomyopathy. Echo in 2017 with EF 15-20%. Medtronic ICD. Echo 10/2017 EF 15-20%, severely dilated RV with severely decreased systolic function. LHC/RHC 11/06/17 showed volume overload with 80% RCA stenosis. Cardiac index low at 1.91. The degree of coronary disease does not explain his cardiomyopathy.  Echo in 2/20 showed severe LV dilation, EF 20-25%, moderately decreased RV systolic function.  PYP scan in 7/21 not suggestive of transthyretin amyloidosis. Echo 1/22 showed EF < 20%, moderately decreased RV systolic function.  NYHA class II-III symptoms.  Euvolemic on exam and by Optival Fluid Analysis - Continue torsemide 40 mg daily  - Continue Entresto 97/103 mg BID.  - Continue eplerenone 50 mg daily.   -Continue hydralazine 75 mg tid and isordil 40 mg tid.  - Continue digoxin 0.125 mg daily. Check Dig level today  - Unable to tolerate bisoprolol due to dyspnea.  - Continue empagliflozin.  - Check BMP today  - Discussed low sodium diet and daily wts  2. COPD: Never smoked, but apparently had significant occupational exposure. It appears that he won a lawsuit dealing with the occupational exposure-related COPD. CPX in 2/21 showed severe functional limitation, but it appeared to be due to pulmonary abnormalities rather than HF.  - Followup with pulmonary.   3. CAD:  LHC in 5/19 with 80-90% proximal RCA stenosis treated with DES to RCA.  This did not cause his cardiomyopathy but is a large RCA.  Stable w/o CP - Continue ASA 81 daily.  - Continue atorvastatin, good LDL in 4/21.  4. Polycythemia: Likely related to chronic hypoxia.  5. OSA: Cannot tolerate CPAP, wears oxygen at night.  6. DM: He is on empagliflozin.   - followed by PCP 7. Hypertriglyceridemia: Off Vascepa due to blood in stool that resolved.  - reports intolerance to fenofibrate (hallicinations) - last lipid panel 11/21 showed TG down from  500 >>144 - will stop fenofibrate x 2 weeks.  If hallucinations do not resolve, will restart - if symptoms resolve, will completely discontinue and will need repeat lipid panel to reassess levels off therapy. Pt will need to attempt control through diet and exercise. Encouraged 30-60 min of walking daily + low fat/ low carb diet  - Paramedicine will update [place] on symptom improvement    F/u w/ Dr. Shirlee Latch in 3-4 months    Robbie Lis, PA-C 08/12/20

## 2020-08-26 ENCOUNTER — Other Ambulatory Visit (HOSPITAL_COMMUNITY): Payer: Self-pay
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
## 2020-08-27 ENCOUNTER — Other Ambulatory Visit (HOSPITAL_COMMUNITY): Payer: Self-pay | Admitting: *Deleted

## 2020-08-27 MED ORDER — MAGNESIUM-OXIDE 400 (241.3 MG) MG PO TABS: 400.0000 mg | ORAL_TABLET | Freq: Every day | ORAL | 11 refills | Status: DC

## 2020-08-28 IMAGING — DX DG CHEST 2V
2 series · 2 of 2 positions shown · non-contrast
Comparison: October 31, 2017

CLINICAL DATA: Cough and congestion

EXAM:
CHEST - 2 VIEW

[w chest pa]
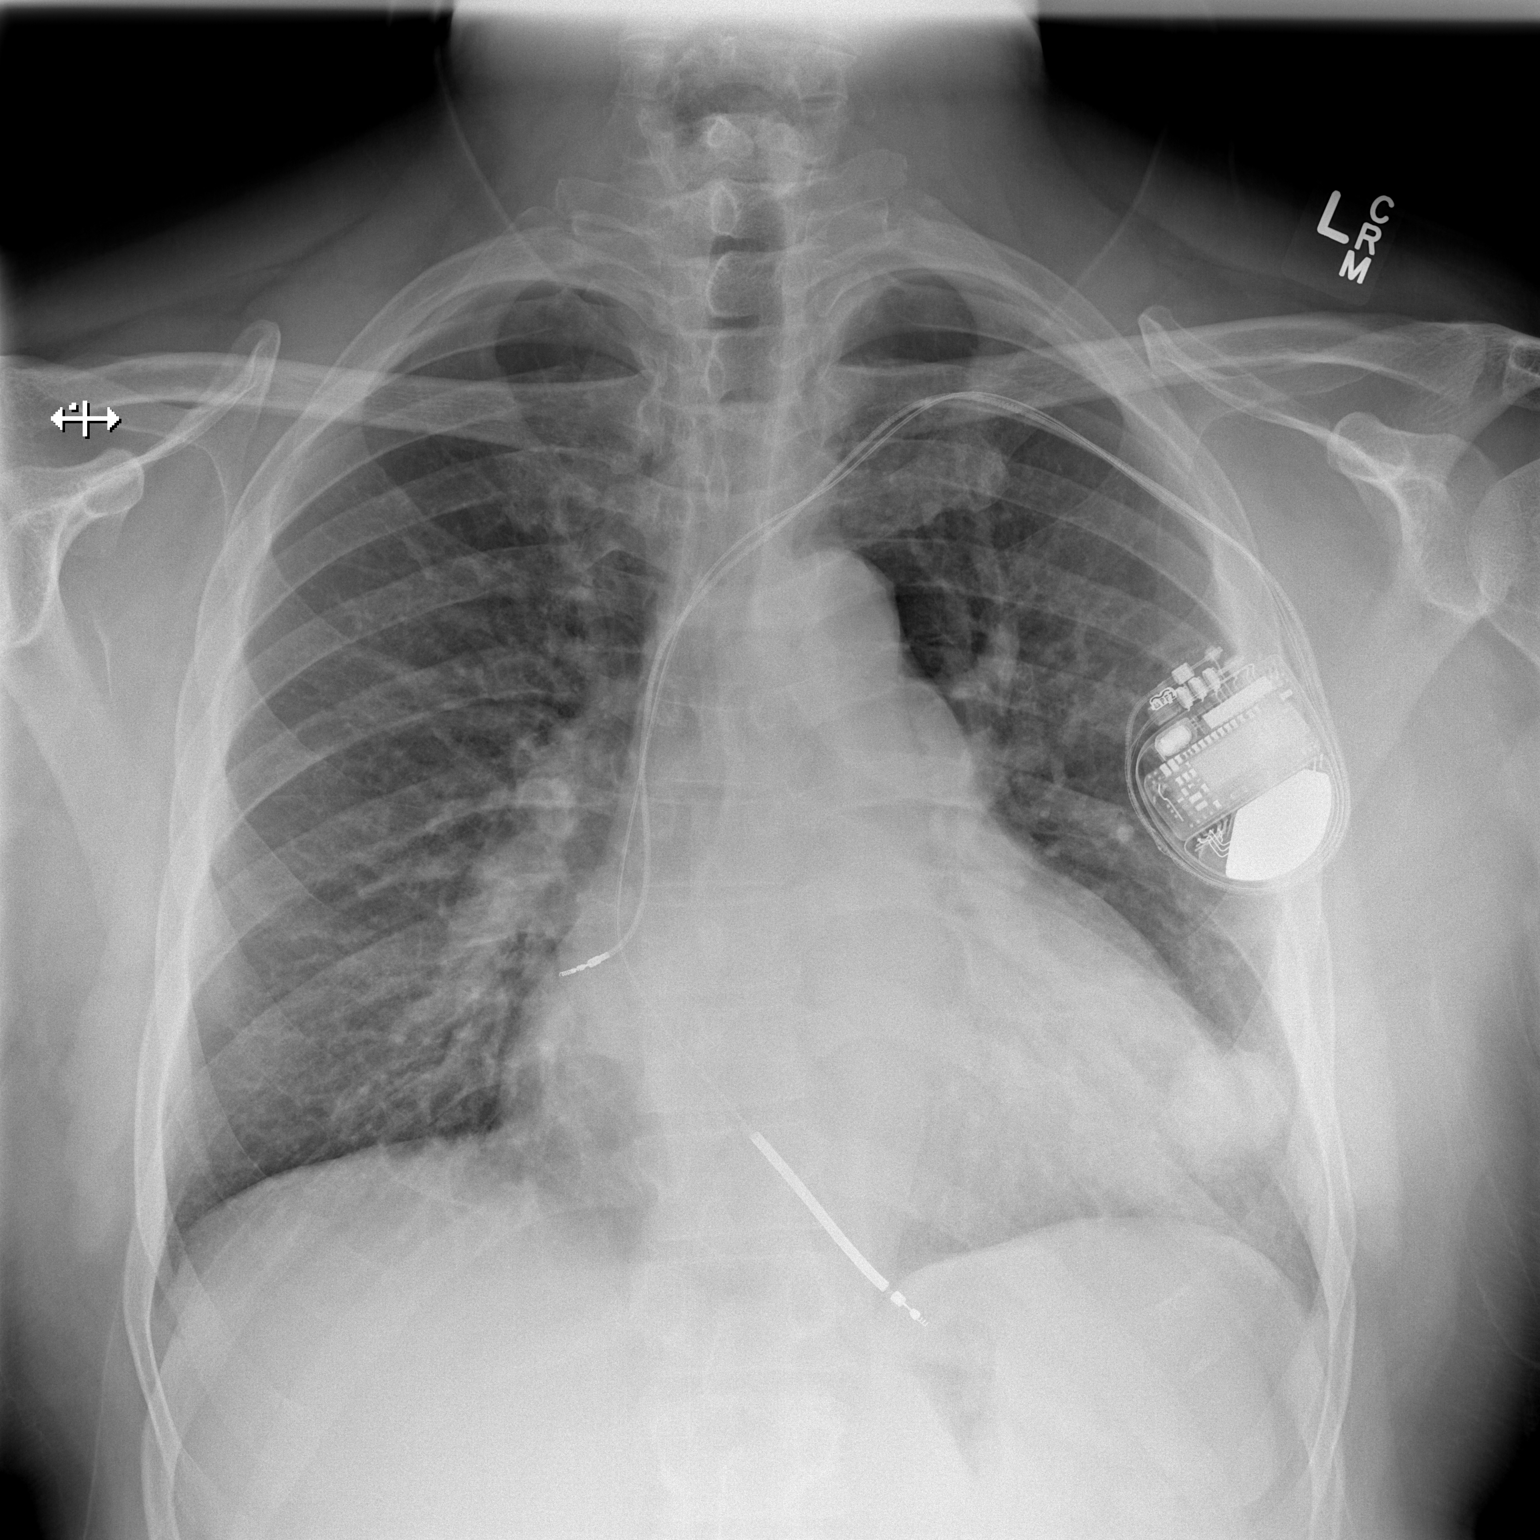

[w chest lat]
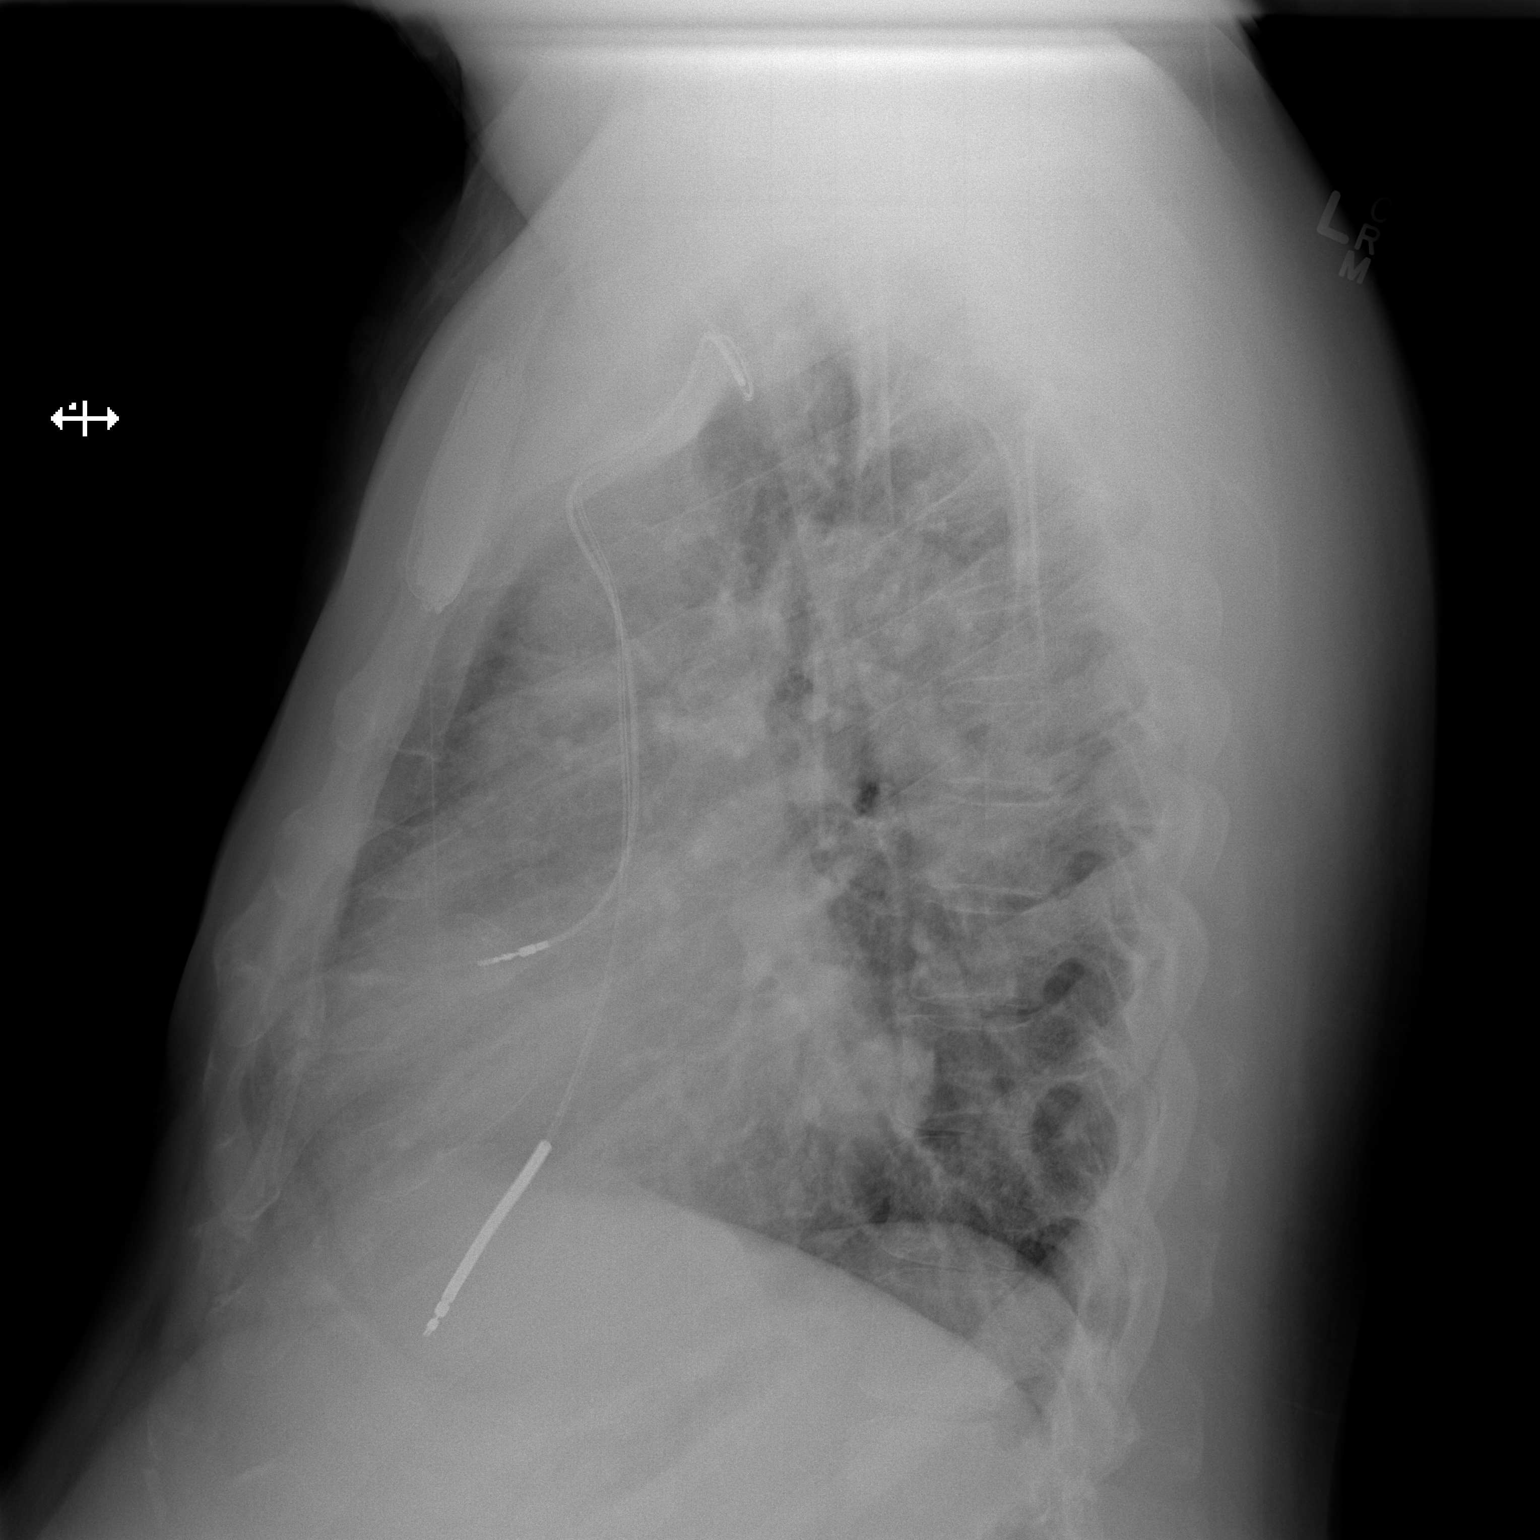

[2 of 2 positions shown; findings below may reference images not displayed]

FINDINGS: There is a nodular opacity, felt to represent a mass in left lower
lobe measuring 3.5 x 3.3 x 3.0 cm. The lungs elsewhere are clear.
The heart is mildly enlarged with pulmonary venous hypertension.
Pacemaker leads are attached to the right atrium and right
ventricle. No adenopathy. No bone lesions.
IMPRESSION: 1. Apparent mass left lower lobe measuring 3.5 x 3.3 x 3.0 cm.
Advise correlation with chest CT, ideally with intravenous contrast
to further assess.

2. Pulmonary vascular congestion without edema or consolidation.
Pacemaker leads attached to right atrium and right ventricle.

3.  No evident adenopathy by radiography.

## 2020-08-31 ENCOUNTER — Ambulatory Visit (INDEPENDENT_AMBULATORY_CARE_PROVIDER_SITE_OTHER): Payer: Medicare Other

## 2020-08-31 DIAGNOSIS — I428 Other cardiomyopathies: Secondary | ICD-10-CM | POA: Diagnosis not present

## 2020-08-31 LAB — CUP PACEART REMOTE DEVICE CHECK
Battery Remaining Longevity: 82 mo
Battery Voltage: 2.99 V
Brady Statistic AP VP Percent: 0 %
Brady Statistic AP VS Percent: 0.01 %
Brady Statistic AS VP Percent: 0.04 %
Brady Statistic AS VS Percent: 99.95 %
Brady Statistic RA Percent Paced: 0.01 %
Brady Statistic RV Percent Paced: 0.04 %
Date Time Interrogation Session: 20220308033625
HighPow Impedance: 79 Ohm
Implantable Lead Implant Date: 20180719
Implantable Lead Implant Date: 20180719
Implantable Lead Location: 753859
Implantable Lead Location: 753860
Implantable Lead Model: 5076
Implantable Pulse Generator Implant Date: 20180719
Lead Channel Impedance Value: 342 Ohm
Lead Channel Impedance Value: 456 Ohm
Lead Channel Impedance Value: 456 Ohm
Lead Channel Pacing Threshold Amplitude: 0.75 V
Lead Channel Pacing Threshold Amplitude: 0.875 V
Lead Channel Pacing Threshold Pulse Width: 0.4 ms
Lead Channel Pacing Threshold Pulse Width: 0.4 ms
Lead Channel Sensing Intrinsic Amplitude: 4.625 mV
Lead Channel Sensing Intrinsic Amplitude: 4.625 mV
Lead Channel Sensing Intrinsic Amplitude: 8.625 mV
Lead Channel Sensing Intrinsic Amplitude: 8.625 mV
Lead Channel Setting Pacing Amplitude: 2 V
Lead Channel Setting Pacing Amplitude: 2 V
Lead Channel Setting Pacing Pulse Width: 0.4 ms
Lead Channel Setting Sensing Sensitivity: 0.3 mV

## 2020-09-06 ENCOUNTER — Other Ambulatory Visit (HOSPITAL_COMMUNITY): Payer: Self-pay | Admitting: *Deleted

## 2020-09-06 ENCOUNTER — Telehealth (HOSPITAL_COMMUNITY): Payer: Self-pay

## 2020-09-06 MED ORDER — TORSEMIDE 20 MG PO TABS: 40.0000 mg | ORAL_TABLET | Freq: Every day | ORAL | 3 refills | Status: DC

## 2020-09-06 NOTE — Telephone Encounter (Signed)
I received a call from Rowe Pavy regarding medications for Richard [name]. They advised they went to pick up torsemide from the pharmacy but the pharmacy advised they did not have any on file. I contacted th HF clinic and had Ceasar Lund, CMA, send in a new prescription to the [facility] on [address] in [place]. I relayed this information to Richard [name] and he advised he would get it as soon as possible.   [name], EMT 09/06/20

## 2020-09-08 NOTE — Progress Notes (Signed)
Remote ICD transmission.   

## 2020-09-09 ENCOUNTER — Other Ambulatory Visit (HOSPITAL_COMMUNITY): Payer: Self-pay | Admitting: *Deleted

## 2020-09-09 ENCOUNTER — Other Ambulatory Visit (HOSPITAL_COMMUNITY): Payer: Self-pay

## 2020-09-09 MED ORDER — HYDRALAZINE HCL 50 MG PO TABS: 75.0000 mg | ORAL_TABLET | Freq: Three times a day (TID) | ORAL | 3 refills | Status: DC

## 2020-09-09 NOTE — Progress Notes (Signed)
Paramedicine Encounter    Patient ID: Richard Richard [name], male    DOB: September 20, 1960, 60 y.o.   MRN: [id number]   Patient Care Team: Ananias Pilgrim, MD as PCP - General (Family Medicine) Laurey Morale, MD as PCP - Advanced Heart Failure (Cardiology) Burna Sis, LCSW as Social Worker (Licensed Clinical Social Worker)  Patient Active Problem List   Diagnosis Date Noted  . Acute on chronic combined systolic and diastolic CHF (congestive heart failure) (HCC) 03/15/2018  . S/P right and left heart catheterization 11/02/2017  . Panic attacks 11/02/2017  . Non-STEMI (non-ST elevated myocardial infarction) (HCC)   . COPD (chronic obstructive pulmonary disease) (HCC)   . Hypertension   . CKD (chronic kidney disease)   . 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HCC)   . ICD (implantable cardioverter-defibrillator) in place   . GERD (gastroesophageal reflux disease)   . Diabetes mellitus with diabetic neuropathy, with long-term current use of insulin (HCC)     Current Outpatient Medications:  .  acetaminophen (TYLENOL) 650 MG CR tablet, Take 650 mg by mouth every 8 (eight) hours as needed for pain., Disp: , Rfl:  .  allopurinol (ZYLOPRIM) 100 MG tablet, Take 1 tablet (100 mg total) by mouth daily., Disp: 30 tablet, Rfl: 3 .  aspirin 81 MG chewable tablet, Chew 81 mg by mouth daily., Disp: , Rfl:  .  atorvastatin (LIPITOR) 40 MG tablet, TAKE 1 TABLET(40 MG) BY MOUTH DAILY AT 6 PM, Disp: 90 tablet, Rfl: 3 .  bisacodyl (DULCOLAX) 5 MG EC tablet, Take 5 mg by mouth daily as needed for moderate constipation., Disp: , Rfl:  .  digoxin (LANOXIN) 0.125 MG tablet, TAKE 1 TABLET(0.125 MG) BY MOUTH DAILY, Disp: 30 tablet, Rfl: 6 .  DULoxetine (CYMBALTA) 60 MG capsule, Take 60 mg by mouth daily., Disp: , Rfl: 2 .  eplerenone (INSPRA) 50 MG tablet, Take 1 tablet (50 mg total) by mouth daily., Disp: 30 tablet, Rfl: 6 .  fenofibrate (TRICOR) 145 MG tablet, Take 1 tablet (145 mg total) by mouth daily. (Patient not taking:  Reported on 08/26/2020), Disp: 30 tablet, Rfl: 11 .  hydrALAZINE (APRESOLINE) 50 MG tablet, Take 1.5 tablets (75 mg total) by mouth 3 (three) times daily., Disp: 135 tablet, Rfl: 3 .  Insulin Pen Needle (NOVOFINE) 30G X 8 MM MISC, Inject 10 each into the skin as needed., Disp: 100 each, Rfl: 0 .  insulin regular human CONCENTRATED (HUMULIN R) 500 UNIT/ML kwikpen, USE AS DIRECTED PER SLIDING SCALE. MAX DAILY DOSE IS 400 UNITS., Disp: , Rfl:  .  isosorbide dinitrate (ISORDIL) 20 MG tablet, Take 2 tablets (40 mg total) by mouth 3 (three) times daily., Disp: 180 tablet, Rfl: 3 .  JARDIANCE 25 MG TABS tablet, Take 25 mg by mouth daily., Disp: 30 tablet, Rfl: 6 .  MAGNESIUM-OXIDE 400 (241.3 Mg) MG tablet, Take 1 tablet (400 mg total) by mouth daily., Disp: 30 tablet, Rfl: 11 .  omeprazole (PRILOSEC) 20 MG capsule, Take 20 mg by mouth daily. (Patient not taking: Reported on 08/26/2020), Disp: , Rfl:  .  Potassium Chloride ER 20 MEQ TBCR, As directed by CHF clinic. (Patient not taking: Reported on 08/26/2020), Disp: 10 tablet, Rfl: 0 .  pregabalin (LYRICA) 100 MG capsule, Take 1 capsule (100 mg total) by mouth 2 (two) times daily., Disp: 60 capsule, Rfl: 3 .  sacubitril-valsartan (ENTRESTO) 97-103 MG, Take 1 tablet by mouth 2 (two) times daily., Disp: 180 tablet, Rfl: 3 .  torsemide (DEMADEX) 20 MG  tablet, Take 2 tablets (40 mg total) by mouth daily., Disp: 60 tablet, Rfl: 3 Allergies  Allergen Reactions  . Codeine   . Coconut Oil Itching     Social History   Socioeconomic History  . Marital status: Divorced    Spouse name: Not on file  . Number of children: Not on file  . Years of education: Not on file  . Highest education level: Not on file  Occupational History  . Occupation: disability    Comment: stopped working in 2000 d/t occupational exposures  Tobacco Use  . Smoking status: Former Smoker    Types: Cigarettes  . Smokeless tobacco: Never Used  . Tobacco comment: "smoked when Richard [name] drank"   Vaping Use  . Vaping Use: Never used  Substance and Sexual Activity  . 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 Drug use: Never  . Sexual activity: Not Currently  Other Topics Concern  . Not on file  Social History Narrative   Patient lives at home with wife and some of his 10 children. He self-administers his own medications.   Social Determinants of Health   Financial Resource Strain: Not on file  Food Insecurity: Not on file  Transportation Needs: Not on file  Physical Activity: Not on file  Stress: Not on file  Social Connections: Not on file  Intimate Partner Violence: Not on file    Physical Exam Vitals reviewed.  Constitutional:      Appearance: Normal appearance. He is normal weight.  HENT:     Head: Normocephalic.     Nose: Nose normal.     Mouth/Throat:     Mouth: Mucous membranes are moist.     Pharynx: Oropharynx is clear.  Eyes:     Conjunctiva/sclera: Conjunctivae normal.     Pupils: Pupils are equal, round, and reactive to light.  Cardiovascular:     Rate and Rhythm: Normal rate and regular rhythm.     Pulses: Normal pulses.     Heart sounds: Normal heart sounds.  Pulmonary:     Effort: Pulmonary effort is normal.     Breath sounds: Normal breath sounds.  Abdominal:     General: There is distension.  Musculoskeletal:        General: No swelling. Normal range of motion.     Cervical back: Normal range of motion.     Right lower leg: No edema.     Left lower leg: No edema.  Skin:    General: Skin is warm and dry.  Neurological:     General: No focal deficit present.     Mental Status: He is alert. Mental status is at baseline.  Psychiatric:        Mood and Affect: Mood normal.        Thought Content: Thought content normal.        Judgment: Judgment normal.     Arrived for home visit for Richard Richard [name] who was alert and oriented reporting to be feeling good. Richard Richard [name] was compliant with his  medications over the last two weeks. Richard Richard [name] denies any shortness of breath, chest pain, dizziness or trouble sleeping, eating or doing daily activities. Richard Richard [name] follows up with his PCP in two weeks. Richard [name] obtained vitals and they are as noted. Richard Richard [name] and Richard [name] reviewed medications and Richard [name] filled one pill box for him.   Richard Richard [name] spoke about moving his medicines to [facility] for bubble  pack delivery and he was agreeable and excited about the plan.   Richard [name] took remaining prescriptions to [facility] and confirmed his chart medications with what the bubble packs should reflect. Pharmacist Richard Richard [name] confirmed same and reports the bubble packs should be ready today or tomorrow for delivery.   Richard Richard [name] and Richard [name] discussed discharge from paramedicine program and he is agreeable and understands if a need or concern arises he can reach out to me, Richard Richard [name] or clinic staff.   Richard Richard [name] and Richard [name] reviewed upcoming appointments and confirmed same. Richard Richard [name] agreeable with plan. Richard [name] will reach out to confirm bubble packs were delivered and they are correct in a week. Then discharge will be official. Home visit complete. Richard [name] will reach out for a phone call in one week.   CBG- 130    Future Appointments  Date Time Provider Department Center  11/09/2020  2:20 PM Laurey Morale, MD [facility] None  11/30/2020  8:10 AM [facility] DEVICE REMOTES [facility]  03/01/2021  8:10 AM [facility] DEVICE REMOTES [facility]  05/31/2021  8:10 AM [facility] DEVICE REMOTES [facility]  08/30/2021  8:10 AM [facility] DEVICE REMOTES [facility]  11/29/2021  8:10 AM [facility] DEVICE REMOTES [facility]     ACTION: Home visit completed Next visit planned for phone call in one week

## 2020-09-15 ENCOUNTER — Telehealth (HOSPITAL_COMMUNITY): Payer: Self-pay

## 2020-09-15 NOTE — Telephone Encounter (Signed)
Spoke to [place] who reports he received his bubble packs and is correct according to list I left for him to verify same. Deklen is appreciative of assistance and agrees with discharge plan. Staci understands to reach out if needed. Call complete. Patient discharged from paramedicine program as of 09/15/20.  Patient is now discharged from [name].  Patient has/has not met the following goals:  Yes :Patient expresses basic understanding of medications and what they are for Yes :Patient able to verbalize heart failure specific dietary/fluid restrictions Yes :Patient is aware of who to call if they have medical concerns or if they need to schedule or change appts Yes :Patient has a scale for daily weights and weighs regularly Yes :Patient able to verbalize concerning symptoms when they should call the HF clinic (weight gain ranges, etc) Yes :Patient has a PCP and has seen within the past year or has upcoming appt Yes :Patient has reliable access to getting their medications Yes :Patient has shown they are able to reorder medications reliably Yes :Patient has had admission in past 30 days- if yes how many? Yes :Patient has had admission in past 90 days- if yes how many?  Discharge Comments:

## 2020-09-16 ENCOUNTER — Telehealth (HOSPITAL_COMMUNITY): Payer: Self-pay

## 2020-09-16 NOTE — Telephone Encounter (Signed)
Spoke to pharmacy and they confirmed they will send out Humulin Pen and Pen Needles once they received RX.   Spoke to [facility] and they will fax over RX to [facility] today for same.   [place] updated.  Discharge complete.

## 2020-10-08 ENCOUNTER — Other Ambulatory Visit (HOSPITAL_COMMUNITY): Payer: Self-pay | Admitting: Cardiology

## 2020-10-14 ENCOUNTER — Telehealth (HOSPITAL_COMMUNITY): Payer: Self-pay

## 2020-10-14 NOTE — Telephone Encounter (Signed)
Increase torsemide to 40 mg bid x 3 days then back to 40 mg daily.  Arrange followup with APP or me.

## 2020-10-14 NOTE — Telephone Encounter (Signed)
Patient called Richard [name] to report that he has had a 3lbs weight gain overnight,not peeing,shortness of breath with walking, bilateral edema. Please advise

## 2020-10-15 NOTE — Telephone Encounter (Signed)
Pt aware and agreeable with plan.  

## 2020-10-16 ENCOUNTER — Other Ambulatory Visit: Payer: Self-pay

## 2020-10-16 ENCOUNTER — Inpatient Hospital Stay (HOSPITAL_COMMUNITY)
Admission: EM | Admit: 2020-10-16 | Discharge: 2020-10-21 | DRG: 291 | Disposition: A | Discharge status: Home / Self Care | Payer: Medicare Other | Attending: Internal Medicine | Admitting: Internal Medicine

## 2020-10-16 ENCOUNTER — Encounter (HOSPITAL_COMMUNITY): Payer: Self-pay | Admitting: Emergency Medicine

## 2020-10-16 ENCOUNTER — Telehealth: Payer: Self-pay | Admitting: Student

## 2020-10-16 ENCOUNTER — Emergency Department (HOSPITAL_COMMUNITY): Payer: Medicare Other

## 2020-10-16 DIAGNOSIS — E1140 Type 2 diabetes mellitus with diabetic neuropathy, unspecified: Secondary | ICD-10-CM

## 2020-10-16 DIAGNOSIS — I252 Old myocardial infarction: Secondary | ICD-10-CM

## 2020-10-16 DIAGNOSIS — I248 Other forms of acute ischemic heart disease: Secondary | ICD-10-CM | POA: Diagnosis present

## 2020-10-16 DIAGNOSIS — I872 Venous insufficiency (chronic) (peripheral): Secondary | ICD-10-CM | POA: Diagnosis present

## 2020-10-16 DIAGNOSIS — I130 Hypertensive heart and chronic kidney disease with heart failure and stage 1 through stage 4 chronic kidney disease, or unspecified chronic kidney disease: Principal | ICD-10-CM | POA: Diagnosis present

## 2020-10-16 DIAGNOSIS — M25561 Pain in right knee: Secondary | ICD-10-CM | POA: Diagnosis present

## 2020-10-16 DIAGNOSIS — M25562 Pain in left knee: Secondary | ICD-10-CM | POA: Diagnosis present

## 2020-10-16 DIAGNOSIS — J9601 Acute respiratory failure with hypoxia: Secondary | ICD-10-CM

## 2020-10-16 DIAGNOSIS — G8929 Other chronic pain: Secondary | ICD-10-CM | POA: Diagnosis present

## 2020-10-16 DIAGNOSIS — N179 Acute kidney failure, unspecified: Secondary | ICD-10-CM | POA: Diagnosis present

## 2020-10-16 DIAGNOSIS — F419 Anxiety disorder, unspecified: Secondary | ICD-10-CM | POA: Diagnosis present

## 2020-10-16 DIAGNOSIS — I5023 Acute on chronic systolic (congestive) heart failure: Secondary | ICD-10-CM | POA: Diagnosis present

## 2020-10-16 DIAGNOSIS — E781 Pure hyperglyceridemia: Secondary | ICD-10-CM | POA: Diagnosis present

## 2020-10-16 DIAGNOSIS — I509 Heart failure, unspecified: Secondary | ICD-10-CM

## 2020-10-16 DIAGNOSIS — I5082 Biventricular heart failure: Secondary | ICD-10-CM | POA: Diagnosis present

## 2020-10-16 DIAGNOSIS — E1165 Type 2 diabetes mellitus with hyperglycemia: Secondary | ICD-10-CM | POA: Diagnosis present

## 2020-10-16 DIAGNOSIS — N189 Chronic kidney disease, unspecified: Secondary | ICD-10-CM | POA: Diagnosis present

## 2020-10-16 DIAGNOSIS — Z95810 Presence of automatic (implantable) cardiac defibrillator: Secondary | ICD-10-CM

## 2020-10-16 DIAGNOSIS — I5043 Acute on chronic combined systolic (congestive) and diastolic (congestive) heart failure: Secondary | ICD-10-CM | POA: Diagnosis present

## 2020-10-16 DIAGNOSIS — M109 Gout, unspecified: Secondary | ICD-10-CM | POA: Diagnosis present

## 2020-10-16 DIAGNOSIS — Z87891 Personal history of nicotine dependence: Secondary | ICD-10-CM

## 2020-10-16 DIAGNOSIS — Z8249 Family history of ischemic heart disease and other diseases of the circulatory system: Secondary | ICD-10-CM

## 2020-10-16 DIAGNOSIS — N1831 Chronic kidney disease, stage 3a: Secondary | ICD-10-CM | POA: Diagnosis not present

## 2020-10-16 DIAGNOSIS — R778 Other specified abnormalities of plasma proteins: Secondary | ICD-10-CM

## 2020-10-16 DIAGNOSIS — K219 Gastro-esophageal reflux disease without esophagitis: Secondary | ICD-10-CM | POA: Diagnosis present

## 2020-10-16 DIAGNOSIS — I428 Other cardiomyopathies: Secondary | ICD-10-CM | POA: Diagnosis present

## 2020-10-16 DIAGNOSIS — Z9981 Dependence on supplemental oxygen: Secondary | ICD-10-CM

## 2020-10-16 DIAGNOSIS — Z79899 Other long term (current) drug therapy: Secondary | ICD-10-CM

## 2020-10-16 DIAGNOSIS — Z9111 Patient's noncompliance with dietary regimen: Secondary | ICD-10-CM

## 2020-10-16 DIAGNOSIS — J449 Chronic obstructive pulmonary disease, unspecified: Secondary | ICD-10-CM | POA: Diagnosis present

## 2020-10-16 DIAGNOSIS — F32A Depression, unspecified: Secondary | ICD-10-CM | POA: Diagnosis present

## 2020-10-16 DIAGNOSIS — Z20822 Contact with and (suspected) exposure to covid-19: Secondary | ICD-10-CM | POA: Diagnosis present

## 2020-10-16 DIAGNOSIS — I10 Essential (primary) hypertension: Secondary | ICD-10-CM | POA: Diagnosis present

## 2020-10-16 DIAGNOSIS — Z955 Presence of coronary angioplasty implant and graft: Secondary | ICD-10-CM

## 2020-10-16 DIAGNOSIS — E1122 Type 2 diabetes mellitus with diabetic chronic kidney disease: Secondary | ICD-10-CM | POA: Diagnosis present

## 2020-10-16 DIAGNOSIS — Z7984 Long term (current) use of oral hypoglycemic drugs: Secondary | ICD-10-CM

## 2020-10-16 DIAGNOSIS — I959 Hypotension, unspecified: Secondary | ICD-10-CM | POA: Diagnosis not present

## 2020-10-16 DIAGNOSIS — I2510 Atherosclerotic heart disease of native coronary artery without angina pectoris: Secondary | ICD-10-CM | POA: Diagnosis present

## 2020-10-16 DIAGNOSIS — Z794 Long term (current) use of insulin: Secondary | ICD-10-CM

## 2020-10-16 DIAGNOSIS — J9611 Chronic respiratory failure with hypoxia: Secondary | ICD-10-CM | POA: Diagnosis present

## 2020-10-16 DIAGNOSIS — D751 Secondary polycythemia: Secondary | ICD-10-CM | POA: Diagnosis present

## 2020-10-16 DIAGNOSIS — G4733 Obstructive sleep apnea (adult) (pediatric): Secondary | ICD-10-CM | POA: Diagnosis present

## 2020-10-16 DIAGNOSIS — E785 Hyperlipidemia, unspecified: Secondary | ICD-10-CM | POA: Diagnosis present

## 2020-10-16 DIAGNOSIS — E7800 Pure hypercholesterolemia, unspecified: Secondary | ICD-10-CM | POA: Diagnosis present

## 2020-10-16 DIAGNOSIS — Z7982 Long term (current) use of aspirin: Secondary | ICD-10-CM

## 2020-10-16 DIAGNOSIS — Z833 Family history of diabetes mellitus: Secondary | ICD-10-CM

## 2020-10-16 LAB — RESP PANEL BY RT-PCR (FLU A&B, COVID) ARPGX2
Influenza A by PCR: NEGATIVE
Influenza B by PCR: NEGATIVE
SARS Coronavirus 2 by RT PCR: NEGATIVE

## 2020-10-16 LAB — COMPREHENSIVE METABOLIC PANEL
ALT: 65 U/L — ABNORMAL HIGH (ref 0–44)
AST: 32 U/L (ref 15–41)
Albumin: 3.5 g/dL (ref 3.5–5.0)
Alkaline Phosphatase: 80 U/L (ref 38–126)
Anion gap: 9 (ref 5–15)
BUN: 29 mg/dL — ABNORMAL HIGH (ref 6–20)
CO2: 39 mmol/L — ABNORMAL HIGH (ref 22–32)
Calcium: 8.8 mg/dL — ABNORMAL LOW (ref 8.9–10.3)
Chloride: 92 mmol/L — ABNORMAL LOW (ref 98–111)
Creatinine, Ser: 1.67 mg/dL — ABNORMAL HIGH (ref 0.61–1.24)
GFR, Estimated: 47 mL/min — ABNORMAL LOW (ref >60)
Glucose, Bld: 179 mg/dL — ABNORMAL HIGH (ref 70–99)
Potassium: 4.1 mmol/L (ref 3.5–5.1)
Sodium: 140 mmol/L (ref 135–145)
Total Bilirubin: 1.1 mg/dL (ref 0.3–1.2)
Total Protein: 7.4 g/dL (ref 6.5–8.1)

## 2020-10-16 LAB — CBC WITH DIFFERENTIAL/PLATELET
Abs Immature Granulocytes: 0.01 10*3/uL (ref 0.00–0.07)
Basophils Absolute: 0 10*3/uL (ref 0.0–0.1)
Basophils Relative: 1 %
Eosinophils Absolute: 0 10*3/uL (ref 0.0–0.5)
Eosinophils Relative: 0 %
HCT: 60.1 % — ABNORMAL HIGH (ref 39.0–52.0)
Hemoglobin: 18.1 g/dL — ABNORMAL HIGH (ref 13.0–17.0)
Immature Granulocytes: 0 %
Lymphocytes Relative: 28 %
Lymphs Abs: 1.8 10*3/uL (ref 0.7–4.0)
MCH: 29.4 pg (ref 26.0–34.0)
MCHC: 30.1 g/dL (ref 30.0–36.0)
MCV: 97.7 fL (ref 80.0–100.0)
Monocytes Absolute: 1.2 10*3/uL — ABNORMAL HIGH (ref 0.1–1.0)
Monocytes Relative: 20 %
Neutro Abs: 3.2 10*3/uL (ref 1.7–7.7)
Neutrophils Relative %: 51 %
Platelets: 270 10*3/uL (ref 150–400)
RBC: 6.15 MIL/uL — ABNORMAL HIGH (ref 4.22–5.81)
RDW: 19.4 % — ABNORMAL HIGH (ref 11.5–15.5)
WBC: 6.3 10*3/uL (ref 4.0–10.5)
nRBC: 0.5 % — ABNORMAL HIGH (ref 0.0–0.2)

## 2020-10-16 LAB — BRAIN NATRIURETIC PEPTIDE: B Natriuretic Peptide: 329.7 pg/mL — ABNORMAL HIGH (ref 0.0–100.0)

## 2020-10-16 LAB — TROPONIN I (HIGH SENSITIVITY)
Troponin I (High Sensitivity): 69 ng/L — ABNORMAL HIGH (ref <18)
Troponin I (High Sensitivity): 74 ng/L — ABNORMAL HIGH (ref <18)

## 2020-10-16 MED ORDER — ACETAMINOPHEN 325 MG PO TABS
650.0000 mg | ORAL_TABLET | Freq: Four times a day (QID) | ORAL | Status: DC | PRN
  Administered 2020-10-17 (×2): 650 mg via ORAL
  Filled 2020-10-16 (×2): qty 2

## 2020-10-16 MED ORDER — ENOXAPARIN SODIUM 40 MG/0.4ML ~~LOC~~ SOLN
40.0000 mg | SUBCUTANEOUS | Status: DC
  Administered 2020-10-17 – 2020-10-21 (×3): 40 mg via SUBCUTANEOUS
  Filled 2020-10-16 (×4): qty 0.4

## 2020-10-16 MED ORDER — FUROSEMIDE 10 MG/ML IJ SOLN
80.0000 mg | Freq: Two times a day (BID) | INTRAMUSCULAR | Status: DC
  Administered 2020-10-17 – 2020-10-21 (×9): 80 mg via INTRAVENOUS
  Filled 2020-10-16 (×9): qty 8

## 2020-10-16 MED ORDER — DULOXETINE HCL 60 MG PO CPEP
60.0000 mg | ORAL_CAPSULE | Freq: Every day | ORAL | Status: DC
  Administered 2020-10-17 – 2020-10-21 (×5): 60 mg via ORAL
  Filled 2020-10-16 (×5): qty 1

## 2020-10-16 MED ORDER — FLUTICASONE FUROATE-VILANTEROL 100-25 MCG/INH IN AEPB
1.0000 | INHALATION_SPRAY | Freq: Every day | RESPIRATORY_TRACT | Status: DC
  Administered 2020-10-17 – 2020-10-21 (×4): 1 via RESPIRATORY_TRACT
  Filled 2020-10-16 (×2): qty 28

## 2020-10-16 MED ORDER — ISOSORBIDE DINITRATE 10 MG PO TABS
40.0000 mg | ORAL_TABLET | Freq: Three times a day (TID) | ORAL | Status: DC
  Administered 2020-10-17 – 2020-10-21 (×14): 40 mg via ORAL
  Filled 2020-10-16 (×3): qty 4
  Filled 2020-10-16: qty 2
  Filled 2020-10-16 (×11): qty 4

## 2020-10-16 MED ORDER — POLYETHYLENE GLYCOL 3350 17 G PO PACK: 17.0000 g | PACK | Freq: Every day | ORAL | Status: DC | PRN

## 2020-10-16 MED ORDER — PANTOPRAZOLE SODIUM 40 MG PO TBEC
40.0000 mg | DELAYED_RELEASE_TABLET | Freq: Every day | ORAL | Status: DC
  Administered 2020-10-17 – 2020-10-21 (×6): 40 mg via ORAL
  Filled 2020-10-16 (×6): qty 1

## 2020-10-16 MED ORDER — ALBUTEROL SULFATE HFA 108 (90 BASE) MCG/ACT IN AERS
1.0000 | INHALATION_SPRAY | RESPIRATORY_TRACT | Status: DC | PRN
Start: 2020-10-16 — End: 2020-10-21

## 2020-10-16 MED ORDER — SODIUM CHLORIDE 0.9% FLUSH
3.0000 mL | Freq: Two times a day (BID) | INTRAVENOUS | Status: DC
  Administered 2020-10-17 – 2020-10-21 (×10): 3 mL via INTRAVENOUS

## 2020-10-16 MED ORDER — SPIRONOLACTONE 25 MG PO TABS
25.0000 mg | ORAL_TABLET | Freq: Every day | ORAL | Status: DC
  Administered 2020-10-17 – 2020-10-21 (×5): 25 mg via ORAL
  Filled 2020-10-16 (×5): qty 1

## 2020-10-16 MED ORDER — ACETAMINOPHEN 650 MG RE SUPP: 650.0000 mg | Freq: Four times a day (QID) | RECTAL | Status: DC | PRN

## 2020-10-16 MED ORDER — BISACODYL 5 MG PO TBEC: 5.0000 mg | DELAYED_RELEASE_TABLET | Freq: Every day | ORAL | Status: DC | PRN

## 2020-10-16 MED ORDER — DIGOXIN 125 MCG PO TABS
0.1250 mg | ORAL_TABLET | Freq: Every day | ORAL | Status: DC
  Administered 2020-10-17 – 2020-10-21 (×5): 0.125 mg via ORAL
  Filled 2020-10-16 (×5): qty 1

## 2020-10-16 MED ORDER — INSULIN ASPART 100 UNIT/ML ~~LOC~~ SOLN
0.0000 [IU] | Freq: Three times a day (TID) | SUBCUTANEOUS | Status: DC
  Administered 2020-10-17: 8 [IU] via SUBCUTANEOUS
  Administered 2020-10-17: 3 [IU] via SUBCUTANEOUS
  Administered 2020-10-17: 5 [IU] via SUBCUTANEOUS
  Administered 2020-10-18: 8 [IU] via SUBCUTANEOUS
  Administered 2020-10-18 (×2): 3 [IU] via SUBCUTANEOUS
  Administered 2020-10-19: 5 [IU] via SUBCUTANEOUS
  Administered 2020-10-19: 3 [IU] via SUBCUTANEOUS
  Administered 2020-10-19: 5 [IU] via SUBCUTANEOUS
  Administered 2020-10-20: 3 [IU] via SUBCUTANEOUS
  Administered 2020-10-20 (×2): 5 [IU] via SUBCUTANEOUS

## 2020-10-16 MED ORDER — SACUBITRIL-VALSARTAN 97-103 MG PO TABS
1.0000 | ORAL_TABLET | Freq: Two times a day (BID) | ORAL | Status: DC
  Administered 2020-10-17 – 2020-10-21 (×9): 1 via ORAL
  Filled 2020-10-16 (×10): qty 1

## 2020-10-16 MED ORDER — FUROSEMIDE 10 MG/ML IJ SOLN
80.0000 mg | Freq: Once | INTRAMUSCULAR | Status: AC
  Administered 2020-10-16: 80 mg via INTRAVENOUS
  Filled 2020-10-16: qty 8

## 2020-10-16 MED ORDER — ATORVASTATIN CALCIUM 40 MG PO TABS
40.0000 mg | ORAL_TABLET | Freq: Every day | ORAL | Status: DC
  Administered 2020-10-17 – 2020-10-21 (×5): 40 mg via ORAL
  Filled 2020-10-16 (×5): qty 1

## 2020-10-16 MED ORDER — ASPIRIN 81 MG PO CHEW
81.0000 mg | CHEWABLE_TABLET | Freq: Every day | ORAL | Status: DC
  Administered 2020-10-17 – 2020-10-21 (×5): 81 mg via ORAL
  Filled 2020-10-16 (×5): qty 1

## 2020-10-16 NOTE — ED Triage Notes (Addendum)
C/o swelling all over for a few weeks- worse lower extremities and abd.  Denies chest pain and SOB.  Reports generalized pain all over.  84-86% on room air.  States he wears oxygen sometimes at home.  Placed on 3 liters [place] and sats 93%.

## 2020-10-16 NOTE — ED Provider Notes (Signed)
[facility] EMERGENCY DEPARTMENT Provider Note   CSN: [id number] Arrival date & time: 10/16/20  1756    History Chief Complaint  Patient presents with  . Edema    Richard [name] is a 60 y.o. male with history significant for CKD, COPD, CHF s/p AICD maker due to EF of 20, hypertension presents for evaluation of fluid overload.  Patient states he has had gradual increase in lower extremity swelling as well as shortness of breath over the last 3 weeks.  Was seen by cardiology a few days ago.  They increase his torsemide.  Patient states he has not had good urine output.  States he is up 20 pounds from his baseline.  His dry weight is 260.  Weight 280 at PCP office yesterday.  Called cardiology today.  Due to no significant increase in diuresis at home recommended admission for IV diuresis.  Patient does state he has generalized myalgias.  No recent COVID exposures.  He has cough which is nonproductive which he states he has at baseline.  He denies any headache, lightheadedness, dizziness.  He has no chest pain, abdominal pain, unilateral weakness.  States he has chronic pain to bilateral knees which is not new.  No recent injury or trauma.  Patient states he has oxygen at home however does not have to typically wear this.  He will occasionally wear this if he feels short of breath but not at baseline.  He sleeps with 3-4 pillows at night.  Was seen by PCP yesterday and noted to have some interstitial infiltrates.  Denies additional aggravating or alleviating factors.  History obtained from patient and past medical records.  No interpreter used.  HPI     Past Medical History:  Diagnosis Date  . AICD (automatic cardioverter/defibrillator) present 01/11/2017  . Anxiety   . CHF (congestive heart failure) (HCC)   . CKD (chronic kidney disease)   . COPD (chronic obstructive pulmonary disease) (HCC)    never smoker, industrial exposure  . Diabetes mellitus with diabetic neuropathy,  with long-term current use of insulin (HCC)   . GERD (gastroesophageal reflux disease)   . High cholesterol   . History of gout    "take RX qd" (11/01/2017)  . Hypertension   . Nonobstructive atherosclerosis of coronary artery   . On home oxygen therapy    "2L prn" (11/01/2017)  . 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HCC)    LLL, present for years  . Systolic HF (heart failure) [facility])     Patient Active Problem List   Diagnosis Date Noted  . Acute on chronic systolic CHF (congestive heart failure) (HCC) 10/16/2020  . Acute on chronic combined systolic and diastolic CHF (congestive heart failure) (HCC) 03/15/2018  . S/P right and left heart catheterization 11/02/2017  . Panic attacks 11/02/2017  . Non-STEMI (non-ST elevated myocardial infarction) (HCC)   . COPD (chronic obstructive pulmonary disease) (HCC)   . Hypertension   . CKD (chronic kidney disease)   . 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HCC)   . ICD (implantable cardioverter-defibrillator) in place   . GERD (gastroesophageal reflux disease)   . Diabetes mellitus with diabetic neuropathy, with long-term current use of insulin [facility])     Past Surgical History:  Procedure Laterality Date  . CATARACT EXTRACTION W/ INTRAOCULAR LENS IMPLANTW/ TRABECULECTOMY Left ~ 2013   "may have done this when I had my other eye OR"  . CIRCUMCISION    . CORONARY STENT INTERVENTION N/A 11/02/2017   Procedure: CORONARY STENT  INTERVENTION;  Surgeon: Laurey Morale, MD;  Location: [facility] INVASIVE CV LAB;  Service: Cardiovascular;  Laterality: N/A;  . CORONARY STENT INTERVENTION N/A 11/02/2017   Procedure: CORONARY STENT INTERVENTION;  Surgeon: Lyn Records, MD;  Location: [facility] INVASIVE CV LAB;  Service: Cardiovascular;  Laterality: N/A;  . EYE SURGERY Left ~ 2013   "fell on something in basement; stuck in my eye"  . ICD IMPLANT     Medtronic Dual Chamber 01/11/17  . MULTIPLE TOOTH EXTRACTIONS     "pulled all my top teeth"  . RIGHT/LEFT HEART CATH AND CORONARY ANGIOGRAPHY  N/A 11/02/2017   Procedure: RIGHT/LEFT HEART CATH AND CORONARY ANGIOGRAPHY;  Surgeon: Laurey Morale, MD;  Location: [facility] INVASIVE CV LAB;  Service: Cardiovascular;  Laterality: N/A;       Family History  Problem Relation Age of Onset  . Hypertension Mother   . Diabetes Mother   . Hypertension Father   . Diabetes Father   . Diabetes Sister   . Diabetes Brother     Social History   Tobacco Use  . Smoking status: Former Smoker    Types: Cigarettes  . Smokeless tobacco: Never Used  . Tobacco comment: "smoked when I drank"  Vaping Use  . Vaping Use: Never used  Substance Use Topics  . 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 Drug use: Never    Home Medications Prior to Admission medications   Medication Sig Start Date End Date Taking? Authorizing Provider  acetaminophen (TYLENOL) 650 MG CR tablet Take 650 mg by mouth every 8 (eight) hours as needed for pain.   Yes [provider]  allopurinol (ZYLOPRIM) 100 MG tablet Take 1 tablet (100 mg total) by mouth daily. 12/26/17  Yes Clegg, Amy D, NP  aspirin 325 MG EC tablet Take 325 mg by mouth daily.   Yes [provider]  aspirin 81 MG chewable tablet Chew 81 mg by mouth daily.   Yes [provider]  atorvastatin (LIPITOR) 40 MG tablet TAKE 1 TABLET(40 MG) BY MOUTH DAILY AT 6 PM 12/18/19  Yes Laurey Morale, MD  docusate sodium (COLACE) 100 MG capsule Take 100 mg by mouth daily as needed for mild constipation.   Yes [provider]  DULoxetine (CYMBALTA) 60 MG capsule Take 60 mg by mouth daily. 09/28/17  Yes [provider]  eplerenone (INSPRA) 50 MG tablet Take 1 tablet (50 mg total) by mouth daily. 07/27/20  Yes Laurey Morale, MD  hydrALAZINE (APRESOLINE) 50 MG tablet Take 1.5 tablets (75 mg total) by mouth 3 (three) times daily. 09/09/20  Yes Laurey Morale, MD  insulin regular human CONCENTRATED (HUMULIN R) 500 UNIT/ML kwikpen  Inject 0-400 Units into the skin as directed. Sliding 07/18/17  Yes [provider]  isosorbide dinitrate (ISORDIL) 20 MG tablet Take 2 tablets (40 mg total) by mouth 3 (three) times daily. 08/12/20  Yes Simmons, Brittainy M, PA-C  JARDIANCE 25 MG TABS tablet Take 25 mg by mouth daily. 10/15/19  Yes Laurey Morale, MD  MAGNESIUM-OXIDE 400 (241.3 Mg) MG tablet Take 1 tablet (400 mg total) by mouth daily. 08/27/20  Yes Laurey Morale, MD  omeprazole (PRILOSEC) 20 MG capsule Take 20 mg by mouth daily.   Yes [provider]  pregabalin (LYRICA) 150 MG capsule Take 150 mg by mouth 3 (three) times daily. 10/07/20  Yes [provider]  sacubitril-valsartan Sherryll Burger)  97-103 MG Take 1 tablet by mouth 2 (two) times daily. 03/17/20  Yes Laurey Morale, MD  SSD 1 % cream Apply 1 application topically daily as needed (skin irritation). 10/15/20  Yes [provider]  torsemide (DEMADEX) 20 MG tablet Take 2 tablets (40 mg total) by mouth daily. 09/06/20  Yes Laurey Morale, MD  bisoprolol (ZEBETA) 5 MG tablet TAKE 1/2 TABLET BY MOUTH DAILY 10/08/20   Laurey Morale, MD  digoxin (LANOXIN) 0.125 MG tablet TAKE 1 TABLET(0.125 MG) BY MOUTH DAILY 04/28/20   Laurey Morale, MD  icosapent Ethyl (VASCEPA) 1 g capsule Take 2 capsules (2 g total) by mouth 2 (two) times daily. 10/08/20   Laurey Morale, MD  Insulin Pen Needle (NOVOFINE) 30G X 8 MM MISC Inject 10 each into the skin as needed. 11/06/17   Arnetha Courser, MD  pregabalin (LYRICA) 100 MG capsule Take 1 capsule (100 mg total) by mouth 2 (two) times daily. Patient not taking: No sig reported 12/26/17   Tonye Becket D, NP    Allergies    Codeine and Coconut oil  Review of Systems   Review of Systems  Constitutional: Positive for activity change and fatigue.  HENT: Negative.   Respiratory: Positive for cough and shortness of breath. Negative for choking and chest tightness.   Cardiovascular: Positive for leg swelling. Negative  for chest pain and palpitations.  Gastrointestinal: Negative.   Genitourinary: Negative.   Musculoskeletal: Positive for myalgias. Negative for arthralgias, back pain, gait problem, joint swelling, neck pain and neck stiffness.  Skin: Negative.   Neurological: Negative.   All other systems reviewed and are negative.   Physical Exam Updated Vital Signs BP (!) 119/91 (BP Location: Left Arm)   Pulse 85   Temp 98.2 F (36.8 C) (Oral)   Resp 20   Wt 124.4 kg   SpO2 93%   BMI 32.52 kg/m   Physical Exam Vitals and nursing note reviewed.  Constitutional:      General: He is not in acute distress.    Appearance: He is well-developed. He is not ill-appearing, toxic-appearing or diaphoretic.  HENT:     Head: Normocephalic and atraumatic.     Nose: Nose normal.     Mouth/Throat:     Mouth: Mucous membranes are moist.  Eyes:     Pupils: Pupils are equal, round, and reactive to light.  Cardiovascular:     Rate and Rhythm: Normal rate and regular rhythm.     Pulses: Normal pulses.          Radial pulses are 2+ on the right side and 2+ on the left side.     Heart sounds: Normal heart sounds.     Comments: Equal rise and fall to chest.  Nontender.  AICD maker to left upper chest wall Pulmonary:     Effort: Pulmonary effort is normal.     Breath sounds: Rales present.     Comments: Tachypnea, 3 L via nasal cannula.  Pauses after short sentences to take of breath has mild crackles at bases Abdominal:     General: Bowel sounds are normal. There is no distension.     Palpations: Abdomen is soft.     Comments: Soft, ventral hernia which is nontender.  No evidence of anasarca  Musculoskeletal:        General: Normal range of motion.     Cervical back: Normal range of motion and neck supple.     Comments: No bony  tenderness.  Moves all 4 extremities without difficulty.  2+ pitting edema to knees bilaterally  Skin:    General: Skin is warm and dry.     Capillary Refill: Capillary refill  takes 2 to 3 seconds.     Comments: Chronic venous stasis skin changes to bilateral lower extremity  Neurological:     General: No focal deficit present.     Mental Status: He is alert and oriented to person, place, and time.     ED Results / Procedures / Treatments   Labs (all labs ordered are listed, but only abnormal results are displayed) Labs Reviewed  COMPREHENSIVE METABOLIC PANEL - Abnormal; Notable for the following components:      Result Value   Chloride 92 (*)    CO2 39 (*)    Glucose, Bld 179 (*)    BUN 29 (*)    Creatinine, Ser 1.67 (*)    Calcium 8.8 (*)    ALT 65 (*)    GFR, Estimated 47 (*)    All other components within normal limits  BRAIN NATRIURETIC PEPTIDE - Abnormal; Notable for the following components:   B Natriuretic Peptide 329.7 (*)    All other components within normal limits  CBC WITH DIFFERENTIAL/PLATELET - Abnormal; Notable for the following components:   RBC 6.15 (*)    Hemoglobin 18.1 (*)    HCT 60.1 (*)    RDW 19.4 (*)    nRBC 0.5 (*)    Monocytes Absolute 1.2 (*)    All other components within normal limits  TROPONIN I (HIGH SENSITIVITY) - Abnormal; Notable for the following components:   Troponin I (High Sensitivity) 69 (*)    All other components within normal limits  TROPONIN I (HIGH SENSITIVITY) - Abnormal; Notable for the following components:   Troponin I (High Sensitivity) 74 (*)    All other components within normal limits  RESP PANEL BY RT-PCR (FLU A&B, COVID) ARPGX2  HIV ANTIBODY (ROUTINE TESTING W REFLEX)  DIGOXIN LEVEL  MAGNESIUM  COMPREHENSIVE METABOLIC PANEL  CBC    EKG None  Radiology DG Chest 2 View  Result Date: 10/16/2020 CLINICAL DATA:  Shortness of breath with lower extremity edema. EXAM: CHEST - 2 VIEW COMPARISON:  Radiograph yesterday. FINDINGS: Left-sided pacemaker in place. Cardiomegaly is again seen, not significantly changed from yesterday allowing for differences in technique. Worsening interstitial  opacities suspicious for pulmonary edema. No significant pleural effusion. Left basilar pulmonary nodule is not well seen on the current exam, obscured by overlapping soft tissue structures. No pneumothorax. Stable osseous structures. IMPRESSION: 1. Worsening interstitial opacities since yesterday suspicious for pulmonary edema. Cardiomegaly not significantly changed. 2. Left lower lobe pulmonary nodule is not well seen on the current exam. Electronically Signed   By: Narda Rutherford M.D.   On: 10/16/2020 19:02    Procedures Procedures   Medications Ordered in ED Medications  furosemide (LASIX) injection 80 mg (has no administration in time range)  aspirin chewable tablet 81 mg (has no administration in time range)  atorvastatin (LIPITOR) tablet 40 mg (has no administration in time range)  digoxin (LANOXIN) tablet 0.125 mg (has no administration in time range)  spironolactone (ALDACTONE) tablet 25 mg (has no administration in time range)  isosorbide dinitrate (ISORDIL) tablet 40 mg (has no administration in time range)  DULoxetine (CYMBALTA) DR capsule 60 mg (has no administration in time range)  bisacodyl (DULCOLAX) EC tablet 5 mg (has no administration in time range)  pantoprazole (PROTONIX) EC tablet 40  mg (has no administration in time range)  enoxaparin (LOVENOX) injection 40 mg (has no administration in time range)  sodium chloride flush (NS) 0.9 % injection 3 mL (has no administration in time range)  acetaminophen (TYLENOL) tablet 650 mg (has no administration in time range)    Or  acetaminophen (TYLENOL) suppository 650 mg (has no administration in time range)  polyethylene glycol (MIRALAX / GLYCOLAX) packet 17 g (has no administration in time range)  fluticasone furoate-vilanterol (BREO ELLIPTA) 100-25 MCG/INH 1 puff (has no administration in time range)  albuterol (VENTOLIN HFA) 108 (90 Base) MCG/ACT inhaler 1-2 puff (has no administration in time range)  sacubitril-valsartan  (ENTRESTO) 97-103 mg per tablet (has no administration in time range)  insulin aspart (novoLOG) injection 0-15 Units (has no administration in time range)  furosemide (LASIX) injection 80 mg (80 mg Intravenous Given 10/16/20 1928)    ED Course  I have reviewed the triage vital signs and the nursing notes.  Pertinent labs & imaging results that were available during my care of the patient were reviewed by me and considered in my medical decision making (see chart for details).  60 year old here for fluid overload.  He is afebrile, nonseptic, chronically ill-appearing.  Unfortunately patient has history of CHF with baseline EF at 20.  Progressive fluid overload over the last few weeks.  Have increased home diuretics without significant diuresis.  Patient called cardiology, [facility] today who recommended inpatient admission for IV diuresis.  His heart is clear.  He does have some mild crackles at his bases, he is on 3 L via nasal cannula.  Shortness of breath improved pulmonal oxygen.  Per wife in room patient does not wear oxygen regularly however only wears it as needed.  On arrival patient was hypoxic to 83% on room air.  Subsequently placed on 2 L>>> increased to 4 L [place].  He denies any recent COVID exposures.  He does appear fluid overloaded.  Patient up 20 pounds from dry weight at PCP yesterday.  No labs, imaging and reassess.  Patient likely need admission for IV diuresis  Labs and imaging personally reviewed and interpreted:  CBC without leukocytosis, hemoglobin 18.1 Metabolic panel glucose 179, creatinine 1.67 at baseline, ALT 65 Troponin 69, has chronic elevation, patient denies any chest pain BNP 329.7 X-ray with pulmonary edema No STEMI  Patient reassessed. Discussed labs and imaging.  Had good diuresis with IV Lasix here.  We will plan for admission for acute hypoxic respiratory failure due to likely CHF exacerbation.  Low suspicion for acute ACS, dissection, bacterial   Infection  CONSULT with Dr. Alinda MoneyMelvin with [facility] who agrees to evaluate patient for admission.  The patient appears reasonably stabilized for admission considering the current resources, flow, and capabilities available in the ED at this time, and [facility] screening and/or treatment in the ED prior to admission.  Patient seen and evaluated by attending, Dr. Clarene DukeLittle agrees with above treatment, plan and disposition.    MDM Rules/Calculators/A&P                           Final Clinical Impression(s) / ED Diagnoses Final diagnoses:  Acute on chronic congestive heart failure, unspecified heart failure type (HCC)  Elevated troponin  Acute respiratory failure with hypoxia (HCC)    Rx / DC Orders ED Discharge Orders    None       Oliviah Agostini A, PA-C 10/16/20 2346  Little, Ambrose Finland, MD 10/17/20 1705

## 2020-10-16 NOTE — H&P (Signed)
History and Physical   Richard [name] ZOX:[id number] DOB: Mar 16, 1961 DOA: 10/16/2020  PCP: Ananias Pilgrim, MD   Patient coming from: Home  Chief Complaint: Edema, shortness of breath  HPI: Richard [name] is a 60 y.o. male with medical history significant of heart failure, CKD, COPD, diabetes, GERD, CAD, anxiety, gout, left lower lobe hematoma, OSA who presents with ongoing shortness of breath and edema.  Patient has had 2 to 3 weeks of increasing edema and shortness of breath.  He contacted his cardiology office on the 21st and recommended increasing his home dose of torsemide from 40 daily to 40 three times a day temporarily.  He tried this but did not get much increase in diuresis.  He reports being a total of 20 pounds up from a normal weight of 260 now to 80.  He also typically takes 2 L of oxygen as needed and has needed more frequently.  Denies fevers, chills, chest pain, abdominal pain, constipation, diarrhea, nausea, vomiting. He does mention that he is not sure that all the medications in his blister packs are correct.  ED Course: Vital signs in the ED significant for blood pressure in the 110s to 120s systolic.  Respirations intermittently in the 20s.  Lab work-up showed CMP with chloride 92, bicarb 39 which is chronic, creatinine stable 1.67, calcium 8.8, ALT 65.  CBC showed chronic erythrocytosis at 18.  Troponin 69 with repeat pending.  BNP 329, respiratory panel for flu COVID-negative.  Chest x-ray with interstitial edema and left lower lobe nodule.  Patient given dose of IV Lasix with good output in the ED.  Review of Systems: As per HPI otherwise all other systems reviewed and are negative.  Past Medical History:  Diagnosis Date  . AICD (automatic cardioverter/defibrillator) present 01/11/2017  . Anxiety   . CHF (congestive heart failure) (HCC)   . CKD (chronic kidney disease)   . COPD (chronic obstructive pulmonary disease) (HCC)    never smoker, industrial exposure  . Diabetes  mellitus with diabetic neuropathy, with long-term current use of insulin (HCC)   . GERD (gastroesophageal reflux disease)   . High cholesterol   . History of gout    "take RX qd" (11/01/2017)  . Hypertension   . Nonobstructive atherosclerosis of coronary artery   . On home oxygen therapy    "2L prn" (11/01/2017)  . 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HCC)    LLL, present for years  . Systolic HF (heart failure) [facility]     Past Surgical History:  Procedure Laterality Date  . CATARACT EXTRACTION W/ INTRAOCULAR LENS IMPLANTW/ TRABECULECTOMY Left ~ 2013   "may have done this when I had my other eye OR"  . CIRCUMCISION    . CORONARY STENT INTERVENTION N/A 11/02/2017   Procedure: CORONARY STENT INTERVENTION;  Surgeon: Laurey Morale, MD;  Location: [facility] INVASIVE CV LAB;  Service: Cardiovascular;  Laterality: N/A;  . CORONARY STENT INTERVENTION N/A 11/02/2017   Procedure: CORONARY STENT INTERVENTION;  Surgeon: Lyn Records, MD;  Location: [facility] INVASIVE CV LAB;  Service: Cardiovascular;  Laterality: N/A;  . EYE SURGERY Left ~ 2013   "fell on something in basement; stuck in my eye"  . ICD IMPLANT     Medtronic Dual Chamber 01/11/17  . MULTIPLE TOOTH EXTRACTIONS     "pulled all my top teeth"  . RIGHT/LEFT HEART CATH AND CORONARY ANGIOGRAPHY N/A 11/02/2017   Procedure: RIGHT/LEFT HEART CATH AND CORONARY ANGIOGRAPHY;  Surgeon: Laurey Morale, MD;  Location: [facility] INVASIVE CV  LAB;  Service: Cardiovascular;  Laterality: N/A;    Social History  reports that he has quit smoking. His smoking use included cigarettes. He has never used smokeless tobacco. He reports previous alcohol use. He reports that he does not use drugs.  Allergies  Allergen Reactions  . Codeine   . Coconut Oil Itching    Family History  Problem Relation Age of Onset  . Hypertension Mother   . Diabetes Mother   . Hypertension Father   . Diabetes Father   . Diabetes Sister   . Diabetes Brother   Reviewed on admission  Prior to  Admission medications   Medication Sig Start Date End Date Taking? Authorizing Provider  acetaminophen (TYLENOL) 650 MG CR tablet Take 650 mg by mouth every 8 (eight) hours as needed for pain.    [provider]  allopurinol (ZYLOPRIM) 100 MG tablet Take 1 tablet (100 mg total) by mouth daily. 12/26/17   Clegg, Amy D, NP  aspirin 81 MG chewable tablet Chew 81 mg by mouth daily.    [provider]  atorvastatin (LIPITOR) 40 MG tablet TAKE 1 TABLET(40 MG) BY MOUTH DAILY AT 6 PM 12/18/19   Laurey Morale, MD  bisacodyl (DULCOLAX) 5 MG EC tablet Take 5 mg by mouth daily as needed for moderate constipation.    [provider]  bisoprolol (ZEBETA) 5 MG tablet TAKE 1/2 TABLET BY MOUTH DAILY 10/08/20   Laurey Morale, MD  digoxin (LANOXIN) 0.125 MG tablet TAKE 1 TABLET(0.125 MG) BY MOUTH DAILY 04/28/20   Laurey Morale, MD  DULoxetine (CYMBALTA) 60 MG capsule Take 60 mg by mouth daily. 09/28/17   [provider]  eplerenone (INSPRA) 50 MG tablet Take 1 tablet (50 mg total) by mouth daily. 07/27/20   Laurey Morale, MD  fenofibrate (TRICOR) 145 MG tablet Take 1 tablet (145 mg total) by mouth daily. Patient not taking: No sig reported 01/02/20 01/01/21  Laurey Morale, MD  hydrALAZINE (APRESOLINE) 50 MG tablet Take 1.5 tablets (75 mg total) by mouth 3 (three) times daily. 09/09/20   Laurey Morale, MD  icosapent Ethyl (VASCEPA) 1 g capsule Take 2 capsules (2 g total) by mouth 2 (two) times daily. 10/08/20   Laurey Morale, MD  Insulin Pen Needle (NOVOFINE) 30G X 8 MM MISC Inject 10 each into the skin as needed. 11/06/17   Arnetha Courser, MD  insulin regular human CONCENTRATED (HUMULIN R) 500 UNIT/ML kwikpen USE AS DIRECTED PER SLIDING SCALE. MAX DAILY DOSE IS 400 UNITS. 07/18/17   [provider]  isosorbide dinitrate (ISORDIL) 20 MG tablet Take 2 tablets (40 mg total) by mouth 3 (three) times daily. 08/12/20   Robbie Lis M, PA-C  JARDIANCE 25 MG TABS tablet  Take 25 mg by mouth daily. 10/15/19   Laurey Morale, MD  MAGNESIUM-OXIDE 400 (241.3 Mg) MG tablet Take 1 tablet (400 mg total) by mouth daily. 08/27/20   Laurey Morale, MD  omeprazole (PRILOSEC) 20 MG capsule Take 20 mg by mouth daily.    [provider]  Potassium Chloride ER 20 MEQ TBCR As directed by CHF clinic. Patient not taking: No sig reported 07/27/20   Laurey Morale, MD  pregabalin (LYRICA) 100 MG capsule Take 1 capsule (100 mg total) by mouth 2 (two) times daily. 12/26/17   Clegg, Amy D, NP  sacubitril-valsartan (ENTRESTO) 97-103 MG Take 1 tablet by mouth 2 (two) times daily. 03/17/20   Laurey Morale, MD  torsemide (DEMADEX) 20 MG tablet Take 2 tablets (40 mg total) by mouth daily. 09/06/20   Laurey Morale, MD    Physical Exam: Vitals:   10/16/20 1900 10/16/20 1927 10/16/20 2000 10/16/20 2100  BP: 110/79  129/88 (!) 123/92  Pulse: 98  94 82  Resp: (!) 23  (!) 23 17  Temp:      TempSrc:      SpO2: 97%  96% 96%  Weight:  124.4 kg     Physical Exam Constitutional:      General: He is not in acute distress.    Appearance: Normal appearance. He is obese.  HENT:     Head: Normocephalic and atraumatic.     Mouth/Throat:     Mouth: Mucous membranes are moist.     Pharynx: Oropharynx is clear.  Eyes:     Extraocular Movements: Extraocular movements intact.     Pupils: Pupils are equal, round, and reactive to light.  Cardiovascular:     Rate and Rhythm: Normal rate and regular rhythm.     Pulses: Normal pulses.     Heart sounds: Normal heart sounds.  Pulmonary:     Effort: Pulmonary effort is normal. No respiratory distress.     Breath sounds: Rales present.  Abdominal:     General: Bowel sounds are normal. There is no distension.     Palpations: Abdomen is soft.     Tenderness: There is no abdominal tenderness.  Musculoskeletal:        General: No swelling or deformity.     Right lower leg: Edema present.     Left lower leg: Edema present.  Skin:     General: Skin is warm and dry.  Neurological:     General: No focal deficit present.     Mental Status: Mental status is at baseline.    Labs on Admission: I have personally reviewed following labs and imaging studies  CBC: Recent Labs  Lab 10/16/20 1809  WBC 6.3  NEUTROABS 3.2  HGB 18.1*  HCT 60.1*  MCV 97.7  PLT 270    Basic Metabolic Panel: Recent Labs  Lab 10/16/20 1809  NA 140  K 4.1  CL 92*  CO2 39*  GLUCOSE 179*  BUN 29*  CREATININE 1.67*  CALCIUM 8.8*    GFR: CrCl cannot be calculated (Unknown ideal weight.).  Liver Function Tests: Recent Labs  Lab 10/16/20 1809  AST 32  ALT 65*  ALKPHOS 80  BILITOT 1.1  PROT 7.4  ALBUMIN 3.5    Urine analysis:    Component Value Date/Time   COLORURINE AMBER (A) 04/22/2019 1359   APPEARANCEUR CLEAR 04/22/2019 1359   LABSPEC >1.030 (H) 04/22/2019 1359   PHURINE 5.5 04/22/2019 1359   GLUCOSEU 100 (A) 04/22/2019 1359   HGBUR NEGATIVE 04/22/2019 1359   BILIRUBINUR SMALL (A) 04/22/2019 1359   KETONESUR NEGATIVE 04/22/2019 1359   PROTEINUR 100 (A) 04/22/2019 1359   NITRITE NEGATIVE 04/22/2019 1359   LEUKOCYTESUR NEGATIVE 04/22/2019 1359    Radiological Exams on Admission: DG Chest 2 View  Result Date: 10/16/2020 CLINICAL DATA:  Shortness of breath with lower extremity edema. EXAM: CHEST - 2 VIEW COMPARISON:  Radiograph yesterday. FINDINGS: Left-sided pacemaker in place. Cardiomegaly is again seen, not significantly changed from yesterday allowing for differences in technique. Worsening interstitial opacities suspicious for pulmonary edema. No significant pleural effusion. Left basilar pulmonary nodule is not well seen on the current exam, obscured by overlapping soft tissue structures. No pneumothorax. Stable osseous  structures. IMPRESSION: 1. Worsening interstitial opacities since yesterday suspicious for pulmonary edema. Cardiomegaly not significantly changed. 2. Left lower lobe pulmonary nodule is not well seen  on the current exam. Electronically Signed   By: Narda Rutherford M.D.   On: 10/16/2020 19:02   EKG: Independently reviewed.  Sinus rhythm at 97 bpm.  Nonspecific intraventricular conduction delay.  Nonspecific T wave changes in lateral leads.  Similar to previous.  Assessment/Plan Principal Problem:   Acute on chronic systolic CHF (congestive heart failure) (HCC) Active Problems:   COPD (chronic obstructive pulmonary disease) (HCC)   Hypertension   CKD (chronic kidney disease)   GERD (gastroesophageal reflux disease)   Diabetes mellitus with diabetic neuropathy, with long-term current use of insulin (HCC)  Acute on chronic systolic heart failure > Patient with several weeks of worsening edema and shortness of breath.  Did not respond to increase in diuretic outpatient. > Weight up from 2 60-2 80.  BNP 329.  > Last echo in January showed EF 20-25% with mildly reduced right ventricular systolic function. > Has AICD in place. - Monitor on telemetry - Continue with IV Lasix 80 mg twice daily - Check dig level and mag - Strict I's/O, daily weights - Trend renal function and electrolytes - Continue home Isordil and Entresto - Replace home eplerenone with formulary spironolactone - Hold hydralazine given softer blood pressures - Continue home digoxin  CAD Demand ischemia > History of CAD status post NSTEMI. > Troponin mildly elevated 69 in ED with repeat pending.  Suspect demand ischemia in the setting of acute heart failure exacerbation. - Trend troponin - Continue home aspirin, Entresto  Hypertension > Softer blood pressures in the ED. - Continue with home Isordil, spironolactone, Entresto  - Holding home hydralazine  OSA > Tolerate CPAP, on nightly oxygen - Continue oxygen therapy  Diabetes - SSI  CKD 3a > Creatinine stable at 1.67 - Avoid nephrotoxic agents - Trend renal function electrolytes  Anxiety/depression - Continue home duloxetine  COPD - Continue home  Breo and as needed albuterol  DVT prophylaxis: Lovenox  Code Status:   Full  Family Communication:  Wife updated by phone Disposition Plan:   Patient is from:  Home  Anticipated DC to:  Home  Anticipated DC date:  1 to 3 days  Anticipated DC barriers: None  Consults called:  None  Admission status:  Observation, telemetry   Severity of Illness: The appropriate patient status for this patient is OBSERVATION. Observation status is judged to be reasonable and necessary in order to provide the required intensity of service to ensure the patient's safety. The patient's presenting symptoms, physical exam findings, and initial radiographic and laboratory data in the context of their medical condition is felt to place them at decreased risk for further clinical deterioration. Furthermore, it is anticipated that the patient will be medically stable for discharge from the hospital within 2 midnights of admission. The following factors support the patient status of observation.   " The patient's presenting symptoms include shortness of breath, edema. " The physical exam findings include  edema, rales. " The initial radiographic and laboratory data are creatinine 1.67, hemoglobin 18, troponin 69, BNP 329, respiratory panel flu COVID-negative.  Chest x-ray with increased interstitial edema.Synetta Fail MD [facility]  How to contact the [facility] Attending or Consulting provider 7A - 7P or covering provider during after hours 7P -7A, for this patient?   1. Check the care team in [facility] and look  for a) attending/consulting [facility] provider listed and b) the [facility] team listed 2. Log into [email or web address] and use [place]'s universal password to access. If you do not have the password, please contact the hospital operator. 3. Locate the [facility] provider you are looking for under [facility] and page to a number that you can be directly reached. 4. If you still have difficulty reaching the provider,  please page the [facility] (Director on Call) for the Hospitalists listed on amion for assistance.  10/16/2020, 10:08 PM

## 2020-10-16 NOTE — ED Notes (Signed)
Pt is in xray

## 2020-10-16 NOTE — Telephone Encounter (Signed)
   [name] wife called the Answering Service with concerns of patient having a lot of "fluid" and discomfort. Patient is up about 20lbs from his baseline. Baseline is around 260-264 lbs and patient reportedly weighed 280lbs at PCP's office yesterday. Per phone note on 10/14/2020, patient was advised to increase Torsemide to 40mg  twice daily for 3 days but has not had an significant increased urinary output with this. He is shortness of breath, hurts all over, and has a lot of welling in his legs. Recommended patient come to the ED - sounds like he needs to be admitted for IV diuresis. Patient and wife in agreement with this.  , PA-C 10/16/2020 5:32 PM

## 2020-10-16 NOTE — ED Triage Notes (Signed)
Emergency Medicine Provider Triage Evaluation Note  Richard [name] , a 60 y.o. male  was evaluated in triage.  Pt complains of swelling "all over" for the last couple weeks.  Accompanied by shortness of breath.  Review of Systems  Positive: Swelling and shortness of breath Negative: Fever, cough, chest pain, abdominal pain, syncope, dizziness  Physical Exam  BP 105/84 (BP Location: Right Arm)   Pulse 92   Temp 100 F (37.8 C) (Oral)   Resp (!) 22   SpO2 93%  Gen:   Awake, no distress   HEENT:  Atraumatic  Resp:  Normal effort  Cardiac:  Normal rate  Abd:   Nondistended, nontender  MSK:   Moves extremities without difficulty, bilateral lower extremity edema Neuro:  Speech clear   Medical Decision Making  Medically screening exam initiated at 6:05 PM.  Appropriate orders placed.  Richard [name] was informed that the remainder of the evaluation will be completed by another provider, this initial triage assessment does not replace that evaluation, and the importance of remaining in the ED until their evaluation is complete.  Clinical Impression   Patient noted to have lower extremity edema.  Hypoxia down to 85% requiring addition of supplemental O2.  No acute distress noted.  Priority acute care room.   Richard Pancoast, PA-C 10/16/20 1820

## 2020-10-17 ENCOUNTER — Encounter (HOSPITAL_COMMUNITY): Payer: Self-pay | Admitting: Internal Medicine

## 2020-10-17 DIAGNOSIS — J9611 Chronic respiratory failure with hypoxia: Secondary | ICD-10-CM | POA: Diagnosis present

## 2020-10-17 DIAGNOSIS — F32A Depression, unspecified: Secondary | ICD-10-CM | POA: Diagnosis present

## 2020-10-17 DIAGNOSIS — I2510 Atherosclerotic heart disease of native coronary artery without angina pectoris: Secondary | ICD-10-CM | POA: Diagnosis not present

## 2020-10-17 DIAGNOSIS — R7989 Other specified abnormal findings of blood chemistry: Secondary | ICD-10-CM | POA: Diagnosis not present

## 2020-10-17 DIAGNOSIS — I10 Essential (primary) hypertension: Secondary | ICD-10-CM | POA: Diagnosis not present

## 2020-10-17 DIAGNOSIS — N179 Acute kidney failure, unspecified: Secondary | ICD-10-CM

## 2020-10-17 DIAGNOSIS — Z20822 Contact with and (suspected) exposure to covid-19: Secondary | ICD-10-CM | POA: Diagnosis present

## 2020-10-17 DIAGNOSIS — I5023 Acute on chronic systolic (congestive) heart failure: Secondary | ICD-10-CM | POA: Diagnosis not present

## 2020-10-17 DIAGNOSIS — E1140 Type 2 diabetes mellitus with diabetic neuropathy, unspecified: Secondary | ICD-10-CM | POA: Diagnosis present

## 2020-10-17 DIAGNOSIS — F419 Anxiety disorder, unspecified: Secondary | ICD-10-CM | POA: Diagnosis present

## 2020-10-17 DIAGNOSIS — I5043 Acute on chronic combined systolic (congestive) and diastolic (congestive) heart failure: Secondary | ICD-10-CM | POA: Diagnosis present

## 2020-10-17 DIAGNOSIS — R778 Other specified abnormalities of plasma proteins: Secondary | ICD-10-CM | POA: Diagnosis present

## 2020-10-17 DIAGNOSIS — I428 Other cardiomyopathies: Secondary | ICD-10-CM | POA: Diagnosis present

## 2020-10-17 DIAGNOSIS — I9589 Other hypotension: Secondary | ICD-10-CM

## 2020-10-17 DIAGNOSIS — I248 Other forms of acute ischemic heart disease: Secondary | ICD-10-CM | POA: Diagnosis present

## 2020-10-17 DIAGNOSIS — I509 Heart failure, unspecified: Secondary | ICD-10-CM

## 2020-10-17 DIAGNOSIS — G8929 Other chronic pain: Secondary | ICD-10-CM | POA: Diagnosis present

## 2020-10-17 DIAGNOSIS — Z95810 Presence of automatic (implantable) cardiac defibrillator: Secondary | ICD-10-CM | POA: Diagnosis not present

## 2020-10-17 DIAGNOSIS — K219 Gastro-esophageal reflux disease without esophagitis: Secondary | ICD-10-CM | POA: Diagnosis present

## 2020-10-17 DIAGNOSIS — N1831 Chronic kidney disease, stage 3a: Secondary | ICD-10-CM | POA: Diagnosis not present

## 2020-10-17 DIAGNOSIS — I130 Hypertensive heart and chronic kidney disease with heart failure and stage 1 through stage 4 chronic kidney disease, or unspecified chronic kidney disease: Secondary | ICD-10-CM | POA: Diagnosis present

## 2020-10-17 DIAGNOSIS — M25561 Pain in right knee: Secondary | ICD-10-CM | POA: Diagnosis present

## 2020-10-17 DIAGNOSIS — I959 Hypotension, unspecified: Secondary | ICD-10-CM | POA: Diagnosis not present

## 2020-10-17 DIAGNOSIS — E7800 Pure hypercholesterolemia, unspecified: Secondary | ICD-10-CM | POA: Diagnosis present

## 2020-10-17 DIAGNOSIS — M25562 Pain in left knee: Secondary | ICD-10-CM | POA: Diagnosis present

## 2020-10-17 DIAGNOSIS — I2583 Coronary atherosclerosis due to lipid rich plaque: Secondary | ICD-10-CM

## 2020-10-17 DIAGNOSIS — E1165 Type 2 diabetes mellitus with hyperglycemia: Secondary | ICD-10-CM | POA: Diagnosis present

## 2020-10-17 DIAGNOSIS — J449 Chronic obstructive pulmonary disease, unspecified: Secondary | ICD-10-CM | POA: Diagnosis present

## 2020-10-17 DIAGNOSIS — M109 Gout, unspecified: Secondary | ICD-10-CM | POA: Diagnosis present

## 2020-10-17 DIAGNOSIS — Z9981 Dependence on supplemental oxygen: Secondary | ICD-10-CM | POA: Diagnosis not present

## 2020-10-17 DIAGNOSIS — E1122 Type 2 diabetes mellitus with diabetic chronic kidney disease: Secondary | ICD-10-CM | POA: Diagnosis present

## 2020-10-17 LAB — GLUCOSE, CAPILLARY
Glucose-Capillary: 184 mg/dL — ABNORMAL HIGH (ref 70–99)
Glucose-Capillary: 255 mg/dL — ABNORMAL HIGH (ref 70–99)
Glucose-Capillary: 220 mg/dL — ABNORMAL HIGH (ref 70–99)
Glucose-Capillary: 219 mg/dL — ABNORMAL HIGH (ref 70–99)

## 2020-10-17 LAB — CBC
HCT: 59.6 % — ABNORMAL HIGH (ref 39.0–52.0)
Hemoglobin: 17.9 g/dL — ABNORMAL HIGH (ref 13.0–17.0)
MCH: 29.3 pg (ref 26.0–34.0)
MCHC: 30 g/dL (ref 30.0–36.0)
MCV: 97.7 fL (ref 80.0–100.0)
Platelets: 229 10*3/uL (ref 150–400)
RBC: 6.1 MIL/uL — ABNORMAL HIGH (ref 4.22–5.81)
RDW: 19 % — ABNORMAL HIGH (ref 11.5–15.5)
WBC: 5.7 10*3/uL (ref 4.0–10.5)
nRBC: 0.4 % — ABNORMAL HIGH (ref 0.0–0.2)

## 2020-10-17 LAB — COMPREHENSIVE METABOLIC PANEL
ALT: 51 U/L — ABNORMAL HIGH (ref 0–44)
AST: 33 U/L (ref 15–41)
Albumin: 3.2 g/dL — ABNORMAL LOW (ref 3.5–5.0)
Alkaline Phosphatase: 72 U/L (ref 38–126)
Anion gap: 8 (ref 5–15)
BUN: 28 mg/dL — ABNORMAL HIGH (ref 6–20)
CO2: 40 mmol/L — ABNORMAL HIGH (ref 22–32)
Calcium: 8.6 mg/dL — ABNORMAL LOW (ref 8.9–10.3)
Chloride: 92 mmol/L — ABNORMAL LOW (ref 98–111)
Creatinine, Ser: 1.74 mg/dL — ABNORMAL HIGH (ref 0.61–1.24)
GFR, Estimated: 45 mL/min — ABNORMAL LOW (ref >60)
Glucose, Bld: 113 mg/dL — ABNORMAL HIGH (ref 70–99)
Potassium: 4.6 mmol/L (ref 3.5–5.1)
Sodium: 140 mmol/L (ref 135–145)
Total Bilirubin: 1.2 mg/dL (ref 0.3–1.2)
Total Protein: 6.7 g/dL (ref 6.5–8.1)

## 2020-10-17 LAB — DIGOXIN LEVEL: Digoxin Level: <0.2 ng/mL — ABNORMAL LOW (ref 0.8–2.0)

## 2020-10-17 LAB — HIV ANTIBODY (ROUTINE TESTING W REFLEX): HIV Screen 4th Generation wRfx: NONREACTIVE

## 2020-10-17 LAB — MAGNESIUM: Magnesium: 2.2 mg/dL (ref 1.7–2.4)

## 2020-10-17 MED ORDER — EMPAGLIFLOZIN 25 MG PO TABS
25.0000 mg | ORAL_TABLET | Freq: Every day | ORAL | Status: DC
  Administered 2020-10-17 – 2020-10-21 (×5): 25 mg via ORAL
  Filled 2020-10-17 (×5): qty 1

## 2020-10-17 MED ORDER — PREGABALIN 75 MG PO CAPS: 150.0000 mg | ORAL_CAPSULE | Freq: Three times a day (TID) | ORAL | Status: DC

## 2020-10-17 MED ORDER — PREGABALIN 75 MG PO CAPS
150.0000 mg | ORAL_CAPSULE | Freq: Three times a day (TID) | ORAL | Status: DC
  Administered 2020-10-17 – 2020-10-21 (×13): 150 mg via ORAL
  Filled 2020-10-17 (×13): qty 2

## 2020-10-17 MED ORDER — ALBUTEROL SULFATE HFA 108 (90 BASE) MCG/ACT IN AERS: 1.0000 | INHALATION_SPRAY | RESPIRATORY_TRACT | 1 refills | Status: DC | PRN

## 2020-10-17 MED ORDER — TORSEMIDE 40 MG PO TABS: 40.0000 mg | ORAL_TABLET | Freq: Two times a day (BID) | ORAL | 1 refills | Status: DC

## 2020-10-17 NOTE — Progress Notes (Addendum)
PROGRESS NOTE    Richard [name]  JAS:[id number]  DOB: [birth year]  PCP: Ananias Pilgrim, MD Admit date: 10/16/2020 Hospital Course Summary:59 y.o.malewith medical history significant of systolic CHF with EF 20 to 25% s/p AICD, OSA, COPD on home O2 2 L, CKD, diabetes, GERD, CAD, anxiety, gout who presents with ongoing shortness of breath and increasing edema x 2-3 weeks, progressively worsening with weight gain  in spite of increasing torsemide 40 mg daily to 3 times daily dosing per his cardiologist/heart failure team recommendation.  Patient was advised by HF team to come to ED given complaints of persistent dyspnea. ED Course: Afebrile, BMP with creatinine 1.67 (baseline 1.5-1.8), bicarb 39 (chronic), chloride 92 calcium 8.8, ALT 65. CBC showed chronic erythrocytosis at 18. Troponin 69 with repeat pending. BNP 329, respiratory panel for flu COVID-negative. Chest x-ray with interstitial edema and left lower lobe nodule. Patient given dose of IV Lasix with good output in the ED. Hospital course: Patient admitted to [facility] with IV Lasix 80 mg twice daily, hydralazine held for soft blood pressure, Entresto/Isordil continued.  Weight up from 260-280 pounds.  Troponins trended-suspect demand ischemia  Subjective:  Patient sitting in bedside chair when seen in rounds.  He is somnolent and falling asleep during interview.  States did not get enough rest overnight.  Did not get any sedatives per chart review except for scheduled Lyrica 150 mg.  Feels breathing is much improved although still has leg swellings (not sure how far off from baseline). ?  Weight up from 274.2 yesterday->278 pounds today Saturating 90 to 94% on 2 L nasal cannula.   Objective: Vitals:   10/17/20 0812 10/17/20 1134 10/17/20 1501 10/17/20 1612  BP: (!) 121/97 90/67 (!) 96/56 107/78  Pulse: 76 68    Resp: 19 20    Temp: 98.5 F (36.9 C)     TempSrc: Oral     SpO2: 90% 91% 98%   Weight:      Height:         Intake/Output Summary (Last 24 hours) at 10/17/2020 1615 Last data filed at 10/17/2020 1500 Gross per 24 hour  Intake 300 ml  Output 3000 ml  Net -2700 ml   Filed Weights   10/16/20 1927 10/17/20 0010  Weight: 124.4 kg 126.1 kg    Physical Examination: General: Chronically ill-appearing male sitting in bedside chair, somnolent, no acute distress noted Head ENT: Atraumatic normocephalic, PERRLA, neck supple Heart: S1-S2 heard, regular rate and rhythm, no murmurs.  No leg edema noted Lungs: Equal air entry bilaterally, no rhonchi or rales on exam, no accessory muscle use Abdomen: Bowel sounds heard, soft, nontender, nondistended. No organomegaly.  No CVA tenderness Extremities: 2+ pitting leg edema with chronic stasis dermatitis. Neurological: Somnolent, easily arousable and oriented x3, no focal weakness or numbness, strength and sensations to crude touch intact Skin: Chronic stasis dermatitis in bilateral lower extremities.     Data Reviewed: I have personally reviewed following labs and imaging studies  CBC: Recent Labs  Lab 10/16/20 1809 10/17/20 0017  WBC 6.3 5.7  NEUTROABS 3.2  --   HGB 18.1* 17.9*  HCT 60.1* 59.6*  MCV 97.7 97.7  PLT 270 229   Basic Metabolic Panel: Recent Labs  Lab 10/16/20 1809 10/17/20 0017  NA 140 140  K 4.1 4.6  CL 92* 92*  CO2 39* 40*  GLUCOSE 179* 113*  BUN 29* 28*  CREATININE 1.67* 1.74*  CALCIUM 8.8* 8.6*  MG  --  2.2   GFR: Estimated  Creatinine Clearance: 68 mL/min (A) (by C-G formula based on SCr of 1.74 mg/dL (H)). Liver Function Tests: Recent Labs  Lab 10/16/20 1809 10/17/20 0017  AST 32 33  ALT 65* 51*  ALKPHOS 80 72  BILITOT 1.1 1.2  PROT 7.4 6.7  ALBUMIN 3.5 3.2*   No results for input(s): LIPASE, AMYLASE in the last 168 hours. No results for input(s): AMMONIA in the last 168 hours. Coagulation Profile: No results for input(s): INR, PROTIME in the last 168 hours. Cardiac Enzymes: No results for  input(s): CKTOTAL, CKMB, CKMBINDEX, TROPONINI in the last 168 hours. BNP (last 3 results) No results for input(s): PROBNP in the last 8760 hours. HbA1C: No results for input(s): HGBA1C in the last 72 hours. CBG: Recent Labs  Lab 10/17/20 0540 10/17/20 1134  GLUCAP 184* 255*   Lipid Profile: No results for input(s): CHOL, HDL, LDLCALC, TRIG, CHOLHDL, LDLDIRECT in the last 72 hours. Thyroid Function Tests: No results for input(s): TSH, T4TOTAL, FREET4, T3FREE, THYROIDAB in the last 72 hours. Anemia Panel: No results for input(s): VITAMINB12, FOLATE, FERRITIN, TIBC, IRON, RETICCTPCT in the last 72 hours. Sepsis Labs: No results for input(s): PROCALCITON, LATICACIDVEN in the last 168 hours.  Recent Results (from the past 240 hour(s))  Resp Panel by RT-PCR (Flu A&B, Covid) Nasopharyngeal Swab     Status: None   Collection Time: 10/16/20  7:38 PM   Specimen: Nasopharyngeal Swab; Nasopharyngeal(NP) swabs in vial transport medium  Result Value Ref Range Status   SARS Coronavirus 2 by RT PCR NEGATIVE NEGATIVE Final    Comment: (NOTE) SARS-CoV-2 target nucleic acids are NOT DETECTED.  The SARS-CoV-2 RNA is generally detectable in upper respiratory specimens during the acute phase of infection. The lowest concentration of SARS-CoV-2 viral copies this assay can detect is 138 copies/mL. A negative result does not preclude SARS-Cov-2 infection and should not be used as the sole basis for treatment or other patient management decisions. A negative result may occur with  improper specimen collection/handling, submission of specimen other than nasopharyngeal swab, presence of viral mutation(s) within the areas targeted by this assay, and inadequate number of viral copies(<138 copies/mL). A negative result must be combined with clinical observations, patient history, and epidemiological information. The expected result is Negative.  Fact Sheet for Patients:   BloggerCourse.com  Fact Sheet for Healthcare Providers:  SeriousBroker.it  This test is no t yet approved or cleared by the [place] FDA and  has been authorized for detection and/or diagnosis of SARS-CoV-2 by FDA under an Emergency Use Authorization (EUA). This EUA will remain  in effect (meaning this test can be used) for the duration of the COVID-19 declaration under Section 564(b)(1) of the Act, 21 U.S.C.section 360bbb-3(b)(1), unless the authorization is terminated  or revoked sooner.       Influenza A by PCR NEGATIVE NEGATIVE Final   Influenza B by PCR NEGATIVE NEGATIVE Final    Comment: (NOTE) The Xpert Xpress SARS-CoV-2/FLU/RSV plus assay is intended as an aid in the diagnosis of influenza from Nasopharyngeal swab specimens and should not be used as a sole basis for treatment. Nasal washings and aspirates are unacceptable for Xpert Xpress SARS-CoV-2/FLU/RSV testing.  Fact Sheet for Patients: BloggerCourse.com  Fact Sheet for Healthcare Providers: SeriousBroker.it  This test is not yet approved or cleared by the [place] FDA and has been authorized for detection and/or diagnosis of SARS-CoV-2 by FDA under an Emergency Use Authorization (EUA). This EUA will remain in effect (meaning this test can be  used) for the duration of the COVID-19 declaration under Section 564(b)(1) of the Act, 21 U.S.C. section 360bbb-3(b)(1), unless the authorization is terminated or revoked.  Performed at [facility], [address] [place], [place] [address]       Radiology Studies: DG Chest 2 View  Result Date: 10/16/2020 CLINICAL DATA:  Shortness of breath with lower extremity edema. EXAM: CHEST - 2 VIEW COMPARISON:  Radiograph yesterday. FINDINGS: Left-sided pacemaker in place. Cardiomegaly is again seen, not significantly changed from yesterday allowing for  differences in technique. Worsening interstitial opacities suspicious for pulmonary edema. No significant pleural effusion. Left basilar pulmonary nodule is not well seen on the current exam, obscured by overlapping soft tissue structures. No pneumothorax. Stable osseous structures. IMPRESSION: 1. Worsening interstitial opacities since yesterday suspicious for pulmonary edema. Cardiomegaly not significantly changed. 2. Left lower lobe pulmonary nodule is not well seen on the current exam. Electronically Signed   By: Narda Rutherford M.D.   On: 10/16/2020 19:02      Scheduled Meds: . aspirin  81 mg Oral Daily  . atorvastatin  40 mg Oral Daily  . digoxin  0.125 mg Oral Daily  . DULoxetine  60 mg Oral Daily  . enoxaparin (LOVENOX) injection  40 mg Subcutaneous Q24H  . fluticasone furoate-vilanterol  1 puff Inhalation Daily  . furosemide  80 mg Intravenous BID  . insulin aspart  0-15 Units Subcutaneous TID WC  . isosorbide dinitrate  40 mg Oral TID  . pantoprazole  40 mg Oral Daily  . pregabalin  150 mg Oral TID  . sacubitril-valsartan  1 tablet Oral BID  . sodium chloride flush  3 mL Intravenous Q12H  . spironolactone  25 mg Oral Daily   Continuous Infusions:  Assessment/Plan:  1.  Acute on chronic systolic CHF with low EF 20 to 25%, s/p AICD: Patient well-known to cardiology service and follows heart failure team-Dr. Jearld Pies as outpatient.  He was maintained on torsemide 40 mg once daily since last seen by cardiology in January 2022.  This was increased to 40 mg twice daily 3 days prior to admission when patient called with complaints of 3 pound weight gain, exertional dyspnea and worsening leg edema.  Patient had no improvement and continued to gain weight with shortness of breath prompting ED visit.  Patient diuresed well overnight with net -2.7 L with IV Lasix and able to maintain O2 sat on home O2 2 L but still appears volume overloaded and weight still up but 278 pounds-baseline appears  to be ~270 pounds as recorded during February/march cardiology notes (although in May 2019-discharge weight was recorded at 260 pounds )-wife states his good weight is around 264 pounds.  Patient this morning feels much better in terms of breathing and was able to maintain O2 sat greater than 92% on ambulation with 2 L home O2.  Requested cardiology to evaluate patient for discharge readiness versus further IV diuresis need/prolonged stay given no improvement in weight. Continue Entresto, nitrates, Aldactone, digoxin.  Not on beta-blockers due to worsening dyspnea previously.  Hydralazine held on admission for borderline blood pressures.  2.  CAD, s/p non-STEMI: Patient had elevated HS troponin at 69-74, likely related to demand ischemia and heart failure exacerbation.  Continue current management and await further cardiology recommendations.  Continue aspirin, statins.  Previously nontolerant to bisoprolol with worsening dyspnea.  3.  History of hypertension: Now borderline hypotensive with systolic blood pressures fluctuating between 90-110 in the setting of IV diuresis, baseline  low EF and multiple cardiac medications.  Resume medications as tolerated by BP.  Hydralazine remains on hold currently.  4.  Chronic hypoxic respiratory failure: Likely multifactorial with chronic CHF, obstructive sleep apnea and COPD (although denies history of smoking).  Resume home inhaler therapy.  Per previous cardiology notes patient not tolerant to CPAP at night and uses O2 2 L.  5.  CKD stage IIIa: Baseline creatinine appears to be around 1.5-1.8 with GFR in the 40s.  Currently creatinine stable overall, 1.74 today  6.  Chronic venous stasis, stasis dermatitis: May benefit from support stockings or compression wraps.  Diuretic adjustment as tolerated by blood pressure.  Avoid venodilators if possible (on nitrates currently per cardiology)  7.  Diabetes mellitus with neuropathy: Resume home  regimen -Jardiance 25 MG ,  insulin SSI and Lyrica.  Blood glucose 200-250 this morning.  8.  GERD: PPI  DVT prophylaxis: Lovenox Code Status: Full code Family / Patient Communication: Discussed with patient, updated POA, EX-wife and called cardiology Disposition Plan:   Status is: Observation  The patient will require care spanning > 2 midnights and should be moved to inpatient if cardiology feels prolonged IV diuretic therapy warranted  Dispo: The patient is from: Home              Anticipated d/c is to: Home              Patient currently is awaiting cardiology evaluation before medical clearance for discharge   Difficult to place patient No   Time spent: 40 mins    >50% time spent in discussions with care team and coordination of care.  Discussed with bedside nurse, cardiology APP    Alessandra Bevels, MD [facility] Pager in Amion  If 7PM-7AM, please contact night-coverage [email or web address] 10/17/2020, 4:15 PM

## 2020-10-17 NOTE — Plan of Care (Signed)
Pt arrived to the unit at 2310, alert and oriented X4. Denies any chest pain, palpitations, SOB, or abdominal pain. Reports that he usually takes Lyrica for Neuropathy. He reports that he can't feel anything on his lower extremities. Currently, on 2L02 sating well. No respiratory distress noted. Will monitor patient closely.

## 2020-10-17 NOTE — Consult Note (Signed)
Cardiology Consultation:   Patient ID: Richard [name] MRN: [id number]; DOB: [birth year]  Admit date: 10/16/2020 Date of Consult: 10/17/2020  Primary Care Provider: Ananias Pilgrim, MD [facility] Cardiologist: Marca Ancona, MD [facility] Electrophysiologist:  None    Patient Profile:   Richard [name] is a 60 y.o. male with a hx of chronic systolic HF s/p Medtronic ICD (12/2016), HTN, DM, and HLD.  who is being seen today for the evaluation of acute CHF at the request of Alessandra Bevels, MD.  History of Present Illness:   Richard [name] is a 60yo male with a  history of chronic systolic HF s/p Medtronic ICD (12/2016), HTN, DM, and HLD.  Admitted 5/8-5/14/19 with A/C systolic HF. Echo showed EF 15-20%. Advanced HF team was consulted. He diuresed 20 lbs with IV lasix, then transitioned to torsemide 40 mg daily. Underwent R/LHC showing a focal severe RCA stenosis treated with DES.  He did not, however, appear to have severe enough coronary disease to explain his cardiomyopathy. Cardiac index was low at 1.9.  HF meds were optimized. Beta blocker was not started due to low CI and soft BPs. He was discharged on home oxygen. Referred to cardiac rehab. DC weight: 260 lbs.   Echo in 2/20 showed EF 20-25% with severe LV dilation, moderate RV enlargement with moderately decreased RV systolic function.  CPX in 2/21 showed a severe functional limitation but it actually appeared to be primarily due to pulmonary issues.   Echo 1/22 showed EF < 20% with severe LV dilation, normal RV size with moderately decreased systolic function, dilated IVC. Seen in office 1/13 and was mildly fluid overloaded. Torsemide was increased to 40 mg daily. Hydralazine and Imdur also increased for better BP control. He was seen back in office in Feb 2022 and volume was improved and euvolemic on exam and by Optivol.  Fenofibrate was stopped at that OV due to hallucinations.    Presented to the er with 2-3 weeks of increasing LE edema  and DOE.  He called the office on 4/21 and his Torsemide was increased to 40mg  TID but did not get much response in UOP and had was 20lbs up.  He also has had to increase his Home O2 demand at home.  He denies any chest pain or pressure, dizziness, syncope or palpitations.  No PND or orthopnea.  In ER SCr stable at 1.67, hsTrop 69, BNP 329 and Cxray with interstitial edema.  He was given IV lasix 40mg  and started on 80mg  IV IBD.  Hydralazine was held for soft BP and he was kept on spiro, Entresto and Isordil.  Cardiology is now asked to help with CHF management.    Past Medical History:  Diagnosis Date  . AICD (automatic cardioverter/defibrillator) present 01/11/2017  . Anxiety   . CHF (congestive heart failure) (HCC)   . CKD (chronic kidney disease)   . COPD (chronic obstructive pulmonary disease) (HCC)    never smoker, industrial exposure  . Diabetes mellitus with diabetic neuropathy, with long-term current use of insulin (HCC)   . GERD (gastroesophageal reflux disease)   . High cholesterol   . History of gout    "take RX qd" (11/01/2017)  . Hypertension   . Nonobstructive atherosclerosis of coronary artery   . On home oxygen therapy    "2L prn" (11/01/2017)  . 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HCC)    LLL, present for years  . Systolic HF (heart failure) [facility])     Past Surgical History:  Procedure  Laterality Date  . CATARACT EXTRACTION W/ INTRAOCULAR LENS IMPLANTW/ TRABECULECTOMY Left ~ 2013   "may have done this when I had my other eye OR"  . CIRCUMCISION    . CORONARY STENT INTERVENTION N/A 11/02/2017   Procedure: CORONARY STENT INTERVENTION;  Surgeon: Laurey Morale, MD;  Location: [facility] INVASIVE CV LAB;  Service: Cardiovascular;  Laterality: N/A;  . CORONARY STENT INTERVENTION N/A 11/02/2017   Procedure: CORONARY STENT INTERVENTION;  Surgeon: Lyn Records, MD;  Location: [facility] INVASIVE CV LAB;  Service: Cardiovascular;  Laterality: N/A;  . EYE SURGERY Left ~ 2013   "fell on something in  basement; stuck in my eye"  . ICD IMPLANT     Medtronic Dual Chamber 01/11/17  . MULTIPLE TOOTH EXTRACTIONS     "pulled all my top teeth"  . RIGHT/LEFT HEART CATH AND CORONARY ANGIOGRAPHY N/A 11/02/2017   Procedure: RIGHT/LEFT HEART CATH AND CORONARY ANGIOGRAPHY;  Surgeon: Laurey Morale, MD;  Location: [facility] INVASIVE CV LAB;  Service: Cardiovascular;  Laterality: N/A;     Home Medications:  Prior to Admission medications   Medication Sig Start Date End Date Taking? Authorizing Provider  acetaminophen (TYLENOL) 650 MG CR tablet Take 650 mg by mouth every 8 (eight) hours as needed for pain.   Yes [provider]  allopurinol (ZYLOPRIM) 100 MG tablet Take 1 tablet (100 mg total) by mouth daily. 12/26/17  Yes Clegg, Amy D, NP  aspirin 325 MG EC tablet Take 325 mg by mouth daily.   Yes [provider]  aspirin 81 MG chewable tablet Chew 81 mg by mouth daily.   Yes [provider]  atorvastatin (LIPITOR) 40 MG tablet TAKE 1 TABLET(40 MG) BY MOUTH DAILY AT 6 PM 12/18/19  Yes Laurey Morale, MD  docusate sodium (COLACE) 100 MG capsule Take 100 mg by mouth daily as needed for mild constipation.   Yes [provider]  DULoxetine (CYMBALTA) 60 MG capsule Take 60 mg by mouth daily. 09/28/17  Yes [provider]  eplerenone (INSPRA) 50 MG tablet Take 1 tablet (50 mg total) by mouth daily. 07/27/20  Yes Laurey Morale, MD  hydrALAZINE (APRESOLINE) 50 MG tablet Take 1.5 tablets (75 mg total) by mouth 3 (three) times daily. 09/09/20  Yes Laurey Morale, MD  insulin regular human CONCENTRATED (HUMULIN R) 500 UNIT/ML kwikpen Inject 0-400 Units into the skin as directed. Sliding 07/18/17  Yes [provider]  isosorbide dinitrate (ISORDIL) 20 MG tablet Take 2 tablets (40 mg total) by mouth 3 (three) times daily. 08/12/20  Yes Simmons, Brittainy M, PA-C  JARDIANCE 25 MG TABS tablet Take 25 mg by mouth daily. 10/15/19  Yes Laurey Morale, MD  MAGNESIUM-OXIDE 400  (241.3 Mg) MG tablet Take 1 tablet (400 mg total) by mouth daily. 08/27/20  Yes Laurey Morale, MD  omeprazole (PRILOSEC) 20 MG capsule Take 20 mg by mouth daily.   Yes [provider]  pregabalin (LYRICA) 150 MG capsule Take 150 mg by mouth 3 (three) times daily. 10/07/20  Yes [provider]  sacubitril-valsartan (ENTRESTO) 97-103 MG Take 1 tablet by mouth 2 (two) times daily. 03/17/20  Yes Laurey Morale, MD  SSD 1 % cream Apply 1 application topically daily as needed (skin irritation). 10/15/20  Yes [provider]  albuterol (PROVENTIL) (2.5 MG/3ML) 0.083% nebulizer solution Inhale into the lungs. 07/15/19 07/14/20  [provider]  albuterol (VENTOLIN HFA) 108 (90 Base) MCG/ACT inhaler Inhale 1-2 puffs into the  lungs every 4 (four) hours as needed for wheezing or shortness of breath. 10/17/20   Alessandra Bevels, MD  bisoprolol (ZEBETA) 5 MG tablet TAKE 1/2 TABLET BY MOUTH DAILY 10/08/20   Laurey Morale, MD  digoxin (LANOXIN) 0.125 MG tablet TAKE 1 TABLET(0.125 MG) BY MOUTH DAILY 04/28/20   Laurey Morale, MD  icosapent Ethyl (VASCEPA) 1 g capsule Take 2 capsules (2 g total) by mouth 2 (two) times daily. 10/08/20   Laurey Morale, MD  Insulin Pen Needle (NOVOFINE) 30G X 8 MM MISC Inject 10 each into the skin as needed. 11/06/17   Arnetha Courser, MD  pregabalin (LYRICA) 100 MG capsule Take 1 capsule (100 mg total) by mouth 2 (two) times daily. Patient not taking: No sig reported 12/26/17   Tonye Becket D, NP  torsemide 40 MG TABS Take 40 mg by mouth 2 (two) times daily for 15 days. Take 1/2 the dose (20mg  ) if blood pressure systolic (top number) <100 10/17/20 11/01/20  01/01/21, MD    Inpatient Medications: Scheduled Meds: . aspirin  81 mg Oral Daily  . atorvastatin  40 mg Oral Daily  . digoxin  0.125 mg Oral Daily  . DULoxetine  60 mg Oral Daily  . enoxaparin (LOVENOX) injection  40 mg Subcutaneous Q24H  . fluticasone furoate-vilanterol  1 puff  Inhalation Daily  . furosemide  80 mg Intravenous BID  . insulin aspart  0-15 Units Subcutaneous TID WC  . isosorbide dinitrate  40 mg Oral TID  . pantoprazole  40 mg Oral Daily  . pregabalin  150 mg Oral TID  . sacubitril-valsartan  1 tablet Oral BID  . sodium chloride flush  3 mL Intravenous Q12H  . spironolactone  25 mg Oral Daily   Continuous Infusions:  PRN Meds: acetaminophen **OR** acetaminophen, albuterol, bisacodyl, polyethylene glycol  Allergies:    Allergies  Allergen Reactions  . Codeine   . Coconut Oil Itching    Social History:   Social History   Socioeconomic History  . Marital status: Divorced    Spouse name: Not on file  . Number of children: Not on file  . Years of education: Not on file  . Highest education level: Not on file  Occupational History  . Occupation: disability    Comment: stopped working in 2000 d/t occupational exposures  Tobacco Use  . Smoking status: Former Smoker    Types: Cigarettes  . Smokeless tobacco: Never Used  . Tobacco comment: "smoked when I drank"  Vaping Use  . Vaping Use: Never used  Substance and Sexual Activity  . 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 Drug use: Never  . Sexual activity: Not Currently  Other Topics Concern  . Not on file  Social History Narrative   Patient lives at home with wife and some of his 10 children. He self-administers his own medications.   Social Determinants of Health   Financial Resource Strain: Not on file  Food Insecurity: Not on file  Transportation Needs: Not on file  Physical Activity: Not on file  Stress: Not on file  Social Connections: Not on file  Intimate Partner Violence: Not on file    Family History:    Family History  Problem Relation Age of Onset  . Hypertension Mother   . Diabetes Mother   . Hypertension Father   . Diabetes Father   . Diabetes Sister   .  Diabetes Brother      ROS:  Please see  the history of present illness.   All other ROS reviewed and negative.     Physical Exam/Data:   Vitals:   10/17/20 0812 10/17/20 1134 10/17/20 1501 10/17/20 1612  BP: (!) 121/97 90/67 (!) 96/56 107/78  Pulse: 76 68    Resp: 19 20    Temp: 98.5 F (36.9 C)     TempSrc: Oral     SpO2: 90% 91% 98%   Weight:      Height:        Intake/Output Summary (Last 24 hours) at 10/17/2020 1649 Last data filed at 10/17/2020 1500 Gross per 24 hour  Intake 300 ml  Output 3000 ml  Net -2700 ml   Last 3 Weights 10/17/2020 10/16/2020 09/09/2020  Weight (lbs) 277 lb 14.4 oz 274 lb 3.2 oz 270 lb  Weight (kg) 126.055 kg 124.376 kg 122.471 kg     Body mass index is 32.95 kg/m.  General:  Well nourished, well developed, in no acute distress HEENT: normal Lymph: no adenopathy Neck: no JVD Endocrine:  No thryomegaly Vascular: No carotid bruits; FA pulses 2+ bilaterally without bruits  Cardiac:  normal S1, S2; RRR; no murmur  Lungs:  clear to auscultation bilaterally, no wheezing, rhonchi or rales  Abd: soft, nontender, no hepatomegaly  Ext: no edema Musculoskeletal:  No deformities, BUE and BLE strength normal and equal Skin: warm and dry  Neuro:  CNs 2-12 intact, no focal abnormalities noted Psych:  Normal affect   EKG:  The EKG was personally reviewed and demonstrates:  NSR with nonspecific IVCD Telemetry:  Telemetry was personally reviewed and demonstrates:  NSR  Relevant CV Studies: 2D Echo 06/2020 IMPRESSIONS   1. EF remains severely depressed compared to echo done 07/30/18.  2. Mild pulsus alternans in LVOT signal persists . Left ventricular  ejection fraction, by estimation, is 20 to 25%. The left ventricle has  severely decreased function. The left ventricle demonstrates global  hypokinesis. The left ventricular internal  cavity size was severely dilated. Left ventricular diastolic parameters  were normal.  3. Right ventricular systolic function is mildly reduced. The right   ventricular size is mildly enlarged.  4. Left atrial size was mildly dilated.  5. The mitral valve is normal in structure. Trivial mitral valve  regurgitation. No evidence of mitral stenosis.  6. The aortic valve is normal in structure. Aortic valve regurgitation is  not visualized. No aortic stenosis is present.  7. The inferior vena cava is dilated in size with <50% respiratory  variability, suggesting right atrial pressure of 15 mmHg.   Laboratory Data:  High Sensitivity Troponin:   Recent Labs  Lab 10/16/20 1809 10/16/20 2100  TROPONINIHS 69* 74*     Chemistry Recent Labs  Lab 10/16/20 1809 10/17/20 0017  NA 140 140  K 4.1 4.6  CL 92* 92*  CO2 39* 40*  GLUCOSE 179* 113*  BUN 29* 28*  CREATININE 1.67* 1.74*  CALCIUM 8.8* 8.6*  GFRNONAA 47* 45*  ANIONGAP 9 8    Recent Labs  Lab 10/16/20 1809 10/17/20 0017  PROT 7.4 6.7  ALBUMIN 3.5 3.2*  AST 32 33  ALT 65* 51*  ALKPHOS 80 72  BILITOT 1.1 1.2   Hematology Recent Labs  Lab 10/16/20 1809 10/17/20 0017  WBC 6.3 5.7  RBC 6.15* 6.10*  HGB 18.1* 17.9*  HCT 60.1* 59.6*  MCV 97.7 97.7  MCH 29.4 29.3  MCHC 30.1 30.0  RDW 19.4* 19.0*  PLT 270 229   BNP Recent Labs  Lab 10/16/20 1810  BNP 329.7*    DDimer No results for input(s): DDIMER in the last 168 hours.   Radiology/Studies:  DG Chest 2 View  Result Date: 10/16/2020 CLINICAL DATA:  Shortness of breath with lower extremity edema. EXAM: CHEST - 2 VIEW COMPARISON:  Radiograph yesterday. FINDINGS: Left-sided pacemaker in place. Cardiomegaly is again seen, not significantly changed from yesterday allowing for differences in technique. Worsening interstitial opacities suspicious for pulmonary edema. No significant pleural effusion. Left basilar pulmonary nodule is not well seen on the current exam, obscured by overlapping soft tissue structures. No pneumothorax. Stable osseous structures. IMPRESSION: 1. Worsening interstitial opacities since  yesterday suspicious for pulmonary edema. Cardiomegaly not significantly changed. 2. Left lower lobe pulmonary nodule is not well seen on the current exam. Electronically Signed   By: Narda RutherfordMelanie  Sanford M.D.   On: 10/16/2020 19:02     Assessment and Plan:   1. Acute on Chronic systolic CHF:  -Primarily nonischemic cardiomyopathy. Echo in 2017 with EF 15-20%. Medtronic ICD. Echo 10/2017 EF 15-20%, severely dilated RV with severely decreased systolic function. LHC/RHC 11/06/17 showed volume overload with 80% RCA stenosis. Cardiac index low at 1.91. The degree of coronary disease does not explain his cardiomyopathy. Echo in 2/20 showed severe LV dilation, EF 20-25%, moderately decreased RV systolic function. PYP scan in 7/21 not suggestive of transthyretin amyloidosis. Echo 1/22 showed EF <20%, moderately decreased RV systolic function. NYHA class II-III symptoms.  - now admitted with acute on chronic CHF likely related to dietary indiscretion with Na intake>>this past week has eaten pizza and Mrs[employer] fried chicken.  He says that he is compliant with his meds. - he is 20lbs up from his dry weight but mainly with increased SOB, LE edema and increased abdominal fullness - SCr up to 1.74 likely related to venous congestion and should improve with diuresis - would change to Lasix 80mg  IV BID - folllow I&O's, daily weights and renal function while diuresing - would benefit from Heart Failure nutritional consult - Continue Entresto 97/103 mg BID, spiro 25mg  daily, hydralazine75mg  tid, isordil 40 mg tid and digoxin 0.125 mg daily.  - Unable to tolerate bisoprolol due to dyspnea.  - Continue empagliflozin.   2. COPD:  -Never smoked, but apparently had significant occupational exposure.  -It appears that he won a lawsuit dealing with the occupational exposure-related COPD.  -CPX in 2/21 showed severe functional limitation, but it appeared to be due to pulmonary abnormalities rather than HF.   -Followup with pulmonary.    3. CAD:  -LHC in 5/19 with 80-90% proximal RCA stenosis treated with DES to RCA. This did not cause his cardiomyopathy but is a large RCA.  -he denies any anginal symptoms - Continue ASA 81 daily, Isordil, statin  4. Polycythemia: Likely related to chronic hypoxia.   5. OSA: Cannot tolerate CPAP, wears oxygen at night.   6. DM: He is on empagliflozin.  - followed by PCP  7. Hypertriglyceridemia:  - Off Vascepa due to blood in stool that resolved.  -reports intolerance to fenofibrate (hallicinations) - last lipid panel 11/21 showed TG down from 500 >>144 - fenofibrate stopped  - repeat FLP    New York Heart Association (NYHA) Functional Class NYHA Class III      For questions or updates, please contact [facility] Please consult [email or web address] for contact info under    Signed, Armanda Magicraci Aubryn Spinola, MD  10/17/2020 4:49  PM

## 2020-10-18 ENCOUNTER — Other Ambulatory Visit (HOSPITAL_COMMUNITY): Payer: Self-pay | Admitting: Cardiology

## 2020-10-18 DIAGNOSIS — I2510 Atherosclerotic heart disease of native coronary artery without angina pectoris: Secondary | ICD-10-CM | POA: Diagnosis not present

## 2020-10-18 DIAGNOSIS — N1831 Chronic kidney disease, stage 3a: Secondary | ICD-10-CM

## 2020-10-18 DIAGNOSIS — I5023 Acute on chronic systolic (congestive) heart failure: Secondary | ICD-10-CM | POA: Diagnosis not present

## 2020-10-18 DIAGNOSIS — R7989 Other specified abnormal findings of blood chemistry: Secondary | ICD-10-CM

## 2020-10-18 LAB — CBC WITH DIFFERENTIAL/PLATELET
Abs Immature Granulocytes: 0 10*3/uL (ref 0.00–0.07)
Basophils Absolute: 0 10*3/uL (ref 0.0–0.1)
Basophils Relative: 1 %
Eosinophils Absolute: 0.1 10*3/uL (ref 0.0–0.5)
Eosinophils Relative: 1 %
HCT: 61 % — ABNORMAL HIGH (ref 39.0–52.0)
Hemoglobin: 18.2 g/dL — ABNORMAL HIGH (ref 13.0–17.0)
Immature Granulocytes: 0 %
Lymphocytes Relative: 24 %
Lymphs Abs: 1.2 10*3/uL (ref 0.7–4.0)
MCH: 29.3 pg (ref 26.0–34.0)
MCHC: 29.8 g/dL — ABNORMAL LOW (ref 30.0–36.0)
MCV: 98.2 fL (ref 80.0–100.0)
Monocytes Absolute: 0.7 10*3/uL (ref 0.1–1.0)
Monocytes Relative: 15 %
Neutro Abs: 3.1 10*3/uL (ref 1.7–7.7)
Neutrophils Relative %: 59 %
Platelets: 229 10*3/uL (ref 150–400)
RBC: 6.21 MIL/uL — ABNORMAL HIGH (ref 4.22–5.81)
RDW: 18.8 % — ABNORMAL HIGH (ref 11.5–15.5)
WBC: 5.1 10*3/uL (ref 4.0–10.5)
nRBC: 0 % (ref 0.0–0.2)

## 2020-10-18 LAB — BASIC METABOLIC PANEL
Anion gap: 9 (ref 5–15)
BUN: 33 mg/dL — ABNORMAL HIGH (ref 6–20)
CO2: 38 mmol/L — ABNORMAL HIGH (ref 22–32)
Calcium: 9.2 mg/dL (ref 8.9–10.3)
Chloride: 91 mmol/L — ABNORMAL LOW (ref 98–111)
Creatinine, Ser: 1.59 mg/dL — ABNORMAL HIGH (ref 0.61–1.24)
GFR, Estimated: 50 mL/min — ABNORMAL LOW (ref >60)
Glucose, Bld: 231 mg/dL — ABNORMAL HIGH (ref 70–99)
Potassium: 4.6 mmol/L (ref 3.5–5.1)
Sodium: 138 mmol/L (ref 135–145)

## 2020-10-18 LAB — GLUCOSE, CAPILLARY
Glucose-Capillary: 169 mg/dL — ABNORMAL HIGH (ref 70–99)
Glucose-Capillary: 197 mg/dL — ABNORMAL HIGH (ref 70–99)
Glucose-Capillary: 268 mg/dL — ABNORMAL HIGH (ref 70–99)
Glucose-Capillary: 196 mg/dL — ABNORMAL HIGH (ref 70–99)
Glucose-Capillary: 247 mg/dL — ABNORMAL HIGH (ref 70–99)

## 2020-10-18 LAB — PHOSPHORUS: Phosphorus: 3.8 mg/dL (ref 2.5–4.6)

## 2020-10-18 LAB — MAGNESIUM: Magnesium: 2.2 mg/dL (ref 1.7–2.4)

## 2020-10-18 NOTE — Progress Notes (Signed)
Heart Failure Nurse Navigator Progress Note  Will follow this admission for resources and education as needed. Pt is seen by Dr. Shirlee Latch in AHF clinic.   Ozella Rocks, RN, BSN Heart Failure Nurse Navigator [phone]

## 2020-10-18 NOTE — Progress Notes (Signed)
Inpatient Diabetes Program Recommendations  AACE/ADA: New Consensus Statement on Inpatient Glycemic Control (2015)  Target Ranges:  Prepandial:   less than 140 mg/dL      Peak postprandial:   less than 180 mg/dL (1-2 hours)      Critically ill patients:  140 - 180 mg/dL   Lab Results  Component Value Date   GLUCAP 197 (H) 10/18/2020   HGBA1C 10.4 (H) 10/31/2017    Review of Glycemic Control Results for TAVARIOUS, FREEL (MRN [id number]) as of 10/18/2020 09:33  Ref. Range 10/17/2020 05:40 10/17/2020 11:34 10/17/2020 17:28 10/17/2020 21:43 10/18/2020 05:39 10/18/2020 08:21  Glucose-Capillary Latest Ref Range: 70 - 99 mg/dL 128 (H) 786 (H) 767 (H) 219 (H) 169 (H) 197 (H)   Diabetes history: DM 2 Outpatient Diabetes medications: Jardiance 25 mg Daily, Humulin R U-500 tid SSI Current orders for Inpatient glycemic control:  Jardiance 25 mg Daily Novolog 0-15 units tid  Inpatient Diabetes Program Recommendations:    -  Consider adding Novolog 3 units tid meal coverage if eating >50% of meals.  Thanks,  Christena Deem RN, MSN, BC-ADM Inpatient Diabetes Coordinator Team Pager [phone] (8a-5p)

## 2020-10-18 NOTE — Progress Notes (Signed)
Heart Failure Stewardship Pharmacist Progress Note   PCP: Ananias Pilgrim, MD PCP-Cardiologist: No primary care provider on file.    HPI:  60 yo M with PMH of CHF s/p ICD in 2018, HTN, DM, and HLD. He presented to the ED on 10/16/20 with shortness of breath and increasing edema. He did not respond well to higher doses of torsemide PTA. Reported baseline weight ~260 lbs. His last ECHO was done on 07/08/20 and LVEF was <20% with mild-moderately reduced RV systolic function.  Current HF Medications: Furosemide 80 mg IV BID Entresto 97/103 mg BID Spironolactone 25 mg daily Jardiance 25 mg daily Isordil 40 mg TID Digoxin 0.125 mg daily  Prior to admission HF Medications: Entresto 97/103 mg BID Eplerenone 50 mg daily Jardiance 25 mg daily Hydralazine 75 mg TID Isordil 40 mg TID  Pertinent Lab Values: . Serum creatinine 1.59, BUN 33, Potassium 4.6, Sodium 138, BNP 329.7, Magnesium 2.2, Digoxin <0.2 on 4/24   Vital Signs: . Weight: 271 lbs (admission weight: 274 lbs) . Blood pressure: 100/70s  . Heart rate: 70-80s   Medication Assistance / Insurance Benefits Check: Does the patient have prescription insurance?  Yes Type of insurance plan: [place] Medicaid  Outpatient Pharmacy:  Prior to admission outpatient pharmacy: [facility] Is the patient willing to use [facility] TOC pharmacy at discharge? Yes Is the patient willing to transition their outpatient pharmacy to utilize a [facility] outpatient pharmacy?   Pending    Assessment: 1. Acute on chronic systolic CHF (EF <62%), due to NICM. LHC/RHC 11/06/17 showed volume overload with 80% RCA stenosis. Cardiac index low at 1.91. PYP scan in 7/21 not suggestive of TTR amyloidosis. NYHA class III symptoms. - Continue furosemide 80 mg IV BID - Previously did not tolerate bisoprolol - would hold off initiating BB until he's euvolemic - Continue Entresto 97/103 mg BID - Continue spironolactone 25 mg daily - he takes eplerenone 50 mg daily PTA  as he had gynecomastia side effects with spironolactone in the past - Continue Jardiance 25 mg daily - Continue Isordil 40 mg TID - BP too soft to restart hydralazine at this time - Continue digoxin 0.125 mg daily. Level checked on 4/24 and was <0.2   Plan: 1) Medication changes recommended at this time: - Continue IV diuresis  2) Patient assistance: - Patient has [place] Medicaid - all copays should be $0-3 per month - Sees [facility] as outpatient and is followed by paramedicine team  3)  Education  - To be completed prior to discharge  Sharen Hones, PharmD, BCPS Heart Failure Stewardship Pharmacist Phone [phone]

## 2020-10-18 NOTE — Consult Note (Signed)
Cardiology Consultation:   Patient ID: Richard [name] MRN: [id number]; DOB: 01/25/1961  Admit date: 10/16/2020 Date of Consult: 10/18/2020  PCP:  Ananias PilgrimAsres, Alehegn, MD   [facility]  Advanced Heart Failure Clinic:  Marca Anconaalton McLean, MD        Patient Profile:   Richard [name] is a 60 y.o. male with a hx of chronic systolic heart failure status post Medtronic ICD in 2018 with a EF less than 20% who is being seen today for the evaluation of acute systolic heart failure at the request of Dr. Jerral RalphGhimire.  History of Present Illness:   Richard [name] is a 60 year old male known to the advanced heart failure clinic with nonischemic cardiomyopathy (prior focal severe RCA stenosis treated with DES however did not have enough coronary disease to explain cardiomyopathy), cardiac index 1.9, with recurrent hospitalizations here with heart failure.  Has been seen by the  Paramedicine program.  Previously in the heart failure clinic he had complained about hallucinations which he attributed to fenofibrate, but did not tolerate Vascepa due to bloody stools.  Came in yesterday with increased lower extremity edema, increased weight gain despite increasing torsemide at home.  Felt sluggish.   Past Medical History:  Diagnosis Date  . AICD (automatic cardioverter/defibrillator) present 01/11/2017  . Anxiety   . CHF (congestive heart failure) (HCC)   . CKD (chronic kidney disease)   . COPD (chronic obstructive pulmonary disease) (HCC)    never smoker, industrial exposure  . Diabetes mellitus with diabetic neuropathy, with long-term current use of insulin (HCC)   . GERD (gastroesophageal reflux disease)   . High cholesterol   . History of gout    "take RX qd" (11/01/2017)  . Hypertension   . Nonobstructive atherosclerosis of coronary artery   . On home oxygen therapy    "2L prn" (11/01/2017)  . 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HCC)    LLL, present for years  . Systolic HF (heart failure) [employer](HCC)      Past Surgical History:  Procedure Laterality Date  . CATARACT EXTRACTION W/ INTRAOCULAR LENS IMPLANTW/ TRABECULECTOMY Left ~ 2013   "may have done this when I had my other eye OR"  . CIRCUMCISION    . CORONARY STENT INTERVENTION N/A 11/02/2017   Procedure: CORONARY STENT INTERVENTION;  Surgeon: Laurey MoraleMcLean, Dalton S, MD;  [facility];  Service: Cardiovascular;  Laterality: N/A;  . CORONARY STENT INTERVENTION N/A 11/02/2017   Procedure: CORONARY STENT INTERVENTION;  Surgeon: Lyn RecordsSmith, Henry W, MD;  Location: [facility] INVASIVE CV LAB;  Service: Cardiovascular;  Laterality: N/A;  . EYE SURGERY Left ~ 2013   "fell on something in basement; stuck in my eye"  . ICD IMPLANT     Medtronic Dual Chamber 01/11/17  . MULTIPLE TOOTH EXTRACTIONS     "pulled all my top teeth"  . RIGHT/LEFT HEART CATH AND CORONARY ANGIOGRAPHY N/A 11/02/2017   Procedure: RIGHT/LEFT HEART CATH AND CORONARY ANGIOGRAPHY;  Surgeon: Laurey MoraleMcLean, Dalton S, MD;  [facility];  Service: Cardiovascular;  Laterality: N/A;     Home Medications:  Prior to Admission medications   Medication Sig Start Date End Date Taking? Authorizing Provider  acetaminophen (TYLENOL) 650 MG CR tablet Take 650 mg by mouth every 8 (eight) hours as needed for pain.   Yes [provider]  allopurinol (ZYLOPRIM) 100 MG tablet Take 1 tablet (100 mg total) by mouth daily. 12/26/17  Yes Clegg, Amy D, NP  aspirin 325 MG EC tablet Take  325 mg by mouth daily.   Yes [provider]  aspirin 81 MG chewable tablet Chew 81 mg by mouth daily.   Yes [provider]  atorvastatin (LIPITOR) 40 MG tablet TAKE 1 TABLET(40 MG) BY MOUTH DAILY AT 6 PM 12/18/19  Yes Laurey Morale, MD  docusate sodium (COLACE) 100 MG capsule Take 100 mg by mouth daily as needed for mild constipation.   Yes [provider]  DULoxetine (CYMBALTA) 60 MG capsule Take 60 mg by mouth daily. 09/28/17  Yes [provider]  eplerenone (INSPRA)  50 MG tablet Take 1 tablet (50 mg total) by mouth daily. 07/27/20  Yes Laurey Morale, MD  hydrALAZINE (APRESOLINE) 50 MG tablet Take 1.5 tablets (75 mg total) by mouth 3 (three) times daily. 09/09/20  Yes Laurey Morale, MD  insulin regular human CONCENTRATED (HUMULIN R) 500 UNIT/ML kwikpen Inject 0-400 Units into the skin as directed. Sliding 07/18/17  Yes [provider]  isosorbide dinitrate (ISORDIL) 20 MG tablet Take 2 tablets (40 mg total) by mouth 3 (three) times daily. 08/12/20  Yes Simmons, Brittainy M, PA-C  JARDIANCE 25 MG TABS tablet Take 25 mg by mouth daily. 10/15/19  Yes Laurey Morale, MD  MAGNESIUM-OXIDE 400 (241.3 Mg) MG tablet Take 1 tablet (400 mg total) by mouth daily. 08/27/20  Yes Laurey Morale, MD  omeprazole (PRILOSEC) 20 MG capsule Take 20 mg by mouth daily.   Yes [provider]  pregabalin (LYRICA) 150 MG capsule Take 150 mg by mouth 3 (three) times daily. 10/07/20  Yes [provider]  sacubitril-valsartan (ENTRESTO) 97-103 MG Take 1 tablet by mouth 2 (two) times daily. 03/17/20  Yes Laurey Morale, MD  SSD 1 % cream Apply 1 application topically daily as needed (skin irritation). 10/15/20  Yes [provider]  albuterol (PROVENTIL) (2.5 MG/3ML) 0.083% nebulizer solution Inhale into the lungs. 07/15/19 07/14/20  [provider]  albuterol (VENTOLIN HFA) 108 (90 Base) MCG/ACT inhaler Inhale 1-2 puffs into the lungs every 4 (four) hours as needed for wheezing or shortness of breath. 10/17/20   Alessandra Bevels, MD  bisoprolol (ZEBETA) 5 MG tablet TAKE 1/2 TABLET BY MOUTH DAILY 10/08/20   Laurey Morale, MD  digoxin (LANOXIN) 0.125 MG tablet TAKE 1 TABLET(0.125 MG) BY MOUTH DAILY 04/28/20   Laurey Morale, MD  icosapent Ethyl (VASCEPA) 1 g capsule Take 2 capsules (2 g total) by mouth 2 (two) times daily. 10/08/20   Laurey Morale, MD  Insulin Pen Needle (NOVOFINE) 30G X 8 MM MISC Inject 10 each into the skin as needed. 11/06/17    Arnetha Courser, MD  pregabalin (LYRICA) 100 MG capsule Take 1 capsule (100 mg total) by mouth 2 (two) times daily. Patient not taking: No sig reported 12/26/17   Tonye Becket D, NP  torsemide 40 MG TABS Take 40 mg by mouth 2 (two) times daily for 15 days. Take 1/2 the dose (20mg  ) if blood pressure systolic (top number) <100 10/17/20 11/01/20  01/01/21, MD    Inpatient Medications: Scheduled Meds: . aspirin  81 mg Oral Daily  . atorvastatin  40 mg Oral Daily  . digoxin  0.125 mg Oral Daily  . DULoxetine  60 mg Oral Daily  . empagliflozin  25 mg Oral Daily  . enoxaparin (LOVENOX) injection  40 mg Subcutaneous Q24H  . fluticasone furoate-vilanterol  1 puff Inhalation Daily  . furosemide  80 mg Intravenous BID  . insulin aspart  0-15 Units Subcutaneous TID WC  . isosorbide dinitrate  40 mg Oral TID  . pantoprazole  40 mg Oral Daily  . pregabalin  150 mg Oral TID  . sacubitril-valsartan  1 tablet Oral BID  . sodium chloride flush  3 mL Intravenous Q12H  . spironolactone  25 mg Oral Daily   Continuous Infusions:  PRN Meds: acetaminophen **OR** acetaminophen, albuterol, bisacodyl, polyethylene glycol  Allergies:    Allergies  Allergen Reactions  . Codeine   . Coconut Oil Itching    Social History:   Social History   Socioeconomic History  . Marital status: Divorced    Spouse name: Not on file  . Number of children: Not on file  . Years of education: Not on file  . Highest education level: Not on file  Occupational History  . Occupation: disability    Comment: stopped working in 2000 d/t occupational exposures  Tobacco Use  . Smoking status: Former Smoker    Types: Cigarettes  . Smokeless tobacco: Never Used  . Tobacco comment: "smoked when I drank"  Vaping Use  . Vaping Use: Never used  Substance and Sexual Activity  . 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 Drug use: Never  . Sexual activity: Not Currently   Other Topics Concern  . Not on file  Social History Narrative   Patient lives at home with wife and some of his 10 children. He self-administers his own medications.   Social Determinants of Health   Financial Resource Strain: Not on file  Food Insecurity: Not on file  Transportation Needs: Not on file  Physical Activity: Not on file  Stress: Not on file  Social Connections: Not on file  Intimate Partner Violence: Not on file    Family History:    Family History  Problem Relation Age of Onset  . Hypertension Mother   . Diabetes Mother   . Hypertension Father   . Diabetes Father   . Diabetes Sister   . Diabetes Brother      ROS:  Please see the history of present illness.  Increased weight gain, increased fluid.  No fevers chills nausea vomiting syncope bleeding. All other ROS reviewed and negative.     Physical Exam/Data:   Vitals:   10/17/20 2146 10/18/20 0006 10/18/20 0447 10/18/20 0448  BP: 114/83 102/73 95/64   Pulse: 82 75 83   Resp: 20 19 14    Temp: 98.2 F (36.8 C) 98.2 F (36.8 C) 98 F (36.7 C)   TempSrc: Oral Oral Oral   SpO2: 91% 93% 96%   Weight:    123.1 kg  Height:        Intake/Output Summary (Last 24 hours) at 10/18/2020 1003 Last data filed at 10/18/2020 0900 Gross per 24 hour  Intake --  Output 3605 ml  Net -3605 ml   Last 3 Weights 10/18/2020 10/17/2020 10/16/2020  Weight (lbs) 271 lb 4.8 oz 277 lb 14.4 oz 274 lb 3.2 oz  Weight (kg) 123.061 kg 126.055 kg 124.376 kg     Body mass index is 32.17 kg/m.  General:  Well nourished, well developed, in no acute distress HEENT: normal Lymph: no adenopathy Neck: no JVD Endocrine:  No thryomegaly Vascular: No carotid bruits; without bruits  Cardiac:  normal S1, S2; RRR; no murmur  Lungs:  clear to auscultation bilaterally, no wheezing, rhonchi or rales  Abd: soft, nontender, no hepatomegaly  Ext:  3+ tense lower extremity edema Musculoskeletal:  No deformities, BUE and BLE strength normal  and equal Skin: warm and dry  Neuro:  CNs 2-12 intact, no focal abnormalities noted Psych:  Normal affect   EKG:  The EKG was personally reviewed and demonstrates: Sinus rhythm, right axis interventricular conduction delay  Telemetry:  Telemetry was personally reviewed and demonstrates: No adverse arrhythmias  Relevant CV Studies:  Echocardiogram 07/08/2020:  1. EF remains severely depressed compared to echo done 07/30/18.  2. Mild pulsus alternans in LVOT signal persists . Left ventricular  ejection fraction, by estimation, is 20 to 25%. The left ventricle has  severely decreased function. The left ventricle demonstrates global  hypokinesis. The left ventricular internal  cavity size was severely dilated. Left ventricular diastolic parameters  were normal.  3. Right ventricular systolic function is mildly reduced. The right  ventricular size is mildly enlarged.  4. Left atrial size was mildly dilated.  5. The mitral valve is normal in structure. Trivial mitral valve  regurgitation. No evidence of mitral stenosis.  6. The aortic valve is normal in structure. Aortic valve regurgitation is  not visualized. No aortic stenosis is present.  7. The inferior vena cava is dilated in size with <50% respiratory  variability, suggesting right atrial pressure of 15 mmHg.   Cardiopulmonary stress testing 08/07/2019:  Severe functional limitation due to obesity hypoventilation syndrome with severe restrictive lung disease on resting PFTs. There is significant O2 desaturation at peak exercise. Mild chronotropic incompetence. No evidence of significant HF limitation.   Laboratory Data:  High Sensitivity Troponin:   Recent Labs  Lab 10/16/20 1809 10/16/20 2100  TROPONINIHS 69* 74*     Chemistry Recent Labs  Lab 10/16/20 1809 10/17/20 0017  NA 140 140  K 4.1 4.6  CL 92* 92*  CO2 39* 40*  GLUCOSE 179* 113*  BUN 29* 28*  CREATININE 1.67* 1.74*  CALCIUM 8.8* 8.6*  GFRNONAA 47*  45*  ANIONGAP 9 8    Recent Labs  Lab 10/16/20 1809 10/17/20 0017  PROT 7.4 6.7  ALBUMIN 3.5 3.2*  AST 32 33  ALT 65* 51*  ALKPHOS 80 72  BILITOT 1.1 1.2   Hematology Recent Labs  Lab 10/16/20 1809 10/17/20 0017  WBC 6.3 5.7  RBC 6.15* 6.10*  HGB 18.1* 17.9*  HCT 60.1* 59.6*  MCV 97.7 97.7  MCH 29.4 29.3  MCHC 30.1 30.0  RDW 19.4* 19.0*  PLT 270 229   BNP Recent Labs  Lab 10/16/20 1810  BNP 329.7*    DDimer No results for input(s): DDIMER in the last 168 hours.   Radiology/Studies:  DG Chest 2 View  Result Date: 10/16/2020 CLINICAL DATA:  Shortness of breath with lower extremity edema. EXAM: CHEST - 2 VIEW COMPARISON:  Radiograph yesterday. FINDINGS: Left-sided pacemaker in place. Cardiomegaly is again seen, not significantly changed from yesterday allowing for differences in technique. Worsening interstitial opacities suspicious for pulmonary edema. No significant pleural effusion. Left basilar pulmonary nodule is not well seen on the current exam, obscured by overlapping soft tissue structures. No pneumothorax. Stable osseous structures. IMPRESSION: 1. Worsening interstitial opacities since yesterday suspicious for pulmonary edema. Cardiomegaly not significantly changed. 2. Left lower lobe pulmonary nodule is not well seen on the current exam. Electronically Signed   By: Narda Rutherford M.D.   On: 10/16/2020 19:02     Assessment and Plan:   Acute on chronic systolic heart failure-NYHA class III stage D - Primarily nonischemic cardiomyopathy with EF less  than 20%, Medtronic ICD in place with severely dilated RV, biventricular failure. -Tried increasing torsemide as outpatient to no success. - Currently on IV Lasix with good results,  3 L out yesterday. -Continue with Entresto high-dose, digoxin, eplerenone, his hydralazine is being held secondary to lower BP, empagliflozin.  Previously did not tolerate beta-blocker, bisoprolol -BNP 329.  Weight is 271 today, down  from 277 yesterday.  Continue with diuresis.  Coronary artery disease - Left heart catheterization 2019 with 90% proximal RCA stenosis treated with DES.  Stable.  No chest pain.  Chronic kidney disease stage IIIa - Baseline creatinine between 1.5 and 1.8.  Continue to monitor with aggressive diuresis.  Elevated troponin - Mildly elevated secondary to demand ischemia in the setting of heart failure.  Polycythemia - Has been related to chronic hypoxia  Obstructive sleep apnea - Does not tolerate CPAP, wears oxygen at night  Diabetes mellitus with heart failure - He is on empagliflozin  COPD - Apparently never smoked but did have significant occupational exposure.   For questions or updates, please contact [facility] Please consult [email or web address] for contact info under    Signed, Donato Schultz, MD  10/18/2020 10:03 AM

## 2020-10-18 NOTE — Progress Notes (Signed)
PROGRESS NOTE    Boden Stucky  EYC:[id number] DOB: Sep 09, 1960 DOA: 10/16/2020 PCP: Ananias Pilgrim, MD    Brief Narrative:  60 year old gentleman with history of severe systolic congestive heart failure with known ejection fraction 20 to 25%, status post AICD, obstructive sleep apnea not using CPAP, COPD on home oxygen 2 L, CKD stage II, diabetes, GERD, coronary artery disease, anxiety and gout who follows up at advanced heart failure clinic brought to the hospital for admission and IV diuresis by heart failure clinic due to increasing edema of the legs, progressive weight gain despite home dialysis therapy.  In the emergency room hemodynamically stable.  Renal function at about baseline.  BNP 329.  Chest x-ray with interstitial edema and left lower lobe nodule.  Admitted with IV diuresis.   Assessment & Plan:   Principal Problem:   Acute on chronic systolic CHF (congestive heart failure) (HCC) Active Problems:   COPD (chronic obstructive pulmonary disease) (HCC)   Hypertension   CKD (chronic kidney disease)   GERD (gastroesophageal reflux disease)   Diabetes mellitus with diabetic neuropathy, with long-term current use of insulin (HCC)   CHF exacerbation (HCC)  Acute on chronic systolic congestive heart failure with known ejection fraction 20 to 25%, status post AICD: Patient remains on high-dose IV Lasix with good results, 3 L urine output last 24 hours.  Remains on advanced heart failure therapy with Entresto, digoxin, eplerenone.  Hydralazine on hold.  Apparently, not tolerating beta-blockers.  Also on Jardiance.  Followed by cardiology. Daily weight.  Intake and output monitoring.  Coronary artery disease: Stable.  No chest pain.  On aspirin.  Chronic kidney disease stage IIIa: Baseline creatinine reportedly 1.5-1.8.  Remains at about baseline.  Polycythemia: Chronic hypoxia.  Obstructive sleep apnea: Does not tolerate CPAP.  Wears oxygen instead.  Type 2 diabetes with heart  failure: Fairly controlled.  On Jardiance.   DVT prophylaxis: enoxaparin (LOVENOX) injection 40 mg Start: 10/17/20 1000   Code Status: Full code Family Communication: None.  Patient talking to family. Disposition Plan: Status is: Inpatient  Remains inpatient appropriate because:Inpatient level of care appropriate due to severity of illness   Dispo: The patient is from: Home              Anticipated d/c is to: Home              Patient currently is not medically stable to d/c.   Difficult to place patient No         Consultants:   Cardiology  Procedures:   None  Antimicrobials:   None   Subjective: Patient seen and examined.  Denies shortness of breath.  His main complaint is leg swelling.  He also gets panic attacks which is probably due to heart failure.  He wants me to do something about his panic attack.  I discussed with him that getting good control of her heart failure symptoms will help him to avoid panic attacks. He is more worried about running around his grandchild and unable to play with him because of shortness of breath and panic attacks.  Objective: Vitals:   10/18/20 0006 10/18/20 0447 10/18/20 0448 10/18/20 1141  BP: 102/73 95/64  99/78  Pulse: 75 83  86  Resp: 19 14  16   Temp: 98.2 F (36.8 C) 98 F (36.7 C)  97.8 F (36.6 C)  TempSrc: Oral Oral  Oral  SpO2: 93% 96%  92%  Weight:   123.1 kg   Height:  Intake/Output Summary (Last 24 hours) at 10/18/2020 1340 Last data filed at 10/18/2020 1300 Gross per 24 hour  Intake 500 ml  Output 4005 ml  Net -3505 ml   Filed Weights   10/16/20 1927 10/17/20 0010 10/18/20 0448  Weight: 124.4 kg 126.1 kg 123.1 kg    Examination:  General exam: Appears calm and comfortable  Currently on room air at rest.  Chronically sick looking.  Mildly anxious.  Not in any distress. Respiratory system: Bilateral clear.  No added sounds. Cardiovascular system: S1 & S2 heard, RRR.  3+ bilateral pedal  edema. Gastrointestinal system: Distended.  Nontender.  Bowel sounds present. Central nervous system: Alert and oriented. No focal neurological deficits. Extremities: Symmetric 5 x 5 power. Skin: No rashes, lesions or ulcers Psychiatry: Judgement and insight appear normal. Mood & affect appropriate.     Data Reviewed: I have personally reviewed following labs and imaging studies  CBC: Recent Labs  Lab 10/16/20 1809 10/17/20 0017 10/18/20 0900  WBC 6.3 5.7 5.1  NEUTROABS 3.2  --  3.1  HGB 18.1* 17.9* 18.2*  HCT 60.1* 59.6* 61.0*  MCV 97.7 97.7 98.2  PLT 270 229 229   Basic Metabolic Panel: Recent Labs  Lab 10/16/20 1809 10/17/20 0017 10/18/20 0900  NA 140 140 138  K 4.1 4.6 4.6  CL 92* 92* 91*  CO2 39* 40* 38*  GLUCOSE 179* 113* 231*  BUN 29* 28* 33*  CREATININE 1.67* 1.74* 1.59*  CALCIUM 8.8* 8.6* 9.2  MG  --  2.2 2.2  PHOS  --   --  3.8   GFR: Estimated Creatinine Clearance: 73.6 mL/min (A) (by C-G formula based on SCr of 1.59 mg/dL (H)). Liver Function Tests: Recent Labs  Lab 10/16/20 1809 10/17/20 0017  AST 32 33  ALT 65* 51*  ALKPHOS 80 72  BILITOT 1.1 1.2  PROT 7.4 6.7  ALBUMIN 3.5 3.2*   No results for input(s): LIPASE, AMYLASE in the last 168 hours. No results for input(s): AMMONIA in the last 168 hours. Coagulation Profile: No results for input(s): INR, PROTIME in the last 168 hours. Cardiac Enzymes: No results for input(s): CKTOTAL, CKMB, CKMBINDEX, TROPONINI in the last 168 hours. BNP (last 3 results) No results for input(s): PROBNP in the last 8760 hours. HbA1C: No results for input(s): HGBA1C in the last 72 hours. CBG: Recent Labs  Lab 10/17/20 1728 10/17/20 2143 10/18/20 0539 10/18/20 0821 10/18/20 1119  GLUCAP 220* 219* 169* 197* 268*   Lipid Profile: No results for input(s): CHOL, HDL, LDLCALC, TRIG, CHOLHDL, LDLDIRECT in the last 72 hours. Thyroid Function Tests: No results for input(s): TSH, T4TOTAL, FREET4, T3FREE,  THYROIDAB in the last 72 hours. Anemia Panel: No results for input(s): VITAMINB12, FOLATE, FERRITIN, TIBC, IRON, RETICCTPCT in the last 72 hours. Sepsis Labs: No results for input(s): PROCALCITON, LATICACIDVEN in the last 168 hours.  Recent Results (from the past 240 hour(s))  Resp Panel by RT-PCR (Flu A&B, Covid) Nasopharyngeal Swab     Status: None   Collection Time: 10/16/20  7:38 PM   Specimen: Nasopharyngeal Swab; Nasopharyngeal(NP) swabs in vial transport medium  Result Value Ref Range Status   SARS Coronavirus 2 by RT PCR NEGATIVE NEGATIVE Final    Comment: (NOTE) SARS-CoV-2 target nucleic acids are NOT DETECTED.  The SARS-CoV-2 RNA is generally detectable in upper respiratory specimens during the acute phase of infection. The lowest concentration of SARS-CoV-2 viral copies this assay can detect is 138 copies/mL. A negative result does not preclude  SARS-Cov-2 infection and should not be used as the sole basis for treatment or other patient management decisions. A negative result may occur with  improper specimen collection/handling, submission of specimen other than nasopharyngeal swab, presence of viral mutation(s) within the areas targeted by this assay, and inadequate number of viral copies(<138 copies/mL). A negative result must be combined with clinical observations, patient history, and epidemiological information. The expected result is Negative.  Fact Sheet for Patients:  BloggerCourse.com  Fact Sheet for Healthcare Providers:  SeriousBroker.it  This test is no t yet approved or cleared by the [place] FDA and  has been authorized for detection and/or diagnosis of SARS-CoV-2 by FDA under an Emergency Use Authorization (EUA). This EUA will remain  in effect (meaning this test can be used) for the duration of the COVID-19 declaration under Section 564(b)(1) of the Act, 21 U.S.C.section 360bbb-3(b)(1), unless the  authorization is terminated  or revoked sooner.       Influenza A by PCR NEGATIVE NEGATIVE Final   Influenza B by PCR NEGATIVE NEGATIVE Final    Comment: (NOTE) The Xpert Xpress SARS-CoV-2/FLU/RSV plus assay is intended as an aid in the diagnosis of influenza from Nasopharyngeal swab specimens and should not be used as a sole basis for treatment. Nasal washings and aspirates are unacceptable for Xpert Xpress SARS-CoV-2/FLU/RSV testing.  Fact Sheet for Patients: BloggerCourse.com  Fact Sheet for Healthcare Providers: SeriousBroker.it  This test is not yet approved or cleared by the [place] FDA and has been authorized for detection and/or diagnosis of SARS-CoV-2 by FDA under an Emergency Use Authorization (EUA). This EUA will remain in effect (meaning this test can be used) for the duration of the COVID-19 declaration under Section 564(b)(1) of the Act, 21 U.S.C. section 360bbb-3(b)(1), unless the authorization is terminated or revoked.  Performed at [facility], [address], [place], [place] [address]          Radiology Studies: DG Chest 2 View  Result Date: 10/16/2020 CLINICAL DATA:  Shortness of breath with lower extremity edema. EXAM: CHEST - 2 VIEW COMPARISON:  Radiograph yesterday. FINDINGS: Left-sided pacemaker in place. Cardiomegaly is again seen, not significantly changed from yesterday allowing for differences in technique. Worsening interstitial opacities suspicious for pulmonary edema. No significant pleural effusion. Left basilar pulmonary nodule is not well seen on the current exam, obscured by overlapping soft tissue structures. No pneumothorax. Stable osseous structures. IMPRESSION: 1. Worsening interstitial opacities since yesterday suspicious for pulmonary edema. Cardiomegaly not significantly changed. 2. Left lower lobe pulmonary nodule is not well seen on the current exam. Electronically Signed    By: Narda Rutherford M.D.   On: 10/16/2020 19:02        Scheduled Meds: . aspirin  81 mg Oral Daily  . atorvastatin  40 mg Oral Daily  . digoxin  0.125 mg Oral Daily  . DULoxetine  60 mg Oral Daily  . empagliflozin  25 mg Oral Daily  . enoxaparin (LOVENOX) injection  40 mg Subcutaneous Q24H  . fluticasone furoate-vilanterol  1 puff Inhalation Daily  . furosemide  80 mg Intravenous BID  . insulin aspart  0-15 Units Subcutaneous TID WC  . isosorbide dinitrate  40 mg Oral TID  . pantoprazole  40 mg Oral Daily  . pregabalin  150 mg Oral TID  . sacubitril-valsartan  1 tablet Oral BID  . sodium chloride flush  3 mL Intravenous Q12H  . spironolactone  25 mg Oral Daily   Continuous Infusions:  LOS: 1 day    Time spent: 30 minutes    Barb Merino, MD [facility] Pager [phone]

## 2020-10-19 DIAGNOSIS — I5023 Acute on chronic systolic (congestive) heart failure: Secondary | ICD-10-CM | POA: Diagnosis not present

## 2020-10-19 LAB — GLUCOSE, CAPILLARY
Glucose-Capillary: 235 mg/dL — ABNORMAL HIGH (ref 70–99)
Glucose-Capillary: 185 mg/dL — ABNORMAL HIGH (ref 70–99)
Glucose-Capillary: 247 mg/dL — ABNORMAL HIGH (ref 70–99)
Glucose-Capillary: 203 mg/dL — ABNORMAL HIGH (ref 70–99)

## 2020-10-19 LAB — CBC WITH DIFFERENTIAL/PLATELET
Abs Immature Granulocytes: 0.01 10*3/uL (ref 0.00–0.07)
Basophils Absolute: 0 10*3/uL (ref 0.0–0.1)
Basophils Relative: 0 %
Eosinophils Absolute: 0.1 10*3/uL (ref 0.0–0.5)
Eosinophils Relative: 2 %
HCT: 57.8 % — ABNORMAL HIGH (ref 39.0–52.0)
Hemoglobin: 17.5 g/dL — ABNORMAL HIGH (ref 13.0–17.0)
Immature Granulocytes: 0 %
Lymphocytes Relative: 30 %
Lymphs Abs: 1.4 10*3/uL (ref 0.7–4.0)
MCH: 29 pg (ref 26.0–34.0)
MCHC: 30.3 g/dL (ref 30.0–36.0)
MCV: 95.9 fL (ref 80.0–100.0)
Monocytes Absolute: 0.9 10*3/uL (ref 0.1–1.0)
Monocytes Relative: 19 %
Neutro Abs: 2.3 10*3/uL (ref 1.7–7.7)
Neutrophils Relative %: 49 %
Platelets: 225 10*3/uL (ref 150–400)
RBC: 6.03 MIL/uL — ABNORMAL HIGH (ref 4.22–5.81)
RDW: 18.4 % — ABNORMAL HIGH (ref 11.5–15.5)
WBC: 4.6 10*3/uL (ref 4.0–10.5)
nRBC: 0 % (ref 0.0–0.2)

## 2020-10-19 LAB — BASIC METABOLIC PANEL
Anion gap: 11 (ref 5–15)
BUN: 32 mg/dL — ABNORMAL HIGH (ref 6–20)
CO2: 35 mmol/L — ABNORMAL HIGH (ref 22–32)
Calcium: 8.9 mg/dL (ref 8.9–10.3)
Chloride: 88 mmol/L — ABNORMAL LOW (ref 98–111)
Creatinine, Ser: 1.35 mg/dL — ABNORMAL HIGH (ref 0.61–1.24)
GFR, Estimated: >60 mL/min (ref >60)
Glucose, Bld: 118 mg/dL — ABNORMAL HIGH (ref 70–99)
Potassium: 3.6 mmol/L (ref 3.5–5.1)
Sodium: 134 mmol/L — ABNORMAL LOW (ref 135–145)

## 2020-10-19 NOTE — Progress Notes (Signed)
PROGRESS NOTE    Richard [name]  URK:[id number] DOB: 1961/02/01 DOA: 10/16/2020 PCP: Ananias Pilgrim, MD    Brief Narrative:  60 year old gentleman with history of severe systolic congestive heart failure with known ejection fraction 20 to 25%, status post AICD, obstructive sleep apnea not using CPAP, COPD on home oxygen 2 L, CKD stage II, diabetes, GERD, coronary artery disease, anxiety and gout who follows up at advanced heart failure clinic brought to the hospital for admission and IV diuresis by heart failure clinic due to increasing edema of the legs, progressive weight gain despite home dialysis therapy.  In the emergency room hemodynamically stable.  BNP 329.  Chest x-ray with interstitial edema and left lower lobe nodule.  Admitted with IV diuresis.   Assessment & Plan:   Principal Problem:   Acute on chronic systolic CHF (congestive heart failure) (HCC) Active Problems:   COPD (chronic obstructive pulmonary disease) (HCC)   Hypertension   CKD (chronic kidney disease)   GERD (gastroesophageal reflux disease)   Diabetes mellitus with diabetic neuropathy, with long-term current use of insulin (HCC)   CHF exacerbation (HCC)  Acute on chronic systolic congestive heart failure with known ejection fraction 20 to 25%, status post AICD: Patient remains on high-dose IV Lasix with good results, good response to diuresis.  Some clinical improvement today.   Remains on advanced heart failure therapy with Entresto, digoxin, eplerenone.  Hydralazine on hold.  Apparently, not tolerating beta-blockers.  Also on Jardiance.  Followed by cardiology. Daily weight.  Intake and output monitoring.  Coronary artery disease: Stable.  No chest pain.  On aspirin.  Acute kidney injury on chronic kidney disease stage IIIa: Baseline creatinine reportedly 1. 3 -1.5.  Suspect some cardiorenal syndrome.  Improved to at about baseline.  Polycythemia: Chronic hypoxia.  Stable.  Obstructive sleep apnea: Does not  tolerate CPAP.  Wears oxygen instead.  Type 2 diabetes with heart failure: Fairly controlled.  On Jardiance.   DVT prophylaxis: enoxaparin (LOVENOX) injection 40 mg Start: 10/17/20 1000   Code Status: Full code Family Communication: None.  Patient talking to family. Disposition Plan: Status is: Inpatient  Remains inpatient appropriate because:Inpatient level of care appropriate due to severity of illness   Dispo: The patient is from: Home              Anticipated d/c is to: Home              Patient currently is not medically stable to d/c.   Difficult to place patient No         Consultants:   Cardiology  Procedures:   None  Antimicrobials:   None   Subjective: Patient seen and examined.  Denies shortness of breath or chest pain.  Legs are still edematous.  He thinks this is his baseline.  He was wondering why he is in the hospital as nothing has worked to improve his water in his legs. Objective: Vitals:   10/19/20 0831 10/19/20 0851 10/19/20 0952 10/19/20 1100  BP:  104/73 113/87 101/69  Pulse: 72  72 67  Resp: 18  16 18   Temp:    97.9 F (36.6 C)  TempSrc:    Oral  SpO2: 98%  95% 95%  Weight:      Height:        Intake/Output Summary (Last 24 hours) at 10/19/2020 1332 Last data filed at 10/19/2020 0954 Gross per 24 hour  Intake 677 ml  Output 3570 ml  Net -2893 ml  Filed Weights   10/17/20 0010 10/18/20 0448 10/19/20 0219  Weight: 126.1 kg 123.1 kg 123.3 kg    Examination:  General exam: Appears calm and comfortable  Anxious.  Sitting at the edge of the bed.  Mostly on room air. Respiratory system: Bilateral basal crackles. Cardiovascular system: S1 & S2 heard, RRR.  3+ bilateral pedal edema.  On [place] boot. Gastrointestinal system: Distended.  Nontender.  Bowel sounds present. Central nervous system: Alert and oriented. No focal neurological deficits. Extremities: Symmetric 5 x 5 power. Skin: No rashes, lesions or ulcers Psychiatry:  Judgement and insight appear normal. Mood & affect appropriate.     Data Reviewed: I have personally reviewed following labs and imaging studies  CBC: Recent Labs  Lab 10/16/20 1809 10/17/20 0017 10/18/20 0900 10/19/20 0400  WBC 6.3 5.7 5.1 4.6  NEUTROABS 3.2  --  3.1 2.3  HGB 18.1* 17.9* 18.2* 17.5*  HCT 60.1* 59.6* 61.0* 57.8*  MCV 97.7 97.7 98.2 95.9  PLT 270 229 229 225   Basic Metabolic Panel: Recent Labs  Lab 10/16/20 1809 10/17/20 0017 10/18/20 0900 10/19/20 0400  NA 140 140 138 134*  K 4.1 4.6 4.6 3.6  CL 92* 92* 91* 88*  CO2 39* 40* 38* 35*  GLUCOSE 179* 113* 231* 118*  BUN 29* 28* 33* 32*  CREATININE 1.67* 1.74* 1.59* 1.35*  CALCIUM 8.8* 8.6* 9.2 8.9  MG  --  2.2 2.2  --   PHOS  --   --  3.8  --    GFR: Estimated Creatinine Clearance: 86.7 mL/min (A) (by C-G formula based on SCr of 1.35 mg/dL (H)). Liver Function Tests: Recent Labs  Lab 10/16/20 1809 10/17/20 0017  AST 32 33  ALT 65* 51*  ALKPHOS 80 72  BILITOT 1.1 1.2  PROT 7.4 6.7  ALBUMIN 3.5 3.2*   No results for input(s): LIPASE, AMYLASE in the last 168 hours. No results for input(s): AMMONIA in the last 168 hours. Coagulation Profile: No results for input(s): INR, PROTIME in the last 168 hours. Cardiac Enzymes: No results for input(s): CKTOTAL, CKMB, CKMBINDEX, TROPONINI in the last 168 hours. BNP (last 3 results) No results for input(s): PROBNP in the last 8760 hours. HbA1C: No results for input(s): HGBA1C in the last 72 hours. CBG: Recent Labs  Lab 10/18/20 1119 10/18/20 1546 10/18/20 2100 10/19/20 0634 10/19/20 1057  GLUCAP 268* 196* 247* 235* 185*   Lipid Profile: No results for input(s): CHOL, HDL, LDLCALC, TRIG, CHOLHDL, LDLDIRECT in the last 72 hours. Thyroid Function Tests: No results for input(s): TSH, T4TOTAL, FREET4, T3FREE, THYROIDAB in the last 72 hours. Anemia Panel: No results for input(s): VITAMINB12, FOLATE, FERRITIN, TIBC, IRON, RETICCTPCT in the last 72  hours. Sepsis Labs: No results for input(s): PROCALCITON, LATICACIDVEN in the last 168 hours.  Recent Results (from the past 240 hour(s))  Resp Panel by RT-PCR (Flu A&B, Covid) Nasopharyngeal Swab     Status: None   Collection Time: 10/16/20  7:38 PM   Specimen: Nasopharyngeal Swab; Nasopharyngeal(NP) swabs in vial transport medium  Result Value Ref Range Status   SARS Coronavirus 2 by RT PCR NEGATIVE NEGATIVE Final    Comment: (NOTE) SARS-CoV-2 target nucleic acids are NOT DETECTED.  The SARS-CoV-2 RNA is generally detectable in upper respiratory specimens during the acute phase of infection. The lowest concentration of SARS-CoV-2 viral copies this assay can detect is 138 copies/mL. A negative result does not preclude SARS-Cov-2 infection and should not be used as the sole basis  for treatment or other patient management decisions. A negative result may occur with  improper specimen collection/handling, submission of specimen other than nasopharyngeal swab, presence of viral mutation(s) within the areas targeted by this assay, and inadequate number of viral copies(<138 copies/mL). A negative result must be combined with clinical observations, patient history, and epidemiological information. The expected result is Negative.  Fact Sheet for Patients:  BloggerCourse.com  Fact Sheet for Healthcare Providers:  SeriousBroker.it  This test is no t yet approved or cleared by the [place] FDA and  has been authorized for detection and/or diagnosis of SARS-CoV-2 by FDA under an Emergency Use Authorization (EUA). This EUA will remain  in effect (meaning this test can be used) for the duration of the COVID-19 declaration under Section 564(b)(1) of the Act, 21 U.S.C.section 360bbb-3(b)(1), unless the authorization is terminated  or revoked sooner.       Influenza A by PCR NEGATIVE NEGATIVE Final   Influenza B by PCR NEGATIVE  NEGATIVE Final    Comment: (NOTE) The Xpert Xpress SARS-CoV-2/FLU/RSV plus assay is intended as an aid in the diagnosis of influenza from Nasopharyngeal swab specimens and should not be used as a sole basis for treatment. Nasal washings and aspirates are unacceptable for Xpert Xpress SARS-CoV-2/FLU/RSV testing.  Fact Sheet for Patients: BloggerCourse.com  Fact Sheet for Healthcare Providers: SeriousBroker.it  This test is not yet approved or cleared by the [place] FDA and has been authorized for detection and/or diagnosis of SARS-CoV-2 by FDA under an Emergency Use Authorization (EUA). This EUA will remain in effect (meaning this test can be used) for the duration of the COVID-19 declaration under Section 564(b)(1) of the Act, 21 U.S.C. section 360bbb-3(b)(1), unless the authorization is terminated or revoked.  Performed at [facility], [address] [place], [place] [address]          Radiology Studies: No results found.      Scheduled Meds: . aspirin  81 mg Oral Daily  . atorvastatin  40 mg Oral Daily  . digoxin  0.125 mg Oral Daily  . DULoxetine  60 mg Oral Daily  . empagliflozin  25 mg Oral Daily  . enoxaparin (LOVENOX) injection  40 mg Subcutaneous Q24H  . fluticasone furoate-vilanterol  1 puff Inhalation Daily  . furosemide  80 mg Intravenous BID  . insulin aspart  0-15 Units Subcutaneous TID WC  . isosorbide dinitrate  40 mg Oral TID  . pantoprazole  40 mg Oral Daily  . pregabalin  150 mg Oral TID  . sacubitril-valsartan  1 tablet Oral BID  . sodium chloride flush  3 mL Intravenous Q12H  . spironolactone  25 mg Oral Daily   Continuous Infusions:   LOS: 2 days    Time spent: 30 minutes    Dorcas Carrow, MD [facility] Pager [phone]

## 2020-10-19 NOTE — Progress Notes (Addendum)
Progress Note  Patient Name: Richard [name] Date of Encounter: 10/19/2020  [facility] Cardiologist: Marca Ancona, MD  Subjective   Feels breathing is okay. LE edema continues to improve. States his dry weight is ~260lbs. No complaints of chest pain or palpitations.   Inpatient Medications    Scheduled Meds: . aspirin  81 mg Oral Daily  . atorvastatin  40 mg Oral Daily  . digoxin  0.125 mg Oral Daily  . DULoxetine  60 mg Oral Daily  . empagliflozin  25 mg Oral Daily  . enoxaparin (LOVENOX) injection  40 mg Subcutaneous Q24H  . fluticasone furoate-vilanterol  1 puff Inhalation Daily  . furosemide  80 mg Intravenous BID  . insulin aspart  0-15 Units Subcutaneous TID WC  . isosorbide dinitrate  40 mg Oral TID  . pantoprazole  40 mg Oral Daily  . pregabalin  150 mg Oral TID  . sacubitril-valsartan  1 tablet Oral BID  . sodium chloride flush  3 mL Intravenous Q12H  . spironolactone  25 mg Oral Daily   Continuous Infusions:  PRN Meds: acetaminophen **OR** acetaminophen, albuterol, bisacodyl, polyethylene glycol   Vital Signs    Vitals:   10/19/20 0219 10/19/20 0540 10/19/20 0831 10/19/20 0851  BP:  97/65  104/73  Pulse:  (!) 58 72   Resp:  16 18   Temp:  97.7 F (36.5 C)    TempSrc:  Oral    SpO2:  96% 98%   Weight: 123.3 kg     Height:        Intake/Output Summary (Last 24 hours) at 10/19/2020 0953 Last data filed at 10/19/2020 0600 Gross per 24 hour  Intake 1451 ml  Output 4170 ml  Net -2719 ml   Last 3 Weights 10/19/2020 10/18/2020 10/17/2020  Weight (lbs) 271 lb 14.4 oz 271 lb 4.8 oz 277 lb 14.4 oz  Weight (kg) 123.333 kg 123.061 kg 126.055 kg      Telemetry    Sinus rhythm with occasional PVCs - Personally Reviewed  ECG    No new tracings - Personally Reviewed  Physical Exam   GEN: Standing in room in no acute distress.   Neck: No JVD Cardiac: RRR, no murmurs, rubs, or gallops.  Respiratory: Clear to auscultation bilaterally. GI: Soft,  nontender, non-distended  MS: 1-2+ LE edema; No deformity. Neuro:  Nonfocal  Psych: Normal affect   Labs    High Sensitivity Troponin:   Recent Labs  Lab 10/16/20 1809 10/16/20 2100  TROPONINIHS 69* 74*      Chemistry Recent Labs  Lab 10/16/20 1809 10/17/20 0017 10/18/20 0900 10/19/20 0400  NA 140 140 138 134*  K 4.1 4.6 4.6 3.6  CL 92* 92* 91* 88*  CO2 39* 40* 38* 35*  GLUCOSE 179* 113* 231* 118*  BUN 29* 28* 33* 32*  CREATININE 1.67* 1.74* 1.59* 1.35*  CALCIUM 8.8* 8.6* 9.2 8.9  PROT 7.4 6.7  --   --   ALBUMIN 3.5 3.2*  --   --   AST 32 33  --   --   ALT 65* 51*  --   --   ALKPHOS 80 72  --   --   BILITOT 1.1 1.2  --   --   GFRNONAA 47* 45* 50* >60  ANIONGAP 9 8 9 11      Hematology Recent Labs  Lab 10/17/20 0017 10/18/20 0900 10/19/20 0400  WBC 5.7 5.1 4.6  RBC 6.10* 6.21* 6.03*  HGB 17.9* 18.2* 17.5*  HCT 59.6* 61.0* 57.8*  MCV 97.7 98.2 95.9  MCH 29.3 29.3 29.0  MCHC 30.0 29.8* 30.3  RDW 19.0* 18.8* 18.4*  PLT 229 229 225    BNP Recent Labs  Lab 10/16/20 1810  BNP 329.7*     DDimer No results for input(s): DDIMER in the last 168 hours.   Radiology    No results found.  Cardiac Studies   Echocardiogram 06/2020: 1. EF remains severely depressed compared to echo done 07/30/18.  2. Mild pulsus alternans in LVOT signal persists . Left ventricular  ejection fraction, by estimation, is 20 to 25%. The left ventricle has  severely decreased function. The left ventricle demonstrates global  hypokinesis. The left ventricular internal  cavity size was severely dilated. Left ventricular diastolic parameters  were normal.  3. Right ventricular systolic function is mildly reduced. The right  ventricular size is mildly enlarged.  4. Left atrial size was mildly dilated.  5. The mitral valve is normal in structure. Trivial mitral valve  regurgitation. No evidence of mitral stenosis.  6. The aortic valve is normal in structure. Aortic valve  regurgitation is  not visualized. No aortic stenosis is present.  7. The inferior vena cava is dilated in size with <50% respiratory  variability, suggesting right atrial pressure of 15 mmHg.   Patient Profile     60 y.o. male with a PMH of chronic combined CHF, NICM s/p ICD in 2018, CAD s/p PCI/DES to RCA in 2019, HTN, HLD, DM type 2, COPD, and CKD stage 3a, who is being followed by cardiology for acute on chronic combined CHF.  Assessment & Plan    1. Acute on chronic combined CHF: patient presented with worsening LE edema and DOE for the past 2-3 weeks despite increasing home torsemide to 40mg  TID. He had 20lb weight gain and increased O2 demand at home. BNP 329, CXR with interstitial edema. He was started on IV lasix 80mg  BID with UOP net -3.7L in the past 24 hours and -7.8L this admission. Weight is stagnant at 271lbs today, though down from 274lbs on admission. Cr improved to 1.35 today from peak of 1.74 - baseline appears quite labile from 1.3-1.8. Volume overload largely attributed to dietary indiscretion.  - Continue IV lasix 80mg  BID  - Continue to monitor strict I&Os and daily weights - Continue digoxin, isordil, entresto, spironolactone (on eplerenone at home), and jardiance.  - Home bisoprolol discontinued due to dyspnea - Home hydralazine on hold due to soft blood pressures.       2. CAD s/p PCI/DES to RCA in 2019: no recent chest pain complaints. HsTrop with low flat trend not c/w ACS. EKG non-ischemic.  - Continue aspirin and statin  3. HTN: BP soft but stable - Managed in the context of #1  4. HLD: LDL 42 04/2020; goal <70 - Continue atorvastatin  5. DM type 2: No recent A1C on file; goal <7 - Continue jardiance and ongoing management per primary team  6. CKD stage 3a: Cr stable at 1.35 today - Continue to monitor closely with diuresis   7. COPD: never smoked but had work-place exposure. On home O2 prn - Continue management per primary team       For questions  or updates, please contact [facility] Please consult [email or web address] for contact info under        Signed, , PA-C  10/19/2020, 9:53 AM    Personally seen and examined. Agree with above.  Sitting up on the  edge of bed.  More comfortable, less short of breath but not at baseline.  Good diuresis yesterday of approximately 5 L.  Continue with IV Lasix for acute on chronic systolic heart failure.  On goal-directed medical therapy.  Also has chronic lung disease, COPD however never smoked.  No significant crackles on exam.  He is on empagliflozin with diabetes and heart failure.  Donato Schultz, MD

## 2020-10-19 NOTE — Progress Notes (Signed)
Inpatient Diabetes Program Recommendations  AACE/ADA: New Consensus Statement on Inpatient Glycemic Control (2015)  Target Ranges:  Prepandial:   less than 140 mg/dL      Peak postprandial:   less than 180 mg/dL (1-2 hours)      Critically ill patients:  140 - 180 mg/dL   Results for Richard [name], Richard [name] (MRN [id number]) as of 10/19/2020 09:40  Ref. Range 10/18/2020 08:21 10/18/2020 11:19 10/18/2020 15:46 10/18/2020 21:00  Glucose-Capillary Latest Ref Range: 70 - 99 mg/dL 993 (H)  3 units NOVOLOG  268 (H)  8 units NOVOLOG  196 (H)  3 units NOVOLOG  247 (H)   Results for Richard [name], Richard [name] (MRN [id number]) as of 10/19/2020 09:40  Ref. Range 10/19/2020 06:34  Glucose-Capillary Latest Ref Range: 70 - 99 mg/dL 810 (H)  5 units NOVOLOG     Home DM Meds: U-500 Concentrated Insulin per SSI       Jardiance 25 mg Daily  Current Orders: Jardiance 25 mg Daily      Novolog Moderate Correction Scale/ SSI (0-15 units) TID AC     Endocrinologist: Dr. [name] with [facility] Last seen 07/09/2020 Was told to restart her U-500 insulin and continue her Jardiance   MD- Note CBG 235 this AM  Please consider:  1. Start Basal insulin: Lantus 12 units QHS (0.1 units/kg)  2. Start Novolog Meal Coverage: Novolog 4 units TID with meals Hold if pt eats <50% of meal, Hold if pt NPO    --Will follow patient during hospitalization--  Ambrose Finland RN, MSN, CDE Diabetes Coordinator Inpatient Glycemic Control Team Team Pager: [phone] (8a-5p)

## 2020-10-19 NOTE — Progress Notes (Signed)
Heart Failure Stewardship Pharmacist Progress Note   PCP: Ananias Pilgrim, MD PCP-Cardiologist: No primary care provider on file.    HPI:  60 yo M with PMH of CHF s/p ICD in 2018, HTN, DM, and HLD. He presented to the ED on 10/16/20 with shortness of breath and increasing edema. He did not respond well to higher doses of torsemide PTA. Reported baseline weight ~260 lbs. His last ECHO was done on 07/08/20 and LVEF was <20% with mild-moderately reduced RV systolic function.  Current HF Medications: Furosemide 80 mg IV BID Entresto 97/103 mg BID Spironolactone 25 mg daily Jardiance 25 mg daily Isordil 40 mg TID Digoxin 0.125 mg daily  Prior to admission HF Medications: Entresto 97/103 mg BID Eplerenone 50 mg daily Jardiance 25 mg daily Hydralazine 75 mg TID Isordil 40 mg TID  Pertinent Lab Values: . Serum creatinine 1.35, BUN 32, Potassium 3.6, Sodium 134, BNP 329.7, Magnesium 2.2, Digoxin <0.2 on 4/24   Vital Signs: . Weight: 271 lbs (admission weight: 274 lbs) . Blood pressure: 100/70s  . Heart rate: 70-80s   Medication Assistance / Insurance Benefits Check: Does the patient have prescription insurance?  Yes Type of insurance plan: [place] Medicaid  Outpatient Pharmacy:  Prior to admission outpatient pharmacy: [facility] Is the patient willing to use [facility] TOC pharmacy at discharge? Yes Is the patient willing to transition their outpatient pharmacy to utilize a [facility] outpatient pharmacy?   Pending    Assessment: 1. Acute on chronic systolic CHF (EF <76%), due to NICM. LHC/RHC 11/06/17 showed volume overload with 80% RCA stenosis. Cardiac index low at 1.91. PYP scan in 7/21 not suggestive of TTR amyloidosis. NYHA class III symptoms. - Continue furosemide 80 mg IV BID - Previously did not tolerate bisoprolol - would hold off initiating BB until he's euvolemic - Continue Entresto 97/103 mg BID - Continue spironolactone 25 mg daily - he takes eplerenone 50 mg daily PTA  as he had gynecomastia side effects with spironolactone in the past - Continue Jardiance 25 mg daily - Continue Isordil 40 mg TID - BP too soft to restart hydralazine at this time - Continue digoxin 0.125 mg daily. Level checked on 4/24 and was <0.2   Plan: 1) Medication changes recommended at this time: - Continue IV diuresis  2) Patient assistance: - Patient has [place] Medicaid - all copays should be $0-3 per month - Sees [facility] as outpatient and is followed by paramedicine team  3)  Education  - To be completed prior to discharge  Sharen Hones, PharmD, BCPS Heart Failure Stewardship Pharmacist Phone [phone]

## 2020-10-20 LAB — CBC WITH DIFFERENTIAL/PLATELET
Abs Immature Granulocytes: 0.01 10*3/uL (ref 0.00–0.07)
Basophils Absolute: 0 10*3/uL (ref 0.0–0.1)
Basophils Relative: 1 %
Eosinophils Absolute: 0.1 10*3/uL (ref 0.0–0.5)
Eosinophils Relative: 1 %
HCT: 60 % — ABNORMAL HIGH (ref 39.0–52.0)
Hemoglobin: 18.3 g/dL — ABNORMAL HIGH (ref 13.0–17.0)
Immature Granulocytes: 0 %
Lymphocytes Relative: 29 %
Lymphs Abs: 1.4 10*3/uL (ref 0.7–4.0)
MCH: 29.1 pg (ref 26.0–34.0)
MCHC: 30.5 g/dL (ref 30.0–36.0)
MCV: 95.5 fL (ref 80.0–100.0)
Monocytes Absolute: 0.8 10*3/uL (ref 0.1–1.0)
Monocytes Relative: 17 %
Neutro Abs: 2.5 10*3/uL (ref 1.7–7.7)
Neutrophils Relative %: 52 %
Platelets: 229 10*3/uL (ref 150–400)
RBC: 6.28 MIL/uL — ABNORMAL HIGH (ref 4.22–5.81)
RDW: 18.3 % — ABNORMAL HIGH (ref 11.5–15.5)
WBC: 4.8 10*3/uL (ref 4.0–10.5)
nRBC: 0 % (ref 0.0–0.2)

## 2020-10-20 LAB — BASIC METABOLIC PANEL
Anion gap: 10 (ref 5–15)
BUN: 32 mg/dL — ABNORMAL HIGH (ref 6–20)
CO2: 36 mmol/L — ABNORMAL HIGH (ref 22–32)
Calcium: 9.1 mg/dL (ref 8.9–10.3)
Chloride: 90 mmol/L — ABNORMAL LOW (ref 98–111)
Creatinine, Ser: 1.34 mg/dL — ABNORMAL HIGH (ref 0.61–1.24)
GFR, Estimated: >60 mL/min (ref >60)
Glucose, Bld: 214 mg/dL — ABNORMAL HIGH (ref 70–99)
Potassium: 4.1 mmol/L (ref 3.5–5.1)
Sodium: 136 mmol/L (ref 135–145)

## 2020-10-20 LAB — GLUCOSE, CAPILLARY
Glucose-Capillary: 249 mg/dL — ABNORMAL HIGH (ref 70–99)
Glucose-Capillary: 211 mg/dL — ABNORMAL HIGH (ref 70–99)
Glucose-Capillary: 190 mg/dL — ABNORMAL HIGH (ref 70–99)
Glucose-Capillary: 223 mg/dL — ABNORMAL HIGH (ref 70–99)

## 2020-10-20 MED ORDER — INSULIN ASPART 100 UNIT/ML ~~LOC~~ SOLN
4.0000 [IU] | Freq: Three times a day (TID) | SUBCUTANEOUS | Status: DC
  Administered 2020-10-20 (×2): 4 [IU] via SUBCUTANEOUS

## 2020-10-20 MED ORDER — INSULIN GLARGINE 100 UNIT/ML ~~LOC~~ SOLN
12.0000 [IU] | Freq: Every day | SUBCUTANEOUS | Status: DC
  Administered 2020-10-20 – 2020-10-21 (×2): 12 [IU] via SUBCUTANEOUS
  Filled 2020-10-20 (×2): qty 0.12

## 2020-10-20 NOTE — Progress Notes (Signed)
Heart Failure Stewardship Pharmacist Progress Note   PCP: Ananias Pilgrim, MD PCP-Cardiologist: No primary care provider on file.    HPI:  60 yo M with PMH of CHF s/p ICD in 2018, HTN, DM, and HLD. He presented to the ED on 10/16/20 with shortness of breath and increasing edema. He did not respond well to higher doses of torsemide PTA. Reported baseline weight ~260 lbs. His last ECHO was done on 07/08/20 and LVEF was <20% with mild-moderately reduced RV systolic function.  Current HF Medications: Furosemide 80 mg IV BID Entresto 97/103 mg BID Spironolactone 25 mg daily Jardiance 25 mg daily Isordil 40 mg TID Digoxin 0.125 mg daily  Prior to admission HF Medications: Entresto 97/103 mg BID Eplerenone 50 mg daily Jardiance 25 mg daily Hydralazine 75 mg TID Isordil 40 mg TID  Pertinent Lab Values: . Serum creatinine 1.34, BUN 32, Potassium 4.1, Sodium 136, BNP 329.7, Digoxin <0.2 on 4/24   Vital Signs: . Weight: 268 lbs (admission weight: 274 lbs) . Blood pressure: 110/80s  . Heart rate: 80s   Medication Assistance / Insurance Benefits Check: Does the patient have prescription insurance?  Yes Type of insurance plan: [place] Medicaid  Outpatient Pharmacy:  Prior to admission outpatient pharmacy: [facility] Is the patient willing to use [facility] TOC pharmacy at discharge? Yes Is the patient willing to transition their outpatient pharmacy to utilize a [facility] outpatient pharmacy?   Pending    Assessment: 1. Acute on chronic systolic CHF (EF <41%), due to NICM. LHC/RHC 11/06/17 showed volume overload with 80% RCA stenosis. Cardiac index low at 1.91. PYP scan in 7/21 not suggestive of TTR amyloidosis. NYHA class III symptoms. - Continue furosemide 80 mg IV BID - Previously did not tolerate bisoprolol - would hold off initiating BB until he's euvolemic - Continue Entresto 97/103 mg BID - Continue spironolactone 25 mg daily - he takes eplerenone 50 mg daily PTA as he had  gynecomastia side effects with spironolactone in the past - Continue Jardiance 25 mg daily - Continue Isordil 40 mg TID - BP beginning to improve, may be able to resume lower dose of hydralazine soon - Continue digoxin 0.125 mg daily. Level checked on 4/24 and was <0.2   Plan: 1) Medication changes recommended at this time: - Continue IV diuresis  2) Patient assistance: - Patient has [name] Medicaid - all copays should be $0-3 per month - Sees [facility] as outpatient and is followed by paramedicine team  3)  Education  - To be completed prior to discharge  Sharen Hones, PharmD, BCPS Heart Failure Stewardship Pharmacist Phone [phone]

## 2020-10-20 NOTE — Progress Notes (Addendum)
Progress Note  Patient Name: Richard [name] Date of Encounter: 10/20/2020  [facility] Cardiologist: Marca Ancona, MD   Subjective   Breathing continues to be stable. He notes improvement in LE edema, though not at baseline yet. No complaints of chest pain or palpitations.  Inpatient Medications    Scheduled Meds: . aspirin  81 mg Oral Daily  . atorvastatin  40 mg Oral Daily  . digoxin  0.125 mg Oral Daily  . DULoxetine  60 mg Oral Daily  . empagliflozin  25 mg Oral Daily  . enoxaparin (LOVENOX) injection  40 mg Subcutaneous Q24H  . fluticasone furoate-vilanterol  1 puff Inhalation Daily  . furosemide  80 mg Intravenous BID  . insulin aspart  0-15 Units Subcutaneous TID WC  . insulin aspart  4 Units Subcutaneous TID WC  . insulin glargine  12 Units Subcutaneous Daily  . isosorbide dinitrate  40 mg Oral TID  . pantoprazole  40 mg Oral Daily  . pregabalin  150 mg Oral TID  . sacubitril-valsartan  1 tablet Oral BID  . sodium chloride flush  3 mL Intravenous Q12H  . spironolactone  25 mg Oral Daily   Continuous Infusions:  PRN Meds: acetaminophen **OR** acetaminophen, albuterol, bisacodyl, polyethylene glycol   Vital Signs    Vitals:   10/20/20 0326 10/20/20 0328 10/20/20 0917 10/20/20 0929  BP: 110/81  117/78   Pulse: 72  83   Resp: 18  18   Temp: 97.8 F (36.6 C)  98 F (36.7 C)   TempSrc: Oral  Oral   SpO2: 94%  93% 93%  Weight:  121.8 kg    Height:        Intake/Output Summary (Last 24 hours) at 10/20/2020 0933 Last data filed at 10/20/2020 4098 Gross per 24 hour  Intake 1043 ml  Output 3350 ml  Net -2307 ml   Last 3 Weights 10/20/2020 10/19/2020 10/18/2020  Weight (lbs) 268 lb 9.6 oz 271 lb 14.4 oz 271 lb 4.8 oz  Weight (kg) 121.836 kg 123.333 kg 123.061 kg      Telemetry    Sinus rhythm with 1st degree AV block, occasional PVCs - Personally Reviewed  ECG    No new tracings - Personally Reviewed  Physical Exam   GEN: No acute distress.    Neck: No JVD Cardiac: RRR, no murmurs, rubs, or gallops.  Respiratory: Clear to auscultation bilaterally. GI: Soft, nontender, non-distended  MS: 1+ LE edema; No deformity. Neuro:  Nonfocal  Psych: Normal affect   Labs    High Sensitivity Troponin:   Recent Labs  Lab 10/16/20 1809 10/16/20 2100  TROPONINIHS 69* 74*      Chemistry Recent Labs  Lab 10/16/20 1809 10/17/20 0017 10/18/20 0900 10/19/20 0400 10/20/20 0616  NA 140 140 138 134* 136  K 4.1 4.6 4.6 3.6 4.1  CL 92* 92* 91* 88* 90*  CO2 39* 40* 38* 35* 36*  GLUCOSE 179* 113* 231* 118* 214*  BUN 29* 28* 33* 32* 32*  CREATININE 1.67* 1.74* 1.59* 1.35* 1.34*  CALCIUM 8.8* 8.6* 9.2 8.9 9.1  PROT 7.4 6.7  --   --   --   ALBUMIN 3.5 3.2*  --   --   --   AST 32 33  --   --   --   ALT 65* 51*  --   --   --   ALKPHOS 80 72  --   --   --   BILITOT 1.1 1.2  --   --   --  GFRNONAA 47* 45* 50* >60 >60  ANIONGAP 9 8 9 11 10      Hematology Recent Labs  Lab 10/18/20 0900 10/19/20 0400 10/20/20 0616  WBC 5.1 4.6 4.8  RBC 6.21* 6.03* 6.28*  HGB 18.2* 17.5* 18.3*  HCT 61.0* 57.8* 60.0*  MCV 98.2 95.9 95.5  MCH 29.3 29.0 29.1  MCHC 29.8* 30.3 30.5  RDW 18.8* 18.4* 18.3*  PLT 229 225 229    BNP Recent Labs  Lab 10/16/20 1810  BNP 329.7*     DDimer No results for input(s): DDIMER in the last 168 hours.   Radiology    No results found.  Cardiac Studies   Echocardiogram 06/2020: 1. EF remains severely depressed compared to echo done 07/30/18.  2. Mild pulsus alternans in LVOT signal persists . Left ventricular  ejection fraction, by estimation, is 20 to 25%. The left ventricle has  severely decreased function. The left ventricle demonstrates global  hypokinesis. The left ventricular internal  cavity size was severely dilated. Left ventricular diastolic parameters  were normal.  3. Right ventricular systolic function is mildly reduced. The right  ventricular size is mildly enlarged.  4. Left atrial  size was mildly dilated.  5. The mitral valve is normal in structure. Trivial mitral valve  regurgitation. No evidence of mitral stenosis.  6. The aortic valve is normal in structure. Aortic valve regurgitation is  not visualized. No aortic stenosis is present.  7. The inferior vena cava is dilated in size with <50% respiratory  variability, suggesting right atrial pressure of 15 mmHg.   Patient Profile     60 y.o. male with a PMH of chronic combined CHF, NICM s/p ICD in 2018, CAD s/p PCI/DES to RCA in 2019, HTN, HLD, DM type 2, COPD, and CKD stage 3a, who is being followed by cardiology for acute on chronic combined CHF.  Assessment & Plan    1. Acute on chronic combined CHF: patient presented with worsening LE edema and DOE for the past 2-3 weeks despite increasing home torsemide to 40mg  TID. He had 20lb weight gain and increased O2 demand at home. BNP 329, CXR with interstitial edema. He was started on IV lasix 80mg  BID with UOP net -2.1L in the past 24 hours and -9.9L this admission. Weight is down to 268lbs today from 274lbs on admission. Cr improved to 1.34 today from peak of 1.74 - baseline appears quite labile from 1.3-1.8. Volume overload largely attributed to dietary indiscretion.  - Continue IV lasix 80mg  BID - hopeful to transition to po torsemide tomorrow - Continue to monitor strict I&Os and daily weights - Continue digoxin, isordil, entresto, spironolactone (on eplerenone at home), and jardiance.  - Home bisoprolol discontinued due to dyspnea - Home hydralazine on hold due to soft blood pressures.       2. CAD s/p PCI/DES to RCA in 2019: no recent chest pain complaints. HsTrop with low flat trend not c/w ACS. EKG non-ischemic.  - Continue aspirin and statin  3. HTN: BP soft but stable - Managed in the context of #1  4. HLD: LDL 42 04/2020; goal <70 - Continue atorvastatin  5. DM type 2: No recent A1C on file; goal <7 - Continue jardiance and ongoing management per  primary team  6. CKD stage 3a: Cr stable at 1.34 today - Continue to monitor closely with diuresis   7. COPD: never smoked but had work-place exposure. On home O2 prn - Continue management per primary team  For questions or updates, please contact [facility] Please consult [email or web address] for contact info under        Signed, Beatriz Stallion, PA-C  10/20/2020, 9:33 AM    Personally seen and examined. Agree with above.   Continuing with IV diuresis again today 80 mg twice daily.  We will likely transition to torsemide tomorrow.  Also on other goal-directed therapies.  Doing well.  Improving but still not quite at baseline.  Lungs are improved.  He is alert.  Donato Schultz, MD

## 2020-10-20 NOTE — Progress Notes (Signed)
PROGRESS NOTE    Richard [name]  WIO:[id number] DOB: Jan 29, 1961 DOA: 10/16/2020 PCP: Ananias Pilgrim, MD    Brief Narrative:  60 year old gentleman with history of severe systolic congestive heart failure with known ejection fraction 20 to 25%, status post AICD, obstructive sleep apnea not using CPAP, COPD on home oxygen 2 L, CKD stage II, diabetes, GERD, coronary artery disease, anxiety and gout who follows up at advanced heart failure clinic brought to the hospital for admission and IV diuresis by heart failure clinic due to increasing edema of the legs, progressive weight gain despite home dialysis therapy.  In the emergency room hemodynamically stable.  BNP 329.  Chest x-ray with interstitial edema and left lower lobe nodule.  Admitted with IV diuresis.   Assessment & Plan:   Principal Problem:   Acute on chronic systolic CHF (congestive heart failure) (HCC) Active Problems:   COPD (chronic obstructive pulmonary disease) (HCC)   Hypertension   CKD (chronic kidney disease)   GERD (gastroesophageal reflux disease)   Diabetes mellitus with diabetic neuropathy, with long-term current use of insulin (HCC)   CHF exacerbation (HCC)  Acute on chronic systolic congestive heart failure with known ejection fraction 20 to 25%, status post AICD: Patient remains on high-dose IV Lasix with good results, good response to diuresis. Negative balance 10 L since admission.  More than 3 L urine output last 24 hours. Some clinical improvement.  Leg also has chronic venous stasis changes and chronic edema. Remains on advanced heart failure therapy with Entresto, digoxin, eplerenone.  Hydralazine on hold.  Apparently, not tolerating beta-blockers.  Also on Jardiance.  Followed by cardiology. Daily weight.  Intake and output monitoring. Discharge when able to convert to oral diuresis.  Coronary artery disease: Stable.  No chest pain.  On aspirin.  Acute kidney injury on chronic kidney disease stage IIIa:  Baseline creatinine reportedly 1. 3 -1.5.  Suspect some cardiorenal syndrome.  Improved to at about baseline.  Polycythemia: Chronic hypoxia.  Stable.  Obstructive sleep apnea: Does not tolerate CPAP.  Wears oxygen instead.  Type 2 diabetes with heart failure:  Last known A1c of 9.8. Blood sugars elevated today.  He is on  U 400 concentrated insulin sliding scale at home and Jardiance.  With persistent elevated blood sugars, will start patient on Lantus and prandial insulin.  Will recheck A1c with morning labs. Will discuss with patient about scheduled long-acting insulin on discharge.   DVT prophylaxis: enoxaparin (LOVENOX) injection 40 mg Start: 10/17/20 1000   Code Status: Full code Family Communication: None at the bedside. Disposition Plan: Status is: Inpatient  Remains inpatient appropriate because:Inpatient level of care appropriate due to severity of illness   Dispo: The patient is from: Home              Anticipated d/c is to: Home              Patient currently is not medically stable to d/c.   Difficult to place patient No         Consultants:   Cardiology  Procedures:   None  Antimicrobials:   None   Subjective: Patient seen and examined.  No overnight events.  Denies any complaints at rest.  He thinks his walking capacity has somehow improved. Legs were tight , however now they feel somehow soft.   Objective: Vitals:   10/20/20 0326 10/20/20 0328 10/20/20 0917 10/20/20 0929  BP: 110/81  117/78   Pulse: 72  83   Resp: 18  18   Temp: 97.8 F (36.6 C)  98 F (36.7 C)   TempSrc: Oral  Oral   SpO2: 94%  93% 93%  Weight:  121.8 kg    Height:        Intake/Output Summary (Last 24 hours) at 10/20/2020 1114 Last data filed at 10/20/2020 4481 Gross per 24 hour  Intake 843 ml  Output 2950 ml  Net -2107 ml   Filed Weights   10/18/20 0448 10/19/20 0219 10/20/20 0328  Weight: 123.1 kg 123.3 kg 121.8 kg    Examination:  General exam: Appears  calm and comfortable , on room air. Respiratory system: Bilateral basal crackles prominent on posterior base. Cardiovascular system: S1 & S2 heard, RRR.  2+ bilateral pedal edema, able to have more pitting today.  Chronic venous stasis changes and discoloration. Gastrointestinal system: Soft and nontender. Central nervous system: Alert and oriented. No focal neurological deficits. Extremities: Symmetric 5 x 5 power. Skin: No rashes, lesions or ulcers Psychiatry: Judgement and insight appear normal. Mood & affect appropriate.     Data Reviewed: I have personally reviewed following labs and imaging studies  CBC: Recent Labs  Lab 10/16/20 1809 10/17/20 0017 10/18/20 0900 10/19/20 0400 10/20/20 0616  WBC 6.3 5.7 5.1 4.6 4.8  NEUTROABS 3.2  --  3.1 2.3 2.5  HGB 18.1* 17.9* 18.2* 17.5* 18.3*  HCT 60.1* 59.6* 61.0* 57.8* 60.0*  MCV 97.7 97.7 98.2 95.9 95.5  PLT 270 229 229 225 229   Basic Metabolic Panel: Recent Labs  Lab 10/16/20 1809 10/17/20 0017 10/18/20 0900 10/19/20 0400 10/20/20 0616  NA 140 140 138 134* 136  K 4.1 4.6 4.6 3.6 4.1  CL 92* 92* 91* 88* 90*  CO2 39* 40* 38* 35* 36*  GLUCOSE 179* 113* 231* 118* 214*  BUN 29* 28* 33* 32* 32*  CREATININE 1.67* 1.74* 1.59* 1.35* 1.34*  CALCIUM 8.8* 8.6* 9.2 8.9 9.1  MG  --  2.2 2.2  --   --   PHOS  --   --  3.8  --   --    GFR: Estimated Creatinine Clearance: 86.9 mL/min (A) (by C-G formula based on SCr of 1.34 mg/dL (H)). Liver Function Tests: Recent Labs  Lab 10/16/20 1809 10/17/20 0017  AST 32 33  ALT 65* 51*  ALKPHOS 80 72  BILITOT 1.1 1.2  PROT 7.4 6.7  ALBUMIN 3.5 3.2*   No results for input(s): LIPASE, AMYLASE in the last 168 hours. No results for input(s): AMMONIA in the last 168 hours. Coagulation Profile: No results for input(s): INR, PROTIME in the last 168 hours. Cardiac Enzymes: No results for input(s): CKTOTAL, CKMB, CKMBINDEX, TROPONINI in the last 168 hours. BNP (last 3 results) No results  for input(s): PROBNP in the last 8760 hours. HbA1C: No results for input(s): HGBA1C in the last 72 hours. CBG: Recent Labs  Lab 10/19/20 0634 10/19/20 1057 10/19/20 1620 10/19/20 2059 10/20/20 0623  GLUCAP 235* 185* 247* 203* 249*   Lipid Profile: No results for input(s): CHOL, HDL, LDLCALC, TRIG, CHOLHDL, LDLDIRECT in the last 72 hours. Thyroid Function Tests: No results for input(s): TSH, T4TOTAL, FREET4, T3FREE, THYROIDAB in the last 72 hours. Anemia Panel: No results for input(s): VITAMINB12, FOLATE, FERRITIN, TIBC, IRON, RETICCTPCT in the last 72 hours. Sepsis Labs: No results for input(s): PROCALCITON, LATICACIDVEN in the last 168 hours.  Recent Results (from the past 240 hour(s))  Resp Panel by RT-PCR (Flu A&B, Covid) Nasopharyngeal Swab     Status: None  Collection Time: 10/16/20  7:38 PM   Specimen: Nasopharyngeal Swab; Nasopharyngeal(NP) swabs in vial transport medium  Result Value Ref Range Status   SARS Coronavirus 2 by RT PCR NEGATIVE NEGATIVE Final    Comment: (NOTE) SARS-CoV-2 target nucleic acids are NOT DETECTED.  The SARS-CoV-2 RNA is generally detectable in upper respiratory specimens during the acute phase of infection. The lowest concentration of SARS-CoV-2 viral copies this assay can detect is 138 copies/mL. A negative result does not preclude SARS-Cov-2 infection and should not be used as the sole basis for treatment or other patient management decisions. A negative result may occur with  improper specimen collection/handling, submission of specimen other than nasopharyngeal swab, presence of viral mutation(s) within the areas targeted by this assay, and inadequate number of viral copies(<138 copies/mL). A negative result must be combined with clinical observations, patient history, and epidemiological information. The expected result is Negative.  Fact Sheet for Patients:  BloggerCourse.com  Fact Sheet for Healthcare  Providers:  SeriousBroker.it  This test is no t yet approved or cleared by the [place] FDA and  has been authorized for detection and/or diagnosis of SARS-CoV-2 by FDA under an Emergency Use Authorization (EUA). This EUA will remain  in effect (meaning this test can be used) for the duration of the COVID-19 declaration under Section 564(b)(1) of the Act, 21 U.S.C.section 360bbb-3(b)(1), unless the authorization is terminated  or revoked sooner.       Influenza A by PCR NEGATIVE NEGATIVE Final   Influenza B by PCR NEGATIVE NEGATIVE Final    Comment: (NOTE) The Xpert Xpress SARS-CoV-2/FLU/RSV plus assay is intended as an aid in the diagnosis of influenza from Nasopharyngeal swab specimens and should not be used as a sole basis for treatment. Nasal washings and aspirates are unacceptable for Xpert Xpress SARS-CoV-2/FLU/RSV testing.  Fact Sheet for Patients: BloggerCourse.com  Fact Sheet for Healthcare Providers: SeriousBroker.it  This test is not yet approved or cleared by the [place] FDA and has been authorized for detection and/or diagnosis of SARS-CoV-2 by FDA under an Emergency Use Authorization (EUA). This EUA will remain in effect (meaning this test can be used) for the duration of the COVID-19 declaration under Section 564(b)(1) of the Act, 21 U.S.C. section 360bbb-3(b)(1), unless the authorization is terminated or revoked.  Performed at [facility], [address] [place], [place] [address]          Radiology Studies: No results found.      Scheduled Meds: . aspirin  81 mg Oral Daily  . atorvastatin  40 mg Oral Daily  . digoxin  0.125 mg Oral Daily  . DULoxetine  60 mg Oral Daily  . empagliflozin  25 mg Oral Daily  . enoxaparin (LOVENOX) injection  40 mg Subcutaneous Q24H  . fluticasone furoate-vilanterol  1 puff Inhalation Daily  . furosemide  80 mg  Intravenous BID  . insulin aspart  0-15 Units Subcutaneous TID WC  . insulin aspart  4 Units Subcutaneous TID WC  . insulin glargine  12 Units Subcutaneous Daily  . isosorbide dinitrate  40 mg Oral TID  . pantoprazole  40 mg Oral Daily  . pregabalin  150 mg Oral TID  . sacubitril-valsartan  1 tablet Oral BID  . sodium chloride flush  3 mL Intravenous Q12H  . spironolactone  25 mg Oral Daily   Continuous Infusions:   LOS: 3 days    Time spent: 30 minutes    Dorcas Carrow, MD [facility] Pager  [phone] 

## 2020-10-21 LAB — CBC WITH DIFFERENTIAL/PLATELET
Abs Immature Granulocytes: 0.01 10*3/uL (ref 0.00–0.07)
Basophils Absolute: 0 10*3/uL (ref 0.0–0.1)
Basophils Relative: 1 %
Eosinophils Absolute: 0.1 10*3/uL (ref 0.0–0.5)
Eosinophils Relative: 1 %
HCT: 59.6 % — ABNORMAL HIGH (ref 39.0–52.0)
Hemoglobin: 18.4 g/dL — ABNORMAL HIGH (ref 13.0–17.0)
Immature Granulocytes: 0 %
Lymphocytes Relative: 37 %
Lymphs Abs: 1.5 10*3/uL (ref 0.7–4.0)
MCH: 28.9 pg (ref 26.0–34.0)
MCHC: 30.9 g/dL (ref 30.0–36.0)
MCV: 93.7 fL (ref 80.0–100.0)
Monocytes Absolute: 0.6 10*3/uL (ref 0.1–1.0)
Monocytes Relative: 16 %
Neutro Abs: 1.7 10*3/uL (ref 1.7–7.7)
Neutrophils Relative %: 45 %
Platelets: 216 10*3/uL (ref 150–400)
RBC: 6.36 MIL/uL — ABNORMAL HIGH (ref 4.22–5.81)
RDW: 18.6 % — ABNORMAL HIGH (ref 11.5–15.5)
WBC: 3.9 10*3/uL — ABNORMAL LOW (ref 4.0–10.5)
nRBC: 0 % (ref 0.0–0.2)

## 2020-10-21 LAB — BASIC METABOLIC PANEL
Anion gap: 9 (ref 5–15)
BUN: 32 mg/dL — ABNORMAL HIGH (ref 6–20)
CO2: 32 mmol/L (ref 22–32)
Calcium: 9 mg/dL (ref 8.9–10.3)
Chloride: 94 mmol/L — ABNORMAL LOW (ref 98–111)
Creatinine, Ser: 1.31 mg/dL — ABNORMAL HIGH (ref 0.61–1.24)
GFR, Estimated: >60 mL/min (ref >60)
Glucose, Bld: 204 mg/dL — ABNORMAL HIGH (ref 70–99)
Potassium: 3.9 mmol/L (ref 3.5–5.1)
Sodium: 135 mmol/L (ref 135–145)

## 2020-10-21 LAB — GLUCOSE, CAPILLARY
Glucose-Capillary: 285 mg/dL — ABNORMAL HIGH (ref 70–99)
Glucose-Capillary: 45 mg/dL — ABNORMAL LOW (ref 70–99)
Glucose-Capillary: 48 mg/dL — ABNORMAL LOW (ref 70–99)
Glucose-Capillary: 54 mg/dL — ABNORMAL LOW (ref 70–99)
Glucose-Capillary: 97 mg/dL (ref 70–99)
Glucose-Capillary: 133 mg/dL — ABNORMAL HIGH (ref 70–99)

## 2020-10-21 MED ORDER — INSULIN ASPART 100 UNIT/ML ~~LOC~~ SOLN
4.0000 [IU] | Freq: Three times a day (TID) | SUBCUTANEOUS | Status: DC
  Administered 2020-10-21: 4 [IU] via SUBCUTANEOUS

## 2020-10-21 MED ORDER — INSULIN ASPART 100 UNIT/ML ~~LOC~~ SOLN
0.0000 [IU] | Freq: Three times a day (TID) | SUBCUTANEOUS | Status: DC
  Administered 2020-10-21: 8 [IU] via SUBCUTANEOUS

## 2020-10-21 MED ORDER — INSULIN ASPART 100 UNIT/ML ~~LOC~~ SOLN: 0.0000 [IU] | Freq: Three times a day (TID) | SUBCUTANEOUS | Status: DC

## 2020-10-21 MED ORDER — TORSEMIDE 20 MG PO TABS: 40.0000 mg | ORAL_TABLET | Freq: Two times a day (BID) | ORAL | Status: DC

## 2020-10-21 NOTE — TOC Transition Note (Signed)
Transition of Care [facility]) - CM/SW Discharge Note   Patient Details  Name: Richard [name] MRN: [id number] Date of Birth: June 03, 1961  Transition of Care [facility]) CM/SW Contact:  Leone Haven, RN Phone Number: 10/21/2020, 11:55 AM   Clinical Narrative:    Patient is for dc today, NCM spoke with him at bedside, he has a bp cuff and a scale at home, he lives with wife, he states he has been trying to eat a low sodium diet but needs to do better.  He also wants a pulse oximetry, he is ok with [employer] supplying this. NCM made referral to lacretia with [facility] for the pulse ox.  He states he gets his meds from [facility] by blister pack which helps him out a lot.  He also has oxygen at home that he uses as prn.     Final next level of care: Home/Self Care Barriers to Discharge: No Barriers Identified   Patient Goals and CMS Choice        Discharge Placement                       Discharge Plan and Services                                     Social Determinants of Health (SDOH) Interventions     Readmission Risk Interventions No flowsheet data found.

## 2020-10-21 NOTE — Care Management Important Message (Signed)
Important Message  Patient Details  Name: Richard [name] MRN: [id number] Date of Birth: 02-05-1961   Medicare Important Message Given:  Yes     Renie Ora 10/21/2020, 8:59 AM

## 2020-10-21 NOTE — Progress Notes (Signed)
Heart Failure Stewardship Pharmacist Progress Note   PCP: Ananias Pilgrim, MD PCP-Cardiologist: No primary care provider on file.    HPI:  60 yo M with PMH of CHF s/p ICD in 2018, HTN, DM, and HLD. He presented to the ED on 10/16/20 with shortness of breath and increasing edema. He did not respond well to higher doses of torsemide PTA. Reported baseline weight ~260 lbs. His last ECHO was done on 07/08/20 and LVEF was <20% with mild-moderately reduced RV systolic function.  Discharge HF Medications: Torsemide 40 mg BID Entresto 97/103 mg BID Eplerenone 50 mg daily Jardiance 25 mg daily Isordil 40 mg TID Hydralazine 75 mg TID Digoxin 0.125 mg daily  Prior to admission HF Medications: Entresto 97/103 mg BID Eplerenone 50 mg daily Jardiance 25 mg daily Hydralazine 75 mg TID Isordil 40 mg TID  Pertinent Lab Values: . Serum creatinine 1.31, BUN 32, Potassium 3.9, Sodium 135, BNP 329.7, Digoxin <0.2 on 4/24   Vital Signs: . Weight: 266 lbs (admission weight: 274 lbs) . Blood pressure: 110/80s  . Heart rate: 80s   Medication Assistance / Insurance Benefits Check: Does the patient have prescription insurance?  Yes Type of insurance plan: [place] Medicaid  Outpatient Pharmacy:  Prior to admission outpatient pharmacy: [facility] Is the patient willing to use [facility] TOC pharmacy at discharge? Yes Is the patient willing to transition their outpatient pharmacy to utilize a [facility] outpatient pharmacy?   Pending    Assessment: 1. Acute on chronic systolic CHF (EF <12%), due to NICM. LHC/RHC 11/06/17 showed volume overload with 80% RCA stenosis. Cardiac index low at 1.91. PYP scan in 7/21 not suggestive of TTR amyloidosis. NYHA class II symptoms. - Agree with transitioning to torsemide 40 mg BID at discharge - Previously did not tolerate bisoprolol - would hold off initiating BB until follow up - Continue Entresto 97/103 mg BID - Continue eplerenone 50 mg daily at discharge as he  had gynecomastia side effects with spironolactone in the past - Continue Jardiance 25 mg daily - Continue Isordil 40 mg TID - Continue hydralazine 75 mg TID at discharge - Continue digoxin 0.125 mg daily. Level checked on 4/24 and was <0.2   Plan: 1) Medication changes recommended at this time: - Discharge today  2) Patient assistance: - Patient has [place] Medicaid - all copays should be $0-3 per month - Sees [facility] as outpatient and is followed by paramedicine team   Sharen Hones, PharmD, BCPS Heart Failure Stewardship Pharmacist Phone [phone]

## 2020-10-21 NOTE — Progress Notes (Signed)
Progress Note  Patient Name: Richard [name] Date of Encounter: 10/21/2020  [facility] Cardiologist: Marca Ancona, MD   Subjective   Doing well.  Breathing improved.  Lower extremity markedly improved.  Inpatient Medications    Scheduled Meds: . aspirin  81 mg Oral Daily  . atorvastatin  40 mg Oral Daily  . digoxin  0.125 mg Oral Daily  . DULoxetine  60 mg Oral Daily  . empagliflozin  25 mg Oral Daily  . enoxaparin (LOVENOX) injection  40 mg Subcutaneous Q24H  . fluticasone furoate-vilanterol  1 puff Inhalation Daily  . furosemide  80 mg Intravenous BID  . insulin aspart  0-15 Units Subcutaneous TID WC  . insulin aspart  4 Units Subcutaneous TID WC  . insulin glargine  12 Units Subcutaneous Daily  . isosorbide dinitrate  40 mg Oral TID  . pantoprazole  40 mg Oral Daily  . pregabalin  150 mg Oral TID  . sacubitril-valsartan  1 tablet Oral BID  . sodium chloride flush  3 mL Intravenous Q12H  . spironolactone  25 mg Oral Daily   Continuous Infusions:  PRN Meds: acetaminophen **OR** acetaminophen, albuterol, bisacodyl, polyethylene glycol   Vital Signs    Vitals:   10/20/20 2006 10/21/20 0344 10/21/20 0830 10/21/20 0923  BP: 108/85 106/74 112/67   Pulse: 77 76 73   Resp: 18 18 18    Temp: 98.2 F (36.8 C) 97.8 F (36.6 C) 97.9 F (36.6 C)   TempSrc: Oral Oral Oral   SpO2: 94% 94% 93% 92%  Weight:  120.8 kg    Height:        Intake/Output Summary (Last 24 hours) at 10/21/2020 1050 Last data filed at 10/21/2020 10/23/2020 Gross per 24 hour  Intake 963 ml  Output 2201 ml  Net -1238 ml   Last 3 Weights 10/21/2020 10/20/2020 10/19/2020  Weight (lbs) 266 lb 6.4 oz 268 lb 9.6 oz 271 lb 14.4 oz  Weight (kg) 120.838 kg 121.836 kg 123.333 kg      Telemetry    Sinus rhythm first-degree AV block occasional PVCs- Personally Reviewed   Physical Exam   GEN: No acute distress.   Neck: No JVD Cardiac: RRR, no murmurs, rubs, or gallops.  Respiratory: Clear to  auscultation bilaterally. GI: Soft, nontender, non-distended  MS:  Trace lower extremity edema; No deformity. Neuro:  Nonfocal  Psych: Normal affect   Labs    High Sensitivity Troponin:   Recent Labs  Lab 10/16/20 1809 10/16/20 2100  TROPONINIHS 69* 74*      Chemistry Recent Labs  Lab 10/16/20 1809 10/17/20 0017 10/18/20 0900 10/19/20 0400 10/20/20 0616 10/21/20 0321  NA 140 140   < > 134* 136 135  K 4.1 4.6   < > 3.6 4.1 3.9  CL 92* 92*   < > 88* 90* 94*  CO2 39* 40*   < > 35* 36* 32  GLUCOSE 179* 113*   < > 118* 214* 204*  BUN 29* 28*   < > 32* 32* 32*  CREATININE 1.67* 1.74*   < > 1.35* 1.34* 1.31*  CALCIUM 8.8* 8.6*   < > 8.9 9.1 9.0  PROT 7.4 6.7  --   --   --   --   ALBUMIN 3.5 3.2*  --   --   --   --   AST 32 33  --   --   --   --   ALT 65* 51*  --   --   --   --  ALKPHOS 80 72  --   --   --   --   BILITOT 1.1 1.2  --   --   --   --   GFRNONAA 47* 45*   < > >60 >60 >60  ANIONGAP 9 8   < > 11 10 9    < > = values in this interval not displayed.     Hematology Recent Labs  Lab 10/19/20 0400 10/20/20 0616 10/21/20 0321  WBC 4.6 4.8 3.9*  RBC 6.03* 6.28* 6.36*  HGB 17.5* 18.3* 18.4*  HCT 57.8* 60.0* 59.6*  MCV 95.9 95.5 93.7  MCH 29.0 29.1 28.9  MCHC 30.3 30.5 30.9  RDW 18.4* 18.3* 18.6*  PLT 225 229 216    BNP Recent Labs  Lab 10/16/20 1810  BNP 329.7*     DDimer No results for input(s): DDIMER in the last 168 hours.   Radiology    No results found.  Cardiac Studies   EF 20 to 25%  Patient Profile     60 y.o. male here with acute on chronic systolic heart failure, ICD 2018, PCI to RCA 2019, COPD due to occupational exposure  Assessment & Plan    Acute on chronic systolic heart failure - Has been on IV Lasix 80 mg twice daily.  We will go ahead and transition him to torsemide 40 p.o. twice daily.  Previously was taking 40 mg once a day. - Continue with Entresto digoxin eplerenone -Has had trouble with bisoprolol, we will  continue to hold. -Also hydralazine has been on hold because of soft blood pressures.  Likely does not need at discharge.  Coronary artery disease - PCI DES to RCA in 2019 - Overall doing well.  "Flat troponin trend compatible with demand ischemia in the setting of heart failure. - Continue with aspirin and statin.  Diabetes with hypertension and hyperlipidemia - Continue with atorvastatin, good blood pressure control per medications above.  He is also on SGLT2 inhibitor Jardiance.  CKD stage IIIa - Creatinine in the 1.3 range.  Doing well.  COPD - Never smoked but had workplace exposure.  He is on home O2 as needed.  Ok for DC, continue follow-up with Dr. 2020      For questions or updates, please contact [facility] Please consult [email or web address] for contact info under        Signed, Shirlee Latch, MD  10/21/2020, 10:50 AM

## 2020-10-21 NOTE — Progress Notes (Signed)
D/C instructions given and reviewed. Tele and IV removed, tolerated well. Awaiting DME delivery. 

## 2020-10-21 NOTE — Plan of Care (Signed)

## 2020-10-21 NOTE — Discharge Summary (Signed)
Physician Discharge Summary  Richard [name] XLK:[id number] DOB: March 02, 1961 DOA: 10/16/2020  PCP: Ananias Pilgrim, MD  Admit date: 10/16/2020 Discharge date: 10/21/2020  Admitted From: Home Disposition: Home  Recommendations for Outpatient Follow-up:  1. Follow up with PCP in 1-2 weeks 2. Please obtain BMP/CBC in one week 3. Heart failure clinic will schedule follow-up  Home Health: Not needed Equipment/Devices: Not needed  Discharge Condition: Stable CODE STATUS: Full code Diet recommendation: Low-salt, low-carb diet.  Fluid restriction 1200 mL in 24 hours.  Discharge summary:  60 year old gentleman with history of severe systolic congestive heart failure with known ejection fraction 20 to 25%, status post AICD, obstructive sleep apnea not using CPAP, COPD on home oxygen 2 L, CKD stage II, diabetes, GERD, coronary artery disease, anxiety and gout who follows up at advanced heart failure clinic brought to the hospital for admission and IV diuresis by heart failure clinic due to increasing edema of the legs, progressive weight gain despite home dialysis therapy.  In the emergency room hemodynamically stable.  BNP 329.  Chest x-ray with interstitial edema and left lower lobe nodule.  Admitted with IV diuresis  Admitted and treated with aggressive diuresis with excellent results.  Clinically improved.  More than 10 L negative balance. Currently on room air.  Mobility improved. Discharging home with Entresto, digoxin, a plane around, Jardiance, torsemide 40 mg twice a day with increased dose.  Follow-up at the heart failure clinic. Unable to tolerate beta-blockers.  Renal functions are stable.  Diabetes stable, uses U400 at home along with Jardiance that he wants to continue.  Stable for discharge with plan to follow-up at heart failure clinic.  Discharge Diagnoses:  Principal Problem:   Acute on chronic systolic CHF (congestive heart failure) (HCC) Active Problems:   COPD (chronic  obstructive pulmonary disease) (HCC)   Hypertension   CKD (chronic kidney disease)   GERD (gastroesophageal reflux disease)   Diabetes mellitus with diabetic neuropathy, with long-term current use of insulin (HCC)   CHF exacerbation [facility])    Discharge Instructions  Discharge Instructions    (HEART FAILURE PATIENTS) Call MD:  Anytime you have any of the following symptoms: 1) 3 pound weight gain in 24 hours or 5 pounds in 1 week 2) shortness of breath, with or without a dry hacking cough 3) swelling in the hands, feet or stomach 4) if you have to sleep on extra pillows at night in order to breathe.   Complete by: As directed    AMB referral to CHF clinic   Complete by: As directed    Avoid straining   Complete by: As directed    Call MD for:  difficulty breathing, headache or visual disturbances   Complete by: As directed    Call MD for:  extreme fatigue   Complete by: As directed    Call MD for:  persistant dizziness or light-headedness   Complete by: As directed    Diet - low sodium heart healthy   Complete by: As directed    Discharge instructions   Complete by: As directed    Follow up heart failure clinic closely -next week Record daily BP , weights   Discharge wound care:   Complete by: As directed    Wash with soap and water and keep it clean and covered   Heart Failure patients record your daily weight using the same scale at the same time of day   Complete by: As directed    Increase activity slowly   Complete  by: As directed    Other Restrictions   Complete by: As directed    Continuous home 02 2lits and Tedhose during day time   STOP any activity that causes chest pain, shortness of breath, dizziness, sweating, or exessive weakness   Complete by: As directed      Allergies as of 10/21/2020      Reactions   Codeine    Coconut Oil Itching      Medication List    STOP taking these medications   bisoprolol 5 MG tablet Commonly known as: ZEBETA   SSD 1 %  cream Generic drug: silver sulfADIAZINE     TAKE these medications   acetaminophen 650 MG CR tablet Commonly known as: TYLENOL Take 650 mg by mouth every 8 (eight) hours as needed for pain.   albuterol 108 (90 Base) MCG/ACT inhaler Commonly known as: VENTOLIN HFA Inhale 1-2 puffs into the lungs every 4 (four) hours as needed for wheezing or shortness of breath.   allopurinol 100 MG tablet Commonly known as: ZYLOPRIM Take 1 tablet (100 mg total) by mouth daily.   aspirin 81 MG chewable tablet Chew 81 mg by mouth daily. What changed: Another medication with the same name was removed. Continue taking this medication, and follow the directions you see here.   atorvastatin 40 MG tablet Commonly known as: LIPITOR TAKE 1 TABLET(40 MG) BY MOUTH DAILY AT 6 PM   digoxin 0.125 MG tablet Commonly known as: LANOXIN TAKE 1 TABLET(0.125 MG) BY MOUTH DAILY   docusate sodium 100 MG capsule Commonly known as: COLACE Take 100 mg by mouth daily as needed for mild constipation.   DULoxetine 60 MG capsule Commonly known as: CYMBALTA Take 60 mg by mouth daily.   eplerenone 50 MG tablet Commonly known as: INSPRA Take 1 tablet (50 mg total) by mouth daily.   hydrALAZINE 50 MG tablet Commonly known as: APRESOLINE Take 1.5 tablets (75 mg total) by mouth 3 (three) times daily.   icosapent Ethyl 1 g capsule Commonly known as: Vascepa Take 2 capsules (2 g total) by mouth 2 (two) times daily.   Insulin Pen Needle 30G X 8 MM Misc Commonly known as: NOVOFINE Inject 10 each into the skin as needed.   insulin regular human CONCENTRATED 500 UNIT/ML kwikpen Commonly known as: HUMULIN R Inject 0-400 Units into the skin as directed. Sliding   isosorbide dinitrate 20 MG tablet Commonly known as: ISORDIL Take 2 tablets (40 mg total) by mouth 3 (three) times daily.   Jardiance 25 MG Tabs tablet Generic drug: empagliflozin Take 25 mg by mouth daily.   MAGnesium-Oxide 400 (241.3 Mg) MG  tablet Generic drug: magnesium oxide Take 1 tablet (400 mg total) by mouth daily.   omeprazole 20 MG capsule Commonly known as: PRILOSEC TAKE ONE CAPSULE BY MOUTH DAILY   pregabalin 150 MG capsule Commonly known as: LYRICA Take 150 mg by mouth 3 (three) times daily. What changed: Another medication with the same name was removed. Continue taking this medication, and follow the directions you see here.   sacubitril-valsartan 97-103 MG Commonly known as: ENTRESTO Take 1 tablet by mouth 2 (two) times daily.   Torsemide 40 MG Tabs Take 40 mg by mouth 2 (two) times daily for 15 days. Take 1/2 the dose (20mg  ) if blood pressure systolic (top number) <100 What changed:   medication strength  when to take this  additional instructions            Discharge Care  Instructions  (From admission, onward)         Start     Ordered   10/21/20 0000  Discharge wound care:       Comments: Wash with soap and water and keep it clean and covered   10/21/20 1112          Follow-up Information    Asres, Alehegn, MD Follow up in 2 week(s).   Specialty: Family Medicine Contact information: [address] [place] [address] [phone]        Laurey MoraleMcLean, Dalton S, MD .   Specialty: Cardiology Contact information: [address] [place] [address] [phone]              Allergies  Allergen Reactions  . Codeine   . Coconut Oil Itching    Consultations:  Cardiology   Procedures/Studies: DG Chest 2 View  Result Date: 10/16/2020 CLINICAL DATA:  Shortness of breath with lower extremity edema. EXAM: CHEST - 2 VIEW COMPARISON:  Radiograph yesterday. FINDINGS: Left-sided pacemaker in place. Cardiomegaly is again seen, not significantly changed from yesterday allowing for differences in technique. Worsening interstitial opacities suspicious for pulmonary edema. No significant pleural effusion. Left basilar pulmonary nodule is not well seen on the  current exam, obscured by overlapping soft tissue structures. No pneumothorax. Stable osseous structures. IMPRESSION: 1. Worsening interstitial opacities since yesterday suspicious for pulmonary edema. Cardiomegaly not significantly changed. 2. Left lower lobe pulmonary nodule is not well seen on the current exam. Electronically Signed   By: Narda RutherfordMelanie  Sanford M.D.   On: 10/16/2020 19:02    (Echo, Carotid, EGD, Colonoscopy, ERCP)    Subjective: Patient seen and examined.  He made me on the hallway asking to go home.   Discharge Exam: Vitals:   10/21/20 0830 10/21/20 0923  BP: 112/67   Pulse: 73   Resp: 18   Temp: 97.9 F (36.6 C)   SpO2: 93% 92%   Vitals:   10/20/20 2006 10/21/20 0344 10/21/20 0830 10/21/20 0923  BP: 108/85 106/74 112/67   Pulse: 77 76 73   Resp: 18 18 18    Temp: 98.2 F (36.8 C) 97.8 F (36.6 C) 97.9 F (36.6 C)   TempSrc: Oral Oral Oral   SpO2: 94% 94% 93% 92%  Weight:  120.8 kg    Height:        General: Pt is alert, awake, not in acute distress Walking around in the hallway on room air. Cardiovascular: RRR, S1/S2 +, AICD present left precordium. 2+ peripheral edema. Respiratory: CTA bilaterally, no wheezing, no rhonchi Abdominal: Soft, NT, ND, bowel sounds + Extremities: 2+ bilateral pedal edema, chronic venous stasis changes and some chronic swelling.  No cyanosis    The results of significant diagnostics from this hospitalization (including imaging, microbiology, ancillary and laboratory) are listed below for reference.     Microbiology: Recent Results (from the past 240 hour(s))  Resp Panel by RT-PCR (Flu A&B, Covid) Nasopharyngeal Swab     Status: None   Collection Time: 10/16/20  7:38 PM   Specimen: Nasopharyngeal Swab; Nasopharyngeal(NP) swabs in vial transport medium  Result Value Ref Range Status   SARS Coronavirus 2 by RT PCR NEGATIVE NEGATIVE Final    Comment: (NOTE) SARS-CoV-2 target nucleic acids are NOT DETECTED.  The SARS-CoV-2  RNA is generally detectable in upper respiratory specimens during the acute phase of infection. The lowest concentration of SARS-CoV-2 viral copies this assay can detect is 138 copies/mL. A negative result does  not preclude SARS-Cov-2 infection and should not be used as the sole basis for treatment or other patient management decisions. A negative result may occur with  improper specimen collection/handling, submission of specimen other than nasopharyngeal swab, presence of viral mutation(s) within the areas targeted by this assay, and inadequate number of viral copies(<138 copies/mL). A negative result must be combined with clinical observations, patient history, and epidemiological information. The expected result is Negative.  Fact Sheet for Patients:  BloggerCourse.com  Fact Sheet for Healthcare Providers:  SeriousBroker.it  This test is no t yet approved or cleared by the [place] FDA and  has been authorized for detection and/or diagnosis of SARS-CoV-2 by FDA under an Emergency Use Authorization (EUA). This EUA will remain  in effect (meaning this test can be used) for the duration of the COVID-19 declaration under Section 564(b)(1) of the Act, 21 U.S.C.section 360bbb-3(b)(1), unless the authorization is terminated  or revoked sooner.       Influenza A by PCR NEGATIVE NEGATIVE Final   Influenza B by PCR NEGATIVE NEGATIVE Final    Comment: (NOTE) The Xpert Xpress SARS-CoV-2/FLU/RSV plus assay is intended as an aid in the diagnosis of influenza from Nasopharyngeal swab specimens and should not be used as a sole basis for treatment. Nasal washings and aspirates are unacceptable for Xpert Xpress SARS-CoV-2/FLU/RSV testing.  Fact Sheet for Patients: BloggerCourse.com  Fact Sheet for Healthcare Providers: SeriousBroker.it  This test is not yet approved or cleared by the  [place] FDA and has been authorized for detection and/or diagnosis of SARS-CoV-2 by FDA under an Emergency Use Authorization (EUA). This EUA will remain in effect (meaning this test can be used) for the duration of the COVID-19 declaration under Section 564(b)(1) of the Act, 21 U.S.C. section 360bbb-3(b)(1), unless the authorization is terminated or revoked.  Performed at [facility], [address], [place], [place] [address]      Labs: BNP (last 3 results) Recent Labs    10/16/20 1810  BNP 329.7*   Basic Metabolic Panel: Recent Labs  Lab 10/17/20 0017 10/18/20 0900 10/19/20 0400 10/20/20 0616 10/21/20 0321  NA 140 138 134* 136 135  K 4.6 4.6 3.6 4.1 3.9  CL 92* 91* 88* 90* 94*  CO2 40* 38* 35* 36* 32  GLUCOSE 113* 231* 118* 214* 204*  BUN 28* 33* 32* 32* 32*  CREATININE 1.74* 1.59* 1.35* 1.34* 1.31*  CALCIUM 8.6* 9.2 8.9 9.1 9.0  MG 2.2 2.2  --   --   --   PHOS  --  3.8  --   --   --    Liver Function Tests: Recent Labs  Lab 10/16/20 1809 10/17/20 0017  AST 32 33  ALT 65* 51*  ALKPHOS 80 72  BILITOT 1.1 1.2  PROT 7.4 6.7  ALBUMIN 3.5 3.2*   No results for input(s): LIPASE, AMYLASE in the last 168 hours. No results for input(s): AMMONIA in the last 168 hours. CBC: Recent Labs  Lab 10/16/20 1809 10/17/20 0017 10/18/20 0900 10/19/20 0400 10/20/20 0616 10/21/20 0321  WBC 6.3 5.7 5.1 4.6 4.8 3.9*  NEUTROABS 3.2  --  3.1 2.3 2.5 1.7  HGB 18.1* 17.9* 18.2* 17.5* 18.3* 18.4*  HCT 60.1* 59.6* 61.0* 57.8* 60.0* 59.6*  MCV 97.7 97.7 98.2 95.9 95.5 93.7  PLT 270 229 229 225 229 216   Cardiac Enzymes: No results for input(s): CKTOTAL, CKMB, CKMBINDEX, TROPONINI in the last 168 hours. BNP: Invalid input(s): POCBNP CBG: Recent Labs  Lab 10/20/20 0623 10/20/20 1139 10/20/20 1604 10/20/20 2128 10/21/20 0554  GLUCAP 249* 211* 190* 223* 285*   D-Dimer No results for input(s): DDIMER in the last 72 hours. Hgb A1c No results for  input(s): HGBA1C in the last 72 hours. Lipid Profile No results for input(s): CHOL, HDL, LDLCALC, TRIG, CHOLHDL, LDLDIRECT in the last 72 hours. Thyroid function studies No results for input(s): TSH, T4TOTAL, T3FREE, THYROIDAB in the last 72 hours.  Invalid input(s): FREET3 Anemia work up No results for input(s): VITAMINB12, FOLATE, FERRITIN, TIBC, IRON, RETICCTPCT in the last 72 hours. Urinalysis    Component Value Date/Time   COLORURINE AMBER (A) 04/22/2019 1359   APPEARANCEUR CLEAR 04/22/2019 1359   LABSPEC >1.030 (H) 04/22/2019 1359   PHURINE 5.5 04/22/2019 1359   GLUCOSEU 100 (A) 04/22/2019 1359   HGBUR NEGATIVE 04/22/2019 1359   BILIRUBINUR SMALL (A) 04/22/2019 1359   KETONESUR NEGATIVE 04/22/2019 1359   PROTEINUR 100 (A) 04/22/2019 1359   NITRITE NEGATIVE 04/22/2019 1359   LEUKOCYTESUR NEGATIVE 04/22/2019 1359   Sepsis Labs Invalid input(s): PROCALCITONIN,  WBC,  LACTICIDVEN Microbiology Recent Results (from the past 240 hour(s))  Resp Panel by RT-PCR (Flu A&B, Covid) Nasopharyngeal Swab     Status: None   Collection Time: 10/16/20  7:38 PM   Specimen: Nasopharyngeal Swab; Nasopharyngeal(NP) swabs in vial transport medium  Result Value Ref Range Status   SARS Coronavirus 2 by RT PCR NEGATIVE NEGATIVE Final    Comment: (NOTE) SARS-CoV-2 target nucleic acids are NOT DETECTED.  The SARS-CoV-2 RNA is generally detectable in upper respiratory specimens during the acute phase of infection. The lowest concentration of SARS-CoV-2 viral copies this assay can detect is 138 copies/mL. A negative result does not preclude SARS-Cov-2 infection and should not be used as the sole basis for treatment or other patient management decisions. A negative result may occur with  improper specimen collection/handling, submission of specimen other than nasopharyngeal swab, presence of viral mutation(s) within the areas targeted by this assay, and inadequate number of viral copies(<138  copies/mL). A negative result must be combined with clinical observations, patient history, and epidemiological information. The expected result is Negative.  Fact Sheet for Patients:  BloggerCourse.com  Fact Sheet for Healthcare Providers:  SeriousBroker.it  This test is no t yet approved or cleared by the [place] FDA and  has been authorized for detection and/or diagnosis of SARS-CoV-2 by FDA under an Emergency Use Authorization (EUA). This EUA will remain  in effect (meaning this test can be used) for the duration of the COVID-19 declaration under Section 564(b)(1) of the Act, 21 U.S.C.section 360bbb-3(b)(1), unless the authorization is terminated  or revoked sooner.       Influenza A by PCR NEGATIVE NEGATIVE Final   Influenza B by PCR NEGATIVE NEGATIVE Final    Comment: (NOTE) The Xpert Xpress SARS-CoV-2/FLU/RSV plus assay is intended as an aid in the diagnosis of influenza from Nasopharyngeal swab specimens and should not be used as a sole basis for treatment. Nasal washings and aspirates are unacceptable for Xpert Xpress SARS-CoV-2/FLU/RSV testing.  Fact Sheet for Patients: BloggerCourse.com  Fact Sheet for Healthcare Providers: SeriousBroker.it  This test is not yet approved or cleared by the [place] FDA and has been authorized for detection and/or diagnosis of SARS-CoV-2 by FDA under an Emergency Use Authorization (EUA). This EUA will remain in effect (meaning this test can be used) for the duration of the COVID-19 declaration under Section 564(b)(1) of the Act, 21 U.S.C. section 360bbb-3(b)(1),  unless the authorization is terminated or revoked.  Performed at [facility], [address] [place], [place] [address]      Time coordinating discharge:  28 minutes  SIGNED:   Dorcas Carrow, MD  [facility] 10/21/2020, 11:13 AM

## 2020-11-09 ENCOUNTER — Other Ambulatory Visit: Payer: Self-pay

## 2020-11-09 ENCOUNTER — Ambulatory Visit (HOSPITAL_COMMUNITY)
Admission: RE | Admit: 2020-11-09 | Discharge: 2020-11-09 | Disposition: A | Discharge status: Home / Self Care | Payer: Medicare Other | Source: Ambulatory Visit | Attending: Cardiology | Admitting: Cardiology

## 2020-11-09 ENCOUNTER — Encounter (HOSPITAL_COMMUNITY): Payer: Self-pay | Admitting: Cardiology

## 2020-11-09 VITALS — BP 104/60 | HR 81 | Wt 276.6 lb

## 2020-11-09 DIAGNOSIS — Z794 Long term (current) use of insulin: Secondary | ICD-10-CM | POA: Insufficient documentation

## 2020-11-09 DIAGNOSIS — E781 Pure hyperglyceridemia: Secondary | ICD-10-CM | POA: Insufficient documentation

## 2020-11-09 DIAGNOSIS — E785 Hyperlipidemia, unspecified: Secondary | ICD-10-CM | POA: Diagnosis not present

## 2020-11-09 DIAGNOSIS — M7989 Other specified soft tissue disorders: Secondary | ICD-10-CM | POA: Insufficient documentation

## 2020-11-09 DIAGNOSIS — I130 Hypertensive heart and chronic kidney disease with heart failure and stage 1 through stage 4 chronic kidney disease, or unspecified chronic kidney disease: Secondary | ICD-10-CM | POA: Diagnosis not present

## 2020-11-09 DIAGNOSIS — I2510 Atherosclerotic heart disease of native coronary artery without angina pectoris: Secondary | ICD-10-CM | POA: Diagnosis not present

## 2020-11-09 DIAGNOSIS — Z7984 Long term (current) use of oral hypoglycemic drugs: Secondary | ICD-10-CM | POA: Diagnosis not present

## 2020-11-09 DIAGNOSIS — R0602 Shortness of breath: Secondary | ICD-10-CM | POA: Diagnosis present

## 2020-11-09 DIAGNOSIS — Z7982 Long term (current) use of aspirin: Secondary | ICD-10-CM | POA: Insufficient documentation

## 2020-11-09 DIAGNOSIS — Z8249 Family history of ischemic heart disease and other diseases of the circulatory system: Secondary | ICD-10-CM | POA: Diagnosis not present

## 2020-11-09 DIAGNOSIS — E1122 Type 2 diabetes mellitus with diabetic chronic kidney disease: Secondary | ICD-10-CM | POA: Insufficient documentation

## 2020-11-09 DIAGNOSIS — Z79899 Other long term (current) drug therapy: Secondary | ICD-10-CM | POA: Insufficient documentation

## 2020-11-09 DIAGNOSIS — D751 Secondary polycythemia: Secondary | ICD-10-CM | POA: Diagnosis not present

## 2020-11-09 DIAGNOSIS — J449 Chronic obstructive pulmonary disease, unspecified: Secondary | ICD-10-CM | POA: Diagnosis not present

## 2020-11-09 DIAGNOSIS — Z95810 Presence of automatic (implantable) cardiac defibrillator: Secondary | ICD-10-CM | POA: Diagnosis not present

## 2020-11-09 DIAGNOSIS — G4733 Obstructive sleep apnea (adult) (pediatric): Secondary | ICD-10-CM | POA: Diagnosis not present

## 2020-11-09 DIAGNOSIS — I428 Other cardiomyopathies: Secondary | ICD-10-CM | POA: Diagnosis not present

## 2020-11-09 DIAGNOSIS — I5042 Chronic combined systolic (congestive) and diastolic (congestive) heart failure: Secondary | ICD-10-CM | POA: Insufficient documentation

## 2020-11-09 DIAGNOSIS — Z955 Presence of coronary angioplasty implant and graft: Secondary | ICD-10-CM | POA: Diagnosis not present

## 2020-11-09 DIAGNOSIS — Z87891 Personal history of nicotine dependence: Secondary | ICD-10-CM | POA: Diagnosis not present

## 2020-11-09 DIAGNOSIS — N183 Chronic kidney disease, stage 3 unspecified: Secondary | ICD-10-CM | POA: Diagnosis not present

## 2020-11-09 LAB — BASIC METABOLIC PANEL
Anion gap: 8 (ref 5–15)
BUN: 28 mg/dL — ABNORMAL HIGH (ref 6–20)
CO2: 32 mmol/L (ref 22–32)
Calcium: 9.3 mg/dL (ref 8.9–10.3)
Chloride: 98 mmol/L (ref 98–111)
Creatinine, Ser: 1.54 mg/dL — ABNORMAL HIGH (ref 0.61–1.24)
GFR, Estimated: 52 mL/min — ABNORMAL LOW (ref >60)
Glucose, Bld: 124 mg/dL — ABNORMAL HIGH (ref 70–99)
Potassium: 4.2 mmol/L (ref 3.5–5.1)
Sodium: 138 mmol/L (ref 135–145)

## 2020-11-09 LAB — BRAIN NATRIURETIC PEPTIDE: B Natriuretic Peptide: 373.4 pg/mL — ABNORMAL HIGH (ref 0.0–100.0)

## 2020-11-09 MED ORDER — TORSEMIDE 40 MG PO TABS: 60.0000 mg | ORAL_TABLET | Freq: Two times a day (BID) | ORAL | 3 refills | Status: DC

## 2020-11-09 NOTE — Patient Instructions (Signed)
Increase Torsemide to 60 mg (3 tabs) Twice daily   Labs done today, we will call you for any abnormal results  Your physician recommends that you return for lab work in: 10-14 days  You have been referred to our [facility]  Your physician recommends that you schedule a follow-up appointment in: 1 month  If you have any questions or concerns before your next appointment please send [place] a message through Queets or call our office at [phone].    TO LEAVE A MESSAGE FOR THE NURSE SELECT OPTION 2, PLEASE LEAVE A MESSAGE INCLUDING: . YOUR NAME . DATE OF BIRTH . CALL BACK NUMBER . REASON FOR CALL**this is important as we prioritize the call backs  YOU WILL RECEIVE A CALL BACK THE SAME DAY AS LONG AS YOU CALL BEFORE 4:00 PM  At the [facility], you and your health needs are our priority. As part of our continuing mission to provide you with exceptional heart care, we have created designated Provider Care Teams. These Care Teams include your primary Cardiologist (physician) and Advanced Practice Providers (APPs- Physician Assistants and Nurse Practitioners) who all work together to provide you with the care you need, when you need it.   You may see any of the following providers on your designated Care Team at your next follow up: Marland Kitchen Dr Arvilla Meres . Dr Marca Ancona . Dr Thornell Mule . Tonye Becket, NP . Robbie Lis, PA . Shanda Bumps [place],NP . Karle Plumber, PharmD   Please be sure to bring in all your medications bottles to every appointment.

## 2020-11-09 NOTE — Progress Notes (Addendum)
Paramedicine Initial Assessment:  Housing:  In what kind of housing do you live? House/apt/trailer/shelter? house  Do you rent/pay a mortgage/own? rent  Do you live with anyone? Lives with ex wife [name] and some other family members  Are you currently worried about losing your housing? no  Within the past 12 months have you ever stayed outside, in a car, tent, a shelter, or temporarily with someone? no  Within the past 12 months have you been unable to get utilities when it was really needed? no  Social:  What is your current marital status? married  Do you have any children? Has 6 children  Food:  Within the past 12 months were you ever worried that food would run out before you got money to buy more? no  Within the past 66months have you run out of food and didn't have money to buy more? no  Income:  What is your current source of income? SSDI $1000/month  Insurance:  Are you currently insured? Yes medicare and medicaid  Do you have prescription coverage? yes  If no insurance, have you applied for coverage (Medicaid, disability, marketplace etc)?  Transportation:  Do you have transportation to your medical appointments? Uses [facility]   In the past 12 months has lack of transportation kept you from medical appts or from getting medications? no  In the past 12 months has lack of transportation kept you from meetings, work, or getting things you needed? no   Daily Health Needs: Do you have a working scale at home? yes  How do you manage your medications at home? Bubble packs- has been making some adjustments on his own  Do you ever take your medications differently than prescribed? no  Do you have issues affording your medications? no  If yes, has this ever prevented you from obtaining medications?  Do you have any concerns with mobility at home?  Do you use any assistive devices at home or have PCS at home? no   Do you have a PCP? Getting him  switching over to [facility]- appt set up for July  Do you have any trouble reading or writing? Can read but physically having trouble writing due to medical concerns.  Are there any additional barriers you see to getting the care you need?  CSW will continue to follow through paramedicine program and assist as needed.  Burna Sis, LCSW Clinical Social Worker [facility] Desk#: [phone] Cell#: [phone]

## 2020-11-09 NOTE — Progress Notes (Signed)
ReDS Vest / Clip - 11/09/20 1500      ReDS Vest / Clip   Station Marker D    Ruler Value 40    ReDS Value Range Low volume    ReDS Actual Value 33

## 2020-11-10 NOTE — Progress Notes (Signed)
ID:  Richard [name], DOB 04/18/1961, MRN [id number]   Provider location: [facility] Type of Visit: Established patient   PCP:  Ananias Pilgrim, MD  Cardiologist:  Dr. Shirlee Latch   History of Present Illness: Richard [name] is a 60 y.o. male who has a history of chronic systolic HF s/p Medtronic ICD (12/2016), HTN, DM, and HLD.   Admitted 5/8-5/14/19 with A/C systolic HF. Echo showed EF 15-20%. Advanced HF team was consulted. He diuresed 20 lbs with IV lasix, then transitioned to torsemide 40 mg daily. Underwent R/LHC showing a focal severe RCA stenosis treated with DES.  He did not, however, appear to have severe enough coronary disease to explain his cardiomyopathy. Cardiac index was low at 1.9.  HF meds were optimized. Beta blocker was not started due to low CI and soft BPs. He was discharged on home oxygen. Referred to cardiac rehab. DC weight: 260 lbs.   Echo in 2/20 showed EF 20-25% with severe LV dilation, moderate RV enlargement with moderately decreased RV systolic function.  CPX in 2/21 showed a severe functional limitation but it actually appeared to be primarily due to pulmonary issues.   Echo in 1/22 showed EF < 20% with severe LV dilation, normal RV size with moderately decreased systolic function, dilated IVC.   He was admitted in 4/22 with CHF.   He presents today for followup of CHF.  We are not totally sure about his medications.  He says that his PCP tried to change a number of his cardiac meds but he thinks he is taking what we had him on from prior.  His meds are in bubble packs. He says that his legs have been swelling. He is short of breath if he "rushes."  No orthopnea/PND.  No lightheadedness. Weight is stable compared to prior appointment.  No chest pain.   Labs (10/19): K 3.9, creatinine 1.18  Labs (10/20): BNP 160, K 3.8, creatinine 1.31 Labs (4/21): TGs 475, LDL 67, K 4.2, creatinine 1.8, urine and serum immunofixations negative.  Labs  (11/21): LDL 42, HDL 27, K 4.3, creatinine 0.93 Labs (5/22): K 4.5, creatinine 1.46  ECG (personally reviewed): NSR, IVCD 140 msec  PMH: 1. Chronic systolic CHF: Primarily nonischemic cardiomyopathy.  Has Medtronic ICD.   - Echo 5/19: EF 15-20%, grade 2 DD, mild MR, RV severely reduced, LA moderately dilated, RA mildly dilated, mild TR.  - RHC/LHC (5/19): 80% distal LAD stenosis, 80% proximal RCA stenosis treated with DES to pRCA. Mean RA 13, PA 69/31 mean 47, mean 24, CI 1.91, PVR 4.8 WU.  - Gynecomastia with spironolactone. - Echo (2/20): EF 20-25%, severe LV enlargement, moderate RV enlargement, moderately decreased RV systolic function.  - CPX (2/21): peak VO2 13.2, VE/VCO2 slope 24, RER 1.10. Severe functional limitation due to OHS with severe restrictive lung disease without significant HF limitation.  - PYP scan (7/21): Negative for TTR cardiac amyloidosis.  - Echo (1/22): EF < 20% with severe LV dilation, normal RV size with moderately decreased systolic function, dilated IVC.  2. COPD: Uses oxygen at night. Never smoked, but apparently had significant occupational exposure. It appears that he won a lawsuit dealing with the occupational exposure-related COPD.  3. CAD: Cath in 5/17 with 60% RCA stenosis.  - LHC (5/19): 80% distal LAD, 80% proximal RCA.  DES to proximal RCA.  4. Polycythemia: Probably due to chronic hypoxemia.  5. Type II diabetes.  6. HTN 7. GERD 8. CKD:  Stage 3.  Likely due to diabetes and HTN.  9. OSA: Unable to tolerate CPAP.   Review of systems complete and found to be negative unless listed in HPI.    Current Outpatient Medications  Medication Sig Dispense Refill  . acetaminophen (TYLENOL) 650 MG CR tablet Take 650 mg by mouth every 8 (eight) hours as needed for pain.    Marland Kitchen albuterol (VENTOLIN HFA) 108 (90 Base) MCG/ACT inhaler Inhale 1-2 puffs into the lungs every 4 (four) hours as needed for wheezing or shortness of breath. 8 g 1  . allopurinol (ZYLOPRIM)  100 MG tablet Take 1 tablet (100 mg total) by mouth daily. 30 tablet 3  . aspirin 81 MG chewable tablet Chew 81 mg by mouth daily.    Marland Kitchen atorvastatin (LIPITOR) 40 MG tablet TAKE 1 TABLET(40 MG) BY MOUTH DAILY AT 6 PM 90 tablet 3  . bisoprolol (ZEBETA) 5 MG tablet Take 2.5 mg by mouth daily.    . digoxin (LANOXIN) 0.125 MG tablet TAKE 1 TABLET(0.125 MG) BY MOUTH DAILY 30 tablet 6  . docusate sodium (COLACE) 100 MG capsule Take 100 mg by mouth daily as needed for mild constipation.    . DULoxetine (CYMBALTA) 60 MG capsule Take 60 mg by mouth daily.  2  . fenofibrate (TRICOR) 145 MG tablet Take 145 mg by mouth daily.    . hydrALAZINE (APRESOLINE) 50 MG tablet Take 1.5 tablets (75 mg total) by mouth 3 (three) times daily. 135 tablet 3  . icosapent Ethyl (VASCEPA) 1 g capsule Take 2 capsules (2 g total) by mouth 2 (two) times daily. 120 capsule 11  . Insulin Pen Needle (NOVOFINE) 30G X 8 MM MISC Inject 10 each into the skin as needed. 100 each 0  . insulin regular human CONCENTRATED (HUMULIN R) 500 UNIT/ML kwikpen Inject 0-400 Units into the skin as directed. Sliding    . isosorbide dinitrate (ISORDIL) 20 MG tablet Take 2 tablets (40 mg total) by mouth 3 (three) times daily. 180 tablet 3  . JARDIANCE 25 MG TABS tablet Take 25 mg by mouth daily. 30 tablet 6  . MAGNESIUM-OXIDE 400 (241.3 Mg) MG tablet Take 1 tablet (400 mg total) by mouth daily. 30 tablet 11  . omeprazole (PRILOSEC) 20 MG capsule TAKE ONE CAPSULE BY MOUTH DAILY 30 capsule 0  . pregabalin (LYRICA) 150 MG capsule Take 150 mg by mouth 3 (three) times daily.    . sacubitril-valsartan (ENTRESTO) 97-103 MG Take 1 tablet by mouth 2 (two) times daily. 180 tablet 3  . eplerenone (INSPRA) 50 MG tablet Take 1 tablet (50 mg total) by mouth daily. 30 tablet 6  . Torsemide 40 MG TABS Take 60 mg by mouth 2 (two) times daily. 180 tablet 3   No current facility-administered medications for this encounter.   Allergies  Allergen Reactions  . Codeine    . Coconut Oil Itching    Social History   Socioeconomic History  . Marital status: Divorced    Spouse name: Not on file  . Number of children: Not on file  . Years of education: Not on file  . Highest education level: Not on file  Occupational History  . Occupation: disability    Comment: stopped working in 2000 d/t occupational exposures  Tobacco Use  . Smoking status: Former Smoker    Types: Cigarettes  . Smokeless tobacco: Never Used  . Tobacco comment: "smoked when I drank"  Vaping Use  . Vaping Use: Never used  Substance and  Sexual Activity  . 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 Drug use: Never  . Sexual activity: Not Currently  Other Topics Concern  . Not on file  Social History Narrative   Patient lives at home with wife and some of his 10 children. He self-administers his own medications.   Social Determinants of Health   Financial Resource Strain: Low Risk   . Difficulty of Paying Living Expenses: Not very hard  Food Insecurity: No Food Insecurity  . Worried About Programme researcher, broadcasting/film/video in the Last Year: Never true  . Ran Out of Food in the Last Year: Never true  Transportation Needs: No Transportation Needs  . Lack of Transportation (Medical): No  . Lack of Transportation (Non-Medical): No  Physical Activity: Not on file  Stress: Not on file  Social Connections: Not on file  Intimate Partner Violence: Not on file   Family History  Problem Relation Age of Onset  . Hypertension Mother   . Diabetes Mother   . Hypertension Father   . Diabetes Father   . Diabetes Sister   . Diabetes Brother    Vitals:   11/09/20 1435  BP: 104/60  Pulse: 81  SpO2: 91%  Weight: 125.5 kg (276 lb 9.6 oz)     Wt Readings from Last 3 Encounters:  11/09/20 125.5 kg (276 lb 9.6 oz)  10/21/20 120.8 kg (266 lb 6.4 oz)  09/09/20 122.5 kg (270 lb)    Exam:   BP 104/60   Pulse 81   Wt 125.5 kg (276 lb 9.6 oz)    SpO2 91%   BMI 32.80 kg/m  General: NAD Neck: JVP 8-9 cm, no thyromegaly or thyroid nodule.  Lungs: Clear to auscultation bilaterally with normal respiratory effort. CV: Nondisplaced PMI.  Heart regular S1/S2, no S3/S4, no murmur.  1+ edema to knees.  No carotid bruit.  Normal pedal pulses.  Abdomen: Soft, nontender, no hepatosplenomegaly, mild distention.  Skin: Intact without lesions or rashes.  Neurologic: Alert and oriented x 3.  Psych: Normal affect. Extremities: No clubbing or cyanosis.  HEENT: Normal.   ASSESSMENT & PLAN:  1. Chronic systolic CHF:  Primarily nonischemic cardiomyopathy. Echo in 2017 with EF 15-20%. Medtronic ICD. Echo 10/2017 EF 15-20%, severely dilated RV with severely decreased systolic function. LHC/RHC 11/06/17 showed volume overload with 80% RCA stenosis.  Cardiac index low at 1.91. The degree of coronary disease does not explain his cardiomyopathy.  Echo in 2/20 showed severe LV dilation, EF 20-25%, moderately decreased RV systolic function.  PYP scan in 7/21 not suggestive of transthyretin amyloidosis. Echo in 1/22 showed EF < 20%, moderately decreased RV systolic function.  NYHA class II-III symptoms.  He looks volume overloaded on exam. - Increase torsemide to 60 mg bid, BMET today and in 10 days.   - Continue entresto 97/103 mg BID.  - Continue eplerenone 50 mg daily.   - Continue hydralazine 75 mg tid and isordil 40 mg tid.  - Continue digoxin 0.125 mg daily, check level today.  - Continue bisoprolol 2.5 daily.  - Continue empagliflozin.  - We are not totally sure that the above regimen is what he is actually taking.  Will have paramedicine start following him again to help with meds.  I also asked him to check with me before changes are made to his cardiac medication regimen.  2. COPD: Never smoked, but apparently had significant occupational exposure. It  appears that he won a lawsuit dealing with the occupational exposure-related COPD. CPX in 2/21 showed  severe functional limitation, but it appeared to be due to pulmonary abnormalities rather than HF.  - Followup with pulmonary.  3. CAD:  LHC in 5/19 with 80-90% proximal RCA stenosis treated with DES to RCA.  This did not cause his cardiomyopathy but is a large RCA.  No chest pain.  - Continue ASA 81 daily.  - Continue atorvastatin, good LDL in 11/21.  4. Polycythemia: Likely related to chronic hypoxia.  5. OSA: Cannot tolerate CPAP, wears oxygen at night.  6. DM: He is on empagliflozin.   7. Hypertriglyceridemia: Off Vascepa due to blood in stool that resolved.  - Continue fenofibrate 145 mg daily.   Resume paramedicine and followup with APP in 3 wks.    Signed, Marca Ancona, MD  11/10/2020  [facility] [address] [facility] [place] [address] [phone] (office) [phone] (fax)

## 2020-11-15 ENCOUNTER — Other Ambulatory Visit (HOSPITAL_COMMUNITY): Payer: Self-pay | Admitting: Cardiology

## 2020-11-19 ENCOUNTER — Other Ambulatory Visit: Payer: Self-pay

## 2020-11-19 ENCOUNTER — Telehealth (HOSPITAL_COMMUNITY): Payer: Self-pay

## 2020-11-19 ENCOUNTER — Ambulatory Visit (HOSPITAL_COMMUNITY)
Admission: RE | Admit: 2020-11-19 | Discharge: 2020-11-19 | Disposition: A | Discharge status: Home / Self Care | Payer: Medicare Other | Source: Ambulatory Visit | Attending: Cardiology | Admitting: Cardiology

## 2020-11-19 DIAGNOSIS — I5042 Chronic combined systolic (congestive) and diastolic (congestive) heart failure: Secondary | ICD-10-CM | POA: Diagnosis present

## 2020-11-19 LAB — BASIC METABOLIC PANEL
Anion gap: 7 (ref 5–15)
BUN: 32 mg/dL — ABNORMAL HIGH (ref 6–20)
CO2: 38 mmol/L — ABNORMAL HIGH (ref 22–32)
Calcium: 9.7 mg/dL (ref 8.9–10.3)
Chloride: 98 mmol/L (ref 98–111)
Creatinine, Ser: 2 mg/dL — ABNORMAL HIGH (ref 0.61–1.24)
GFR, Estimated: 38 mL/min — ABNORMAL LOW (ref >60)
Glucose, Bld: 88 mg/dL (ref 70–99)
Potassium: 4.5 mmol/L (ref 3.5–5.1)
Sodium: 143 mmol/L (ref 135–145)

## 2020-11-19 NOTE — Telephone Encounter (Signed)
Spoke to [place]uss[place] who agreed on home visit sometime next week, we will speak on Monday to confirm a date and time.

## 2020-11-24 ENCOUNTER — Telehealth (HOSPITAL_COMMUNITY): Payer: Self-pay

## 2020-11-24 DIAGNOSIS — I5042 Chronic combined systolic (congestive) and diastolic (congestive) heart failure: Secondary | ICD-10-CM

## 2020-11-24 NOTE — Telephone Encounter (Signed)
Pt aware of results. Pt will come in on Friday 6/3 for repeat labs

## 2020-11-24 NOTE — Telephone Encounter (Signed)
-----   Message from Laurey Morale, MD sent at 11/21/2020 10:57 PM EDT ----- Please get repeat BMET this week to recheck creatinine.

## 2020-11-24 NOTE — Telephone Encounter (Signed)
Spoke to [employer] who agreed to be at lab visit on Friday at 2:15. I will see Richard [name] in the home on Monday to follow up from lab appointment to correct any med changes. Call complete.

## 2020-11-26 ENCOUNTER — Other Ambulatory Visit (HOSPITAL_COMMUNITY): Payer: Medicare Other

## 2020-11-29 ENCOUNTER — Telehealth (HOSPITAL_COMMUNITY): Payer: Self-pay

## 2020-11-29 NOTE — Telephone Encounter (Signed)
Left message for Richard [name] to return my call to confirm appointment for today, no answer. I will continue to reach out.

## 2020-11-30 ENCOUNTER — Other Ambulatory Visit (HOSPITAL_COMMUNITY): Payer: Self-pay

## 2020-11-30 ENCOUNTER — Encounter (HOSPITAL_COMMUNITY): Payer: Self-pay

## 2020-11-30 ENCOUNTER — Other Ambulatory Visit: Payer: Self-pay

## 2020-11-30 ENCOUNTER — Ambulatory Visit (INDEPENDENT_AMBULATORY_CARE_PROVIDER_SITE_OTHER): Payer: Medicare Other

## 2020-11-30 ENCOUNTER — Ambulatory Visit (HOSPITAL_COMMUNITY)
Admission: RE | Admit: 2020-11-30 | Discharge: 2020-11-30 | Disposition: A | Discharge status: Home / Self Care | Payer: Medicare Other | Source: Ambulatory Visit | Attending: Cardiology | Admitting: Cardiology

## 2020-11-30 VITALS — BP 148/100 | HR 93 | Wt 279.2 lb

## 2020-11-30 DIAGNOSIS — I2510 Atherosclerotic heart disease of native coronary artery without angina pectoris: Secondary | ICD-10-CM | POA: Diagnosis not present

## 2020-11-30 DIAGNOSIS — Z95810 Presence of automatic (implantable) cardiac defibrillator: Secondary | ICD-10-CM | POA: Insufficient documentation

## 2020-11-30 DIAGNOSIS — N183 Chronic kidney disease, stage 3 unspecified: Secondary | ICD-10-CM | POA: Diagnosis not present

## 2020-11-30 DIAGNOSIS — I5043 Acute on chronic combined systolic (congestive) and diastolic (congestive) heart failure: Secondary | ICD-10-CM

## 2020-11-30 DIAGNOSIS — Z955 Presence of coronary angioplasty implant and graft: Secondary | ICD-10-CM | POA: Diagnosis not present

## 2020-11-30 DIAGNOSIS — I130 Hypertensive heart and chronic kidney disease with heart failure and stage 1 through stage 4 chronic kidney disease, or unspecified chronic kidney disease: Secondary | ICD-10-CM | POA: Diagnosis not present

## 2020-11-30 DIAGNOSIS — E785 Hyperlipidemia, unspecified: Secondary | ICD-10-CM | POA: Insufficient documentation

## 2020-11-30 DIAGNOSIS — Z885 Allergy status to narcotic agent status: Secondary | ICD-10-CM | POA: Insufficient documentation

## 2020-11-30 DIAGNOSIS — Z87891 Personal history of nicotine dependence: Secondary | ICD-10-CM | POA: Diagnosis not present

## 2020-11-30 DIAGNOSIS — J449 Chronic obstructive pulmonary disease, unspecified: Secondary | ICD-10-CM | POA: Diagnosis not present

## 2020-11-30 DIAGNOSIS — I428 Other cardiomyopathies: Secondary | ICD-10-CM | POA: Diagnosis not present

## 2020-11-30 DIAGNOSIS — G4733 Obstructive sleep apnea (adult) (pediatric): Secondary | ICD-10-CM | POA: Insufficient documentation

## 2020-11-30 DIAGNOSIS — Z833 Family history of diabetes mellitus: Secondary | ICD-10-CM | POA: Diagnosis not present

## 2020-11-30 DIAGNOSIS — Z7982 Long term (current) use of aspirin: Secondary | ICD-10-CM | POA: Insufficient documentation

## 2020-11-30 DIAGNOSIS — E781 Pure hyperglyceridemia: Secondary | ICD-10-CM | POA: Diagnosis not present

## 2020-11-30 DIAGNOSIS — Z8249 Family history of ischemic heart disease and other diseases of the circulatory system: Secondary | ICD-10-CM | POA: Diagnosis not present

## 2020-11-30 DIAGNOSIS — D751 Secondary polycythemia: Secondary | ICD-10-CM | POA: Diagnosis not present

## 2020-11-30 DIAGNOSIS — E1122 Type 2 diabetes mellitus with diabetic chronic kidney disease: Secondary | ICD-10-CM | POA: Diagnosis not present

## 2020-11-30 DIAGNOSIS — I5023 Acute on chronic systolic (congestive) heart failure: Secondary | ICD-10-CM | POA: Diagnosis present

## 2020-11-30 DIAGNOSIS — Z794 Long term (current) use of insulin: Secondary | ICD-10-CM | POA: Diagnosis not present

## 2020-11-30 DIAGNOSIS — Z79899 Other long term (current) drug therapy: Secondary | ICD-10-CM | POA: Diagnosis not present

## 2020-11-30 LAB — BASIC METABOLIC PANEL
Anion gap: 6 (ref 5–15)
BUN: 29 mg/dL — ABNORMAL HIGH (ref 6–20)
CO2: 31 mmol/L (ref 22–32)
Calcium: 8.9 mg/dL (ref 8.9–10.3)
Chloride: 103 mmol/L (ref 98–111)
Creatinine, Ser: 1.59 mg/dL — ABNORMAL HIGH (ref 0.61–1.24)
GFR, Estimated: 50 mL/min — ABNORMAL LOW (ref >60)
Glucose, Bld: 197 mg/dL — ABNORMAL HIGH (ref 70–99)
Potassium: 4.5 mmol/L (ref 3.5–5.1)
Sodium: 140 mmol/L (ref 135–145)

## 2020-11-30 LAB — URINALYSIS, ROUTINE W REFLEX MICROSCOPIC
Bacteria, UA: NONE SEEN
Bilirubin Urine: NEGATIVE
Glucose, UA: >=500 mg/dL — AB
Hgb urine dipstick: NEGATIVE
Ketones, ur: NEGATIVE mg/dL
Leukocytes,Ua: NEGATIVE
Nitrite: NEGATIVE
Protein, ur: NEGATIVE mg/dL
Specific Gravity, Urine: 1.013 (ref 1.005–1.030)
pH: 7 (ref 5.0–8.0)

## 2020-11-30 LAB — DIGOXIN LEVEL: Digoxin Level: 0.3 ng/mL — ABNORMAL LOW (ref 0.8–2.0)

## 2020-11-30 MED ORDER — TORSEMIDE 40 MG PO TABS: 80.0000 mg | ORAL_TABLET | Freq: Two times a day (BID) | ORAL | 3 refills | Status: DC

## 2020-11-30 MED ORDER — FUROSEMIDE 10 MG/ML IJ SOLN
80.0000 mg | Freq: Once | INTRAMUSCULAR | Status: AC
  Administered 2020-11-30: 80 mg via INTRAVENOUS

## 2020-11-30 MED ORDER — POTASSIUM CHLORIDE CRYS ER 20 MEQ PO TBCR
40.0000 meq | EXTENDED_RELEASE_TABLET | Freq: Once | ORAL | Status: AC
  Administered 2020-11-30: 40 meq via ORAL

## 2020-11-30 NOTE — Patient Instructions (Addendum)
Torsemide 80 mg twice daily for 2 days then Torsemide 80 mg in AM and 60 mg PM  RN visit on Monday with Reds Clip at 2pm  Lab work today -we will call with any abnormals Lab work on Monday 2 pm

## 2020-11-30 NOTE — Progress Notes (Addendum)
ReDS Vest / Clip - 11/30/20 1600      ReDS Vest / Clip   Station Marker D    Ruler Value 33    ReDS Value Range High volume overload    ReDS Actual Value 44

## 2020-11-30 NOTE — Progress Notes (Signed)
[facility] Note   ID:  Richard [name], [place] 02/05/1961, MRN [id number]   Provider location: [facility] Type of Visit: Established patient   PCP:  Ananias Pilgrim, MD  Cardiologist:  Dr. Shirlee Latch   History of Present Illness: Richard [name] is a 60 y.o. male who has a history of chronic systolic HF s/p Medtronic ICD (12/2016), HTN, DM, and HLD.   Admitted 5/8-5/14/19 with A/C systolic HF. Echo showed EF 15-20%. Advanced HF team was consulted. He diuresed 20 lbs with IV lasix, then transitioned to torsemide 40 mg daily. Underwent R/LHC showing a focal severe RCA stenosis treated with DES.  He did not, however, appear to have severe enough coronary disease to explain his cardiomyopathy. Cardiac index was low at 1.9.  HF meds were optimized. Beta blocker was not started due to low CI and soft BPs. He was discharged on home oxygen. Referred to cardiac rehab. DC weight: 260 lbs.   Echo in 2/20 showed EF 20-25% with severe LV dilation, moderate RV enlargement with moderately decreased RV systolic function.  CPX in 2/21 showed a severe functional limitation but it actually appeared to be primarily due to pulmonary issues.   Echo in 1/22 showed EF < 20% with severe LV dilation, normal RV size with moderately decreased systolic function, dilated IVC.   He was admitted in 4/22 with CHF.   Had post hospital f/u w/ Dr. Shirlee Latch 5/17 and reported LEE and SOB w/ exertion. ReDS clip was elevated at 40%. Torsemide was increased to 60 mg bid. All other HFs continued and same dosages.   He returns today for f/u. Here w/ paramedicine. Remains fluid overloaded and SOB. Reports full med compliance. Urinating w/ torsemide but output not profound. BP elevated 148/100. Wt only down 3 lb from previous visit. ReDs Clip still elevated at 44%.     Labs (10/19): K 3.9, creatinine 1.18  Labs (10/20): BNP 160, K 3.8, creatinine 1.31 Labs (4/21): TGs 475, LDL 67, K 4.2,  creatinine 1.8, urine and serum immunofixations negative.  Labs (11/21): LDL 42, HDL 27, K 4.3, creatinine 0.34 Labs (5/22): K 4.5, creatinine 1.46 Labs (11/19/20): SCr 2.0, 4.5   ECG: not performed    PMH: 1. Chronic systolic CHF: Primarily nonischemic cardiomyopathy.  Has Medtronic ICD.   - Echo 5/19: EF 15-20%, grade 2 DD, mild MR, RV severely reduced, LA moderately dilated, RA mildly dilated, mild TR.  - RHC/LHC (5/19): 80% distal LAD stenosis, 80% proximal RCA stenosis treated with DES to pRCA. Mean RA 13, PA 69/31 mean 47, mean 24, CI 1.91, PVR 4.8 WU.  - Gynecomastia with spironolactone. - Echo (2/20): EF 20-25%, severe LV enlargement, moderate RV enlargement, moderately decreased RV systolic function.  - CPX (2/21): peak VO2 13.2, VE/VCO2 slope 24, RER 1.10. Severe functional limitation due to OHS with severe restrictive lung disease without significant HF limitation.  - PYP scan (7/21): Negative for TTR cardiac amyloidosis.  - Echo (1/22): EF < 20% with severe LV dilation, normal RV size with moderately decreased systolic function, dilated IVC.  2. COPD: Uses oxygen at night. Never smoked, but apparently had significant occupational exposure. It appears that he won a lawsuit dealing with the occupational exposure-related COPD.  3. CAD: Cath in 5/17 with 60% RCA stenosis.  - LHC (5/19): 80% distal LAD, 80% proximal RCA.  DES to proximal RCA.  4. Polycythemia: Probably due to chronic hypoxemia.  5. Type II diabetes.  6. HTN 7. GERD 8. CKD: Stage 3.  Likely due to diabetes and HTN.  9. OSA: Unable to tolerate CPAP.   Review of systems complete and found to be negative unless listed in HPI.    Current Outpatient Medications  Medication Sig Dispense Refill  . acetaminophen (TYLENOL) 650 MG CR tablet Take 650 mg by mouth every 8 (eight) hours as needed for pain.    Marland Kitchen albuterol (VENTOLIN HFA) 108 (90 Base) MCG/ACT inhaler Inhale 1-2 puffs into the lungs every 4 (four) hours as  needed for wheezing or shortness of breath. 8 g 1  . allopurinol (ZYLOPRIM) 100 MG tablet Take 1 tablet (100 mg total) by mouth daily. 30 tablet 3  . aspirin 81 MG chewable tablet Chew 81 mg by mouth daily.    Marland Kitchen atorvastatin (LIPITOR) 40 MG tablet TAKE ONE TABLET BY MOUTH DAILY AT 6PM 90 tablet 0  . bisoprolol (ZEBETA) 5 MG tablet Take 2.5 mg by mouth daily.    . digoxin (LANOXIN) 0.125 MG tablet TAKE 1 TABLET(0.125 MG) BY MOUTH DAILY 30 tablet 6  . docusate sodium (COLACE) 100 MG capsule Take 100 mg by mouth daily as needed for mild constipation.    . DULoxetine (CYMBALTA) 60 MG capsule Take 60 mg by mouth daily.  2  . eplerenone (INSPRA) 50 MG tablet Take 1 tablet (50 mg total) by mouth daily. 30 tablet 6  . fenofibrate (TRICOR) 145 MG tablet Take 145 mg by mouth daily.    . hydrALAZINE (APRESOLINE) 50 MG tablet Take 1.5 tablets (75 mg total) by mouth 3 (three) times daily. 135 tablet 3  . icosapent Ethyl (VASCEPA) 1 g capsule Take 2 capsules (2 g total) by mouth 2 (two) times daily. 120 capsule 11  . Insulin Pen Needle (NOVOFINE) 30G X 8 MM MISC Inject 10 each into the skin as needed. 100 each 0  . insulin regular human CONCENTRATED (HUMULIN R) 500 UNIT/ML kwikpen Inject 0-400 Units into the skin as directed. Sliding    . isosorbide dinitrate (ISORDIL) 20 MG tablet Take 2 tablets (40 mg total) by mouth 3 (three) times daily. 180 tablet 3  . JARDIANCE 25 MG TABS tablet Take 25 mg by mouth daily. 30 tablet 6  . MAGNESIUM-OXIDE 400 (241.3 Mg) MG tablet Take 1 tablet (400 mg total) by mouth daily. 30 tablet 11  . omeprazole (PRILOSEC) 20 MG capsule TAKE ONE CAPSULE BY MOUTH DAILY 30 capsule 0  . pregabalin (LYRICA) 150 MG capsule Take 150 mg by mouth 3 (three) times daily.    . sacubitril-valsartan (ENTRESTO) 97-103 MG Take 1 tablet by mouth 2 (two) times daily. 180 tablet 3  . Torsemide 40 MG TABS Take 60 mg by mouth 2 (two) times daily. 180 tablet 3   No current facility-administered  medications for this encounter.   Allergies  Allergen Reactions  . Codeine   . Coconut Oil Itching    Social History   Socioeconomic History  . Marital status: Divorced    Spouse name: Not on file  . Number of children: Not on file  . Years of education: Not on file  . Highest education level: Not on file  Occupational History  . Occupation: disability    Comment: stopped working in 2000 d/t occupational exposures  Tobacco Use  . Smoking status: Former Smoker    Types: Cigarettes  . Smokeless tobacco: Never Used  . Tobacco comment: "smoked when I drank"  Vaping Use  . Vaping Use: Never  used  Substance and Sexual Activity  . 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 Drug use: Never  . Sexual activity: Not Currently  Other Topics Concern  . Not on file  Social History Narrative   Patient lives at home with wife and some of his 10 children. He self-administers his own medications.   Social Determinants of Health   Financial Resource Strain: Low Risk   . Difficulty of Paying Living Expenses: Not very hard  Food Insecurity: No Food Insecurity  . Worried About Programme researcher, broadcasting/film/video in the Last Year: Never true  . Ran Out of Food in the Last Year: Never true  Transportation Needs: No Transportation Needs  . Lack of Transportation (Medical): No  . Lack of Transportation (Non-Medical): No  Physical Activity: Not on file  Stress: Not on file  Social Connections: Not on file  Intimate Partner Violence: Not on file   Family History  Problem Relation Age of Onset  . Hypertension Mother   . Diabetes Mother   . Hypertension Father   . Diabetes Father   . Diabetes Sister   . Diabetes Brother    Vitals:   11/30/20 1507  BP: (!) 148/100  Pulse: 93  SpO2: 95%  Weight: 126.6 kg (279 lb 3.2 oz)     Wt Readings from Last 3 Encounters:  11/30/20 126.6 kg (279 lb 3.2 oz)  11/09/20 125.5 kg (276 lb 9.6 oz)  10/21/20  120.8 kg (266 lb 6.4 oz)    Exam:   BP (!) 148/100   Pulse 93   Wt 126.6 kg (279 lb 3.2 oz)   SpO2 95% Comment: 77 on admission--2 L O2 got up to 95  BMI 33.11 kg/m   PHYSICAL EXAM: ReDs Clip 44%  General:  Mildly fatigued appearing. No respiratory difficulty HEENT: normal Neck: supple. JVd 10 cm Carotids 2+ bilat; no bruits. No lymphadenopathy or thyromegaly appreciated. Cor: PMI nondisplaced. Regular rate & rhythm. No rubs, gallops or murmurs. Lungs: decreased BS at the bases bilaterally  Abdomen: distended, nontender  No hepatosplenomegaly. No bruits or masses. Good bowel sounds. Extremities: no cyanosis, clubbing, rash, 1+ bilateral LE pretibial edema edema Neuro: alert & oriented x 3, cranial nerves grossly intact. moves all 4 extremities w/o difficulty. Affect pleasant.   ASSESSMENT & PLAN:  1. Acute on Chronic systolic CHF:  Primarily nonischemic cardiomyopathy. Echo in 2017 with EF 15-20%. Medtronic ICD. Echo 10/2017 EF 15-20%, severely dilated RV with severely decreased systolic function. LHC/RHC 11/06/17 showed volume overload with 80% RCA stenosis.  Cardiac index low at 1.91. The degree of coronary disease does not explain his cardiomyopathy.  Echo in 2/20 showed severe LV dilation, EF 20-25%, moderately decreased RV systolic function.  PYP scan in 7/21 not suggestive of transthyretin amyloidosis. Echo in 1/22 showed EF < 20%, moderately decreased RV systolic function.  NYHA class III symptoms.  He is fluid overloaded. ReDs Clip 44%. Poor response to recent increase in torsemide  - Give 80 IV Lasix in clinic x 1 + 20 mEq KCl to replace PM dose of torsemide - Increase torsemide to 80 mg bid x 2 days then down to 80 qam/ 60 qpm - Continue entresto 97/103 mg BID.    - Continue eplerenone 50 mg daily.   - Continue hydralazine 75 mg tid and isordil 40 mg tid.  - Continue digoxin 0.125 mg daily, check level today.  - Continue  bisoprolol 2.5 daily.  - Continue empagliflozin.  -  BMP today and again on Monday. Will have RN repeat ReDs Clip on Monday. Paramedicine to see again at home on Tuesday. If ReDs Clip still elevated, may need another dose of IV Lasix by paramedicine.   2. COPD: Never smoked, but apparently had significant occupational exposure. It appears that he won a lawsuit dealing with the occupational exposure-related COPD. CPX in 2/21 showed severe functional limitation, but it appeared to be due to pulmonary abnormalities rather than HF.  - Followup with pulmonary.  3. CAD:  LHC in 5/19 with 80-90% proximal RCA stenosis treated with DES to RCA.  This did not cause his cardiomyopathy but is a large RCA.  - he denies CP  - Continue ASA 81 daily.  - Continue atorvastatin, good LDL in 11/21.  4. Polycythemia: Likely related to chronic hypoxia.  5. OSA: Cannot tolerate CPAP, wears oxygen at night.  6. DM: He is on empagliflozin.   7. Hypertriglyceridemia: Off Vascepa due to blood in stool that resolved.  - Continue fenofibrate 145 mg daily.   F/u on Monday w/ repeat ReDs clip w/ RN + F/u BMP. Paramedicine to conduct home visit on Tuesday. F/u w/ APP in 4 weeks.     Signed, Robbie Lis, PA-C  11/30/2020  [facility] [address] [facility] [place] [address] [phone] (office) [phone] (fax)

## 2020-11-30 NOTE — Addendum Note (Signed)
Encounter addended by: Crissie Figures, RN on: 11/30/2020 4:43 PM  Actions taken: Flowsheet accepted, Clinical Note Signed

## 2020-11-30 NOTE — Progress Notes (Signed)
Paramedicine Encounter    Patient ID: Richard [name], male    DOB: June 14, 1961, 60 y.o.   MRN: [id number]  Met with Bracken in clinic today who was seen by PA Ellen Henri.   Reds clip- 44%  IV lasix 34m given  40MEQ Potassium given  Torsemide increased 853mBID for two days then back to 80/6031m Labs today and repeat again on Monday 6/13  Paramedicine home visit on Tuesday 6/14    Bubble packs confirmed.   Missing- Evening torsemide Morning bisoprolol   -I added same in to pill box with torsemide changes and he will take the bubble packs in addition to the pill box until I go out on Tuesday. Foday understood same.   ACTION: Home visit completed

## 2020-12-01 LAB — CUP PACEART REMOTE DEVICE CHECK
Battery Remaining Longevity: 74 mo
Battery Voltage: 2.98 V
Brady Statistic AP VP Percent: 0 %
Brady Statistic AP VS Percent: 0.04 %
Brady Statistic AS VP Percent: 0.05 %
Brady Statistic AS VS Percent: 99.9 %
Brady Statistic RA Percent Paced: 0.05 %
Brady Statistic RV Percent Paced: 0.05 %
Date Time Interrogation Session: 20220607123327
HighPow Impedance: 74 Ohm
Implantable Lead Implant Date: 20180719
Implantable Lead Implant Date: 20180719
Implantable Lead Location: 753859
Implantable Lead Location: 753860
Implantable Lead Model: 5076
Implantable Pulse Generator Implant Date: 20180719
Lead Channel Impedance Value: 304 Ohm
Lead Channel Impedance Value: 418 Ohm
Lead Channel Impedance Value: 418 Ohm
Lead Channel Pacing Threshold Amplitude: 0.875 V
Lead Channel Pacing Threshold Amplitude: 1 V
Lead Channel Pacing Threshold Pulse Width: 0.4 ms
Lead Channel Pacing Threshold Pulse Width: 0.4 ms
Lead Channel Sensing Intrinsic Amplitude: 4.625 mV
Lead Channel Sensing Intrinsic Amplitude: 4.625 mV
Lead Channel Sensing Intrinsic Amplitude: 7.625 mV
Lead Channel Sensing Intrinsic Amplitude: 7.625 mV
Lead Channel Setting Pacing Amplitude: 2 V
Lead Channel Setting Pacing Amplitude: 2.25 V
Lead Channel Setting Pacing Pulse Width: 0.4 ms
Lead Channel Setting Sensing Sensitivity: 0.3 mV

## 2020-12-06 ENCOUNTER — Ambulatory Visit (HOSPITAL_COMMUNITY)
Admission: RE | Admit: 2020-12-06 | Discharge: 2020-12-06 | Disposition: A | Discharge status: Home / Self Care | Payer: Medicare Other | Source: Ambulatory Visit | Attending: Internal Medicine | Admitting: Internal Medicine

## 2020-12-06 ENCOUNTER — Other Ambulatory Visit: Payer: Self-pay

## 2020-12-06 ENCOUNTER — Ambulatory Visit (HOSPITAL_COMMUNITY): Admission: RE | Admit: 2020-12-06 | Payer: Medicare Other | Source: Ambulatory Visit

## 2020-12-06 ENCOUNTER — Telehealth (HOSPITAL_COMMUNITY): Payer: Self-pay

## 2020-12-06 DIAGNOSIS — I5042 Chronic combined systolic (congestive) and diastolic (congestive) heart failure: Secondary | ICD-10-CM | POA: Diagnosis present

## 2020-12-06 DIAGNOSIS — I5023 Acute on chronic systolic (congestive) heart failure: Secondary | ICD-10-CM

## 2020-12-06 LAB — CBC
HCT: 62.7 % — ABNORMAL HIGH (ref 39.0–52.0)
Hemoglobin: 18.7 g/dL — ABNORMAL HIGH (ref 13.0–17.0)
MCH: 29 pg (ref 26.0–34.0)
MCHC: 29.8 g/dL — ABNORMAL LOW (ref 30.0–36.0)
MCV: 97.2 fL (ref 80.0–100.0)
Platelets: 301 10*3/uL (ref 150–400)
RBC: 6.45 MIL/uL — ABNORMAL HIGH (ref 4.22–5.81)
RDW: 19.3 % — ABNORMAL HIGH (ref 11.5–15.5)
WBC: 5 10*3/uL (ref 4.0–10.5)
nRBC: 0 % (ref 0.0–0.2)

## 2020-12-06 LAB — BASIC METABOLIC PANEL
Anion gap: 10 (ref 5–15)
BUN: 35 mg/dL — ABNORMAL HIGH (ref 6–20)
CO2: 35 mmol/L — ABNORMAL HIGH (ref 22–32)
Calcium: 9.1 mg/dL (ref 8.9–10.3)
Chloride: 97 mmol/L — ABNORMAL LOW (ref 98–111)
Creatinine, Ser: 1.72 mg/dL — ABNORMAL HIGH (ref 0.61–1.24)
GFR, Estimated: 46 mL/min — ABNORMAL LOW (ref >60)
Glucose, Bld: 121 mg/dL — ABNORMAL HIGH (ref 70–99)
Potassium: 4.8 mmol/L (ref 3.5–5.1)
Sodium: 142 mmol/L (ref 135–145)

## 2020-12-06 NOTE — Telephone Encounter (Signed)
Spoke to [place] and he agreed with home visit tomorrow at 1045/1100. Call complete.

## 2020-12-06 NOTE — Progress Notes (Signed)
Based on [employer] readings per Dr. Shirlee Latch have patient increase Torsemide to 80mg  po bid. Patient advised and verbalized understanding. Med list updated to reflect changes.

## 2020-12-06 NOTE — Progress Notes (Signed)
ReDS Vest / Clip - 12/06/20 1400       ReDS Vest / Clip   Station Marker D    Ruler Value 43    ReDS Value Range Moderate volume overload    ReDS Actual Value 36    Anatomical Comments sitting

## 2020-12-07 ENCOUNTER — Other Ambulatory Visit (HOSPITAL_COMMUNITY): Payer: Self-pay | Admitting: *Deleted

## 2020-12-07 ENCOUNTER — Other Ambulatory Visit (HOSPITAL_COMMUNITY): Payer: Self-pay

## 2020-12-07 MED ORDER — TORSEMIDE 40 MG PO TABS: ORAL_TABLET | ORAL | 3 refills | Status: DC

## 2020-12-07 NOTE — Progress Notes (Signed)
Paramedicine Encounter    Patient ID: Richard [name], male    DOB: May 02, 1961, 60 y.o.   MRN: [id number]   Patient Care Team: Ananias Pilgrim, MD as PCP - General (Family Medicine) Laurey Morale, MD as PCP - Advanced Heart Failure (Cardiology) Burna Sis, LCSW as Social Worker (Licensed Clinical Social Worker)  Patient Active Problem List   Diagnosis Date Noted   CHF exacerbation (HCC) 10/17/2020   Acute on chronic systolic CHF (congestive heart failure) (HCC) 10/16/2020   Acute on chronic combined systolic and diastolic CHF (congestive heart failure) (HCC) 03/15/2018   S/P right and left heart catheterization 11/02/2017   Panic attacks 11/02/2017   Non-STEMI (non-ST elevated myocardial infarction) (HCC)    COPD (chronic obstructive pulmonary disease) (HCC)    Hypertension    CKD (chronic kidney disease)    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HCC)    ICD (implantable cardioverter-defibrillator) in place    GERD (gastroesophageal reflux disease)    Diabetes mellitus with diabetic neuropathy, with long-term current use of insulin (HCC)     Current Outpatient Medications:    acetaminophen (TYLENOL) 650 MG CR tablet, Take 650 mg by mouth every 8 (eight) hours as needed for pain., Disp: , Rfl:    albuterol (VENTOLIN HFA) 108 (90 Base) MCG/ACT inhaler, Inhale 1-2 puffs into the lungs every 4 (four) hours as needed for wheezing or shortness of breath., Disp: 8 g, Rfl: 1   allopurinol (ZYLOPRIM) 100 MG tablet, Take 1 tablet (100 mg total) by mouth daily., Disp: 30 tablet, Rfl: 3   aspirin 81 MG chewable tablet, Chew 81 mg by mouth daily., Disp: , Rfl:    atorvastatin (LIPITOR) 40 MG tablet, TAKE ONE TABLET BY MOUTH DAILY AT 6PM, Disp: 90 tablet, Rfl: 0   bisoprolol (ZEBETA) 5 MG tablet, Take 2.5 mg by mouth daily., Disp: , Rfl:    digoxin (LANOXIN) 0.125 MG tablet, TAKE 1 TABLET(0.125 MG) BY MOUTH DAILY, Disp: 30 tablet, Rfl: 6   docusate sodium (COLACE) 100 MG capsule, Take 100 mg by mouth daily  as needed for mild constipation., Disp: , Rfl:    DULoxetine (CYMBALTA) 60 MG capsule, Take 60 mg by mouth daily., Disp: , Rfl: 2   eplerenone (INSPRA) 50 MG tablet, Take 1 tablet (50 mg total) by mouth daily., Disp: 30 tablet, Rfl: 6   fenofibrate (TRICOR) 145 MG tablet, Take 145 mg by mouth daily., Disp: , Rfl:    hydrALAZINE (APRESOLINE) 50 MG tablet, Take 1.5 tablets (75 mg total) by mouth 3 (three) times daily., Disp: 135 tablet, Rfl: 3   icosapent Ethyl (VASCEPA) 1 g capsule, Take 2 capsules (2 g total) by mouth 2 (two) times daily. (Patient not taking: Reported on 11/30/2020), Disp: 120 capsule, Rfl: 11   Insulin Pen Needle (NOVOFINE) 30G X 8 MM MISC, Inject 10 each into the skin as needed., Disp: 100 each, Rfl: 0   insulin regular human CONCENTRATED (HUMULIN R) 500 UNIT/ML kwikpen, Inject 0-400 Units into the skin as directed. Sliding, Disp: , Rfl:    isosorbide dinitrate (ISORDIL) 20 MG tablet, Take 2 tablets (40 mg total) by mouth 3 (three) times daily., Disp: 180 tablet, Rfl: 3   JARDIANCE 25 MG TABS tablet, Take 25 mg by mouth daily., Disp: 30 tablet, Rfl: 6   MAGNESIUM-OXIDE 400 (241.3 Mg) MG tablet, Take 1 tablet (400 mg total) by mouth daily., Disp: 30 tablet, Rfl: 11   omeprazole (PRILOSEC) 20 MG capsule, TAKE ONE CAPSULE BY MOUTH DAILY (  Patient not taking: Reported on 11/30/2020), Disp: 30 capsule, Rfl: 0   pregabalin (LYRICA) 150 MG capsule, Take 150 mg by mouth 3 (three) times daily., Disp: , Rfl:    sacubitril-valsartan (ENTRESTO) 97-103 MG, Take 1 tablet by mouth 2 (two) times daily., Disp: 180 tablet, Rfl: 3   Torsemide 40 MG TABS, Take 80 mg by mouth 2 (two) times daily. 80 mg twice daily x 2 days beginning-- 6/8 then 80 mg in AM and 60 in PM, Disp: 180 tablet, Rfl: 3 Allergies  Allergen Reactions   Codeine    Coconut Oil Itching     Social History   Socioeconomic History   Marital status: Divorced    Spouse name: Not on file   Number of children: Not on file   Years of  education: Not on file   Highest education level: Not on file  Occupational History   Occupation: disability    Comment: stopped working in 2000 d/t occupational exposures  Tobacco Use   Smoking status: Former    Pack years: 0.00    Types: Cigarettes   Smokeless tobacco: Never   Tobacco comments:    "smoked when I drank"  Vaping Use   Vaping Use: Never used  Substance and Sexual Activity   Alcohol use: Not Currently    Comment: 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  Other Topics Concern   Not on file  Social History Narrative   Patient lives at home with wife and some of his 10 children. He self-administers his own medications.   Social Determinants of Health   Financial Resource Strain: Low Risk    Difficulty of Paying Living Expenses: Not very hard  Food Insecurity: No Food Insecurity   Worried About Programme researcher, broadcasting/film/video in the Last Year: Never true   Ran Out of Food in the Last Year: Never true  Transportation Needs: No Transportation Needs   Lack of Transportation (Medical): No   Lack of Transportation (Non-Medical): No  Physical Activity: Not on file  Stress: Not on file  Social Connections: Not on file  Intimate Partner Violence: Not on file    Physical Exam Vitals reviewed.  Constitutional:      Appearance: He is normal weight.  HENT:     Head: Normocephalic.     Nose: Nose normal.     Mouth/Throat:     Mouth: Mucous membranes are moist.     Pharynx: Oropharynx is clear.  Eyes:     Conjunctiva/sclera: Conjunctivae normal.     Pupils: Pupils are equal, round, and reactive to light.  Cardiovascular:     Rate and Rhythm: Normal rate and regular rhythm.     Pulses: Normal pulses.     Heart sounds: Normal heart sounds.  Pulmonary:     Effort: Pulmonary effort is normal.     Breath sounds: Normal breath sounds.  Abdominal:     Palpations: Abdomen is soft.  Musculoskeletal:        General:  No swelling. Normal range of motion.     Cervical back: Normal range of motion.     Right lower leg: No edema.     Left lower leg: No edema.  Skin:    General: Skin is warm and dry.     Capillary Refill: Capillary refill takes less than 2 seconds.  Neurological:     General: No focal deficit present.  Mental Status: He is alert. Mental status is at baseline.  Psychiatric:        Mood and Affect: Mood normal.    Arrived for home visit for Richard [name] who was alert and oriented reporting to be feeling good with no complaints today reporting he is feeling better. Richard [name] has been compliant with medications over the last week. I reviewed bubble packs and they were missing (Allupurinol, Duloxetine, Torsemide 1 extra in the morning)  I called [facility] and they reports they need updated prescriptions for these. I called PCP and HF clinic to obtain these and they are sending same to [facility]. I filled one pill box for Richard [name] to ensure he gets his medications for this week and I will take remaining medications to [facility] to get the bubble packs updated. Richard [name] reports he does not want to take Vascepa or Omeprazole any more. I will ensure his bubble packs do not have same. Richard [name] Medici PA made aware of this last week at clinic visit. I obtained vitals and they are as noted. No edema noted, abdomen not as distended as it was last week. Lung sounds clear. No JVD. Richard [name] reports he is urinating often and is feeling better this week. Richard [name] reminded of appointments. I will see him in one week. Home visit complete.   CBG- 77    Future Appointments  Date Time Provider Department Center  12/28/2020  1:30 PM [facility] PA/NP [facility] None  01/18/2021  9:00 AM Saguier, Kateri Mc [facility]  03/01/2021  8:10 AM [facility] DEVICE REMOTES [facility]  05/31/2021  8:10 AM [facility] DEVICE REMOTES [facility]  08/30/2021  8:10 AM [facility] DEVICE REMOTES  [facility]  11/29/2021  8:10 AM [facility] DEVICE REMOTES [facility]     ACTION: Home visit completed

## 2020-12-14 ENCOUNTER — Other Ambulatory Visit (HOSPITAL_COMMUNITY): Payer: Self-pay

## 2020-12-14 NOTE — Progress Notes (Signed)
Paramedicine Encounter    Patient ID: Richard [name], male    DOB: 03-24-1961, 60 y.o.   MRN: [id number]  Picked up and verified Richard [name]'s bubble packs. I delivered same and reviewed how to take them accordingly. Richard [name] agreed and understood. He agreed to follow up in two weeks in clinic. Home visit complete.   Bubble packs missing Allupurinol- Gout doctor has been notified of needing a new RX sent to [facility] however Pharmacy has not received same yet. Once received bubble packs will be reconciled.   ACTION: Home visit completed

## 2020-12-15 ENCOUNTER — Other Ambulatory Visit: Payer: Self-pay | Admitting: Cardiology

## 2020-12-15 ENCOUNTER — Other Ambulatory Visit (HOSPITAL_COMMUNITY): Payer: Self-pay | Admitting: Cardiology

## 2020-12-21 NOTE — Progress Notes (Signed)
Remote ICD transmission.   

## 2020-12-28 ENCOUNTER — Telehealth (HOSPITAL_COMMUNITY): Payer: Self-pay

## 2020-12-28 ENCOUNTER — Encounter (HOSPITAL_COMMUNITY): Payer: Medicare Other

## 2020-12-28 NOTE — Telephone Encounter (Signed)
Left message reminding Richard [name] of his appointment today at 1:30. I will continue to call and remind him.

## 2021-01-05 ENCOUNTER — Telehealth (HOSPITAL_COMMUNITY): Payer: Self-pay

## 2021-01-05 NOTE — Telephone Encounter (Signed)
Attempted to reach Richard [name] for scheduling home visit for tomorrow. His wife answered and reported he is not home. I will try again tomorrow.

## 2021-01-12 ENCOUNTER — Telehealth (HOSPITAL_COMMUNITY): Payer: Self-pay

## 2021-01-12 NOTE — Telephone Encounter (Signed)
Called to notify Brick of his appointment in the HF clinic on Friday and attempt to schedule a home visit. No answer upon calling, I left a voicemail and will attempt to continue to follow up.

## 2021-01-14 ENCOUNTER — Encounter (HOSPITAL_COMMUNITY): Payer: Medicare Other

## 2021-01-15 ENCOUNTER — Inpatient Hospital Stay (HOSPITAL_COMMUNITY): Payer: Medicare Other

## 2021-01-15 ENCOUNTER — Inpatient Hospital Stay (HOSPITAL_COMMUNITY)
Admission: EM | Admit: 2021-01-15 | Discharge: 2021-01-27 | DRG: 853 | Disposition: A | Discharge status: Rehabilitation Facility | Payer: Medicare Other | Attending: Internal Medicine | Admitting: Internal Medicine

## 2021-01-15 ENCOUNTER — Other Ambulatory Visit: Payer: Self-pay

## 2021-01-15 ENCOUNTER — Emergency Department (HOSPITAL_COMMUNITY): Payer: Medicare Other

## 2021-01-15 DIAGNOSIS — E781 Pure hyperglyceridemia: Secondary | ICD-10-CM | POA: Diagnosis present

## 2021-01-15 DIAGNOSIS — I5043 Acute on chronic combined systolic (congestive) and diastolic (congestive) heart failure: Secondary | ICD-10-CM | POA: Diagnosis present

## 2021-01-15 DIAGNOSIS — A419 Sepsis, unspecified organism: Secondary | ICD-10-CM

## 2021-01-15 DIAGNOSIS — E43 Unspecified severe protein-calorie malnutrition: Secondary | ICD-10-CM | POA: Diagnosis present

## 2021-01-15 DIAGNOSIS — I10 Essential (primary) hypertension: Secondary | ICD-10-CM | POA: Diagnosis not present

## 2021-01-15 DIAGNOSIS — A408 Other streptococcal sepsis: Secondary | ICD-10-CM | POA: Diagnosis present

## 2021-01-15 DIAGNOSIS — I420 Dilated cardiomyopathy: Secondary | ICD-10-CM | POA: Diagnosis present

## 2021-01-15 DIAGNOSIS — G479 Sleep disorder, unspecified: Secondary | ICD-10-CM | POA: Diagnosis not present

## 2021-01-15 DIAGNOSIS — L97329 Non-pressure chronic ulcer of left ankle with unspecified severity: Secondary | ICD-10-CM | POA: Diagnosis present

## 2021-01-15 DIAGNOSIS — R140 Abdominal distension (gaseous): Secondary | ICD-10-CM

## 2021-01-15 DIAGNOSIS — Z6831 Body mass index (BMI) 31.0-31.9, adult: Secondary | ICD-10-CM | POA: Diagnosis not present

## 2021-01-15 DIAGNOSIS — Z95810 Presence of automatic (implantable) cardiac defibrillator: Secondary | ICD-10-CM | POA: Diagnosis not present

## 2021-01-15 DIAGNOSIS — E11621 Type 2 diabetes mellitus with foot ulcer: Secondary | ICD-10-CM | POA: Diagnosis present

## 2021-01-15 DIAGNOSIS — I5022 Chronic systolic (congestive) heart failure: Secondary | ICD-10-CM | POA: Diagnosis not present

## 2021-01-15 DIAGNOSIS — R6510 Systemic inflammatory response syndrome (SIRS) of non-infectious origin without acute organ dysfunction: Secondary | ICD-10-CM | POA: Diagnosis present

## 2021-01-15 DIAGNOSIS — E871 Hypo-osmolality and hyponatremia: Secondary | ICD-10-CM | POA: Diagnosis not present

## 2021-01-15 DIAGNOSIS — J9622 Acute and chronic respiratory failure with hypercapnia: Secondary | ICD-10-CM | POA: Diagnosis present

## 2021-01-15 DIAGNOSIS — E1122 Type 2 diabetes mellitus with diabetic chronic kidney disease: Secondary | ICD-10-CM | POA: Diagnosis present

## 2021-01-15 DIAGNOSIS — R0602 Shortness of breath: Secondary | ICD-10-CM

## 2021-01-15 DIAGNOSIS — N1831 Chronic kidney disease, stage 3a: Secondary | ICD-10-CM | POA: Diagnosis present

## 2021-01-15 DIAGNOSIS — R7881 Bacteremia: Secondary | ICD-10-CM | POA: Diagnosis not present

## 2021-01-15 DIAGNOSIS — Z794 Long term (current) use of insulin: Secondary | ICD-10-CM

## 2021-01-15 DIAGNOSIS — E669 Obesity, unspecified: Secondary | ICD-10-CM | POA: Diagnosis present

## 2021-01-15 DIAGNOSIS — J42 Unspecified chronic bronchitis: Secondary | ICD-10-CM | POA: Diagnosis not present

## 2021-01-15 DIAGNOSIS — Z79899 Other long term (current) drug therapy: Secondary | ICD-10-CM

## 2021-01-15 DIAGNOSIS — Z89512 Acquired absence of left leg below knee: Secondary | ICD-10-CM

## 2021-01-15 DIAGNOSIS — J449 Chronic obstructive pulmonary disease, unspecified: Secondary | ICD-10-CM | POA: Diagnosis not present

## 2021-01-15 DIAGNOSIS — Z833 Family history of diabetes mellitus: Secondary | ICD-10-CM

## 2021-01-15 DIAGNOSIS — Z955 Presence of coronary angioplasty implant and graft: Secondary | ICD-10-CM

## 2021-01-15 DIAGNOSIS — R6520 Severe sepsis without septic shock: Secondary | ICD-10-CM | POA: Diagnosis present

## 2021-01-15 DIAGNOSIS — I5082 Biventricular heart failure: Secondary | ICD-10-CM | POA: Diagnosis present

## 2021-01-15 DIAGNOSIS — I739 Peripheral vascular disease, unspecified: Secondary | ICD-10-CM | POA: Diagnosis not present

## 2021-01-15 DIAGNOSIS — I951 Orthostatic hypotension: Secondary | ICD-10-CM | POA: Diagnosis not present

## 2021-01-15 DIAGNOSIS — L97429 Non-pressure chronic ulcer of left heel and midfoot with unspecified severity: Secondary | ICD-10-CM | POA: Diagnosis present

## 2021-01-15 DIAGNOSIS — Z20822 Contact with and (suspected) exposure to covid-19: Secondary | ICD-10-CM | POA: Diagnosis present

## 2021-01-15 DIAGNOSIS — G9341 Metabolic encephalopathy: Secondary | ICD-10-CM | POA: Diagnosis present

## 2021-01-15 DIAGNOSIS — M109 Gout, unspecified: Secondary | ICD-10-CM | POA: Diagnosis present

## 2021-01-15 DIAGNOSIS — E1165 Type 2 diabetes mellitus with hyperglycemia: Secondary | ICD-10-CM | POA: Diagnosis present

## 2021-01-15 DIAGNOSIS — Q859 Phakomatosis, unspecified: Secondary | ICD-10-CM | POA: Diagnosis not present

## 2021-01-15 DIAGNOSIS — J9621 Acute and chronic respiratory failure with hypoxia: Secondary | ICD-10-CM | POA: Diagnosis present

## 2021-01-15 DIAGNOSIS — D751 Secondary polycythemia: Secondary | ICD-10-CM | POA: Diagnosis present

## 2021-01-15 DIAGNOSIS — E1169 Type 2 diabetes mellitus with other specified complication: Secondary | ICD-10-CM | POA: Diagnosis present

## 2021-01-15 DIAGNOSIS — F32A Depression, unspecified: Secondary | ICD-10-CM | POA: Diagnosis present

## 2021-01-15 DIAGNOSIS — N179 Acute kidney failure, unspecified: Secondary | ICD-10-CM | POA: Diagnosis present

## 2021-01-15 DIAGNOSIS — E1152 Type 2 diabetes mellitus with diabetic peripheral angiopathy with gangrene: Secondary | ICD-10-CM | POA: Diagnosis present

## 2021-01-15 DIAGNOSIS — Z8249 Family history of ischemic heart disease and other diseases of the circulatory system: Secondary | ICD-10-CM

## 2021-01-15 DIAGNOSIS — G4733 Obstructive sleep apnea (adult) (pediatric): Secondary | ICD-10-CM | POA: Diagnosis present

## 2021-01-15 DIAGNOSIS — E7800 Pure hypercholesterolemia, unspecified: Secondary | ICD-10-CM | POA: Diagnosis present

## 2021-01-15 DIAGNOSIS — D631 Anemia in chronic kidney disease: Secondary | ICD-10-CM | POA: Diagnosis present

## 2021-01-15 DIAGNOSIS — R778 Other specified abnormalities of plasma proteins: Secondary | ICD-10-CM | POA: Diagnosis present

## 2021-01-15 DIAGNOSIS — L97324 Non-pressure chronic ulcer of left ankle with necrosis of bone: Secondary | ICD-10-CM | POA: Diagnosis not present

## 2021-01-15 DIAGNOSIS — L0390 Cellulitis, unspecified: Secondary | ICD-10-CM | POA: Diagnosis not present

## 2021-01-15 DIAGNOSIS — S88112A Complete traumatic amputation at level between knee and ankle, left lower leg, initial encounter: Secondary | ICD-10-CM | POA: Diagnosis not present

## 2021-01-15 DIAGNOSIS — R7309 Other abnormal glucose: Secondary | ICD-10-CM | POA: Diagnosis not present

## 2021-01-15 DIAGNOSIS — M869 Osteomyelitis, unspecified: Secondary | ICD-10-CM | POA: Diagnosis present

## 2021-01-15 DIAGNOSIS — I130 Hypertensive heart and chronic kidney disease with heart failure and stage 1 through stage 4 chronic kidney disease, or unspecified chronic kidney disease: Secondary | ICD-10-CM | POA: Diagnosis present

## 2021-01-15 DIAGNOSIS — J441 Chronic obstructive pulmonary disease with (acute) exacerbation: Secondary | ICD-10-CM | POA: Diagnosis present

## 2021-01-15 DIAGNOSIS — I2510 Atherosclerotic heart disease of native coronary artery without angina pectoris: Secondary | ICD-10-CM | POA: Diagnosis present

## 2021-01-15 DIAGNOSIS — Z87891 Personal history of nicotine dependence: Secondary | ICD-10-CM

## 2021-01-15 DIAGNOSIS — Z9981 Dependence on supplemental oxygen: Secondary | ICD-10-CM

## 2021-01-15 DIAGNOSIS — R000 Tachycardia, unspecified: Secondary | ICD-10-CM | POA: Diagnosis not present

## 2021-01-15 DIAGNOSIS — E1140 Type 2 diabetes mellitus with diabetic neuropathy, unspecified: Secondary | ICD-10-CM | POA: Diagnosis not present

## 2021-01-15 DIAGNOSIS — T148XXA Other injury of unspecified body region, initial encounter: Secondary | ICD-10-CM

## 2021-01-15 DIAGNOSIS — G546 Phantom limb syndrome with pain: Secondary | ICD-10-CM | POA: Diagnosis not present

## 2021-01-15 DIAGNOSIS — E1142 Type 2 diabetes mellitus with diabetic polyneuropathy: Secondary | ICD-10-CM | POA: Diagnosis not present

## 2021-01-15 DIAGNOSIS — K219 Gastro-esophageal reflux disease without esophagitis: Secondary | ICD-10-CM | POA: Diagnosis present

## 2021-01-15 DIAGNOSIS — L97519 Non-pressure chronic ulcer of other part of right foot with unspecified severity: Secondary | ICD-10-CM | POA: Diagnosis present

## 2021-01-15 DIAGNOSIS — K5900 Constipation, unspecified: Secondary | ICD-10-CM | POA: Diagnosis present

## 2021-01-15 DIAGNOSIS — I509 Heart failure, unspecified: Secondary | ICD-10-CM | POA: Diagnosis not present

## 2021-01-15 DIAGNOSIS — R7989 Other specified abnormal findings of blood chemistry: Secondary | ICD-10-CM | POA: Diagnosis present

## 2021-01-15 DIAGNOSIS — I70234 Atherosclerosis of native arteries of right leg with ulceration of heel and midfoot: Secondary | ICD-10-CM | POA: Diagnosis not present

## 2021-01-15 DIAGNOSIS — N183 Chronic kidney disease, stage 3 unspecified: Secondary | ICD-10-CM | POA: Diagnosis present

## 2021-01-15 DIAGNOSIS — N189 Chronic kidney disease, unspecified: Secondary | ICD-10-CM | POA: Diagnosis present

## 2021-01-15 DIAGNOSIS — Z7982 Long term (current) use of aspirin: Secondary | ICD-10-CM

## 2021-01-15 DIAGNOSIS — Z7984 Long term (current) use of oral hypoglycemic drugs: Secondary | ICD-10-CM

## 2021-01-15 LAB — I-STAT ARTERIAL BLOOD GAS, ED
Acid-Base Excess: 3 mmol/L — ABNORMAL HIGH (ref 0.0–2.0)
Acid-Base Excess: 1 mmol/L (ref 0.0–2.0)
Bicarbonate: 29.2 mmol/L — ABNORMAL HIGH (ref 20.0–28.0)
Bicarbonate: 29.4 mmol/L — ABNORMAL HIGH (ref 20.0–28.0)
Calcium, Ion: 1.14 mmol/L — ABNORMAL LOW (ref 1.15–1.40)
Calcium, Ion: 1.18 mmol/L (ref 1.15–1.40)
HCT: 57 % — ABNORMAL HIGH (ref 39.0–52.0)
HCT: 56 % — ABNORMAL HIGH (ref 39.0–52.0)
Hemoglobin: 19.4 g/dL — ABNORMAL HIGH (ref 13.0–17.0)
Hemoglobin: 19 g/dL — ABNORMAL HIGH (ref 13.0–17.0)
O2 Saturation: 93 %
O2 Saturation: 96 %
Patient temperature: 98.6
Potassium: 4.6 mmol/L (ref 3.5–5.1)
Potassium: 4.9 mmol/L (ref 3.5–5.1)
Sodium: 132 mmol/L — ABNORMAL LOW (ref 135–145)
Sodium: 134 mmol/L — ABNORMAL LOW (ref 135–145)
TCO2: 31 mmol/L (ref 22–32)
TCO2: 31 mmol/L (ref 22–32)
pCO2 arterial: 46.4 mmHg (ref 32.0–48.0)
pCO2 arterial: 59.2 mmHg — ABNORMAL HIGH (ref 32.0–48.0)
pH, Arterial: 7.407 (ref 7.350–7.450)
pH, Arterial: 7.304 — ABNORMAL LOW (ref 7.350–7.450)
pO2, Arterial: 69 mmHg — ABNORMAL LOW (ref 83.0–108.0)
pO2, Arterial: 90 mmHg (ref 83.0–108.0)

## 2021-01-15 LAB — RESP PANEL BY RT-PCR (FLU A&B, COVID) ARPGX2
Influenza A by PCR: NEGATIVE
Influenza B by PCR: NEGATIVE
SARS Coronavirus 2 by RT PCR: NEGATIVE

## 2021-01-15 LAB — CBC WITH DIFFERENTIAL/PLATELET
Abs Immature Granulocytes: 0.3 10*3/uL — ABNORMAL HIGH (ref 0.00–0.07)
Basophils Absolute: 0.1 10*3/uL (ref 0.0–0.1)
Basophils Relative: 0 %
Eosinophils Absolute: 0.1 10*3/uL (ref 0.0–0.5)
Eosinophils Relative: 0 %
HCT: 53.8 % — ABNORMAL HIGH (ref 39.0–52.0)
Hemoglobin: 17.8 g/dL — ABNORMAL HIGH (ref 13.0–17.0)
Immature Granulocytes: 1 %
Lymphocytes Relative: 4 %
Lymphs Abs: 0.8 10*3/uL (ref 0.7–4.0)
MCH: 29 pg (ref 26.0–34.0)
MCHC: 33.1 g/dL (ref 30.0–36.0)
MCV: 87.8 fL (ref 80.0–100.0)
Monocytes Absolute: 2.7 10*3/uL — ABNORMAL HIGH (ref 0.1–1.0)
Monocytes Relative: 12 %
Neutro Abs: 18.9 10*3/uL — ABNORMAL HIGH (ref 1.7–7.7)
Neutrophils Relative %: 83 %
Platelets: 276 10*3/uL (ref 150–400)
RBC: 6.13 MIL/uL — ABNORMAL HIGH (ref 4.22–5.81)
RDW: 19.2 % — ABNORMAL HIGH (ref 11.5–15.5)
WBC: 22.9 10*3/uL — ABNORMAL HIGH (ref 4.0–10.5)
nRBC: 0 % (ref 0.0–0.2)

## 2021-01-15 LAB — COMPREHENSIVE METABOLIC PANEL
ALT: 22 U/L (ref 0–44)
AST: 23 U/L (ref 15–41)
Albumin: 2.5 g/dL — ABNORMAL LOW (ref 3.5–5.0)
Alkaline Phosphatase: 73 U/L (ref 38–126)
Anion gap: 16 — ABNORMAL HIGH (ref 5–15)
BUN: 68 mg/dL — ABNORMAL HIGH (ref 6–20)
CO2: 25 mmol/L (ref 22–32)
Calcium: 8.9 mg/dL (ref 8.9–10.3)
Chloride: 90 mmol/L — ABNORMAL LOW (ref 98–111)
Creatinine, Ser: 2.26 mg/dL — ABNORMAL HIGH (ref 0.61–1.24)
GFR, Estimated: 33 mL/min — ABNORMAL LOW (ref >60)
Glucose, Bld: 362 mg/dL — ABNORMAL HIGH (ref 70–99)
Potassium: 4.8 mmol/L (ref 3.5–5.1)
Sodium: 131 mmol/L — ABNORMAL LOW (ref 135–145)
Total Bilirubin: 1.7 mg/dL — ABNORMAL HIGH (ref 0.3–1.2)
Total Protein: 8.1 g/dL (ref 6.5–8.1)

## 2021-01-15 LAB — GLUCOSE, CAPILLARY: Glucose-Capillary: 393 mg/dL — ABNORMAL HIGH (ref 70–99)

## 2021-01-15 LAB — TROPONIN I (HIGH SENSITIVITY)
Troponin I (High Sensitivity): 37 ng/L — ABNORMAL HIGH (ref <18)
Troponin I (High Sensitivity): 37 ng/L — ABNORMAL HIGH (ref <18)

## 2021-01-15 LAB — DIGOXIN LEVEL: Digoxin Level: 0.5 ng/mL — ABNORMAL LOW (ref 0.8–2.0)

## 2021-01-15 LAB — LACTIC ACID, PLASMA
Lactic Acid, Venous: 2.4 mmol/L (ref 0.5–1.9)
Lactic Acid, Venous: 2.1 mmol/L (ref 0.5–1.9)
Lactic Acid, Venous: 1.8 mmol/L (ref 0.5–1.9)

## 2021-01-15 LAB — BRAIN NATRIURETIC PEPTIDE: B Natriuretic Peptide: 532.8 pg/mL — ABNORMAL HIGH (ref 0.0–100.0)

## 2021-01-15 MED ORDER — SODIUM CHLORIDE 0.9 % IV SOLN
2.0000 g | INTRAVENOUS | Status: DC
  Administered 2021-01-16 – 2021-01-17 (×2): 2 g via INTRAVENOUS
  Filled 2021-01-15 (×2): qty 20

## 2021-01-15 MED ORDER — SODIUM CHLORIDE 0.9 % IV SOLN
500.0000 mg | Freq: Once | INTRAVENOUS | Status: AC
  Administered 2021-01-15: 500 mg via INTRAVENOUS
  Filled 2021-01-15: qty 500

## 2021-01-15 MED ORDER — DOCUSATE SODIUM 100 MG PO CAPS
100.0000 mg | ORAL_CAPSULE | Freq: Two times a day (BID) | ORAL | Status: DC
  Administered 2021-01-16 – 2021-01-27 (×19): 100 mg via ORAL
  Filled 2021-01-15 (×21): qty 1

## 2021-01-15 MED ORDER — FUROSEMIDE 10 MG/ML IJ SOLN
40.0000 mg | Freq: Two times a day (BID) | INTRAMUSCULAR | Status: DC
  Administered 2021-01-16: 40 mg via INTRAVENOUS
  Filled 2021-01-15: qty 4

## 2021-01-15 MED ORDER — PANTOPRAZOLE SODIUM 40 MG IV SOLR
40.0000 mg | Freq: Every day | INTRAVENOUS | Status: DC
Start: 2021-01-15 — End: 2021-01-17
  Administered 2021-01-16 (×2): 40 mg via INTRAVENOUS
  Filled 2021-01-15 (×2): qty 40

## 2021-01-15 MED ORDER — IPRATROPIUM-ALBUTEROL 0.5-2.5 (3) MG/3ML IN SOLN
3.0000 mL | Freq: Four times a day (QID) | RESPIRATORY_TRACT | Status: DC
  Administered 2021-01-16 (×4): 3 mL via RESPIRATORY_TRACT
  Filled 2021-01-15 (×3): qty 3

## 2021-01-15 MED ORDER — SODIUM CHLORIDE 0.9 % IV BOLUS
1000.0000 mL | Freq: Once | INTRAVENOUS | Status: AC
  Administered 2021-01-15: 1000 mL via INTRAVENOUS

## 2021-01-15 MED ORDER — PREDNISONE 20 MG PO TABS
40.0000 mg | ORAL_TABLET | Freq: Every day | ORAL | Status: AC
  Administered 2021-01-17 – 2021-01-20 (×4): 40 mg via ORAL
  Filled 2021-01-15 (×4): qty 2

## 2021-01-15 MED ORDER — ACETAMINOPHEN 650 MG RE SUPP: 650.0000 mg | Freq: Four times a day (QID) | RECTAL | Status: DC | PRN

## 2021-01-15 MED ORDER — SODIUM CHLORIDE 0.9 % IV SOLN
75.0000 mL/h | INTRAVENOUS | Status: DC
  Administered 2021-01-16: 75 mL/h via INTRAVENOUS

## 2021-01-15 MED ORDER — SODIUM CHLORIDE 0.9 % IV SOLN
1.0000 g | Freq: Once | INTRAVENOUS | Status: AC
  Administered 2021-01-15: 1 g via INTRAVENOUS
  Filled 2021-01-15: qty 10

## 2021-01-15 MED ORDER — ATORVASTATIN CALCIUM 40 MG PO TABS
40.0000 mg | ORAL_TABLET | Freq: Every day | ORAL | Status: DC
  Administered 2021-01-16 – 2021-01-27 (×12): 40 mg via ORAL
  Filled 2021-01-15 (×12): qty 1

## 2021-01-15 MED ORDER — ALBUTEROL SULFATE (2.5 MG/3ML) 0.083% IN NEBU
2.5000 mg | INHALATION_SOLUTION | Freq: Once | RESPIRATORY_TRACT | Status: AC
  Administered 2021-01-15: 2.5 mg via RESPIRATORY_TRACT
  Filled 2021-01-15: qty 3

## 2021-01-15 MED ORDER — SODIUM CHLORIDE 0.9 % IV SOLN
500.0000 mg | INTRAVENOUS | Status: AC
  Administered 2021-01-16 – 2021-01-19 (×4): 500 mg via INTRAVENOUS
  Filled 2021-01-15 (×4): qty 500

## 2021-01-15 MED ORDER — SENNA 8.6 MG PO TABS
1.0000 | ORAL_TABLET | Freq: Two times a day (BID) | ORAL | Status: DC
  Administered 2021-01-16 – 2021-01-27 (×20): 8.6 mg via ORAL
  Filled 2021-01-15 (×21): qty 1

## 2021-01-15 MED ORDER — IPRATROPIUM-ALBUTEROL 0.5-2.5 (3) MG/3ML IN SOLN
3.0000 mL | Freq: Once | RESPIRATORY_TRACT | Status: AC
  Administered 2021-01-15: 3 mL via RESPIRATORY_TRACT
  Filled 2021-01-15: qty 3

## 2021-01-15 MED ORDER — IPRATROPIUM-ALBUTEROL 0.5-2.5 (3) MG/3ML IN SOLN: 3.0000 mL | Freq: Four times a day (QID) | RESPIRATORY_TRACT | Status: DC

## 2021-01-15 MED ORDER — ALBUTEROL SULFATE (2.5 MG/3ML) 0.083% IN NEBU: 2.5000 mg | INHALATION_SOLUTION | RESPIRATORY_TRACT | Status: DC | PRN

## 2021-01-15 MED ORDER — METHYLPREDNISOLONE SODIUM SUCC 40 MG IJ SOLR
40.0000 mg | Freq: Two times a day (BID) | INTRAMUSCULAR | Status: AC
Start: 2021-01-16 — End: 2021-01-16
  Administered 2021-01-16 (×2): 40 mg via INTRAVENOUS
  Filled 2021-01-15 (×2): qty 1

## 2021-01-15 MED ORDER — HEPARIN SODIUM (PORCINE) 5000 UNIT/ML IJ SOLN
5000.0000 [IU] | Freq: Three times a day (TID) | INTRAMUSCULAR | Status: DC
  Administered 2021-01-16 – 2021-01-27 (×35): 5000 [IU] via SUBCUTANEOUS
  Filled 2021-01-15 (×34): qty 1

## 2021-01-15 MED ORDER — ACETAMINOPHEN 325 MG PO TABS: 650.0000 mg | ORAL_TABLET | Freq: Four times a day (QID) | ORAL | Status: DC | PRN

## 2021-01-15 MED ORDER — INSULIN ASPART 100 UNIT/ML IJ SOLN
0.0000 [IU] | INTRAMUSCULAR | Status: DC
  Administered 2021-01-16 (×2): 9 [IU] via SUBCUTANEOUS

## 2021-01-15 NOTE — Progress Notes (Signed)
RT called to assess pt for periods of desaturation while on CPAP 4cmH20, 35%. While on these settings, pt barely achieving volumes of , SpO2 staying around 89-90%. CPAP increased to 8cmH20, 40%. Pt now achieving volumes ranging from 450-673mL and SpO2 around 95%. RT will collect repeat ABG in about 76min-1hr to reflect most recent changes.

## 2021-01-15 NOTE — ED Provider Notes (Signed)
[name] EM[facility]Y DEPARTMENT Provider Note   CSN: [id number] Arrival date & time: 01/15/21  1518     History Chief Complaint  Patient presents with  . Shortness of Breath    Richard [name] is a 60 y.o. male.  Patient presents with chief complaint shortness of breath.  Ongoing for about a week but worse in the last 24 hours.  Typically uses 2 L nasal cannula at home but has had to increase that to about 4 L of cannula.  Complaining of generalized body aches.  Denies specific chest pain or abdominal pain.  Denies any fevers.  Has an unchanged cough.  No vomiting or diarrhea reported.      Past Medical History:  Diagnosis Date  . AICD (automatic cardioverter/defibrillator) present 01/11/2017  . Anxiety   . CHF (congestive heart failure) (HCC)   . CKD (chronic kidney disease)   . COPD (chronic obstructive pulmonary disease) (HCC)    never smoker, industrial exposure  . Diabetes mellitus with diabetic neuropathy, with long-term current use of insulin (HCC)   . GERD (gastroesophageal reflux disease)   . High cholesterol   . History of gout    "take RX qd" (11/01/2017)  . Hypertension   . Nonobstructive atherosclerosis of coronary artery   . On home oxygen therapy    "2L prn" (11/01/2017)  . 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HCC)    LLL, present for years  . Systolic HF (heart failure) [facility])     Patient Active Problem List   Diagnosis Date Noted  . CHF exacerbation (HCC) 10/17/2020  . Acute on chronic systolic CHF (congestive heart failure) (HCC) 10/16/2020  . Acute on chronic combined systolic and diastolic CHF (congestive heart failure) (HCC) 03/15/2018  . S/P right and left heart catheterization 11/02/2017  . Panic attacks 11/02/2017  . Non-STEMI (non-ST elevated myocardial infarction) (HCC)   . COPD (chronic obstructive pulmonary disease) (HCC)   . Hypertension   . CKD (chronic kidney disease)   . 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HCC)   . ICD (implantable  cardioverter-defibrillator) in place   . GERD (gastroesophageal reflux disease)   . Diabetes mellitus with diabetic neuropathy, with long-term current use of insulin [facility])     Past Surgical History:  Procedure Laterality Date  . CATARACT EXTRACTION W/ INTRAOCULAR LENS IMPLANTW/ TRABECULECTOMY Left ~ 2013   "may have done this when I had my other eye OR"  . CIRCUMCISION    . CORONARY STENT INTERVENTION N/A 11/02/2017   Procedure: CORONARY STENT INTERVENTION;  Surgeon: Laurey Morale, MD;  Location: [facility] INVASIVE CV LAB;  Service: Cardiovascular;  Laterality: N/A;  . CORONARY STENT INTERVENTION N/A 11/02/2017   Procedure: CORONARY STENT INTERVENTION;  Surgeon: Lyn Records, MD;  Location: [facility] INVASIVE CV LAB;  Service: Cardiovascular;  Laterality: N/A;  . EYE SURGERY Left ~ 2013   "fell on something in basement; stuck in my eye"  . ICD IMPLANT     Medtronic Dual Chamber 01/11/17  . MULTIPLE TOOTH EXTRACTIONS     "pulled all my top teeth"  . RIGHT/LEFT HEART CATH AND CORONARY ANGIOGRAPHY N/A 11/02/2017   Procedure: RIGHT/LEFT HEART CATH AND CORONARY ANGIOGRAPHY;  Surgeon: Laurey Morale, MD;  Location: [facility] INVASIVE CV LAB;  Service: Cardiovascular;  Laterality: N/A;       Family History  Problem Relation Age of Onset  . Hypertension Mother   . Diabetes Mother   . Hypertension Father   . Diabetes Father   .  Diabetes Sister   . Diabetes Brother     Social History   Tobacco Use  . Smoking status: Former    Types: Cigarettes  . Smokeless tobacco: Never  . Tobacco comments:    "smoked when I drank"  Vaping Use  . Vaping Use: Never used  Substance Use Topics  . 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 Drug use: Never    Home Medications Prior to Admission medications   Medication Sig Start Date End Date Taking? Authorizing Provider  acetaminophen (TYLENOL) 650 MG CR tablet Take 650 mg by mouth every 8 (eight) hours as  needed for pain.    [provider]  albuterol (VENTOLIN HFA) 108 (90 Base) MCG/ACT inhaler Inhale 1-2 puffs into the lungs every 4 (four) hours as needed for wheezing or shortness of breath. 10/17/20   Alessandra Bevels, MD  allopurinol (ZYLOPRIM) 100 MG tablet Take 1 tablet (100 mg total) by mouth daily. Patient not taking: Reported on 12/07/2020 12/26/17   Tonye Becket D, NP  aspirin 81 MG chewable tablet Chew 81 mg by mouth daily.    [provider]  atorvastatin (LIPITOR) 40 MG tablet TAKE ONE TABLET BY MOUTH DAILY AT [name] 11/15/20   Laurey Morale, MD  bisoprolol (ZEBETA) 5 MG tablet Take 2.5 mg by mouth daily.    [provider]  digoxin (LANOXIN) 0.125 MG tablet TAKE 1 TABLET(0.125 MG) BY MOUTH DAILY 04/28/20   Laurey Morale, MD  docusate sodium (COLACE) 100 MG capsule Take 100 mg by mouth daily as needed for mild constipation.    [provider]  DULoxetine (CYMBALTA) 60 MG capsule Take 60 mg by mouth daily. 09/28/17   [provider]  eplerenone (INSPRA) 50 MG tablet Take 1 tablet (50 mg total) by mouth daily. 07/27/20   Laurey Morale, MD  fenofibrate (TRICOR) 145 MG tablet TAKE ONE TABLET BY MOUTH DAILY 12/15/20   Laurey Morale, MD  hydrALAZINE (APRESOLINE) 50 MG tablet Take 1.5 tablets (75 mg total) by mouth 3 (three) times daily. 09/09/20   Laurey Morale, MD  icosapent Ethyl (VASCEPA) 1 g capsule Take 2 capsules (2 g total) by mouth 2 (two) times daily. Patient not taking: No sig reported 10/08/20   Laurey Morale, MD  Insulin Pen Needle (NOVOFINE) 30G X 8 MM MISC Inject 10 each into the skin as needed. 11/06/17   Arnetha Courser, MD  insulin regular human CONCENTRATED (HUMULIN R) 500 UNIT/ML kwikpen Inject 0-400 Units into the skin as directed. Sliding 07/18/17   [provider]  isosorbide dinitrate (ISORDIL) 20 MG tablet TAKE TWO TABLETS BY MOUTH THREE TIMES A DAY 12/15/20   Laurey Morale, MD  JARDIANCE 25 MG TABS tablet Take 25 mg  by mouth daily. 10/15/19   Laurey Morale, MD  MAGNESIUM-OXIDE 400 (241.3 Mg) MG tablet Take 1 tablet (400 mg total) by mouth daily. 08/27/20   Laurey Morale, MD  omeprazole (PRILOSEC) 20 MG capsule TAKE ONE CAPSULE BY MOUTH DAILY Patient not taking: No sig reported 10/20/20   Laurey Morale, MD  pregabalin (LYRICA) 150 MG capsule Take 150 mg by mouth 3 (three) times daily. 10/07/20   [provider]  sacubitril-valsartan (ENTRESTO) 97-103 MG Take 1 tablet by mouth 2 (two) times daily. 03/17/20   Laurey Morale, MD  Torsemide 40 MG TABS Take 80 mg by mouth in the  morning AND 60 mg every evening. 12/07/20   Laurey Morale, MD    Allergies    Aspirin, Codeine, and Coconut oil  Review of Systems   Review of Systems  Constitutional:  Negative for fever.  HENT:  Negative for ear pain and sore throat.   Eyes:  Negative for pain.  Respiratory:  Positive for cough and shortness of breath.   Cardiovascular:  Negative for chest pain.  Gastrointestinal:  Negative for abdominal pain.  Genitourinary:  Negative for flank pain.  Musculoskeletal:  Negative for back pain.  Skin:  Negative for color change and rash.  Neurological:  Negative for syncope.  All other systems reviewed and are negative.  Physical Exam Updated Vital Signs BP 113/68   Pulse 89   Temp 98.6 F (37 C) (Oral)   Resp (!) 23   SpO2 95%   Physical Exam Constitutional:      General: He is not in acute distress.    Appearance: He is well-developed.  HENT:     Head: Normocephalic.     Nose: Nose normal.  Eyes:     Extraocular Movements: Extraocular movements intact.  Cardiovascular:     Rate and Rhythm: Normal rate.  Pulmonary:     Effort: Tachypnea present.     Breath sounds: Wheezing present.  Skin:    Coloration: Skin is not jaundiced.  Neurological:     Mental Status: He is alert. Mental status is at baseline.    ED Results / Procedures / Treatments   Labs (all labs ordered are listed, but only  abnormal results are displayed) Labs Reviewed  CBC WITH DIFFERENTIAL/PLATELET - Abnormal; Notable for the following components:      Result Value   WBC 22.9 (*)    RBC 6.13 (*)    Hemoglobin 17.8 (*)    HCT 53.8 (*)    RDW 19.2 (*)    Neutro Abs 18.9 (*)    Monocytes Absolute 2.7 (*)    Abs Immature Granulocytes 0.30 (*)    All other components within normal limits  COMPREHENSIVE METABOLIC PANEL - Abnormal; Notable for the following components:   Sodium 131 (*)    Chloride 90 (*)    Glucose, Bld 362 (*)    BUN 68 (*)    Creatinine, Ser 2.26 (*)    Albumin 2.5 (*)    Total Bilirubin 1.7 (*)    GFR, Estimated 33 (*)    Anion gap 16 (*)    All other components within normal limits  BRAIN NATRIURETIC PEPTIDE - Abnormal; Notable for the following components:   B Natriuretic Peptide 532.8 (*)    All other components within normal limits  LACTIC ACID, PLASMA - Abnormal; Notable for the following components:   Lactic Acid, Venous 2.4 (*)    All other components within normal limits  I-STAT ARTERIAL BLOOD GAS, ED - Abnormal; Notable for the following components:   pO2, Arterial 69 (*)    Bicarbonate 29.2 (*)    Acid-Base Excess 3.0 (*)    Sodium 132 (*)    Calcium, Ion 1.14 (*)    HCT 57.0 (*)    Hemoglobin 19.4 (*)    All other components within normal limits  TROPONIN I (HIGH SENSITIVITY) - Abnormal; Notable for the following components:   Troponin I (High Sensitivity) 37 (*)    All other components within normal limits  RESP PANEL BY RT-PCR (FLU A&B, COVID) ARPGX2  CULTURE, BLOOD (ROUTINE X 2)  CULTURE,  BLOOD (ROUTINE X 2)  LACTIC ACID, PLASMA  BLOOD GAS, ARTERIAL  TROPONIN I (HIGH SENSITIVITY)    EKG EKG Interpretation  Date/Time:  Saturday January 15 2021 15:26:57 EDT Ventricular Rate:  108 PR Interval:  178 QRS Duration: 128 QT Interval:  357 QTC Calculation: 479 R Axis:   261 Text Interpretation: Sinus tachycardia Probable left atrial enlargement Nonspecific  IVCD with LAD Anterolateral infarct, old ST elevation suggests acute pericarditis Confirmed by Norman Clay ([id number]) on 01/15/2021 6:01:04 PM  Radiology DG Chest Port 1 View  Result Date: 01/15/2021 CLINICAL DATA:  Dyspnea. EXAM: PORTABLE CHEST 1 VIEW COMPARISON:  10/16/2020.  Chest CT, 12/17/2020. FINDINGS: Cardiac silhouette is mildly enlarged. No mediastinal or hilar masses. Rounded nodule at the left lung base is unchanged from the prior exams. Remainder of the lungs is clear. No pleural effusion or pneumothorax. Left anterior chest wall sequential pacemaker is stable. Skeletal structures are grossly intact. IMPRESSION: 1. No acute cardiopulmonary disease. 2. Left lower lobe pulmonary nodule is without significant change from the most recent prior exams. 3. Stable cardiomegaly. Electronically Signed   By: Amie [place] M.D.   On: 01/15/2021 16:23    Procedures .Critical Care  Date/Time: 01/15/2021 6:36 PM Performed by: Cheryll Cockayne, MD Authorized by: Cheryll Cockayne, MD   Critical care provider statement:    Critical care time (minutes):  30   Critical care time was exclusive of:  Separately billable procedures and treating other patients and teaching time   Critical care was necessary to treat or prevent imminent or life-threatening deterioration of the following conditions:  Respiratory failure   Medications Ordered in ED Medications  cefTRIAXone (ROCEPHIN) 1 g in sodium chloride 0.9 % 100 mL IVPB (has no administration in time range)  azithromycin (ZITHROMAX) 500 mg in sodium chloride 0.9 % 250 mL IVPB (has no administration in time range)  sodium chloride 0.9 % bolus 1,000 mL (1,000 mLs Intravenous New Bag/Given 01/15/21 1726)  ipratropium-albuterol (DUONEB) 0.5-2.5 (3) MG/3ML nebulizer solution 3 mL (3 mLs Nebulization Given 01/15/21 1740)  albuterol (PROVENTIL) (2.5 MG/3ML) 0.083% nebulizer solution 2.5 mg (2.5 mg Nebulization Given 01/15/21 1822)    ED Course  I have reviewed the  triage vital signs and the nursing notes.  Pertinent labs & imaging results that were available during my care of the patient were reviewed by me and considered in my medical decision making (see chart for details).    MDM Rules/Calculators/A&P                           Labs show white count 22 chemistry unremarkable glucose elevated.  Lactic acid elevated 2.4 as well blood cultures and pending.  COVID test is negative, breathing treatment provided.  Patient received Solu-Medrol 125 mg prior to arrival in the ER.  Chest x-ray shows persistent lower lobe nodule, no new findings per radiology.  Despite multiple treatments, patient remains tachypneic short of breath with increased oxygen demand to 4 L nasal cannula.  Will treat empirically with antibiotics.  Will be brought into the hospitalist team.  Final Clinical Impression(s) / ED Diagnoses Final diagnoses:  SOB (shortness of breath)    Rx / DC Orders ED Discharge Orders     None        Cheryll Cockayne, MD 01/15/21 [phone]

## 2021-01-15 NOTE — Consult Note (Addendum)
NAMEIsabel Freese MRN:  [id number] DOB:  [birth year] LOS: 0 ADMISSION DATE:  01/15/2021  CONSULTATION DATE:  01/15/2021 REFERRING MD:  Dr. Norman Clay  REASON FOR CONSULTATION:  increasing oxygen requirement   Initial Pulmonary/Critical Care Consultation  Brief History   N/A  History of present illness   This 60 y.o. African American male is seen in consultation at the request of Dr. Tobi Bastos for recommendations on further evaluation and management of hypoxia.  The patient presented to [facility] Emergency Department on 7/23 via EMS with complaints of increasing dyspnea.  He reports that he has COPD and CHF and is on long-term oxygen therapy @ 2 LPM via nasal cannula. The patient was admitted to the hospitalist service.  At the time of clinical interview, the patient has been started on BiPAP 8/4, FiO2 40%.  He is awake and irritated because he does not want to wear the BiPAP mask.   The patient states that he has been instructed to weigh daily.  He knows that his target dry weight is 265 lbs but also reports that he has been weighing as much as 280 lbs.  He typically uses 2 L nasal cannula at home but has had to increase that to about 4 LPM.  Complaining of generalized body aches.  Denies specific chest pain or abdominal pain.  Denies any fevers.  Has an unchanged cough.  No vomiting or diarrhea reported.  REVIEW OF SYSTEMS Constitutional: Weight gain.  No night sweats. No fever. No chills. No fatigue. HEENT: No headaches, dysphagia, sore throat, otalgia, nasal congestion, PND CV:  Orthopnea.  No chest pain, PND, swelling in lower extremities, palpitations GI:  No abdominal pain, nausea, vomiting, diarrhea, change in bowel pattern, anorexia Resp: Exertional dyspnea.  No rest dyspnea, cough, mucus, hemoptysis, wheezing  GU: Polyuria attributed to diuretic therapy.  No dysuria, change in color of urine, no urgency or frequency.  No flank pain. MS:  No joint pain or swelling. No myalgias,   No decreased range of motion.  Psych:  No change in mood or affect. No memory loss. Skin: L diabetic foot ulcer.   Past Medical/Surgical/Social/Family History   Past Medical History:  Diagnosis Date   AICD (automatic cardioverter/defibrillator) present 01/11/2017   Anxiety    CHF (congestive heart failure) (HCC)    CKD (chronic kidney disease)    COPD (chronic obstructive pulmonary disease) (HCC)    never smoker, industrial exposure   Diabetes mellitus with diabetic neuropathy, with long-term current use of insulin (HCC)    GERD (gastroesophageal reflux disease)    High cholesterol    History of gout    "take RX qd" (11/01/2017)   Hypertension    Nonobstructive atherosclerosis of coronary artery    On home oxygen therapy    "2L prn" (11/01/2017)   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HCC)    LLL, present for years   Systolic HF (heart failure) (HCC)     Past Surgical History:  Procedure Laterality Date   CATARACT EXTRACTION W/ INTRAOCULAR LENS IMPLANTW/ TRABECULECTOMY Left ~ 2013   "may have done this when I had my other eye OR"   CIRCUMCISION     CORONARY STENT INTERVENTION N/A 11/02/2017   Procedure: CORONARY STENT INTERVENTION;  Surgeon: Laurey Morale, MD;  Location: [facility] INVASIVE CV LAB;  Service: Cardiovascular;  Laterality: N/A;   CORONARY STENT INTERVENTION N/A 11/02/2017   Procedure: CORONARY STENT INTERVENTION;  Surgeon: Lyn Records, MD;  Location: [facility] INVASIVE  CV LAB;  Service: Cardiovascular;  Laterality: N/A;   EYE SURGERY Left ~ 2013   "fell on something in basement; stuck in my eye"   ICD IMPLANT     Medtronic Dual Chamber 01/11/17   MULTIPLE TOOTH EXTRACTIONS     "pulled all my top teeth"   RIGHT/LEFT HEART CATH AND CORONARY ANGIOGRAPHY N/A 11/02/2017   Procedure: RIGHT/LEFT HEART CATH AND CORONARY ANGIOGRAPHY;  Surgeon: Laurey Morale, MD;  Location: [facility] INVASIVE CV LAB;  Service: Cardiovascular;  Laterality: N/A;    Social History   Tobacco Use   Smoking status:  Former    Types: Cigarettes   Smokeless tobacco: Never   Tobacco comments:    "smoked when I drank"  Substance Use Topics   Alcohol use: Not Currently    Comment: used to drink multiple cases of beer daily, quit 2018    Family History  Problem Relation Age of Onset   Hypertension Mother    Diabetes Mother    Hypertension Father    Diabetes Father    Diabetes Sister    Diabetes Brother      Significant Hospital Events      Consults:  PCCM   Procedures:     Significant Diagnostic Tests:     Micro Data:   Results for orders placed or performed during the hospital encounter of 01/15/21  Resp Panel by RT-PCR (Flu A&B, Covid) Nasopharyngeal Swab     Status: None   Collection Time: 01/15/21  3:45 PM   Specimen: Nasopharyngeal Swab; Nasopharyngeal(NP) swabs in vial transport medium  Result Value Ref Range Status   SARS Coronavirus 2 by RT PCR NEGATIVE NEGATIVE Final    Comment: (NOTE) SARS-CoV-2 target nucleic acids are NOT DETECTED.  The SARS-CoV-2 RNA is generally detectable in upper respiratory specimens during the acute phase of infection. The lowest concentration of SARS-CoV-2 viral copies this assay can detect is 138 copies/mL. A negative result does not preclude SARS-Cov-2 infection and should not be used as the sole basis for treatment or other patient management decisions. A negative result may occur with  improper specimen collection/handling, submission of specimen other than nasopharyngeal swab, presence of viral mutation(s) within the areas targeted by this assay, and inadequate number of viral copies(<138 copies/mL). A negative result must be combined with clinical observations, patient history, and epidemiological information. The expected result is Negative.  Fact Sheet for Patients:  BloggerCourse.com  Fact Sheet for Healthcare Providers:  SeriousBroker.it  This test is no t yet approved or  cleared by the [place] FDA and  has been authorized for detection and/or diagnosis of SARS-CoV-2 by FDA under an Emergency Use Authorization (EUA). This EUA will remain  in effect (meaning this test can be used) for the duration of the COVID-19 declaration under Section 564(b)(1) of the Act, 21 U.S.C.section 360bbb-3(b)(1), unless the authorization is terminated  or revoked sooner.       Influenza A by PCR NEGATIVE NEGATIVE Final   Influenza B by PCR NEGATIVE NEGATIVE Final    Comment: (NOTE) The Xpert Xpress SARS-CoV-2/FLU/RSV plus assay is intended as an aid in the diagnosis of influenza from Nasopharyngeal swab specimens and should not be used as a sole basis for treatment. Nasal washings and aspirates are unacceptable for Xpert Xpress SARS-CoV-2/FLU/RSV testing.  Fact Sheet for Patients: BloggerCourse.com  Fact Sheet for Healthcare Providers: SeriousBroker.it  This test is not yet approved or cleared by the [place] FDA and has been authorized for detection and/or diagnosis  of SARS-CoV-2 by FDA under an Emergency Use Authorization (EUA). This EUA will remain in effect (meaning this test can be used) for the duration of the COVID-19 declaration under Section 564(b)(1) of the Act, 21 U.S.C. section 360bbb-3(b)(1), unless the authorization is terminated or revoked.  Performed at [facility], [address] [place], [place] [address]       Antimicrobials:  Rocephin/azithromycin (7/23>>)   Interim history/subjective:  N/A   Objective   BP 99/76   Pulse 85   Temp 98.6 F (37 C) (Oral)   Resp 18   SpO2 96%     There were no vitals filed for this visit. No intake or output data in the 24 hours ending 01/15/21 2144    Examination: GENERAL:  alert, oriented to time, person and place, pleasant, well-developed. No acute distress. HEAD: normocephalic, atraumatic EYE: PERRLA, EOM intact, no scleral  icterus, no pallor. THROAT/ORAL CAVITY: Normal dentition. No oral thrush. No exudate. Mucous membranes are moist. No tonsillar enlargement. Mallampati class IV (only hard palate visible) airway. NECK: supple, no thyromegaly, no JVD, no lymphadenopathy. Trachea midline. CHEST/LUNG: symmetric in development and expansion. Diminished air entry in the bases.  Bibasilar crackles.  No wheezes. HEART: Regular S1 and S2 without murmur, rub or gallop. ABDOMEN: soft, nontender, nondistended. Normoactive bowel sounds. No rebound. No guarding. No hepatosplenomegaly. EXTREMITIES: Edema: Trace. No cyanosis. Clubbing. 2+ DP pulses LYMPHATIC: no cervical/axillary/inguinal lymph nodes appreciated MUSCULOSKELETAL: No point tenderness. No bulk atrophy.  SKIN:  L diabetic foot, bandaged. NEUROLOGIC: Doll's eyes intact. Corneal reflex intact. Spontaneous respirations intact. Cranial nerves II-XII are grossly symmetric and physiologic. Babinski absent. Motor: 5/5 @ RUE, 5/5 @ LUE, 5/5 @ RLL,  5/5 @ LLL.  DTR: 2+ @ R biceps, 2+ @ L biceps, 2+ @ R patellar,  2+ @ L patellar. No cerebellar signs. Gait was not assessed.   Resolved Hospital Problem list      Assessment & Plan:   ASSESSMENT/PLAN:  ASSESSMENT (included in the Hospital Problem List)  Principal Problem:   Acute on chronic respiratory failure with hypoxia and hypercapnia (HCC) Active Problems:   Chronic systolic CHF (congestive heart failure) (HCC)   CKD (chronic kidney disease)   Acute renal failure superimposed on stage 3 chronic kidney disease (HCC)   Elevated troponin   Hypertension   COPD exacerbation (HCC)   Hyponatremia   ICD (implantable cardioverter-defibrillator) in place   GERD (gastroesophageal reflux disease)   Diabetes mellitus with diabetic neuropathy, with long-term current use of insulin (HCC)   By systems: PULMONARY Acute hypercapnic respiratory failure Acute pulmonary edema COPD Patient tolerates (and has larger VT) on  CPAP than BiPAP (due to less fighting).  CPAP 4 PRN. Repeat ABG in 1 hour. Titrate supplemental oxygen to maintain SpO2 90-93%. Furosemide 40 mg IV single dose now.  Given his baseline CKD, may need a repeat dose in an hour. DuoNeb. I doubt that this patient is presenting with a bona fide COPD exacerbation.  Rather, I suspect he is having increased airway resistance from edema and would benefit most from diuresis.   CARDIOVASCULAR HFrEF/dilated cardiomyopathy Check BNP Cautious diuresis Cardiology consultation is advised.   RENAL Acute on chronic stage III kidney disease Avoid nephrotoxic drugs. Renal dosing of medications. Monitor urine output.   GASTROINTESTINAL GI PROPHYLAXIS: per primary team   HEMATOLOGIC Leukocytosis DVT PROPHYLAXIS: per primary team   INFECTIOUS L diabetic foot Continue Rocephin/azithromycin Management per primary team   ENDOCRINE Type 2 diabetes mellitus with  renal, vascular and neurolgic complications Management per primary team   NEUROLOGIC Diabetic neuropathy Management per primary team   PLAN/RECOMMENDATIONS  No need for ICU Daily weights Diurese Room air ABG prior to discharge to assess need for home ventilator system.    My assessment, plan of care, findings, medications, side effects, etc. were discussed with: nurse, respiratory therapist, and patient (answered all questions to patient's satisfaction).   Best practice:  Diet: per primary team Pain/Anxiety/Delirium protocol (if indicated): N/A VAP protocol (if indicated): N/A DVT prophylaxis: per primary team GI prophylaxis: per primary team Glucose control: per primary team Mobility/Activity: per primary team Family Communication:   no family at bedside Disposition: per primary team.  No need for ICU at this time.   Labs   CBC: Recent Labs  Lab 01/15/21 1545 01/15/21 1716 01/15/21 2045  WBC 22.9*  --   --   NEUTROABS 18.9*  --   --   HGB 17.8* 19.4* 19.0*  HCT  53.8* 57.0* 56.0*  MCV 87.8  --   --   PLT 276  --   --     Basic Metabolic Panel: Recent Labs  Lab 01/15/21 1545 01/15/21 1716 01/15/21 2045  NA 131* 132* 134*  K 4.8 4.6 4.9  CL 90*  --   --   CO2 25  --   --   GLUCOSE 362*  --   --   BUN 68*  --   --   CREATININE 2.26*  --   --   CALCIUM 8.9  --   --    GFR: CrCl cannot be calculated (Unknown ideal weight.). Recent Labs  Lab 01/15/21 1545 01/15/21 1805  WBC 22.9*  --   LATICACIDVEN 2.4* 2.1*    Liver Function Tests: Recent Labs  Lab 01/15/21 1545  AST 23  ALT 22  ALKPHOS 73  BILITOT 1.7*  PROT 8.1  ALBUMIN 2.5*   No results for input(s): LIPASE, AMYLASE in the last 168 hours. No results for input(s): AMMONIA in the last 168 hours.  ABG    Component Value Date/Time   PHART 7.304 (L) 01/15/2021 2045   PCO2ART 59.2 (H) 01/15/2021 2045   PO2ART 90 01/15/2021 2045   HCO3 29.4 (H) 01/15/2021 2045   TCO2 31 01/15/2021 2045   O2SAT 96.0 01/15/2021 2045     Coagulation Profile: No results for input(s): INR, PROTIME in the last 168 hours.  Cardiac Enzymes: No results for input(s): CKTOTAL, CKMB, CKMBINDEX, TROPONINI in the last 168 hours.  HbA1C: Hgb A1c MFr Bld  Date/Time Value Ref Range Status  10/31/2017 04:20 PM 10.4 (H) 4.8 - 5.6 % Final    Comment:    (NOTE) Pre diabetes:          5.7%-6.4% Diabetes:              >6.4% Glycemic control for   <7.0% adults with diabetes     CBG: No results for input(s): GLUCAP in the last 168 hours.   Review of Systems:   See above   Past Medical History   Past Medical History:  Diagnosis Date   AICD (automatic cardioverter/defibrillator) present 01/11/2017   Anxiety    CHF (congestive heart failure) (HCC)    CKD (chronic kidney disease)    COPD (chronic obstructive pulmonary disease) (HCC)    never smoker, industrial exposure   Diabetes mellitus with diabetic neuropathy, with long-term current use of insulin (HCC)    GERD (gastroesophageal  reflux disease)  High cholesterol    History of gout    "take RX qd" (11/01/2017)   Hypertension    Nonobstructive atherosclerosis of coronary artery    On home oxygen therapy    "2L prn" (11/01/2017)   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HCC)    LLL, present for years   Systolic HF (heart failure) (HCC)       Surgical History    Past Surgical History:  Procedure Laterality Date   CATARACT EXTRACTION W/ INTRAOCULAR LENS IMPLANTW/ TRABECULECTOMY Left ~ 2013   "may have done this when I had my other eye OR"   CIRCUMCISION     CORONARY STENT INTERVENTION N/A 11/02/2017   Procedure: CORONARY STENT INTERVENTION;  Surgeon: Laurey Morale, MD;  Location: [facility] INVASIVE CV LAB;  Service: Cardiovascular;  Laterality: N/A;   CORONARY STENT INTERVENTION N/A 11/02/2017   Procedure: CORONARY STENT INTERVENTION;  Surgeon: Lyn Records, MD;  Location: [facility] INVASIVE CV LAB;  Service: Cardiovascular;  Laterality: N/A;   EYE SURGERY Left ~ 2013   "fell on something in basement; stuck in my eye"   ICD IMPLANT     Medtronic Dual Chamber 01/11/17   MULTIPLE TOOTH EXTRACTIONS     "pulled all my top teeth"   RIGHT/LEFT HEART CATH AND CORONARY ANGIOGRAPHY N/A 11/02/2017   Procedure: RIGHT/LEFT HEART CATH AND CORONARY ANGIOGRAPHY;  Surgeon: Laurey Morale, MD;  Location: [facility] INVASIVE CV LAB;  Service: Cardiovascular;  Laterality: N/A;      Social History   Social History   Socioeconomic History   Marital status: Divorced    Spouse name: Not on file   Number of children: Not on file   Years of education: Not on file   Highest education level: Not on file  Occupational History   Occupation: disability    Comment: stopped working in 2000 d/t occupational exposures  Tobacco Use   Smoking status: Former    Types: Cigarettes   Smokeless tobacco: Never   Tobacco comments:    "smoked when I drank"  Vaping Use   Vaping Use: Never used  Substance and Sexual Activity   Alcohol use: Not Currently    Comment:  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  Other Topics Concern   Not on file  Social History Narrative   Patient lives at home with wife and some of his 10 children. He self-administers his own medications.   Social Determinants of Health   Financial Resource Strain: Low Risk    Difficulty of Paying Living Expenses: Not very hard  Food Insecurity: No Food Insecurity   Worried About Programme researcher, broadcasting/film/video in the Last Year: Never true   Ran Out of Food in the Last Year: Never true  Transportation Needs: No Transportation Needs   Lack of Transportation (Medical): No   Lack of Transportation (Non-Medical): No  Physical Activity: Not on file  Stress: Not on file  Social Connections: Not on file      Family History    Family History  Problem Relation Age of Onset   Hypertension Mother    Diabetes Mother    Hypertension Father    Diabetes Father    Diabetes Sister    Diabetes Brother    family history includes Diabetes in his brother, father, mother, and sister; Hypertension in his father and mother.    Allergies Allergies  Allergen Reactions   Aspirin Itching  Codeine    Coconut Oil Itching      Current Medications  Current Facility-Administered Medications:    [START ON 01/16/2021] azithromycin (ZITHROMAX) 500 mg in sodium chloride 0.9 % 250 mL IVPB, 500 mg, Intravenous, Q24H, Doutova, Anastassia, MD   [START ON 01/16/2021] cefTRIAXone (ROCEPHIN) 2 g in sodium chloride 0.9 % 100 mL IVPB, 2 g, Intravenous, Q24H, Doutova, Anastassia, MD   insulin aspart (novoLOG) injection 0-9 Units, 0-9 Units, Subcutaneous, Q4H, Doutova, Anastassia, MD   pantoprazole (PROTONIX) injection 40 mg, 40 mg, Intravenous, QHS, Doutova, Anastassia, MD  Current Outpatient Medications:    acetaminophen (TYLENOL) 650 MG CR tablet, Take 650 mg by mouth every 8 (eight) hours as needed for pain., Disp: , Rfl:    albuterol (VENTOLIN HFA)  108 (90 Base) MCG/ACT inhaler, Inhale 1-2 puffs into the lungs every 4 (four) hours as needed for wheezing or shortness of breath., Disp: 8 g, Rfl: 1   allopurinol (ZYLOPRIM) 100 MG tablet, Take 1 tablet (100 mg total) by mouth daily. (Patient not taking: Reported on 12/07/2020), Disp: 30 tablet, Rfl: 3   aspirin 81 MG chewable tablet, Chew 81 mg by mouth daily., Disp: , Rfl:    atorvastatin (LIPITOR) 40 MG tablet, TAKE ONE TABLET BY MOUTH DAILY AT 6PM, Disp: 90 tablet, Rfl: 0   bisoprolol (ZEBETA) 5 MG tablet, Take 2.5 mg by mouth daily., Disp: , Rfl:    digoxin (LANOXIN) 0.125 MG tablet, TAKE 1 TABLET(0.125 MG) BY MOUTH DAILY, Disp: 30 tablet, Rfl: 6   docusate sodium (COLACE) 100 MG capsule, Take 100 mg by mouth daily as needed for mild constipation., Disp: , Rfl:    DULoxetine (CYMBALTA) 60 MG capsule, Take 60 mg by mouth daily., Disp: , Rfl: 2   eplerenone (INSPRA) 50 MG tablet, Take 1 tablet (50 mg total) by mouth daily., Disp: 30 tablet, Rfl: 6   fenofibrate (TRICOR) 145 MG tablet, TAKE ONE TABLET BY MOUTH DAILY, Disp: 30 tablet, Rfl: 1   hydrALAZINE (APRESOLINE) 50 MG tablet, Take 1.5 tablets (75 mg total) by mouth 3 (three) times daily., Disp: 135 tablet, Rfl: 3   icosapent Ethyl (VASCEPA) 1 g capsule, Take 2 capsules (2 g total) by mouth 2 (two) times daily. (Patient not taking: No sig reported), Disp: 120 capsule, Rfl: 11   Insulin Pen Needle (NOVOFINE) 30G X 8 MM MISC, Inject 10 each into the skin as needed., Disp: 100 each, Rfl: 0   insulin regular human CONCENTRATED (HUMULIN R) 500 UNIT/ML kwikpen, Inject 0-400 Units into the skin as directed. Sliding, Disp: , Rfl:    isosorbide dinitrate (ISORDIL) 20 MG tablet, TAKE TWO TABLETS BY MOUTH THREE TIMES A DAY, Disp: 90 tablet, Rfl: 2   JARDIANCE 25 MG TABS tablet, Take 25 mg by mouth daily., Disp: 30 tablet, Rfl: 6   MAGNESIUM-OXIDE 400 (241.3 Mg) MG tablet, Take 1 tablet (400 mg total) by mouth daily., Disp: 30 tablet, Rfl: 11   omeprazole  (PRILOSEC) 20 MG capsule, TAKE ONE CAPSULE BY MOUTH DAILY (Patient not taking: No sig reported), Disp: 30 capsule, Rfl: 0   pregabalin (LYRICA) 150 MG capsule, Take 150 mg by mouth 3 (three) times daily., Disp: , Rfl:    sacubitril-valsartan (ENTRESTO) 97-103 MG, Take 1 tablet by mouth 2 (two) times daily., Disp: 180 tablet, Rfl: 3   Torsemide 40 MG TABS, Take 80 mg by mouth in the morning AND 60 mg every evening., Disp: 105 tablet, Rfl: 3  Home Medications  Prior to Admission  medications   Medication Sig Start Date End Date Taking? Authorizing Provider  acetaminophen (TYLENOL) 650 MG CR tablet Take 650 mg by mouth every 8 (eight) hours as needed for pain.    [provider]  albuterol (VENTOLIN HFA) 108 (90 Base) MCG/ACT inhaler Inhale 1-2 puffs into the lungs every 4 (four) hours as needed for wheezing or shortness of breath. 10/17/20   Alessandra Bevels, MD  allopurinol (ZYLOPRIM) 100 MG tablet Take 1 tablet (100 mg total) by mouth daily. Patient not taking: Reported on 12/07/2020 12/26/17   Tonye Becket D, NP  aspirin 81 MG chewable tablet Chew 81 mg by mouth daily.    [provider]  atorvastatin (LIPITOR) 40 MG tablet TAKE ONE TABLET BY MOUTH DAILY AT [facility] 11/15/20   Laurey Morale, MD  bisoprolol (ZEBETA) 5 MG tablet Take 2.5 mg by mouth daily.    [provider]  digoxin (LANOXIN) 0.125 MG tablet TAKE 1 TABLET(0.125 MG) BY MOUTH DAILY 04/28/20   Laurey Morale, MD  docusate sodium (COLACE) 100 MG capsule Take 100 mg by mouth daily as needed for mild constipation.    [provider]  DULoxetine (CYMBALTA) 60 MG capsule Take 60 mg by mouth daily. 09/28/17   [provider]  eplerenone (INSPRA) 50 MG tablet Take 1 tablet (50 mg total) by mouth daily. 07/27/20   Laurey Morale, MD  fenofibrate (TRICOR) 145 MG tablet TAKE ONE TABLET BY MOUTH DAILY 12/15/20   Laurey Morale, MD  hydrALAZINE (APRESOLINE) 50 MG tablet Take 1.5 tablets (75 mg total) by mouth  3 (three) times daily. 09/09/20   Laurey Morale, MD  icosapent Ethyl (VASCEPA) 1 g capsule Take 2 capsules (2 g total) by mouth 2 (two) times daily. Patient not taking: No sig reported 10/08/20   Laurey Morale, MD  Insulin Pen Needle (NOVOFINE) 30G X 8 MM MISC Inject 10 each into the skin as needed. 11/06/17   Arnetha Courser, MD  insulin regular human CONCENTRATED (HUMULIN R) 500 UNIT/ML kwikpen Inject 0-400 Units into the skin as directed. Sliding 07/18/17   [provider]  isosorbide dinitrate (ISORDIL) 20 MG tablet TAKE TWO TABLETS BY MOUTH THREE TIMES A DAY 12/15/20   Laurey Morale, MD  JARDIANCE 25 MG TABS tablet Take 25 mg by mouth daily. 10/15/19   Laurey Morale, MD  MAGNESIUM-OXIDE 400 (241.3 Mg) MG tablet Take 1 tablet (400 mg total) by mouth daily. 08/27/20   Laurey Morale, MD  omeprazole (PRILOSEC) 20 MG capsule TAKE ONE CAPSULE BY MOUTH DAILY Patient not taking: No sig reported 10/20/20   Laurey Morale, MD  pregabalin (LYRICA) 150 MG capsule Take 150 mg by mouth 3 (three) times daily. 10/07/20   [provider]  sacubitril-valsartan (ENTRESTO) 97-103 MG Take 1 tablet by mouth 2 (two) times daily. 03/17/20   Laurey Morale, MD  Torsemide 40 MG TABS Take 80 mg by mouth in the morning AND 60 mg every evening. 12/07/20   Laurey Morale, MD    Marcelle Smiling, MD Board Certified by the ABIM, Pulmonary Diseases & Critical Care Medicine

## 2021-01-15 NOTE — ED Triage Notes (Addendum)
Pt from home BIB EMS for c/o [facility] that started about a week ago and became worse today. Pt wears 2L [place] at home. Pt reports recently having a procedure in which they "remove some skin" from the bottom of his left foot. Denies CP at this time. Pt reports have generalized pain "everywhere." Sp02 78% on RA with EMS. 4L [place] applied EMS gave 1 Neb, 125mg  Solumedrol IV and 4mg  Zofran IV.

## 2021-01-15 NOTE — H&P (Signed)
Richard [name] NLZ:[id number] DOB: [birth year] DOA: 01/15/2021     PCP: Wallene Dales, MD   Outpatient Specialists:  CARDS:  Dr. Welton Flakes, Stephannie Li, MD  [facility]   Pulmonary Greggory Stallion Oneal Grout, PA-C  [facility]    Patient arrived to ER on 01/15/21 at 1518 Referred by Attending Luna Fuse, MD   Patient coming from: home Lives  With family    Chief Complaint:   Chief Complaint  Patient presents with   Shortness of Breath    HPI: Richard [name] is a 60 y.o. male with medical history significant of     DM2, COPD on chronic O2 2 L, systolic CHF sp AICD, venous stasis ulcer, HTN, CKD, pulmonary nodule (hamartoma)  Presented with   increased SOB no fever, no CP Reports body aches, SOB worsened over past 24h Noted to have  Sp02 78% on RA with EMS. 4L [place] applied EMS gave 1 Neb, 190m Solumedrol IV and 439mZofran IV. Pt remained on 4 L despite repeated neb treatements Hx of severe COPD on O2 former smoker He was [facility] this [facility] for CHF exacerbation requiring aggressive diuresis Chronic leg wounds followed by wound care    Has  been vaccinated against COVID     Initial COVID TEST  NEGATIVE   Lab Results  Component Value Date   SAL[place]7/23/2022   SAC[place]GATIVE 10/16/2020   S[place]GATIVE 08/04/2019     Regarding pertinent Chronic problems:     Hyperlipidemia -  on Tricor lipitor Lipid Panel     Component Value Date/Time   CHOL 98 04/29/2020 1435   TRIG 144 04/29/2020 1435   HDL 27 (L) 04/29/2020 1435   CHOLHDL 3.6 04/29/2020 1435   VLDL 29 04/29/2020 1435   LDLCALC 42 04/29/2020 1435   LDLDIRECT 66.8 10/03/2019 1600   Gout on allopurinol   HTN on Zebeta hydralazine, imdur   chronic CHF diastolic/systolic/ combined - last echo Jan 2022 EF 20-25% Entresto and torsemide    CAD  - On Aspirin, statin, betablocker, Plavix                 -  followed by cardiology                - last cardiac  cath  2019 sp stent     DM 2 -  Lab Results  Component Value Date   HGBA1C 10.4 (H) 10/31/2017   on insulin,     obesity-   BMI Readings from Last 1 Encounters:  12/07/20 32.49 kg/m       COPD -  followed by pulmonology  on baseline oxygen  2L,        CKD stage IIIb- baseline Cr 1.7 CrCl cannot be calculated (Unknown ideal weight.).  Lab Results  Component Value Date   CREATININE 2.26 (H) 01/15/2021   CREATININE 1.72 (H) 12/06/2020   CREATININE 1.59 (H) 11/30/2020    While in ER: Noted to need up to 5L of o2  Initially alert and orient but became more somnolent    ED Triage Vitals  Enc Vitals Group     BP 01/15/21 1526 112/81     Pulse Rate 01/15/21 1530 (!) 107     Resp 01/15/21 1526 (!) 24     Temp 01/15/21 1526 98.6 F (37 C)     Temp Source 01/15/21 1526 Oral     SpO2 01/15/21 1526 94 %  Weight --      Height --      Head Circumference --      Peak Flow --      Pain Score 01/15/21 1531 9     Pain Loc --      Pain Edu? --      Excl. in [place]? --   TMAX(24)@     _________________________________________ Significant initial  Findings: Abnormal Labs Reviewed  CBC WITH DIFFERENTIAL/PLATELET - Abnormal; Notable for the following components:      Result Value   WBC 22.9 (*)    RBC 6.13 (*)    Hemoglobin 17.8 (*)    HCT 53.8 (*)    RDW 19.2 (*)    Neutro Abs 18.9 (*)    Monocytes Absolute 2.7 (*)    Abs Immature Granulocytes 0.30 (*)    All other components within normal limits  COMPREHENSIVE METABOLIC PANEL - Abnormal; Notable for the following components:   Sodium 131 (*)    Chloride 90 (*)    Glucose, Bld 362 (*)    BUN 68 (*)    Creatinine, Ser 2.26 (*)    Albumin 2.5 (*)    Total Bilirubin 1.7 (*)    GFR, Estimated 33 (*)    Anion gap 16 (*)    All other components within normal limits  BRAIN NATRIURETIC PEPTIDE - Abnormal; Notable for the following components:   B Natriuretic Peptide 532.8 (*)    All other components within normal limits   LACTIC ACID, PLASMA - Abnormal; Notable for the following components:   Lactic Acid, Venous 2.4 (*)    All other components within normal limits  LACTIC ACID, PLASMA - Abnormal; Notable for the following components:   Lactic Acid, Venous 2.1 (*)    All other components within normal limits  I-STAT ARTERIAL BLOOD GAS, ED - Abnormal; Notable for the following components:   pO2, Arterial 69 (*)    Bicarbonate 29.2 (*)    Acid-Base Excess 3.0 (*)    Sodium 132 (*)    Calcium, Ion 1.14 (*)    HCT 57.0 (*)    Hemoglobin 19.4 (*)    All other components within normal limits  TROPONIN I (HIGH SENSITIVITY) - Abnormal; Notable for the following components:   Troponin I (High Sensitivity) 37 (*)    All other components within normal limits  TROPONIN I (HIGH SENSITIVITY) - Abnormal; Notable for the following components:   Troponin I (High Sensitivity) 37 (*)    All other components within normal limits   ____________________________________________ Ordered CXR -  NON acute    _________________________ Troponin 37 ECG: Ordered Personally reviewed by me showing: HR : 108 Rhythm: delayed conduction  nonspecific changes,  QTC 471 ____________________ This patient meets SIRS Criteria and may be septic.  The recent clinical data is shown below. Vitals:   01/15/21 1730 01/15/21 1745 01/15/21 1800 01/15/21 1815  BP: 1_0 113/68  Pulse: 95 90 87 89  Resp: (!) 27 (!) 21 (!) 23 (!) 23  Temp:      TempSrc:      SpO2: (!) 84% (!) 84% 92% 95%    WBC     Component Value Date/Time   WBC 22.9 (H) 01/15/2021 1545   LYMPHSABS 0.8 01/15/2021 1545   MONOABS 2.7 (H) 01/15/2021 1545   EOSABS 0.1 01/15/2021 1545   BASOSABS 0.1 01/15/2021 1545     Lactic Acid, Venous    Component Value Date/Time   LATICACIDVEN  2.1 (HH) 01/15/2021 1805     Procalcitonin   Ordered     UA  ordered     Results for orders placed or performed during the hospital encounter of 01/15/21  Resp  Panel by RT-PCR (Flu A&B, Covid) Nasopharyngeal Swab     Status: None   Collection Time: 01/15/21  3:45 PM   Specimen: Nasopharyngeal Swab; Nasopharyngeal(NP) swabs in vial transport medium  Result Value Ref Range Status   SARS Coronavirus 2 by RT PCR NEGATIVE NEGATIVE Final         Influenza A by PCR NEGATIVE NEGATIVE Final   Influenza B by PCR NEGATIVE NEGATIVE Final          _______________________________________________ Hospitalist was called for admission for COPD exacerbation  The following Work up has been ordered so far:  Orders Placed This Encounter  Procedures   Critical Care   Culture, blood (routine x 2)   Resp Panel by RT-PCR (Flu A&B, Covid) Nasopharyngeal Swab   DG Chest Port 1 View   CBC with Differential   Comprehensive metabolic panel   Brain natriuretic peptide   Lactic acid, plasma   Blood gas, arterial   Consult for [facility] Admission   Airborne and Contact precautions   I-Stat arterial blood gas, ED   EKG 12-Lead    Following Medications were ordered in ER: Medications  cefTRIAXone (ROCEPHIN) 1 g in sodium chloride 0.9 % 100 mL IVPB (1 g Intravenous New Bag/Given 01/15/21 1843)  azithromycin (ZITHROMAX) 500 mg in sodium chloride 0.9 % 250 mL IVPB (has no administration in time range)  sodium chloride 0.9 % bolus 1,000 mL (1,000 mLs Intravenous New Bag/Given 01/15/21 1726)  ipratropium-albuterol (DUONEB) 0.5-2.5 (3) MG/3ML nebulizer solution 3 mL (3 mLs Nebulization Given 01/15/21 1740)  albuterol (PROVENTIL) (2.5 MG/3ML) 0.083% nebulizer solution 2.5 mg (2.5 mg Nebulization Given 01/15/21 1822)      OTHER Significant initial  Findings:  labs showing:    Recent Labs  Lab 01/15/21 1545 01/15/21 1716  NA 131* 132*  K 4.8 4.6  CO2 25  --   GLUCOSE 362*  --   BUN 68*  --   CREATININE 2.26*  --   CALCIUM 8.9  --     Cr Up from baseline see below Lab Results  Component Value Date   CREATININE 2.26 (H) 01/15/2021   CREATININE 1.72 (H)  12/06/2020   CREATININE 1.59 (H) 11/30/2020    Recent Labs  Lab 01/15/21 1545  AST 23  ALT 22  ALKPHOS 73  BILITOT 1.7*  PROT 8.1  ALBUMIN 2.5*   Lab Results  Component Value Date   CALCIUM 8.9 01/15/2021   PHOS 3.8 10/18/2020          Plt: Lab Results  Component Value Date   PLT 276 01/15/2021      ABG    Component Value Date/Time   PHART 7.407 01/15/2021 1716   PCO2ART 46.4 01/15/2021 1716   PO2ART 69 (L) 01/15/2021 1716   HCO3 29.2 (H) 01/15/2021 1716   TCO2 31 01/15/2021 1716   O2SAT 93.0 01/15/2021 1716       Recent Labs  Lab 01/15/21 1545 01/15/21 1716  WBC 22.9*  --   NEUTROABS 18.9*  --   HGB 17.8* 19.4*  HCT 53.8* 57.0*  MCV 87.8  --   PLT 276  --     HG/HCT  Up from baseline see below    Component Value Date/Time   HGB 19.4 (H)  01/15/2021 1716   HCT 57.0 (H) 01/15/2021 1716   MCV 87.8 01/15/2021 1545       Cardiac Panel (last 3 results) No results for input(s): CKTOTAL, CKMB, TROPONINI, RELINDX in the last 72 hours.   BNP (last 3 results) Recent Labs    10/16/20 1810 11/09/20 1519 01/15/21 1545  BNP 329.7* 373.4* 532.8*      DM  labs:  HbA1C: No results for input(s): HGBA1C in the last 8760 hours.     CBG (last 3)  No results for input(s): GLUCAP in the last 72 hours.    Cultures:    Component Value Date/Time   SDES  04/22/2019 1359    URINE, RANDOM Performed at [facility], [address] [place], [place] [address]    [facility]  04/22/2019 1359    NONE Performed at [facility], [address] [place], [place] [address]    CULT (A) 04/22/2019 1359    <10,000 COLONIES/mL INSIGNIFICANT GROWTH Performed at [facility] [address], [place], [place] [address]    REPTSTATUS 04/23/2019 FINAL 04/22/2019 1359     Radiological Exams on Admission: DG Chest Port 1 View  Result Date: 01/15/2021 CLINICAL DATA:  Dyspnea. EXAM: PORTABLE CHEST 1 VIEW COMPARISON:  10/16/2020.  Chest CT,  12/17/2020. FINDINGS: Cardiac silhouette is mildly enlarged. No mediastinal or hilar masses. Rounded nodule at the left lung base is unchanged from the prior exams. Remainder of the lungs is clear. No pleural effusion or pneumothorax. Left anterior chest wall sequential pacemaker is stable. Skeletal structures are grossly intact. IMPRESSION: 1. No acute cardiopulmonary disease. 2. Left lower lobe pulmonary nodule is without significant change from the most recent prior exams. 3. Stable cardiomegaly. Electronically Signed   By: Lajean Manes M.D.   On: 01/15/2021 16:23   _______________________________________________________________________________________________________ Latest  Blood pressure 113/68, pulse 89, temperature 98.6 F (37 C), temperature source Oral, resp. rate (!) 23, SpO2 95 %.   Review of Systems:    Pertinent positives include:  shortness of breath at rest.   dyspnea on exertion non-productive cough,   Constitutional:  No weight loss, night sweats, Fevers, chills, fatigue, weight loss  HEENT:  No headaches, Difficulty swallowing,Tooth/dental problems,Sore throat,  No sneezing, itching, ear ache, nasal congestion, post nasal drip,  Cardio-vascular:  No chest pain, Orthopnea, PND, anasarca, dizziness, palpitations.no Bilateral lower extremity swelling  GI:  No heartburn, indigestion, abdominal pain, nausea, vomiting, diarrhea, change in bowel habits, loss of appetite, melena, blood in stool, hematemesis Resp:  no , No excess mucus, no productive cough, No No coughing up of blood.No change in color of mucus.No wheezing. Skin:  no rash or lesions. No jaundice GU:  no dysuria, change in color of urine, no urgency or frequency. No straining to urinate.  No flank pain.  Musculoskeletal:  No joint pain or no joint swelling. No decreased range of motion. No back pain.  Psych:  No change in mood or affect. No depression or anxiety. No memory loss.  Neuro: no localizing  neurological complaints, no tingling, no weakness, no double vision, no gait abnormality, no slurred speech, no confusion  All systems reviewed and apart from [place] all are negative _______________________________________________________________________________________________ Past Medical History:   Past Medical History:  Diagnosis Date   AICD (automatic cardioverter/defibrillator) present 01/11/2017   Anxiety    CHF (congestive heart failure) (HCC)    CKD (chronic kidney disease)    COPD (chronic obstructive pulmonary disease) ([place])  never smoker, industrial exposure   Diabetes mellitus with diabetic neuropathy, with long-term current use of insulin ([place])    GERD (gastroesophageal reflux disease)    High cholesterol    History of gout    "take RX qd" (11/01/2017)   Hypertension    Nonobstructive atherosclerosis of coronary artery    On home oxygen therapy    "2L prn" (11/01/2017)   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, present for years   Systolic HF (heart failure) ([place])     Past Surgical History:  Procedure Laterality Date   CATARACT EXTRACTION W/ INTRAOCULAR LENS IMPLANTW/ TRABECULECTOMY Left ~ 2013   "may have done this when I had my other eye OR"   [place] N/A 11/02/2017   Procedure: CORONARY STENT INTERVENTION;  Surgeon: Larey Dresser, MD;  Location: [facility];  Service: Cardiovascular;  Laterality: N/A;   CORONARY STENT INTERVENTION N/A 11/02/2017   Procedure: CORONARY STENT INTERVENTION;  Surgeon: Belva Crome, MD;  Location: [facility];  Service: Cardiovascular;  Laterality: N/A;   EYE SURGERY Left ~ 2013   "fell on something in basement; stuck in my eye"   ICD IMPLANT     Medtronic Dual Chamber 01/11/17   MULTIPLE TOOTH EXTRACTIONS     "pulled all my top teeth"   RIGHT/LEFT HEART CATH AND CORONARY ANGIOGRAPHY N/A 11/02/2017   Procedure: RIGHT/LEFT HEART CATH AND CORONARY ANGIOGRAPHY;  Surgeon: Larey Dresser, MD;  Location: [facility];  Service: Cardiovascular;  Laterality: N/A;    Social History:  Ambulatory  independently        reports that he has quit smoking. His smoking use included cigarettes. He has never used smokeless tobacco. He reports previous alcohol use. He reports that he does not use drugs.     Family History:   Family History  Problem Relation Age of Onset   Hypertension Mother    Diabetes Mother    Hypertension Father    Diabetes Father    Diabetes Sister    Diabetes Brother    ______________________________________________________________________________________________ Allergies: Allergies  Allergen Reactions   Aspirin Itching   Codeine    Coconut Oil Itching     Prior to Admission medications   Medication Sig Start Date End Date Taking? Authorizing Provider  acetaminophen (TYLENOL) 650 MG CR tablet Take 650 mg by mouth every 8 (eight) hours as needed for pain.    [provider]  albuterol (VENTOLIN HFA) 108 (90 Base) MCG/ACT inhaler Inhale 1-2 puffs into the lungs every 4 (four) hours as needed for wheezing or shortness of breath. 10/17/20   [place] Shi, MD  allopurinol (ZYLOPRIM) 100 MG tablet Take 1 tablet (100 mg total) by mouth daily. Patient not taking: Reported on 12/07/2020 12/26/17   Darrick Grinder D, NP  aspirin 81 MG chewable tablet Chew 81 mg by mouth daily.    [provider]  atorvastatin (LIPITOR) 40 MG tablet TAKE ONE TABLET BY MOUTH DAILY AT [facility] 11/15/20   Larey Dresser, MD  bisoprolol (ZEBETA) 5 MG tablet Take 2.5 mg by mouth daily.    [provider]  digoxin (LANOXIN) 0.125 MG tablet TAKE 1 TABLET(0.125 MG) BY MOUTH DAILY 04/28/20   Larey Dresser, MD  docusate sodium (COLACE) 100 MG capsule Take 100 mg by mouth daily as needed for mild constipation.    [provider]  DULoxetine (CYMBALTA) 60 MG capsule Take  60 mg by mouth daily. 09/28/17   [provider]  eplerenone (INSPRA) 50  MG tablet Take 1 tablet (50 mg total) by mouth daily. 07/27/20   Larey Dresser, MD  fenofibrate (TRICOR) 145 MG tablet TAKE ONE TABLET BY MOUTH DAILY 12/15/20   Larey Dresser, MD  hydrALAZINE (APRESOLINE) 50 MG tablet Take 1.5 tablets (75 mg total) by mouth 3 (three) times daily. 09/09/20   Larey Dresser, MD  icosapent Ethyl (VASCEPA) 1 g capsule Take 2 capsules (2 g total) by mouth 2 (two) times daily. Patient not taking: No sig reported 10/08/20   Larey Dresser, MD  Insulin Pen Needle (NOVOFINE) 30G X 8 MM MISC Inject 10 each into the skin as needed. 11/06/17   Lorella Nimrod, MD  insulin regular human CONCENTRATED (HUMULIN R) 500 UNIT/ML kwikpen Inject 0-400 Units into the skin as directed. Sliding 07/18/17   [provider]  isosorbide dinitrate (ISORDIL) 20 MG tablet TAKE TWO TABLETS BY MOUTH THREE TIMES A DAY 12/15/20   Larey Dresser, MD  JARDIANCE 25 MG TABS tablet Take 25 mg by mouth daily. 10/15/19   Larey Dresser, MD  MAGNESIUM-OXIDE 400 (241.3 Mg) MG tablet Take 1 tablet (400 mg total) by mouth daily. 08/27/20   Larey Dresser, MD  omeprazole (PRILOSEC) 20 MG capsule TAKE ONE CAPSULE BY MOUTH DAILY Patient not taking: No sig reported 10/20/20   Larey Dresser, MD  pregabalin (LYRICA) 150 MG capsule Take 150 mg by mouth 3 (three) times daily. 10/07/20   [provider]  sacubitril-valsartan (ENTRESTO) 97-103 MG Take 1 tablet by mouth 2 (two) times daily. 03/17/20   Larey Dresser, MD  Torsemide 40 MG TABS Take 80 mg by mouth in the morning AND 60 mg every evening. 12/07/20   Larey Dresser, MD    ___________________________________________________________________________________________________ Physical Exam: Vitals with BMI 01/15/2021 01/15/2021 01/15/2021  Height - - -  Weight - - -  BMI - - -  Systolic 540 94 91  Diastolic 68 63 60  Pulse 89 87 90     1. General:  in No  Acute distress   Chronically ill  -appearing 2. Psychological: somnolent but    Oriented 3. Head/ENT: Dry Mucous Membranes                          Head Non traumatic, neck supple                       Poor Dentition 4. SKIN:  decreased Skin turgor,  Skin clean Dry and intact no rash 5. Heart: Regular rate and rhythm no  Murmur, no Rub or gallop 6. Lungs:some  wheezes or crackles   7. Abdomen: Soft,  non-tender  distended   obese  bowel sounds present 8. Lower extremities: no clubbing, cyanosis, no  edema, wrapped bilaterally  9. Neurologically Grossly intact, moving all 4 extremities equally  intact 10. MSK: Normal range of motion    Chart has been reviewed  ______________________________________________________________________________________________  Assessment/Plan  60 y.o. male with medical history significant of     DM2, COPD on chronic O2 2 L, systolic CHF sp AICD, venous stasis ulcer, HTN, CKD, pulmonary nodule (hamartoma)    Admitted for COPD exacerbation vs CHF exacerbation  Present on Admission:  COPD exacerbation ([place]) -   Will initiate: Steroid taper  -  Antibiotics rocephin.azithro - Albuterol PRN, - scheduled duoneb,  -  Breo or Dulera at discharge   -  Mucinex.  Titrate O2 to saturation >90%. Follow patients respiratory status.  ABG - evidence of hypoxia no hypercarbia   Given worsening mental status will repeat ABG BiPAP PRN PCCM has been consulted.  Felt the patient was stable for stepdown    Hypertension - given hypotension allow permissive HTN   GERD (gastroesophageal reflux disease) - chronic cont ppi   CKD (chronic kidney disease) -  -chronic avoid nephrotoxic medications such as NSAIDs, Vanco Zosyn combo,  avoid hypotension, continue to follow renal function    Chronic systolic CHF (congestive heart failure) ([place]) -PCCM felt the patient was fluid overload.  Wrote for a dose of Lasix and recommended further diuresis and cardiology consult in AM.  Repeat echogram Troponin stable slightly elevated not rising   ICD (implantable  cardioverter-defibrillator) in place - chronic stable    Acute on chronic respiratory failure with hypoxia ([place]) -  this patient has acute respiratory failure with Hypoxia and Hypercarbia as documented by the presence of following: O2 saturatio< 90% on RA pH <7.35 with pCO2 >50 Likely due to:   CHF exacerbation, COPD exacerbation,  Provide O2 therapy and titrate as needed  Continuous pulse ox   check Pulse ox with ambulation prior to discharge Check ABG on RA prior to dc      SIRS (systemic inflammatory response syndrome) (HCC) - -SIRS criteria met with  elevated white blood cell count,       Component Value Date/Time   WBC 22.9 (H) 01/15/2021 1545   LYMPHSABS 0.8 01/15/2021 1545     RR >20 Today's Vitals   01/15/21 1730 01/15/21 1745 01/15/21 1800 01/15/21 1815  BP: 1_0 113/68  Pulse: 95 90 87 89  Resp: (!) 27 (!) 21 (!) 23 (!) 23  Temp:      TempSrc:      SpO2: (!) 84% (!) 84% 92% 95%  PainSc:        -Most likely source being:  pulmonary,   Patient meeting criteria for Severe sepsis with    evidence of end organ damage/organ dysfunction such as   Acute Kidney Injury with Cr > 2,  Lab Results  Component Value Date   CREATININE 2.26 (H) 01/15/2021   CREATININE 1.72 (H) 12/06/2020    elevated lactic acid >2     Component Value Date/Time   LATICACIDVEN 2.1 (HH) 01/15/2021 1655    acute metabolic encephalopathy    Acute hypoxia requiring new supplemental oxygen, SpO2: 95 % O2 Flow Rate (L/min): 4 L/min    - Obtain serial lactic acid and procalcitonin level.  - Initiated IV antibiotics   - await results of blood and urine culture  - Rehydrated initially PCCM felt was fluid up   Further iv fluids held   7:36 PM   AKI (acute kidney injury) ([place]) obtain urine electrolytes hold Entresto   Elevated troponin -  -no chest pain no EKG changes in the setting of  increased work of breathing and  chronic kidney disease likely due to demand ischemia and poor  clearance, monitor on telemetry and cycle cardiac enzymes to trend.  if continues to rise will need further work-up  DM2-  - Order Sensitive   SSI     -  check TSH and HgA1C  - Hold by mouth medications    Other plan as per orders.  DVT prophylaxis:  hep [place]     Code Status:    Code Status:  Prior FULL CODE   as per patient   I had personally discussed CODE STATUS with patient     Family Communication:   Family not at  Bedside    Disposition Plan:   To home once workup is complete and patient is stable   Following barriers for discharge:                            Electrolytes   white count improving able to transition to PO antibiotics                                  Would benefit from PT/OT eval prior to DC  Ordered                       Consults called: PCCM is aware  Admission status:  ED Disposition     ED Disposition  Admit   Condition  --   [place]: [place] [[id number]]  Level of Care: Progressive [102]  Admit to Progressive based on following criteria: CARDIOVASCULAR & THORACIC of moderate stability with acute coronary syndrome symptoms/low risk myocardial infarction/hypertensive urgency/arrhythmias/heart failure potentially compromising stability and stable post cardiovascular intervention patients.  Admit to Progressive based on following criteria: RESPIRATORY PROBLEMS hypoxemic/hypercapnic respiratory failure that is responsive to NIPPV (BiPAP) or High Flow Nasal Cannula (6-80 lpm). Frequent assessment/intervention, no > Q2 hrs < Q4 hrs, to maintain oxygenation and pulmonary hygiene.  May admit patient to [facility] or [facility] if equivalent level of care is available:: No  Covid Evaluation: Confirmed COVID Negative  Diagnosis: COPD exacerbation [facility]) [[id number]]  Admitting Physician: Toy Baker [[id number]]  Attending Physician: Toy Baker [[id number]]  Estimated length of stay: past midnight tomorrow  Certification:: I  certify this patient will need inpatient services for at least 2 midnights           inpatient     I Expect 2 midnight stay secondary to severity of patient's current illness need for inpatient interventions justified by the following:  hemodynamic instability despite optimal treatment (tachycardia   tachypnea  hypoxia )   Severe lab/radiological/exam abnormalities including:    Leukocytoisis oncreaew lactic acid and extensive comorbidities including:    DM2   CHF   CAD  COPD/asthma    CKD    That are currently affecting medical management.   I expect  patient to be hospitalized for 2 midnights requiring inpatient medical care.  Patient is at high risk for adverse outcome (such as loss of life or disability) if not treated.  Indication for inpatient stay as follows:    New or worsening hypoxia  Need for IV antibiotics, IV fluids,     Level of care Progressive tele indefinitely please discontinue once patient no longer qualifies COVID-19 Labs    Lab Results  Component Value Date   [place] 01/15/2021     Precautions: admitted as   Covid Negative    PPE: Used by the provider:   N95  eye Goggles,  Gloves     Ione [place] 01/15/2021, 11:23 PM    [facility]     after 2 AM please page floor coverage PA If 7AM-7PM, please contact the day team taking care of the patient using [email or web address]   Patient was evaluated in the context of the global COVID-19 pandemic, which necessitated  consideration that the patient might be at risk for infection with the SARS-CoV-2 virus that causes COVID-19. Institutional protocols and algorithms that pertain to the evaluation of patients at risk for COVID-19 are in a state of rapid change based on information released by regulatory bodies including the CDC and federal and state organizations. These policies and algorithms were followed during the patient's care.

## 2021-01-15 NOTE — ED Notes (Signed)
RT placed pt on BIPAP V60 per MD Doulova request with settings of 8/4, 40%, BUR 8. Md made this RT aware she has contacted CCM for consult. MD Ardeth Perfect from CCM at bedside w/RT. MD Ardeth Perfect placed pt on CPAP w/setting of 4 cmH2O w/35%. Pt tolerating well, pt maintaining sats w/out difficulty. Pt respiratory status stable on these settings w/no distress noted. RT will continue to monitor.

## 2021-01-15 NOTE — ED Notes (Signed)
RT obtained ABG on pt with the following results and changes. MD [place] notified of results and orders were placed. RT will continue to monitor.  Results for Richard [name], Richard [name] (MRN [id number]) as of 01/15/2021 20:53  Ref. Range 01/15/2021 20:45  pH, Arterial Latest Ref Range: 7.350 - 7.450  7.304 (L)  pCO2 arterial Latest Ref Range: 32.0 - 48.0 mmHg 59.2 (H)  pO2, Arterial Latest Ref Range: 83.0 - 108.0 mmHg 90  TCO2 Latest Ref Range: 22 - 32 mmol/L 31  Acid-Base Excess Latest Ref Range: 0.0 - 2.0 mmol/L 1.0  Bicarbonate Latest Ref Range: 20.0 - 28.0 mmol/L 29.4 (H)  O2 Saturation Latest Units: % 96.0  Patient temperature Unknown 98.6 F  Collection site Unknown Radial

## 2021-01-15 NOTE — ED Notes (Signed)
Called RT Dr. Algis Downs at bedside pt lethargic requiring increasing stimuli for response. De-stating 77% with a good wave form. RT called informed pt to be placed on BiPap prior to going to floor as pt is currently pending CCM consult. RT on the way

## 2021-01-15 NOTE — ED Notes (Signed)
Updated report given to RN 3E04. RT called informed pt to go to 3E04.  Pending RT for transport.

## 2021-01-15 NOTE — ED Notes (Signed)
RT called for repeat ABG verbal order by admitting provider Dr. Algis Downs.

## 2021-01-16 ENCOUNTER — Inpatient Hospital Stay (HOSPITAL_COMMUNITY): Payer: Medicare Other

## 2021-01-16 ENCOUNTER — Inpatient Hospital Stay: Payer: Self-pay

## 2021-01-16 DIAGNOSIS — J441 Chronic obstructive pulmonary disease with (acute) exacerbation: Secondary | ICD-10-CM | POA: Diagnosis not present

## 2021-01-16 DIAGNOSIS — N179 Acute kidney failure, unspecified: Secondary | ICD-10-CM | POA: Diagnosis not present

## 2021-01-16 DIAGNOSIS — J9622 Acute and chronic respiratory failure with hypercapnia: Secondary | ICD-10-CM | POA: Diagnosis not present

## 2021-01-16 DIAGNOSIS — I5022 Chronic systolic (congestive) heart failure: Secondary | ICD-10-CM | POA: Diagnosis not present

## 2021-01-16 DIAGNOSIS — R000 Tachycardia, unspecified: Secondary | ICD-10-CM | POA: Diagnosis not present

## 2021-01-16 DIAGNOSIS — J9621 Acute and chronic respiratory failure with hypoxia: Secondary | ICD-10-CM | POA: Diagnosis not present

## 2021-01-16 LAB — CBC WITH DIFFERENTIAL/PLATELET
Abs Immature Granulocytes: 0.26 10*3/uL — ABNORMAL HIGH (ref 0.00–0.07)
Basophils Absolute: 0.1 10*3/uL (ref 0.0–0.1)
Basophils Relative: 0 %
Eosinophils Absolute: 0.2 10*3/uL (ref 0.0–0.5)
Eosinophils Relative: 1 %
HCT: 51.3 % (ref 39.0–52.0)
Hemoglobin: 16.5 g/dL (ref 13.0–17.0)
Immature Granulocytes: 1 %
Lymphocytes Relative: 4 %
Lymphs Abs: 1.1 10*3/uL (ref 0.7–4.0)
MCH: 28.7 pg (ref 26.0–34.0)
MCHC: 32.2 g/dL (ref 30.0–36.0)
MCV: 89.2 fL (ref 80.0–100.0)
Monocytes Absolute: 0.8 10*3/uL (ref 0.1–1.0)
Monocytes Relative: 3 %
Neutro Abs: 22.5 10*3/uL — ABNORMAL HIGH (ref 1.7–7.7)
Neutrophils Relative %: 91 %
Platelets: 295 10*3/uL (ref 150–400)
RBC: 5.75 MIL/uL (ref 4.22–5.81)
RDW: 19.3 % — ABNORMAL HIGH (ref 11.5–15.5)
WBC: 24.8 10*3/uL — ABNORMAL HIGH (ref 4.0–10.5)
nRBC: 0 % (ref 0.0–0.2)

## 2021-01-16 LAB — BLOOD CULTURE ID PANEL (REFLEXED) - BCID2

## 2021-01-16 LAB — COMPREHENSIVE METABOLIC PANEL
ALT: 20 U/L (ref 0–44)
AST: 18 U/L (ref 15–41)
Albumin: 2.1 g/dL — ABNORMAL LOW (ref 3.5–5.0)
Alkaline Phosphatase: 78 U/L (ref 38–126)
Anion gap: 15 (ref 5–15)
BUN: 77 mg/dL — ABNORMAL HIGH (ref 6–20)
CO2: 27 mmol/L (ref 22–32)
Calcium: 8.9 mg/dL (ref 8.9–10.3)
Chloride: 93 mmol/L — ABNORMAL LOW (ref 98–111)
Creatinine, Ser: 2.37 mg/dL — ABNORMAL HIGH (ref 0.61–1.24)
GFR, Estimated: 31 mL/min — ABNORMAL LOW (ref >60)
Glucose, Bld: 454 mg/dL — ABNORMAL HIGH (ref 70–99)
Potassium: 5.2 mmol/L — ABNORMAL HIGH (ref 3.5–5.1)
Sodium: 135 mmol/L (ref 135–145)
Total Bilirubin: 1.2 mg/dL (ref 0.3–1.2)
Total Protein: 7.6 g/dL (ref 6.5–8.1)

## 2021-01-16 LAB — URINALYSIS, ROUTINE W REFLEX MICROSCOPIC
Bacteria, UA: NONE SEEN
Bilirubin Urine: NEGATIVE
Glucose, UA: >=500 mg/dL — AB
Hgb urine dipstick: NEGATIVE
Ketones, ur: NEGATIVE mg/dL
Leukocytes,Ua: NEGATIVE
Nitrite: NEGATIVE
Protein, ur: NEGATIVE mg/dL
Specific Gravity, Urine: 1.015 (ref 1.005–1.030)
pH: 5 (ref 5.0–8.0)

## 2021-01-16 LAB — ECHOCARDIOGRAM COMPLETE
AR max vel: 1.94 cm2
AV Area VTI: 1.85 cm2
AV Area mean vel: 1.77 cm2
AV Mean grad: 3 mmHg
AV Peak grad: 5.7 mmHg
Ao pk vel: 1.19 m/s
Area-P 1/2: 4.15 cm2
Height: 77 in
MV VTI: 2.29 cm2
S' Lateral: 6 cm
Weight: 4035.3 oz

## 2021-01-16 LAB — BLOOD GAS, ARTERIAL
Acid-Base Excess: 1.1 mmol/L (ref 0.0–2.0)
Bicarbonate: 26.9 mmol/L (ref 20.0–28.0)
Drawn by: 33099
FIO2: 40
O2 Saturation: 96.5 %
Patient temperature: 37
pCO2 arterial: 57.5 mmHg — ABNORMAL HIGH (ref 32.0–48.0)
pH, Arterial: 7.292 — ABNORMAL LOW (ref 7.350–7.450)
pO2, Arterial: 113 mmHg — ABNORMAL HIGH (ref 83.0–108.0)

## 2021-01-16 LAB — GLUCOSE, CAPILLARY
Glucose-Capillary: 302 mg/dL — ABNORMAL HIGH (ref 70–99)
Glucose-Capillary: 322 mg/dL — ABNORMAL HIGH (ref 70–99)
Glucose-Capillary: 339 mg/dL — ABNORMAL HIGH (ref 70–99)
Glucose-Capillary: 381 mg/dL — ABNORMAL HIGH (ref 70–99)
Glucose-Capillary: 406 mg/dL — ABNORMAL HIGH (ref 70–99)
Glucose-Capillary: 432 mg/dL — ABNORMAL HIGH (ref 70–99)
Glucose-Capillary: 451 mg/dL — ABNORMAL HIGH (ref 70–99)

## 2021-01-16 LAB — PROCALCITONIN: Procalcitonin: 12.4 ng/mL

## 2021-01-16 LAB — MAGNESIUM
Magnesium: 2.5 mg/dL — ABNORMAL HIGH (ref 1.7–2.4)
Magnesium: 2.7 mg/dL — ABNORMAL HIGH (ref 1.7–2.4)

## 2021-01-16 LAB — TSH: TSH: 0.473 u[IU]/mL (ref 0.350–4.500)

## 2021-01-16 LAB — SODIUM, URINE, RANDOM: Sodium, Ur: 33 mmol/L

## 2021-01-16 LAB — OSMOLALITY, URINE: Osmolality, Ur: 476 mOsm/kg (ref 300–900)

## 2021-01-16 LAB — MRSA NEXT GEN BY PCR, NASAL: MRSA by PCR Next Gen: DETECTED — AB

## 2021-01-16 LAB — CK: Total CK: 101 U/L (ref 49–397)

## 2021-01-16 LAB — PHOSPHORUS
Phosphorus: 6.2 mg/dL — ABNORMAL HIGH (ref 2.5–4.6)
Phosphorus: 6.9 mg/dL — ABNORMAL HIGH (ref 2.5–4.6)

## 2021-01-16 LAB — CREATININE, URINE, RANDOM: Creatinine, Urine: 51.49 mg/dL

## 2021-01-16 LAB — PREALBUMIN: Prealbumin: 8.5 mg/dL — ABNORMAL LOW (ref 18–38)

## 2021-01-16 LAB — LACTIC ACID, PLASMA: Lactic Acid, Venous: 2.1 mmol/L (ref 0.5–1.9)

## 2021-01-16 MED ORDER — INSULIN ASPART 100 UNIT/ML IJ SOLN
15.0000 [IU] | Freq: Once | INTRAMUSCULAR | Status: AC
  Administered 2021-01-16: 15 [IU] via SUBCUTANEOUS

## 2021-01-16 MED ORDER — FUROSEMIDE 10 MG/ML IJ SOLN
60.0000 mg | Freq: Two times a day (BID) | INTRAMUSCULAR | Status: DC
  Administered 2021-01-16 – 2021-01-18 (×4): 60 mg via INTRAVENOUS
  Filled 2021-01-16 (×4): qty 6

## 2021-01-16 MED ORDER — INSULIN GLARGINE 100 UNIT/ML ~~LOC~~ SOLN
10.0000 [IU] | Freq: Two times a day (BID) | SUBCUTANEOUS | Status: DC
  Administered 2021-01-16 – 2021-01-27 (×21): 10 [IU] via SUBCUTANEOUS
  Filled 2021-01-16 (×23): qty 0.1

## 2021-01-16 MED ORDER — FUROSEMIDE 10 MG/ML IJ SOLN: 80.0000 mg | Freq: Two times a day (BID) | INTRAMUSCULAR | Status: DC

## 2021-01-16 MED ORDER — VANCOMYCIN HCL 2000 MG/400ML IV SOLN
2000.0000 mg | Freq: Once | INTRAVENOUS | Status: AC
  Administered 2021-01-16: 2000 mg via INTRAVENOUS
  Filled 2021-01-16: qty 400

## 2021-01-16 MED ORDER — ALLOPURINOL 100 MG PO TABS
100.0000 mg | ORAL_TABLET | Freq: Every day | ORAL | Status: DC
  Administered 2021-01-17 – 2021-01-27 (×11): 100 mg via ORAL
  Filled 2021-01-16 (×12): qty 1

## 2021-01-16 MED ORDER — INSULIN ASPART 100 UNIT/ML IJ SOLN: 4.0000 [IU] | Freq: Three times a day (TID) | INTRAMUSCULAR | Status: DC

## 2021-01-16 MED ORDER — ASPIRIN EC 81 MG PO TBEC
81.0000 mg | DELAYED_RELEASE_TABLET | Freq: Every day | ORAL | Status: DC
  Administered 2021-01-17 – 2021-01-27 (×11): 81 mg via ORAL
  Filled 2021-01-16 (×11): qty 1

## 2021-01-16 MED ORDER — INSULIN ASPART 100 UNIT/ML IJ SOLN
0.0000 [IU] | Freq: Every day | INTRAMUSCULAR | Status: DC
Start: 2021-01-16 — End: 2021-01-27
  Administered 2021-01-18: 3 [IU] via SUBCUTANEOUS
  Administered 2021-01-19: 2 [IU] via SUBCUTANEOUS
  Administered 2021-01-20: 4 [IU] via SUBCUTANEOUS
  Administered 2021-01-23: 2 [IU] via SUBCUTANEOUS

## 2021-01-16 MED ORDER — INSULIN GLARGINE 100 UNIT/ML ~~LOC~~ SOLN
10.0000 [IU] | Freq: Every day | SUBCUTANEOUS | Status: DC
  Filled 2021-01-16: qty 0.1

## 2021-01-16 MED ORDER — VANCOMYCIN HCL 1500 MG/300ML IV SOLN
1500.0000 mg | INTRAVENOUS | Status: DC
  Administered 2021-01-17: 1500 mg via INTRAVENOUS
  Filled 2021-01-16 (×2): qty 300

## 2021-01-16 MED ORDER — INSULIN ASPART 100 UNIT/ML IJ SOLN
6.0000 [IU] | Freq: Three times a day (TID) | INTRAMUSCULAR | Status: DC
  Administered 2021-01-16 – 2021-01-17 (×4): 6 [IU] via SUBCUTANEOUS

## 2021-01-16 MED ORDER — INSULIN ASPART 100 UNIT/ML IJ SOLN
0.0000 [IU] | INTRAMUSCULAR | Status: DC
Start: 2021-01-16 — End: 2021-01-18
  Administered 2021-01-16 (×2): 15 [IU] via SUBCUTANEOUS
  Administered 2021-01-17: 11 [IU] via SUBCUTANEOUS
  Administered 2021-01-17: 7 [IU] via SUBCUTANEOUS
  Administered 2021-01-17: 15 [IU] via SUBCUTANEOUS
  Administered 2021-01-17: 11 [IU] via SUBCUTANEOUS

## 2021-01-16 MED ORDER — PERFLUTREN LIPID MICROSPHERE
1.0000 mL | INTRAVENOUS | Status: AC | PRN
  Administered 2021-01-16: 3 mL via INTRAVENOUS
  Filled 2021-01-16: qty 10

## 2021-01-16 MED ORDER — IPRATROPIUM-ALBUTEROL 0.5-2.5 (3) MG/3ML IN SOLN
3.0000 mL | Freq: Three times a day (TID) | RESPIRATORY_TRACT | Status: DC
  Administered 2021-01-17: 3 mL via RESPIRATORY_TRACT
  Filled 2021-01-16: qty 3

## 2021-01-16 MED ORDER — INSULIN ASPART 100 UNIT/ML IJ SOLN
0.0000 [IU] | Freq: Three times a day (TID) | INTRAMUSCULAR | Status: DC
  Administered 2021-01-16: 15 [IU] via SUBCUTANEOUS

## 2021-01-16 NOTE — Consult Note (Signed)
WOC Nurse Consult Note: Reason for Consult:Patient with chronic LE wounds, full thickness wounds on the LEs and two areas on the left heel.  The LE wounds heal and reopen often.  The left heel wounds are improving under the direction and follow up of Dr. Nelda Severe at the [facility] outpatient [facility].  He was last seen in Dr. Levada Dy office on Thursday, 7/22.  I will continue the POC of Dr. Nelda Severe to the extent I am able using our house formulary products that are consistent, albeit not identical.  Given the chronicity of the wounds, this POC will be sufficient until patient can resume the previous POC post discharge. Wound type: venous insufficiency, pressure Pressure Injury POA: Yes Measurement:Per Dr. Levada Dy note on 01/13/21 Wound bed:red, moist. Left plantar wound is filling in, debrided to red tissue on Thursday. Drainage (amount, consistency, odor) small to moderate, serous Periwound:intact, dry Dressing procedure/placement/frequency: I will provide Nursing with guidance for topical care using xeroform to the LE wounds topped with foam. The left heel will be dressed with silver hydrofiber (we do not stock collagen) and topped with foam.  Both LEs are to be wrapped from just below toes to just below knee with and ACE bandage for minimal compression. Wound care will be preformed daily.   Patient will return to the previous POC post discharge as directed by Dr. Nelda Severe at the outpatient [facility] and see him at the next scheduled interval.  WOC nursing team will not follow, but will remain available to this patient, the nursing and medical teams.  Please re-consult if needed. Thanks, Ladona Mow, MSN, RN, GNP, [place]  Pager# [phone]

## 2021-01-16 NOTE — Progress Notes (Signed)
Secure chat with Dr Sharolyn Douglas and Kroeger PA re PICC order and positive blood cx.  New order to cancel PICC , will plan to reorder once repeat blood cx are negative.

## 2021-01-16 NOTE — Progress Notes (Signed)
RT in to collect repeat ABG. Pt awake, able to tell me where he is, and complaining of his mouth being dry. This is the most interaction pt has had with me throughout the night. Will cancel repeat ABG for now since pt is not as lethargic. RT will continue to monitor

## 2021-01-16 NOTE — Progress Notes (Signed)
PHARMACY - PHYSICIAN COMMUNICATION CRITICAL VALUE ALERT - BLOOD CULTURE IDENTIFICATION (BCID)  Richard [name] is an 60 y.o. male who presented to [facility] on 01/15/2021 with a chief complaint of increased dyspnea and hypoxia on home 2L O2.  Concern for COPD vs HF exacerbation.  Pt started on Rocephin + Azithromycin as well as IV Lasix for volume overload.  Assessment:  WBC elevated on admission >20.  Afebrile.  BCID resulted in 1/4 bottles with both strep and staph species.  No resistance identified.  Likely contaminants.  If concerned for actual infection, strep species would be covered by current Rocephin order.  Would need to add Vancomycin to cover for possible staph.  Name of physician (or Provider) Contacted: Dr. Sharolyn Douglas  Current antibiotics: Rocephin 2gm IV q24h, Azithromycin 500mg  IV q24h  Changes to prescribed antibiotics recommended:  MD would like to add Vancomycin at this time to cover possible staph infection. Continue Rocephin + Azithromycin. Repeat BCx ordered.   Results for orders placed or performed during the hospital encounter of 01/15/21  Blood Culture ID Panel (Reflexed) (Collected: 01/15/2021  6:05 PM)  Result Value Ref Range   Enterococcus faecalis NOT DETECTED NOT DETECTED   Enterococcus Faecium NOT DETECTED NOT DETECTED   Listeria monocytogenes NOT DETECTED NOT DETECTED   Staphylococcus species DETECTED (A) NOT DETECTED   Staphylococcus aureus (BCID) NOT DETECTED NOT DETECTED   Staphylococcus epidermidis NOT DETECTED NOT DETECTED   Staphylococcus lugdunensis NOT DETECTED NOT DETECTED   Streptococcus species DETECTED (A) NOT DETECTED   Streptococcus agalactiae NOT DETECTED NOT DETECTED   Streptococcus pneumoniae NOT DETECTED NOT DETECTED   Streptococcus pyogenes NOT DETECTED NOT DETECTED   A.calcoaceticus-baumannii NOT DETECTED NOT DETECTED   Bacteroides fragilis NOT DETECTED NOT DETECTED   Enterobacterales NOT DETECTED NOT DETECTED   Enterobacter cloacae  complex NOT DETECTED NOT DETECTED   Escherichia coli NOT DETECTED NOT DETECTED   Klebsiella aerogenes NOT DETECTED NOT DETECTED   Klebsiella oxytoca NOT DETECTED NOT DETECTED   Klebsiella pneumoniae NOT DETECTED NOT DETECTED   Proteus species NOT DETECTED NOT DETECTED   Salmonella species NOT DETECTED NOT DETECTED   Serratia marcescens NOT DETECTED NOT DETECTED   Haemophilus influenzae NOT DETECTED NOT DETECTED   Neisseria meningitidis NOT DETECTED NOT DETECTED   Pseudomonas aeruginosa NOT DETECTED NOT DETECTED   Stenotrophomonas maltophilia NOT DETECTED NOT DETECTED   Candida albicans NOT DETECTED NOT DETECTED   Candida auris NOT DETECTED NOT DETECTED   Candida glabrata NOT DETECTED NOT DETECTED   Candida krusei NOT DETECTED NOT DETECTED   Candida parapsilosis NOT DETECTED NOT DETECTED   Candida tropicalis NOT DETECTED NOT DETECTED   Cryptococcus neoformans/gattii NOT DETECTED NOT DETECTED    Jordana Dugue, 01/17/2021 01/16/2021  9:56 AM

## 2021-01-16 NOTE — Evaluation (Signed)
Physical Therapy Evaluation Patient Details Name: Richard [name] MRN: [id number] DOB: 02-18-1961 Today's Date: 01/16/2021   History of Present Illness  Pt is a 60 y.o. male who presented 01/15/21 with increased SOB and body aches. Admitted for COPD exacerbation vs CHF exacerbation. Per chart: He was hospitalized at [facility] in April of this year for CHF exacerbation requiring aggressive diuresis. Chronic leg wounds followed by wound care. PMH: DM2, COPD on chronic O2 2 L, systolic CHF sp AICD, venous stasis ulcer, HTN, CKD, pulmonary nodule (hamartoma)   Clinical Impression  Pt presents with condition above and deficits mentioned below, see PT Problem List. PTA, he was independent, driving, and living with his family in a multi-level house (his bedroom and bathroom on main level) with 3 STE with no rails. He reports he would walk long distances outside daily. Currently, pt displays generalized weakness, balance deficits, decreased activity tolerance, and decreased aerobic endurance that place him at risk for falls. Pt also with poor attention span and memory of directions throughout session. Pt would benefit from follow-up with Outpatient PT. Will continue to follow acutely.    Follow Up Recommendations Outpatient PT;Supervision for mobility/OOB    Equipment Recommendations  Rolling walker with 5" wheels    Recommendations for Other Services       Precautions / Restrictions Precautions Precautions: Fall Precaution Comments: monitor vitals Restrictions Weight Bearing Restrictions: No      Mobility  Bed Mobility Overal bed mobility: Needs Assistance Bed Mobility: Supine to Sit;Sit to Supine     Supine to sit: Min guard;HOB elevated Sit to supine: Min guard   General bed mobility comments: Min guard assist for safety with bed mobility, using rails with HOB elevated.    Transfers Overall transfer level: Needs assistance Equipment used: Rolling walker (2  wheeled);None Transfers: Sit to/from Stand Sit to Stand: Mod assist;Min assist         General transfer comment: Initial attempt to stand without AD pt displayed anterior/posterior trunk sway and needed modA to power up and steady with transfer. Returned pt to sit and obtained RW, pt only needing minA to power up and steady, cuing for hand placement.  Ambulation/Gait Ambulation/Gait assistance: Min guard;Min assist Gait Distance (Feet): 35 Feet Assistive device: Rolling walker (2 wheeled) Gait Pattern/deviations: Step-through pattern;Decreased stride length;Trunk flexed;Shuffle Gait velocity: reduced Gait velocity interpretation: 1.31 - 2.62 ft/sec, indicative of limited community ambulator General Gait Details: Pt with slow, short shuffling steps with flexed posture, needing min guard-minA to maintain safety and balance throughout mobility within room. Pt reporting feeling a little dizzy but not worsening with mobility, SpO2 >/= 91% throughout.  Stairs            Wheelchair Mobility    Modified Rankin (Stroke Patients Only)       Balance Overall balance assessment: Needs assistance Sitting-balance support: No upper extremity supported;Feet supported Sitting balance-Leahy Scale: Fair Sitting balance - Comments: Static sitting with supervision.   Standing balance support: Bilateral upper extremity supported;During functional activity Standing balance-Leahy Scale: Poor Standing balance comment: Reliant on UE support for mobility.                             Pertinent Vitals/Pain Pain Assessment: Faces Faces Pain Scale: Hurts a little bit Pain Location: L foot at location of wound Pain Descriptors / Indicators: Guarding Pain Intervention(s): Monitored during session;Limited activity within patient's tolerance;Repositioned    Home Living Family/patient expects to be  discharged to:: Private residence Living Arrangements: Spouse/significant  other;Children Available Help at Discharge: Family;Available PRN/intermittently Type of Home: House Home Access: Stairs to enter Entrance Stairs-Rails: None (wall on R ascending) Entrance Stairs-Number of Steps: 3 Home Layout: Two level;Able to live on main level with bedroom/bathroom;Full bath on main level Home Equipment: None Additional Comments: Pt reports family will not assist him much.    Prior Function Level of Independence: Independent         Comments: Does not work. Drives.     Hand Dominance        Extremity/Trunk Assessment   Upper Extremity Assessment Upper Extremity Assessment: Defer to OT evaluation    Lower Extremity Assessment Lower Extremity Assessment: RLE deficits/detail;LLE deficits/detail;Generalized weakness RLE Deficits / Details: Chronic leg wounds with bandaging noted LLE Deficits / Details: Chronic leg wounds with bandaging noted all around foot    Cervical / Trunk Assessment Cervical / Trunk Assessment: Normal  Communication   Communication: No difficulties  Cognition Arousal/Alertness: Awake/alert Behavior During Therapy: WFL for tasks assessed/performed Overall Cognitive Status: No family/caregiver present to determine baseline cognitive functioning                                 General Comments: Pt difficult to maintain attention at times, needing redirecting often, and forgetting cues. Unsure of his baseline.      General Comments General comments (skin integrity, edema, etc.): SpO2 >/= 91% throughout on 6L O2 via [place]; at rest, HR 80s, RR 22, SpO2 99-100% on 6L, BP 102/83    Exercises     Assessment/Plan    PT Assessment Patient needs continued PT services  PT Problem List Decreased strength;Decreased range of motion;Decreased activity tolerance;Decreased balance;Decreased mobility;Decreased coordination;Decreased cognition;Decreased knowledge of use of DME;Decreased safety awareness;Cardiopulmonary status  limiting activity;Decreased skin integrity       PT Treatment Interventions DME instruction;Gait training;Stair training;Functional mobility training;Therapeutic activities;Therapeutic exercise;Balance training;Neuromuscular re-education;Cognitive remediation;Patient/family education    PT Goals (Current goals can be found in the Care Plan section)  Acute Rehab PT Goals Patient Stated Goal: to get better PT Goal Formulation: With patient Time For Goal Achievement: 01/30/21 Potential to Achieve Goals: Good    Frequency Min 3X/week   Barriers to discharge        Co-evaluation               AM-PAC PT "6 Clicks" Mobility  Outcome Measure Help needed turning from your back to your side while in a flat bed without using bedrails?: A Little Help needed moving from lying on your back to sitting on the side of a flat bed without using bedrails?: A Little Help needed moving to and from a bed to a chair (including a wheelchair)?: A Little Help needed standing up from a chair using your arms (e.g., wheelchair or bedside chair)?: A Little Help needed to walk in hospital room?: A Little Help needed climbing 3-5 steps with a railing? : A Lot 6 Click Score: 17    End of Session Equipment Utilized During Treatment: Gait belt;Oxygen Activity Tolerance: Patient limited by fatigue Patient left: in bed;with call bell/phone within reach;with bed alarm set;with nursing/sitter in room Nurse Communication: Mobility status PT Visit Diagnosis: Unsteadiness on feet (R26.81);Other abnormalities of gait and mobility (R26.89);Muscle weakness (generalized) (M62.81);Difficulty in walking, not elsewhere classified (R26.2)    Time: 4627-0350 PT Time Calculation (min) (ACUTE ONLY): 35 min   Charges:   PT  Evaluation $PT Eval Moderate Complexity: 1 Mod PT Treatments $Therapeutic Activity: 8-22 mins        Raymond Gurney, PT, DPT Acute Rehabilitation Services  Pager: [phone] Office:  [phone]   Jewel Baize 01/16/2021, 2:28 PM

## 2021-01-16 NOTE — Progress Notes (Signed)
Inpatient Diabetes Program Recommendations  AACE/ADA: New Consensus Statement on Inpatient Glycemic Control   Target Ranges:  Prepandial:   less than 140 mg/dL      Peak postprandial:   less than 180 mg/dL (1-2 hours)      Critically ill patients:  140 - 180 mg/dL   Results for OWYN, [name] (MRN [id number]) as of 01/16/2021 11:43  Ref. Range 01/15/2021 23:04 01/16/2021 04:11 01/16/2021 07:26 01/16/2021 08:40 01/16/2021 11:34  Glucose-Capillary Latest Ref Range: 70 - 99 mg/dL 371 (H) 062 (H) 694 (H) 406 (H) 451 (H)    Review of Glycemic Control  Diabetes history: DM2 Outpatient Diabetes medications: Humulin R U500 as directed per sliding scale, Jardiance 25 mg daily Current orders for Inpatient glycemic control: Lantus 10 units QHS, Novolog 0-15 units TID with meals, Novolog 0-5 units QHS; Solumedrol 40 mg Q12H  Inpatient Diabetes Program Recommendations:    Insulin: If steroids are continued, please consider increasing Lantus to 10 units BID, increasing Novolog correction to 0-20 units Q4H, and ordering Novolog 6 units TID with meals for meal coverage if patient eats at least 50% of meals.  NOTE: Noted consult for Diabetes Coordinator. Diabetes Coordinator is not on campus over the weekend but available by pager from 8am to 5pm for questions or concerns. Chart reviewed. Patient admitted with COPDE and CHF. Per chart, patient was inpatient 10/16/20-10/21/20. Noted in Care Everywhere, patient sees Dr. [name] (Endocrinologist) for DM management and was last seen 07/09/20. Per office note on 07/09/20 by Dr. [name], "[name] diabetes is significantly uncontrolled. Patient has not been checking her blood sugars regularly. Patient also has not been taking the insulin regularly. Patient reports that he is having some problems in getting thefreestyle libre and the insulin refills. Patient "advised the patient to restart taking the U5 100 insulin. I advised the patientto continue with Jardiance.". Diabetes  coordinator to follow up with patient on Monday.  Thanks, Richard Penner, RN, MSN, CDE Diabetes Coordinator Inpatient Diabetes Program [phone] (Team Pager from 8am to 5pm)

## 2021-01-16 NOTE — Progress Notes (Signed)
  Echocardiogram 2D Echocardiogram has been performed.  Carolyne Fiscal 01/16/2021, 4:28 PM

## 2021-01-16 NOTE — Consult Note (Addendum)
Cardiology Consultation:   Patient ID: Richard [name] MRN: [id number]; DOB: 1960-08-07  Admit date: 01/15/2021 Date of Consult: 01/16/2021  PCP:  Ananias Pilgrim, MD   [facility] Providers Cardiologist:  None  Advanced Heart Failure:  Marca Ancona, MD       Patient Profile:   Richard [name] is a 60 y.o. male with a PMH of CAD s/p PCI/DES to RCA in 2019, chronic combined CHF/ NICM s/p ICD, HTN, HLD, DM type 2, venous stasis ulcer, COPD on home O2, hamartoma pulmonary nodule, and CKD stage 3a, who is being seen 01/16/2021 for the evaluation of CHF at the request of Dr. Adela Glimpse.  History of Present Illness:   Mr. Mastrangelo follows with Dr. Shirlee Latch in the AHF clinic. His heart failure history dates back to 2017 with echo revealing EF 15-20%.  Cardiac catheterization at that time revealed single vessel moderate RCA stenosis. His degree of CAD was not felt to explain the etiology of his cardiomyopathy. He subsequently underwent AICD implantation in 2018 for primary prevention. He had several readmissions for CHF. In 2019 he underwent a R/LHC showing focal severe RCA stenosis ultimately managed with PCI/DES. Serial echocardiograms have not shown significant improvement in his EF. He had a PYP scan in 2021 which was negative for TTR cardiac amyloidosis.   He was last evaluated by cardiology at an outpatient visit with Robbie Lis, PA-C 11/30/20, at which time he was felt to be fluid overloaded. ReDs Clip showed 44% with poor response to recent increase in torsemide. He was give IV lasix in clinic and torsemide increased to 80mg  BID x2 days, then down to 80mg  qam/60mg  qpm. Subsequent ReDs Clip remained elevated and torsemide 80mg  BID was recommended at that time.  He has been followed by paramedicine outpatient, last seen 12/14/20. He missed a follow-up visit 12/28/20 in the AHF clinic. His last echocardiogram 06/2020 showed EF 20-25%, global hypokinesis, severe LV dilation, mildly reduced RVSF with mild RV  enlargement, mild LAE, and no significant valvular abnormalities.  He was in his usual state of health until 1 week ago when he began experiencing SOB. He reported increase O2 needs at home from baseline 2L, increased to 4L. His symptoms acutely worsened 01/15/21 prompting him to activate EMS. He was found to have O2 sat 78% on RA on EMS arrival, placed on 4L via [place] with improvement. He received a nebulizer, IV steroids, and IV zofran en route tot he ED. On arrival he was mildly tachycardic to 100s, and intermittently tachypneic. Labs notable for Na 131, K 4.8, Cr 2.26>2.37 (baseline 1.3-1.5), WBC 22.9, Hgb 17.8, PLT 276, Lactate 2.4>2.1, BNP 532, HsTrop 37 x2. COVID-19/influenza negative. EKG with sinus tachycardia, rate 108bpm, non-specific IVCD and ST-T wave abnormalities though overall unchanged from previous. CXR showed mild central vascular congestion. AXR with normal bowel gas pattern. He was admitted to medicine for acute on chronic respiratory failure. PCCM consulted due to increase O2 needs on BiPAP and was recommended for more aggressive diuresis. Echo pending. Patient was started on IV lasix 40mg  BID with no significant I&O data. Cardiology asked to evaluate the patient.  At the time of this evaluation he has no specific complaints. He does not feel markedly SOB today though does appear mildly tachypneic with conversation. He is a bit of a poor historian. Has had some LE edema which he states is improved with leg wrappings. He has old appearing unna boots in place, as well as a wrapped left foot which he was recently seen  at a wound clinic for and underwent what sounds like debridement of a wound. He does not describe orthopnea or PND. No complaints of chest pain or palpitations. Medication compliance is unclear.      Past Medical History:  Diagnosis Date   AICD (automatic cardioverter/defibrillator) present 01/11/2017   Anxiety    CHF (congestive heart failure) (HCC)    CKD (chronic kidney  disease)    COPD (chronic obstructive pulmonary disease) (HCC)    never smoker, industrial exposure   Diabetes mellitus with diabetic neuropathy, with long-term current use of insulin (HCC)    GERD (gastroesophageal reflux disease)    High cholesterol    History of gout    "take RX qd" (11/01/2017)   Hypertension    Nonobstructive atherosclerosis of coronary artery    On home oxygen therapy    "2L prn" (11/01/2017)   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HCC)    LLL, present for years   Systolic HF (heart failure) (HCC)     Past Surgical History:  Procedure Laterality Date   CATARACT EXTRACTION W/ INTRAOCULAR LENS IMPLANTW/ TRABECULECTOMY Left ~ 2013   "may have done this when I had my other eye OR"   CIRCUMCISION     CORONARY STENT INTERVENTION N/A 11/02/2017   Procedure: CORONARY STENT INTERVENTION;  Surgeon: Laurey MoraleMcLean, Dalton S, MD;  Location: [facility] INVASIVE CV LAB;  Service: Cardiovascular;  Laterality: N/A;   CORONARY STENT INTERVENTION N/A 11/02/2017   Procedure: CORONARY STENT INTERVENTION;  Surgeon: Lyn RecordsSmith, Henry W, MD;  Location: [facility] INVASIVE CV LAB;  Service: Cardiovascular;  Laterality: N/A;   EYE SURGERY Left ~ 2013   "fell on something in basement; stuck in my eye"   ICD IMPLANT     Medtronic Dual Chamber 01/11/17   MULTIPLE TOOTH EXTRACTIONS     "pulled all my top teeth"   RIGHT/LEFT HEART CATH AND CORONARY ANGIOGRAPHY N/A 11/02/2017   Procedure: RIGHT/LEFT HEART CATH AND CORONARY ANGIOGRAPHY;  Surgeon: Laurey MoraleMcLean, Dalton S, MD;  [facility];  Service: Cardiovascular;  Laterality: N/A;     Home Medications:  Prior to Admission medications   Medication Sig Start Date End Date Taking? Authorizing Provider  acetaminophen (TYLENOL) 650 MG CR tablet Take 650 mg by mouth every 8 (eight) hours as needed for pain.    [provider]  albuterol (VENTOLIN HFA) 108 (90 Base) MCG/ACT inhaler Inhale 1-2 puffs into the lungs every 4 (four) hours as needed for wheezing or shortness  of breath. 10/17/20   Alessandra BevelsKamineni, Neelima, MD  allopurinol (ZYLOPRIM) 100 MG tablet Take 1 tablet (100 mg total) by mouth daily. Patient not taking: Reported on 12/07/2020 12/26/17   Tonye Becketlegg, Amy D, NP  aspirin 81 MG chewable tablet Chew 81 mg by mouth daily.    [provider]  atorvastatin (LIPITOR) 40 MG tablet TAKE ONE TABLET BY MOUTH DAILY AT [facility]6PM 11/15/20   Laurey MoraleMcLean, Dalton S, MD  bisoprolol (ZEBETA) 5 MG tablet Take 2.5 mg by mouth daily.    [provider]  digoxin (LANOXIN) 0.125 MG tablet TAKE 1 TABLET(0.125 MG) BY MOUTH DAILY 04/28/20   Laurey MoraleMcLean, Dalton S, MD  docusate sodium (COLACE) 100 MG capsule Take 100 mg by mouth daily as needed for mild constipation.    [provider]  DULoxetine (CYMBALTA) 60 MG capsule Take 60 mg by mouth daily. 09/28/17   [provider]  eplerenone (INSPRA) 50 MG tablet Take 1 tablet (50 mg total) by mouth daily. 07/27/20  Laurey Morale, MD  fenofibrate (TRICOR) 145 MG tablet TAKE ONE TABLET BY MOUTH DAILY 12/15/20   Laurey Morale, MD  hydrALAZINE (APRESOLINE) 50 MG tablet Take 1.5 tablets (75 mg total) by mouth 3 (three) times daily. 09/09/20   Laurey Morale, MD  icosapent Ethyl (VASCEPA) 1 g capsule Take 2 capsules (2 g total) by mouth 2 (two) times daily. Patient not taking: No sig reported 10/08/20   Laurey Morale, MD  Insulin Pen Needle (NOVOFINE) 30G X 8 MM MISC Inject 10 each into the skin as needed. 11/06/17   Arnetha Courser, MD  insulin regular human CONCENTRATED (HUMULIN R) 500 UNIT/ML kwikpen Inject 0-400 Units into the skin as directed. Sliding 07/18/17   [provider]  isosorbide dinitrate (ISORDIL) 20 MG tablet TAKE TWO TABLETS BY MOUTH THREE TIMES A DAY 12/15/20   Laurey Morale, MD  JARDIANCE 25 MG TABS tablet Take 25 mg by mouth daily. 10/15/19   Laurey Morale, MD  MAGNESIUM-OXIDE 400 (241.3 Mg) MG tablet Take 1 tablet (400 mg total) by mouth daily. 08/27/20   Laurey Morale, MD  omeprazole (PRILOSEC)  20 MG capsule TAKE ONE CAPSULE BY MOUTH DAILY Patient not taking: No sig reported 10/20/20   Laurey Morale, MD  pregabalin (LYRICA) 150 MG capsule Take 150 mg by mouth 3 (three) times daily. 10/07/20   [provider]  sacubitril-valsartan (ENTRESTO) 97-103 MG Take 1 tablet by mouth 2 (two) times daily. 03/17/20   Laurey Morale, MD  Torsemide 40 MG TABS Take 80 mg by mouth in the morning AND 60 mg every evening. 12/07/20   Laurey Morale, MD    Inpatient Medications: Scheduled Meds:  atorvastatin  40 mg Oral Daily   docusate sodium  100 mg Oral BID   furosemide  40 mg Intravenous Q12H   heparin  5,000 Units Subcutaneous Q8H   insulin aspart  0-9 Units Subcutaneous Q4H   ipratropium-albuterol  3 mL Nebulization Q6H   methylPREDNISolone (SOLU-MEDROL) injection  40 mg Intravenous Q12H   Followed by   Melene Muller ON 01/17/2021] predniSONE  40 mg Oral Q breakfast   pantoprazole (PROTONIX) IV  40 mg Intravenous QHS   senna  1 tablet Oral BID   Continuous Infusions:  sodium chloride 75 mL/hr (01/16/21 0012)   azithromycin     cefTRIAXone (ROCEPHIN)  IV     PRN Meds: acetaminophen **OR** acetaminophen, albuterol  Allergies:    Allergies  Allergen Reactions   Aspirin Itching   Codeine    Coconut Oil Itching    Social History:   Social History   Socioeconomic History   Marital status: Divorced    Spouse name: Not on file   Number of children: Not on file   Years of education: Not on file   Highest education level: Not on file  Occupational History   Occupation: disability    Comment: stopped working in 2000 d/t occupational exposures  Tobacco Use   Smoking status: Former    Types: Cigarettes   Smokeless tobacco: Never   Tobacco comments:    "smoked when I drank"  Vaping Use   Vaping Use: Never used  Substance and Sexual Activity   Alcohol use: Not Currently    Comment: used to drink multiple cases of beer daily, quit 2018   Drug use: Never   Sexual activity:  Not Currently  Other Topics Concern   Not on file  Social History Narrative   Patient  lives at home with wife and some of his 10 children. He self-administers his own medications.   Social Determinants of Health   Financial Resource Strain: Low Risk    Difficulty of Paying Living Expenses: Not very hard  Food Insecurity: No Food Insecurity   Worried About Programme researcher, broadcasting/film/video in the Last Year: Never true   Ran Out of Food in the Last Year: Never true  Transportation Needs: No Transportation Needs   Lack of Transportation (Medical): No   Lack of Transportation (Non-Medical): No  Physical Activity: Not on file  Stress: Not on file  Social Connections: Not on file  Intimate Partner Violence: Not on file    Family History:    Family History  Problem Relation Age of Onset   Hypertension Mother    Diabetes Mother    Hypertension Father    Diabetes Father    Diabetes Sister    Diabetes Brother      ROS:  Please see the history of present illness.   All other ROS reviewed and negative.     Physical Exam/Data:   Vitals:   01/16/21 0100 01/16/21 0107 01/16/21 0558 01/16/21 0723  BP: 107/81     Pulse: 65     Resp: 16     Temp:    97.7 F (36.5 C)  TempSrc:    Oral  SpO2: 93% 92%    Weight:   114.4 kg   Height:   6\' 5"  (1.956 m)     Intake/Output Summary (Last 24 hours) at 01/16/2021 0732 Last data filed at 01/16/2021 0436 Gross per 24 hour  Intake 328.81 ml  Output --  Net 328.81 ml   Last 3 Weights 01/16/2021 12/07/2020 12/06/2020  Weight (lbs) 252 lb 3.3 oz 274 lb 274 lb 6.4 oz  Weight (kg) 114.4 kg 124.286 kg 124.467 kg     Body mass index is 29.91 kg/m.  General:  Sitting upright in bed in no acute distress HEENT: sclera anicteric Neck: no JVD appreciated Vascular: No carotid bruits; distal pulses 2+ on RLE (unable to assess LLE given wrapping) Cardiac:  normal S1, S2; RRR; no murmurs, rubs, or gallops Lungs:  diminished breath sounds but no overt wheezing,  rhonchi or rales  Abd: soft, nontender, no hepatomegaly  Ext: no significant LE edema, though wrappings limit full exam Musculoskeletal:  No deformities, BUE and BLE strength normal and equal Skin: warm and dry  Neuro:  CNs 2-12 intact, no focal abnormalities noted Psych:  Normal affect   EKG:  The EKG was personally reviewed and demonstrates:  sinus rhythm with rate 108 bpm, non-specific IVCD, non-specific ST-T wave abnormalities, no significant change from previous.  Telemetry:  Telemetry was personally reviewed and demonstrates:  sinus rhythm/sinus tachycardia  Relevant CV Studies: Echocardiogram 06/2020: 1. EF remains severely depressed compared to echo done 07/30/18.   2. Mild pulsus alternans in LVOT signal persists . Left ventricular  ejection fraction, by estimation, is 20 to 25%. The left ventricle has  severely decreased function. The left ventricle demonstrates global  hypokinesis. The left ventricular internal  cavity size was severely dilated. Left ventricular diastolic parameters  were normal.   3. Right ventricular systolic function is mildly reduced. The right  ventricular size is mildly enlarged.   4. Left atrial size was mildly dilated.   5. The mitral valve is normal in structure. Trivial mitral valve  regurgitation. No evidence of mitral stenosis.   6. The aortic valve is normal in  structure. Aortic valve regurgitation is  not visualized. No aortic stenosis is present.   7. The inferior vena cava is dilated in size with <50% respiratory  variability, suggesting right atrial pressure of 15 mmHg.   Laboratory Data:  High Sensitivity Troponin:   Recent Labs  Lab 01/15/21 1545 01/15/21 1805  TROPONINIHS 37* 37*     Chemistry Recent Labs  Lab 01/15/21 1545 01/15/21 1716 01/15/21 2045 01/16/21 0323  NA 131* 132* 134* 135  K 4.8 4.6 4.9 5.2*  CL 90*  --   --  93*  CO2 25  --   --  27  GLUCOSE 362*  --   --  454*  BUN 68*  --   --  77*  CREATININE 2.26*   --   --  2.37*  CALCIUM 8.9  --   --  8.9  GFRNONAA 33*  --   --  31*  ANIONGAP 16*  --   --  15    Recent Labs  Lab 01/15/21 1545 01/16/21 0323  PROT 8.1 7.6  ALBUMIN 2.5* 2.1*  AST 23 18  ALT 22 20  ALKPHOS 73 78  BILITOT 1.7* 1.2   Hematology Recent Labs  Lab 01/15/21 1545 01/15/21 1716 01/15/21 2045 01/16/21 0323  WBC 22.9*  --   --  24.8*  RBC 6.13*  --   --  5.75  HGB 17.8* 19.4* 19.0* 16.5  HCT 53.8* 57.0* 56.0* 51.3  MCV 87.8  --   --  89.2  MCH 29.0  --   --  28.7  MCHC 33.1  --   --  32.2  RDW 19.2*  --   --  19.3*  PLT 276  --   --  295   BNP Recent Labs  Lab 01/15/21 1545  BNP 532.8*    DDimer No results for input(s): DDIMER in the last 168 hours.   Radiology/Studies:  DG Chest Portable 1 View  Result Date: 01/15/2021 CLINICAL DATA:  60 year old male with shortness of breath. EXAM: PORTABLE ABDOMEN - 1 VIEW; PORTABLE CHEST - 1 VIEW COMPARISON:  Earlier radiograph dated 01/15/2021. FINDINGS: No focal consolidation, pleural effusion, pneumothorax. Cardiomegaly with mild central vascular congestion. Left pectoral pacemaker device. No bowel dilatation or evidence of obstruction. No free air or radiopaque calculi. The osseous structures are intact. The soft tissues are unremarkable. IMPRESSION: 1. Cardiomegaly with mild central vascular congestion. 2. No evidence of bowel obstruction. Electronically Signed   By: Elgie Collard M.D.   On: 01/15/2021 21:32   DG Chest Port 1 View  Result Date: 01/15/2021 CLINICAL DATA:  Dyspnea. EXAM: PORTABLE CHEST 1 VIEW COMPARISON:  10/16/2020.  Chest CT, 12/17/2020. FINDINGS: Cardiac silhouette is mildly enlarged. No mediastinal or hilar masses. Rounded nodule at the left lung base is unchanged from the prior exams. Remainder of the lungs is clear. No pleural effusion or pneumothorax. Left anterior chest wall sequential pacemaker is stable. Skeletal structures are grossly intact. IMPRESSION: 1. No acute cardiopulmonary  disease. 2. Left lower lobe pulmonary nodule is without significant change from the most recent prior exams. 3. Stable cardiomegaly. Electronically Signed   By: Amie Portland M.D.   On: 01/15/2021 16:23   DG Abd Portable 1V  Result Date: 01/15/2021 CLINICAL DATA:  60 year old male with shortness of breath. EXAM: PORTABLE ABDOMEN - 1 VIEW; PORTABLE CHEST - 1 VIEW COMPARISON:  Earlier radiograph dated 01/15/2021. FINDINGS: No focal consolidation, pleural effusion, pneumothorax. Cardiomegaly with mild central vascular congestion. Left pectoral pacemaker  device. No bowel dilatation or evidence of obstruction. No free air or radiopaque calculi. The osseous structures are intact. The soft tissues are unremarkable. IMPRESSION: 1. Cardiomegaly with mild central vascular congestion. 2. No evidence of bowel obstruction. Electronically Signed   By: Elgie Collard M.D.   On: 01/15/2021 21:32     Assessment and Plan:   1. Acute on chronic respiratory failure in patient with chronic combined CHF and COPD: patient presented with worsening SOB over the past week and increased O2 demands. He was hypoxic to 78% on RA and required BiPAP overnight. BNP 532. HsTrop 37 x2. EKG was non-ischemic. CXR with mild vascular congestion. COVID-19/influenza negative. He was started on IV lasix 40mg  BID, though no significant I&O or weight trend yet. Overall does not appear significantly volume overloaded, though difficult to appreciate given obesity. Initial plan was to place PICC line for coox/CVP monitoring, however BCx positive for staph and strep. Suspicious for contaminant per ID. Repeat Bcx pending. He had a significant leukocytosis on admission; currently on IV antibiotics for possible CAP with IV vanc added in the setting of possible bacteremia. Home bisoprolol, digoxin, hydralazine, eplerenone, entresto, isordil on hold in the setting of AKI and hypotension. Echo pending.  - Jairus Tonne hold off on PICC line placement for now -  Continue IV lasix - Sherian Valenza increase to 60mg  BID given marginal UOP with 40mg . Monitor for response - Hopeful to restart home medications in the coming day(s) - Continue to monitor strict I&Os and daily weights - Continue to monitor electrolytes and replete as needed to maintain K >4, Mg <2 - Continue antibiotics per primary team.  2. CAD s/p PCI/DES to RCA in 2019: no anginal complaints. EKG non-ischemic. HsTrop with low flat trend not c/w ACS.  - Continue aspirin and statin  3. HTN: BP soft. Home medications on hold as above - Managed in the context of #1  4. HLD: LDL 42 04/2020 - Continue atorvastatin  5. NICM s/p AICD: no device shocks. Normal function on last interrogation 11/2020 - Continue routine outpatient monitoring.   6. AoCKD stage 3a: Cr 2.37 this admission, up from baseline ~1.5. Possible this is 2/2 volume overload.  - Continue to monitor closely with diuresis as above.   7. Chronic venous stasis ulcer: s/p debridement of left foot wounds 01/13/21, currently wrapped. WOC RN evaluated patient.  - Continue daily wound care per WOC RN   Risk Assessment/Risk Scores:   New York Heart Association (NYHA) Functional Class NYHA Class III        For questions or updates, please contact [facility] Please consult [email or web address] for contact info under    Signed, 05/2020, PA-C  01/16/2021 7:32 AM  I have seen and examined this patient with 01/15/21.  Agree with above, note added to reflect my findings.  On exam, tachycardic, regular, mild crackles. Patient presented with SOB, found to have an elevated BNP. Patient states no severely SOB. Able to lie flat. Volume status difficult, Creatinine elevated potentially due to volume overload. Amaya Blakeman plan for PICC line placement for COOX and CVP to guide in diuresis. Would continue current diuresis until PICC is placed. Has blood culture positive but possibly contaminant per ID. Continue antibiotics as repeat cultures  pending.  Chucky Homes M. Rueben Kassim MD 01/16/2021 7:15 PM

## 2021-01-16 NOTE — Progress Notes (Signed)
eLink Physician-Brief Progress Note Patient Name: Richard [name] DOB: [birth year] MRN: [id number]   Date of Service  01/16/2021  HPI/Events of Note  ABG 7.29/57.5/113/27/BE 1.1.  Patient is lethargic. He is currently on CPAP of 8 cm H2O because, per report, he did not tolerate BiPAP.   eICU Interventions  The patient has lethargy in setting of acute hypercarbic respiratory failure. RT to switch to BiPAP with EPAP of 5 cm H2O, and titrate IPAP to achieve VT of 7-8 cc/kg IBW. If patient is unable to keep BiPAP on, notify MD for evaluation of possible anxiolytic to help with adherence.     Intervention Category Major Interventions: Respiratory failure - evaluation and management  Marveen Reeks Ceylon Arenson 01/16/2021, 3:10 AM

## 2021-01-16 NOTE — Progress Notes (Signed)
Pharmacy Antibiotic Note  Richard [name] is a 60 y.o. male admitted on 01/15/2021 with  shortness of breath and hypoxia on home O2 .  On admission patient was started empirically on Rocephin and Azithromycin for possible COPD exacerbation.  BCx obtained with 1/4 bottles growing staph and strep species.  Communicated information to MD who would like to add Vancomycin to current abx regimen to cover for possible staph bacteremia.  Noted elevated SCr 2.3 (baseline 1.5-1.8).  Will monitor.  Plan: Vancomycin 2000mg  IV x 1 dose, followed by Vancomycin 1500mg  IV q24h Calculated AUC 457 (goal 400-550) using SCr 2.37, Vd 0.72 Monitor renal fxn, culture data, and clinical progress.  Height: 6\' 5"  (195.6 cm) Weight: 114.4 kg (252 lb 3.3 oz) IBW/kg (Calculated) : 89.1  Temp (24hrs), Avg:98.2 F (36.8 C), Min:97.7 F (36.5 C), Max:98.6 F (37 C)  Recent Labs  Lab 01/15/21 1545 01/15/21 1805 01/15/21 2307 01/16/21 0323  WBC 22.9*  --   --  24.8*  CREATININE 2.26*  --   --  2.37*  LATICACIDVEN 2.4* 2.1* 1.8 2.1*    Estimated Creatinine Clearance: 47.7 mL/min (A) (by C-G formula based on SCr of 2.37 mg/dL (H)).    Allergies  Allergen Reactions   Aspirin Itching   Codeine    Coconut Oil Itching    Antimicrobials this admission: CTX 7/23 >> Azithro 7/23 >> Vanc 7/24 >>  Dose adjustments this admission:   Microbiology results: 7/23 BCx x 2 >> 1/4 with CPC chains BCID > staph sp and strep sp, no resistance 7/24 BCx >> pending   Thank you for allowing pharmacy to be a part of this patient's care.  8/24, Pharm.D., BCPS Clinical Pharmacist  **Pharmacist phone directory can be found on [email or web address] listed under [facility].  01/16/2021 11:14 AM

## 2021-01-16 NOTE — Progress Notes (Signed)
Due to  PROGRESS NOTE  Richard [name] ELF:[id number] DOB: 11-29-1960 DOA: 01/15/2021 PCP: Ananias Pilgrim, MD  HPI/Recap of past 24 hours: Richard [name] is a 60 y.o. male with medical history significant of DM2, COPD on chronic O2 2 L, systolic CHF sp AICD, venous stasis ulcer, HTN, CKD, pulmonary nodule (hamartoma) presented with increased SOB, no fever, no CP. Noted to have  Sp02 78% on RA with EMS. 4L [place] applied EMS gave 1 Neb, 125mg  Solumedrol IV and 4mg  Zofran IV. Pt remained on 4 L despite repeated neb treatments. Hx of severe COPD on O2 former smoker. Pt was hospitalized at [facility] in April of this year for CHF exacerbation requiring aggressive diuresis. Chronic leg wounds followed by wound care.  PCCM was consulted for worsening hypoxia and patient being on BiPAP.  Patient admitted for further management.    Today, patient still short of breath, denies any chest pain, cough, abdominal pain, nausea/vomiting, fever/chills.  CBGs elevated, worsened by steroid intake.  Patient overall teary/frustrated about her medical condition.   Assessment/Plan: Principal Problem:   Acute on chronic respiratory failure with hypoxia and hypercapnia (HCC) Active Problems:   Hypertension   CKD (chronic kidney disease)   ICD (implantable cardioverter-defibrillator) in place   GERD (gastroesophageal reflux disease)   Diabetes mellitus with diabetic neuropathy, with long-term current use of insulin (HCC)   COPD exacerbation (HCC)   Chronic systolic CHF (congestive heart failure) (HCC)   Acute renal failure superimposed on stage 3 chronic kidney disease (HCC)   Elevated troponin   Hyponatremia   Acute on chronic hypoxic respiratory failure Acute on chronic combined HF, AICD-denies any device shocks Patient required BiPAP overnight with sats dropping to 78%, now on 4 L of O2 BNP 532, troponin flat trend EKG nonischemic Chest x-ray with mild vascular congestion Echo pending Cardiology on board,  continue Lasix Hold home bisoprolol, digoxin, hydralazine, eplerenone, Entresto, Isordil, plan to restart pending AKI, hypotension Strict I's and O's, daily weights Telemetry  Severe sepsis Likely 2/2 wound infection Vs bacteremia On admission tachycardic, tachypneic, LA >2, leukocytosis Procalcitonin elevated, will trend Chest x-ray as above with congestion, UA unremarkable BC x2 grew 1/4 Streptococcus and Staphylococcus, possibly contaminant, repeat pending Continue azithromycin, ceftriaxone, vancomycin Monitor closely  Possible COPD exacerbation On supplemental O2 Continue azithromycin, prednisone, plan to taper, duo nebs as needed Supplemental oxygen as needed  Diabetes mellitus type 2 CBGs uncontrolled, worsened by steroid use A1c pending SSI, Lantus, Accu-Cheks, hypoglycemic protocol  AKI on CKD stage III Baseline around 1.5 Daily CMP  CAD/HLD S/p PCI Currently chest pain-free Continue aspirin, statin  Hypertension BP soft Management as above  Chronic venous stasis ulcer S/p debridement of left foot wounds on 01/13/2021 WOC consulted Daily wound care     Estimated body mass index is 29.91 kg/m as calculated from the following:   Height as of this encounter: 6\' 5"  (1.956 m).   Weight as of this encounter: 114.4 kg.     Code Status: Full  Family Communication: None at bedside  Disposition Plan: Status is: Inpatient  Remains inpatient appropriate because:Inpatient level of care appropriate due to severity of illness  Dispo: The patient is from: Home              Anticipated d/c is to: Home              Patient currently is not medically stable to d/c.   Difficult to place patient No      Consultants: PCCM  Cardiology  Procedures: None  Antimicrobials: Azithromycin Ceftriaxone Vancomycin  DVT prophylaxis: Heparin [place]   Objective: Vitals:   01/16/21 0723 01/16/21 0820 01/16/21 1140 01/16/21 1351  BP:   95/60   Pulse:   78   Resp:    20   Temp: 97.7 F (36.5 C)  98.4 F (36.9 C)   TempSrc: Oral  Oral   SpO2:  95% 99% 93%  Weight:      Height:        Intake/Output Summary (Last 24 hours) at 01/16/2021 1447 Last data filed at 01/16/2021 1300 Gross per 24 hour  Intake 1048.81 ml  Output 950 ml  Net 98.81 ml   Filed Weights   01/16/21 0558  Weight: 114.4 kg    Exam: General: NAD, acutely ill-appearing Cardiovascular: S1, S2 present Respiratory: Diminished breath sounds bilaterally Abdomen: Soft, nontender, nondistended, bowel sounds present Musculoskeletal: Trace bilateral pedal edema noted, BLE with Ace wrap Skin: Multiple chronic wound on BLE with Ace wrap Psychiatry: Normal mood     Data Reviewed: CBC: Recent Labs  Lab 01/15/21 1545 01/15/21 1716 01/15/21 2045 01/16/21 0323  WBC 22.9*  --   --  24.8*  NEUTROABS 18.9*  --   --  22.5*  HGB 17.8* 19.4* 19.0* 16.5  HCT 53.8* 57.0* 56.0* 51.3  MCV 87.8  --   --  89.2  PLT 276  --   --  295   Basic Metabolic Panel: Recent Labs  Lab 01/15/21 1545 01/15/21 1716 01/15/21 2045 01/15/21 2307 01/16/21 0323  NA 131* 132* 134*  --  135  K 4.8 4.6 4.9  --  5.2*  CL 90*  --   --   --  93*  CO2 25  --   --   --  27  GLUCOSE 362*  --   --   --  454*  BUN 68*  --   --   --  77*  CREATININE 2.26*  --   --   --  2.37*  CALCIUM 8.9  --   --   --  8.9  MG  --   --   --  2.5* 2.7*  PHOS  --   --   --  6.2* 6.9*   GFR: Estimated Creatinine Clearance: 47.7 mL/min (A) (by C-G formula based on SCr of 2.37 mg/dL (H)). Liver Function Tests: Recent Labs  Lab 01/15/21 1545 01/16/21 0323  AST 23 18  ALT 22 20  ALKPHOS 73 78  BILITOT 1.7* 1.2  PROT 8.1 7.6  ALBUMIN 2.5* 2.1*   No results for input(s): LIPASE, AMYLASE in the last 168 hours. No results for input(s): AMMONIA in the last 168 hours. Coagulation Profile: No results for input(s): INR, PROTIME in the last 168 hours. Cardiac Enzymes: Recent Labs  Lab 01/15/21 2307  CKTOTAL 101   BNP  (last 3 results) No results for input(s): PROBNP in the last 8760 hours. HbA1C: No results for input(s): HGBA1C in the last 72 hours. CBG: Recent Labs  Lab 01/15/21 2304 01/16/21 0411 01/16/21 0726 01/16/21 0840 01/16/21 1134  GLUCAP 393* 381* 432* 406* 451*   Lipid Profile: No results for input(s): CHOL, HDL, LDLCALC, TRIG, CHOLHDL, LDLDIRECT in the last 72 hours. Thyroid Function Tests: Recent Labs    01/16/21 0323  TSH 0.473   Anemia Panel: No results for input(s): VITAMINB12, FOLATE, FERRITIN, TIBC, IRON, RETICCTPCT in the last 72 hours. Urine analysis:    Component Value Date/Time   COLORURINE YELLOW  01/16/2021 0700   APPEARANCEUR CLEAR 01/16/2021 0700   LABSPEC 1.015 01/16/2021 0700   PHURINE 5.0 01/16/2021 0700   GLUCOSEU >=500 (A) 01/16/2021 0700   HGBUR NEGATIVE 01/16/2021 0700   BILIRUBINUR NEGATIVE 01/16/2021 0700   KETONESUR NEGATIVE 01/16/2021 0700   PROTEINUR NEGATIVE 01/16/2021 0700   NITRITE NEGATIVE 01/16/2021 0700   LEUKOCYTESUR NEGATIVE 01/16/2021 0700   Sepsis Labs: @LABRCNTIP (procalcitonin:4,lacticidven:4)  ) Recent Results (from the past 240 hour(s))  Culture, blood (routine x 2)     Status: None (Preliminary result)   Collection Time: 01/15/21  3:41 PM   Specimen: BLOOD RIGHT HAND  Result Value Ref Range Status   Specimen Description BLOOD RIGHT HAND  Final   Special Requests   Final    BOTTLES DRAWN AEROBIC AND ANAEROBIC Blood Culture adequate volume   Culture   Final    NO GROWTH < 24 HOURS Performed at [facility], [address], [place], [place] [address]    Report Status PENDING  Incomplete  Resp Panel by RT-PCR (Flu A&B, Covid) Nasopharyngeal Swab     Status: None   Collection Time: 01/15/21  3:45 PM   Specimen: Nasopharyngeal Swab; Nasopharyngeal(NP) swabs in vial transport medium  Result Value Ref Range Status   SARS Coronavirus 2 by RT PCR NEGATIVE NEGATIVE Final    Comment: (NOTE) SARS-CoV-2 target nucleic acids  are NOT DETECTED.  The SARS-CoV-2 RNA is generally detectable in upper respiratory specimens during the acute phase of infection. The lowest concentration of SARS-CoV-2 viral copies this assay can detect is 138 copies/mL. A negative result does not preclude SARS-Cov-2 infection and should not be used as the sole basis for treatment or other patient management decisions. A negative result may occur with  improper specimen collection/handling, submission of specimen other than nasopharyngeal swab, presence of viral mutation(s) within the areas targeted by this assay, and inadequate number of viral copies(<138 copies/mL). A negative result must be combined with clinical observations, patient history, and epidemiological information. The expected result is Negative.  Fact Sheet for Patients:  BloggerCourse.com  Fact Sheet for Healthcare Providers:  SeriousBroker.it  This test is no t yet approved or cleared by the [place] FDA and  has been authorized for detection and/or diagnosis of SARS-CoV-2 by FDA under an Emergency Use Authorization (EUA). This EUA will remain  in effect (meaning this test can be used) for the duration of the COVID-19 declaration under Section 564(b)(1) of the Act, 21 U.S.C.section 360bbb-3(b)(1), unless the authorization is terminated  or revoked sooner.       Influenza A by PCR NEGATIVE NEGATIVE Final   Influenza B by PCR NEGATIVE NEGATIVE Final    Comment: (NOTE) The Xpert Xpress SARS-CoV-2/FLU/RSV plus assay is intended as an aid in the diagnosis of influenza from Nasopharyngeal swab specimens and should not be used as a sole basis for treatment. Nasal washings and aspirates are unacceptable for Xpert Xpress SARS-CoV-2/FLU/RSV testing.  Fact Sheet for Patients: BloggerCourse.com  Fact Sheet for Healthcare Providers: SeriousBroker.it  This test is  not yet approved or cleared by the [place] FDA and has been authorized for detection and/or diagnosis of SARS-CoV-2 by FDA under an Emergency Use Authorization (EUA). This EUA will remain in effect (meaning this test can be used) for the duration of the COVID-19 declaration under Section 564(b)(1) of the Act, 21 U.S.C. section 360bbb-3(b)(1), unless the authorization is terminated or revoked.  Performed at [facility], [address], [place], [place] [address]  Culture, blood (routine x 2)     Status: None (Preliminary result)   Collection Time: 01/15/21  6:05 PM   Specimen: BLOOD LEFT FOREARM  Result Value Ref Range Status   Specimen Description BLOOD LEFT FOREARM  Final   Special Requests   Final    BOTTLES DRAWN AEROBIC AND ANAEROBIC Blood Culture adequate volume   Culture  Setup Time   Final    GRAM POSITIVE COCCI IN CHAINS AEROBIC BOTTLE ONLY CRITICAL RESULT CALLED TO, READ BACK BY AND VERIFIED WITH: PHARM D K.HAMMONS ON [id number] AT 0935 BY E.PARRISH Performed at [facility], [address] [place], [place] [address]    Culture GRAM POSITIVE COCCI  Final   Report Status PENDING  Incomplete  Blood Culture ID Panel (Reflexed)     Status: Abnormal   Collection Time: 01/15/21  6:05 PM  Result Value Ref Range Status   Enterococcus faecalis NOT DETECTED NOT DETECTED Final   Enterococcus Faecium NOT DETECTED NOT DETECTED Final   Listeria monocytogenes NOT DETECTED NOT DETECTED Final   Staphylococcus species DETECTED (A) NOT DETECTED Final    Comment: CRITICAL RESULT CALLED TO, READ BACK BY AND VERIFIED WITH: PHARM D K.HAMMONS ON [id number] AT 0935 BY E.PARRISH    Staphylococcus aureus (BCID) NOT DETECTED NOT DETECTED Final   Staphylococcus epidermidis NOT DETECTED NOT DETECTED Final   Staphylococcus lugdunensis NOT DETECTED NOT DETECTED Final   Streptococcus species DETECTED (A) NOT DETECTED Final    Comment: Not Enterococcus species, Streptococcus  agalactiae, Streptococcus pyogenes, or Streptococcus pneumoniae. CRITICAL RESULT CALLED TO, READ BACK BY AND VERIFIED WITH: PHARM D K.HAMMONS ON [id number] AT 0935 BY E.PARRISH    Streptococcus agalactiae NOT DETECTED NOT DETECTED Final   Streptococcus pneumoniae NOT DETECTED NOT DETECTED Final   Streptococcus pyogenes NOT DETECTED NOT DETECTED Final   A.calcoaceticus-baumannii NOT DETECTED NOT DETECTED Final   Bacteroides fragilis NOT DETECTED NOT DETECTED Final   Enterobacterales NOT DETECTED NOT DETECTED Final   Enterobacter cloacae complex NOT DETECTED NOT DETECTED Final   Escherichia coli NOT DETECTED NOT DETECTED Final   Klebsiella aerogenes NOT DETECTED NOT DETECTED Final   Klebsiella oxytoca NOT DETECTED NOT DETECTED Final   Klebsiella pneumoniae NOT DETECTED NOT DETECTED Final   Proteus species NOT DETECTED NOT DETECTED Final   Salmonella species NOT DETECTED NOT DETECTED Final   Serratia marcescens NOT DETECTED NOT DETECTED Final   Haemophilus influenzae NOT DETECTED NOT DETECTED Final   Neisseria meningitidis NOT DETECTED NOT DETECTED Final   Pseudomonas aeruginosa NOT DETECTED NOT DETECTED Final   Stenotrophomonas maltophilia NOT DETECTED NOT DETECTED Final   Candida albicans NOT DETECTED NOT DETECTED Final   Candida auris NOT DETECTED NOT DETECTED Final   Candida glabrata NOT DETECTED NOT DETECTED Final   Candida krusei NOT DETECTED NOT DETECTED Final   Candida parapsilosis NOT DETECTED NOT DETECTED Final   Candida tropicalis NOT DETECTED NOT DETECTED Final   Cryptococcus neoformans/gattii NOT DETECTED NOT DETECTED Final    Comment: Performed at [facility], [address] [place], [place] [address]  MRSA Next Gen by PCR, Nasal     Status: Abnormal   Collection Time: 01/15/21 10:38 PM   Specimen: Nasal Mucosa; Nasal Swab  Result Value Ref Range Status   MRSA by PCR Next Gen DETECTED (A) NOT DETECTED Final    Comment: RESULT CALLED TO, READ BACK BY AND VERIFIED  WITH: RN M.SHULL ON [id number] AT 1121 BY E.PARRISH (NOTE) The GeneXpert MRSA Assay (  FDA approved for NASAL specimens only), is one component of a comprehensive MRSA colonization surveillance program. It is not intended to diagnose MRSA infection nor to guide or monitor treatment for MRSA infections. Test performance is not FDA approved in patients less than 59 years old. Performed at [facility], [address] [place], [place] [id number]       Studies: DG Chest Portable 1 View  Result Date: 01/15/2021 CLINICAL DATA:  60 year old male with shortness of breath. EXAM: PORTABLE ABDOMEN - 1 VIEW; PORTABLE CHEST - 1 VIEW COMPARISON:  Earlier radiograph dated 01/15/2021. FINDINGS: No focal consolidation, pleural effusion, pneumothorax. Cardiomegaly with mild central vascular congestion. Left pectoral pacemaker device. No bowel dilatation or evidence of obstruction. No free air or radiopaque calculi. The osseous structures are intact. The soft tissues are unremarkable. IMPRESSION: 1. Cardiomegaly with mild central vascular congestion. 2. No evidence of bowel obstruction. Electronically Signed   By: Elgie CollardArash  Radparvar M.D.   On: 01/15/2021 21:32   DG Chest Port 1 View  Result Date: 01/15/2021 CLINICAL DATA:  Dyspnea. EXAM: PORTABLE CHEST 1 VIEW COMPARISON:  10/16/2020.  Chest CT, 12/17/2020. FINDINGS: Cardiac silhouette is mildly enlarged. No mediastinal or hilar masses. Rounded nodule at the left lung base is unchanged from the prior exams. Remainder of the lungs is clear. No pleural effusion or pneumothorax. Left anterior chest wall sequential pacemaker is stable. Skeletal structures are grossly intact. IMPRESSION: 1. No acute cardiopulmonary disease. 2. Left lower lobe pulmonary nodule is without significant change from the most recent prior exams. 3. Stable cardiomegaly. Electronically Signed   By: Amie Portlandavid  Ormond M.D.   On: 01/15/2021 16:23   DG Abd Portable 1V  Result Date:  01/15/2021 CLINICAL DATA:  60 year old male with shortness of breath. EXAM: PORTABLE ABDOMEN - 1 VIEW; PORTABLE CHEST - 1 VIEW COMPARISON:  Earlier radiograph dated 01/15/2021. FINDINGS: No focal consolidation, pleural effusion, pneumothorax. Cardiomegaly with mild central vascular congestion. Left pectoral pacemaker device. No bowel dilatation or evidence of obstruction. No free air or radiopaque calculi. The osseous structures are intact. The soft tissues are unremarkable. IMPRESSION: 1. Cardiomegaly with mild central vascular congestion. 2. No evidence of bowel obstruction. Electronically Signed   By: Elgie CollardArash  Radparvar M.D.   On: 01/15/2021 21:32   US EKG SITE RITE  Result Date: 01/16/2021 If Site Rite image not attached, placement could not be confirmed due to current cardiac rhythm.   Scheduled Meds:  [START ON 01/17/2021] aspirin EC  81 mg Oral Daily   atorvastatin  40 mg Oral Daily   docusate sodium  100 mg Oral BID   furosemide  60 mg Intravenous Q12H   heparin  5,000 Units Subcutaneous Q8H   insulin aspart  0-15 Units Subcutaneous TID WC   insulin aspart  0-5 Units Subcutaneous QHS   insulin aspart  4 Units Subcutaneous TID WC   insulin glargine  10 Units Subcutaneous QHS   ipratropium-albuterol  3 mL Nebulization Q6H   methylPREDNISolone (SOLU-MEDROL) injection  40 mg Intravenous Q12H   Followed by   Melene Muller[START ON 01/17/2021] predniSONE  40 mg Oral Q breakfast   pantoprazole (PROTONIX) IV  40 mg Intravenous QHS   senna  1 tablet Oral BID    Continuous Infusions:  azithromycin     cefTRIAXone (ROCEPHIN)  IV     [START ON 01/17/2021] vancomycin       LOS: 1 day     Briant CedarNkeiruka J Bettie Capistran, MD [facility]  If 7PM-7AM, please contact night-coverage  [email or web address] 01/16/2021, 2:47 PM

## 2021-01-17 ENCOUNTER — Other Ambulatory Visit (HOSPITAL_COMMUNITY): Payer: Self-pay | Admitting: Cardiology

## 2021-01-17 ENCOUNTER — Inpatient Hospital Stay: Payer: Self-pay

## 2021-01-17 DIAGNOSIS — I5043 Acute on chronic combined systolic (congestive) and diastolic (congestive) heart failure: Secondary | ICD-10-CM

## 2021-01-17 DIAGNOSIS — E1140 Type 2 diabetes mellitus with diabetic neuropathy, unspecified: Secondary | ICD-10-CM | POA: Diagnosis not present

## 2021-01-17 DIAGNOSIS — J441 Chronic obstructive pulmonary disease with (acute) exacerbation: Secondary | ICD-10-CM | POA: Diagnosis not present

## 2021-01-17 DIAGNOSIS — J9621 Acute and chronic respiratory failure with hypoxia: Secondary | ICD-10-CM | POA: Diagnosis not present

## 2021-01-17 DIAGNOSIS — J9622 Acute and chronic respiratory failure with hypercapnia: Secondary | ICD-10-CM | POA: Diagnosis not present

## 2021-01-17 DIAGNOSIS — N179 Acute kidney failure, unspecified: Secondary | ICD-10-CM | POA: Diagnosis not present

## 2021-01-17 LAB — GLUCOSE, CAPILLARY
Glucose-Capillary: 259 mg/dL — ABNORMAL HIGH (ref 70–99)
Glucose-Capillary: 230 mg/dL — ABNORMAL HIGH (ref 70–99)
Glucose-Capillary: 269 mg/dL — ABNORMAL HIGH (ref 70–99)
Glucose-Capillary: 187 mg/dL — ABNORMAL HIGH (ref 70–99)

## 2021-01-17 LAB — CBC WITH DIFFERENTIAL/PLATELET
Abs Immature Granulocytes: 0.37 10*3/uL — ABNORMAL HIGH (ref 0.00–0.07)
Basophils Absolute: 0.1 10*3/uL (ref 0.0–0.1)
Basophils Relative: 0 %
Eosinophils Absolute: 0.1 10*3/uL (ref 0.0–0.5)
Eosinophils Relative: 0 %
HCT: 53.5 % — ABNORMAL HIGH (ref 39.0–52.0)
Hemoglobin: 17 g/dL (ref 13.0–17.0)
Immature Granulocytes: 1 %
Lymphocytes Relative: 3 %
Lymphs Abs: 0.8 10*3/uL (ref 0.7–4.0)
MCH: 28.1 pg (ref 26.0–34.0)
MCHC: 31.8 g/dL (ref 30.0–36.0)
MCV: 88.6 fL (ref 80.0–100.0)
Monocytes Absolute: 1.6 10*3/uL — ABNORMAL HIGH (ref 0.1–1.0)
Monocytes Relative: 6 %
Neutro Abs: 23.9 10*3/uL — ABNORMAL HIGH (ref 1.7–7.7)
Neutrophils Relative %: 90 %
Platelets: 346 10*3/uL (ref 150–400)
RBC: 6.04 MIL/uL — ABNORMAL HIGH (ref 4.22–5.81)
RDW: 19.8 % — ABNORMAL HIGH (ref 11.5–15.5)
WBC: 26.8 10*3/uL — ABNORMAL HIGH (ref 4.0–10.5)
nRBC: 0 % (ref 0.0–0.2)

## 2021-01-17 LAB — COOXEMETRY PANEL
Carboxyhemoglobin: 0.7 % (ref 0.5–1.5)
Methemoglobin: 1.4 % (ref 0.0–1.5)
O2 Saturation: 63.6 %
Total hemoglobin: 17.9 g/dL — ABNORMAL HIGH (ref 12.0–16.0)

## 2021-01-17 LAB — COMPREHENSIVE METABOLIC PANEL
ALT: 22 U/L (ref 0–44)
AST: 20 U/L (ref 15–41)
Albumin: 2 g/dL — ABNORMAL LOW (ref 3.5–5.0)
Alkaline Phosphatase: 71 U/L (ref 38–126)
Anion gap: 10 (ref 5–15)
BUN: 82 mg/dL — ABNORMAL HIGH (ref 6–20)
CO2: 28 mmol/L (ref 22–32)
Calcium: 8.7 mg/dL — ABNORMAL LOW (ref 8.9–10.3)
Chloride: 97 mmol/L — ABNORMAL LOW (ref 98–111)
Creatinine, Ser: 1.87 mg/dL — ABNORMAL HIGH (ref 0.61–1.24)
GFR, Estimated: 41 mL/min — ABNORMAL LOW (ref >60)
Glucose, Bld: 248 mg/dL — ABNORMAL HIGH (ref 70–99)
Potassium: 4.9 mmol/L (ref 3.5–5.1)
Sodium: 135 mmol/L (ref 135–145)
Total Bilirubin: 0.7 mg/dL (ref 0.3–1.2)
Total Protein: 7.4 g/dL (ref 6.5–8.1)

## 2021-01-17 LAB — HEMOGLOBIN A1C
Hgb A1c MFr Bld: 10.3 % — ABNORMAL HIGH (ref 4.8–5.6)
Mean Plasma Glucose: 249 mg/dL

## 2021-01-17 LAB — LACTIC ACID, PLASMA: Lactic Acid, Venous: 1.8 mmol/L (ref 0.5–1.9)

## 2021-01-17 LAB — PROCALCITONIN: Procalcitonin: 6.72 ng/mL

## 2021-01-17 MED ORDER — CHLORHEXIDINE GLUCONATE CLOTH 2 % EX PADS
6.0000 | MEDICATED_PAD | Freq: Every day | CUTANEOUS | Status: DC
  Administered 2021-01-18 – 2021-01-26 (×8): 6 via TOPICAL

## 2021-01-17 MED ORDER — IPRATROPIUM-ALBUTEROL 0.5-2.5 (3) MG/3ML IN SOLN
3.0000 mL | Freq: Two times a day (BID) | RESPIRATORY_TRACT | Status: DC
  Administered 2021-01-17 – 2021-01-24 (×12): 3 mL via RESPIRATORY_TRACT
  Filled 2021-01-17 (×13): qty 3

## 2021-01-17 MED ORDER — ENSURE MAX PROTEIN PO LIQD
11.0000 [oz_av] | Freq: Every day | ORAL | Status: DC
  Administered 2021-01-17 – 2021-01-24 (×3): 11 [oz_av] via ORAL
  Filled 2021-01-17 (×8): qty 330

## 2021-01-17 MED ORDER — SODIUM CHLORIDE 0.9% FLUSH
10.0000 mL | Freq: Two times a day (BID) | INTRAVENOUS | Status: DC
Start: 2021-01-17 — End: 2021-01-27
  Administered 2021-01-17 – 2021-01-18 (×2): 10 mL
  Administered 2021-01-18: 20 mL
  Administered 2021-01-19 – 2021-01-27 (×12): 10 mL

## 2021-01-17 MED ORDER — SODIUM CHLORIDE 0.9% FLUSH
10.0000 mL | INTRAVENOUS | Status: DC | PRN
  Administered 2021-01-25: 10 mL

## 2021-01-17 MED ORDER — ADULT MULTIVITAMIN W/MINERALS CH
1.0000 | ORAL_TABLET | Freq: Every day | ORAL | Status: DC
  Administered 2021-01-18 – 2021-01-27 (×10): 1 via ORAL
  Filled 2021-01-17 (×10): qty 1

## 2021-01-17 MED ORDER — LIDOCAINE VISCOUS HCL 2 % MT SOLN
15.0000 mL | Freq: Once | OROMUCOSAL | Status: AC
  Administered 2021-01-18: 15 mL via ORAL
  Filled 2021-01-17: qty 15

## 2021-01-17 MED ORDER — PANTOPRAZOLE SODIUM 40 MG PO TBEC
40.0000 mg | DELAYED_RELEASE_TABLET | Freq: Every day | ORAL | Status: DC
  Administered 2021-01-17 – 2021-01-18 (×2): 40 mg via ORAL
  Filled 2021-01-17 (×2): qty 1

## 2021-01-17 MED ORDER — ALUM & MAG HYDROXIDE-SIMETH 200-200-20 MG/5ML PO SUSP
30.0000 mL | Freq: Once | ORAL | Status: AC
  Administered 2021-01-17: 30 mL via ORAL
  Filled 2021-01-17: qty 30

## 2021-01-17 MED ORDER — GLUCERNA SHAKE PO LIQD
237.0000 mL | Freq: Two times a day (BID) | ORAL | Status: DC
  Administered 2021-01-18 – 2021-01-22 (×8): 237 mL via ORAL

## 2021-01-17 NOTE — Plan of Care (Signed)

## 2021-01-17 NOTE — Progress Notes (Signed)
Pt sts he recently walked in room with OT. Discussed/reviewed HF management and gave him new booklet. He sts he weighs daily and doesn't use salt. Discussed importance of reading labels on packaged food and keeping sodium below 2000 mg a day. Also discussed fluid restriction. Pt tangential at times. [id number] Ethelda Chick CES, ACSM 2:21 PM 01/17/2021

## 2021-01-17 NOTE — Progress Notes (Signed)
Peripherally Inserted Central Catheter Placement  The IV Nurse has discussed with the patient and/or persons authorized to consent for the patient, the purpose of this procedure and the potential benefits and risks involved with this procedure.  The benefits include less needle sticks, lab draws from the catheter, and the patient may be discharged home with the catheter. Risks include, but not limited to, infection, bleeding, blood clot (thrombus formation), and puncture of an artery; nerve damage and irregular heartbeat and possibility to perform a PICC exchange if needed/ordered by physician.  Alternatives to this procedure were also discussed.  Bard Power PICC patient education guide, fact sheet on infection prevention and patient information card has been provided to patient /or left at bedside.    PICC Placement Documentation  PICC Double Lumen 01/17/21 PICC Right Brachial 47 cm 0 cm (Active)  Indication for Insertion or Continuance of Line Vasoactive infusions 01/17/21 1753  Exposed Catheter (cm) 0 cm 01/17/21 1753  Site Assessment Clean;Dry;Intact 01/17/21 1753  Lumen #1 Status Flushed;Blood return noted;Saline locked 01/17/21 1753  Lumen #2 Status Flushed;Blood return noted;Saline locked 01/17/21 1753  Dressing Type Transparent 01/17/21 1753  Dressing Status Clean;Dry;Intact 01/17/21 1753  Antimicrobial disc in place? Yes 01/17/21 1753  Dressing Change Due 01/24/21 01/17/21 1753       Audrie Gallus 01/17/2021, 5:55 PM

## 2021-01-17 NOTE — Progress Notes (Addendum)
Inpatient Diabetes Program Recommendations  AACE/ADA: New Consensus Statement on Inpatient Glycemic Control (2015)  Target Ranges:  Prepandial:   less than 140 mg/dL      Peak postprandial:   less than 180 mg/dL (1-2 hours)      Critically ill patients:  140 - 180 mg/dL   Lab Results  Component Value Date   GLUCAP 230 (H) 01/17/2021   HGBA1C 10.4 (H) 10/31/2017    Review of Glycemic Control Results for Richard [name], Richard [name] (MRN [id number]) as of 01/17/2021 14:35  Ref. Range 01/16/2021 16:28 01/16/2021 20:44 01/16/2021 23:54 01/17/2021 04:00 01/17/2021 11:20  Glucose-Capillary Latest Ref Range: 70 - 99 mg/dL 339 (H) 322 (H) 302 (H) 259 (H) 230 (H)   Diabetes history: Type 2 DM Outpatient Diabetes medications: Humalog U-500 SSI, Jardiance 25 mg QD Current orders for Inpatient glycemic control: Lantus 10 units BID, Novolog 6 units TID, Novolog 0-20 units & HS Prednisone 40 mg QAM Inpatient Diabetes Program Recommendations:    Consider increasing Lantus to 14 units BID.   Spoke with patient regarding diabetes management.  Patient is followed by Dr Posey Pronto, last appointment was in 07/19/20. Has experienced difficulty obtaining Freestyle Libres and ability to keep them on for 14 days d/t placement. Reports compliance with injections three times daily and denies missing doses. Throughout conversation, patient tangential and at times difficult to redirect. Reviewed pending A1C status and anticipating result to be elevated. Explained what a A1c is and what it measures. Also reviewed goal A1c with patient, importance of good glucose control @ home, and blood sugar goals. Reviewed patho of DM, need for insulin, vascular changes and commorbidities.  Patient needs a glucose meter at discharge. Patient reports that he has an older one that is no longer functioning appropriately. Blood glucose meter kit (#[id number]). Reviewed frequency of CBG checks.  When discussing sugary intake patient states, "I haven't been  drinking sugary drinks but my wife gives them to me and I know its because she wants me gone."   Reviewed importance of intake of sugary beverages, excessive CHO intake, encouraged mindfulness and as long as it is safe for him to work towards independently obtaining beverages to have more self control.  Patient to follow back with Dr Posey Pronto this month (per patient) and has no further questions.   Thanks, Bronson Curb, MSN, RNC-OB Diabetes Coordinator [phone] (8a-5p)

## 2021-01-17 NOTE — Progress Notes (Signed)
Physical Therapy Treatment Patient Details Name: Richard [name] MRN: [id number] DOB: 1961-03-27 Today's Date: 01/17/2021    History of Present Illness Pt is a 60 y.o. male who presented 01/15/21 with increased SOB and body aches. Admitted for COPD exacerbation vs CHF exacerbation. Per chart: He was hospitalized at [facility] in April of this year for CHF exacerbation requiring aggressive diuresis. Chronic leg wounds followed by wound care. PMH: DM2, COPD on chronic O2 2 L, systolic CHF sp AICD, venous stasis ulcer, HTN, CKD, pulmonary nodule (hamartoma)    PT Comments    Pt required min assist bed mobility, min assist transfers, and min assist ambulation 100' with RW. Pt on 6L continuous O2 via [place] throughout session. SpO2 maintained > 90%. Pt in recliner with feet elevated at end of session.     Follow Up Recommendations  Outpatient PT;Supervision for mobility/OOB     Equipment Recommendations  Rolling walker with 5" wheels    Recommendations for Other Services       Precautions / Restrictions Precautions Precautions: Fall;Other (comment) Precaution Comments: monitor vitals    Mobility  Bed Mobility Overal bed mobility: Needs Assistance Bed Mobility: Supine to Sit     Supine to sit: Min assist;HOB elevated     General bed mobility comments: cues for sequencing, increased time, +rail, assist to elevate trunk    Transfers Overall transfer level: Needs assistance Equipment used: Rolling walker (2 wheeled) Transfers: Sit to/from Stand Sit to Stand: Min assist;From elevated surface         General transfer comment: cues for hand placement, assist to power up  Ambulation/Gait Ambulation/Gait assistance: Min assist Gait Distance (Feet): 100 Feet Assistive device: Rolling walker (2 wheeled) Gait Pattern/deviations: Step-through pattern;Decreased stride length Gait velocity: decreased Gait velocity interpretation: 1.31 - 2.62 ft/sec, indicative of limited community  ambulator General Gait Details: Mildly unsteady gait. Mobilized on 6L with SpO2 > 90%. No SOB/DOE noted. Pt fatigued upon return to room.   Stairs             Wheelchair Mobility    Modified Rankin (Stroke Patients Only)       Balance Overall balance assessment: Needs assistance Sitting-balance support: No upper extremity supported;Feet supported Sitting balance-Leahy Scale: Good     Standing balance support: Bilateral upper extremity supported;During functional activity Standing balance-Leahy Scale: Poor Standing balance comment: Reliant on UE support for mobility.                            Cognition Arousal/Alertness: Awake/alert Behavior During Therapy: WFL for tasks assessed/performed Overall Cognitive Status: No family/caregiver present to determine baseline cognitive functioning                                 General Comments: confusion noted. Easily distracted. Cues to stay on task. Decreased safety awareness.      Exercises      General Comments General comments (skin integrity, edema, etc.): SpO2 > 90% on 6L O2 via [place].      Pertinent Vitals/Pain Pain Assessment: Faces Faces Pain Scale: Hurts a little bit Pain Location: L foot Pain Descriptors / Indicators: Discomfort Pain Intervention(s): Monitored during session;Limited activity within patient's tolerance;Repositioned    Home Living                      Prior Function  PT Goals (current goals can now be found in the care plan section) Acute Rehab PT Goals Patient Stated Goal: to get better Progress towards PT goals: Progressing toward goals    Frequency    Min 3X/week      PT Plan Current plan remains appropriate    Co-evaluation              AM-PAC PT "6 Clicks" Mobility   Outcome Measure  Help needed turning from your back to your side while in a flat bed without using bedrails?: A Little Help needed moving from lying on  your back to sitting on the side of a flat bed without using bedrails?: A Little Help needed moving to and from a bed to a chair (including a wheelchair)?: A Little Help needed standing up from a chair using your arms (e.g., wheelchair or bedside chair)?: A Little Help needed to walk in hospital room?: A Little Help needed climbing 3-5 steps with a railing? : A Lot 6 Click Score: 17    End of Session Equipment Utilized During Treatment: Gait belt;Oxygen Activity Tolerance: Patient tolerated treatment well Patient left: in chair;with call bell/phone within reach;with chair alarm set Nurse Communication: Mobility status PT Visit Diagnosis: Unsteadiness on feet (R26.81);Other abnormalities of gait and mobility (R26.89);Muscle weakness (generalized) (M62.81);Difficulty in walking, not elsewhere classified (R26.2)     Time: 5364-6803 PT Time Calculation (min) (ACUTE ONLY): 28 min  Charges:  $Gait Training: 23-37 mins                     Aida Raider, [place]  Office # [phone] Pager [phone]    Ilda Foil 01/17/2021, 10:18 AM

## 2021-01-17 NOTE — Progress Notes (Signed)
RT at bedside to place patient on BIPAP. Patient refused at this time. RN aware.

## 2021-01-17 NOTE — Plan of Care (Signed)
  Problem: Education: Goal: Knowledge of General Education information will improve Description: Including pain rating scale, medication(s)/side effects and non-pharmacologic comfort measures Outcome: Progressing   Problem: Clinical Measurements: Goal: Ability to maintain clinical measurements within normal limits will improve 01/17/2021 0406 by Nicole Cella, RN Outcome: Progressing 01/17/2021 0406 by Nicole Cella, RN Outcome: Progressing Goal: Respiratory complications will improve 01/17/2021 0406 by Nicole Cella, RN Outcome: Progressing 01/17/2021 0406 by Nicole Cella, RN Outcome: Progressing

## 2021-01-17 NOTE — Evaluation (Signed)
Occupational Therapy Evaluation Patient Details Name: Richard [name] MRN: [id number] DOB: 07-24-1960 Today's Date: 01/17/2021    History of Present Illness Pt is a 60 y.o. male who presented 01/15/21 with increased SOB and body aches. Admitted for COPD exacerbation vs CHF exacerbation. Per chart: He was hospitalized at [facility] in April of this year for CHF exacerbation requiring aggressive diuresis. Chronic leg wounds followed by wound care. PMH: DM2, COPD on chronic O2 2 L, systolic CHF sp AICD, venous stasis ulcer, HTN, CKD, pulmonary nodule (hamartoma)   Clinical Impression   PTA, pt lives with family and reports typically Independent with ADLs, IADLs and mobility at baseline. Pt with deficits in standing balance, cardiopulmonary tolerance, strength and cognition. Pt able to demo mobility with RW vs IV pole at min guard in room. Pt overall Supervision for UB ADLS and min guard for LB ADLs due to deficits. Pt noted with some difficulty attending to tasks and problem solving. Poor SpO2 signal on 6 L O2 though pt denies SOB. Anticipate no OT needs at DC, but would benefit from acute OT to maximize safety/independence.     Follow Up Recommendations  No OT follow up;Supervision - Intermittent    Equipment Recommendations  3 in 1 bedside commode;Other (comment) (Rolling walker)    Recommendations for Other Services       Precautions / Restrictions Precautions Precautions: Fall;Other (comment) Precaution Comments: monitor vitals Restrictions Weight Bearing Restrictions: No      Mobility Bed Mobility Overal bed mobility: Needs Assistance Bed Mobility: Supine to Sit     Supine to sit: Min assist;HOB elevated Sit to supine: Supervision   General bed mobility comments: Min A via handheld assist to sit EOB, no assist to get back in bed    Transfers Overall transfer level: Needs assistance Equipment used: Rolling walker (2 wheeled);None Transfers: Sit to/from Stand Sit to Stand:  Min guard         General transfer comment: min guard with cues for RW hand placement. able to stand without AD the second time. Trialed with use of IV pole with mobility appearing similar    Balance Overall balance assessment: Needs assistance Sitting-balance support: No upper extremity supported;Feet supported Sitting balance-Leahy Scale: Good     Standing balance support: Single extremity supported;During functional activity;Bilateral upper extremity supported Standing balance-Leahy Scale: Fair Standing balance comment: fair static standing, use of at least one UE support for mobility                           ADL either performed or assessed with clinical judgement   ADL Overall ADL's : Needs assistance/impaired Eating/Feeding: Independent;Sitting   Grooming: Supervision/safety;Standing   Upper Body Bathing: Supervision/ safety;Sitting   Lower Body Bathing: Min guard;Sit to/from stand   Upper Body Dressing : Supervision/safety;Sitting   Lower Body Dressing: Min guard;Sit to/from stand   Toilet Transfer: Min guard;Ambulation   Toileting- Clothing Manipulation and Hygiene: Min guard;Sit to/from stand       Functional mobility during ADLs: Min guard General ADL Comments: Pt with questionable cognitive deficits (attention, problem solving) and decreased health literacy. Denies SOB with activity. RW vs IV pole mobility feels similar per pt     Vision Patient Visual Report: No change from baseline Vision Assessment?: No apparent visual deficits     Perception     Praxis      Pertinent Vitals/Pain Pain Assessment: No/denies pain Faces Pain Scale: Hurts a little bit Pain  Location: L foot Pain Descriptors / Indicators: Discomfort Pain Intervention(s): Monitored during session;Limited activity within patient's tolerance;Repositioned     Hand Dominance Right   Extremity/Trunk Assessment Upper Extremity Assessment Upper Extremity Assessment: Overall  WFL for tasks assessed   Lower Extremity Assessment Lower Extremity Assessment: Defer to PT evaluation   Cervical / Trunk Assessment Cervical / Trunk Assessment: Normal   Communication Communication Communication: No difficulties   Cognition Arousal/Alertness: Awake/alert Behavior During Therapy: WFL for tasks assessed/performed Overall Cognitive Status: No family/caregiver present to determine baseline cognitive functioning Area of Impairment: Attention;Memory;Following commands;Safety/judgement;Awareness;Problem solving                   Current Attention Level: Selective Memory: Decreased short-term memory Following Commands: Follows one step commands with increased time Safety/Judgement: Decreased awareness of deficits Awareness: Emergent Problem Solving: Slow processing;Difficulty sequencing;Requires verbal cues;Requires tactile cues General Comments: confusion noted. Easily distracted. Cues to stay on task. Decreased safety awarenes and slower problem solving. frequent tangential conversation   General Comments  Variable O2 readings with poor signal (cold digits, trialed ear probe with same signal). Pt denies SOB, HR to 129bpm with activity    Exercises     Shoulder Instructions      Home Living Family/patient expects to be discharged to:: Private residence Living Arrangements: Spouse/significant other;Children Available Help at Discharge: Family;Available PRN/intermittently Type of Home: House Home Access: Stairs to enter [employer] of Steps: 3 Entrance Stairs-Rails: None Home Layout: Two level;Able to live on main level with bedroom/bathroom;Full bath on main level     Bathroom Shower/Tub: Chief Strategy Officer: Standard     Home Equipment: None;Other (comment) (walking stick)          Prior Functioning/Environment Level of Independence: Independent        Comments: Does not work. Drives.        OT Problem List:  Decreased activity tolerance;Impaired balance (sitting and/or standing);Decreased cognition;Decreased knowledge of use of DME or AE;Cardiopulmonary status limiting activity;Decreased safety awareness      OT Treatment/Interventions: Self-care/ADL training;Therapeutic exercise;Energy conservation;DME and/or AE instruction;Therapeutic activities;Patient/family education;Balance training    OT Goals(Current goals can be found in the care plan section) Acute Rehab OT Goals Patient Stated Goal: to get better OT Goal Formulation: With patient Time For Goal Achievement: 01/31/21 Potential to Achieve Goals: Good  OT Frequency: Min 2X/week   Barriers to D/C:            Co-evaluation              AM-PAC OT "6 Clicks" Daily Activity     Outcome Measure Help from another person eating meals?: None Help from another person taking care of personal grooming?: A Little Help from another person toileting, which includes using toliet, bedpan, or urinal?: A Little Help from another person bathing (including washing, rinsing, drying)?: A Little Help from another person to put on and taking off regular upper body clothing?: A Little Help from another person to put on and taking off regular lower body clothing?: A Little 6 Click Score: 19   End of Session Equipment Utilized During Treatment: Oxygen;Rolling walker Nurse Communication: Mobility status;Other (comment) (O2)  Activity Tolerance: Patient tolerated treatment well Patient left: with call bell/phone within reach;with bed alarm set;in bed  OT Visit Diagnosis: Unsteadiness on feet (R26.81);Other abnormalities of gait and mobility (R26.89)                Time: 8563-1497 OT Time Calculation (min): 40 min  Charges:  OT General Charges $OT Visit: 1 Visit OT Evaluation $OT Eval Moderate Complexity: 1 Mod OT Treatments $Self Care/Home Management : 8-22 mins $Therapeutic Activity: 8-22 mins  Bradd Canary, OTR/L Acute Rehab Services Office:  [phone]   Lorre Munroe 01/17/2021, 1:38 PM

## 2021-01-17 NOTE — Consult Note (Addendum)
Advanced Heart Failure Team Consult Note   Primary Physician: Ananias Pilgrim, MD PCP-Cardiologist:  Dr. Shirlee Latch  Reason for Consultation: Acute on chronic systolic CHF  HPI:    Richard [name] is seen today for evaluation of acute on chronic systolic CHF at the request of Dr. Elberta Fortis with Cardiology. He has a history of chronic systolic HF dating back to 2017, prior ICD placement in 2018, CAD with PCI/DES to RCA in 2019. He had a PYP scan in 2021 which was negative for TTR cardiac amyloidosis.Most recent EF 20-25% on echo January 2022.  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  He was admitted April 2022 with acute on chronic CHF. He was volume overloaded at f/u visits in May and most recently June 7th. At that time, he was given 80 mg lasix IV and home po torsemide dose increased for 2 days. He has been followed by paramedicine. Missed clinic f/u on 07/05 and 07/22. Has not returned calls to schedule home visits.  Admitted on 07/23 with acute on chronic hypoxic respiratory failure secondary to suspected COPD exacerbation and acute on chronic CHF. Initially required BiPAP. BNP 532. Hs Trop 37 X 2.  Na 131, K 4.8, Cr 2.26>2.37 >1.87 (baseline 1.3-1.5), WBC 22.9, Hgb 17.8, PLT 276, Lactate 2.4>2.1>1.8. COVID/influenza negative. Rhythm was sinus tachycardia. Chest x-ray with evidence of CHF. Diuresed with IV lasix. GDMT held d/t AKI and hypotension. Cardiology consulted. Initially had planned to place PICC to monitor Co-ox/CVP, but Bcx grew staph and strep. ID felt may be contaminant. Subsequent blood culture X 2 with no growth X 1 day. Procal 12.4 > 6.72. Currently on IV antibiotics for possible bacteremia.  Continues on IV lasix. Charted weight actually up 7lb from yesterday, but down 20 lb from last visit. Reports he did not eat for about 5 days prior to  admission. He states his abdomen is less distended today. No edema. Denies chest pain. No dyspnea at rest.   Frequently misses evening doses of medicines.  Cardiac studies:  Echo 01/16/2021: EF 20-25%, anterior, anteroseptal, anterolateral walls akinetic, apex akinetic, low normal to mildly decreased RV function, no significant valvular disease  Echo, 07/08/2020: EF 20-25%, global LV hypokinesis, RV function mildly reduced  Review of Systems: [y] = yes, [ ]  = no   General: Weight gain [ ] ; Weight loss [X] ; Anorexia [ ] ; Fatigue [X] ; Fever [ ] ; Chills [ ] ; Weakness [ ]   Cardiac: Chest pain/pressure [ ] ; Resting SOB [ ] ; Exertional SOB [ ] ; Orthopnea [ ] ; Pedal Edema [ ] ; Palpitations [ ] ; Syncope [ ] ; Presyncope [ ] ; Paroxysmal nocturnal dyspnea[ ]   Pulmonary: Cough [X] ; Wheezing[ ] ; Hemoptysis[ ] ; Sputum [ ] ; Snoring [ ]   GI: Vomiting[ ] ; Dysphagia[ ] ; Melena[ ] ; Hematochezia [ ] ; Heartburn[ ] ; Abdominal pain [ ] ; Constipation [ ] ; Diarrhea [ ] ; BRBPR [ ]   GU: Hematuria[ ] ; Dysuria [ ] ; Nocturia[ ]   Vascular: Pain in legs with walking [ ] ; Pain in feet with lying flat [ ] ; Non-healing sores [X] ; Stroke [ ] ; TIA [ ] ; Slurred speech [ ] ;  Neuro: Headaches[ ] ; Vertigo[ ] ; Seizures[ ] ; Paresthesias[ ] ;Blurred vision [ ] ; Diplopia [ ] ; Vision changes [ ]   Ortho/Skin: Arthritis [ ] ; Joint pain [ ] ; Muscle pain [ ] ; Joint swelling [ ] ; Back Pain [ ] ; Rash [ ]   Psych:  Depression[ ] ; Anxiety[ ]   Heme: Bleeding problems [ ] ; Clotting disorders [ ] ; Anemia [ ]   Endocrine: Diabetes [X] ; Thyroid dysfunction[ ]   Home Medications Prior to Admission medications   Medication Sig Start Date End Date Taking? Authorizing Provider  acetaminophen (TYLENOL) 650 MG CR tablet Take 650 mg by mouth every 8 (eight) hours as needed for pain.   Yes [provider]  albuterol (VENTOLIN HFA) 108 (90 Base) MCG/ACT inhaler Inhale 1-2 puffs into the lungs every 4 (four) hours as needed for wheezing or shortness of  breath. 10/17/20  Yes Alessandra Bevels, MD  allopurinol (ZYLOPRIM) 100 MG tablet Take 1 tablet (100 mg total) by mouth daily. 12/26/17  Yes Clegg, Amy D, NP  aspirin 81 MG chewable tablet Chew 81 mg by mouth daily.   Yes [provider]  atorvastatin (LIPITOR) 40 MG tablet TAKE ONE TABLET BY MOUTH DAILY AT 6PM Patient taking differently: Take 40 mg by mouth daily. 11/15/20  Yes Laurey Morale, MD  bisoprolol (ZEBETA) 5 MG tablet Take 2.5 mg by mouth daily.   Yes [provider]  BREO ELLIPTA 100-25 MCG/INH AEPB Inhale 1 puff into the lungs daily. 01/03/21  Yes [provider]  busPIRone (BUSPAR) 5 MG tablet Take 5 mg by mouth 2 (two) times daily.   Yes [provider]  digoxin (LANOXIN) 0.125 MG tablet TAKE 1 TABLET(0.125 MG) BY MOUTH DAILY 04/28/20  Yes Laurey Morale, MD  docusate sodium (COLACE) 100 MG capsule Take 100 mg by mouth daily as needed for mild constipation.   Yes [provider]  DULoxetine (CYMBALTA) 60 MG capsule Take 60 mg by mouth daily. 09/28/17  Yes [provider]  eplerenone (INSPRA) 50 MG tablet Take 1 tablet (50 mg total) by mouth daily. 07/27/20  Yes Laurey Morale, MD  fenofibrate (TRICOR) 145 MG tablet TAKE ONE TABLET BY MOUTH DAILY Patient taking differently: Take 145 mg by mouth daily. 12/15/20  Yes Laurey Morale, MD  hydrALAZINE (APRESOLINE) 50 MG tablet Take 1.5 tablets (75 mg total) by mouth 3 (three) times daily. 09/09/20  Yes Laurey Morale, MD  insulin regular human CONCENTRATED (HUMULIN R) 500 UNIT/ML kwikpen Inject 0-400 Units into the skin as directed. Sliding 07/18/17  Yes [provider]  JARDIANCE 25 MG TABS tablet Take 25 mg by mouth daily. 10/15/19  Yes Laurey Morale, MD  MAGNESIUM-OXIDE 400 (241.3 Mg) MG tablet Take 1 tablet (400 mg total) by mouth daily. 08/27/20  Yes Laurey Morale, MD  pregabalin (LYRICA) 150 MG capsule Take 150 mg by mouth 3 (three) times daily. 10/07/20  Yes [provider]  sacubitril-valsartan (ENTRESTO) 97-103 MG Take 1 tablet by mouth 2 (two) times daily. 03/17/20  Yes Laurey Morale, MD  SSD 1 % cream Apply 1 application topically daily as needed for wound care. 12/20/20  Yes [provider]  Torsemide 40 MG TABS Take 80 mg by mouth in the morning AND 60 mg every evening. 12/07/20  Yes Laurey Morale, MD  icosapent Ethyl (VASCEPA) 1 g capsule Take 2 capsules (2 g total) by mouth 2 (two) times daily. Patient not taking: No sig reported 10/08/20   Laurey Morale, MD  Insulin Pen Needle (NOVOFINE) 30G X 8 MM MISC Inject 10 each into the skin as needed. 11/06/17   Arnetha Courser, MD  isosorbide dinitrate (ISORDIL) 20 MG tablet TAKE TWO TABLETS BY MOUTH THREE TIMES A DAY 12/15/20   Marca Ancona  S, MD  omeprazole (PRILOSEC) 20 MG capsule TAKE ONE CAPSULE BY MOUTH DAILY Patient not taking: No sig reported 10/20/20   Laurey Morale, MD    Past Medical History: Past Medical History:  Diagnosis Date   AICD (automatic cardioverter/defibrillator) present 01/11/2017   Anxiety    CHF (congestive heart failure) (HCC)    CKD (chronic kidney disease)    COPD (chronic obstructive pulmonary disease) (HCC)    never smoker, industrial exposure   Diabetes mellitus with diabetic neuropathy, with long-term current use of insulin (HCC)    GERD (gastroesophageal reflux disease)    High cholesterol    History of gout    "take RX qd" (11/01/2017)   Hypertension    Nonobstructive atherosclerosis of coronary artery    On home oxygen therapy    "2L prn" (11/01/2017)   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HCC)    LLL, present for years   Systolic HF (heart failure) (HCC)     Past Surgical History: Past Surgical History:  Procedure Laterality Date   CATARACT EXTRACTION W/ INTRAOCULAR LENS IMPLANTW/ TRABECULECTOMY Left ~ 2013   "may have done this when I had my other eye OR"   CIRCUMCISION     CORONARY STENT INTERVENTION N/A 11/02/2017   Procedure: CORONARY STENT  INTERVENTION;  Surgeon: Laurey Morale, MD;  Location: [facility] INVASIVE CV LAB;  Service: Cardiovascular;  Laterality: N/A;   CORONARY STENT INTERVENTION N/A 11/02/2017   Procedure: CORONARY STENT INTERVENTION;  Surgeon: Lyn Records, MD;  Location: [facility] INVASIVE CV LAB;  Service: Cardiovascular;  Laterality: N/A;   EYE SURGERY Left ~ 2013   "fell on something in basement; stuck in my eye"   ICD IMPLANT     Medtronic Dual Chamber 01/11/17   MULTIPLE TOOTH EXTRACTIONS     "pulled all my top teeth"   RIGHT/LEFT HEART CATH AND CORONARY ANGIOGRAPHY N/A 11/02/2017   Procedure: RIGHT/LEFT HEART CATH AND CORONARY ANGIOGRAPHY;  Surgeon: Laurey Morale, MD;  [facility] INVASIVE CV LAB;  Service: Cardiovascular;  Laterality: N/A;    Family History: Family History  Problem Relation Age of Onset   Hypertension Mother    Diabetes Mother    Hypertension Father    Diabetes Father    Diabetes Sister    Diabetes Brother     Social History: Social History   Socioeconomic History   Marital status: Divorced    Spouse name: Not on file   Number of children: Not on file   Years of education: Not on file   Highest education level: Not on file  Occupational History   Occupation: disability    Comment: stopped working in 2000 d/t occupational exposures  Tobacco Use   Smoking status: Former    Types: Cigarettes   Smokeless tobacco: Never   Tobacco comments:    "smoked when I drank"  Vaping Use   Vaping Use: Never used  Substance and Sexual Activity   Alcohol use: Not Currently    Comment: 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  Other Topics Concern   Not on file  Social History Narrative   Patient lives at home with wife and some of his 10 children. He self-administers his own medications.   Social Determinants of Health   Financial Resource Strain: Low Risk    Difficulty of Paying Living Expenses: Not very hard  Food  Insecurity: No Food Insecurity  Worried About Programme researcher, broadcasting/film/videounning Out of Food in the Last Year: Never true   [employer] Out of Food in the Last Year: Never true  Transportation Needs: No Transportation Needs   Lack of Transportation (Medical): No   Lack of Transportation (Non-Medical): No  Physical Activity: Not on file  Stress: Not on file  Social Connections: Not on file    Allergies:  Allergies  Allergen Reactions   Aspirin Itching   Codeine Hives   Coconut Oil Itching    Objective:    Vital Signs:   Temp:  [97.7 F (36.5 C)-98.4 F (36.9 C)] 98 F (36.7 C) (07/24 1946) Pulse Rate:  [63-90] 90 (07/25 0402) Resp:  [14-20] 14 (07/25 0402) BP: (78-108)/(56-82) 108/82 (07/25 0402) SpO2:  [93 %-99 %] 95 % (07/25 0402) FiO2 (%):  [40 %] 40 % (07/25 0325) Weight:  [117.5 kg] 117.5 kg (07/25 0616) Last BM Date:  (pta)  Weight change: Filed Weights   01/16/21 0558 01/17/21 0616  Weight: 114.4 kg 117.5 kg    Intake/Output:   Intake/Output Summary (Last 24 hours) at 01/17/2021 0849 Last data filed at 01/17/2021 [id number] Gross per 24 hour  Intake 1587 ml  Output 3175 ml  Net -1588 ml      Physical Exam    General:  Chronically ill appearing. No resp difficulty on 5L 02 Why HEENT: normal Neck: supple. JVP difficult to assess but does not appear elevated. Carotids 2+ bilat; no bruits. No lymphadenopathy or thyromegaly appreciated. Cor: PMI nondisplaced. Regular rate & rhythm. No rubs, gallops or murmurs. Lungs: diminished throughout Abdomen: soft, nontender, nondistended. No hepatosplenomegaly. No bruits or masses. Good bowel sounds. GU: Foley in place. Extremities: no cyanosis, clubbing, rash, trace edema. Compression dressings on BLE Neuro: alert & orientedx3, cranial nerves grossly intact. moves all 4 extremities w/o difficulty. Affect pleasant   Telemetry  NSR, 60s-70s (personally reviewed)   EKG    Sinus tachycardia with rate 108 bpm, IVCD, NSSTTWC  Labs   Basic Metabolic  Panel: Recent Labs  Lab 01/15/21 1545 01/15/21 1716 01/15/21 2045 01/15/21 2307 01/16/21 0323 01/17/21 0538  NA 131* 132* 134*  --  135 135  K 4.8 4.6 4.9  --  5.2* 4.9  CL 90*  --   --   --  93* 97*  CO2 25  --   --   --  27 28  GLUCOSE 362*  --   --   --  454* 248*  BUN 68*  --   --   --  77* 82*  CREATININE 2.26*  --   --   --  2.37* 1.87*  CALCIUM 8.9  --   --   --  8.9 8.7*  MG  --   --   --  2.5* 2.7*  --   PHOS  --   --   --  6.2* 6.9*  --     Liver Function Tests: Recent Labs  Lab 01/15/21 1545 01/16/21 0323 01/17/21 0538  AST 23 18 20   ALT 22 20 22   ALKPHOS 73 78 71  BILITOT 1.7* 1.2 0.7  PROT 8.1 7.6 7.4  ALBUMIN 2.5* 2.1* 2.0*   No results for input(s): LIPASE, AMYLASE in the last 168 hours. No results for input(s): AMMONIA in the last 168 hours.  CBC: Recent Labs  Lab 01/15/21 1545 01/15/21 1716 01/15/21 2045 01/16/21 0323 01/17/21 0538  WBC 22.9*  --   --  24.8* 26.8*  NEUTROABS 18.9*  --   --  22.5* 23.9*  HGB 17.8* 19.4* 19.0* 16.5 17.0  HCT 53.8* 57.0* 56.0* 51.3 53.5*  MCV 87.8  --   --  89.2 88.6  PLT 276  --   --  295 346    Cardiac Enzymes: Recent Labs  Lab 01/15/21 2307  CKTOTAL 101    BNP: BNP (last 3 results) Recent Labs    10/16/20 1810 11/09/20 1519 01/15/21 1545  BNP 329.7* 373.4* 532.8*    ProBNP (last 3 results) No results for input(s): PROBNP in the last 8760 hours.   CBG: Recent Labs  Lab 01/16/21 1134 01/16/21 1628 01/16/21 2044 01/16/21 2354 01/17/21 0400  GLUCAP 451* 339* 322* 302* 259*    Coagulation Studies: No results for input(s): LABPROT, INR in the last 72 hours.   Imaging   ECHOCARDIOGRAM COMPLETE  Result Date: 01/16/2021    ECHOCARDIOGRAM REPORT   Patient Name:   Richard [name] Date of Exam: 01/16/2021 Medical Rec #:  [id number]    Height:       77.0 in Accession #:    [id number]   Weight:       252.2 lb Date of Birth:  [birth year]   BSA:          2.469 m Patient Age:    59 years     BP:            95/60 mmHg Patient Gender: M            HR:           78 bpm. Exam Location:  Inpatient Procedure: 2D Echo, Cardiac Doppler and Color Doppler Indications:    chf  History:        Patient has prior history of Echocardiogram examinations, most                 recent 07/08/2020. CHF, Previous Myocardial Infarction,                 Defibrillator, COPD, Arrythmias:LBBB; Risk Factors:Hypertension,                 Former Smoker and Diabetes.  Sonographer:    Mikki Harbor Referring Phys: [id number] ANASTASSIA DOUTOVA IMPRESSIONS  1. The anterior,anteroseptal,anterolateral walls are akinetic. The apex is akinetic. Marland Kitchen Left ventricular ejection fraction, by estimation, is 20 to 25%. The left ventricle has severely decreased function. The left ventricle demonstrates regional wall motion abnormalities (see scoring diagram/findings for description). The left ventricular internal cavity size was moderately to severely dilated. Left ventricular diastolic parameters are indeterminate.  2. RV poorly visualized. Grossly appears mildly enlarged with low normal to mildly decreased function. Right ventricular systolic function was not well visualized. The right ventricular size is not well visualized. Tricuspid regurgitation signal is inadequate for assessing PA pressure.  3. Left atrial size was mildly dilated.  4. The mitral valve is normal in structure. No evidence of mitral valve regurgitation. No evidence of mitral stenosis.  5. The aortic valve was not well visualized. Aortic valve regurgitation is not visualized. No aortic stenosis is present. FINDINGS  Left Ventricle: The anterior,anteroseptal,anterolateral walls are akinetic. The apex is akinetic. Left ventricular ejection fraction, by estimation, is 20 to 25%. The left ventricle has severely decreased function. The left ventricle demonstrates regional wall motion abnormalities. Definity contrast agent was given IV to delineate the left ventricular endocardial borders. The  left ventricular internal cavity size was moderately to severely dilated. There is no left ventricular hypertrophy. Left ventricular diastolic parameters are indeterminate. Right  Ventricle: RV poorly visualized. Grossly appears mildly enlarged with low normal to mildly decreased function. The right ventricular size is not well visualized. Right vetricular wall thickness was not well visualized. Right ventricular systolic function was not well visualized. Tricuspid regurgitation signal is inadequate for assessing PA pressure. Left Atrium: Left atrial size was mildly dilated. Right Atrium: Right atrial size was normal in size. Pericardium: There is no evidence of pericardial effusion. Mitral Valve: The mitral valve is normal in structure. No evidence of mitral valve regurgitation. No evidence of mitral valve stenosis. MV peak gradient, 1.4 mmHg. The mean mitral valve gradient is 1.0 mmHg. Tricuspid Valve: The tricuspid valve is not well visualized. Tricuspid valve regurgitation is mild . No evidence of tricuspid stenosis. Aortic Valve: The aortic valve was not well visualized. Aortic valve regurgitation is not visualized. No aortic stenosis is present. Aortic valve mean gradient measures 3.0 mmHg. Aortic valve peak gradient measures 5.7 mmHg. Aortic valve area, by VTI measures 1.85 cm. Pulmonic Valve: The pulmonic valve was not well visualized. Pulmonic valve regurgitation is not visualized. No evidence of pulmonic stenosis. Aorta: The aortic root is normal in size and structure. IAS/Shunts: No atrial level shunt detected by color flow Doppler. Additional Comments: A device lead is visualized.  LEFT VENTRICLE PLAX 2D LVIDd:         6.80 cm  Diastology LVIDs:         6.00 cm  LV e' medial:   5.18 cm/s LV PW:         1.10 cm  LV E/e' medial: 10.3 LV IVS:        1.10 cm LVOT diam:     2.00 cm LV SV:         42 LV SV Index:   17 LVOT Area:     3.14 cm  RIGHT VENTRICLE RV Basal diam:  4.95 cm RV Mid diam:    6.10 cm RV S  prime:     8.85 cm/s TAPSE (M-mode): 2.4 cm LEFT ATRIUM             Index       RIGHT ATRIUM           Index LA diam:        4.00 cm 1.62 cm/m  RA Area:     22.50 cm LA Vol (A2C):   75.9 ml 30.75 ml/m RA Volume:   63.00 ml  25.52 ml/m LA Vol (A4C):   70.3 ml 28.48 ml/m LA Biplane Vol: 73.5 ml 29.77 ml/m  AORTIC VALVE AV Area (Vmax):    1.94 cm AV Area (Vmean):   1.77 cm AV Area (VTI):     1.85 cm AV Vmax:           119.00 cm/s AV Vmean:          84.800 cm/s AV VTI:            0.226 m AV Peak Grad:      5.7 mmHg AV Mean Grad:      3.0 mmHg LVOT Vmax:         73.30 cm/s LVOT Vmean:        47.900 cm/s LVOT VTI:          0.133 m LVOT/AV VTI ratio: 0.59  AORTA Ao Root diam: 3.60 cm Ao Asc diam:  3.20 cm MITRAL VALVE MV Area (PHT): 4.15 cm    SHUNTS MV Area VTI:   2.29 cm    Systemic VTI:  0.13 m MV Peak grad:  1.4 mmHg    Systemic Diam: 2.00 cm MV Mean grad:  1.0 mmHg MV Vmax:       0.59 m/s MV Vmean:      36.9 cm/s MV Decel Time: 183 msec MV E velocity: 53.30 cm/s MV A velocity: 41.80 cm/s MV E/A ratio:  1.28 Dina Rich MD Electronically signed by Dina Rich MD Signature Date/Time: 01/16/2021/4:43:15 PM    Final    [place] EKG SITE RITE  Result Date: 01/16/2021 If Site Rite image not attached, placement could not be confirmed due to current cardiac rhythm.    Medications:     Current Medications:  allopurinol  100 mg Oral Daily   aspirin EC  81 mg Oral Daily   atorvastatin  40 mg Oral Daily   docusate sodium  100 mg Oral BID   furosemide  60 mg Intravenous Q12H   heparin  5,000 Units Subcutaneous Q8H   insulin aspart  0-20 Units Subcutaneous Q4H   insulin aspart  0-5 Units Subcutaneous QHS   insulin aspart  6 Units Subcutaneous TID WC   insulin glargine  10 Units Subcutaneous BID   ipratropium-albuterol  3 mL Nebulization TID   pantoprazole (PROTONIX) IV  40 mg Intravenous QHS   predniSONE  40 mg Oral Q breakfast   senna  1 tablet Oral BID    Infusions:  azithromycin 500 mg  (01/16/21 2300)   cefTRIAXone (ROCEPHIN)  IV 2 g (01/16/21 2123)   vancomycin       Assessment/Plan  1. Acute on Chronic systolic CHF:   -Primarily nonischemic cardiomyopathy.   -Echo in 2017 with EF 15-20%.  Medtronic ICD 2018.  -LHC/RHC 11/06/17 showed volume overload with 80% RCA stenosis.  Cardiac index low at 1.91. The degree of coronary disease does not explain his cardiomyopathy.   -PYP scan in 7/21 not suggestive of transthyretin amyloidosis.  -Admitted 07/23 with acute on chronic CHF in setting of acute on chronic respiratory faliure with hypoxia/COPD exacerbation and suspected sepsis. -BNP 532, 300s last few months. -Echo with EF 20-25%, anterior, anteroseptal, anterolateral and apicall akinesis (not noted on prior study). ? If progression in CAD may be contributing to recurrent CHF exacerbations. Mulitple RF. May consider repeat heart catheterization. -Weight actually down 20 lb from clinic visit in June d/t decreased po intake. Diuresing with IV lasix 60 mg BID. Volume status difficult to assess but does not appear significantly overloaded.  Has been on abx for couple of days and + BC felt to be contaminant. Will place PICC line so that CVP and Co-ox can be monitored. -For now will plan to continue IV lasix one more day and plan to transition to torsemide 80 mg po BID tomorrow (on 80 mg am/60 mg pm at home). -Creatinine improved from 2.37 > 1.87 (baseline 1.5). -GDMT including entresto, eplerenone, hydralazine/imdur, digoxin, bisoprolol and empagliflozin held d/t hypotension and AKI on CKD. Add home meds back as able. BP still soft today. -Not adherent with medications. Followed by paramedicine in the community. Has not kept most recent clinic f/u. Did not return calls to reschedule home visits this past month.  -CR to see  2. Acute on chronic hypoxic respiratory failure: -initially requiring BiPAP, now 4-5L 02 [place]  -Likely d/t combination of COPD exacerbation and acute on chronic  CHF. CCM saw and felt CHF more likely contributing to respiratory failure -Has home 02 but admits he does not use it regularly  3. COPD with possible  acute exacerbation:  -Never smoked, but apparently had significant occupational exposure.  It appears that he won a lawsuit dealing with the occupational exposure-related COPD. CPX in 2/21 showed severe functional limitation, but it appeared to be due to pulmonary abnormalities rather than HF. -Treated for COPD exacerbation per triad  4. Sepsis: -On presentation Lactic acid > 2, WBC 22, tachycardic and tachypneic -procal 12.4>6.72 -BC grew Strep and staph, possibly contaminant. Repeat culture X 2 with no growth X 1 day -UA negative -? If lower extremity wounds source of infection. Recent left heel wound deibridement last week -On antibiotics  5. CAD:   -LHC in 5/19 with 80-90% proximal RCA stenosis treated with DES to RCA.  This did not cause his cardiomyopathy but is a large RCA.  - he denies CP.  HS trop flat -See discussion in #1 -Continue aspirin and statin  6. Polycythemia:  -Likely related to chronic hypoxia.  7. OSA:  -Cannot tolerate CPAP, has 02 to wear at night but not adherent  8. DM:  -A1c pending -He is on insulin.   -Add jardiance back as renal function continues to improve -Alc pending  9. Hypertriglyceridemia:  -Off Vascepa due to blood in stool that resolved. -Continue fenofibrate 145 mg daily.   10. AKI on CKD:  -Has stage IIIa CKD at baseline -Baseline creatinine 1.5 -Up to 2.5, 2.37 > 1.87  SDOH: -Not adherent with follow-ups or home care visits. Enrolled in Paramedicine in the community. -Reports access to medications and transportation. Not taking medicines as prescribed. -TOC consult.  Length of Stay: 2  [facility], Dalbert Garnet, PA-C  01/17/2021, 8:49 AM  Advanced Heart Failure Team Pager [phone] (M-F; 7a - 5p)  Please contact [facility] for night-coverage after hours (4p -7a ) and weekends on  [email or web address]   Patient seen with PA, agree with the above note.   Admitted with possible sepsis syndrome and hypotension, off BP-active meds.  Possible wound infection.  He had Strep and Staph from blood cultures 1/4 but there is some concern for contamination.  He is on ceftriaxone/azithromycin.  PCT 12.4 => 6.72.   Initial AKI, creatinine trending down to 1.87. Baseline around 1.5.   He is being diuresed with IV Lasix.   He is on prednisone for ?component of COPD exacerbation .  General: NAD Neck: Thick JVP difficult, no thyromegaly or thyroid nodule.  Lungs: Rhonchi bilaterally.  CV: Nondisplaced PMI.  Heart regular S1/S2, no S3/S4, no murmur.  No peripheral edema.  No carotid bruit.  Normal pedal pulses.  Abdomen: Soft, nontender, no hepatosplenomegaly, no distention.  Skin: Lower legs wrapped.   Neurologic: Alert and oriented x 3.  Psych: Normal affect. Extremities: No clubbing or cyanosis.  HEENT: Normal.   Patient was admitted with ?COPD exacerbation/CHF exacerbation in the setting of sepsis syndrome probably from leg wounds.  PCT elevated, WBCs elevated.   - Treatment of infection per primary service.  He is afebrile though WBCs remain high.  - Volume status is difficult to discern by exam.  I suspect he is nearing euvolemia though her remains on IV Lasix.  I would suggest PICC placement to follow CVP and co-ox to determine when we can back off on aggressive diuresis.  - Will slowly restart home HF meds as BP and creatinine tolerate.   Marca Ancona 01/17/2021 2:53 PM

## 2021-01-17 NOTE — Progress Notes (Signed)
Pt states he don't want to wear BIPAP tonight. Pt states he has never worn a BIPAP before . Patient is aware to have RN call RT if he changes his mind.

## 2021-01-17 NOTE — Progress Notes (Signed)
Due to  PROGRESS NOTE  Richard [name] MWU:[id number] DOB: May 23, 1961 DOA: 01/15/2021 PCP: Ananias Pilgrim, MD  HPI/Recap of past 24 hours: Richard [name] is a 60 y.o. male with medical history significant of DM2, COPD on chronic O2 2 L, systolic CHF sp AICD, venous stasis ulcer, HTN, CKD, pulmonary nodule (hamartoma) presented with increased SOB, no fever, no CP. Noted to have  Sp02 78% on RA with EMS. 4L [place] applied EMS gave 1 Neb, 125mg  Solumedrol IV and 4mg  Zofran IV. Pt remained on 4 L despite repeated neb treatments. Hx of severe COPD on O2 former smoker. Pt was hospitalized at [facility] in April of this year for CHF exacerbation requiring aggressive diuresis. Chronic leg wounds followed by wound care.  PCCM was consulted for worsening hypoxia and patient being on BiPAP.  Patient admitted for further management.    Today, patient still short of breath, denies it worsening, denies any chest pain, abdominal pain, nausea/vomiting, fever/chills.   Assessment/Plan: Principal Problem:   Acute on chronic respiratory failure with hypoxia and hypercapnia (HCC) Active Problems:   Hypertension   CKD (chronic kidney disease)   ICD (implantable cardioverter-defibrillator) in place   GERD (gastroesophageal reflux disease)   Diabetes mellitus with diabetic neuropathy, with long-term current use of insulin (HCC)   COPD exacerbation (HCC)   Chronic systolic CHF (congestive heart failure) (HCC)   Acute renal failure superimposed on stage 3 chronic kidney disease (HCC)   Elevated troponin   Hyponatremia   Acute on chronic hypoxic respiratory failure Acute on chronic combined HF, AICD-denies any device shocks Patient required BiPAP overnight with sats dropping to 78%, now on 4 L of O2 BNP 532, troponin flat trend EKG nonischemic Chest x-ray with mild vascular congestion Echo showed LVEF 20-25%, RWMA Cardiology on board, continue Lasix Hold home bisoprolol, digoxin, hydralazine, eplerenone,  Entresto, Isordil, plan to restart pending AKI, hypotension Strict I's and O's, daily weights Telemetry  Severe sepsis Likely 2/2 wound infection Vs bacteremia On admission tachycardic, tachypneic, LA >2, leukocytosis Procalcitonin elevated, will trend Chest x-ray as above with congestion, UA unremarkable BC x2 grew 1/4 Streptococcus grp G (awaiting identification), Staphylococcus, repeat pending Continue azithromycin, ceftriaxone, vancomycin Monitor closely  Possible COPD exacerbation On supplemental O2 about 5L Continue azithromycin, prednisone, plan to taper, duo nebs as needed Supplemental oxygen as needed  Diabetes mellitus type 2 CBGs uncontrolled, worsened by steroid use A1c pending SSI, Lantus, Accu-Cheks, hypoglycemic protocol  AKI on CKD stage III Baseline around 1.5 Daily CMP  CAD/HLD S/p PCI Currently chest pain-free Continue aspirin, statin  Hypertension BP soft Management as above  Chronic venous stasis ulcer S/p debridement of left foot wounds on 01/13/2021 WOC consulted Daily wound care     Estimated body mass index is 30.72 kg/m as calculated from the following:   Height as of this encounter: 6\' 5"  (1.956 m).   Weight as of this encounter: 117.5 kg.     Code Status: Full  Family Communication: None at bedside  Disposition Plan: Status is: Inpatient  Remains inpatient appropriate because:Inpatient level of care appropriate due to severity of illness  Dispo: The patient is from: Home              Anticipated d/c is to: Home              Patient currently is not medically stable to d/c.   Difficult to place patient No      Consultants: PCCM Cardiology  Procedures: None  Antimicrobials: Azithromycin  Ceftriaxone Vancomycin  DVT prophylaxis: Heparin [place]   Objective: Vitals:   01/17/21 0030 01/17/21 0325 01/17/21 0402 01/17/21 0616  BP: 94/82 94/82 108/82   Pulse: 72 63 90   Resp: 19 19 14    Temp:      TempSrc:       SpO2: 94%  95%   Weight:    117.5 kg  Height:        Intake/Output Summary (Last 24 hours) at 01/17/2021 1608 Last data filed at 01/17/2021 0915 Gross per 24 hour  Intake 1187 ml  Output 2225 ml  Net -1038 ml   Filed Weights   01/16/21 0558 01/17/21 0616  Weight: 114.4 kg 117.5 kg    Exam: General: NAD Cardiovascular: S1, S2 present Respiratory: Diminished breath sounds bilaterally Abdomen: Soft, nontender, nondistended, bowel sounds present Musculoskeletal: Trace bilateral pedal edema noted, BLE with Ace wrap Skin: Multiple chronic wound on BLE with Ace wrap Psychiatry: Normal mood     Data Reviewed: CBC: Recent Labs  Lab 01/15/21 1545 01/15/21 1716 01/15/21 2045 01/16/21 0323 01/17/21 0538  WBC 22.9*  --   --  24.8* 26.8*  NEUTROABS 18.9*  --   --  22.5* 23.9*  HGB 17.8* 19.4* 19.0* 16.5 17.0  HCT 53.8* 57.0* 56.0* 51.3 53.5*  MCV 87.8  --   --  89.2 88.6  PLT 276  --   --  295 346   Basic Metabolic Panel: Recent Labs  Lab 01/15/21 1545 01/15/21 1716 01/15/21 2045 01/15/21 2307 01/16/21 0323 01/17/21 0538  NA 131* 132* 134*  --  135 135  K 4.8 4.6 4.9  --  5.2* 4.9  CL 90*  --   --   --  93* 97*  CO2 25  --   --   --  27 28  GLUCOSE 362*  --   --   --  454* 248*  BUN 68*  --   --   --  77* 82*  CREATININE 2.26*  --   --   --  2.37* 1.87*  CALCIUM 8.9  --   --   --  8.9 8.7*  MG  --   --   --  2.5* 2.7*  --   PHOS  --   --   --  6.2* 6.9*  --    GFR: Estimated Creatinine Clearance: 61.2 mL/min (A) (by C-G formula based on SCr of 1.87 mg/dL (H)). Liver Function Tests: Recent Labs  Lab 01/15/21 1545 01/16/21 0323 01/17/21 0538  AST 23 18 20   ALT 22 20 22   ALKPHOS 73 78 71  BILITOT 1.7* 1.2 0.7  PROT 8.1 7.6 7.4  ALBUMIN 2.5* 2.1* 2.0*   No results for input(s): LIPASE, AMYLASE in the last 168 hours. No results for input(s): AMMONIA in the last 168 hours. Coagulation Profile: No results for input(s): INR, PROTIME in the last 168  hours. Cardiac Enzymes: Recent Labs  Lab 01/15/21 2307  CKTOTAL 101   BNP (last 3 results) No results for input(s): PROBNP in the last 8760 hours. HbA1C: No results for input(s): HGBA1C in the last 72 hours. CBG: Recent Labs  Lab 01/16/21 1628 01/16/21 2044 01/16/21 2354 01/17/21 0400 01/17/21 1120  GLUCAP 339* 322* 302* 259* 230*   Lipid Profile: No results for input(s): CHOL, HDL, LDLCALC, TRIG, CHOLHDL, LDLDIRECT in the last 72 hours. Thyroid Function Tests: Recent Labs    01/16/21 0323  TSH 0.473   Anemia Panel: No results for input(s): VITAMINB12,  FOLATE, FERRITIN, TIBC, IRON, RETICCTPCT in the last 72 hours. Urine analysis:    Component Value Date/Time   COLORURINE YELLOW 01/16/2021 0700   APPEARANCEUR CLEAR 01/16/2021 0700   LABSPEC 1.015 01/16/2021 0700   PHURINE 5.0 01/16/2021 0700   GLUCOSEU >=500 (A) 01/16/2021 0700   HGBUR NEGATIVE 01/16/2021 0700   BILIRUBINUR NEGATIVE 01/16/2021 0700   KETONESUR NEGATIVE 01/16/2021 0700   PROTEINUR NEGATIVE 01/16/2021 0700   NITRITE NEGATIVE 01/16/2021 0700   LEUKOCYTESUR NEGATIVE 01/16/2021 0700   Sepsis Labs: @LABRCNTIP (procalcitonin:4,lacticidven:4)  ) Recent Results (from the past 240 hour(s))  Culture, blood (routine x 2)     Status: None (Preliminary result)   Collection Time: 01/15/21  3:41 PM   Specimen: BLOOD RIGHT HAND  Result Value Ref Range Status   Specimen Description BLOOD RIGHT HAND  Final   Special Requests   Final    BOTTLES DRAWN AEROBIC AND ANAEROBIC Blood Culture adequate volume   Culture   Final    NO GROWTH 2 DAYS Performed at [facility], [address] [place], [place] [id number]    Report Status PENDING  Incomplete  Resp Panel by RT-PCR (Flu A&B, Covid) Nasopharyngeal Swab     Status: None   Collection Time: 01/15/21  3:45 PM   Specimen: Nasopharyngeal Swab; Nasopharyngeal(NP) swabs in vial transport medium  Result Value Ref Range Status   SARS Coronavirus 2 by RT PCR  NEGATIVE NEGATIVE Final    Comment: (NOTE) SARS-CoV-2 target nucleic acids are NOT DETECTED.  The SARS-CoV-2 RNA is generally detectable in upper respiratory specimens during the acute phase of infection. The lowest concentration of SARS-CoV-2 viral copies this assay can detect is 138 copies/mL. A negative result does not preclude SARS-Cov-2 infection and should not be used as the sole basis for treatment or other patient management decisions. A negative result may occur with  improper specimen collection/handling, submission of specimen other than nasopharyngeal swab, presence of viral mutation(s) within the areas targeted by this assay, and inadequate number of viral copies(<138 copies/mL). A negative result must be combined with clinical observations, patient history, and epidemiological information. The expected result is Negative.  Fact Sheet for Patients:  BloggerCourse.comhttps://www.fda.gov/media/152166/download  Fact Sheet for Healthcare Providers:  SeriousBroker.ithttps://www.fda.gov/media/152162/download  This test is no t yet approved or cleared by the [place] FDA and  has been authorized for detection and/or diagnosis of SARS-CoV-2 by FDA under an Emergency Use Authorization (EUA). This EUA will remain  in effect (meaning this test can be used) for the duration of the COVID-19 declaration under Section 564(b)(1) of the Act, 21 U.S.C.section 360bbb-3(b)(1), unless the authorization is terminated  or revoked sooner.       Influenza A by PCR NEGATIVE NEGATIVE Final   Influenza B by PCR NEGATIVE NEGATIVE Final    Comment: (NOTE) The Xpert Xpress SARS-CoV-2/FLU/RSV plus assay is intended as an aid in the diagnosis of influenza from Nasopharyngeal swab specimens and should not be used as a sole basis for treatment. Nasal washings and aspirates are unacceptable for Xpert Xpress SARS-CoV-2/FLU/RSV testing.  Fact Sheet for Patients: BloggerCourse.comhttps://www.fda.gov/media/152166/download  Fact Sheet for  Healthcare Providers: SeriousBroker.ithttps://www.fda.gov/media/152162/download  This test is not yet approved or cleared by the [place] FDA and has been authorized for detection and/or diagnosis of SARS-CoV-2 by FDA under an Emergency Use Authorization (EUA). This EUA will remain in effect (meaning this test can be used) for the duration of the COVID-19 declaration under Section 564(b)(1) of the Act, 21 U.S.C. section 360bbb-3(b)(1),  unless the authorization is terminated or revoked.  Performed at [facility], [address], [place], [place] [address]   Culture, blood (routine x 2)     Status: Abnormal (Preliminary result)   Collection Time: 01/15/21  6:05 PM   Specimen: BLOOD LEFT FOREARM  Result Value Ref Range Status   Specimen Description BLOOD LEFT FOREARM  Final   Special Requests   Final    BOTTLES DRAWN AEROBIC AND ANAEROBIC Blood Culture adequate volume   Culture  Setup Time   Final    GRAM POSITIVE COCCI IN CHAINS AEROBIC BOTTLE ONLY CRITICAL RESULT CALLED TO, READ BACK BY AND VERIFIED WITH: PHARM D K.HAMMONS ON [id number] AT 0935 BY E.PARRISH    Culture (A)  Final    STREPTOCOCCUS GROUP G IDENTIFICATION TO FOLLOW Performed at [facility], [address] [place], [place] [address]    Report Status PENDING  Incomplete  Blood Culture ID Panel (Reflexed)     Status: Abnormal   Collection Time: 01/15/21  6:05 PM  Result Value Ref Range Status   Enterococcus faecalis NOT DETECTED NOT DETECTED Final   Enterococcus Faecium NOT DETECTED NOT DETECTED Final   Listeria monocytogenes NOT DETECTED NOT DETECTED Final   Staphylococcus species DETECTED (A) NOT DETECTED Final    Comment: CRITICAL RESULT CALLED TO, READ BACK BY AND VERIFIED WITH: PHARM D K.HAMMONS ON [id number] AT 0935 BY E.PARRISH    Staphylococcus aureus (BCID) NOT DETECTED NOT DETECTED Final   Staphylococcus epidermidis NOT DETECTED NOT DETECTED Final   Staphylococcus lugdunensis NOT DETECTED NOT DETECTED  Final   Streptococcus species DETECTED (A) NOT DETECTED Final    Comment: Not Enterococcus species, Streptococcus agalactiae, Streptococcus pyogenes, or Streptococcus pneumoniae. CRITICAL RESULT CALLED TO, READ BACK BY AND VERIFIED WITH: PHARM D K.HAMMONS ON [id number] AT 0935 BY E.PARRISH    Streptococcus agalactiae NOT DETECTED NOT DETECTED Final   Streptococcus pneumoniae NOT DETECTED NOT DETECTED Final   Streptococcus pyogenes NOT DETECTED NOT DETECTED Final   A.calcoaceticus-baumannii NOT DETECTED NOT DETECTED Final   Bacteroides fragilis NOT DETECTED NOT DETECTED Final   Enterobacterales NOT DETECTED NOT DETECTED Final   Enterobacter cloacae complex NOT DETECTED NOT DETECTED Final   Escherichia coli NOT DETECTED NOT DETECTED Final   Klebsiella aerogenes NOT DETECTED NOT DETECTED Final   Klebsiella oxytoca NOT DETECTED NOT DETECTED Final   Klebsiella pneumoniae NOT DETECTED NOT DETECTED Final   Proteus species NOT DETECTED NOT DETECTED Final   Salmonella species NOT DETECTED NOT DETECTED Final   Serratia marcescens NOT DETECTED NOT DETECTED Final   Haemophilus influenzae NOT DETECTED NOT DETECTED Final   Neisseria meningitidis NOT DETECTED NOT DETECTED Final   Pseudomonas aeruginosa NOT DETECTED NOT DETECTED Final   Stenotrophomonas maltophilia NOT DETECTED NOT DETECTED Final   Candida albicans NOT DETECTED NOT DETECTED Final   Candida auris NOT DETECTED NOT DETECTED Final   Candida glabrata NOT DETECTED NOT DETECTED Final   Candida krusei NOT DETECTED NOT DETECTED Final   Candida parapsilosis NOT DETECTED NOT DETECTED Final   Candida tropicalis NOT DETECTED NOT DETECTED Final   Cryptococcus neoformans/gattii NOT DETECTED NOT DETECTED Final    Comment: Performed at [facility], [address] [place], [place] [address]  MRSA Next Gen by PCR, Nasal     Status: Abnormal   Collection Time: 01/15/21 10:38 PM   Specimen: Nasal Mucosa; Nasal Swab  Result Value Ref Range Status    MRSA by PCR Next Gen  DETECTED (A) NOT DETECTED Final    Comment: RESULT CALLED TO, READ BACK BY AND VERIFIED WITH: RN M.SHULL ON [id number] AT 1121 BY E.PARRISH (NOTE) The GeneXpert MRSA Assay (FDA approved for NASAL specimens only), is one component of a comprehensive MRSA colonization surveillance program. It is not intended to diagnose MRSA infection nor to guide or monitor treatment for MRSA infections. Test performance is not FDA approved in patients less than 63 years old. Performed at [facility], [address] [place], [place] [address]   Culture, blood (routine x 2)     Status: None (Preliminary result)   Collection Time: 01/16/21 11:38 AM   Specimen: BLOOD  Result Value Ref Range Status   Specimen Description BLOOD RIGHT ANTECUBITAL  Final   Special Requests   Final    BOTTLES DRAWN AEROBIC ONLY Blood Culture adequate volume   Culture   Final    NO GROWTH < 24 HOURS Performed at [facility], [address] [place], [place] [address]    Report Status PENDING  Incomplete  Culture, blood (routine x 2)     Status: None (Preliminary result)   Collection Time: 01/16/21 11:39 AM   Specimen: BLOOD RIGHT HAND  Result Value Ref Range Status   Specimen Description BLOOD RIGHT HAND  Final   Special Requests   Final    BOTTLES DRAWN AEROBIC ONLY Blood Culture results may not be optimal due to an inadequate volume of blood received in culture bottles   Culture   Final    NO GROWTH < 24 HOURS Performed at [facility], [address], [place], [place] [address]    Report Status PENDING  Incomplete      Studies: ECHOCARDIOGRAM COMPLETE  Result Date: 01/16/2021    ECHOCARDIOGRAM REPORT   Patient Name:   [facility] Date of Exam: 01/16/2021 Medical Rec #:  [id number]    Height:       77.0 in Accession #:    [id number]   Weight:       252.2 lb Date of Birth:  1960/12/20   BSA:          2.469 m Patient Age:    59 years     BP:           95/60 mmHg Patient  Gender: M            HR:           78 bpm. Exam Location:  Inpatient Procedure: 2D Echo, Cardiac Doppler and Color Doppler Indications:    chf  History:        Patient has prior history of Echocardiogram examinations, most                 recent 07/08/2020. CHF, Previous Myocardial Infarction,                 Defibrillator, COPD, Arrythmias:LBBB; Risk Factors:Hypertension,                 Former Smoker and Diabetes.  Sonographer:    Mikki Harbor Referring Phys: [id number] ANASTASSIA DOUTOVA IMPRESSIONS  1. The anterior,anteroseptal,anterolateral walls are akinetic. The apex is akinetic. Marland Kitchen Left ventricular ejection fraction, by estimation, is 20 to 25%. The left ventricle has severely decreased function. The left ventricle demonstrates regional wall motion abnormalities (see scoring diagram/findings for description). The left ventricular internal cavity size was moderately to severely dilated. Left ventricular diastolic parameters are indeterminate.  2. RV poorly  visualized. Grossly appears mildly enlarged with low normal to mildly decreased function. Right ventricular systolic function was not well visualized. The right ventricular size is not well visualized. Tricuspid regurgitation signal is inadequate for assessing PA pressure.  3. Left atrial size was mildly dilated.  4. The mitral valve is normal in structure. No evidence of mitral valve regurgitation. No evidence of mitral stenosis.  5. The aortic valve was not well visualized. Aortic valve regurgitation is not visualized. No aortic stenosis is present. FINDINGS  Left Ventricle: The anterior,anteroseptal,anterolateral walls are akinetic. The apex is akinetic. Left ventricular ejection fraction, by estimation, is 20 to 25%. The left ventricle has severely decreased function. The left ventricle demonstrates regional wall motion abnormalities. Definity contrast agent was given IV to delineate the left ventricular endocardial borders. The left ventricular internal  cavity size was moderately to severely dilated. There is no left ventricular hypertrophy. Left ventricular diastolic parameters are indeterminate. Right Ventricle: RV poorly visualized. Grossly appears mildly enlarged with low normal to mildly decreased function. The right ventricular size is not well visualized. Right vetricular wall thickness was not well visualized. Right ventricular systolic function was not well visualized. Tricuspid regurgitation signal is inadequate for assessing PA pressure. Left Atrium: Left atrial size was mildly dilated. Right Atrium: Right atrial size was normal in size. Pericardium: There is no evidence of pericardial effusion. Mitral Valve: The mitral valve is normal in structure. No evidence of mitral valve regurgitation. No evidence of mitral valve stenosis. MV peak gradient, 1.4 mmHg. The mean mitral valve gradient is 1.0 mmHg. Tricuspid Valve: The tricuspid valve is not well visualized. Tricuspid valve regurgitation is mild . No evidence of tricuspid stenosis. Aortic Valve: The aortic valve was not well visualized. Aortic valve regurgitation is not visualized. No aortic stenosis is present. Aortic valve mean gradient measures 3.0 mmHg. Aortic valve peak gradient measures 5.7 mmHg. Aortic valve area, by VTI measures 1.85 cm. Pulmonic Valve: The pulmonic valve was not well visualized. Pulmonic valve regurgitation is not visualized. No evidence of pulmonic stenosis. Aorta: The aortic root is normal in size and structure. IAS/Shunts: No atrial level shunt detected by color flow Doppler. Additional Comments: A device lead is visualized.  LEFT VENTRICLE PLAX 2D LVIDd:         6.80 cm  Diastology LVIDs:         6.00 cm  LV e' medial:   5.18 cm/s LV PW:         1.10 cm  LV E/e' medial: 10.3 LV IVS:        1.10 cm LVOT diam:     2.00 cm LV SV:         42 LV SV Index:   17 LVOT Area:     3.14 cm  RIGHT VENTRICLE RV Basal diam:  4.95 cm RV Mid diam:    6.10 cm RV S prime:     8.85 cm/s  TAPSE (M-mode): 2.4 cm LEFT ATRIUM             Index       RIGHT ATRIUM           Index LA diam:        4.00 cm 1.62 cm/m  RA Area:     22.50 cm LA Vol (A2C):   75.9 ml 30.75 ml/m RA Volume:   63.00 ml  25.52 ml/m LA Vol (A4C):   70.3 ml 28.48 ml/m LA Biplane Vol: 73.5 ml 29.77 ml/m  AORTIC VALVE AV  Area (Vmax):    1.94 cm AV Area (Vmean):   1.77 cm AV Area (VTI):     1.85 cm AV Vmax:           119.00 cm/s AV Vmean:          84.800 cm/s AV VTI:            0.226 m AV Peak Grad:      5.7 mmHg AV Mean Grad:      3.0 mmHg LVOT Vmax:         73.30 cm/s LVOT Vmean:        47.900 cm/s LVOT VTI:          0.133 m LVOT/AV VTI ratio: 0.59  AORTA Ao Root diam: 3.60 cm Ao Asc diam:  3.20 cm MITRAL VALVE MV Area (PHT): 4.15 cm    SHUNTS MV Area VTI:   2.29 cm    Systemic VTI:  0.13 m MV Peak grad:  1.4 mmHg    Systemic Diam: 2.00 cm MV Mean grad:  1.0 mmHg MV Vmax:       0.59 m/s MV Vmean:      36.9 cm/s MV Decel Time: 183 msec MV E velocity: 53.30 cm/s MV A velocity: 41.80 cm/s MV E/A ratio:  1.28 Dina Rich MD Electronically signed by Dina Rich MD Signature Date/Time: 01/16/2021/4:43:15 PM    Final    [place] EKG SITE RITE  Result Date: 01/17/2021 If Site Rite image not attached, placement could not be confirmed due to current cardiac rhythm.   Scheduled Meds:  allopurinol  100 mg Oral Daily   aspirin EC  81 mg Oral Daily   atorvastatin  40 mg Oral Daily   docusate sodium  100 mg Oral BID   [START ON 01/18/2021] feeding supplement (GLUCERNA SHAKE)  237 mL Oral BID BM   furosemide  60 mg Intravenous Q12H   heparin  5,000 Units Subcutaneous Q8H   insulin aspart  0-20 Units Subcutaneous Q4H   insulin aspart  0-5 Units Subcutaneous QHS   insulin aspart  6 Units Subcutaneous TID WC   insulin glargine  10 Units Subcutaneous BID   ipratropium-albuterol  3 mL Nebulization BID   multivitamin with minerals  1 tablet Oral Daily   pantoprazole  40 mg Oral QHS   predniSONE  40 mg Oral Q breakfast    Ensure Max Protein  11 oz Oral QHS   senna  1 tablet Oral BID    Continuous Infusions:  azithromycin 500 mg (01/16/21 2300)   cefTRIAXone (ROCEPHIN)  IV 2 g (01/16/21 2123)   vancomycin 1,500 mg (01/17/21 1223)     LOS: 2 days     Briant Cedar, MD [facility]  If 7PM-7AM, please contact night-coverage [email or web address] 01/17/2021, 4:08 PM

## 2021-01-17 NOTE — Progress Notes (Signed)
Initial Nutrition Assessment  DOCUMENTATION CODES:   Obesity unspecified  INTERVENTION:   -Glucerna Shake po BID, each supplement provides 220 kcal and 10 grams of protein  -Ensure Max po daily, each supplement provides 150 kcal and 30 grams of protein -MVI with minerals daily  NUTRITION DIAGNOSIS:   Increased nutrient needs related to wound healing as evidenced by estimated needs.  GOAL:   Patient will meet greater than or equal to 90% of their needs  MONITOR:   PO intake, Supplement acceptance, Labs, Weight trends, Skin, I & O's  REASON FOR ASSESSMENT:   Consult Assessment of nutrition requirement/status  ASSESSMENT:   Richard [name] is a 60 y.o. male with medical history significant of DM2, COPD on chronic O2 2 L, systolic CHF sp AICD, venous stasis ulcer, HTN, CKD, pulmonary nodule (hamartoma) presented with increased SOB, no fever, no CP. Noted to have  Sp02 78% on RA with EMS. 4L [place] applied EMS gave 1 Neb, 125mg  Solumedrol IV and 4mg  Zofran IV. Pt remained on 4 L despite repeated neb treatments. Hx of severe COPD on O2 former smoker. Pt was hospitalized at [facility] in April of this year for CHF exacerbation requiring aggressive diuresis. Chronic leg wounds followed by wound care.  PCCM was consulted for worsening hypoxia and patient being on BiPAP.  Patient admitted for further management.  Pt admitted with COPD and CHF exacerbation.   Reviewed I/O's: -1.1 L x 24 hours and -780 ml since admission  UOP: 3.2 Lx 24 hours  Per [facility]OCN notes, pt with chronic LE wounds, full thickness wounds on the LEs and two areas on the left heel. Pt reports he is followed by the wound care center and has a regimen for these areas, which he is compliant with. He does not take any vitamins or supplements PTA.   Spoke with pt at bedside, who reports feeling better today. He shares that his appetite has been picking back up- observed breakfast tray- pt consumed about 50% of tray. Pt complains  of early satiety and hasn't felt like eating. He shares that he did not eat for 5 days PTA due to feeling unwell. Noted meal completion 80-85%. Pt reports he usually eats 2-3 meals per day, but very difficult to obtain diet recall, as pt very tangential at time of visit. Pt is missing his top teeth, has dentures, but does not wear them. He denies difficulty with regular food textures.   Reviewed wt hx; pt has experienced a 2.7% wt loss over the past 3 months, which is not significant for time frame. Pt report his UBW is around 280# and reports a 40# wt loss over the past month; unable to verify this statement with documented wt hx.   Discussed importance of good meal and supplement intake to promote healing. Pt amenable to nutritional supplements.   Medications reviewed and include colace, lasix, prednsione, and senna.   Lab Results  Component Value Date   HGBA1C 10.4 (H) 10/31/2017   PTA DM medications are . Humulin R U500 as directed per sliding scale, Jardiance 25 mg daily. Pt reports good glycemic control at home; he received reading between 98-130 3 times per day on his 04-16-1986. He denies any difficulty obtaining his medications. He reports improved control since starting on U-500; he is followed by outpatient endocrinology.   Labs reviewed: CBGS: 259-322 (inpatient orders for glycemic control are 0-20 units insulin aspart every 4 hours, 0-5 units insulin aspart daily at bedtime, 6 units insulin  aspart TID with meals, and 10 units insulin glargine BID).    NUTRITION - FOCUSED PHYSICAL EXAM:  Flowsheet Row Most Recent Value  Orbital Region No depletion  Upper Arm Region No depletion  Thoracic and Lumbar Region No depletion  Buccal Region No depletion  Temple Region No depletion  Clavicle Bone Region No depletion  Clavicle and Acromion Bone Region No depletion  Scapular Bone Region No depletion  Dorsal Hand No depletion  Patellar Region No depletion  Anterior Thigh Region No  depletion  Posterior Calf Region No depletion  Edema (RD Assessment) Mild  Hair Reviewed  Eyes Reviewed  Mouth Reviewed  Skin Reviewed  Nails Reviewed       Diet Order:   Diet Order             Diet Carb Modified Fluid consistency: Thin; Room service appropriate? Yes; Fluid restriction: 2000 mL Fluid  Diet effective now                   EDUCATION NEEDS:   Education needs have been addressed  Skin:  Skin Assessment: Skin Integrity Issues: Skin Integrity Issues:: Other (Comment) Other: full thickness wounds on LE and two areas on the left heel  Last BM:  Unknown  Height:   Ht Readings from Last 1 Encounters:  01/16/21 6\' 5"  (1.956 m)    Weight:   Wt Readings from Last 1 Encounters:  01/17/21 117.5 kg    Ideal Body Weight:  94.5 kg  BMI:  Body mass index is 30.72 kg/m.  Estimated Nutritional Needs:   Kcal:  01/19/21  Protein:  140-155 grams  Fluid:  > 2 L    [id number], RD, LDN, CDCES Registered Dietitian II Certified Diabetes Care and Education Specialist Please refer to [facility] for RD and/or RD on-call/weekend/after hours pager

## 2021-01-18 ENCOUNTER — Inpatient Hospital Stay (HOSPITAL_COMMUNITY): Payer: Medicare Other

## 2021-01-18 ENCOUNTER — Ambulatory Visit: Payer: Medicare Other | Admitting: Medical

## 2021-01-18 DIAGNOSIS — L97329 Non-pressure chronic ulcer of left ankle with unspecified severity: Secondary | ICD-10-CM | POA: Diagnosis not present

## 2021-01-18 DIAGNOSIS — N179 Acute kidney failure, unspecified: Secondary | ICD-10-CM | POA: Diagnosis not present

## 2021-01-18 DIAGNOSIS — J9622 Acute and chronic respiratory failure with hypercapnia: Secondary | ICD-10-CM | POA: Diagnosis not present

## 2021-01-18 DIAGNOSIS — E1140 Type 2 diabetes mellitus with diabetic neuropathy, unspecified: Secondary | ICD-10-CM | POA: Diagnosis not present

## 2021-01-18 DIAGNOSIS — J9621 Acute and chronic respiratory failure with hypoxia: Secondary | ICD-10-CM | POA: Diagnosis not present

## 2021-01-18 DIAGNOSIS — Z95810 Presence of automatic (implantable) cardiac defibrillator: Secondary | ICD-10-CM | POA: Diagnosis not present

## 2021-01-18 DIAGNOSIS — A419 Sepsis, unspecified organism: Secondary | ICD-10-CM

## 2021-01-18 DIAGNOSIS — R6520 Severe sepsis without septic shock: Secondary | ICD-10-CM

## 2021-01-18 DIAGNOSIS — R7881 Bacteremia: Secondary | ICD-10-CM

## 2021-01-18 DIAGNOSIS — J441 Chronic obstructive pulmonary disease with (acute) exacerbation: Secondary | ICD-10-CM | POA: Diagnosis not present

## 2021-01-18 DIAGNOSIS — I5022 Chronic systolic (congestive) heart failure: Secondary | ICD-10-CM | POA: Diagnosis not present

## 2021-01-18 LAB — CBC WITH DIFFERENTIAL/PLATELET
Abs Immature Granulocytes: 0.3 10*3/uL — ABNORMAL HIGH (ref 0.00–0.07)
Basophils Absolute: 0.1 10*3/uL (ref 0.0–0.1)
Basophils Relative: 0 %
Eosinophils Absolute: 0.1 10*3/uL (ref 0.0–0.5)
Eosinophils Relative: 1 %
HCT: 51.8 % (ref 39.0–52.0)
Hemoglobin: 17.1 g/dL — ABNORMAL HIGH (ref 13.0–17.0)
Immature Granulocytes: 1 %
Lymphocytes Relative: 5 %
Lymphs Abs: 1.1 10*3/uL (ref 0.7–4.0)
MCH: 29.3 pg (ref 26.0–34.0)
MCHC: 33 g/dL (ref 30.0–36.0)
MCV: 88.7 fL (ref 80.0–100.0)
Monocytes Absolute: 1.9 10*3/uL — ABNORMAL HIGH (ref 0.1–1.0)
Monocytes Relative: 8 %
Neutro Abs: 19.6 10*3/uL — ABNORMAL HIGH (ref 1.7–7.7)
Neutrophils Relative %: 85 %
Platelets: 348 10*3/uL (ref 150–400)
RBC: 5.84 MIL/uL — ABNORMAL HIGH (ref 4.22–5.81)
RDW: 19.9 % — ABNORMAL HIGH (ref 11.5–15.5)
WBC: 23.1 10*3/uL — ABNORMAL HIGH (ref 4.0–10.5)
nRBC: 0 % (ref 0.0–0.2)

## 2021-01-18 LAB — COMPREHENSIVE METABOLIC PANEL
ALT: 19 U/L (ref 0–44)
AST: 18 U/L (ref 15–41)
Albumin: 2.2 g/dL — ABNORMAL LOW (ref 3.5–5.0)
Alkaline Phosphatase: 63 U/L (ref 38–126)
Anion gap: 11 (ref 5–15)
BUN: 75 mg/dL — ABNORMAL HIGH (ref 6–20)
CO2: 29 mmol/L (ref 22–32)
Calcium: 8.7 mg/dL — ABNORMAL LOW (ref 8.9–10.3)
Chloride: 95 mmol/L — ABNORMAL LOW (ref 98–111)
Creatinine, Ser: 1.64 mg/dL — ABNORMAL HIGH (ref 0.61–1.24)
GFR, Estimated: 48 mL/min — ABNORMAL LOW (ref >60)
Glucose, Bld: 178 mg/dL — ABNORMAL HIGH (ref 70–99)
Potassium: 4.5 mmol/L (ref 3.5–5.1)
Sodium: 135 mmol/L (ref 135–145)
Total Bilirubin: 0.9 mg/dL (ref 0.3–1.2)
Total Protein: 7.9 g/dL (ref 6.5–8.1)

## 2021-01-18 LAB — COOXEMETRY PANEL
Carboxyhemoglobin: 0.8 % (ref 0.5–1.5)
Methemoglobin: 1.4 % (ref 0.0–1.5)
O2 Saturation: 60.1 %
Total hemoglobin: 17.8 g/dL — ABNORMAL HIGH (ref 12.0–16.0)

## 2021-01-18 LAB — GLUCOSE, CAPILLARY
Glucose-Capillary: 156 mg/dL — ABNORMAL HIGH (ref 70–99)
Glucose-Capillary: 201 mg/dL — ABNORMAL HIGH (ref 70–99)
Glucose-Capillary: 184 mg/dL — ABNORMAL HIGH (ref 70–99)
Glucose-Capillary: 194 mg/dL — ABNORMAL HIGH (ref 70–99)
Glucose-Capillary: 261 mg/dL — ABNORMAL HIGH (ref 70–99)

## 2021-01-18 LAB — PROCALCITONIN: Procalcitonin: 4.37 ng/mL

## 2021-01-18 MED ORDER — INSULIN ASPART 100 UNIT/ML IJ SOLN
4.0000 [IU] | Freq: Three times a day (TID) | INTRAMUSCULAR | Status: DC
  Administered 2021-01-18 – 2021-01-20 (×8): 4 [IU] via SUBCUTANEOUS

## 2021-01-18 MED ORDER — TORSEMIDE 20 MG PO TABS
80.0000 mg | ORAL_TABLET | Freq: Two times a day (BID) | ORAL | Status: DC
  Administered 2021-01-19 (×2): 80 mg via ORAL
  Filled 2021-01-18 (×2): qty 4

## 2021-01-18 MED ORDER — CHLORHEXIDINE GLUCONATE CLOTH 2 % EX PADS: 6.0000 | MEDICATED_PAD | Freq: Every day | CUTANEOUS | Status: DC

## 2021-01-18 MED ORDER — PENICILLIN G POTASSIUM 20000000 UNITS IJ SOLR
12.0000 10*6.[IU] | Freq: Two times a day (BID) | INTRAVENOUS | Status: DC
  Administered 2021-01-18 – 2021-01-20 (×5): 12 10*6.[IU] via INTRAVENOUS
  Filled 2021-01-18 (×9): qty 12

## 2021-01-18 MED ORDER — HYDRALAZINE HCL 10 MG PO TABS
10.0000 mg | ORAL_TABLET | Freq: Three times a day (TID) | ORAL | Status: DC
  Administered 2021-01-18 – 2021-01-26 (×22): 10 mg via ORAL
  Filled 2021-01-18 (×24): qty 1

## 2021-01-18 MED ORDER — BACLOFEN 5 MG HALF TABLET
5.0000 mg | ORAL_TABLET | Freq: Once | ORAL | Status: AC
Start: 2021-01-18 — End: 2021-01-18
  Administered 2021-01-18: 5 mg via ORAL
  Filled 2021-01-18: qty 1

## 2021-01-18 MED ORDER — TORSEMIDE 20 MG PO TABS: 80.0000 mg | ORAL_TABLET | Freq: Two times a day (BID) | ORAL | Status: DC

## 2021-01-18 MED ORDER — MUPIROCIN 2 % EX OINT
1.0000 | TOPICAL_OINTMENT | Freq: Two times a day (BID) | CUTANEOUS | Status: AC
Start: 2021-01-18 — End: 2021-01-23
  Administered 2021-01-18 – 2021-01-22 (×9): 1 via NASAL
  Filled 2021-01-18 (×3): qty 22

## 2021-01-18 MED ORDER — ISOSORBIDE MONONITRATE ER 30 MG PO TB24
30.0000 mg | ORAL_TABLET | Freq: Every day | ORAL | Status: DC
  Administered 2021-01-18 – 2021-01-27 (×10): 30 mg via ORAL
  Filled 2021-01-18 (×10): qty 1

## 2021-01-18 MED ORDER — INSULIN ASPART 100 UNIT/ML IJ SOLN
0.0000 [IU] | Freq: Three times a day (TID) | INTRAMUSCULAR | Status: DC
  Administered 2021-01-18 (×2): 3 [IU] via SUBCUTANEOUS
  Administered 2021-01-19: 8 [IU] via SUBCUTANEOUS
  Administered 2021-01-19 (×2): 2 [IU] via SUBCUTANEOUS
  Administered 2021-01-20: 3 [IU] via SUBCUTANEOUS
  Administered 2021-01-20: 8 [IU] via SUBCUTANEOUS
  Administered 2021-01-20: 3 [IU] via SUBCUTANEOUS
  Administered 2021-01-21: 5 [IU] via SUBCUTANEOUS
  Administered 2021-01-21 – 2021-01-22 (×2): 3 [IU] via SUBCUTANEOUS
  Administered 2021-01-22: 5 [IU] via SUBCUTANEOUS
  Administered 2021-01-22 – 2021-01-23 (×2): 2 [IU] via SUBCUTANEOUS
  Administered 2021-01-24 – 2021-01-25 (×3): 3 [IU] via SUBCUTANEOUS
  Administered 2021-01-26 – 2021-01-27 (×4): 2 [IU] via SUBCUTANEOUS
  Administered 2021-01-27: 3 [IU] via SUBCUTANEOUS

## 2021-01-18 MED ORDER — VANCOMYCIN HCL 1750 MG/350ML IV SOLN
1750.0000 mg | INTRAVENOUS | Status: DC
  Filled 2021-01-18: qty 350

## 2021-01-18 NOTE — Progress Notes (Addendum)
Advanced Heart Failure Rounding Note  PCP-Cardiologist: Dr. Shirlee Latch  Subjective:   PICC line placed yesterday afternoon. Co-ox last night 63%. AM Co-ox pending.  Creatinine continuing to trend down, 2.37 > 1.87 > 1.64  CVP 6. Remains on 60 mg lasix IV BID. Weight stable. No significant dyspnea or chest pain. Still requiring 5L02   Objective:   Weight Range: 117.5 kg Body mass index is 30.72 kg/m.   Vital Signs:   Temp:  [97.4 F (36.3 C)-98.3 F (36.8 C)] 97.4 F (36.3 C) (07/26 0741) Pulse Rate:  [76-96] 93 (07/26 0741) Resp:  [16-20] 20 (07/26 0741) BP: (95-116)/(73-92) 108/75 (07/26 0741) SpO2:  [94 %-100 %] 97 % (07/26 0548) Weight:  [117.5 kg] 117.5 kg (07/26 0548) Last BM Date:  (pta)  Weight change: Filed Weights   01/16/21 0558 01/17/21 0616 01/18/21 0548  Weight: 114.4 kg 117.5 kg 117.5 kg    Intake/Output:   Intake/Output Summary (Last 24 hours) at 01/18/2021 0744 Last data filed at 01/18/2021 0429 Gross per 24 hour  Intake 240 ml  Output 1000 ml  Net -760 ml      Physical Exam  CVP 6 General:  Well appearing. No resp difficulty HEENT: Normal Neck: Supple. No JVD. Carotids 2+ bilat; no bruits. No lymphadenopathy or thyromegaly appreciated. Cor: PMI nondisplaced. Regular rate & rhythm. No rubs, gallops or murmurs. Lungs: CTA bilaterally Abdomen: Soft, nontender, nondistended. No hepatosplenomegaly. No bruits or masses. Good bowel sounds. Extremities: No cyanosis, clubbing, rash, edema Neuro: Alert & orientedx3, cranial nerves grossly intact. moves all 4 extremities w/o difficulty. Affect pleasant   Telemetry   Sinus, 80s-90s (personally reviewed)  Labs    CBC Recent Labs    01/17/21 0538 01/18/21 0325  WBC 26.8* 23.1*  NEUTROABS 23.9* 19.6*  HGB 17.0 17.1*  HCT 53.5* 51.8  MCV 88.6 88.7  PLT 346 348   Basic Metabolic Panel Recent Labs    37/10/62 2307 01/16/21 0323 01/17/21 0538 01/18/21 0325  NA  --  135 135 135  K  --   5.2* 4.9 4.5  CL  --  93* 97* 95*  CO2  --  27 28 29   GLUCOSE  --  454* 248* 178*  BUN  --  77* 82* 75*  CREATININE  --  2.37* 1.87* 1.64*  CALCIUM  --  8.9 8.7* 8.7*  MG 2.5* 2.7*  --   --   PHOS 6.2* 6.9*  --   --    Liver Function Tests Recent Labs    01/17/21 0538 01/18/21 0325  AST 20 18  ALT 22 19  ALKPHOS 71 63  BILITOT 0.7 0.9  PROT 7.4 7.9  ALBUMIN 2.0* 2.2*   No results for input(s): LIPASE, AMYLASE in the last 72 hours. Cardiac Enzymes Recent Labs    01/15/21 2307  CKTOTAL 101    BNP: BNP (last 3 results) Recent Labs    10/16/20 1810 11/09/20 1519 01/15/21 1545  BNP 329.7* 373.4* 532.8*    ProBNP (last 3 results) No results for input(s): PROBNP in the last 8760 hours.   D-Dimer No results for input(s): DDIMER in the last 72 hours. Hemoglobin A1C Recent Labs    01/17/21 0538  HGBA1C 10.3*   Fasting Lipid Panel No results for input(s): CHOL, HDL, LDLCALC, TRIG, CHOLHDL, LDLDIRECT in the last 72 hours. Thyroid Function Tests Recent Labs    01/16/21 0323  TSH 0.473    Other results:   Imaging    01/18/21  EKG SITE RITE  Result Date: 01/17/2021 If Site Rite image not attached, placement could not be confirmed due to current cardiac rhythm.    Medications:     Scheduled Medications:  allopurinol  100 mg Oral Daily   aspirin EC  81 mg Oral Daily   atorvastatin  40 mg Oral Daily   Chlorhexidine Gluconate Cloth  6 each Topical Daily   docusate sodium  100 mg Oral BID   feeding supplement (GLUCERNA SHAKE)  237 mL Oral BID BM   furosemide  60 mg Intravenous Q12H   heparin  5,000 Units Subcutaneous Q8H   insulin aspart  0-20 Units Subcutaneous Q4H   insulin aspart  0-5 Units Subcutaneous QHS   insulin aspart  6 Units Subcutaneous TID WC   insulin glargine  10 Units Subcutaneous BID   ipratropium-albuterol  3 mL Nebulization BID   multivitamin with minerals  1 tablet Oral Daily   pantoprazole  40 mg Oral QHS   predniSONE  40 mg Oral Q  breakfast   Ensure Max Protein  11 oz Oral QHS   senna  1 tablet Oral BID   sodium chloride flush  10-40 mL Intracatheter Q12H    Infusions:  azithromycin 500 mg (01/17/21 2308)   cefTRIAXone (ROCEPHIN)  IV Stopped (01/18/21 0640)   vancomycin 1,500 mg (01/17/21 1223)    PRN Medications: acetaminophen **OR** acetaminophen, albuterol, sodium chloride flush   Assessment/Plan   1. Acute on Chronic systolic CHF:   -Primarily nonischemic cardiomyopathy.   -Echo in 2017 with EF 15-20%.  [employer] ICD 2018.  -LHC/RHC 11/06/17 showed volume overload with 80% RCA stenosis.  Cardiac index low at 1.91. The degree of coronary disease does not explain his cardiomyopathy.   -PYP scan in 7/21 not suggestive of transthyretin amyloidosis.  -Admitted 07/23 with acute on chronic CHF in setting of acute on chronic respiratory faliure with hypoxia/COPD exacerbation and suspected sepsis. -BNP 532, 300s last few months. -Echo with EF 20-25%, anterior, anteroseptal, anterolateral and apicall akinesis (not noted on prior study). ? If progression in CAD may be contributing to recurrent CHF exacerbations. Mulitple RF. Eventual repeat RHC/LHC, but would like to seen BUN/creatinine back to baseline before this.  -Co-ox 63% yesterday. Recheck this am. -Weight actually down 20 lb from clinic visit in June d/t decreased po intake. Weight here 252 > 259 >259. CVP 6 today. On 60 mg lasix IV BID.  Will D/C IV lasix and start torsemide 80 mg po BID (on 80 mg am/60 mg pm at home). Creatinine improved from 2.37 > 1.87 > 1.64. -GDMT including entresto, eplerenone, hydralazine/imdur, digoxin, bisoprolol and empagliflozin held d/t hypotension and AKI on CKD. BP stable today. Restart hydralazine/imdur.  -Not adherent with medications PTA. Followed by paramedicine in the community. Has not kept most recent clinic f/u. Did not return calls to reschedule home visits this past month.  -CR to see   2. Acute on chronic hypoxic  respiratory failure: -initially requiring BiPAP, now 4-5L 02 [place] -Likely d/t combination of COPD exacerbation and acute on chronic CHF. CCM saw and felt CHF more likely contributing to respiratory failure -Has home 02 but admits he does not use it regularly   3. COPD with possible acute exacerbation:  -Never smoked, but apparently had significant occupational exposure.  It appears that he won a lawsuit dealing with the occupational exposure-related COPD. CPX in 2/21 showed severe functional limitation, but it appeared to be due to pulmonary abnormalities rather than HF. -Treated for  COPD exacerbation per triad   4. Sepsis: -On presentation Lactic acid > 2, WBC 22, tachycardic and tachypneic -WBC trending down 26.8>23.1 -procal 12.4>6.72>4.37 -BC grew Strep and staph, possibly contaminant. Repeat culture X 2 with no growth X 2 day -UA negative -? If lower extremity wounds source of infection. Recent left heel wound deibridement last week -On antibiotics   5. CAD:   -LHC in 5/19 with 80-90% proximal RCA stenosis treated with DES to RCA.  This did not cause his cardiomyopathy but is a large RCA.  - he denies CP.  HS trop flat -See discussion in #1 -Continue aspirin and statin   6. Polycythemia:  -Likely related to chronic hypoxia.   7. OSA:  -Cannot tolerate CPAP, has 02 to wear at night but not adherent   8. DM:  -A1c pending -He is on insulin.   -Add jardiance back as renal function continues to improve -Alc pending   9. Hypertriglyceridemia:  -Off Vascepa due to blood in stool that resolved. -Continue fenofibrate 145 mg daily.    10. AKI on CKD:  -Has stage IIIa CKD at baseline -Baseline creatinine 1.5 -Up to 2.5, 2.37 > 1.87 > 1.64   SDOH: -Not adherent with follow-ups or home care visits. Enrolled in Paramedicine in the community. -Reports access to medications and transportation. Not taking medicines as prescribed. -TOC consult.   Length of Stay: 3  Richard [name],  Richard [name], Richard [name]  01/18/2021, 7:44 AM  Advanced Heart Failure Team Pager [phone] (M-F; 7a - 5p)  Please contact [facility] for night-coverage after hours (5p -7a ) and weekends on [email or web address]   Patient seen with PA, agree with the above note.   Patient had IV Lasix yesterday and this morning, creatinine lower at 1.64 today but BUN still high at 76.  CVP 8 on my read, co-ox 60%.    WBCs remain 23, afebrile.  Group G strep on blood cultures.   General: NAD Neck: No JVD, no thyromegaly or thyroid nodule.  Lungs: Clear to auscultation bilaterally with normal respiratory effort. CV: Nondisplaced PMI.  Heart regular S1/S2, no S3/S4, no murmur.  No peripheral edema.   Abdomen: Soft, nontender, no hepatosplenomegaly, no distention.  Skin: Intact without lesions or rashes.  Neurologic: Alert and oriented x 3.  Psych: Normal affect. Extremities: No clubbing or cyanosis. Lower legs wrapped with ulcerations.  HEENT: Normal.   Group G strep bacteremia.  Patient has ICD.  Suspect leg wounds are source.   - Would consult ID, suspect he is going to need a TEE for ?endocarditis. - Continue current abx, narrow per ID.   CVP 8, not significantly volume overloaded now. Had sepsis syndrome at admission with soft BP and AKI, most of his cardiac meds were held but has been getting IV Lasix.  Co-ox stable at 60%.  - Stop IV Lasix, start torsemide 80 mg bid tomorrow.  - Can restart hydralazine at 10 mg tid and Imdur 30.  - May restart Jardiance, eplerenone tomorrow if BMET and BP stable.   Lower extremity wounds, I see no PAD evaluation.  - Will order peripheral arterial doppler study.   COPD on home oxygen 2L.  Currently on 3L, close to baseline.   Richard [name] 01/18/2021 9:54 AM

## 2021-01-18 NOTE — Consult Note (Signed)
[facility]    Date of Admission:  01/15/2021     Reason for Consult: bacteremia    Referring Provider: Sharolyn Douglas   Lines:  7/25-c RUE picc   Abx: 7/23-c azith 7/23-c ceftriaxone 7/23-c vanc        Assessment: 60 yo male with dm2, copd on home o2 (2L), nonischemic cardiomyopathy s/p AICD 2018, chronic venous stasis, chronic left heel ulcer, ckd admtited 7/23 with acute (24 hours) myalgia/dyspnea/malaise found to have group g bacteremia/sepsis and heart failure exacerbation and AKI on ckd (baseline cr 1.3-1.5)  7/23 bcx group g strep 1 of 2 set 7/24 bcx negative to date  A picc was placed 7/25 (2 days on appropriate abx and negative repeat bcx)  Group g strep is a beta-hemolytic skin flora strep rather than odontogenic. It is also less likely to cause endocarditis/catheter infection than the viridan streptococcus. And he presented with low burden bacteremia  Patient had chronic left heel unstageable ulcer, which is likely the source of the bacteremia. While we are treating the bacteremia, will need to investigate for deep seated soft tissue/bone infection. He'll benefit from vascular/orthopedics evaluation too as this ulcer is likely non-healing and is source for more bacteremia/pacer seeding complication  Tte so far negative for IE/pacer thrombus. Will need tee. If tee is negative, and the left ankle demonstrate no osteomyelitis/abscess, reasonable to treat for 2 weeks  Suspect this is all group g strep sepsis. PNA dx is confounded but doubt, however reasonable to finish 5 day azithromycin   Plan: Would get tee  Continue ceftriaxone for now; finish azithromycin on 7/28 Stop vancomycin Xray of the foot; if negative, would get mri to r/o osteomyelitis Please discuss with vascular surgery/ortho regarding management of the left heel ulcer, which is source for further infection/seeding complication of the pacemaker Discussed with primary  team  I spent 60 minute reviewing data/chart, and coordinating care and >50% direct face to face time providing counseling/discussing diagnostics/treatment plan with patient      ------------------------------------------------ Principal Problem:   Acute on chronic respiratory failure with hypoxia and hypercapnia (HCC) Active Problems:   Hypertension   CKD (chronic kidney disease)   ICD (implantable cardioverter-defibrillator) in place   GERD (gastroesophageal reflux disease)   Diabetes mellitus with diabetic neuropathy, with long-term current use of insulin (HCC)   COPD exacerbation (HCC)   Chronic systolic CHF (congestive heart failure) (HCC)   Acute renal failure superimposed on stage 3 chronic kidney disease (HCC)   Elevated troponin   Hyponatremia    HPI: Richard [name] is a 60 y.o. male with dm2, copd on home o2 (2L), nonischemic cardiomyopathy s/p AICD 2018, chronic venous stasis, chronic left heel ulcer, ckd admtited 7/23 with acute (24 hours) myalgia/dyspnea/malaise found to have group g bacteremia/sepsis and heart failure exacerbation and AKI on ckd (baseline cr 1.3-1.5)   Patient has a day or so where he feels more sob. Has chronic venous stasis without changes. Has left heel ulcer of at least 2 months. Seeign wound clinic. No purulence/pain there or redness/swelling  He also reports 4-5 days intractible hiccup  No subjective f/c, sorethroat, cough, rash, chest pain, abd pain, n/v/diarrhea, joint pain   On admission afebrile, but luekocytosis to 25, o2 requirement up to 5 liters Cxr pulm vascular congestion Bcx returned with group g strep Aki on ckd Chf service followign and diuresing with improvement in dyspnea sx Tte so far without obvious vegetation Treating as  pna/bacteremia-sepsis empirically with azith/vanc/ceftriaxone  Complains of hiccup only when I spoke with him and said his dyspnea much better  Family History  Problem Relation Age of Onset    Hypertension Mother    Diabetes Mother    Hypertension Father    Diabetes Father    Diabetes Sister    Diabetes Brother     Social History   Tobacco Use   Smoking status: Former    Types: Cigarettes   Smokeless tobacco: Never   Tobacco comments:    "smoked when I drank"  Vaping Use   Vaping Use: Never used  Substance Use Topics   Alcohol use: Not Currently    Comment: used to drink multiple cases of beer daily, quit 2018   Drug use: Never    Allergies  Allergen Reactions   Aspirin Itching   Codeine Hives   Coconut Oil Itching    Review of Systems: ROS All Other ROS was negative, except mentioned above   Past Medical History:  Diagnosis Date   AICD (automatic cardioverter/defibrillator) present 01/11/2017   Anxiety    CHF (congestive heart failure) (HCC)    CKD (chronic kidney disease)    COPD (chronic obstructive pulmonary disease) (HCC)    never smoker, industrial exposure   Diabetes mellitus with diabetic neuropathy, with long-term current use of insulin (HCC)    GERD (gastroesophageal reflux disease)    High cholesterol    History of gout    "take RX qd" (11/01/2017)   Hypertension    Nonobstructive atherosclerosis of coronary artery    On home oxygen therapy    "2L prn" (11/01/2017)   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HCC)    LLL, present for years   Systolic HF (heart failure) (HCC)        Scheduled Meds:  allopurinol  100 mg Oral Daily   aspirin EC  81 mg Oral Daily   atorvastatin  40 mg Oral Daily   Chlorhexidine Gluconate Cloth  6 each Topical Daily   Chlorhexidine Gluconate Cloth  6 each Topical Q0600   docusate sodium  100 mg Oral BID   feeding supplement (GLUCERNA SHAKE)  237 mL Oral BID BM   heparin  5,000 Units Subcutaneous Q8H   hydrALAZINE  10 mg Oral Q8H   insulin aspart  0-15 Units Subcutaneous TID WC   insulin aspart  0-5 Units Subcutaneous QHS   insulin aspart  4 Units Subcutaneous TID WC   insulin glargine  10 Units Subcutaneous BID    ipratropium-albuterol  3 mL Nebulization BID   isosorbide mononitrate  30 mg Oral Daily   multivitamin with minerals  1 tablet Oral Daily   mupirocin ointment  1 application Nasal BID   pantoprazole  40 mg Oral QHS   predniSONE  40 mg Oral Q breakfast   Ensure Max Protein  11 oz Oral QHS   senna  1 tablet Oral BID   sodium chloride flush  10-40 mL Intracatheter Q12H   [START ON 01/19/2021] torsemide  80 mg Oral BID   Continuous Infusions:  azithromycin 500 mg (01/17/21 2308)   cefTRIAXone (ROCEPHIN)  IV Stopped (01/18/21 0640)   vancomycin     PRN Meds:.acetaminophen **OR** acetaminophen, albuterol, sodium chloride flush   OBJECTIVE: Blood pressure 108/75, pulse 93, temperature (!) 97.4 F (36.3 C), temperature source Oral, resp. rate 20, height 6\' 5"  (1.956 m), weight 117.5 kg, SpO2 97 %.  Physical Exam General/constitutional: no distress, pleasant; frequent hiccup throughout interview HEENT: Normocephalic,  PER, Conj Clear, EOMI, Oropharynx clear Neck supple CV: rrr no mrg Lungs: clear to auscultation, normal respiratory effort Abd: Soft, Nontender; protuberant with periumbillical noncarcerated hernia Ext: 1+ bilateral LE edema with brawny skin changes Skin: unstageable 2 inch left heel ulcer with dry black eschar; nontender; no purulence Neuro: nonfocal MSK: no peripheral joint swelling/tenderness/warmth; back spines nontender Psych: alert/oriented  Central line presence: RUE picc site no erythema/purulence    Lab Results Lab Results  Component Value Date   WBC 23.1 (H) 01/18/2021   HGB 17.1 (H) 01/18/2021   HCT 51.8 01/18/2021   MCV 88.7 01/18/2021   PLT 348 01/18/2021    Lab Results  Component Value Date   CREATININE 1.64 (H) 01/18/2021   BUN 75 (H) 01/18/2021   NA 135 01/18/2021   K 4.5 01/18/2021   CL 95 (L) 01/18/2021   CO2 29 01/18/2021    Lab Results  Component Value Date   ALT 19 01/18/2021   AST 18 01/18/2021   ALKPHOS 63 01/18/2021   BILITOT  0.9 01/18/2021      Microbiology: Recent Results (from the past 240 hour(s))  Culture, blood (routine x 2)     Status: None (Preliminary result)   Collection Time: 01/15/21  3:41 PM   Specimen: BLOOD RIGHT HAND  Result Value Ref Range Status   Specimen Description BLOOD RIGHT HAND  Final   Special Requests   Final    BOTTLES DRAWN AEROBIC AND ANAEROBIC Blood Culture adequate volume   Culture   Final    NO GROWTH 3 DAYS Performed at [facility], [address] [place], [place] [address]    Report Status PENDING  Incomplete  Resp Panel by RT-PCR (Flu A&B, Covid) Nasopharyngeal Swab     Status: None   Collection Time: 01/15/21  3:45 PM   Specimen: Nasopharyngeal Swab; Nasopharyngeal(NP) swabs in vial transport medium  Result Value Ref Range Status   SARS Coronavirus 2 by RT PCR NEGATIVE NEGATIVE Final    Comment: (NOTE) SARS-CoV-2 target nucleic acids are NOT DETECTED.  The SARS-CoV-2 RNA is generally detectable in upper respiratory specimens during the acute phase of infection. The lowest concentration of SARS-CoV-2 viral copies this assay can detect is 138 copies/mL. A negative result does not preclude SARS-Cov-2 infection and should not be used as the sole basis for treatment or other patient management decisions. A negative result may occur with  improper specimen collection/handling, submission of specimen other than nasopharyngeal swab, presence of viral mutation(s) within the areas targeted by this assay, and inadequate number of viral copies(<138 copies/mL). A negative result must be combined with clinical observations, patient history, and epidemiological information. The expected result is Negative.  Fact Sheet for Patients:  BloggerCourse.com  Fact Sheet for Healthcare Providers:  SeriousBroker.it  This test is no t yet approved or cleared by the [place] FDA and  has been authorized for detection  and/or diagnosis of SARS-CoV-2 by FDA under an Emergency Use Authorization (EUA). This EUA will remain  in effect (meaning this test can be used) for the duration of the COVID-19 declaration under Section 564(b)(1) of the Act, 21 U.S.C.section 360bbb-3(b)(1), unless the authorization is terminated  or revoked sooner.       Influenza A by PCR NEGATIVE NEGATIVE Final   Influenza B by PCR NEGATIVE NEGATIVE Final    Comment: (NOTE) The Xpert Xpress SARS-CoV-2/FLU/RSV plus assay is intended as an aid in the diagnosis of influenza from Nasopharyngeal swab specimens and should  not be used as a sole basis for treatment. Nasal washings and aspirates are unacceptable for Xpert Xpress SARS-CoV-2/FLU/RSV testing.  Fact Sheet for Patients: BloggerCourse.com  Fact Sheet for Healthcare Providers: SeriousBroker.it  This test is not yet approved or cleared by the [place] FDA and has been authorized for detection and/or diagnosis of SARS-CoV-2 by FDA under an Emergency Use Authorization (EUA). This EUA will remain in effect (meaning this test can be used) for the duration of the COVID-19 declaration under Section 564(b)(1) of the Act, 21 U.S.C. section 360bbb-3(b)(1), unless the authorization is terminated or revoked.  Performed at [id number], [address] [place], [place] [address]   Culture, blood (routine x 2)     Status: Abnormal (Preliminary result)   Collection Time: 01/15/21  6:05 PM   Specimen: BLOOD LEFT FOREARM  Result Value Ref Range Status   Specimen Description BLOOD LEFT FOREARM  Final   Special Requests   Final    BOTTLES DRAWN AEROBIC AND ANAEROBIC Blood Culture adequate volume   Culture  Setup Time   Final    GRAM POSITIVE COCCI IN CHAINS AEROBIC BOTTLE ONLY CRITICAL RESULT CALLED TO, READ BACK BY AND VERIFIED WITH: PHARM D K.HAMMONS ON [id number] AT 0935 BY E.PARRISH Performed at [facility],  [address], [place], [place] [address]    Culture STREPTOCOCCUS GROUP G (A)  Final   Report Status PENDING  Incomplete   Organism ID, Bacteria STREPTOCOCCUS GROUP G  Final      Susceptibility   Streptococcus group g - MIC*    CLINDAMYCIN RESISTANT Resistant     AMPICILLIN <=0.25 SENSITIVE Sensitive     ERYTHROMYCIN 4 RESISTANT Resistant     VANCOMYCIN 0.5 SENSITIVE Sensitive     CEFTRIAXONE <=0.12 SENSITIVE Sensitive     LEVOFLOXACIN 1 SENSITIVE Sensitive     * STREPTOCOCCUS GROUP G  Blood Culture ID Panel (Reflexed)     Status: Abnormal   Collection Time: 01/15/21  6:05 PM  Result Value Ref Range Status   Enterococcus faecalis NOT DETECTED NOT DETECTED Final   Enterococcus Faecium NOT DETECTED NOT DETECTED Final   Listeria monocytogenes NOT DETECTED NOT DETECTED Final   Staphylococcus species DETECTED (A) NOT DETECTED Final    Comment: CRITICAL RESULT CALLED TO, READ BACK BY AND VERIFIED WITH: PHARM D K.HAMMONS ON [id number] AT 0935 BY E.PARRISH    Staphylococcus aureus (BCID) NOT DETECTED NOT DETECTED Final   Staphylococcus epidermidis NOT DETECTED NOT DETECTED Final   Staphylococcus lugdunensis NOT DETECTED NOT DETECTED Final   Streptococcus species DETECTED (A) NOT DETECTED Final    Comment: Not Enterococcus species, Streptococcus agalactiae, Streptococcus pyogenes, or Streptococcus pneumoniae. CRITICAL RESULT CALLED TO, READ BACK BY AND VERIFIED WITH: PHARM D K.HAMMONS ON [id number] AT 0935 BY E.PARRISH    Streptococcus agalactiae NOT DETECTED NOT DETECTED Final   Streptococcus pneumoniae NOT DETECTED NOT DETECTED Final   Streptococcus pyogenes NOT DETECTED NOT DETECTED Final   A.calcoaceticus-baumannii NOT DETECTED NOT DETECTED Final   Bacteroides fragilis NOT DETECTED NOT DETECTED Final   Enterobacterales NOT DETECTED NOT DETECTED Final   Enterobacter cloacae complex NOT DETECTED NOT DETECTED Final   Escherichia coli NOT DETECTED NOT DETECTED Final   Klebsiella aerogenes NOT  DETECTED NOT DETECTED Final   Klebsiella oxytoca NOT DETECTED NOT DETECTED Final   Klebsiella pneumoniae NOT DETECTED NOT DETECTED Final   Proteus species NOT DETECTED NOT DETECTED Final   Salmonella species NOT DETECTED NOT DETECTED Final  Serratia marcescens NOT DETECTED NOT DETECTED Final   Haemophilus influenzae NOT DETECTED NOT DETECTED Final   Neisseria meningitidis NOT DETECTED NOT DETECTED Final   Pseudomonas aeruginosa NOT DETECTED NOT DETECTED Final   Stenotrophomonas maltophilia NOT DETECTED NOT DETECTED Final   Candida albicans NOT DETECTED NOT DETECTED Final   Candida auris NOT DETECTED NOT DETECTED Final   Candida glabrata NOT DETECTED NOT DETECTED Final   Candida krusei NOT DETECTED NOT DETECTED Final   Candida parapsilosis NOT DETECTED NOT DETECTED Final   Candida tropicalis NOT DETECTED NOT DETECTED Final   Cryptococcus neoformans/gattii NOT DETECTED NOT DETECTED Final    Comment: Performed at [facility], [address], [place], [place] [address]  MRSA Next Gen by PCR, Nasal     Status: Abnormal   Collection Time: 01/15/21 10:38 PM   Specimen: Nasal Mucosa; Nasal Swab  Result Value Ref Range Status   MRSA by PCR Next Gen DETECTED (A) NOT DETECTED Final    Comment: RESULT CALLED TO, READ BACK BY AND VERIFIED WITH: RN M.SHULL ON [id number] AT 1121 BY E.PARRISH (NOTE) The GeneXpert MRSA Assay (FDA approved for NASAL specimens only), is one component of a comprehensive MRSA colonization surveillance program. It is not intended to diagnose MRSA infection nor to guide or monitor treatment for MRSA infections. Test performance is not FDA approved in patients less than 56 years old. Performed at [facility], [address], [place], [place] [address]   Culture, blood (routine x 2)     Status: None (Preliminary result)   Collection Time: 01/16/21 11:38 AM   Specimen: BLOOD  Result Value Ref Range Status   Specimen Description BLOOD RIGHT ANTECUBITAL  Final    Special Requests   Final    BOTTLES DRAWN AEROBIC ONLY Blood Culture adequate volume   Culture   Final    NO GROWTH 2 DAYS Performed at [facility], [address], [place], [place] [address]    Report Status PENDING  Incomplete  Culture, blood (routine x 2)     Status: None (Preliminary result)   Collection Time: 01/16/21 11:39 AM   Specimen: BLOOD RIGHT HAND  Result Value Ref Range Status   Specimen Description BLOOD RIGHT HAND  Final   Special Requests   Final    BOTTLES DRAWN AEROBIC ONLY Blood Culture results may not be optimal due to an inadequate volume of blood received in culture bottles   Culture   Final    NO GROWTH 2 DAYS Performed at [facility], [address], [place], [place] [address]    Report Status PENDING  Incomplete     Serology:    Imaging: If present, new imagings (plain films, ct scans, and mri) have been personally visualized and interpreted; radiology reports have been reviewed. Decision making incorporated into the Impression / Recommendations.  7/23 cxr 1. Cardiomegaly with mild central vascular congestion. 2. No evidence of bowel obstruction.  7/24 tte 1. The anterior,anteroseptal,anterolateral walls are akinetic. The apex  is akinetic. Marland Kitchen Left ventricular ejection fraction, by estimation, is 20 to  25%. The left ventricle has severely decreased function. The left  ventricle demonstrates regional wall  motion abnormalities (see scoring diagram/findings for description). The  left ventricular internal cavity size was moderately to severely dilated.  Left ventricular diastolic parameters are indeterminate.   2. RV poorly visualized. Grossly appears mildly enlarged with low normal  to mildly decreased function. Right ventricular systolic function was not  well visualized. The right ventricular size is not well visualized.  Tricuspid regurgitation signal is  inadequate for assessing PA pressure.   3. Left atrial size was mildly  dilated.   4. The mitral valve is normal in structure. No evidence of mitral valve  regurgitation. No evidence of mitral stenosis.   5. The aortic valve was not well visualized. Aortic valve regurgitation  is not visualized. No aortic stenosis is present  Raymondo Bandrung T Harith Mccadden, MD [facility] [phone] pager    01/18/2021, 10:55 AM

## 2021-01-18 NOTE — Progress Notes (Signed)
Inpatient Diabetes Program Recommendations  AACE/ADA: New Consensus Statement on Inpatient Glycemic Control (2015)  Target Ranges:  Prepandial:   less than 140 mg/dL      Peak postprandial:   less than 180 mg/dL (1-2 hours)      Critically ill patients:  140 - 180 mg/dL   Lab Results  Component Value Date   GLUCAP 201 (H) 01/18/2021   HGBA1C 10.3 (H) 01/17/2021    Review of Glycemic Control Results for DAKHARI, ZUVER (MRN [id number]) as of 01/18/2021 08:50  Ref. Range 01/17/2021 16:39 01/17/2021 20:31 01/18/2021 05:47 01/18/2021 07:35  Glucose-Capillary Latest Ref Range: 70 - 99 mg/dL 644 (H) 034 (H) 742 (H) 201 (H)   Diabetes history: Type 2 DM Outpatient Diabetes medications: Humalog U-500 SSI, Jardiance 25 mg QD Current orders for Inpatient glycemic control: Lantus 10 units BID, Novolog 6 units TID, Novolog 0-15 units & HS Prednisone 40 mg QAM  Inpatient Diabetes Program Recommendations:     With steroids, consider increasing Lantus to 14 units BID.   Thanks, Lujean Rave, MSN, RNC-OB Diabetes Coordinator [phone] (8a-5p)

## 2021-01-18 NOTE — Progress Notes (Signed)
Attempted ABI, however patient stated that he just had this done last week and he doesn't need it again. Per Care Everywhere, patient had ABI completed 01/07/2021 at [facility], however unable to retrieve results at this time. Will d/c order, please re-order if repeat is needed.   01/18/2021 3:07 PM Eula Fried., MHA, RVT, RDCS, RDMS

## 2021-01-18 NOTE — Progress Notes (Signed)
This morning, I was told to skip breakfast sliding scale insulin and give only breakfast meal bolus with lantus per MD.

## 2021-01-18 NOTE — Progress Notes (Signed)
Patient decided he wanted to try BIPAP for better rest.  Placed patient on BIPAP patient is sitting up in bed and states he want to stay that way because he sleeps better.  Patient tolerating at this time.

## 2021-01-18 NOTE — Progress Notes (Signed)
Pharmacy Antibiotic Note  Richard [name] is a 60 y.o. male admitted on 01/15/2021 with  shortness of breath and hypoxia on home O2 .  On admission patient was started empirically on Rocephin and Azithromycin for possible COPD exacerbation.  BCx obtained with 1/4 bottles growing staph and strep species.  Communicated information to MD who would like to add Vancomycin to current abx regimen to cover for possible staph bacteremia.  SCr has improved, pending ID consult.  Plan: Adjust vancomycin to 1750mg  IV q24h - est AUC 524 Continue ceftriaxone + azithromycin  Height: 6\' 5"  (195.6 cm) Weight: 117.5 kg (259 lb 0.7 oz) IBW/kg (Calculated) : 89.1  Temp (24hrs), Avg:97.9 F (36.6 C), Min:97.4 F (36.3 C), Max:98.3 F (36.8 C)  Recent Labs  Lab 01/15/21 1545 01/15/21 1805 01/15/21 2307 01/16/21 0323 01/17/21 0538 01/18/21 0325  WBC 22.9*  --   --  24.8* 26.8* 23.1*  CREATININE 2.26*  --   --  2.37* 1.87* 1.64*  LATICACIDVEN 2.4* 2.1* 1.8 2.1* 1.8  --      Estimated Creatinine Clearance: 69.8 mL/min (A) (by C-G formula based on SCr of 1.64 mg/dL (H)).    Allergies  Allergen Reactions   Aspirin Itching   Codeine Hives   Coconut Oil Itching    Antimicrobials this admission: CTX 7/23 >> Azithro 7/23 >> Vanc 7/24 >>  Dose adjustments this admission:   Microbiology results: 7/23 BCx x 2 >> 1/4 with CPC chains BCID > staph sp and strep sp, no resistance 7/24 BCx >> pending   8/23, PharmD, BCPS, [facility] Clinical Pharmacist [phone] Please check AMION for all [facility] Pharmacy numbers 01/18/2021

## 2021-01-18 NOTE — Progress Notes (Addendum)
Due to  PROGRESS NOTE  Richard [name] QAS:[id number] DOB: 08-23-1960 DOA: 01/15/2021 PCP: Ananias Pilgrim, MD  HPI/Recap of past 24 hours: Richard [name] is a 60 y.o. male with medical history significant of DM2, COPD on chronic O2 2 L, systolic CHF sp AICD, venous stasis ulcer, HTN, CKD, pulmonary nodule (hamartoma) presented with increased SOB, no fever, no CP. Noted to have  Sp02 78% on RA with EMS. 4L [place] applied EMS gave 1 Neb, 125mg  Solumedrol IV and 4mg  Zofran IV. Pt remained on 4 L despite repeated neb treatments. Hx of severe COPD on O2 former smoker. Pt was hospitalized at [facility] in April of this year for CHF exacerbation requiring aggressive diuresis. Chronic leg wounds followed by wound care.  PCCM was consulted for worsening hypoxia and patient being on BiPAP.  Patient admitted for further management.    Today, patient still reports feeling badly overall, still short of breath, but denies any chest pain, abdominal pain, nausea/vomiting, fever/chills.  Noted to have intractable hiccups.   Assessment/Plan: Principal Problem:   Acute on chronic respiratory failure with hypoxia and hypercapnia (HCC) Active Problems:   Hypertension   CKD (chronic kidney disease)   Presence of implantable cardioverter-defibrillator (ICD)   GERD (gastroesophageal reflux disease)   Diabetes mellitus with diabetic neuropathy, with long-term current use of insulin (HCC)   COPD exacerbation (HCC)   Chronic systolic CHF (congestive heart failure) (HCC)   Acute renal failure superimposed on stage 3 chronic kidney disease (HCC)   Elevated troponin   Hyponatremia   Bacteremia   Chronic ulcer of left ankle (HCC)   Severe sepsis without septic shock (HCC)   Acute on chronic hypoxic respiratory failure Acute on chronic combined HF, AICD-denies any device shocks Patient required BiPAP overnight with sats dropping to 78%, now on 3 L of O2 BNP 532, troponin flat trend EKG nonischemic Chest x-ray with mild  vascular congestion Echo showed LVEF 20-25%, RWMA Cardiology on board, appreciate recs Restart hydralazine, Imdur, torsemide Hold home bisoprolol, digoxin, eplerenone, Entresto, plan to restart pending AKI, hypotension Strict I's and O's, daily weights Telemetry  Severe sepsis likely 2/2 group G bacteremia Likely 2/2 chronic left heel unstageable ulcer On admission tachycardic, tachypneic, LA >2, leukocytosis Procalcitonin elevated, will trend Chest x-ray as above with congestion, UA unremarkable BC x2 grew 1/4 Streptococcus grp G (awaiting identification), Staphylococcus, repeat NGTD ID on board, appreciate recs, recommend TEE (cardiology notified), x-ray of foot pending Consult to either vascular or orthopedics once x-ray foot results as per ID recommendation Continue azithromycin, start penicillin G discontinue ceftriaxone, vancomycin Monitor closely  Possible COPD exacerbation Continue azithromycin, prednisone, plan to taper, duo nebs as needed Supplemental oxygen as needed  Diabetes mellitus type 2 CBGs uncontrolled, worsened by steroid use A1c 10.3 SSI, Lantus, Accu-Cheks, hypoglycemic protocol  AKI on CKD stage III Baseline around 1.5 Daily CMP  CAD/HLD S/p PCI Currently chest pain-free Continue aspirin, statin  Hypertension BP soft Management as above  Chronic venous stasis ulcer S/p debridement of left foot wounds on 01/13/2021 at OSH WOC consulted Daily wound care     Estimated body mass index is 30.72 kg/m as calculated from the following:   Height as of this encounter: 6\' 5"  (1.956 m).   Weight as of this encounter: 117.5 kg.     Code Status: Full  Family Communication: None at bedside  Disposition Plan: Status is: Inpatient  Remains inpatient appropriate because:Inpatient level of care appropriate due to severity of illness  Dispo: The  patient is from: Home              Anticipated d/c is to: Home              Patient currently is not  medically stable to d/c.   Difficult to place patient No      Consultants: PCCM Cardiology ID  Procedures: None  Antimicrobials: Azithromycin Penicillin G  DVT prophylaxis: Heparin [place]   Objective: Vitals:   01/18/21 1102 01/18/21 1423 01/18/21 1634 01/18/21 1930  BP: 111/74 (!) 123/103 118/81 111/76  Pulse:  83 88 85  Resp: 19 20 20  (!) 22  Temp:  97.9 F (36.6 C) 97.6 F (36.4 C) 98.6 F (37 C)  TempSrc:  Oral Oral Oral  SpO2:  96% 96% 92%  Weight:      Height:        Intake/Output Summary (Last 24 hours) at 01/18/2021 1940 Last data filed at 01/18/2021 1825 Gross per 24 hour  Intake 653 ml  Output 1600 ml  Net -947 ml   Filed Weights   01/16/21 0558 01/17/21 0616 01/18/21 0548  Weight: 114.4 kg 117.5 kg 117.5 kg    Exam: General: NAD Cardiovascular: S1, S2 present Respiratory: Diminished breath sounds bilaterally Abdomen: Soft, nontender, nondistended, bowel sounds present Musculoskeletal: Trace bilateral pedal edema noted, BLE with Ace wrap Skin: Multiple chronic wound on BLE with Ace wrap Psychiatry: Normal mood     Data Reviewed: CBC: Recent Labs  Lab 01/15/21 1545 01/15/21 1716 01/15/21 2045 01/16/21 0323 01/17/21 0538 01/18/21 0325  WBC 22.9*  --   --  24.8* 26.8* 23.1*  NEUTROABS 18.9*  --   --  22.5* 23.9* 19.6*  HGB 17.8* 19.4* 19.0* 16.5 17.0 17.1*  HCT 53.8* 57.0* 56.0* 51.3 53.5* 51.8  MCV 87.8  --   --  89.2 88.6 88.7  PLT 276  --   --  295 346 348   Basic Metabolic Panel: Recent Labs  Lab 01/15/21 1545 01/15/21 1716 01/15/21 2045 01/15/21 2307 01/16/21 0323 01/17/21 0538 01/18/21 0325  NA 131* 132* 134*  --  135 135 135  K 4.8 4.6 4.9  --  5.2* 4.9 4.5  CL 90*  --   --   --  93* 97* 95*  CO2 25  --   --   --  27 28 29   GLUCOSE 362*  --   --   --  454* 248* 178*  BUN 68*  --   --   --  77* 82* 75*  CREATININE 2.26*  --   --   --  2.37* 1.87* 1.64*  CALCIUM 8.9  --   --   --  8.9 8.7* 8.7*  MG  --   --   --   2.5* 2.7*  --   --   PHOS  --   --   --  6.2* 6.9*  --   --    GFR: Estimated Creatinine Clearance: 69.8 mL/min (A) (by C-G formula based on SCr of 1.64 mg/dL (H)). Liver Function Tests: Recent Labs  Lab 01/15/21 1545 01/16/21 0323 01/17/21 0538 01/18/21 0325  AST 23 18 20 18   ALT 22 20 22 19   ALKPHOS 73 78 71 63  BILITOT 1.7* 1.2 0.7 0.9  PROT 8.1 7.6 7.4 7.9  ALBUMIN 2.5* 2.1* 2.0* 2.2*   No results for input(s): LIPASE, AMYLASE in the last 168 hours. No results for input(s): AMMONIA in the last 168 hours. Coagulation Profile: No  results for input(s): INR, PROTIME in the last 168 hours. Cardiac Enzymes: Recent Labs  Lab 01/15/21 2307  CKTOTAL 101   BNP (last 3 results) No results for input(s): PROBNP in the last 8760 hours. HbA1C: Recent Labs    01/17/21 0538  HGBA1C 10.3*   CBG: Recent Labs  Lab 01/17/21 2031 01/18/21 0547 01/18/21 0735 01/18/21 1126 01/18/21 1639  GLUCAP 187* 156* 201* 184* 194*   Lipid Profile: No results for input(s): CHOL, HDL, LDLCALC, TRIG, CHOLHDL, LDLDIRECT in the last 72 hours. Thyroid Function Tests: Recent Labs    01/16/21 0323  TSH 0.473   Anemia Panel: No results for input(s): VITAMINB12, FOLATE, FERRITIN, TIBC, IRON, RETICCTPCT in the last 72 hours. Urine analysis:    Component Value Date/Time   COLORURINE YELLOW 01/16/2021 0700   APPEARANCEUR CLEAR 01/16/2021 0700   LABSPEC 1.015 01/16/2021 0700   PHURINE 5.0 01/16/2021 0700   GLUCOSEU >=500 (A) 01/16/2021 0700   HGBUR NEGATIVE 01/16/2021 0700   BILIRUBINUR NEGATIVE 01/16/2021 0700   KETONESUR NEGATIVE 01/16/2021 0700   PROTEINUR NEGATIVE 01/16/2021 0700   NITRITE NEGATIVE 01/16/2021 0700   LEUKOCYTESUR NEGATIVE 01/16/2021 0700   Sepsis Labs: @LABRCNTIP (procalcitonin:4,lacticidven:4)  ) Recent Results (from the past 240 hour(s))  Culture, blood (routine x 2)     Status: None (Preliminary result)   Collection Time: 01/15/21  3:41 PM   Specimen: BLOOD  RIGHT HAND  Result Value Ref Range Status   Specimen Description BLOOD RIGHT HAND  Final   Special Requests   Final    BOTTLES DRAWN AEROBIC AND ANAEROBIC Blood Culture adequate volume   Culture   Final    NO GROWTH 3 DAYS Performed at [facility], [address], [place], [place]    Report Status PENDING  Incomplete  Resp Panel by RT-PCR (Flu A&B, Covid) Nasopharyngeal Swab     Status: None   Collection Time: 01/15/21  3:45 PM   Specimen: Nasopharyngeal Swab; Nasopharyngeal(NP) swabs in vial transport medium  Result Value Ref Range Status   SARS Coronavirus 2 by RT PCR NEGATIVE NEGATIVE Final    Comment: (NOTE) SARS-CoV-2 target nucleic acids are NOT DETECTED.  The SARS-CoV-2 RNA is generally detectable in upper respiratory specimens during the acute phase of infection. The lowest concentration of SARS-CoV-2 viral copies this assay can detect is 138 copies/mL. A negative result does not preclude SARS-Cov-2 infection and should not be used as the sole basis for treatment or other patient management decisions. A negative result may occur with  improper specimen collection/handling, submission of specimen other than nasopharyngeal swab, presence of viral mutation(s) within the areas targeted by this assay, and inadequate number of viral copies(<138 copies/mL). A negative result must be combined with clinical observations, patient history, and epidemiological information. The expected result is Negative.  Fact Sheet for Patients:  01/17/21  Fact Sheet for Healthcare Providers:  BloggerCourse.com  This test is no t yet approved or cleared by the [employer] FDA and  has been authorized for detection and/or diagnosis of SARS-CoV-2 by FDA under an Emergency Use Authorization (EUA). This EUA will remain  in effect (meaning this test can be used) for the duration of the COVID-19 declaration under Section  564(b)(1) of the Act, 21 U.S.C.section 360bbb-3(b)(1), unless the authorization is terminated  or revoked sooner.       Influenza A by PCR NEGATIVE NEGATIVE Final   Influenza B by PCR NEGATIVE NEGATIVE Final    Comment: (NOTE) The Xpert Xpress  SARS-CoV-2/FLU/RSV plus assay is intended as an aid in the diagnosis of influenza from Nasopharyngeal swab specimens and should not be used as a sole basis for treatment. Nasal washings and aspirates are unacceptable for Xpert Xpress SARS-CoV-2/FLU/RSV testing.  Fact Sheet for Patients: BloggerCourse.com  Fact Sheet for Healthcare Providers: SeriousBroker.it  This test is not yet approved or cleared by the [place] FDA and has been authorized for detection and/or diagnosis of SARS-CoV-2 by FDA under an Emergency Use Authorization (EUA). This EUA will remain in effect (meaning this test can be used) for the duration of the COVID-19 declaration under Section 564(b)(1) of the Act, 21 U.S.C. section 360bbb-3(b)(1), unless the authorization is terminated or revoked.  Performed at [facility], [address], [place], [place] [address]   Culture, blood (routine x 2)     Status: Abnormal (Preliminary result)   Collection Time: 01/15/21  6:05 PM   Specimen: BLOOD LEFT FOREARM  Result Value Ref Range Status   Specimen Description BLOOD LEFT FOREARM  Final   Special Requests   Final    BOTTLES DRAWN AEROBIC AND ANAEROBIC Blood Culture adequate volume   Culture  Setup Time   Final    GRAM POSITIVE COCCI IN CHAINS AEROBIC BOTTLE ONLY CRITICAL RESULT CALLED TO, READ BACK BY AND VERIFIED WITH: PHARM D K.HAMMONS ON [id number] AT 0935 BY E.PARRISH    Culture STREPTOCOCCUS GROUP G (A)  Final   Report Status PENDING  Incomplete   Organism ID, Bacteria STREPTOCOCCUS GROUP G  Final      Susceptibility   Streptococcus group g - MIC*    CLINDAMYCIN RESISTANT Resistant     AMPICILLIN <=0.25  SENSITIVE Sensitive     ERYTHROMYCIN 4 RESISTANT Resistant     VANCOMYCIN 0.5 SENSITIVE Sensitive     CEFTRIAXONE <=0.12 SENSITIVE Sensitive     LEVOFLOXACIN 1 SENSITIVE Sensitive     PENICILLIN Value in next row Sensitive      SENSITIVEMIC <=0.06Performed at [facility], [address], [place], [place] [address]    * STREPTOCOCCUS GROUP G  Blood Culture ID Panel (Reflexed)     Status: Abnormal   Collection Time: 01/15/21  6:05 PM  Result Value Ref Range Status   Enterococcus faecalis NOT DETECTED NOT DETECTED Final   Enterococcus Faecium NOT DETECTED NOT DETECTED Final   Listeria monocytogenes NOT DETECTED NOT DETECTED Final   Staphylococcus species DETECTED (A) NOT DETECTED Final    Comment: CRITICAL RESULT CALLED TO, READ BACK BY AND VERIFIED WITH: PHARM D K.HAMMONS ON [id number] AT 0935 BY E.PARRISH    Staphylococcus aureus (BCID) NOT DETECTED NOT DETECTED Final   Staphylococcus epidermidis NOT DETECTED NOT DETECTED Final   Staphylococcus lugdunensis NOT DETECTED NOT DETECTED Final   Streptococcus species DETECTED (A) NOT DETECTED Final    Comment: Not Enterococcus species, Streptococcus agalactiae, Streptococcus pyogenes, or Streptococcus pneumoniae. CRITICAL RESULT CALLED TO, READ BACK BY AND VERIFIED WITH: PHARM D K.HAMMONS ON [id number] AT 0935 BY E.PARRISH    Streptococcus agalactiae NOT DETECTED NOT DETECTED Final   Streptococcus pneumoniae NOT DETECTED NOT DETECTED Final   Streptococcus pyogenes NOT DETECTED NOT DETECTED Final   A.calcoaceticus-baumannii NOT DETECTED NOT DETECTED Final   Bacteroides fragilis NOT DETECTED NOT DETECTED Final   Enterobacterales NOT DETECTED NOT DETECTED Final   Enterobacter cloacae complex NOT DETECTED NOT DETECTED Final   Escherichia coli NOT DETECTED NOT DETECTED Final   Klebsiella aerogenes NOT DETECTED NOT DETECTED Final   Klebsiella oxytoca  NOT DETECTED NOT DETECTED Final   Klebsiella pneumoniae NOT DETECTED NOT DETECTED Final    Proteus species NOT DETECTED NOT DETECTED Final   Salmonella species NOT DETECTED NOT DETECTED Final   Serratia marcescens NOT DETECTED NOT DETECTED Final   Haemophilus influenzae NOT DETECTED NOT DETECTED Final   Neisseria meningitidis NOT DETECTED NOT DETECTED Final   Pseudomonas aeruginosa NOT DETECTED NOT DETECTED Final   Stenotrophomonas maltophilia NOT DETECTED NOT DETECTED Final   Candida albicans NOT DETECTED NOT DETECTED Final   Candida auris NOT DETECTED NOT DETECTED Final   Candida glabrata NOT DETECTED NOT DETECTED Final   Candida krusei NOT DETECTED NOT DETECTED Final   Candida parapsilosis NOT DETECTED NOT DETECTED Final   Candida tropicalis NOT DETECTED NOT DETECTED Final   Cryptococcus neoformans/gattii NOT DETECTED NOT DETECTED Final    Comment: Performed at [facility], [address] [place], [place] [address]  MRSA Next Gen by PCR, Nasal     Status: Abnormal   Collection Time: 01/15/21 10:38 PM   Specimen: Nasal Mucosa; Nasal Swab  Result Value Ref Range Status   MRSA by PCR Next Gen DETECTED (A) NOT DETECTED Final    Comment: RESULT CALLED TO, READ BACK BY AND VERIFIED WITH: RN M.SHULL ON [id number] AT 1121 BY E.PARRISH (NOTE) The GeneXpert MRSA Assay (FDA approved for NASAL specimens only), is one component of a comprehensive MRSA colonization surveillance program. It is not intended to diagnose MRSA infection nor to guide or monitor treatment for MRSA infections. Test performance is not FDA approved in patients less than 5 years old. Performed at [facility], [address], [place], [place] [address]   Culture, blood (routine x 2)     Status: None (Preliminary result)   Collection Time: 01/16/21 11:38 AM   Specimen: BLOOD  Result Value Ref Range Status   Specimen Description BLOOD RIGHT ANTECUBITAL  Final   Special Requests   Final    BOTTLES DRAWN AEROBIC ONLY Blood Culture adequate volume   Culture   Final    NO GROWTH 2 DAYS Performed  at [facility], [address] [place], [place] [address]    Report Status PENDING  Incomplete  Culture, blood (routine x 2)     Status: None (Preliminary result)   Collection Time: 01/16/21 11:39 AM   Specimen: BLOOD RIGHT HAND  Result Value Ref Range Status   Specimen Description BLOOD RIGHT HAND  Final   Special Requests   Final    BOTTLES DRAWN AEROBIC ONLY Blood Culture results may not be optimal due to an inadequate volume of blood received in culture bottles   Culture   Final    NO GROWTH 2 DAYS Performed at [facility], [address] [place], [place] [address]    Report Status PENDING  Incomplete      Studies: No results found.  Scheduled Meds:  allopurinol  100 mg Oral Daily   aspirin EC  81 mg Oral Daily   atorvastatin  40 mg Oral Daily   Chlorhexidine Gluconate Cloth  6 each Topical Daily   Chlorhexidine Gluconate Cloth  6 each Topical Q0600   docusate sodium  100 mg Oral BID   feeding supplement (GLUCERNA SHAKE)  237 mL Oral BID BM   heparin  5,000 Units Subcutaneous Q8H   hydrALAZINE  10 mg Oral Q8H   insulin aspart  0-15 Units Subcutaneous TID WC   insulin aspart  0-5 Units  Subcutaneous QHS   insulin aspart  4 Units Subcutaneous TID WC   insulin glargine  10 Units Subcutaneous BID   ipratropium-albuterol  3 mL Nebulization BID   isosorbide mononitrate  30 mg Oral Daily   multivitamin with minerals  1 tablet Oral Daily   mupirocin ointment  1 application Nasal BID   pantoprazole  40 mg Oral QHS   predniSONE  40 mg Oral Q breakfast   Ensure Max Protein  11 oz Oral QHS   senna  1 tablet Oral BID   sodium chloride flush  10-40 mL Intracatheter Q12H   [START ON 01/19/2021] torsemide  80 mg Oral BID    Continuous Infusions:  azithromycin 500 mg (01/17/21 2308)   penicillin g continuous IV infusion       LOS: 3 days     Briant CedarNkeiruka J Kimm Ungaro, MD [facility]  If 7PM-7AM, please contact night-coverage [email or web address] 01/18/2021, 7:40  PM

## 2021-01-19 ENCOUNTER — Encounter (HOSPITAL_COMMUNITY)
Admission: EM | Disposition: A | Discharge status: Rehabilitation Facility | Payer: Self-pay | Source: Home / Self Care | Attending: Internal Medicine

## 2021-01-19 DIAGNOSIS — J9622 Acute and chronic respiratory failure with hypercapnia: Secondary | ICD-10-CM | POA: Diagnosis not present

## 2021-01-19 DIAGNOSIS — T148XXA Other injury of unspecified body region, initial encounter: Secondary | ICD-10-CM

## 2021-01-19 DIAGNOSIS — R7881 Bacteremia: Secondary | ICD-10-CM | POA: Diagnosis not present

## 2021-01-19 DIAGNOSIS — I5022 Chronic systolic (congestive) heart failure: Secondary | ICD-10-CM | POA: Diagnosis not present

## 2021-01-19 DIAGNOSIS — J9621 Acute and chronic respiratory failure with hypoxia: Secondary | ICD-10-CM | POA: Diagnosis not present

## 2021-01-19 DIAGNOSIS — Z95810 Presence of automatic (implantable) cardiac defibrillator: Secondary | ICD-10-CM | POA: Diagnosis not present

## 2021-01-19 LAB — COOXEMETRY PANEL
Carboxyhemoglobin: 1 % (ref 0.5–1.5)
Methemoglobin: 1.2 % (ref 0.0–1.5)
O2 Saturation: 70.5 %
Total hemoglobin: 17.1 g/dL — ABNORMAL HIGH (ref 12.0–16.0)

## 2021-01-19 LAB — COMPREHENSIVE METABOLIC PANEL
ALT: 25 U/L (ref 0–44)
AST: 26 U/L (ref 15–41)
Albumin: 2.2 g/dL — ABNORMAL LOW (ref 3.5–5.0)
Alkaline Phosphatase: 70 U/L (ref 38–126)
Anion gap: 9 (ref 5–15)
BUN: 65 mg/dL — ABNORMAL HIGH (ref 6–20)
CO2: 30 mmol/L (ref 22–32)
Calcium: 8.5 mg/dL — ABNORMAL LOW (ref 8.9–10.3)
Chloride: 90 mmol/L — ABNORMAL LOW (ref 98–111)
Creatinine, Ser: 1.57 mg/dL — ABNORMAL HIGH (ref 0.61–1.24)
GFR, Estimated: 51 mL/min — ABNORMAL LOW (ref >60)
Glucose, Bld: 253 mg/dL — ABNORMAL HIGH (ref 70–99)
Potassium: >7.5 mmol/L (ref 3.5–5.1)
Sodium: 129 mmol/L — ABNORMAL LOW (ref 135–145)
Total Bilirubin: 1.4 mg/dL — ABNORMAL HIGH (ref 0.3–1.2)
Total Protein: 8.1 g/dL (ref 6.5–8.1)

## 2021-01-19 LAB — CBC WITH DIFFERENTIAL/PLATELET
Abs Immature Granulocytes: 0.52 10*3/uL — ABNORMAL HIGH (ref 0.00–0.07)
Basophils Absolute: 0.1 10*3/uL (ref 0.0–0.1)
Basophils Relative: 0 %
Eosinophils Absolute: 0 10*3/uL (ref 0.0–0.5)
Eosinophils Relative: 0 %
HCT: 50.8 % (ref 39.0–52.0)
Hemoglobin: 16.3 g/dL (ref 13.0–17.0)
Immature Granulocytes: 2 %
Lymphocytes Relative: 3 %
Lymphs Abs: 1 10*3/uL (ref 0.7–4.0)
MCH: 28.2 pg (ref 26.0–34.0)
MCHC: 32.1 g/dL (ref 30.0–36.0)
MCV: 88 fL (ref 80.0–100.0)
Monocytes Absolute: 2.1 10*3/uL — ABNORMAL HIGH (ref 0.1–1.0)
Monocytes Relative: 7 %
Neutro Abs: 25.6 10*3/uL — ABNORMAL HIGH (ref 1.7–7.7)
Neutrophils Relative %: 88 %
Platelets: 323 10*3/uL (ref 150–400)
RBC: 5.77 MIL/uL (ref 4.22–5.81)
RDW: 19.4 % — ABNORMAL HIGH (ref 11.5–15.5)
WBC: 29.3 10*3/uL — ABNORMAL HIGH (ref 4.0–10.5)
nRBC: 0 % (ref 0.0–0.2)

## 2021-01-19 LAB — BASIC METABOLIC PANEL
Anion gap: 8 (ref 5–15)
BUN: 64 mg/dL — ABNORMAL HIGH (ref 6–20)
CO2: 30 mmol/L (ref 22–32)
Calcium: 8.7 mg/dL — ABNORMAL LOW (ref 8.9–10.3)
Chloride: 95 mmol/L — ABNORMAL LOW (ref 98–111)
Creatinine, Ser: 1.46 mg/dL — ABNORMAL HIGH (ref 0.61–1.24)
GFR, Estimated: 55 mL/min — ABNORMAL LOW (ref >60)
Glucose, Bld: 144 mg/dL — ABNORMAL HIGH (ref 70–99)
Potassium: 4.6 mmol/L (ref 3.5–5.1)
Sodium: 133 mmol/L — ABNORMAL LOW (ref 135–145)

## 2021-01-19 LAB — CULTURE, BLOOD (ROUTINE X 2): Special Requests: ADEQUATE

## 2021-01-19 LAB — GLUCOSE, CAPILLARY
Glucose-Capillary: 140 mg/dL — ABNORMAL HIGH (ref 70–99)
Glucose-Capillary: 141 mg/dL — ABNORMAL HIGH (ref 70–99)
Glucose-Capillary: 252 mg/dL — ABNORMAL HIGH (ref 70–99)
Glucose-Capillary: 225 mg/dL — ABNORMAL HIGH (ref 70–99)

## 2021-01-19 SURGERY — RIGHT/LEFT HEART CATH AND CORONARY ANGIOGRAPHY
Anesthesia: LOCAL

## 2021-01-19 MED ORDER — SPIRONOLACTONE 12.5 MG HALF TABLET
12.5000 mg | ORAL_TABLET | Freq: Every day | ORAL | Status: DC
  Administered 2021-01-19 – 2021-01-27 (×9): 12.5 mg via ORAL
  Filled 2021-01-19 (×9): qty 1

## 2021-01-19 MED ORDER — BACLOFEN 5 MG HALF TABLET
5.0000 mg | ORAL_TABLET | Freq: Three times a day (TID) | ORAL | Status: DC | PRN
  Administered 2021-01-19 – 2021-01-20 (×5): 5 mg via ORAL
  Filled 2021-01-19 (×5): qty 1

## 2021-01-19 MED ORDER — PANTOPRAZOLE SODIUM 40 MG PO TBEC
40.0000 mg | DELAYED_RELEASE_TABLET | Freq: Two times a day (BID) | ORAL | Status: DC
  Administered 2021-01-19 – 2021-01-27 (×17): 40 mg via ORAL
  Filled 2021-01-19 (×18): qty 1

## 2021-01-19 MED ORDER — ONDANSETRON HCL 4 MG/2ML IJ SOLN
4.0000 mg | Freq: Once | INTRAMUSCULAR | Status: AC | PRN
  Administered 2021-01-19: 4 mg via INTRAVENOUS
  Filled 2021-01-19: qty 2

## 2021-01-19 MED ORDER — EMPAGLIFLOZIN 10 MG PO TABS
10.0000 mg | ORAL_TABLET | Freq: Every day | ORAL | Status: DC
  Administered 2021-01-19 – 2021-01-27 (×9): 10 mg via ORAL
  Filled 2021-01-19 (×9): qty 1

## 2021-01-19 NOTE — H&P (View-Only) (Signed)
Advanced Heart Failure Rounding Note  PCP-Cardiologist: Dr. Shirlee Latch  Subjective:   Seen by ID yesterday for group G bacteremia. Left foot wound suspected source. TEE on 07/28.  Co-ox 70% this am. CVP 13 with body positioned at angle, head flat in bed (patient declined moving). Off IV lasix. Starting PO torsemide 80 mg BID. Weight stable.   Creatinine continuing to trend down, 2.37 > 1.87 > 1.64 > 1.57.   K 7.5 on am draw, 4.6 on recheck    Objective:   Weight Range: 117.1 kg Body mass index is 30.61 kg/m.   Vital Signs:   Temp:  [97.4 F (36.3 C)-98.7 F (37.1 C)] 98.1 F (36.7 C) (07/27 0416) Pulse Rate:  [83-106] 91 (07/27 0416) Resp:  [16-22] 16 (07/27 0416) BP: (108-155)/(74-103) 118/85 (07/27 0616) SpO2:  [90 %-97 %] 97 % (07/27 0416) FiO2 (%):  [40 %] 40 % (07/27 0738) Weight:  [117.1 kg] 117.1 kg (07/27 0416) Last BM Date: 01/18/21  Weight change: Filed Weights   01/17/21 0616 01/18/21 0548 01/19/21 0416  Weight: 117.5 kg 117.5 kg 117.1 kg    Intake/Output:   Intake/Output Summary (Last 24 hours) at 01/19/2021 0739 Last data filed at 01/19/2021 0421 Gross per 24 hour  Intake 1393 ml  Output 1750 ml  Net -357 ml      Physical Exam  CVP 13 (head flat, legs and torso positioned diagonally across bed) General:  Well appearing, comfortable on BiPAP. No resp difficulty HEENT: Normal Neck: Supple. No JVD. Carotids 2+ bilat; no bruits. No lymphadenopathy or thyromegaly appreciated. Cor: PMI nondisplaced. Regular rate & rhythm. No rubs, gallops or murmurs. Lungs: CTA anteriorly Abdomen: Soft, nontender, nondistended. No hepatosplenomegaly. No bruits or masses. Good bowel sounds. Extremities: No cyanosis, clubbing, rash, edema Neuro: Alert & orientedx3, cranial nerves grossly intact. moves all 4 extremities w/o difficulty. Affect pleasant   Telemetry   Sinus rhythm, 80s-90s. 17 sec run of wide complex rhythm, rate 70s, around 5 am  Labs     CBC Recent Labs    01/18/21 0325 01/19/21 0500  WBC 23.1* 29.3*  NEUTROABS 19.6* 25.6*  HGB 17.1* 16.3  HCT 51.8 50.8  MCV 88.7 88.0  PLT 348 323   Basic Metabolic Panel Recent Labs    76/28/31 0325 01/19/21 0500  NA 135 129*  K 4.5 >7.5*  CL 95* 90*  CO2 29 30  GLUCOSE 178* 253*  BUN 75* 65*  CREATININE 1.64* 1.57*  CALCIUM 8.7* 8.5*   Liver Function Tests Recent Labs    01/18/21 0325 01/19/21 0500  AST 18 26  ALT 19 25  ALKPHOS 63 70  BILITOT 0.9 1.4*  PROT 7.9 8.1  ALBUMIN 2.2* 2.2*   No results for input(s): LIPASE, AMYLASE in the last 72 hours. Cardiac Enzymes No results for input(s): CKTOTAL, CKMB, CKMBINDEX, TROPONINI in the last 72 hours.   BNP: BNP (last 3 results) Recent Labs    10/16/20 1810 11/09/20 1519 01/15/21 1545  BNP 329.7* 373.4* 532.8*    ProBNP (last 3 results) No results for input(s): PROBNP in the last 8760 hours.   D-Dimer No results for input(s): DDIMER in the last 72 hours. Hemoglobin A1C Recent Labs    01/17/21 0538  HGBA1C 10.3*   Fasting Lipid Panel No results for input(s): CHOL, HDL, LDLCALC, TRIG, CHOLHDL, LDLDIRECT in the last 72 hours. Thyroid Function Tests No results for input(s): TSH, T4TOTAL, T3FREE, THYROIDAB in the last 72 hours.  Invalid input(s): FREET3  Other results:   Imaging    No results found.   Medications:     Scheduled Medications:  allopurinol  100 mg Oral Daily   aspirin EC  81 mg Oral Daily   atorvastatin  40 mg Oral Daily   Chlorhexidine Gluconate Cloth  6 each Topical Daily   Chlorhexidine Gluconate Cloth  6 each Topical Q0600   docusate sodium  100 mg Oral BID   feeding supplement (GLUCERNA SHAKE)  237 mL Oral BID BM   heparin  5,000 Units Subcutaneous Q8H   hydrALAZINE  10 mg Oral Q8H   insulin aspart  0-15 Units Subcutaneous TID WC   insulin aspart  0-5 Units Subcutaneous QHS   insulin aspart  4 Units Subcutaneous TID WC   insulin glargine  10 Units  Subcutaneous BID   ipratropium-albuterol  3 mL Nebulization BID   isosorbide mononitrate  30 mg Oral Daily   multivitamin with minerals  1 tablet Oral Daily   mupirocin ointment  1 application Nasal BID   pantoprazole  40 mg Oral QHS   predniSONE  40 mg Oral Q breakfast   Ensure Max Protein  11 oz Oral QHS   senna  1 tablet Oral BID   sodium chloride flush  10-40 mL Intracatheter Q12H   torsemide  80 mg Oral BID    Infusions:  azithromycin 500 mg (01/18/21 2325)   penicillin g continuous IV infusion 12 Million Units (01/18/21 2329)    PRN Medications: acetaminophen **OR** acetaminophen, albuterol, sodium chloride flush   Assessment/Plan   1. Acute on Chronic systolic CHF:   -Primarily nonischemic cardiomyopathy.   -Echo in 2017 with EF 15-20%.  [employer] ICD 2018.  -LHC/RHC 11/06/17 showed volume overload with 80% RCA stenosis.  Cardiac index low at 1.91. The degree of coronary disease does not explain his cardiomyopathy.   -PYP scan in 7/21 not suggestive of transthyretin amyloidosis.  -Admitted 07/23 with acute on chronic CHF in setting of acute on chronic respiratory faliure with hypoxia/possible COPD exacerbation and group G strep bacteremia -Echo with EF 20-25%, anterior, anteroseptal, anterolateral and apicall akinesis (not noted on prior study). Multiple RF for CAD. Eventual repeat RHC/LHC, but need to treat other issues including bacteremia. -Co-ox 70%. -Weight actually down 20 lb from clinic visit in June d/t decreased po intake. Weight here 252 > 259 >259 > 258. CVP 13 (head flat, torso and legs positioned diagonally across bed). Does not appear significantly volume up, but difficult to assess due to positioning (does not want to move in bed). Off IV lasix, starting Torsemide 80 mg po BID. -Creatinine improved, near prior baseline -Continue hydralazine 10 mg TID/Imdur 30 mg daily -Start Jardiance -Add spiro 12.5 mg daily. Monitor K. -Not adherent with medications PTA.  Followed by paramedicine in the community. Has not kept most recent clinic f/u. Did not return calls to reschedule home visits this past month.  -CR to see   2. Acute on chronic hypoxic respiratory failure: -initially requiring BiPAP, now 4-5L 02 [place] -Likely d/t combination of COPD exacerbation and acute on chronic CHF. CCM saw and felt CHF more likely contributing to respiratory failure -Has home 02 but admits he does not use it regularly   3. COPD with possible acute exacerbation:  -Never smoked, but apparently had significant occupational exposure.  It appears that he won a lawsuit dealing with the occupational exposure-related COPD. CPX in 2/21 showed severe functional limitation, but it appeared to be due to pulmonary abnormalities rather  than HF. -Treated for COPD exacerbation per triad   4. Group G strep bacteremia: -Leg wounds suspected source -Seen by ID. On antibiotics. -Has ICD. TEE tomorrow. -Pending imaging of foot to rule out osteomyelitis -ABI ordered yesterday. Reports this was done at [facility]. No report available. Note from 07/21 indicates arterial studies were normal.   5. CAD:   -LHC in 5/19 with 80-90% proximal RCA stenosis treated with DES to RCA.  This did not cause his cardiomyopathy, has a large RCA.  - he denies CP.  HS trop flat -Continue aspirin and statin   6. Polycythemia:  -Likely related to chronic hypoxia.   7. OSA:  -Cannot tolerate CPAP, has 02 to wear at night but not adherent in community   8. DM II, uncontrolled: -A1c 10.3 -He is on insulin.   -Adding Jardiance   9. Hypertriglyceridemia:  -Off Vascepa due to blood in stool that resolved. -Continue fenofibrate 145 mg daily.    10. AKI on CKD:  -Has stage IIIa CKD at baseline -Baseline creatinine 1.5 -Up to 2.5, 2.37 > 1.87 > 1.64 > 1.46    SDOH: -Not adherent with follow-ups or home care visits. Enrolled in Paramedicine in the community. -Reports access to medications and transportation. Not  taking medicines as prescribed PTA. -TOC consult.   Length of Stay: 4  FINCH, LINDSAY N, PA-C  01/19/2021, 7:39 AM  Advanced Heart Failure Team Pager [phone] (M-F; 7a - 5p)  Please contact [facility] for night-coverage after hours (5p -7a ) and weekends on [email or web address]   Patient seen with PA, agree with the above note.   Has hiccups still, baclofen helps.  No dyspnea.    General: NAD Neck: Thick. No JVD, no thyromegaly or thyroid nodule.  Lungs: Clear to auscultation bilaterally with normal respiratory effort. CV: Nondisplaced PMI.  Heart regular S1/S2, no S3/S4, no murmur.  No peripheral edema.    Abdomen: Soft, nontender, no hepatosplenomegaly, no distention.  Skin: Intact without lesions or rashes.  Neurologic: Alert and oriented x 3.  Psych: Normal affect. Extremities: No clubbing or cyanosis.  HEENT: Normal.   Group G strep bacteremia.  Patient has ICD.  Suspect leg wounds may be source.   - ID following.  Needs TEE (schedued for tomorrow, discussed risks/benefits with patient and he agrees to procedure) and imaging of foot for osteomyelitis. - Continue PCN.    CVP about 10.  - Start torsemide 80 mg bid - Continue restart hydralazine at 10 mg tid and Imdur 30. - Restart Jardiance and spironolactone 12.5 today.    Lower extremity wounds, I see no PAD evaluation. - Will order peripheral arterial doppler study.    COPD on home oxygen 2L.    Marca Ancona 01/19/2021 5:13 PM

## 2021-01-19 NOTE — Progress Notes (Signed)
[facility]  Date of Admission:  01/15/2021     Reason for Consult: bacteremia                                  Referring Provider: Sharolyn Douglas     Lines:  7/25-c RUE picc     Abx: 7/23-c azith 7/26-c penicillin iv  7/23-26 ceftriaxone 7/23-26 vanc                                                          Assessment: 60 yo male with dm2, copd on home o2 (2L), nonischemic cardiomyopathy s/p AICD 2018, chronic venous stasis, chronic left heel ulcer, ckd admtited 7/23 with acute (24 hours) myalgia/dyspnea/malaise found to have group g bacteremia/sepsis and heart failure exacerbation and AKI on ckd (baseline cr 1.3-1.5)   7/23 bcx group g strep 1 of 2 set 7/24 bcx negative to date   A picc was placed 7/25 (2 days on appropriate abx and negative repeat bcx)   Group g strep is a beta-hemolytic skin flora strep rather than odontogenic. It is also less likely to cause endocarditis/catheter infection than the viridan streptococcus. And he presented with low burden bacteremia   Patient had chronic left heel unstageable ulcer, which is likely the source of the bacteremia. While we are treating the bacteremia, will need to investigate for deep seated soft tissue/bone infection. He'll benefit from vascular/orthopedics evaluation too as this ulcer is likely non-healing and is source for more bacteremia/pacer seeding complication   Tte so far negative for IE/pacer thrombus. Will need tee. If tee is negative, and the left ankle demonstrate no osteomyelitis/abscess, reasonable to treat for 2 weeks   Suspect this is all group g strep sepsis. PNA dx is confounded but doubt, however reasonable to finish 5 day azithromycin     7/27 assessment Planned tee for 7/28 Await OM w/u for left heel chronic ulcer Sepsis had resolved Chf improving     Plan: Await tee Await xray left knee; if negative would do mri; would want to treat OM of the heel while on iv abx  for other reason Again would benefit from ortho/vascular surgery input regarding chronic open wound left heel being nidus for more bactermia/icd infection Cotinue iv penicillin Finish azith on 7/28 Discussed with primary team  I spent more than 35 minute reviewing data/chart, and coordinating care and >50% direct face to face time providing counseling/discussing diagnostics/treatment plan with patient   Principal Problem:   Acute on chronic respiratory failure with hypoxia and hypercapnia (HCC) Active Problems:   Hypertension   CKD (chronic kidney disease)   Presence of implantable cardioverter-defibrillator (ICD)   GERD (gastroesophageal reflux disease)   Diabetes mellitus with diabetic neuropathy, with long-term current use of insulin (HCC)   COPD exacerbation (HCC)   Chronic systolic CHF (congestive heart failure) (HCC)   Acute renal failure superimposed on stage 3 chronic kidney disease (HCC)   Elevated troponin   Hyponatremia   Bacteremia   Chronic ulcer of left ankle (HCC)   Severe sepsis without septic shock (HCC)   Allergies  Allergen Reactions   Aspirin Itching   Codeine Hives   Coconut Oil Itching  Scheduled Meds:  allopurinol  100 mg Oral Daily   aspirin EC  81 mg Oral Daily   atorvastatin  40 mg Oral Daily   Chlorhexidine Gluconate Cloth  6 each Topical Daily   docusate sodium  100 mg Oral BID   empagliflozin  10 mg Oral Daily   feeding supplement (GLUCERNA SHAKE)  237 mL Oral BID BM   heparin  5,000 Units Subcutaneous Q8H   hydrALAZINE  10 mg Oral Q8H   insulin aspart  0-15 Units Subcutaneous TID WC   insulin aspart  0-5 Units Subcutaneous QHS   insulin aspart  4 Units Subcutaneous TID WC   insulin glargine  10 Units Subcutaneous BID   ipratropium-albuterol  3 mL Nebulization BID   isosorbide mononitrate  30 mg Oral Daily   multivitamin with minerals  1 tablet Oral Daily   mupirocin ointment  1 application Nasal BID   pantoprazole  40 mg Oral BID    predniSONE  40 mg Oral Q breakfast   Ensure Max Protein  11 oz Oral QHS   senna  1 tablet Oral BID   sodium chloride flush  10-40 mL Intracatheter Q12H   spironolactone  12.5 mg Oral Daily   torsemide  80 mg Oral BID   Continuous Infusions:  azithromycin 500 mg (01/18/21 2325)   penicillin g continuous IV infusion 12 Million Units (01/18/21 2329)   PRN Meds:.acetaminophen **OR** acetaminophen, albuterol, baclofen, sodium chloride flush   SUBJECTIVE: No further hiccup No f/c Sob better Tee pending for tomorrow Xray left heel not yet performed  No other complaint  Review of Systems: ROS All other ROS was negative, except mentioned above     OBJECTIVE: Vitals:   01/19/21 0416 01/19/21 0616 01/19/21 0738 01/19/21 1141  BP: 117/85 118/85 118/85 110/76  Pulse: 91  78 80  Resp: 16  (!) 22 16  Temp: 98.1 F (36.7 C)   97.9 F (36.6 C)  TempSrc: Oral   Oral  SpO2: 97%  95% 94%  Weight: 117.1 kg     Height:       Body mass index is 30.61 kg/m.  Physical Exam General/constitutional: no distress, pleasant HEENT: Normocephalic, PER, Conj Clear, EOMI, Oropharynx clear Neck supple CV: rrr no mrg; icd site no tenderness Lungs: clear to auscultation, normal respiratory effort Abd: Soft, Nontender Ext: no edema Skin: bilateral LE in compression ace-wrap Neuro: nonfocal MSK: no peripheral joint swelling/tenderness/warmth; back spines nontender   Central line presence: no   Lab Results Lab Results  Component Value Date   WBC 29.3 (H) 01/19/2021   HGB 16.3 01/19/2021   HCT 50.8 01/19/2021   MCV 88.0 01/19/2021   PLT 323 01/19/2021    Lab Results  Component Value Date   CREATININE 1.46 (H) 01/19/2021   BUN 64 (H) 01/19/2021   NA 133 (L) 01/19/2021   K 4.6 01/19/2021   CL 95 (L) 01/19/2021   CO2 30 01/19/2021    Lab Results  Component Value Date   ALT 25 01/19/2021   AST 26 01/19/2021   ALKPHOS 70 01/19/2021   BILITOT 1.4 (H) 01/19/2021       Microbiology: Recent Results (from the past 240 hour(s))  Culture, blood (routine x 2)     Status: None (Preliminary result)   Collection Time: 01/15/21  3:41 PM   Specimen: BLOOD RIGHT HAND  Result Value Ref Range Status   Specimen Description BLOOD RIGHT HAND  Final   Special Requests   Final  BOTTLES DRAWN AEROBIC AND ANAEROBIC Blood Culture adequate volume   Culture   Final    NO GROWTH 4 DAYS Performed at [facility], [address] [place], [place] [address]    Report Status PENDING  Incomplete  Resp Panel by RT-PCR (Flu A&B, Covid) Nasopharyngeal Swab     Status: None   Collection Time: 01/15/21  3:45 PM   Specimen: Nasopharyngeal Swab; Nasopharyngeal(NP) swabs in vial transport medium  Result Value Ref Range Status   SARS Coronavirus 2 by RT PCR NEGATIVE NEGATIVE Final    Comment: (NOTE) SARS-CoV-2 target nucleic acids are NOT DETECTED.  The SARS-CoV-2 RNA is generally detectable in upper respiratory specimens during the acute phase of infection. The lowest concentration of SARS-CoV-2 viral copies this assay can detect is 138 copies/mL. A negative result does not preclude SARS-Cov-2 infection and should not be used as the sole basis for treatment or other patient management decisions. A negative result may occur with  improper specimen collection/handling, submission of specimen other than nasopharyngeal swab, presence of viral mutation(s) within the areas targeted by this assay, and inadequate number of viral copies(<138 copies/mL). A negative result must be combined with clinical observations, patient history, and epidemiological information. The expected result is Negative.  Fact Sheet for Patients:  BloggerCourse.comhttps://www.fda.gov/media/152166/download  Fact Sheet for Healthcare Providers:  SeriousBroker.ithttps://www.fda.gov/media/152162/download  This test is no t yet approved or cleared by the [place] FDA and  has been authorized for detection and/or diagnosis of  SARS-CoV-2 by FDA under an Emergency Use Authorization (EUA). This EUA will remain  in effect (meaning this test can be used) for the duration of the COVID-19 declaration under Section 564(b)(1) of the Act, 21 U.S.C.section 360bbb-3(b)(1), unless the authorization is terminated  or revoked sooner.       Influenza A by PCR NEGATIVE NEGATIVE Final   Influenza B by PCR NEGATIVE NEGATIVE Final    Comment: (NOTE) The Xpert Xpress SARS-CoV-2/FLU/RSV plus assay is intended as an aid in the diagnosis of influenza from Nasopharyngeal swab specimens and should not be used as a sole basis for treatment. Nasal washings and aspirates are unacceptable for Xpert Xpress SARS-CoV-2/FLU/RSV testing.  Fact Sheet for Patients: BloggerCourse.comhttps://www.fda.gov/media/152166/download  Fact Sheet for Healthcare Providers: SeriousBroker.ithttps://www.fda.gov/media/152162/download  This test is not yet approved or cleared by the [place] FDA and has been authorized for detection and/or diagnosis of SARS-CoV-2 by FDA under an Emergency Use Authorization (EUA). This EUA will remain in effect (meaning this test can be used) for the duration of the COVID-19 declaration under Section 564(b)(1) of the Act, 21 U.S.C. section 360bbb-3(b)(1), unless the authorization is terminated or revoked.  Performed at [facility], [address] [place], [place] [address]   Culture, blood (routine x 2)     Status: Abnormal (Preliminary result)   Collection Time: 01/15/21  6:05 PM   Specimen: BLOOD LEFT FOREARM  Result Value Ref Range Status   Specimen Description BLOOD LEFT FOREARM  Final   Special Requests   Final    BOTTLES DRAWN AEROBIC AND ANAEROBIC Blood Culture adequate volume   Culture  Setup Time   Final    GRAM POSITIVE COCCI IN CHAINS AEROBIC BOTTLE ONLY CRITICAL RESULT CALLED TO, READ BACK BY AND VERIFIED WITH: PHARM D K.HAMMONS ON [id number] AT 0935 BY E.PARRISH Performed at [facility], [address]  [place], [place] [address]    Culture STREPTOCOCCUS GROUP G STAPHYLOCOCCUS HOMINIS  (A)  Final   Report  Status PENDING  Incomplete   Organism ID, Bacteria STREPTOCOCCUS GROUP G  Final      Susceptibility   Streptococcus group g - MIC*    CLINDAMYCIN RESISTANT Resistant     AMPICILLIN <=0.25 SENSITIVE Sensitive     ERYTHROMYCIN 4 RESISTANT Resistant     VANCOMYCIN 0.5 SENSITIVE Sensitive     CEFTRIAXONE <=0.12 SENSITIVE Sensitive     LEVOFLOXACIN 1 SENSITIVE Sensitive     PENICILLIN Value in next row Sensitive      SENSITIVEMIC <=0.06    * STREPTOCOCCUS GROUP G  Blood Culture ID Panel (Reflexed)     Status: Abnormal   Collection Time: 01/15/21  6:05 PM  Result Value Ref Range Status   Enterococcus faecalis NOT DETECTED NOT DETECTED Final   Enterococcus Faecium NOT DETECTED NOT DETECTED Final   Listeria monocytogenes NOT DETECTED NOT DETECTED Final   Staphylococcus species DETECTED (A) NOT DETECTED Final    Comment: CRITICAL RESULT CALLED TO, READ BACK BY AND VERIFIED WITH: PHARM D K.HAMMONS ON [id number] AT 0935 BY E.PARRISH    Staphylococcus aureus (BCID) NOT DETECTED NOT DETECTED Final   Staphylococcus epidermidis NOT DETECTED NOT DETECTED Final   Staphylococcus lugdunensis NOT DETECTED NOT DETECTED Final   Streptococcus species DETECTED (A) NOT DETECTED Final    Comment: Not Enterococcus species, Streptococcus agalactiae, Streptococcus pyogenes, or Streptococcus pneumoniae. CRITICAL RESULT CALLED TO, READ BACK BY AND VERIFIED WITH: PHARM D K.HAMMONS ON [id number] AT 0935 BY E.PARRISH    Streptococcus agalactiae NOT DETECTED NOT DETECTED Final   Streptococcus pneumoniae NOT DETECTED NOT DETECTED Final   Streptococcus pyogenes NOT DETECTED NOT DETECTED Final   A.calcoaceticus-baumannii NOT DETECTED NOT DETECTED Final   Bacteroides fragilis NOT DETECTED NOT DETECTED Final   Enterobacterales NOT DETECTED NOT DETECTED Final   Enterobacter cloacae complex NOT DETECTED NOT DETECTED Final    Escherichia coli NOT DETECTED NOT DETECTED Final   Klebsiella aerogenes NOT DETECTED NOT DETECTED Final   Klebsiella oxytoca NOT DETECTED NOT DETECTED Final   Klebsiella pneumoniae NOT DETECTED NOT DETECTED Final   Proteus species NOT DETECTED NOT DETECTED Final   Salmonella species NOT DETECTED NOT DETECTED Final   Serratia marcescens NOT DETECTED NOT DETECTED Final   Haemophilus influenzae NOT DETECTED NOT DETECTED Final   Neisseria meningitidis NOT DETECTED NOT DETECTED Final   Pseudomonas aeruginosa NOT DETECTED NOT DETECTED Final   Stenotrophomonas maltophilia NOT DETECTED NOT DETECTED Final   Candida albicans NOT DETECTED NOT DETECTED Final   Candida auris NOT DETECTED NOT DETECTED Final   Candida glabrata NOT DETECTED NOT DETECTED Final   Candida krusei NOT DETECTED NOT DETECTED Final   Candida parapsilosis NOT DETECTED NOT DETECTED Final   Candida tropicalis NOT DETECTED NOT DETECTED Final   Cryptococcus neoformans/gattii NOT DETECTED NOT DETECTED Final    Comment: Performed at [facility], [address], [place], [place] [address]  MRSA Next Gen by PCR, Nasal     Status: Abnormal   Collection Time: 01/15/21 10:38 PM   Specimen: Nasal Mucosa; Nasal Swab  Result Value Ref Range Status   MRSA by PCR Next Gen DETECTED (A) NOT DETECTED Final    Comment: RESULT CALLED TO, READ BACK BY AND VERIFIED WITH: RN M.SHULL ON [id number] AT 1121 BY E.PARRISH (NOTE) The GeneXpert MRSA Assay (FDA approved for NASAL specimens only), is one component of a comprehensive MRSA colonization surveillance program. It is not intended to diagnose MRSA infection nor to guide or monitor treatment for MRSA infections.  Test performance is not FDA approved in patients less than 73 years old. Performed at [facility], [address], [place], [place] [address]   Culture, blood (routine x 2)     Status: None (Preliminary result)   Collection Time: 01/16/21 11:38 AM   Specimen: BLOOD   Result Value Ref Range Status   Specimen Description BLOOD RIGHT ANTECUBITAL  Final   Special Requests   Final    BOTTLES DRAWN AEROBIC ONLY Blood Culture adequate volume   Culture   Final    NO GROWTH 3 DAYS Performed at [facility], [address] [place], [place] [address]    Report Status PENDING  Incomplete  Culture, blood (routine x 2)     Status: None (Preliminary result)   Collection Time: 01/16/21 11:39 AM   Specimen: BLOOD RIGHT HAND  Result Value Ref Range Status   Specimen Description BLOOD RIGHT HAND  Final   Special Requests   Final    BOTTLES DRAWN AEROBIC ONLY Blood Culture results may not be optimal due to an inadequate volume of blood received in culture bottles   Culture   Final    NO GROWTH 3 DAYS Performed at [facility], [address] [place], [place] [address]    Report Status PENDING  Incomplete     Serology:   Imaging: If present, new imagings (plain films, ct scans, and mri) have been personally visualized and interpreted; radiology reports have been reviewed. Decision making incorporated into the Impression / Recommendations.  7/23 cxr 1. Cardiomegaly with mild central vascular congestion. 2. No evidence of bowel obstruction.   7/24 tte 1. The anterior,anteroseptal,anterolateral walls are akinetic. The apex  is akinetic. Marland Kitchen Left ventricular ejection fraction, by estimation, is 20 to  25%. The left ventricle has severely decreased function. The left  ventricle demonstrates regional wall  motion abnormalities (see scoring diagram/findings for description). The  left ventricular internal cavity size was moderately to severely dilated.  Left ventricular diastolic parameters are indeterminate.   2. RV poorly visualized. Grossly appears mildly enlarged with low normal  to mildly decreased function. Right ventricular systolic function was not  well visualized. The right ventricular size is not well visualized.  Tricuspid regurgitation  signal is  inadequate for assessing PA pressure.   3. Left atrial size was mildly dilated.   4. The mitral valve is normal in structure. No evidence of mitral valve  regurgitation. No evidence of mitral stenosis.   5. The aortic valve was not well visualized. Aortic valve regurgitation  is not visualized. No aortic stenosis is present   Raymondo Band, MD [facility] [phone] pager    01/19/2021, 12:43 PM

## 2021-01-19 NOTE — Care Management Important Message (Signed)
Important Message  Patient Details  Name: Richard [name] MRN: [id number] Date of Birth: 04-23-1961   Medicare Important Message Given:  Yes     Mahalie Kanner 01/19/2021, 2:09 PM

## 2021-01-19 NOTE — Progress Notes (Signed)
CARDIAC REHAB PHASE I   PRE:  Rate/Rhythm: 80 SR    BP: sitting 96/77    SaO2: 100 4L  MODE:  Ambulation: 200 ft   POST:  Rate/Rhythm: 94 SR    BP: sitting 103/79     SaO2: ? 90 4L  Pt sleepy on arrival but able to stand and slowly walk with RW, 4L. SaO2 does not register well but no major c/o SOB. C/o foot pain. Return to recliner.   [id number]  Harriet Masson CES, ACSM 01/19/2021 3:26 PM

## 2021-01-19 NOTE — Progress Notes (Addendum)
Advanced Heart Failure Rounding Note  PCP-Cardiologist: Dr. Shirlee Latch  Subjective:   Seen by ID yesterday for group G bacteremia. Left foot wound suspected source. TEE on 07/28.  Co-ox 70% this am. CVP 13 with body positioned at angle, head flat in bed (patient declined moving). Off IV lasix. Starting PO torsemide 80 mg BID. Weight stable.   Creatinine continuing to trend down, 2.37 > 1.87 > 1.64 > 1.57.   K 7.5 on am draw, 4.6 on recheck    Objective:   Weight Range: 117.1 kg Body mass index is 30.61 kg/m.   Vital Signs:   Temp:  [97.4 F (36.3 C)-98.7 F (37.1 C)] 98.1 F (36.7 C) (07/27 0416) Pulse Rate:  [83-106] 91 (07/27 0416) Resp:  [16-22] 16 (07/27 0416) BP: (108-155)/(74-103) 118/85 (07/27 0616) SpO2:  [90 %-97 %] 97 % (07/27 0416) FiO2 (%):  [40 %] 40 % (07/27 0738) Weight:  [117.1 kg] 117.1 kg (07/27 0416) Last BM Date: 01/18/21  Weight change: Filed Weights   01/17/21 0616 01/18/21 0548 01/19/21 0416  Weight: 117.5 kg 117.5 kg 117.1 kg    Intake/Output:   Intake/Output Summary (Last 24 hours) at 01/19/2021 0739 Last data filed at 01/19/2021 0421 Gross per 24 hour  Intake 1393 ml  Output 1750 ml  Net -357 ml      Physical Exam  CVP 13 (head flat, legs and torso positioned diagonally across bed) General:  Well appearing, comfortable on BiPAP. No resp difficulty HEENT: Normal Neck: Supple. No JVD. Carotids 2+ bilat; no bruits. No lymphadenopathy or thyromegaly appreciated. Cor: PMI nondisplaced. Regular rate & rhythm. No rubs, gallops or murmurs. Lungs: CTA anteriorly Abdomen: Soft, nontender, nondistended. No hepatosplenomegaly. No bruits or masses. Good bowel sounds. Extremities: No cyanosis, clubbing, rash, edema Neuro: Alert & orientedx3, cranial nerves grossly intact. moves all 4 extremities w/o difficulty. Affect pleasant   Telemetry   Sinus rhythm, 80s-90s. 17 sec run of wide complex rhythm, rate 70s, around 5 am  Labs     CBC Recent Labs    01/18/21 0325 01/19/21 0500  WBC 23.1* 29.3*  NEUTROABS 19.6* 25.6*  HGB 17.1* 16.3  HCT 51.8 50.8  MCV 88.7 88.0  PLT 348 323   Basic Metabolic Panel Recent Labs    76/28/31 0325 01/19/21 0500  NA 135 129*  K 4.5 >7.5*  CL 95* 90*  CO2 29 30  GLUCOSE 178* 253*  BUN 75* 65*  CREATININE 1.64* 1.57*  CALCIUM 8.7* 8.5*   Liver Function Tests Recent Labs    01/18/21 0325 01/19/21 0500  AST 18 26  ALT 19 25  ALKPHOS 63 70  BILITOT 0.9 1.4*  PROT 7.9 8.1  ALBUMIN 2.2* 2.2*   No results for input(s): LIPASE, AMYLASE in the last 72 hours. Cardiac Enzymes No results for input(s): CKTOTAL, CKMB, CKMBINDEX, TROPONINI in the last 72 hours.   BNP: BNP (last 3 results) Recent Labs    10/16/20 1810 11/09/20 1519 01/15/21 1545  BNP 329.7* 373.4* 532.8*    ProBNP (last 3 results) No results for input(s): PROBNP in the last 8760 hours.   D-Dimer No results for input(s): DDIMER in the last 72 hours. Hemoglobin A1C Recent Labs    01/17/21 0538  HGBA1C 10.3*   Fasting Lipid Panel No results for input(s): CHOL, HDL, LDLCALC, TRIG, CHOLHDL, LDLDIRECT in the last 72 hours. Thyroid Function Tests No results for input(s): TSH, T4TOTAL, T3FREE, THYROIDAB in the last 72 hours.  Invalid input(s): FREET3  Other results:   Imaging    No results found.   Medications:     Scheduled Medications:  allopurinol  100 mg Oral Daily   aspirin EC  81 mg Oral Daily   atorvastatin  40 mg Oral Daily   Chlorhexidine Gluconate Cloth  6 each Topical Daily   Chlorhexidine Gluconate Cloth  6 each Topical Q0600   docusate sodium  100 mg Oral BID   feeding supplement (GLUCERNA SHAKE)  237 mL Oral BID BM   heparin  5,000 Units Subcutaneous Q8H   hydrALAZINE  10 mg Oral Q8H   insulin aspart  0-15 Units Subcutaneous TID WC   insulin aspart  0-5 Units Subcutaneous QHS   insulin aspart  4 Units Subcutaneous TID WC   insulin glargine  10 Units  Subcutaneous BID   ipratropium-albuterol  3 mL Nebulization BID   isosorbide mononitrate  30 mg Oral Daily   multivitamin with minerals  1 tablet Oral Daily   mupirocin ointment  1 application Nasal BID   pantoprazole  40 mg Oral QHS   predniSONE  40 mg Oral Q breakfast   Ensure Max Protein  11 oz Oral QHS   senna  1 tablet Oral BID   sodium chloride flush  10-40 mL Intracatheter Q12H   torsemide  80 mg Oral BID    Infusions:  azithromycin 500 mg (01/18/21 2325)   penicillin g continuous IV infusion 12 Million Units (01/18/21 2329)    PRN Medications: acetaminophen **OR** acetaminophen, albuterol, sodium chloride flush   Assessment/Plan   1. Acute on Chronic systolic CHF:   -Primarily nonischemic cardiomyopathy.   -Echo in 2017 with EF 15-20%.  [employer] ICD 2018.  -LHC/RHC 11/06/17 showed volume overload with 80% RCA stenosis.  Cardiac index low at 1.91. The degree of coronary disease does not explain his cardiomyopathy.   -PYP scan in 7/21 not suggestive of transthyretin amyloidosis.  -Admitted 07/23 with acute on chronic CHF in setting of acute on chronic respiratory faliure with hypoxia/possible COPD exacerbation and group G strep bacteremia -Echo with EF 20-25%, anterior, anteroseptal, anterolateral and apicall akinesis (not noted on prior study). Multiple RF for CAD. Eventual repeat RHC/LHC, but need to treat other issues including bacteremia. -Co-ox 70%. -Weight actually down 20 lb from clinic visit in June d/t decreased po intake. Weight here 252 > 259 >259 > 258. CVP 13 (head flat, torso and legs positioned diagonally across bed). Does not appear significantly volume up, but difficult to assess due to positioning (does not want to move in bed). Off IV lasix, starting Torsemide 80 mg po BID. -Creatinine improved, near prior baseline -Continue hydralazine 10 mg TID/Imdur 30 mg daily -Start Jardiance -Add spiro 12.5 mg daily. Monitor K. -Not adherent with medications PTA.  Followed by paramedicine in the community. Has not kept most recent clinic f/u. Did not return calls to reschedule home visits this past month.  -CR to see   2. Acute on chronic hypoxic respiratory failure: -initially requiring BiPAP, now 4-5L 02 [place] -Likely d/t combination of COPD exacerbation and acute on chronic CHF. CCM saw and felt CHF more likely contributing to respiratory failure -Has home 02 but admits he does not use it regularly   3. COPD with possible acute exacerbation:  -Never smoked, but apparently had significant occupational exposure.  It appears that he won a lawsuit dealing with the occupational exposure-related COPD. CPX in 2/21 showed severe functional limitation, but it appeared to be due to pulmonary abnormalities rather  than HF. -Treated for COPD exacerbation per triad   4. Group G strep bacteremia: -Leg wounds suspected source -Seen by ID. On antibiotics. -Has ICD. TEE tomorrow. -Pending imaging of foot to rule out osteomyelitis -ABI ordered yesterday. Reports this was done at [facility]. No report available. Note from 07/21 indicates arterial studies were normal.   5. CAD:   -LHC in 5/19 with 80-90% proximal RCA stenosis treated with DES to RCA.  This did not cause his cardiomyopathy, has a large RCA.  - he denies CP.  HS trop flat -Continue aspirin and statin   6. Polycythemia:  -Likely related to chronic hypoxia.   7. OSA:  -Cannot tolerate CPAP, has 02 to wear at night but not adherent in community   8. DM II, uncontrolled: -A1c 10.3 -He is on insulin.   -Adding Jardiance   9. Hypertriglyceridemia:  -Off Vascepa due to blood in stool that resolved. -Continue fenofibrate 145 mg daily.    10. AKI on CKD:  -Has stage IIIa CKD at baseline -Baseline creatinine 1.5 -Up to 2.5, 2.37 > 1.87 > 1.64 > 1.46    SDOH: -Not adherent with follow-ups or home care visits. Enrolled in Paramedicine in the community. -Reports access to medications and transportation. Not  taking medicines as prescribed PTA. -TOC consult.   Length of Stay: 4  FINCH, LINDSAY N, PA-C  01/19/2021, 7:39 AM  Advanced Heart Failure Team Pager [phone] (M-F; 7a - 5p)  Please contact [facility] for night-coverage after hours (5p -7a ) and weekends on [email or web address]   Patient seen with PA, agree with the above note.   Has hiccups still, baclofen helps.  No dyspnea.    General: NAD Neck: Thick. No JVD, no thyromegaly or thyroid nodule.  Lungs: Clear to auscultation bilaterally with normal respiratory effort. CV: Nondisplaced PMI.  Heart regular S1/S2, no S3/S4, no murmur.  No peripheral edema.    Abdomen: Soft, nontender, no hepatosplenomegaly, no distention.  Skin: Intact without lesions or rashes.  Neurologic: Alert and oriented x 3.  Psych: Normal affect. Extremities: No clubbing or cyanosis.  HEENT: Normal.   Group G strep bacteremia.  Patient has ICD.  Suspect leg wounds may be source.   - ID following.  Needs TEE (schedued for tomorrow, discussed risks/benefits with patient and he agrees to procedure) and imaging of foot for osteomyelitis. - Continue PCN.    CVP about 10.  - Start torsemide 80 mg bid - Continue restart hydralazine at 10 mg tid and Imdur 30. - Restart Jardiance and spironolactone 12.5 today.    Lower extremity wounds, I see no PAD evaluation. - Will order peripheral arterial doppler study.    COPD on home oxygen 2L.    Marca Ancona 01/19/2021 5:13 PM

## 2021-01-19 NOTE — Plan of Care (Signed)

## 2021-01-19 NOTE — Progress Notes (Signed)
Occupational Therapy Treatment Patient Details Name: Richard [name] MRN: [id number] DOB: [birth year] Today's Date: 01/19/2021    History of present illness Pt is a 60 y.o. male admitted 01/15/21 with SOB, body aches. Workup for CHF vs COPD exacerbation. Pt also with chronic BLE wounds followed by wound care. Of note, recent admission 09/2020 with CHF exacerbation. PMH includes DM2, COPD (on 2L O2 baseline), CHF, AICD, CKD, HTN.   OT comments  Patient needing verbal cues for safety during treatment for safe walker management and navigating in room with walker/manage O2 tubing. Patient with x1 mild loss of balance needing light min A standing at sink side while brushing teeth, otherwise able to perform g/h tasks at supervision level in standing. Patient had bout of hiccups requiring prolonged seated rest break at sink, "I need cold water" to try and subside hiccups/discomfort. Patient min G to ambulate with walker over to recliner chair, O2 remain stable on 5L.    Follow Up Recommendations  No OT follow up;Supervision - Intermittent    Equipment Recommendations  3 in 1 bedside commode;Other (comment) (rolling walker)       Precautions / Restrictions Precautions Precautions: Fall;Other (comment) Precaution Comments: monitor vitals       Mobility Bed Mobility Overal bed mobility: Modified Independent                  Transfers Overall transfer level: Needs assistance Equipment used: Rolling walker (2 wheeled) Transfers: Sit to/from Stand Sit to Stand: Min guard         General transfer comment: cues for sequencing to safely transfer to recliner chair for O2 management    Balance Overall balance assessment: Needs assistance Sitting-balance support: Feet supported Sitting balance-Leahy Scale: Good     Standing balance support: Single extremity supported;During functional activity;Bilateral upper extremity supported Standing balance-Leahy Scale: Fair Standing balance  comment: can static stand without UE support                           ADL either performed or assessed with clinical judgement   ADL Overall ADL's : Needs assistance/impaired     Grooming: Oral care;Wash/dry face;Wash/dry hands;Min guard;Standing Grooming Details (indicate cue type and reason): x1 posterior loss of balance needing light min A for safety, otherwise able to maintain balance with intermittent unilateral upper extremity support                 Toilet Transfer: Min guard;Cueing for safety;Ambulation;RW Toilet Transfer Details (indicate cue type and reason): first to sink then over to recliner chair, min G for safety navigating in room with rolling walker. needs cues to sequence sitting into chair as to not sit on O2 tubing/lines         Functional mobility during ADLs: Min guard;Rolling walker;Cueing for safety        Cognition Arousal/Alertness: Awake/alert Behavior During Therapy: WFL for tasks assessed/performed Overall Cognitive Status: No family/caregiver present to determine baseline cognitive functioning                           Safety/Judgement: Decreased awareness of safety     General Comments: requires cues for safety using walker and during functional transfers              General Comments VSS 5L    Pertinent Vitals/ Pain       Pain Assessment: Faces Faces Pain Scale: Hurts little more Pain  Location: L foot, abdomen (from hiccups) Pain Descriptors / Indicators: Discomfort Pain Intervention(s): Monitored during session         Frequency  Min 2X/week        Progress Toward Goals  OT Goals(current goals can now be found in the care plan section)  Progress towards OT goals: Progressing toward goals  Acute Rehab OT Goals Patient Stated Goal: to get better OT Goal Formulation: With patient Time For Goal Achievement: 01/31/21 Potential to Achieve Goals: Good ADL Goals Pt Will Perform Grooming: with  modified independence;standing Pt Will Perform Lower Body Dressing: with modified independence;sitting/lateral leans;sit to/from stand Pt Will Transfer to Toilet: with modified independence;ambulating Additional ADL Goal #1: Pt to complete 3 step trail making task with min verbal cues  Plan Discharge plan remains appropriate       AM-PAC OT "6 Clicks" Daily Activity     Outcome Measure   Help from another person eating meals?: None Help from another person taking care of personal grooming?: A Little Help from another person toileting, which includes using toliet, bedpan, or urinal?: A Little Help from another person bathing (including washing, rinsing, drying)?: A Little Help from another person to put on and taking off regular upper body clothing?: A Little Help from another person to put on and taking off regular lower body clothing?: A Little 6 Click Score: 19    End of Session Equipment Utilized During Treatment: Rolling walker;Oxygen  OT Visit Diagnosis: Unsteadiness on feet (R26.81);Other abnormalities of gait and mobility (R26.89)   Activity Tolerance Patient tolerated treatment well   Patient Left in chair;with call bell/phone within reach;with chair alarm set   Nurse Communication Mobility status        Time: 7416-3845 OT Time Calculation (min): 39 min  Charges: OT General Charges $OT Visit: 1 Visit OT Treatments $Self Care/Home Management : 38-52 mins  Marlyce Huge OT OT pager: [phone]   Carmelia Roller 01/19/2021, 1:55 PM

## 2021-01-19 NOTE — Progress Notes (Signed)
PROGRESS NOTE    Richard [name]  ZOX:[id number] DOB: [birth year] DOA: 01/15/2021 PCP: Ananias Pilgrim, MD   Brief Narrative: 60 year old with past medical history significant for diabetes type 2, COPD on chronic oxygen 2 L, systolic heart failure status post AICD, venous stasis ulcer, hypertension, CKD, pulmonary nodule Richard [name] patient with increased shortness of breath, no fever no chest pain.  Noted to have oxygen saturation 87 on room air with EMS.  4 L [place] applied by EMS and gave 1 nebulizer, Solu-Medrol.  CCM was consulted for worsening hypoxia and patient being placed on BiPAP.  Patient was admitted for further management.     Assessment & Plan:   Principal Problem:   Acute on chronic respiratory failure with hypoxia and hypercapnia (HCC) Active Problems:   Hypertension   CKD (chronic kidney disease)   Presence of implantable cardioverter-defibrillator (ICD)   GERD (gastroesophageal reflux disease)   Diabetes mellitus with diabetic neuropathy, with long-term current use of insulin (HCC)   COPD exacerbation (HCC)   Chronic systolic CHF (congestive heart failure) (HCC)   Acute renal failure superimposed on stage 3 chronic kidney disease (HCC)   Elevated troponin   Hyponatremia   Bacteremia   Chronic ulcer of left ankle (HCC)   Severe sepsis without septic shock (HCC)  1-Acute on Chronic Hypoxic Respiratory Failure, acute on chronic combined heart failure, AICD -Patient required BiPAP on admission.  Currently on nasal cannula oxygen for mentation Elevation of BNP and flat troponin.  Chest x-ray with mild vascular congestion. -Echo showed ejection fraction 20 to 25% -Neurology consulted appreciate recommendation. -Continue with hydralazine, Imdur and torsemide, spironolactone .  -Bisoprolol, digoxin and Entresto on hold  Severe sepsis likely secondary to group G bacteremia Likely secondary to chronic left heel unstageable ulcer.  -Patient tachycardic, tachypneic L a more  than 2 leukocytosis -Cultures x2 gross strep group G, Staphylococcus  -Repeated blood culture 7/24: No growth to date -ID Following and recommend TEE -Might need orthopedic or vascular consult -Asked nurse to try to get recent ABI results -Awaiting x-ray to consult Ortho -Continue with IV penicillin G  Possible COPD exacerbation Azithromycin, On prednisone.  continue with  nebulizer treatments  Diabetes type 2: Continue with Lantus and sliding scale insulin A1c 10  AKI on CKD stage III: Baseline creatinine 1.5  CAD/HLD Status post PCI With aspirin and statin  Chronic venous stasis ulcer: Status postdebridement of left foot wound on 01/13/2021 at OSH Wound care following.  Hiccup:  Started baclofen, change PPI to twice daily   Nutrition Problem: Increased nutrient needs Etiology: wound healing    Signs/Symptoms: estimated needs    Interventions: Glucerna shake, MVI, Premier Protein  Estimated body mass index is 30.61 kg/m as calculated from the following:   Height as of this encounter: 6\' 5"  (1.956 m).   Weight as of this encounter: 117.1 kg.   DVT prophylaxis: Heparin Code Status: Full code Family Communication: Care discussed with patient Disposition Plan:  Status is: Inpatient  Remains inpatient appropriate because:IV treatments appropriate due to intensity of illness or inability to take PO  Dispo: The patient is from: Home              Anticipated d/c is to: Home              Patient currently is not medically stable to d/c.   Difficult to place patient No        Consultants:  Cardiology ID  Procedures:    Antimicrobials:  Subjective: He is complaining that he  Objective: Vitals:   01/19/21 0008 01/19/21 0416 01/19/21 0616 01/19/21 0738  BP:  117/85 118/85 118/85  Pulse: (!) 106 91  78  Resp:  16  (!) 22  Temp:  98.1 F (36.7 C)    TempSrc:  Oral    SpO2: 94% 97%  95%  Weight:  117.1 kg    Height:        Intake/Output  Summary (Last 24 hours) at 01/19/2021 0744 Last data filed at 01/19/2021 0421 Gross per 24 hour  Intake 1393 ml  Output 1750 ml  Net -357 ml   Filed Weights   01/17/21 0616 01/18/21 0548 01/19/21 0416  Weight: 117.5 kg 117.5 kg 117.1 kg    Examination:  General exam: Appears calm and comfortable  Respiratory system: Bilateral crackles Cardiovascular system: S1 & S2 heard, RRR. No JVD, murmurs, rubs, gallops or clicks. No pedal edema. Gastrointestinal system: Abdomen is nondistended, soft and nontender. No organomegaly or masses felt. Normal bowel sounds heard. Central nervous system: Alert and oriented.  Extremities: Lower extremity with Unna boot dressing  Data Reviewed: I have personally reviewed following labs and imaging studies  CBC: Recent Labs  Lab 01/15/21 1545 01/15/21 1716 01/15/21 2045 01/16/21 0323 01/17/21 0538 01/18/21 0325 01/19/21 0500  WBC 22.9*  --   --  24.8* 26.8* 23.1* 29.3*  NEUTROABS 18.9*  --   --  22.5* 23.9* 19.6* 25.6*  HGB 17.8*   < > 19.0* 16.5 17.0 17.1* 16.3  HCT 53.8*   < > 56.0* 51.3 53.5* 51.8 50.8  MCV 87.8  --   --  89.2 88.6 88.7 88.0  PLT 276  --   --  295 346 348 323   < > = values in this interval not displayed.   Basic Metabolic Panel: Recent Labs  Lab 01/15/21 2307 01/16/21 0323 01/17/21 0538 01/18/21 0325 01/19/21 0500 01/19/21 0634  NA  --  135 135 135 129* 133*  K  --  5.2* 4.9 4.5 >7.5* 4.6  CL  --  93* 97* 95* 90* 95*  CO2  --  27 28 29 30 30   GLUCOSE  --  454* 248* 178* 253* 144*  BUN  --  77* 82* 75* 65* 64*  CREATININE  --  2.37* 1.87* 1.64* 1.57* 1.46*  CALCIUM  --  8.9 8.7* 8.7* 8.5* 8.7*  MG 2.5* 2.7*  --   --   --   --   PHOS 6.2* 6.9*  --   --   --   --    GFR: Estimated Creatinine Clearance: 78.2 mL/min (A) (by C-G formula based on SCr of 1.46 mg/dL (H)). Liver Function Tests: Recent Labs  Lab 01/15/21 1545 01/16/21 0323 01/17/21 0538 01/18/21 0325 01/19/21 0500  AST 23 18 20 18 26   ALT 22 20  22 19 25   ALKPHOS 73 78 71 63 70  BILITOT 1.7* 1.2 0.7 0.9 1.4*  PROT 8.1 7.6 7.4 7.9 8.1  ALBUMIN 2.5* 2.1* 2.0* 2.2* 2.2*   No results for input(s): LIPASE, AMYLASE in the last 168 hours. No results for input(s): AMMONIA in the last 168 hours. Coagulation Profile: No results for input(s): INR, PROTIME in the last 168 hours. Cardiac Enzymes: Recent Labs  Lab 01/15/21 2307  CKTOTAL 101   BNP (last 3 results) No results for input(s): PROBNP in the last 8760 hours. HbA1C: Recent Labs    01/17/21 0538  HGBA1C 10.3*   CBG: Recent  Labs  Lab 01/18/21 0735 01/18/21 1126 01/18/21 1639 01/18/21 2111 01/19/21 0556  GLUCAP 201* 184* 194* 261* 140*   Lipid Profile: No results for input(s): CHOL, HDL, LDLCALC, TRIG, CHOLHDL, LDLDIRECT in the last 72 hours. Thyroid Function Tests: No results for input(s): TSH, T4TOTAL, FREET4, T3FREE, THYROIDAB in the last 72 hours. Anemia Panel: No results for input(s): VITAMINB12, FOLATE, FERRITIN, TIBC, IRON, RETICCTPCT in the last 72 hours. Sepsis Labs: Recent Labs  Lab 01/15/21 1805 01/15/21 2307 01/16/21 0323 01/17/21 0538 01/18/21 0325  PROCALCITON  --  12.40  --  6.72 4.37  LATICACIDVEN 2.1* 1.8 2.1* 1.8  --     Recent Results (from the past 240 hour(s))  Culture, blood (routine x 2)     Status: None (Preliminary result)   Collection Time: 01/15/21  3:41 PM   Specimen: BLOOD RIGHT HAND  Result Value Ref Range Status   Specimen Description BLOOD RIGHT HAND  Final   Special Requests   Final    BOTTLES DRAWN AEROBIC AND ANAEROBIC Blood Culture adequate volume   Culture   Final    NO GROWTH 3 DAYS Performed at [facility], [address] [place], [place] [address]    Report Status PENDING  Incomplete  Resp Panel by RT-PCR (Flu A&B, Covid) Nasopharyngeal Swab     Status: None   Collection Time: 01/15/21  3:45 PM   Specimen: Nasopharyngeal Swab; Nasopharyngeal(NP) swabs in vial transport medium  Result Value Ref Range  Status   SARS Coronavirus 2 by RT PCR NEGATIVE NEGATIVE Final    Comment: (NOTE) SARS-CoV-2 target nucleic acids are NOT DETECTED.  The SARS-CoV-2 RNA is generally detectable in upper respiratory specimens during the acute phase of infection. The lowest concentration of SARS-CoV-2 viral copies this assay can detect is 138 copies/mL. A negative result does not preclude SARS-Cov-2 infection and should not be used as the sole basis for treatment or other patient management decisions. A negative result may occur with  improper specimen collection/handling, submission of specimen other than nasopharyngeal swab, presence of viral mutation(s) within the areas targeted by this assay, and inadequate number of viral copies(<138 copies/mL). A negative result must be combined with clinical observations, patient history, and epidemiological information. The expected result is Negative.  Fact Sheet for Patients:  BloggerCourse.com  Fact Sheet for Healthcare Providers:  SeriousBroker.it  This test is no t yet approved or cleared by the [place] FDA and  has been authorized for detection and/or diagnosis of SARS-CoV-2 by FDA under an Emergency Use Authorization (EUA). This EUA will remain  in effect (meaning this test can be used) for the duration of the COVID-19 declaration under Section 564(b)(1) of the Act, 21 U.S.C.section 360bbb-3(b)(1), unless the authorization is terminated  or revoked sooner.       Influenza A by PCR NEGATIVE NEGATIVE Final   Influenza B by PCR NEGATIVE NEGATIVE Final    Comment: (NOTE) The Xpert Xpress SARS-CoV-2/FLU/RSV plus assay is intended as an aid in the diagnosis of influenza from Nasopharyngeal swab specimens and should not be used as a sole basis for treatment. Nasal washings and aspirates are unacceptable for Xpert Xpress SARS-CoV-2/FLU/RSV testing.  Fact Sheet for  Patients: BloggerCourse.com  Fact Sheet for Healthcare Providers: SeriousBroker.it  This test is not yet approved or cleared by the [place] FDA and has been authorized for detection and/or diagnosis of SARS-CoV-2 by FDA under an Emergency Use Authorization (EUA). This EUA will remain in effect (meaning this test  can be used) for the duration of the COVID-19 declaration under Section 564(b)(1) of the Act, 21 U.S.C. section 360bbb-3(b)(1), unless the authorization is terminated or revoked.  Performed at [facility], [address] [place], [place] [address]   Culture, blood (routine x 2)     Status: Abnormal (Preliminary result)   Collection Time: 01/15/21  6:05 PM   Specimen: BLOOD LEFT FOREARM  Result Value Ref Range Status   Specimen Description BLOOD LEFT FOREARM  Final   Special Requests   Final    BOTTLES DRAWN AEROBIC AND ANAEROBIC Blood Culture adequate volume   Culture  Setup Time   Final    GRAM POSITIVE COCCI IN CHAINS AEROBIC BOTTLE ONLY CRITICAL RESULT CALLED TO, READ BACK BY AND VERIFIED WITH: PHARM D K.HAMMONS ON [id number] AT 0935 BY E.PARRISH    Culture STREPTOCOCCUS GROUP G (A)  Final   Report Status PENDING  Incomplete   Organism ID, Bacteria STREPTOCOCCUS GROUP G  Final      Susceptibility   Streptococcus group g - MIC*    CLINDAMYCIN RESISTANT Resistant     AMPICILLIN <=0.25 SENSITIVE Sensitive     ERYTHROMYCIN 4 RESISTANT Resistant     VANCOMYCIN 0.5 SENSITIVE Sensitive     CEFTRIAXONE <=0.12 SENSITIVE Sensitive     LEVOFLOXACIN 1 SENSITIVE Sensitive     PENICILLIN Value in next row Sensitive      SENSITIVEMIC <=0.06Performed at [facility], [address] [place], [place] [address]    * STREPTOCOCCUS GROUP G  Blood Culture ID Panel (Reflexed)     Status: Abnormal   Collection Time: 01/15/21  6:05 PM  Result Value Ref Range Status   Enterococcus faecalis NOT DETECTED NOT DETECTED  Final   Enterococcus Faecium NOT DETECTED NOT DETECTED Final   Listeria monocytogenes NOT DETECTED NOT DETECTED Final   Staphylococcus species DETECTED (A) NOT DETECTED Final    Comment: CRITICAL RESULT CALLED TO, READ BACK BY AND VERIFIED WITH: PHARM D K.HAMMONS ON [id number] AT 0935 BY E.PARRISH    Staphylococcus aureus (BCID) NOT DETECTED NOT DETECTED Final   Staphylococcus epidermidis NOT DETECTED NOT DETECTED Final   Staphylococcus lugdunensis NOT DETECTED NOT DETECTED Final   Streptococcus species DETECTED (A) NOT DETECTED Final    Comment: Not Enterococcus species, Streptococcus agalactiae, Streptococcus pyogenes, or Streptococcus pneumoniae. CRITICAL RESULT CALLED TO, READ BACK BY AND VERIFIED WITH: PHARM D K.HAMMONS ON [id number] AT 0935 BY E.PARRISH    Streptococcus agalactiae NOT DETECTED NOT DETECTED Final   Streptococcus pneumoniae NOT DETECTED NOT DETECTED Final   Streptococcus pyogenes NOT DETECTED NOT DETECTED Final   A.calcoaceticus-baumannii NOT DETECTED NOT DETECTED Final   Bacteroides fragilis NOT DETECTED NOT DETECTED Final   Enterobacterales NOT DETECTED NOT DETECTED Final   Enterobacter cloacae complex NOT DETECTED NOT DETECTED Final   Escherichia coli NOT DETECTED NOT DETECTED Final   Klebsiella aerogenes NOT DETECTED NOT DETECTED Final   Klebsiella oxytoca NOT DETECTED NOT DETECTED Final   Klebsiella pneumoniae NOT DETECTED NOT DETECTED Final   Proteus species NOT DETECTED NOT DETECTED Final   Salmonella species NOT DETECTED NOT DETECTED Final   Serratia marcescens NOT DETECTED NOT DETECTED Final   Haemophilus influenzae NOT DETECTED NOT DETECTED Final   Neisseria meningitidis NOT DETECTED NOT DETECTED Final   Pseudomonas aeruginosa NOT DETECTED NOT DETECTED Final   Stenotrophomonas maltophilia NOT DETECTED NOT DETECTED Final   Candida albicans NOT DETECTED NOT DETECTED Final   Candida auris NOT DETECTED NOT  DETECTED Final   Candida glabrata NOT DETECTED NOT  DETECTED Final   Candida krusei NOT DETECTED NOT DETECTED Final   Candida parapsilosis NOT DETECTED NOT DETECTED Final   Candida tropicalis NOT DETECTED NOT DETECTED Final   Cryptococcus neoformans/gattii NOT DETECTED NOT DETECTED Final    Comment: Performed at [facility], [address], [place], [place] [address]  MRSA Next Gen by PCR, Nasal     Status: Abnormal   Collection Time: 01/15/21 10:38 PM   Specimen: Nasal Mucosa; Nasal Swab  Result Value Ref Range Status   MRSA by PCR Next Gen DETECTED (A) NOT DETECTED Final    Comment: RESULT CALLED TO, READ BACK BY AND VERIFIED WITH: RN M.SHULL ON [id number] AT 1121 BY E.PARRISH (NOTE) The GeneXpert MRSA Assay (FDA approved for NASAL specimens only), is one component of a comprehensive MRSA colonization surveillance program. It is not intended to diagnose MRSA infection nor to guide or monitor treatment for MRSA infections. Test performance is not FDA approved in patients less than 38 years old. Performed at [facility], [address], [place], [place] [address]   Culture, blood (routine x 2)     Status: None (Preliminary result)   Collection Time: 01/16/21 11:38 AM   Specimen: BLOOD  Result Value Ref Range Status   Specimen Description BLOOD RIGHT ANTECUBITAL  Final   Special Requests   Final    BOTTLES DRAWN AEROBIC ONLY Blood Culture adequate volume   Culture   Final    NO GROWTH 2 DAYS Performed at [facility], [address], [place], [place] [address]    Report Status PENDING  Incomplete  Culture, blood (routine x 2)     Status: None (Preliminary result)   Collection Time: 01/16/21 11:39 AM   Specimen: BLOOD RIGHT HAND  Result Value Ref Range Status   Specimen Description BLOOD RIGHT HAND  Final   Special Requests   Final    BOTTLES DRAWN AEROBIC ONLY Blood Culture results may not be optimal due to an inadequate volume of blood received in culture bottles   Culture   Final    NO GROWTH 2  DAYS Performed at [facility], [address] [place], [place] [address]    Report Status PENDING  Incomplete         Radiology Studies: [place] EKG SITE RITE  Result Date: 01/17/2021 If Site Rite image not attached, placement could not be confirmed due to current cardiac rhythm.       Scheduled Meds:  allopurinol  100 mg Oral Daily   aspirin EC  81 mg Oral Daily   atorvastatin  40 mg Oral Daily   Chlorhexidine Gluconate Cloth  6 each Topical Daily   Chlorhexidine Gluconate Cloth  6 each Topical Q0600   docusate sodium  100 mg Oral BID   feeding supplement (GLUCERNA SHAKE)  237 mL Oral BID BM   heparin  5,000 Units Subcutaneous Q8H   hydrALAZINE  10 mg Oral Q8H   insulin aspart  0-15 Units Subcutaneous TID WC   insulin aspart  0-5 Units Subcutaneous QHS   insulin aspart  4 Units Subcutaneous TID WC   insulin glargine  10 Units Subcutaneous BID   ipratropium-albuterol  3 mL Nebulization BID   isosorbide mononitrate  30 mg Oral Daily   multivitamin with minerals  1 tablet Oral Daily   mupirocin ointment  1 application Nasal BID   pantoprazole  40 mg  Oral QHS   predniSONE  40 mg Oral Q breakfast   Ensure Max Protein  11 oz Oral QHS   senna  1 tablet Oral BID   sodium chloride flush  10-40 mL Intracatheter Q12H   torsemide  80 mg Oral BID   Continuous Infusions:  azithromycin 500 mg (01/18/21 2325)   penicillin g continuous IV infusion 12 Million Units (01/18/21 2329)     LOS: 4 days    Time spent: 35 minutes.     Alba CoryBelkys A Yitzchok Carriger, MD [facility]   If 7PM-7AM, please contact night-coverage [email or web address]  01/19/2021, 7:44 AM

## 2021-01-19 NOTE — Progress Notes (Signed)
PT Cancellation Note  Patient Details Name: Richard [name] MRN: [id number] DOB: 08/19/1960   Cancelled Treatment:    Reason Eval/Treat Not Completed: Patient on BiPAP this AM; will follow-up for treatment later today as schedule permits.  Ina Homes, PT, DPT Acute Rehabilitation Services  Pager [phone] Office [phone]  Malachy Chamber 01/19/2021, 9:07 AM

## 2021-01-19 NOTE — Progress Notes (Signed)
RN attempted to call wife in order to ask where the was the pt's ankle xray done. Wife not available.

## 2021-01-20 ENCOUNTER — Inpatient Hospital Stay (HOSPITAL_COMMUNITY): Payer: Medicare Other | Admitting: Anesthesiology

## 2021-01-20 ENCOUNTER — Inpatient Hospital Stay (HOSPITAL_COMMUNITY): Payer: Medicare Other

## 2021-01-20 ENCOUNTER — Encounter (HOSPITAL_COMMUNITY)
Admission: EM | Disposition: A | Discharge status: Rehabilitation Facility | Payer: Self-pay | Source: Home / Self Care | Attending: Internal Medicine

## 2021-01-20 ENCOUNTER — Encounter (HOSPITAL_COMMUNITY): Payer: Self-pay | Admitting: Internal Medicine

## 2021-01-20 DIAGNOSIS — R0602 Shortness of breath: Secondary | ICD-10-CM | POA: Diagnosis not present

## 2021-01-20 DIAGNOSIS — N179 Acute kidney failure, unspecified: Secondary | ICD-10-CM | POA: Diagnosis not present

## 2021-01-20 DIAGNOSIS — L97329 Non-pressure chronic ulcer of left ankle with unspecified severity: Secondary | ICD-10-CM | POA: Diagnosis not present

## 2021-01-20 DIAGNOSIS — N1831 Chronic kidney disease, stage 3a: Secondary | ICD-10-CM

## 2021-01-20 DIAGNOSIS — E1152 Type 2 diabetes mellitus with diabetic peripheral angiopathy with gangrene: Secondary | ICD-10-CM | POA: Diagnosis not present

## 2021-01-20 DIAGNOSIS — J9621 Acute and chronic respiratory failure with hypoxia: Secondary | ICD-10-CM | POA: Diagnosis not present

## 2021-01-20 DIAGNOSIS — J9622 Acute and chronic respiratory failure with hypercapnia: Secondary | ICD-10-CM | POA: Diagnosis not present

## 2021-01-20 DIAGNOSIS — R7881 Bacteremia: Secondary | ICD-10-CM | POA: Diagnosis not present

## 2021-01-20 DIAGNOSIS — I5022 Chronic systolic (congestive) heart failure: Secondary | ICD-10-CM | POA: Diagnosis not present

## 2021-01-20 HISTORY — PX: TEE WITHOUT CARDIOVERSION: SHX5443

## 2021-01-20 LAB — CBC WITH DIFFERENTIAL/PLATELET
Abs Immature Granulocytes: 0.73 10*3/uL — ABNORMAL HIGH (ref 0.00–0.07)
Basophils Absolute: 0.1 10*3/uL (ref 0.0–0.1)
Basophils Relative: 0 %
Eosinophils Absolute: 0 10*3/uL (ref 0.0–0.5)
Eosinophils Relative: 0 %
HCT: 49.1 % (ref 39.0–52.0)
Hemoglobin: 16.1 g/dL (ref 13.0–17.0)
Immature Granulocytes: 2 %
Lymphocytes Relative: 4 %
Lymphs Abs: 1.3 10*3/uL (ref 0.7–4.0)
MCH: 28.8 pg (ref 26.0–34.0)
MCHC: 32.8 g/dL (ref 30.0–36.0)
MCV: 87.7 fL (ref 80.0–100.0)
Monocytes Absolute: 2 10*3/uL — ABNORMAL HIGH (ref 0.1–1.0)
Monocytes Relative: 6 %
Neutro Abs: 29.9 10*3/uL — ABNORMAL HIGH (ref 1.7–7.7)
Neutrophils Relative %: 88 %
Platelets: 255 10*3/uL (ref 150–400)
RBC: 5.6 MIL/uL (ref 4.22–5.81)
RDW: 19.4 % — ABNORMAL HIGH (ref 11.5–15.5)
WBC: 34.1 10*3/uL — ABNORMAL HIGH (ref 4.0–10.5)
nRBC: 0 % (ref 0.0–0.2)

## 2021-01-20 LAB — CULTURE, BLOOD (ROUTINE X 2)
Culture: NO GROWTH
Special Requests: ADEQUATE

## 2021-01-20 LAB — COOXEMETRY PANEL
Carboxyhemoglobin: 1.4 % (ref 0.5–1.5)
Methemoglobin: 1 % (ref 0.0–1.5)
O2 Saturation: 78.2 %
Total hemoglobin: 16.5 g/dL — ABNORMAL HIGH (ref 12.0–16.0)

## 2021-01-20 LAB — GLUCOSE, CAPILLARY
Glucose-Capillary: 180 mg/dL — ABNORMAL HIGH (ref 70–99)
Glucose-Capillary: 137 mg/dL — ABNORMAL HIGH (ref 70–99)
Glucose-Capillary: 190 mg/dL — ABNORMAL HIGH (ref 70–99)
Glucose-Capillary: 278 mg/dL — ABNORMAL HIGH (ref 70–99)
Glucose-Capillary: 317 mg/dL — ABNORMAL HIGH (ref 70–99)

## 2021-01-20 LAB — COMPREHENSIVE METABOLIC PANEL
ALT: 22 U/L (ref 0–44)
AST: 21 U/L (ref 15–41)
Albumin: 2.1 g/dL — ABNORMAL LOW (ref 3.5–5.0)
Alkaline Phosphatase: 65 U/L (ref 38–126)
Anion gap: 12 (ref 5–15)
BUN: 63 mg/dL — ABNORMAL HIGH (ref 6–20)
CO2: 30 mmol/L (ref 22–32)
Calcium: 8.7 mg/dL — ABNORMAL LOW (ref 8.9–10.3)
Chloride: 89 mmol/L — ABNORMAL LOW (ref 98–111)
Creatinine, Ser: 1.75 mg/dL — ABNORMAL HIGH (ref 0.61–1.24)
GFR, Estimated: 45 mL/min — ABNORMAL LOW (ref >60)
Glucose, Bld: 181 mg/dL — ABNORMAL HIGH (ref 70–99)
Potassium: 4.5 mmol/L (ref 3.5–5.1)
Sodium: 131 mmol/L — ABNORMAL LOW (ref 135–145)
Total Bilirubin: 1.4 mg/dL — ABNORMAL HIGH (ref 0.3–1.2)
Total Protein: 7.9 g/dL (ref 6.5–8.1)

## 2021-01-20 SURGERY — ECHOCARDIOGRAM, TRANSESOPHAGEAL
Anesthesia: Monitor Anesthesia Care

## 2021-01-20 MED ORDER — SODIUM CHLORIDE 0.9 % IV SOLN: INTRAVENOUS | Status: DC | PRN

## 2021-01-20 MED ORDER — PHENYLEPHRINE 40 MCG/ML (10ML) SYRINGE FOR IV PUSH (FOR BLOOD PRESSURE SUPPORT)
PREFILLED_SYRINGE | INTRAVENOUS | Status: DC | PRN
  Administered 2021-01-20: 80 ug via INTRAVENOUS

## 2021-01-20 MED ORDER — BACLOFEN 5 MG HALF TABLET
5.0000 mg | ORAL_TABLET | Freq: Four times a day (QID) | ORAL | Status: DC | PRN
  Administered 2021-01-20 – 2021-01-21 (×2): 5 mg via ORAL
  Filled 2021-01-20 (×3): qty 1

## 2021-01-20 MED ORDER — GLYCOPYRROLATE 0.2 MG/ML IJ SOLN
INTRAMUSCULAR | Status: DC | PRN
  Administered 2021-01-20: .1 mg via INTRAVENOUS

## 2021-01-20 MED ORDER — PROPOFOL 500 MG/50ML IV EMUL
INTRAVENOUS | Status: DC | PRN
  Administered 2021-01-20: 125 ug/kg/min via INTRAVENOUS

## 2021-01-20 MED ORDER — SODIUM CHLORIDE 0.45 % IV SOLN: INTRAVENOUS | Status: DC

## 2021-01-20 NOTE — Progress Notes (Signed)
  Echocardiogram Echocardiogram Transesophageal has been performed.  Richard [name] 01/20/2021, 8:23 AM

## 2021-01-20 NOTE — H&P (View-Only) (Signed)
ORTHOPAEDIC CONSULTATION  REQUESTING PHYSICIAN: Alba Cory, MD  Chief Complaint: Gangrenous ulcer left heel.  HPI: Richard [name] is a 60 y.o. male who presents with gangrenous necrotic ulcer left heel.  Patient has a history of heart failure diabetes peripheral vascular disease.  Patient states he has been having trouble with this ulcer for over a year.  He states he has been going to the wound center in [facility] weekly for the past 4 weeks.  He states that there was no indication that there is any problem with his foot.  Past Medical History:  Diagnosis Date   AICD (automatic cardioverter/defibrillator) present 01/11/2017   Anxiety    CHF (congestive heart failure) (HCC)    CKD (chronic kidney disease)    COPD (chronic obstructive pulmonary disease) (HCC)    never smoker, industrial exposure   Diabetes mellitus with diabetic neuropathy, with long-term current use of insulin (HCC)    GERD (gastroesophageal reflux disease)    High cholesterol    History of gout    "take RX qd" (11/01/2017)   Hypertension    Nonobstructive atherosclerosis of coronary artery    On home oxygen therapy    "2L prn" (11/01/2017)   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HCC)    LLL, present for years   Systolic HF (heart failure) (HCC)    Past Surgical History:  Procedure Laterality Date   CATARACT EXTRACTION W/ INTRAOCULAR LENS IMPLANTW/ TRABECULECTOMY Left ~ 2013   "may have done this when I had my other eye OR"   CIRCUMCISION     CORONARY STENT INTERVENTION N/A 11/02/2017   Procedure: CORONARY STENT INTERVENTION;  Surgeon: Laurey Morale, MD;  Location: [facility] INVASIVE CV LAB;  Service: Cardiovascular;  Laterality: N/A;   CORONARY STENT INTERVENTION N/A 11/02/2017   Procedure: CORONARY STENT INTERVENTION;  Surgeon: Lyn Records, MD;  Location: [facility] INVASIVE CV LAB;  Service: Cardiovascular;  Laterality: N/A;   EYE SURGERY Left ~ 2013   "fell on something in basement; stuck in my eye"   ICD IMPLANT      Medtronic Dual Chamber 01/11/17   MULTIPLE TOOTH EXTRACTIONS     "pulled all my top teeth"   RIGHT/LEFT HEART CATH AND CORONARY ANGIOGRAPHY N/A 11/02/2017   Procedure: RIGHT/LEFT HEART CATH AND CORONARY ANGIOGRAPHY;  Surgeon: Laurey Morale, MD;  Location: [facility] INVASIVE CV LAB;  Service: Cardiovascular;  Laterality: N/A;   TEE WITHOUT CARDIOVERSION N/A 01/20/2021   Procedure: TRANSESOPHAGEAL ECHOCARDIOGRAM (TEE);  Surgeon: Laurey Morale, MD;  Location: [facility] ENDOSCOPY;  Service: Cardiovascular;  Laterality: N/A;   Social History   Socioeconomic History   Marital status: Divorced    Spouse name: Not on file   Number of children: Not on file   Years of education: Not on file   Highest education level: Not on file  Occupational History   Occupation: disability    Comment: stopped working in 2000 d/t occupational exposures  Tobacco Use   Smoking status: Former    Types: Cigarettes   Smokeless tobacco: Never   Tobacco comments:    "smoked when I drank"  Vaping Use   Vaping Use: Never used  Substance and Sexual Activity   Alcohol use: Not Currently    Comment: 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  Other Topics Concern   Not on file  Social History Narrative   Patient lives at home with  wife and some of his 10 children. He self-administers his own medications.   Social Determinants of Health   Financial Resource Strain: Low Risk    Difficulty of Paying Living Expenses: Not very hard  Food Insecurity: No Food Insecurity   Worried About Programme researcher, broadcasting/film/video in the Last Year: Never true   Ran Out of Food in the Last Year: Never true  Transportation Needs: No Transportation Needs   Lack of Transportation (Medical): No   Lack of Transportation (Non-Medical): No  Physical Activity: Not on file  Stress: Not on file  Social Connections: Not on file   Family History  Problem Relation Age of Onset   Hypertension  Mother    Diabetes Mother    Hypertension Father    Diabetes Father    Diabetes Sister    Diabetes Brother    - negative except otherwise stated in the family history section Allergies  Allergen Reactions   Aspirin Itching   Codeine Hives   Coconut Oil Itching   Prior to Admission medications   Medication Sig Start Date End Date Taking? Authorizing Provider  acetaminophen (TYLENOL) 650 MG CR tablet Take 650 mg by mouth every 8 (eight) hours as needed for pain.   Yes [provider]  albuterol (VENTOLIN HFA) 108 (90 Base) MCG/ACT inhaler Inhale 1-2 puffs into the lungs every 4 (four) hours as needed for wheezing or shortness of breath. 10/17/20  Yes Alessandra Bevels, MD  allopurinol (ZYLOPRIM) 100 MG tablet Take 1 tablet (100 mg total) by mouth daily. 12/26/17  Yes Clegg, Amy D, NP  aspirin 81 MG chewable tablet Chew 81 mg by mouth daily.   Yes [provider]  atorvastatin (LIPITOR) 40 MG tablet TAKE ONE TABLET BY MOUTH DAILY AT 6PM Patient taking differently: Take 40 mg by mouth daily. 11/15/20  Yes Laurey Morale, MD  bisoprolol (ZEBETA) 5 MG tablet Take 2.5 mg by mouth daily.   Yes [provider]  BREO ELLIPTA 100-25 MCG/INH AEPB Inhale 1 puff into the lungs daily. 01/03/21  Yes [provider]  busPIRone (BUSPAR) 5 MG tablet Take 5 mg by mouth 2 (two) times daily.   Yes [provider]  docusate sodium (COLACE) 100 MG capsule Take 100 mg by mouth daily as needed for mild constipation.   Yes [provider]  DULoxetine (CYMBALTA) 60 MG capsule Take 60 mg by mouth daily. 09/28/17  Yes [provider]  eplerenone (INSPRA) 50 MG tablet Take 1 tablet (50 mg total) by mouth daily. 07/27/20  Yes Laurey Morale, MD  fenofibrate (TRICOR) 145 MG tablet TAKE ONE TABLET BY MOUTH DAILY Patient taking differently: Take 145 mg by mouth daily. 12/15/20  Yes Laurey Morale, MD  hydrALAZINE (APRESOLINE) 50 MG tablet Take 1.5 tablets (75 mg  total) by mouth 3 (three) times daily. 09/09/20  Yes Laurey Morale, MD  insulin regular human CONCENTRATED (HUMULIN R) 500 UNIT/ML kwikpen Inject 0-400 Units into the skin as directed. Sliding 07/18/17  Yes [provider]  JARDIANCE 25 MG TABS tablet Take 25 mg by mouth daily. 10/15/19  Yes Laurey Morale, MD  MAGNESIUM-OXIDE 400 (241.3 Mg) MG tablet Take 1 tablet (400 mg total) by mouth daily. 08/27/20  Yes Laurey Morale, MD  pregabalin (LYRICA) 150 MG capsule Take 150 mg by mouth 3 (three) times daily. 10/07/20  Yes [provider]  sacubitril-valsartan (ENTRESTO) 97-103 MG Take 1 tablet by mouth 2 (two) times daily. 03/17/20  Yes McLean, Dalton S, MD  SSD 1 % cream Apply 1 application topically daily as needed for wound care. 12/20/20  Yes [provider]  Torsemide 40 MG TABS Take 80 mg by mouth in the morning AND 60 mg every evening. 12/07/20  Yes McLean, Dalton S, MD  digoxin (LANOXIN) 0.125 MG tablet TAKE ONE TABLET BY MOUTH DAILY Must keep further appointments for refills 01/17/21   McLean, Dalton S, MD  icosapent Ethyl (VASCEPA) 1 g capsule Take 2 capsules (2 g total) by mouth 2 (two) times daily. Patient not taking: No sig reported 10/08/20   McLean, Dalton S, MD  Insulin Pen Needle (NOVOFINE) 30G X 8 MM MISC Inject 10 each into the skin as needed. 11/06/17   Amin, Sumayya, MD  isosorbide dinitrate (ISORDIL) 20 MG tablet TAKE TWO TABLETS BY MOUTH THREE TIMES A DAY 12/15/20   McLean, Dalton S, MD  omeprazole (PRILOSEC) 20 MG capsule TAKE ONE CAPSULE BY MOUTH DAILY Patient not taking: No sig reported 10/20/20   McLean, Dalton S, MD   No results found. - pertinent xrays, CT, MRI studies were reviewed and independently interpreted  Positive ROS: All other systems have been reviewed and were otherwise negative with the exception of those mentioned in the HPI and as above.  Physical Exam: General: Alert, no acute distress Psychiatric: Patient is competent for consent  with normal mood and affect Lymphatic: No axillary or cervical lymphadenopathy Cardiovascular: No pedal edema Respiratory: No cyanosis, no use of accessory musculature GI: No organomegaly, abdomen is soft and non-tender    Images:  @ENCIMAGES@  Labs:  Lab Results  Component Value Date   HGBA1C 10.3 (H) 01/17/2021   HGBA1C 10.4 (H) 10/31/2017   LABURIC 8.2 09/22/2019   REPTSTATUS PENDING 01/16/2021   CULT  01/16/2021    NO GROWTH 4 DAYS Performed at [facility], [address] [place],  [address]    LABORGA STREPTOCOCCUS GROUP G 01/15/2021    Lab Results  Component Value Date   ALBUMIN 2.1 (L) 01/20/2021   ALBUMIN 2.2 (L) 01/19/2021   ALBUMIN 2.2 (L) 01/18/2021   PREALBUMIN 8.5 (L) 01/16/2021   LABURIC 8.2 09/22/2019     CBC EXTENDED Latest Ref Rng & Units 01/20/2021 01/19/2021 01/18/2021  WBC 4.0 - 10.5 K/uL 34.1(H) 29.3(H) 23.1(H)  RBC 4.22 - 5.81 MIL/uL 5.60 5.77 5.84(H)  HGB 13.0 - 17.0 g/dL 16.1 16.3 17.1(H)  HCT 39.0 - 52.0 % 49.1 50.8 51.8  PLT 150 - 400 K/uL 255 323 348  NEUTROABS 1.7 - 7.7 K/uL 29.9(H) 25.6(H) 19.6(H)  LYMPHSABS 0.7 - 4.0 K/uL 1.3 1.0 1.1    Neurologic: Patient does not have protective sensation bilateral lower extremities.   MUSCULOSKELETAL:   Skin: Examination patient has a black necrotic foul-smelling ulcer involving the entire heel pad.   He does have a thready palpable pulse bilaterally.    Review of the CT scan shows air in the soft tissue involving the entire heel pad.  There is periarticular cystic changes and the bones of the foot consistent with chronic gout.    Patient is septic with a white cell count of 34.1 he has forced expiratory breathing on nasal cannula oxygen.  [name] albumin is 2.1.  Hemoglobin A1c consistently elevated above 10.  Hemoglobin 16.1.  Examination the right foot he has a ulcer over the medial MTP joint of the great toe.  This is concerning as well for a deep  infection.  Assessment: Assessment: Uncontrolled type   2 diabetes with severe protein caloric malnutrition with peripheral vascular disease with gangrene and necrotic abscess involving the left heel with a small ulcer to the right foot great toe MTP joint with venous insufficiency ulcer to the right leg.  Plan: I discussed with the patient and his wife [name] on the phone that patient will require a below the knee amputation on the left may require surgery on the right.  Discussed that he is septic and urgent surgery is necessary.  Patient expressed concerns that he was not told by the wound center in [facility] that there was anything wrong with his foot.  Plan for left below the knee amputation tomorrow Friday.  Thank you for the consult and the opportunity to see Mr. Abdon Petrosky, MD [facility] [phone] 6:42 PM

## 2021-01-20 NOTE — Interval H&P Note (Signed)
History and Physical Interval Note:  01/20/2021 7:53 AM  Richard [name]  has presented today for surgery, with the diagnosis of BACTEREMIA.  The various methods of treatment have been discussed with the patient and family. After consideration of risks, benefits and other options for treatment, the patient has consented to  Procedure(s): TRANSESOPHAGEAL ECHOCARDIOGRAM (TEE) (N/A) as a surgical intervention.  The patient's history has been reviewed, patient examined, no change in status, stable for surgery.  I have reviewed the patient's chart and labs.  Questions were answered to the patient's satisfaction.     Mak Bonny [employer]

## 2021-01-20 NOTE — Anesthesia Procedure Notes (Signed)
Procedure Name: MAC Date/Time: 01/20/2021 7:45 AM Performed by: Kathryne Hitch, CRNA Pre-anesthesia Checklist: Patient identified, Emergency Drugs available, Suction available and Patient being monitored Patient Re-evaluated:Patient Re-evaluated prior to induction Oxygen Delivery Method: Nasal cannula Preoxygenation: Pre-oxygenation with 100% oxygen Induction Type: IV induction Placement Confirmation: positive ETCO2

## 2021-01-20 NOTE — TOC Initial Note (Signed)
Transition of Care [facility]) - Initial/Assessment Note    Patient Details  Name: Richard [name] MRN: [id number] Date of Birth: August 01, 1960  [facility]) CM/SW Contact:    Elliot Cousin, RN Phone Number: [phone] 01/20/2021, 3:27 PM  Clinical Narrative:                 HF TOC CM spoke to pt and states he lives at home with wife. He has oxygen and nebulizer machine at home. Oxygen serviced by [employer] Offered choice for [facility]. Pt requested [facility]. Gave permission to speak to wife, [name].  Contacted wife and explained pt may need HH and DME at dc. States she prefers pt to have a Agricultural consultant with seat and 3n1 bedside commode.  Waiting PT evaluation. Contacted [employer] rep, Pam and [facility] rep, Pearson Grippe for possible dc home with IV abx. Pt followed by HF Paramedicine Team.   Expected Discharge Plan: Home w Home Health Services Barriers to Discharge: Continued Medical Work up   Patient Goals and CMS Choice Patient states their goals for this hospitalization and ongoing recovery are:: agreeable to Home Health CMS Medicare.gov Compare Post Acute Care list provided to:: Patient Choice offered to / list presented to : Patient  Expected Discharge Plan and Services Expected Discharge Plan: Home w Home Health Services In-house Referral: Clinical Social Work Discharge Planning Services: CM Consult Post Acute Care Choice: Durable Medical Equipment Living arrangements for the past 2 months: Single Family Home                           HH Arranged: RN, PT [facility] Agency: [facility] [employer] Date HH Agency Contacted: 01/20/21 Time HH Agency Contacted: 1526 Representative spoke with at [facility] Agency: Jeri Modena RN-[employer], [facility]  Prior Living Arrangements/Services Living arrangements for the past 2 months: Single Family Home Lives with:: Spouse Patient language and need for interpreter reviewed::  Yes Do you feel safe going back to the place where you live?: Yes      Need for Family Participation in Patient Care: Yes (Comment) Care giver support system in place?: Yes (comment) Current home services: DME (oxygen ([facility]), nebulizer machine)    Activities of Daily Living      Permission Sought/Granted Permission sought to share information with : Case Manager, PCP, Family Supports Permission granted to share information with : Yes, Verbal Permission Granted  Share Information with NAME: Staci Righter  Permission granted to share info w AGENCY: Home Health, DME  Permission granted to share info w Relationship: wife  Permission granted to share info w Contact Information: [phone]  Emotional Assessment   Attitude/Demeanor/Rapport: Gracious Affect (typically observed): Accepting Orientation: : Oriented to Self, Oriented to Place, Oriented to  Time, Oriented to Situation   Psych Involvement: No (comment)  Admission diagnosis:  Abdominal distension [R14.0] SOB (shortness of breath) [R06.02] COPD exacerbation (HCC) [J44.1] Patient Active Problem List   Diagnosis Date Noted   Open wound    Bacteremia    Chronic ulcer of left ankle (HCC)    Severe sepsis without septic shock (HCC)    COPD exacerbation (HCC) 01/15/2021   Chronic systolic CHF (congestive heart failure) (HCC) 01/15/2021   Acute on chronic respiratory failure with hypoxia and hypercapnia (HCC) 01/15/2021   Acute renal failure superimposed on stage 3 chronic kidney disease (HCC) 01/15/2021   Elevated troponin 01/15/2021  Hyponatremia 01/15/2021   CHF exacerbation (HCC) 10/17/2020   Acute on chronic systolic CHF (congestive heart failure) (HCC) 10/16/2020   Acute on chronic combined systolic and diastolic CHF (congestive heart failure) (HCC) 03/15/2018   S/P right and left heart catheterization 11/02/2017   Panic attacks 11/02/2017   Non-STEMI (non-ST elevated myocardial infarction) (HCC)    COPD  (chronic obstructive pulmonary disease) (HCC)    Hypertension    CKD (chronic kidney disease)    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HCC)    Presence of implantable cardioverter-defibrillator (ICD)    GERD (gastroesophageal reflux disease)    Diabetes mellitus with diabetic neuropathy, with long-term current use of insulin (HCC)    PCP:  Ananias Pilgrim, MD Pharmacy:   [facility] - [place], [place] - [address] [facility] [address] [place] [address] Phone: [phone] Fax: [phone]  [facility] [address] [place] [address] Phone: [phone] Fax: [phone]     Social Determinants of Health (SDOH) Interventions    Readmission Risk Interventions No flowsheet data found.

## 2021-01-20 NOTE — Progress Notes (Signed)
1320 Checked with pt to walk. Pt looks tired and sleepy. Stated he walked whole unit recently and tired now. Asked that Cardiac Rehab return tomorrow. Will continue to follow. Luetta Nutting RN BSN 01/20/2021 1:28 PM

## 2021-01-20 NOTE — Progress Notes (Signed)
Patient ID: Richard [name], male   DOB: 20-Dec-1960, 60 y.o.   MRN: [id number]     Advanced Heart Failure Rounding Note  PCP-Cardiologist: Dr. Shirlee Latch  Subjective:    Group G Strep bacteremia. Left foot wound suspected source. TEE today showed severely dilated LV with EF 15-20%, moderate RV dilation/moderate RV dysfunction, no vegetation.   Patient in endo currently so cannot get CVP, but was 5 yesterday evening and he diuresed vigorously yesterday with po torsemide.  Creatinine 2.37 > 1.87 > 1.64 > 1.57 > 1.46 > 1.75.   Objective:   Weight Range: 115.8 kg Body mass index is 30.29 kg/m.   Vital Signs:   Temp:  [97.5 F (36.4 C)-98 F (36.7 C)] 97.9 F (36.6 C) (07/28 0705) Pulse Rate:  [62-97] 75 (07/28 0705) Resp:  [12-28] 17 (07/28 0705) BP: (95-127)/(51-88) 95/51 (07/28 0705) SpO2:  [89 %-100 %] 97 % (07/28 0705) Weight:  [115.8 kg] 115.8 kg (07/28 0400) Last BM Date: 01/18/21  Weight change: Filed Weights   01/18/21 0548 01/19/21 0416 01/20/21 0400  Weight: 117.5 kg 117.1 kg 115.8 kg    Intake/Output:   Intake/Output Summary (Last 24 hours) at 01/20/2021 0807 Last data filed at 01/20/2021 0800 Gross per 24 hour  Intake 1365.15 ml  Output 6000 ml  Net -4634.85 ml      Physical Exam   General: NAD Neck: No JVD, no thyromegaly or thyroid nodule.  Lungs: Clear to auscultation bilaterally with normal respiratory effort. CV: Lateral PMI.  Heart regular S1/S2, no S3/S4, no murmur.  No peripheral edema.  Abdomen: Soft, nontender, no hepatosplenomegaly, no distention.  Skin: Wound left foot.   Neurologic: Alert and oriented x 3.  Psych: Normal affect. Extremities: No clubbing or cyanosis.  HEENT: Normal.    Telemetry   NSR 70s (personally reviewed)   Labs    CBC Recent Labs    01/19/21 0500 01/20/21 0357  WBC 29.3* 34.1*  NEUTROABS 25.6* 29.9*  HGB 16.3 16.1  HCT 50.8 49.1  MCV 88.0 87.7  PLT 323 255   Basic Metabolic Panel Recent Labs     01/19/21 0634 01/20/21 0357  NA 133* 131*  K 4.6 4.5  CL 95* 89*  CO2 30 30  GLUCOSE 144* 181*  BUN 64* 63*  CREATININE 1.46* 1.75*  CALCIUM 8.7* 8.7*   Liver Function Tests Recent Labs    01/19/21 0500 01/20/21 0357  AST 26 21  ALT 25 22  ALKPHOS 70 65  BILITOT 1.4* 1.4*  PROT 8.1 7.9  ALBUMIN 2.2* 2.1*   No results for input(s): LIPASE, AMYLASE in the last 72 hours. Cardiac Enzymes No results for input(s): CKTOTAL, CKMB, CKMBINDEX, TROPONINI in the last 72 hours.   BNP: BNP (last 3 results) Recent Labs    10/16/20 1810 11/09/20 1519 01/15/21 1545  BNP 329.7* 373.4* 532.8*    ProBNP (last 3 results) No results for input(s): PROBNP in the last 8760 hours.   D-Dimer No results for input(s): DDIMER in the last 72 hours. Hemoglobin A1C No results for input(s): HGBA1C in the last 72 hours.  Fasting Lipid Panel No results for input(s): CHOL, HDL, LDLCALC, TRIG, CHOLHDL, LDLDIRECT in the last 72 hours. Thyroid Function Tests No results for input(s): TSH, T4TOTAL, T3FREE, THYROIDAB in the last 72 hours.  Invalid input(s): FREET3   Other results:   Imaging    No results found.   Medications:     Scheduled Medications:  [MAR Hold] allopurinol  100 mg Oral  Daily   [MAR Hold] aspirin EC  81 mg Oral Daily   [MAR Hold] atorvastatin  40 mg Oral Daily   [MAR Hold] Chlorhexidine Gluconate Cloth  6 each Topical Daily   [MAR Hold] docusate sodium  100 mg Oral BID   [MAR Hold] empagliflozin  10 mg Oral Daily   [MAR Hold] feeding supplement (GLUCERNA SHAKE)  237 mL Oral BID BM   [MAR Hold] heparin  5,000 Units Subcutaneous Q8H   [MAR Hold] hydrALAZINE  10 mg Oral Q8H   [MAR Hold] insulin aspart  0-15 Units Subcutaneous TID WC   [MAR Hold] insulin aspart  0-5 Units Subcutaneous QHS   [MAR Hold] insulin aspart  4 Units Subcutaneous TID WC   [MAR Hold] insulin glargine  10 Units Subcutaneous BID   [MAR Hold] ipratropium-albuterol  3 mL Nebulization BID    [MAR Hold] isosorbide mononitrate  30 mg Oral Daily   [MAR Hold] multivitamin with minerals  1 tablet Oral Daily   [MAR Hold] mupirocin ointment  1 application Nasal BID   [MAR Hold] pantoprazole  40 mg Oral BID   predniSONE  40 mg Oral Q breakfast   [MAR Hold] Ensure Max Protein  11 oz Oral QHS   [MAR Hold] senna  1 tablet Oral BID   [MAR Hold] sodium chloride flush  10-40 mL Intracatheter Q12H   [MAR Hold] spironolactone  12.5 mg Oral Daily    Infusions:  sodium chloride     [MAR Hold] penicillin g continuous IV infusion 12 Million Units (01/19/21 2118)    PRN Medications: [MAR Hold] acetaminophen **OR** [MAR Hold] acetaminophen, [MAR Hold] albuterol, [MAR Hold] baclofen, [MAR Hold] sodium chloride flush   Assessment/Plan   1. Acute on chronic systolic CHF:  Primarily nonischemic cardiomyopathy.  Echo in 2017 with EF 15-20%.  [employer] ICD 2018. LHC/RHC 11/06/17 showed volume overload with 80% RCA stenosis.  Cardiac index low at 1.91. The degree of coronary disease does not explain his cardiomyopathy.  PYP scan in 7/21 not suggestive of transthyretin amyloidosis. Admitted 07/23 with acute on chronic CHF in setting of acute on chronic respiratory faliure with hypoxia/possible COPD exacerbation and group G strep bacteremia.  Echo this admission with with EF 20-25%, anterior, anteroseptal, anterolateral and apical akinesis. Today, co-ox 78%.  CVP 5 yesterday evening and diuresed vigorously with torsemide.  Creatinine higher at 1.75 today.  - Hold torsemide today, follow CVP closely.  - Continue hydralazine 10 mg TID/Imdur 30 mg daily. No BP room to increase.  - Continue Jardiance - Continue spiro 12.5 mg daily. Monitor K. - Not adherent with medications PTA. Followed by paramedicine in the community. Has not kept most recent clinic f/u. Did not return calls to reschedule home visits this past month.  2. Acute on chronic hypoxic respiratory failure: Initially requiring BiPAP, now 4-5L 02 [place].   Likely d/t combination of COPD exacerbation and acute on chronic CHF. CCM saw and felt CHF more likely contributing to respiratory failure - Has home 02 but admits he does not use it regularly 3. COPD with possible acute exacerbation: Never smoked, but apparently had significant occupational exposure.  It appears that he won a lawsuit dealing with the occupational exposure-related COPD. CPX in 2/21 showed severe functional limitation, but it appeared to be due to pulmonary abnormalities rather than HF.  He is on home oxygen.  -Treated for COPD exacerbation per triad 4. Group G strep bacteremia: Leg wounds suspected source. On PCN G.  No endocarditis on TEE.  -  Pending imaging of foot to rule out osteomyelitis  5. CAD:  LHC in 5/19 with 80-90% proximal RCA stenosis treated with DES to RCA.  This did not cause his cardiomyopathy, has a large RCA.  No chest pain.  -Continue aspirin and statin 6. Polycythemia: Likely related to chronic hypoxia. 7. OSA: Cannot tolerate CPAP, has 02 to wear at night but not adherent in community 8. DM II, uncontrolled:A1c 10.3 - He is on insulin.   - Restarted Jardiance 9. Hypertriglyceridemia: Off Vascepa due to blood in stool that resolved. - Continue fenofibrate 145 mg daily.  10. AKI on CKD: Has stage IIIa CKD at baseline. Baseline creatinine 1.5 - with rise in creatinine to 1.75, hold torsemide today.  Restart when CVP rises.  11. Foot wound: Will check ABIs.    SDOH: -Not adherent with follow-ups or home care visits. Enrolled in Paramedicine in the community. -Reports access to medications and transportation. Not taking medicines as prescribed PTA. -TOC consult.   Length of Stay: 5  Marca Ancona, MD  01/20/2021, 8:07 AM  Advanced Heart Failure Team Pager [phone] (M-F; 7a - 5p)  Please contact [facility] for night-coverage after hours (5p -7a ) and weekends on [email or web address]

## 2021-01-20 NOTE — Progress Notes (Signed)
[facility]  Date of Admission:  01/15/2021     Reason for Consult: bacteremia                                  Referring Provider: Sharolyn DouglasEzenduka     Lines:  7/25-c RUE picc     Abx: 7/23-c azith 7/26-c penicillin iv  7/23-26 ceftriaxone 7/23-26 vanc                                                          Assessment: 60 yo male with dm2, copd on home o2 (2L), nonischemic cardiomyopathy s/p AICD 2018, chronic venous stasis, chronic left heel ulcer, ckd admtited 7/23 with acute (24 hours) myalgia/dyspnea/malaise found to have group g bacteremia/sepsis and heart failure exacerbation and AKI on ckd (baseline cr 1.3-1.5)   7/23 bcx group g strep 1 of 2 set 7/24 bcx negative to date   A picc was placed 7/25 (2 days on appropriate abx and negative repeat bcx)   Group g strep is a beta-hemolytic skin flora strep rather than odontogenic. It is also less likely to cause endocarditis/catheter infection than the viridan streptococcus. And he presented with low burden bacteremia   Patient had chronic left heel unstageable ulcer, which is likely the source of the bacteremia. While we are treating the bacteremia, will need to investigate for deep seated soft tissue/bone infection. He'll benefit from vascular/orthopedics evaluation too as this ulcer is likely non-healing and is source for more bacteremia/pacer seeding complication   Tte so far negative for IE/pacer thrombus. Will need tee. If tee is negative, and the left ankle demonstrate no osteomyelitis/abscess, reasonable to treat for 2 weeks   Suspect this is all group g strep sepsis. PNA dx is confounded but doubt, however reasonable to finish 5 day azithromycin     7/28 assessment Tee today ef 15-20%, no LA appendage thrombus. No vegetation on valve/icd Patient has been refusing xray of foot I discussed with him why need for left foot xray (r/o osteomyelitis and in which case would strongly consider  amputation to reduce risk future infection and icd involvement)  As he has been refusing, will go straight for ct scan (icd unclear compatibility with mri)  Still potentially could be pacer/icd infection even with negative tee, but given this is streptococcal rather than candida/staph aureus reasonable to treat 2 weeks and see (pending r/o osteomyelitis of the left heel)   Plan: Await ct scan left heel; if presence of OM would discuss with ortho/vascular and strongly consider bka (although CHF might not be amenable) to reduce risk future bacteremia/icd seeding Continue penicillin g iv for strep bacteremia Finish azith after today Discussed with primary team 5.  Discusssed with patient need for ct scan left foot  I spent more than 35 minute reviewing data/chart, and coordinating care and >50% direct face to face time providing counseling/discussing diagnostics/treatment plan with patient  Principal Problem:   Acute on chronic respiratory failure with hypoxia and hypercapnia (HCC) Active Problems:   Hypertension   CKD (chronic kidney disease)   Presence of implantable cardioverter-defibrillator (ICD)   GERD (gastroesophageal reflux disease)   Diabetes mellitus with diabetic neuropathy, with long-term current use  of insulin (HCC)   COPD exacerbation (HCC)   Chronic systolic CHF (congestive heart failure) (HCC)   Acute renal failure superimposed on stage 3 chronic kidney disease (HCC)   Elevated troponin   Hyponatremia   Bacteremia   Chronic ulcer of left ankle (HCC)   Severe sepsis without septic shock (HCC)   Open wound   Allergies  Allergen Reactions   Aspirin Itching   Codeine Hives   Coconut Oil Itching    Scheduled Meds:  allopurinol  100 mg Oral Daily   aspirin EC  81 mg Oral Daily   atorvastatin  40 mg Oral Daily   Chlorhexidine Gluconate Cloth  6 each Topical Daily   docusate sodium  100 mg Oral BID   empagliflozin  10 mg Oral Daily   feeding supplement (GLUCERNA  SHAKE)  237 mL Oral BID BM   heparin  5,000 Units Subcutaneous Q8H   hydrALAZINE  10 mg Oral Q8H   insulin aspart  0-15 Units Subcutaneous TID WC   insulin aspart  0-5 Units Subcutaneous QHS   insulin aspart  4 Units Subcutaneous TID WC   insulin glargine  10 Units Subcutaneous BID   ipratropium-albuterol  3 mL Nebulization BID   isosorbide mononitrate  30 mg Oral Daily   multivitamin with minerals  1 tablet Oral Daily   mupirocin ointment  1 application Nasal BID   pantoprazole  40 mg Oral BID   Ensure Max Protein  11 oz Oral QHS   senna  1 tablet Oral BID   sodium chloride flush  10-40 mL Intracatheter Q12H   spironolactone  12.5 mg Oral Daily   Continuous Infusions:  penicillin g continuous IV infusion 12 Million Units (01/20/21 1308)   PRN Meds:.acetaminophen **OR** acetaminophen, albuterol, baclofen, sodium chloride flush   SUBJECTIVE: Patient refusing xray left heel per tech. I discussed with him he doesn't give a reason I relate why we need to evaluate to osteomyelitis left heel Reivewed tee finding with patient No n/v/diarrhea No f/c Kidney function improving  Review of Systems: ROS All other ROS was negative, except mentioned above     OBJECTIVE: Vitals:   01/20/21 0820 01/20/21 0830 01/20/21 0840 01/20/21 1222  BP: 106/79 (!) 128/91 118/90 122/79  Pulse:    63  Resp:    20  Temp:      TempSrc:      SpO2: 91%  95% 93%  Weight:      Height:       Body mass index is 30.29 kg/m.  Physical Exam General/constitutional: well developed; no distress, pleasant HEENT: Normocephalic, PER, Conj Clear, EOMI, Oropharynx clear Neck supple CV: rrr no mrg Lungs: clear to auscultation, normal respiratory effort Abd: Soft, Nontender Ext: no edema Skin: bilateral LE in compression wrap; no rash otherwise Neuro: nonfocal MSK: no peripheral joint swelling/tenderness/warmth; back spines nontender   Central line presence: RUE picc site no  erythema/swelling/purulence   Lab Results Lab Results  Component Value Date   WBC 34.1 (H) 01/20/2021   HGB 16.1 01/20/2021   HCT 49.1 01/20/2021   MCV 87.7 01/20/2021   PLT 255 01/20/2021    Lab Results  Component Value Date   CREATININE 1.75 (H) 01/20/2021   BUN 63 (H) 01/20/2021   NA 131 (L) 01/20/2021   K 4.5 01/20/2021   CL 89 (L) 01/20/2021   CO2 30 01/20/2021    Lab Results  Component Value Date   ALT 22 01/20/2021   AST 21 01/20/2021  ALKPHOS 65 01/20/2021   BILITOT 1.4 (H) 01/20/2021      Microbiology: Recent Results (from the past 240 hour(s))  Culture, blood (routine x 2)     Status: None   Collection Time: 01/15/21  3:41 PM   Specimen: BLOOD RIGHT HAND  Result Value Ref Range Status   Specimen Description BLOOD RIGHT HAND  Final   Special Requests   Final    BOTTLES DRAWN AEROBIC AND ANAEROBIC Blood Culture adequate volume   Culture   Final    NO GROWTH 5 DAYS Performed at [facility], [address], [place], [place] [address]    Report Status 01/20/2021 FINAL  Final  Resp Panel by RT-PCR (Flu A&B, Covid) Nasopharyngeal Swab     Status: None   Collection Time: 01/15/21  3:45 PM   Specimen: Nasopharyngeal Swab; Nasopharyngeal(NP) swabs in vial transport medium  Result Value Ref Range Status   SARS Coronavirus 2 by RT PCR NEGATIVE NEGATIVE Final    Comment: (NOTE) SARS-CoV-2 target nucleic acids are NOT DETECTED.  The SARS-CoV-2 RNA is generally detectable in upper respiratory specimens during the acute phase of infection. The lowest concentration of SARS-CoV-2 viral copies this assay can detect is 138 copies/mL. A negative result does not preclude SARS-Cov-2 infection and should not be used as the sole basis for treatment or other patient management decisions. A negative result may occur with  improper specimen collection/handling, submission of specimen other than nasopharyngeal swab, presence of viral mutation(s) within the areas  targeted by this assay, and inadequate number of viral copies(<138 copies/mL). A negative result must be combined with clinical observations, patient history, and epidemiological information. The expected result is Negative.  Fact Sheet for Patients:  BloggerCourse.com  Fact Sheet for Healthcare Providers:  SeriousBroker.it  This test is no t yet approved or cleared by the [place] FDA and  has been authorized for detection and/or diagnosis of SARS-CoV-2 by FDA under an Emergency Use Authorization (EUA). This EUA will remain  in effect (meaning this test can be used) for the duration of the COVID-19 declaration under Section 564(b)(1) of the Act, 21 U.S.C.section 360bbb-3(b)(1), unless the authorization is terminated  or revoked sooner.       Influenza A by PCR NEGATIVE NEGATIVE Final   Influenza B by PCR NEGATIVE NEGATIVE Final    Comment: (NOTE) The Xpert Xpress SARS-CoV-2/FLU/RSV plus assay is intended as an aid in the diagnosis of influenza from Nasopharyngeal swab specimens and should not be used as a sole basis for treatment. Nasal washings and aspirates are unacceptable for Xpert Xpress SARS-CoV-2/FLU/RSV testing.  Fact Sheet for Patients: BloggerCourse.com  Fact Sheet for Healthcare Providers: SeriousBroker.it  This test is not yet approved or cleared by the [place] FDA and has been authorized for detection and/or diagnosis of SARS-CoV-2 by FDA under an Emergency Use Authorization (EUA). This EUA will remain in effect (meaning this test can be used) for the duration of the COVID-19 declaration under Section 564(b)(1) of the Act, 21 U.S.C. section 360bbb-3(b)(1), unless the authorization is terminated or revoked.  Performed at [facility], [address] [place], [place] [address]   Culture, blood (routine x 2)     Status: Abnormal   Collection  Time: 01/15/21  6:05 PM   Specimen: BLOOD LEFT FOREARM  Result Value Ref Range Status   Specimen Description BLOOD LEFT FOREARM  Final   Special Requests   Final    BOTTLES DRAWN AEROBIC AND ANAEROBIC  Blood Culture adequate volume   Culture  Setup Time   Final    GRAM POSITIVE COCCI IN CHAINS AEROBIC BOTTLE ONLY CRITICAL RESULT CALLED TO, READ BACK BY AND VERIFIED WITH: PHARM D K.HAMMONS ON [id number] AT 0935 BY E.PARRISH    Culture (A)  Final    STREPTOCOCCUS GROUP G STAPHYLOCOCCUS HOMINIS CRITICAL RESULT CALLED TO, READ BACK BY AND VERIFIED WITH: PHARMD JEREMY F. [id number] FCP THE SIGNIFICANCE OF ISOLATING THIS ORGANISM FROM A SINGLE VENIPUNCTURE CANNOT BE PREDICTED WITHOUT FURTHER CLINICAL AND CULTURE CORRELATION. SUSCEPTIBILITIES AVAILABLE ONLY ON REQUEST. Performed at [facility], [address] [place], [place] [address]    Report Status 01/19/2021 FINAL  Final   Organism ID, Bacteria STREPTOCOCCUS GROUP G  Final      Susceptibility   Streptococcus group g - MIC*    CLINDAMYCIN RESISTANT Resistant     AMPICILLIN <=0.25 SENSITIVE Sensitive     ERYTHROMYCIN 4 RESISTANT Resistant     VANCOMYCIN 0.5 SENSITIVE Sensitive     CEFTRIAXONE <=0.12 SENSITIVE Sensitive     LEVOFLOXACIN 1 SENSITIVE Sensitive     PENICILLIN Value in next row Sensitive      SENSITIVEMIC <=0.06    * STREPTOCOCCUS GROUP G  Blood Culture ID Panel (Reflexed)     Status: Abnormal   Collection Time: 01/15/21  6:05 PM  Result Value Ref Range Status   Enterococcus faecalis NOT DETECTED NOT DETECTED Final   Enterococcus Faecium NOT DETECTED NOT DETECTED Final   Listeria monocytogenes NOT DETECTED NOT DETECTED Final   Staphylococcus species DETECTED (A) NOT DETECTED Final    Comment: CRITICAL RESULT CALLED TO, READ BACK BY AND VERIFIED WITH: PHARM D K.HAMMONS ON [id number] AT 0935 BY E.PARRISH    Staphylococcus aureus (BCID) NOT DETECTED NOT DETECTED Final   Staphylococcus epidermidis NOT DETECTED NOT  DETECTED Final   Staphylococcus lugdunensis NOT DETECTED NOT DETECTED Final   Streptococcus species DETECTED (A) NOT DETECTED Final    Comment: Not Enterococcus species, Streptococcus agalactiae, Streptococcus pyogenes, or Streptococcus pneumoniae. CRITICAL RESULT CALLED TO, READ BACK BY AND VERIFIED WITH: PHARM D K.HAMMONS ON [id number] AT 0935 BY E.PARRISH    Streptococcus agalactiae NOT DETECTED NOT DETECTED Final   Streptococcus pneumoniae NOT DETECTED NOT DETECTED Final   Streptococcus pyogenes NOT DETECTED NOT DETECTED Final   A.calcoaceticus-baumannii NOT DETECTED NOT DETECTED Final   Bacteroides fragilis NOT DETECTED NOT DETECTED Final   Enterobacterales NOT DETECTED NOT DETECTED Final   Enterobacter cloacae complex NOT DETECTED NOT DETECTED Final   Escherichia coli NOT DETECTED NOT DETECTED Final   Klebsiella aerogenes NOT DETECTED NOT DETECTED Final   Klebsiella oxytoca NOT DETECTED NOT DETECTED Final   Klebsiella pneumoniae NOT DETECTED NOT DETECTED Final   Proteus species NOT DETECTED NOT DETECTED Final   Salmonella species NOT DETECTED NOT DETECTED Final   Serratia marcescens NOT DETECTED NOT DETECTED Final   Haemophilus influenzae NOT DETECTED NOT DETECTED Final   Neisseria meningitidis NOT DETECTED NOT DETECTED Final   Pseudomonas aeruginosa NOT DETECTED NOT DETECTED Final   Stenotrophomonas maltophilia NOT DETECTED NOT DETECTED Final   Candida albicans NOT DETECTED NOT DETECTED Final   Candida auris NOT DETECTED NOT DETECTED Final   Candida glabrata NOT DETECTED NOT DETECTED Final   Candida krusei NOT DETECTED NOT DETECTED Final   Candida parapsilosis NOT DETECTED NOT DETECTED Final   Candida tropicalis NOT DETECTED NOT DETECTED Final   Cryptococcus neoformans/gattii NOT DETECTED NOT DETECTED Final    Comment: Performed  at [facility], [address], [place], [place] [address]  MRSA Next Gen by PCR, Nasal     Status: Abnormal   Collection Time: 01/15/21 10:38  PM   Specimen: Nasal Mucosa; Nasal Swab  Result Value Ref Range Status   MRSA by PCR Next Gen DETECTED (A) NOT DETECTED Final    Comment: RESULT CALLED TO, READ BACK BY AND VERIFIED WITH: RN M.SHULL ON [id number] AT 1121 BY E.PARRISH (NOTE) The GeneXpert MRSA Assay (FDA approved for NASAL specimens only), is one component of a comprehensive MRSA colonization surveillance program. It is not intended to diagnose MRSA infection nor to guide or monitor treatment for MRSA infections. Test performance is not FDA approved in patients less than 49 years old. Performed at [facility], [address] [place], [place] [address]   Culture, blood (routine x 2)     Status: None (Preliminary result)   Collection Time: 01/16/21 11:38 AM   Specimen: BLOOD  Result Value Ref Range Status   Specimen Description BLOOD RIGHT ANTECUBITAL  Final   Special Requests   Final    BOTTLES DRAWN AEROBIC ONLY Blood Culture adequate volume   Culture   Final    NO GROWTH 4 DAYS Performed at [facility], [address] [place], [place] [address]    Report Status PENDING  Incomplete  Culture, blood (routine x 2)     Status: None (Preliminary result)   Collection Time: 01/16/21 11:39 AM   Specimen: BLOOD RIGHT HAND  Result Value Ref Range Status   Specimen Description BLOOD RIGHT HAND  Final   Special Requests   Final    BOTTLES DRAWN AEROBIC ONLY Blood Culture results may not be optimal due to an inadequate volume of blood received in culture bottles   Culture   Final    NO GROWTH 4 DAYS Performed at [facility], [address], [place], [place] [address]    Report Status PENDING  Incomplete     Serology:   Imaging: If present, new imagings (plain films, ct scans, and mri) have been personally visualized and interpreted; radiology reports have been reviewed. Decision making incorporated into the Impression / Recommendations.  7/23 cxr 1. Cardiomegaly with mild central vascular  congestion. 2. No evidence of bowel obstruction.   7/24 tte 1. The anterior,anteroseptal,anterolateral walls are akinetic. The apex  is akinetic. Marland Kitchen Left ventricular ejection fraction, by estimation, is 20 to  25%. The left ventricle has severely decreased function. The left  ventricle demonstrates regional wall  motion abnormalities (see scoring diagram/findings for description). The  left ventricular internal cavity size was moderately to severely dilated.  Left ventricular diastolic parameters are indeterminate.   2. RV poorly visualized. Grossly appears mildly enlarged with low normal  to mildly decreased function. Right ventricular systolic function was not  well visualized. The right ventricular size is not well visualized.  Tricuspid regurgitation signal is  inadequate for assessing PA pressure.   3. Left atrial size was mildly dilated.   4. The mitral valve is normal in structure. No evidence of mitral valve  regurgitation. No evidence of mitral stenosis.   5. The aortic valve was not well visualized. Aortic valve regurgitation  is not visualized. No aortic stenosis is present  7/28 tee  No endocarditis/icd wires vegetation  Raymondo Band, MD [facility] [phone] pager    01/20/2021, 2:16 PM

## 2021-01-20 NOTE — CV Procedure (Signed)
Procedure: TEE  Sedation: Per anesthesiology  Indication: Assess for endocarditis  Findings: Please see echo section for full report.  Severely dilated LV with normal wall thickness, diffuse hypokinesis with EF 15-20%.  The RV was moderately dilated with moderate systolic dysfunction.  Mildly dilated left atrium, no LA appendage thrombus.  Mildly dilated right atrium.  No ASD/PFO by color doppler.  Trivial TR, no TV vegetation.  No PV vegetation. ICD present in right heart, no vegetation.  Trivial MR, no MV vegetation.  Trileaflet aortic valve, no stenosis, trivial regurgitation.  No AoV vegetation.  Mild plaque descending thoracic aorta.   Impression: No evidence for endocarditis.   Marca Ancona 01/20/2021 8:04 AM

## 2021-01-20 NOTE — Anesthesia Preprocedure Evaluation (Signed)
Anesthesia Evaluation  Patient identified by MRN, date of birth, ID band Patient awake    Reviewed: Allergy & Precautions, H&P , NPO status , [name] Chart, lab work & pertinent test results  Airway Mallampati: II   Neck ROM: full    Dental   Pulmonary COPD, former smoker,    breath sounds clear to auscultation       Cardiovascular hypertension, + CAD, + Past MI and +CHF  + Cardiac Defibrillator  Rhythm:regular Rate:Normal     Neuro/Psych Anxiety    GI/Hepatic GERD  ,  Endo/Other  diabetes, Type 2  Renal/GU Renal InsufficiencyRenal disease     Musculoskeletal   Abdominal   Peds  Hematology   Anesthesia Other Findings   Reproductive/Obstetrics                             Anesthesia Physical Anesthesia Plan  ASA: 3  Anesthesia Plan: MAC   Post-op Pain Management:    Induction: Intravenous  PONV Risk Score and Plan: 1 and Propofol infusion and Treatment may vary due to age or medical condition  Airway Management Planned: Nasal Cannula  Additional Equipment:   Intra-op Plan:   Post-operative Plan:   Informed Consent: I have reviewed the patients History and Physical, chart, labs and discussed the procedure including the risks, benefits and alternatives for the proposed anesthesia with the patient or authorized representative who has indicated his/her understanding and acceptance.     Dental advisory given  Plan Discussed with: CRNA, Anesthesiologist and Surgeon  Anesthesia Plan Comments:         Anesthesia Quick Evaluation

## 2021-01-20 NOTE — Progress Notes (Signed)
Nutrition Follow-up  DOCUMENTATION CODES:   Obesity unspecified  INTERVENTION:   -Continue Glucerna Shake po BID, each supplement provides 220 kcal and 10 grams of protein  -Continue Ensure Max po daily, each supplement provides 150 kcal and 30 grams of protein -Continue MVI with minerals daily   NUTRITION DIAGNOSIS:   Increased nutrient needs related to wound healing as evidenced by estimated needs.  Ongoing  GOAL:   Patient will meet greater than or equal to 90% of their needs  Progressing   MONITOR:   PO intake, Supplement acceptance, Labs, Weight trends, Skin, I & O's  REASON FOR ASSESSMENT:   Consult Assessment of nutrition requirement/status  ASSESSMENT:   Richard [name] is a 60 y.o. male with medical history significant of DM2, COPD on chronic O2 2 L, systolic CHF sp AICD, venous stasis ulcer, HTN, CKD, pulmonary nodule (hamartoma) presented with increased SOB, no fever, no CP. Noted to have  Sp02 78% on RA with EMS. 4L [place] applied EMS gave 1 Neb, 125mg  Solumedrol IV and 4mg  Zofran IV. Pt remained on 4 L despite repeated neb treatments. Hx of severe COPD on O2 former smoker. Pt was hospitalized at [facility] in April of this year for CHF exacerbation requiring aggressive diuresis. Chronic leg wounds followed by wound care.  PCCM was consulted for worsening hypoxia and patient being on BiPAP.  Patient admitted for further management.  7/25- PICC placed 7/28- s/p TEE- negative for endocarditis  Reviewed I/O's: -4.8 L x 24 hours and -6.7 L since admission  UOP: 6 L x 24 hours  Case discussed with RN, who reports pt with good appetite and just returned from TEE.   Spoke with pt at bedside, who reports good appetite. He reports that intake has improved, but he is often unable to finish his meals due to multiple interruptions. Noted meal completions 50-100%. Pt is also consuming supplements and desires to continue these.   Medications reviewed and include colace,  prednisone, and senna.   Labs reviewed: CBGS: 137-190 (inpatient orders for glycemic control are 10 mg empagliflozin daily, 0-15 units insulin aspart daily at bedtime, 4 units insulin aspart TID with meals, and 10 units insulin glargine BID).    Diet Order:   Diet Order             Diet heart healthy/carb modified Room service appropriate? Yes; Fluid consistency: Thin  Diet effective now                   EDUCATION NEEDS:   Education needs have been addressed  Skin:  Skin Assessment: Skin Integrity Issues: Skin Integrity Issues:: Other (Comment) Other: full thickness wounds on LE and two areas on the left heel  Last BM:  01/18/21  Height:   Ht Readings from Last 1 Encounters:  01/16/21 6\' 5"  (1.956 m)    Weight:   Wt Readings from Last 1 Encounters:  01/20/21 115.8 kg    Ideal Body Weight:  94.5 kg  BMI:  Body mass index is 30.29 kg/m.  Estimated Nutritional Needs:   Kcal:  01/18/21  Protein:  140-155 grams  Fluid:  > 2 L    , RD, LDN, CDCES Registered Dietitian II Certified Diabetes Care and Education Specialist Please refer to [facility] for RD and/or RD on-call/weekend/after hours pager

## 2021-01-20 NOTE — Transfer of Care (Signed)
Immediate Anesthesia Transfer of Care Note  Patient: Richard [name]  Procedure(s) Performed: TRANSESOPHAGEAL ECHOCARDIOGRAM (TEE)  Patient Location: Endoscopy Unit  Anesthesia Type:MAC  Level of Consciousness: drowsy and patient cooperative  Airway & Oxygen Therapy: Patient Spontanous Breathing and Patient connected to nasal cannula oxygen  Post-op Assessment: Report given to RN and Post -op Vital signs reviewed and stable  Post vital signs: Reviewed and stable  Last Vitals:  Vitals Value Taken Time  BP 91/69   Temp    Pulse 79   Resp 22   SpO2 100     Last Pain:  Vitals:   01/20/21 0705  TempSrc: Oral  PainSc: 0-No pain         Complications: No notable events documented.

## 2021-01-20 NOTE — Consult Note (Signed)
ORTHOPAEDIC CONSULTATION  REQUESTING PHYSICIAN: Alba Cory, MD  Chief Complaint: Gangrenous ulcer left heel.  HPI: Richard [name] is a 60 y.o. male who presents with gangrenous necrotic ulcer left heel.  Patient has a history of heart failure diabetes peripheral vascular disease.  Patient states he has been having trouble with this ulcer for over a year.  He states he has been going to the wound center in [facility] weekly for the past 4 weeks.  He states that there was no indication that there is any problem with his foot.  Past Medical History:  Diagnosis Date   AICD (automatic cardioverter/defibrillator) present 01/11/2017   Anxiety    CHF (congestive heart failure) (HCC)    CKD (chronic kidney disease)    COPD (chronic obstructive pulmonary disease) (HCC)    never smoker, industrial exposure   Diabetes mellitus with diabetic neuropathy, with long-term current use of insulin (HCC)    GERD (gastroesophageal reflux disease)    High cholesterol    History of gout    "take RX qd" (11/01/2017)   Hypertension    Nonobstructive atherosclerosis of coronary artery    On home oxygen therapy    "2L prn" (11/01/2017)   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HCC)    LLL, present for years   Systolic HF (heart failure) (HCC)    Past Surgical History:  Procedure Laterality Date   CATARACT EXTRACTION W/ INTRAOCULAR LENS IMPLANTW/ TRABECULECTOMY Left ~ 2013   "may have done this when I had my other eye OR"   CIRCUMCISION     CORONARY STENT INTERVENTION N/A 11/02/2017   Procedure: CORONARY STENT INTERVENTION;  Surgeon: Laurey Morale, MD;  Location: [facility] INVASIVE CV LAB;  Service: Cardiovascular;  Laterality: N/A;   CORONARY STENT INTERVENTION N/A 11/02/2017   Procedure: CORONARY STENT INTERVENTION;  Surgeon: Lyn Records, MD;  Location: [facility] INVASIVE CV LAB;  Service: Cardiovascular;  Laterality: N/A;   EYE SURGERY Left ~ 2013   "fell on something in basement; stuck in my eye"   ICD IMPLANT      Medtronic Dual Chamber 01/11/17   MULTIPLE TOOTH EXTRACTIONS     "pulled all my top teeth"   RIGHT/LEFT HEART CATH AND CORONARY ANGIOGRAPHY N/A 11/02/2017   Procedure: RIGHT/LEFT HEART CATH AND CORONARY ANGIOGRAPHY;  Surgeon: Laurey Morale, MD;  Location: [facility] INVASIVE CV LAB;  Service: Cardiovascular;  Laterality: N/A;   TEE WITHOUT CARDIOVERSION N/A 01/20/2021   Procedure: TRANSESOPHAGEAL ECHOCARDIOGRAM (TEE);  Surgeon: Laurey Morale, MD;  Location: [facility] ENDOSCOPY;  Service: Cardiovascular;  Laterality: N/A;   Social History   Socioeconomic History   Marital status: Divorced    Spouse name: Not on file   Number of children: Not on file   Years of education: Not on file   Highest education level: Not on file  Occupational History   Occupation: disability    Comment: stopped working in 2000 d/t occupational exposures  Tobacco Use   Smoking status: Former    Types: Cigarettes   Smokeless tobacco: Never   Tobacco comments:    "smoked when I drank"  Vaping Use   Vaping Use: Never used  Substance and Sexual Activity   Alcohol use: Not Currently    Comment: 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  Other Topics Concern   Not on file  Social History Narrative   Patient lives at home with  wife and some of his 10 children. He self-administers his own medications.   Social Determinants of Health   Financial Resource Strain: Low Risk    Difficulty of Paying Living Expenses: Not very hard  Food Insecurity: No Food Insecurity   Worried About Programme researcher, broadcasting/film/video in the Last Year: Never true   Ran Out of Food in the Last Year: Never true  Transportation Needs: No Transportation Needs   Lack of Transportation (Medical): No   Lack of Transportation (Non-Medical): No  Physical Activity: Not on file  Stress: Not on file  Social Connections: Not on file   Family History  Problem Relation Age of Onset   Hypertension  Mother    Diabetes Mother    Hypertension Father    Diabetes Father    Diabetes Sister    Diabetes Brother    - negative except otherwise stated in the family history section Allergies  Allergen Reactions   Aspirin Itching   Codeine Hives   Coconut Oil Itching   Prior to Admission medications   Medication Sig Start Date End Date Taking? Authorizing Provider  acetaminophen (TYLENOL) 650 MG CR tablet Take 650 mg by mouth every 8 (eight) hours as needed for pain.   Yes [provider]  albuterol (VENTOLIN HFA) 108 (90 Base) MCG/ACT inhaler Inhale 1-2 puffs into the lungs every 4 (four) hours as needed for wheezing or shortness of breath. 10/17/20  Yes Alessandra Bevels, MD  allopurinol (ZYLOPRIM) 100 MG tablet Take 1 tablet (100 mg total) by mouth daily. 12/26/17  Yes Clegg, Amy D, NP  aspirin 81 MG chewable tablet Chew 81 mg by mouth daily.   Yes [provider]  atorvastatin (LIPITOR) 40 MG tablet TAKE ONE TABLET BY MOUTH DAILY AT 6PM Patient taking differently: Take 40 mg by mouth daily. 11/15/20  Yes Laurey Morale, MD  bisoprolol (ZEBETA) 5 MG tablet Take 2.5 mg by mouth daily.   Yes [provider]  BREO ELLIPTA 100-25 MCG/INH AEPB Inhale 1 puff into the lungs daily. 01/03/21  Yes [provider]  busPIRone (BUSPAR) 5 MG tablet Take 5 mg by mouth 2 (two) times daily.   Yes [provider]  docusate sodium (COLACE) 100 MG capsule Take 100 mg by mouth daily as needed for mild constipation.   Yes [provider]  DULoxetine (CYMBALTA) 60 MG capsule Take 60 mg by mouth daily. 09/28/17  Yes [provider]  eplerenone (INSPRA) 50 MG tablet Take 1 tablet (50 mg total) by mouth daily. 07/27/20  Yes Laurey Morale, MD  fenofibrate (TRICOR) 145 MG tablet TAKE ONE TABLET BY MOUTH DAILY Patient taking differently: Take 145 mg by mouth daily. 12/15/20  Yes Laurey Morale, MD  hydrALAZINE (APRESOLINE) 50 MG tablet Take 1.5 tablets (75 mg  total) by mouth 3 (three) times daily. 09/09/20  Yes Laurey Morale, MD  insulin regular human CONCENTRATED (HUMULIN R) 500 UNIT/ML kwikpen Inject 0-400 Units into the skin as directed. Sliding 07/18/17  Yes [provider]  JARDIANCE 25 MG TABS tablet Take 25 mg by mouth daily. 10/15/19  Yes Laurey Morale, MD  MAGNESIUM-OXIDE 400 (241.3 Mg) MG tablet Take 1 tablet (400 mg total) by mouth daily. 08/27/20  Yes Laurey Morale, MD  pregabalin (LYRICA) 150 MG capsule Take 150 mg by mouth 3 (three) times daily. 10/07/20  Yes [provider]  sacubitril-valsartan (ENTRESTO) 97-103 MG Take 1 tablet by mouth 2 (two) times daily. 03/17/20  Yes Laurey Morale, MD  SSD 1 % cream Apply 1 application topically daily as needed for wound care. 12/20/20  Yes [provider]  Torsemide 40 MG TABS Take 80 mg by mouth in the morning AND 60 mg every evening. 12/07/20  Yes Laurey Morale, MD  digoxin (LANOXIN) 0.125 MG tablet TAKE ONE TABLET BY MOUTH DAILY Must keep further appointments for refills 01/17/21   Laurey Morale, MD  icosapent Ethyl (VASCEPA) 1 g capsule Take 2 capsules (2 g total) by mouth 2 (two) times daily. Patient not taking: No sig reported 10/08/20   Laurey Morale, MD  Insulin Pen Needle (NOVOFINE) 30G X 8 MM MISC Inject 10 each into the skin as needed. 11/06/17   Arnetha Courser, MD  isosorbide dinitrate (ISORDIL) 20 MG tablet TAKE TWO TABLETS BY MOUTH THREE TIMES A DAY 12/15/20   Laurey Morale, MD  omeprazole (PRILOSEC) 20 MG capsule TAKE ONE CAPSULE BY MOUTH DAILY Patient not taking: No sig reported 10/20/20   Laurey Morale, MD   No results found. - pertinent xrays, CT, MRI studies were reviewed and independently interpreted  Positive ROS: All other systems have been reviewed and were otherwise negative with the exception of those mentioned in the HPI and as above.  Physical Exam: General: Alert, no acute distress Psychiatric: Patient is competent for consent  with normal mood and affect Lymphatic: No axillary or cervical lymphadenopathy Cardiovascular: No pedal edema Respiratory: No cyanosis, no use of accessory musculature GI: No organomegaly, abdomen is soft and non-tender    Images:  @ENCIMAGES @  Labs:  Lab Results  Component Value Date   HGBA1C 10.3 (H) 01/17/2021   HGBA1C 10.4 (H) 10/31/2017   LABURIC 8.2 09/22/2019   REPTSTATUS PENDING 01/16/2021   CULT  01/16/2021    NO GROWTH 4 DAYS Performed at [facility], [address], [place], [place] [address]    [facility] STREPTOCOCCUS GROUP G 01/15/2021    Lab Results  Component Value Date   ALBUMIN 2.1 (L) 01/20/2021   ALBUMIN 2.2 (L) 01/19/2021   ALBUMIN 2.2 (L) 01/18/2021   PREALBUMIN 8.5 (L) 01/16/2021   LABURIC 8.2 09/22/2019     CBC EXTENDED Latest Ref Rng & Units 01/20/2021 01/19/2021 01/18/2021  WBC 4.0 - 10.5 K/uL 34.1(H) 29.3(H) 23.1(H)  RBC 4.22 - 5.81 MIL/uL 5.60 5.77 5.84(H)  HGB 13.0 - 17.0 g/dL 72.0 94.7 17.1(H)  HCT 39.0 - 52.0 % 49.1 50.8 51.8  PLT 150 - 400 K/uL 255 323 348  NEUTROABS 1.7 - 7.7 K/uL 29.9(H) 25.6(H) 19.6(H)  LYMPHSABS 0.7 - 4.0 K/uL 1.3 1.0 1.1    Neurologic: Patient does not have protective sensation bilateral lower extremities.   MUSCULOSKELETAL:   Skin: Examination patient has a black necrotic foul-smelling ulcer involving the entire heel pad.   He does have a thready palpable pulse bilaterally.    Review of the CT scan shows air in the soft tissue involving the entire heel pad.  There is periarticular cystic changes and the bones of the foot consistent with chronic gout.    Patient is septic with a white cell count of 34.1 he has forced expiratory breathing on nasal cannula oxygen.  [name] albumin is 2.1.  Hemoglobin A1c consistently elevated above 10.  Hemoglobin 16.1.  Examination the right foot he has a ulcer over the medial MTP joint of the great toe.  This is concerning as well for a deep  infection.  Assessment: Assessment: Uncontrolled type  2 diabetes with severe protein caloric malnutrition with peripheral vascular disease with gangrene and necrotic abscess involving the left heel with a small ulcer to the right foot great toe MTP joint with venous insufficiency ulcer to the right leg.  Plan: I discussed with the patient and his wife [name] on the phone that patient will require a below the knee amputation on the left may require surgery on the right.  Discussed that he is septic and urgent surgery is necessary.  Patient expressed concerns that he was not told by the wound center in [facility] that there was anything wrong with his foot.  Plan for left below the knee amputation tomorrow Friday.  Thank you for the consult and the opportunity to see Mr. Abdon Petrosky, MD [facility] [phone] 6:42 PM

## 2021-01-20 NOTE — Progress Notes (Signed)
PT Cancellation Note  Patient Details Name: Richard [name] MRN: [id number] DOB: Mar 01, 1961   Cancelled Treatment:    Reason Eval/Treat Not Completed: Patient declined, no reason specified. Pt refuses PT treatment, reporting that he already ambulated earlier in the morning. PT provides education on the need for multiple bouts of ambulation during the day yet pt continues to decline. PT will follow up as time allows.   Arlyss Gandy 01/20/2021, 3:49 PM

## 2021-01-20 NOTE — Anesthesia Postprocedure Evaluation (Signed)
Anesthesia Post Note  Patient: Richard [name]  Procedure(s) Performed: TRANSESOPHAGEAL ECHOCARDIOGRAM (TEE)     Patient location during evaluation: Endoscopy Anesthesia Type: MAC Level of consciousness: awake and alert Pain management: pain level controlled Vital Signs Assessment: post-procedure vital signs reviewed and stable Respiratory status: spontaneous breathing, nonlabored ventilation, respiratory function stable and patient connected to nasal cannula oxygen Cardiovascular status: stable and blood pressure returned to baseline Postop Assessment: no apparent nausea or vomiting Anesthetic complications: no   No notable events documented.  Last Vitals:  Vitals:   01/20/21 0830 01/20/21 0840  BP: (!) 128/91 118/90  Pulse:    Resp:    Temp:    SpO2:  95%    Last Pain:  Vitals:   01/20/21 0840  TempSrc:   PainSc: 0-No pain                 Gregorey Nabor S

## 2021-01-20 NOTE — Progress Notes (Signed)
PROGRESS NOTE    Richard GuardRickey [name]  RUE:[id number] DOB: August 30, 1960 DOA: 01/15/2021 PCP: Ananias PilgrimAsres, Alehegn, MD   Brief Narrative: 60 year old with past medical history significant for diabetes type 2, COPD on chronic oxygen 2 L, systolic heart failure status post AICD, venous stasis ulcer, hypertension, CKD, pulmonary nodule Richard [name] Richard [name] patient with increased shortness of breath, no fever no chest pain.  Noted to have oxygen saturation 87 on room air with EMS.  4 L [place] applied by EMS and gave 1 nebulizer, Solu-Medrol.  CCM was consulted for worsening hypoxia and patient being placed on BiPAP.  Patient was admitted for further management.    Assessment & Plan:   Principal Problem:   Acute on chronic respiratory failure with hypoxia and hypercapnia (HCC) Active Problems:   Hypertension   CKD (chronic kidney disease)   Presence of implantable cardioverter-defibrillator (ICD)   GERD (gastroesophageal reflux disease)   Diabetes mellitus with diabetic neuropathy, with long-term current use of insulin (HCC)   COPD exacerbation (HCC)   Chronic systolic CHF (congestive heart failure) (HCC)   Acute renal failure superimposed on stage 3 chronic kidney disease (HCC)   Elevated troponin   Hyponatremia   Bacteremia   Chronic ulcer of left ankle (HCC)   Severe sepsis without septic shock (HCC)   Open wound  1-Acute on Chronic Hypoxic Respiratory Failure, acute on chronic combined heart failure, AICD -Patient required BiPAP on admission.  Currently on nasal cannula oxygen for mentation Elevation of BNP and flat troponin.  Chest x-ray with mild vascular congestion. -Echo showed ejection fraction 20 to 25% -Neurology consulted appreciate recommendation. -Continue with hydralazine, Imdur and torsemide, spironolactone .  -Bisoprolol, digoxin and Entresto on hold -stable. Plan to hold torsemide today.   Severe sepsis likely secondary to group G bacteremia Likely secondary to chronic left heel unstageable  ulcer.  -Patient tachycardic, tachypneic L a more than 2 leukocytosis -Cultures x2 gross strep group G, Staphylococcus  -Repeated blood culture 7/24: No growth to date -ID Following and recommend TEE. TEE negative -Might need orthopedic or vascular consult -ABI results in chart.  -Dr Lajoyce Cornersuda consulted. CT ordered.  -Continue with IV penicillin G  Possible COPD exacerbation Azithromycin, On prednisone.  continue with  nebulizer treatments  Diabetes type 2: Continue with Lantus and sliding scale insulin A1c 10  AKI on CKD stage III: Baseline creatinine 1.5  CAD/HLD Status post PCI With aspirin and statin  Chronic venous stasis ulcer: Status postdebridement of left foot wound on 01/13/2021 at OSH Wound care following.  Hiccup:  Started baclofen, change PPI to twice daily   Nutrition Problem: Increased nutrient needs Etiology: wound healing    Signs/Symptoms: estimated needs    Interventions: Glucerna shake, MVI, Premier Protein  Estimated body mass index is 30.29 kg/m as calculated from the following:   Height as of this encounter: 6\' 5"  (1.956 m).   Weight as of this encounter: 115.8 kg.   DVT prophylaxis: Heparin Code Status: Full code Family Communication: Care discussed with patient Disposition Plan:  Status is: Inpatient  Remains inpatient appropriate because:IV treatments appropriate due to intensity of illness or inability to take PO  Dispo: The patient is from: Home              Anticipated d/c is to: Home              Patient currently is not medically stable to d/c.   Difficult to place patient No        Consultants:  Cardiology ID  Procedures:    Antimicrobials:    Subjective: He is complaining of hiccup Denies worsening dyspnea.  Agrees to have x ray done.   Objective: Vitals:   01/20/21 0810 01/20/21 0820 01/20/21 0830 01/20/21 0840  BP: 92/68 106/79 (!) 128/91 118/90  Pulse: 81     Resp:      Temp:      TempSrc:       SpO2: 100% 91%  95%  Weight:      Height:        Intake/Output Summary (Last 24 hours) at 01/20/2021 0953 Last data filed at 01/20/2021 0929 Gross per 24 hour  Intake 1365.15 ml  Output 6200 ml  Net -4834.85 ml    Filed Weights   01/18/21 0548 01/19/21 0416 01/20/21 0400  Weight: 117.5 kg 117.1 kg 115.8 kg    Examination:  General exam: NAD Respiratory system: BL crackles.  Cardiovascular system: S 1, S 2 RRR Gastrointestinal system: BS present, soft, nt Central nervous system: Alert and oriented  Extremities: Lower extremity with Unna boot dressing  Data Reviewed: I have personally reviewed following labs and imaging studies  CBC: Recent Labs  Lab 01/16/21 0323 01/17/21 0538 01/18/21 0325 01/19/21 0500 01/20/21 0357  WBC 24.8* 26.8* 23.1* 29.3* 34.1*  NEUTROABS 22.5* 23.9* 19.6* 25.6* 29.9*  HGB 16.5 17.0 17.1* 16.3 16.1  HCT 51.3 53.5* 51.8 50.8 49.1  MCV 89.2 88.6 88.7 88.0 87.7  PLT 295 346 348 323 255    Basic Metabolic Panel: Recent Labs  Lab 01/15/21 2307 01/16/21 0323 01/17/21 0538 01/18/21 0325 01/19/21 0500 01/19/21 0634 01/20/21 0357  NA  --  135 135 135 129* 133* 131*  K  --  5.2* 4.9 4.5 >7.5* 4.6 4.5  CL  --  93* 97* 95* 90* 95* 89*  CO2  --  27 28 29 30 30 30   GLUCOSE  --  454* 248* 178* 253* 144* 181*  BUN  --  77* 82* 75* 65* 64* 63*  CREATININE  --  2.37* 1.87* 1.64* 1.57* 1.46* 1.75*  CALCIUM  --  8.9 8.7* 8.7* 8.5* 8.7* 8.7*  MG 2.5* 2.7*  --   --   --   --   --   PHOS 6.2* 6.9*  --   --   --   --   --     GFR: Estimated Creatinine Clearance: 64.9 mL/min (A) (by C-G formula based on SCr of 1.75 mg/dL (H)). Liver Function Tests: Recent Labs  Lab 01/16/21 0323 01/17/21 0538 01/18/21 0325 01/19/21 0500 01/20/21 0357  AST 18 20 18 26 21   ALT 20 22 19 25 22   ALKPHOS 78 71 63 70 65  BILITOT 1.2 0.7 0.9 1.4* 1.4*  PROT 7.6 7.4 7.9 8.1 7.9  ALBUMIN 2.1* 2.0* 2.2* 2.2* 2.1*    No results for input(s): LIPASE, AMYLASE in  the last 168 hours. No results for input(s): AMMONIA in the last 168 hours. Coagulation Profile: No results for input(s): INR, PROTIME in the last 168 hours. Cardiac Enzymes: Recent Labs  Lab 01/15/21 2307  CKTOTAL 101    BNP (last 3 results) No results for input(s): PROBNP in the last 8760 hours. HbA1C: No results for input(s): HGBA1C in the last 72 hours.  CBG: Recent Labs  Lab 01/19/21 1143 01/19/21 1655 01/19/21 2037 01/20/21 0536 01/20/21 0935  GLUCAP 141* 252* 225* 180* 137*    Lipid Profile: No results for input(s): CHOL, HDL, LDLCALC, TRIG, CHOLHDL, LDLDIRECT in  the last 72 hours. Thyroid Function Tests: No results for input(s): TSH, T4TOTAL, FREET4, T3FREE, THYROIDAB in the last 72 hours. Anemia Panel: No results for input(s): VITAMINB12, FOLATE, FERRITIN, TIBC, IRON, RETICCTPCT in the last 72 hours. Sepsis Labs: Recent Labs  Lab 01/15/21 1805 01/15/21 2307 01/16/21 0323 01/17/21 0538 01/18/21 0325  PROCALCITON  --  12.40  --  6.72 4.37  LATICACIDVEN 2.1* 1.8 2.1* 1.8  --      Recent Results (from the past 240 hour(s))  Culture, blood (routine x 2)     Status: None (Preliminary result)   Collection Time: 01/15/21  3:41 PM   Specimen: BLOOD RIGHT HAND  Result Value Ref Range Status   Specimen Description BLOOD RIGHT HAND  Final   Special Requests   Final    BOTTLES DRAWN AEROBIC AND ANAEROBIC Blood Culture adequate volume   Culture   Final    NO GROWTH 4 DAYS Performed at [facility], [address] [place], [place] [address]    Report Status PENDING  Incomplete  Resp Panel by RT-PCR (Flu A&B, Covid) Nasopharyngeal Swab     Status: None   Collection Time: 01/15/21  3:45 PM   Specimen: Nasopharyngeal Swab; Nasopharyngeal(NP) swabs in vial transport medium  Result Value Ref Range Status   SARS Coronavirus 2 by RT PCR NEGATIVE NEGATIVE Final    Comment: (NOTE) SARS-CoV-2 target nucleic acids are NOT DETECTED.  The SARS-CoV-2 RNA is  generally detectable in upper respiratory specimens during the acute phase of infection. The lowest concentration of SARS-CoV-2 viral copies this assay can detect is 138 copies/mL. A negative result does not preclude SARS-Cov-2 infection and should not be used as the sole basis for treatment or other patient management decisions. A negative result may occur with  improper specimen collection/handling, submission of specimen other than nasopharyngeal swab, presence of viral mutation(s) within the areas targeted by this assay, and inadequate number of viral copies(<138 copies/mL). A negative result must be combined with clinical observations, patient history, and epidemiological information. The expected result is Negative.  Fact Sheet for Patients:  BloggerCourse.com  Fact Sheet for Healthcare Providers:  SeriousBroker.it  This test is no t yet approved or cleared by the [place] FDA and  has been authorized for detection and/or diagnosis of SARS-CoV-2 by FDA under an Emergency Use Authorization (EUA). This EUA will remain  in effect (meaning this test can be used) for the duration of the COVID-19 declaration under Section 564(b)(1) of the Act, 21 U.S.C.section 360bbb-3(b)(1), unless the authorization is terminated  or revoked sooner.       Influenza A by PCR NEGATIVE NEGATIVE Final   Influenza B by PCR NEGATIVE NEGATIVE Final    Comment: (NOTE) The Xpert Xpress SARS-CoV-2/FLU/RSV plus assay is intended as an aid in the diagnosis of influenza from Nasopharyngeal swab specimens and should not be used as a sole basis for treatment. Nasal washings and aspirates are unacceptable for Xpert Xpress SARS-CoV-2/FLU/RSV testing.  Fact Sheet for Patients: BloggerCourse.com  Fact Sheet for Healthcare Providers: SeriousBroker.it  This test is not yet approved or cleared by the [place] FDA and has been authorized for detection and/or diagnosis of SARS-CoV-2 by FDA under an Emergency Use Authorization (EUA). This EUA will remain in effect (meaning this test can be used) for the duration of the COVID-19 declaration under Section 564(b)(1) of the Act, 21 U.S.C. section 360bbb-3(b)(1), unless the authorization is terminated or revoked.  Performed at [facility],  [address] [place], [place] [address]   Culture, blood (routine x 2)     Status: Abnormal   Collection Time: 01/15/21  6:05 PM   Specimen: BLOOD LEFT FOREARM  Result Value Ref Range Status   Specimen Description BLOOD LEFT FOREARM  Final   Special Requests   Final    BOTTLES DRAWN AEROBIC AND ANAEROBIC Blood Culture adequate volume   Culture  Setup Time   Final    GRAM POSITIVE COCCI IN CHAINS AEROBIC BOTTLE ONLY CRITICAL RESULT CALLED TO, READ BACK BY AND VERIFIED WITH: PHARM D K.HAMMONS ON [id number] AT 0935 BY E.PARRISH    Culture (A)  Final    STREPTOCOCCUS GROUP G STAPHYLOCOCCUS HOMINIS CRITICAL RESULT CALLED TO, READ BACK BY AND VERIFIED WITH: PHARMD JEREMY F. [id number] FCP THE SIGNIFICANCE OF ISOLATING THIS ORGANISM FROM A SINGLE VENIPUNCTURE CANNOT BE PREDICTED WITHOUT FURTHER CLINICAL AND CULTURE CORRELATION. SUSCEPTIBILITIES AVAILABLE ONLY ON REQUEST. Performed at [facility], [address], [place], [place] [address]    Report Status 01/19/2021 FINAL  Final   Organism ID, Bacteria STREPTOCOCCUS GROUP G  Final      Susceptibility   Streptococcus group g - MIC*    CLINDAMYCIN RESISTANT Resistant     AMPICILLIN <=0.25 SENSITIVE Sensitive     ERYTHROMYCIN 4 RESISTANT Resistant     VANCOMYCIN 0.5 SENSITIVE Sensitive     CEFTRIAXONE <=0.12 SENSITIVE Sensitive     LEVOFLOXACIN 1 SENSITIVE Sensitive     PENICILLIN Value in next row Sensitive      SENSITIVEMIC <=0.06    * STREPTOCOCCUS GROUP G  Blood Culture ID Panel (Reflexed)     Status: Abnormal   Collection Time:  01/15/21  6:05 PM  Result Value Ref Range Status   Enterococcus faecalis NOT DETECTED NOT DETECTED Final   Enterococcus Faecium NOT DETECTED NOT DETECTED Final   Listeria monocytogenes NOT DETECTED NOT DETECTED Final   Staphylococcus species DETECTED (A) NOT DETECTED Final    Comment: CRITICAL RESULT CALLED TO, READ BACK BY AND VERIFIED WITH: PHARM D K.HAMMONS ON [id number] AT 0935 BY E.PARRISH    Staphylococcus aureus (BCID) NOT DETECTED NOT DETECTED Final   Staphylococcus epidermidis NOT DETECTED NOT DETECTED Final   Staphylococcus lugdunensis NOT DETECTED NOT DETECTED Final   Streptococcus species DETECTED (A) NOT DETECTED Final    Comment: Not Enterococcus species, Streptococcus agalactiae, Streptococcus pyogenes, or Streptococcus pneumoniae. CRITICAL RESULT CALLED TO, READ BACK BY AND VERIFIED WITH: PHARM D K.HAMMONS ON [id number] AT 0935 BY E.PARRISH    Streptococcus agalactiae NOT DETECTED NOT DETECTED Final   Streptococcus pneumoniae NOT DETECTED NOT DETECTED Final   Streptococcus pyogenes NOT DETECTED NOT DETECTED Final   A.calcoaceticus-baumannii NOT DETECTED NOT DETECTED Final   Bacteroides fragilis NOT DETECTED NOT DETECTED Final   Enterobacterales NOT DETECTED NOT DETECTED Final   Enterobacter cloacae complex NOT DETECTED NOT DETECTED Final   Escherichia coli NOT DETECTED NOT DETECTED Final   Klebsiella aerogenes NOT DETECTED NOT DETECTED Final   Klebsiella oxytoca NOT DETECTED NOT DETECTED Final   Klebsiella pneumoniae NOT DETECTED NOT DETECTED Final   Proteus species NOT DETECTED NOT DETECTED Final   Salmonella species NOT DETECTED NOT DETECTED Final   Serratia marcescens NOT DETECTED NOT DETECTED Final   Haemophilus influenzae NOT DETECTED NOT DETECTED Final   Neisseria meningitidis NOT DETECTED NOT DETECTED Final   Pseudomonas aeruginosa NOT DETECTED NOT DETECTED Final   Stenotrophomonas maltophilia NOT DETECTED NOT DETECTED Final   Candida albicans  NOT DETECTED NOT  DETECTED Final   Candida auris NOT DETECTED NOT DETECTED Final   Candida glabrata NOT DETECTED NOT DETECTED Final   Candida krusei NOT DETECTED NOT DETECTED Final   Candida parapsilosis NOT DETECTED NOT DETECTED Final   Candida tropicalis NOT DETECTED NOT DETECTED Final   Cryptococcus neoformans/gattii NOT DETECTED NOT DETECTED Final    Comment: Performed at [facility], [address], [place], [place] [address]  MRSA Next Gen by PCR, Nasal     Status: Abnormal   Collection Time: 01/15/21 10:38 PM   Specimen: Nasal Mucosa; Nasal Swab  Result Value Ref Range Status   MRSA by PCR Next Gen DETECTED (A) NOT DETECTED Final    Comment: RESULT CALLED TO, READ BACK BY AND VERIFIED WITH: RN M.SHULL ON [id number] AT 1121 BY E.PARRISH (NOTE) The GeneXpert MRSA Assay (FDA approved for NASAL specimens only), is one component of a comprehensive MRSA colonization surveillance program. It is not intended to diagnose MRSA infection nor to guide or monitor treatment for MRSA infections. Test performance is not FDA approved in patients less than 67 years old. Performed at [name], [address], [place], [place] [address]   Culture, blood (routine x 2)     Status: None (Preliminary result)   Collection Time: 01/16/21 11:38 AM   Specimen: BLOOD  Result Value Ref Range Status   Specimen Description BLOOD RIGHT ANTECUBITAL  Final   Special Requests   Final    BOTTLES DRAWN AEROBIC ONLY Blood Culture adequate volume   Culture   Final    NO GROWTH 3 DAYS Performed at [facility], [address], [place], [place] [address]    Report Status PENDING  Incomplete  Culture, blood (routine x 2)     Status: None (Preliminary result)   Collection Time: 01/16/21 11:39 AM   Specimen: BLOOD RIGHT HAND  Result Value Ref Range Status   Specimen Description BLOOD RIGHT HAND  Final   Special Requests   Final    BOTTLES DRAWN AEROBIC ONLY Blood Culture results may not be optimal due to an  inadequate volume of blood received in culture bottles   Culture   Final    NO GROWTH 3 DAYS Performed at [facility], [address], [place], [place] [address]    Report Status PENDING  Incomplete          Radiology Studies: No results found.      Scheduled Meds:  allopurinol  100 mg Oral Daily   aspirin EC  81 mg Oral Daily   atorvastatin  40 mg Oral Daily   Chlorhexidine Gluconate Cloth  6 each Topical Daily   docusate sodium  100 mg Oral BID   empagliflozin  10 mg Oral Daily   feeding supplement (GLUCERNA SHAKE)  237 mL Oral BID BM   heparin  5,000 Units Subcutaneous Q8H   hydrALAZINE  10 mg Oral Q8H   insulin aspart  0-15 Units Subcutaneous TID WC   insulin aspart  0-5 Units Subcutaneous QHS   insulin aspart  4 Units Subcutaneous TID WC   insulin glargine  10 Units Subcutaneous BID   ipratropium-albuterol  3 mL Nebulization BID   isosorbide mononitrate  30 mg Oral Daily   multivitamin with minerals  1 tablet Oral Daily   mupirocin ointment  1 application Nasal BID   pantoprazole  40 mg Oral BID   predniSONE  40 mg Oral Q breakfast  Ensure Max Protein  11 oz Oral QHS   senna  1 tablet Oral BID   sodium chloride flush  10-40 mL Intracatheter Q12H   spironolactone  12.5 mg Oral Daily   Continuous Infusions:  penicillin g continuous IV infusion 12 Million Units (01/19/21 2118)     LOS: 5 days    Time spent: 35 minutes.     Alba Cory, MD [facility]   If 7PM-7AM, please contact night-coverage [email or web address]  01/20/2021, 9:53 AM

## 2021-01-21 ENCOUNTER — Inpatient Hospital Stay (HOSPITAL_COMMUNITY): Payer: Medicare Other | Admitting: Anesthesiology

## 2021-01-21 ENCOUNTER — Encounter (HOSPITAL_COMMUNITY): Payer: Self-pay | Admitting: Internal Medicine

## 2021-01-21 ENCOUNTER — Encounter (HOSPITAL_COMMUNITY)
Admission: EM | Disposition: A | Discharge status: Rehabilitation Facility | Payer: Self-pay | Source: Home / Self Care | Attending: Internal Medicine

## 2021-01-21 DIAGNOSIS — J9621 Acute and chronic respiratory failure with hypoxia: Secondary | ICD-10-CM | POA: Diagnosis not present

## 2021-01-21 DIAGNOSIS — L97324 Non-pressure chronic ulcer of left ankle with necrosis of bone: Secondary | ICD-10-CM

## 2021-01-21 DIAGNOSIS — J9622 Acute and chronic respiratory failure with hypercapnia: Secondary | ICD-10-CM | POA: Diagnosis not present

## 2021-01-21 DIAGNOSIS — I5022 Chronic systolic (congestive) heart failure: Secondary | ICD-10-CM | POA: Diagnosis not present

## 2021-01-21 DIAGNOSIS — A419 Sepsis, unspecified organism: Secondary | ICD-10-CM | POA: Diagnosis not present

## 2021-01-21 HISTORY — PX: AMPUTATION: SHX166

## 2021-01-21 HISTORY — PX: APPLICATION OF WOUND VAC: SHX5189

## 2021-01-21 LAB — CBC WITH DIFFERENTIAL/PLATELET
Abs Immature Granulocytes: 0.85 10*3/uL — ABNORMAL HIGH (ref 0.00–0.07)
Basophils Absolute: 0.1 10*3/uL (ref 0.0–0.1)
Basophils Relative: 0 %
Eosinophils Absolute: 0 10*3/uL (ref 0.0–0.5)
Eosinophils Relative: 0 %
HCT: 45.4 % (ref 39.0–52.0)
Hemoglobin: 14.9 g/dL (ref 13.0–17.0)
Immature Granulocytes: 3 %
Lymphocytes Relative: 4 %
Lymphs Abs: 1.2 10*3/uL (ref 0.7–4.0)
MCH: 28.6 pg (ref 26.0–34.0)
MCHC: 32.8 g/dL (ref 30.0–36.0)
MCV: 87.1 fL (ref 80.0–100.0)
Monocytes Absolute: 2.1 10*3/uL — ABNORMAL HIGH (ref 0.1–1.0)
Monocytes Relative: 7 %
Neutro Abs: 26.4 10*3/uL — ABNORMAL HIGH (ref 1.7–7.7)
Neutrophils Relative %: 86 %
Platelets: 313 10*3/uL (ref 150–400)
RBC: 5.21 MIL/uL (ref 4.22–5.81)
RDW: 18.7 % — ABNORMAL HIGH (ref 11.5–15.5)
WBC: 30.7 10*3/uL — ABNORMAL HIGH (ref 4.0–10.5)
nRBC: 0 % (ref 0.0–0.2)

## 2021-01-21 LAB — GLUCOSE, CAPILLARY
Glucose-Capillary: 159 mg/dL — ABNORMAL HIGH (ref 70–99)
Glucose-Capillary: 92 mg/dL (ref 70–99)
Glucose-Capillary: 92 mg/dL (ref 70–99)
Glucose-Capillary: 70 mg/dL (ref 70–99)
Glucose-Capillary: 249 mg/dL — ABNORMAL HIGH (ref 70–99)
Glucose-Capillary: 185 mg/dL — ABNORMAL HIGH (ref 70–99)

## 2021-01-21 LAB — COMPREHENSIVE METABOLIC PANEL
ALT: 24 U/L (ref 0–44)
AST: 24 U/L (ref 15–41)
Albumin: 2 g/dL — ABNORMAL LOW (ref 3.5–5.0)
Alkaline Phosphatase: 83 U/L (ref 38–126)
Anion gap: 10 (ref 5–15)
BUN: 57 mg/dL — ABNORMAL HIGH (ref 6–20)
CO2: 30 mmol/L (ref 22–32)
Calcium: 8.7 mg/dL — ABNORMAL LOW (ref 8.9–10.3)
Chloride: 87 mmol/L — ABNORMAL LOW (ref 98–111)
Creatinine, Ser: 1.57 mg/dL — ABNORMAL HIGH (ref 0.61–1.24)
GFR, Estimated: 51 mL/min — ABNORMAL LOW (ref >60)
Glucose, Bld: 260 mg/dL — ABNORMAL HIGH (ref 70–99)
Potassium: 7.5 mmol/L (ref 3.5–5.1)
Sodium: 127 mmol/L — ABNORMAL LOW (ref 135–145)
Total Bilirubin: 0.7 mg/dL (ref 0.3–1.2)
Total Protein: 7.9 g/dL (ref 6.5–8.1)

## 2021-01-21 LAB — BASIC METABOLIC PANEL
Anion gap: 7 (ref 5–15)
BUN: 57 mg/dL — ABNORMAL HIGH (ref 6–20)
CO2: 32 mmol/L (ref 22–32)
Calcium: 8.9 mg/dL (ref 8.9–10.3)
Chloride: 92 mmol/L — ABNORMAL LOW (ref 98–111)
Creatinine, Ser: 1.45 mg/dL — ABNORMAL HIGH (ref 0.61–1.24)
GFR, Estimated: 56 mL/min — ABNORMAL LOW (ref >60)
Glucose, Bld: 160 mg/dL — ABNORMAL HIGH (ref 70–99)
Potassium: 4.7 mmol/L (ref 3.5–5.1)
Sodium: 131 mmol/L — ABNORMAL LOW (ref 135–145)

## 2021-01-21 LAB — COOXEMETRY PANEL
Carboxyhemoglobin: 1.1 % (ref 0.5–1.5)
Methemoglobin: 1.1 % (ref 0.0–1.5)
O2 Saturation: 76.4 %
Total hemoglobin: 16 g/dL (ref 12.0–16.0)

## 2021-01-21 LAB — SURGICAL PCR SCREEN
MRSA, PCR: NEGATIVE
Staphylococcus aureus: POSITIVE — AB

## 2021-01-21 SURGERY — AMPUTATION BELOW KNEE
Anesthesia: General | Site: Knee | Laterality: Left

## 2021-01-21 MED ORDER — POVIDONE-IODINE 10 % EX SWAB: 2.0000 "application " | Freq: Once | CUTANEOUS | Status: DC

## 2021-01-21 MED ORDER — MIDAZOLAM HCL 2 MG/2ML IJ SOLN: 1.0000 mg | Freq: Once | INTRAMUSCULAR | Status: AC

## 2021-01-21 MED ORDER — GUAIFENESIN-DM 100-10 MG/5ML PO SYRP: 15.0000 mL | ORAL_SOLUTION | ORAL | Status: DC | PRN

## 2021-01-21 MED ORDER — MIDAZOLAM HCL 2 MG/2ML IJ SOLN
INTRAMUSCULAR | Status: AC
  Administered 2021-01-21: 1 mg via INTRAVENOUS
  Filled 2021-01-21: qty 2

## 2021-01-21 MED ORDER — CHLORHEXIDINE GLUCONATE 4 % EX LIQD
60.0000 mL | Freq: Once | CUTANEOUS | Status: AC
  Administered 2021-01-21: 4 via TOPICAL
  Filled 2021-01-21: qty 15

## 2021-01-21 MED ORDER — 0.9 % SODIUM CHLORIDE (POUR BTL) OPTIME
TOPICAL | Status: DC | PRN
  Administered 2021-01-21: 1000 mL

## 2021-01-21 MED ORDER — PHENOL 1.4 % MT LIQD: 1.0000 | OROMUCOSAL | Status: DC | PRN

## 2021-01-21 MED ORDER — CEFAZOLIN SODIUM-DEXTROSE 2-4 GM/100ML-% IV SOLN
2.0000 g | Freq: Three times a day (TID) | INTRAVENOUS | Status: AC
  Administered 2021-01-21 – 2021-01-22 (×2): 2 g via INTRAVENOUS
  Filled 2021-01-21 (×3): qty 100

## 2021-01-21 MED ORDER — POLYETHYLENE GLYCOL 3350 17 G PO PACK: 17.0000 g | PACK | Freq: Every day | ORAL | Status: DC | PRN

## 2021-01-21 MED ORDER — ZINC SULFATE 220 (50 ZN) MG PO CAPS
220.0000 mg | ORAL_CAPSULE | Freq: Every day | ORAL | Status: DC
  Administered 2021-01-21 – 2021-01-27 (×7): 220 mg via ORAL
  Filled 2021-01-21 (×7): qty 1

## 2021-01-21 MED ORDER — LABETALOL HCL 5 MG/ML IV SOLN
10.0000 mg | INTRAVENOUS | Status: DC | PRN
Start: 2021-01-21 — End: 2021-01-27

## 2021-01-21 MED ORDER — PENICILLIN G POTASSIUM 20000000 UNITS IJ SOLR
12.0000 10*6.[IU] | Freq: Two times a day (BID) | INTRAVENOUS | Status: DC
  Administered 2021-01-21 – 2021-01-27 (×13): 12 10*6.[IU] via INTRAVENOUS
  Filled 2021-01-21 (×5): qty 12
  Filled 2021-01-21: qty 8
  Filled 2021-01-21 (×8): qty 12

## 2021-01-21 MED ORDER — ORAL CARE MOUTH RINSE: 15.0000 mL | Freq: Once | OROMUCOSAL | Status: AC

## 2021-01-21 MED ORDER — DOCUSATE SODIUM 100 MG PO CAPS: 100.0000 mg | ORAL_CAPSULE | Freq: Every day | ORAL | Status: DC

## 2021-01-21 MED ORDER — MAGNESIUM SULFATE 2 GM/50ML IV SOLN: 2.0000 g | Freq: Every day | INTRAVENOUS | Status: DC | PRN

## 2021-01-21 MED ORDER — ACETAMINOPHEN 325 MG PO TABS: 325.0000 mg | ORAL_TABLET | Freq: Four times a day (QID) | ORAL | Status: DC | PRN

## 2021-01-21 MED ORDER — MAGNESIUM CITRATE PO SOLN
1.0000 | Freq: Once | ORAL | Status: DC | PRN
  Filled 2021-01-21: qty 296

## 2021-01-21 MED ORDER — PROPOFOL 10 MG/ML IV BOLUS
INTRAVENOUS | Status: DC | PRN
  Administered 2021-01-21: 150 mg via INTRAVENOUS

## 2021-01-21 MED ORDER — PANTOPRAZOLE SODIUM 40 MG PO TBEC: 40.0000 mg | DELAYED_RELEASE_TABLET | Freq: Every day | ORAL | Status: DC

## 2021-01-21 MED ORDER — POTASSIUM CHLORIDE CRYS ER 20 MEQ PO TBCR: 20.0000 meq | EXTENDED_RELEASE_TABLET | Freq: Every day | ORAL | Status: DC | PRN

## 2021-01-21 MED ORDER — LACTATED RINGERS IV SOLN: INTRAVENOUS | Status: DC | PRN

## 2021-01-21 MED ORDER — JUVEN PO PACK
1.0000 | PACK | Freq: Two times a day (BID) | ORAL | Status: DC
  Administered 2021-01-21 – 2021-01-27 (×11): 1 via ORAL
  Filled 2021-01-21 (×11): qty 1

## 2021-01-21 MED ORDER — ROPIVACAINE HCL 5 MG/ML IJ SOLN
INTRAMUSCULAR | Status: DC | PRN
  Administered 2021-01-21: 20 mL via PERINEURAL

## 2021-01-21 MED ORDER — GABAPENTIN 100 MG PO CAPS
100.0000 mg | ORAL_CAPSULE | Freq: Three times a day (TID) | ORAL | Status: DC
  Administered 2021-01-21 – 2021-01-27 (×20): 100 mg via ORAL
  Filled 2021-01-21 (×20): qty 1

## 2021-01-21 MED ORDER — HYDROCODONE-ACETAMINOPHEN 7.5-325 MG PO TABS
1.0000 | ORAL_TABLET | ORAL | Status: DC | PRN
  Administered 2021-01-21 – 2021-01-23 (×4): 2 via ORAL
  Administered 2021-01-23: 1 via ORAL
  Administered 2021-01-23 – 2021-01-27 (×3): 2 via ORAL
  Filled 2021-01-21: qty 1
  Filled 2021-01-21 (×7): qty 2

## 2021-01-21 MED ORDER — CHLORHEXIDINE GLUCONATE 0.12 % MT SOLN: 15.0000 mL | Freq: Once | OROMUCOSAL | Status: AC

## 2021-01-21 MED ORDER — LACTATED RINGERS IV SOLN: INTRAVENOUS | Status: DC

## 2021-01-21 MED ORDER — BUSPIRONE HCL 5 MG PO TABS
5.0000 mg | ORAL_TABLET | Freq: Two times a day (BID) | ORAL | Status: DC
  Administered 2021-01-21 – 2021-01-27 (×12): 5 mg via ORAL
  Filled 2021-01-21 (×12): qty 1

## 2021-01-21 MED ORDER — CEFAZOLIN SODIUM-DEXTROSE 2-4 GM/100ML-% IV SOLN
2.0000 g | INTRAVENOUS | Status: AC
  Administered 2021-01-21: 2 g via INTRAVENOUS
  Filled 2021-01-21: qty 100

## 2021-01-21 MED ORDER — SODIUM CHLORIDE 0.9 % IV SOLN: INTRAVENOUS | Status: DC

## 2021-01-21 MED ORDER — ONDANSETRON HCL 4 MG/2ML IJ SOLN
4.0000 mg | Freq: Four times a day (QID) | INTRAMUSCULAR | Status: DC | PRN
Start: 2021-01-21 — End: 2021-01-27
  Administered 2021-01-24: 4 mg via INTRAVENOUS
  Filled 2021-01-21: qty 2

## 2021-01-21 MED ORDER — BISACODYL 5 MG PO TBEC: 5.0000 mg | DELAYED_RELEASE_TABLET | Freq: Every day | ORAL | Status: DC | PRN

## 2021-01-21 MED ORDER — ONDANSETRON HCL 4 MG/2ML IJ SOLN
4.0000 mg | Freq: Four times a day (QID) | INTRAMUSCULAR | Status: DC | PRN
Start: 2021-01-21 — End: 2021-01-21

## 2021-01-21 MED ORDER — MORPHINE SULFATE (PF) 2 MG/ML IV SOLN
0.5000 mg | INTRAVENOUS | Status: DC | PRN
  Administered 2021-01-22: 1 mg via INTRAVENOUS
  Filled 2021-01-21: qty 1

## 2021-01-21 MED ORDER — ALUM & MAG HYDROXIDE-SIMETH 200-200-20 MG/5ML PO SUSP
15.0000 mL | ORAL | Status: DC | PRN
  Administered 2021-01-24 – 2021-01-27 (×5): 30 mL via ORAL
  Filled 2021-01-21 (×5): qty 30

## 2021-01-21 MED ORDER — FENTANYL CITRATE (PF) 100 MCG/2ML IJ SOLN
50.0000 ug | Freq: Once | INTRAMUSCULAR | Status: AC
Start: 2021-01-21 — End: 2021-01-21

## 2021-01-21 MED ORDER — PROPOFOL 10 MG/ML IV BOLUS
INTRAVENOUS | Status: AC
  Filled 2021-01-21: qty 20

## 2021-01-21 MED ORDER — ETOMIDATE 2 MG/ML IV SOLN
INTRAVENOUS | Status: DC | PRN
  Administered 2021-01-21: 10 mg via INTRAVENOUS

## 2021-01-21 MED ORDER — HYDROCODONE-ACETAMINOPHEN 5-325 MG PO TABS
1.0000 | ORAL_TABLET | ORAL | Status: DC | PRN
  Administered 2021-01-22: 2 via ORAL
  Administered 2021-01-24: 1 via ORAL
  Administered 2021-01-25 – 2021-01-26 (×4): 2 via ORAL
  Filled 2021-01-21: qty 1
  Filled 2021-01-21 (×7): qty 2

## 2021-01-21 MED ORDER — SODIUM CHLORIDE 0.9 % IV SOLN: 2.0000 g | Freq: Three times a day (TID) | INTRAVENOUS | Status: DC

## 2021-01-21 MED ORDER — SODIUM CHLORIDE 0.9 % IV SOLN
8.0000 mg/kg | Freq: Every day | INTRAVENOUS | Status: DC
  Filled 2021-01-21: qty 18

## 2021-01-21 MED ORDER — MIDAZOLAM HCL 2 MG/2ML IJ SOLN
INTRAMUSCULAR | Status: AC
  Filled 2021-01-21: qty 2

## 2021-01-21 MED ORDER — METRONIDAZOLE 500 MG PO TABS: 500.0000 mg | ORAL_TABLET | Freq: Two times a day (BID) | ORAL | Status: DC

## 2021-01-21 MED ORDER — FENTANYL CITRATE (PF) 100 MCG/2ML IJ SOLN
INTRAMUSCULAR | Status: AC
  Administered 2021-01-21: 50 ug via INTRAVENOUS
  Filled 2021-01-21: qty 2

## 2021-01-21 MED ORDER — METOPROLOL TARTRATE 5 MG/5ML IV SOLN: 2.0000 mg | INTRAVENOUS | Status: DC | PRN

## 2021-01-21 MED ORDER — CHLORHEXIDINE GLUCONATE 0.12 % MT SOLN
OROMUCOSAL | Status: AC
  Administered 2021-01-21: 15 mL via OROMUCOSAL
  Filled 2021-01-21: qty 15

## 2021-01-21 MED ORDER — ASCORBIC ACID 500 MG PO TABS
1000.0000 mg | ORAL_TABLET | Freq: Every day | ORAL | Status: DC
  Administered 2021-01-21 – 2021-01-27 (×7): 1000 mg via ORAL
  Filled 2021-01-21 (×7): qty 2

## 2021-01-21 MED ORDER — FENTANYL CITRATE (PF) 250 MCG/5ML IJ SOLN
INTRAMUSCULAR | Status: AC
  Filled 2021-01-21: qty 5

## 2021-01-21 MED ORDER — INSULIN ASPART 100 UNIT/ML IJ SOLN
4.0000 [IU] | Freq: Three times a day (TID) | INTRAMUSCULAR | Status: DC
  Administered 2021-01-21 – 2021-01-27 (×17): 4 [IU] via SUBCUTANEOUS

## 2021-01-21 MED ORDER — HYDRALAZINE HCL 20 MG/ML IJ SOLN: 5.0000 mg | INTRAMUSCULAR | Status: DC | PRN

## 2021-01-21 SURGICAL SUPPLY — 38 items
BAG COUNTER SPONGE SURGICOUNT (BAG) ×1 IMPLANT
BAG SPNG CNTER NS LX DISP (BAG) ×1
BLADE SAW RECIP 87.9 MT (BLADE) ×2 IMPLANT
BLADE SURG 21 STRL SS (BLADE) ×2 IMPLANT
BNDG COHESIVE 6X5 TAN STRL LF (GAUZE/BANDAGES/DRESSINGS) IMPLANT
CANISTER WOUNDNEG PRESSURE 500 (CANNISTER) ×1 IMPLANT
COVER SURGICAL LIGHT HANDLE (MISCELLANEOUS) ×2 IMPLANT
CUFF TOURN SGL QUICK 34 (TOURNIQUET CUFF) ×2
CUFF TRNQT CYL 34X4.125X (TOURNIQUET CUFF) ×1 IMPLANT
DRAPE DERMATAC (DRAPES) ×2 IMPLANT
DRAPE INCISE IOBAN 66X45 STRL (DRAPES) ×3 IMPLANT
DRAPE U-SHAPE 47X51 STRL (DRAPES) ×2 IMPLANT
DRESSING PREVENA PLUS CUSTOM (GAUZE/BANDAGES/DRESSINGS) ×1 IMPLANT
DRSG PREVENA PLUS CUSTOM (GAUZE/BANDAGES/DRESSINGS) ×2
DURAPREP 26ML APPLICATOR (WOUND CARE) ×2 IMPLANT
ELECT REM PT RETURN 9FT ADLT (ELECTROSURGICAL) ×2
ELECTRODE REM PT RTRN 9FT ADLT (ELECTROSURGICAL) ×1 IMPLANT
GLOVE SURG ORTHO LTX SZ9 (GLOVE) ×2 IMPLANT
GLOVE SURG UNDER POLY LF SZ9 (GLOVE) ×2 IMPLANT
GOWN STRL REUS W/ TWL XL LVL3 (GOWN DISPOSABLE) ×2 IMPLANT
GOWN STRL REUS W/TWL XL LVL3 (GOWN DISPOSABLE) ×4
KIT BASIN OR (CUSTOM PROCEDURE TRAY) ×2 IMPLANT
KIT TURNOVER KIT B (KITS) ×2 IMPLANT
MANIFOLD NEPTUNE II (INSTRUMENTS) ×2 IMPLANT
NS IRRIG 1000ML POUR BTL (IV SOLUTION) ×2 IMPLANT
PACK ORTHO EXTREMITY (CUSTOM PROCEDURE TRAY) ×2 IMPLANT
PAD ARMBOARD 7.5X6 YLW CONV (MISCELLANEOUS) ×2 IMPLANT
PREVENA RESTOR ARTHOFORM 33X30 (CANNISTER) ×1 IMPLANT
SPONGE T-LAP 18X18 ~~LOC~~+RFID (SPONGE) ×1 IMPLANT
STAPLER VISISTAT 35W (STAPLE) ×1 IMPLANT
STOCKINETTE IMPERVIOUS LG (DRAPES) ×2 IMPLANT
SUT ETHILON 2 0 PSLX (SUTURE) IMPLANT
SUT SILK 2 0 (SUTURE) ×2
SUT SILK 2-0 18XBRD TIE 12 (SUTURE) ×1 IMPLANT
SUT VIC AB 1 CTX 27 (SUTURE) ×4 IMPLANT
TOWEL GREEN STERILE (TOWEL DISPOSABLE) ×2 IMPLANT
TUBE CONNECTING 12X1/4 (SUCTIONS) ×2 IMPLANT
YANKAUER SUCT BULB TIP NO VENT (SUCTIONS) ×2 IMPLANT

## 2021-01-21 NOTE — Anesthesia Procedure Notes (Signed)
Anesthesia Regional Block: Popliteal block   Pre-Anesthetic Checklist: , timeout performed,  Correct Patient, Correct Site, Correct Laterality,  Correct Procedure, Correct Position, site marked,  Risks and benefits discussed,  Surgical consent,  Pre-op evaluation,  At surgeon's request and post-op pain management  Laterality: Left  Prep: chloraprep       Needles:  Injection technique: Single-shot  Needle Type: Echogenic Stimulator Needle     Needle Length: 10cm  Needle Gauge: 20     Additional Needles:   Narrative:  Start time: 01/21/2021 9:40 AM End time: 01/21/2021 9:45 AM Injection made incrementally with aspirations every 5 mL.  Performed by: Personally  Anesthesiologist: Mellody Dance, MD  Additional Notes: A functioning IV was confirmed and monitors were applied.  Sterile prep and drape, hand hygiene and sterile gloves were used.  Negative aspiration and test dose prior to incremental administration of local anesthetic. The patient tolerated the procedure well.Ultrasound  guidance: relevant anatomy identified, needle position confirmed, local anesthetic spread visualized around nerve(s), vascular puncture avoided.  Image printed for medical record.

## 2021-01-21 NOTE — Progress Notes (Signed)
Chart check   S/p left bka today 7/29 Tee no cardiac device involvement/endocarditis Wbc remains high   A/p Group g strep bacteremia Infected/gangrenous left foot s/p bka 7/29  -monitor wbc, if no improvement in another 2-3 days will see if there is occult abscess from seeding of group g strep bacteremia elsewhere (will need to scan abd/pelv) -switch abx to penicillin g iv  -plan duration pcn g 7 days from 7/29 - 8/05 for group g strep bacteremia, however, further w/u as mentioned if wbc without improvement or ongoing sepsis -will continue to follow

## 2021-01-21 NOTE — Progress Notes (Addendum)
Patient ID: Richard [name], male   DOB: 1960/07/14, 60 y.o.   MRN: [id number]     Advanced Heart Failure Rounding Note  PCP-Cardiologist: Dr. 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  He is sitting up in bed in no acute distress with no complaints.    Torsemide held yesterday due to worsening renal function.  Voiding well today (-4.2L x24 hours with ~2.5L of urine output) with CVP of 5. Renal function improved with a creatinine of 1.45, which is near his baseline.    Objective:   Weight Range: 115.3 kg Body mass index is 30.14 kg/m.   Vital Signs:   Temp:  [97 F (36.1 C)-98.4 F (36.9 C)] 97 F (36.1 C) (07/29 1110) Pulse Rate:  [60-97] 81 (07/29 1110) Resp:  [13-21] 15 (07/29 1110) BP: (109-146)/(78-110) 112/91 (07/29 1110) SpO2:  [93 %-100 %] 97 % (07/29 1110) FiO2 (%):  [40 %] 40 % (07/28 2333) Weight:  [115.3 kg] 115.3 kg (07/29 0428) Last BM Date: 01/20/21  Weight change: Filed Weights   01/19/21 0416 01/20/21 0400 01/21/21 0428  Weight: 117.1 kg 115.8 kg 115.3 kg    Intake/Output:   Intake/Output Summary (Last 24 hours) at 01/21/2021 1318 Last data filed at 01/21/2021 1115 Gross per 24 hour  Intake 700.93 ml  Output 5200 ml  Net -4499.07 ml       Physical Exam   General: NAD Neck: No JVD, no thyromegaly or thyroid nodule.  Lungs: Clear to auscultation bilaterally with normal respiratory effort. CV: Lateral PMI.  Heart regular S1/S2, no S3/S4, no murmur.  No peripheral edema.  Abdomen: Soft, nontender, no hepatosplenomegaly, no distention.  Skin: L BKA with shrinker, wound vac and limb protector in place.  Right leg with scaling and dime-sized open wound on anterior calf Neurologic: Alert and oriented x 3.  Psych: Normal affect. Extremities: No clubbing or cyanosis. L BKA with shrinker, wound vac and limb protector in place  HEENT: Normal.    Telemetry   NSR 70s (personally reviewed)   Labs    CBC Recent Labs     01/20/21 0357 01/21/21 0332  WBC 34.1* 30.7*  NEUTROABS 29.9* 26.4*  HGB 16.1 14.9  HCT 49.1 45.4  MCV 87.7 87.1  PLT 255 313    Basic Metabolic Panel Recent Labs    87/86/76 0332 01/21/21 0446  NA 127* 131*  K 7.5* 4.7  CL 87* 92*  CO2 30 32  GLUCOSE 260* 160*  BUN 57* 57*  CREATININE 1.57* 1.45*  CALCIUM 8.7* 8.9    Liver Function Tests Recent Labs    01/20/21 0357 01/21/21 0332  AST 21 24  ALT 22 24  ALKPHOS 65 83  BILITOT 1.4* 0.7  PROT 7.9 7.9  ALBUMIN 2.1* 2.0*    No results for input(s): LIPASE, AMYLASE in the last 72 hours. Cardiac Enzymes No results for input(s): CKTOTAL, CKMB, CKMBINDEX, TROPONINI in the last 72 hours.   BNP: BNP (last 3 results) Recent Labs    10/16/20 1810 11/09/20 1519 01/15/21 1545  BNP 329.7* 373.4* 532.8*     ProBNP (last 3 results) No results for input(s): PROBNP in the last 8760 hours.   D-Dimer No results for input(s): DDIMER in the last 72 hours. Hemoglobin A1C No results for input(s): HGBA1C in the last 72 hours.  Fasting Lipid Panel No results for input(s): CHOL, HDL, LDLCALC, TRIG, CHOLHDL, LDLDIRECT in the last 72 hours. Thyroid Function Tests No  results for input(s): TSH, T4TOTAL, T3FREE, THYROIDAB in the last 72 hours.  Invalid input(s): FREET3   Other results:   Imaging    CT ANKLE LEFT WO CONTRAST  Result Date: 01/21/2021 CLINICAL DATA:  Non-pressure chronic ulcer, lower limb evaluate for osteomyelitis, dm2 and chronic ulcer left heel EXAM: CT OF THE LEFT ANKLE WITHOUT CONTRAST TECHNIQUE: Multidetector CT imaging of the left ankle was performed according to the standard protocol. Multiplanar CT image reconstructions were also generated. COMPARISON:  None. FINDINGS: Bones/Joint/Cartilage There is a nondisplaced anterior calcaneal process fracture along the lateral aspect. There is a plantar soft tissue wound in gas along the heel. There is minimal cortical irregularity, sclerosis, and  periosteal reaction at the adjacent plantar aspect of the calcaneus. Tiny cortical defect on coronal image 84. This is process is located near the plantar fascia insertion. There is mild tibiotalar, mild posterior subtalar, and moderate middle subtalar degenerative change. There is mild talonavicular and calcaneocuboid degenerative change. Ligaments Suboptimally assessed by CT. Muscles and Tendons No acute tendon injury on noncontrast CT. Soft tissues There is ankle and foot soft tissue swelling. IMPRESSION: Plantar soft tissue wound with adjacent swelling and soft tissue gas concerning for necrotizing infection, with possible early changes of osteomyelitis involving the plantar aspect of the calcaneus near the plantar fascia insertion. No visible abscess within the field of view. Further evaluation with MRI could be considered to determine extent of involvement. Nondisplaced anterior calcaneal process fracture along the lateral aspect. Diffuse ankle and foot soft tissue swelling. Electronically Signed   By: Caprice Renshaw   On: 01/21/2021 07:16     Medications:     Scheduled Medications:  allopurinol  100 mg Oral Daily   vitamin C  1,000 mg Oral Daily   aspirin EC  81 mg Oral Daily   atorvastatin  40 mg Oral Daily   Chlorhexidine Gluconate Cloth  6 each Topical Daily   docusate sodium  100 mg Oral BID   empagliflozin  10 mg Oral Daily   feeding supplement (GLUCERNA SHAKE)  237 mL Oral BID BM   gabapentin  100 mg Oral TID   heparin  5,000 Units Subcutaneous Q8H   hydrALAZINE  10 mg Oral Q8H   insulin aspart  0-15 Units Subcutaneous TID WC   insulin aspart  0-5 Units Subcutaneous QHS   insulin aspart  4 Units Subcutaneous TID WC   insulin glargine  10 Units Subcutaneous BID   ipratropium-albuterol  3 mL Nebulization BID   isosorbide mononitrate  30 mg Oral Daily   multivitamin with minerals  1 tablet Oral Daily   mupirocin ointment  1 application Nasal BID   nutrition supplement (JUVEN)  1  packet Oral BID BM   pantoprazole  40 mg Oral BID   povidone-iodine  2 application Topical Once   Ensure Max Protein  11 oz Oral QHS   senna  1 tablet Oral BID   sodium chloride flush  10-40 mL Intracatheter Q12H   spironolactone  12.5 mg Oral Daily   zinc sulfate  220 mg Oral Daily    Infusions:  sodium chloride 75 mL/hr at 01/21/21 1207    ceFAZolin (ANCEF) IV     lactated ringers 10 mL/hr at 01/21/21 0946   magnesium sulfate bolus IVPB     penicillin g continuous IV infusion      PRN Medications: [START ON 01/22/2021] acetaminophen, albuterol, alum & mag hydroxide-simeth, baclofen, bisacodyl, guaiFENesin-dextromethorphan, hydrALAZINE, HYDROcodone-acetaminophen, HYDROcodone-acetaminophen, labetalol, magnesium sulfate bolus IVPB,  metoprolol tartrate, morphine injection, ondansetron, phenol, polyethylene glycol, potassium chloride, sodium chloride flush   Assessment/Plan   1. Acute on chronic systolic CHF:  Primarily nonischemic cardiomyopathy.  Echo in 2017 with EF 15-20%.  [employer] ICD 2018. LHC/RHC 11/06/17 showed volume overload with 80% RCA stenosis.  Cardiac index low at 1.91. The degree of coronary disease does not explain his cardiomyopathy.  PYP scan in 7/21 not suggestive of transthyretin amyloidosis. Admitted 07/23 with acute on chronic CHF in setting of acute on chronic respiratory faliure with hypoxia/possible COPD exacerbation and group G strep bacteremia.  Echo this admission with with EF 20-25%, anterior, anteroseptal, anterolateral and apical akinesis. Today, co-ox stable at 76.4%.  Creatinine improved today to 1.45 from 1.57.  - TEE on 7/28: EF 15 to 20 percent, severely dilated LV with normal wall thickness and diffuse hypokinesis; moderately dilated Rv with moderate systolic dysfunction. No evidence of endocarditis  - Continue to hold torsemide -- CVP low, good UOP continues and renal function improving  - Continue hydralazine 10 mg TID/Imdur 30 mg daily. No BP room to  increase.  - Continue Jardiance - Continue spiro 12.5 mg daily. Monitor K. - Not adherent with medications PTA. Followed by paramedicine in the community. Has not kept most recent clinic f/u. Did not return calls to reschedule home visits this past month.  2. Acute on chronic hypoxic respiratory failure: Initially requiring BiPAP, now on room air. Likely d/t combination of COPD exacerbation and acute on chronic CHF. CCM saw and felt CHF more likely contributing to respiratory failure - Has home 02 but admits he does not use it regularly 3. COPD with possible acute exacerbation: Never smoked, but apparently had significant occupational exposure.  It appears that he won a lawsuit dealing with the occupational exposure-related COPD. CPX in 2/21 showed severe functional limitation, but it appeared to be due to pulmonary abnormalities rather than HF.  He is on home oxygen.  -Treated for COPD exacerbation per triad 4. Group G strep bacteremia: Leg wounds suspected source. On PCN G through 8/5.  No endocarditis on TEE. 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 CAD:  LHC in 5/19 with 80-90% proximal RCA stenosis treated with DES to RCA.  This did not cause his cardiomyopathy, has a large RCA.  No chest pain.  -Continue aspirin and statin 6. Polycythemia: Likely related to chronic hypoxia. 7. OSA: Cannot tolerate CPAP.  Has oxygen to wear at night, but not adherent in community 8. DM II, uncontrolled: A1c 10.3 - He is on insulin.   - Restarted Jardiance 9. Hypertriglyceridemia: Off Vascepa due to blood in stool that resolved. - Continue fenofibrate 145 mg daily.  10. AKI on CKD: Has stage IIIa CKD at baseline. Baseline creatinine 1.5 - Now at baseline  11. 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 Enrolled  in Paramedicine in the community. -Reports access to medications and transportation. Not taking medicines as prescribed PTA. -TOC consult.  Length of Stay: 6  [name] Garter, NP  01/21/2021, 1:18 PM  Advanced Heart Failure Team Pager [phone] (M-F; 7a - 5p)  Please contact [facility] for night-coverage after hours (5p -7a ) and weekends on [email or web address]   Patient seen with NP, agree with the  above note.   Left BKA today, stable post-op.  CVP 5.  Torsemide on hold, creatinine down to 1.45.    General: NAD Neck: No JVD, no thyromegaly or thyroid nodule.  Lungs: Clear to auscultation bilaterally with normal respiratory effort. CV: Nondisplaced PMI.  Heart regular S1/S2, no S3/S4, no murmur.  No peripheral edema.   Abdomen: Soft, nontender, no hepatosplenomegaly, no distention.  Skin: Intact without lesions or rashes.  Neurologic: Alert and oriented x 3.  Psych: Normal affect. Extremities: s/p left BKA HEENT: Normal.   Can turn off IV fluid.  Otherwise, no changes from my standpoint.  Can resume torsemide at 40 mg po bid tomorrow if creatinine remains stable.   Marca Ancona 01/21/2021 3:10 PM

## 2021-01-21 NOTE — Progress Notes (Signed)
Orthopedic Tech Progress Note Patient Details:  Richard [name] 1960/12/25 [id number]  Patient ID: Richard [name], male   DOB: 10-03-1960, 60 y.o.   MRN: [id number]  [name] 01/21/2021, 10:50 AMCalled [facility] for left shrinker.

## 2021-01-21 NOTE — Progress Notes (Signed)
Noted pt for BKA today. HF ed has been done. Will sign off.  Ethelda Chick CES, ACSM 8:33 AM 01/21/2021

## 2021-01-21 NOTE — Anesthesia Preprocedure Evaluation (Addendum)
Anesthesia Evaluation  Patient identified by MRN, date of birth, ID band Patient awake    Reviewed: Allergy & Precautions, H&P , NPO status , [name] Chart, lab work & pertinent test results  Airway Mallampati: II  TM Distance: >3 FB Neck ROM: full    Dental no notable dental hx.    Pulmonary COPD,  oxygen dependent, former smoker,    breath sounds clear to auscultation       Cardiovascular hypertension, + CAD, + Past MI and +CHF  + Cardiac Defibrillator  Rhythm:regular Rate:Normal  01/16/21  1. The anterior,anteroseptal,anterolateral walls are akinetic. The apex  is akinetic. Marland Kitchen Left ventricular ejection fraction, by estimation, is 20 to  25%. The left ventricle has severely decreased function. The left  ventricle demonstrates regional wall  motion abnormalities (see scoring diagram/findings for description). The  left ventricular internal cavity size was moderately to severely dilated.  Left ventricular diastolic parameters are indeterminate.  2. RV poorly visualized. Grossly appears mildly enlarged with low normal  to mildly decreased function. Right ventricular systolic function was not  well visualized. The right ventricular size is not well visualized.  Tricuspid regurgitation signal is  inadequate for assessing PA pressure.  3. Left atrial size was mildly dilated.  4. The mitral valve is normal in structure. No evidence of mitral valve  regurgitation. No evidence of mitral stenosis.  5. The aortic valve was not well visualized. Aortic valve regurgitation  is not visualized. No aortic stenosis is present.    Neuro/Psych Anxiety    GI/Hepatic GERD  ,  Endo/Other  diabetes, Type 2  Renal/GU Renal InsufficiencyRenal disease  negative genitourinary   Musculoskeletal negative musculoskeletal ROS (+)   Abdominal Normal abdominal exam  (+)   Peds negative pediatric ROS (+)  Hematology negative hematology  ROS (+)   Anesthesia Other Findings   Reproductive/Obstetrics negative OB ROS                            Anesthesia Physical  Anesthesia Plan  ASA: 3  Anesthesia Plan: General   Post-op Pain Management:    Induction: Intravenous  PONV Risk Score and Plan: 1 and Treatment may vary due to age or medical condition  Airway Management Planned: LMA  Additional Equipment: None  Intra-op Plan:   Post-operative Plan: Extubation in OR  Informed Consent: I have reviewed the patients History and Physical, chart, labs and discussed the procedure including the risks, benefits and alternatives for the proposed anesthesia with the patient or authorized representative who has indicated his/her understanding and acceptance.     Dental advisory given  Plan Discussed with: CRNA, Anesthesiologist and Surgeon  Anesthesia Plan Comments:       Anesthesia Quick Evaluation

## 2021-01-21 NOTE — Anesthesia Procedure Notes (Signed)
Procedure Name: LMA Insertion Date/Time: 01/21/2021 10:21 AM Performed by: Carlos American, CRNA Pre-anesthesia Checklist: Patient identified, Emergency Drugs available, Suction available and Patient being monitored Patient Re-evaluated:Patient Re-evaluated prior to induction Oxygen Delivery Method: Circle System Utilized Preoxygenation: Pre-oxygenation with 100% oxygen Induction Type: IV induction Ventilation: Mask ventilation without difficulty LMA: LMA inserted LMA Size: 4.0 Number of attempts: 1 Placement Confirmation: positive ETCO2 Tube secured with: Tape Dental Injury: Teeth and Oropharynx as per pre-operative assessment

## 2021-01-21 NOTE — Progress Notes (Signed)
PROGRESS NOTE    Richard [name]  WGN:[id number] DOB: 04/13/1961 DOA: 01/15/2021 PCP: Ananias Pilgrim, MD   Brief Narrative: 60 year old with past medical history significant for diabetes type 2, COPD on chronic oxygen 2 L, systolic heart failure status post AICD, venous stasis ulcer, hypertension, CKD, pulmonary nodule [name] patient with increased shortness of breath, no fever no chest pain.  Noted to have oxygen saturation 87 on room air with EMS.  4 L [place] applied by EMS and gave 1 nebulizer, Solu-Medrol.  CCM was consulted for worsening hypoxia and patient being placed on BiPAP.  Patient was admitted for further management.    Assessment & Plan:   Principal Problem:   Acute on chronic respiratory failure with hypoxia and hypercapnia (HCC) Active Problems:   Hypertension   CKD (chronic kidney disease)   Presence of implantable cardioverter-defibrillator (ICD)   GERD (gastroesophageal reflux disease)   Diabetes mellitus with diabetic neuropathy, with long-term current use of insulin (HCC)   COPD exacerbation (HCC)   Chronic systolic CHF (congestive heart failure) (HCC)   Acute renal failure superimposed on stage 3 chronic kidney disease (HCC)   Elevated troponin   Hyponatremia   Bacteremia   Chronic ulcer of left ankle (HCC)   Severe sepsis without septic shock (HCC)   Open wound   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HCC)   SOB (shortness of breath)  1-Acute on Chronic Hypoxic Respiratory Failure, acute on chronic combined heart failure, AICD -Patient required BiPAP on admission.  Currently on nasal cannula oxygen for mentation -Elevation of BNP and flat troponin.  Chest x-ray with mild vascular congestion. -Echo showed ejection fraction 20 to 25% -Cardiology  consulted appreciate recommendation. -Continue with hydralazine, Imdur, torsemide and  spironolactone.  -Bisoprolol, digoxin and Entresto on hold -cardiology holding torsemide.   Severe sepsis likely secondary to  group G bacteremia Likely secondary to chronic left heel unstageable ulcer.  -Patient tachycardic, tachypneic L a more than 2 leukocytosis -Cultures x2 gross strep group G, Staphylococcus  -Repeated blood culture 7/24: No growth to date -ID Following and recommend TEE. TEE negative -ABI requested result, but no results was received -Dr Lajoyce Corners consulted. Recommended BKA.  -CT;  Plantar soft tissue wound with adjacent swelling and soft tissue gas concerning for necrotizing infection, with possible early changes of osteomyelitis involving the plantar aspect of the calcaneus near the plantar fascia insertion. No visible abscess within the field of view. Further evaluation with MRI could be considered to determine extent of involvement. Nondisplaced anterior calcaneal process fracture along the lateral aspect. -Continue with IV penicillin G. Needs at least 7 days until 01/28/2021 for bacteremia.  -If Leukocytosis  fail to improved, might need to look for other sources.   Possible COPD exacerbation Azithromycin, On prednisone.  continue with  nebulizer treatments  Diabetes type 2: Continue with Lantus and sliding scale insulin A1c 10  AKI on CKD stage III: Baseline creatinine 1.5  CAD/HLD Status post PCI With aspirin and statin  Chronic venous stasis ulcer: Status postdebridement of left foot wound on 01/13/2021 at OSH Wound care following.  Hiccup:  Started baclofen, on PPI to twice daily Will try Gabapentin.   Nutrition Problem: Increased nutrient needs Etiology: wound healing    Signs/Symptoms: estimated needs    Interventions: Glucerna shake, MVI, Premier Protein  Estimated body mass index is 30.14 kg/m as calculated from the following:   Height as of this encounter: 6\' 5"  (1.956 m).   Weight as of this encounter: 115.3  kg.   DVT prophylaxis: Heparin Code Status: Full code Family Communication: Care discussed with patient, wife at bedside.  Disposition Plan:  Status  is: Inpatient  Remains inpatient appropriate because:IV treatments appropriate due to intensity of illness or inability to take PO  Dispo: The patient is from: Home              Anticipated d/c is to: Home              Patient currently is not medically stable to d/c.   Difficult to place patient No        Consultants:  Cardiology ID  Procedures:    Antimicrobials:    Subjective: I saw him Post Surgery.  He denies pain.  He wants to eat. Not having hiccups currently.   Objective: Vitals:   01/21/21 0945 01/21/21 0950 01/21/21 1050 01/21/21 1110  BP: (!) 146/89 136/86 (!) 141/94 (!) 112/91  Pulse: 77 84 88 81  Resp: 18 13 20 15   Temp:   (!) 97 F (36.1 C) (!) 97 F (36.1 C)  TempSrc:      SpO2: 99% 97% 100% 97%  Weight:      Height:        Intake/Output Summary (Last 24 hours) at 01/21/2021 1358 Last data filed at 01/21/2021 1115 Gross per 24 hour  Intake 700.93 ml  Output 5200 ml  Net -4499.07 ml    Filed Weights   01/19/21 0416 01/20/21 0400 01/21/21 0428  Weight: 117.1 kg 115.8 kg 115.3 kg    Examination:  General exam: NAD Respiratory system: CTA Cardiovascular system: S 1, S 2 RR Gastrointestinal system: BS present, soft, nt Central nervous system: Alert Extremities: S/P BKA  Data Reviewed: I have personally reviewed following labs and imaging studies  CBC: Recent Labs  Lab 01/17/21 0538 01/18/21 0325 01/19/21 0500 01/20/21 0357 01/21/21 0332  WBC 26.8* 23.1* 29.3* 34.1* 30.7*  NEUTROABS 23.9* 19.6* 25.6* 29.9* 26.4*  HGB 17.0 17.1* 16.3 16.1 14.9  HCT 53.5* 51.8 50.8 49.1 45.4  MCV 88.6 88.7 88.0 87.7 87.1  PLT 346 348 323 255 313    Basic Metabolic Panel: Recent Labs  Lab 01/15/21 2307 01/16/21 0323 01/17/21 0538 01/19/21 0500 01/19/21 0634 01/20/21 0357 01/21/21 0332 01/21/21 0446  NA  --  135   < > 129* 133* 131* 127* 131*  K  --  5.2*   < > >7.5* 4.6 4.5 7.5* 4.7  CL  --  93*   < > 90* 95* 89* 87* 92*  CO2  --   27   < > 30 30 30 30  32  GLUCOSE  --  454*   < > 253* 144* 181* 260* 160*  BUN  --  77*   < > 65* 64* 63* 57* 57*  CREATININE  --  2.37*   < > 1.57* 1.46* 1.75* 1.57* 1.45*  CALCIUM  --  8.9   < > 8.5* 8.7* 8.7* 8.7* 8.9  MG 2.5* 2.7*  --   --   --   --   --   --   PHOS 6.2* 6.9*  --   --   --   --   --   --    < > = values in this interval not displayed.    GFR: Estimated Creatinine Clearance: 78.2 mL/min (A) (by C-G formula based on SCr of 1.45 mg/dL (H)). Liver Function Tests: Recent Labs  Lab 01/17/21 0538 01/18/21 0325 01/19/21 0500 01/20/21  2376 01/21/21 0332  AST 20 18 26 21 24   ALT 22 19 25 22 24   ALKPHOS 71 63 70 65 83  BILITOT 0.7 0.9 1.4* 1.4* 0.7  PROT 7.4 7.9 8.1 7.9 7.9  ALBUMIN 2.0* 2.2* 2.2* 2.1* 2.0*    No results for input(s): LIPASE, AMYLASE in the last 168 hours. No results for input(s): AMMONIA in the last 168 hours. Coagulation Profile: No results for input(s): INR, PROTIME in the last 168 hours. Cardiac Enzymes: Recent Labs  Lab 01/15/21 2307  CKTOTAL 101    BNP (last 3 results) No results for input(s): PROBNP in the last 8760 hours. HbA1C: No results for input(s): HGBA1C in the last 72 hours.  CBG: Recent Labs  Lab 01/20/21 2025 01/21/21 0448 01/21/21 0912 01/21/21 1054 01/21/21 1135  GLUCAP 317* 159* 92 92 70    Lipid Profile: No results for input(s): CHOL, HDL, LDLCALC, TRIG, CHOLHDL, LDLDIRECT in the last 72 hours. Thyroid Function Tests: No results for input(s): TSH, T4TOTAL, FREET4, T3FREE, THYROIDAB in the last 72 hours. Anemia Panel: No results for input(s): VITAMINB12, FOLATE, FERRITIN, TIBC, IRON, RETICCTPCT in the last 72 hours. Sepsis Labs: Recent Labs  Lab 01/15/21 1805 01/15/21 2307 01/16/21 0323 01/17/21 0538 01/18/21 0325  PROCALCITON  --  12.40  --  6.72 4.37  LATICACIDVEN 2.1* 1.8 2.1* 1.8  --      Recent Results (from the past 240 hour(s))  Culture, blood (routine x 2)     Status: None   Collection  Time: 01/15/21  3:41 PM   Specimen: BLOOD RIGHT HAND  Result Value Ref Range Status   Specimen Description BLOOD RIGHT HAND  Final   Special Requests   Final    BOTTLES DRAWN AEROBIC AND ANAEROBIC Blood Culture adequate volume   Culture   Final    NO GROWTH 5 DAYS Performed at [facility], [address], [place], [address] [place]    Report Status 01/20/2021 FINAL  Final  Resp Panel by RT-PCR (Flu A&B, Covid) Nasopharyngeal Swab     Status: None   Collection Time: 01/15/21  3:45 PM   Specimen: Nasopharyngeal Swab; Nasopharyngeal(NP) swabs in vial transport medium  Result Value Ref Range Status   SARS Coronavirus 2 by RT PCR NEGATIVE NEGATIVE Final    Comment: (NOTE) SARS-CoV-2 target nucleic acids are NOT DETECTED.  The SARS-CoV-2 RNA is generally detectable in upper respiratory specimens during the acute phase of infection. The lowest concentration of SARS-CoV-2 viral copies this assay can detect is 138 copies/mL. A negative result does not preclude SARS-Cov-2 infection and should not be used as the sole basis for treatment or other patient management decisions. A negative result may occur with  improper specimen collection/handling, submission of specimen other than nasopharyngeal swab, presence of viral mutation(s) within the areas targeted by this assay, and inadequate number of viral copies(<138 copies/mL). A negative result must be combined with clinical observations, patient history, and epidemiological information. The expected result is Negative.  Fact Sheet for Patients:  01/22/2021  Fact Sheet for Healthcare Providers:  01/17/21  This test is no t yet approved or cleared by the [employer] FDA and  has been authorized for detection and/or diagnosis of SARS-CoV-2 by FDA under an Emergency Use Authorization (EUA). This EUA will remain  in effect (meaning this test can be used) for the duration  of the COVID-19 declaration under Section 564(b)(1) of the Act, 21 U.S.C.section 360bbb-3(b)(1), unless the authorization is terminated  or  revoked sooner.       Influenza A by PCR NEGATIVE NEGATIVE Final   Influenza B by PCR NEGATIVE NEGATIVE Final    Comment: (NOTE) The Xpert Xpress SARS-CoV-2/FLU/RSV plus assay is intended as an aid in the diagnosis of influenza from Nasopharyngeal swab specimens and should not be used as a sole basis for treatment. Nasal washings and aspirates are unacceptable for Xpert Xpress SARS-CoV-2/FLU/RSV testing.  Fact Sheet for Patients: [employer]  Fact Sheet for Healthcare Providers: [employer]ious[employer]ker[employer]it  This test is not yet approved or cleared by the [place] FDA and has been authorized for detection and/or diagnosis of SARS-CoV-2 by FDA under an Emergency Use Authorization (EUA). This EUA will remain in effect (meaning this test can be used) for the duration of the COVID-19 declaration under Section 564(b)(1) of the Act, 21 U.S.C. section 360bbb-3(b)(1), unless the authorization is terminated or revoked.  Performed at [facility], [address] [place], [place] [address]   Culture, blood (routine x 2)     Status: Abnormal   Collection Time: 01/15/21  6:05 PM   Specimen: BLOOD LEFT FOREARM  Result Value Ref Range Status   Specimen Description BLOOD LEFT FOREARM  Final   Special Requests   Final    BOTTLES DRAWN AEROBIC AND ANAEROBIC Blood Culture adequate volume   Culture  Setup Time   Final    GRAM POSITIVE COCCI IN CHAINS AEROBIC BOTTLE ONLY CRITICAL RESULT CALLED TO, READ BACK BY AND VERIFIED WITH: PHARM D K.HAMMONS ON [id number] AT 0935 BY E.PARRISH    Culture (A)  Final    STREPTOCOCCUS GROUP G STAPHYLOCOCCUS HOMINIS CRITICAL RESULT CALLED TO, READ BACK BY AND VERIFIED WITH: PHARMD JEREMY F. [id number] FCP THE SIGNIFICANCE OF ISOLATING THIS ORGANISM FROM A SINGLE  VENIPUNCTURE CANNOT BE PREDICTED WITHOUT FURTHER CLINICAL AND CULTURE CORRELATION. SUSCEPTIBILITIES AVAILABLE ONLY ON REQUEST. Performed at [facility], [address] [place], [place] [address]    Report Status 01/19/2021 FINAL  Final   Organism ID, Bacteria STREPTOCOCCUS GROUP G  Final      Susceptibility   Streptococcus group g - MIC*    CLINDAMYCIN RESISTANT Resistant     AMPICILLIN <=0.25 SENSITIVE Sensitive     ERYTHROMYCIN 4 RESISTANT Resistant     VANCOMYCIN 0.5 SENSITIVE Sensitive     CEFTRIAXONE <=0.12 SENSITIVE Sensitive     LEVOFLOXACIN 1 SENSITIVE Sensitive     PENICILLIN Value in next row Sensitive      SENSITIVEMIC <=0.06    * STREPTOCOCCUS GROUP G  Blood Culture ID Panel (Reflexed)     Status: Abnormal   Collection Time: 01/15/21  6:05 PM  Result Value Ref Range Status   Enterococcus faecalis NOT DETECTED NOT DETECTED Final   Enterococcus Faecium NOT DETECTED NOT DETECTED Final   Listeria monocytogenes NOT DETECTED NOT DETECTED Final   Staphylococcus species DETECTED (A) NOT DETECTED Final    Comment: CRITICAL RESULT CALLED TO, READ BACK BY AND VERIFIED WITH: PHARM D K.HAMMONS ON [id number] AT 0935 BY E.PARRISH    Staphylococcus aureus (BCID) NOT DETECTED NOT DETECTED Final   Staphylococcus epidermidis NOT DETECTED NOT DETECTED Final   Staphylococcus lugdunensis NOT DETECTED NOT DETECTED Final   Streptococcus species DETECTED (A) NOT DETECTED Final    Comment: Not Enterococcus species, Streptococcus agalactiae, Streptococcus pyogenes, or Streptococcus pneumoniae. CRITICAL RESULT CALLED TO, READ BACK BY AND VERIFIED WITH: PHARM D K.HAMMONS ON [id number] AT 0935 BY E.PARRISH    Streptococcus agalactiae NOT DETECTED  NOT DETECTED Final   Streptococcus pneumoniae NOT DETECTED NOT DETECTED Final   Streptococcus pyogenes NOT DETECTED NOT DETECTED Final   A.calcoaceticus-baumannii NOT DETECTED NOT DETECTED Final   Bacteroides fragilis NOT DETECTED NOT DETECTED Final    Enterobacterales NOT DETECTED NOT DETECTED Final   Enterobacter cloacae complex NOT DETECTED NOT DETECTED Final   Escherichia coli NOT DETECTED NOT DETECTED Final   Klebsiella aerogenes NOT DETECTED NOT DETECTED Final   Klebsiella oxytoca NOT DETECTED NOT DETECTED Final   Klebsiella pneumoniae NOT DETECTED NOT DETECTED Final   Proteus species NOT DETECTED NOT DETECTED Final   Salmonella species NOT DETECTED NOT DETECTED Final   Serratia marcescens NOT DETECTED NOT DETECTED Final   Haemophilus influenzae NOT DETECTED NOT DETECTED Final   Neisseria meningitidis NOT DETECTED NOT DETECTED Final   Pseudomonas aeruginosa NOT DETECTED NOT DETECTED Final   Stenotrophomonas maltophilia NOT DETECTED NOT DETECTED Final   Candida albicans NOT DETECTED NOT DETECTED Final   Candida auris NOT DETECTED NOT DETECTED Final   Candida glabrata NOT DETECTED NOT DETECTED Final   Candida krusei NOT DETECTED NOT DETECTED Final   Candida parapsilosis NOT DETECTED NOT DETECTED Final   Candida tropicalis NOT DETECTED NOT DETECTED Final   Cryptococcus neoformans/gattii NOT DETECTED NOT DETECTED Final    Comment: Performed at [facility], [address] [place], [place] [address]  MRSA Next Gen by PCR, Nasal     Status: Abnormal   Collection Time: 01/15/21 10:38 PM   Specimen: Nasal Mucosa; Nasal Swab  Result Value Ref Range Status   MRSA by PCR Next Gen DETECTED (A) NOT DETECTED Final    Comment: RESULT CALLED TO, READ BACK BY AND VERIFIED WITH: RN M.SHULL ON [id number] AT 1121 BY E.PARRISH (NOTE) The GeneXpert MRSA Assay (FDA approved for NASAL specimens only), is one component of a comprehensive MRSA colonization surveillance program. It is not intended to diagnose MRSA infection nor to guide or monitor treatment for MRSA infections. Test performance is not FDA approved in patients less than 75 years old. Performed at [facility], [address], [place], [place] [address]   Culture, blood  (routine x 2)     Status: None (Preliminary result)   Collection Time: 01/16/21 11:38 AM   Specimen: BLOOD  Result Value Ref Range Status   Specimen Description BLOOD RIGHT ANTECUBITAL  Final   Special Requests   Final    BOTTLES DRAWN AEROBIC ONLY Blood Culture adequate volume   Culture   Final    NO GROWTH 4 DAYS Performed at [facility], [address] [place], [place] [address]    Report Status PENDING  Incomplete  Culture, blood (routine x 2)     Status: None (Preliminary result)   Collection Time: 01/16/21 11:39 AM   Specimen: BLOOD RIGHT HAND  Result Value Ref Range Status   Specimen Description BLOOD RIGHT HAND  Final   Special Requests   Final    BOTTLES DRAWN AEROBIC ONLY Blood Culture results may not be optimal due to an inadequate volume of blood received in culture bottles   Culture   Final    NO GROWTH 4 DAYS Performed at [facility], [address], [place], [place] [address]    Report Status PENDING  Incomplete  Surgical pcr screen     Status: Abnormal   Collection Time: 01/21/21 12:34 AM   Specimen: Nasal Mucosa; Nasal Swab  Result Value Ref Range Status  MRSA, PCR NEGATIVE NEGATIVE Final   Staphylococcus aureus POSITIVE (A) NEGATIVE Final    Comment: (NOTE) The Xpert SA Assay (FDA approved for NASAL specimens in patients 88 years of age and older), is one component of a comprehensive surveillance program. It is not intended to diagnose infection nor to guide or monitor treatment. Performed at [facility], [address], [place], [place] [address]           Radiology Studies: CT ANKLE LEFT WO CONTRAST  Result Date: 01/21/2021 CLINICAL DATA:  Non-pressure chronic ulcer, lower limb evaluate for osteomyelitis, dm2 and chronic ulcer left heel EXAM: CT OF THE LEFT ANKLE WITHOUT CONTRAST TECHNIQUE: Multidetector CT imaging of the left ankle was performed according to the standard protocol. Multiplanar CT image reconstructions were  also generated. COMPARISON:  None. FINDINGS: Bones/Joint/Cartilage There is a nondisplaced anterior calcaneal process fracture along the lateral aspect. There is a plantar soft tissue wound in gas along the heel. There is minimal cortical irregularity, sclerosis, and periosteal reaction at the adjacent plantar aspect of the calcaneus. Tiny cortical defect on coronal image 84. This is process is located near the plantar fascia insertion. There is mild tibiotalar, mild posterior subtalar, and moderate middle subtalar degenerative change. There is mild talonavicular and calcaneocuboid degenerative change. Ligaments Suboptimally assessed by CT. Muscles and Tendons No acute tendon injury on noncontrast CT. Soft tissues There is ankle and foot soft tissue swelling. IMPRESSION: Plantar soft tissue wound with adjacent swelling and soft tissue gas concerning for necrotizing infection, with possible early changes of osteomyelitis involving the plantar aspect of the calcaneus near the plantar fascia insertion. No visible abscess within the field of view. Further evaluation with MRI could be considered to determine extent of involvement. Nondisplaced anterior calcaneal process fracture along the lateral aspect. Diffuse ankle and foot soft tissue swelling. Electronically Signed   By: Caprice Renshaw   On: 01/21/2021 07:16        Scheduled Meds:  allopurinol  100 mg Oral Daily   vitamin C  1,000 mg Oral Daily   aspirin EC  81 mg Oral Daily   atorvastatin  40 mg Oral Daily   Chlorhexidine Gluconate Cloth  6 each Topical Daily   docusate sodium  100 mg Oral BID   empagliflozin  10 mg Oral Daily   feeding supplement (GLUCERNA SHAKE)  237 mL Oral BID BM   gabapentin  100 mg Oral TID   heparin  5,000 Units Subcutaneous Q8H   hydrALAZINE  10 mg Oral Q8H   insulin aspart  0-15 Units Subcutaneous TID WC   insulin aspart  0-5 Units Subcutaneous QHS   insulin aspart  4 Units Subcutaneous TID WC   insulin glargine  10 Units  Subcutaneous BID   ipratropium-albuterol  3 mL Nebulization BID   isosorbide mononitrate  30 mg Oral Daily   multivitamin with minerals  1 tablet Oral Daily   mupirocin ointment  1 application Nasal BID   nutrition supplement (JUVEN)  1 packet Oral BID BM   pantoprazole  40 mg Oral BID   povidone-iodine  2 application Topical Once   Ensure Max Protein  11 oz Oral QHS   senna  1 tablet Oral BID   sodium chloride flush  10-40 mL Intracatheter Q12H   spironolactone  12.5 mg Oral Daily   zinc sulfate  220 mg Oral Daily   Continuous Infusions:  sodium chloride 75 mL/hr at 01/21/21 1207    ceFAZolin (ANCEF) IV  lactated ringers 10 mL/hr at 01/21/21 0946   magnesium sulfate bolus IVPB     penicillin g continuous IV infusion 12 Million Units (01/21/21 1329)     LOS: 6 days    Time spent: 35 minutes.     Alba CoryBelkys A Kanoe Wanner, MD [facility]   If 7PM-7AM, please contact night-coverage [email or web address]  01/21/2021, 1:58 PM

## 2021-01-21 NOTE — Op Note (Signed)
   Date of Surgery: 01/21/2021  INDICATIONS: Richard [name] is a 60 y.o.-year-old male who presents with osteomyelitis and necrotic ulcer left hindfoot.Marland Kitchen  PREOPERATIVE DIAGNOSIS: Osteomyelitis and ulceration left hindfoot  POSTOPERATIVE DIAGNOSIS: Same.  PROCEDURE: Transtibial amputation Application of Prevena wound VAC  SURGEON: Lajoyce Corners, M.D.  ANESTHESIA:  general  IV FLUIDS AND URINE: See anesthesia records.  ESTIMATED BLOOD LOSS: See anesthesia records.  COMPLICATIONS: None.  DESCRIPTION OF PROCEDURE: The patient was brought to the operating room after undergoing regional anesthetic. After adequate levels of anesthesia were obtained patient's lower extremity was prepped using DuraPrep draped into a sterile field. A timeout was called. The foot was draped out of the sterile field with impervious stockinette. A transverse incision was made 11 cm distal to the tibial tubercle. This curved proximally and a large posterior flap was created. The tibia was transected 1 cm proximal to the skin incision. The fibula was transected just proximal to the tibial incision. The tibia was beveled anteriorly. A large posterior flap was created. The sciatic nerve was pulled cut and allowed to retract. The vascular bundles were suture ligated with 2-0 silk. The deep and superficial fascial layers were closed using #1 Vicryl. The skin was closed using staples and 2-0 nylon. The wound was covered with a Prevena customizable and arthroform wound VAC.  The dressing was sealed with dermatac there was a good suction fit. A prosthetic shrinker and limb protector were applied. Patient was taken to the PACU in stable condition.   DISCHARGE PLANNING:  Antibiotic duration: 24 hours  Weightbearing: Nonweightbearing on the operative extremity  Pain medication: Opioid pathway  Dressing care/ Wound VAC: Continue wound VAC for 1 week after discharge  Discharge to: Discharge planning based on therapy's recommendations for  possible inpatient rehabilitation, outpatient rehabilitation, or discharge to home with therapy  Follow-up: In the office 1 week post operative.  Richard Baker, MD [facility] 10:57 AM

## 2021-01-21 NOTE — Progress Notes (Signed)
PT Cancellation Note  Patient Details Name: Richard [name] MRN: [id number] DOB: [birth year]   Cancelled Treatment:    Reason Eval/Treat Not Completed: Patient at procedure or test/unavailable.  Pt went to have L BKA and is still out of room.  PT to check back to re-evaluate tomorrow.  Thanks,  Corinna Capra, PT, DPT  Acute Rehabilitation Ortho Tech Supervisor [phone] pager #[phone] office      Lurena Joiner B Antwyne Pingree 01/21/2021, 10:46 AM

## 2021-01-21 NOTE — Anesthesia Postprocedure Evaluation (Signed)
Anesthesia Post Note  Patient: Nechama Guard  Procedure(s) Performed: AMPUTATION BELOW KNEE (Left: Knee) APPLICATION OF WOUND VAC (Left: Knee)     Patient location during evaluation: PACU Anesthesia Type: General Level of consciousness: awake and alert Pain management: pain level controlled Vital Signs Assessment: post-procedure vital signs reviewed and stable Respiratory status: spontaneous breathing, nonlabored ventilation, respiratory function stable and patient connected to nasal cannula oxygen Cardiovascular status: blood pressure returned to baseline and stable Postop Assessment: no apparent nausea or vomiting Anesthetic complications: no   No notable events documented.  Last Vitals:  Vitals:   01/21/21 1050 01/21/21 1110  BP: (!) 141/94 (!) 112/91  Pulse: 88 81  Resp: 20 15  Temp: (!) 36.1 C (!) 36.1 C  SpO2: 100% 97%    Last Pain:  Vitals:   01/21/21 1050  TempSrc:   PainSc: 0-No pain                 Mellody Dance

## 2021-01-21 NOTE — Transfer of Care (Signed)
Immediate Anesthesia Transfer of Care Note  Patient: Richard [name]  Procedure(s) Performed: AMPUTATION BELOW KNEE (Left: Knee) APPLICATION OF WOUND VAC (Left: Knee)  Patient Location: PACU  Anesthesia Type:GA combined with regional for post-op pain  Level of Consciousness: drowsy and patient cooperative  Airway & Oxygen Therapy: Patient Spontanous Breathing and Patient connected to face mask oxygen  Post-op Assessment: Report given to RN and Post -op Vital signs reviewed and stable  Post vital signs: Reviewed and stable  Last Vitals:  Vitals Value Taken Time  BP 141/94 01/21/21 1051  Temp    Pulse 88 01/21/21 1053  Resp 17 01/21/21 1053  SpO2 100 % 01/21/21 1053  Vitals shown include unvalidated device data.  Last Pain:  Vitals:   01/21/21 0945  TempSrc:   PainSc: 0-No pain         Complications: No notable events documented.

## 2021-01-21 NOTE — Interval H&P Note (Signed)
History and Physical Interval Note:  01/21/2021 6:48 AM  Richard [name]  has presented today for surgery, with the diagnosis of gangrene left foot.  The various methods of treatment have been discussed with the patient and family. After consideration of risks, benefits and other options for treatment, the patient has consented to  Procedure(s): AMPUTATION BELOW KNEE (Left) as a surgical intervention.  The patient's history has been reviewed, patient examined, no change in status, stable for surgery.  I have reviewed the patient's chart and labs.  Questions were answered to the patient's satisfaction.     Nadara Mustard

## 2021-01-21 NOTE — Progress Notes (Signed)
Inpatient Diabetes Program Recommendations  AACE/ADA: New Consensus Statement on Inpatient Glycemic Control (2015)  Target Ranges:  Prepandial:   less than 140 mg/dL      Peak postprandial:   less than 180 mg/dL (1-2 hours)      Critically ill patients:  140 - 180 mg/dL   Lab Results  Component Value Date   GLUCAP 159 (H) 01/21/2021   HGBA1C 10.3 (H) 01/17/2021    Review of Glycemic Control Results for Richard [name], Richard [name] (MRN [id number]) as of 01/21/2021 09:37  Ref. Range 01/20/2021 09:35 01/20/2021 12:24 01/20/2021 17:10 01/20/2021 20:25 01/21/2021 04:48  Glucose-Capillary Latest Ref Range: 70 - 99 mg/dL 299 (H) 242 (H) 683 (H) 317 (H) 159 (H)   Diabetes history: Type 2 DM Outpatient Diabetes medications: Humalog U-500 SSI, Jardiance 25 mg QD Current orders for Inpatient glycemic control:  Lantus 10 units BID Novolog 4 units TID meal coverage Novolog 0-15 units & HS Jardiance 10 mg Daily Glucerna bid between meals  Inpatient Diabetes Program Recommendations:     Note NPO for BKA this am Glucose increase after meal intake. When eating consider:  -  increase Novolog meal coverage to 6 units tid if eating >50% of meals  Thanks, Christena Deem RN, MSN, BC-ADM Inpatient Diabetes Coordinator Team Pager [phone] (8a-5p)

## 2021-01-22 ENCOUNTER — Inpatient Hospital Stay (HOSPITAL_COMMUNITY): Payer: Medicare Other

## 2021-01-22 ENCOUNTER — Encounter (HOSPITAL_COMMUNITY): Payer: Self-pay | Admitting: Orthopedic Surgery

## 2021-01-22 DIAGNOSIS — Z95810 Presence of automatic (implantable) cardiac defibrillator: Secondary | ICD-10-CM | POA: Diagnosis not present

## 2021-01-22 DIAGNOSIS — E1152 Type 2 diabetes mellitus with diabetic peripheral angiopathy with gangrene: Secondary | ICD-10-CM | POA: Diagnosis not present

## 2021-01-22 DIAGNOSIS — Z89512 Acquired absence of left leg below knee: Secondary | ICD-10-CM | POA: Diagnosis not present

## 2021-01-22 DIAGNOSIS — J9621 Acute and chronic respiratory failure with hypoxia: Secondary | ICD-10-CM | POA: Diagnosis not present

## 2021-01-22 DIAGNOSIS — J9622 Acute and chronic respiratory failure with hypercapnia: Secondary | ICD-10-CM | POA: Diagnosis not present

## 2021-01-22 DIAGNOSIS — R7881 Bacteremia: Secondary | ICD-10-CM | POA: Diagnosis not present

## 2021-01-22 LAB — BASIC METABOLIC PANEL
Anion gap: 7 (ref 5–15)
BUN: 49 mg/dL — ABNORMAL HIGH (ref 6–20)
CO2: 30 mmol/L (ref 22–32)
Calcium: 8.3 mg/dL — ABNORMAL LOW (ref 8.9–10.3)
Chloride: 94 mmol/L — ABNORMAL LOW (ref 98–111)
Creatinine, Ser: 1.37 mg/dL — ABNORMAL HIGH (ref 0.61–1.24)
GFR, Estimated: 60 mL/min — ABNORMAL LOW (ref >60)
Glucose, Bld: 186 mg/dL — ABNORMAL HIGH (ref 70–99)
Potassium: 4.7 mmol/L (ref 3.5–5.1)
Sodium: 131 mmol/L — ABNORMAL LOW (ref 135–145)

## 2021-01-22 LAB — CBC WITH DIFFERENTIAL/PLATELET
Abs Immature Granulocytes: 0.37 10*3/uL — ABNORMAL HIGH (ref 0.00–0.07)
Basophils Absolute: 0.1 10*3/uL (ref 0.0–0.1)
Basophils Relative: 0 %
Eosinophils Absolute: 0.1 10*3/uL (ref 0.0–0.5)
Eosinophils Relative: 1 %
HCT: 43.7 % (ref 39.0–52.0)
Hemoglobin: 14.2 g/dL (ref 13.0–17.0)
Immature Granulocytes: 2 %
Lymphocytes Relative: 10 %
Lymphs Abs: 1.7 10*3/uL (ref 0.7–4.0)
MCH: 28.7 pg (ref 26.0–34.0)
MCHC: 32.5 g/dL (ref 30.0–36.0)
MCV: 88.5 fL (ref 80.0–100.0)
Monocytes Absolute: 1.9 10*3/uL — ABNORMAL HIGH (ref 0.1–1.0)
Monocytes Relative: 11 %
Neutro Abs: 12.8 10*3/uL — ABNORMAL HIGH (ref 1.7–7.7)
Neutrophils Relative %: 76 %
Platelets: 336 10*3/uL (ref 150–400)
RBC: 4.94 MIL/uL (ref 4.22–5.81)
RDW: 18.8 % — ABNORMAL HIGH (ref 11.5–15.5)
WBC: 16.9 10*3/uL — ABNORMAL HIGH (ref 4.0–10.5)
nRBC: 0 % (ref 0.0–0.2)

## 2021-01-22 LAB — COOXEMETRY PANEL
Carboxyhemoglobin: 1.1 % (ref 0.5–1.5)
Methemoglobin: 1.1 % (ref 0.0–1.5)
O2 Saturation: 89.3 %
Total hemoglobin: 14.7 g/dL (ref 12.0–16.0)

## 2021-01-22 LAB — GLUCOSE, CAPILLARY
Glucose-Capillary: 142 mg/dL — ABNORMAL HIGH (ref 70–99)
Glucose-Capillary: 207 mg/dL — ABNORMAL HIGH (ref 70–99)
Glucose-Capillary: 198 mg/dL — ABNORMAL HIGH (ref 70–99)
Glucose-Capillary: 116 mg/dL — ABNORMAL HIGH (ref 70–99)

## 2021-01-22 LAB — CULTURE, BLOOD (ROUTINE X 2)
Culture: NO GROWTH
Culture: NO GROWTH
Special Requests: ADEQUATE

## 2021-01-22 MED ORDER — BACLOFEN 5 MG HALF TABLET
5.0000 mg | ORAL_TABLET | Freq: Three times a day (TID) | ORAL | Status: DC | PRN
  Administered 2021-01-22 – 2021-01-26 (×6): 5 mg via ORAL
  Filled 2021-01-22 (×7): qty 1

## 2021-01-22 MED ORDER — TORSEMIDE 20 MG PO TABS
40.0000 mg | ORAL_TABLET | Freq: Two times a day (BID) | ORAL | Status: DC
  Administered 2021-01-22 – 2021-01-23 (×4): 40 mg via ORAL
  Filled 2021-01-22 (×4): qty 2

## 2021-01-22 MED ORDER — DULOXETINE HCL 60 MG PO CPEP
60.0000 mg | ORAL_CAPSULE | Freq: Every day | ORAL | Status: DC
  Administered 2021-01-22 – 2021-01-27 (×6): 60 mg via ORAL
  Filled 2021-01-22 (×6): qty 1

## 2021-01-22 MED ORDER — BISOPROLOL FUMARATE 5 MG PO TABS
2.5000 mg | ORAL_TABLET | Freq: Every day | ORAL | Status: DC
  Administered 2021-01-22 – 2021-01-26 (×5): 2.5 mg via ORAL
  Filled 2021-01-22 (×6): qty 1

## 2021-01-22 NOTE — Progress Notes (Signed)
Patient ID: Richard [name], male   DOB: 03/26/1961, 60 y.o.   MRN: [id number] Patient is postoperative day 1 left below the knee amputation.  There is 50 cc in the wound VAC canister with 2 checks.  Anticipate discharge to skilled nursing.  Complains of hiccups this morning.

## 2021-01-22 NOTE — Progress Notes (Signed)
[facility]  Date of Admission:  01/15/2021     Reason for Consult: bacteremia                                  Referring Provider: Sharolyn DouglasEzenduka     Lines:  7/25-c RUE picc     Abx: 7/26-c penicillin iv  7/23-28 azith 7/23-26 ceftriaxone 7/23-26 vanc                                                          Assessment: 60 yo male with dm2, copd on home o2 (2L), nonischemic cardiomyopathy s/p AICD 2018, chronic venous stasis, chronic left heel ulcer, ckd admtited 7/23 with acute (24 hours) myalgia/dyspnea/malaise found to have group g bacteremia/sepsis and heart failure exacerbation and AKI on ckd (baseline cr 1.3-1.5)   7/23 bcx group g strep 1 of 2 set 7/24 bcx negative to date   A picc was placed 7/25 (2 days on appropriate abx and negative repeat bcx)   Group g strep is a beta-hemolytic skin flora strep rather than odontogenic. It is also less likely to cause endocarditis/catheter infection than the viridan streptococcus. And he presented with low burden bacteremia   Patient had chronic left heel unstageable ulcer, which is likely the source of the bacteremia. While we are treating the bacteremia, will need to investigate for deep seated soft tissue/bone infection. He'll benefit from vascular/orthopedics evaluation too as this ulcer is likely non-healing and is source for more bacteremia/pacer seeding complication   Tte so far negative for IE/pacer thrombus. Will need tee. If tee is negative, and the left ankle demonstrate no osteomyelitis/abscess, reasonable to treat for 2 weeks   Suspect this is all group g strep sepsis. PNA dx is confounded but doubt, however reasonable to finish 5 day azithromycin    7/30 assessment 7/28 Tee today ef 15-20%, no LA appendage thrombus. No vegetation on valve/icd Ct 7/28 left ankle suggest necrotizing process Patient underwent left bka on 7/29 with Dr Lajoyce Cornersuda with prompt improvement in leukemoid  reaction  As previously mentioned, even though tee negative, still potentially could be pacer/icd infection even with negative tee, but given this is streptococcal rather than candida/staph aureus reasonable to treat 2 weeks and see (pending r/o osteomyelitis of the left heel)  Can finish 14 day abx from 7/29 for group g strep bacteremia   Plan: Continue penicillin g iv; plan 7 day from 7/29-8/12 If patient is discharged prior to 8/05, can transition to oral abx with cefadroxil 1 gram PO bid Discussed with primary team Id will sign off  Principal Problem:   Acute on chronic respiratory failure with hypoxia and hypercapnia (HCC) Active Problems:   Hypertension   CKD (chronic kidney disease)   Presence of implantable cardioverter-defibrillator (ICD)   GERD (gastroesophageal reflux disease)   Diabetes mellitus with diabetic neuropathy, with long-term current use of insulin (HCC)   COPD exacerbation (HCC)   Chronic systolic CHF (congestive heart failure) (HCC)   Acute renal failure superimposed on stage 3 chronic kidney disease (HCC)   Elevated troponin   Hyponatremia   Bacteremia   Chronic ulcer of left ankle (HCC)   Severe sepsis without septic shock (  HCC)   Open wound   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HCC)   SOB (shortness of breath)   Allergies  Allergen Reactions   Aspirin Itching   Codeine Hives   Coconut Oil Itching    Scheduled Meds:  allopurinol  100 mg Oral Daily   vitamin C  1,000 mg Oral Daily   aspirin EC  81 mg Oral Daily   atorvastatin  40 mg Oral Daily   busPIRone  5 mg Oral BID   Chlorhexidine Gluconate Cloth  6 each Topical Daily   docusate sodium  100 mg Oral BID   DULoxetine  60 mg Oral Daily   empagliflozin  10 mg Oral Daily   feeding supplement (GLUCERNA SHAKE)  237 mL Oral BID BM   gabapentin  100 mg Oral TID   heparin  5,000 Units Subcutaneous Q8H   hydrALAZINE  10 mg Oral Q8H   insulin aspart  0-15 Units Subcutaneous TID WC   insulin  aspart  0-5 Units Subcutaneous QHS   insulin aspart  4 Units Subcutaneous TID WC   insulin glargine  10 Units Subcutaneous BID   ipratropium-albuterol  3 mL Nebulization BID   isosorbide mononitrate  30 mg Oral Daily   multivitamin with minerals  1 tablet Oral Daily   mupirocin ointment  1 application Nasal BID   nutrition supplement (JUVEN)  1 packet Oral BID BM   pantoprazole  40 mg Oral BID   povidone-iodine  2 application Topical Once   Ensure Max Protein  11 oz Oral QHS   senna  1 tablet Oral BID   sodium chloride flush  10-40 mL Intracatheter Q12H   spironolactone  12.5 mg Oral Daily   torsemide  40 mg Oral BID   zinc sulfate  220 mg Oral Daily   Continuous Infusions:  lactated ringers 10 mL/hr at 01/21/21 0946   magnesium sulfate bolus IVPB     penicillin g continuous IV infusion 12 Million Units (01/22/21 1143)   PRN Meds:.acetaminophen, albuterol, alum & mag hydroxide-simeth, baclofen, bisacodyl, guaiFENesin-dextromethorphan, hydrALAZINE, HYDROcodone-acetaminophen, HYDROcodone-acetaminophen, labetalol, magnesium sulfate bolus IVPB, metoprolol tartrate, morphine injection, ondansetron, phenol, polyethylene glycol, potassium chloride, sodium chloride flush   SUBJECTIVE: Doing well S/p left bka 7/29 Wbc markedly down No n/v/diarrhea   Review of Systems: ROS All other ROS was negative, except mentioned above     OBJECTIVE: Vitals:   01/22/21 0400 01/22/21 0800 01/22/21 0815 01/22/21 1120  BP: 125/81   126/88  Pulse: 72 84  85  Resp: 14 17  20   Temp: (!) 97.5 F (36.4 C) 98.7 F (37.1 C)  98.4 F (36.9 C)  TempSrc: Oral Oral  Axillary  SpO2: 100%  97% 100%  Weight: 121.6 kg     Height:       Body mass index is 31.79 kg/m.  Physical Exam General/constitutional: no distress, pleasant HEENT: Normocephalic, PER, Conj Clear, EOMI, Oropharynx clear Neck supple CV: rrr no mrg Lungs: clear to auscultation, normal respiratory effort Abd: Soft, Nontender Ext:  no edema Skin: No Rash Neuro: nonfocal MSK: s/p left bka; dressing c/d   Central line presence: rue picc site no tenderness/purulence    Lab Results Lab Results  Component Value Date   WBC 16.9 (H) 01/22/2021   HGB 14.2 01/22/2021   HCT 43.7 01/22/2021   MCV 88.5 01/22/2021   PLT 336 01/22/2021    Lab Results  Component Value Date   CREATININE 1.37 (H) 01/22/2021   BUN 49 (H)  01/22/2021   NA 131 (L) 01/22/2021   K 4.7 01/22/2021   CL 94 (L) 01/22/2021   CO2 30 01/22/2021    Lab Results  Component Value Date   ALT 24 01/21/2021   AST 24 01/21/2021   ALKPHOS 83 01/21/2021   BILITOT 0.7 01/21/2021      Microbiology: Recent Results (from the past 240 hour(s))  Culture, blood (routine x 2)     Status: None   Collection Time: 01/15/21  3:41 PM   Specimen: BLOOD RIGHT HAND  Result Value Ref Range Status   Specimen Description BLOOD RIGHT HAND  Final   Special Requests   Final    BOTTLES DRAWN AEROBIC AND ANAEROBIC Blood Culture adequate volume   Culture   Final    NO GROWTH 5 DAYS Performed at [facility], [address], [place], [place] [address]    Report Status 01/20/2021 FINAL  Final  Resp Panel by RT-PCR (Flu A&B, Covid) Nasopharyngeal Swab     Status: None   Collection Time: 01/15/21  3:45 PM   Specimen: Nasopharyngeal Swab; Nasopharyngeal(NP) swabs in vial transport medium  Result Value Ref Range Status   SARS Coronavirus 2 by RT PCR NEGATIVE NEGATIVE Final    Comment: (NOTE) SARS-CoV-2 target nucleic acids are NOT DETECTED.  The SARS-CoV-2 RNA is generally detectable in upper respiratory specimens during the acute phase of infection. The lowest concentration of SARS-CoV-2 viral copies this assay can detect is 138 copies/mL. A negative result does not preclude SARS-Cov-2 infection and should not be used as the sole basis for treatment or other patient management decisions. A negative result may occur with  improper specimen collection/handling,  submission of specimen other than nasopharyngeal swab, presence of viral mutation(s) within the areas targeted by this assay, and inadequate number of viral copies(<138 copies/mL). A negative result must be combined with clinical observations, patient history, and epidemiological information. The expected result is Negative.  Fact Sheet for Patients:  BloggerCourse.com  Fact Sheet for Healthcare Providers:  SeriousBroker.it  This test is no t yet approved or cleared by the [place] FDA and  has been authorized for detection and/or diagnosis of SARS-CoV-2 by FDA under an Emergency Use Authorization (EUA). This EUA will remain  in effect (meaning this test can be used) for the duration of the COVID-19 declaration under Section 564(b)(1) of the Act, 21 U.S.C.section 360bbb-3(b)(1), unless the authorization is terminated  or revoked sooner.       Influenza A by PCR NEGATIVE NEGATIVE Final   Influenza B by PCR NEGATIVE NEGATIVE Final    Comment: (NOTE) The Xpert Xpress SARS-CoV-2/FLU/RSV plus assay is intended as an aid in the diagnosis of influenza from Nasopharyngeal swab specimens and should not be used as a sole basis for treatment. Nasal washings and aspirates are unacceptable for Xpert Xpress SARS-CoV-2/FLU/RSV testing.  Fact Sheet for Patients: BloggerCourse.com  Fact Sheet for Healthcare Providers: SeriousBroker.it  This test is not yet approved or cleared by the [place] FDA and has been authorized for detection and/or diagnosis of SARS-CoV-2 by FDA under an Emergency Use Authorization (EUA). This EUA will remain in effect (meaning this test can be used) for the duration of the COVID-19 declaration under Section 564(b)(1) of the Act, 21 U.S.C. section 360bbb-3(b)(1), unless the authorization is terminated or revoked.  Performed at [facility], [address] [place], [place] [address]   Culture, blood (routine x 2)     Status: Abnormal  Collection Time: 01/15/21  6:05 PM   Specimen: BLOOD LEFT FOREARM  Result Value Ref Range Status   Specimen Description BLOOD LEFT FOREARM  Final   Special Requests   Final    BOTTLES DRAWN AEROBIC AND ANAEROBIC Blood Culture adequate volume   Culture  Setup Time   Final    GRAM POSITIVE COCCI IN CHAINS AEROBIC BOTTLE ONLY CRITICAL RESULT CALLED TO, READ BACK BY AND VERIFIED WITH: PHARM D K.HAMMONS ON [id number] AT 0935 BY E.PARRISH    Culture (A)  Final    STREPTOCOCCUS GROUP G STAPHYLOCOCCUS HOMINIS CRITICAL RESULT CALLED TO, READ BACK BY AND VERIFIED WITH: PHARMD JEREMY F. [id number] FCP THE SIGNIFICANCE OF ISOLATING THIS ORGANISM FROM A SINGLE VENIPUNCTURE CANNOT BE PREDICTED WITHOUT FURTHER CLINICAL AND CULTURE CORRELATION. SUSCEPTIBILITIES AVAILABLE ONLY ON REQUEST. Performed at [facility], [address] [place], [place] [address]    Report Status 01/19/2021 FINAL  Final   Organism ID, Bacteria STREPTOCOCCUS GROUP G  Final      Susceptibility   Streptococcus group g - MIC*    CLINDAMYCIN RESISTANT Resistant     AMPICILLIN <=0.25 SENSITIVE Sensitive     ERYTHROMYCIN 4 RESISTANT Resistant     VANCOMYCIN 0.5 SENSITIVE Sensitive     CEFTRIAXONE <=0.12 SENSITIVE Sensitive     LEVOFLOXACIN 1 SENSITIVE Sensitive     PENICILLIN Value in next row Sensitive      SENSITIVEMIC <=0.06    * STREPTOCOCCUS GROUP G  Blood Culture ID Panel (Reflexed)     Status: Abnormal   Collection Time: 01/15/21  6:05 PM  Result Value Ref Range Status   Enterococcus faecalis NOT DETECTED NOT DETECTED Final   Enterococcus Faecium NOT DETECTED NOT DETECTED Final   Listeria monocytogenes NOT DETECTED NOT DETECTED Final   Staphylococcus species DETECTED (A) NOT DETECTED Final    Comment: CRITICAL RESULT CALLED TO, READ BACK BY AND VERIFIED WITH: PHARM D K.HAMMONS ON [id number] AT 0935 BY E.PARRISH     Staphylococcus aureus (BCID) NOT DETECTED NOT DETECTED Final   Staphylococcus epidermidis NOT DETECTED NOT DETECTED Final   Staphylococcus lugdunensis NOT DETECTED NOT DETECTED Final   Streptococcus species DETECTED (A) NOT DETECTED Final    Comment: Not Enterococcus species, Streptococcus agalactiae, Streptococcus pyogenes, or Streptococcus pneumoniae. CRITICAL RESULT CALLED TO, READ BACK BY AND VERIFIED WITH: PHARM D K.HAMMONS ON [id number] AT 0935 BY E.PARRISH    Streptococcus agalactiae NOT DETECTED NOT DETECTED Final   Streptococcus pneumoniae NOT DETECTED NOT DETECTED Final   Streptococcus pyogenes NOT DETECTED NOT DETECTED Final   A.calcoaceticus-baumannii NOT DETECTED NOT DETECTED Final   Bacteroides fragilis NOT DETECTED NOT DETECTED Final   Enterobacterales NOT DETECTED NOT DETECTED Final   Enterobacter cloacae complex NOT DETECTED NOT DETECTED Final   Escherichia coli NOT DETECTED NOT DETECTED Final   Klebsiella aerogenes NOT DETECTED NOT DETECTED Final   Klebsiella oxytoca NOT DETECTED NOT DETECTED Final   Klebsiella pneumoniae NOT DETECTED NOT DETECTED Final   Proteus species NOT DETECTED NOT DETECTED Final   Salmonella species NOT DETECTED NOT DETECTED Final   Serratia marcescens NOT DETECTED NOT DETECTED Final   Haemophilus influenzae NOT DETECTED NOT DETECTED Final   Neisseria meningitidis NOT DETECTED NOT DETECTED Final   Pseudomonas aeruginosa NOT DETECTED NOT DETECTED Final   Stenotrophomonas maltophilia NOT DETECTED NOT DETECTED Final   Candida albicans NOT DETECTED NOT DETECTED Final   Candida auris NOT DETECTED NOT DETECTED Final   Candida glabrata NOT DETECTED NOT DETECTED  Final   Candida krusei NOT DETECTED NOT DETECTED Final   Candida parapsilosis NOT DETECTED NOT DETECTED Final   Candida tropicalis NOT DETECTED NOT DETECTED Final   Cryptococcus neoformans/gattii NOT DETECTED NOT DETECTED Final    Comment: Performed at [facility], [address],  [place], [place] [address]  MRSA Next Gen by PCR, Nasal     Status: Abnormal   Collection Time: 01/15/21 10:38 PM   Specimen: Nasal Mucosa; Nasal Swab  Result Value Ref Range Status   MRSA by PCR Next Gen DETECTED (A) NOT DETECTED Final    Comment: RESULT CALLED TO, READ BACK BY AND VERIFIED WITH: RN M.SHULL ON [id number] AT 1121 BY E.PARRISH (NOTE) The GeneXpert MRSA Assay (FDA approved for NASAL specimens only), is one component of a comprehensive MRSA colonization surveillance program. It is not intended to diagnose MRSA infection nor to guide or monitor treatment for MRSA infections. Test performance is not FDA approved in patients less than 21 years old. Performed at [facility], [address], [place], [place] [address]   Culture, blood (routine x 2)     Status: None (Preliminary result)   Collection Time: 01/16/21 11:38 AM   Specimen: BLOOD  Result Value Ref Range Status   Specimen Description BLOOD RIGHT ANTECUBITAL  Final   Special Requests   Final    BOTTLES DRAWN AEROBIC ONLY Blood Culture adequate volume   Culture   Final    NO GROWTH 4 DAYS Performed at [facility], [address], [place], [place] [address]    Report Status PENDING  Incomplete  Culture, blood (routine x 2)     Status: None (Preliminary result)   Collection Time: 01/16/21 11:39 AM   Specimen: BLOOD RIGHT HAND  Result Value Ref Range Status   Specimen Description BLOOD RIGHT HAND  Final   Special Requests   Final    BOTTLES DRAWN AEROBIC ONLY Blood Culture results may not be optimal due to an inadequate volume of blood received in culture bottles   Culture   Final    NO GROWTH 4 DAYS Performed at [facility], [address], [place], [place] [address]    Report Status PENDING  Incomplete  Surgical pcr screen     Status: Abnormal   Collection Time: 01/21/21 12:34 AM   Specimen: Nasal Mucosa; Nasal Swab  Result Value Ref Range Status   MRSA, PCR NEGATIVE NEGATIVE Final    Staphylococcus aureus POSITIVE (A) NEGATIVE Final    Comment: (NOTE) The Xpert SA Assay (FDA approved for NASAL specimens in patients 66 years of age and older), is one component of a comprehensive surveillance program. It is not intended to diagnose infection nor to guide or monitor treatment. Performed at [facility], [address], [place], [place] [address]      Serology:   Imaging: If present, new imagings (plain films, ct scans, and mri) have been personally visualized and interpreted; radiology reports have been reviewed. Decision making incorporated into the Impression / Recommendations.  7/23 cxr 1. Cardiomegaly with mild central vascular congestion. 2. No evidence of bowel obstruction.   7/24 tte 1. The anterior,anteroseptal,anterolateral walls are akinetic. The apex  is akinetic. Marland Kitchen Left ventricular ejection fraction, by estimation, is 20 to  25%. The left ventricle has severely decreased function. The left  ventricle demonstrates regional wall  motion abnormalities (see scoring diagram/findings for description). The  left ventricular internal cavity size was moderately  to severely dilated.  Left ventricular diastolic parameters are indeterminate.   2. RV poorly visualized. Grossly appears mildly enlarged with low normal  to mildly decreased function. Right ventricular systolic function was not  well visualized. The right ventricular size is not well visualized.  Tricuspid regurgitation signal is  inadequate for assessing PA pressure.   3. Left atrial size was mildly dilated.   4. The mitral valve is normal in structure. No evidence of mitral valve  regurgitation. No evidence of mitral stenosis.   5. The aortic valve was not well visualized. Aortic valve regurgitation  is not visualized. No aortic stenosis is present  7/28 tee  No endocarditis/icd wires vegetation  Raymondo Band, MD [facility] [phone] pager    01/22/2021, 12:11 PM

## 2021-01-22 NOTE — Progress Notes (Signed)
Physical Therapy Treatment Patient Details Name: Richard [name] MRN: [id number] DOB: 1960/11/01 Today's Date: 01/22/2021    History of Present Illness Pt is a 60 y.o. male admitted 01/15/21 with SOB, body aches. Workup for CHF vs COPD exacerbation. Pt also with chronic BLE wounds followed by wound care.Patient underwent left bka on 7/29  Of note, recent admission 09/2020 with CHF exacerbation. PMH includes DM2, COPD (on 2L O2 baseline), CHF, AICD, CKD, HTN.    PT Comments    PT goals and plan of care updated according to pt's current functional status after L BKA. Pt currently requires significant physical assistance to stand and to transfer. Pt is unable to clear L foot to hop at this time and demonstrates instability when attempting to do so. Pt will benefit from aggressive mobilization and acute PT services to aide in reducing falls risk and increasing independence in functional mobility. PT updates D/C recommendations to CIR at this time.   Follow Up Recommendations  CIR     Equipment Recommendations  Rolling walker with 5" wheels;Wheelchair (measurements PT);3in1 (PT)    Recommendations for Other Services Rehab consult     Precautions / Restrictions Precautions Precautions: Fall;Other (comment) Precaution Comments: monitor vitals Restrictions Weight Bearing Restrictions: Yes LLE Weight Bearing: Non weight bearing    Mobility  Bed Mobility                    Transfers Overall transfer level: Needs assistance Equipment used: Rolling walker (2 wheeled) Transfers: Sit to/from Stand;Lateral/Scoot Transfers Sit to Stand: Mod assist;+2 physical assistance;From elevated surface        Lateral/Scoot Transfers: Min assist;+2 physical assistance;From elevated surface General transfer comment: cues for hand placement and for transfer sequencing  Ambulation/Gait Ambulation/Gait assistance:  (pt takes 2 hops without foot clearance and with assistance) Gait Distance (Feet):  0 Feet             Stairs             Wheelchair Mobility    Modified Rankin (Stroke Patients Only)       Balance Overall balance assessment: Needs assistance Sitting-balance support: No upper extremity supported;Feet supported Sitting balance-Leahy Scale: Good     Standing balance support: Bilateral upper extremity supported Standing balance-Leahy Scale: Poor Standing balance comment: minA with BUE support of RW                            Cognition Arousal/Alertness: Awake/alert Behavior During Therapy: WFL for tasks assessed/performed Overall Cognitive Status: Within Functional Limits for tasks assessed                                        Exercises      General Comments General comments (skin integrity, edema, etc.): VSS on RA      Pertinent Vitals/Pain Pain Assessment: Faces Faces Pain Scale: Hurts little more Pain Location: L residual limb Pain Descriptors / Indicators: Grimacing Pain Intervention(s): Monitored during session    Home Living                      Prior Function            PT Goals (current goals can now be found in the care plan section) Acute Rehab PT Goals Patient Stated Goal: to get better PT Goal Formulation: With patient  Time For Goal Achievement: 02/05/21 Potential to Achieve Goals: Good Progress towards PT goals: Goals downgraded-see care plan    Frequency    Min 3X/week      PT Plan Discharge plan needs to be updated    Co-evaluation PT/OT/SLP Co-Evaluation/Treatment: Yes Reason for Co-Treatment: Complexity of the patient's impairments (multi-system involvement);For patient/therapist safety;To address functional/ADL transfers PT goals addressed during session: Mobility/safety with mobility;Balance;Proper use of DME;Strengthening/ROM        AM-PAC PT "6 Clicks" Mobility   Outcome Measure  Help needed turning from your back to your side while in a flat bed without  using bedrails?: A Little Help needed moving from lying on your back to sitting on the side of a flat bed without using bedrails?: A Little Help needed moving to and from a bed to a chair (including a wheelchair)?: Total Help needed standing up from a chair using your arms (e.g., wheelchair or bedside chair)?: Total Help needed to walk in hospital room?: Total Help needed climbing 3-5 steps with a railing? : Total 6 Click Score: 10    End of Session Equipment Utilized During Treatment: Gait belt Activity Tolerance: Patient tolerated treatment well Patient left: in chair;with call bell/phone within reach Nurse Communication: Mobility status PT Visit Diagnosis: Unsteadiness on feet (R26.81);Other abnormalities of gait and mobility (R26.89);Muscle weakness (generalized) (M62.81);Difficulty in walking, not elsewhere classified (R26.2)     Time: 2951-8841 PT Time Calculation (min) (ACUTE ONLY): 21 min  Charges:  $1 Re-Eval                    Arlyss Gandy, PT, DPT Acute Rehabilitation Pager: [phone]    Arlyss Gandy 01/22/2021, 2:54 PM

## 2021-01-22 NOTE — Progress Notes (Signed)
Progress Note  Patient Name: Richard [name] Date of Encounter: 01/22/2021  [facility] Cardiologist: None   Subjective   Complains of hiccups.  No SOB.    Inpatient Medications    Scheduled Meds:  allopurinol  100 mg Oral Daily   vitamin C  1,000 mg Oral Daily   aspirin EC  81 mg Oral Daily   atorvastatin  40 mg Oral Daily   busPIRone  5 mg Oral BID   Chlorhexidine Gluconate Cloth  6 each Topical Daily   docusate sodium  100 mg Oral BID   DULoxetine  60 mg Oral Daily   empagliflozin  10 mg Oral Daily   feeding supplement (GLUCERNA SHAKE)  237 mL Oral BID BM   gabapentin  100 mg Oral TID   heparin  5,000 Units Subcutaneous Q8H   hydrALAZINE  10 mg Oral Q8H   insulin aspart  0-15 Units Subcutaneous TID WC   insulin aspart  0-5 Units Subcutaneous QHS   insulin aspart  4 Units Subcutaneous TID WC   insulin glargine  10 Units Subcutaneous BID   ipratropium-albuterol  3 mL Nebulization BID   isosorbide mononitrate  30 mg Oral Daily   multivitamin with minerals  1 tablet Oral Daily   mupirocin ointment  1 application Nasal BID   nutrition supplement (JUVEN)  1 packet Oral BID BM   pantoprazole  40 mg Oral BID   povidone-iodine  2 application Topical Once   Ensure Max Protein  11 oz Oral QHS   senna  1 tablet Oral BID   sodium chloride flush  10-40 mL Intracatheter Q12H   spironolactone  12.5 mg Oral Daily   torsemide  40 mg Oral BID   zinc sulfate  220 mg Oral Daily   Continuous Infusions:  lactated ringers 10 mL/hr at 01/21/21 0946   magnesium sulfate bolus IVPB     penicillin g continuous IV infusion 12 Million Units (01/22/21 1143)   PRN Meds: acetaminophen, albuterol, alum & mag hydroxide-simeth, baclofen, bisacodyl, guaiFENesin-dextromethorphan, hydrALAZINE, HYDROcodone-acetaminophen, HYDROcodone-acetaminophen, labetalol, magnesium sulfate bolus IVPB, metoprolol tartrate, morphine injection, ondansetron, phenol, polyethylene glycol, potassium chloride, sodium  chloride flush   Vital Signs    Vitals:   01/22/21 0400 01/22/21 0800 01/22/21 0815 01/22/21 1120  BP: 125/81   126/88  Pulse: 72 84  85  Resp: 14 17  20   Temp: (!) 97.5 F (36.4 C) 98.7 F (37.1 C)  98.4 F (36.9 C)  TempSrc: Oral Oral  Axillary  SpO2: 100%  97% 100%  Weight: 121.6 kg     Height:        Intake/Output Summary (Last 24 hours) at 01/22/2021 1201 Last data filed at 01/22/2021 0451 Gross per 24 hour  Intake 727.66 ml  Output 1050 ml  Net -322.34 ml   Last 3 Weights 01/22/2021 01/21/2021 01/20/2021  Weight (lbs) 268 lb 1.3 oz 254 lb 3.2 oz 255 lb 6.4 oz  Weight (kg) 121.6 kg 115.304 kg 115.849 kg      Telemetry    Sinus rhythm.  PVCs - Personally Reviewed  ECG    Sinus rhythm.  Rate 86 bpm.  Right axis deviation.  IVCD.- Personally Reviewed  Physical Exam   VS:  BP 126/88 (BP Location: Left Arm)   Pulse 85   Temp 98.4 F (36.9 C) (Axillary) Comment (Src): taken axillary due to pt being on cpap  Resp 20   Ht 6\' 5"  (1.956 m)   Wt 121.6 kg  SpO2 100%   BMI 31.79 kg/m  , BMI Body mass index is 31.79 kg/m. GENERAL:  Appears uncomfortable.  Frequent hiccuping. HEENT: Pupils equal round and reactive, fundi not visualized, oral mucosa unremarkable NECK:  No jugular venous distention, waveform within normal limits, carotid upstroke brisk and symmetric, no bruits LUNGS:  Clear to auscultation bilaterally HEART:  RRR.  PMI not displaced or sustained,S1 and S2 within normal limits, no S3, no S4, no clicks, no rubs, no murmurs ABD:  Flat, positive bowel sounds normal in frequency in pitch, no bruits, no rebound, no guarding, no midline pulsatile mass, no hepatomegaly, no splenomegaly EXT: L BKA.  + Right lower extremity edema no cyanosis no clubbing SKIN:  No rashes no nodules NEURO:  Cranial nerves II through XII grossly intact, motor grossly intact throughout [facility]:  Cognitively intact, oriented to person place and time   Labs    High Sensitivity  Troponin:   Recent Labs  Lab 01/15/21 1545 01/15/21 1805  TROPONINIHS 37* 37*      Chemistry Recent Labs  Lab 01/19/21 0500 01/19/21 0634 01/20/21 0357 01/21/21 0332 01/21/21 0446 01/22/21 0334  NA 129*   < > 131* 127* 131* 131*  K >7.5*   < > 4.5 7.5* 4.7 4.7  CL 90*   < > 89* 87* 92* 94*  CO2 30   < > 30 30 32 30  GLUCOSE 253*   < > 181* 260* 160* 186*  BUN 65*   < > 63* 57* 57* 49*  CREATININE 1.57*   < > 1.75* 1.57* 1.45* 1.37*  CALCIUM 8.5*   < > 8.7* 8.7* 8.9 8.3*  PROT 8.1  --  7.9 7.9  --   --   ALBUMIN 2.2*  --  2.1* 2.0*  --   --   AST 26  --  21 24  --   --   ALT 25  --  22 24  --   --   ALKPHOS 70  --  65 83  --   --   BILITOT 1.4*  --  1.4* 0.7  --   --   GFRNONAA 51*   < > 45* 51* 56* 60*  ANIONGAP 9   < > 12 10 7 7    < > = values in this interval not displayed.     Hematology Recent Labs  Lab 01/20/21 0357 01/21/21 0332 01/22/21 0334  WBC 34.1* 30.7* 16.9*  RBC 5.60 5.21 4.94  HGB 16.1 14.9 14.2  HCT 49.1 45.4 43.7  MCV 87.7 87.1 88.5  MCH 28.8 28.6 28.7  MCHC 32.8 32.8 32.5  RDW 19.4* 18.7* 18.8*  PLT 255 313 336    BNP Recent Labs  Lab 01/15/21 1545  BNP 532.8*     DDimer No results for input(s): DDIMER in the last 168 hours.   Radiology    CT ANKLE LEFT WO CONTRAST  Result Date: 01/21/2021 CLINICAL DATA:  Non-pressure chronic ulcer, lower limb evaluate for osteomyelitis, dm2 and chronic ulcer left heel EXAM: CT OF THE LEFT ANKLE WITHOUT CONTRAST TECHNIQUE: Multidetector CT imaging of the left ankle was performed according to the standard protocol. Multiplanar CT image reconstructions were also generated. COMPARISON:  None. FINDINGS: Bones/Joint/Cartilage There is a nondisplaced anterior calcaneal process fracture along the lateral aspect. There is a plantar soft tissue wound in gas along the heel. There is minimal cortical irregularity, sclerosis, and periosteal reaction at the adjacent plantar aspect of the calcaneus. Tiny cortical  defect  on coronal image 84. This is process is located near the plantar fascia insertion. There is mild tibiotalar, mild posterior subtalar, and moderate middle subtalar degenerative change. There is mild talonavicular and calcaneocuboid degenerative change. Ligaments Suboptimally assessed by CT. Muscles and Tendons No acute tendon injury on noncontrast CT. Soft tissues There is ankle and foot soft tissue swelling. IMPRESSION: Plantar soft tissue wound with adjacent swelling and soft tissue gas concerning for necrotizing infection, with possible early changes of osteomyelitis involving the plantar aspect of the calcaneus near the plantar fascia insertion. No visible abscess within the field of view. Further evaluation with MRI could be considered to determine extent of involvement. Nondisplaced anterior calcaneal process fracture along the lateral aspect. Diffuse ankle and foot soft tissue swelling. Electronically Signed   By: Caprice Renshaw   On: 01/21/2021 07:16    Cardiac Studies   Echo 01/16/21:  1. The anterior,anteroseptal,anterolateral walls are akinetic. The apex  is akinetic. Marland Kitchen Left ventricular ejection fraction, by estimation, is 20 to  25%. The left ventricle has severely decreased function. The left  ventricle demonstrates regional wall  motion abnormalities (see scoring diagram/findings for description). The  left ventricular internal cavity size was moderately to severely dilated.  Left ventricular diastolic parameters are indeterminate.   2. RV poorly visualized. Grossly appears mildly enlarged with low normal  to mildly decreased function. Right ventricular systolic function was not  well visualized. The right ventricular size is not well visualized.  Tricuspid regurgitation signal is  inadequate for assessing PA pressure.   3. Left atrial size was mildly dilated.   4. The mitral valve is normal in structure. No evidence of mitral valve  regurgitation. No evidence of mitral stenosis.    5. The aortic valve was not well visualized. Aortic valve regurgitation  is not visualized. No aortic stenosis is present.   Patient Profile     60 y.o. male with chronic systolic and diastolic heart failure, CAD, COPD, OSA not on CPAP, polycythemia, hypertension and hyperlipidemia admitted with acute on chronic hypoxic respiratory failure due to exacerbation of COPD and acute on chronic heart failure.  Hospitalization has been complicated by sepsis/bacteremia and right lower extremity wound now status post left BKA.  Assessment & Plan    #Biventricular heart failure: #Essential hypertension: #GERD hypercholesterolemia: LVEF this admission 15 to 20%.  He also has moderate right RV dysfunction.  Diuresis was held for acute on chronic renal failure.  Right atrial pressure was 5 mmHg.  Torsemide has been resumed today as his renal function is improving.  Hemodynamically stable.  He is status post ICD.  There was some concern for amyloid.  PYP scan was negative for ATTR amyloid in 2021.  Continue hydralazine, Imdur, spironolactone, and torsemide.  At home he is also on [place], Entresto, and bisoprolol.  BP is stable.  We will resume bisoprolol.  # CAD:  Stable.  Continue aspirin, atorvastatin, and resume bisoprolol as above.      For questions or updates, please contact [facility] Please consult [email or web address] for contact info under        Signed, Chilton Si, MD  01/22/2021, 12:01 PM

## 2021-01-22 NOTE — Progress Notes (Signed)
PROGRESS NOTE    Richard [name]  EYC:[id number] DOB: 1960-10-19 DOA: 01/15/2021 PCP: Ananias Pilgrim, MD   Brief Narrative: 60 year old with past medical history significant for diabetes type 2, COPD on chronic oxygen 2 L, systolic heart failure status post AICD, venous stasis ulcer, hypertension, CKD, pulmonary nodule Richard [name] patient with increased shortness of breath, no fever no chest pain.  Noted to have oxygen saturation 87 on room air with EMS.  4 L [place] applied by EMS and gave 1 nebulizer, Solu-Medrol.  CCM was consulted for worsening hypoxia and patient being placed on BiPAP.  Patient was admitted for further management.    Assessment & Plan:   Principal Problem:   Acute on chronic respiratory failure with hypoxia and hypercapnia (HCC) Active Problems:   Hypertension   CKD (chronic kidney disease)   Presence of implantable cardioverter-defibrillator (ICD)   GERD (gastroesophageal reflux disease)   Diabetes mellitus with diabetic neuropathy, with long-term current use of insulin (HCC)   COPD exacerbation (HCC)   Chronic systolic CHF (congestive heart failure) (HCC)   Acute renal failure superimposed on stage 3 chronic kidney disease (HCC)   Elevated troponin   Hyponatremia   Bacteremia   Chronic ulcer of left ankle (HCC)   Severe sepsis without septic shock (HCC)   Open wound   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HCC)   SOB (shortness of breath)   S/P BKA (below knee amputation), left (HCC)  1-Acute on Chronic Hypoxic Respiratory Failure, acute on chronic combined heart failure, AICD -Patient required BiPAP on admission.  Currently on nasal cannula oxygen for mentation -Elevation of BNP and flat troponin.  Chest x-ray with mild vascular congestion. -Echo showed ejection fraction 20 to 25% -Cardiology  consulted appreciate recommendation. -Continue with hydralazine, Imdur, torsemide and  spironolactone.  -Bisoprolol, digoxin and Entresto on hold -Plan to resume  torsemide today.  Using CIPAP, feels he is breathing better with CPAP. Of note his oxygen level was NL on RA>  Plan to get follow up chest x ray.   Severe sepsis likely secondary to group G bacteremia Likely secondary to chronic left heel unstageable ulcer.  -Patient tachycardic, tachypneic L a more than 2 leukocytosis -Cultures x2 gross strep group G, Staphylococcus  -Repeated blood culture 7/24: No growth to date -ID Following and recommend TEE. TEE negative -ABI requested result, but no results was received -Dr Lajoyce Corners consulted. Recommended BKA.  -CT;  Plantar soft tissue wound with adjacent swelling and soft tissue gas concerning for necrotizing infection, with possible early changes of osteomyelitis involving the plantar aspect of the calcaneus near the plantar fascia insertion. No visible abscess within the field of view. Further evaluation with MRI could be considered to determine extent of involvement. Nondisplaced anterior calcaneal process fracture along the lateral aspect. -Continue with IV penicillin G. Needs 7 days from 7/29---8/05 if discharge prior to 8/05 we can discharge him on Cefadroxil 1 gr BID.  -If Leukocytosis  fail to improved, might need to look for other sources.  -SP BKA 7/29  Possible COPD exacerbation Azithromycin, On prednisone.  continue with  nebulizer treatments  Diabetes type 2: Continue with Lantus and sliding scale insulin A1c 10  AKI on CKD stage III: Baseline creatinine 1.5  CAD/HLD Status post PCI Continue with aspirin and statin  Chronic venous stasis ulcer: Status postdebridement of left foot wound on 01/13/2021 at OSH Wound care following.  Hiccup:  Started baclofen, on PPI to twice daily Started on Gabapentin.   Depression;  Resume Cymbalta and buspar.   Nutrition Problem: Increased nutrient needs Etiology: wound healing    Signs/Symptoms: estimated needs    Interventions: Glucerna shake, MVI, Premier Protein  Estimated body  mass index is 31.79 kg/m as calculated from the following:   Height as of this encounter: 6\' 5"  (1.956 m).   Weight as of this encounter: 121.6 kg.   DVT prophylaxis: Heparin Code Status: Full code Family Communication: Care discussed with patient, wife over phone Disposition Plan:  Status is: Inpatient  Remains inpatient appropriate because:IV treatments appropriate due to intensity of illness or inability to take PO  Dispo: The patient is from: Home              Anticipated d/c is to: Home              Patient currently is not medically stable to d/c.   Difficult to place patient No        Consultants:  Cardiology ID  Procedures:    Antimicrobials:    Subjective: He is complaining of hiccup. CPAP helps prevent Hiccups.  Denies dyspnea.   Objective: Vitals:   01/22/21 0400 01/22/21 0800 01/22/21 0815 01/22/21 1120  BP: 125/81   126/88  Pulse: 72 84  85  Resp: 14 17  20   Temp: (!) 97.5 F (36.4 C) 98.7 F (37.1 C)  98.4 F (36.9 C)  TempSrc: Oral Oral  Axillary  SpO2: 100%  97% 100%  Weight: 121.6 kg     Height:        Intake/Output Summary (Last 24 hours) at 01/22/2021 1405 Last data filed at 01/22/2021 0451 Gross per 24 hour  Intake 727.66 ml  Output 1050 ml  Net -322.34 ml    Filed Weights   01/20/21 0400 01/21/21 0428 01/22/21 0400  Weight: 115.8 kg 115.3 kg 121.6 kg    Examination:  General exam: NAD Respiratory system: CTA Cardiovascular system: S 1, S 2 RRR Gastrointestinal system: BS present, soft , nt Central nervous system: ALert Extremities: S/P BKA wound vac and limb protector.   Data Reviewed: I have personally reviewed following labs and imaging studies  CBC: Recent Labs  Lab 01/18/21 0325 01/19/21 0500 01/20/21 0357 01/21/21 0332 01/22/21 0334  WBC 23.1* 29.3* 34.1* 30.7* 16.9*  NEUTROABS 19.6* 25.6* 29.9* 26.4* 12.8*  HGB 17.1* 16.3 16.1 14.9 14.2  HCT 51.8 50.8 49.1 45.4 43.7  MCV 88.7 88.0 87.7 87.1 88.5  PLT  348 323 255 313 336    Basic Metabolic Panel: Recent Labs  Lab 01/15/21 2307 01/16/21 0323 01/17/21 0538 01/19/21 0634 01/20/21 0357 01/21/21 0332 01/21/21 0446 01/22/21 0334  NA  --  135   < > 133* 131* 127* 131* 131*  K  --  5.2*   < > 4.6 4.5 7.5* 4.7 4.7  CL  --  93*   < > 95* 89* 87* 92* 94*  CO2  --  27   < > 30 30 30  32 30  GLUCOSE  --  454*   < > 144* 181* 260* 160* 186*  BUN  --  77*   < > 64* 63* 57* 57* 49*  CREATININE  --  2.37*   < > 1.46* 1.75* 1.57* 1.45* 1.37*  CALCIUM  --  8.9   < > 8.7* 8.7* 8.7* 8.9 8.3*  MG 2.5* 2.7*  --   --   --   --   --   --   PHOS 6.2* 6.9*  --   --   --   --   --   --    < > =  values in this interval not displayed.    GFR: Estimated Creatinine Clearance: 84.9 mL/min (A) (by C-G formula based on SCr of 1.37 mg/dL (H)). Liver Function Tests: Recent Labs  Lab 01/17/21 0538 01/18/21 0325 01/19/21 0500 01/20/21 0357 01/21/21 0332  AST 20 18 26 21 24   ALT 22 19 25 22 24   ALKPHOS 71 63 70 65 83  BILITOT 0.7 0.9 1.4* 1.4* 0.7  PROT 7.4 7.9 8.1 7.9 7.9  ALBUMIN 2.0* 2.2* 2.2* 2.1* 2.0*    No results for input(s): LIPASE, AMYLASE in the last 168 hours. No results for input(s): AMMONIA in the last 168 hours. Coagulation Profile: No results for input(s): INR, PROTIME in the last 168 hours. Cardiac Enzymes: Recent Labs  Lab 01/15/21 2307  CKTOTAL 101    BNP (last 3 results) No results for input(s): PROBNP in the last 8760 hours. HbA1C: No results for input(s): HGBA1C in the last 72 hours.  CBG: Recent Labs  Lab 01/21/21 1135 01/21/21 1613 01/21/21 2227 01/22/21 0517 01/22/21 1129  GLUCAP 70 249* 185* 142* 207*    Lipid Profile: No results for input(s): CHOL, HDL, LDLCALC, TRIG, CHOLHDL, LDLDIRECT in the last 72 hours. Thyroid Function Tests: No results for input(s): TSH, T4TOTAL, FREET4, T3FREE, THYROIDAB in the last 72 hours. Anemia Panel: No results for input(s): VITAMINB12, FOLATE, FERRITIN, TIBC, IRON,  RETICCTPCT in the last 72 hours. Sepsis Labs: Recent Labs  Lab 01/15/21 1805 01/15/21 2307 01/16/21 0323 01/17/21 0538 01/18/21 0325  PROCALCITON  --  12.40  --  6.72 4.37  LATICACIDVEN 2.1* 1.8 2.1* 1.8  --      Recent Results (from the past 240 hour(s))  Culture, blood (routine x 2)     Status: None   Collection Time: 01/15/21  3:41 PM   Specimen: BLOOD RIGHT HAND  Result Value Ref Range Status   Specimen Description BLOOD RIGHT HAND  Final   Special Requests   Final    BOTTLES DRAWN AEROBIC AND ANAEROBIC Blood Culture adequate volume   Culture   Final    NO GROWTH 5 DAYS Performed at [facility], [address] [place], [place] [address]    Report Status 01/20/2021 FINAL  Final  Resp Panel by RT-PCR (Flu A&B, Covid) Nasopharyngeal Swab     Status: None   Collection Time: 01/15/21  3:45 PM   Specimen: Nasopharyngeal Swab; Nasopharyngeal(NP) swabs in vial transport medium  Result Value Ref Range Status   SARS Coronavirus 2 by RT PCR NEGATIVE NEGATIVE Final    Comment: (NOTE) SARS-CoV-2 target nucleic acids are NOT DETECTED.  The SARS-CoV-2 RNA is generally detectable in upper respiratory specimens during the acute phase of infection. The lowest concentration of SARS-CoV-2 viral copies this assay can detect is 138 copies/mL. A negative result does not preclude SARS-Cov-2 infection and should not be used as the sole basis for treatment or other patient management decisions. A negative result may occur with  improper specimen collection/handling, submission of specimen other than nasopharyngeal swab, presence of viral mutation(s) within the areas targeted by this assay, and inadequate number of viral copies(<138 copies/mL). A negative result must be combined with clinical observations, patient history, and epidemiological information. The expected result is Negative.  Fact Sheet for Patients:  BloggerCourse.comhttps://www.fda.gov/media/152166/download  Fact Sheet for  Healthcare Providers:  SeriousBroker.ithttps://www.fda.gov/media/152162/download  This test is no t yet approved or cleared by the [place] FDA and  has been authorized for detection and/or diagnosis of SARS-CoV-2 by FDA under an  Emergency Use Authorization (EUA). This EUA will remain  in effect (meaning this test can be used) for the duration of the COVID-19 declaration under Section 564(b)(1) of the Act, 21 U.S.C.section 360bbb-3(b)(1), unless the authorization is terminated  or revoked sooner.       Influenza A by PCR NEGATIVE NEGATIVE Final   Influenza B by PCR NEGATIVE NEGATIVE Final    Comment: (NOTE) The Xpert Xpress SARS-CoV-2/FLU/RSV plus assay is intended as an aid in the diagnosis of influenza from Nasopharyngeal swab specimens and should not be used as a sole basis for treatment. Nasal washings and aspirates are unacceptable for Xpert Xpress SARS-CoV-2/FLU/RSV testing.  Fact Sheet for Patients: BloggerCourse.com  Fact Sheet for Healthcare Providers: SeriousBroker.it  This test is not yet approved or cleared by the [place] FDA and has been authorized for detection and/or diagnosis of SARS-CoV-2 by FDA under an Emergency Use Authorization (EUA). This EUA will remain in effect (meaning this test can be used) for the duration of the COVID-19 declaration under Section 564(b)(1) of the Act, 21 U.S.C. section 360bbb-3(b)(1), unless the authorization is terminated or revoked.  Performed at [facility], [address] [place], [place] [address]   Culture, blood (routine x 2)     Status: Abnormal   Collection Time: 01/15/21  6:05 PM   Specimen: BLOOD LEFT FOREARM  Result Value Ref Range Status   Specimen Description BLOOD LEFT FOREARM  Final   Special Requests   Final    BOTTLES DRAWN AEROBIC AND ANAEROBIC Blood Culture adequate volume   Culture  Setup Time   Final    GRAM POSITIVE COCCI IN CHAINS AEROBIC BOTTLE  ONLY CRITICAL RESULT CALLED TO, READ BACK BY AND VERIFIED WITH: PHARM D K.HAMMONS ON [id number] AT 0935 BY E.PARRISH    Culture (A)  Final    STREPTOCOCCUS GROUP G STAPHYLOCOCCUS HOMINIS CRITICAL RESULT CALLED TO, READ BACK BY AND VERIFIED WITH: PHARMD JEREMY F. [phone] FCP THE SIGNIFICANCE OF ISOLATING THIS ORGANISM FROM A SINGLE VENIPUNCTURE CANNOT BE PREDICTED WITHOUT FURTHER CLINICAL AND CULTURE CORRELATION. SUSCEPTIBILITIES AVAILABLE ONLY ON REQUEST. Performed at [facility], [address], [place], [place] [address]    Report Status 01/19/2021 FINAL  Final   Organism ID, Bacteria STREPTOCOCCUS GROUP G  Final      Susceptibility   Streptococcus group g - MIC*    CLINDAMYCIN RESISTANT Resistant     AMPICILLIN <=0.25 SENSITIVE Sensitive     ERYTHROMYCIN 4 RESISTANT Resistant     VANCOMYCIN 0.5 SENSITIVE Sensitive     CEFTRIAXONE <=0.12 SENSITIVE Sensitive     LEVOFLOXACIN 1 SENSITIVE Sensitive     PENICILLIN Value in next row Sensitive      SENSITIVEMIC <=0.06    * STREPTOCOCCUS GROUP G  Blood Culture ID Panel (Reflexed)     Status: Abnormal   Collection Time: 01/15/21  6:05 PM  Result Value Ref Range Status   Enterococcus faecalis NOT DETECTED NOT DETECTED Final   Enterococcus Faecium NOT DETECTED NOT DETECTED Final   Listeria monocytogenes NOT DETECTED NOT DETECTED Final   Staphylococcus species DETECTED (A) NOT DETECTED Final    Comment: CRITICAL RESULT CALLED TO, READ BACK BY AND VERIFIED WITH: PHARM D K.HAMMONS ON [id number] AT 0935 BY E.PARRISH    Staphylococcus aureus (BCID) NOT DETECTED NOT DETECTED Final   Staphylococcus epidermidis NOT DETECTED NOT DETECTED Final   Staphylococcus lugdunensis NOT DETECTED NOT DETECTED Final   Streptococcus species DETECTED (A) NOT DETECTED Final  Comment: Not Enterococcus species, Streptococcus agalactiae, Streptococcus pyogenes, or Streptococcus pneumoniae. CRITICAL RESULT CALLED TO, READ BACK BY AND VERIFIED WITH: PHARM  D K.HAMMONS ON [id number] AT 0935 BY E.PARRISH    Streptococcus agalactiae NOT DETECTED NOT DETECTED Final   Streptococcus pneumoniae NOT DETECTED NOT DETECTED Final   Streptococcus pyogenes NOT DETECTED NOT DETECTED Final   A.calcoaceticus-baumannii NOT DETECTED NOT DETECTED Final   Bacteroides fragilis NOT DETECTED NOT DETECTED Final   Enterobacterales NOT DETECTED NOT DETECTED Final   Enterobacter cloacae complex NOT DETECTED NOT DETECTED Final   Escherichia coli NOT DETECTED NOT DETECTED Final   Klebsiella aerogenes NOT DETECTED NOT DETECTED Final   Klebsiella oxytoca NOT DETECTED NOT DETECTED Final   Klebsiella pneumoniae NOT DETECTED NOT DETECTED Final   Proteus species NOT DETECTED NOT DETECTED Final   Salmonella species NOT DETECTED NOT DETECTED Final   Serratia marcescens NOT DETECTED NOT DETECTED Final   Haemophilus influenzae NOT DETECTED NOT DETECTED Final   Neisseria meningitidis NOT DETECTED NOT DETECTED Final   Pseudomonas aeruginosa NOT DETECTED NOT DETECTED Final   Stenotrophomonas maltophilia NOT DETECTED NOT DETECTED Final   Candida albicans NOT DETECTED NOT DETECTED Final   Candida auris NOT DETECTED NOT DETECTED Final   Candida glabrata NOT DETECTED NOT DETECTED Final   Candida krusei NOT DETECTED NOT DETECTED Final   Candida parapsilosis NOT DETECTED NOT DETECTED Final   Candida tropicalis NOT DETECTED NOT DETECTED Final   Cryptococcus neoformans/gattii NOT DETECTED NOT DETECTED Final    Comment: Performed at [facility], [address], [place], [place] [address]  MRSA Next Gen by PCR, Nasal     Status: Abnormal   Collection Time: 01/15/21 10:38 PM   Specimen: Nasal Mucosa; Nasal Swab  Result Value Ref Range Status   MRSA by PCR Next Gen DETECTED (A) NOT DETECTED Final    Comment: RESULT CALLED TO, READ BACK BY AND VERIFIED WITH: RN M.SHULL ON [id number] AT 1121 BY E.PARRISH (NOTE) The GeneXpert MRSA Assay (FDA approved for NASAL specimens only), is one  component of a comprehensive MRSA colonization surveillance program. It is not intended to diagnose MRSA infection nor to guide or monitor treatment for MRSA infections. Test performance is not FDA approved in patients less than 79 years old. Performed at [facility], [address] [place], [place] [address]   Culture, blood (routine x 2)     Status: None (Preliminary result)   Collection Time: 01/16/21 11:38 AM   Specimen: BLOOD  Result Value Ref Range Status   Specimen Description BLOOD RIGHT ANTECUBITAL  Final   Special Requests   Final    BOTTLES DRAWN AEROBIC ONLY Blood Culture adequate volume   Culture   Final    NO GROWTH 4 DAYS Performed at [facility], [address], [place], [place] [address]    Report Status PENDING  Incomplete  Culture, blood (routine x 2)     Status: None (Preliminary result)   Collection Time: 01/16/21 11:39 AM   Specimen: BLOOD RIGHT HAND  Result Value Ref Range Status   Specimen Description BLOOD RIGHT HAND  Final   Special Requests   Final    BOTTLES DRAWN AEROBIC ONLY Blood Culture results may not be optimal due to an inadequate volume of blood received in culture bottles   Culture   Final    NO GROWTH 4 DAYS Performed at [facility], [address] [place], [place] [address]  Report Status PENDING  Incomplete  Surgical pcr screen     Status: Abnormal   Collection Time: 01/21/21 12:34 AM   Specimen: Nasal Mucosa; Nasal Swab  Result Value Ref Range Status   MRSA, PCR NEGATIVE NEGATIVE Final   Staphylococcus aureus POSITIVE (A) NEGATIVE Final    Comment: (NOTE) The Xpert SA Assay (FDA approved for NASAL specimens in patients 42 years of age and older), is one component of a comprehensive surveillance program. It is not intended to diagnose infection nor to guide or monitor treatment. Performed at [facility], [address] [place], [place] [address]           Radiology Studies: CT ANKLE LEFT WO  CONTRAST  Result Date: 01/21/2021 CLINICAL DATA:  Non-pressure chronic ulcer, lower limb evaluate for osteomyelitis, dm2 and chronic ulcer left heel EXAM: CT OF THE LEFT ANKLE WITHOUT CONTRAST TECHNIQUE: Multidetector CT imaging of the left ankle was performed according to the standard protocol. Multiplanar CT image reconstructions were also generated. COMPARISON:  None. FINDINGS: Bones/Joint/Cartilage There is a nondisplaced anterior calcaneal process fracture along the lateral aspect. There is a plantar soft tissue wound in gas along the heel. There is minimal cortical irregularity, sclerosis, and periosteal reaction at the adjacent plantar aspect of the calcaneus. Tiny cortical defect on coronal image 84. This is process is located near the plantar fascia insertion. There is mild tibiotalar, mild posterior subtalar, and moderate middle subtalar degenerative change. There is mild talonavicular and calcaneocuboid degenerative change. Ligaments Suboptimally assessed by CT. Muscles and Tendons No acute tendon injury on noncontrast CT. Soft tissues There is ankle and foot soft tissue swelling. IMPRESSION: Plantar soft tissue wound with adjacent swelling and soft tissue gas concerning for necrotizing infection, with possible early changes of osteomyelitis involving the plantar aspect of the calcaneus near the plantar fascia insertion. No visible abscess within the field of view. Further evaluation with MRI could be considered to determine extent of involvement. Nondisplaced anterior calcaneal process fracture along the lateral aspect. Diffuse ankle and foot soft tissue swelling. Electronically Signed   By: Caprice Renshaw   On: 01/21/2021 07:16        Scheduled Meds:  allopurinol  100 mg Oral Daily   vitamin C  1,000 mg Oral Daily   aspirin EC  81 mg Oral Daily   atorvastatin  40 mg Oral Daily   bisoprolol  2.5 mg Oral Daily   busPIRone  5 mg Oral BID   Chlorhexidine Gluconate Cloth  6 each Topical Daily    docusate sodium  100 mg Oral BID   DULoxetine  60 mg Oral Daily   empagliflozin  10 mg Oral Daily   feeding supplement (GLUCERNA SHAKE)  237 mL Oral BID BM   gabapentin  100 mg Oral TID   heparin  5,000 Units Subcutaneous Q8H   hydrALAZINE  10 mg Oral Q8H   insulin aspart  0-15 Units Subcutaneous TID WC   insulin aspart  0-5 Units Subcutaneous QHS   insulin aspart  4 Units Subcutaneous TID WC   insulin glargine  10 Units Subcutaneous BID   ipratropium-albuterol  3 mL Nebulization BID   isosorbide mononitrate  30 mg Oral Daily   multivitamin with minerals  1 tablet Oral Daily   mupirocin ointment  1 application Nasal BID   nutrition supplement (JUVEN)  1 packet Oral BID BM   pantoprazole  40 mg Oral BID   povidone-iodine  2 application Topical Once  Ensure Max Protein  11 oz Oral QHS   senna  1 tablet Oral BID   sodium chloride flush  10-40 mL Intracatheter Q12H   spironolactone  12.5 mg Oral Daily   torsemide  40 mg Oral BID   zinc sulfate  220 mg Oral Daily   Continuous Infusions:  lactated ringers 10 mL/hr at 01/21/21 0946   magnesium sulfate bolus IVPB     penicillin g continuous IV infusion 12 Million Units (01/22/21 1143)     LOS: 7 days    Time spent: 35 minutes.     Alba Cory, MD [facility]   If 7PM-7AM, please contact night-coverage [email or web address]  01/22/2021, 2:05 PM

## 2021-01-22 NOTE — Progress Notes (Signed)
OT Re-Evaluation  Pt initially with hiccups/abnormal breathing pattern.Encouraged pursed lip breathing and relaxation techniques in addition to distracting pt by talking about his family. Once seated EOB and hiccups no longer present, pt very determined to mobilize OOB and "walk" so he could "go home". Mod A +2 to stand and unable to take steps at this time. Completed squat pivot transfer with min A +2 toward R. Max A with LB ADL. Feel pt is an excellent candidate for CIR to maximize functional level of independence and facilitate safe DC home. Pt very appreciative. Will follow acutely.     01/22/21 1600  OT Visit Information  Last OT Received On 01/22/21  Assistance Needed +2 (chair follow or +2 for gait training)  PT/OT/SLP Co-Evaluation/Treatment Yes  Reason for Co-Treatment Complexity of the patient's impairments (multi-system involvement);For patient/therapist safety;To address functional/ADL transfers  OT goals addressed during session ADL's and self-care  History of Present Illness Pt is a 60 y.o. male admitted 01/15/21 with SOB, body aches. Workup for CHF vs COPD exacerbation. 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 PMH includes DM2, COPD (on 2L O2 baseline), CHF, AICD, CKD, HTN.  Precautions  Precautions Fall;Other (comment)  Precaution Comments monitor vitals  Required Braces or Orthoses Other Brace  Other Brace shrinker adn limb protector  Restrictions  Weight Bearing Restrictions Yes  LLE Weight Bearing NWB  Home Living  Family/patient expects to be discharged to: Private residence  Living Arrangements Spouse/significant other;Children  Available Help at Discharge Family;Available PRN/intermittently  Type of Home House  Home Access Stairs to enter  Entrance Stairs-Number of Steps 3  Entrance Stairs-Rails None  Home Layout Two level;Able to live on main level with  bedroom/bathroom;Full bath on main level  Bathroom Nurse, children's Yes  How Accessible Accessible via walker  Home Equipment Cane - single point  Prior Function  Level of Independence Independent with assistive device(s)  Communication  Communication No difficulties  Pain Assessment  Pain Assessment 0-10  Pain Score 6  Pain Location L residual limb  Pain Descriptors / Indicators Grimacing  Pain Intervention(s) Limited activity within patient's tolerance;Premedicated before session;Relaxation  Cognition  Arousal/Alertness Awake/alert  Behavior During Therapy Anxious  Overall Cognitive Status Within Functional Limits for tasks assessed  Awareness Emergent  General Comments hiccups/abnormal breathing pattern; encouraged pursed lip breathing and distracted pt with conversation about his family  Upper Extremity Assessment  Upper Extremity Assessment Generalized weakness (B hand numbness; worse ove the last 8 months; drops utensils)  Lower Extremity Assessment  Lower Extremity Assessment Defer to PT evaluation  Cervical / Trunk Assessment  Cervical / Trunk Assessment Normal  ADL  Overall ADL's  Needs assistance/impaired  Upper Body Bathing Set up;Sitting  Lower Body Bathing Maximal assistance;Sitting/lateral leans  Upper Body Dressing  Minimal assistance;Sitting  Lower Body Dressing Maximal assistance;Sit to/from stand;Sitting/lateral leans  Toileting- Clothing Manipulation and Hygiene Maximal assistance  Functional mobility during ADLs Moderate assistance (squat pivot)  General ADL Comments significant decline in ability to complete ADL tasks s/p BKA  Bed Mobility  Overal bed mobility Needs Assistance  Supine to sit Min assist  Transfers  Overall transfer level Needs assistance  Equipment used Rolling walker (2 wheeled)  Transfers Sit to/from Stand;Lateral/Scoot Transfers  Sit to Stand Mod assist;+2 physical  assistance;From elevated surface   Lateral/Scoot Transfers Min assist;+2 physical assistance;From elevated surface  General transfer comment cues for hand placement and for transfer sequencing  Balance  Overall balance assessment Needs assistance  Sitting-balance support No upper extremity supported;Feet supported  Sitting balance-Leahy Scale Good  Standing balance support Bilateral upper extremity supported  Standing balance-Leahy Scale Poor  Standing balance comment minA with BUE support of RW  General Comments  General comments (skin integrity, edema, etc.) VSS on 2L O2; began education on importance of terminal knee extension  OT - End of Session  Equipment Utilized During Treatment Rolling walker;Oxygen  Activity Tolerance Patient tolerated treatment well  Patient left in chair;with call bell/phone within reach;with chair alarm set  Nurse Communication Mobility status  OT Assessment  OT Recommendation/Assessment Patient needs continued OT Services  OT Visit Diagnosis Unsteadiness on feet (R26.81);Other abnormalities of gait and mobility (R26.89);Muscle weakness (generalized) (M62.81);Pain  Pain - Right/Left Left  Pain - part of body Leg  OT Problem List Decreased activity tolerance;Impaired balance (sitting and/or standing);Decreased cognition;Decreased knowledge of use of DME or AE;Cardiopulmonary status limiting activity;Decreased safety awareness;Decreased strength;Decreased range of motion;Impaired sensation;Impaired UE functional use;Pain  OT Plan  OT Frequency (ACUTE ONLY) Min 2X/week  OT Treatment/Interventions (ACUTE ONLY) Self-care/ADL training;Therapeutic exercise;Energy conservation;DME and/or AE instruction;Therapeutic activities;Patient/family education;Balance training  AM-PAC OT "6 Clicks" Daily Activity Outcome Measure (Version 2)  Help from another person eating meals? 3  Help from another person taking care of personal grooming? 3  Help from another person  toileting, which includes using toliet, bedpan, or urinal? 2  Help from another person bathing (including washing, rinsing, drying)? 2  Help from another person to put on and taking off regular upper body clothing? 3  Help from another person to put on and taking off regular lower body clothing? 2  6 Click Score 15  Progressive Mobility  What is the highest level of mobility based on the progressive mobility assessment? Level 2 (Chairfast) - Balance while sitting on edge of bed and cannot stand  Mobility Out of bed to chair with meals  OT Recommendation  Recommendations for Other Services Rehab consult  Follow Up Recommendations CIR  OT Equipment 3 in 1 bedside commode;Wheelchair (measurements OT);Wheelchair cushion (measurements OT);Tub/shower bench  Individuals Consulted  Consulted and Agree with Results and Recommendations Patient  Acute Rehab OT Goals  Patient Stated Goal to get better  OT Goal Formulation With patient  Time For Goal Achievement 02/05/21  Potential to Achieve Goals Good  OT Time Calculation  OT Start Time (ACUTE ONLY) 1229  OT Stop Time (ACUTE ONLY) 1318  OT Time Calculation (min) 49 min  OT General Charges  $OT Visit 1 Visit  OT Evaluation  $OT Re-eval 1 Re-eval  OT Treatments  $Self Care/Home Management  8-22 mins  Written Expression  Dominant Hand Right  Luisa Dago, OT/L   Acute OT Clinical Specialist Acute Rehabilitation Services Pager [phone] Office [phone]

## 2021-01-23 ENCOUNTER — Other Ambulatory Visit (HOSPITAL_COMMUNITY): Payer: Medicare Other

## 2021-01-23 DIAGNOSIS — N1831 Chronic kidney disease, stage 3a: Secondary | ICD-10-CM | POA: Diagnosis not present

## 2021-01-23 DIAGNOSIS — N179 Acute kidney failure, unspecified: Secondary | ICD-10-CM | POA: Diagnosis not present

## 2021-01-23 DIAGNOSIS — J9621 Acute and chronic respiratory failure with hypoxia: Secondary | ICD-10-CM | POA: Diagnosis not present

## 2021-01-23 DIAGNOSIS — J9622 Acute and chronic respiratory failure with hypercapnia: Secondary | ICD-10-CM | POA: Diagnosis not present

## 2021-01-23 LAB — CBC WITH DIFFERENTIAL/PLATELET
Abs Immature Granulocytes: 0.43 10*3/uL — ABNORMAL HIGH (ref 0.00–0.07)
Basophils Absolute: 0 10*3/uL (ref 0.0–0.1)
Basophils Relative: 0 %
Eosinophils Absolute: 0 10*3/uL (ref 0.0–0.5)
Eosinophils Relative: 0 %
HCT: 45.7 % (ref 39.0–52.0)
Hemoglobin: 14.8 g/dL (ref 13.0–17.0)
Immature Granulocytes: 2 %
Lymphocytes Relative: 7 %
Lymphs Abs: 1.4 10*3/uL (ref 0.7–4.0)
MCH: 28.4 pg (ref 26.0–34.0)
MCHC: 32.4 g/dL (ref 30.0–36.0)
MCV: 87.5 fL (ref 80.0–100.0)
Monocytes Absolute: 1.9 10*3/uL — ABNORMAL HIGH (ref 0.1–1.0)
Monocytes Relative: 10 %
Neutro Abs: 15.8 10*3/uL — ABNORMAL HIGH (ref 1.7–7.7)
Neutrophils Relative %: 81 %
Platelets: 366 10*3/uL (ref 150–400)
RBC: 5.22 MIL/uL (ref 4.22–5.81)
RDW: 18.6 % — ABNORMAL HIGH (ref 11.5–15.5)
WBC: 19.5 10*3/uL — ABNORMAL HIGH (ref 4.0–10.5)
nRBC: 0 % (ref 0.0–0.2)

## 2021-01-23 LAB — GLUCOSE, CAPILLARY
Glucose-Capillary: 123 mg/dL — ABNORMAL HIGH (ref 70–99)
Glucose-Capillary: 113 mg/dL — ABNORMAL HIGH (ref 70–99)
Glucose-Capillary: 72 mg/dL (ref 70–99)
Glucose-Capillary: 206 mg/dL — ABNORMAL HIGH (ref 70–99)

## 2021-01-23 LAB — BASIC METABOLIC PANEL
Anion gap: 7 (ref 5–15)
BUN: 43 mg/dL — ABNORMAL HIGH (ref 6–20)
CO2: 33 mmol/L — ABNORMAL HIGH (ref 22–32)
Calcium: 8.7 mg/dL — ABNORMAL LOW (ref 8.9–10.3)
Chloride: 92 mmol/L — ABNORMAL LOW (ref 98–111)
Creatinine, Ser: 1.27 mg/dL — ABNORMAL HIGH (ref 0.61–1.24)
GFR, Estimated: >60 mL/min (ref >60)
Glucose, Bld: 119 mg/dL — ABNORMAL HIGH (ref 70–99)
Potassium: 4.8 mmol/L (ref 3.5–5.1)
Sodium: 132 mmol/L — ABNORMAL LOW (ref 135–145)

## 2021-01-23 LAB — COOXEMETRY PANEL
Carboxyhemoglobin: 1 % (ref 0.5–1.5)
Methemoglobin: 1 % (ref 0.0–1.5)
O2 Saturation: 56.5 %
Total hemoglobin: 15.2 g/dL (ref 12.0–16.0)

## 2021-01-23 NOTE — Progress Notes (Signed)
PROGRESS NOTE    Richard [name]  XBJ:[id number] DOB: 15-Nov-1960 DOA: 01/15/2021 PCP: Ananias Pilgrim, MD   Brief Narrative: 60 year old with past medical history significant for diabetes type 2, COPD on chronic oxygen 2 L, systolic heart failure status post AICD, venous stasis ulcer, hypertension, CKD, pulmonary nodule [name] patient with increased shortness of breath, no fever no chest pain.  Noted to have oxygen saturation 87 on room air with EMS.  4 L [place] applied by EMS and gave 1 nebulizer, Solu-Medrol.  CCM was consulted for worsening hypoxia and patient being placed on BiPAP.  Patient was admitted for further management.    Assessment & Plan:   Principal Problem:   Acute on chronic respiratory failure with hypoxia and hypercapnia (HCC) Active Problems:   Hypertension   CKD (chronic kidney disease)   Presence of implantable cardioverter-defibrillator (ICD)   GERD (gastroesophageal reflux disease)   Diabetes mellitus with diabetic neuropathy, with long-term current use of insulin (HCC)   COPD exacerbation (HCC)   Chronic systolic CHF (congestive heart failure) (HCC)   Acute renal failure superimposed on stage 3 chronic kidney disease (HCC)   Elevated troponin   Hyponatremia   Bacteremia   Chronic ulcer of left ankle (HCC)   Severe sepsis without septic shock (HCC)   Open wound   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HCC)   SOB (shortness of breath)   S/P BKA (below knee amputation), left (HCC)  1-Acute on Chronic Hypoxic Respiratory Failure, acute on chronic combined heart failure, AICD -Patient required BiPAP on admission.  Currently on nasal cannula oxygen for mentation -Elevation of BNP and flat troponin.  Chest x-ray with mild vascular congestion. -Echo showed ejection fraction 20 to 25% -Cardiology  consulted appreciate recommendation. -Continue with hydralazine, Imdur, torsemide and  spironolactone.  -Bisoprolol, digoxin and Entresto on hold -Continue with  Torsemide.    Severe sepsis likely secondary to group G bacteremia Likely secondary to chronic left heel unstageable ulcer.  -Patient tachycardic, tachypneic L a more than 2 leukocytosis -Cultures x2 gross strep group G, Staphylococcus  -Repeated blood culture 7/24: No growth to date -ID Following and recommend TEE. TEE negative -ABI requested result, but no results was received -Dr Lajoyce Corners consulted. Recommended BKA.  -CT:  Plantar soft tissue wound with adjacent swelling and soft tissue gas concerning for necrotizing infection, with possible early changes of osteomyelitis involving the plantar aspect of the calcaneus near the plantar fascia insertion. No visible abscess within the field of view. Further evaluation with MRI could be considered to determine extent of involvement. Nondisplaced anterior calcaneal process fracture along the lateral aspect. -Continue with IV penicillin G. Needs 7 days from 7/29---8/05 if discharge prior to 8/05 we can discharge him on Cefadroxil 1 gr BID.  -If Leukocytosis  fail to improved, might need to look for other sources.  -SP BKA 7/29 Plan to get CT abdomen pelvis.   Possible COPD exacerbation Azithromycin, On prednisone.  continue with  nebulizer treatments  Diabetes type 2: Continue with Lantus and sliding scale insulin A1c 10  AKI on CKD stage III: Baseline creatinine 1.5  CAD/HLD Status post PCI Continue with aspirin and statin  Chronic venous stasis ulcer: Status postdebridement of left foot wound on 01/13/2021 at OSH Wound care following.  Hiccup:  Started baclofen, on PPI to twice daily Started on Gabapentin.  Some improvement of Hiccups.  Plan to check CT abdomen pelvis.   Depression;  Resume Cymbalta and buspar.   Nutrition Problem: Increased nutrient  needs Etiology: wound healing    Signs/Symptoms: estimated needs    Interventions: Glucerna shake, MVI, Premier Protein  Estimated body mass index is 31.79 kg/m as  calculated from the following:   Height as of this encounter: 6\' 5"  (1.956 m).   Weight as of this encounter: 121.6 kg.   DVT prophylaxis: Heparin Code Status: Full code Family Communication: Care discussed with patient, wife over phone Disposition Plan:  Status is: Inpatient  Remains inpatient appropriate because:IV treatments appropriate due to intensity of illness or inability to take PO  Dispo: The patient is from: Home              Anticipated d/c is to: Home              Patient currently is not medically stable to d/c.   Difficult to place patient No        Consultants:  Cardiology ID  Procedures:    Antimicrobials:    Subjective: He is sitting recliner. Report hiccups are getting better.   Objective: Vitals:   01/22/21 2336 01/23/21 0120 01/23/21 0430 01/23/21 0857  BP:  109/81 (!) 120/95 (!) 113/96  Pulse: 73  68 84  Resp: (!) 22 15 (!) 22 20  Temp:  98.1 F (36.7 C) 97.7 F (36.5 C) 97.7 F (36.5 C)  TempSrc:  Oral Oral Oral  SpO2: 100%  90%   Weight:      Height:        Intake/Output Summary (Last 24 hours) at 01/23/2021 1042 Last data filed at 01/23/2021 1011 Gross per 24 hour  Intake 1682.2 ml  Output 2950 ml  Net -1267.8 ml    Filed Weights   01/20/21 0400 01/21/21 0428 01/22/21 0400  Weight: 115.8 kg 115.3 kg 121.6 kg    Examination:  General exam: NAD Respiratory system: CTA Cardiovascular system: S 1, S 2 RRR Gastrointestinal system: BS present, soft, nt Central nervous system: Alert Extremities: S/P BKA wound vac and limb protector.   Data Reviewed: I have personally reviewed following labs and imaging studies  CBC: Recent Labs  Lab 01/19/21 0500 01/20/21 0357 01/21/21 0332 01/22/21 0334 01/23/21 0622  WBC 29.3* 34.1* 30.7* 16.9* 19.5*  NEUTROABS 25.6* 29.9* 26.4* 12.8* 15.8*  HGB 16.3 16.1 14.9 14.2 14.8  HCT 50.8 49.1 45.4 43.7 45.7  MCV 88.0 87.7 87.1 88.5 87.5  PLT 323 255 313 336 366    Basic Metabolic  Panel: Recent Labs  Lab 01/20/21 0357 01/21/21 0332 01/21/21 0446 01/22/21 0334 01/23/21 0622  NA 131* 127* 131* 131* 132*  K 4.5 7.5* 4.7 4.7 4.8  CL 89* 87* 92* 94* 92*  CO2 30 30 32 30 33*  GLUCOSE 181* 260* 160* 186* 119*  BUN 63* 57* 57* 49* 43*  CREATININE 1.75* 1.57* 1.45* 1.37* 1.27*  CALCIUM 8.7* 8.7* 8.9 8.3* 8.7*    GFR: Estimated Creatinine Clearance: 91.6 mL/min (A) (by C-G formula based on SCr of 1.27 mg/dL (H)). Liver Function Tests: Recent Labs  Lab 01/17/21 0538 01/18/21 0325 01/19/21 0500 01/20/21 0357 01/21/21 0332  AST 20 18 26 21 24   ALT 22 19 25 22 24   ALKPHOS 71 63 70 65 83  BILITOT 0.7 0.9 1.4* 1.4* 0.7  PROT 7.4 7.9 8.1 7.9 7.9  ALBUMIN 2.0* 2.2* 2.2* 2.1* 2.0*    No results for input(s): LIPASE, AMYLASE in the last 168 hours. No results for input(s): AMMONIA in the last 168 hours. Coagulation Profile: No results for input(s): INR,  PROTIME in the last 168 hours. Cardiac Enzymes: No results for input(s): CKTOTAL, CKMB, CKMBINDEX, TROPONINI in the last 168 hours.  BNP (last 3 results) No results for input(s): PROBNP in the last 8760 hours. HbA1C: No results for input(s): HGBA1C in the last 72 hours.  CBG: Recent Labs  Lab 01/22/21 0517 01/22/21 1129 01/22/21 1616 01/22/21 2050 01/23/21 0610  GLUCAP 142* 207* 198* 116* 123*    Lipid Profile: No results for input(s): CHOL, HDL, LDLCALC, TRIG, CHOLHDL, LDLDIRECT in the last 72 hours. Thyroid Function Tests: No results for input(s): TSH, T4TOTAL, FREET4, T3FREE, THYROIDAB in the last 72 hours. Anemia Panel: No results for input(s): VITAMINB12, FOLATE, FERRITIN, TIBC, IRON, RETICCTPCT in the last 72 hours. Sepsis Labs: Recent Labs  Lab 01/17/21 0538 01/18/21 0325  PROCALCITON 6.72 4.37  LATICACIDVEN 1.8  --      Recent Results (from the past 240 hour(s))  Culture, blood (routine x 2)     Status: None   Collection Time: 01/15/21  3:41 PM   Specimen: BLOOD RIGHT HAND   Result Value Ref Range Status   Specimen Description BLOOD RIGHT HAND  Final   Special Requests   Final    BOTTLES DRAWN AEROBIC AND ANAEROBIC Blood Culture adequate volume   Culture   Final    NO GROWTH 5 DAYS Performed at [facility], [address] [place], [place] [address]    Report Status 01/20/2021 FINAL  Final  Resp Panel by RT-PCR (Flu A&B, Covid) Nasopharyngeal Swab     Status: None   Collection Time: 01/15/21  3:45 PM   Specimen: Nasopharyngeal Swab; Nasopharyngeal(NP) swabs in vial transport medium  Result Value Ref Range Status   SARS Coronavirus 2 by RT PCR NEGATIVE NEGATIVE Final    Comment: (NOTE) SARS-CoV-2 target nucleic acids are NOT DETECTED.  The SARS-CoV-2 RNA is generally detectable in upper respiratory specimens during the acute phase of infection. The lowest concentration of SARS-CoV-2 viral copies this assay can detect is 138 copies/mL. A negative result does not preclude SARS-Cov-2 infection and should not be used as the sole basis for treatment or other patient management decisions. A negative result may occur with  improper specimen collection/handling, submission of specimen other than nasopharyngeal swab, presence of viral mutation(s) within the areas targeted by this assay, and inadequate number of viral copies(<138 copies/mL). A negative result must be combined with clinical observations, patient history, and epidemiological information. The expected result is Negative.  Fact Sheet for Patients:  BloggerCourse.com  Fact Sheet for Healthcare Providers:  SeriousBroker.it  This test is no t yet approved or cleared by the [place] FDA and  has been authorized for detection and/or diagnosis of SARS-CoV-2 by FDA under an Emergency Use Authorization (EUA). This EUA will remain  in effect (meaning this test can be used) for the duration of the COVID-19 declaration under Section 564(b)(1)  of the Act, 21 U.S.C.section 360bbb-3(b)(1), unless the authorization is terminated  or revoked sooner.       Influenza A by PCR NEGATIVE NEGATIVE Final   Influenza B by PCR NEGATIVE NEGATIVE Final    Comment: (NOTE) The Xpert Xpress SARS-CoV-2/FLU/RSV plus assay is intended as an aid in the diagnosis of influenza from Nasopharyngeal swab specimens and should not be used as a sole basis for treatment. Nasal washings and aspirates are unacceptable for Xpert Xpress SARS-CoV-2/FLU/RSV testing.  Fact Sheet for Patients: BloggerCourse.com  Fact Sheet for Healthcare Providers: SeriousBroker.it  This test is not yet  approved or cleared by the [place] and has been authorized for detection and/or diagnosis of SARS-CoV-2 by FDA under an Emergency Use Authorization (EUA). This EUA will remain in effect (meaning this test can be used) for the duration of the COVID-19 declaration under Section 564(b)(1) of the Act, 21 U.S.C. section 360bbb-3(b)(1), unless the authorization is terminated or revoked.  Performed at [facility], [address], [place], [place] [address]   Culture, blood (routine x 2)     Status: Abnormal   Collection Time: 01/15/21  6:05 PM   Specimen: BLOOD LEFT FOREARM  Result Value Ref Range Status   Specimen Description BLOOD LEFT FOREARM  Final   Special Requests   Final    BOTTLES DRAWN AEROBIC AND ANAEROBIC Blood Culture adequate volume   Culture  Setup Time   Final    GRAM POSITIVE COCCI IN CHAINS AEROBIC BOTTLE ONLY CRITICAL RESULT CALLED TO, READ BACK BY AND VERIFIED WITH: PHARM D K.HAMMONS ON [id number] AT 0935 BY E.PARRISH    Culture (A)  Final    STREPTOCOCCUS GROUP G STAPHYLOCOCCUS HOMINIS CRITICAL RESULT CALLED TO, READ BACK BY AND VERIFIED WITH: PHARMD JEREMY F. [id number] FCP THE SIGNIFICANCE OF ISOLATING THIS ORGANISM FROM A SINGLE VENIPUNCTURE CANNOT BE PREDICTED WITHOUT FURTHER  CLINICAL AND CULTURE CORRELATION. SUSCEPTIBILITIES AVAILABLE ONLY ON REQUEST. Performed at [facility], [address] [place], [place] [address]    Report Status 01/19/2021 FINAL  Final   Organism ID, Bacteria STREPTOCOCCUS GROUP G  Final      Susceptibility   Streptococcus group g - MIC*    CLINDAMYCIN RESISTANT Resistant     AMPICILLIN <=0.25 SENSITIVE Sensitive     ERYTHROMYCIN 4 RESISTANT Resistant     VANCOMYCIN 0.5 SENSITIVE Sensitive     CEFTRIAXONE <=0.12 SENSITIVE Sensitive     LEVOFLOXACIN 1 SENSITIVE Sensitive     PENICILLIN Value in next row Sensitive      SENSITIVEMIC <=0.06    * STREPTOCOCCUS GROUP G  Blood Culture ID Panel (Reflexed)     Status: Abnormal   Collection Time: 01/15/21  6:05 PM  Result Value Ref Range Status   Enterococcus faecalis NOT DETECTED NOT DETECTED Final   Enterococcus Faecium NOT DETECTED NOT DETECTED Final   Listeria monocytogenes NOT DETECTED NOT DETECTED Final   Staphylococcus species DETECTED (A) NOT DETECTED Final    Comment: CRITICAL RESULT CALLED TO, READ BACK BY AND VERIFIED WITH: PHARM D K.HAMMONS ON [id number] AT 0935 BY E.PARRISH    Staphylococcus aureus (BCID) NOT DETECTED NOT DETECTED Final   Staphylococcus epidermidis NOT DETECTED NOT DETECTED Final   Staphylococcus lugdunensis NOT DETECTED NOT DETECTED Final   Streptococcus species DETECTED (A) NOT DETECTED Final    Comment: Not Enterococcus species, Streptococcus agalactiae, Streptococcus pyogenes, or Streptococcus pneumoniae. CRITICAL RESULT CALLED TO, READ BACK BY AND VERIFIED WITH: PHARM D K.HAMMONS ON [id number] AT 0935 BY E.PARRISH    Streptococcus agalactiae NOT DETECTED NOT DETECTED Final   Streptococcus pneumoniae NOT DETECTED NOT DETECTED Final   Streptococcus pyogenes NOT DETECTED NOT DETECTED Final   A.calcoaceticus-baumannii NOT DETECTED NOT DETECTED Final   Bacteroides fragilis NOT DETECTED NOT DETECTED Final   Enterobacterales NOT DETECTED NOT DETECTED  Final   Enterobacter cloacae complex NOT DETECTED NOT DETECTED Final   Escherichia coli NOT DETECTED NOT DETECTED Final   Klebsiella aerogenes NOT DETECTED NOT DETECTED Final   Klebsiella oxytoca NOT DETECTED NOT DETECTED Final   Klebsiella pneumoniae NOT DETECTED  NOT DETECTED Final   Proteus species NOT DETECTED NOT DETECTED Final   Salmonella species NOT DETECTED NOT DETECTED Final   Serratia marcescens NOT DETECTED NOT DETECTED Final   Haemophilus influenzae NOT DETECTED NOT DETECTED Final   Neisseria meningitidis NOT DETECTED NOT DETECTED Final   Pseudomonas aeruginosa NOT DETECTED NOT DETECTED Final   Stenotrophomonas maltophilia NOT DETECTED NOT DETECTED Final   Candida albicans NOT DETECTED NOT DETECTED Final   Candida auris NOT DETECTED NOT DETECTED Final   Candida glabrata NOT DETECTED NOT DETECTED Final   Candida krusei NOT DETECTED NOT DETECTED Final   Candida parapsilosis NOT DETECTED NOT DETECTED Final   Candida tropicalis NOT DETECTED NOT DETECTED Final   Cryptococcus neoformans/gattii NOT DETECTED NOT DETECTED Final    Comment: Performed at [facility], [address] [place], [place] [address]  MRSA Next Gen by PCR, Nasal     Status: Abnormal   Collection Time: 01/15/21 10:38 PM   Specimen: Nasal Mucosa; Nasal Swab  Result Value Ref Range Status   MRSA by PCR Next Gen DETECTED (A) NOT DETECTED Final    Comment: RESULT CALLED TO, READ BACK BY AND VERIFIED WITH: RN M.SHULL ON [id number] AT 1121 BY E.PARRISH (NOTE) The GeneXpert MRSA Assay (FDA approved for NASAL specimens only), is one component of a comprehensive MRSA colonization surveillance program. It is not intended to diagnose MRSA infection nor to guide or monitor treatment for MRSA infections. Test performance is not FDA approved in patients less than 59 years old. Performed at [facility], [address] [place], [place] [address]   Culture, blood (routine x 2)     Status: None   Collection  Time: 01/16/21 11:38 AM   Specimen: BLOOD  Result Value Ref Range Status   Specimen Description BLOOD RIGHT ANTECUBITAL  Final   Special Requests   Final    BOTTLES DRAWN AEROBIC ONLY Blood Culture adequate volume   Culture   Final    NO GROWTH 6 DAYS Performed at [facility], [address] [place], [place] [address]    Report Status 01/22/2021 FINAL  Final  Culture, blood (routine x 2)     Status: None   Collection Time: 01/16/21 11:39 AM   Specimen: BLOOD RIGHT HAND  Result Value Ref Range Status   Specimen Description BLOOD RIGHT HAND  Final   Special Requests   Final    BOTTLES DRAWN AEROBIC ONLY Blood Culture results may not be optimal due to an inadequate volume of blood received in culture bottles   Culture   Final    NO GROWTH 6 DAYS Performed at [facility], [address] [place], [place] [address]    Report Status 01/22/2021 FINAL  Final  Surgical pcr screen     Status: Abnormal   Collection Time: 01/21/21 12:34 AM   Specimen: Nasal Mucosa; Nasal Swab  Result Value Ref Range Status   MRSA, PCR NEGATIVE NEGATIVE Final   Staphylococcus aureus POSITIVE (A) NEGATIVE Final    Comment: (NOTE) The Xpert SA Assay (FDA approved for NASAL specimens in patients 59 years of age and older), is one component of a comprehensive surveillance program. It is not intended to diagnose infection nor to guide or monitor treatment. Performed at [facility], [address] [place], [place] [address]           Radiology Studies: DG CHEST PORT 1 VIEW  Result Date: 01/22/2021 CLINICAL DATA:  Shortness of breath. EXAM: PORTABLE CHEST 1 VIEW COMPARISON:  January 15, 2021 FINDINGS: Stable cardiomegaly with stable AICD device. No pneumothorax. A new right PICC line terminates in the central SVC. No pulmonary nodules or masses. No focal infiltrates. No acute abnormalities. IMPRESSION: Support apparatus as above.  Cardiomegaly.  No acute abnormalities. Electronically  Signed   By: Gerome Sam III M.D   On: 01/22/2021 16:54        Scheduled Meds:  allopurinol  100 mg Oral Daily   vitamin C  1,000 mg Oral Daily   aspirin EC  81 mg Oral Daily   atorvastatin  40 mg Oral Daily   bisoprolol  2.5 mg Oral Daily   busPIRone  5 mg Oral BID   Chlorhexidine Gluconate Cloth  6 each Topical Daily   docusate sodium  100 mg Oral BID   DULoxetine  60 mg Oral Daily   empagliflozin  10 mg Oral Daily   feeding supplement (GLUCERNA SHAKE)  237 mL Oral BID BM   gabapentin  100 mg Oral TID   heparin  5,000 Units Subcutaneous Q8H   hydrALAZINE  10 mg Oral Q8H   insulin aspart  0-15 Units Subcutaneous TID WC   insulin aspart  0-5 Units Subcutaneous QHS   insulin aspart  4 Units Subcutaneous TID WC   insulin glargine  10 Units Subcutaneous BID   ipratropium-albuterol  3 mL Nebulization BID   isosorbide mononitrate  30 mg Oral Daily   multivitamin with minerals  1 tablet Oral Daily   nutrition supplement (JUVEN)  1 packet Oral BID BM   pantoprazole  40 mg Oral BID   povidone-iodine  2 application Topical Once   Ensure Max Protein  11 oz Oral QHS   senna  1 tablet Oral BID   sodium chloride flush  10-40 mL Intracatheter Q12H   spironolactone  12.5 mg Oral Daily   torsemide  40 mg Oral BID   zinc sulfate  220 mg Oral Daily   Continuous Infusions:  lactated ringers 10 mL/hr at 01/21/21 0946   magnesium sulfate bolus IVPB     penicillin g continuous IV infusion 12 Million Units (01/23/21 0014)     LOS: 8 days    Time spent: 35 minutes.     Alba Cory, MD [facility]   If 7PM-7AM, please contact night-coverage [email or web address]  01/23/2021, 10:42 AM

## 2021-01-23 NOTE — Progress Notes (Signed)
Progress Note  Patient Name: Richard [name] Date of Encounter: 01/23/2021  [facility] Cardiologist: None   Subjective   Feeling better.  Denies chest pain or shortness of breath.  Hiccups are improving.  Inpatient Medications    Scheduled Meds:  allopurinol  100 mg Oral Daily   vitamin C  1,000 mg Oral Daily   aspirin EC  81 mg Oral Daily   atorvastatin  40 mg Oral Daily   bisoprolol  2.5 mg Oral Daily   busPIRone  5 mg Oral BID   Chlorhexidine Gluconate Cloth  6 each Topical Daily   docusate sodium  100 mg Oral BID   DULoxetine  60 mg Oral Daily   empagliflozin  10 mg Oral Daily   feeding supplement (GLUCERNA SHAKE)  237 mL Oral BID BM   gabapentin  100 mg Oral TID   heparin  5,000 Units Subcutaneous Q8H   hydrALAZINE  10 mg Oral Q8H   insulin aspart  0-15 Units Subcutaneous TID WC   insulin aspart  0-5 Units Subcutaneous QHS   insulin aspart  4 Units Subcutaneous TID WC   insulin glargine  10 Units Subcutaneous BID   ipratropium-albuterol  3 mL Nebulization BID   isosorbide mononitrate  30 mg Oral Daily   multivitamin with minerals  1 tablet Oral Daily   mupirocin ointment  1 application Nasal BID   nutrition supplement (JUVEN)  1 packet Oral BID BM   pantoprazole  40 mg Oral BID   povidone-iodine  2 application Topical Once   Ensure Max Protein  11 oz Oral QHS   senna  1 tablet Oral BID   sodium chloride flush  10-40 mL Intracatheter Q12H   spironolactone  12.5 mg Oral Daily   torsemide  40 mg Oral BID   zinc sulfate  220 mg Oral Daily   Continuous Infusions:  lactated ringers 10 mL/hr at 01/21/21 0946   magnesium sulfate bolus IVPB     penicillin g continuous IV infusion 12 Million Units (01/23/21 0014)   PRN Meds: acetaminophen, albuterol, alum & mag hydroxide-simeth, baclofen, bisacodyl, guaiFENesin-dextromethorphan, hydrALAZINE, HYDROcodone-acetaminophen, HYDROcodone-acetaminophen, labetalol, magnesium sulfate bolus IVPB, metoprolol tartrate, morphine  injection, ondansetron, phenol, polyethylene glycol, potassium chloride, sodium chloride flush   Vital Signs    Vitals:   01/22/21 2336 01/23/21 0120 01/23/21 0430 01/23/21 0857  BP:  109/81 (!) 120/95 (!) 113/96  Pulse: 73  68 84  Resp: (!) 22 15 (!) 22 20  Temp:  98.1 F (36.7 C) 97.7 F (36.5 C) 97.7 F (36.5 C)  TempSrc:  Oral Oral Oral  SpO2: 100%  90%   Weight:      Height:        Intake/Output Summary (Last 24 hours) at 01/23/2021 0902 Last data filed at 01/23/2021 0600 Gross per 24 hour  Intake 1765.2 ml  Output 3250 ml  Net -1484.8 ml   Last 3 Weights 01/23/2021 01/22/2021 01/21/2021  Weight (lbs) (No Data) 268 lb 1.3 oz 254 lb 3.2 oz  Weight (kg) (No Data) 121.6 kg 115.304 kg      Telemetry    Sinus rhythm.  - Personally Reviewed  ECG    Sinus rhythm.  Rate 86 bpm.  Right axis deviation.  IVCD.- Personally Reviewed  Physical Exam   VS:  BP (!) 113/96 (BP Location: Left Arm)   Pulse 84   Temp 97.7 F (36.5 C) (Oral)   Resp 20   Ht 6\' 5"  (1.956 m)  Wt 121.6 kg   SpO2 90%   BMI 31.79 kg/m  , BMI Body mass index is 31.79 kg/m. GENERAL: Well-appearing.  No acute distress. HEENT: Pupils equal round and reactive, fundi not visualized, oral mucosa unremarkable NECK:  No jugular venous distention, waveform within normal limits, carotid upstroke brisk and symmetric, no bruits LUNGS:  Clear to auscultation bilaterally HEART:  RRR.  PMI not displaced or sustained,S1 and S2 within normal limits, no S3, no S4, no clicks, no rubs, no murmurs ABD:  Flat, positive bowel sounds normal in frequency in pitch, no bruits, no rebound, no guarding, no midline pulsatile mass, no hepatomegaly, no splenomegaly EXT: L BKA.  + Right lower extremity edema no cyanosis no clubbing SKIN:  No rashes no nodules NEURO:  Cranial nerves II through XII grossly intact, motor grossly intact throughout [facility]:  Cognitively intact, oriented to person place and time   Labs    High  Sensitivity Troponin:   Recent Labs  Lab 01/15/21 1545 01/15/21 1805  TROPONINIHS 37* 37*      Chemistry Recent Labs  Lab 01/19/21 0500 01/19/21 0634 01/20/21 0357 01/21/21 0332 01/21/21 0446 01/22/21 0334 01/23/21 0622  NA 129*   < > 131* 127* 131* 131* 132*  K >7.5*   < > 4.5 7.5* 4.7 4.7 4.8  CL 90*   < > 89* 87* 92* 94* 92*  CO2 30   < > 30 30 32 30 33*  GLUCOSE 253*   < > 181* 260* 160* 186* 119*  BUN 65*   < > 63* 57* 57* 49* 43*  CREATININE 1.57*   < > 1.75* 1.57* 1.45* 1.37* 1.27*  CALCIUM 8.5*   < > 8.7* 8.7* 8.9 8.3* 8.7*  PROT 8.1  --  7.9 7.9  --   --   --   ALBUMIN 2.2*  --  2.1* 2.0*  --   --   --   AST 26  --  21 24  --   --   --   ALT 25  --  22 24  --   --   --   ALKPHOS 70  --  65 83  --   --   --   BILITOT 1.4*  --  1.4* 0.7  --   --   --   GFRNONAA 51*   < > 45* 51* 56* 60* >60  ANIONGAP 9   < > 12 10 7 7 7    < > = values in this interval not displayed.     Hematology Recent Labs  Lab 01/21/21 0332 01/22/21 0334 01/23/21 0622  WBC 30.7* 16.9* 19.5*  RBC 5.21 4.94 5.22  HGB 14.9 14.2 14.8  HCT 45.4 43.7 45.7  MCV 87.1 88.5 87.5  MCH 28.6 28.7 28.4  MCHC 32.8 32.5 32.4  RDW 18.7* 18.8* 18.6*  PLT 313 336 366    BNP No results for input(s): BNP, PROBNP in the last 168 hours.    DDimer No results for input(s): DDIMER in the last 168 hours.   Radiology    DG CHEST PORT 1 VIEW  Result Date: 01/22/2021 CLINICAL DATA:  Shortness of breath. EXAM: PORTABLE CHEST 1 VIEW COMPARISON:  January 15, 2021 FINDINGS: Stable cardiomegaly with stable AICD device. No pneumothorax. A new right PICC line terminates in the central SVC. No pulmonary nodules or masses. No focal infiltrates. No acute abnormalities. IMPRESSION: Support apparatus as above.  Cardiomegaly.  No acute abnormalities. Electronically Signed   By:  Gerome Sam III M.D   On: 01/22/2021 16:54    Cardiac Studies   Echo 01/16/21:  1. The anterior,anteroseptal,anterolateral walls are  akinetic. The apex  is akinetic. Marland Kitchen Left ventricular ejection fraction, by estimation, is 20 to  25%. The left ventricle has severely decreased function. The left  ventricle demonstrates regional wall  motion abnormalities (see scoring diagram/findings for description). The  left ventricular internal cavity size was moderately to severely dilated.  Left ventricular diastolic parameters are indeterminate.   2. RV poorly visualized. Grossly appears mildly enlarged with low normal  to mildly decreased function. Right ventricular systolic function was not  well visualized. The right ventricular size is not well visualized.  Tricuspid regurgitation signal is  inadequate for assessing PA pressure.   3. Left atrial size was mildly dilated.   4. The mitral valve is normal in structure. No evidence of mitral valve  regurgitation. No evidence of mitral stenosis.   5. The aortic valve was not well visualized. Aortic valve regurgitation  is not visualized. No aortic stenosis is present.   Patient Profile     60 y.o. male with chronic systolic and diastolic heart failure, CAD, COPD, OSA not on CPAP, polycythemia, hypertension and hyperlipidemia admitted with acute on chronic hypoxic respiratory failure due to exacerbation of COPD and acute on chronic heart failure.  Hospitalization has been complicated by sepsis/bacteremia and right lower extremity wound now status post left BKA.  Assessment & Plan    #Biventricular heart failure: #Essential hypertension: #GERD hypercholesterolemia: LVEF this admission 15 to 20%.  He also has moderate right RV dysfunction.  Diuresis was held for acute on chronic renal failure.  Right atrial pressure was 5 mmHg.  Torsemide was resumed 7/30.  He was net -1.2 L yesterday and renal function is stable.  Continue current torsemide dosing.  His bisoprolol was also resumed on 7/30.  Blood pressure and heart rate are stable.  He is status post ICD.  There was some concern for  amyloid.  PYP scan was negative for ATTR amyloid in 2021.  Continue hydralazine, Imdur, spironolactone, and torsemide.  At home he is also on [place].  Plan to resume prior to discharge.  # CAD:  Stable.  Continue aspirin, atorvastatin, and bisoprolol as above.      For questions or updates, please contact [facility] Please consult [email or web address] for contact info under        Signed, Chilton Si, MD  01/23/2021, 9:02 AM

## 2021-01-24 ENCOUNTER — Inpatient Hospital Stay (HOSPITAL_COMMUNITY): Payer: Medicare Other

## 2021-01-24 DIAGNOSIS — L0390 Cellulitis, unspecified: Secondary | ICD-10-CM

## 2021-01-24 DIAGNOSIS — J9622 Acute and chronic respiratory failure with hypercapnia: Secondary | ICD-10-CM | POA: Diagnosis not present

## 2021-01-24 DIAGNOSIS — J9621 Acute and chronic respiratory failure with hypoxia: Secondary | ICD-10-CM | POA: Diagnosis not present

## 2021-01-24 DIAGNOSIS — I5022 Chronic systolic (congestive) heart failure: Secondary | ICD-10-CM | POA: Diagnosis not present

## 2021-01-24 LAB — CBC WITH DIFFERENTIAL/PLATELET
Abs Immature Granulocytes: 0.29 10*3/uL — ABNORMAL HIGH (ref 0.00–0.07)
Basophils Absolute: 0.1 10*3/uL (ref 0.0–0.1)
Basophils Relative: 0 %
Eosinophils Absolute: 0.1 10*3/uL (ref 0.0–0.5)
Eosinophils Relative: 0 %
HCT: 44.7 % (ref 39.0–52.0)
Hemoglobin: 14.3 g/dL (ref 13.0–17.0)
Immature Granulocytes: 2 %
Lymphocytes Relative: 9 %
Lymphs Abs: 1.6 10*3/uL (ref 0.7–4.0)
MCH: 28.5 pg (ref 26.0–34.0)
MCHC: 32 g/dL (ref 30.0–36.0)
MCV: 89 fL (ref 80.0–100.0)
Monocytes Absolute: 1.9 10*3/uL — ABNORMAL HIGH (ref 0.1–1.0)
Monocytes Relative: 10 %
Neutro Abs: 14.1 10*3/uL — ABNORMAL HIGH (ref 1.7–7.7)
Neutrophils Relative %: 79 %
Platelets: 362 10*3/uL (ref 150–400)
RBC: 5.02 MIL/uL (ref 4.22–5.81)
RDW: 18.5 % — ABNORMAL HIGH (ref 11.5–15.5)
WBC: 18 10*3/uL — ABNORMAL HIGH (ref 4.0–10.5)
nRBC: 0 % (ref 0.0–0.2)

## 2021-01-24 LAB — GLUCOSE, CAPILLARY
Glucose-Capillary: 83 mg/dL (ref 70–99)
Glucose-Capillary: 77 mg/dL (ref 70–99)
Glucose-Capillary: 231 mg/dL — ABNORMAL HIGH (ref 70–99)
Glucose-Capillary: 151 mg/dL — ABNORMAL HIGH (ref 70–99)
Glucose-Capillary: 165 mg/dL — ABNORMAL HIGH (ref 70–99)
Glucose-Capillary: 110 mg/dL — ABNORMAL HIGH (ref 70–99)

## 2021-01-24 LAB — COOXEMETRY PANEL
Carboxyhemoglobin: 1.1 % (ref 0.5–1.5)
Methemoglobin: 1 % (ref 0.0–1.5)
O2 Saturation: 63.7 %
Total hemoglobin: 14.8 g/dL (ref 12.0–16.0)

## 2021-01-24 LAB — BASIC METABOLIC PANEL
Anion gap: 7 (ref 5–15)
BUN: 39 mg/dL — ABNORMAL HIGH (ref 6–20)
CO2: 36 mmol/L — ABNORMAL HIGH (ref 22–32)
Calcium: 8.4 mg/dL — ABNORMAL LOW (ref 8.9–10.3)
Chloride: 92 mmol/L — ABNORMAL LOW (ref 98–111)
Creatinine, Ser: 1.21 mg/dL (ref 0.61–1.24)
GFR, Estimated: >60 mL/min (ref >60)
Glucose, Bld: 90 mg/dL (ref 70–99)
Potassium: 4.5 mmol/L (ref 3.5–5.1)
Sodium: 135 mmol/L (ref 135–145)

## 2021-01-24 MED ORDER — IPRATROPIUM-ALBUTEROL 0.5-2.5 (3) MG/3ML IN SOLN: 3.0000 mL | Freq: Two times a day (BID) | RESPIRATORY_TRACT | Status: DC | PRN

## 2021-01-24 MED ORDER — SACUBITRIL-VALSARTAN 24-26 MG PO TABS
1.0000 | ORAL_TABLET | Freq: Two times a day (BID) | ORAL | Status: DC
  Administered 2021-01-24 – 2021-01-27 (×6): 1 via ORAL
  Filled 2021-01-24 (×6): qty 1

## 2021-01-24 MED ORDER — METOCLOPRAMIDE HCL 5 MG/ML IJ SOLN
5.0000 mg | Freq: Three times a day (TID) | INTRAMUSCULAR | Status: DC
  Administered 2021-01-24 – 2021-01-25 (×3): 5 mg via INTRAVENOUS
  Filled 2021-01-24 (×3): qty 2

## 2021-01-24 MED ORDER — TORSEMIDE 20 MG PO TABS
40.0000 mg | ORAL_TABLET | Freq: Every day | ORAL | Status: DC
  Administered 2021-01-24 – 2021-01-27 (×4): 40 mg via ORAL
  Filled 2021-01-24 (×4): qty 2

## 2021-01-24 NOTE — Progress Notes (Signed)
Inpatient Rehab Admissions Coordinator:   Per MD, Pt. Is not medically ready for CIR today. I will follow for potential admit tomorrow.   Megan Salon, MS, CCC-SLP Rehab Admissions Coordinator  [phone] (celll) [phone] (office)

## 2021-01-24 NOTE — Progress Notes (Signed)
PROGRESS NOTE    Richard [name]  QBH:[id number] DOB: 1960/07/03 DOA: 01/15/2021 PCP: Ananias Pilgrim, MD   Brief Narrative: 60 year old with past medical history significant for diabetes type 2, COPD on chronic oxygen 2 L, systolic heart failure status post AICD, venous stasis ulcer, hypertension, CKD, pulmonary nodule [name] patient with increased shortness of breath, no fever no chest pain.  Noted to have oxygen saturation 87 on room air with EMS.  4 L [place] applied by EMS and gave 1 nebulizer, Solu-Medrol.  CCM was consulted for worsening hypoxia and patient being placed on BiPAP.  Patient was admitted for further management.    Assessment & Plan:   Principal Problem:   Acute on chronic respiratory failure with hypoxia and hypercapnia (HCC) Active Problems:   Hypertension   CKD (chronic kidney disease)   Presence of implantable cardioverter-defibrillator (ICD)   GERD (gastroesophageal reflux disease)   Diabetes mellitus with diabetic neuropathy, with long-term current use of insulin (HCC)   COPD exacerbation (HCC)   Chronic systolic CHF (congestive heart failure) (HCC)   Acute renal failure superimposed on stage 3 chronic kidney disease (HCC)   Elevated troponin   Hyponatremia   Bacteremia   Chronic ulcer of left ankle (HCC)   Severe sepsis without septic shock (HCC)   Open wound   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HCC)   SOB (shortness of breath)   S/P BKA (below knee amputation), left (HCC)  1-Acute on Chronic Hypoxic Respiratory Failure, acute on chronic combined heart failure, AICD -Patient required BiPAP on admission.  Currently on nasal cannula oxygen for mentation -Elevation of BNP and flat troponin.  Chest x-ray with mild vascular congestion. -Echo showed ejection fraction 20 to 25% -Cardiology  consulted appreciate recommendation. -Continue with hydralazine, Imdur, torsemide and  spironolactone.  -Bisoprolol, digoxin and Entresto on hold. -Continue with  Torsemide.  Stable.   Severe sepsis likely secondary to group G bacteremia Likely secondary to chronic left heel unstageable ulcer.  -Patient tachycardic, tachypneic L a more than 2 leukocytosis -Cultures x2 gross strep group G, Staphylococcus  -Repeated blood culture 7/24: No growth to date -ID Following and recommend TEE. TEE negative -ABI requested result, but no results was received -Dr Lajoyce Corners consulted. Recommended BKA.  -CT:  Plantar soft tissue wound with adjacent swelling and soft tissue gas concerning for necrotizing infection, with possible early changes of osteomyelitis involving the plantar aspect of the calcaneus near the plantar fascia insertion. No visible abscess within the field of view. Further evaluation with MRI could be considered to determine extent of involvement. Nondisplaced anterior calcaneal process fracture along the lateral aspect. -Continue with IV penicillin G. Needs 7 days from 7/29---8/05 if discharge prior to 8/05 we can discharge him on Cefadroxil 1 gr BID.  -If Leukocytosis  fail to improved, might need to look for other sources.  -SP BKA 7/29 Plan to get CT abdomen pelvis.   Possible COPD exacerbation Azithromycin, Completed prednisone 7/28. Continue with  nebulizer treatments  Diabetes type 2: Continue with Lantus and sliding scale insulin A1c 10  AKI on CKD stage III: Baseline creatinine 1.5  CAD/HLD Status post PCI Continue with aspirin and statin.  Chronic venous stasis ulcer: Status postdebridement of left foot wound on 01/13/2021 at OSH Wound care following.  Hiccup:  Started baclofen, on PPI to twice daily Started on Gabapentin.  Some improvement of Hiccups.  Plan to check CT abdomen pelvis.  Start Reglan.   Depression;  Resume Cymbalta and buspar.  Vascular. ABI order to evaluate right LE>   Nutrition Problem: Increased nutrient needs Etiology: wound healing    Signs/Symptoms: estimated needs    Interventions: Glucerna  shake, MVI, Premier Protein  Estimated body mass index is 29.8 kg/m as calculated from the following:   Height as of this encounter: 6\' 5"  (1.956 m).   Weight as of this encounter: 114 kg.   DVT prophylaxis: Heparin Code Status: Full code Family Communication: Care discussed with patient, wife over phone Disposition Plan:  Status is: Inpatient  Remains inpatient appropriate because:IV treatments appropriate due to intensity of illness or inability to take PO  Dispo: The patient is from: Home              Anticipated d/c is to: Home              Patient currently is not medically stable to d/c.   Difficult to place patient No        Consultants:  Cardiology ID  Procedures:    Antimicrobials:    Subjective: He is uncomfortable today. Hiccup, are not better.  Objective: Vitals:   01/24/21 0413 01/24/21 0814 01/24/21 0831 01/24/21 1127  BP: 108/84 102/82  99/71  Pulse: 66 61  65  Resp: 15 (!) 23  16  Temp: 97.9 F (36.6 C) 98.3 F (36.8 C)  98 F (36.7 C)  TempSrc: Oral Oral  Oral  SpO2: 100% 100% 98% 96%  Weight: 114 kg     Height:        Intake/Output Summary (Last 24 hours) at 01/24/2021 1518 Last data filed at 01/24/2021 1001 Gross per 24 hour  Intake 770.45 ml  Output 4475 ml  Net -3704.55 ml    Filed Weights   01/21/21 0428 01/22/21 0400 01/24/21 0413  Weight: 115.3 kg 121.6 kg 114 kg    Examination:  General exam: NAD Respiratory system: CTA Cardiovascular system: S 1, S 2 RRR Gastrointestinal system: BS present, soft, nt Central nervous system: Alert Extremities: S/P BKA wound vac and limb protector.   Data Reviewed: I have personally reviewed following labs and imaging studies  CBC: Recent Labs  Lab 01/20/21 0357 01/21/21 0332 01/22/21 0334 01/23/21 0622 01/24/21 0539  WBC 34.1* 30.7* 16.9* 19.5* 18.0*  NEUTROABS 29.9* 26.4* 12.8* 15.8* 14.1*  HGB 16.1 14.9 14.2 14.8 14.3  HCT 49.1 45.4 43.7 45.7 44.7  MCV 87.7 87.1 88.5 87.5  89.0  PLT 255 313 336 366 362    Basic Metabolic Panel: Recent Labs  Lab 01/21/21 0332 01/21/21 0446 01/22/21 0334 01/23/21 0622 01/24/21 0539  NA 127* 131* 131* 132* 135  K 7.5* 4.7 4.7 4.8 4.5  CL 87* 92* 94* 92* 92*  CO2 30 32 30 33* 36*  GLUCOSE 260* 160* 186* 119* 90  BUN 57* 57* 49* 43* 39*  CREATININE 1.57* 1.45* 1.37* 1.27* 1.21  CALCIUM 8.7* 8.9 8.3* 8.7* 8.4*    GFR: Estimated Creatinine Clearance: 93.3 mL/min (by C-G formula based on SCr of 1.21 mg/dL). Liver Function Tests: Recent Labs  Lab 01/18/21 0325 01/19/21 0500 01/20/21 0357 01/21/21 0332  AST 18 26 21 24   ALT 19 25 22 24   ALKPHOS 63 70 65 83  BILITOT 0.9 1.4* 1.4* 0.7  PROT 7.9 8.1 7.9 7.9  ALBUMIN 2.2* 2.2* 2.1* 2.0*    No results for input(s): LIPASE, AMYLASE in the last 168 hours. No results for input(s): AMMONIA in the last 168 hours. Coagulation Profile: No results for input(s): INR, PROTIME  in the last 168 hours. Cardiac Enzymes: No results for input(s): CKTOTAL, CKMB, CKMBINDEX, TROPONINI in the last 168 hours.  BNP (last 3 results) No results for input(s): PROBNP in the last 8760 hours. HbA1C: No results for input(s): HGBA1C in the last 72 hours.  CBG: Recent Labs  Lab 01/23/21 2124 01/24/21 0612 01/24/21 0814 01/24/21 0939 01/24/21 1307  GLUCAP 206* 83 77 231* 151*    Lipid Profile: No results for input(s): CHOL, HDL, LDLCALC, TRIG, CHOLHDL, LDLDIRECT in the last 72 hours. Thyroid Function Tests: No results for input(s): TSH, T4TOTAL, FREET4, T3FREE, THYROIDAB in the last 72 hours. Anemia Panel: No results for input(s): VITAMINB12, FOLATE, FERRITIN, TIBC, IRON, RETICCTPCT in the last 72 hours. Sepsis Labs: Recent Labs  Lab 01/18/21 0325  PROCALCITON 4.37     Recent Results (from the past 240 hour(s))  Culture, blood (routine x 2)     Status: None   Collection Time: 01/15/21  3:41 PM   Specimen: BLOOD RIGHT HAND  Result Value Ref Range Status   Specimen  Description BLOOD RIGHT HAND  Final   Special Requests   Final    BOTTLES DRAWN AEROBIC AND ANAEROBIC Blood Culture adequate volume   Culture   Final    NO GROWTH 5 DAYS Performed at [facility], [address], [place], [place] [address]    Report Status 01/20/2021 FINAL  Final  Resp Panel by RT-PCR (Flu A&B, Covid) Nasopharyngeal Swab     Status: None   Collection Time: 01/15/21  3:45 PM   Specimen: Nasopharyngeal Swab; Nasopharyngeal(NP) swabs in vial transport medium  Result Value Ref Range Status   SARS Coronavirus 2 by RT PCR NEGATIVE NEGATIVE Final    Comment: (NOTE) SARS-CoV-2 target nucleic acids are NOT DETECTED.  The SARS-CoV-2 RNA is generally detectable in upper respiratory specimens during the acute phase of infection. The lowest concentration of SARS-CoV-2 viral copies this assay can detect is 138 copies/mL. A negative result does not preclude SARS-Cov-2 infection and should not be used as the sole basis for treatment or other patient management decisions. A negative result may occur with  improper specimen collection/handling, submission of specimen other than nasopharyngeal swab, presence of viral mutation(s) within the areas targeted by this assay, and inadequate number of viral copies(<138 copies/mL). A negative result must be combined with clinical observations, patient history, and epidemiological information. The expected result is Negative.  Fact Sheet for Patients:  BloggerCourse.com  Fact Sheet for Healthcare Providers:  SeriousBroker.it  This test is no t yet approved or cleared by the [place] FDA and  has been authorized for detection and/or diagnosis of SARS-CoV-2 by FDA under an Emergency Use Authorization (EUA). This EUA will remain  in effect (meaning this test can be used) for the duration of the COVID-19 declaration under Section 564(b)(1) of the Act, 21 U.S.C.section 360bbb-3(b)(1),  unless the authorization is terminated  or revoked sooner.       Influenza A by PCR NEGATIVE NEGATIVE Final   Influenza B by PCR NEGATIVE NEGATIVE Final    Comment: (NOTE) The Xpert Xpress SARS-CoV-2/FLU/RSV plus assay is intended as an aid in the diagnosis of influenza from Nasopharyngeal swab specimens and should not be used as a sole basis for treatment. Nasal washings and aspirates are unacceptable for Xpert Xpress SARS-CoV-2/FLU/RSV testing.  Fact Sheet for Patients: BloggerCourse.com  Fact Sheet for Healthcare Providers: SeriousBroker.it  This test is not yet approved or cleared by the [place] FDA and has  been authorized for detection and/or diagnosis of SARS-CoV-2 by FDA under an Emergency Use Authorization (EUA). This EUA will remain in effect (meaning this test can be used) for the duration of the COVID-19 declaration under Section 564(b)(1) of the Act, 21 U.S.C. section 360bbb-3(b)(1), unless the authorization is terminated or revoked.  Performed at [facility], [address] [place], [place] [address]   Culture, blood (routine x 2)     Status: Abnormal   Collection Time: 01/15/21  6:05 PM   Specimen: BLOOD LEFT FOREARM  Result Value Ref Range Status   Specimen Description BLOOD LEFT FOREARM  Final   Special Requests   Final    BOTTLES DRAWN AEROBIC AND ANAEROBIC Blood Culture adequate volume   Culture  Setup Time   Final    GRAM POSITIVE COCCI IN CHAINS AEROBIC BOTTLE ONLY CRITICAL RESULT CALLED TO, READ BACK BY AND VERIFIED WITH: PHARM D K.HAMMONS ON [id number] AT 0935 BY E.PARRISH    Culture (A)  Final    STREPTOCOCCUS GROUP G STAPHYLOCOCCUS HOMINIS CRITICAL RESULT CALLED TO, READ BACK BY AND VERIFIED WITH: PHARMD JEREMY F. [id number] FCP THE SIGNIFICANCE OF ISOLATING THIS ORGANISM FROM A SINGLE VENIPUNCTURE CANNOT BE PREDICTED WITHOUT FURTHER CLINICAL AND CULTURE CORRELATION. SUSCEPTIBILITIES  AVAILABLE ONLY ON REQUEST. Performed at [facility], [address], [place], [place] [address]    Report Status 01/19/2021 FINAL  Final   Organism ID, Bacteria STREPTOCOCCUS GROUP G  Final      Susceptibility   Streptococcus group g - MIC*    CLINDAMYCIN RESISTANT Resistant     AMPICILLIN <=0.25 SENSITIVE Sensitive     ERYTHROMYCIN 4 RESISTANT Resistant     VANCOMYCIN 0.5 SENSITIVE Sensitive     CEFTRIAXONE <=0.12 SENSITIVE Sensitive     LEVOFLOXACIN 1 SENSITIVE Sensitive     PENICILLIN Value in next row Sensitive      SENSITIVEMIC <=0.06    * STREPTOCOCCUS GROUP G  Blood Culture ID Panel (Reflexed)     Status: Abnormal   Collection Time: 01/15/21  6:05 PM  Result Value Ref Range Status   Enterococcus faecalis NOT DETECTED NOT DETECTED Final   Enterococcus Faecium NOT DETECTED NOT DETECTED Final   Listeria monocytogenes NOT DETECTED NOT DETECTED Final   Staphylococcus species DETECTED (A) NOT DETECTED Final    Comment: CRITICAL RESULT CALLED TO, READ BACK BY AND VERIFIED WITH: PHARM D K.HAMMONS ON [id number] AT 0935 BY E.PARRISH    Staphylococcus aureus (BCID) NOT DETECTED NOT DETECTED Final   Staphylococcus epidermidis NOT DETECTED NOT DETECTED Final   Staphylococcus lugdunensis NOT DETECTED NOT DETECTED Final   Streptococcus species DETECTED (A) NOT DETECTED Final    Comment: Not Enterococcus species, Streptococcus agalactiae, Streptococcus pyogenes, or Streptococcus pneumoniae. CRITICAL RESULT CALLED TO, READ BACK BY AND VERIFIED WITH: PHARM D K.HAMMONS ON [id number] AT 0935 BY E.PARRISH    Streptococcus agalactiae NOT DETECTED NOT DETECTED Final   Streptococcus pneumoniae NOT DETECTED NOT DETECTED Final   Streptococcus pyogenes NOT DETECTED NOT DETECTED Final   A.calcoaceticus-baumannii NOT DETECTED NOT DETECTED Final   Bacteroides fragilis NOT DETECTED NOT DETECTED Final   Enterobacterales NOT DETECTED NOT DETECTED Final   Enterobacter cloacae complex NOT DETECTED NOT  DETECTED Final   Escherichia coli NOT DETECTED NOT DETECTED Final   Klebsiella aerogenes NOT DETECTED NOT DETECTED Final   Klebsiella oxytoca NOT DETECTED NOT DETECTED Final   Klebsiella pneumoniae NOT DETECTED NOT DETECTED Final   Proteus species NOT DETECTED NOT  DETECTED Final   Salmonella species NOT DETECTED NOT DETECTED Final   Serratia marcescens NOT DETECTED NOT DETECTED Final   Haemophilus influenzae NOT DETECTED NOT DETECTED Final   Neisseria meningitidis NOT DETECTED NOT DETECTED Final   Pseudomonas aeruginosa NOT DETECTED NOT DETECTED Final   Stenotrophomonas maltophilia NOT DETECTED NOT DETECTED Final   Candida albicans NOT DETECTED NOT DETECTED Final   Candida auris NOT DETECTED NOT DETECTED Final   Candida glabrata NOT DETECTED NOT DETECTED Final   Candida krusei NOT DETECTED NOT DETECTED Final   Candida parapsilosis NOT DETECTED NOT DETECTED Final   Candida tropicalis NOT DETECTED NOT DETECTED Final   Cryptococcus neoformans/gattii NOT DETECTED NOT DETECTED Final    Comment: Performed at [facility], [address], [place], [place] [address]  MRSA Next Gen by PCR, Nasal     Status: Abnormal   Collection Time: 01/15/21 10:38 PM   Specimen: Nasal Mucosa; Nasal Swab  Result Value Ref Range Status   MRSA by PCR Next Gen DETECTED (A) NOT DETECTED Final    Comment: RESULT CALLED TO, READ BACK BY AND VERIFIED WITH: RN M.SHULL ON [id number] AT 1121 BY E.PARRISH (NOTE) The GeneXpert MRSA Assay (FDA approved for NASAL specimens only), is one component of a comprehensive MRSA colonization surveillance program. It is not intended to diagnose MRSA infection nor to guide or monitor treatment for MRSA infections. Test performance is not FDA approved in patients less than 68 years old. Performed at [facility], [address], [place], [place] [address]   Culture, blood (routine x 2)     Status: None   Collection Time: 01/16/21 11:38 AM   Specimen: BLOOD  Result  Value Ref Range Status   Specimen Description BLOOD RIGHT ANTECUBITAL  Final   Special Requests   Final    BOTTLES DRAWN AEROBIC ONLY Blood Culture adequate volume   Culture   Final    NO GROWTH 6 DAYS Performed at [facility], [address], [place], [place] [address]    Report Status 01/22/2021 FINAL  Final  Culture, blood (routine x 2)     Status: None   Collection Time: 01/16/21 11:39 AM   Specimen: BLOOD RIGHT HAND  Result Value Ref Range Status   Specimen Description BLOOD RIGHT HAND  Final   Special Requests   Final    BOTTLES DRAWN AEROBIC ONLY Blood Culture results may not be optimal due to an inadequate volume of blood received in culture bottles   Culture   Final    NO GROWTH 6 DAYS Performed at [facility], [address] [place], [place] [address]    Report Status 01/22/2021 FINAL  Final  Surgical pcr screen     Status: Abnormal   Collection Time: 01/21/21 12:34 AM   Specimen: Nasal Mucosa; Nasal Swab  Result Value Ref Range Status   MRSA, PCR NEGATIVE NEGATIVE Final   Staphylococcus aureus POSITIVE (A) NEGATIVE Final    Comment: (NOTE) The Xpert SA Assay (FDA approved for NASAL specimens in patients 83 years of age and older), is one component of a comprehensive surveillance program. It is not intended to diagnose infection nor to guide or monitor treatment. Performed at [facility], [address], [place], [place] [address]           Radiology Studies: VAS [place] ABI WITH/WO TBI  Result Date: 01/24/2021  LOWER EXTREMITY DOPPLER STUDY Patient Name:  Richard [name]  Date  of Exam:   01/24/2021 Medical Rec #: [id number]     Accession #:    [id number] Date of Birth: [birth year]    Patient Gender: M Patient Age:   68Y Exam Location:  [facility] Procedure:      VAS [place] ABI WITH/WO TBI Referring Phys: [id number] MARCUS V DUDA --------------------------------------------------------------------------------  Indications: Ulceration. High Risk Factors:  Diabetes, coronary artery disease. Other Factors: Left BKA.  Comparison Study: No prior study on file Performing Technologist: Sherren Kerns RVS  Examination Guidelines: A complete evaluation includes at minimum, Doppler waveform signals and systolic blood pressure reading at the level of bilateral brachial, anterior tibial, and posterior tibial arteries, when vessel segments are accessible. Bilateral testing is considered an integral part of a complete examination. Photoelectric Plethysmograph (PPG) waveforms and toe systolic pressure readings are included as required and additional duplex testing as needed. Limited examinations for reoccurring indications may be performed as noted.  ABI Findings: +---------+------------------+-----+----------+----------+ Right    Rt Pressure (mmHg)IndexWaveform  Comment    +---------+------------------+-----+----------+----------+ Brachial                                  restricted +---------+------------------+-----+----------+----------+ PTA      107               1.06 monophasic           +---------+------------------+-----+----------+----------+ DP       88                0.87 monophasic           +---------+------------------+-----+----------+----------+ Great Toe41                0.41                      +---------+------------------+-----+----------+----------+ +---------+------------------+-----+---------+-------+ Left     Lt Pressure (mmHg)IndexWaveform Comment +---------+------------------+-----+---------+-------+ Brachial 101                    triphasic        +---------+------------------+-----+---------+-------+ PTA                                      BKA     +---------+------------------+-----+---------+-------+ DP                                       BKA     +---------+------------------+-----+---------+-------+ Great Toe                                BKA      +---------+------------------+-----+---------+-------+ +-------+-----------+-----------+------------+------------+ ABI/TBIToday's ABIToday's TBIPrevious ABIPrevious TBI +-------+-----------+-----------+------------+------------+ Right  1.06       0.41                                +-------+-----------+-----------+------------+------------+ Left   BKA                                            +-------+-----------+-----------+------------+------------+  Summary: Right: Resting right ankle-brachial index is within normal  range. No evidence of significant right lower extremity arterial disease. The right toe-brachial index is abnormal. ABIs are unreliable. Left: BKA.  *See table(s) above for measurements and observations.     Preliminary         Scheduled Meds:  allopurinol  100 mg Oral Daily   vitamin C  1,000 mg Oral Daily   aspirin EC  81 mg Oral Daily   atorvastatin  40 mg Oral Daily   bisoprolol  2.5 mg Oral Daily   busPIRone  5 mg Oral BID   Chlorhexidine Gluconate Cloth  6 each Topical Daily   docusate sodium  100 mg Oral BID   DULoxetine  60 mg Oral Daily   empagliflozin  10 mg Oral Daily   feeding supplement (GLUCERNA SHAKE)  237 mL Oral BID BM   gabapentin  100 mg Oral TID   heparin  5,000 Units Subcutaneous Q8H   hydrALAZINE  10 mg Oral Q8H   insulin aspart  0-15 Units Subcutaneous TID WC   insulin aspart  0-5 Units Subcutaneous QHS   insulin aspart  4 Units Subcutaneous TID WC   insulin glargine  10 Units Subcutaneous BID   isosorbide mononitrate  30 mg Oral Daily   metoCLOPramide (REGLAN) injection  5 mg Intravenous Q8H   multivitamin with minerals  1 tablet Oral Daily   nutrition supplement (JUVEN)  1 packet Oral BID BM   pantoprazole  40 mg Oral BID   povidone-iodine  2 application Topical Once   Ensure Max Protein  11 oz Oral QHS   sacubitril-valsartan  1 tablet Oral BID   senna  1 tablet Oral BID   sodium chloride flush  10-40 mL Intracatheter Q12H    spironolactone  12.5 mg Oral Daily   torsemide  40 mg Oral Daily   zinc sulfate  220 mg Oral Daily   Continuous Infusions:  lactated ringers 10 mL/hr at 01/21/21 0946   magnesium sulfate bolus IVPB     penicillin g continuous IV infusion 12 Million Units (01/24/21 1314)     LOS: 9 days    Time spent: 35 minutes.     Alba CoryBelkys A Letasha Kershaw, MD [facility]   If 7PM-7AM, please contact night-coverage [email or web address]  01/24/2021, 3:18 PM

## 2021-01-24 NOTE — Progress Notes (Signed)
Occupational Therapy Treatment Patient Details Name: Richard [name] MRN: [id number] DOB: September 19, 1960 Today's Date: 01/24/2021    History of present illness Pt is a 60 y.o. male admitted 01/15/21 with SOB, body aches. Workup for CHF vs COPD exacerbation. 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 PMH includes DM2, COPD (on 2L O2 baseline), CHF, AICD, CKD, HTN.   OT comments  Pt progressing gradually towards OT goals. With encouragement, pt participatory in EOB activities. Session focused on UE HEP education to maximize UB strength for transfers, compensatory strategies for LB ADLs, and trial of built up grips on ADL items to maximize independence due to B hand numbness. Provided HEP handout for improved carryover. Continue to anticipate good rehab potential with intensive CIR therapies.    Follow Up Recommendations  CIR    Equipment Recommendations  3 in 1 bedside commode;Wheelchair (measurements OT);Wheelchair cushion (measurements OT);Tub/shower bench    Recommendations for Other Services Rehab consult    Precautions / Restrictions Precautions Precautions: Fall;Other (comment) Precaution Comments: monitor vitals Required Braces or Orthoses: Other Brace Other Brace: shrinker and limb protector Restrictions Weight Bearing Restrictions: Yes LLE Weight Bearing: Non weight bearing       Mobility Bed Mobility Overal bed mobility: Needs Assistance Bed Mobility: Supine to Sit;Sit to Supine     Supine to sit: Min assist Sit to supine: Supervision   General bed mobility comments: Min A via handheld assist to sit EOB, no assist to get back in bed    Transfers                      Balance Overall balance assessment: Needs assistance Sitting-balance support: No upper extremity supported;Feet supported Sitting balance-Leahy Scale: Good                                      ADL either performed or assessed with clinical judgement   ADL Overall ADL's : Needs assistance/impaired       Grooming Details (indicate cue type and reason): provided built up grip for toothbrush use due to B hand numbness. pt able to demo simulation but declined to actually attempt task                               General ADL Comments: significant decline in ability to complete ADL tasks s/p BKA. Educated on strategies for LB ADLs via lateral leans or in bed to maximize independence and decrease fall risk (while also progressing standing balance). Educated on built up grip use for utensils during meals with pt reporting "this will work" but declined to actually attempt task     Vision   Vision Assessment?: No apparent visual deficits   Perception     Praxis      Cognition Arousal/Alertness: Awake/alert Behavior During Therapy: Flat affect Overall Cognitive Status: Impaired/Different from baseline Area of Impairment: Attention;Following commands;Safety/judgement;Awareness                   Current Attention Level: Selective   Following Commands: Follows one step commands with increased time Safety/Judgement: Decreased awareness of safety Awareness: Emergent Problem Solving: Slow processing;Difficulty sequencing;Requires verbal cues;Requires tactile cues General Comments: pt easily distracted, some decreased awareness of deficits, difficulty problem soving at times  Exercises Exercises: General Upper Extremity General Exercises - Upper Extremity Shoulder Flexion: Strengthening;Both;10 reps;Seated;Theraband Theraband Level (Shoulder Flexion): Level 3 (Green) Shoulder Horizontal ABduction: Strengthening;Both;10 reps;Seated;Theraband Theraband Level (Shoulder Horizontal Abduction): Level 3 (Green) Elbow Flexion: Strengthening;Both;10 reps;Seated;Theraband Theraband Level (Elbow Flexion): Level 3 (Green) Elbow Extension:  Strengthening;Both;10 reps;Seated;Theraband Theraband Level (Elbow Extension): Level 3 (Green)   Shoulder Instructions       General Comments VSS on RA (has cannula in nose but not turned on the wall on entry). Reinforced proper positioning of residual limb in bed    Pertinent Vitals/ Pain       Pain Assessment: Faces Faces Pain Scale: Hurts little more Pain Location: L residual limb Pain Descriptors / Indicators: Grimacing;Sore Pain Intervention(s): Monitored during session;Premedicated before session  Home Living                                          Prior Functioning/Environment              Frequency  Min 2X/week        Progress Toward Goals  OT Goals(current goals can now be found in the care plan section)  Progress towards OT goals: Progressing toward goals  Acute Rehab OT Goals Patient Stated Goal: to get better OT Goal Formulation: With patient Time For Goal Achievement: 02/05/21 Potential to Achieve Goals: Good ADL Goals Pt Will Perform Grooming: with modified independence;standing Pt Will Perform Lower Body Dressing: with modified independence;sitting/lateral leans;sit to/from stand Pt Will Transfer to Toilet: with modified independence;squat pivot transfer;bedside commode Pt Will Perform Toileting - Clothing Manipulation and hygiene: with set-up;sitting/lateral leans Additional ADL Goal #1: Pt will demonstrate 2 desensitization techniques to reduce phantom pain sensations  Plan Discharge plan remains appropriate    Co-evaluation                 AM-PAC OT "6 Clicks" Daily Activity     Outcome Measure   Help from another person eating meals?: A Little Help from another person taking care of personal grooming?: A Little Help from another person toileting, which includes using toliet, bedpan, or urinal?: A Lot Help from another person bathing (including washing, rinsing, drying)?: A Lot Help from another person to put on  and taking off regular upper body clothing?: A Little Help from another person to put on and taking off regular lower body clothing?: A Lot 6 Click Score: 15    End of Session    OT Visit Diagnosis: Unsteadiness on feet (R26.81);Other abnormalities of gait and mobility (R26.89);Muscle weakness (generalized) (M62.81);Pain Pain - Right/Left: Left Pain - part of body: Leg   Activity Tolerance Patient tolerated treatment well   Patient Left in bed;with call bell/phone within reach;with bed alarm set   Nurse Communication Mobility status;Other (comment) (participation)        Time: 6720-9470 OT Time Calculation (min): 25 min  Charges: OT General Charges $OT Visit: 1 Visit OT Treatments $Self Care/Home Management : 8-22 mins $Therapeutic Exercise: 8-22 mins  Bradd Canary, OTR/L Acute Rehab Services Office: [phone]    Lorre Munroe 01/24/2021, 11:34 AM

## 2021-01-24 NOTE — Progress Notes (Signed)
PT Cancellation Note  Patient Details Name: Richard [name] MRN: [id number] DOB: May 23, 1961   Cancelled Treatment:    Reason Eval/Treat Not Completed: Patient declined, no reason specified. Patient declines. Will re-attempt tomorrow.    Estefano Victory 01/24/2021, 1:52 PM

## 2021-01-24 NOTE — Care Management Important Message (Signed)
Important Message  Patient Details  Name: Richard [name] MRN: [id number] Date of Birth: [birth year]   Medicare Important Message Given:  Yes     Renie Ora 01/24/2021, 9:49 AM

## 2021-01-24 NOTE — Plan of Care (Signed)
Patient plan of care continues. Chronic hiccups continued during shift, new order for IV Reglan ordered by provider. Tolerated first dose well. Encouraged patient to use oral suction to manage secretions. After Reglan dose, secretions decreased. Patient with CT abdomen pending, awaiting CT department to send for patient. Possible discharge to CIR once cleared.   Problem: Education: Goal: Knowledge of General Education information will improve Description: Including pain rating scale, medication(s)/side effects and non-pharmacologic comfort measures Outcome: Progressing   Problem: Health Behavior/Discharge Planning: Goal: Ability to manage health-related needs will improve Outcome: Progressing   Problem: Clinical Measurements: Goal: Ability to maintain clinical measurements within normal limits will improve Outcome: Progressing Goal: Will remain free from infection Outcome: Progressing Goal: Diagnostic test results will improve Outcome: Progressing Goal: Respiratory complications will improve Outcome: Progressing Goal: Cardiovascular complication will be avoided Outcome: Progressing   Problem: Activity: Goal: Risk for activity intolerance will decrease Outcome: Progressing   Problem: Nutrition: Goal: Adequate nutrition will be maintained Outcome: Progressing   Problem: Coping: Goal: Level of anxiety will decrease Outcome: Progressing   Problem: Elimination: Goal: Will not experience complications related to bowel motility Outcome: Progressing Goal: Will not experience complications related to urinary retention Outcome: Progressing   Problem: Pain Managment: Goal: General experience of comfort will improve Outcome: Progressing   Problem: Safety: Goal: Ability to remain free from injury will improve Outcome: Progressing   Problem: Skin Integrity: Goal: Risk for impaired skin integrity will decrease Outcome: Progressing   Problem: Education: Goal: Ability to  demonstrate management of disease process will improve Outcome: Progressing Goal: Ability to verbalize understanding of medication therapies will improve Outcome: Progressing Goal: Individualized Educational Video(s) Outcome: Progressing   Problem: Activity: Goal: Capacity to carry out activities will improve Outcome: Progressing   Problem: Cardiac: Goal: Ability to achieve and maintain adequate cardiopulmonary perfusion will improve Outcome: Progressing

## 2021-01-24 NOTE — Progress Notes (Addendum)
Patient ID: Richard [name], male   DOB: 12-26-1960, 60 y.o.   MRN: [id number]     Advanced Heart Failure Rounding Note  PCP-Cardiologist: Dr. 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   Still has hiccups   CVP 5. Continues to diurese well with po Torsemide. -3.5 L last 24 hrs. No dyspnea or chest pain.   Creatinine stable at 1.21. Actually below prior baseline.    Objective:   Weight Range: 114 kg Body mass index is 29.8 kg/m.   Vital Signs:   Temp:  [97.6 F (36.4 C)-97.9 F (36.6 C)] 97.9 F (36.6 C) (08/01 0413) Pulse Rate:  [61-115] 66 (08/01 0413) Resp:  [12-21] 15 (08/01 0413) BP: (98-113)/(76-96) 108/84 (08/01 0413) SpO2:  [96 %-100 %] 100 % (08/01 0413) FiO2 (%):  [40 %-100 %] 40 % (07/31 2206) Weight:  [638 kg] 114 kg (08/01 0413) Last BM Date: 01/22/21  Weight change: Filed Weights   01/21/21 0428 01/22/21 0400 01/24/21 0413  Weight: 115.3 kg 121.6 kg 114 kg    Intake/Output:   Intake/Output Summary (Last 24 hours) at 01/24/2021 0711 Last data filed at 01/24/2021 0300 Gross per 24 hour  Intake 1612.82 ml  Output 5100 ml  Net -3487.18 ml      Physical Exam  CVP 5 General: NAD Neck: No JVD, no thyromegaly or thyroid nodule.  Lungs: Clear to auscultation bilaterally with normal respiratory effort. CV: Lateral PMI.  Heart regular S1/S2, no S3/S4, no murmur.   Abdomen: Soft, nontender, no hepatosplenomegaly, no distention.  Skin: Dressing noted on medial aspect of right great toe. Small wound right anterior shin.  Neurologic: Alert and oriented x 3.  Psych: Normal affect. Extremities: No clubbing or cyanosis. Trace RLE edema. L BKA, wound vac and limb protector in place. HEENT: Normal.    Telemetry   NSR 60s (personally reviewed)  Labs    CBC Recent Labs    01/23/21 0622 01/24/21 0539  WBC 19.5* 18.0*  NEUTROABS 15.8* 14.1*  HGB 14.8 14.3  HCT 45.7 44.7  MCV 87.5 89.0  PLT 366 362   Basic Metabolic Panel Recent Labs     01/23/21 0622 01/24/21 0539  NA 132* 135  K 4.8 4.5  CL 92* 92*  CO2 33* 36*  GLUCOSE 119* 90  BUN 43* 39*  CREATININE 1.27* 1.21  CALCIUM 8.7* 8.4*   Liver Function Tests No results for input(s): AST, ALT, ALKPHOS, BILITOT, PROT, ALBUMIN in the last 72 hours. No results for input(s): LIPASE, AMYLASE in the last 72 hours. Cardiac Enzymes No results for input(s): CKTOTAL, CKMB, CKMBINDEX, TROPONINI in the last 72 hours.   BNP: BNP (last 3 results) Recent Labs    10/16/20 1810 11/09/20 1519 01/15/21 1545  BNP 329.7* 373.4* 532.8*    ProBNP (last 3 results) No results for input(s): PROBNP in the last 8760 hours.   D-Dimer No results for input(s): DDIMER in the last 72 hours. Hemoglobin A1C No results for input(s): HGBA1C in the last 72 hours.  Fasting Lipid Panel No results for input(s): CHOL, HDL, LDLCALC, TRIG, CHOLHDL, LDLDIRECT in the last 72 hours. Thyroid Function Tests No results for input(s): TSH, T4TOTAL, T3FREE, THYROIDAB in the last 72 hours.  Invalid input(s): FREET3   Other results:   Imaging    No results found.   Medications:     Scheduled Medications:  allopurinol  100 mg Oral Daily   vitamin C  1,000 mg Oral Daily   aspirin  EC  81 mg Oral Daily   atorvastatin  40 mg Oral Daily   bisoprolol  2.5 mg Oral Daily   busPIRone  5 mg Oral BID   Chlorhexidine Gluconate Cloth  6 each Topical Daily   docusate sodium  100 mg Oral BID   DULoxetine  60 mg Oral Daily   empagliflozin  10 mg Oral Daily   feeding supplement (GLUCERNA SHAKE)  237 mL Oral BID BM   gabapentin  100 mg Oral TID   heparin  5,000 Units Subcutaneous Q8H   hydrALAZINE  10 mg Oral Q8H   insulin aspart  0-15 Units Subcutaneous TID WC   insulin aspart  0-5 Units Subcutaneous QHS   insulin aspart  4 Units Subcutaneous TID WC   insulin glargine  10 Units Subcutaneous BID   ipratropium-albuterol  3 mL Nebulization BID   isosorbide mononitrate  30 mg Oral Daily    multivitamin with minerals  1 tablet Oral Daily   nutrition supplement (JUVEN)  1 packet Oral BID BM   pantoprazole  40 mg Oral BID   povidone-iodine  2 application Topical Once   Ensure Max Protein  11 oz Oral QHS   senna  1 tablet Oral BID   sodium chloride flush  10-40 mL Intracatheter Q12H   spironolactone  12.5 mg Oral Daily   torsemide  40 mg Oral BID   zinc sulfate  220 mg Oral Daily    Infusions:  lactated ringers 10 mL/hr at 01/21/21 0946   magnesium sulfate bolus IVPB     penicillin g continuous IV infusion 12 Million Units (01/24/21 0200)    PRN Medications: acetaminophen, albuterol, alum & mag hydroxide-simeth, baclofen, bisacodyl, guaiFENesin-dextromethorphan, hydrALAZINE, HYDROcodone-acetaminophen, HYDROcodone-acetaminophen, labetalol, magnesium sulfate bolus IVPB, metoprolol tartrate, morphine injection, ondansetron, phenol, polyethylene glycol, potassium chloride, sodium chloride flush   Assessment/Plan   1. Acute on chronic systolic CHF:   -Primarily nonischemic cardiomyopathy.  Echo in 2017 with EF 15-20%.  [employer] ICD 2018. LHC/RHC 11/06/17 showed volume overload with 80% RCA stenosis.  Cardiac index low at 1.91. The degree of coronary disease does not explain his cardiomyopathy.  PYP scan in 7/21 not suggestive of transthyretin amyloidosis. Admitted 07/23 with acute on chronic CHF in setting of acute on chronic respiratory faliure with hypoxia/possible COPD exacerbation and group G strep bacteremia.   -Echo this admission with with EF 20-25%. TEE on 7/28: EF 15 to 20 percent, severely dilated LV with normal wall thickness and diffuse hypokinesis; moderately dilated Rv with moderate systolic dysfunction. No evidence of endocarditis  -Co-ox stable at 63%.  Creatinine further improved to 1.2 -CVP 5. Brisk diuresis noted. Weight down 17 lb overnight? Appears compensated. Decrease torsemide to 40 mg daily.  -Continue hydralazine 10 mg TID/Imdur 30 mg daily.  -Continue  bisoprolol 2.5 mg daily -Continue Jardiance 10 mg daily -Continue spiro 12.5 mg daily. -Add entresto 24/26 mg BID -Not adherent with medications PTA. Followed by paramedicine in the community. Has not kept most recent clinic f/u. Did not return calls to reschedule home visits this past month.   2. Acute on chronic hypoxic respiratory failure:  -Initially requiring BiPAP, 02 sats now stable.  -Likely d/t combination of COPD exacerbation and acute on chronic CHF. CCM saw and felt CHF more likely contributing to respiratory failure -Has home 02 but admits he does not use it regularly  3. COPD with possible acute exacerbation:  -Never smoked, but apparently had significant occupational exposure.  It appears that he won  a lawsuit dealing with the occupational exposure-related COPD. CPX in 2/21 showed severe functional limitation, but it appeared to be due to pulmonary abnormalities rather than HF.  He is on home oxygen.  -Treated for COPD exacerbation per triad  4. Group G strep bacteremia:  -Leg wounds suspected source. On PCN G through 8/5.  No endocarditis on TEE. 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 Also has RLE wounds. Needs right sided ABI. Contacted [place] to have this done today.  - Per ID, if no improvement in leukocytosis, may need imaging to evaluate for an occult abscess from seeding of group G strep bacteremia elsewhere. WBC trending down but remains elevated. -CT abdomen pelvis w/o contrast ordered by primary team.  5. CAD:   -LHC in 5/19 with 80-90% proximal RCA stenosis treated with DES.  This did not cause his cardiomyopathy, has a large RCA.  No chest pain.  -Continue aspirin and statin  6. Polycythemia:  -Likely related to chronic hypoxia. 7. OSA:  -Cannot tolerate CPAP.  Has oxygen to wear at night, but not adherent in community  8. DM II, uncontrolled:  -A1c 10.3 -He is on insulin.   -Restarted Jardiance  9. Hypertriglyceridemia:  -Off Vascepa due to blood  in stool that resolved. -Continue fenofibrate 145 mg daily.   10. AKI on CKD:  -Has stage IIIa CKD at baseline. Baseline creatinine 1.5 -Scr now actually below baseline    SDOH: -Not adherent with follow-ups or home care visits. Enrolled in Paramedicine in the community. Will need close f/u at discharge.  -Reports access to medications and transportation. Not taking medicines as prescribed PTA. -TOC consult.  Length of Stay: 9  FINCH, LINDSAY N, PA-C  01/24/2021, 7:11 AM  Advanced Heart Failure Team Pager [phone] (M-F; 7a - 5p)  Please contact [facility] for night-coverage after hours (5p -7a ) and weekends on [email or web address]   Patient seen with PA, agree with the above note.   No dyspnea.  No pain in right leg.   General: NAD Neck: No JVD, no thyromegaly or thyroid nodule.  Lungs: Clear to auscultation bilaterally with normal respiratory effort. CV: Nondisplaced PMI.  Heart regular S1/S2, no S3/S4, no murmur.  No peripheral edema.   Abdomen: Soft, nontender, no hepatosplenomegaly, no distention.  Skin: Intact without lesions or rashes.  Neurologic: Alert and oriented x 3.  Psych: Normal affect. Extremities: Left BKA. Wound right foot.  HEENT: Normal.   CVP 5, co-ox 64%.  Agree with decrease torsemide to 40 mg daily and restart Entresto 24/26 bid. Creatinine stable 1.21.   Getting CT abdomen/pelvis today with persistent leukocytosis. Continues PCN G for strep bacteremia.   Would get peripheral arterial doppler evaluation right leg given wounds.  Apparently was done at [facility] but cannot see the images and need to make sure no treatable vascular cause.   Marca Ancona 01/24/2021 9:33 AM

## 2021-01-24 NOTE — Progress Notes (Signed)
Patient is 5 days status post left below-knee amputation.  Patient is awake answers questions appropriately mild pain concerns.  Limb protector is in place wound VAC is in place with 2 green checks.  150 cc of bloody drainage.  100 more since last output.  From a orthopedic standpoint could be discharged with follow-up in our office in 1 week will be and should be transition to [facility] pump prior to discharge

## 2021-01-24 NOTE — Progress Notes (Signed)
VASCULAR LAB    ABIs have been performed.  See CV proc for preliminary results.   Shaylon Gillean, RVT 01/24/2021, 12:20 PM

## 2021-01-24 NOTE — TOC Progression Note (Addendum)
Transition of Care [facility]) - Progression Note    Patient Details  Name: Richard [name] MRN: [id number] Date of Birth: Dec 08, 1960  [facility] CM/SW Contact  Elliot Cousin, RN Phone Number:  [phone] 01/24/2021, 2:29 PM  Clinical Narrative:    Jory Sims CM contacted Ottie Glazier for referral to CIR. Pt HH arranged with [facility] if pt dc home. Waiting PT evaluation.  TOC CM spoke to pt about DME, states he does want wheelchair and bedside commode. Attempted call to wife to discuss shower chair. Unable to reach wife or leave message.   Expected Discharge Plan: Home w Home Health Services Barriers to Discharge: Continued Medical Work up  Expected Discharge Plan and Services Expected Discharge Plan: Home w Home Health Services In-house Referral: Clinical Social Work Discharge Planning Services: CM Consult Post Acute Care Choice: Durable Medical Equipment Living arrangements for the past 2 months: Single Family Home                           HH Arranged: RN, PT HH Agency: [facility] Date HH Agency Contacted: 01/20/21 Time HH Agency Contacted: 1526 Representative spoke with at [facility] Agency: Jeri Modena RN-[facility], [facility]   Social Determinants of Health (SDOH) Interventions    Readmission Risk Interventions No flowsheet data found.

## 2021-01-24 NOTE — PMR Pre-admission (Signed)
PMR Admission Coordinator Pre-Admission Assessment  Patient: Richard [name] is an 60 y.o., male MRN: [id number] DOB: Jan 14, 1961 Height: '6\' 5"'  (195.6 cm) Weight: 109.5 kg  Insurance Information HMO:     PPO:      PCP:      IPA:      80/20: yes     OTHER:  PRIMARY: Medicare Part A and B       Policy#: [id number]      Subscriber: Pt Phone#: Verified online    Fax#:  Pre-Cert#:       Employer:  Benefits:  Phone #:      Name:  Eff. Date: Parts A ad B effective 09/24/2004  Deduct: $1556      Out of Pocket Max:  None      Life Max: N/A  CIR: 100%      SNF: 100 days Outpatient: 80%     Co-Pay: 20% Home Health: 100%      Co-Pay: none DME: 80%     Co-Pay: 20% Providers: patient's choice  SECONDARY:Medicaid [place] Access   Policy#: [id number]     Phone#:   Financial Counselor:       Phone#:   The Engineer, [employer]" for patients in Inpatient Rehabilitation Facilities with attached "Privacy Act Early Records" was provided and verbally reviewed with: Patient  Emergency Contact Information Contact Information     Name Relation Home Work Mobile   [place]   [phone]   Brannick,Helen Mother [phone]     Medley,Dorothy Relative [phone]         Current Medical History  Patient Admitting Diagnosis: L BKA  History of Present Illness:Pt is a 60 y.o. male  with a past medical history of DM2, COPD (on 2L O2 baseline), CHF, AICD, CKD, HTN.male admitted 01/15/21 with SOB, body aches. Workup for CHF vs COPD exacerbation. 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 TEE was negative. Pt. 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 PT. Also with uncontrolled diabetes, with A1c of 10.3 on admission. CIR consulted to assist in return to PLOF  [name] medical record from [facility] has been reviewed by the rehabilitation admission coordinator and physician.  Past Medical History  Past Medical History:  Diagnosis Date   AICD (automatic cardioverter/defibrillator) present 01/11/2017   Anxiety    CHF (congestive heart failure) (HCC)    CKD (chronic kidney disease)    COPD (chronic obstructive pulmonary disease) ([place])    never smoker, industrial exposure   Diabetes mellitus with diabetic neuropathy, with long-term current use of insulin (HCC)    GERD (gastroesophageal reflux disease)    High cholesterol    History of gout    "take RX qd" (11/01/2017)   Hypertension    Nonobstructive atherosclerosis of coronary artery    On home oxygen therapy    "2L prn" (11/01/2017)   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  Prior Rehab/Hospitalizations Has the patient had prior rehab or hospitalizations prior to admission? No  Has the patient had major surgery during 100 days prior to admission? Yes   Current  Medications  Current Facility-Administered Medications:    acetaminophen (TYLENOL) tablet 325-650 mg, 325-650 mg, Oral, Q6H PRN, Newt Minion, MD   albuterol (PROVENTIL) (2.5 MG/3ML) 0.083% nebulizer solution 2.5 mg, 2.5 mg, Nebulization, Q2H PRN, Newt Minion, MD   allopurinol (ZYLOPRIM) tablet 100 mg, 100 mg, Oral, Daily, Newt Minion, MD, 100 mg at 01/27/21 1105   alum & mag hydroxide-simeth (MAALOX/MYLANTA) 200-200-20 MG/5ML suspension 15-30 mL, 15-30 mL, Oral, Q2H PRN, Newt Minion, MD, 30 mL at 01/26/21 1425   ascorbic acid (VITAMIN C) tablet 1,000 mg, 1,000 mg, Oral, Daily, Newt Minion, MD, 1,000 mg at 01/27/21 1105   aspirin EC tablet 81 mg, 81 mg, Oral, Daily, Newt Minion, MD, 81 mg at 01/27/21 1107   atorvastatin (LIPITOR) tablet 40 mg,  40 mg, Oral, Daily, Newt Minion, MD, 40 mg at 01/27/21 1106   baclofen (LIORESAL) tablet 5 mg, 5 mg, Oral, TID PRN, Regalado, Belkys A, MD, 5 mg at 01/26/21 2232   bisacodyl (DULCOLAX) EC tablet 5 mg, 5 mg, Oral, Daily PRN, Newt Minion, MD   bisoprolol (ZEBETA) tablet 2.5 mg, 2.5 mg, Oral, Daily, Skeet Latch, MD, 2.5 mg at 01/26/21 0911   busPIRone (BUSPAR) tablet 5 mg, 5 mg, Oral, BID, Regalado, Belkys A, MD, 5 mg at 01/27/21 1106   Chlorhexidine Gluconate Cloth 2 % PADS 6 each, 6 each, Topical, Daily, Newt Minion, MD, 6 each at 01/26/21 1200   docusate sodium (COLACE) capsule 100 mg, 100 mg, Oral, BID, Newt Minion, MD, 100 mg at 01/27/21 1106   DULoxetine (CYMBALTA) DR capsule 60 mg, 60 mg, Oral, Daily, Regalado, Belkys A, MD, 60 mg at 01/27/21 1107   empagliflozin (JARDIANCE) tablet 10 mg, 10 mg, Oral, Daily, Newt Minion, MD, 10 mg at 01/27/21 1106   gabapentin (NEURONTIN) capsule 100 mg, 100 mg, Oral, TID, Newt Minion, MD, 100 mg at 01/27/21 1052   guaiFENesin-dextromethorphan (ROBITUSSIN DM) 100-10 MG/5ML syrup 15 mL, 15 mL, Oral, Q4H PRN, Newt Minion, MD   heparin injection 5,000 Units, 5,000 Units, Subcutaneous, Q8H, Newt Minion, MD, 5,000 Units at 01/27/21 4010   hydrALAZINE (APRESOLINE) injection 5 mg, 5 mg, Intravenous, Q20 Min PRN, Newt Minion, MD   hydrALAZINE (APRESOLINE) tablet 10 mg, 10 mg, Oral, Q8H, Newt Minion, MD, 10 mg at 01/26/21 2232   HYDROcodone-acetaminophen (NORCO) 7.5-325 MG per tablet 1-2 tablet, 1-2 tablet, Oral, Q4H PRN, Newt Minion, MD, 2 tablet at 01/27/21 0640   HYDROcodone-acetaminophen (NORCO/VICODIN) 5-325 MG per tablet 1-2 tablet, 1-2 tablet, Oral, Q4H PRN, Newt Minion, MD, 2 tablet at 01/26/21 2232   insulin aspart (novoLOG) injection 0-15 Units, 0-15 Units, Subcutaneous, TID WC, Newt Minion, MD, 2 Units at 01/26/21 1719   insulin aspart (novoLOG) injection 0-5 Units, 0-5 Units, Subcutaneous, QHS, Newt Minion, MD,  2 Units at 01/23/21 2216   insulin aspart (novoLOG) injection 4 Units, 4 Units, Subcutaneous, TID WC, Newt Minion, MD, 4 Units at 01/27/21 0754   insulin glargine (LANTUS) injection 10 Units, 10 Units, Subcutaneous, BID, Newt Minion, MD, 10 Units at 01/27/21 1107   ipratropium-albuterol (DUONEB) 0.5-2.5 (3) MG/3ML nebulizer solution 3 mL, 3 mL, Nebulization, BID PRN, Regalado, Belkys A, MD   isosorbide mononitrate (IMDUR) 24 hr tablet 30 mg, 30 mg, Oral, Daily, Meridee Score V, MD, 30 mg at 01/27/21 1053   labetalol (NORMODYNE) injection 10 mg, 10 mg, Intravenous, Q10 min PRN, Sharol Given,  Illene Regulus, MD   lactated ringers infusion, , Intravenous, Continuous, Newt Minion, MD, Last Rate: 10 mL/hr at 01/21/21 0946, New Bag at 01/21/21 0946   magnesium sulfate IVPB 2 g 50 mL, 2 g, Intravenous, Daily PRN, Newt Minion, MD   metoCLOPramide (REGLAN) injection 5 mg, 5 mg, Intravenous, Q6H, Regalado, Belkys A, MD, 5 mg at 01/27/21 0640   metoprolol tartrate (LOPRESSOR) injection 2-5 mg, 2-5 mg, Intravenous, Q2H PRN, Newt Minion, MD   morphine 2 MG/ML injection 0.5-1 mg, 0.5-1 mg, Intravenous, Q2H PRN, Newt Minion, MD, 1 mg at 01/22/21 1308   multivitamin with minerals tablet 1 tablet, 1 tablet, Oral, Daily, Newt Minion, MD, 1 tablet at 01/27/21 1053   nutrition supplement (JUVEN) (JUVEN) powder packet 1 packet, 1 packet, Oral, BID BM, Newt Minion, MD, 1 packet at 01/27/21 1108   ondansetron [facility]) injection 4 mg, 4 mg, Intravenous, Q6H PRN, Newt Minion, MD, 4 mg at 01/24/21 0920   pantoprazole (PROTONIX) EC tablet 40 mg, 40 mg, Oral, BID, Newt Minion, MD, 40 mg at 01/27/21 1053   penicillin G potassium 12 Million Units in dextrose 5 % 250 mL continuous infusion, 12 Million Units, Intravenous, Q12H, Vu, Trung T, MD, Last Rate: 20.8 mL/hr at 01/27/21 0157, 12 Million Units at 01/27/21 0157   phenol (CHLORASEPTIC) mouth spray 1 spray, 1 spray, Mouth/Throat, PRN, Newt Minion, MD    polyethylene glycol (MIRALAX / GLYCOLAX) packet 17 g, 17 g, Oral, Daily PRN, Newt Minion, MD   potassium chloride SA (KLOR-CON) CR tablet 20-40 mEq, 20-40 mEq, Oral, Daily PRN, Newt Minion, MD   povidone-iodine 10 % swab 2 application, 2 application, Topical, Once, Newt Minion, MD   sacubitril-valsartan (ENTRESTO) 24-26 mg per tablet, 1 tablet, Oral, BID, Joette Catching, PA-C, 1 tablet at 01/27/21 1107   senna (SENOKOT) tablet 8.6 mg, 1 tablet, Oral, BID, Newt Minion, MD, 8.6 mg at 01/27/21 1054   sodium chloride flush (NS) 0.9 % injection 10-40 mL, 10-40 mL, Intracatheter, Q12H, Newt Minion, MD, 10 mL at 01/27/21 1108   sodium chloride flush (NS) 0.9 % injection 10-40 mL, 10-40 mL, Intracatheter, PRN, Newt Minion, MD, 10 mL at 01/25/21 2225   spironolactone (ALDACTONE) tablet 12.5 mg, 12.5 mg, Oral, Daily, Newt Minion, MD, 12.5 mg at 01/27/21 1053   torsemide (DEMADEX) tablet 40 mg, 40 mg, Oral, Daily, Joette Catching, PA-C, 40 mg at 01/27/21 1052   zinc sulfate capsule 220 mg, 220 mg, Oral, Daily, Newt Minion, MD, 220 mg at 01/27/21 1052  Patients Current Diet:  Diet Order             Diet - low sodium heart healthy           Diet Carb Modified Fluid consistency: Thin; Room service appropriate? Yes  Diet effective now                   Precautions / Restrictions Precautions Precautions: Fall, Other (comment) Precaution Comments: LLE wound vac Other Brace: L BKA shrinker and limb protector Restrictions Weight Bearing Restrictions: Yes LLE Weight Bearing: Non weight bearing   Has the patient had 2 or more falls or a fall with injury in the past year? Yes  Prior Activity Level Community (5-7x/wk): Active in the community PTA  Prior Functional Level Self Care: Did the patient need help bathing, dressing, using the toilet or eating? Independent  Indoor  Mobility: Did the patient need assistance with walking from room to room (with or without  device)? Independent  Stairs: Did the patient need assistance with internal or external stairs (with or without device)? Needed some help  Functional Cognition: Did the patient need help planning regular tasks such as shopping or remembering to take medications? Needed some help  Home Assistive Devices / Equipment Home Equipment: Kasandra Knudsen - single point  Prior Device Use: Indicate devices/aids used by the patient prior to current illness, exacerbation or injury? None of the above  Current Functional Level Cognition  Overall Cognitive Status: No family/caregiver present to determine baseline cognitive functioning Current Attention Level: Selective Orientation Level: Oriented X4 Following Commands: Follows one step commands with increased time Safety/Judgement: Decreased awareness of safety, Decreased awareness of deficits General Comments: Pt slightly impulsive today; frequently using Bipap to 'recover' (pt reports due to "losing my breath" but seems to be more related to anxiety); multimodal cues for sequencing, problem solving and safety; pt making moaning noises throughout session but stating "I'm fine"    Extremity Assessment (includes Sensation/Coordination)  Upper Extremity Assessment: Generalized weakness (red grips in room on silverware)  Lower Extremity Assessment: Defer to PT evaluation RLE Deficits / Details: Chronic leg wounds with bandaging noted LLE Deficits / Details: Chronic leg wounds with bandaging noted all around foot    ADLs  Overall ADL's : Needs assistance/impaired Eating/Feeding: Independent, Sitting Grooming: Wash/dry face, Wash/dry hands, Set up, Sitting Grooming Details (indicate cue type and reason): on BSC during toileting Upper Body Bathing: Set up, Sitting Lower Body Bathing: Maximal assistance, Sitting/lateral leans Upper Body Dressing : Minimal assistance, Sitting Lower Body Dressing: Maximal assistance, Sitting/lateral leans Lower Body Dressing Details  (indicate cue type and reason): to don sock on RLE - maybe benefit from sock aide? Toilet Transfer: Min guard, Cueing for safety, Squat-pivot (lateral scoot transfer) Toilet Transfer Details (indicate cue type and reason): to the right to [facility] squat pivot/lateral scoot Toileting- Clothing Manipulation and Hygiene: Maximal assistance, Sit to/from stand, +2 for safety/equipment Toileting - Clothing Manipulation Details (indicate cue type and reason): Pt able to come to standing with max A, then 2nd therapist provided peri care while Pt maintained standing for approx 2 min Functional mobility during ADLs:  (squat pivot) General ADL Comments: significant decline in ability to complete ADL tasks s/p BKA. Educated on strategies for LB ADLs via lateral leans or in bed to maximize independence and decrease fall risk (while also progressing standing balance). Educated on built up grip use for utensils during meals with pt reporting "this will work" but declined to actually attempt task    Mobility  Overal bed mobility: Needs Assistance Bed Mobility: Supine to Sit Supine to sit: Min guard, HOB elevated Sit to supine: Supervision General bed mobility comments: Increased time and effort, heavy use of bed rail; assist for lines    Transfers  Overall transfer level: Needs assistance Equipment used: None, Rolling walker (2 wheeled) Transfers: Lateral/Scoot Transfers, Sit to/from Stand Sit to Stand: Mod assist, +2 safety/equipment Stand pivot transfers: Min assist, Min guard, +2 safety/equipment Squat pivot transfers: Min assist, +2 safety/equipment  Lateral/Scoot Transfers: Min assist, +2 physical assistance, From elevated surface General transfer comment: Pt with urgency to have BM requiring max verbal cues for safety/lines prior to performing lateral scoot to drop-arm BSC, minA for stability (assist for set-up and lines); additional stand from [facility] with modA for trunk elevation and stability, heavy reliance  on momentum and UE support to power into  standing; poor eccentric control into sitting as pt quick to fatigue and attempting to sit prematurely requiring modA+2 for safe lowering; increased time to recover after each transfer, pt anxiously asking for bipap "to catch my breath"    Ambulation / Gait / Stairs / Wheelchair Mobility  Ambulation/Gait Ambulation/Gait assistance: Mod assist, +2 safety/equipment Gait Distance (Feet): 2 Feet Assistive device: Rolling walker (2 wheeled) Gait Pattern/deviations: Step-through pattern, Decreased stride length General Gait Details: Pt able to take 3-4 complete hops on RLE with RW and modA for stability; quick to fatigue reverting to scooting pivot on RLE with RW in order to complete transfer to recliner; max verbal cues for sequencing and safety as pt attempting to sit prematurely Gait velocity: decreased Gait velocity interpretation: 1.31 - 2.62 ft/sec, indicative of limited community ambulator    Posture / Balance Dynamic Sitting Balance Sitting balance - Comments: Static sitting with supervision, able to perform lateral weight shifts without LOB Balance Overall balance assessment: Needs assistance Sitting-balance support: No upper extremity supported, Feet supported Sitting balance-Leahy Scale: Good Sitting balance - Comments: Static sitting with supervision, able to perform lateral weight shifts without LOB Standing balance support: Bilateral upper extremity supported Standing balance-Leahy Scale: Poor Standing balance comment: Reliant on BUE support and external assist; dependent for posterior pericare while standing    Special needs/care consideration Wound Vac in place   Previous Home Environment (from acute therapy documentation) Living Arrangements: Spouse/significant other, Children  Lives With: Family Available Help at Discharge: Family, Available PRN/intermittently Type of Home: House Home Layout: Two level, Able to live on main level with  bedroom/bathroom, Full bath on main level Home Access: Stairs to enter Entrance Stairs-Rails: None Entrance Stairs-Number of Steps: 3 Bathroom Shower/Tub: Chiropodist: Standard Bathroom Accessibility: Yes How Accessible: Accessible via walker Additional Comments: Pt reports family will not assist him much.  Discharge Living Setting Plans for Discharge Living Setting: [name] home Type of Home at Discharge: House Discharge Home Layout: Two level, Able to live on main level with bedroom/bathroom Alternate Level Stairs-Number of Steps: full flight Discharge Home Access: Stairs to enter Entrance Stairs-Rails: None Entrance Stairs-Number of Steps: 3 Discharge Bathroom Shower/Tub: Tub/shower unit, Walk-in shower Discharge Bathroom Toilet: Standard Discharge Bathroom Accessibility: Yes How Accessible: Accessible via walker  Social/Family/Support Systems Patient Roles: Spouse Contact Information: [phone] Anticipated Caregiver: Lauro Regulus (signiciant other) Anticipated Caregiver's Contact Information: [phone] Ability/Limitations of Caregiver: Family can provide supervision only Caregiver Availability: 24/7 Discharge Plan Discussed with Primary Caregiver: Yes Is Caregiver In Agreement with Plan?: Yes Does Caregiver/Family have Issues with Lodging/Transportation while Pt is in Rehab?: No  Goals Patient/Family Goal for Rehab: PT/OT Supervision Expected length of stay: 14-16 days Pt/Family Agrees to Admission and willing to participate: Yes Program Orientation Provided & Reviewed with Pt/Caregiver Including Roles  & Responsibilities: Yes  Decrease burden of Care through IP rehab admission: Specialzed equipment needs, Decrease number of caregivers, Bowel and bladder program, and Patient/family education  Possible need for SNF placement upon discharge: not anticipated  Patient Condition: I have reviewed medical records from [facility],  spoken with CM, and patient. I met with patient at the bedside for inpatient rehabilitation assessment.  Patient will benefit from ongoing PT and OT, can actively participate in 3 hours of therapy a day 5 days of the week, and can make measurable gains during the admission.  Patient will also benefit from the coordinated team approach during an Inpatient Acute Rehabilitation admission.  The patient will receive intensive  therapy as well as Rehabilitation physician, nursing, social worker, and care management interventions.  Due to safety, skin/wound care, disease management, medication administration, pain management, and patient education the patient requires 24 hour a day rehabilitation nursing.  The patient is currently mod+2  with mobility and basic ADLs.  Discharge setting and therapy post discharge at home with home health is anticipated.  Patient has agreed to participate in the Acute Inpatient Rehabilitation Program and will admit today.  Preadmission Screen Completed By:  Clemens Catholic, SLP [facility] with updates by Retta Diones, 01/27/2021 11:12 AM ______________________________________________________________________   Discussed status with Dr. Naaman Plummer  on [id number] at 60 and received approval for admission today.  Admission Coordinator:  Retta Diones, RN, time 979 /Date 01/27/2021   Assessment/Plan: Diagnosis: left bka Does the need for close, 24 hr/day Medical supervision in concert with the patient's rehab needs make it unreasonable for this patient to be served in a less intensive setting? Yes Co-Morbidities requiring supervision/potential complications: DM, COPD/OSA, HTN Due to bladder management, bowel management, safety, skin/wound care, disease management, medication administration, pain management, and patient education, does the patient require 24 hr/day rehab nursing? Yes Does the patient require coordinated care of a physician, rehab nurse, PT, OT to address physical and functional  deficits in the context of the above medical diagnosis(es)? Yes Addressing deficits in the following areas: balance, endurance, locomotion, strength, transferring, bowel/bladder control, bathing, dressing, feeding, grooming, toileting, and psychosocial support Can the patient actively participate in an intensive therapy program of at least 3 hrs of therapy 5 days a week? Yes The potential for patient to make measurable gains while on inpatient rehab is excellent Anticipated functional outcomes upon discharge from inpatient rehab: supervision PT, supervision OT, n/a SLP Estimated rehab length of stay to reach the above functional goals is: 14-16 days Anticipated discharge destination: Home 10. Overall Rehab/Functional Prognosis: excellent   MD Signature: Meredith Staggers, MD, [place] [facility] 01/27/2021

## 2021-01-25 ENCOUNTER — Inpatient Hospital Stay (HOSPITAL_COMMUNITY): Payer: Medicare Other

## 2021-01-25 ENCOUNTER — Other Ambulatory Visit (HOSPITAL_COMMUNITY): Payer: Medicare Other

## 2021-01-25 DIAGNOSIS — J9622 Acute and chronic respiratory failure with hypercapnia: Secondary | ICD-10-CM | POA: Diagnosis not present

## 2021-01-25 DIAGNOSIS — Q859 Phakomatosis, unspecified: Secondary | ICD-10-CM

## 2021-01-25 DIAGNOSIS — J9621 Acute and chronic respiratory failure with hypoxia: Secondary | ICD-10-CM | POA: Diagnosis not present

## 2021-01-25 LAB — GLUCOSE, CAPILLARY
Glucose-Capillary: 95 mg/dL (ref 70–99)
Glucose-Capillary: 108 mg/dL — ABNORMAL HIGH (ref 70–99)
Glucose-Capillary: 178 mg/dL — ABNORMAL HIGH (ref 70–99)
Glucose-Capillary: 154 mg/dL — ABNORMAL HIGH (ref 70–99)

## 2021-01-25 LAB — DIFFERENTIAL
Abs Immature Granulocytes: 0.31 10*3/uL — ABNORMAL HIGH (ref 0.00–0.07)
Basophils Absolute: 0.1 10*3/uL (ref 0.0–0.1)
Basophils Relative: 0 %
Eosinophils Absolute: 0.1 10*3/uL (ref 0.0–0.5)
Eosinophils Relative: 0 %
Immature Granulocytes: 2 %
Lymphocytes Relative: 11 %
Lymphs Abs: 2.1 10*3/uL (ref 0.7–4.0)
Monocytes Absolute: 1.7 10*3/uL — ABNORMAL HIGH (ref 0.1–1.0)
Monocytes Relative: 9 %
Neutro Abs: 14.6 10*3/uL — ABNORMAL HIGH (ref 1.7–7.7)
Neutrophils Relative %: 78 %

## 2021-01-25 LAB — CBC
HCT: 47.3 % (ref 39.0–52.0)
Hemoglobin: 14.9 g/dL (ref 13.0–17.0)
MCH: 28.3 pg (ref 26.0–34.0)
MCHC: 31.5 g/dL (ref 30.0–36.0)
MCV: 89.9 fL (ref 80.0–100.0)
Platelets: 410 10*3/uL — ABNORMAL HIGH (ref 150–400)
RBC: 5.26 MIL/uL (ref 4.22–5.81)
RDW: 18.8 % — ABNORMAL HIGH (ref 11.5–15.5)
WBC: 18.8 10*3/uL — ABNORMAL HIGH (ref 4.0–10.5)
nRBC: 0 % (ref 0.0–0.2)

## 2021-01-25 LAB — BASIC METABOLIC PANEL
Anion gap: 8 (ref 5–15)
BUN: 36 mg/dL — ABNORMAL HIGH (ref 6–20)
CO2: 35 mmol/L — ABNORMAL HIGH (ref 22–32)
Calcium: 8.6 mg/dL — ABNORMAL LOW (ref 8.9–10.3)
Chloride: 91 mmol/L — ABNORMAL LOW (ref 98–111)
Creatinine, Ser: 1.42 mg/dL — ABNORMAL HIGH (ref 0.61–1.24)
GFR, Estimated: 57 mL/min — ABNORMAL LOW (ref >60)
Glucose, Bld: 76 mg/dL (ref 70–99)
Potassium: 4.9 mmol/L (ref 3.5–5.1)
Sodium: 134 mmol/L — ABNORMAL LOW (ref 135–145)

## 2021-01-25 LAB — COOXEMETRY PANEL
Carboxyhemoglobin: 1.1 % (ref 0.5–1.5)
Methemoglobin: 1.1 % (ref 0.0–1.5)
O2 Saturation: 57 %
Total hemoglobin: 15.3 g/dL (ref 12.0–16.0)

## 2021-01-25 MED ORDER — METOCLOPRAMIDE HCL 5 MG/ML IJ SOLN
5.0000 mg | Freq: Four times a day (QID) | INTRAMUSCULAR | Status: DC
  Administered 2021-01-25 – 2021-01-27 (×9): 5 mg via INTRAVENOUS
  Filled 2021-01-25 (×9): qty 2

## 2021-01-25 MED ORDER — METOCLOPRAMIDE HCL 5 MG/ML IJ SOLN
5.0000 mg | Freq: Once | INTRAMUSCULAR | Status: AC
  Administered 2021-01-25: 5 mg via INTRAVENOUS
  Filled 2021-01-25: qty 2

## 2021-01-25 NOTE — Progress Notes (Addendum)
[facility] Infectious Diseases Follow Up Note  Patient Identification: Patient Name: Richard [name] MRN: [id number] Admit Date: 01/15/2021  3:18 PM Age: 60 y.o.Today's Date: 01/25/2021   Reason for Visit: leukocytosis  Principal Problem:   Acute on chronic respiratory failure with hypoxia and hypercapnia (HCC) Active Problems:   Hypertension   CKD (chronic kidney disease)   Presence of implantable cardioverter-defibrillator (ICD)   GERD (gastroesophageal reflux disease)   Diabetes mellitus with diabetic neuropathy, with long-term current use of insulin (HCC)   COPD exacerbation (HCC)   Chronic systolic CHF (congestive heart failure) (HCC)   Acute renal failure superimposed on stage 3 chronic kidney disease (HCC)   Elevated troponin   Hyponatremia   Bacteremia   Chronic ulcer of left ankle (HCC)   Severe sepsis without septic shock (HCC)   Open wound   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HCC)   SOB (shortness of breath)   S/P BKA (below knee amputation), left (HCC)   Antibiotics:  Azithromycin 7/23-7/27 Vancomycin 7/24-7/25 Ceftriaxone 7/23-7/25, Pen G 7/26-c   Lines/Tubes:PIVs, RT arm PICC   Interval Events: continues to remain afebrile, leukocytosis has been fluctuating and lately has been around 18   Assessment Leukocytosis Afebrile, no issues with PICC.  Denies respiratory symptoms/GI symptoms and GU symptoms excep hiccoughs going on for 14 days  CT abd/pelvis unremarkable except LLL pulmonary mass  Group G Streptococcus bacteremia  Blood cx 7/23 1/2 sets with Group G strep and staph hominis ( potentially this could be a contaminant given 2 different organisms ),  blood cultures cleared on 7/24 TTE and TEE with no vegetations  LLL pulmonary mass ( 3.5 cm *3.3 cm), seems to be enlarging: CT chest 12/17/20 favored benign indolent lesion such as possible hamartoma although a low grade malignancy is not fully excluded.    NICMP s/p AICD  Chronic Left Heel Ulcer s/p BKA on 7/29; Stump has a wound vac in place. Occasional pain with mpvement but no tenderness/swelling on exam. Ortho following   Recommendations Low suspicion for new source of infection currently. Can continue previously outlined antibiotic course as stated by Dr Renold Don.  Repeat CBC w diff tomorrow ( ordered ) Defer work up of LLL pulmonary mass to primary/pulmonary   Rest of the management as per the primary team. Thank you for the consult. Please page with pertinent questions or concerns.  ______________________________________________________________________ Subjective patient seen and examined at the bedside.  He is sitting up in chair.  On nasal cannula.  Denies any chest pain shortness of breath or cough.  Denies any nausea or vomiting abdominal pain or diarrhea.  Denies any GU symptoms.  Denies any peripheral joint pain swelling or rashes.  Denies any back pain.  Denies any pain tenderness in the PICC line site.  Complains of hiccups for 14 days.  Denies any tenderness swelling at the left BKA stump site.  He overall feels improving.   Vitals BP 102/76 (BP Location: Left Arm)   Pulse 73   Temp 98.9 F (37.2 C) (Oral)   Resp 18   Ht 6\' 5"  (1.956 m)   Wt 114 kg   SpO2 100%   BMI 29.80 kg/m     Physical Exam Constitutional:  obese male sitting up in chair and not in acute distress  Comments:   Cardiovascular:     Rate and Rhythm: Normal rate and regular rhythm.     Heart sounds:   Pulmonary:     Effort: on 2 L [place].     Comments:   Abdominal:     Palpations: Abdomen is soft.     Tenderness: Non tender but distended  Musculoskeletal:        General: No swelling or tenderness. Left BKA stump has a wound vac - no swelling and tenderness  Skin:    Comments: No lesions or rashes   Neurological:     General: No focal deficit present.   Psychiatric:        Mood and Affect: Mood normal.    Pertinent Microbiology Results for orders placed or performed during the hospital encounter of 01/15/21  Culture, blood (routine x 2)     Status: None   Collection Time: 01/15/21  3:41 PM   Specimen: BLOOD RIGHT HAND  Result Value Ref Range Status   Specimen Description BLOOD RIGHT HAND  Final   Special Requests   Final    BOTTLES DRAWN AEROBIC AND ANAEROBIC Blood Culture adequate volume   Culture   Final    NO GROWTH 5 DAYS Performed at [facility], [address] [place], [place] [id number]    Report Status 01/20/2021 FINAL  Final  Resp Panel by RT-PCR (Flu A&B, Covid) Nasopharyngeal Swab     Status: None   Collection Time: 01/15/21  3:45 PM   Specimen: Nasopharyngeal Swab; Nasopharyngeal(NP) swabs in vial transport medium  Result Value Ref Range Status   SARS Coronavirus 2 by RT PCR NEGATIVE NEGATIVE Final    Comment: (NOTE) SARS-CoV-2 target nucleic acids are NOT DETECTED.  The SARS-CoV-2 RNA is generally detectable in upper respiratory specimens during the acute phase of infection. The lowest concentration of SARS-CoV-2 viral copies this assay can detect is 138 copies/mL. A negative result does not preclude SARS-Cov-2 infection and should not be used as the sole basis for treatment or other patient management decisions. A negative result may occur with  improper specimen collection/handling, submission of specimen other than nasopharyngeal swab, presence of viral mutation(s) within the areas targeted by this assay, and inadequate number of viral copies(<138 copies/mL). A negative result must be combined with clinical observations, patient history, and epidemiological information. The expected result is Negative.  Fact Sheet for Patients:  BloggerCourse.comhttps://www.fda.gov/media/152166/download  Fact Sheet for Healthcare Providers:  SeriousBroker.ithttps://www.fda.gov/media/152162/download  This test is no t yet approved or cleared by the [place] FDA and  has been authorized for  detection and/or diagnosis of SARS-CoV-2 by FDA under an Emergency Use Authorization (EUA). This EUA will remain  in effect (meaning this test can be used) for the duration of the COVID-19 declaration under Section 564(b)(1) of the Act, 21 U.S.C.section 360bbb-3(b)(1), unless the authorization is terminated  or revoked sooner.       Influenza A by PCR NEGATIVE NEGATIVE Final   Influenza B by PCR NEGATIVE NEGATIVE Final    Comment: (NOTE) The Xpert Xpress SARS-CoV-2/FLU/RSV plus assay is intended as an aid in the diagnosis of influenza from Nasopharyngeal swab specimens and should not be used as a sole basis for treatment. Nasal washings and aspirates are unacceptable for Xpert Xpress SARS-CoV-2/FLU/RSV testing.  Fact Sheet for Patients: BloggerCourse.comhttps://www.fda.gov/media/152166/download  Fact Sheet for Healthcare Providers: SeriousBroker.ithttps://www.fda.gov/media/152162/download  This test is not yet approved or cleared by the [place] FDA and has been authorized for detection and/or diagnosis of  SARS-CoV-2 by FDA under an Emergency Use Authorization (EUA). This EUA will remain in effect (meaning this test can be used) for the duration of the COVID-19 declaration under Section 564(b)(1) of the Act, 21 U.S.C. section 360bbb-3(b)(1), unless the authorization is terminated or revoked.  Performed at [facility], [address], [place], [place] [address]   Culture, blood (routine x 2)     Status: Abnormal   Collection Time: 01/15/21  6:05 PM   Specimen: BLOOD LEFT FOREARM  Result Value Ref Range Status   Specimen Description BLOOD LEFT FOREARM  Final   Special Requests   Final    BOTTLES DRAWN AEROBIC AND ANAEROBIC Blood Culture adequate volume   Culture  Setup Time   Final    GRAM POSITIVE COCCI IN CHAINS AEROBIC BOTTLE ONLY CRITICAL RESULT CALLED TO, READ BACK BY AND VERIFIED WITH: PHARM D K.HAMMONS ON [id number] AT 0935 BY E.PARRISH    Culture (A)  Final    STREPTOCOCCUS GROUP  G STAPHYLOCOCCUS HOMINIS CRITICAL RESULT CALLED TO, READ BACK BY AND VERIFIED WITH: PHARMD JEREMY F. [phone] FCP THE SIGNIFICANCE OF ISOLATING THIS ORGANISM FROM A SINGLE VENIPUNCTURE CANNOT BE PREDICTED WITHOUT FURTHER CLINICAL AND CULTURE CORRELATION. SUSCEPTIBILITIES AVAILABLE ONLY ON REQUEST. Performed at [facility], [address] [place], [place] [address]    Report Status 01/19/2021 FINAL  Final   Organism ID, Bacteria STREPTOCOCCUS GROUP G  Final      Susceptibility   Streptococcus group g - MIC*    CLINDAMYCIN RESISTANT Resistant     AMPICILLIN <=0.25 SENSITIVE Sensitive     ERYTHROMYCIN 4 RESISTANT Resistant     VANCOMYCIN 0.5 SENSITIVE Sensitive     CEFTRIAXONE <=0.12 SENSITIVE Sensitive     LEVOFLOXACIN 1 SENSITIVE Sensitive     PENICILLIN Value in next row Sensitive      SENSITIVEMIC <=0.06    * STREPTOCOCCUS GROUP G  Blood Culture ID Panel (Reflexed)     Status: Abnormal   Collection Time: 01/15/21  6:05 PM  Result Value Ref Range Status   Enterococcus faecalis NOT DETECTED NOT DETECTED Final   Enterococcus Faecium NOT DETECTED NOT DETECTED Final   Listeria monocytogenes NOT DETECTED NOT DETECTED Final   Staphylococcus species DETECTED (A) NOT DETECTED Final    Comment: CRITICAL RESULT CALLED TO, READ BACK BY AND VERIFIED WITH: PHARM D K.HAMMONS ON [id number] AT 0935 BY E.PARRISH    Staphylococcus aureus (BCID) NOT DETECTED NOT DETECTED Final   Staphylococcus epidermidis NOT DETECTED NOT DETECTED Final   Staphylococcus lugdunensis NOT DETECTED NOT DETECTED Final   Streptococcus species DETECTED (A) NOT DETECTED Final    Comment: Not Enterococcus species, Streptococcus agalactiae, Streptococcus pyogenes, or Streptococcus pneumoniae. CRITICAL RESULT CALLED TO, READ BACK BY AND VERIFIED WITH: PHARM D K.HAMMONS ON [id number] AT 0935 BY E.PARRISH    Streptococcus agalactiae NOT DETECTED NOT DETECTED Final   Streptococcus pneumoniae NOT DETECTED NOT DETECTED  Final   Streptococcus pyogenes NOT DETECTED NOT DETECTED Final   A.calcoaceticus-baumannii NOT DETECTED NOT DETECTED Final   Bacteroides fragilis NOT DETECTED NOT DETECTED Final   Enterobacterales NOT DETECTED NOT DETECTED Final   Enterobacter cloacae complex NOT DETECTED NOT DETECTED Final   Escherichia coli NOT DETECTED NOT DETECTED Final   Klebsiella aerogenes NOT DETECTED NOT DETECTED Final   Klebsiella oxytoca NOT DETECTED NOT DETECTED Final   Klebsiella pneumoniae NOT DETECTED NOT DETECTED Final   Proteus species NOT DETECTED NOT DETECTED Final   Salmonella species NOT  DETECTED NOT DETECTED Final   Serratia marcescens NOT DETECTED NOT DETECTED Final   Haemophilus influenzae NOT DETECTED NOT DETECTED Final   Neisseria meningitidis NOT DETECTED NOT DETECTED Final   Pseudomonas aeruginosa NOT DETECTED NOT DETECTED Final   Stenotrophomonas maltophilia NOT DETECTED NOT DETECTED Final   Candida albicans NOT DETECTED NOT DETECTED Final   Candida auris NOT DETECTED NOT DETECTED Final   Candida glabrata NOT DETECTED NOT DETECTED Final   Candida krusei NOT DETECTED NOT DETECTED Final   Candida parapsilosis NOT DETECTED NOT DETECTED Final   Candida tropicalis NOT DETECTED NOT DETECTED Final   Cryptococcus neoformans/gattii NOT DETECTED NOT DETECTED Final    Comment: Performed at [facility], [address], [place], [place] [address]  MRSA Next Gen by PCR, Nasal     Status: Abnormal   Collection Time: 01/15/21 10:38 PM   Specimen: Nasal Mucosa; Nasal Swab  Result Value Ref Range Status   MRSA by PCR Next Gen DETECTED (A) NOT DETECTED Final    Comment: RESULT CALLED TO, READ BACK BY AND VERIFIED WITH: RN M.SHULL ON [id number] AT 1121 BY E.PARRISH (NOTE) The GeneXpert MRSA Assay (FDA approved for NASAL specimens only), is one component of a comprehensive MRSA colonization surveillance program. It is not intended to diagnose MRSA infection nor to guide or monitor treatment for MRSA  infections. Test performance is not FDA approved in patients less than 44 years old. Performed at [facility], [address], [place], [place] [address]   Culture, blood (routine x 2)     Status: None   Collection Time: 01/16/21 11:38 AM   Specimen: BLOOD  Result Value Ref Range Status   Specimen Description BLOOD RIGHT ANTECUBITAL  Final   Special Requests   Final    BOTTLES DRAWN AEROBIC ONLY Blood Culture adequate volume   Culture   Final    NO GROWTH 6 DAYS Performed at [facility], [address] [place], [place] [address]    Report Status 01/22/2021 FINAL  Final  Culture, blood (routine x 2)     Status: None   Collection Time: 01/16/21 11:39 AM   Specimen: BLOOD RIGHT HAND  Result Value Ref Range Status   Specimen Description BLOOD RIGHT HAND  Final   Special Requests   Final    BOTTLES DRAWN AEROBIC ONLY Blood Culture results may not be optimal due to an inadequate volume of blood received in culture bottles   Culture   Final    NO GROWTH 6 DAYS Performed at [facility], [address] [place], [place] [address]    Report Status 01/22/2021 FINAL  Final  Surgical pcr screen     Status: Abnormal   Collection Time: 01/21/21 12:34 AM   Specimen: Nasal Mucosa; Nasal Swab  Result Value Ref Range Status   MRSA, PCR NEGATIVE NEGATIVE Final   Staphylococcus aureus POSITIVE (A) NEGATIVE Final    Comment: (NOTE) The Xpert SA Assay (FDA approved for NASAL specimens in patients 4 years of age and older), is one component of a comprehensive surveillance program. It is not intended to diagnose infection nor to guide or monitor treatment. Performed at [facility], [address] [place], [place] [address]      Pertinent Lab. CBC Latest Ref Rng & Units 01/25/2021 01/24/2021 01/23/2021  WBC 4.0 - 10.5 K/uL 18.8(H) 18.0(H) 19.5(H)  Hemoglobin 13.0 - 17.0 g/dL 63.1 49.7 02.6  Hematocrit 39.0 - 52.0 %  47.3 44.7 45.7  Platelets 150 - 400 K/uL 410(H) 362  366   CMP Latest Ref Rng & Units 01/25/2021 01/24/2021 01/23/2021  Glucose 70 - 99 mg/dL 76 90 762(U)  BUN 6 - 20 mg/dL 63(F) 35(K) 56(Y)  Creatinine 0.61 - 1.24 mg/dL 5.63(S) 9.37 3.42(A)  Sodium 135 - 145 mmol/L 134(L) 135 132(L)  Potassium 3.5 - 5.1 mmol/L 4.9 4.5 4.8  Chloride 98 - 111 mmol/L 91(L) 92(L) 92(L)  CO2 22 - 32 mmol/L 35(H) 36(H) 33(H)  Calcium 8.9 - 10.3 mg/dL 7.6(O) 1.1(X) 7.2(I)  Total Protein 6.5 - 8.1 g/dL - - -  Total Bilirubin 0.3 - 1.2 mg/dL - - -  Alkaline Phos 38 - 126 U/L - - -  AST 15 - 41 U/L - - -  ALT 0 - 44 U/L - - -     Pertinent Imaging today 01/25/21 Plain films and CT images have been personally visualized and interpreted; radiology reports have been reviewed. Decision making incorporated into the Impression / Recommendations.  CT abdomen/pelvis  IMPRESSION: 1. No acute findings in the abdomen or pelvis. Specifically, no findings to explain the patient's history of sepsis. 2. 3.5 x 3.3 cm left lower lobe pulmonary mass has been present since at least 11/24/2015 when it measured 2.5 x 2.4 cm. This lesion was better evaluated on a recent chest CT of 12/17/2020. 3. Cholelithiasis. 4. 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   I spent more than 35 minutes for this patient encounter including review of prior medical records, coordination of care  with greater than 50% of time being face to face/counseling and discussing diagnostics/treatment plan with the patient/family.  Electronically signed by:   Odette Fraction, MD Infectious Disease Physician [facility] for Infectious Disease Pager: [phone]

## 2021-01-25 NOTE — TOC CM/SW Note (Signed)
[facility] TOC CM received message for [facility] that they may have bed for pt today. Isidoro Donning RN CCM, [facility] ED TOC CM [phone]

## 2021-01-25 NOTE — Progress Notes (Signed)
PROGRESS NOTE    Richard [name]  ZHY:[id number] DOB: 1960/08/19 DOA: 01/15/2021 PCP: Ananias Pilgrim, MD   Brief Narrative: 60 year old with past medical history significant for diabetes type 2, COPD on chronic oxygen 2 L, systolic heart failure status post AICD, venous stasis ulcer, hypertension, CKD, pulmonary nodule ([place]) patient with increased shortness of breath, no fever, no chest pain.  Noted to have oxygen saturation 87 on room air with EMS.  4 L [place] applied by EMS and gave 1 nebulizer, Solu-Medrol.  CCM was consulted for worsening hypoxia and patient being placed on BiPAP.  Patient was admitted for further management.  Patient admitted with acute on chronic hypoxic respiratory failure secondary to acute on chronic combined heart failure exacerbation, he was also found to have severe sepsis secondary to group G bacteremia from left heel ulcer, necrotizing infection and possible early changes of osteomyelitis left foot.  Dr. Lajoyce Corners was consulted and patient underwent BKA on 7/29.  He has persistent leukocytosis, for this reason CT abdomen and pelvis was obtained.  ID will follow-up on patient today.  He continues to have intractable hiccups plan to change frequency of Reglan today.  Follow-up CT abdomen and pelvis.  Assessment & Plan:   Principal Problem:   Acute on chronic respiratory failure with hypoxia and hypercapnia (HCC) Active Problems:   Hypertension   CKD (chronic kidney disease)   Presence of implantable cardioverter-defibrillator (ICD)   GERD (gastroesophageal reflux disease)   Diabetes mellitus with diabetic neuropathy, with long-term current use of insulin (HCC)   COPD exacerbation (HCC)   Chronic systolic CHF (congestive heart failure) (HCC)   Acute renal failure superimposed on stage 3 chronic kidney disease (HCC)   Elevated troponin   Hyponatremia   Bacteremia   Chronic ulcer of left ankle (HCC)   Severe sepsis without septic shock (HCC)   Open wound    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HCC)   SOB (shortness of breath)   S/P BKA (below knee amputation), left (HCC)  1-Acute on Chronic Hypoxic Respiratory Failure, acute on chronic combined heart failure, AICD -Patient required BiPAP on admission.  Currently on nasal cannula oxygen for mentation -Elevation of BNP and flat troponin.  Chest x-ray with mild vascular congestion. -Echo showed ejection fraction 20 to 25% -Cardiology  consulted appreciate recommendation. -Continue with hydralazine, Imdur, torsemide and  spironolactone.  -Bisoprolol, digoxin and Entresto on hold. -Continue with Torsemide.  Stable.   Severe sepsis likely secondary to group G bacteremia Likely secondary to chronic left heel unstageable ulcer.  -Patient tachycardic, tachypneic L a more than 2 leukocytosis -Cultures x2 gross strep group G, Staphylococcus  -Repeated blood culture 7/24: No growth to date -ID Following and recommend TEE. TEE negative -Dr Lajoyce Corners consulted. Recommended BKA.  -CT:  Plantar soft tissue wound with adjacent swelling and soft tissue gas concerning for necrotizing infection, with possible early changes of osteomyelitis involving the plantar aspect of the calcaneus near the plantar fascia insertion. No visible abscess within the field of view. Further evaluation with MRI could be considered to determine extent of involvement. Nondisplaced anterior calcaneal process fracture along the lateral aspect. -Continue with IV penicillin G. Needs 7 days from 7/29---8/05 if discharge prior to 8/05 we can discharge him on Cefadroxil 1 gr BID.  -If Leukocytosis  fail to improved, might need to look for other sources. Checking CT abdomen.  -SP BKA 7/29 -ID will follow up on patient today.   Possible COPD exacerbation Completed Azithromycin Completed prednisone 7/28. Continue  with  nebulizer treatments  Diabetes type 2: Continue with Lantus and sliding scale insulin A1c 10  AKI on CKD stage III: Baseline  creatinine 1.5  CAD/HLD Status post PCI Continue with aspirin and statin.  Chronic venous stasis ulcer: Status postdebridement of left foot wound on 01/13/2021 at OSH Wound care following.  Hiccup:  Started baclofen PRN, on PPI to twice daily Started on Gabapentin.  Plan to check CT abdomen pelvis.  Started  Reglan 8/01, change frequency today.   Depression;  Resume Cymbalta and buspar.   Vascular. ABI order to evaluate right LE>  Resting right ankle-brachial index is within normal range. No  evidence of significant right lower extremity arterial disease. The right  toe-brachial index is abnormal. ABIs are unreliable.   Nutrition Problem: Increased nutrient needs Etiology: wound healing    Signs/Symptoms: estimated needs    Interventions: Glucerna shake, MVI, Premier Protein  Estimated body mass index is 29.8 kg/m as calculated from the following:   Height as of this encounter: 6\' 5"  (1.956 m).   Weight as of this encounter: 114 kg.   DVT prophylaxis: Heparin Code Status: Full code Family Communication: Care discussed with patient.  Disposition Plan:  Status is: Inpatient  Remains inpatient appropriate because:IV treatments appropriate due to intensity of illness or inability to take PO  Dispo: The patient is from: Home              Anticipated d/c is to: Home              Patient currently is not medically stable to d/c.   Difficult to place patient No        Consultants:  Cardiology ID  Procedures:    Antimicrobials:    Subjective: He is uncomfortable with hiccups. He needs his Cymbalta. Explain to him he is getting cymbalta.  Had had Large BM today.  He feels he chock when he has Hiccups. .    Objective: Vitals:   01/25/21 0427 01/25/21 0500 01/25/21 0732 01/25/21 1123  BP: 108/80  115/88 102/76  Pulse: 65  77 73  Resp: 18  19 18   Temp:   98.5 F (36.9 C) 98.9 F (37.2 C)  TempSrc:   Oral Oral  SpO2: 98%  100% 100%  Weight:  114 kg     Height:        Intake/Output Summary (Last 24 hours) at 01/25/2021 1254 Last data filed at 01/25/2021 0815 Gross per 24 hour  Intake 1413.25 ml  Output 2600 ml  Net -1186.75 ml    Filed Weights   01/24/21 0413 01/25/21 0016 01/25/21 0500  Weight: 114 kg 114 kg 114 kg    Examination:  General exam: NAD Respiratory system: CTA Cardiovascular system: S 1, S 2 RRR Gastrointestinal system: BS present, soft, nt Central nervous system: Alert Extremities: S/P BKA wound vac and limb protector.   Data Reviewed: I have personally reviewed following labs and imaging studies  CBC: Recent Labs  Lab 01/20/21 0357 01/21/21 0332 01/22/21 0334 01/23/21 0622 01/24/21 0539 01/25/21 0559  WBC 34.1* 30.7* 16.9* 19.5* 18.0* 18.8*  NEUTROABS 29.9* 26.4* 12.8* 15.8* 14.1*  --   HGB 16.1 14.9 14.2 14.8 14.3 14.9  HCT 49.1 45.4 43.7 45.7 44.7 47.3  MCV 87.7 87.1 88.5 87.5 89.0 89.9  PLT 255 313 336 366 362 410*    Basic Metabolic Panel: Recent Labs  Lab 01/21/21 0446 01/22/21 0334 01/23/21 0622 01/24/21 0539 01/25/21 0559  NA 131* 131* 132*  135 134*  K 4.7 4.7 4.8 4.5 4.9  CL 92* 94* 92* 92* 91*  CO2 32 30 33* 36* 35*  GLUCOSE 160* 186* 119* 90 76  BUN 57* 49* 43* 39* 36*  CREATININE 1.45* 1.37* 1.27* 1.21 1.42*  CALCIUM 8.9 8.3* 8.7* 8.4* 8.6*    GFR: Estimated Creatinine Clearance: 79.5 mL/min (A) (by C-G formula based on SCr of 1.42 mg/dL (H)). Liver Function Tests: Recent Labs  Lab 01/19/21 0500 01/20/21 0357 01/21/21 0332  AST 26 21 24   ALT 25 22 24   ALKPHOS 70 65 83  BILITOT 1.4* 1.4* 0.7  PROT 8.1 7.9 7.9  ALBUMIN 2.2* 2.1* 2.0*    No results for input(s): LIPASE, AMYLASE in the last 168 hours. No results for input(s): AMMONIA in the last 168 hours. Coagulation Profile: No results for input(s): INR, PROTIME in the last 168 hours. Cardiac Enzymes: No results for input(s): CKTOTAL, CKMB, CKMBINDEX, TROPONINI in the last 168 hours.  BNP (last 3  results) No results for input(s): PROBNP in the last 8760 hours. HbA1C: No results for input(s): HGBA1C in the last 72 hours.  CBG: Recent Labs  Lab 01/24/21 1307 01/24/21 1558 01/24/21 2117 01/25/21 0626 01/25/21 1124  GLUCAP 151* 165* 110* 95 108*    Lipid Profile: No results for input(s): CHOL, HDL, LDLCALC, TRIG, CHOLHDL, LDLDIRECT in the last 72 hours. Thyroid Function Tests: No results for input(s): TSH, T4TOTAL, FREET4, T3FREE, THYROIDAB in the last 72 hours. Anemia Panel: No results for input(s): VITAMINB12, FOLATE, FERRITIN, TIBC, IRON, RETICCTPCT in the last 72 hours. Sepsis Labs: No results for input(s): PROCALCITON, LATICACIDVEN in the last 168 hours.   Recent Results (from the past 240 hour(s))  Culture, blood (routine x 2)     Status: None   Collection Time: 01/15/21  3:41 PM   Specimen: BLOOD RIGHT HAND  Result Value Ref Range Status   Specimen Description BLOOD RIGHT HAND  Final   Special Requests   Final    BOTTLES DRAWN AEROBIC AND ANAEROBIC Blood Culture adequate volume   Culture   Final    NO GROWTH 5 DAYS Performed at [facility], [address], [place], [place] [address]    Report Status 01/20/2021 FINAL  Final  Resp Panel by RT-PCR (Flu A&B, Covid) Nasopharyngeal Swab     Status: None   Collection Time: 01/15/21  3:45 PM   Specimen: Nasopharyngeal Swab; Nasopharyngeal(NP) swabs in vial transport medium  Result Value Ref Range Status   SARS Coronavirus 2 by RT PCR NEGATIVE NEGATIVE Final    Comment: (NOTE) SARS-CoV-2 target nucleic acids are NOT DETECTED.  The SARS-CoV-2 RNA is generally detectable in upper respiratory specimens during the acute phase of infection. The lowest concentration of SARS-CoV-2 viral copies this assay can detect is 138 copies/mL. A negative result does not preclude SARS-Cov-2 infection and should not be used as the sole basis for treatment or other patient management decisions. A negative result may occur with   improper specimen collection/handling, submission of specimen other than nasopharyngeal swab, presence of viral mutation(s) within the areas targeted by this assay, and inadequate number of viral copies(<138 copies/mL). A negative result must be combined with clinical observations, patient history, and epidemiological information. The expected result is Negative.  Fact Sheet for Patients:  BloggerCourse.com  Fact Sheet for Healthcare Providers:  SeriousBroker.it  This test is no t yet approved or cleared by the [place] FDA and  has been authorized for detection and/or diagnosis  of SARS-CoV-2 by FDA under an Emergency Use Authorization (EUA). This EUA will remain  in effect (meaning this test can be used) for the duration of the COVID-19 declaration under Section 564(b)(1) of the Act, 21 U.S.C.section 360bbb-3(b)(1), unless the authorization is terminated  or revoked sooner.       Influenza A by PCR NEGATIVE NEGATIVE Final   Influenza B by PCR NEGATIVE NEGATIVE Final    Comment: (NOTE) The Xpert Xpress SARS-CoV-2/FLU/RSV plus assay is intended as an aid in the diagnosis of influenza from Nasopharyngeal swab specimens and should not be used as a sole basis for treatment. Nasal washings and aspirates are unacceptable for Xpert Xpress SARS-CoV-2/FLU/RSV testing.  Fact Sheet for Patients: BloggerCourse.com  Fact Sheet for Healthcare Providers: SeriousBroker.it  This test is not yet approved or cleared by the [place] FDA and has been authorized for detection and/or diagnosis of SARS-CoV-2 by FDA under an Emergency Use Authorization (EUA). This EUA will remain in effect (meaning this test can be used) for the duration of the COVID-19 declaration under Section 564(b)(1) of the Act, 21 U.S.C. section 360bbb-3(b)(1), unless the authorization is terminated  or revoked.  Performed at [facility], [address], [place], [place] [address]   Culture, blood (routine x 2)     Status: Abnormal   Collection Time: 01/15/21  6:05 PM   Specimen: BLOOD LEFT FOREARM  Result Value Ref Range Status   Specimen Description BLOOD LEFT FOREARM  Final   Special Requests   Final    BOTTLES DRAWN AEROBIC AND ANAEROBIC Blood Culture adequate volume   Culture  Setup Time   Final    GRAM POSITIVE COCCI IN CHAINS AEROBIC BOTTLE ONLY CRITICAL RESULT CALLED TO, READ BACK BY AND VERIFIED WITH: PHARM D K.HAMMONS ON [id number] AT 0935 BY E.PARRISH    Culture (A)  Final    STREPTOCOCCUS GROUP G STAPHYLOCOCCUS HOMINIS CRITICAL RESULT CALLED TO, READ BACK BY AND VERIFIED WITH: PHARMD JEREMY F. [id number] FCP THE SIGNIFICANCE OF ISOLATING THIS ORGANISM FROM A SINGLE VENIPUNCTURE CANNOT BE PREDICTED WITHOUT FURTHER CLINICAL AND CULTURE CORRELATION. SUSCEPTIBILITIES AVAILABLE ONLY ON REQUEST. Performed at [facility], [address], [place], [place] [address]    Report Status 01/19/2021 FINAL  Final   Organism ID, Bacteria STREPTOCOCCUS GROUP G  Final      Susceptibility   Streptococcus group g - MIC*    CLINDAMYCIN RESISTANT Resistant     AMPICILLIN <=0.25 SENSITIVE Sensitive     ERYTHROMYCIN 4 RESISTANT Resistant     VANCOMYCIN 0.5 SENSITIVE Sensitive     CEFTRIAXONE <=0.12 SENSITIVE Sensitive     LEVOFLOXACIN 1 SENSITIVE Sensitive     PENICILLIN Value in next row Sensitive      SENSITIVEMIC <=0.06    * STREPTOCOCCUS GROUP G  Blood Culture ID Panel (Reflexed)     Status: Abnormal   Collection Time: 01/15/21  6:05 PM  Result Value Ref Range Status   Enterococcus faecalis NOT DETECTED NOT DETECTED Final   Enterococcus Faecium NOT DETECTED NOT DETECTED Final   Listeria monocytogenes NOT DETECTED NOT DETECTED Final   Staphylococcus species DETECTED (A) NOT DETECTED Final    Comment: CRITICAL RESULT CALLED TO, READ BACK BY AND VERIFIED WITH: PHARM  D K.HAMMONS ON [id number] AT 0935 BY E.PARRISH    Staphylococcus aureus (BCID) NOT DETECTED NOT DETECTED Final   Staphylococcus epidermidis NOT DETECTED NOT DETECTED Final   Staphylococcus lugdunensis NOT DETECTED NOT DETECTED Final  Streptococcus species DETECTED (A) NOT DETECTED Final    Comment: Not Enterococcus species, Streptococcus agalactiae, Streptococcus pyogenes, or Streptococcus pneumoniae. CRITICAL RESULT CALLED TO, READ BACK BY AND VERIFIED WITH: PHARM D K.HAMMONS ON [id number] AT 0935 BY E.PARRISH    Streptococcus agalactiae NOT DETECTED NOT DETECTED Final   Streptococcus pneumoniae NOT DETECTED NOT DETECTED Final   Streptococcus pyogenes NOT DETECTED NOT DETECTED Final   A.calcoaceticus-baumannii NOT DETECTED NOT DETECTED Final   Bacteroides fragilis NOT DETECTED NOT DETECTED Final   Enterobacterales NOT DETECTED NOT DETECTED Final   Enterobacter cloacae complex NOT DETECTED NOT DETECTED Final   Escherichia coli NOT DETECTED NOT DETECTED Final   Klebsiella aerogenes NOT DETECTED NOT DETECTED Final   Klebsiella oxytoca NOT DETECTED NOT DETECTED Final   Klebsiella pneumoniae NOT DETECTED NOT DETECTED Final   Proteus species NOT DETECTED NOT DETECTED Final   Salmonella species NOT DETECTED NOT DETECTED Final   Serratia marcescens NOT DETECTED NOT DETECTED Final   Haemophilus influenzae NOT DETECTED NOT DETECTED Final   Neisseria meningitidis NOT DETECTED NOT DETECTED Final   Pseudomonas aeruginosa NOT DETECTED NOT DETECTED Final   Stenotrophomonas maltophilia NOT DETECTED NOT DETECTED Final   Candida albicans NOT DETECTED NOT DETECTED Final   Candida auris NOT DETECTED NOT DETECTED Final   Candida glabrata NOT DETECTED NOT DETECTED Final   Candida krusei NOT DETECTED NOT DETECTED Final   Candida parapsilosis NOT DETECTED NOT DETECTED Final   Candida tropicalis NOT DETECTED NOT DETECTED Final   Cryptococcus neoformans/gattii NOT DETECTED NOT DETECTED Final    Comment:  Performed at [facility], [address], [place], [place] [address]  MRSA Next Gen by PCR, Nasal     Status: Abnormal   Collection Time: 01/15/21 10:38 PM   Specimen: Nasal Mucosa; Nasal Swab  Result Value Ref Range Status   MRSA by PCR Next Gen DETECTED (A) NOT DETECTED Final    Comment: RESULT CALLED TO, READ BACK BY AND VERIFIED WITH: RN M.SHULL ON [id number] AT 1121 BY E.PARRISH (NOTE) The GeneXpert MRSA Assay (FDA approved for NASAL specimens only), is one component of a comprehensive MRSA colonization surveillance program. It is not intended to diagnose MRSA infection nor to guide or monitor treatment for MRSA infections. Test performance is not FDA approved in patients less than 55 years old. Performed at [facility], [address] [place], [place] [address]   Culture, blood (routine x 2)     Status: None   Collection Time: 01/16/21 11:38 AM   Specimen: BLOOD  Result Value Ref Range Status   Specimen Description BLOOD RIGHT ANTECUBITAL  Final   Special Requests   Final    BOTTLES DRAWN AEROBIC ONLY Blood Culture adequate volume   Culture   Final    NO GROWTH 6 DAYS Performed at [facility], [address] [place], [place] [address]    Report Status 01/22/2021 FINAL  Final  Culture, blood (routine x 2)     Status: None   Collection Time: 01/16/21 11:39 AM   Specimen: BLOOD RIGHT HAND  Result Value Ref Range Status   Specimen Description BLOOD RIGHT HAND  Final   Special Requests   Final    BOTTLES DRAWN AEROBIC ONLY Blood Culture results may not be optimal due to an inadequate volume of blood received in culture bottles   Culture   Final    NO GROWTH 6 DAYS Performed at [facility], [address]  [place], [place] [address]    Report Status 01/22/2021 FINAL  Final  Surgical pcr screen     Status: Abnormal   Collection Time: 01/21/21 12:34 AM   Specimen: Nasal Mucosa; Nasal Swab  Result Value Ref Range Status   MRSA, PCR NEGATIVE  NEGATIVE Final   Staphylococcus aureus POSITIVE (A) NEGATIVE Final    Comment: (NOTE) The Xpert SA Assay (FDA approved for NASAL specimens in patients 48 years of age and older), is one component of a comprehensive surveillance program. It is not intended to diagnose infection nor to guide or monitor treatment. Performed at [facility], [address], [place], [place] [address]           Radiology Studies: VAS [place] ABI WITH/WO TBI  Result Date: 01/24/2021  LOWER EXTREMITY DOPPLER STUDY Patient Name:  Richard [name]  Date of Exam:   01/24/2021 Medical Rec #: [id number]     Accession #:    [id number] Date of Birth: [birth year]    Patient Gender: M Patient Age:   20Y Exam Location:  [facility] Procedure:      VAS [place] ABI WITH/WO TBI Referring Phys: [id number] MARCUS V DUDA --------------------------------------------------------------------------------  Indications: Ulceration. High Risk Factors: Diabetes, coronary artery disease. Other Factors: Left BKA.  Comparison Study: No prior study on file Performing Technologist: Sherren Kerns RVS  Examination Guidelines: A complete evaluation includes at minimum, Doppler waveform signals and systolic blood pressure reading at the level of bilateral brachial, anterior tibial, and posterior tibial arteries, when vessel segments are accessible. Bilateral testing is considered an integral part of a complete examination. Photoelectric Plethysmograph (PPG) waveforms and toe systolic pressure readings are included as required and additional duplex testing as needed. Limited examinations for reoccurring indications may be performed as noted.  ABI Findings: +---------+------------------+-----+----------+----------+ Right    Rt Pressure (mmHg)IndexWaveform  Comment    +---------+------------------+-----+----------+----------+ Brachial                                  restricted +---------+------------------+-----+----------+----------+ PTA      107                1.06 monophasic           +---------+------------------+-----+----------+----------+ DP       88                0.87 monophasic           +---------+------------------+-----+----------+----------+ Great Toe41                0.41                      +---------+------------------+-----+----------+----------+ +---------+------------------+-----+---------+-------+ Left     Lt Pressure (mmHg)IndexWaveform Comment +---------+------------------+-----+---------+-------+ Brachial 101                    triphasic        +---------+------------------+-----+---------+-------+ PTA                                      BKA     +---------+------------------+-----+---------+-------+ DP                                       BKA     +---------+------------------+-----+---------+-------+  Great Toe                                BKA     +---------+------------------+-----+---------+-------+ +-------+-----------+-----------+------------+------------+ ABI/TBIToday's ABIToday's TBIPrevious ABIPrevious TBI +-------+-----------+-----------+------------+------------+ Right  1.06       0.41                                +-------+-----------+-----------+------------+------------+ Left   BKA                                            +-------+-----------+-----------+------------+------------+  Summary: Right: Resting right ankle-brachial index is within normal range. No evidence of significant right lower extremity arterial disease. The right toe-brachial index is abnormal. ABIs are unreliable. Left: BKA.  *See table(s) above for measurements and observations.  Electronically signed by Fabienne Bruns MD on 01/24/2021 at 3:29:46 PM.    Final         Scheduled Meds:  allopurinol  100 mg Oral Daily   vitamin C  1,000 mg Oral Daily   aspirin EC  81 mg Oral Daily   atorvastatin  40 mg Oral Daily   bisoprolol  2.5 mg Oral Daily   busPIRone  5 mg Oral BID    Chlorhexidine Gluconate Cloth  6 each Topical Daily   docusate sodium  100 mg Oral BID   DULoxetine  60 mg Oral Daily   empagliflozin  10 mg Oral Daily   gabapentin  100 mg Oral TID   heparin  5,000 Units Subcutaneous Q8H   hydrALAZINE  10 mg Oral Q8H   insulin aspart  0-15 Units Subcutaneous TID WC   insulin aspart  0-5 Units Subcutaneous QHS   insulin aspart  4 Units Subcutaneous TID WC   insulin glargine  10 Units Subcutaneous BID   isosorbide mononitrate  30 mg Oral Daily   metoCLOPramide (REGLAN) injection  5 mg Intravenous Q6H   multivitamin with minerals  1 tablet Oral Daily   nutrition supplement (JUVEN)  1 packet Oral BID BM   pantoprazole  40 mg Oral BID   povidone-iodine  2 application Topical Once   sacubitril-valsartan  1 tablet Oral BID   senna  1 tablet Oral BID   sodium chloride flush  10-40 mL Intracatheter Q12H   spironolactone  12.5 mg Oral Daily   torsemide  40 mg Oral Daily   zinc sulfate  220 mg Oral Daily   Continuous Infusions:  lactated ringers 10 mL/hr at 01/21/21 0946   magnesium sulfate bolus IVPB     penicillin g continuous IV infusion 12 Million Units (01/25/21 0202)     LOS: 10 days    Time spent: 35 minutes.     Alba Cory, MD [facility]   If 7PM-7AM, please contact night-coverage [email or web address]  01/25/2021, 12:54 PM

## 2021-01-25 NOTE — Progress Notes (Signed)
Inpatient Rehab Admissions Coordinator:   I continue to await medical clearance for CIR admission. I will continue to follow for potential admit pending medical readiness and bed availability.  Megan Salon, MS, CCC-SLP Rehab Admissions Coordinator  [phone] (celll) [phone] (office)

## 2021-01-25 NOTE — Progress Notes (Signed)
RT placed pt on BIPAP V60 for the night on settings of 12/6 BUR 8 30%. Pt tolerating well, respiratory status stable at this time w/no distress noted. RT will continue to monitor.

## 2021-01-25 NOTE — Progress Notes (Addendum)
Patient ID: Richard [name], male   DOB: 1960/09/13, 60 y.o.   MRN: [id number]     Advanced Heart Failure Rounding Note  PCP-Cardiologist: Dr. 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   CT abd today with results pending. WBC 18.8   CO-OX 57%. Creatinine 1.4   Complaining hiccups.    Objective:   Weight Range: 114 kg Body mass index is 29.8 kg/m.   Vital Signs:   Temp:  [97 F (36.1 C)-98.7 F (37.1 C)] 98.5 F (36.9 C) (08/02 0732) Pulse Rate:  [57-77] 77 (08/02 0732) Resp:  [16-19] 19 (08/02 0732) BP: (98-115)/(68-88) 115/88 (08/02 0732) SpO2:  [96 %-100 %] 100 % (08/02 0732) Weight:  [937 kg] 114 kg (08/02 0500) Last BM Date: 01/25/21  Weight change: Filed Weights   01/24/21 0413 01/25/21 0016 01/25/21 0500  Weight: 114 kg 114 kg 114 kg    Intake/Output:   Intake/Output Summary (Last 24 hours) at 01/25/2021 0937 Last data filed at 01/25/2021 0815 Gross per 24 hour  Intake 1943.7 ml  Output 3400 ml  Net -1456.3 ml      Physical Exam  General:  Sitting on the side of the bed.  No resp difficulty HEENT: normal Neck: supple. JVP difficult to assess with hiccups.  Carotids 2+ bilat; no bruits. No lymphadenopathy or thryomegaly appreciated. Cor: PMI nondisplaced. Regular rate & rhythm. No rubs, gallops or murmurs. Lungs: clear Abdomen: soft, nontender, nondistended. No hepatosplenomegaly. No bruits or masses. Good bowel sounds. Extremities: no cyanosis, clubbing, rash, edema. L BKA.  Neuro: alert & orientedx3, cranial nerves grossly intact. moves all 4 extremities w/o difficulty. Affect flat    Telemetry  NSR 60-70s   Labs    CBC Recent Labs    01/23/21 0622 01/24/21 0539 01/25/21 0559  WBC 19.5* 18.0* 18.8*  NEUTROABS 15.8* 14.1*  --   HGB 14.8 14.3 14.9  HCT 45.7 44.7 47.3  MCV 87.5 89.0 89.9  PLT 366 362 410*   Basic Metabolic Panel Recent Labs    16/96/78 0539 01/25/21 0559  NA 135 134*  K 4.5 4.9  CL 92* 91*  CO2 36* 35*   GLUCOSE 90 76  BUN 39* 36*  CREATININE 1.21 1.42*  CALCIUM 8.4* 8.6*   Liver Function Tests No results for input(s): AST, ALT, ALKPHOS, BILITOT, PROT, ALBUMIN in the last 72 hours. No results for input(s): LIPASE, AMYLASE in the last 72 hours. Cardiac Enzymes No results for input(s): CKTOTAL, CKMB, CKMBINDEX, TROPONINI in the last 72 hours.   BNP: BNP (last 3 results) Recent Labs    10/16/20 1810 11/09/20 1519 01/15/21 1545  BNP 329.7* 373.4* 532.8*    ProBNP (last 3 results) No results for input(s): PROBNP in the last 8760 hours.   D-Dimer No results for input(s): DDIMER in the last 72 hours. Hemoglobin A1C No results for input(s): HGBA1C in the last 72 hours.  Fasting Lipid Panel No results for input(s): CHOL, HDL, LDLCALC, TRIG, CHOLHDL, LDLDIRECT in the last 72 hours. Thyroid Function Tests No results for input(s): TSH, T4TOTAL, T3FREE, THYROIDAB in the last 72 hours.  Invalid input(s): FREET3   Other results:   Imaging    VAS [place] ABI WITH/WO TBI  Result Date: 01/24/2021  LOWER EXTREMITY DOPPLER STUDY Patient Name:  Richard [name]  Date of Exam:   01/24/2021 Medical Rec #: [id number]     Accession #:    [id number] Date of Birth: 04/02/1961    Patient Gender: M Patient  Age:   71Y Exam Location:  [facility] Procedure:      VAS [place] ABI WITH/WO TBI Referring Phys: [id number] MARCUS V DUDA --------------------------------------------------------------------------------  Indications: Ulceration. High Risk Factors: Diabetes, coronary artery disease. Other Factors: Left BKA.  Comparison Study: No prior study on file Performing Technologist: Sherren Kerns RVS  Examination Guidelines: A complete evaluation includes at minimum, Doppler waveform signals and systolic blood pressure reading at the level of bilateral brachial, anterior tibial, and posterior tibial arteries, when vessel segments are accessible. Bilateral testing is considered an integral part of a complete  examination. Photoelectric Plethysmograph (PPG) waveforms and toe systolic pressure readings are included as required and additional duplex testing as needed. Limited examinations for reoccurring indications may be performed as noted.  ABI Findings: +---------+------------------+-----+----------+----------+ Right    Rt Pressure (mmHg)IndexWaveform  Comment    +---------+------------------+-----+----------+----------+ Brachial                                  restricted +---------+------------------+-----+----------+----------+ PTA      107               1.06 monophasic           +---------+------------------+-----+----------+----------+ DP       88                0.87 monophasic           +---------+------------------+-----+----------+----------+ Great Toe41                0.41                      +---------+------------------+-----+----------+----------+ +---------+------------------+-----+---------+-------+ Left     Lt Pressure (mmHg)IndexWaveform Comment +---------+------------------+-----+---------+-------+ Brachial 101                    triphasic        +---------+------------------+-----+---------+-------+ PTA                                      BKA     +---------+------------------+-----+---------+-------+ DP                                       BKA     +---------+------------------+-----+---------+-------+ Great Toe                                BKA     +---------+------------------+-----+---------+-------+ +-------+-----------+-----------+------------+------------+ ABI/TBIToday's ABIToday's TBIPrevious ABIPrevious TBI +-------+-----------+-----------+------------+------------+ Right  1.06       0.41                                +-------+-----------+-----------+------------+------------+ Left   BKA                                            +-------+-----------+-----------+------------+------------+  Summary: Right: Resting  right ankle-brachial index is within normal range. No evidence of significant right lower extremity arterial disease. The right toe-brachial index is abnormal. ABIs are unreliable. Left: BKA.  *See table(s) above for measurements and observations.  Electronically signed by Fabienne Bruns MD on 01/24/2021 at 3:29:46 PM.    Final      Medications:     Scheduled Medications:  allopurinol  100 mg Oral Daily   vitamin C  1,000 mg Oral Daily   aspirin EC  81 mg Oral Daily   atorvastatin  40 mg Oral Daily   bisoprolol  2.5 mg Oral Daily   busPIRone  5 mg Oral BID   Chlorhexidine Gluconate Cloth  6 each Topical Daily   docusate sodium  100 mg Oral BID   DULoxetine  60 mg Oral Daily   empagliflozin  10 mg Oral Daily   gabapentin  100 mg Oral TID   heparin  5,000 Units Subcutaneous Q8H   hydrALAZINE  10 mg Oral Q8H   insulin aspart  0-15 Units Subcutaneous TID WC   insulin aspart  0-5 Units Subcutaneous QHS   insulin aspart  4 Units Subcutaneous TID WC   insulin glargine  10 Units Subcutaneous BID   isosorbide mononitrate  30 mg Oral Daily   metoCLOPramide (REGLAN) injection  5 mg Intravenous Q6H   multivitamin with minerals  1 tablet Oral Daily   nutrition supplement (JUVEN)  1 packet Oral BID BM   pantoprazole  40 mg Oral BID   povidone-iodine  2 application Topical Once   sacubitril-valsartan  1 tablet Oral BID   senna  1 tablet Oral BID   sodium chloride flush  10-40 mL Intracatheter Q12H   spironolactone  12.5 mg Oral Daily   torsemide  40 mg Oral Daily   zinc sulfate  220 mg Oral Daily    Infusions:  lactated ringers 10 mL/hr at 01/21/21 0946   magnesium sulfate bolus IVPB     penicillin g continuous IV infusion 12 Million Units (01/25/21 0202)    PRN Medications: acetaminophen, albuterol, alum & mag hydroxide-simeth, baclofen, bisacodyl, guaiFENesin-dextromethorphan, hydrALAZINE, HYDROcodone-acetaminophen, HYDROcodone-acetaminophen, ipratropium-albuterol, labetalol, magnesium  sulfate bolus IVPB, metoprolol tartrate, morphine injection, ondansetron, phenol, polyethylene glycol, potassium chloride, sodium chloride flush   Assessment/Plan   1. Acute on chronic systolic CHF:   -Primarily nonischemic cardiomyopathy.  Echo in 2017 with EF 15-20%.  Medtronic ICD 2018. LHC/RHC 11/06/17 showed volume overload with 80% RCA stenosis.  Cardiac index low at 1.91. The degree of coronary disease does not explain his cardiomyopathy.  PYP scan in 7/21 not suggestive of transthyretin amyloidosis. Admitted 07/23 with acute on chronic CHF in setting of acute on chronic respiratory faliure with hypoxia/possible COPD exacerbation and group G strep bacteremia.   -Echo this admission with with EF 20-25%. TEE on 7/28: EF 15 to 20 percent, severely dilated LV with normal wall thickness and diffuse hypokinesis; moderately dilated Rv with moderate systolic dysfunction. No evidence of endocarditis  -Co-ox stable at 57%.  Creatinine f stable 1.4   -CVP difficult to assess due to hiccups.  - Weight down 17 lb overnight? Appears compensated.  -Continue  torsemide to 40 mg daily.  -Continue hydralazine 10 mg TID/Imdur 30 mg daily.  -Continue bisoprolol 2.5 mg daily -Continue Jardiance 10 mg daily -Continue spiro 12.5 mg daily. -Continue entresto 24/26 mg BID - Renal function stable.  -Not adherent with medications PTA. Followed by paramedicine in the community. Has not kept most recent clinic f/u. Did not return calls to reschedule home visits this past month.   2. Acute on chronic hypoxic respiratory failure:  -Initially requiring BiPAP.Likely d/t combination of COPD exacerbation and acute on chronic CHF.  - Stable on  nasal cannula.  -Has home 02 but admits he does not use it regularly  3. COPD with possible acute exacerbation:  -Never smoked, but apparently had significant occupational exposure.  It appears that he won a lawsuit dealing with the occupational exposure-related COPD. CPX in 2/21  showed severe functional limitation, but it appeared to be due to pulmonary abnormalities rather than HF.  He is on home oxygen.  -Treated for COPD exacerbation per triad  4. Group G strep bacteremia:  -Leg wounds suspected source. On PCN G through 8/5.  No endocarditis on TEE. 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 Also has RLE wounds. Needs right sided ABI => normal. - Per ID, if no improvement in leukocytosis, may need imaging to evaluate for an occult abscess from seeding of group G strep bacteremia elsewhere. WBC trending down but remains elevated. -CT abdomen pelvis w/o contrast ordered by primary team.Results pending.   5. CAD:   -LHC in 5/19 with 80-90% proximal RCA stenosis treated with DES.  This did not cause his cardiomyopathy, has a large RCA.   - No chest pain.  -Continue aspirin and statin  6. Polycythemia:  -Likely related to chronic hypoxia.  7. OSA:  -Cannot tolerate CPAP.  Has oxygen to wear at night, but not adherent in community  8. DM II, uncontrolled:  -A1c 10.3 -He is on insulin.   -Continue Jardiance  9. Hypertriglyceridemia:  -Off Vascepa due to blood in stool that resolved. -Continue fenofibrate 145 mg daily.   10. AKI on CKD:  -Has stage IIIa CKD at baseline. Baseline creatinine 1.5 - Stable 1.4   SDOH: -Not adherent with follow-ups or home care visits. Enrolled in Paramedicine in the community. Will need close f/u at discharge.  -Reports access to medications and transportation. Not taking medicines as prescribed PTA. -TOC consult.  Length of Stay: 10  Tonye Becket, NP  01/25/2021, 9:37 AM  Advanced Heart Failure Team Pager [phone] (M-F; 7a - 5p)  Please contact [facility] for night-coverage after hours (5p -7a ) and weekends on [email or web address]   Patient seen with NP, agree with the above note.   Stable from cardiac perspective, volume ok.  No med changes.  OK for CIR when ready from infectious standpoint.   Marca Ancona 01/25/2021

## 2021-01-25 NOTE — Consult Note (Addendum)
NAMEBulmaro [name], MRN:  [id number], DOB:  1961-03-13, LOS: 10 ADMISSION DATE:  01/15/2021, CONSULTATION DATE:  01/25/2021  REFERRING MD:  Sunnie Nielsen, CHIEF COMPLAINT:  lung mass   History of present illness   Richard [name] is a 60 y.o. male with a PMH of CAD s/p PCI/DES to RCA in 2019, chronic combined CHF/ NICM s/p ICD, HTN, HLD, T2DM, venous stasis ulcer, COPD on home O2, pulmonary hamartoma and CKD stage 3A who presented with worsening shortness of breath and body aches and admitted for sepsis secondary to group G Streptococcus bacteremia from L heel ulcer and acute on chronic respiratory failure secondary to heart failure and COPD exacerbation. Patient now status post BKA for gangrenous, necrotic ulcer left heel.  A CT scan was obtained on 8/2 which showed left lower lobe pulmonary mass measuring 3.5 cm x 3.3 cm.  Pulmonology was consulted to evaluate lung mass.  Past Medical History  He,  has a past medical history of AICD (automatic cardioverter/defibrillator) present (01/11/2017), Anxiety, CHF (congestive heart failure) (HCC), CKD (chronic kidney disease), COPD (chronic obstructive pulmonary disease) (HCC), Diabetes mellitus with diabetic neuropathy, with long-term current use of insulin (HCC), GERD (gastroesophageal reflux disease), High cholesterol, History of gout, Hypertension, Nonobstructive atherosclerosis of coronary artery, On home oxygen therapy, 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HCC), and Systolic HF (heart failure) (HCC).   Consults:  Cardiology Infectious Disease Pulmonalogy  Procedures:  TEE 7/29: Left BKA  Significant Diagnostic Tests:  7/28 TEE: EF 15-20%, diffuse hypokinesis, no LA appendage thrombus. No vegetation on valve/icd 7/28 CT left ankle suggest necrotizing process 8/02: CT abd/pel : Well defined left lower lobe pulmonary mass measures 3.5 x 3.3 cm. This lesion has been present since at least 11/24/2015 when it measured 2.5 x 2.4 cm.  Micro Data:  7/23 --> Bcx group g  strep 1 of 2 set 7/24 --> Bcx negative to date  Antimicrobials:  Ceftriazone 7/23-->7/26 Vancomycin 7/23-->7/26  Azithromycin 7/23-->7/27 Penicillin G 7/26-->  Objective   Blood pressure 102/76, pulse 73, temperature 98.9 F (37.2 C), temperature source Oral, resp. rate 18, height 6\' 5"  (1.956 m), weight 114 kg, SpO2 100 %. CVP:  [2 mmHg-9 mmHg] 9 mmHg      Intake/Output Summary (Last 24 hours) at 01/25/2021 1459 Last data filed at 01/25/2021 1305 Gross per 24 hour  Intake 1873.86 ml  Output 2600 ml  Net -726.14 ml   Filed Weights   01/24/21 0413 01/25/21 0016 01/25/21 0500  Weight: 114 kg 114 kg 114 kg    Examination: General: Pleasant, well-appearing middle-age man laying in bed. No acute distress. Head: Normocephalic. Atraumatic. CV: RRR. No murmurs, rubs, or gallops. No LE edema Pulmonary: Lungs CTAB. Normal effort.  Decreased air movement.  No wheezing or rales. Abdominal: Soft, nontender.  Mildly distended.  Normal bowel sounds. Extremities: Left BKA.  Skin: Warm and dry. No obvious rash or lesions. Neuro: A&Ox3. Moves all extremities. Normal sensation. No focal deficit. Psych: Normal mood and affect   Resolved Hospital Problem list     Assessment & Plan:  LLL Lung mass  CT abdomen pelvis today showed well defined left lower lobe pulmonary mass measures 3.5 x 3.3 cm.  This is not a new finding.  Patient has had this mass since 2013 when it measured 1.5 x 1.5 cm.  It was biopsied in November 2013 and pathology results was consistent with hamartoma.  Mass has been slowly growing since 2013 and patient has previously declined referral to thoracic surgery.  Furthermore he's a poor surgical candidate given significant CV co-morbidities.   --No urgent intervention needed during this hospitalization.  Patient not interested in any intervention during this hospitalization. --Patient will need follow-up with pulmonologist at [facility] pulmonology --Continue  COPD and heart failure management  We will sign off.   Patient seen and examined with Dr. Kirke Corin. Agree with his findings and this note reflects my independent evaluation, impression and plan.   Durel Salts, MD Pulmonary and Critical Care Medicine [facility]   CBC: Recent Labs  Lab 01/20/21 [id number] 01/21/21 [phone] 01/22/21 0334 01/23/21 0622 01/24/21 0539 01/25/21 0559  WBC 34.1* 30.7* 16.9* 19.5* 18.0* 18.8*  NEUTROABS 29.9* 26.4* 12.8* 15.8* 14.1*  --   HGB 16.1 14.9 14.2 14.8 14.3 14.9  HCT 49.1 45.4 43.7 45.7 44.7 47.3  MCV 87.7 87.1 88.5 87.5 89.0 89.9  PLT 255 313 336 366 362 410*    Basic Metabolic Panel: Recent Labs  Lab 01/21/21 0446 01/22/21 0334 01/23/21 0622 01/24/21 0539 01/25/21 0559  NA 131* 131* 132* 135 134*  K 4.7 4.7 4.8 4.5 4.9  CL 92* 94* 92* 92* 91*  CO2 32 30 33* 36* 35*  GLUCOSE 160* 186* 119* 90 76  BUN 57* 49* 43* 39* 36*  CREATININE 1.45* 1.37* 1.27* 1.21 1.42*  CALCIUM 8.9 8.3* 8.7* 8.4* 8.6*   GFR: Estimated Creatinine Clearance: 79.5 mL/min (A) (by C-G formula based on SCr of 1.42 mg/dL (H)). Recent Labs  Lab 01/22/21 0334 01/23/21 0622 01/24/21 0539 01/25/21 0559  WBC 16.9* 19.5* 18.0* 18.8*    Liver Function Tests: Recent Labs  Lab 01/19/21 0500 01/20/21 0357 01/21/21 0332  AST 26 21 24   ALT 25 22 24   ALKPHOS 70 65 83  BILITOT 1.4* 1.4* 0.7  PROT 8.1 7.9 7.9  ALBUMIN 2.2* 2.1* 2.0*   No results for input(s): LIPASE, AMYLASE in the last 168 hours. No results for input(s): AMMONIA in the last 168 hours.  ABG    Component Value Date/Time   PHART 7.292 (L) 01/16/2021 0110   PCO2ART 57.5 (H) 01/16/2021 0110   PO2ART 113 (H) 01/16/2021 0110   HCO3 26.9 01/16/2021 0110   TCO2 31 01/15/2021 2045   O2SAT 57.0 01/25/2021 0559     Coagulation Profile: No results for input(s): INR, PROTIME in the last 168 hours.  Cardiac Enzymes: No results for input(s): CKTOTAL, CKMB, CKMBINDEX, TROPONINI in  the last 168 hours.  HbA1C: Hgb A1c MFr Bld  Date/Time Value Ref Range Status  01/17/2021 05:38 AM 10.3 (H) 4.8 - 5.6 % Final    Comment:    (NOTE)         Prediabetes: 5.7 - 6.4         Diabetes: >6.4         Glycemic control for adults with diabetes: <7.0   10/31/2017 04:20 PM 10.4 (H) 4.8 - 5.6 % Final    Comment:    (NOTE) Pre diabetes:          5.7%-6.4% Diabetes:              >6.4% Glycemic control for   <7.0% adults with diabetes     CBG: Recent Labs  Lab 01/24/21 1307 01/24/21 1558 01/24/21 2117 01/25/21 0626 01/25/21 1124  GLUCAP 151* 165* 110* 95 108*    Review of Systems:   As stated in HPI  Surgical History    Past Surgical History:  Procedure Laterality Date  AMPUTATION Left 01/21/2021   Procedure: AMPUTATION BELOW KNEE;  Surgeon: Nadara Mustard, MD;  Location: [facility] OR;  Service: Orthopedics;  Laterality: Left;   APPLICATION OF WOUND VAC Left 01/21/2021   Procedure: APPLICATION OF WOUND VAC;  Surgeon: Nadara Mustard, MD;  Location: [facility] OR;  Service: Orthopedics;  Laterality: Left;   CATARACT EXTRACTION W/ INTRAOCULAR LENS IMPLANTW/ TRABECULECTOMY Left ~ 2013   "may have done this when I had my other eye OR"   CIRCUMCISION     CORONARY STENT INTERVENTION N/A 11/02/2017   Procedure: CORONARY STENT INTERVENTION;  Surgeon: Laurey Morale, MD;  Location: [facility] INVASIVE CV LAB;  Service: Cardiovascular;  Laterality: N/A;   CORONARY STENT INTERVENTION N/A 11/02/2017   Procedure: CORONARY STENT INTERVENTION;  Surgeon: Lyn Records, MD;  Location: [facility] INVASIVE CV LAB;  Service: Cardiovascular;  Laterality: N/A;   EYE SURGERY Left ~ 2013   "fell on something in basement; stuck in my eye"   ICD IMPLANT     Medtronic Dual Chamber 01/11/17   MULTIPLE TOOTH EXTRACTIONS     "pulled all my top teeth"   RIGHT/LEFT HEART CATH AND CORONARY ANGIOGRAPHY N/A 11/02/2017   Procedure: RIGHT/LEFT HEART CATH AND CORONARY ANGIOGRAPHY;  Surgeon: Laurey Morale, MD;  Location: [facility]  INVASIVE CV LAB;  Service: Cardiovascular;  Laterality: N/A;   TEE WITHOUT CARDIOVERSION N/A 01/20/2021   Procedure: TRANSESOPHAGEAL ECHOCARDIOGRAM (TEE);  Surgeon: Laurey Morale, MD;  Location[facility] ENDOSCOPY;  Service: Cardiovascular;  Laterality: N/A;     Social History   reports that he has quit smoking. His smoking use included cigarettes. He has never used smokeless tobacco. He reports previous alcohol use. He reports that he does not use drugs.   Family History   His family history includes Diabetes in his brother, father, mother, and sister; Hypertension in his father and mother.   Allergies Allergies  Allergen Reactions   Aspirin Itching   Codeine Hives   Coconut Oil Itching     Home Medications  Prior to Admission medications   Medication Sig Start Date End Date Taking? Authorizing Provider  acetaminophen (TYLENOL) 650 MG CR tablet Take 650 mg by mouth every 8 (eight) hours as needed for pain.   Yes [provider]  albuterol (VENTOLIN HFA) 108 (90 Base) MCG/ACT inhaler Inhale 1-2 puffs into the lungs every 4 (four) hours as needed for wheezing or shortness of breath. 10/17/20  Yes Alessandra Bevels, MD  allopurinol (ZYLOPRIM) 100 MG tablet Take 1 tablet (100 mg total) by mouth daily. 12/26/17  Yes Clegg, Amy D, NP  aspirin 81 MG chewable tablet Chew 81 mg by mouth daily.   Yes [provider]  atorvastatin (LIPITOR) 40 MG tablet TAKE ONE TABLET BY MOUTH DAILY AT 6PM Patient taking differently: Take 40 mg by mouth daily. 11/15/20  Yes Laurey Morale, MD  bisoprolol (ZEBETA) 5 MG tablet Take 2.5 mg by mouth daily.   Yes [provider]  BREO ELLIPTA 100-25 MCG/INH AEPB Inhale 1 puff into the lungs daily. 01/03/21  Yes [provider]  busPIRone (BUSPAR) 5 MG tablet Take 5 mg by mouth 2 (two) times daily.   Yes [provider]  docusate sodium (COLACE) 100 MG capsule Take 100 mg by mouth daily as needed for mild constipation.   Yes  [provider]  DULoxetine (CYMBALTA) 60 MG capsule Take 60 mg by mouth daily. 09/28/17  Yes [provider]  eplerenone (INSPRA) 50 MG tablet Take  1 tablet (50 mg total) by mouth daily. 07/27/20  Yes Laurey Morale, MD  fenofibrate (TRICOR) 145 MG tablet TAKE ONE TABLET BY MOUTH DAILY Patient taking differently: Take 145 mg by mouth daily. 12/15/20  Yes Laurey Morale, MD  hydrALAZINE (APRESOLINE) 50 MG tablet Take 1.5 tablets (75 mg total) by mouth 3 (three) times daily. 09/09/20  Yes Laurey Morale, MD  insulin regular human CONCENTRATED (HUMULIN R) 500 UNIT/ML kwikpen Inject 0-400 Units into the skin as directed. Sliding 07/18/17  Yes [provider]  JARDIANCE 25 MG TABS tablet Take 25 mg by mouth daily. 10/15/19  Yes Laurey Morale, MD  MAGNESIUM-OXIDE 400 (241.3 Mg) MG tablet Take 1 tablet (400 mg total) by mouth daily. 08/27/20  Yes Laurey Morale, MD  pregabalin (LYRICA) 150 MG capsule Take 150 mg by mouth 3 (three) times daily. 10/07/20  Yes [provider]  sacubitril-valsartan (ENTRESTO) 97-103 MG Take 1 tablet by mouth 2 (two) times daily. 03/17/20  Yes Laurey Morale, MD  SSD 1 % cream Apply 1 application topically daily as needed for wound care. 12/20/20  Yes [provider]  Torsemide 40 MG TABS Take 80 mg by mouth in the morning AND 60 mg every evening. 12/07/20  Yes Laurey Morale, MD  digoxin (LANOXIN) 0.125 MG tablet TAKE ONE TABLET BY MOUTH DAILY Must keep further appointments for refills 01/17/21   Laurey Morale, MD  icosapent Ethyl (VASCEPA) 1 g capsule Take 2 capsules (2 g total) by mouth 2 (two) times daily. Patient not taking: No sig reported 10/08/20   Laurey Morale, MD  Insulin Pen Needle (NOVOFINE) 30G X 8 MM MISC Inject 10 each into the skin as needed. 11/06/17   Arnetha Courser, MD  isosorbide dinitrate (ISORDIL) 20 MG tablet TAKE TWO TABLETS BY MOUTH THREE TIMES A DAY 12/15/20   Laurey Morale, MD  omeprazole (PRILOSEC)  20 MG capsule TAKE ONE CAPSULE BY MOUTH DAILY Patient not taking: No sig reported 10/20/20   Laurey Morale, MD     Steffanie Rainwater [place] [facility] 01/25/2021 2:59 PM  Pager: see amion  If no response to pager , please call critical care on call (see AMION) until 7pm After 7:00 pm call Elink

## 2021-01-25 NOTE — Progress Notes (Signed)
Physical Therapy Treatment Patient Details Name: Richard [name] MRN: [id number] DOB: 09-03-1960 Today's Date: 01/25/2021    History of Present Illness Pt is a 60 y.o. male admitted 01/15/21 with SOB, body aches. Workup for CHF vs COPD exacerbation. Pt also with chronic BLE wounds followed by wound care. 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 Of note, recent admission 09/2020 with CHF exacerbation. PMH includes DM2, COPD (on 2L O2 baseline), CHF, AICD, CKD, HTN.   PT Comments    Pt progressing well with mobility. Today's session focused on seated activity and transfer training; pt requires modA+1 to stand pivot with RW, unable to fully offload RLE in order to hop yet. Pt remains limited by pain, generalized weakness, impaired balance strategies and decreased activity tolerance; at high risk for falls. Pt motivated to participate and regain PLOF; remains an excellent candidate for intensive CIR-level therapies to maximize functional mobility and independence prior to return home.    Follow Up Recommendations  CIR;Supervision for mobility/OOB     Equipment Recommendations  Rolling walker with 5" wheels;Wheelchair (measurements PT);3in1 (PT)    Recommendations for Other Services       Precautions / Restrictions Precautions Precautions: Fall;Other (comment) Precaution Comments: LLE wound vac Other Brace: L BKA shrinker and limb protector Restrictions Weight Bearing Restrictions: Yes LLE Weight Bearing: Non weight bearing    Mobility  Bed Mobility Overal bed mobility: Needs Assistance             General bed mobility comments: Received sitting EOB (NT had assisted pt to EOB prior to session)    Transfers Overall transfer level: Needs assistance Equipment used: Rolling walker (2 wheeled) Transfers: Sit to/from [employer] Sit to Stand: Mod assist Stand pivot transfers: Mod assist       General transfer comment: Verbal cues for BUE/BLE placement and sequencing prior to standing,  reliant on momentum and modA for trunk elevation from slightly elevated bed height; minA for eccentric control lowering to sit; heavy reliance on UE support. Pt unable to fully offload RLE in order to hop with RW, able to scoot R foot in order to pivot towards recliner with RW and modA to maintain balance; external assist for RW management, frequent verbal cues for safety/sequencing  Ambulation/Gait             General Gait Details: Unable to fully offload RLE while using RW in order to hop   Stairs             Wheelchair Mobility    Modified Rankin (Stroke Patients Only)       Balance Overall balance assessment: Needs assistance Sitting-balance support: No upper extremity supported;Feet supported Sitting balance-Leahy Scale: Good       Standing balance-Leahy Scale: Poor Standing balance comment: Reliant on BUE support and external assist                            Cognition Arousal/Alertness: Awake/alert Behavior During Therapy: Flat affect Overall Cognitive Status: No family/caregiver present to determine baseline cognitive functioning Area of Impairment: Attention;Following commands;Safety/judgement;Awareness;Problem solving                   Current Attention Level: Selective   Following Commands: Follows one step commands with increased time Safety/Judgement: Decreased awareness of safety;Decreased awareness of deficits Awareness: Emergent Problem Solving: Slow processing;Difficulty sequencing;Requires verbal cues;Requires tactile cues General Comments: Pleasant and agreeable to participate. Noted pt easily distracted requiring cues to attend to current  conversation and complete task; decreased awareness and insight into deficits; cues for problem solving      Exercises Amputee Exercises Knee Flexion: AROM;Left;Seated Knee Extension: AROM;AAROM;Left;Seated Straight Leg Raises: AROM;Left;Seated    General Comments General comments  (skin integrity, edema, etc.): SpO2 100% on RA. Removed LLE limb guard and shrinker for skin check and repositioning of wound vac line (to decrease risk for skin breakdown); educ pt on importance of keeping L knee limb guard on with removal at least 1x/day for skin check; importance of resting with L knee in extension; other educ re: phantom limb pain      Pertinent Vitals/Pain Pain Assessment: Faces Faces Pain Scale: Hurts little more Pain Location: L residual limb Pain Descriptors / Indicators: Grimacing;Sore Pain Intervention(s): Monitored during session;RN gave pain meds during session    Home Living                      Prior Function            PT Goals (current goals can now be found in the care plan section) Progress towards PT goals: Progressing toward goals    Frequency    Min 3X/week      PT Plan Current plan remains appropriate    Co-evaluation              AM-PAC PT "6 Clicks" Mobility   Outcome Measure  Help needed turning from your back to your side while in a flat bed without using bedrails?: A Little Help needed moving from lying on your back to sitting on the side of a flat bed without using bedrails?: A Little Help needed moving to and from a bed to a chair (including a wheelchair)?: A Lot Help needed standing up from a chair using your arms (e.g., wheelchair or bedside chair)?: A Lot Help needed to walk in hospital room?: Total Help needed climbing 3-5 steps with a railing? : Total 6 Click Score: 12    End of Session Equipment Utilized During Treatment: Gait belt Activity Tolerance: Patient tolerated treatment well Patient left: in chair;with call bell/phone within reach;with chair alarm set Nurse Communication: Mobility status PT Visit Diagnosis: Unsteadiness on feet (R26.81);Other abnormalities of gait and mobility (R26.89);Muscle weakness (generalized) (M62.81);Pain     Time: 5397-6734 PT Time Calculation (min) (ACUTE ONLY): 35  min  Charges:  $Therapeutic Activity: 23-37 mins                     Ina Homes, PT, DPT Acute Rehabilitation Services  Pager [phone] Office [phone]  Malachy Chamber 01/25/2021, 2:13 PM

## 2021-01-25 NOTE — Progress Notes (Signed)
Nutrition Follow-up  DOCUMENTATION CODES:   Obesity unspecified  INTERVENTION:   -Continue 1 packet Juven BID, each packet provides 95 calories, 2.5 grams of protein (collagen), and 9.8 grams of carbohydrate (3 grams sugar); also contains 7 grams of L-arginine and L-glutamine, 300 mg vitamin C, 15 mg vitamin E, 1.2 mcg vitamin B-12, 9.5 mg zinc, 200 mg calcium, and 1.5 g  Calcium Beta-hydroxy-Beta-methylbutyrate to support wound healing  -D/c Glucerna -D/c Ensure Max -Continue MVI with minerals daily -Magic cup TID with meals, each supplement provides 290 kcal and 9 grams of protein  -Double protein portions with meals  NUTRITION DIAGNOSIS:   Increased nutrient needs related to wound healing as evidenced by estimated needs.  Ongoing  GOAL:   Patient will meet greater than or equal to 90% of their needs  Progressing   MONITOR:   PO intake, Supplement acceptance, Labs, Weight trends, Skin, I & O's  REASON FOR ASSESSMENT:   Consult Assessment of nutrition requirement/status  ASSESSMENT:   Richard [name] is a 60 y.o. male with medical history significant of DM2, COPD on chronic O2 2 L, systolic CHF sp AICD, venous stasis ulcer, HTN, CKD, pulmonary nodule (hamartoma) presented with increased SOB, no fever, no CP. Noted to have  Sp02 78% on RA with EMS. 4L [place] applied EMS gave 1 Neb, 125mg  Solumedrol IV and 4mg  Zofran IV. Pt remained on 4 L despite repeated neb treatments. Hx of severe COPD on O2 former smoker. Pt was hospitalized at [facility] in April of this year for CHF exacerbation requiring aggressive diuresis. Chronic leg wounds followed by wound care.  PCCM was consulted for worsening hypoxia and patient being on BiPAP.  Patient admitted for further management.  7/25- PICC placed 7/28- s/p TEE- negative for endocarditis 7/29- s/p lt BKA with wound vac placement  Reviewed I/O's: -2.1 L x 24 hours and -18.9 L since admission  UOP: 3.4 L x 24 hours  Drain output: 75 ml x  24 hours  Spoke with pt, who reports feeling better today. He reports good appetite, however, did not feel like eating much breakfast- per pt, he ordered pancakes, but did not want them and ate some fruit instead. Documented meal completions 25-100%. He has been refusing nutritional supplements. Per pt, "it's just me". Discussed importance of good meal and supplement intake to promote healing.   Per chart review, potential plan to d/c to to CIR once medically stable.   Medications reviewed and include vitamin C, colace, reglan, senokot, demadex, and zinc sulfate.  Labs reviewed: Na: 134, CBGS: 95-165 (inpatient orders for glycemic control are 10 mg empagliflozin daily, 0-5 units insulin aspart daily at bedtime, 4 units insulin apsart TID with meals, and 10 units insulin glargine BID).    Diet Order:   Diet Order             Diet Carb Modified Fluid consistency: Thin; Room service appropriate? Yes  Diet effective now                   EDUCATION NEEDS:   Education needs have been addressed  Skin:  Skin Assessment: Skin Integrity Issues: Skin Integrity Issues:: Wound VAC Wound Vac: s/p lt BKA Other: -  Last BM:  01/25/21  Height:   Ht Readings from Last 1 Encounters:  01/16/21 6\' 5"  (1.956 m)    Weight:   Wt Readings from Last 1 Encounters:  01/25/21 114 kg    Ideal Body Weight:  88.4 kg (adjusted for  lt BKA)  BMI:  Body mass index is 29.8 kg/m.  Estimated Nutritional Needs:   Kcal:  3005-1102  Protein:  140-155 grams  Fluid:  > 2 L    Levada Schilling, RD, LDN, CDCES Registered Dietitian II Certified Diabetes Care and Education Specialist Please refer to [facility] for RD and/or RD on-call/weekend/after hours pager

## 2021-01-26 ENCOUNTER — Encounter (HOSPITAL_COMMUNITY): Payer: Self-pay | Admitting: Internal Medicine

## 2021-01-26 LAB — CBC WITH DIFFERENTIAL/PLATELET
Abs Immature Granulocytes: 0.2 10*3/uL — ABNORMAL HIGH (ref 0.00–0.07)
Basophils Absolute: 0 10*3/uL (ref 0.0–0.1)
Basophils Relative: 0 %
Eosinophils Absolute: 0 10*3/uL (ref 0.0–0.5)
Eosinophils Relative: 0 %
HCT: 42.7 % (ref 39.0–52.0)
Hemoglobin: 13.6 g/dL (ref 13.0–17.0)
Immature Granulocytes: 1 %
Lymphocytes Relative: 8 %
Lymphs Abs: 1.4 10*3/uL (ref 0.7–4.0)
MCH: 28.3 pg (ref 26.0–34.0)
MCHC: 31.9 g/dL (ref 30.0–36.0)
MCV: 89 fL (ref 80.0–100.0)
Monocytes Absolute: 1.7 10*3/uL — ABNORMAL HIGH (ref 0.1–1.0)
Monocytes Relative: 9 %
Neutro Abs: 15.1 10*3/uL — ABNORMAL HIGH (ref 1.7–7.7)
Neutrophils Relative %: 82 %
Platelets: 370 10*3/uL (ref 150–400)
RBC: 4.8 MIL/uL (ref 4.22–5.81)
RDW: 18 % — ABNORMAL HIGH (ref 11.5–15.5)
WBC: 18.6 10*3/uL — ABNORMAL HIGH (ref 4.0–10.5)
nRBC: 0 % (ref 0.0–0.2)

## 2021-01-26 LAB — SURGICAL PATHOLOGY

## 2021-01-26 LAB — BASIC METABOLIC PANEL
Anion gap: 7 (ref 5–15)
BUN: 33 mg/dL — ABNORMAL HIGH (ref 6–20)
CO2: 34 mmol/L — ABNORMAL HIGH (ref 22–32)
Calcium: 8.4 mg/dL — ABNORMAL LOW (ref 8.9–10.3)
Chloride: 89 mmol/L — ABNORMAL LOW (ref 98–111)
Creatinine, Ser: 1.16 mg/dL (ref 0.61–1.24)
GFR, Estimated: >60 mL/min (ref >60)
Glucose, Bld: 110 mg/dL — ABNORMAL HIGH (ref 70–99)
Potassium: 4.6 mmol/L (ref 3.5–5.1)
Sodium: 130 mmol/L — ABNORMAL LOW (ref 135–145)

## 2021-01-26 LAB — GLUCOSE, CAPILLARY
Glucose-Capillary: 123 mg/dL — ABNORMAL HIGH (ref 70–99)
Glucose-Capillary: 144 mg/dL — ABNORMAL HIGH (ref 70–99)
Glucose-Capillary: 147 mg/dL — ABNORMAL HIGH (ref 70–99)
Glucose-Capillary: 185 mg/dL — ABNORMAL HIGH (ref 70–99)

## 2021-01-26 LAB — COOXEMETRY PANEL
Carboxyhemoglobin: 1.2 % (ref 0.5–1.5)
Methemoglobin: 1.2 % (ref 0.0–1.5)
O2 Saturation: 67.1 %
Total hemoglobin: 14 g/dL (ref 12.0–16.0)

## 2021-01-26 MED ORDER — HYDROCODONE-ACETAMINOPHEN 5-325 MG PO TABS: 1.0000 | ORAL_TABLET | Freq: Four times a day (QID) | ORAL | Status: DC | PRN

## 2021-01-26 MED ORDER — SACUBITRIL-VALSARTAN 24-26 MG PO TABS: 1.0000 | ORAL_TABLET | Freq: Two times a day (BID) | ORAL | Status: DC

## 2021-01-26 MED ORDER — TORSEMIDE 40 MG PO TABS: 40.0000 mg | ORAL_TABLET | Freq: Every day | ORAL | Status: DC

## 2021-01-26 MED ORDER — ISOSORBIDE MONONITRATE ER 30 MG PO TB24: 30.0000 mg | ORAL_TABLET | Freq: Every day | ORAL | Status: DC

## 2021-01-26 MED ORDER — POLYETHYLENE GLYCOL 3350 17 G PO PACK: 17.0000 g | PACK | Freq: Every day | ORAL | 0 refills | Status: DC | PRN

## 2021-01-26 MED ORDER — GABAPENTIN 100 MG PO CAPS: 100.0000 mg | ORAL_CAPSULE | Freq: Three times a day (TID) | ORAL | Status: DC

## 2021-01-26 MED ORDER — ADULT MULTIVITAMIN W/MINERALS CH: 1.0000 | ORAL_TABLET | Freq: Every day | ORAL | Status: DC

## 2021-01-26 MED ORDER — SPIRONOLACTONE 25 MG PO TABS: 12.5000 mg | ORAL_TABLET | Freq: Every day | ORAL | Status: DC

## 2021-01-26 MED ORDER — ZINC SULFATE 220 (50 ZN) MG PO CAPS: 220.0000 mg | ORAL_CAPSULE | Freq: Every day | ORAL | Status: DC

## 2021-01-26 MED ORDER — JUVEN PO PACK: 1.0000 | PACK | Freq: Two times a day (BID) | ORAL | 0 refills | Status: DC

## 2021-01-26 MED ORDER — CEFADROXIL 1 G PO TABS: 1.0000 g | ORAL_TABLET | Freq: Two times a day (BID) | ORAL | 0 refills | Status: DC

## 2021-01-26 NOTE — Progress Notes (Signed)
Patient is 1 week status post left below-knee amputation.  Patient is sleeping CPAP is in place.  They are approximately 225 cc of bloody drainage in the wound VAC which is functioning well. From an orthopedic standpoint could transfer to rehab versus skilled nursing with 1 week follow-up in our office

## 2021-01-26 NOTE — Progress Notes (Signed)
Inpatient Rehab Admissions Coordinator:   I do not have a bed for this patient to admit to CIR today.  Will f/u tomorrow for possible admission pending bed availability.    Estill Dooms, PT, DPT Admissions Coordinator [phone] 01/26/21  3:14 PM

## 2021-01-26 NOTE — Plan of Care (Signed)
  Problem: Education: Goal: Knowledge of General Education information will improve Description: Including pain rating scale, medication(s)/side effects and non-pharmacologic comfort measures Outcome: Progressing   Problem: Health Behavior/Discharge Planning: Goal: Ability to manage health-related needs will improve Outcome: Progressing   Problem: Clinical Measurements: Goal: Respiratory complications will improve Outcome: Progressing   Problem: Coping: Goal: Level of anxiety will decrease Outcome: Progressing   Problem: Pain Managment: Goal: General experience of comfort will improve Outcome: Progressing   

## 2021-01-26 NOTE — Progress Notes (Signed)
Occupational Therapy Treatment Patient Details Name: Richard [name] MRN: [id number] DOB: 1960/11/14 Today's Date: 01/26/2021    History of present illness Pt is a 60 y.o. male admitted 01/15/21 with SOB, body aches. Workup for CHF vs COPD exacerbation. Pt also with chronic BLE wounds followed by wound care. 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 Of note, recent admission 09/2020 with CHF exacerbation. PMH includes DM2, COPD (on 2L O2 baseline), CHF, AICD, CKD, HTN.   OT comments  Pt making good progress towards OT goals today. With +2 assist able to complete first hops with min guard A +2. Also able to complete toileting (max A for peri care but able to maintain standing for cleaning) Pt's O2 WFL but Pt requiring breaths utilizing BiPAP for anxiety.  POC remains appropriate at this time.   Follow Up Recommendations  CIR    Equipment Recommendations  3 in 1 bedside commode;Wheelchair (measurements OT);Wheelchair cushion (measurements OT);Tub/shower bench    Recommendations for Other Services Rehab consult    Precautions / Restrictions Precautions Precautions: Fall;Other (comment) (wound vac, anxiety) Precaution Comments: LLE wound vac Required Braces or Orthoses: Other Brace Other Brace: L BKA shrinker and limb protector Restrictions Weight Bearing Restrictions: Yes LLE Weight Bearing: Non weight bearing       Mobility Bed Mobility Overal bed mobility: Needs Assistance Bed Mobility: Supine to Sit     Supine to sit: Min guard     General bed mobility comments: watching lines for Pt    Transfers Overall transfer level: Needs assistance Equipment used: Rolling walker (2 wheeled) (drop arm BSC) Transfers: Sit to/from [employer];Lateral/Scoot Transfers Sit to Stand: Mod assist;+2 safety/equipment Stand pivot transfers: Min assist;Min guard;+2 safety/equipment Squat pivot  transfers: Min guard;Min assist;+2 safety/equipment    Lateral/Scoot Transfers: Min assist;+2 physical assistance;From elevated surface General transfer comment: cues for safety and sequencing throughout session due to decreased cognition, bed > drop arm BSC with urgency and min guard assist for safety and cues, sit<>stand from [facility] with mod A +2 safety and assist for boost and balance, able to turn/scoot/one hop 180 degrees from [facility] to recliner. Required use of BiPap for anxiety after each transfer    Balance Overall balance assessment: Needs assistance Sitting-balance support: No upper extremity supported;Feet supported Sitting balance-Leahy Scale: Good Sitting balance - Comments: Static sitting with supervision, able to perform lateral weight shifts without LOB   Standing balance support: Bilateral upper extremity supported Standing balance-Leahy Scale: Poor Standing balance comment: Reliant on BUE support and external assist                           ADL either performed or assessed with clinical judgement   ADL Overall ADL's : Needs assistance/impaired     Grooming: Wash/dry face;Wash/dry hands;Set up;Sitting Grooming Details (indicate cue type and reason): on BSC during toileting             Lower Body Dressing: Maximal assistance;Sitting/lateral leans Lower Body Dressing Details (indicate cue type and reason): to don sock on RLE - maybe benefit from sock aide? Toilet Transfer: Min guard;Cueing for safety;Squat-pivot (lateral scoot transfer) Statistician Details (indicate cue type and reason): to the right to [facility] squat pivot/lateral scoot Toileting- Clothing Manipulation and Hygiene: Maximal assistance;Sit to/from stand;+2 for safety/equipment Toileting - Clothing Manipulation Details (indicate cue type and reason): Pt able to come to standing with  max A, then 2nd therapist provided peri care while Pt maintained standing for approx 2 min     Functional mobility  during ADLs:  (squat pivot) General ADL Comments: significant decline in ability to complete ADL tasks s/p BKA. Educated on strategies for LB ADLs via lateral leans or in bed to maximize independence and decrease fall risk (while also progressing standing balance). Educated on built up grip use for utensils during meals with pt reporting "this will work" but declined to actually attempt task     Vision   Vision Assessment?: No apparent visual deficits   Perception     Praxis      Cognition Arousal/Alertness: Awake/alert Behavior During Therapy: Flat affect Overall Cognitive Status: No family/caregiver present to determine baseline cognitive functioning Area of Impairment: Attention;Following commands;Safety/judgement;Awareness;Problem solving                   Current Attention Level: Selective   Following Commands: Follows one step commands with increased time Safety/Judgement: Decreased awareness of safety;Decreased awareness of deficits Awareness: Emergent Problem Solving: Slow processing;Difficulty sequencing;Requires verbal cues;Requires tactile cues General Comments: Pt slightly impulsive today, requiring use of Bipap (for anxiety NOT respiration) multimodal cues for sequencing, problem solving and safety, Pt making moaning noises throughout session but stating "I'm fine"        Exercises     Shoulder Instructions       General Comments VSS throughout session    Pertinent Vitals/ Pain       Pain Assessment: Faces Faces Pain Scale: Hurts little more Pain Location: L residual limb Pain Descriptors / Indicators: Grimacing;Sore Pain Intervention(s): Monitored during session;Repositioned;RN gave pain meds during session  Home Living                                          Prior Functioning/Environment              Frequency  Min 2X/week        Progress Toward Goals  OT Goals(current goals can now be found in the care plan  section)  Progress towards OT goals: Progressing toward goals  Acute Rehab OT Goals Patient Stated Goal: to get better OT Goal Formulation: With patient Time For Goal Achievement: 02/05/21 Potential to Achieve Goals: Good ADL Goals Pt Will Perform Grooming: with modified independence;standing Pt Will Perform Lower Body Dressing: with modified independence;sitting/lateral leans;sit to/from stand Pt Will Transfer to Toilet: with modified independence;squat pivot transfer;bedside commode Pt Will Perform Toileting - Clothing Manipulation and hygiene: with set-up;sitting/lateral leans Additional ADL Goal #1: Pt will demonstrate 2 desensitization techniques to reduce phantom pain sensations  Plan Discharge plan remains appropriate    Co-evaluation    PT/OT/SLP Co-Evaluation/Treatment: Yes Reason for Co-Treatment: To address functional/ADL transfers;For patient/therapist safety PT goals addressed during session: Mobility/safety with mobility;Balance;Proper use of DME;Strengthening/ROM OT goals addressed during session: ADL's and self-care;Proper use of Adaptive equipment and DME;Strengthening/ROM      AM-PAC OT "6 Clicks" Daily Activity     Outcome Measure   Help from another person eating meals?: A Little Help from another person taking care of personal grooming?: A Little Help from another person toileting, which includes using toliet, bedpan, or urinal?: A Lot Help from another person bathing (including washing, rinsing, drying)?: A Lot Help from another person to put on and taking off regular upper body clothing?: A Little Help from another  person to put on and taking off regular lower body clothing?: A Lot 6 Click Score: 15    End of Session Equipment Utilized During Treatment: Rolling walker;Gait belt;Other (comment) (residual limb guard)  OT Visit Diagnosis: Unsteadiness on feet (R26.81);Other abnormalities of gait and mobility (R26.89);Muscle weakness (generalized)  (M62.81);Pain Pain - Right/Left: Left Pain - part of body: Leg   Activity Tolerance Patient tolerated treatment well   Patient Left with call bell/phone within reach;in chair;with chair alarm set   Nurse Communication Mobility status;Other (comment) (removed BSC from room as it was broken, tagged and called about it; Pt haf BM and emptied condom cath bag (recorded urine output))        Time: 1127-1206 OT Time Calculation (min): 39 min  Charges: OT General Charges $OT Visit: 1 Visit OT Treatments $Self Care/Home Management : 8-22 mins $Therapeutic Activity: 8-22 mins  Nyoka Cowden OTR/L Acute Rehabilitation Services Pager: [phone] Office: [phone]   Richard [name] 01/26/2021, 1:43 PM

## 2021-01-26 NOTE — Progress Notes (Signed)
Physical Therapy Treatment Patient Details Name: Richard [name] MRN: [id number] DOB: 12/31/1960 Today's Date: 01/26/2021    History of Present Illness Pt is a 60 y.o. male admitted 01/15/21 with SOB, body aches. Workup for CHF vs COPD exacerbation. Pt also with chronic BLE wounds followed by wound care. 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 Of note, recent admission 09/2020 with CHF exacerbation. PMH includes DM2, COPD (on 2L O2 baseline), CHF, AICD, CKD, HTN.   PT Comments    Pt progressing with mobility. Today's session focused on transfer training and gait progression. Pt able to take first few hops with RW and modA for stability (+2 safety); pt fatiguing easily with this, reverting to scooting pivots on RLE in order to complete standing transfer. Pt dependent for standing ADLs secondary to heavy reliance on BUE support to maintain balance. Of note, pt with increased DOE and secretions this session, insisting on use of bipap for "recovery breaths" despite encouragement to try without; noted that pt's vitals stable on RA. Continue to recommend intensive CIR-level therapies to maximize functional mobility and independence prior to return home.    Follow Up Recommendations  CIR;Supervision for mobility/OOB     Equipment Recommendations  Rolling walker with 5" wheels;Wheelchair (measurements PT);3in1 (PT)    Recommendations for Other Services Rehab consult     Precautions / Restrictions Precautions Precautions: Fall;Other (comment) Precaution Comments: LLE wound vac Required Braces or Orthoses: Other Brace Other Brace: L BKA shrinker and limb protector Restrictions Weight Bearing Restrictions: Yes LLE Weight Bearing: Non weight bearing    Mobility  Bed Mobility Overal bed mobility: Needs Assistance Bed Mobility: Supine to Sit     Supine to sit: Min guard;HOB elevated     General bed mobility  comments: Increased time and effort, heavy use of bed rail; assist for lines    Transfers Overall transfer level: Needs assistance Equipment used: None;Rolling walker (2 wheeled) Transfers: Lateral/Scoot Transfers;Sit to/from Stand Sit to Stand: Mod assist;+2 safety/equipment Stand pivot transfers: Min assist;Min guard;+2 safety/equipment Squat pivot transfers: Min assist;+2 safety/equipment    Lateral/Scoot Transfers: Min assist;+2 physical assistance;From elevated surface General transfer comment: Pt with urgency to have BM requiring max verbal cues for safety/lines prior to performing lateral scoot to drop-arm BSC, minA for stability (assist for set-up and lines); additional stand from [facility] with modA for trunk elevation and stability, heavy reliance on momentum and UE support to power into standing; poor eccentric control into sitting as pt quick to fatigue and attempting to sit prematurely requiring modA+2 for safe lowering; increased time to recover after each transfer, pt anxiously asking for bipap "to catch my breath"  Ambulation/Gait Ambulation/Gait assistance: Mod assist;+2 safety/equipment Gait Distance (Feet): 2 Feet Assistive device: Rolling walker (2 wheeled)       General Gait Details: Pt able to take 3-4 complete hops on RLE with RW and modA for stability; quick to fatigue reverting to scooting pivot on RLE with RW in order to complete transfer to recliner; max verbal cues for sequencing and safety as pt attempting to sit prematurely   Stairs             Wheelchair Mobility    Modified Rankin (Stroke Patients Only)       Balance Overall balance assessment: Needs assistance Sitting-balance support: No upper extremity supported;Feet supported Sitting balance-Leahy Scale: Good Sitting balance - Comments: Static sitting with supervision, able to perform lateral  weight shifts without LOB   Standing balance support: Bilateral upper extremity supported Standing  balance-Leahy Scale: Poor Standing balance comment: Reliant on BUE support and external assist; dependent for posterior pericare while standing                            Cognition Arousal/Alertness: Awake/alert Behavior During Therapy: Flat affect Overall Cognitive Status: No family/caregiver present to determine baseline cognitive functioning Area of Impairment: Attention;Following commands;Safety/judgement;Awareness;Problem solving                   Current Attention Level: Selective   Following Commands: Follows one step commands with increased time Safety/Judgement: Decreased awareness of safety;Decreased awareness of deficits Awareness: Emergent Problem Solving: Slow processing;Difficulty sequencing;Requires verbal cues;Requires tactile cues General Comments: Pt slightly impulsive today; frequently using Bipap to 'recover' (pt reports due to "losing my breath" but seems to be more related to anxiety); multimodal cues for sequencing, problem solving and safety; pt making moaning noises throughout session but stating "I'm fine"      Exercises      General Comments General comments (skin integrity, edema, etc.): VSS on RA; pt with frequent secretions requiring use of yonker, also frequently requesting use of bipap for "recovery" breaths - difficult to reason with patient about attempting to recover without bipap "breaths"      Pertinent Vitals/Pain Pain Assessment: Faces Faces Pain Scale: Hurts little more Pain Location: L residual limb Pain Descriptors / Indicators: Grimacing;Sore;Guarding Pain Intervention(s): Monitored during session;RN gave pain meds during session    Home Living                      Prior Function            PT Goals (current goals can now be found in the care plan section) Acute Rehab PT Goals Patient Stated Goal: to get better Progress towards PT goals: Progressing toward goals    Frequency    Min 3X/week       PT Plan Current plan remains appropriate    Co-evaluation PT/OT/SLP Co-Evaluation/Treatment: Yes Reason for Co-Treatment: For patient/therapist safety;To address functional/ADL transfers PT goals addressed during session: Mobility/safety with mobility;Balance;Proper use of DME;Strengthening/ROM OT goals addressed during session: ADL's and self-care;Proper use of Adaptive equipment and DME;Strengthening/ROM      AM-PAC PT "6 Clicks" Mobility   Outcome Measure  Help needed turning from your back to your side while in a flat bed without using bedrails?: A Little Help needed moving from lying on your back to sitting on the side of a flat bed without using bedrails?: A Little Help needed moving to and from a bed to a chair (including a wheelchair)?: A Lot Help needed standing up from a chair using your arms (e.g., wheelchair or bedside chair)?: A Lot Help needed to walk in hospital room?: A Lot Help needed climbing 3-5 steps with a railing? : Total 6 Click Score: 13    End of Session Equipment Utilized During Treatment: Gait belt Activity Tolerance: Patient tolerated treatment well Patient left: in chair;with call bell/phone within reach;with chair alarm set Nurse Communication: Mobility status PT Visit Diagnosis: Unsteadiness on feet (R26.81);Other abnormalities of gait and mobility (R26.89);Muscle weakness (generalized) (M62.81);Pain     Time: 1136-1206 PT Time Calculation (min) (ACUTE ONLY): 30 min  Charges:  $Therapeutic Activity: 8-22 mins  Ina Homes, PT, DPT Acute Rehabilitation Services  Pager [phone] Office [phone]  Malachy Chamber 01/26/2021, 2:10 PM

## 2021-01-26 NOTE — TOC Transition Note (Signed)
Transition of Care [facility]) - CM/SW Discharge Note   Patient Details  Name: Richard [name] MRN: [id number] Date of Birth: [birth year]  [facility] CM/SW Contact:  Leone Haven, RN Phone Number: 01/26/2021, 1:33 PM   Clinical Narrative:    Patient is for dc to CIR.    Final next level of care: IP Rehab Facility Barriers to Discharge: No Barriers Identified   Patient Goals and CMS Choice Patient states their goals for this hospitalization and ongoing recovery are:: agreeable to Home Health CMS Medicare.gov Compare Post Acute Care list provided to:: Patient Choice offered to / list presented to : Patient  Discharge Placement                       Discharge Plan and Services In-house Referral: Clinical Social Work Discharge Planning Services: CM Consult Post Acute Care Choice: Durable Medical Equipment                    HH Arranged: RN, PT [facility] Agency: [facility] Date HH Agency Contacted: 01/20/21 Time HH Agency Contacted: 1526 Representative spoke with at [facility] Agency: Jeri Modena RN-[facility], [facility]  Social Determinants of Health (SDOH) Interventions     Readmission Risk Interventions No flowsheet data found.

## 2021-01-26 NOTE — Progress Notes (Addendum)
Patient ID: Richard [name], male   DOB: [birth year], 60 y.o.   MRN: [id number]     Advanced Heart Failure Rounding Note  PCP-Cardiologist: Dr. 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  CO-OX 67%. No BMP drawn today.  ? Weights, up 9lb overnight. Is/Os not accurate.  Sleepy but easily aroused. No dyspnea or CP.   Objective:   Weight Range: 118.1 kg Body mass index is 30.87 kg/m.   Vital Signs:   Temp:  [97.4 F (36.3 C)-98.9 F (37.2 C)] 97.4 F (36.3 C) (08/03 0651) Pulse Rate:  [66-79] 66 (08/03 0437) Resp:  [16-19] 16 (08/03 0437) BP: (96-143)/(72-115) 104/82 (08/03 0437) SpO2:  [98 %-100 %] 100 % (08/03 0437) FiO2 (%):  [30 %] 30 % (08/02 2325) Weight:  [118.1 kg] 118.1 kg (08/03 0020) Last BM Date: 01/25/21  Weight change: Filed Weights   01/25/21 0016 01/25/21 0500 01/26/21 0020  Weight: 114 kg 114 kg 118.1 kg    Intake/Output:   Intake/Output Summary (Last 24 hours) at 01/26/2021 0821 Last data filed at 01/26/2021 0023 Gross per 24 hour  Intake 818.61 ml  Output 150 ml  Net 668.61 ml      Physical Exam  CVP 3 General:  Lying comfortably in bed.  No resp difficulty HEENT: normal Neck: supple. No JVP.  Carotids 2+ bilat; no bruits. No lymphadenopathy or thryomegaly appreciated. Cor: PMI nondisplaced. Regular rate & rhythm. No rubs, gallops or murmurs. Lungs: CTA anteriorly Abdomen: soft, nontender, nondistended. No hepatosplenomegaly. No bruits or masses. Good bowel sounds. Extremities: no cyanosis, clubbing, edema. L BKA. Dressing noted R great toe. Small sore right anterior shin. Neuro: alert & orientedx3, cranial nerves grossly intact. moves all 4 extremities w/o difficulty. Affect flat    Telemetry  NSR 60s (personally reviewed)  Labs    CBC Recent Labs    01/24/21 0539 01/25/21 0559  WBC 18.0* 18.8*  NEUTROABS 14.1* 14.6*  HGB 14.3 14.9  HCT 44.7 47.3  MCV  89.0 89.9  PLT 362 410*   Basic Metabolic Panel Recent Labs    72/53/66 0539 01/25/21 0559  NA 135 134*  K 4.5 4.9  CL 92* 91*  CO2 36* 35*  GLUCOSE 90 76  BUN 39* 36*  CREATININE 1.21 1.42*  CALCIUM 8.4* 8.6*   Liver Function Tests No results for input(s): AST, ALT, ALKPHOS, BILITOT, PROT, ALBUMIN in the last 72 hours. No results for input(s): LIPASE, AMYLASE in the last 72 hours. Cardiac Enzymes No results for input(s): CKTOTAL, CKMB, CKMBINDEX, TROPONINI in the last 72 hours.   BNP: BNP (last 3 results) Recent Labs    10/16/20 1810 11/09/20 1519 01/15/21 1545  BNP 329.7* 373.4* 532.8*    ProBNP (last 3 results) No results for input(s): PROBNP in the last 8760 hours.   D-Dimer No results for input(s): DDIMER in the last 72 hours. Hemoglobin A1C No results for input(s): HGBA1C in the last 72 hours.  Fasting Lipid Panel No results for input(s): CHOL, HDL, LDLCALC, TRIG, CHOLHDL, LDLDIRECT in the last 72 hours. Thyroid Function Tests No results for input(s): TSH, T4TOTAL, T3FREE, THYROIDAB in the last 72 hours.  Invalid input(s): FREET3   Other results:   Imaging    CT ABDOMEN PELVIS WO CONTRAST  Result Date: 01/25/2021 CLINICAL DATA:  Sepsis.  Hiccups. EXAM: CT ABDOMEN AND PELVIS WITHOUT CONTRAST TECHNIQUE: Multidetector CT imaging of the abdomen and pelvis  was performed following the standard protocol without IV contrast. COMPARISON:  08/10/2017 chest CT 11/24/2015 FINDINGS: Lower chest: Well defined left lower lobe pulmonary mass measures 3.5 x 3.3 cm. This lesion has been present since at least 11/24/2015 when it measured 2.5 x 2.4 cm. This was better evaluated on a recent chest CT of 12/17/2020. Hepatobiliary: No focal abnormality in the liver on this study without intravenous contrast. Layering tiny calcified gallstones evident. No intrahepatic or extrahepatic biliary dilation. Pancreas: No focal mass lesion. No dilatation of the main duct. No  intraparenchymal cyst. No peripancreatic edema. Spleen: No splenomegaly. No focal mass lesion. Adrenals/Urinary Tract: 17 mm right adrenal nodule stable since 08/10/2017 compatible with benign etiology such is adenoma left adrenal gland unremarkable. Unremarkable noncontrast appearance of the kidneys. No evidence for hydroureter. The urinary bladder appears normal for the degree of distention. Stomach/Bowel: Stomach is decompressed. Duodenum is normally positioned as is the ligament of Treitz. No small bowel wall thickening. No small bowel dilatation. The terminal ileum is normal. The appendix is normal. No gross colonic mass. No colonic wall thickening. Vascular/Lymphatic: No abdominal aortic aneurysm. There is no gastrohepatic or hepatoduodenal ligament lymphadenopathy. No retroperitoneal or mesenteric lymphadenopathy. No pelvic sidewall lymphadenopathy. Reproductive: The prostate gland and seminal vesicles are unremarkable. Other: No intraperitoneal free fluid. Musculoskeletal: No worrisome lytic or sclerotic osseous abnormality. Suspected changes of avascular necrosis noted right femoral head. IMPRESSION: 1. No acute findings in the abdomen or pelvis. Specifically, no findings to explain the patient's history of sepsis. 2. 3.5 x 3.3 cm left lower lobe pulmonary mass has been present since at least 11/24/2015 when it measured 2.5 x 2.4 cm. This lesion was better evaluated on a recent chest CT of 12/17/2020. 3. Cholelithiasis. 4. 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 Electronically Signed   By: Kennith Center M.D.   On: 01/25/2021 12:59   CT Super D Chest Wo Contrast  Result Date: 01/26/2021 CLINICAL DATA:  Shortness of breath. EXAM: CT CHEST WITHOUT CONTRAST TECHNIQUE: Multidetector CT imaging of the chest was performed using thin slice collimation for electromagnetic bronchoscopy planning purposes, without intravenous contrast. COMPARISON:  12/17/2020, 11/24/2015 and CT abdomen  08/10/2017. FINDINGS: Cardiovascular: Right PICC terminates in the right atrium. Atherosclerotic calcification of the aorta. Pulmonic trunk and heart are enlarged. No pericardial effusion. Mediastinum/Nodes: No pathologically enlarged mediastinal or axillary lymph nodes. Hilar regions are difficult to evaluate without IV contrast. Esophagus is grossly unremarkable. Lungs/Pleura: Image quality is degraded by respiratory motion and slight expiratory phase imaging. Smoothly marginated left lower lobe mass measures 3.2 x 3.3 cm, minimally larger than on remote prior exam 11/24/2015, 2.4 x 2.5 cm. Minimal subsegmental volume loss in the left lower lobe. No pleural fluid. Airway is unremarkable. Upper Abdomen: Visualized portion of the liver is unremarkable. Bilateral adrenal nodules measure 2.3 cm on the right and 20 Hounsfield units and on the left, 1.7 cm and 33 Hounsfield units, similar to 08/10/2017. Visualized portions of the kidneys, spleen, pancreas, stomach and bowel are grossly unremarkable. Musculoskeletal: No worrisome lytic or sclerotic lesions. IMPRESSION: 1. No acute findings. 2. Left lower lobe mass with indolent growth over time. Findings favor a benign lesion such as a hamartoma. 3. Bilateral adrenal nodules, similar to 08/10/2017, indicative of lipid poor adenomas. 4.  Aortic atherosclerosis (ICD10-I70.0). 5. Enlarged pulmonic trunk, indicative of pulmonary arterial hypertension. Electronically Signed   By: Leanna Battles M.D.   On: 01/26/2021 08:05     Medications:     Scheduled  Medications:  allopurinol  100 mg Oral Daily   vitamin C  1,000 mg Oral Daily   aspirin EC  81 mg Oral Daily   atorvastatin  40 mg Oral Daily   bisoprolol  2.5 mg Oral Daily   busPIRone  5 mg Oral BID   Chlorhexidine Gluconate Cloth  6 each Topical Daily   docusate sodium  100 mg Oral BID   DULoxetine  60 mg Oral Daily   empagliflozin  10 mg Oral Daily   gabapentin  100 mg Oral TID   heparin  5,000 Units  Subcutaneous Q8H   hydrALAZINE  10 mg Oral Q8H   insulin aspart  0-15 Units Subcutaneous TID WC   insulin aspart  0-5 Units Subcutaneous QHS   insulin aspart  4 Units Subcutaneous TID WC   insulin glargine  10 Units Subcutaneous BID   isosorbide mononitrate  30 mg Oral Daily   metoCLOPramide (REGLAN) injection  5 mg Intravenous Q6H   multivitamin with minerals  1 tablet Oral Daily   nutrition supplement (JUVEN)  1 packet Oral BID BM   pantoprazole  40 mg Oral BID   povidone-iodine  2 application Topical Once   sacubitril-valsartan  1 tablet Oral BID   senna  1 tablet Oral BID   sodium chloride flush  10-40 mL Intracatheter Q12H   spironolactone  12.5 mg Oral Daily   torsemide  40 mg Oral Daily   zinc sulfate  220 mg Oral Daily    Infusions:  lactated ringers 10 mL/hr at 01/21/21 0946   magnesium sulfate bolus IVPB     penicillin g continuous IV infusion 12 Million Units (01/26/21 0030)    PRN Medications: acetaminophen, albuterol, alum & mag hydroxide-simeth, baclofen, bisacodyl, guaiFENesin-dextromethorphan, hydrALAZINE, HYDROcodone-acetaminophen, HYDROcodone-acetaminophen, ipratropium-albuterol, labetalol, magnesium sulfate bolus IVPB, metoprolol tartrate, morphine injection, ondansetron, phenol, polyethylene glycol, potassium chloride, sodium chloride flush   Assessment/Plan   1. Acute on chronic systolic CHF:   -Primarily nonischemic cardiomyopathy.  Echo in 2017 with EF 15-20%.  [employer] ICD 2018. LHC/RHC 11/06/17 showed volume overload with 80% RCA stenosis.  Cardiac index low at 1.91. The degree of coronary disease does not explain his cardiomyopathy.  PYP scan in 7/21 not suggestive of transthyretin amyloidosis. Admitted 07/23 with acute on chronic CHF in setting of acute on chronic respiratory faliure with hypoxia/possible COPD exacerbation and group G strep bacteremia.   -Echo this admission with with EF 20-25%. TEE on 7/28: EF 15 to 20 percent, severely dilated LV with  normal wall thickness and diffuse hypokinesis; moderately dilated RV with moderate systolic dysfunction. No evidence of endocarditis  -Co-ox stable at 57%.  No BMP today. Check now. -CVP 3. Continue  torsemide 40 mg daily.  - ?Weight up 9 lb overnight? Appears compensated.  -Continue hydralazine 10 mg TID/Imdur 30 mg daily.  -Continue bisoprolol 2.5 mg daily -Continue Jardiance 10 mg daily -Continue spiro 12.5 mg daily. -Continue entresto 24/26 mg BID -Not adherent with medications PTA. Followed by paramedicine in the community. Has not kept most recent clinic f/u. Did not return calls to reschedule home visits this past month.   2. Acute on chronic hypoxic respiratory failure:  -Initially requiring BiPAP.Likely d/t combination of COPD exacerbation and acute on chronic CHF.  -Stable on nasal cannula.  -Has home 02 but admits he does not use it regularly  3. COPD with possible acute exacerbation:  -Never smoked, but apparently had significant occupational exposure.  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 CPX in 2/21 showed severe functional limitation, but it appeared to be due to pulmonary abnormalities rather than HF.  He is on home oxygen.  -Treated for COPD exacerbation per triad  4. Group G strep bacteremia:  -Leg wounds suspected source. On PCN G through 8/5.  No endocarditis on TEE. 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 Also has RLE wounds. Needs right sided ABI => normal. - No source on CT abdomen/pelvis yesterday.  5. CAD:   -LHC in 5/19 with 80-90% proximal RCA stenosis treated with DES.  This did not cause his cardiomyopathy, has a large RCA.   - No chest pain.  -Continue aspirin and statin  6. Polycythemia:  -Likely related to chronic hypoxia.  7. OSA:  -Cannot tolerate CPAP.  Using BiPAP here. -Has oxygen to wear at night, but not adherent in community  8. DM II, uncontrolled:  -A1c 10.3 -He is on insulin.    -Continue Jardiance  9. Hypertriglyceridemia:  -Off Vascepa due to blood in stool that resolved. -Continue fenofibrate 145 mg daily.   10. AKI on CKD:  -Has stage IIIa CKD at baseline.  -Baseline creatinine 1.5 -BMET today  SDOH: -Not adherent with follow-ups or home care visits. Enrolled in Paramedicine in the community. Will need close f/u at discharge.  -Reports access to medications and transportation. Not taking medicines as prescribed PTA. -TOC consult - plan is for CIR   Will arrange for HF clinic f/u.  Length of Stay: [address], LINDSAY N, PA-C  01/26/2021, 8:21 AM  Advanced Heart Failure Team Pager [phone] (M-F; 7a - 5p)  Please contact [facility] for night-coverage after hours (5p -7a ) and weekends on [email or web address]  Patient seen with PA, agree with the above note .  Stable today, CVP 3 and co-ox 67%.  SBP 90s-100s.   Exam unchanged, not volume overloaded.  Remains in NSR.   Stable from cardiac perspective.  BP too low to titrate up on his cardiac meds. Would send him to CIR when ready on current cardiac regimen.  He will need close followup in CHF clinic when he goes home.  We will sign off, call with questions.   Marca Ancona 01/26/2021 10:43 AM

## 2021-01-26 NOTE — Progress Notes (Addendum)
Progress Note    Richard [name]  NWG:[id number] DOB: 05/26/1961  DOA: 01/15/2021 PCP: Ananias PilgrimAsres, Alehegn, MD      Brief Narrative:    Medical records reviewed and are as summarized below:  Richard [name] is a 60 y.o. 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      Assessment/Plan:   Principal Problem:   Acute on chronic respiratory failure with hypoxia and hypercapnia (HCC) Active Problems:   Hypertension   CKD (chronic kidney disease)   Presence of implantable cardioverter-defibrillator (ICD)   GERD (gastroesophageal reflux disease)   Diabetes mellitus with diabetic neuropathy, with long-term current use of insulin (HCC)   COPD exacerbation (HCC)   Chronic systolic CHF (congestive heart failure) (HCC)   Acute renal failure superimposed on stage 3 chronic kidney disease (HCC)   Elevated troponin   Hyponatremia   Bacteremia   Chronic ulcer of left ankle (HCC)   Severe sepsis without septic shock (HCC)   Open wound   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HCC)   SOB (shortness of breath)   S/P BKA (below knee amputation), left (HCC)   Nutrition Problem: Increased nutrient needs Etiology: wound healing  Signs/Symptoms: estimated needs   Body mass index is 30.87 kg/m.  (Obesity)   Acute on chronic hypoxic respiratory failure: Continue oxygen via nasal cannula.  He is requiring BiPAP intermittently.  Acute exacerbation of chronic combined systolic and diastolic CHF, AICD in place: 2D echo showed EF estimated at 20 to 25%.  He has been evaluated by the cardiologist.  Continue torsemide and antihypertensives.  Severe sepsis likely secondary to strep group G bacteremia:  No evidence of endocarditis on TEE.  ID recommended IV penicillin G through 01/28/2021.  However, if patient is discharged prior to 01/28/2021 then he can be discharged cefadroxil 1 g twice daily.  Left hindfoot ulcer and osteomyelitis: S/p left transtibial amputation/left BKA on 01/21/2021.  Continue antibiotics.  Possible COPD exacerbation: Completed azithromycin and prednisone.  Continue bronchodilators  AKI on CKD stage IIIa: Improved air entry creatinine is at baseline.  Hiccups: Continue baclofen, Reglan and gabapentin  Other comorbidities include type II DM, CAD, hyperlipidemia, chronic venous stasis ulcer, depression  Awaiting placement to inpatient rehab.  Likely discharge to inpatient rehab tomorrow.  Diet Order             Diet - low sodium heart healthy           Diet Carb Modified Fluid consistency: Thin; Room service appropriate? Yes  Diet effective now                      Consultants: Cardiologist Infectious disease  Procedures: TEE on 01/20/2021    Medications:    allopurinol  100 mg Oral Daily   vitamin C  1,000 mg Oral Daily   aspirin EC  81 mg Oral Daily   atorvastatin  40 mg Oral Daily   bisoprolol  2.5 mg Oral Daily   busPIRone  5 mg Oral BID   Chlorhexidine Gluconate Cloth  6 each Topical Daily   docusate sodium  100 mg Oral BID   DULoxetine  60 mg Oral Daily   empagliflozin  10 mg  Oral Daily   gabapentin  100 mg Oral TID   heparin  5,000 Units Subcutaneous Q8H   hydrALAZINE  10 mg Oral Q8H   insulin aspart  0-15 Units Subcutaneous TID WC   insulin aspart  0-5 Units Subcutaneous QHS   insulin aspart  4 Units Subcutaneous TID WC   insulin glargine  10 Units Subcutaneous BID   isosorbide mononitrate  30 mg Oral Daily   metoCLOPramide (REGLAN) injection  5 mg Intravenous Q6H   multivitamin with minerals  1 tablet Oral Daily   nutrition supplement (JUVEN)  1 packet Oral BID BM   pantoprazole  40 mg Oral BID   povidone-iodine  2 application  Topical Once   sacubitril-valsartan  1 tablet Oral BID   senna  1 tablet Oral BID   sodium chloride flush  10-40 mL Intracatheter Q12H   spironolactone  12.5 mg Oral Daily   torsemide  40 mg Oral Daily   zinc sulfate  220 mg Oral Daily   Continuous Infusions:  lactated ringers 10 mL/hr at 01/21/21 0946   magnesium sulfate bolus IVPB     penicillin g continuous IV infusion 12 Million Units (01/26/21 1211)     Anti-infectives (From admission, onward)    Start     Dose/Rate Route Frequency Ordered Stop   01/26/21 0000  cefadroxil (DURICEF) 1 g tablet        1 g Oral 2 times daily 01/26/21 1251 01/28/21 2359   01/21/21 1800  ceFAZolin (ANCEF) IVPB 2g/100 mL premix        2 g 200 mL/hr over 30 Minutes Intravenous Every 8 hours 01/21/21 1124 01/22/21 0228   01/21/21 1230  penicillin G potassium 12 Million Units in dextrose 5 % 250 mL continuous infusion        12 Million Units 20.8 mL/hr over 12 Hours Intravenous Every 12 hours 01/21/21 1132     01/21/21 1000  metroNIDAZOLE (FLAGYL) tablet 500 mg  Status:  Discontinued        500 mg Oral Every 12 hours 01/21/21 0854 01/21/21 1132   01/21/21 1000  DAPTOmycin (CUBICIN) 900 mg in sodium chloride 0.9 % IVPB  Status:  Discontinued        8 mg/kg  115.3 kg 136 mL/hr over 30 Minutes Intravenous Daily 01/21/21 0854 01/21/21 1132   01/21/21 0945  ceFEPIme (MAXIPIME) 2 g in sodium chloride 0.9 % 100 mL IVPB  Status:  Discontinued        2 g 200 mL/hr over 30 Minutes Intravenous Every 8 hours 01/21/21 0854 01/21/21 1132   01/21/21 0600  ceFAZolin (ANCEF) IVPB 2g/100 mL premix        2 g 200 mL/hr over 30 Minutes Intravenous To Short Stay 01/21/21 0031 01/21/21 1022   01/18/21 2200  penicillin G potassium 12 Million Units in dextrose 5 % 250 mL continuous infusion  Status:  Discontinued        12 Million Units 20.8 mL/hr over 12 Hours Intravenous Every 12 hours 01/18/21 1131 01/21/21 0854   01/18/21 1200  vancomycin (VANCOREADY) IVPB 1750  mg/350 mL  Status:  Discontinued       See Hyperspace for full Linked Orders Report.   1,750 mg 175 mL/hr over 120 Minutes Intravenous Every 24 hours 01/18/21 1011 01/18/21 1112   01/17/21 1200  vancomycin (VANCOREADY) IVPB 1500 mg/300 mL  Status:  Discontinued       See Hyperspace for full Linked Orders Report.   1,500 mg  150 mL/hr over 120 Minutes Intravenous Every 24 hours 01/16/21 1114 01/18/21 1011   01/16/21 2200  cefTRIAXone (ROCEPHIN) 2 g in sodium chloride 0.9 % 100 mL IVPB  Status:  Discontinued        2 g 200 mL/hr over 30 Minutes Intravenous Every 24 hours 01/15/21 1927 01/18/21 1131   01/16/21 2200  azithromycin (ZITHROMAX) 500 mg in sodium chloride 0.9 % 250 mL IVPB        500 mg 250 mL/hr over 60 Minutes Intravenous Every 24 hours 01/15/21 1927 01/19/21 2225   01/16/21 1200  vancomycin (VANCOREADY) IVPB 2000 mg/400 mL       See Hyperspace for full Linked Orders Report.   2,000 mg 200 mL/hr over 120 Minutes Intravenous  Once 01/16/21 1114 01/16/21 1508   01/15/21 1845  cefTRIAXone (ROCEPHIN) 1 g in sodium chloride 0.9 % 100 mL IVPB        1 g 200 mL/hr over 30 Minutes Intravenous  Once 01/15/21 1836 01/15/21 1913   01/15/21 1845  azithromycin (ZITHROMAX) 500 mg in sodium chloride 0.9 % 250 mL IVPB        500 mg 250 mL/hr over 60 Minutes Intravenous  Once 01/15/21 1836 01/16/21 0014              Family Communication/Anticipated D/C date and plan/Code Status   DVT prophylaxis: SCD's Start: 01/21/21 1125 heparin injection 5,000 Units Start: 01/15/21 2330     Code Status: Full Code  Family Communication: None Disposition Plan:    Status is: Inpatient  Remains inpatient appropriate because:Unsafe d/c plan  Dispo: The patient is from: Home              Anticipated d/c is to: [facility]              Patient currently is medically stable to d/c.   Difficult to place patient No           Subjective:   Interval events noted.  He has no  complaints.  Objective:    Vitals:   01/26/21 0437 01/26/21 0500 01/26/21 0651 01/26/21 0805  BP: 104/82   105/74  Pulse: 66   (!) 55  Resp: 16   18  Temp:   (!) 97.4 F (36.3 C) 97.8 F (36.6 C)  TempSrc:   Oral Oral  SpO2: 100%     Weight:  118.1 kg    Height:       No data found.   Intake/Output Summary (Last 24 hours) at 01/26/2021 1253 Last data filed at 01/26/2021 1100 Gross per 24 hour  Intake 1018.61 ml  Output 1400 ml  Net -381.39 ml   Filed Weights   01/25/21 0500 01/26/21 0020 01/26/21 0500  Weight: 114 kg 118.1 kg 118.1 kg    Exam:    GEN: NAD SKIN: Wound on medial border of right MTP joint. EYES: EOMI ENT: MMM CV: RRR PULM: CTA B ABD: soft, ND, NT, +BS CNS: AAO x 3, non focal EXT: Left BKA with dressing on the stump wound       Data Reviewed:   I have personally reviewed following labs and imaging studies:  Labs: Labs show the following:   Basic Metabolic Panel: Recent Labs  Lab 01/22/21 0334 01/23/21 0622 01/24/21 0539 01/25/21 0559 01/26/21 0848  NA 131* 132* 135 134* 130*  K 4.7 4.8 4.5 4.9 4.6  CL 94* 92* 92* 91* 89*  CO2 30 33* 36* 35* 34*  GLUCOSE 186* 119*  90 76 110*  BUN 49* 43* 39* 36* 33*  CREATININE 1.37* 1.27* 1.21 1.42* 1.16  CALCIUM 8.3* 8.7* 8.4* 8.6* 8.4*   GFR Estimated Creatinine Clearance: 98.9 mL/min (by C-G formula based on SCr of 1.16 mg/dL). Liver Function Tests: Recent Labs  Lab 01/20/21 0357 01/21/21 0332  AST 21 24  ALT 22 24  ALKPHOS 65 83  BILITOT 1.4* 0.7  PROT 7.9 7.9  ALBUMIN 2.1* 2.0*   No results for input(s): LIPASE, AMYLASE in the last 168 hours. No results for input(s): AMMONIA in the last 168 hours. Coagulation profile No results for input(s): INR, PROTIME in the last 168 hours.  CBC: Recent Labs  Lab 01/21/21 0332 01/22/21 0334 01/23/21 0622 01/24/21 0539 01/25/21 0559  WBC 30.7* 16.9* 19.5* 18.0* 18.8*  NEUTROABS 26.4* 12.8* 15.8* 14.1* 14.6*  HGB 14.9 14.2 14.8 14.3  14.9  HCT 45.4 43.7 45.7 44.7 47.3  MCV 87.1 88.5 87.5 89.0 89.9  PLT 313 336 366 362 410*   Cardiac Enzymes: No results for input(s): CKTOTAL, CKMB, CKMBINDEX, TROPONINI in the last 168 hours. BNP (last 3 results) No results for input(s): PROBNP in the last 8760 hours. CBG: Recent Labs  Lab 01/25/21 1124 01/25/21 1657 01/25/21 2125 01/26/21 0557 01/26/21 1104  GLUCAP 108* 178* 154* 123* 144*   D-Dimer: No results for input(s): DDIMER in the last 72 hours. Hgb A1c: No results for input(s): HGBA1C in the last 72 hours. Lipid Profile: No results for input(s): CHOL, HDL, LDLCALC, TRIG, CHOLHDL, LDLDIRECT in the last 72 hours. Thyroid function studies: No results for input(s): TSH, T4TOTAL, T3FREE, THYROIDAB in the last 72 hours.  Invalid input(s): FREET3 Anemia work up: No results for input(s): VITAMINB12, FOLATE, FERRITIN, TIBC, IRON, RETICCTPCT in the last 72 hours. Sepsis Labs: Recent Labs  Lab 01/22/21 0334 01/23/21 0622 01/24/21 0539 01/25/21 0559  WBC 16.9* 19.5* 18.0* 18.8*    Microbiology Recent Results (from the past 240 hour(s))  Surgical pcr screen     Status: Abnormal   Collection Time: 01/21/21 12:34 AM   Specimen: Nasal Mucosa; Nasal Swab  Result Value Ref Range Status   MRSA, PCR NEGATIVE NEGATIVE Final   Staphylococcus aureus POSITIVE (A) NEGATIVE Final    Comment: (NOTE) The Xpert SA Assay (FDA approved for NASAL specimens in patients 15 years of age and older), is one component of a comprehensive surveillance program. It is not intended to diagnose infection nor to guide or monitor treatment. Performed at [facility], [address], [place], [place] [address]     Procedures and diagnostic studies:  CT ABDOMEN PELVIS WO CONTRAST  Result Date: 01/25/2021 CLINICAL DATA:  Sepsis.  Hiccups. EXAM: CT ABDOMEN AND PELVIS WITHOUT CONTRAST TECHNIQUE: Multidetector CT imaging of the abdomen and pelvis was performed following the standard  protocol without IV contrast. COMPARISON:  08/10/2017 chest CT 11/24/2015 FINDINGS: Lower chest: Well defined left lower lobe pulmonary mass measures 3.5 x 3.3 cm. This lesion has been present since at least 11/24/2015 when it measured 2.5 x 2.4 cm. This was better evaluated on a recent chest CT of 12/17/2020. Hepatobiliary: No focal abnormality in the liver on this study without intravenous contrast. Layering tiny calcified gallstones evident. No intrahepatic or extrahepatic biliary dilation. Pancreas: No focal mass lesion. No dilatation of the main duct. No intraparenchymal cyst. No peripancreatic edema. Spleen: No splenomegaly. No focal mass lesion. Adrenals/Urinary Tract: 17 mm right adrenal nodule stable since 08/10/2017 compatible with benign etiology such is adenoma left  adrenal gland unremarkable. Unremarkable noncontrast appearance of the kidneys. No evidence for hydroureter. The urinary bladder appears normal for the degree of distention. Stomach/Bowel: Stomach is decompressed. Duodenum is normally positioned as is the ligament of Treitz. No small bowel wall thickening. No small bowel dilatation. The terminal ileum is normal. The appendix is normal. No gross colonic mass. No colonic wall thickening. Vascular/Lymphatic: No abdominal aortic aneurysm. There is no gastrohepatic or hepatoduodenal ligament lymphadenopathy. No retroperitoneal or mesenteric lymphadenopathy. No pelvic sidewall lymphadenopathy. Reproductive: The prostate gland and seminal vesicles are unremarkable. Other: No intraperitoneal free fluid. Musculoskeletal: No worrisome lytic or sclerotic osseous abnormality. Suspected changes of avascular necrosis noted right femoral head. IMPRESSION: 1. No acute findings in the abdomen or pelvis. Specifically, no findings to explain the patient's history of sepsis. 2. 3.5 x 3.3 cm left lower lobe pulmonary mass has been present since at least 11/24/2015 when it measured 2.5 x 2.4 cm. This lesion was  better evaluated on a recent chest CT of 12/17/2020. 3. Cholelithiasis. 4. 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 Electronically Signed   By: Kennith Center M.D.   On: 01/25/2021 12:59   CT Super D Chest Wo Contrast  Result Date: 01/26/2021 CLINICAL DATA:  Shortness of breath. EXAM: CT CHEST WITHOUT CONTRAST TECHNIQUE: Multidetector CT imaging of the chest was performed using thin slice collimation for electromagnetic bronchoscopy planning purposes, without intravenous contrast. COMPARISON:  12/17/2020, 11/24/2015 and CT abdomen 08/10/2017. FINDINGS: Cardiovascular: Right PICC terminates in the right atrium. Atherosclerotic calcification of the aorta. Pulmonic trunk and heart are enlarged. No pericardial effusion. Mediastinum/Nodes: No pathologically enlarged mediastinal or axillary lymph nodes. Hilar regions are difficult to evaluate without IV contrast. Esophagus is grossly unremarkable. Lungs/Pleura: Image quality is degraded by respiratory motion and slight expiratory phase imaging. Smoothly marginated left lower lobe mass measures 3.2 x 3.3 cm, minimally larger than on remote prior exam 11/24/2015, 2.4 x 2.5 cm. Minimal subsegmental volume loss in the left lower lobe. No pleural fluid. Airway is unremarkable. Upper Abdomen: Visualized portion of the liver is unremarkable. Bilateral adrenal nodules measure 2.3 cm on the right and 20 Hounsfield units and on the left, 1.7 cm and 33 Hounsfield units, similar to 08/10/2017. Visualized portions of the kidneys, spleen, pancreas, stomach and bowel are grossly unremarkable. Musculoskeletal: No worrisome lytic or sclerotic lesions. IMPRESSION: 1. No acute findings. 2. Left lower lobe mass with indolent growth over time. Findings favor a benign lesion such as a hamartoma. 3. Bilateral adrenal nodules, similar to 08/10/2017, indicative of lipid poor adenomas. 4.  Aortic atherosclerosis (ICD10-I70.0). 5. Enlarged pulmonic trunk, indicative of  pulmonary arterial hypertension. Electronically Signed   By: Leanna Battles M.D.   On: 01/26/2021 08:05               LOS: 11 days   Lizeth Bencosme  [facility]   Pager on [email or web address]. If 7PM-7AM, please contact night-coverage at [email or web address]     01/26/2021, 12:53 PM

## 2021-01-27 ENCOUNTER — Inpatient Hospital Stay (HOSPITAL_COMMUNITY)
Admission: RE | Admit: 2021-01-27 | Discharge: 2021-02-01 | DRG: 560 | Disposition: A | Discharge status: Other Acute Inpatient Hospital | Payer: Medicare Other | Source: Intra-hospital | Attending: Physical Medicine and Rehabilitation | Admitting: Physical Medicine and Rehabilitation

## 2021-01-27 ENCOUNTER — Encounter (HOSPITAL_COMMUNITY): Payer: Self-pay | Admitting: Physical Medicine and Rehabilitation

## 2021-01-27 ENCOUNTER — Other Ambulatory Visit: Payer: Self-pay

## 2021-01-27 DIAGNOSIS — Z8249 Family history of ischemic heart disease and other diseases of the circulatory system: Secondary | ICD-10-CM

## 2021-01-27 DIAGNOSIS — J9612 Chronic respiratory failure with hypercapnia: Secondary | ICD-10-CM | POA: Diagnosis present

## 2021-01-27 DIAGNOSIS — I951 Orthostatic hypotension: Secondary | ICD-10-CM | POA: Diagnosis not present

## 2021-01-27 DIAGNOSIS — Z79899 Other long term (current) drug therapy: Secondary | ICD-10-CM

## 2021-01-27 DIAGNOSIS — I5042 Chronic combined systolic (congestive) and diastolic (congestive) heart failure: Secondary | ICD-10-CM | POA: Diagnosis present

## 2021-01-27 DIAGNOSIS — S88112A Complete traumatic amputation at level between knee and ankle, left lower leg, initial encounter: Secondary | ICD-10-CM | POA: Diagnosis not present

## 2021-01-27 DIAGNOSIS — E7800 Pure hypercholesterolemia, unspecified: Secondary | ICD-10-CM | POA: Diagnosis present

## 2021-01-27 DIAGNOSIS — L97811 Non-pressure chronic ulcer of other part of right lower leg limited to breakdown of skin: Secondary | ICD-10-CM | POA: Diagnosis present

## 2021-01-27 DIAGNOSIS — Z794 Long term (current) use of insulin: Secondary | ICD-10-CM

## 2021-01-27 DIAGNOSIS — I5022 Chronic systolic (congestive) heart failure: Secondary | ICD-10-CM | POA: Diagnosis present

## 2021-01-27 DIAGNOSIS — I739 Peripheral vascular disease, unspecified: Secondary | ICD-10-CM | POA: Diagnosis not present

## 2021-01-27 DIAGNOSIS — J449 Chronic obstructive pulmonary disease, unspecified: Secondary | ICD-10-CM | POA: Diagnosis present

## 2021-01-27 DIAGNOSIS — L97502 Non-pressure chronic ulcer of other part of unspecified foot with fat layer exposed: Secondary | ICD-10-CM

## 2021-01-27 DIAGNOSIS — N1831 Chronic kidney disease, stage 3a: Secondary | ICD-10-CM | POA: Diagnosis present

## 2021-01-27 DIAGNOSIS — G546 Phantom limb syndrome with pain: Secondary | ICD-10-CM | POA: Diagnosis present

## 2021-01-27 DIAGNOSIS — Z885 Allergy status to narcotic agent status: Secondary | ICD-10-CM

## 2021-01-27 DIAGNOSIS — Z7982 Long term (current) use of aspirin: Secondary | ICD-10-CM

## 2021-01-27 DIAGNOSIS — E871 Hypo-osmolality and hyponatremia: Secondary | ICD-10-CM | POA: Diagnosis present

## 2021-01-27 DIAGNOSIS — Z89512 Acquired absence of left leg below knee: Secondary | ICD-10-CM | POA: Diagnosis not present

## 2021-01-27 DIAGNOSIS — R066 Hiccough: Secondary | ICD-10-CM | POA: Diagnosis present

## 2021-01-27 DIAGNOSIS — R7881 Bacteremia: Secondary | ICD-10-CM | POA: Diagnosis present

## 2021-01-27 DIAGNOSIS — E1169 Type 2 diabetes mellitus with other specified complication: Secondary | ICD-10-CM

## 2021-01-27 DIAGNOSIS — J9611 Chronic respiratory failure with hypoxia: Secondary | ICD-10-CM | POA: Diagnosis present

## 2021-01-27 DIAGNOSIS — Z87891 Personal history of nicotine dependence: Secondary | ICD-10-CM | POA: Diagnosis not present

## 2021-01-27 DIAGNOSIS — I70234 Atherosclerosis of native arteries of right leg with ulceration of heel and midfoot: Secondary | ICD-10-CM | POA: Diagnosis not present

## 2021-01-27 DIAGNOSIS — I130 Hypertensive heart and chronic kidney disease with heart failure and stage 1 through stage 4 chronic kidney disease, or unspecified chronic kidney disease: Secondary | ICD-10-CM | POA: Diagnosis present

## 2021-01-27 DIAGNOSIS — K5900 Constipation, unspecified: Secondary | ICD-10-CM | POA: Diagnosis present

## 2021-01-27 DIAGNOSIS — Z833 Family history of diabetes mellitus: Secondary | ICD-10-CM

## 2021-01-27 DIAGNOSIS — I2510 Atherosclerotic heart disease of native coronary artery without angina pectoris: Secondary | ICD-10-CM | POA: Diagnosis present

## 2021-01-27 DIAGNOSIS — E669 Obesity, unspecified: Secondary | ICD-10-CM

## 2021-01-27 DIAGNOSIS — E1152 Type 2 diabetes mellitus with diabetic peripheral angiopathy with gangrene: Secondary | ICD-10-CM | POA: Diagnosis not present

## 2021-01-27 DIAGNOSIS — L97511 Non-pressure chronic ulcer of other part of right foot limited to breakdown of skin: Secondary | ICD-10-CM | POA: Diagnosis present

## 2021-01-27 DIAGNOSIS — Z95810 Presence of automatic (implantable) cardiac defibrillator: Secondary | ICD-10-CM

## 2021-01-27 DIAGNOSIS — Z7951 Long term (current) use of inhaled steroids: Secondary | ICD-10-CM

## 2021-01-27 DIAGNOSIS — K219 Gastro-esophageal reflux disease without esophagitis: Secondary | ICD-10-CM | POA: Diagnosis present

## 2021-01-27 DIAGNOSIS — Z9981 Dependence on supplemental oxygen: Secondary | ICD-10-CM | POA: Diagnosis not present

## 2021-01-27 DIAGNOSIS — F419 Anxiety disorder, unspecified: Secondary | ICD-10-CM | POA: Diagnosis present

## 2021-01-27 DIAGNOSIS — E1122 Type 2 diabetes mellitus with diabetic chronic kidney disease: Secondary | ICD-10-CM | POA: Diagnosis present

## 2021-01-27 DIAGNOSIS — Z91018 Allergy to other foods: Secondary | ICD-10-CM

## 2021-01-27 DIAGNOSIS — N189 Chronic kidney disease, unspecified: Secondary | ICD-10-CM | POA: Diagnosis present

## 2021-01-27 DIAGNOSIS — Z955 Presence of coronary angioplasty implant and graft: Secondary | ICD-10-CM | POA: Diagnosis not present

## 2021-01-27 DIAGNOSIS — Z4781 Encounter for orthopedic aftercare following surgical amputation: Principal | ICD-10-CM

## 2021-01-27 DIAGNOSIS — I509 Heart failure, unspecified: Secondary | ICD-10-CM | POA: Diagnosis not present

## 2021-01-27 DIAGNOSIS — Z886 Allergy status to analgesic agent status: Secondary | ICD-10-CM

## 2021-01-27 DIAGNOSIS — E11621 Type 2 diabetes mellitus with foot ulcer: Secondary | ICD-10-CM | POA: Diagnosis present

## 2021-01-27 DIAGNOSIS — E1140 Type 2 diabetes mellitus with diabetic neuropathy, unspecified: Secondary | ICD-10-CM | POA: Diagnosis present

## 2021-01-27 DIAGNOSIS — J42 Unspecified chronic bronchitis: Secondary | ICD-10-CM

## 2021-01-27 LAB — COOXEMETRY PANEL
Carboxyhemoglobin: 1.2 % (ref 0.5–1.5)
Methemoglobin: 0.9 % (ref 0.0–1.5)
O2 Saturation: 41.9 %
Total hemoglobin: 14.2 g/dL (ref 12.0–16.0)

## 2021-01-27 LAB — BASIC METABOLIC PANEL
Anion gap: 8 (ref 5–15)
BUN: 39 mg/dL — ABNORMAL HIGH (ref 6–20)
CO2: 33 mmol/L — ABNORMAL HIGH (ref 22–32)
Calcium: 8.8 mg/dL — ABNORMAL LOW (ref 8.9–10.3)
Chloride: 90 mmol/L — ABNORMAL LOW (ref 98–111)
Creatinine, Ser: 1.34 mg/dL — ABNORMAL HIGH (ref 0.61–1.24)
GFR, Estimated: >60 mL/min (ref >60)
Glucose, Bld: 131 mg/dL — ABNORMAL HIGH (ref 70–99)
Potassium: 4.7 mmol/L (ref 3.5–5.1)
Sodium: 131 mmol/L — ABNORMAL LOW (ref 135–145)

## 2021-01-27 LAB — CBC WITH DIFFERENTIAL/PLATELET
Abs Immature Granulocytes: 0.17 10*3/uL — ABNORMAL HIGH (ref 0.00–0.07)
Basophils Absolute: 0 10*3/uL (ref 0.0–0.1)
Basophils Relative: 0 %
Eosinophils Absolute: 0 10*3/uL (ref 0.0–0.5)
Eosinophils Relative: 0 %
HCT: 43 % (ref 39.0–52.0)
Hemoglobin: 13.9 g/dL (ref 13.0–17.0)
Immature Granulocytes: 1 %
Lymphocytes Relative: 9 %
Lymphs Abs: 1.4 10*3/uL (ref 0.7–4.0)
MCH: 28.7 pg (ref 26.0–34.0)
MCHC: 32.3 g/dL (ref 30.0–36.0)
MCV: 88.7 fL (ref 80.0–100.0)
Monocytes Absolute: 1.4 10*3/uL — ABNORMAL HIGH (ref 0.1–1.0)
Monocytes Relative: 9 %
Neutro Abs: 12.2 10*3/uL — ABNORMAL HIGH (ref 1.7–7.7)
Neutrophils Relative %: 81 %
Platelets: 378 10*3/uL (ref 150–400)
RBC: 4.85 MIL/uL (ref 4.22–5.81)
RDW: 18 % — ABNORMAL HIGH (ref 11.5–15.5)
WBC: 15.2 10*3/uL — ABNORMAL HIGH (ref 4.0–10.5)
nRBC: 0 % (ref 0.0–0.2)

## 2021-01-27 LAB — GLUCOSE, CAPILLARY
Glucose-Capillary: 110 mg/dL — ABNORMAL HIGH (ref 70–99)
Glucose-Capillary: 124 mg/dL — ABNORMAL HIGH (ref 70–99)
Glucose-Capillary: 152 mg/dL — ABNORMAL HIGH (ref 70–99)
Glucose-Capillary: 107 mg/dL — ABNORMAL HIGH (ref 70–99)

## 2021-01-27 MED ORDER — CHLORHEXIDINE GLUCONATE CLOTH 2 % EX PADS
6.0000 | MEDICATED_PAD | Freq: Every day | CUTANEOUS | Status: DC
  Administered 2021-01-28 – 2021-01-31 (×4): 6 via TOPICAL

## 2021-01-27 MED ORDER — ALLOPURINOL 100 MG PO TABS
100.0000 mg | ORAL_TABLET | Freq: Every day | ORAL | Status: DC
  Administered 2021-01-28 – 2021-01-31 (×4): 100 mg via ORAL
  Filled 2021-01-27 (×5): qty 1

## 2021-01-27 MED ORDER — TRAMADOL HCL 50 MG PO TABS: 50.0000 mg | ORAL_TABLET | Freq: Four times a day (QID) | ORAL | Status: DC | PRN

## 2021-01-27 MED ORDER — EMPAGLIFLOZIN 10 MG PO TABS
10.0000 mg | ORAL_TABLET | Freq: Every day | ORAL | Status: DC
  Administered 2021-01-28 – 2021-01-31 (×4): 10 mg via ORAL
  Filled 2021-01-27 (×5): qty 1

## 2021-01-27 MED ORDER — SODIUM CHLORIDE 0.9% FLUSH
10.0000 mL | Freq: Two times a day (BID) | INTRAVENOUS | Status: DC
  Administered 2021-01-28 – 2021-01-29 (×2): 20 mL
  Administered 2021-01-29 – 2021-01-30 (×2): 10 mL

## 2021-01-27 MED ORDER — FLEET ENEMA 7-19 GM/118ML RE ENEM: 1.0000 | ENEMA | Freq: Once | RECTAL | Status: DC | PRN

## 2021-01-27 MED ORDER — ACETAMINOPHEN 325 MG PO TABS: 325.0000 mg | ORAL_TABLET | ORAL | Status: DC | PRN

## 2021-01-27 MED ORDER — INSULIN GLARGINE-YFGN 100 UNIT/ML ~~LOC~~ SOLN
10.0000 [IU] | Freq: Two times a day (BID) | SUBCUTANEOUS | Status: DC
  Administered 2021-01-27 – 2021-01-30 (×6): 10 [IU] via SUBCUTANEOUS
  Filled 2021-01-27 (×7): qty 0.1

## 2021-01-27 MED ORDER — DEXTROSE 5 % IV SOLN
12.0000 10*6.[IU] | Freq: Two times a day (BID) | INTRAVENOUS | Status: AC
  Administered 2021-01-28: 12 10*6.[IU] via INTRAVENOUS
  Filled 2021-01-27 (×3): qty 12

## 2021-01-27 MED ORDER — TORSEMIDE 20 MG PO TABS
40.0000 mg | ORAL_TABLET | Freq: Every day | ORAL | Status: DC
  Administered 2021-01-28 – 2021-01-31 (×4): 40 mg via ORAL
  Filled 2021-01-27 (×5): qty 2

## 2021-01-27 MED ORDER — PROCHLORPERAZINE EDISYLATE 10 MG/2ML IJ SOLN: 5.0000 mg | Freq: Four times a day (QID) | INTRAMUSCULAR | Status: DC | PRN

## 2021-01-27 MED ORDER — GABAPENTIN 100 MG PO CAPS
100.0000 mg | ORAL_CAPSULE | Freq: Three times a day (TID) | ORAL | Status: DC
  Administered 2021-01-27 – 2021-01-31 (×13): 100 mg via ORAL
  Filled 2021-01-27 (×14): qty 1

## 2021-01-27 MED ORDER — ALUM & MAG HYDROXIDE-SIMETH 200-200-20 MG/5ML PO SUSP: 30.0000 mL | ORAL | Status: DC | PRN

## 2021-01-27 MED ORDER — HYDROCODONE-ACETAMINOPHEN 7.5-325 MG PO TABS
1.0000 | ORAL_TABLET | ORAL | Status: DC | PRN
  Administered 2021-01-27 – 2021-01-31 (×7): 2 via ORAL
  Filled 2021-01-27 (×7): qty 2

## 2021-01-27 MED ORDER — ATORVASTATIN CALCIUM 40 MG PO TABS
40.0000 mg | ORAL_TABLET | Freq: Every day | ORAL | Status: DC
  Administered 2021-01-28 – 2021-01-31 (×4): 40 mg via ORAL
  Filled 2021-01-27 (×5): qty 1

## 2021-01-27 MED ORDER — ISOSORBIDE MONONITRATE ER 30 MG PO TB24
30.0000 mg | ORAL_TABLET | Freq: Every day | ORAL | Status: DC
  Administered 2021-01-28 – 2021-01-31 (×4): 30 mg via ORAL
  Filled 2021-01-27 (×5): qty 1

## 2021-01-27 MED ORDER — GUAIFENESIN-DM 100-10 MG/5ML PO SYRP: 5.0000 mL | ORAL_SOLUTION | Freq: Four times a day (QID) | ORAL | Status: DC | PRN

## 2021-01-27 MED ORDER — BISACODYL 10 MG RE SUPP
10.0000 mg | Freq: Every day | RECTAL | Status: DC | PRN
Start: 2021-01-27 — End: 2021-02-01

## 2021-01-27 MED ORDER — DULOXETINE HCL 30 MG PO CPEP
60.0000 mg | ORAL_CAPSULE | Freq: Every day | ORAL | Status: DC
  Administered 2021-01-28 – 2021-01-31 (×4): 60 mg via ORAL
  Filled 2021-01-27 (×5): qty 2

## 2021-01-27 MED ORDER — ASPIRIN EC 81 MG PO TBEC
81.0000 mg | DELAYED_RELEASE_TABLET | Freq: Every day | ORAL | Status: DC
  Administered 2021-01-28 – 2021-01-31 (×4): 81 mg via ORAL
  Filled 2021-01-27 (×4): qty 1

## 2021-01-27 MED ORDER — ALBUTEROL SULFATE (2.5 MG/3ML) 0.083% IN NEBU
2.5000 mg | INHALATION_SOLUTION | RESPIRATORY_TRACT | Status: DC | PRN
  Filled 2021-01-27: qty 3

## 2021-01-27 MED ORDER — BUSPIRONE HCL 5 MG PO TABS
5.0000 mg | ORAL_TABLET | Freq: Two times a day (BID) | ORAL | Status: DC
  Administered 2021-01-27 – 2021-01-31 (×9): 5 mg via ORAL
  Filled 2021-01-27 (×10): qty 1

## 2021-01-27 MED ORDER — TRAZODONE HCL 50 MG PO TABS
25.0000 mg | ORAL_TABLET | Freq: Every evening | ORAL | Status: DC | PRN
  Administered 2021-01-27 – 2021-01-31 (×5): 50 mg via ORAL
  Filled 2021-01-27 (×5): qty 1

## 2021-01-27 MED ORDER — BACLOFEN 5 MG HALF TABLET: 5.0000 mg | ORAL_TABLET | Freq: Three times a day (TID) | ORAL | Status: DC | PRN

## 2021-01-27 MED ORDER — SENNOSIDES-DOCUSATE SODIUM 8.6-50 MG PO TABS
2.0000 | ORAL_TABLET | Freq: Every day | ORAL | Status: DC
  Administered 2021-01-28 – 2021-01-30 (×3): 2 via ORAL
  Filled 2021-01-27 (×4): qty 2

## 2021-01-27 MED ORDER — ADULT MULTIVITAMIN W/MINERALS CH
1.0000 | ORAL_TABLET | Freq: Every day | ORAL | Status: DC
  Administered 2021-01-28 – 2021-01-31 (×4): 1 via ORAL
  Filled 2021-01-27 (×5): qty 1

## 2021-01-27 MED ORDER — POLYETHYLENE GLYCOL 3350 17 G PO PACK
17.0000 g | PACK | Freq: Every day | ORAL | Status: DC | PRN
  Administered 2021-01-28: 17 g via ORAL
  Filled 2021-01-27: qty 1

## 2021-01-27 MED ORDER — PROCHLORPERAZINE MALEATE 5 MG PO TABS
5.0000 mg | ORAL_TABLET | Freq: Four times a day (QID) | ORAL | Status: DC | PRN
  Administered 2021-01-29 – 2021-01-31 (×2): 10 mg via ORAL
  Administered 2021-02-01: 5 mg via ORAL
  Filled 2021-01-27 (×3): qty 2

## 2021-01-27 MED ORDER — INSULIN ASPART 100 UNIT/ML IJ SOLN
0.0000 [IU] | Freq: Three times a day (TID) | INTRAMUSCULAR | Status: DC
  Administered 2021-01-28 – 2021-01-29 (×3): 3 [IU] via SUBCUTANEOUS
  Administered 2021-01-29 – 2021-01-30 (×3): 2 [IU] via SUBCUTANEOUS
  Administered 2021-01-30: 5 [IU] via SUBCUTANEOUS
  Administered 2021-01-31 (×2): 2 [IU] via SUBCUTANEOUS
  Administered 2021-01-31: 3 [IU] via SUBCUTANEOUS

## 2021-01-27 MED ORDER — JUVEN PO PACK
1.0000 | PACK | Freq: Two times a day (BID) | ORAL | Status: DC
  Administered 2021-01-28 – 2021-01-31 (×7): 1 via ORAL
  Filled 2021-01-27 (×7): qty 1

## 2021-01-27 MED ORDER — HYDRALAZINE HCL 10 MG PO TABS
10.0000 mg | ORAL_TABLET | Freq: Three times a day (TID) | ORAL | Status: DC
  Administered 2021-01-27 – 2021-01-28 (×2): 10 mg via ORAL
  Filled 2021-01-27 (×2): qty 1

## 2021-01-27 MED ORDER — INSULIN ASPART 100 UNIT/ML IJ SOLN
0.0000 [IU] | Freq: Every day | INTRAMUSCULAR | Status: DC
  Administered 2021-01-29: 2 [IU] via SUBCUTANEOUS

## 2021-01-27 MED ORDER — BISACODYL 5 MG PO TBEC: 5.0000 mg | DELAYED_RELEASE_TABLET | Freq: Every day | ORAL | Status: DC | PRN

## 2021-01-27 MED ORDER — HEPARIN SOD (PORK) LOCK FLUSH 100 UNIT/ML IV SOLN
250.0000 [IU] | INTRAVENOUS | Status: DC | PRN
  Filled 2021-01-27: qty 2.5

## 2021-01-27 MED ORDER — SPIRONOLACTONE 25 MG PO TABS
12.5000 mg | ORAL_TABLET | Freq: Every day | ORAL | Status: DC
  Administered 2021-01-28 – 2021-01-31 (×4): 12.5 mg via ORAL
  Filled 2021-01-27 (×5): qty 0.5

## 2021-01-27 MED ORDER — ASCORBIC ACID 500 MG PO TABS
1000.0000 mg | ORAL_TABLET | Freq: Every day | ORAL | Status: DC
  Administered 2021-01-28 – 2021-01-31 (×4): 1000 mg via ORAL
  Filled 2021-01-27 (×5): qty 2

## 2021-01-27 MED ORDER — INSULIN ASPART 100 UNIT/ML IJ SOLN
4.0000 [IU] | Freq: Three times a day (TID) | INTRAMUSCULAR | Status: DC
  Administered 2021-01-28 – 2021-01-31 (×12): 4 [IU] via SUBCUTANEOUS

## 2021-01-27 MED ORDER — IPRATROPIUM-ALBUTEROL 0.5-2.5 (3) MG/3ML IN SOLN: 3.0000 mL | Freq: Two times a day (BID) | RESPIRATORY_TRACT | Status: DC | PRN

## 2021-01-27 MED ORDER — PANTOPRAZOLE SODIUM 40 MG PO TBEC
40.0000 mg | DELAYED_RELEASE_TABLET | Freq: Two times a day (BID) | ORAL | Status: DC
  Administered 2021-01-27 – 2021-01-31 (×9): 40 mg via ORAL
  Filled 2021-01-27 (×10): qty 1

## 2021-01-27 MED ORDER — SODIUM CHLORIDE 0.9% FLUSH
10.0000 mL | INTRAVENOUS | Status: DC | PRN
  Administered 2021-02-01: 20 mL

## 2021-01-27 MED ORDER — ZINC SULFATE 220 (50 ZN) MG PO CAPS
220.0000 mg | ORAL_CAPSULE | Freq: Every day | ORAL | Status: DC
  Administered 2021-01-28 – 2021-01-31 (×4): 220 mg via ORAL
  Filled 2021-01-27 (×5): qty 1

## 2021-01-27 MED ORDER — PROCHLORPERAZINE 25 MG RE SUPP: 12.5000 mg | Freq: Four times a day (QID) | RECTAL | Status: DC | PRN

## 2021-01-27 MED ORDER — BISOPROLOL FUMARATE 5 MG PO TABS
2.5000 mg | ORAL_TABLET | Freq: Every day | ORAL | Status: DC
  Administered 2021-01-28 – 2021-01-31 (×4): 2.5 mg via ORAL
  Filled 2021-01-27 (×5): qty 0.5

## 2021-01-27 MED ORDER — SACUBITRIL-VALSARTAN 24-26 MG PO TABS
1.0000 | ORAL_TABLET | Freq: Two times a day (BID) | ORAL | Status: DC
  Administered 2021-01-27 – 2021-01-31 (×9): 1 via ORAL
  Filled 2021-01-27 (×10): qty 1

## 2021-01-27 MED ORDER — ENOXAPARIN SODIUM 40 MG/0.4ML IJ SOSY
40.0000 mg | PREFILLED_SYRINGE | INTRAMUSCULAR | Status: DC
  Administered 2021-01-27 – 2021-01-30 (×4): 40 mg via SUBCUTANEOUS
  Filled 2021-01-27 (×5): qty 0.4

## 2021-01-27 MED ORDER — DIPHENHYDRAMINE HCL 12.5 MG/5ML PO ELIX: 12.5000 mg | ORAL_SOLUTION | Freq: Four times a day (QID) | ORAL | Status: DC | PRN

## 2021-01-27 NOTE — Progress Notes (Signed)
Patient arrived from [place] and arrived to 4W12. Patient Alert and oriented x3. Pain level 0/10. Patient assessment completed.  .me

## 2021-01-27 NOTE — Care Management Important Message (Signed)
Important Message  Patient Details  Name: Richard [name] MRN: [id number] Date of Birth: 04-Aug-1960   Medicare Important Message Given:  Yes     Renie Ora 01/27/2021, 9:59 AM

## 2021-01-27 NOTE — Progress Notes (Signed)
Meredith Staggers, MD   Physician  Nursing  PMR Pre-admission      Signed  Date of Service:  01/24/2021  8:48 AM       Related encounter: ED to Hosp-Admission (Current) from 01/15/2021 in [place] [facility]       Signed                                                                                                                                                                                                                                                                                                                                                                                                                                                                                                                           PMR Admission Coordinator Pre-Admission Assessment   Patient: Richard [name] is an 60 y.o., male MRN: [id number] DOB: 1961-02-07 Height: _0  (195.6 cm) Weight: 109.5 kg   Insurance Information HMO:     PPO:  PCP:      IPA:      80/20: yes     OTHER: PRIMARY: Medicare Part A and B       Policy#: [id number]      Subscriber: Pt Phone#: Verified online    Fax#: Pre-Cert#:       Employer: Benefits:  Phone #:      Name: Eff. Date: Parts A ad B effective 09/24/2004  Deduct: $1556      Out of Pocket Max:  None      Life Max: N/A  CIR: 100%      SNF: 100 days Outpatient: 80%     Co-Pay: 20% Home Health: 100%      Co-Pay: none DME: 80%     Co-Pay: 20% Providers: patient's choice  SECONDARY:Medicaid [place] Access   Policy#: [id number]     Phone#:   Financial Counselor:       Phone#:   The Engineer, petroleum" for patients in Inpatient Rehabilitation Facilities with attached "Privacy Act [place] Records" was provided and verbally reviewed with: Patient   Emergency  Contact Information Contact Information       Name Relation Home Work Mobile    [place]     [phone]    Harman,Helen Mother [phone]        Medley,Dorothy Relative [phone]               Current Medical History  Patient Admitting Diagnosis: L BKA   History of Present Illness:Pt is a 60 y.o. male  with a past medical history of DM2, COPD (on 2L O2 baseline), CHF, AICD, CKD, HTN.male admitted 01/15/21 with SOB, body aches. Workup for CHF vs COPD exacerbation. 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 TEE was negative. Pt. 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 PT. Also with uncontrolled diabetes, with A1c of 10.3 on admission. CIR consulted to assist in return to PLOF   [name] has been reviewed by the rehabilitation admission coordinator and physician.   Past Medical History      Past Medical History:  Diagnosis Date   AICD (automatic cardioverter/defibrillator) present 01/11/2017   Anxiety     CHF (congestive heart failure) (HCC)     CKD (chronic kidney disease)     COPD (chronic obstructive pulmonary disease) ([place])      never smoker, industrial exposure   Diabetes mellitus with diabetic neuropathy, with long-term current use of insulin (HCC)     GERD (gastroesophageal reflux disease)     High cholesterol     History of gout      "take RX qd" (11/01/2017)   Hypertension     Nonobstructive atherosclerosis of coronary artery     On home oxygen therapy      "2L prn" (11/01/2017)   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   Prior  Rehab/Hospitalizations Has the patient had prior rehab or hospitalizations prior to admission? No   Has the patient had major surgery during 100 days prior to admission? Yes  Current Medications   Current Facility-Administered Medications:   acetaminophen (TYLENOL) tablet 325-650 mg, 325-650 mg, Oral, Q6H PRN, Newt Minion, MD   albuterol (PROVENTIL) (2.5 MG/3ML) 0.083% nebulizer solution 2.5 mg, 2.5 mg, Nebulization, Q2H PRN, Newt Minion, MD   allopurinol (ZYLOPRIM) tablet 100 mg, 100 mg, Oral, Daily, Newt Minion, MD, 100 mg at 01/27/21 1105   alum & mag hydroxide-simeth (MAALOX/MYLANTA) 200-200-20 MG/5ML suspension 15-30 mL, 15-30 mL, Oral, Q2H PRN, Newt Minion, MD, 30 mL at 01/26/21 1425   ascorbic acid (VITAMIN C) tablet 1,000 mg, 1,000 mg, Oral, Daily, Newt Minion, MD, 1,000 mg at 01/27/21 1105   aspirin EC tablet 81 mg, 81 mg, Oral, Daily, Newt Minion, MD, 81 mg at 01/27/21 1107   atorvastatin (LIPITOR) tablet 40 mg, 40 mg, Oral, Daily, Newt Minion, MD, 40 mg at 01/27/21 1106   baclofen (LIORESAL) tablet 5 mg, 5 mg, Oral, TID PRN, Regalado, Belkys A, MD, 5 mg at 01/26/21 2232   bisacodyl (DULCOLAX) EC tablet 5 mg, 5 mg, Oral, Daily PRN, Newt Minion, MD   bisoprolol (ZEBETA) tablet 2.5 mg, 2.5 mg, Oral, Daily, Skeet Latch, MD, 2.5 mg at 01/26/21 0911   busPIRone (BUSPAR) tablet 5 mg, 5 mg, Oral, BID, Regalado, Belkys A, MD, 5 mg at 01/27/21 1106   Chlorhexidine Gluconate Cloth 2 % PADS 6 each, 6 each, Topical, Daily, Newt Minion, MD, 6 each at 01/26/21 1200   docusate sodium (COLACE) capsule 100 mg, 100 mg, Oral, BID, Newt Minion, MD, 100 mg at 01/27/21 1106   DULoxetine (CYMBALTA) DR capsule 60 mg, 60 mg, Oral, Daily, Regalado, Belkys A, MD, 60 mg at 01/27/21 1107   empagliflozin (JARDIANCE) tablet 10 mg, 10 mg, Oral, Daily, Newt Minion, MD, 10 mg at 01/27/21 1106   gabapentin (NEURONTIN) capsule 100 mg, 100 mg, Oral, TID, Newt Minion,  MD, 100 mg at 01/27/21 1052   guaiFENesin-dextromethorphan (ROBITUSSIN DM) 100-10 MG/5ML syrup 15 mL, 15 mL, Oral, Q4H PRN, Newt Minion, MD   heparin injection 5,000 Units, 5,000 Units, Subcutaneous, Q8H, Newt Minion, MD, 5,000 Units at 01/27/21 2751   hydrALAZINE (APRESOLINE) injection 5 mg, 5 mg, Intravenous, Q20 Min PRN, Newt Minion, MD   hydrALAZINE (APRESOLINE) tablet 10 mg, 10 mg, Oral, Q8H, Newt Minion, MD, 10 mg at 01/26/21 2232   HYDROcodone-acetaminophen (NORCO) 7.5-325 MG per tablet 1-2 tablet, 1-2 tablet, Oral, Q4H PRN, Newt Minion, MD, 2 tablet at 01/27/21 0640   HYDROcodone-acetaminophen (NORCO/VICODIN) 5-325 MG per tablet 1-2 tablet, 1-2 tablet, Oral, Q4H PRN, Newt Minion, MD, 2 tablet at 01/26/21 2232   insulin aspart (novoLOG) injection 0-15 Units, 0-15 Units, Subcutaneous, TID WC, Newt Minion, MD, 2 Units at 01/26/21 1719   insulin aspart (novoLOG) injection 0-5 Units, 0-5 Units, Subcutaneous, QHS, Newt Minion, MD, 2 Units at 01/23/21 2216   insulin aspart (novoLOG) injection 4 Units, 4 Units, Subcutaneous, TID WC, Newt Minion, MD, 4 Units at 01/27/21 0754   insulin glargine (LANTUS) injection 10 Units, 10 Units, Subcutaneous, BID, Newt Minion, MD, 10 Units at 01/27/21 1107   ipratropium-albuterol (DUONEB) 0.5-2.5 (3) MG/3ML nebulizer solution 3 mL, 3 mL, Nebulization, BID PRN, Regalado, Belkys A, MD   isosorbide mononitrate (IMDUR) 24 hr tablet 30 mg, 30 mg, Oral, Daily, Meridee Score V, MD, 30 mg at 01/27/21 1053   labetalol (NORMODYNE) injection 10 mg, 10 mg, Intravenous, Q10 min PRN, Sharol Given,  Illene Regulus, MD   lactated ringers infusion, , Intravenous, Continuous, Newt Minion, MD, Last Rate: 10 mL/hr at 01/21/21 0946, New Bag at 01/21/21 0946   magnesium sulfate IVPB 2 g 50 mL, 2 g, Intravenous, Daily PRN, Newt Minion, MD   metoCLOPramide (REGLAN) injection 5 mg, 5 mg, Intravenous, Q6H, Regalado, Belkys A, MD, 5 mg at 01/27/21 0640   metoprolol  tartrate (LOPRESSOR) injection 2-5 mg, 2-5 mg, Intravenous, Q2H PRN, Newt Minion, MD   morphine 2 MG/ML injection 0.5-1 mg, 0.5-1 mg, Intravenous, Q2H PRN, Newt Minion, MD, 1 mg at 01/22/21 8115   multivitamin with minerals tablet 1 tablet, 1 tablet, Oral, Daily, Newt Minion, MD, 1 tablet at 01/27/21 1053   nutrition supplement (JUVEN) (JUVEN) powder packet 1 packet, 1 packet, Oral, BID BM, Newt Minion, MD, 1 packet at 01/27/21 1108   ondansetron [facility]) injection 4 mg, 4 mg, Intravenous, Q6H PRN, Newt Minion, MD, 4 mg at 01/24/21 0920   pantoprazole (PROTONIX) EC tablet 40 mg, 40 mg, Oral, BID, Newt Minion, MD, 40 mg at 01/27/21 1053   penicillin G potassium 12 Million Units in dextrose 5 % 250 mL continuous infusion, 12 Million Units, Intravenous, Q12H, Vu, Trung T, MD, Last Rate: 20.8 mL/hr at 01/27/21 0157, 12 Million Units at 01/27/21 0157   phenol (CHLORASEPTIC) mouth spray 1 spray, 1 spray, Mouth/Throat, PRN, Newt Minion, MD   polyethylene glycol (MIRALAX / GLYCOLAX) packet 17 g, 17 g, Oral, Daily PRN, Newt Minion, MD   potassium chloride SA (KLOR-CON) CR tablet 20-40 mEq, 20-40 mEq, Oral, Daily PRN, Newt Minion, MD   povidone-iodine 10 % swab 2 application, 2 application, Topical, Once, Newt Minion, MD   sacubitril-valsartan (ENTRESTO) 24-26 mg per tablet, 1 tablet, Oral, BID, Joette Catching, PA-C, 1 tablet at 01/27/21 1107   senna (SENOKOT) tablet 8.6 mg, 1 tablet, Oral, BID, Newt Minion, MD, 8.6 mg at 01/27/21 1054   sodium chloride flush (NS) 0.9 % injection 10-40 mL, 10-40 mL, Intracatheter, Q12H, Newt Minion, MD, 10 mL at 01/27/21 1108   sodium chloride flush (NS) 0.9 % injection 10-40 mL, 10-40 mL, Intracatheter, PRN, Newt Minion, MD, 10 mL at 01/25/21 2225   spironolactone (ALDACTONE) tablet 12.5 mg, 12.5 mg, Oral, Daily, Newt Minion, MD, 12.5 mg at 01/27/21 1053   torsemide (DEMADEX) tablet 40 mg, 40 mg, Oral, Daily, Joette Catching, PA-C, 40 mg at 01/27/21 1052   zinc sulfate capsule 220 mg, 220 mg, Oral, Daily, Newt Minion, MD, 220 mg at 01/27/21 1052   Patients Current Diet:  Diet Order                  Diet - low sodium heart healthy             Diet Carb Modified Fluid consistency: Thin; Room service appropriate? Yes  Diet effective now                         Precautions / Restrictions Precautions Precautions: Fall, Other (comment) Precaution Comments: LLE wound vac Other Brace: L BKA shrinker and limb protector Restrictions Weight Bearing Restrictions: Yes LLE Weight Bearing: Non weight bearing    Has the patient had 2 or more falls or a fall with injury in the past year? Yes   Prior Activity Level Community (5-7x/wk): Active in the community PTA   Prior Functional  Level Self Care: Did the patient need help bathing, dressing, using the toilet or eating? Independent   Indoor Mobility: Did the patient need assistance with walking from room to room (with or without device)? Independent   Stairs: Did the patient need assistance with internal or external stairs (with or without device)? Needed some help   Functional Cognition: Did the patient need help planning regular tasks such as shopping or remembering to take medications? Needed some help   Home Assistive Devices / Equipment Home Equipment: Kasandra Knudsen - single point   Prior Device Use: Indicate devices/aids used by the patient prior to current illness, exacerbation or injury? None of the above   Current Functional Level Cognition   Overall Cognitive Status: No family/caregiver present to determine baseline cognitive functioning Current Attention Level: Selective Orientation Level: Oriented X4 Following Commands: Follows one step commands with increased time Safety/Judgement: Decreased awareness of safety, Decreased awareness of deficits General Comments: Pt slightly impulsive today; frequently using Bipap to 'recover' (pt reports  due to "losing my breath" but seems to be more related to anxiety); multimodal cues for sequencing, problem solving and safety; pt making moaning noises throughout session but stating "I'm fine"    Extremity Assessment (includes Sensation/Coordination)   Upper Extremity Assessment: Generalized weakness (red grips in room on silverware)  Lower Extremity Assessment: Defer to PT evaluation RLE Deficits / Details: Chronic leg wounds with bandaging noted LLE Deficits / Details: Chronic leg wounds with bandaging noted all around foot     ADLs   Overall ADL's : Needs assistance/impaired Eating/Feeding: Independent, Sitting Grooming: Wash/dry face, Wash/dry hands, Set up, Sitting Grooming Details (indicate cue type and reason): on BSC during toileting Upper Body Bathing: Set up, Sitting Lower Body Bathing: Maximal assistance, Sitting/lateral leans Upper Body Dressing : Minimal assistance, Sitting Lower Body Dressing: Maximal assistance, Sitting/lateral leans Lower Body Dressing Details (indicate cue type and reason): to don sock on RLE - maybe benefit from sock aide? Toilet Transfer: Min guard, Cueing for safety, Squat-pivot (lateral scoot transfer) Toilet Transfer Details (indicate cue type and reason): to the right to [facility] squat pivot/lateral scoot Toileting- Clothing Manipulation and Hygiene: Maximal assistance, Sit to/from stand, +2 for safety/equipment Toileting - Clothing Manipulation Details (indicate cue type and reason): Pt able to come to standing with max A, then 2nd therapist provided peri care while Pt maintained standing for approx 2 min Functional mobility during ADLs:  (squat pivot) General ADL Comments: significant decline in ability to complete ADL tasks s/p BKA. Educated on strategies for LB ADLs via lateral leans or in bed to maximize independence and decrease fall risk (while also progressing standing balance). Educated on built up grip use for utensils during meals with pt  reporting "this will work" but declined to actually attempt task     Mobility   Overal bed mobility: Needs Assistance Bed Mobility: Supine to Sit Supine to sit: Min guard, HOB elevated Sit to supine: Supervision General bed mobility comments: Increased time and effort, heavy use of bed rail; assist for lines     Transfers   Overall transfer level: Needs assistance Equipment used: None, Rolling walker (2 wheeled) Transfers: Lateral/Scoot Transfers, Sit to/from Stand Sit to Stand: Mod assist, +2 safety/equipment Stand pivot transfers: Min assist, Min guard, +2 safety/equipment Squat pivot transfers: Min assist, +2 safety/equipment  Lateral/Scoot Transfers: Min assist, +2 physical assistance, From elevated surface General transfer comment: Pt with urgency to have BM requiring max verbal cues for safety/lines prior to performing lateral scoot to  drop-arm BSC, minA for stability (assist for set-up and lines); additional stand from [facility] with modA for trunk elevation and stability, heavy reliance on momentum and UE support to power into standing; poor eccentric control into sitting as pt quick to fatigue and attempting to sit prematurely requiring modA+2 for safe lowering; increased time to recover after each transfer, pt anxiously asking for bipap "to catch my breath"     Ambulation / Gait / Stairs / Wheelchair Mobility   Ambulation/Gait Ambulation/Gait assistance: Mod assist, +2 safety/equipment Gait Distance (Feet): 2 Feet Assistive device: Rolling walker (2 wheeled) Gait Pattern/deviations: Step-through pattern, Decreased stride length General Gait Details: Pt able to take 3-4 complete hops on RLE with RW and modA for stability; quick to fatigue reverting to scooting pivot on RLE with RW in order to complete transfer to recliner; max verbal cues for sequencing and safety as pt attempting to sit prematurely Gait velocity: decreased Gait velocity interpretation: 1.31 - 2.62 ft/sec, indicative of  limited community ambulator     Posture / Balance Dynamic Sitting Balance Sitting balance - Comments: Static sitting with supervision, able to perform lateral weight shifts without LOB Balance Overall balance assessment: Needs assistance Sitting-balance support: No upper extremity supported, Feet supported Sitting balance-Leahy Scale: Good Sitting balance - Comments: Static sitting with supervision, able to perform lateral weight shifts without LOB Standing balance support: Bilateral upper extremity supported Standing balance-Leahy Scale: Poor Standing balance comment: Reliant on BUE support and external assist; dependent for posterior pericare while standing     Special needs/care consideration Wound Vac in place    Previous Home Environment (from acute therapy documentation) Living Arrangements: Spouse/significant other, Children  Lives With: Family Available Help at Discharge: Family, Available PRN/intermittently Type of Home: House Home Layout: Two level, Able to live on main level with bedroom/bathroom, Full bath on main level Home Access: Stairs to enter Entrance Stairs-Rails: None Entrance Stairs-Number of Steps: 3 Bathroom Shower/Tub: Chiropodist: Standard Bathroom Accessibility: Yes How Accessible: Accessible via walker Additional Comments: Pt reports family will not assist him much.   Discharge Living Setting Plans for Discharge Living Setting: [name] home Type of Home at Discharge: House Discharge Home Layout: Two level, Able to live on main level with bedroom/bathroom Alternate Level Stairs-Number of Steps: full flight Discharge Home Access: Stairs to enter Entrance Stairs-Rails: None Entrance Stairs-Number of Steps: 3 Discharge Bathroom Shower/Tub: Tub/shower unit, Walk-in shower Discharge Bathroom Toilet: Standard Discharge Bathroom Accessibility: Yes How Accessible: Accessible via walker   Social/Family/Support Systems Patient Roles:  Spouse Contact Information: [phone] Anticipated Caregiver: Lauro Regulus (signiciant other) Anticipated Caregiver's Contact Information: [phone] Ability/Limitations of Caregiver: Family can provide supervision only Caregiver Availability: 24/7 Discharge Plan Discussed with Primary Caregiver: Yes Is Caregiver In Agreement with Plan?: Yes Does Caregiver/Family have Issues with Lodging/Transportation while Pt is in Rehab?: No   Goals Patient/Family Goal for Rehab: PT/OT Supervision Expected length of stay: 14-16 days Pt/Family Agrees to Admission and willing to participate: Yes Program Orientation Provided & Reviewed with Pt/Caregiver Including Roles  & Responsibilities: Yes   Decrease burden of Care through IP rehab admission: Specialzed equipment needs, Decrease number of caregivers, Bowel and bladder program, and Patient/family education   Possible need for SNF placement upon discharge: not anticipated   Patient Condition: I have reviewed medical records from [facility], spoken with CM, and patient. I met with patient at the bedside for inpatient rehabilitation assessment.  Patient will benefit from ongoing PT and OT, can actively participate  in 3 hours of therapy a day 5 days of the week, and can make measurable gains during the admission.  Patient will also benefit from the coordinated team approach during an Inpatient Acute Rehabilitation admission.  The patient will receive intensive therapy as well as Rehabilitation physician, nursing, social worker, and care management interventions.  Due to safety, skin/wound care, disease management, medication administration, pain management, and patient education the patient requires 24 hour a day rehabilitation nursing.  The patient is currently mod+2  with mobility and basic ADLs.  Discharge setting and therapy post discharge at home with home health is anticipated.  Patient has agreed to participate in the Acute Inpatient  Rehabilitation Program and will admit today.   Preadmission Screen Completed By:  Clemens Catholic, SLP [facility] with updates by Retta Diones, 01/27/2021 11:12 AM ______________________________________________________________________   Discussed status with Dr. Naaman Plummer  on [id number] at 29 and received approval for admission today.   Admission Coordinator:  Retta Diones, RN, time 165 /Date 01/27/2021    Assessment/Plan: Diagnosis: left bka Does the need for close, 24 hr/day Medical supervision in concert with the patient's rehab needs make it unreasonable for this patient to be served in a less intensive setting? Yes Co-Morbidities requiring supervision/potential complications: DM, COPD/OSA, HTN Due to bladder management, bowel management, safety, skin/wound care, disease management, medication administration, pain management, and patient education, does the patient require 24 hr/day rehab nursing? Yes Does the patient require coordinated care of a physician, rehab nurse, PT, OT to address physical and functional deficits in the context of the above medical diagnosis(es)? Yes Addressing deficits in the following areas: balance, endurance, locomotion, strength, transferring, bowel/bladder control, bathing, dressing, feeding, grooming, toileting, and psychosocial support Can the patient actively participate in an intensive therapy program of at least 3 hrs of therapy 5 days a week? Yes The potential for patient to make measurable gains while on inpatient rehab is excellent Anticipated functional outcomes upon discharge from inpatient rehab: supervision PT, supervision OT, n/a SLP Estimated rehab length of stay to reach the above functional goals is: 14-16 days Anticipated discharge destination: Home 10. Overall Rehab/Functional Prognosis: excellent     MD Signature: Meredith Staggers, MD, [place] [facility] 01/27/2021           Revision History                                                  Note Details  Author Meredith Staggers, MD File Time 01/27/2021 11:19 AM  Author Type Physician Status Signed  Last Editor Meredith Staggers, MD Service Nursing

## 2021-01-27 NOTE — Progress Notes (Signed)
Inpatient Rehabilitation Medication Review by a Pharmacist  A complete drug regimen review was completed for this patient to identify any potential clinically significant medication issues.  Clinically significant medication issues were identified:  yes   Type of Medication Issue Identified Description of Issue Urgent (address now) Non-Urgent (address on AM team rounds) Plan Plan Accepted by Provider? (Yes / No / Pending AM Rounds)  Drug Interaction(s) (clinically significant)       Duplicate Therapy       Allergy       No Medication Administration End Date       Incorrect Dose       Additional Drug Therapy Needed  Robaxin to be added for spasms per Dr Riley Kill note but not ordered  Follow up at AM rounds    Other         Name of provider notified for urgent issues identified:   Provider Method of Notification:    For non-urgent medication issues to be resolved on team rounds tomorrow morning a [facility] Secure Chat Handoff was sent to:    Pharmacist comments:   Time spent performing this drug regimen review (minutes):  20   Madilyn Cephas 01/27/2021 7:28 PM

## 2021-01-27 NOTE — Progress Notes (Signed)
ID Brief Note  Chart reviewed Patient continues to remain afebrile WBC is downtrending  18.6>15.2  Patient is discharged today to inpatient rehab No new recommendations ID will sign off for now. Please call with questions  Odette Fraction, MD Infectious Disease Physician [facility] [address] [place], [place] [address] Phone: [phone]  Fax: [phone]

## 2021-01-27 NOTE — Evaluation (Signed)
Occupational Therapy Assessment and Plan  Patient Details  Name: Richard [name] MRN: [id number] Date of Birth: 06-Jan-1961  OT Diagnosis: abnormal posture, acute pain, cognitive deficits, muscle weakness (generalized), and impaired sensation Rehab Potential: Rehab Potential (ACUTE ONLY): Good ELOS:   14-16 days  Today's Date: 01/28/2021 OT Individual Time: 4656-8127 and 5170-0174 OT Individual Time Calculation (min): 55 min  and 57 min  Hospital Problem: Active Problems:   Below-knee amputation of left lower extremity [facility])   Past Medical History:  Past Medical History:  Diagnosis Date   AICD (automatic cardioverter/defibrillator) present 01/11/2017   Anxiety    CHF (congestive heart failure) (HCC)    CKD (chronic kidney disease)    COPD (chronic obstructive pulmonary disease) ([place])    never smoker, industrial exposure   Diabetes mellitus with diabetic neuropathy, with long-term current use of insulin (HCC)    GERD (gastroesophageal reflux disease)    High cholesterol    History of gout    "take RX qd" (11/01/2017)   Hypertension    Nonobstructive atherosclerosis of coronary artery    On home oxygen therapy    "2L prn" (11/01/2017)   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, present for years   Systolic HF (heart failure) ([place])    Past Surgical History:  Past Surgical History:  Procedure Laterality Date   AMPUTATION Left 01/21/2021   Procedure: AMPUTATION BELOW KNEE;  Surgeon: Newt Minion, MD;  Location: [place];  Service: Orthopedics;  Laterality: Left;   APPLICATION OF WOUND VAC Left 01/21/2021   Procedure: APPLICATION OF WOUND VAC;  Surgeon: Newt Minion, MD;  Location: [place];  Service: Orthopedics;  Laterality: Left;   CATARACT EXTRACTION W/ INTRAOCULAR LENS IMPLANTW/ TRABECULECTOMY Left ~ 2013   "may have done this when I had my other eye OR"   [place] N/A 11/02/2017   Procedure: CORONARY STENT INTERVENTION;  Surgeon: Larey Dresser,  MD;  Location: [place] CV LAB;  Service: Cardiovascular;  Laterality: N/A;   CORONARY STENT INTERVENTION N/A 11/02/2017   Procedure: CORONARY STENT INTERVENTION;  Surgeon: Belva Crome, MD;  Location: [place] CV LAB;  Service: Cardiovascular;  Laterality: N/A;   EYE SURGERY Left ~ 2013   "fell on something in basement; stuck in my eye"   ICD IMPLANT     Medtronic Dual Chamber 01/11/17   MULTIPLE TOOTH EXTRACTIONS     "pulled all my top teeth"   RIGHT/LEFT HEART CATH AND CORONARY ANGIOGRAPHY N/A 11/02/2017   Procedure: RIGHT/LEFT HEART CATH AND CORONARY ANGIOGRAPHY;  Surgeon: Larey Dresser, MD;  Location: [place] CV LAB;  Service: Cardiovascular;  Laterality: N/A;   TEE WITHOUT CARDIOVERSION N/A 01/20/2021   Procedure: TRANSESOPHAGEAL ECHOCARDIOGRAM (TEE);  Surgeon: Larey Dresser, MD;  Location: [facility] ENDOSCOPY;  Service: Cardiovascular;  Laterality: N/A;    Assessment & Plan Clinical Impression:  Richard [name] is a 60 year old male with history of CKD, T2DM with neuropathy, LLL hamartoma, CKD III, COPD-2 L oxygen dependent, chronic combined CHF, chronic BLE ulcers who was admitted on 01/16/2019 with severe sepsis due to group G bacteremia and acute on chronic hypoxic respiratory failure due to CHF.  He was treated with IV diuresis as well as BiPAP with strict I's and O's recommended per cardiology.  He was started on broad-spectrum antibiotics with TEE for work-up per ID input.  TEE done on 07/28 showing EF 15 to 20% with severely dilated LV  and diffuse hypokinesis but was negative for vegetation, ASD, PFO and showed mild MR.  .   Left foot was suspected as source of infection and Dr. Sharol Given was consulted for input.Left foot CT showed swelling and soft tissue gas concerning for necrotizing infection and possibility of early osteomyelitis as well as nondisplaced calcaneal process fracture around lateral aspect.   He underwent left transtibial amputation on 07/29 and post op has had issues  with hiccups. As blood pressures stabilizing, cardiac medications resumed. 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  White count has slowly trended down and ID recommended narrowing antibiotics to pen G with recommendations to continue antibiotics for  2 additional days. CT chest done due to issues with shortness of breath and showed benign lesion as well as no acute findings.  PCCM consulted and reported that this was not a new finding as mass consistent with hematoma per prior biopsy and had been slowly growing.  They recommended having patient follow-up with [facility] pulmonology after discharge.  Patient continues to be limited by anxiety due to shortness of breath with minimal activity as well as debility.Marland Kitchen  CIR recommended due to functional decline.  Patient currently requires min- mod with basic self-care skills secondary to muscle weakness, decreased cardiorespiratoy endurance, decreased memory, and decreased standing balance, decreased postural control, and decreased balance strategies.  Prior to hospitalization, patient could complete BADLs with independent .  Patient will benefit from skilled intervention to increase independence with basic self-care skills prior to discharge  home with family .  Anticipate patient will require 24 hour supervision and follow up home health.  OT - End of Session Endurance Deficit: Yes Endurance Deficit Description: Pt required frequent rest breaks during self care activity, requested to return to bed at close of session vs transfer OOB OT Assessment Rehab Potential (ACUTE ONLY): Good OT Barriers to Discharge: Wound Care;Weight bearing restrictions OT Patient demonstrates impairments in the following area(s): Cognition;Endurance;Motor;Pain;Safety;Sensory;Skin Integrity OT Basic ADL's Functional Problem(s):  Grooming;Bathing;Dressing;Toileting OT Advanced ADL's Functional Problem(s): Simple Meal Preparation OT Transfers Functional Problem(s): Toilet;Tub/Shower OT Additional Impairment(s): Fuctional Use of Upper Extremity (pt reports being unable to handle money due to sensation deficits in hands, also unable to safely cook, unable to pull shirt down over trunk at this time without assistance) OT Plan OT Intensity: Minimum of 1-2 x/day, 45 to 90 minutes OT Frequency: 5 out of 7 days OT Treatment/Interventions: Balance/vestibular training;DME/adaptive equipment instruction;Patient/family education;Therapeutic Activities;Wheelchair propulsion/positioning;Therapeutic Exercise;Psychosocial support;Cognitive remediation/compensation;Community reintegration;Functional mobility training;Self Care/advanced ADL retraining;UE/LE Strength taining/ROM;Discharge planning;Skin care/wound managment;UE/LE Coordination activities;Disease mangement/prevention;Pain management OT Self Feeding Anticipated Outcome(s): No goal OT Basic Self-Care Anticipated Outcome(s): Supervision OT Toileting Anticipated Outcome(s): Supervision OT Bathroom Transfers Anticipated Outcome(s): Supervision OT Recommendation Recommendations for Other Services: Therapeutic Recreation consult Therapeutic Recreation Interventions: Other (comment) (anxiety mgt) Patient destination: Home Follow Up Recommendations: Home health OT Equipment Recommended: To be determined   OT Evaluation Precautions/Restrictions  Precautions Precautions: Fall Precaution Comments: Lt BKA with vac, PICC line, sensation deficits in B hands + Rt foot Required Braces or Orthoses: Other Brace Other Brace: L BKA shrinker and limb protector Restrictions Weight Bearing Restrictions: Yes LLE Weight Bearing: Touchdown weight bearing General PT Missed Treatment Reason: Patient ill (Comment) (orthostatic) Vital Signs Therapy Vitals Temp: 98 F (36.7 C) Pulse Rate:  78 Resp: 18 BP: 101/70 Patient Position (if appropriate): Lying  Oxygen Therapy SpO2: 93 % O2 Device: Room Air Pain Pain Assessment Pain Scale: 0-10 Pain Score: 8  Pain Location: Knee Pain Orientation: Left Home Living/Prior Functioning Home Living Available Help at Discharge: Family, Available PRN/intermittently (Simultaneous filing. User may not have seen previous data.) Type of Home: House (Simultaneous filing. User may not have seen previous data.) Home Access: Stairs to enter (Simultaneous filing. User may not have seen previous data.) Entrance Stairs-Number of Steps: 3 (Simultaneous filing. User may not have seen previous data.) Entrance Stairs-Rails: Can reach both Home Layout: Two level, Able to live on main level with bedroom/bathroom, Full bath on main level (Simultaneous filing. User may not have seen previous data.) Bathroom Shower/Tub: Tub/shower unit, Walk-in shower (has both in bathroom on main level) Bathroom Toilet: Standard Bathroom Accessibility: Yes Additional Comments: Pt reports family will not assist him much.  Lives With: Spouse, Daughter, Son, Other (Comment) (Simultaneous filing. User may not have seen previous data.) IADL History Homemaking Responsibilities: Yes (pt reported using the riding lawn mower PTA, also drove to get groceries, and paid the bills) Meal Prep Responsibility: No (pt reports loving to cook but sustaining several Eidem on his hands when cooking in the past due to putting hands on burners and not feeling it) Occupation: Retired Type of Occupation: Worked for [employer] Leisure and Hobbies: Fishing Prior Function Level of Independence: Independent with transfers, Independent with gait (Simultaneous filing. User may not have seen previous data.)  Able to Take Stairs?: Yes Driving: Yes (Simultaneous filing. 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 Drives. Vision Baseline Vision/History: No visual deficits (pt reports not using glasses but that he does get blurry vision when his blood sugar is high) Patient Visual Report: No change from baseline Perception  Perception: Within Functional Limits Praxis Praxis: Intact Cognition Overall Cognitive Status: No family/caregiver present to determine baseline cognitive functioning Arousal/Alertness: Awake/alert Orientation Level: Person;Place;Situation Person: Oriented Place: Oriented Situation: Oriented Year: Other (Comment) (per pt "I don't know" and would not take a guess) Month:  (pt reported "I don't know" and would not take a guess) Day of Week: Incorrect Memory: Impaired Immediate Memory Recall: Sock;Blue;Bed Memory Recall Sock: Without Cue Memory Recall Blue: With Cue Memory Recall Bed: With Cue Attention: Focused;Sustained Focused Attention: Appears intact Sustained Attention: Appears intact Awareness: Impaired Awareness Impairment: Intellectual impairment Problem Solving: Appears intact Safety/Judgment: Appears intact [employer] Scales of Cognitive Functioning: Localized response Sensation Sensation Light Touch: Impaired by gross assessment Light Touch Impaired Details: Absent RLE;Impaired RLE;Impaired RUE;Impaired LUE (pt reports/exhibits abnormal sensation in Rt LE, also impaired sensation in B hands) Hot/Cold: Appears Intact Proprioception: Impaired by gross assessment Coordination Gross Motor Movements are Fluid and Coordinated: No Fine Motor Movements are Fluid and Coordinated: Yes Coordination and Movement Description: Affected by altered center of mass due to amputation, pain, and sensation deficits Finger Nose Finger Test: not tested Motor  Motor Motor: Other (comment) Motor - Skilled Clinical Observations: Affected by altered center of mass and resulting balance deficits, pain, sensation deficits, and  generalized weakness/limited activity tolerance  Trunk/Postural Assessment  Cervical Assessment Cervical Assessment: Exceptions to [facility] (forward head) Thoracic Assessment Thoracic Assessment: Exceptions to [facility] (Rt trunk elongation in sitting) Lumbar Assessment Lumbar Assessment: Exceptions to [facility] (posterior pelvic tilt) Postural Control Postural Control: Deficits on evaluation (limited in standing due to amputation)  Balance Balance Balance Assessed: Yes Dynamic Sitting Balance Dynamic Sitting -  Balance Support: No upper extremity supported Dynamic Sitting - Level of Assistance: 4: Min assist (trying to remove his gripper sock) Sitting balance - Comments: Static sitting with supervision, able to perform lateral weight shifts without LOB Dynamic Standing Balance Dynamic Standing - Balance Support: Right upper extremity supported;During functional activity Dynamic Standing - Level of Assistance: 4: Min assist;3: Mod assist Dynamic Standing - Balance Activities: Forward lean/weight shifting;Lateral lean/weight shifting (elevating pants over hips) Extremity/Trunk Assessment RUE Assessment RUE Assessment: Within Functional Limits (+sensation deficits) LUE Assessment LUE Assessment: Within Functional Limits (+ sensation deficits)  Care Tool Care Tool Self Care Eating        Oral Care    Oral Care Assist Level: Set up assist    Bathing   Body parts bathed by patient: Right arm;Left arm;Chest;Abdomen;Front perineal area;Buttocks;Right upper leg;Face Body parts bathed by helper: Right lower leg Body parts n/a: Left upper leg;Left lower leg Assist Level: Minimal Assistance - Patient > 75%    Upper Body Dressing(including orthotics)   What is the patient wearing?: Pull over shirt   Assist Level: Minimal Assistance - Patient > 75%    Lower Body Dressing (excluding footwear)   What is the patient wearing?: Pants Assist for lower body dressing: Moderate Assistance - Patient 50 -  74%    Putting on/Taking off footwear   What is the patient wearing?: Non-skid slipper socks Assist for footwear: Maximal Assistance - Patient 25 - 49%       Care Tool Toileting Toileting activity Toileting Activity did not occur (Clothing management and hygiene only): N/A (no void or bm)       Care Tool Bed Mobility Roll left and right activity   Roll left and right assist level: Supervision/Verbal cueing    Sit to lying activity   Sit to lying assist level: Minimal Assistance - Patient > 75%    Lying to sitting edge of bed activity   Lying to sitting edge of bed assist level: Minimal Assistance - Patient > 75%     Care Tool Transfers Sit to stand transfer   Sit to stand assist level: Maximal Assistance - Patient 25 - 49%    Chair/bed transfer Chair/bed transfer activity did not occur: Safety/medical concerns (pt became orthostatic)       Toilet transfer Toilet transfer activity did not occur: N/A (not attempted due to time constraints)       Care Tool Cognition Expression of Ideas and Wants Expression of Ideas and Wants: Some difficulty - exhibits some difficulty with expressing needs and ideas (e.g, some words or finishing thoughts) or speech is not clear   Understanding Verbal and Non-Verbal Content Understanding Verbal and Non-Verbal Content: Usually understands - understands most conversations, but misses some part/intent of message. Requires cues at times to understand   Memory/Recall Ability *first 3 days only Memory/Recall Ability *first 3 days only: Location of own room;That he or she is in a hospital/hospital unit (could not recall month, year, or current president)    Refer to Care Plan for [place] 1 OT Short Term Goal 1 (Week 1): Pt will complete toilet or BSC transfer using LRAD and Min A OT Short Term Goal 2 (Week 1): Pt will maintain dynamic standing balance during LB ADL with no more than Min A during 2 consecutive  sessions OT Short Term Goal 3 (Week 1): Pt will state 2 strategies to impement at home during daily routine for diabetes management (i.e. diet,  daily limb inspection, etc)  Recommendations for other services: Therapeutic Recreation  Stress management and Other anxiety mgt    Skilled Therapeutic Intervention Skilled OT session completed with focus on initial evaluation, education on OT role/POC, and establishment of patient-centered goals.   Pt greeted while sitting EOB, stating he felt nauseated. Premedicated for nausea. OT provided him with an emesis bag at start of session, no emesis during tx. He was agreeable to engage in bathing/dressing tasks while sitting EOB, using lateral leans for LB bathing and sit<stand for LB dressing using RW. Supervision for dynamic sitting balance and Min balance assist in standing while he assisted with elevating pants over hips. Mod A for LB dressing, Min A for UB dressing. Pt has sensation deficits in B hands and Rt foot, deficits with pt being able to feel the wash cloth in hands and also fabric of clothing. He declined for OT to assist him with perihygiene thoroughness, appeared to reach buttocks well despite sensation deficits. 02 sats 93-95% on RA when assessed in the middle of session though pt visibly fatigued with activity, felt nauseated throughout tx. He opted to return to bed at close of session vs transfer to the recliner. Left him in care of RN to decide if he wanted pain medicine. Pt stated he had 5/10 pain in residual limb, also reports phantom pain/sensations.   2nd Session 1:1 tx (57 min) Pt greeted while sitting EOB, eating lunch and very anxious about his hiccups. Pt stated he couldn't breathe at times due to hiccups. 02 sats difficult to read but on Rt hand 93-99% on RA. Per pt, his neuropathy is worse on his Lt hand. Assistance needed for opening applesauce due to FMC/sensation deficits. Discussed low carb high protein diet for diabetes management.  He reported he has been implementing this diet for ~2 months. When he was finished his meal, pt once again declined OOB transfer, agreeable to continue session with education. OT provided a handout regarding pain mgt strategies for his residual limb including massaging/tapping and massaging limb with various textures. Also reviewed incisional massage to prevent adhesions with the understanding that this was only to be completed after incision healed, staples were removed, and MD okay'd incisional massage. OT brought in a full length mirror and began guiding him through LE exercises to improve phantom pain sensations. Pt able to tolerate minimally due to presence of hiccups. Very distracted by hiccups. OT provided him with mustard and pt reported hiccups absolved. He returned to bed with supervision assistance. OT provided him with lavender aromatherapy for the pillowcase to decrease feelings of anxiety that are present when he has bouts of hiccupping. All needs within reach and bed alarm set. RN aware of pts continuous hiccups, unable to provide medicine to address (pt asked).   ADL ADL Eating: Not assessed Grooming: Setup Where Assessed-Grooming: Edge of bed Upper Body Bathing: Supervision/safety Where Assessed-Upper Body Bathing: Edge of bed Lower Body Bathing: Minimal assistance Where Assessed-Lower Body Bathing: Edge of bed Upper Body Dressing: Minimal assistance Where Assessed-Upper Body Dressing: Edge of bed Lower Body Dressing: Moderate assistance Where Assessed-Lower Body Dressing: Edge of bed Toileting: Not assessed Toilet Transfer: Not assessed Tub/Shower Transfer: Not assessed Mobility  Bed Mobility Bed Mobility: Sit to Supine Supine to Sit: Minimal Assistance - Patient > 75% Sit to Supine: Minimal Assistance - Patient > 75% Transfers Sit to Stand: Maximal Assistance - Patient 25-49% Stand to Sit: Moderate Assistance - Patient 50-74%   Discharge Criteria: Patient will be  discharged from OT if patient refuses treatment 3 consecutive times without medical reason, if treatment goals not met, if there is a change in medical status, if patient makes no progress towards goals or if patient is discharged from hospital.  The above assessment, treatment plan, treatment alternatives and goals were discussed and mutually agreed upon: by patient  Skeet Simmer 01/28/2021, 11:51 AM

## 2021-01-27 NOTE — Progress Notes (Signed)
Inpatient Rehabilitation  Patient information reviewed and entered into eRehab system by Briyana Badman M. Dusti Tetro, M.A., CCC/SLP, PPS Coordinator.  Information including medical coding, functional ability and quality indicators will be reviewed and updated through discharge.    

## 2021-01-27 NOTE — Progress Notes (Signed)
IP rehab admissions - I met with patient at the bedside. He is agreeable to CIR.  I spoke with attending MD and I have clearance for discharge and re-admit to inpatient rehab today.  Call for questions.  [phone]

## 2021-01-27 NOTE — Discharge Summary (Signed)
Physician Discharge Summary  Richard [name] EAV:[id number] DOB: July 28, 1960 DOA: 01/15/2021  PCP: Ananias Pilgrim, MD  Admit date: 01/15/2021 Discharge date: 01/27/2021  Discharge disposition: Inpatient rehab (CIR)   Recommendations for Outpatient Follow-Up:   Follow-up with Dr. Lajoyce [name], orthopedic surgeon, in 1 week Follow-up with cardiologist on 02/16/2021.  Discharge Diagnosis:   Principal Problem:   Acute on chronic respiratory failure with hypoxia and hypercapnia (HCC) Active Problems:   Hypertension   CKD (chronic kidney disease)   Presence of implantable cardioverter-defibrillator (ICD)   GERD (gastroesophageal reflux disease)   Diabetes mellitus with diabetic neuropathy, with long-term current use of insulin (HCC)   COPD exacerbation (HCC)   Chronic systolic CHF (congestive heart failure) (HCC)   Acute renal failure superimposed on stage 3 chronic kidney disease (HCC)   Elevated troponin   Hyponatremia   Bacteremia   Chronic ulcer of left ankle (HCC)   Severe sepsis without septic shock (HCC)   Open wound   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HCC)   SOB (shortness of breath)   S/P BKA (below knee amputation), left (HCC)    Discharge Condition: Stable.  Diet recommendation:  Diet Order             Diet - low sodium heart healthy           Diet Carb Modified Fluid consistency: Thin; Room service appropriate? Yes  Diet effective now                     Code Status: Full Code     Hospital Course:   [name] is a 60 y.o. 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  He was admitted to the hospital for acute on  chronic combined systolic and diastolic CHF complicated by acute on chronic hypoxic respiratory failure.  He was treated with IV Lasix and he required BiPAP for acute respiratory failure.  2D echo showed EF estimated at 20 to 25%.  Cardiologist and pulmonologist were consulted to assist with management  He was also found to have severe sepsis secondary to group G strep bacteremia that was thought to be due to left heel ulcer with early changes of osteomyelitis.  Dr. Lajoyce [name], orthopedic surgeon was consulted and Richard underwent left BKA on 01/21/2021.  He was treated with empiric IV antibiotics.  ID was consulted to assist with management.  He was evaluated by PT and OT recommended further rehabilitation at the inpatient rehab center.  His condition has improved and he is deemed stable for discharge today.  Medical Consultants:   Cardiologist Pulmonologist Infectious disease Orthopedic surgeon   Discharge Exam:    Vitals:   01/27/21 0636 01/27/21 0640 01/27/21 0815 01/27/21 1127  BP:  91/73 94/61 98/76   Pulse:   (!) 56 75  Resp:   18 19  Temp:   (!) 97.5 F (36.4 C) 97.6 F (36.4 C)  TempSrc:   Oral Oral  SpO2:   91% 100%  Weight: 109.5 kg     Height:         GEN: NAD SKIN: Warm and dry EYES: No pallor or icterus ENT: MMM CV: RRR PULM: CTA B ABD: soft, obese,  NT, +BS CNS: AAO x 3, non focal EXT: Left BKA with dressing   The results of significant diagnostics from this hospitalization (including imaging, microbiology, ancillary and laboratory) are listed below for reference.     Procedures and Diagnostic Studies:   DG Chest Portable 1 View  Result Date: 01/15/2021 CLINICAL DATA:  60 year old male with shortness of breath. EXAM: PORTABLE ABDOMEN - 1 VIEW; PORTABLE CHEST - 1 VIEW COMPARISON:  Earlier radiograph dated 01/15/2021. FINDINGS: No focal consolidation, pleural effusion, pneumothorax. Cardiomegaly with mild central vascular congestion. Left pectoral pacemaker  device. No bowel dilatation or evidence of obstruction. No free air or radiopaque calculi. The osseous structures are intact. The soft tissues are unremarkable. IMPRESSION: 1. Cardiomegaly with mild central vascular congestion. 2. No evidence of bowel obstruction. Electronically Signed   By: Elgie Collard M.D.   On: 01/15/2021 21:32   DG Chest Port 1 View  Result Date: 01/15/2021 CLINICAL DATA:  Dyspnea. EXAM: PORTABLE CHEST 1 VIEW COMPARISON:  10/16/2020.  Chest CT, 12/17/2020. FINDINGS: Cardiac silhouette is mildly enlarged. No mediastinal or hilar masses. Rounded nodule at the left lung base is unchanged from the prior exams. Remainder of the lungs is clear. No pleural effusion or pneumothorax. Left anterior chest wall sequential pacemaker is stable. Skeletal structures are grossly intact. IMPRESSION: 1. No acute cardiopulmonary disease. 2. Left lower lobe pulmonary nodule is without significant change from the most recent prior exams. 3. Stable cardiomegaly. Electronically Signed   By: Amie Portland M.D.   On: 01/15/2021 16:23   DG Abd Portable 1V  Result Date: 01/15/2021 CLINICAL DATA:  60 year old male with shortness of breath. EXAM: PORTABLE ABDOMEN - 1 VIEW; PORTABLE CHEST - 1 VIEW COMPARISON:  Earlier radiograph dated 01/15/2021. FINDINGS: No focal consolidation, pleural effusion, pneumothorax. Cardiomegaly with mild central vascular congestion. Left pectoral pacemaker device. No bowel dilatation or evidence of obstruction. No free air or radiopaque calculi. The osseous structures are intact. The soft tissues are unremarkable. IMPRESSION: 1. Cardiomegaly with mild central vascular congestion. 2. No evidence of bowel obstruction. Electronically Signed   By: Elgie Collard M.D.   On: 01/15/2021 21:32   ECHOCARDIOGRAM COMPLETE  Result Date: 01/16/2021    ECHOCARDIOGRAM REPORT   Richard Name:   Richard [name] Date of Exam: 01/16/2021 Medical Rec #:  [id number]    Height:       77.0 in Accession #:     [id number]   Weight:       252.2 lb Date of Birth:  [birth year]   BSA:          2.469 m Richard Age:    59 years     BP:           95/60 mmHg Richard Gender: M            HR:           78 bpm. Exam Location:  Inpatient Procedure: 2D Echo, Cardiac Doppler and Color Doppler Indications:    chf  History:        Richard has prior history of Echocardiogram examinations, most                 recent 07/08/2020. CHF, Previous Myocardial Infarction,                 Defibrillator, COPD, Arrythmias:LBBB; Risk Factors:Hypertension,                 Former Smoker and Diabetes.  Sonographer:    Mikki Harbor  Referring Phys: [id number] ANASTASSIA DOUTOVA IMPRESSIONS  1. The anterior,anteroseptal,anterolateral walls are akinetic. The apex is akinetic. Marland Kitchen Left ventricular ejection fraction, by estimation, is 20 to 25%. The left ventricle has severely decreased function. The left ventricle demonstrates regional wall motion abnormalities (see scoring diagram/findings for description). The left ventricular internal cavity size was moderately to severely dilated. Left ventricular diastolic parameters are indeterminate.  2. RV poorly visualized. Grossly appears mildly enlarged with low normal to mildly decreased function. Right ventricular systolic function was not well visualized. The right ventricular size is not well visualized. Tricuspid regurgitation signal is inadequate for assessing PA pressure.  3. Left atrial size was mildly dilated.  4. The mitral valve is normal in structure. No evidence of mitral valve regurgitation. No evidence of mitral stenosis.  5. The aortic valve was not well visualized. Aortic valve regurgitation is not visualized. No aortic stenosis is present. FINDINGS  Left Ventricle: The anterior,anteroseptal,anterolateral walls are akinetic. The apex is akinetic. Left ventricular ejection fraction, by estimation, is 20 to 25%. The left ventricle has severely decreased function. The left ventricle demonstrates regional  wall motion abnormalities. Definity contrast agent was given IV to delineate the left ventricular endocardial borders. The left ventricular internal cavity size was moderately to severely dilated. There is no left ventricular hypertrophy. Left ventricular diastolic parameters are indeterminate. Right Ventricle: RV poorly visualized. Grossly appears mildly enlarged with low normal to mildly decreased function. The right ventricular size is not well visualized. Right vetricular wall thickness was not well visualized. Right ventricular systolic function was not well visualized. Tricuspid regurgitation signal is inadequate for assessing PA pressure. Left Atrium: Left atrial size was mildly dilated. Right Atrium: Right atrial size was normal in size. Pericardium: There is no evidence of pericardial effusion. Mitral Valve: The mitral valve is normal in structure. No evidence of mitral valve regurgitation. No evidence of mitral valve stenosis. MV peak gradient, 1.4 mmHg. The mean mitral valve gradient is 1.0 mmHg. Tricuspid Valve: The tricuspid valve is not well visualized. Tricuspid valve regurgitation is mild . No evidence of tricuspid stenosis. Aortic Valve: The aortic valve was not well visualized. Aortic valve regurgitation is not visualized. No aortic stenosis is present. Aortic valve mean gradient measures 3.0 mmHg. Aortic valve peak gradient measures 5.7 mmHg. Aortic valve area, by VTI measures 1.85 cm. Pulmonic Valve: The pulmonic valve was not well visualized. Pulmonic valve regurgitation is not visualized. No evidence of pulmonic stenosis. Aorta: The aortic root is normal in size and structure. IAS/Shunts: No atrial level shunt detected by color flow Doppler. Additional Comments: A device lead is visualized.  LEFT VENTRICLE PLAX 2D LVIDd:         6.80 cm  Diastology LVIDs:         6.00 cm  LV e' medial:   5.18 cm/s LV PW:         1.10 cm  LV E/e' medial: 10.3 LV IVS:        1.10 cm LVOT diam:     2.00 cm LV SV:          42 LV SV Index:   17 LVOT Area:     3.14 cm  RIGHT VENTRICLE RV Basal diam:  4.95 cm RV Mid diam:    6.10 cm RV S prime:     8.85 cm/s TAPSE (M-mode): 2.4 cm LEFT ATRIUM             Index       RIGHT ATRIUM  Index LA diam:        4.00 cm 1.62 cm/m  RA Area:     22.50 cm LA Vol (A2C):   75.9 ml 30.75 ml/m RA Volume:   63.00 ml  25.52 ml/m LA Vol (A4C):   70.3 ml 28.48 ml/m LA Biplane Vol: 73.5 ml 29.77 ml/m  AORTIC VALVE AV Area (Vmax):    1.94 cm AV Area (Vmean):   1.77 cm AV Area (VTI):     1.85 cm AV Vmax:           119.00 cm/s AV Vmean:          84.800 cm/s AV VTI:            0.226 m AV Peak Grad:      5.7 mmHg AV Mean Grad:      3.0 mmHg LVOT Vmax:         73.30 cm/s LVOT Vmean:        47.900 cm/s LVOT VTI:          0.133 m LVOT/AV VTI ratio: 0.59  AORTA Ao Root diam: 3.60 cm Ao Asc diam:  3.20 cm MITRAL VALVE MV Area (PHT): 4.15 cm    SHUNTS MV Area VTI:   2.29 cm    Systemic VTI:  0.13 m MV Peak grad:  1.4 mmHg    Systemic Diam: 2.00 cm MV Mean grad:  1.0 mmHg MV Vmax:       0.59 m/s MV Vmean:      36.9 cm/s MV Decel Time: 183 msec MV E velocity: 53.30 cm/s MV A velocity: 41.80 cm/s MV E/A ratio:  1.28 Dina RichJonathan Branch MD Electronically signed by Dina RichJonathan Branch MD Signature Date/Time: 01/16/2021/4:43:15 PM    Final    US EKG SITE RITE  Result Date: 01/16/2021 If Site Rite image not attached, placement could not be confirmed due to current cardiac rhythm.    Labs:   Basic Metabolic Panel: Recent Labs  Lab 01/23/21 0622 01/24/21 0539 01/25/21 0559 01/26/21 0848 01/27/21 0710  NA 132* 135 134* 130* 131*  K 4.8 4.5 4.9 4.6 4.7  CL 92* 92* 91* 89* 90*  CO2 33* 36* 35* 34* 33*  GLUCOSE 119* 90 76 110* 131*  BUN 43* 39* 36* 33* 39*  CREATININE 1.27* 1.21 1.42* 1.16 1.34*  CALCIUM 8.7* 8.4* 8.6* 8.4* 8.8*   GFR Estimated Creatinine Clearance: 82.7 mL/min (A) (by C-G formula based on SCr of 1.34 mg/dL (H)). Liver Function Tests: Recent Labs  Lab 01/21/21 0332   AST 24  ALT 24  ALKPHOS 83  BILITOT 0.7  PROT 7.9  ALBUMIN 2.0*   No results for input(s): LIPASE, AMYLASE in the last 168 hours. No results for input(s): AMMONIA in the last 168 hours. Coagulation profile No results for input(s): INR, PROTIME in the last 168 hours.  CBC: Recent Labs  Lab 01/23/21 0622 01/24/21 0539 01/25/21 0559 01/26/21 2300 01/27/21 0710  WBC 19.5* 18.0* 18.8* 18.6* 15.2*  NEUTROABS 15.8* 14.1* 14.6* 15.1* 12.2*  HGB 14.8 14.3 14.9 13.6 13.9  HCT 45.7 44.7 47.3 42.7 43.0  MCV 87.5 89.0 89.9 89.0 88.7  PLT 366 362 410* 370 378   Cardiac Enzymes: No results for input(s): CKTOTAL, CKMB, CKMBINDEX, TROPONINI in the last 168 hours. BNP: Invalid input(s): POCBNP CBG: Recent Labs  Lab 01/26/21 1104 01/26/21 1600 01/26/21 2110 01/27/21 0629 01/27/21 1126  GLUCAP 144* 147* 185* 110* 124*   D-Dimer No results for input(s): DDIMER in the  last 72 hours. Hgb A1c No results for input(s): HGBA1C in the last 72 hours. Lipid Profile No results for input(s): CHOL, HDL, LDLCALC, TRIG, CHOLHDL, LDLDIRECT in the last 72 hours. Thyroid function studies No results for input(s): TSH, T4TOTAL, T3FREE, THYROIDAB in the last 72 hours.  Invalid input(s): FREET3 Anemia work up No results for input(s): VITAMINB12, FOLATE, FERRITIN, TIBC, IRON, RETICCTPCT in the last 72 hours. Microbiology Recent Results (from the past 240 hour(s))  Surgical pcr screen     Status: Abnormal   Collection Time: 01/21/21 12:34 AM   Specimen: Nasal Mucosa; Nasal Swab  Result Value Ref Range Status   MRSA, PCR NEGATIVE NEGATIVE Final   Staphylococcus aureus POSITIVE (A) NEGATIVE Final    Comment: (NOTE) The Xpert SA Assay (FDA approved for NASAL specimens in patients 80 years of age and older), is one component of a comprehensive surveillance program. It is not intended to diagnose infection nor to guide or monitor treatment. Performed at [facility], [address],  [place], [place] [address]      Discharge Instructions:   Discharge Instructions     Bipap   Complete by: As directed    Use BiPAP at night   Diet - low sodium heart healthy   Complete by: As directed    Discharge wound care:   Complete by: As directed    Wash lower extremities with soap and water, rinse and pat gently dry.  Cover with Xeroform gauze, cover with silicone foam dressing.  Wrapped lower extremity from just below toes to just below knees with Ace bandage.  Change daily.   Increase activity slowly   Complete by: As directed    Negative Pressure Wound Therapy - Incisional   Complete by: As directed       Allergies as of 01/27/2021       Reactions   Aspirin Itching   Codeine Hives   Coconut Oil Itching        Medication List     STOP taking these medications    digoxin 0.125 MG tablet Commonly known as: LANOXIN   eplerenone 50 MG tablet Commonly known as: INSPRA   icosapent Ethyl 1 g capsule Commonly known as: Vascepa   isosorbide dinitrate 20 MG tablet Commonly known as: ISORDIL   omeprazole 20 MG capsule Commonly known as: PRILOSEC   sacubitril-valsartan 97-103 MG Commonly known as: ENTRESTO Replaced by: sacubitril-valsartan 24-26 MG       TAKE these medications    acetaminophen 650 MG CR tablet Commonly known as: TYLENOL Take 650 mg by mouth every 8 (eight) hours as needed for pain.   albuterol 108 (90 Base) MCG/ACT inhaler Commonly known as: VENTOLIN HFA Inhale 1-2 puffs into the lungs every 4 (four) hours as needed for wheezing or shortness of breath.   allopurinol 100 MG tablet Commonly known as: ZYLOPRIM Take 1 tablet (100 mg total) by mouth daily.   aspirin 81 MG chewable tablet Chew 81 mg by mouth daily.   atorvastatin 40 MG tablet Commonly known as: LIPITOR TAKE ONE TABLET BY MOUTH DAILY AT 6PM What changed:  how much to take how to take this when to take this additional instructions   bisoprolol 5 MG tablet Commonly  known as: ZEBETA Take 2.5 mg by mouth daily.   Breo Ellipta 100-25 MCG/INH Aepb Generic drug: fluticasone furoate-vilanterol Inhale 1 puff into the lungs daily.   busPIRone 5 MG tablet Commonly known as: BUSPAR Take 5 mg by mouth  2 (two) times daily.   cefadroxil 1 g tablet Commonly known as: DURICEF Take 1 tablet (1 g total) by mouth 2 (two) times daily for 2 days.   docusate sodium 100 MG capsule Commonly known as: COLACE Take 100 mg by mouth daily as needed for mild constipation.   DULoxetine 60 MG capsule Commonly known as: CYMBALTA Take 60 mg by mouth daily.   fenofibrate 145 MG tablet Commonly known as: TRICOR TAKE ONE TABLET BY MOUTH DAILY   hydrALAZINE 50 MG tablet Commonly known as: APRESOLINE Take 1.5 tablets (75 mg total) by mouth 3 (three) times daily.   HYDROcodone-acetaminophen 5-325 MG tablet Commonly known as: NORCO/VICODIN Take 1-2 tablets by mouth every 6 (six) hours as needed for moderate pain (pain score 4-6).   Insulin Pen Needle 30G X 8 MM Misc Commonly known as: NOVOFINE Inject 10 each into the skin as needed.   insulin regular human CONCENTRATED 500 UNIT/ML KwikPen Commonly known as: HUMULIN R Inject 0-400 Units into the skin as directed. Sliding   isosorbide mononitrate 30 MG 24 hr tablet Commonly known as: IMDUR Take 1 tablet (30 mg total) by mouth daily.   Jardiance 25 MG Tabs tablet Generic drug: empagliflozin Take 25 mg by mouth daily.   MAGnesium-Oxide 400 (241.3 Mg) MG tablet Generic drug: magnesium oxide Take 1 tablet (400 mg total) by mouth daily.   multivitamin with minerals Tabs tablet Take 1 tablet by mouth daily.   nutrition supplement (JUVEN) Pack Take 1 packet by mouth 2 (two) times daily between meals.   polyethylene glycol 17 g packet Commonly known as: MIRALAX / GLYCOLAX Take 17 g by mouth daily as needed for mild constipation.   pregabalin 150 MG capsule Commonly known as: LYRICA Take 150 mg by mouth 3  (three) times daily.   sacubitril-valsartan 24-26 MG Commonly known as: ENTRESTO Take 1 tablet by mouth 2 (two) times daily. Replaces: sacubitril-valsartan 97-103 MG   spironolactone 25 MG tablet Commonly known as: ALDACTONE Take 0.5 tablets (12.5 mg total) by mouth daily.   SSD 1 % cream Generic drug: silver sulfADIAZINE Apply 1 application topically daily as needed for wound care.   Torsemide 40 MG Tabs Take 40 mg by mouth daily. What changed: See the new instructions.   zinc sulfate 220 (50 Zn) MG capsule Take 1 capsule (220 mg total) by mouth daily.               Durable Medical Equipment  (From admission, onward)           Start     Ordered   01/24/21 1434  For home use only DME high strength lightweight manual wheelchair with seat cushion  Once       Comments: Richard suffers from left BKA which impairs their ability to perform daily activities like bathing, dressing, feeding, grooming, and toileting in the home.  A walker will not resolve  issue with performing activities of daily living. A wheelchair will allow Richard to safely perform daily activities.Length of need Lifetime.  Richard self-propels the wheelchair while engaging in frequent activities such as laundry, meals, and toileting which cannot be performed in a standard or lightweight wheelchair due to the weight of the chair. Accessories: elevating leg rests (ELRs), wheel locks, extensions and anti-tippers, back cushion   01/24/21 1441   01/24/21 1434  For home use only DME 3 n 1  Once        01/24/21 1441  Discharge Care Instructions  (From admission, onward)           Start     Ordered   01/26/21 0000  Discharge wound care:       Comments: Wash lower extremities with soap and water, rinse and pat gently dry.  Cover with Xeroform gauze, cover with silicone foam dressing.  Wrapped lower extremity from just below toes to just below knees with Ace bandage.  Change daily.    01/26/21 1251            Follow-up Information     Nadara Mustard, MD Follow up in 1 week(s).   Specialty: Orthopedic Surgery Contact information: [address] [place] [address] [phone]         [facility] Follow up on 02/16/2021.   Specialty: Cardiology Why: at 1030 Contact information: [name] [id number] [place] [address] [phone]                 Time coordinating discharge: 36 minutes  Signed:  Lurene Shadow  [facility] 01/27/2021, 11:44 AM   Pager on [email or web address]. If 7PM-7AM, please contact night-coverage at [email or web address]

## 2021-01-27 NOTE — H&P (Signed)
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  He was treated with IV diuresis as well as BiPAP with strict I's and O's recommended per cardiology.  He was started on broad-spectrum antibiotics with TEE for work-up per ID input.  TEE done on 07/28 showing EF 15 to 20% with severely dilated LV and diffuse hypokinesis but was negative for vegetation, ASD, PFO and showed mild MR.  .   Left foot was suspected as source of infection and Dr. Sharol Given was consulted for input.Left foot CT showed swelling and soft tissue gas concerning for necrotizing infection and possibility of early osteomyelitis as well as nondisplaced calcaneal process fracture around lateral aspect.   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 As blood pressures stabilizing, cardiac medications resumed. 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  White count has slowly trended down and ID recommended narrowing antibiotics to pen G with recommendations to continue antibiotics for  2 additional days. CT chest done due to issues with shortness of breath and showed benign lesion as well as no acute findings.  PCCM consulted and reported that this was not a new finding as mass consistent with hematoma per prior biopsy and had been slowly growing.  They recommended having patient follow-up with [facility] pulmonology after discharge.  Patient continues to be  limited by anxiety due to shortness of breath with minimal activity as well as debility.Marland Kitchen  CIR recommended due to functional decline.  Review of Systems  Constitutional:  Positive for malaise/fatigue. Negative for chills and fever.  HENT:  Negative for ear discharge and ear pain.   Eyes:  Negative for blurred vision and double vision.  Respiratory:  Positive for shortness of breath. Negative for cough and hemoptysis.   Cardiovascular:  Negative for chest pain and palpitations.  Gastrointestinal:  Negative for nausea and vomiting.  Genitourinary:  Negative for frequency and urgency.  Musculoskeletal:  Positive for joint pain and myalgias.  Neurological:  Positive for weakness. Negative for dizziness.  Psychiatric/Behavioral:  Negative for depression and suicidal ideas.    Past Medical History:  Diagnosis Date   AICD (automatic cardioverter/defibrillator) present 01/11/2017   Anxiety    CHF (congestive heart failure) (HCC)    CKD (chronic kidney disease)    COPD (chronic obstructive pulmonary disease) ([place])    never smoker, industrial exposure   Diabetes mellitus with diabetic neuropathy, with long-term current use of insulin (HCC)    GERD (gastroesophageal reflux disease)    High cholesterol    History of gout    "take RX qd" (11/01/2017)   Hypertension    Nonobstructive atherosclerosis of coronary artery    On home oxygen therapy    "2L prn" (11/01/2017)   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[place])    LLL, present for years   Systolic HF (heart failure) ([place])    Past Surgical History:  Procedure Laterality Date  AMPUTATION Left 01/21/2021   Procedure: AMPUTATION BELOW KNEE;  Surgeon: Newt Minion, MD;  Location: [place];  Service: Orthopedics;  Laterality: Left;   APPLICATION OF WOUND VAC Left 01/21/2021   Procedure: APPLICATION OF WOUND VAC;  Surgeon: Newt Minion, MD;  Location: [place];  Service: Orthopedics;  Laterality: Left;   CATARACT EXTRACTION W/ INTRAOCULAR LENS IMPLANTW/  TRABECULECTOMY Left ~ 2013   "may have done this when I had my other eye OR"   [place] N/A 11/02/2017   Procedure: CORONARY STENT INTERVENTION;  Surgeon: Larey Dresser, MD;  Location: [place] [facility];  Service: Cardiovascular;  Laterality: N/A;   CORONARY STENT INTERVENTION N/A 11/02/2017   Procedure: CORONARY STENT INTERVENTION;  Surgeon: Belva Crome, MD;  Location: [place] [facility];  Service: Cardiovascular;  Laterality: N/A;   EYE SURGERY Left ~ 2013   "fell on something in basement; stuck in my eye"   ICD IMPLANT     Medtronic Dual Chamber 01/11/17   MULTIPLE TOOTH EXTRACTIONS     "pulled all my top teeth"   RIGHT/LEFT HEART CATH AND CORONARY ANGIOGRAPHY N/A 11/02/2017   Procedure: RIGHT/LEFT HEART CATH AND CORONARY ANGIOGRAPHY;  Surgeon: Larey Dresser, MD;  Location: [place] [facility];  Service: Cardiovascular;  Laterality: N/A;   TEE WITHOUT CARDIOVERSION N/A 01/20/2021   Procedure: TRANSESOPHAGEAL ECHOCARDIOGRAM (TEE);  Surgeon: Larey Dresser, MD;  Location: [facility] ENDOSCOPY;  Service: Cardiovascular;  Laterality: N/A;   Family History  Problem Relation Age of Onset   Hypertension Mother    Diabetes Mother    Hypertension Father    Diabetes Father    Diabetes Sister    Diabetes Brother    Social History:  reports that he has quit smoking. His smoking use included cigarettes. He has never used smokeless tobacco. He reports previous alcohol use. He reports that he does not use drugs. Allergies:  Allergies  Allergen Reactions   Aspirin Itching   Codeine Hives   Coconut Oil Itching   Medications Prior to Admission  Medication Sig Dispense Refill   acetaminophen (TYLENOL) 650 MG CR tablet Take 650 mg by mouth every 8 (eight) hours as needed for pain.     albuterol (VENTOLIN HFA) 108 (90 Base) MCG/ACT inhaler Inhale 1-2 puffs into the lungs every 4 (four) hours as needed for wheezing or shortness of breath. 8 g 1   allopurinol  (ZYLOPRIM) 100 MG tablet Take 1 tablet (100 mg total) by mouth daily. 30 tablet 3   aspirin 81 MG chewable tablet Chew 81 mg by mouth daily.     atorvastatin (LIPITOR) 40 MG tablet TAKE ONE TABLET BY MOUTH DAILY AT 6PM 90 tablet 0   bisoprolol (ZEBETA) 5 MG tablet Take 2.5 mg by mouth daily.     BREO ELLIPTA 100-25 MCG/INH AEPB Inhale 1 puff into the lungs daily.     busPIRone (BUSPAR) 5 MG tablet Take 5 mg by mouth 2 (two) times daily.     docusate sodium (COLACE) 100 MG capsule Take 100 mg by mouth daily as needed for mild constipation.     DULoxetine (CYMBALTA) 60 MG capsule Take 60 mg by mouth daily.  2   eplerenone (INSPRA) 50 MG tablet Take 1 tablet (50 mg total) by mouth daily. 30 tablet 6   fenofibrate (TRICOR) 145 MG tablet TAKE ONE TABLET BY MOUTH DAILY 30 tablet 1   hydrALAZINE (APRESOLINE) 50 MG tablet Take 1.5  tablets (75 mg total) by mouth 3 (three) times daily. 135 tablet 3   insulin regular human CONCENTRATED (HUMULIN R) 500 UNIT/ML kwikpen Inject 0-400 Units into the skin as directed. Sliding     JARDIANCE 25 MG TABS tablet Take 25 mg by mouth daily. 30 tablet 6   MAGNESIUM-OXIDE 400 (241.3 Mg) MG tablet Take 1 tablet (400 mg total) by mouth daily. 30 tablet 11   pregabalin (LYRICA) 150 MG capsule Take 150 mg by mouth 3 (three) times daily.     sacubitril-valsartan (ENTRESTO) 97-103 MG Take 1 tablet by mouth 2 (two) times daily. 180 tablet 3   SSD 1 % cream Apply 1 application topically daily as needed for wound care.     [DISCONTINUED] Torsemide 40 MG TABS Take 80 mg by mouth in the morning AND 60 mg every evening. 105 tablet 3   icosapent Ethyl (VASCEPA) 1 g capsule Take 2 capsules (2 g total) by mouth 2 (two) times daily. (Patient not taking: No sig reported) 120 capsule 11   Insulin Pen Needle (NOVOFINE) 30G X 8 MM MISC Inject 10 each into the skin as needed. 100 each 0   isosorbide dinitrate (ISORDIL) 20 MG tablet TAKE TWO TABLETS BY MOUTH THREE TIMES A DAY 90 tablet 2    omeprazole (PRILOSEC) 20 MG capsule TAKE ONE CAPSULE BY MOUTH DAILY (Patient not taking: No sig reported) 30 capsule 0    Drug Regimen Review  Drug regimen was reviewed and remains appropriate with no significant issues identified  Home: Home Living Family/patient expects to be discharged to:: Private residence Living Arrangements: Spouse/significant other, Children Available Help at Discharge: Family, Available PRN/intermittently Type of Home: House Home Access: Stairs to enter Technical brewer of Steps: 3 Entrance Stairs-Rails: None Home Layout: Two level, Able to live on main level with bedroom/bathroom, Full bath on main level Bathroom Shower/Tub: Chiropodist: Standard Bathroom Accessibility: Yes Home Equipment: Cane - single point Additional Comments: Pt reports family will not assist him much.  Lives With: Family   Functional History: Prior Function Level of Independence: Independent with assistive device(s) Comments: Does not work. Drives.  Functional Status:  Mobility: Bed Mobility Overal bed mobility: Needs Assistance Bed Mobility: Supine to Sit Supine to sit: Min guard, HOB elevated Sit to supine: Supervision General bed mobility comments: Increased time and effort, heavy use of bed rail; assist for lines Transfers Overall transfer level: Needs assistance Equipment used: None, Rolling walker (2 wheeled) Transfers: Lateral/Scoot Transfers, Sit to/from Stand Sit to Stand: Mod assist, +2 safety/equipment Stand pivot transfers: Min assist, Min guard, +2 safety/equipment Squat pivot transfers: Min assist, +2 safety/equipment  Lateral/Scoot Transfers: Min assist, +2 physical assistance, From elevated surface General transfer comment: Pt with urgency to have BM requiring max verbal cues for safety/lines prior to performing lateral scoot to drop-arm BSC, minA for stability (assist for set-up and lines); additional stand from [facility] with modA for  trunk elevation and stability, heavy reliance on momentum and UE support to power into standing; poor eccentric control into sitting as pt quick to fatigue and attempting to sit prematurely requiring modA+2 for safe lowering; increased time to recover after each transfer, pt anxiously asking for bipap "to catch my breath" Ambulation/Gait Ambulation/Gait assistance: Mod assist, +2 safety/equipment Gait Distance (Feet): 2 Feet Assistive device: Rolling walker (2 wheeled) Gait Pattern/deviations: Step-through pattern, Decreased stride length General Gait Details: Pt able to take 3-4 complete hops on RLE with RW and modA for stability; quick to fatigue  reverting to scooting pivot on RLE with RW in order to complete transfer to recliner; max verbal cues for sequencing and safety as pt attempting to sit prematurely Gait velocity: decreased Gait velocity interpretation: 1.31 - 2.62 ft/sec, indicative of limited community ambulator    ADL: ADL Overall ADL's : Needs assistance/impaired Eating/Feeding: Independent, Sitting Grooming: Wash/dry face, Wash/dry hands, Set up, Sitting Grooming Details (indicate cue type and reason): on BSC during toileting Upper Body Bathing: Set up, Sitting Lower Body Bathing: Maximal assistance, Sitting/lateral leans Upper Body Dressing : Minimal assistance, Sitting Lower Body Dressing: Maximal assistance, Sitting/lateral leans Lower Body Dressing Details (indicate cue type and reason): to don sock on RLE - maybe benefit from sock aide? Toilet Transfer: Min guard, Cueing for safety, Squat-pivot (lateral scoot transfer) Toilet Transfer Details (indicate cue type and reason): to the right to [facility] squat pivot/lateral scoot Toileting- Clothing Manipulation and Hygiene: Maximal assistance, Sit to/from stand, +2 for safety/equipment Toileting - Clothing Manipulation Details (indicate cue type and reason): Pt able to come to standing with max A, then 2nd therapist provided peri  care while Pt maintained standing for approx 2 min Functional mobility during ADLs:  (squat pivot) General ADL Comments: significant decline in ability to complete ADL tasks s/p BKA. Educated on strategies for LB ADLs via lateral leans or in bed to maximize independence and decrease fall risk (while also progressing standing balance). Educated on built up grip use for utensils during meals with pt reporting "this will work" but declined to actually attempt task  Cognition: Cognition Overall Cognitive Status: No family/caregiver present to determine baseline cognitive functioning Orientation Level: Oriented X4 Cognition Arousal/Alertness: Awake/alert Behavior During Therapy: Flat affect Overall Cognitive Status: No family/caregiver present to determine baseline cognitive functioning Area of Impairment: Attention, Following commands, Safety/judgement, Awareness, Problem solving Current Attention Level: Selective Memory: Decreased short-term memory Following Commands: Follows one step commands with increased time Safety/Judgement: Decreased awareness of safety, Decreased awareness of deficits Awareness: Emergent Problem Solving: Slow processing, Difficulty sequencing, Requires verbal cues, Requires tactile cues General Comments: Pt slightly impulsive today; frequently using Bipap to 'recover' (pt reports due to "losing my breath" but seems to be more related to anxiety); multimodal cues for sequencing, problem solving and safety; pt making moaning noises throughout session but stating "I'm fine"  Physical Exam: Blood pressure 98/76, pulse 75, temperature 97.6 F (36.4 C), temperature source Oral, resp. rate 19, height _0  (1.956 m), weight 109.5 kg, SpO2 100 %. Physical Exam Constitutional:      Appearance: He is obese. He is ill-appearing.  HENT:     Head: Normocephalic.     Right Ear: External ear normal.     Left Ear: External ear normal.     Nose: Nose normal.     Mouth/Throat:      Mouth: Mucous membranes are moist.     Pharynx: Oropharynx is clear.  Eyes:     Pupils: Pupils are equal, round, and reactive to light.  Cardiovascular:     Rate and Rhythm: Normal rate and regular rhythm.     Heart sounds: No murmur heard.   No gallop.  Pulmonary:     Effort: Pulmonary effort is normal. No respiratory distress.     Breath sounds: No wheezing.  Abdominal:     General: There is no distension.     Tenderness: There is no abdominal tenderness.  Musculoskeletal:        General: Swelling and tenderness present.     Cervical back: Normal  range of motion and neck supple.  Skin:    Comments: Left BKA with vac dressing. Limb contained in limb guard.   Neurological:     Cranial Nerves: Cranial nerve deficit present.     Comments: Pt a little lethargic. Did awaken. Followed commands. UE 4-5/5. RLE 3-4/5 , LLE limited by pain. No focal sensory findings. Cerebellar normal. No abnormal tone.   Psychiatric:     Comments: Pt a little flat but cooperative with exam and questioning    Results for orders placed or performed during the hospital encounter of 01/15/21 (from the past 48 hour(s))  Glucose, capillary     Status: Abnormal   Collection Time: 01/25/21  4:57 PM  Result Value Ref Range   Glucose-Capillary 178 (H) 70 - 99 mg/dL    Comment: Glucose reference range applies only to samples taken after fasting for at least 8 hours.  Glucose, capillary     Status: Abnormal   Collection Time: 01/25/21  9:25 PM  Result Value Ref Range   Glucose-Capillary 154 (H) 70 - 99 mg/dL    Comment: Glucose reference range applies only to samples taken after fasting for at least 8 hours.   Comment 1 Notify RN    Comment 2 Document in Chart   .Cooxemetry Panel (carboxy, met, total hgb, O2 sat)     Status: None   Collection Time: 01/26/21  5:00 AM  Result Value Ref Range   Total hemoglobin 14.0 12.0 - 16.0 g/dL   O2 Saturation 67.1 %   Carboxyhemoglobin 1.2 0.5 - 1.5 %   Methemoglobin 1.2  0.0 - 1.5 %    Comment: Performed at [facility] [address], [place], [place] [address]  Glucose, capillary     Status: Abnormal   Collection Time: 01/26/21  5:57 AM  Result Value Ref Range   Glucose-Capillary 123 (H) 70 - 99 mg/dL    Comment: Glucose reference range applies only to samples taken after fasting for at least 8 hours.  Basic metabolic panel     Status: Abnormal   Collection Time: 01/26/21  8:48 AM  Result Value Ref Range   Sodium 130 (L) 135 - 145 mmol/L   Potassium 4.6 3.5 - 5.1 mmol/L   Chloride 89 (L) 98 - 111 mmol/L   CO2 34 (H) 22 - 32 mmol/L   Glucose, Bld 110 (H) 70 - 99 mg/dL    Comment: Glucose reference range applies only to samples taken after fasting for at least 8 hours.   BUN 33 (H) 6 - 20 mg/dL   Creatinine, Ser 1.16 0.61 - 1.24 mg/dL   Calcium 8.4 (L) 8.9 - 10.3 mg/dL   GFR, Estimated >60 >60 mL/min    Comment: (NOTE) Calculated using the CKD-EPI Creatinine Equation (2021)    Anion gap 7 5 - 15    Comment: Performed at [facility] [address], [place], [place] [address]  Glucose, capillary     Status: Abnormal   Collection Time: 01/26/21 11:04 AM  Result Value Ref Range   Glucose-Capillary 144 (H) 70 - 99 mg/dL    Comment: Glucose reference range applies only to samples taken after fasting for at least 8 hours.  Glucose, capillary     Status: Abnormal   Collection Time: 01/26/21  4:00 PM  Result Value Ref Range   Glucose-Capillary 147 (H) 70 - 99 mg/dL    Comment: Glucose reference range applies only to samples taken after fasting for  at least 8 hours.  Glucose, capillary     Status: Abnormal   Collection Time: 01/26/21  9:10 PM  Result Value Ref Range   Glucose-Capillary 185 (H) 70 - 99 mg/dL    Comment: Glucose reference range applies only to samples taken after fasting for at least 8 hours.   Comment 1 Notify RN    Comment 2 Document in Chart   CBC with Differential/Platelet     Status: Abnormal   Collection Time:  01/26/21 11:00 PM  Result Value Ref Range   WBC 18.6 (H) 4.0 - 10.5 K/uL   RBC 4.80 4.22 - 5.81 MIL/uL   Hemoglobin 13.6 13.0 - 17.0 g/dL   HCT 42.7 39.0 - 52.0 %   MCV 89.0 80.0 - 100.0 fL   MCH 28.3 26.0 - 34.0 pg   MCHC 31.9 30.0 - 36.0 g/dL   RDW 18.0 (H) 11.5 - 15.5 %   Platelets 370 150 - 400 K/uL   nRBC 0.0 0.0 - 0.2 %   Neutrophils Relative % 82 %   Neutro Abs 15.1 (H) 1.7 - 7.7 K/uL   Lymphocytes Relative 8 %   Lymphs Abs 1.4 0.7 - 4.0 K/uL   Monocytes Relative 9 %   Monocytes Absolute 1.7 (H) 0.1 - 1.0 K/uL   Eosinophils Relative 0 %   Eosinophils Absolute 0.0 0.0 - 0.5 K/uL   Basophils Relative 0 %   Basophils Absolute 0.0 0.0 - 0.1 K/uL   Immature Granulocytes 1 %   Abs Immature Granulocytes 0.20 (H) 0.00 - 0.07 K/uL    Comment: Performed at [facility], [address], [place], [place] [address]  Glucose, capillary     Status: Abnormal   Collection Time: 01/27/21  6:29 AM  Result Value Ref Range   Glucose-Capillary 110 (H) 70 - 99 mg/dL    Comment: Glucose reference range applies only to samples taken after fasting for at least 8 hours.   Comment 1 Notify RN    Comment 2 Document in Chart   .Cooxemetry Panel (carboxy, met, total hgb, O2 sat)     Status: None   Collection Time: 01/27/21  6:58 AM  Result Value Ref Range   Total hemoglobin 14.2 12.0 - 16.0 g/dL   O2 Saturation 41.9 %   Carboxyhemoglobin 1.2 0.5 - 1.5 %   Methemoglobin 0.9 0.0 - 1.5 %    Comment: Performed at [facility], [place] [address] [place], [place] [address]  CBC with Differential/Platelet     Status: Abnormal   Collection Time: 01/27/21  7:10 AM  Result Value Ref Range   WBC 15.2 (H) 4.0 - 10.5 K/uL   RBC 4.85 4.22 - 5.81 MIL/uL   Hemoglobin 13.9 13.0 - 17.0 g/dL   HCT 43.0 39.0 - 52.0 %   MCV 88.7 80.0 - 100.0 fL   MCH 28.7 26.0 - 34.0 pg   MCHC 32.3 30.0 - 36.0 g/dL   RDW 18.0 (H) 11.5 - 15.5 %   Platelets 378 150 - 400 K/uL   nRBC 0.0 0.0 - 0.2 %   Neutrophils  Relative % 81 %   Neutro Abs 12.2 (H) 1.7 - 7.7 K/uL   Lymphocytes Relative 9 %   Lymphs Abs 1.4 0.7 - 4.0 K/uL   Monocytes Relative 9 %   Monocytes Absolute 1.4 (H) 0.1 - 1.0 K/uL   Eosinophils Relative 0 %   Eosinophils Absolute 0.0 0.0 - 0.5 K/uL   Basophils Relative  0 %   Basophils Absolute 0.0 0.0 - 0.1 K/uL   Immature Granulocytes 1 %   Abs Immature Granulocytes 0.17 (H) 0.00 - 0.07 K/uL    Comment: Performed at [facility], [place] [address], [place], [place] [address]  Basic metabolic panel     Status: Abnormal   Collection Time: 01/27/21  7:10 AM  Result Value Ref Range   Sodium 131 (L) 135 - 145 mmol/L   Potassium 4.7 3.5 - 5.1 mmol/L   Chloride 90 (L) 98 - 111 mmol/L   CO2 33 (H) 22 - 32 mmol/L   Glucose, Bld 131 (H) 70 - 99 mg/dL    Comment: Glucose reference range applies only to samples taken after fasting for at least 8 hours.   BUN 39 (H) 6 - 20 mg/dL   Creatinine, Ser 1.34 (H) 0.61 - 1.24 mg/dL   Calcium 8.8 (L) 8.9 - 10.3 mg/dL   GFR, Estimated >60 >60 mL/min    Comment: (NOTE) Calculated using the CKD-EPI Creatinine Equation (2021)    Anion gap 8 5 - 15    Comment: Performed at [facility] [address], [place], [place] [address]  Glucose, capillary     Status: Abnormal   Collection Time: 01/27/21 11:26 AM  Result Value Ref Range   Glucose-Capillary 124 (H) 70 - 99 mg/dL    Comment: Glucose reference range applies only to samples taken after fasting for at least 8 hours.   CT Super D Chest Wo Contrast  Result Date: 01/26/2021 CLINICAL DATA:  Shortness of breath. EXAM: CT CHEST WITHOUT CONTRAST TECHNIQUE: Multidetector CT imaging of the chest was performed using thin slice collimation for electromagnetic bronchoscopy planning purposes, without intravenous contrast. COMPARISON:  12/17/2020, 11/24/2015 and CT abdomen 08/10/2017. FINDINGS: Cardiovascular: Right PICC terminates in the right atrium. Atherosclerotic calcification of the aorta. Pulmonic  trunk and heart are enlarged. No pericardial effusion. Mediastinum/Nodes: No pathologically enlarged mediastinal or axillary lymph nodes. Hilar regions are difficult to evaluate without IV contrast. Esophagus is grossly unremarkable. Lungs/Pleura: Image quality is degraded by respiratory motion and slight expiratory phase imaging. Smoothly marginated left lower lobe mass measures 3.2 x 3.3 cm, minimally larger than on remote prior exam 11/24/2015, 2.4 x 2.5 cm. Minimal subsegmental volume loss in the left lower lobe. No pleural fluid. Airway is unremarkable. Upper Abdomen: Visualized portion of the liver is unremarkable. Bilateral adrenal nodules measure 2.3 cm on the right and 20 Hounsfield units and on the left, 1.7 cm and 33 Hounsfield units, similar to 08/10/2017. Visualized portions of the kidneys, spleen, pancreas, stomach and bowel are grossly unremarkable. Musculoskeletal: No worrisome lytic or sclerotic lesions. IMPRESSION: 1. No acute findings. 2. Left lower lobe mass with indolent growth over time. Findings favor a benign lesion such as a hamartoma. 3. Bilateral adrenal nodules, similar to 08/10/2017, indicative of lipid poor adenomas. 4.  Aortic atherosclerosis (ICD10-I70.0). 5. Enlarged pulmonic trunk, indicative of pulmonary arterial hypertension. Electronically Signed   By: Lorin Picket M.D.   On: 01/26/2021 08:05       Medical Problem List and Plan: 1.  Functional and mobility deficits secondary to left BKA and multiple medical  -patient may not yet shower  -ELOS/Goals: 14-16 days, supervision goals 2.  Antithrombotics: -DVT/anticoagulation:  Pharmaceutical: Lovenox  -antiplatelet therapy: Continue ASA 3. Pain Management: pt with incisional and phantom pain  -tylenol for mild pain, hydrocodone for moderate to severe pain  -robaxin for spasms  -add gabapentin for phantom limb  pain 4. Mood: LCSW to follow for evaluation and support.   -antipsychotic agents: N/A 5. Neuropsych: This  patient is capable of making decisions on his own behalf. 6. Skin/Wound Care: Routine pressure relief measures.  7. Fluids/Electrolytes/Nutrition: Strict I/O. Continue Zinc and Juven to promote wound healing 8. Leucocytosis/Bacteremia: WBC trending down.  Continue antibiotics for 2 more days.  -- Recheck CBC in AM. 9.  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  -- Encourage pulmonary hygiene. 10.  Chronic combined CHF: Check daily weights.  Monitor for signs of overload.  -- On Zebeta, hydralazine, Entresto, Jardiance, Aldactone, Imdur, Lipitor and ASA 11.  T2DM: Monitor blood sugars ac/hs and2 use SSI for elevated BS  --Continue Lantus bid w/Jardiance and 4 units novolog for meal coverage 12. Anxiety d/o: Team to provide ego support.  --on buspar and cymbalta.  13. Constipation: Has been on IV reglan and Senna bid 14. Ongoing hiccups: Continue baclofen tid prn--may need it scheduled         Bary Leriche, PA-C 01/27/2021

## 2021-01-27 NOTE — Progress Notes (Signed)
Pt alert and oriented x4, VSS on room air. All belongings with patient at bedside, pt updated on transition of care to CIR. All questions encouraged and answered approprietly. PIV removed, PICC remains in place. Antibiotics currently infusing. Wound vac in place. Pt transported to CIR by techs for transition of care.

## 2021-01-27 NOTE — H&P (Signed)
    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  He was treated with IV diuresis as well as BiPAP with strict I's and O's recommended per cardiology.  He was started on broad-spectrum antibiotics with TEE for work-up per ID input.  TEE done on 07/28 showing EF 15 to 20% with severely dilated LV and diffuse hypokinesis but was negative for vegetation, ASD, PFO and showed mild MR.  .   Left foot was suspected as source of infection and Dr. Duda was consulted for input.Left foot CT showed swelling and soft tissue gas concerning for necrotizing infection and possibility of early osteomyelitis as well as nondisplaced calcaneal process fracture around lateral aspect.   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 As blood pressures stabilizing, cardiac medications resumed. 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  White count has slowly trended down and ID recommended narrowing antibiotics to pen G with recommendations to continue antibiotics for  2 additional days. CT chest done due to issues with shortness of breath and showed benign lesion as well as no acute findings.  PCCM consulted and reported that this was not a new finding as mass consistent with hematoma per prior biopsy and had been slowly growing.  They recommended having patient follow-up with [facility] pulmonology after discharge.  Patient continues to  be limited by anxiety due to shortness of breath with minimal activity as well as debility..  CIR recommended due to functional decline.   Review of Systems Constitutional:  Positive for malaise/fatigue. Negative for chills and fever. HENT:  Negative for ear discharge and ear pain.   Eyes:  Negative for blurred vision and double vision. Respiratory:  Positive for shortness of breath. Negative for cough and hemoptysis.   Cardiovascular:  Negative for chest pain and palpitations. Gastrointestinal:  Negative for nausea and vomiting. Genitourinary:  Negative for frequency and urgency. Musculoskeletal:  Positive for joint pain and myalgias. Neurological:  Positive for weakness. Negative for dizziness. Psychiatric/Behavioral:  Negative for depression and suicidal ideas.         Past Medical History:  Diagnosis Date   AICD (automatic cardioverter/defibrillator) present 01/11/2017   Anxiety     CHF (congestive heart failure) (HCC)     CKD (chronic kidney disease)     COPD (chronic obstructive pulmonary disease) (HCC)      never smoker, industrial exposure   Diabetes mellitus with diabetic neuropathy, with long-term current use of insulin (HCC)     GERD (gastroesophageal reflux disease)     High cholesterol     History of gout      "take RX qd" (11/01/2017)   Hypertension     Nonobstructive atherosclerosis of coronary artery     On home oxygen therapy      "2L prn" (11/01/2017)   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HCC)        LLL, present for years   Systolic HF (heart failure) (HCC)           Past Surgical History:  Procedure Laterality Date   AMPUTATION Left 01/21/2021    Procedure: AMPUTATION BELOW KNEE;  Surgeon: Duda, Marcus V, MD;  Location: [facility] OR;  Service: Orthopedics;  Laterality: Left;   APPLICATION OF WOUND VAC Left 01/21/2021    Procedure: APPLICATION OF WOUND VAC;  Surgeon: Duda, Marcus V, MD;  Location: [facility] OR;  Service: Orthopedics;  Laterality: Left;   CATARACT EXTRACTION W/  INTRAOCULAR LENS IMPLANTW/ TRABECULECTOMY Left ~ 2013    "may have done this when I had my other eye OR"   CIRCUMCISION       CORONARY STENT INTERVENTION N/A 11/02/2017    Procedure: CORONARY STENT INTERVENTION;  Surgeon: McLean, Dalton S, MD;  Location: [facility];  Service: Cardiovascular;  Laterality: N/A;   CORONARY STENT INTERVENTION N/A 11/02/2017    Procedure: CORONARY STENT INTERVENTION;  Surgeon: Smith, Henry W, MD;  Location: [facility];  Service: Cardiovascular;  Laterality: N/A;   EYE SURGERY Left ~ 2013    "fell on something in basement; stuck in my eye"   ICD IMPLANT        Medtronic Dual Chamber 01/11/17   MULTIPLE TOOTH EXTRACTIONS        "pulled all my top teeth"   RIGHT/LEFT HEART CATH AND CORONARY ANGIOGRAPHY N/A 11/02/2017    Procedure: RIGHT/LEFT HEART CATH AND CORONARY ANGIOGRAPHY;  Surgeon: McLean, Dalton S, MD;  Location: [facility];  Service: Cardiovascular;  Laterality: N/A;   TEE WITHOUT CARDIOVERSION N/A 01/20/2021    Procedure: TRANSESOPHAGEAL ECHOCARDIOGRAM (TEE);  Surgeon: McLean, Dalton S, MD;  Location: [facility];  Service: Cardiovascular;  Laterality: N/A;         Family History  Problem Relation Age of Onset   Hypertension Mother     Diabetes Mother     Hypertension Father     Diabetes Father     Diabetes Sister     Diabetes Brother      Social History:  reports that he has quit smoking. His smoking use included cigarettes. He has never used smokeless tobacco. He reports previous alcohol use. He reports that he does not use drugs. Allergies:      Allergies  Allergen Reactions   Aspirin Itching   Codeine Hives   Coconut Oil Itching          Medications Prior to Admission  Medication Sig Dispense Refill   acetaminophen (TYLENOL) 650 MG CR tablet Take 650 mg by mouth every 8 (eight) hours as needed for pain.       albuterol (VENTOLIN HFA) 108 (90 Base) MCG/ACT inhaler Inhale 1-2 puffs into the lungs every 4 (four) hours as  needed for wheezing or shortness of breath. 8 g 1   allopurinol (ZYLOPRIM) 100 MG tablet Take 1 tablet (100 mg total) by mouth daily. 30 tablet 3   aspirin 81 MG chewable tablet Chew 81 mg by mouth daily.       atorvastatin (LIPITOR) 40 MG tablet TAKE ONE TABLET BY MOUTH DAILY AT 6PM 90 tablet 0   bisoprolol (ZEBETA) 5 MG tablet Take 2.5 mg by mouth daily.       BREO ELLIPTA 100-25 MCG/INH AEPB Inhale 1 puff into the lungs daily.       busPIRone (BUSPAR) 5 MG tablet Take 5 mg by mouth 2 (two) times daily.         docusate sodium (COLACE) 100 MG capsule Take 100 mg by mouth daily as needed for mild constipation.       DULoxetine (CYMBALTA) 60 MG capsule Take 60 mg by mouth daily.   2   eplerenone (INSPRA) 50 MG tablet Take 1 tablet (50 mg total) by mouth daily. 30 tablet 6   fenofibrate (TRICOR) 145 MG tablet TAKE ONE TABLET BY MOUTH DAILY 30 tablet 1   hydrALAZINE (APRESOLINE) 50 MG tablet Take 1.5 tablets (75 mg total) by mouth 3 (three) times daily. 135 tablet 3   insulin regular human CONCENTRATED (HUMULIN R) 500 UNIT/ML kwikpen Inject 0-400 Units into the skin as directed. Sliding       JARDIANCE 25 MG TABS tablet Take 25 mg by mouth daily. 30 tablet 6   MAGNESIUM-OXIDE 400 (241.3 Mg) MG tablet Take 1 tablet (400 mg total) by mouth daily. 30 tablet 11   pregabalin (LYRICA) 150 MG capsule Take 150 mg by mouth 3 (three) times daily.       sacubitril-valsartan (ENTRESTO) 97-103 MG Take 1 tablet by mouth 2 (two) times daily. 180 tablet 3   SSD 1 % cream Apply 1 application topically daily as needed for wound care.       [DISCONTINUED] Torsemide 40 MG TABS Take 80 mg by mouth in the morning AND 60 mg every evening. 105 tablet 3   icosapent Ethyl (VASCEPA) 1 g capsule Take 2 capsules (2 g total) by mouth 2 (two) times daily. (Patient not taking: No sig reported) 120 capsule 11   Insulin Pen Needle (NOVOFINE) 30G X 8 MM MISC Inject 10 each into the skin as needed. 100 each 0   isosorbide dinitrate  (ISORDIL) 20 MG tablet TAKE TWO TABLETS BY MOUTH THREE TIMES A DAY 90 tablet 2   omeprazole (PRILOSEC) 20 MG capsule TAKE ONE CAPSULE BY MOUTH DAILY (Patient not taking: No sig reported) 30 capsule 0      Drug Regimen Review  Drug regimen was reviewed and remains appropriate with no significant issues identified   Home: Home Living Family/patient expects to be discharged to:: Private residence Living Arrangements: Spouse/significant other, Children Available Help at Discharge: Family, Available PRN/intermittently Type of Home: House Home Access: Stairs to enter Technical brewer of Steps: 3 Entrance Stairs-Rails: None Home Layout: Two level, Able to live on main level with bedroom/bathroom, Full bath on main level Bathroom Shower/Tub: Chiropodist: Standard Bathroom Accessibility: Yes Home Equipment: Cane - single point Additional Comments: Pt reports family will not assist him much.  Lives With: Family   Functional History: Prior Function Level of Independence: Independent with assistive device(s) Comments: Does not work. Drives.   Functional Status:  Mobility: Bed Mobility Overal bed mobility: Needs Assistance Bed Mobility: Supine to Sit Supine to sit: Min guard, HOB elevated Sit to supine: Supervision General bed mobility comments: Increased time and effort, heavy use of bed rail; assist for lines Transfers Overall transfer level: Needs assistance Equipment used: None, Rolling walker (2 wheeled) Transfers: Lateral/Scoot Transfers, Sit to/from Stand Sit to Stand: Mod assist, +2 safety/equipment Stand pivot transfers: Min assist, Min guard, +2 safety/equipment Squat pivot transfers: Min assist, +2 safety/equipment  Lateral/Scoot Transfers: Min assist, +2 physical assistance, From elevated surface General transfer comment: Pt with urgency to have BM requiring max verbal cues for safety/lines prior to performing lateral scoot to drop-arm BSC, minA  for stability (assist for set-up and lines); additional stand from [facility] with modA for trunk elevation and stability, heavy reliance  on momentum and UE support to power into standing; poor eccentric control into sitting as pt quick to fatigue and attempting to sit prematurely requiring modA+2 for safe lowering; increased time to recover after each transfer, pt anxiously asking for bipap "to catch my breath" Ambulation/Gait Ambulation/Gait assistance: Mod assist, +2 safety/equipment Gait Distance (Feet): 2 Feet Assistive device: Rolling walker (2 wheeled) Gait Pattern/deviations: Step-through pattern, Decreased stride length General Gait Details: Pt able to take 3-4 complete hops on RLE with RW and modA for stability; quick to fatigue reverting to scooting pivot on RLE with RW in order to complete transfer to recliner; max verbal cues for sequencing and safety as pt attempting to sit prematurely Gait velocity: decreased Gait velocity interpretation: 1.31 - 2.62 ft/sec, indicative of limited community ambulator   ADL: ADL Overall ADL's : Needs assistance/impaired Eating/Feeding: Independent, Sitting Grooming: Wash/dry face, Wash/dry hands, Set up, Sitting Grooming Details (indicate cue type and reason): on BSC during toileting Upper Body Bathing: Set up, Sitting Lower Body Bathing: Maximal assistance, Sitting/lateral leans Upper Body Dressing : Minimal assistance, Sitting Lower Body Dressing: Maximal assistance, Sitting/lateral leans Lower Body Dressing Details (indicate cue type and reason): to don sock on RLE - maybe benefit from sock aide? Toilet Transfer: Min guard, Cueing for safety, Squat-pivot (lateral scoot transfer) Toilet Transfer Details (indicate cue type and reason): to the right to [facility] squat pivot/lateral scoot Toileting- Clothing Manipulation and Hygiene: Maximal assistance, Sit to/from stand, +2 for safety/equipment Toileting - Clothing Manipulation Details (indicate cue type and  reason): Pt able to come to standing with max A, then 2nd therapist provided peri care while Pt maintained standing for approx 2 min Functional mobility during ADLs:  (squat pivot) General ADL Comments: significant decline in ability to complete ADL tasks s/p BKA. Educated on strategies for LB ADLs via lateral leans or in bed to maximize independence and decrease fall risk (while also progressing standing balance). Educated on built up grip use for utensils during meals with pt reporting "this will work" but declined to actually attempt task   Cognition: Cognition Overall Cognitive Status: No family/caregiver present to determine baseline cognitive functioning Orientation Level: Oriented X4 Cognition Arousal/Alertness: Awake/alert Behavior During Therapy: Flat affect Overall Cognitive Status: No family/caregiver present to determine baseline cognitive functioning Area of Impairment: Attention, Following commands, Safety/judgement, Awareness, Problem solving Current Attention Level: Selective Memory: Decreased short-term memory Following Commands: Follows one step commands with increased time Safety/Judgement: Decreased awareness of safety, Decreased awareness of deficits Awareness: Emergent Problem Solving: Slow processing, Difficulty sequencing, Requires verbal cues, Requires tactile cues General Comments: Pt slightly impulsive today; frequently using Bipap to 'recover' (pt reports due to "losing my breath" but seems to be more related to anxiety); multimodal cues for sequencing, problem solving and safety; pt making moaning noises throughout session but stating "I'm fine"   Physical Exam: Blood pressure 98/76, pulse 75, temperature 97.6 F (36.4 C), temperature source Oral, resp. rate 19, height 6' 5" (1.956 m), weight 109.5 kg, SpO2 100 %. Physical Exam Constitutional:      Appearance: He is obese. He is ill-appearing. HENT:    Head: Normocephalic.    Right Ear: External ear normal.     Left Ear: External ear normal.    Nose: Nose normal.    Mouth/Throat:    Mouth: Mucous membranes are moist.    Pharynx: Oropharynx is clear. Eyes:    Pupils: Pupils are equal, round, and reactive to light. Cardiovascular:    Rate and Rhythm: Normal rate  and regular rhythm.    Heart sounds: No murmur heard.   No gallop. Pulmonary:    Effort: Pulmonary effort is normal. No respiratory distress.    Breath sounds: No wheezing. Abdominal:    General: There is no distension.    Tenderness: There is no abdominal tenderness. Musculoskeletal:        General: Swelling and tenderness present.    Cervical back: Normal range of motion and neck supple. Skin:    Comments: Left BKA with vac dressing. Limb contained in limb guard.   Neurological:    Cranial Nerves: Cranial nerve deficit present.    Comments: Pt a little lethargic. Did awaken. Followed commands. UE 4-5/5. RLE 3-4/5 , LLE limited by pain. No focal sensory findings. Cerebellar normal. No abnormal tone.   Psychiatric:    Comments: Pt a little flat but cooperative with exam and questioning      Lab Results Last 48 Hours        Results for orders placed or performed during the hospital encounter of 01/15/21 (from the past 48 hour(s))  Glucose, capillary     Status: Abnormal    Collection Time: 01/25/21  4:57 PM  Result Value Ref Range    Glucose-Capillary 178 (H) 70 - 99 mg/dL      Comment: Glucose reference range applies only to samples taken after fasting for at least 8 hours.  Glucose, capillary     Status: Abnormal    Collection Time: 01/25/21  9:25 PM  Result Value Ref Range    Glucose-Capillary 154 (H) 70 - 99 mg/dL      Comment: Glucose reference range applies only to samples taken after fasting for at least 8 hours.    Comment 1 Notify RN      Comment 2 Document in Chart    .Cooxemetry Panel (carboxy, met, total hgb, O2 sat)     Status: None    Collection Time: 01/26/21  5:00 AM  Result Value Ref Range    Total  hemoglobin 14.0 12.0 - 16.0 g/dL    O2 Saturation 67.1 %    Carboxyhemoglobin 1.2 0.5 - 1.5 %    Methemoglobin 1.2 0.0 - 1.5 %      Comment: Performed at [facility], [address] [place], [place] [address]  Glucose, capillary     Status: Abnormal    Collection Time: 01/26/21  5:57 AM  Result Value Ref Range    Glucose-Capillary 123 (H) 70 - 99 mg/dL      Comment: Glucose reference range applies only to samples taken after fasting for at least 8 hours.  Basic metabolic panel     Status: Abnormal    Collection Time: 01/26/21  8:48 AM  Result Value Ref Range    Sodium 130 (L) 135 - 145 mmol/L    Potassium 4.6 3.5 - 5.1 mmol/L    Chloride 89 (L) 98 - 111 mmol/L    CO2 34 (H) 22 - 32 mmol/L    Glucose, Bld 110 (H) 70 - 99 mg/dL      Comment: Glucose reference range applies only to samples taken after fasting for at least 8 hours.    BUN 33 (H) 6 - 20 mg/dL    Creatinine, Ser 1.16 0.61 - 1.24 mg/dL    Calcium 8.4 (L) 8.9 - 10.3 mg/dL    GFR, Estimated >60 >60 mL/min      Comment: (NOTE) Calculated using the CKD-EPI Creatinine Equation (2021)        Anion gap 7 5 - 15      Comment: Performed at [facility] [address] [place], [place] [address]  Glucose, capillary     Status: Abnormal    Collection Time: 01/26/21 11:04 AM  Result Value Ref Range    Glucose-Capillary 144 (H) 70 - 99 mg/dL      Comment: Glucose reference range applies only to samples taken after fasting for at least 8 hours.  Glucose, capillary     Status: Abnormal    Collection Time: 01/26/21  4:00 PM  Result Value Ref Range    Glucose-Capillary 147 (H) 70 - 99 mg/dL      Comment: Glucose reference range applies only to samples taken after fasting for at least 8 hours.  Glucose, capillary     Status: Abnormal    Collection Time: 01/26/21  9:10 PM  Result Value Ref Range    Glucose-Capillary 185 (H) 70 - 99 mg/dL      Comment: Glucose reference range applies only to samples taken after fasting  for at least 8 hours.    Comment 1 Notify RN      Comment 2 Document in Chart    CBC with Differential/Platelet     Status: Abnormal    Collection Time: 01/26/21 11:00 PM  Result Value Ref Range    WBC 18.6 (H) 4.0 - 10.5 K/uL    RBC 4.80 4.22 - 5.81 MIL/uL    Hemoglobin 13.6 13.0 - 17.0 g/dL    HCT 42.7 39.0 - 52.0 %    MCV 89.0 80.0 - 100.0 fL    MCH 28.3 26.0 - 34.0 pg    MCHC 31.9 30.0 - 36.0 g/dL    RDW 18.0 (H) 11.5 - 15.5 %    Platelets 370 150 - 400 K/uL    nRBC 0.0 0.0 - 0.2 %    Neutrophils Relative % 82 %    Neutro Abs 15.1 (H) 1.7 - 7.7 K/uL    Lymphocytes Relative 8 %    Lymphs Abs 1.4 0.7 - 4.0 K/uL    Monocytes Relative 9 %    Monocytes Absolute 1.7 (H) 0.1 - 1.0 K/uL    Eosinophils Relative 0 %    Eosinophils Absolute 0.0 0.0 - 0.5 K/uL    Basophils Relative 0 %    Basophils Absolute 0.0 0.0 - 0.1 K/uL    Immature Granulocytes 1 %    Abs Immature Granulocytes 0.20 (H) 0.00 - 0.07 K/uL      Comment: Performed at [facility], [address], [place], [place] [address]  Glucose, capillary     Status: Abnormal    Collection Time: 01/27/21  6:29 AM  Result Value Ref Range    Glucose-Capillary 110 (H) 70 - 99 mg/dL      Comment: Glucose reference range applies only to samples taken after fasting for at least 8 hours.    Comment 1 Notify RN      Comment 2 Document in Chart    .Cooxemetry Panel (carboxy, met, total hgb, O2 sat)     Status: None    Collection Time: 01/27/21  6:58 AM  Result Value Ref Range    Total hemoglobin 14.2 12.0 - 16.0 g/dL    O2 Saturation 41.9 %    Carboxyhemoglobin 1.2 0.5 - 1.5 %    Methemoglobin 0.9 0.0 - 1.5 %      Comment: Performed at [facility], [address],  [place], [place] [address]  CBC with Differential/Platelet     Status: Abnormal    Collection Time: 01/27/21  7:10 AM  Result Value Ref Range    WBC 15.2 (H) 4.0 - 10.5 K/uL    RBC 4.85 4.22 - 5.81 MIL/uL    Hemoglobin 13.9 13.0 - 17.0 g/dL    HCT 43.0  39.0 - 52.0 %    MCV 88.7 80.0 - 100.0 fL    MCH 28.7 26.0 - 34.0 pg    MCHC 32.3 30.0 - 36.0 g/dL    RDW 18.0 (H) 11.5 - 15.5 %    Platelets 378 150 - 400 K/uL    nRBC 0.0 0.0 - 0.2 %    Neutrophils Relative % 81 %    Neutro Abs 12.2 (H) 1.7 - 7.7 K/uL    Lymphocytes Relative 9 %    Lymphs Abs 1.4 0.7 - 4.0 K/uL    Monocytes Relative 9 %    Monocytes Absolute 1.4 (H) 0.1 - 1.0 K/uL    Eosinophils Relative 0 %    Eosinophils Absolute 0.0 0.0 - 0.5 K/uL    Basophils Relative 0 %    Basophils Absolute 0.0 0.0 - 0.1 K/uL    Immature Granulocytes 1 %    Abs Immature Granulocytes 0.17 (H) 0.00 - 0.07 K/uL      Comment: Performed at [facility] [address], [place], [place] [address]  Basic metabolic panel     Status: Abnormal    Collection Time: 01/27/21  7:10 AM  Result Value Ref Range    Sodium 131 (L) 135 - 145 mmol/L    Potassium 4.7 3.5 - 5.1 mmol/L    Chloride 90 (L) 98 - 111 mmol/L    CO2 33 (H) 22 - 32 mmol/L    Glucose, Bld 131 (H) 70 - 99 mg/dL      Comment: Glucose reference range applies only to samples taken after fasting for at least 8 hours.    BUN 39 (H) 6 - 20 mg/dL    Creatinine, Ser 1.34 (H) 0.61 - 1.24 mg/dL    Calcium 8.8 (L) 8.9 - 10.3 mg/dL    GFR, Estimated >60 >60 mL/min      Comment: (NOTE) Calculated using the CKD-EPI Creatinine Equation (2021)      Anion gap 8 5 - 15      Comment: Performed at [facility] [address] [place], [place] [address]  Glucose, capillary     Status: Abnormal    Collection Time: 01/27/21 11:26 AM  Result Value Ref Range    Glucose-Capillary 124 (H) 70 - 99 mg/dL      Comment: Glucose reference range applies only to samples taken after fasting for at least 8 hours.       Imaging Results (Last 48 hours)  CT Super D Chest Wo Contrast   Result Date: 01/26/2021 CLINICAL DATA:  Shortness of breath. EXAM: CT CHEST WITHOUT CONTRAST TECHNIQUE: Multidetector CT imaging of the chest was performed using thin  slice collimation for electromagnetic bronchoscopy planning purposes, without intravenous contrast. COMPARISON:  12/17/2020, 11/24/2015 and CT abdomen 08/10/2017. FINDINGS: Cardiovascular: Right PICC terminates in the right atrium. Atherosclerotic calcification of the aorta. Pulmonic trunk and heart are enlarged. No pericardial effusion. Mediastinum/Nodes: No pathologically enlarged mediastinal or axillary lymph nodes. Hilar regions are difficult to evaluate without IV contrast. Esophagus is grossly unremarkable. Lungs/Pleura: Image quality is degraded by respiratory motion and slight expiratory phase imaging. Smoothly  marginated left lower lobe mass measures 3.2 x 3.3 cm, minimally larger than on remote prior exam 11/24/2015, 2.4 x 2.5 cm. Minimal subsegmental volume loss in the left lower lobe. No pleural fluid. Airway is unremarkable. Upper Abdomen: Visualized portion of the liver is unremarkable. Bilateral adrenal nodules measure 2.3 cm on the right and 20 Hounsfield units and on the left, 1.7 cm and 33 Hounsfield units, similar to 08/10/2017. Visualized portions of the kidneys, spleen, pancreas, stomach and bowel are grossly unremarkable. Musculoskeletal: No worrisome lytic or sclerotic lesions. IMPRESSION: 1. No acute findings. 2. Left lower lobe mass with indolent growth over time. Findings favor a benign lesion such as a hamartoma. 3. Bilateral adrenal nodules, similar to 08/10/2017, indicative of lipid poor adenomas. 4.  Aortic atherosclerosis (ICD10-I70.0). 5. Enlarged pulmonic trunk, indicative of pulmonary arterial hypertension. Electronically Signed   By: Melinda  Blietz M.D.   On: 01/26/2021 08:05             Medical Problem List and Plan: 1.  Functional and mobility deficits secondary to left BKA and multiple medical             -patient may not yet shower             -ELOS/Goals: 14-16 days, supervision goals 2.  Antithrombotics: -DVT/anticoagulation:  Pharmaceutical: Lovenox              -antiplatelet therapy: Continue ASA 3. Pain Management: pt with incisional and phantom pain             -tylenol for mild pain, hydrocodone for moderate to severe pain             -robaxin for spasms             -add gabapentin for phantom limb pain 4. Mood: LCSW to follow for evaluation and support.              -antipsychotic agents: N/A 5. Neuropsych: This patient is capable of making decisions on his own behalf. 6. Skin/Wound Care: Routine pressure relief measures.  7. Fluids/Electrolytes/Nutrition: Strict I/O. Continue Zinc and Juven to promote wound healing 8. Leucocytosis/Bacteremia: WBC trending down.  Continue antibiotics for 2 more days.             -- Recheck CBC in AM. 9.  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             -- Encourage pulmonary hygiene. 10.  Chronic combined CHF: Check daily weights.  Monitor for signs of overload.             -- On Zebeta, hydralazine, Entresto, Jardiance, Aldactone, Imdur, Lipitor and ASA 11.  T2DM: Monitor blood sugars ac/hs and2 use SSI for elevated BS             --Continue Lantus bid w/Jardiance and 4 units novolog for meal coverage 12. Anxiety d/o: Team to provide ego support.             --on buspar and cymbalta. 13. Constipation: Has been on IV reglan and Senna bid 14. Ongoing hiccups: Continue baclofen tid prn--may need it scheduled        Pamela S Love, PA-C 01/27/2021   I have personally performed a face to face diagnostic evaluation of this patient and formulated the key components of the plan.  Additionally, I have personally reviewed laboratory data, imaging studies, as well as relevant notes and concur with the physician assistant's documentation above.  The   patient's status has not changed from the original H&P.  Any changes in documentation from the acute care chart have been noted above.  Zachary T. Swartz, MD, FAAPMR  

## 2021-01-28 ENCOUNTER — Telehealth: Payer: Self-pay | Admitting: Orthopedic Surgery

## 2021-01-28 LAB — GLUCOSE, CAPILLARY
Glucose-Capillary: 80 mg/dL (ref 70–99)
Glucose-Capillary: 176 mg/dL — ABNORMAL HIGH (ref 70–99)
Glucose-Capillary: 199 mg/dL — ABNORMAL HIGH (ref 70–99)
Glucose-Capillary: 193 mg/dL — ABNORMAL HIGH (ref 70–99)

## 2021-01-28 LAB — CBC WITH DIFFERENTIAL/PLATELET
Abs Immature Granulocytes: 0.09 10*3/uL — ABNORMAL HIGH (ref 0.00–0.07)
Basophils Absolute: 0 10*3/uL (ref 0.0–0.1)
Basophils Relative: 0 %
Eosinophils Absolute: 0.1 10*3/uL (ref 0.0–0.5)
Eosinophils Relative: 0 %
HCT: 41.2 % (ref 39.0–52.0)
Hemoglobin: 13.4 g/dL (ref 13.0–17.0)
Immature Granulocytes: 1 %
Lymphocytes Relative: 15 %
Lymphs Abs: 1.8 10*3/uL (ref 0.7–4.0)
MCH: 28.7 pg (ref 26.0–34.0)
MCHC: 32.5 g/dL (ref 30.0–36.0)
MCV: 88.2 fL (ref 80.0–100.0)
Monocytes Absolute: 1.3 10*3/uL — ABNORMAL HIGH (ref 0.1–1.0)
Monocytes Relative: 11 %
Neutro Abs: 8.8 10*3/uL — ABNORMAL HIGH (ref 1.7–7.7)
Neutrophils Relative %: 73 %
Platelets: 352 10*3/uL (ref 150–400)
RBC: 4.67 MIL/uL (ref 4.22–5.81)
RDW: 18.3 % — ABNORMAL HIGH (ref 11.5–15.5)
WBC: 12 10*3/uL — ABNORMAL HIGH (ref 4.0–10.5)
nRBC: 0 % (ref 0.0–0.2)

## 2021-01-28 LAB — COMPREHENSIVE METABOLIC PANEL
ALT: 25 U/L (ref 0–44)
AST: 28 U/L (ref 15–41)
Albumin: 1.7 g/dL — ABNORMAL LOW (ref 3.5–5.0)
Alkaline Phosphatase: 64 U/L (ref 38–126)
Anion gap: 8 (ref 5–15)
BUN: 39 mg/dL — ABNORMAL HIGH (ref 6–20)
CO2: 33 mmol/L — ABNORMAL HIGH (ref 22–32)
Calcium: 8.6 mg/dL — ABNORMAL LOW (ref 8.9–10.3)
Chloride: 91 mmol/L — ABNORMAL LOW (ref 98–111)
Creatinine, Ser: 1.22 mg/dL (ref 0.61–1.24)
GFR, Estimated: >60 mL/min (ref >60)
Glucose, Bld: 86 mg/dL (ref 70–99)
Potassium: 4.5 mmol/L (ref 3.5–5.1)
Sodium: 132 mmol/L — ABNORMAL LOW (ref 135–145)
Total Bilirubin: 0.9 mg/dL (ref 0.3–1.2)
Total Protein: 7.5 g/dL (ref 6.5–8.1)

## 2021-01-28 MED ORDER — SODIUM CHLORIDE 0.9 % IV SOLN
INTRAVENOUS | Status: DC | PRN
  Administered 2021-01-28: 1000 mL via INTRAVENOUS

## 2021-01-28 NOTE — Telephone Encounter (Signed)
Rinaldo Cloud with [facility] called stating there's an ulcer o the pts right foot that she is concerned about. Rinaldo Cloud would like Dr.Duda to review the chart and call the pt if he wants him to make an appt.   Pamela#[phone]

## 2021-01-28 NOTE — Evaluation (Signed)
Physical Therapy Assessment and Plan  Patient Details  Name: Richard [name] MRN: [id number] Date of Birth: 1961/04/14  PT Diagnosis: Abnormal posture, Abnormality of gait, Difficulty walking, Edema, Impaired cognition, Impaired sensation, and Pain in joint Rehab Potential: Good ELOS: 15-18 days   Today's Date: 01/28/2021 PT Individual Time: 5681-2751 PT Individual Time Calculation (min): 50 min    Hospital Problem: Active Problems:   Below-knee amputation of left lower extremity [facility])   Past Medical History:  Past Medical History:  Diagnosis Date   AICD (automatic cardioverter/defibrillator) present 01/11/2017   Anxiety    CHF (congestive heart failure) (HCC)    CKD (chronic kidney disease)    COPD (chronic obstructive pulmonary disease) ([place])    never smoker, industrial exposure   Diabetes mellitus with diabetic neuropathy, with long-term current use of insulin (HCC)    GERD (gastroesophageal reflux disease)    High cholesterol    History of gout    "take RX qd" (11/01/2017)   Hypertension    Nonobstructive atherosclerosis of coronary artery    On home oxygen therapy    "2L prn" (11/01/2017)   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, present for years   Systolic HF (heart failure) ([place])    Past Surgical History:  Past Surgical History:  Procedure Laterality Date   AMPUTATION Left 01/21/2021   Procedure: AMPUTATION BELOW KNEE;  Surgeon: Newt Minion, MD;  Location: [place];  Service: Orthopedics;  Laterality: Left;   APPLICATION OF WOUND VAC Left 01/21/2021   Procedure: APPLICATION OF WOUND VAC;  Surgeon: Newt Minion, MD;  Location: [place];  Service: Orthopedics;  Laterality: Left;   CATARACT EXTRACTION W/ INTRAOCULAR LENS IMPLANTW/ TRABECULECTOMY Left ~ 2013   "may have done this when I had my other eye OR"   [place] N/A 11/02/2017   Procedure: CORONARY STENT INTERVENTION;  Surgeon: Larey Dresser, MD;  Location: [place] CV LAB;   Service: Cardiovascular;  Laterality: N/A;   CORONARY STENT INTERVENTION N/A 11/02/2017   Procedure: CORONARY STENT INTERVENTION;  Surgeon: Belva Crome, MD;  Location: [place] CV LAB;  Service: Cardiovascular;  Laterality: N/A;   EYE SURGERY Left ~ 2013   "fell on something in basement; stuck in my eye"   ICD IMPLANT     Medtronic Dual Chamber 01/11/17   MULTIPLE TOOTH EXTRACTIONS     "pulled all my top teeth"   RIGHT/LEFT HEART CATH AND CORONARY ANGIOGRAPHY N/A 11/02/2017   Procedure: RIGHT/LEFT HEART CATH AND CORONARY ANGIOGRAPHY;  Surgeon: Larey Dresser, MD;  Location: [place] CV LAB;  Service: Cardiovascular;  Laterality: N/A;   TEE WITHOUT CARDIOVERSION N/A 01/20/2021   Procedure: TRANSESOPHAGEAL ECHOCARDIOGRAM (TEE);  Surgeon: Larey Dresser, MD;  Location: [facility] ENDOSCOPY;  Service: Cardiovascular;  Laterality: N/A;    Assessment & Plan Clinical Impression: Richard [name] is a 60 year old male with history of CKD, T2DM with neuropathy, LLL hamartoma, CKD III, COPD-2 L oxygen dependent, chronic combined CHF, chronic BLE ulcers who was admitted on 01/16/2019 with severe sepsis due to group G bacteremia and acute on chronic hypoxic respiratory failure due to CHF.  He was treated with IV diuresis as well as BiPAP with strict I's and O's recommended per cardiology.  He was started on broad-spectrum antibiotics with TEE for work-up per ID input.  TEE done on 07/28 showing EF 15 to 20% with severely dilated LV and diffuse hypokinesis but was negative  for vegetation, ASD, PFO and showed mild MR.  .   Left foot was suspected as source of infection and Dr. Sharol Given was consulted for input.Left foot CT showed swelling and soft tissue gas concerning for necrotizing infection and possibility of early osteomyelitis as well as nondisplaced calcaneal process fracture around lateral aspect.   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 As blood pressures  stabilizing, cardiac medications resumed. 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  White count has slowly trended down and ID recommended narrowing antibiotics to pen G with recommendations to continue antibiotics for  2 additional days. CT chest done due to issues with shortness of breath and showed benign lesion as well as no acute findings.  PCCM consulted and reported that this was not a new finding as mass consistent with hematoma per prior biopsy and had been slowly growing.  They recommended having patient follow-up with [facility] pulmonology after discharge.  Patient continues to be limited by anxiety due to shortness of breath with minimal activity as well as debility. Patient transferred to CIR on 01/27/2021 .   Patient currently requires max with mobility secondary to muscle weakness, decreased cardiorespiratoy endurance, and decreased standing balance and decreased balance strategies.  Prior to hospitalization, patient was independent  with mobility and lived with Spouse, Daughter, Son, Other (Comment) (Simultaneous filing. User may not have seen previous data.) in a House (Simultaneous filing. User may not have seen previous data.) home.  Home access is 3 (Simultaneous filing. User may not have seen previous data.)Stairs to enter (Simultaneous filing. User may not have seen previous data.).  Patient will benefit from skilled PT intervention to maximize safe functional mobility, minimize fall risk, and decrease caregiver burden for planned discharge home with 24 hour supervision.  Anticipate patient will benefit from follow up [place] at discharge.  PT - End of Session Activity Tolerance: Tolerates < 10 min activity with changes in vital signs Endurance Deficit: Yes Endurance Deficit Description: Pt required frequent rest breaks during self care  activity, requested to return to bed at close of session vs transfer OOB PT Assessment Rehab Potential (ACUTE/IP ONLY): Good PT Barriers to Discharge: [place] home environment;Decreased caregiver support;Home environment access/layout;Wound Care;Lack of/limited family support;Weight;Weight bearing restrictions PT Patient demonstrates impairments in the following area(s): Balance;Edema;Endurance;Pain;Sensory;Skin Integrity PT Transfers Functional Problem(s): Bed Mobility;Bed to Chair;Car;Furniture PT Locomotion Functional Problem(s): Ambulation;Wheelchair Mobility;Stairs PT Plan PT Intensity: Minimum of 1-2 x/day ,45 to 90 minutes PT Frequency: 5 out of 7 days PT Duration Estimated Length of Stay: 15-18 days PT Treatment/Interventions: Ambulation/gait training;Cognitive remediation/compensation;Community reintegration;Discharge planning;DME/adaptive equipment instruction;Functional mobility training;Neuromuscular re-education;Pain management;Patient/family education;Psychosocial support;Skin care/wound management;Stair training;Therapeutic Activities;Therapeutic Exercise;UE/LE Strength taining/ROM;UE/LE Coordination activities;Wheelchair propulsion/positioning PT Transfers Anticipated Outcome(s): supervision PT Locomotion Anticipated Outcome(s): supervision short distance gait, mod I w/c PT Recommendation Recommendations for Other Services: Neuropsych consult;Therapeutic Recreation consult Therapeutic Recreation Interventions: Stress management Follow Up Recommendations: Home health PT Patient destination: Home Equipment Recommended: Wheelchair (measurements);Rolling walker with 5" wheels   PT Evaluation Precautions/Restrictions Precautions Precautions: Fall Precaution Comments: Lt BKA with vac, PICC line, sensation deficits in B hands + Rt foot Required Braces or Orthoses: Other Brace Other Brace: L BKA shrinker and limb protector Restrictions Weight Bearing Restrictions: Yes LLE  Weight Bearing: Touchdown weight bearing General PT Amount of Missed Time (min): 10  Minutes PT Missed Treatment Reason: Patient ill (Comment) (orthostatic) Vital SignsTherapy Vitals Temp: 98 F (36.7 C) Pulse Rate: 78 Resp: 18 BP: 101/70 Patient Position (if appropriate): Lying Oxygen Therapy SpO2: 93 % O2 Device: Room Air Pain Pain Assessment Pain Scale: 0-10 Pain Score: 8  Pain Location: Knee Pain Orientation: Left Home Living/Prior Functioning Home Living Available Help at Discharge: Family;Available PRN/intermittently (Simultaneous filing. User may not have seen previous data.) Type of Home: House (Simultaneous filing. User may not have seen previous data.) Home Access: Stairs to enter (Simultaneous filing. User may not have seen previous data.) Entrance Stairs-Number of Steps: 3 (Simultaneous filing. User may not have seen previous data.) Entrance Stairs-Rails: Can reach both Home Layout: Two level;Able to live on main level with bedroom/bathroom;Full bath on main level (Simultaneous filing. User may not have seen previous data.) Bathroom Shower/Tub: Tub/shower unit;Walk-in shower (has both in bathroom on main level) Bathroom Toilet: Standard Bathroom Accessibility: Yes Additional Comments: Pt reports family will not assist him much.  Lives With: Spouse;Daughter;Son;Other (Comment) (Simultaneous filing. User may not have seen previous data.) Prior Function Level of Independence: Independent with transfers;Independent with gait (Simultaneous filing. User may not have seen previous data.)  Able to Take Stairs?: Yes Driving: Yes (Simultaneous filing. 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 Drives. Vision/Perception  Perception Perception: Within Functional Limits Praxis Praxis: Intact  Cognition Overall Cognitive Status: No family/caregiver present to determine baseline cognitive  functioning Arousal/Alertness: Awake/alert Orientation Level: Oriented to person;Oriented to place;Disoriented to situation;Disoriented to time Attention: Focused;Sustained Focused Attention: Appears intact Sustained Attention: Appears intact Memory: Impaired Immediate Memory Recall: Sock;Blue;Bed Memory Recall Sock: Without Cue Memory Recall Blue: With Cue Memory Recall Bed: With Cue Awareness: Impaired Awareness Impairment: Intellectual impairment Problem Solving: Appears intact Safety/Judgment: Appears intact [employer] Scales of Cognitive Functioning: Localized response Sensation Sensation Light Touch: Impaired by gross assessment Light Touch Impaired Details: Absent RLE;Impaired RLE;Impaired RUE;Impaired LUE (pt reports/exhibits abnormal sensation in Rt LE, also impaired sensation in B hands) Hot/Cold: Appears Intact Proprioception: Impaired by gross assessment Coordination Gross Motor Movements are Fluid and Coordinated: No Fine Motor Movements are Fluid and Coordinated: Yes Coordination and Movement Description: Affected by altered center of mass due to amputation, pain, and sensation deficits Finger Nose Finger Test: not tested Motor  Motor Motor: Other (comment) Motor - Skilled Clinical Observations: Affected by altered center of mass and resulting balance deficits, pain, sensation deficits, and generalized weakness/limited activity tolerance   Trunk/Postural Assessment  Cervical Assessment Cervical Assessment: Exceptions to [facility] (forward head) Thoracic Assessment Thoracic Assessment: Exceptions to [facility] (Rt trunk elongation in sitting) Lumbar Assessment Lumbar Assessment: Exceptions to [facility] (posterior pelvic tilt) Postural Control Postural Control: Deficits on evaluation (limited in standing due to amputation)  Balance Balance Balance Assessed: Yes Dynamic Sitting Balance Dynamic Sitting - Balance Support: No upper extremity supported Dynamic Sitting - Level  of Assistance: 4: Min assist (trying to remove his gripper sock) Sitting balance - Comments: Static sitting with supervision, able to perform lateral weight shifts without LOB Dynamic Standing Balance Dynamic Standing - Balance Support: Right upper extremity supported;During functional activity Dynamic Standing - Level of Assistance: 4: Min assist;3: Mod assist Dynamic Standing - Balance Activities: Forward lean/weight shifting;Lateral lean/weight shifting (elevating pants over hips) Extremity Assessment  RUE Assessment RUE Assessment: Within Functional Limits (+sensation deficits) LUE Assessment LUE Assessment: Within Functional Limits (+ sensation deficits) RLE Assessment RLE Assessment: Exceptions to [place]  Strength Comments: 4-/5 overall LLE Assessment LLE Assessment: Not tested (Not tested dt amputation) General Strength Comments: Not tested d/t pain and WB precautions  Care Tool Care Tool Bed Mobility Roll left and right activity   Roll left and right assist level: Supervision/Verbal cueing    Sit to lying activity   Sit to lying assist level: Minimal Assistance - Patient > 75%    Lying to sitting edge of bed activity   Lying to sitting edge of bed assist level: Minimal Assistance - Patient > 75%     Care Tool Transfers Sit to stand transfer   Sit to stand assist level: Maximal Assistance - Patient 25 - 49%    Chair/bed transfer Chair/bed transfer activity did not occur: Safety/medical concerns (pt became orthostatic)       Toilet transfer Toilet transfer activity did not occur: N/A (not attempted due to time constraints)      Scientist, product/process development transfer activity did not occur: Safety/medical concerns        Care Tool Locomotion Ambulation Ambulation activity did not occur: Safety/medical concerns        Walk 10 feet activity Walk 10 feet activity did not occur: Safety/medical concerns       Walk 50 feet with 2 turns activity Walk 50 feet with 2 turns  activity did not occur: Safety/medical concerns      Walk 150 feet activity Walk 150 feet activity did not occur: Safety/medical concerns      Walk 10 feet on uneven surfaces activity Walk 10 feet on uneven surfaces activity did not occur: Safety/medical concerns      Stairs Stair activity did not occur: Safety/medical concerns        Walk up/down 1 step activity Walk up/down 1 step or curb (drop down) activity did not occur: Safety/medical concerns     Walk up/down 4 steps activity did not occuR: Safety/medical concerns  Walk up/down 4 steps activity      Walk up/down 12 steps activity Walk up/down 12 steps activity did not occur: Safety/medical concerns      Pick up small objects from floor Pick up small object from the floor (from standing position) activity did not occur: Safety/medical concerns      Wheelchair Will patient use wheelchair at discharge?: Yes Type of Wheelchair: Manual Wheelchair activity did not occur: Safety/medical concerns      Wheel 50 feet with 2 turns activity Wheelchair 50 feet with 2 turns activity did not occur: Safety/medical concerns    Wheel 150 feet activity Wheelchair 150 feet activity did not occur: Safety/medical concerns      Refer to Care Plan for Long Term Goals  SHORT TERM GOAL WEEK 1 PT Short Term Goal 1 (Week 1): Pt will initiate gait training with LRAD PT Short Term Goal 2 (Week 1): Pt will tolerate >15 minutes of axtivity without change in vital signs PT Short Term Goal 3 (Week 1): Pt will propel w/c x 150 ft  Recommendations for other services: Therapeutic Recreation  Stress management  Skilled Therapeutic Intervention Mobility Bed Mobility Bed Mobility: Sit to Supine Supine to Sit: Minimal Assistance - Patient > 75% Sit to Supine: Minimal Assistance - Patient > 75% Transfers Transfers: Sit to Stand;Stand to Sit Sit to Stand: Maximal Assistance - Patient 25-49% Stand to Sit: Moderate Assistance - Patient  50-74% Transfer (Assistive device): Rolling walker Locomotion  Gait Gait Distance (Feet): 0 Feet Stairs / Additional Locomotion Stairs: No Wheelchair Mobility Wheelchair Mobility: No  Evaluation completed (see details above and below) with education on PT POC and goals and individual treatment initiated with focus on initiating functional mobility. Pt greeted in bed asleep but easily rousable. Pt performed bed mobility with assist of bed rail, supervision. Strength and sensation testing completed while sitting EOB. Pt performed STS with max A to RW, reported dizziness that did not improve. Pt returned to supine, BP in supine= 85/55, did not rise after several minutes of lying flat. O2 desat to 74%, rose to 100% on 2L O2. Pt's nurse alerted, O2 raised to 4L on nurse's direction. Pt left with all needs in reach and bed alarm active.    Discharge Criteria: Patient will be discharged from PT if patient refuses treatment 3 consecutive times without medical reason, if treatment goals not met, if there is a change in medical status, if patient makes no progress towards goals or if patient is discharged from hospital.  The above assessment, treatment plan, treatment alternatives and goals were discussed and mutually agreed upon: by patient  Mickel Fuchs 01/28/2021, 11:49 AM

## 2021-01-28 NOTE — Progress Notes (Signed)
PROGRESS NOTE   Subjective/Complaints: Richard [name] has no complaints this morning WBC is trending downward, Na improving, creatinine normalized Tolerated PT eval well today, but was limited by dizziness and BP in supine was 85/55  ROS: +dizziness  Objective:   No results found. Recent Labs    01/27/21 0710 01/28/21 0411  WBC 15.2* 12.0*  HGB 13.9 13.4  HCT 43.0 41.2  PLT 378 352   Recent Labs    01/27/21 0710 01/28/21 0411  NA 131* 132*  K 4.7 4.5  CL 90* 91*  CO2 33* 33*  GLUCOSE 131* 86  BUN 39* 39*  CREATININE 1.34* 1.22  CALCIUM 8.8* 8.6*    Intake/Output Summary (Last 24 hours) at 01/28/2021 1253 Last data filed at 01/28/2021 0820 Gross per 24 hour  Intake 177 ml  Output 925 ml  Net -748 ml        Physical Exam: Vital Signs Blood pressure 101/70, pulse 78, temperature 98 F (36.7 C), resp. rate 18, height 6\' 5"  (1.956 m), weight 112.2 kg, SpO2 93 %. Gen: no distress, normal appearing HEENT: oral mucosa pink and moist, NCAT Cardio: Reg rate Chest: normal effort, normal rate of breathing Abd: soft, non-distended Ext: no edema Psych: pleasant, normal affect Musculoskeletal:        General: Swelling and tenderness present.    Cervical back: Normal range of motion and neck supple. Skin:    Comments: Left BKA with vac dressing. Limb contained in limb guard.   Neurological:    Cranial Nerves: Cranial nerve deficit present.    Comments: Pt a little lethargic. Did awaken. Followed commands. UE 4-5/5. RLE 3-4/5 , LLE limited by pain. No focal sensory findings. Cerebellar normal. No abnormal tone.   Psychiatric:    Comments: Pt a little flat but cooperative with exam and questioning    Assessment/Plan: 1. Functional deficits which require 3+ hours per day of interdisciplinary therapy in a comprehensive inpatient rehab setting. Physiatrist is providing close team supervision and 24 hour management of  active medical problems listed below. Physiatrist and rehab team continue to assess barriers to discharge/monitor patient progress toward functional and medical goals  Care Tool:  Bathing    Body parts bathed by patient: Right arm, Left arm, Chest, Abdomen, Front perineal area, Buttocks, Right upper leg, Face   Body parts bathed by helper: Right lower leg Body parts n/a: Left upper leg, Left lower leg   Bathing assist Assist Level: Minimal Assistance - Patient > 75%     Upper Body Dressing/Undressing Upper body dressing   What is the patient wearing?: Pull over shirt    Upper body assist Assist Level: Minimal Assistance - Patient > 75%    Lower Body Dressing/Undressing Lower body dressing      What is the patient wearing?: Pants     Lower body assist Assist for lower body dressing: Moderate Assistance - Patient 50 - 74%     Toileting Toileting Toileting Activity did not occur (Clothing management and hygiene only): N/A (no void or bm)  Toileting assist Assist for toileting: Set up assist Assistive Device Comment: urinal   Transfers Chair/bed transfer  Transfers assist  Chair/bed transfer  activity did not occur: Safety/medical concerns (pt became orthostatic)  Chair/bed transfer assist level: Moderate Assistance - Patient 50 - 74%     Locomotion Ambulation   Ambulation assist   Ambulation activity did not occur: Safety/medical concerns          Walk 10 feet activity   Assist  Walk 10 feet activity did not occur: Safety/medical concerns        Walk 50 feet activity   Assist Walk 50 feet with 2 turns activity did not occur: Safety/medical concerns         Walk 150 feet activity   Assist Walk 150 feet activity did not occur: Safety/medical concerns         Walk 10 feet on uneven surface  activity   Assist Walk 10 feet on uneven surfaces activity did not occur: Safety/medical concerns         Wheelchair     Assist Will  patient use wheelchair at discharge?: Yes Type of Wheelchair: Manual Wheelchair activity did not occur: Safety/medical concerns         Wheelchair 50 feet with 2 turns activity    Assist    Wheelchair 50 feet with 2 turns activity did not occur: Safety/medical concerns       Wheelchair 150 feet activity     Assist  Wheelchair 150 feet activity did not occur: Safety/medical concerns       Blood pressure 101/70, pulse 78, temperature 98 F (36.7 C), resp. rate 18, height 6\' 5"  (1.956 m), weight 112.2 kg, SpO2 93 %.    Medical Problem List and Plan: 1.  Functional and mobility deficits secondary to left BKA and multiple medical             -patient may not yet shower             -ELOS/Goals: 14-16 days, supervision goals  -Initial CIR evals today 2.  Antithrombotics: -DVT/anticoagulation:  Pharmaceutical: Lovenox             -antiplatelet therapy: Continue ASA 3. Pain Management: pt with incisional and phantom pain             -tylenol for mild pain, hydrocodone for moderate to severe pain             -robaxin for spasms             -add gabapentin for phantom limb pain 4. Mood: LCSW to follow for evaluation and support.              -antipsychotic agents: N/A 5. Neuropsych: This patient is capable of making decisions on his own behalf. 6. Skin/Wound Care: Routine pressure relief measures.  7. Fluids/Electrolytes/Nutrition: Strict I/O. Continue Zinc and Juven to promote wound healing 8. Leucocytosis/Bacteremia: WBC trending down.  Continue antibiotics for 2 more days.             -- Recheck CBC in AM. 9.  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             -- Encourage pulmonary hygiene.  -continue oxygen to keep sats between 88 and 92%.  10.  Chronic combined CHF: Check daily weights.  Monitor for signs of overload.             -- On Zebeta, hydralazine, Entresto, Jardiance, Aldactone, Imdur, Lipitor and ASA 11.   T2DM: Monitor blood sugars ac/hs and2 use SSI for elevated BS             --  8/5 CBGs 80-176: Continue Lantus bid w/Jardiance and 4 units novolog for meal coverage 12. Anxiety d/o: Team to provide ego support.             --on buspar and cymbalta. 13. Constipation: Has been on IV reglan and Senna bid 14. Ongoing hiccups: Continue baclofen tid prn--may need it scheduled  15. Orthostatic hypotension: orthostatic vitals ordered daily at 0730. Discontinue hydralazing 10mg  q8H.   LOS: 1 days A FACE TO FACE EVALUATION WAS PERFORMED  Richard [name] 01/28/2021, 12:53 PM

## 2021-01-28 NOTE — Progress Notes (Signed)
Occupational Therapy Session Note  Patient Details  Name: Richard [name] MRN: [id number] Date of Birth: [birth year]  Today's Date: 01/29/2021 OT Individual Time: 1700-1749 and [id number] OT Individual Time Calculation (min): 59 min and 41 min  Short Term Goals: Week 1:  OT Short Term Goal 1 (Week 1): Pt will complete toilet or BSC transfer using LRAD and Min A OT Short Term Goal 2 (Week 1): Pt will maintain dynamic standing balance during LB ADL with no more than Min A during 2 consecutive sessions OT Short Term Goal 3 (Week 1): Pt will state 2 strategies to impement at home during daily routine for diabetes management (i.e. diet, daily limb inspection, etc)  Skilled Therapeutic Interventions/Progress Updates:    Pt greeted while EOB, declining participation in bathing/dressing or toileting today. Opting to start session by completing oral care/grooming tasks while seated EOB. Setup for supine<sit from flat bed without bedrails per setup at home. Pt reports having a king size bed that isn't very high. Setup for oral care/grooming tasks with increased time for pt to execute motor demands due to sensation deficits in hands, pt has to compensate with visual strategies. He c/o BSC that he used last night with nursing staff, reported that it was uncomfortable and too small. OT retrieved a padded drop arm BSC that was a little wider than the one he had in the room. Min A for stand pivot<BSC with vcs for hand placement during power up when using RW. Pt reported liking this BSC option must better. 02 sats 94-95% on RA post transfer. He donned his sneaker with Min A and then completed another stand pivot<bed. While EOB, pt was guided through Rt LE ROM exercises with mirror strategically placed for mirror therapy. Discussed ways that he can incorporate mirror therapy strategies at home to self manage phantom pain. He then returned to bed, declined transfering to the recliner, states that he sits EOB often. OT  encouraged this. Mild dizziness reported during session and we discussed frequent positional changes in the day to address this. He remained in bed at close of session, all needs within reach and bed alarm set.   2nd Session 1:1 tx (41 min) Pt greeted while sitting in the recliner, rating residual limb pain as 8/10 however premedicated. He was agreeable to participate in seated exercises for UB strengthening. Guided him through UB exercises using green tband x20 reps 1 set each exercise. Encouraged him to sit unsupported in the chair for strengthening of abdominal muscles. Note that pt required several rest break in between reps due to decreased cardiopulmonary endurance. He is a bit tangential, requires cues for redirection to task. Pt required onset of nausea, requested to return to bed. Short distance ambulatory transfer completed with CGA, pt using hop to gait pattern, OT managing IV pole + wound vac. Pt returned to bed and was left in care of RN. OT provided him with an antinausea aromatherapy blend per pt request. Tx focus placed on UB strengthening for functional carryover during transfers + self care activity.   Therapy Documentation Precautions:  Precautions Precautions: Fall Precaution Comments: Lt BKA with vac, PICC line, sensation deficits in B hands + Rt foot Required Braces or Orthoses: Other Brace Other Brace: L BKA shrinker and limb protector Restrictions Weight Bearing Restrictions: Yes LLE Weight Bearing: Touchdown weight bearing  Vital Signs: Therapy Vitals Temp: 98.1 F (36.7 C) Temp Source: Oral Pulse Rate: 71 Resp: 18 BP: 91/61 Patient Position (if appropriate): Lying Oxygen Therapy SpO2: 91 %  O2 Device: Room Air Pain: 8/10 in residual limb, notified RN at end of 1st session for pain medicine    ADL: ADL Eating: Not assessed Grooming: Setup Where Assessed-Grooming: Edge of bed Upper Body Bathing: Supervision/safety Where Assessed-Upper Body Bathing: Edge of  bed Lower Body Bathing: Minimal assistance Where Assessed-Lower Body Bathing: Edge of bed Upper Body Dressing: Minimal assistance Where Assessed-Upper Body Dressing: Edge of bed Lower Body Dressing: Moderate assistance Where Assessed-Lower Body Dressing: Edge of bed Toileting: Not assessed Toilet Transfer: Not assessed Tub/Shower Transfer: Not assessed           :     Therapy/Group: Individual Therapy  Resean Brander A Lenwood Balsam 01/29/2021, 3:53 PM

## 2021-01-28 NOTE — Plan of Care (Signed)
  Problem: RH Balance Goal: LTG Patient will maintain dynamic standing with ADLs (OT) Description: LTG:  Patient will maintain dynamic standing balance with assist during activities of daily living (OT)  Flowsheets (Taken 01/28/2021 1158) LTG: Pt will maintain dynamic standing balance during ADLs with: Supervision/Verbal cueing   Problem: Sit to Stand Goal: LTG:  Patient will perform sit to stand in prep for activites of daily living with assistance level (OT) Description: LTG:  Patient will perform sit to stand in prep for activites of daily living with assistance level (OT) Flowsheets (Taken 01/28/2021 1158) LTG: PT will perform sit to stand in prep for activites of daily living with assistance level: Supervision/Verbal cueing   Problem: RH Bathing Goal: LTG Patient will bathe all body parts with assist levels (OT) Description: LTG: Patient will bathe all body parts with assist levels (OT) Flowsheets (Taken 01/28/2021 1158) LTG: Pt will perform bathing with assistance level/cueing: Supervision/Verbal cueing   Problem: RH Dressing Goal: LTG Patient will perform upper body dressing (OT) Description: LTG Patient will perform upper body dressing with assist, with/without cues (OT). Flowsheets (Taken 01/28/2021 1158) LTG: Pt will perform upper body dressing with assistance level of: Supervision/Verbal cueing Goal: LTG Patient will perform lower body dressing w/assist (OT) Description: LTG: Patient will perform lower body dressing with assist, with/without cues in positioning using equipment (OT) Flowsheets (Taken 01/28/2021 1158) LTG: Pt will perform lower body dressing with assistance level of: Supervision/Verbal cueing   Problem: RH Toileting Goal: LTG Patient will perform toileting task (3/3 steps) with assistance level (OT) Description: LTG: Patient will perform toileting task (3/3 steps) with assistance level (OT)  Flowsheets (Taken 01/28/2021 1158) LTG: Pt will perform toileting task (3/3  steps) with assistance level: Supervision/Verbal cueing   Problem: RH Toilet Transfers Goal: LTG Patient will perform toilet transfers w/assist (OT) Description: LTG: Patient will perform toilet transfers with assist, with/without cues using equipment (OT) Flowsheets (Taken 01/28/2021 1158) LTG: Pt will perform toilet transfers with assistance level of: Supervision/Verbal cueing   Problem: RH Tub/Shower Transfers Goal: LTG Patient will perform tub/shower transfers w/assist (OT) Description: LTG: Patient will perform tub/shower transfers with assist, with/without cues using equipment (OT) Flowsheets (Taken 01/28/2021 1158) LTG: Pt will perform tub/shower stall transfers with assistance level of: Supervision/Verbal cueing

## 2021-01-28 NOTE — Progress Notes (Signed)
Inpatient Rehabilitation Care Coordinator Assessment and Plan Patient Details  Name: Richard [name] MRN: [id number] Date of Birth: [birth year]  Today's Date: 01/28/2021  Hospital Problems: Active Problems:   Below-knee amputation of left lower extremity [facility])  Past Medical History:  Past Medical History:  Diagnosis Date   AICD (automatic cardioverter/defibrillator) present 01/11/2017   Anxiety    CHF (congestive heart failure) (HCC)    CKD (chronic kidney disease)    COPD (chronic obstructive pulmonary disease) (HCC)    never smoker, industrial exposure   Diabetes mellitus with diabetic neuropathy, with long-term current use of insulin (HCC)    GERD (gastroesophageal reflux disease)    High cholesterol    History of gout    "take RX qd" (11/01/2017)   Hypertension    Nonobstructive atherosclerosis of coronary artery    On home oxygen therapy    "2L prn" (11/01/2017)   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HCC)    LLL, present for years   Systolic HF (heart failure) (HCC)    Past Surgical History:  Past Surgical History:  Procedure Laterality Date   AMPUTATION Left 01/21/2021   Procedure: AMPUTATION BELOW KNEE;  Surgeon: Nadara Mustard, MD;  Location: [facility] OR;  Service: Orthopedics;  Laterality: Left;   APPLICATION OF WOUND VAC Left 01/21/2021   Procedure: APPLICATION OF WOUND VAC;  Surgeon: Nadara Mustard, MD;  Location: [facility] OR;  Service: Orthopedics;  Laterality: Left;   CATARACT EXTRACTION W/ INTRAOCULAR LENS IMPLANTW/ TRABECULECTOMY Left ~ 2013   "may have done this when I had my other eye OR"   CIRCUMCISION     CORONARY STENT INTERVENTION N/A 11/02/2017   Procedure: CORONARY STENT INTERVENTION;  Surgeon: Laurey Morale, MD;  Location: [facility] INVASIVE CV LAB;  Service: Cardiovascular;  Laterality: N/A;   CORONARY STENT INTERVENTION N/A 11/02/2017   Procedure: CORONARY STENT INTERVENTION;  Surgeon: Lyn Records, MD;  Location: [facility] INVASIVE CV LAB;  Service: Cardiovascular;  Laterality: N/A;    EYE SURGERY Left ~ 2013   "fell on something in basement; stuck in my eye"   ICD IMPLANT     Medtronic Dual Chamber 01/11/17   MULTIPLE TOOTH EXTRACTIONS     "pulled all my top teeth"   RIGHT/LEFT HEART CATH AND CORONARY ANGIOGRAPHY N/A 11/02/2017   Procedure: RIGHT/LEFT HEART CATH AND CORONARY ANGIOGRAPHY;  Surgeon: Laurey Morale, MD;  Location: [facility] INVASIVE CV LAB;  Service: Cardiovascular;  Laterality: N/A;   TEE WITHOUT CARDIOVERSION N/A 01/20/2021   Procedure: TRANSESOPHAGEAL ECHOCARDIOGRAM (TEE);  Surgeon: Laurey Morale, MD;  Location: [facility] ENDOSCOPY;  Service: Cardiovascular;  Laterality: N/A;   Social History:  reports that he has quit smoking. His smoking use included cigarettes. He has never used smokeless tobacco. He reports previous alcohol use. He reports that he does not use drugs.  Family / Support Systems Marital Status: Divorced Patient Roles: Partner, Parent Spouse/Significant Other: [name] Napper-girlfriend [phone] Children: Pt has eight children range in age from 23-21 yo 36 yo daughter lives with him Other Supports: Helen-Mom [phone] Anticipated Caregiver: Self Ability/Limitations of Caregiver: [name] and family can only provide supervision level and may be alone at times Caregiver Availability: Other (Comment) (At times may be alone) Family Dynamics: Close knit with his chidlren he reports he see's and or they call him to check on him. [name] is supportive and will make sure he has what he needs at discharge.  Social History Preferred language: English Religion: Baptist Cultural Background: No issues Education: HS Read: Yes  Write: Yes Employment Status: Disabled Marine scientist Issues: No issues Guardian/Conservator: None-according to MD pt is capable of making his own decisions while here   Abuse/Neglect Abuse/Neglect Assessment Can Be Completed: Yes Physical Abuse: Denies Verbal Abuse: Denies Sexual Abuse: Denies Exploitation of  patient/patient's resources: Denies Self-Neglect: Denies  Emotional Status Pt's affect, behavior and adjustment status: Pt reports he manages and was still independent even with his need for chronic 02. He stays home mostly but doies get out. He wants to get back to his independent level before he goes home. He is a large man and feels a wheelchair may not be accessible in his home Recent Psychosocial Issues: other health issues-chronic 02 put still was independent Psychiatric History: History of anxiety takes medications for and feels they help but he is having more with his amputation due to not moving as well. Will see if neuro-psych will see while here Substance Abuse History: No issues  Patient / Family Perceptions, Expectations & Goals Pt/Family understanding of illness & functional limitations: Pt can explain his amputation and feels he has a good understanding of his plan going forward. He is hopeful he will heal and get a prothesis soon but know it is all dependent on his healing. 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, broadcasting/film/video, father, retiree, friend, etc Anticipated changes in roles/activities/participation: resume Pt/family expectations/goals: Pt states: " I want to be moving with a walker, I don;t want a wheelchair, it won't fit in my house."  [place] states: " I hope he does well there."  [employer]: None Premorbid Home Care/DME Agencies: Other (Comment) ([employer] [facility] O2 and neb at home) Transportation available at discharge: Self will rely upon family and mary now Resource referrals recommended: Neuropsychology  Discharge Planning Living Arrangements: Spouse/significant other, Children Support Systems: Spouse/significant other, Children, Other relatives, Friends/neighbors Type of Residence: Private residence Insurance Resources: [employer], [employer] (specify county) Surveyor, quantity Resources: [employer] Screen Referred: No Living Expenses: Psychologist, sport and exercise  Management: Patient, Significant Other Does the patient have any problems obtaining your medications?: No Home Management: [name] Patient/Family Preliminary Plans: Return home with [name] and daughter who lives with him. There will be times he will be alone and needs to be safe. He wants to just need a rolling walker instead of a wheelchair does not feel it is accessible in his home. Will await therapy evaluations and work on discharge needs. Care Coordinator Anticipated Follow Up Needs: HH/OP  Clinical Impression Pleasant motivated gentleman who is wiling to work in therapies to achieve his goal of mod/I with rolling walker. His partner-mary and children are supportive and will assist some. Await therapy evaluations and work on discharge needs. Due to pt's large size a wheelchair will probably not be functional in his home.  Lucy Chris 01/28/2021, 12:28 PM

## 2021-01-28 NOTE — Progress Notes (Signed)
Dreamstation bipap unit in room if needed. Pt declined at this time because he was already sleeping good. Told me earlier that he wanted to wear it tonight so unit was brought to his room; but now he has changed his mind. Told him to call if he wanted unit put on.

## 2021-01-28 NOTE — Progress Notes (Signed)
Patient with ruptured blister? On right foot. Foot appears to have mild erythema with multiple dry ulcerated areas. Tibia with dime size open ulcerated area and stasis changes. Will order Aquacell with dry dressing for antimicrobial properties. Concated Dr. Lajoyce Corners for consult/evaluation/dressing changes.

## 2021-01-28 NOTE — Progress Notes (Signed)
Inpatient Rehabilitation Center Individual Statement of Services  Patient Name:  Richard [name]  Date:  01/28/2021  Welcome to the Inpatient Rehabilitation Center.  Our goal is to provide you with an individualized program based on your diagnosis and situation, designed to meet your specific needs.  With this comprehensive rehabilitation program, you will be expected to participate in at least 3 hours of rehabilitation therapies Monday-Friday, with modified therapy programming on the weekends.  Your rehabilitation program will include the following services:  Physical Therapy (PT), Occupational Therapy (OT), 24 hour per day rehabilitation nursing, Neuropsychology, Care Coordinator, Rehabilitation Medicine, Nutrition Services, and Pharmacy Services  Weekly team conferences will be held on Tuesday to discuss your progress.  Your Inpatient Rehabilitation Care Coordinator will talk with you frequently to get your input and to update you on team discussions.  Team conferences with you and your family in attendance may also be held.  Expected length of stay: 15-18 Days  Overall anticipated outcome: supervision to mod/I wheelchair level  Depending on your progress and recovery, your program may change. Your Inpatient Rehabilitation Care Coordinator will coordinate services and will keep you informed of any changes. Your Inpatient Rehabilitation Care Coordinator's name and contact numbers are listed  below.  The following services may also be recommended but are not provided by the Inpatient Rehabilitation Center:  Driving Evaluations Home Health Rehabiltiation Services Outpatient Rehabilitation Services    Arrangements will be made to provide these services after discharge if needed.  Arrangements include referral to agencies that provide these services.  Your insurance has been verified to be:  Medicare & medicaid Your primary doctor is:  Alehegn Asres  Pertinent information will be shared with your  doctor and your insurance company.  Inpatient Rehabilitation Care Coordinator:  Dossie Der, [place] [phone] or Luna Glasgow  Information discussed with and copy given to patient by: Lucy Chris, 01/28/2021, 12:30 PM

## 2021-01-29 DIAGNOSIS — I951 Orthostatic hypotension: Secondary | ICD-10-CM

## 2021-01-29 LAB — GLUCOSE, CAPILLARY
Glucose-Capillary: 146 mg/dL — ABNORMAL HIGH (ref 70–99)
Glucose-Capillary: 149 mg/dL — ABNORMAL HIGH (ref 70–99)
Glucose-Capillary: 186 mg/dL — ABNORMAL HIGH (ref 70–99)
Glucose-Capillary: 242 mg/dL — ABNORMAL HIGH (ref 70–99)

## 2021-01-29 NOTE — Progress Notes (Signed)
PROGRESS NOTE   Subjective/Complaints: No new issues overnight. Pt reported that pain is controlled. RN reports s/s drainage from vac. RLE with ulceration and chronic changes. Dressing changed yesterday. Ortho contacted about foot  ROS: Patient denies fever, rash, sore throat, blurred vision, nausea, vomiting, diarrhea, cough, shortness of breath or chest pain,   headache, or mood change.   Objective:   No results found. Recent Labs    01/27/21 0710 01/28/21 0411  WBC 15.2* 12.0*  HGB 13.9 13.4  HCT 43.0 41.2  PLT 378 352   Recent Labs    01/27/21 0710 01/28/21 0411  NA 131* 132*  K 4.7 4.5  CL 90* 91*  CO2 33* 33*  GLUCOSE 131* 86  BUN 39* 39*  CREATININE 1.34* 1.22  CALCIUM 8.8* 8.6*    Intake/Output Summary (Last 24 hours) at 01/29/2021 1021 Last data filed at 01/29/2021 0723 Gross per 24 hour  Intake 908.63 ml  Output 730 ml  Net 178.63 ml        Physical Exam: Vital Signs Blood pressure 113/78, pulse 76, temperature 98.3 F (36.8 C), temperature source Oral, resp. rate 18, height 6\' 5"  (1.956 m), weight 112.2 kg, SpO2 94 %. Constitutional: No distress . Vital signs reviewed. HEENT: NCAT, EOMI, oral membranes moist Neck: supple Cardiovascular: RRR without murmur. No JVD    Respiratory/Chest: CTA Bilaterally without wheezes or rales. Normal effort    GI/Abdomen: BS +, non-tender, non-distended Ext: no clubbing, cyanosis, or edema Psych: flat bu cooperative  Musculoskeletal:        General: Swelling and tenderness present.    Cervical back: Normal range of motion and neck supple. Skin:    Comments: Left BKA with vac dressing. S/s drainage in vac 200cc -right foot with large ulcerating/separating superfiical layer with underlying dermis. No odor. No obvious tunneling.   Neurological:    Cranial Nerves: Cranial nerve deficit present.    Comments: Pt a little lethargic. Did awaken. Followed commands.  UE 4-5/5. RLE 3-4/5 , LLE limited by pain. No focal sensory findings. Cerebellar normal. No abnormal tone.       Assessment/Plan: 1. Functional deficits which require 3+ hours per day of interdisciplinary therapy in a comprehensive inpatient rehab setting. Physiatrist is providing close team supervision and 24 hour management of active medical problems listed below. Physiatrist and rehab team continue to assess barriers to discharge/monitor patient progress toward functional and medical goals  Care Tool:  Bathing    Body parts bathed by patient: Right arm, Left arm, Chest, Abdomen, Front perineal area, Buttocks, Right upper leg, Face   Body parts bathed by helper: Right lower leg Body parts n/a: Left upper leg, Left lower leg   Bathing assist Assist Level: Minimal Assistance - Patient > 75%     Upper Body Dressing/Undressing Upper body dressing   What is the patient wearing?: Pull over shirt    Upper body assist Assist Level: Minimal Assistance - Patient > 75%    Lower Body Dressing/Undressing Lower body dressing      What is the patient wearing?: Pants     Lower body assist Assist for lower body dressing: Moderate Assistance - Patient 50 -  74%     Toileting Toileting Toileting Activity did not occur Press photographer and hygiene only): N/A (no void or bm)  Toileting assist Assist for toileting: Set up assist Assistive Device Comment: urinal   Transfers Chair/bed transfer  Transfers assist  Chair/bed transfer activity did not occur: Safety/medical concerns (pt became orthostatic)  Chair/bed transfer assist level: Moderate Assistance - Patient 50 - 74%     Locomotion Ambulation   Ambulation assist   Ambulation activity did not occur: Safety/medical concerns          Walk 10 feet activity   Assist  Walk 10 feet activity did not occur: Safety/medical concerns        Walk 50 feet activity   Assist Walk 50 feet with 2 turns activity did not  occur: Safety/medical concerns         Walk 150 feet activity   Assist Walk 150 feet activity did not occur: Safety/medical concerns         Walk 10 feet on uneven surface  activity   Assist Walk 10 feet on uneven surfaces activity did not occur: Safety/medical concerns         Wheelchair     Assist Will patient use wheelchair at discharge?: Yes Type of Wheelchair: Manual Wheelchair activity did not occur: Safety/medical concerns         Wheelchair 50 feet with 2 turns activity    Assist    Wheelchair 50 feet with 2 turns activity did not occur: Safety/medical concerns       Wheelchair 150 feet activity     Assist  Wheelchair 150 feet activity did not occur: Safety/medical concerns       Blood pressure 113/78, pulse 76, temperature 98.3 F (36.8 C), temperature source Oral, resp. rate 18, height 6\' 5"  (1.956 m), weight 112.2 kg, SpO2 94 %.    Medical Problem List and Plan: 1.  Functional and mobility deficits secondary to left BKA and multiple medical             -patient may not yet shower             -ELOS/Goals: 14-16 days, supervision goals  -Continue CIR therapies including PT, OT  2.  Antithrombotics: -DVT/anticoagulation:  Pharmaceutical: Lovenox             -antiplatelet therapy: Continue ASA 3. Pain Management: pt with incisional and phantom pain             -tylenol for mild pain, hydrocodone for moderate to severe pain             -robaxin for spasms             -add gabapentin for phantom limb pain 4. Mood: LCSW to follow for evaluation and support.              -antipsychotic agents: N/A 5. Neuropsych: This patient is capable of making decisions on his own behalf. 6. Skin/Wound Care: Vac to left stump  -aquacell/xeroform to foot. Keep area clean. Requested ortho follow up  -NWB RLE for now 7. Fluids/Electrolytes/Nutrition: Strict I/O. Continue Zinc and Juven to promote wound healing 8. Leucocytosis/Bacteremia: WBC  trending down.  Continue antibiotics for 2 more days.             --  wbc's decreased  to 12k 9.  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             -- Encourage pulmonary  hygiene.  -continue oxygen to keep sats between 88 and 92%.  10.  Chronic combined CHF: Check daily weights.  Monitor for signs of overload.             -- On Zebeta, hydralazine, Entresto, Jardiance, Aldactone, Imdur, Lipitor and ASA 11.  T2DM: Monitor blood sugars ac/hs and2 use SSI for elevated BS             --8/7 CBGs borderline control. Continue Lantus bid w/Jardiance and 4 units novolog for meal coverage 12. Anxiety d/o: Team to provide ego support.             --on buspar and cymbalta. 13. Constipation: Has been on IV reglan and Senna bid 14. Ongoing hiccups: Continue baclofen tid prn--may need it scheduled  15. Orthostatic hypotension: orthostatic vitals ordered daily at 0730. Discontinue hydralazing 10mg  q8H.   LOS: 2 days A FACE TO FACE EVALUATION WAS PERFORMED  01/29/2021, 10:21 AM

## 2021-01-29 NOTE — Progress Notes (Signed)
Physical Therapy Session Note  Patient Details  Name: Richard [name] MRN: [id number] Date of Birth: 1960/11/18  Today's Date: 01/29/2021 PT Individual Time:  Session 1: 1000-1057 Session 2: 5462-7035  PT Individual Time Calculation (min):  Session 1: 57 min Session 2: 15 min   Short Term Goals: Week 1:  PT Short Term Goal 1 (Week 1): Pt will initiate gait training with LRAD PT Short Term Goal 2 (Week 1): Pt will tolerate >15 minutes of axtivity without change in vital signs PT Short Term Goal 3 (Week 1): Pt will propel w/c x 150 ft  Skilled Therapeutic Interventions/Progress Updates:  Session 1: Pt received sitting EOB with request for toileting. Pt assisted to WC with MinA + RW for SPT. Pt then assisted into bathroom to practice toilet transfer, where he required MinA + B handrail assist. Pt given privacy for toileting where he was independent with self care. Pt returned to [facility] with MinA + B handrail assist. Pt then c/o increased nausea, so RN called to room for medication. Pt agreed to continue session in room to optimize opportunity for therapy. Pt practice 5 x sit to stand with Min A + RW progressing to CGA + RW to practice functional transfers relevant to ambulation. Of note, pt required significant rest between rounds to manage nausea. Pt then stood and completed alternating reaches outside base of session with CGA + support of RW. Pt again c/o nausea, and thus transferred to recliner with Min A + RW where he completed 2 x 10 quad sets. At end of session, pt was left seated in recliner with legs elevated, nurse call bell and all needs in reach.  Session 2: Pt received supine in bed and c/o high nausea. Thus, PT allowed pt to rest and returned later for second session. PT returned after 45 mins, and pt was agreeable to attempt therapy. Pt sat at EOB and continued to c/o of nausea. RN consulted, who stated pt had maximum medication allowed. Pt then practiced 3x sit to stand with CGA + RW. Pt was  able to shift to R side with 2 small hops, but then c/o higher nausea. Thus, pt was returned to bed and session was discontinued. Pt was left supine in bed with alarm engaged, call bell and all needs in reach.  Therapy Documentation Precautions:  Precautions Precautions: Fall Precaution Comments: Lt BKA with vac, PICC line, sensation deficits in B hands + Rt foot Required Braces or Orthoses: Other Brace Other Brace: L BKA shrinker and limb protector Restrictions Weight Bearing Restrictions: Yes LLE Weight Bearing: Touchdown weight bearing  Pain: 0/10  Therapy/Group: Individual Therapy  Perrin Maltese, PT, DPT 01/29/2021, 3:51 PM

## 2021-01-29 NOTE — Progress Notes (Signed)
Patient refused Bipap tonight  °

## 2021-01-30 LAB — GLUCOSE, CAPILLARY
Glucose-Capillary: 206 mg/dL — ABNORMAL HIGH (ref 70–99)
Glucose-Capillary: 111 mg/dL — ABNORMAL HIGH (ref 70–99)
Glucose-Capillary: 143 mg/dL — ABNORMAL HIGH (ref 70–99)
Glucose-Capillary: 180 mg/dL — ABNORMAL HIGH (ref 70–99)

## 2021-01-30 MED ORDER — INSULIN GLARGINE-YFGN 100 UNIT/ML ~~LOC~~ SOLN
13.0000 [IU] | Freq: Two times a day (BID) | SUBCUTANEOUS | Status: DC
  Administered 2021-01-30 – 2021-01-31 (×3): 13 [IU] via SUBCUTANEOUS
  Filled 2021-01-30 (×5): qty 0.13

## 2021-01-30 NOTE — Progress Notes (Addendum)
Occupational Therapy Session Note  Patient Details  Name: Richard [name] MRN: [id number] Date of Birth: [birth year]  Today's Date: 01/31/2021 OT Individual Time: 3570-1779 OT Individual Time Calculation (min): 57 min   Short Term Goals: Week 1:  OT Short Term Goal 1 (Week 1): Pt will complete toilet or BSC transfer using LRAD and Min A OT Short Term Goal 2 (Week 1): Pt will maintain dynamic standing balance during LB ADL with no more than Min A during 2 consecutive sessions OT Short Term Goal 3 (Week 1): Pt will state 2 strategies to impement at home during daily routine for diabetes management (i.e. diet, daily limb inspection, etc)  Skilled Therapeutic Interventions/Progress Updates:    Pt greeted in bed, stating he participated in bathing/dressing tasks with nursing already this AM. Agreeable to start session by brushing his teeth. Supine<sit completed unassisted while using the bedrail. Setup for oral care. At this time we discussed goals of today's session. Started with diabetic education, pt provided with printout regarding daily foot care. We openly discussed importance of foot hygiene during daily routine as well as wearing shoes in his house post d/c. Pt receptive to education, stated that he'd get cuts on his foot at home and was unable to feel them due to profound sensation deficits. Also wouldn't put on lotion which made his skin get dry/susceptible to breaking. Education provided on Designer, multimedia for daily limb inspection. He would greatly benefit from one of these mirrors when they are back in supply. Pt donned his shoe with Min A and then completed short distance ambulatory transfer to the elevated toilet using RW with Min A. 1 small LOB with pt able to self correct given Min A. After sitting for a few minutes on the toilet (simulated transfer so no void), pt ambulated again to the recliner with Min A, no LOBs this time. While seated, pt able to demonstrate 3 theraband exercises that  this therapist showed him during previous session. Encouraged independent exercise participation to really build up UB strength needed for ambulatory functional transfers. His home is only walker accessible. Pt remained sitting in the recliner at close of session, satting at 93-96% on RA. Note that pt holds his breath during transfers and he admits to this, OT strongly encouraged "1 hop 1 breath" technique to ensure that he keeps breathing. All needs within reach and safety belt fastened prior to departure. Tx focus placed on functional transfers, amputee/diabetic education, and dynamic balance.   Therapy Documentation Precautions:  Precautions Precautions: Fall Precaution Comments: Lt BKA with vac, PICC line, sensation deficits in B hands + Rt foot Required Braces or Orthoses: Other Brace Other Brace: L BKA shrinker and limb protector Restrictions Weight Bearing Restrictions: Yes LLE Weight Bearing: Non weight bearing  Pain: in residual limb, wanting pain medicine at end of session and pt used the call bell with cues to notify RN that he would like some pain medicine. Reviewed massage/tapping with pt and referred him to the handout I gave him this weekend regarding additional pain mgt techniques Pain Assessment Pain Scale: 0-10 Pain Score: 8  Pain Type: Surgical pain Pain Location: Leg Pain Orientation: Left Pain Descriptors / Indicators: Stabbing Pain Frequency: Constant Pain Onset: On-going Pain Intervention(s): Medication (See eMAR) ADL: ADL Eating: Not assessed Grooming: Setup Where Assessed-Grooming: Edge of bed Upper Body Bathing: Supervision/safety Where Assessed-Upper Body Bathing: Edge of bed Lower Body Bathing: Minimal assistance Where Assessed-Lower Body Bathing: Edge of bed Upper Body Dressing: Minimal assistance Where Assessed-Upper  Body Dressing: Edge of bed Lower Body Dressing: Moderate assistance Where Assessed-Lower Body Dressing: Edge of bed Toileting: Not  assessed Toilet Transfer: Not assessed Tub/Shower Transfer: Not assessed     Therapy/Group: Individual Therapy  Muzammil Bruins A Arnie Clingenpeel 01/31/2021, 12:10 PM

## 2021-01-30 NOTE — Progress Notes (Signed)
Wound care completed per order, well tolerated by pt

## 2021-01-30 NOTE — Progress Notes (Signed)
Pt has stated he does not want to wear cpap/bipap at this time.  Pt also stated as long as he's breathing good, he will not wear the device.  At this time, device has been removed from his room and since he has now refused for more than 3 nights, this RT will now d/c the order.  RT will continue to monitor as needed.

## 2021-01-30 NOTE — Progress Notes (Addendum)
PROGRESS NOTE   Subjective/Complaints: Pt states that pain is controlled. Denies any new problems overnight. Asked how long it would take for left leg to "heal".   ROS: Patient denies fever, rash, sore throat, blurred vision, nausea, vomiting, diarrhea, cough, shortness of breath or chest pain,  headache, or mood change.   Objective:   No results found. Recent Labs    01/28/21 0411  WBC 12.0*  HGB 13.4  HCT 41.2  PLT 352   Recent Labs    01/28/21 0411  NA 132*  K 4.5  CL 91*  CO2 33*  GLUCOSE 86  BUN 39*  CREATININE 1.22  CALCIUM 8.6*    Intake/Output Summary (Last 24 hours) at 01/30/2021 0844 Last data filed at 01/30/2021 0745 Gross per 24 hour  Intake 1080 ml  Output 2575 ml  Net -1495 ml        Physical Exam: Vital Signs Blood pressure 94/75, pulse 68, temperature (!) 97.5 F (36.4 C), temperature source Oral, resp. rate 20, height 6\' 5"  (1.956 m), weight 112.2 kg, SpO2 97 %. Constitutional: No distress . Vital signs reviewed. HEENT: NCAT, EOMI, oral membranes moist Neck: supple Cardiovascular: RRR without murmur. No JVD    Respiratory/Chest: CTA Bilaterally without wheezes or rales. Normal effort    GI/Abdomen: BS +, non-tender, non-distended Ext: no clubbing, cyanosis, or edema Psych: pleasant and cooperative  Musculoskeletal:        General: Swelling and tenderness present.    Cervical back: Normal range of motion and neck supple. Skin:    Comments: Left BKA with vac dressing. S/s drainage in vac 200cc -right foot with large ulcerated/separated superfiical layer of epithelium with underlying dermis. No odor. No obvious tunneling.   Neurological:    Cranial Nerves: Cranial nerve deficit present.    Comments: alert and appropriate. UE 4-5/5. RLE 3-4/5 , LLE limited by pain. No focal sensory findings. Cerebellar normal. No abnormal tone.       Assessment/Plan: 1. Functional deficits which  require 3+ hours per day of interdisciplinary therapy in a comprehensive inpatient rehab setting. Physiatrist is providing close team supervision and 24 hour management of active medical problems listed below. Physiatrist and rehab team continue to assess barriers to discharge/monitor patient progress toward functional and medical goals  Care Tool:  Bathing    Body parts bathed by patient: Right arm, Left arm, Chest, Abdomen, Front perineal area, Buttocks, Right upper leg, Face   Body parts bathed by helper: Right lower leg Body parts n/a: Left upper leg, Left lower leg   Bathing assist Assist Level: Minimal Assistance - Patient > 75%     Upper Body Dressing/Undressing Upper body dressing   What is the patient wearing?: Pull over shirt    Upper body assist Assist Level: Minimal Assistance - Patient > 75%    Lower Body Dressing/Undressing Lower body dressing      What is the patient wearing?: Pants     Lower body assist Assist for lower body dressing: Moderate Assistance - Patient 50 - 74%     Toileting Toileting Toileting Activity did not occur (Clothing management and hygiene only): N/A (no void or bm)  Toileting assist  Assist for toileting: Set up assist Assistive Device Comment: urinal   Transfers Chair/bed transfer  Transfers assist  Chair/bed transfer activity did not occur: Safety/medical concerns (pt became orthostatic)  Chair/bed transfer assist level: Moderate Assistance - Patient 50 - 74%     Locomotion Ambulation   Ambulation assist   Ambulation activity did not occur: Safety/medical concerns          Walk 10 feet activity   Assist  Walk 10 feet activity did not occur: Safety/medical concerns        Walk 50 feet activity   Assist Walk 50 feet with 2 turns activity did not occur: Safety/medical concerns         Walk 150 feet activity   Assist Walk 150 feet activity did not occur: Safety/medical concerns         Walk 10  feet on uneven surface  activity   Assist Walk 10 feet on uneven surfaces activity did not occur: Safety/medical concerns         Wheelchair     Assist Will patient use wheelchair at discharge?: Yes Type of Wheelchair: Manual Wheelchair activity did not occur: Safety/medical concerns         Wheelchair 50 feet with 2 turns activity    Assist    Wheelchair 50 feet with 2 turns activity did not occur: Safety/medical concerns       Wheelchair 150 feet activity     Assist  Wheelchair 150 feet activity did not occur: Safety/medical concerns       Blood pressure 94/75, pulse 68, temperature (!) 97.5 F (36.4 C), temperature source Oral, resp. rate 20, height 6\' 5"  (1.956 m), weight 112.2 kg, SpO2 97 %.    Medical Problem List and Plan: 1.  Functional and mobility deficits secondary to left BKA and multiple medical             -patient may not yet shower             -ELOS/Goals: 14-16 days, supervision goals  -Continue CIR therapies including PT, OT  -discussed healing/prosthetic process with patient 2.  Antithrombotics: -DVT/anticoagulation:  Pharmaceutical: Lovenox             -antiplatelet therapy: Continue ASA 3. Pain Management: pt with incisional and phantom pain             -tylenol for mild pain, hydrocodone for moderate to severe pain             -robaxin for spasms             -add gabapentin for phantom limb pain 4. Mood: LCSW to follow for evaluation and support.              -antipsychotic agents: N/A 5. Neuropsych: This patient is capable of making decisions on his own behalf. 6. Skin/Wound Care:   -Vac to left stump since 7/29---remove today 8/7   -dry dressing and ACE/shrinker  -aquacell/xeroform to right foot. Keep area clean.  -Requested ortho  10/7) follow up--reach out again monday  -will allow to pivot only on RLE for transfers 7. Fluids/Electrolytes/Nutrition: Strict I/O. Continue Zinc and Juven to promote wound healing 8.  Leucocytosis/Bacteremia: WBC trending down.  Continue antibiotics for 2 more days.             --  wbc's decreased  to 12k 9.  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             --  Encourage pulmonary hygiene.  -continue oxygen to keep sats between 88 and 92%.  10.  Chronic combined CHF: Check daily weights.  Monitor for signs of overload.             -- On Zebeta, hydralazine, Entresto, Jardiance, Aldactone, Imdur, Lipitor and ASA 11.  T2DM: Monitor blood sugars ac/hs and2 use SSI for elevated BS             --8/7 CBGs remain elevated -increase  Lantus to 13u bid -Jardiance and 4 units novolog for meal coverage  CBG (last 3)  Recent Labs    01/29/21 1648 01/29/21 2016 01/30/21 0507  GLUCAP 186* 242* 206*    12. Anxiety d/o: Team to provide ego support.             --on buspar and cymbalta. 13. Constipation: Has been on IV reglan and Senna bid 14. Ongoing hiccups: Continue baclofen tid prn--may need it scheduled  15. Orthostatic hypotension: orthostatic vitals ordered daily at 0730. Discontinue hydralazing 10mg  q8H.   LOS: 3 days A FACE TO FACE EVALUATION WAS PERFORMED  01/30/2021, 8:44 AM

## 2021-01-30 NOTE — IPOC Note (Signed)
Overall Plan of Care [facility]) Patient Details Name: Richard [name] MRN: [id number] DOB: 04/16/1961  Admitting Diagnosis: Below-knee amputation of left lower extremity [facility])  Hospital Problems: Principal Problem:   Below-knee amputation of left lower extremity (HCC)     Functional Problem List: Nursing    PT Balance, Edema, Endurance, Pain, Sensory, Skin Integrity  OT Cognition, Endurance, Motor, Pain, Safety, Sensory, Skin Integrity  SLP    TR         Basic ADL's: OT Grooming, Bathing, Dressing, Toileting     Advanced  ADL's: OT Simple Meal Preparation     Transfers: PT Bed Mobility, Bed to Chair, Car, Occupational psychologist, Research scientist (life sciences): PT Ambulation, Psychologist, prison and probation services, Stairs     Additional Impairments: OT Fuctional Use of Upper Extremity (pt reports being unable to handle money due to sensation deficits in hands, also unable to safely cook, unable to pull shirt down over trunk at this time without assistance)  SLP        TR      Anticipated Outcomes Item Anticipated Outcome  Self Feeding No goal  Swallowing      Basic self-care  Supervision  Toileting  Supervision   Bathroom Transfers Supervision  Bowel/Bladder     Transfers  supervision  Locomotion  supervision short distance gait, mod I w/c  Communication     Cognition     Pain     Safety/Judgment      Therapy Plan: PT Intensity: Minimum of 1-2 x/day ,45 to 90 minutes PT Frequency: 5 out of 7 days PT Duration Estimated Length of Stay: 15-18 days OT Intensity: Minimum of 1-2 x/day, 45 to 90 minutes OT Frequency: 5 out of 7 days OT Duration/Estimated Length of Stay: 14-16 days     Due to the current state of emergency, patients may not be receiving their 3-hours of Medicare-mandated therapy.   Team Interventions: Nursing Interventions    PT interventions Ambulation/gait training, Cognitive remediation/compensation, Community reintegration, Discharge planning, DME/adaptive  equipment instruction, Functional mobility training, Neuromuscular re-education, Pain management, Patient/family education, Psychosocial support, Skin care/wound management, Stair training, Therapeutic Activities, Therapeutic Exercise, UE/LE Strength taining/ROM, UE/LE Coordination activities, Wheelchair propulsion/positioning  OT Interventions Warden/ranger, DME/adaptive equipment instruction, Patient/family education, Therapeutic Activities, Wheelchair propulsion/positioning, Therapeutic Exercise, Psychosocial support, Cognitive remediation/compensation, Community reintegration, Functional mobility training, Self Care/advanced ADL retraining, UE/LE Strength taining/ROM, Discharge planning, Skin care/wound managment, UE/LE Coordination activities, Disease mangement/prevention, Pain management  SLP Interventions    TR Interventions    SW/CM Interventions Discharge Planning, Psychosocial Support, Patient/Family Education   Barriers to Discharge MD  Medical stability and Wound care  Nursing      PT Inaccessible home environment, Decreased caregiver support, Home environment access/layout, Wound Care, Lack of/limited family support, Weight, Weight bearing restrictions    OT Wound Care, Weight bearing restrictions    SLP      SW       Team Discharge Planning: Destination: PT-Home ,OT- Home , SLP-  Projected Follow-up: PT-Home health PT, OT-  Home health OT, SLP-  Projected Equipment Needs: PT-Wheelchair (measurements), Rolling walker with 5" wheels, OT- To be determined, SLP-  Equipment Details: PT- , OT-  Patient/family involved in discharge planning: PT- Patient,  OT-Patient, SLP-   MD ELOS: 14-16 days Medical Rehab Prognosis:  Excellent Assessment: The patient has been admitted for CIR therapies with the diagnosis of left BKA. The team will be addressing functional mobility, strength, stamina, balance, safety, adaptive techniques and equipment, self-care,  bowel and bladder  mgt, patient and caregiver education, pain mgt, wound care. Goals have been set at supervision with mobility and self-care. Right foot wounds may impact LOS and goals.   Due to the current state of emergency, patients may not be receiving their 3 hours per day of Medicare-mandated therapy.    Ranelle Oyster, MD, FAAPMR     See Team Conference Notes for weekly updates to the plan of care

## 2021-01-31 DIAGNOSIS — I70234 Atherosclerosis of native arteries of right leg with ulceration of heel and midfoot: Secondary | ICD-10-CM

## 2021-01-31 DIAGNOSIS — J449 Chronic obstructive pulmonary disease, unspecified: Secondary | ICD-10-CM

## 2021-01-31 DIAGNOSIS — I509 Heart failure, unspecified: Secondary | ICD-10-CM

## 2021-01-31 LAB — BASIC METABOLIC PANEL
Anion gap: 7 (ref 5–15)
BUN: 43 mg/dL — ABNORMAL HIGH (ref 6–20)
CO2: 29 mmol/L (ref 22–32)
Calcium: 8.6 mg/dL — ABNORMAL LOW (ref 8.9–10.3)
Chloride: 95 mmol/L — ABNORMAL LOW (ref 98–111)
Creatinine, Ser: 1.3 mg/dL — ABNORMAL HIGH (ref 0.61–1.24)
GFR, Estimated: >60 mL/min (ref >60)
Glucose, Bld: 149 mg/dL — ABNORMAL HIGH (ref 70–99)
Potassium: 3.8 mmol/L (ref 3.5–5.1)
Sodium: 131 mmol/L — ABNORMAL LOW (ref 135–145)

## 2021-01-31 LAB — GLUCOSE, CAPILLARY
Glucose-Capillary: 132 mg/dL — ABNORMAL HIGH (ref 70–99)
Glucose-Capillary: 132 mg/dL — ABNORMAL HIGH (ref 70–99)
Glucose-Capillary: 184 mg/dL — ABNORMAL HIGH (ref 70–99)
Glucose-Capillary: 146 mg/dL — ABNORMAL HIGH (ref 70–99)

## 2021-01-31 LAB — CBC
HCT: 39.6 % (ref 39.0–52.0)
Hemoglobin: 12.4 g/dL — ABNORMAL LOW (ref 13.0–17.0)
MCH: 27.9 pg (ref 26.0–34.0)
MCHC: 31.3 g/dL (ref 30.0–36.0)
MCV: 89.2 fL (ref 80.0–100.0)
Platelets: 308 10*3/uL (ref 150–400)
RBC: 4.44 MIL/uL (ref 4.22–5.81)
RDW: 18.1 % — ABNORMAL HIGH (ref 11.5–15.5)
WBC: 10.1 10*3/uL (ref 4.0–10.5)
nRBC: 0 % (ref 0.0–0.2)

## 2021-01-31 NOTE — Progress Notes (Signed)
PROGRESS NOTE   Subjective/Complaints:  Still has VAC- thought needed for 1 month- explained it's scheduled for removal today.  VAC is 1/2 full.  Pain "tolerable". Mainly in L calf.  LBM yesterday.  Refusing CPAP x3 nights- was removed from room by resp therapy.    ROS:  Pt denies SOB, abd pain, CP, N/V/C/D, and vision changes   Objective:   No results found. Recent Labs    01/31/21 0316  WBC 10.1  HGB 12.4*  HCT 39.6  PLT 308   Recent Labs    01/31/21 0316  NA 131*  K 3.8  CL 95*  CO2 29  GLUCOSE 149*  BUN 43*  CREATININE 1.30*  CALCIUM 8.6*    Intake/Output Summary (Last 24 hours) at 01/31/2021 0856 Last data filed at 01/31/2021 0742 Gross per 24 hour  Intake 1200 ml  Output 1550 ml  Net -350 ml        Physical Exam: Vital Signs Blood pressure 99/73, pulse 72, temperature 98.6 F (37 C), temperature source Oral, resp. rate 18, height 6\' 5"  (1.956 m), weight 112.2 kg, SpO2 98 %.  BP soft.  General: awake, alert, appropriate, laying over bed- like was sitting on EOB, but laid down; NAD HENT: conjugate gaze; oropharynx moist CV: regular rate; no JVD Pulmonary: CTA B/L; no W/R/R- good air movement GI: soft, NT, ND, (+)BS; normoactive Psychiatric: appropriate; quiet Neurological: alert   Musculoskeletal:        General: Swelling and tenderness present.    Cervical back: Normal range of motion and neck supple. Skin:    Comments: Left BKA with vac dressing. S/s drainage in vac 200cc- has 250cc in VAC_ to be removed today.  -right foot with large ulcerated/separated superfiical layer of epithelium with underlying dermis. No odor. No obvious tunneling.   Neurological:    Cranial Nerves: Cranial nerve deficit present.    Comments: alert and appropriate. UE 4-5/5. RLE 3-4/5 , LLE limited by pain. No focal sensory findings. Cerebellar normal. No abnormal tone.       Assessment/Plan: 1. Functional  deficits which require 3+ hours per day of interdisciplinary therapy in a comprehensive inpatient rehab setting. Physiatrist is providing close team supervision and 24 hour management of active medical problems listed below. Physiatrist and rehab team continue to assess barriers to discharge/monitor patient progress toward functional and medical goals  Care Tool:  Bathing    Body parts bathed by patient: Right arm, Left arm, Chest, Abdomen, Front perineal area, Buttocks, Right upper leg, Face   Body parts bathed by helper: Right lower leg Body parts n/a: Left upper leg, Left lower leg   Bathing assist Assist Level: Minimal Assistance - Patient > 75%     Upper Body Dressing/Undressing Upper body dressing   What is the patient wearing?: Pull over shirt    Upper body assist Assist Level: Minimal Assistance - Patient > 75%    Lower Body Dressing/Undressing Lower body dressing      What is the patient wearing?: Pants     Lower body assist Assist for lower body dressing: Dependent - Patient 0%     Toileting Toileting Toileting Activity did not occur (  Clothing management and hygiene only): N/A (no void or bm)  Toileting assist Assist for toileting: 2 Helpers Assistive Device Comment: urinal(set up assist) and BSC(two helpers)   Transfers Chair/bed transfer  Transfers assist  Chair/bed transfer activity did not occur: Safety/medical concerns (pt became orthostatic)  Chair/bed transfer assist level: 2 Helpers     Locomotion Ambulation   Ambulation assist   Ambulation activity did not occur: Safety/medical concerns          Walk 10 feet activity   Assist  Walk 10 feet activity did not occur: Safety/medical concerns        Walk 50 feet activity   Assist Walk 50 feet with 2 turns activity did not occur: Safety/medical concerns         Walk 150 feet activity   Assist Walk 150 feet activity did not occur: Safety/medical concerns         Walk 10  feet on uneven surface  activity   Assist Walk 10 feet on uneven surfaces activity did not occur: Safety/medical concerns         Wheelchair     Assist Will patient use wheelchair at discharge?: Yes Type of Wheelchair: Manual Wheelchair activity did not occur: Safety/medical concerns         Wheelchair 50 feet with 2 turns activity    Assist    Wheelchair 50 feet with 2 turns activity did not occur: Safety/medical concerns       Wheelchair 150 feet activity     Assist  Wheelchair 150 feet activity did not occur: Safety/medical concerns       Blood pressure 99/73, pulse 72, temperature 98.6 F (37 C), temperature source Oral, resp. rate 18, height 6\' 5"  (1.956 m), weight 112.2 kg, SpO2 98 %.    Medical Problem List and Plan: 1.  Functional and mobility deficits secondary to left BKA and multiple medical             -patient may not yet shower             -ELOS/Goals: 14-16 days, supervision goals  -Continue CIR therapies including PT, OT  -discussed healing/prosthetic process with patient  Con't CIR/PT and OT- can NOW shower- will remove VAC this AM. Shower after that. Cover wounds.   2.  Antithrombotics: -DVT/anticoagulation:  Pharmaceutical: Lovenox             -antiplatelet therapy: Continue ASA 3. Pain Management: pt with incisional and phantom pain             -tylenol for mild pain, hydrocodone for moderate to severe pain             -robaxin for spasms             -add gabapentin for phantom limb pain  8/8- said can tolerate pain- needs meds- con't regimen 4. Mood: LCSW to follow for evaluation and support.              -antipsychotic agents: N/A 5. Neuropsych: This patient is capable of making decisions on his own behalf. 6. Skin/Wound Care:   -Vac to left stump since 7/29---remove today 8/7   -dry dressing and ACE/shrinker  -aquacell/xeroform to right foot. Keep area clean.  -Requested ortho  10/7) follow up--reach out again  monday  -will allow to pivot only on RLE for transfers  8/8- will call Dr 10/8 to look at R foot- d/w PA; and d/c VAC on LLE.  7. Fluids/Electrolytes/Nutrition: Strict I/O. Continue  Zinc and Juven to promote wound healing 8. Leucocytosis/Bacteremia: WBC trending down.  Continue antibiotics for 2 more days.             --  wbc's decreased  to 12k 9.  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             -- Encourage pulmonary hygiene.  -continue oxygen to keep sats between 88 and 92%.  10.  Chronic combined CHF: Check daily weights.  Monitor for signs of overload.             -- On Zebeta, hydralazine, Entresto, Jardiance, Aldactone, Imdur, Lipitor and ASA 11.  T2DM: Monitor blood sugars ac/hs and2 use SSI for elevated BS             --8/7 CBGs remain elevated -increase  Lantus to 13u bid -Jardiance and 4 units novolog for meal coverage  CBG (last 3)  Recent Labs    01/30/21 1623 01/30/21 2120 01/31/21 0514  GLUCAP 143* 180* 132*    8/8- BG's controlled overall- con't regimen for now. Will see if was on insulin prior to admission.  12. Anxiety d/o: Team to provide ego support.             --on buspar and cymbalta. 13. Constipation: Has been on IV reglan and Senna bid 14. Ongoing hiccups: Continue baclofen tid prn--may need it scheduled  15. Orthostatic hypotension: orthostatic vitals ordered daily at 0730. Discontinue hydralazing 10mg  q8H.   8/8- BP running high 90s/50s-70s- don't have Orthostatics for this AM- will monitor  LOS: 4 days A FACE TO FACE EVALUATION WAS PERFORMED  Richard [name] 01/31/2021, 8:56 AM

## 2021-01-31 NOTE — Telephone Encounter (Signed)
I left a message for Pam.  He did have an abnormal ABIs on the right foot.  I left her a message with regarding to what his pulses are like and that I would recommend getting a vascular consult.  I will be happy to see him tomorrow

## 2021-01-31 NOTE — Discharge Instructions (Signed)
Inpatient Rehab Discharge Instructions  Richard [name] Discharge date and time:    Activities/Precautions/ Functional Status: Activity: no lifting, driving, or strenuous exercise till cleared by MD Diet: cardiac diet and diabetic diet Wound Care:    Functional status:  ___ No restrictions     ___ Walk up steps independently ___ 24/7 supervision/assistance   ___ Walk up steps with assistance ___ Intermittent supervision/assistance  ___ Bathe/dress independently ___ Walk with walker     ___ Bathe/dress with assistance ___ Walk Independently    ___ Shower independently ___ Walk with assistance    ___ Shower with assistance ___ No alcohol     ___ Return to work/school ________  Special Instructions:    My questions have been answered and I understand these instructions. I will adhere to these goals and the provided educational materials after my discharge from the hospital.  Patient/Caregiver Signature _______________________________ Date __________  Clinician Signature _______________________________________ Date __________  Please bring this form and your medication list with you to all your follow-up doctor's appointments.

## 2021-01-31 NOTE — Progress Notes (Addendum)
Order to remove wound vac, evaluated and there looks to be a moderate amount of drainage, however unable to locate when last marked for accurate drainage volume. Discussed with P.Love and will leave in place.  Currently volume is just under 

## 2021-01-31 NOTE — Progress Notes (Signed)
Held Lovenox this afternoon due to procedure tomorrow. Informed consent is in chart, needs pt signature.

## 2021-01-31 NOTE — Telephone Encounter (Signed)
Please see below.

## 2021-01-31 NOTE — Progress Notes (Signed)
Physical Therapy Session Note  Patient Details  Name: Richard [name] MRN: [id number] Date of Birth: 06/02/1961  Today's Date: 01/31/2021 PT Individual Time: 1030-1130 PT Individual Time Calculation (min): 60 min   Short Term Goals: Week 1:  PT Short Term Goal 1 (Week 1): Pt will initiate gait training with LRAD PT Short Term Goal 2 (Week 1): Pt will tolerate >15 minutes of axtivity without change in vital signs PT Short Term Goal 3 (Week 1): Pt will propel w/c x 150 ft  Skilled Therapeutic Interventions/Progress Updates:    Pt received in recliner and agreeable to therapy.  No complaint of pain. Pt performed Stand pivot transfer transfer with CGA throughout session with RW. Pt reported nausea throughout session, somewhat better while laying prone. BP in sitting = 115/77, in standing= 116/80. Pt propelled w/c with BUE 2 x 100 ft, limited by nausea.  Pt returned to room and performed bed mobility with supervision, rolling to prone to stretch hip flexors. Pt reported improved stretch with pillow under LLE. Pt requested to be left in prone, was left with bed alarm active and all needs in reach.   Therapy Documentation Precautions:  Precautions Precautions: Fall Precaution Comments: Lt BKA with vac, PICC line, sensation deficits in B hands + Rt foot Required Braces or Orthoses: Other Brace Other Brace: L BKA shrinker and limb protector Restrictions Weight Bearing Restrictions: Yes LLE Weight Bearing: Non weight bearing    Therapy/Group: Individual Therapy  [name] 01/31/2021, 4:21 PM

## 2021-01-31 NOTE — H&P (View-Only) (Signed)
Hospital Consult    Reason for Consult:  R foot blister Requesting Physician:  Dr. Lovorn MRN #:  [id number]  History of Present Illness: This is a 59 y.o. male recently admitted to inpatient rehabilitation status post left below the knee amputation by Dr. Duda.  Vascular surgery has been consulted to evaluate circulation of right lower extremity given wound of right foot.  Patient states circulation was just checked 2 weeks ago and he was told it was "good."  He is unsure who evaluated his circulation of his right lower extremity however states in the past circulation of left lower extremity has been evaluated in [place] where he lives.  He denies any history of claudication of right lower extremity.  He has been receiving wound care by Dr. Dilley at the wound clinic in [place].  He is not interested in any intervention including angiography of right lower extremity at this time.  He is unsure of his anticipated discharge date from CIR  Past Medical History:  Diagnosis Date   AICD (automatic cardioverter/defibrillator) present 01/11/2017   Anxiety    CHF (congestive heart failure) (HCC)    CKD (chronic kidney disease)    COPD (chronic obstructive pulmonary disease) (HCC)    never smoker, industrial exposure   Diabetes mellitus with diabetic neuropathy, with long-term current use of insulin (HCC)    GERD (gastroesophageal reflux disease)    High cholesterol    History of gout    "take RX qd" (11/01/2017)   Hypertension    Nonobstructive atherosclerosis of coronary artery    On home oxygen therapy    "2L prn" (11/01/2017)   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HCC)    LLL, present for years   Systolic HF (heart failure) (HCC)     Past Surgical History:  Procedure Laterality Date   AMPUTATION Left 01/21/2021   Procedure: AMPUTATION BELOW KNEE;  Surgeon: Duda, Marcus V, MD;  Location: [facility];  Service: Orthopedics;  Laterality: Left;   APPLICATION OF WOUND VAC Left 01/21/2021   Procedure:  APPLICATION OF WOUND VAC;  Surgeon: Duda, Marcus V, MD;  Location: [facility] OR;  Service: Orthopedics;  Laterality: Left;   CATARACT EXTRACTION W/ INTRAOCULAR LENS IMPLANTW/ TRABECULECTOMY Left ~ 2013   "may have done this when I had my other eye OR"   CIRCUMCISION     CORONARY STENT INTERVENTION N/A 11/02/2017   Procedure: CORONARY STENT INTERVENTION;  Surgeon: McLean, Dalton S, MD;  [facility]: [facility];  Service: Cardiovascular;  Laterality: N/A;   CORONARY STENT INTERVENTION N/A 11/02/2017   Procedure: CORONARY STENT INTERVENTION;  Surgeon: Smith, Henry W, MD;  [facility]: [facility];  Service: Cardiovascular;  Laterality: N/A;   EYE SURGERY Left ~ 2013   "fell on something in basement; stuck in my eye"   ICD IMPLANT     Medtronic Dual Chamber 01/11/17   MULTIPLE TOOTH EXTRACTIONS     "pulled all my top teeth"   RIGHT/LEFT HEART CATH AND CORONARY ANGIOGRAPHY N/A 11/02/2017   Procedure: RIGHT/LEFT HEART CATH AND CORONARY ANGIOGRAPHY;  Surgeon: McLean, Dalton S, MD;  [facility];  Service: Cardiovascular;  Laterality: N/A;   TEE WITHOUT CARDIOVERSION N/A 01/20/2021   Procedure: TRANSESOPHAGEAL ECHOCARDIOGRAM (TEE);  Surgeon: McLean, Dalton S, MD;  Location: [facility] ENDOS[facility];  Service: Cardiovascular;  Laterality: N/A;    Allergies  Allergen Reactions   Aspirin Itching   Codeine Hives   Coconut Oil Itching    Prior to Admission   medications   Medication Sig Start Date End Date Taking? Authorizing Provider  acetaminophen (TYLENOL) 650 MG CR tablet Take 650 mg by mouth every 8 (eight) hours as needed for pain.    [provider]  albuterol (VENTOLIN HFA) 108 (90 Base) MCG/ACT inhaler Inhale 1-2 puffs into the lungs every 4 (four) hours as needed for wheezing or shortness of breath. 10/17/20   Alessandra Bevels, MD  allopurinol (ZYLOPRIM) 100 MG tablet Take 1 tablet (100 mg total) by mouth daily. 12/26/17   Clegg, Amy D, NP  aspirin 81 MG chewable tablet Chew 81 mg  by mouth daily.    [provider]  atorvastatin (LIPITOR) 40 MG tablet TAKE ONE TABLET BY MOUTH DAILY AT [facility] 11/15/20   Laurey Morale, MD  bisoprolol (ZEBETA) 5 MG tablet Take 2.5 mg by mouth daily.    [provider]  BREO ELLIPTA 100-25 MCG/INH AEPB Inhale 1 puff into the lungs daily. 01/03/21   [provider]  busPIRone (BUSPAR) 5 MG tablet Take 5 mg by mouth 2 (two) times daily.    [provider]  docusate sodium (COLACE) 100 MG capsule Take 100 mg by mouth daily as needed for mild constipation.    [provider]  DULoxetine (CYMBALTA) 60 MG capsule Take 60 mg by mouth daily. 09/28/17   [provider]  fenofibrate (TRICOR) 145 MG tablet TAKE ONE TABLET BY MOUTH DAILY 12/15/20   Laurey Morale, MD  hydrALAZINE (APRESOLINE) 50 MG tablet Take 1.5 tablets (75 mg total) by mouth 3 (three) times daily. 09/09/20   Laurey Morale, MD  HYDROcodone-acetaminophen (NORCO/VICODIN) 5-325 MG tablet Take 1-2 tablets by mouth every 6 (six) hours as needed for moderate pain (pain score 4-6). 01/26/21   Lurene Shadow, MD  Insulin Pen Needle (NOVOFINE) 30G X 8 MM MISC Inject 10 each into the skin as needed. 11/06/17   Arnetha Courser, MD  insulin regular human CONCENTRATED (HUMULIN R) 500 UNIT/ML kwikpen Inject 0-400 Units into the skin as directed. Sliding 07/18/17   [provider]  isosorbide mononitrate (IMDUR) 30 MG 24 hr tablet Take 1 tablet (30 mg total) by mouth daily. 01/27/21   Lurene Shadow, MD  JARDIANCE 25 MG TABS tablet Take 25 mg by mouth daily. 10/15/19   Laurey Morale, MD  MAGNESIUM-OXIDE 400 (241.3 Mg) MG tablet Take 1 tablet (400 mg total) by mouth daily. 08/27/20   Laurey Morale, MD  Multiple Vitamin (MULTIVITAMIN WITH MINERALS) TABS tablet Take 1 tablet by mouth daily. 01/27/21   Lurene Shadow, MD  nutrition supplement, JUVEN, (JUVEN) PACK Take 1 packet by mouth 2 (two) times daily between meals. 01/26/21   Lurene Shadow, MD   polyethylene glycol (MIRALAX / GLYCOLAX) 17 g packet Take 17 g by mouth daily as needed for mild constipation. 01/26/21   Lurene Shadow, MD  pregabalin (LYRICA) 150 MG capsule Take 150 mg by mouth 3 (three) times daily. 10/07/20   [provider]  sacubitril-valsartan (ENTRESTO) 24-26 MG Take 1 tablet by mouth 2 (two) times daily. 01/26/21   Lurene Shadow, MD  spironolactone (ALDACTONE) 25 MG tablet Take 0.5 tablets (12.5 mg total) by mouth daily. 01/27/21   Lurene Shadow, MD  SSD 1 % cream Apply 1 application topically daily as needed for wound care. 12/20/20   [provider]  Torsemide 40 MG TABS Take 40 mg by mouth daily. 01/26/21   Lurene Shadow, MD  zinc sulfate 220 (50 Zn) MG capsule Take  1 capsule (220 mg total) by mouth daily. 01/27/21   Lurene Shadow, MD  gabapentin (NEURONTIN) 100 MG capsule Take 1 capsule (100 mg total) by mouth 3 (three) times daily. 01/26/21 01/26/21  Lurene Shadow, MD    Social History   Socioeconomic History   Marital status: Divorced    Spouse name: Not on file   Number of children: Not on file   Years of education: Not on file   Highest education level: Not on file  Occupational History   Occupation: disability    Comment: stopped working in 2000 d/t occupational exposures  Tobacco Use   Smoking status: Former    Types: Cigarettes   Smokeless tobacco: Never   Tobacco comments:    "smoked when I drank"  Vaping Use   Vaping Use: Never used  Substance and Sexual Activity   Alcohol use: Not Currently    Comment: 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  Other Topics Concern   Not on file  Social History Narrative   Patient lives at home with wife and some of his 10 children. He self-administers his own medications.   Social Determinants of Health   Financial Resource Strain: Low Risk    Difficulty of Paying Living Expenses: Not very hard  Food Insecurity: No Food  Insecurity   Worried About Programme researcher, broadcasting/film/video in the Last Year: Never true   Ran Out of Food in the Last Year: Never true  Transportation Needs: No Transportation Needs   Lack of Transportation (Medical): No   Lack of Transportation (Non-Medical): No  Physical Activity: Not on file  Stress: Not on file  Social Connections: Not on file  Intimate Partner Violence: Not on file     Family History  Problem Relation Age of Onset   Hypertension Mother    Diabetes Mother    Hypertension Father    Diabetes Father    Diabetes Sister    Diabetes Brother     ROS: Otherwise negative unless mentioned in HPI  Physical Examination  Vitals:   01/30/21 1956 01/31/21 0514  BP: (!) 94/51 99/73  Pulse: 79 72  Resp: 18   Temp: 98.6 F (37 C)   SpO2: 93% 98%   Body mass index is 29.33 kg/m.  General:  WDWN in NAD Gait: Not observed HENT: WNL, normocephalic Pulmonary: normal non-labored breathing, without Rales, rhonchi,  wheezing Cardiac: regular Abdomen:  soft, NT/ND, no masses Skin: without rashes Vascular Exam/Pulses: 1+ R PT pulse Extremities: large area of blistering of base of GT and plantar foot Musculoskeletal: no muscle wasting or atrophy  Neurologic: A&O X 3;  No focal weakness or paresthesias are detected; speech is fluent/normal Psychiatric:  The pt has Normal affect. Lymph:  Unremarkable  CBC    Component Value Date/Time   WBC 10.1 01/31/2021 0316   RBC 4.44 01/31/2021 0316   HGB 12.4 (L) 01/31/2021 0316   HCT 39.6 01/31/2021 0316   PLT 308 01/31/2021 0316   MCV 89.2 01/31/2021 0316   MCH 27.9 01/31/2021 0316   MCHC 31.3 01/31/2021 0316   RDW 18.1 (H) 01/31/2021 0316   LYMPHSABS 1.8 01/28/2021 0411   MONOABS 1.3 (H) 01/28/2021 0411   EOSABS 0.1 01/28/2021 0411   BASOSABS 0.0 01/28/2021 0411    BMET    Component Value Date/Time   NA 131 (L) 01/31/2021 0316   K 3.8 01/31/2021 0316  CL 95 (L) 01/31/2021 0316   CO2 29 01/31/2021 0316   GLUCOSE 149 (H)  01/31/2021 0316   BUN 43 (H) 01/31/2021 0316   CREATININE 1.30 (H) 01/31/2021 0316   CALCIUM 8.6 (L) 01/31/2021 0316   GFRNONAA >60 01/31/2021 0316   GFRAA >60 01/02/2020 1516    COAGS: Lab Results  Component Value Date   INR 1.12 11/01/2017     Non-Invasive Vascular Imaging:   ABI/TBI demonstrates 1.06 and 0.41 as of 01/24/2021    ASSESSMENT/PLAN: This is a 60 y.o. male with PAD and wound of right foot  -Patient is status post below the knee amputation by Dr. Lajoyce Corners of left lower extremity.  He also has a wound of his right foot.  As of 01/24/2021 right ABI is 1.06 and TBI of 0.41.  Patient states he is receiving wound care in [facility] and was told his circulation is "good."  I may be able to feel a PT pulse however given the extent of tissue loss and TBI of 0.4 recommendation would include angiography to better evaluate.  After explaining the procedure the patient is not willing to undergo any further surgery at this time.  He however will be happy to establish care with [place] here in [place] after discharge.  On-call vascular surgeon Dr. Chestine Spore will evaluate the patient later today and provide further treatment plans   Emilie Rutter PA-C [facility] [phone]   I have seen and evaluated the patient. I agree with the PA note as documented above.  60 year old male admitted to rehab after recent left below-knee amputation by Dr. Lajoyce Corners.  Vascular surgery was consulted for a blister on the right foot.  Patient states this started after his shoe rubbed a wound on the foot.  He actually has fairly extensive tissue loss as pictured.  I reviewed his recent ABIs that are 1.06 and likely inaccurate given the waveforms are very blunted and monophasic.  He has a severely reduced toe pressure 41.  He does have femoral pulses that are palpable bilaterally.  Discussed that I think he would benefit from aortogram with right lower extremity arteriogram tomorrow in the Cath Lab.  This  will be with my partner Dr. Myra Gianotti.  I discussed my concern that without intervention he will be at high risk for losing his other leg given the extent of his tissue loss.  Keep n.p.o. after midnight.   Cephus Shelling, MD [facility] [place] Office: [phone]

## 2021-01-31 NOTE — Consult Note (Addendum)
Hospital Consult    Reason for Consult:  R foot blister Requesting Physician:  Dr. Berline Chough MRN #:  [id number]  History of Present Illness: This is a 60 y.o. male recently admitted to inpatient rehabilitation status post left below the knee amputation by Dr. Lajoyce Corners.  Vascular surgery has been consulted to evaluate circulation of right lower extremity given wound of right foot.  Patient states circulation was just checked 2 weeks ago and he was told it was "good."  He is unsure who evaluated his circulation of his right lower extremity however states in the past circulation of left lower extremity has been evaluated in [facility] where he lives.  He denies any history of claudication of right lower extremity.  He has been receiving wound care by Dr. Nelda Severe at the wound clinic in [facility].  He is not interested in any intervention including angiography of right lower extremity at this time.  He is unsure of his anticipated discharge date from CIR  Past Medical History:  Diagnosis Date   AICD (automatic cardioverter/defibrillator) present 01/11/2017   Anxiety    CHF (congestive heart failure) (HCC)    CKD (chronic kidney disease)    COPD (chronic obstructive pulmonary disease) (HCC)    never smoker, industrial exposure   Diabetes mellitus with diabetic neuropathy, with long-term current use of insulin (HCC)    GERD (gastroesophageal reflux disease)    High cholesterol    History of gout    "take RX qd" (11/01/2017)   Hypertension    Nonobstructive atherosclerosis of coronary artery    On home oxygen therapy    "2L prn" (11/01/2017)   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HCC)    LLL, present for years   Systolic HF (heart failure) (HCC)     Past Surgical History:  Procedure Laterality Date   AMPUTATION Left 01/21/2021   Procedure: AMPUTATION BELOW KNEE;  Surgeon: Nadara Mustard, MD;  [facility];  Service: Orthopedics;  Laterality: Left;   APPLICATION OF WOUND VAC Left 01/21/2021   Procedure:  APPLICATION OF WOUND VAC;  Surgeon: Nadara Mustard, MD;  Location: [facility] OR;  Service: Orthopedics;  Laterality: Left;   CATARACT EXTRACTION W/ INTRAOCULAR LENS IMPLANTW/ TRABECULECTOMY Left ~ 2013   "may have done this when I had my other eye OR"   CIRCUMCISION     CORONARY STENT INTERVENTION N/A 11/02/2017   Procedure: CORONARY STENT INTERVENTION;  Surgeon: Laurey Morale, MD;  Location[facility] INVASIVE CV LAB;  Service: Cardiovascular;  Laterality: N/A;   CORONARY STENT INTERVENTION N/A 11/02/2017   Procedure: CORONARY STENT INTERVENTION;  Surgeon: Lyn Records, MD;  Location: [facility] INV[facility] LAB;  Service: Cardiovascular;  Laterality: N/A;   EYE SURGERY Left ~ 2013   "fell on something in basement; stuck in my eye"   ICD IMPLANT     Medtronic Dual Chamber 01/11/17   MULTIPLE TOOTH EXTRACTIONS     "pulled all my top teeth"   RIGHT/LEFT HEART CATH AND CORONARY ANGIOGRAPHY N/A 11/02/2017   Procedure: RIGHT/LEFT HEART CATH AND CORONARY ANGIOGRAPHY;  Surgeon: Laurey Morale, MD;  Location: [facility] INVASIVE CV LAB;  Service: Cardiovascular;  Laterality: N/A;   TEE WITHOUT CARDIOVERSION N/A 01/20/2021   Procedure: TRANSESOPHAGEAL ECHOCARDIOGRAM (TEE);  Surgeon: Laurey Morale, MD;  Location: [facility] ENDOSCOPY;  Service: Cardiovascular;  Laterality: N/A;    Allergies  Allergen Reactions   Aspirin Itching   Codeine Hives   Coconut Oil Itching    Prior to Admission  medications   Medication Sig Start Date End Date Taking? Authorizing Provider  acetaminophen (TYLENOL) 650 MG CR tablet Take 650 mg by mouth every 8 (eight) hours as needed for pain.    [provider]  albuterol (VENTOLIN HFA) 108 (90 Base) MCG/ACT inhaler Inhale 1-2 puffs into the lungs every 4 (four) hours as needed for wheezing or shortness of breath. 10/17/20   Alessandra Bevels, MD  allopurinol (ZYLOPRIM) 100 MG tablet Take 1 tablet (100 mg total) by mouth daily. 12/26/17   Clegg, Amy D, NP  aspirin 81 MG chewable tablet Chew 81 mg  by mouth daily.    [provider]  atorvastatin (LIPITOR) 40 MG tablet TAKE ONE TABLET BY MOUTH DAILY AT [facility] 11/15/20   Laurey Morale, MD  bisoprolol (ZEBETA) 5 MG tablet Take 2.5 mg by mouth daily.    [provider]  BREO ELLIPTA 100-25 MCG/INH AEPB Inhale 1 puff into the lungs daily. 01/03/21   [provider]  busPIRone (BUSPAR) 5 MG tablet Take 5 mg by mouth 2 (two) times daily.    [provider]  docusate sodium (COLACE) 100 MG capsule Take 100 mg by mouth daily as needed for mild constipation.    [provider]  DULoxetine (CYMBALTA) 60 MG capsule Take 60 mg by mouth daily. 09/28/17   [provider]  fenofibrate (TRICOR) 145 MG tablet TAKE ONE TABLET BY MOUTH DAILY 12/15/20   Laurey Morale, MD  hydrALAZINE (APRESOLINE) 50 MG tablet Take 1.5 tablets (75 mg total) by mouth 3 (three) times daily. 09/09/20   Laurey Morale, MD  HYDROcodone-acetaminophen (NORCO/VICODIN) 5-325 MG tablet Take 1-2 tablets by mouth every 6 (six) hours as needed for moderate pain (pain score 4-6). 01/26/21   Lurene Shadow, MD  Insulin Pen Needle (NOVOFINE) 30G X 8 MM MISC Inject 10 each into the skin as needed. 11/06/17   Arnetha Courser, MD  insulin regular human CONCENTRATED (HUMULIN R) 500 UNIT/ML kwikpen Inject 0-400 Units into the skin as directed. Sliding 07/18/17   [provider]  isosorbide mononitrate (IMDUR) 30 MG 24 hr tablet Take 1 tablet (30 mg total) by mouth daily. 01/27/21   Lurene Shadow, MD  JARDIANCE 25 MG TABS tablet Take 25 mg by mouth daily. 10/15/19   Laurey Morale, MD  MAGNESIUM-OXIDE 400 (241.3 Mg) MG tablet Take 1 tablet (400 mg total) by mouth daily. 08/27/20   Laurey Morale, MD  Multiple Vitamin (MULTIVITAMIN WITH MINERALS) TABS tablet Take 1 tablet by mouth daily. 01/27/21   Lurene Shadow, MD  nutrition supplement, JUVEN, (JUVEN) PACK Take 1 packet by mouth 2 (two) times daily between meals. 01/26/21   Lurene Shadow, MD   polyethylene glycol (MIRALAX / GLYCOLAX) 17 g packet Take 17 g by mouth daily as needed for mild constipation. 01/26/21   Lurene Shadow, MD  pregabalin (LYRICA) 150 MG capsule Take 150 mg by mouth 3 (three) times daily. 10/07/20   [provider]  sacubitril-valsartan (ENTRESTO) 24-26 MG Take 1 tablet by mouth 2 (two) times daily. 01/26/21   Lurene Shadow, MD  spironolactone (ALDACTONE) 25 MG tablet Take 0.5 tablets (12.5 mg total) by mouth daily. 01/27/21   Lurene Shadow, MD  SSD 1 % cream Apply 1 application topically daily as needed for wound care. 12/20/20   [provider]  Torsemide 40 MG TABS Take 40 mg by mouth daily. 01/26/21   Lurene Shadow, MD  zinc sulfate 220 (50 Zn) MG capsule Take  1 capsule (220 mg total) by mouth daily. 01/27/21   Lurene Shadow, MD  gabapentin (NEURONTIN) 100 MG capsule Take 1 capsule (100 mg total) by mouth 3 (three) times daily. 01/26/21 01/26/21  Lurene Shadow, MD    Social History   Socioeconomic History   Marital status: Divorced    Spouse name: Not on file   Number of children: Not on file   Years of education: Not on file   Highest education level: Not on file  Occupational History   Occupation: disability    Comment: stopped working in 2000 d/t occupational exposures  Tobacco Use   Smoking status: Former    Types: Cigarettes   Smokeless tobacco: Never   Tobacco comments:    "smoked when I drank"  Vaping Use   Vaping Use: Never used  Substance and Sexual Activity   Alcohol use: Not Currently    Comment: 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  Other Topics Concern   Not on file  Social History Narrative   Patient lives at home with wife and some of his 10 children. He self-administers his own medications.   Social Determinants of Health   Financial Resource Strain: Low Risk    Difficulty of Paying Living Expenses: Not very hard  Food Insecurity: No Food  Insecurity   Worried About Programme researcher, broadcasting/film/video in the Last Year: Never true   Ran Out of Food in the Last Year: Never true  Transportation Needs: No Transportation Needs   Lack of Transportation (Medical): No   Lack of Transportation (Non-Medical): No  Physical Activity: Not on file  Stress: Not on file  Social Connections: Not on file  Intimate Partner Violence: Not on file     Family History  Problem Relation Age of Onset   Hypertension Mother    Diabetes Mother    Hypertension Father    Diabetes Father    Diabetes Sister    Diabetes Brother     ROS: Otherwise negative unless mentioned in HPI  Physical Examination  Vitals:   01/30/21 1956 01/31/21 0514  BP: (!) 94/51 99/73  Pulse: 79 72  Resp: 18   Temp: 98.6 F (37 C)   SpO2: 93% 98%   Body mass index is 29.33 kg/m.  General:  WDWN in NAD Gait: Not observed HENT: WNL, normocephalic Pulmonary: normal non-labored breathing, without Rales, rhonchi,  wheezing Cardiac: regular Abdomen:  soft, NT/ND, no masses Skin: without rashes Vascular Exam/Pulses: 1+ R PT pulse Extremities: large area of blistering of base of GT and plantar foot Musculoskeletal: no muscle wasting or atrophy  Neurologic: A&O X 3;  No focal weakness or paresthesias are detected; speech is fluent/normal Psychiatric:  The pt has Normal affect. Lymph:  Unremarkable  CBC    Component Value Date/Time   WBC 10.1 01/31/2021 0316   RBC 4.44 01/31/2021 0316   HGB 12.4 (L) 01/31/2021 0316   HCT 39.6 01/31/2021 0316   PLT 308 01/31/2021 0316   MCV 89.2 01/31/2021 0316   MCH 27.9 01/31/2021 0316   MCHC 31.3 01/31/2021 0316   RDW 18.1 (H) 01/31/2021 0316   LYMPHSABS 1.8 01/28/2021 0411   MONOABS 1.3 (H) 01/28/2021 0411   EOSABS 0.1 01/28/2021 0411   BASOSABS 0.0 01/28/2021 0411    BMET    Component Value Date/Time   NA 131 (L) 01/31/2021 0316   K 3.8 01/31/2021 0316  CL 95 (L) 01/31/2021 0316   CO2 29 01/31/2021 0316   GLUCOSE 149 (H)  01/31/2021 0316   BUN 43 (H) 01/31/2021 0316   CREATININE 1.30 (H) 01/31/2021 0316   CALCIUM 8.6 (L) 01/31/2021 0316   GFRNONAA >60 01/31/2021 0316   GFRAA >60 01/02/2020 1516    COAGS: Lab Results  Component Value Date   INR 1.12 11/01/2017     Non-Invasive Vascular Imaging:   ABI/TBI demonstrates 1.06 and 0.41 as of 01/24/2021    ASSESSMENT/PLAN: This is a 60 y.o. male with PAD and wound of right foot  -Patient is status post below the knee amputation by Dr. Lajoyce Corners of left lower extremity.  He also has a wound of his right foot.  As of 01/24/2021 right ABI is 1.06 and TBI of 0.41.  Patient states he is receiving wound care in [facility] and was told his circulation is "good."  I may be able to feel a PT pulse however given the extent of tissue loss and TBI of 0.4 recommendation would include angiography to better evaluate.  After explaining the procedure the patient is not willing to undergo any further surgery at this time.  He however will be happy to establish care with [place] here in [place] after discharge.  On-call vascular surgeon Dr. Chestine Spore will evaluate the patient later today and provide further treatment plans   Emilie Rutter PA-C [facility] [phone]   I have seen and evaluated the patient. I agree with the PA note as documented above.  60 year old male admitted to rehab after recent left below-knee amputation by Dr. Lajoyce Corners.  Vascular surgery was consulted for a blister on the right foot.  Patient states this started after his shoe rubbed a wound on the foot.  He actually has fairly extensive tissue loss as pictured.  I reviewed his recent ABIs that are 1.06 and likely inaccurate given the waveforms are very blunted and monophasic.  He has a severely reduced toe pressure 41.  He does have femoral pulses that are palpable bilaterally.  Discussed that I think he would benefit from aortogram with right lower extremity arteriogram tomorrow in the Cath Lab.  This  will be with my partner Dr. Myra Gianotti.  I discussed my concern that without intervention he will be at high risk for losing his other leg given the extent of his tissue loss.  Keep n.p.o. after midnight.   Cephus Shelling, MD [facility] [place] Office: [phone]

## 2021-01-31 NOTE — Progress Notes (Signed)
Occupational Therapy Session Note  Patient Details  Name: Richard [name] MRN: [id number] Date of Birth: 06/08/1961  Today's Date: 01/31/2021 OT Individual Time: 1330-1410 OT Individual Time Calculation (min): 40 min    Short Term Goals: Week 1:  OT Short Term Goal 1 (Week 1): Pt will complete toilet or BSC transfer using LRAD and Min A OT Short Term Goal 2 (Week 1): Pt will maintain dynamic standing balance during LB ADL with no more than Min A during 2 consecutive sessions OT Short Term Goal 3 (Week 1): Pt will state 2 strategies to impement at home during daily routine for diabetes management (i.e. diet, daily limb inspection, etc)   Skilled Therapeutic Interventions/Progress Updates:    Pt greeted at time of session sidelying in bed with pt resting saying he felt nauseous but had been feeling this way for a while, no pain, agreeable to OT session. Discussion with pt regarding DC planning, home layout and recent change per MD for WB through RLE only for transfers and no ambulating at this time, pt verbalized understanding. Provided pt with hand out for home measurements discussing specific door measurements needed. Pt using urinal with Supervision at this time sitting EOB. Remainder of session focused on BUE there ex w/ 10# bar for bicep curl, chest press, overhead press with verbal cues for form. Pt in bed resting alarm on call bell in reach.  Therapy Documentation Precautions:  Precautions Precautions: Fall Precaution Comments: Lt BKA with vac, PICC line, sensation deficits in B hands + Rt foot Required Braces or Orthoses: Other Brace Other Brace: L BKA shrinker and limb protector Restrictions Weight Bearing Restrictions: Yes LLE Weight Bearing: Non weight bearing    Therapy/Group: Individual Therapy  Erasmo Score 01/31/2021, 12:57 PM

## 2021-02-01 ENCOUNTER — Encounter (HOSPITAL_COMMUNITY): Payer: Self-pay | Admitting: Surgery

## 2021-02-01 ENCOUNTER — Other Ambulatory Visit: Payer: Self-pay

## 2021-02-01 ENCOUNTER — Ambulatory Visit (HOSPITAL_COMMUNITY): Admit: 2021-02-01 | Payer: Medicare Other | Admitting: Surgery

## 2021-02-01 ENCOUNTER — Inpatient Hospital Stay (HOSPITAL_COMMUNITY): Payer: Medicare Other

## 2021-02-01 ENCOUNTER — Encounter (HOSPITAL_COMMUNITY): Admission: RE | Disposition: A | Discharge status: Rehabilitation Facility | Payer: Self-pay | Attending: Surgery

## 2021-02-01 ENCOUNTER — Inpatient Hospital Stay (HOSPITAL_COMMUNITY)
Admission: RE | Admit: 2021-02-01 | Discharge: 2021-02-07 | DRG: 617 | Disposition: A | Discharge status: Rehabilitation Facility | Payer: Medicare Other | Source: Other Acute Inpatient Hospital | Attending: Surgery | Admitting: Surgery

## 2021-02-01 DIAGNOSIS — E11621 Type 2 diabetes mellitus with foot ulcer: Secondary | ICD-10-CM | POA: Diagnosis present

## 2021-02-01 DIAGNOSIS — I70234 Atherosclerosis of native arteries of right leg with ulceration of heel and midfoot: Secondary | ICD-10-CM

## 2021-02-01 DIAGNOSIS — Z89512 Acquired absence of left leg below knee: Secondary | ICD-10-CM

## 2021-02-01 DIAGNOSIS — I2510 Atherosclerotic heart disease of native coronary artery without angina pectoris: Secondary | ICD-10-CM | POA: Diagnosis present

## 2021-02-01 DIAGNOSIS — Z833 Family history of diabetes mellitus: Secondary | ICD-10-CM

## 2021-02-01 DIAGNOSIS — Z9981 Dependence on supplemental oxygen: Secondary | ICD-10-CM

## 2021-02-01 DIAGNOSIS — Z794 Long term (current) use of insulin: Secondary | ICD-10-CM

## 2021-02-01 DIAGNOSIS — Z8249 Family history of ischemic heart disease and other diseases of the circulatory system: Secondary | ICD-10-CM

## 2021-02-01 DIAGNOSIS — E1169 Type 2 diabetes mellitus with other specified complication: Principal | ICD-10-CM | POA: Diagnosis present

## 2021-02-01 DIAGNOSIS — Z87891 Personal history of nicotine dependence: Secondary | ICD-10-CM

## 2021-02-01 DIAGNOSIS — E1140 Type 2 diabetes mellitus with diabetic neuropathy, unspecified: Secondary | ICD-10-CM | POA: Diagnosis present

## 2021-02-01 DIAGNOSIS — E1152 Type 2 diabetes mellitus with diabetic peripheral angiopathy with gangrene: Principal | ICD-10-CM

## 2021-02-01 DIAGNOSIS — I70201 Unspecified atherosclerosis of native arteries of extremities, right leg: Secondary | ICD-10-CM | POA: Diagnosis present

## 2021-02-01 DIAGNOSIS — L97519 Non-pressure chronic ulcer of other part of right foot with unspecified severity: Secondary | ICD-10-CM | POA: Diagnosis present

## 2021-02-01 DIAGNOSIS — L02611 Cutaneous abscess of right foot: Secondary | ICD-10-CM | POA: Diagnosis present

## 2021-02-01 DIAGNOSIS — Z955 Presence of coronary angioplasty implant and graft: Secondary | ICD-10-CM

## 2021-02-01 DIAGNOSIS — E1122 Type 2 diabetes mellitus with diabetic chronic kidney disease: Secondary | ICD-10-CM | POA: Diagnosis present

## 2021-02-01 DIAGNOSIS — Z7984 Long term (current) use of oral hypoglycemic drugs: Secondary | ICD-10-CM

## 2021-02-01 DIAGNOSIS — Z91018 Allergy to other foods: Secondary | ICD-10-CM

## 2021-02-01 DIAGNOSIS — N189 Chronic kidney disease, unspecified: Secondary | ICD-10-CM | POA: Diagnosis present

## 2021-02-01 DIAGNOSIS — L03115 Cellulitis of right lower limb: Secondary | ICD-10-CM | POA: Diagnosis present

## 2021-02-01 DIAGNOSIS — M109 Gout, unspecified: Secondary | ICD-10-CM | POA: Diagnosis present

## 2021-02-01 DIAGNOSIS — Z7982 Long term (current) use of aspirin: Secondary | ICD-10-CM

## 2021-02-01 DIAGNOSIS — Z95810 Presence of automatic (implantable) cardiac defibrillator: Secondary | ICD-10-CM

## 2021-02-01 DIAGNOSIS — E7800 Pure hypercholesterolemia, unspecified: Secondary | ICD-10-CM | POA: Diagnosis present

## 2021-02-01 DIAGNOSIS — I130 Hypertensive heart and chronic kidney disease with heart failure and stage 1 through stage 4 chronic kidney disease, or unspecified chronic kidney disease: Secondary | ICD-10-CM | POA: Diagnosis present

## 2021-02-01 DIAGNOSIS — Z885 Allergy status to narcotic agent status: Secondary | ICD-10-CM

## 2021-02-01 DIAGNOSIS — J449 Chronic obstructive pulmonary disease, unspecified: Secondary | ICD-10-CM | POA: Diagnosis present

## 2021-02-01 DIAGNOSIS — I5022 Chronic systolic (congestive) heart failure: Secondary | ICD-10-CM | POA: Diagnosis present

## 2021-02-01 DIAGNOSIS — I739 Peripheral vascular disease, unspecified: Secondary | ICD-10-CM | POA: Diagnosis present

## 2021-02-01 DIAGNOSIS — M868X7 Other osteomyelitis, ankle and foot: Secondary | ICD-10-CM | POA: Diagnosis present

## 2021-02-01 DIAGNOSIS — F419 Anxiety disorder, unspecified: Secondary | ICD-10-CM | POA: Diagnosis present

## 2021-02-01 DIAGNOSIS — Z7951 Long term (current) use of inhaled steroids: Secondary | ICD-10-CM

## 2021-02-01 DIAGNOSIS — Z886 Allergy status to analgesic agent status: Secondary | ICD-10-CM

## 2021-02-01 DIAGNOSIS — K219 Gastro-esophageal reflux disease without esophagitis: Secondary | ICD-10-CM | POA: Diagnosis present

## 2021-02-01 DIAGNOSIS — Z79899 Other long term (current) drug therapy: Secondary | ICD-10-CM

## 2021-02-01 HISTORY — PX: ABDOMINAL AORTOGRAM W/LOWER EXTREMITY: CATH118223

## 2021-02-01 HISTORY — PX: PERIPHERAL VASCULAR BALLOON ANGIOPLASTY: CATH118281

## 2021-02-01 LAB — GLUCOSE, CAPILLARY
Glucose-Capillary: 133 mg/dL — ABNORMAL HIGH (ref 70–99)
Glucose-Capillary: 100 mg/dL — ABNORMAL HIGH (ref 70–99)
Glucose-Capillary: 164 mg/dL — ABNORMAL HIGH (ref 70–99)
Glucose-Capillary: 228 mg/dL — ABNORMAL HIGH (ref 70–99)

## 2021-02-01 LAB — POCT ACTIVATED CLOTTING TIME: Activated Clotting Time: 265 seconds

## 2021-02-01 SURGERY — ABDOMINAL AORTOGRAM W/LOWER EXTREMITY
Anesthesia: LOCAL

## 2021-02-01 MED ORDER — SACUBITRIL-VALSARTAN 24-26 MG PO TABS
1.0000 | ORAL_TABLET | Freq: Two times a day (BID) | ORAL | Status: DC
  Administered 2021-02-01 – 2021-02-06 (×9): 1 via ORAL
  Filled 2021-02-01 (×11): qty 1

## 2021-02-01 MED ORDER — POLYETHYLENE GLYCOL 3350 17 G PO PACK: 17.0000 g | PACK | Freq: Every day | ORAL | Status: DC | PRN

## 2021-02-01 MED ORDER — HYDROCODONE-ACETAMINOPHEN 5-325 MG PO TABS
1.0000 | ORAL_TABLET | Freq: Four times a day (QID) | ORAL | Status: DC | PRN
  Administered 2021-02-01: 2 via ORAL
  Administered 2021-02-02: 1 via ORAL
  Administered 2021-02-05 – 2021-02-06 (×3): 2 via ORAL
  Filled 2021-02-01 (×3): qty 2
  Filled 2021-02-01: qty 1
  Filled 2021-02-01: qty 2

## 2021-02-01 MED ORDER — BUSPIRONE HCL 5 MG PO TABS
5.0000 mg | ORAL_TABLET | Freq: Two times a day (BID) | ORAL | Status: DC
  Administered 2021-02-01 – 2021-02-07 (×11): 5 mg via ORAL
  Filled 2021-02-01 (×12): qty 1

## 2021-02-01 MED ORDER — HEPARIN SODIUM (PORCINE) 1000 UNIT/ML IJ SOLN
INTRAMUSCULAR | Status: AC
  Filled 2021-02-01: qty 1

## 2021-02-01 MED ORDER — TORSEMIDE 20 MG PO TABS
40.0000 mg | ORAL_TABLET | Freq: Every day | ORAL | Status: DC
  Administered 2021-02-01 – 2021-02-07 (×6): 40 mg via ORAL
  Filled 2021-02-01 (×7): qty 2

## 2021-02-01 MED ORDER — ISOSORBIDE MONONITRATE ER 30 MG PO TB24
30.0000 mg | ORAL_TABLET | Freq: Every day | ORAL | Status: DC
  Administered 2021-02-01 – 2021-02-07 (×4): 30 mg via ORAL
  Filled 2021-02-01 (×7): qty 1

## 2021-02-01 MED ORDER — HEPARIN SODIUM (PORCINE) 1000 UNIT/ML IJ SOLN
INTRAMUSCULAR | Status: DC | PRN
  Administered 2021-02-01: 12000 [IU] via INTRAVENOUS

## 2021-02-01 MED ORDER — DULOXETINE HCL 60 MG PO CPEP
60.0000 mg | ORAL_CAPSULE | Freq: Every day | ORAL | Status: DC
  Administered 2021-02-01 – 2021-02-07 (×6): 60 mg via ORAL
  Filled 2021-02-01 (×7): qty 1

## 2021-02-01 MED ORDER — ROSUVASTATIN CALCIUM 5 MG PO TABS
10.0000 mg | ORAL_TABLET | Freq: Every day | ORAL | Status: DC
  Administered 2021-02-01 – 2021-02-06 (×5): 10 mg via ORAL
  Filled 2021-02-01 (×6): qty 2

## 2021-02-01 MED ORDER — SODIUM CHLORIDE 0.9 % IV SOLN: 250.0000 mL | INTRAVENOUS | Status: DC | PRN

## 2021-02-01 MED ORDER — PREGABALIN 75 MG PO CAPS: 150.0000 mg | ORAL_CAPSULE | Freq: Three times a day (TID) | ORAL | Status: DC

## 2021-02-01 MED ORDER — CLOPIDOGREL BISULFATE 75 MG PO TABS: 75.0000 mg | ORAL_TABLET | Freq: Every day | ORAL | Status: DC

## 2021-02-01 MED ORDER — HEPARIN (PORCINE) IN NACL 1000-0.9 UT/500ML-% IV SOLN
INTRAVENOUS | Status: AC
  Filled 2021-02-01: qty 1000

## 2021-02-01 MED ORDER — CLOPIDOGREL BISULFATE 75 MG PO TABS: 300.0000 mg | ORAL_TABLET | Freq: Once | ORAL | Status: DC

## 2021-02-01 MED ORDER — ALBUTEROL SULFATE HFA 108 (90 BASE) MCG/ACT IN AERS: 1.0000 | INHALATION_SPRAY | RESPIRATORY_TRACT | Status: DC | PRN

## 2021-02-01 MED ORDER — INSULIN ASPART 100 UNIT/ML IJ SOLN
0.0000 [IU] | Freq: Three times a day (TID) | INTRAMUSCULAR | Status: DC
  Administered 2021-02-01: 3 [IU] via SUBCUTANEOUS
  Administered 2021-02-02 (×2): 2 [IU] via SUBCUTANEOUS
  Administered 2021-02-03: 3 [IU] via SUBCUTANEOUS
  Administered 2021-02-03: 8 [IU] via SUBCUTANEOUS
  Administered 2021-02-03 – 2021-02-04 (×2): 3 [IU] via SUBCUTANEOUS
  Administered 2021-02-05 (×2): 2 [IU] via SUBCUTANEOUS
  Administered 2021-02-05: 3 [IU] via SUBCUTANEOUS
  Administered 2021-02-07: 2 [IU] via SUBCUTANEOUS

## 2021-02-01 MED ORDER — SODIUM CHLORIDE 0.9% FLUSH: 3.0000 mL | INTRAVENOUS | Status: DC | PRN

## 2021-02-01 MED ORDER — MIDAZOLAM HCL 2 MG/2ML IJ SOLN
INTRAMUSCULAR | Status: AC
  Filled 2021-02-01: qty 2

## 2021-02-01 MED ORDER — JUVEN PO PACK
1.0000 | PACK | Freq: Two times a day (BID) | ORAL | Status: DC
  Administered 2021-02-01 – 2021-02-07 (×10): 1 via ORAL
  Filled 2021-02-01 (×11): qty 1

## 2021-02-01 MED ORDER — CLOPIDOGREL BISULFATE 75 MG PO TABS
75.0000 mg | ORAL_TABLET | Freq: Every day | ORAL | Status: DC
  Administered 2021-02-02 – 2021-02-07 (×5): 75 mg via ORAL
  Filled 2021-02-01 (×6): qty 1

## 2021-02-01 MED ORDER — LIDOCAINE HCL (PF) 1 % IJ SOLN
INTRAMUSCULAR | Status: DC | PRN
  Administered 2021-02-01: 15 mL via SUBCUTANEOUS

## 2021-02-01 MED ORDER — ONDANSETRON HCL 4 MG/2ML IJ SOLN
4.0000 mg | Freq: Four times a day (QID) | INTRAMUSCULAR | Status: DC | PRN
  Administered 2021-02-05: 4 mg via INTRAVENOUS
  Filled 2021-02-01 (×2): qty 2

## 2021-02-01 MED ORDER — IODIXANOL 320 MG/ML IV SOLN
INTRAVENOUS | Status: DC | PRN
  Administered 2021-02-01: 130 mL via INTRA_ARTERIAL

## 2021-02-01 MED ORDER — ZINC SULFATE 220 (50 ZN) MG PO CAPS
220.0000 mg | ORAL_CAPSULE | Freq: Every day | ORAL | Status: DC
  Administered 2021-02-02 – 2021-02-07 (×4): 220 mg via ORAL
  Filled 2021-02-01 (×6): qty 1

## 2021-02-01 MED ORDER — ASPIRIN 81 MG PO CHEW
81.0000 mg | CHEWABLE_TABLET | Freq: Every day | ORAL | Status: DC
  Administered 2021-02-01 – 2021-02-07 (×6): 81 mg via ORAL
  Filled 2021-02-01 (×7): qty 1

## 2021-02-01 MED ORDER — CLOPIDOGREL BISULFATE 300 MG PO TABS
ORAL_TABLET | ORAL | Status: DC | PRN
  Administered 2021-02-01: 300 mg via ORAL

## 2021-02-01 MED ORDER — EMPAGLIFLOZIN 10 MG PO TABS
10.0000 mg | ORAL_TABLET | Freq: Every day | ORAL | Status: DC
  Administered 2021-02-02 – 2021-02-07 (×5): 10 mg via ORAL
  Filled 2021-02-01 (×7): qty 1

## 2021-02-01 MED ORDER — SENNOSIDES-DOCUSATE SODIUM 8.6-50 MG PO TABS
2.0000 | ORAL_TABLET | Freq: Every day | ORAL | Status: DC
  Administered 2021-02-01 – 2021-02-06 (×3): 2 via ORAL
  Filled 2021-02-01 (×5): qty 2

## 2021-02-01 MED ORDER — CLOPIDOGREL BISULFATE 75 MG PO TABS
225.0000 mg | ORAL_TABLET | Freq: Every day | ORAL | Status: DC
  Administered 2021-02-03: 225 mg via ORAL
  Filled 2021-02-01 (×3): qty 3

## 2021-02-01 MED ORDER — ACETAMINOPHEN ER 650 MG PO TBCR: 650.0000 mg | EXTENDED_RELEASE_TABLET | Freq: Three times a day (TID) | ORAL | Status: DC | PRN

## 2021-02-01 MED ORDER — FENTANYL CITRATE (PF) 100 MCG/2ML IJ SOLN
INTRAMUSCULAR | Status: DC | PRN
  Administered 2021-02-01: 50 ug via INTRAVENOUS
  Administered 2021-02-01 (×3): 25 ug via INTRAVENOUS

## 2021-02-01 MED ORDER — SODIUM CHLORIDE 0.9 % WEIGHT BASED INFUSION
1.0000 mL/kg/h | INTRAVENOUS | Status: AC
  Administered 2021-02-01: 1 mL/kg/h via INTRAVENOUS

## 2021-02-01 MED ORDER — INSULIN ASPART 100 UNIT/ML IJ SOLN
4.0000 [IU] | Freq: Three times a day (TID) | INTRAMUSCULAR | Status: DC
  Administered 2021-02-02 – 2021-02-07 (×12): 4 [IU] via SUBCUTANEOUS

## 2021-02-01 MED ORDER — ALBUTEROL SULFATE (2.5 MG/3ML) 0.083% IN NEBU: 2.5000 mg | INHALATION_SOLUTION | RESPIRATORY_TRACT | Status: DC | PRN

## 2021-02-01 MED ORDER — FENTANYL CITRATE (PF) 100 MCG/2ML IJ SOLN
INTRAMUSCULAR | Status: AC
  Filled 2021-02-01: qty 2

## 2021-02-01 MED ORDER — CLOPIDOGREL BISULFATE 300 MG PO TABS
ORAL_TABLET | ORAL | Status: AC
  Filled 2021-02-01: qty 1

## 2021-02-01 MED ORDER — BISOPROLOL FUMARATE 5 MG PO TABS
2.5000 mg | ORAL_TABLET | Freq: Every day | ORAL | Status: DC
  Administered 2021-02-01 – 2021-02-07 (×5): 2.5 mg via ORAL
  Filled 2021-02-01 (×7): qty 1

## 2021-02-01 MED ORDER — ASCORBIC ACID 500 MG PO TABS
1000.0000 mg | ORAL_TABLET | Freq: Every day | ORAL | Status: DC
  Administered 2021-02-01 – 2021-02-07 (×5): 1000 mg via ORAL
  Filled 2021-02-01 (×7): qty 2

## 2021-02-01 MED ORDER — INSULIN ASPART 100 UNIT/ML IJ SOLN
0.0000 [IU] | Freq: Every day | INTRAMUSCULAR | Status: DC
  Administered 2021-02-01 – 2021-02-04 (×3): 2 [IU] via SUBCUTANEOUS

## 2021-02-01 MED ORDER — ATORVASTATIN CALCIUM 40 MG PO TABS
40.0000 mg | ORAL_TABLET | Freq: Every evening | ORAL | Status: DC
  Administered 2021-02-01 – 2021-02-06 (×5): 40 mg via ORAL
  Filled 2021-02-01 (×6): qty 1

## 2021-02-01 MED ORDER — LABETALOL HCL 5 MG/ML IV SOLN: 10.0000 mg | INTRAVENOUS | Status: DC | PRN

## 2021-02-01 MED ORDER — SPIRONOLACTONE 12.5 MG HALF TABLET
12.5000 mg | ORAL_TABLET | Freq: Every day | ORAL | Status: DC
  Administered 2021-02-02 – 2021-02-07 (×4): 12.5 mg via ORAL
  Filled 2021-02-01 (×7): qty 1

## 2021-02-01 MED ORDER — LIDOCAINE HCL (PF) 1 % IJ SOLN
INTRAMUSCULAR | Status: AC
  Filled 2021-02-01: qty 30

## 2021-02-01 MED ORDER — ACETAMINOPHEN 325 MG PO TABS
650.0000 mg | ORAL_TABLET | ORAL | Status: DC | PRN
  Administered 2021-02-06: 650 mg via ORAL
  Filled 2021-02-01 (×2): qty 2

## 2021-02-01 MED ORDER — INSULIN GLARGINE-YFGN 100 UNIT/ML ~~LOC~~ SOLN
13.0000 [IU] | Freq: Two times a day (BID) | SUBCUTANEOUS | Status: DC
  Administered 2021-02-01 – 2021-02-07 (×12): 13 [IU] via SUBCUTANEOUS
  Filled 2021-02-01 (×14): qty 0.13

## 2021-02-01 MED ORDER — FENOFIBRATE 160 MG PO TABS: 160.0000 mg | ORAL_TABLET | Freq: Every day | ORAL | Status: DC

## 2021-02-01 MED ORDER — ROSUVASTATIN CALCIUM 5 MG PO TABS: 10.0000 mg | ORAL_TABLET | Freq: Every day | ORAL | Status: DC

## 2021-02-01 MED ORDER — ADULT MULTIVITAMIN W/MINERALS CH
1.0000 | ORAL_TABLET | Freq: Every day | ORAL | Status: DC
  Administered 2021-02-01 – 2021-02-07 (×5): 1 via ORAL
  Filled 2021-02-01 (×7): qty 1

## 2021-02-01 MED ORDER — HYDRALAZINE HCL 20 MG/ML IJ SOLN: 5.0000 mg | INTRAMUSCULAR | Status: DC | PRN

## 2021-02-01 MED ORDER — GABAPENTIN 100 MG PO CAPS
100.0000 mg | ORAL_CAPSULE | Freq: Three times a day (TID) | ORAL | Status: DC
  Administered 2021-02-01 – 2021-02-07 (×15): 100 mg via ORAL
  Filled 2021-02-01 (×17): qty 1

## 2021-02-01 MED ORDER — PANTOPRAZOLE SODIUM 40 MG PO TBEC
40.0000 mg | DELAYED_RELEASE_TABLET | Freq: Two times a day (BID) | ORAL | Status: DC
  Administered 2021-02-01 – 2021-02-07 (×11): 40 mg via ORAL
  Filled 2021-02-01 (×12): qty 1

## 2021-02-01 MED ORDER — EMPAGLIFLOZIN 25 MG PO TABS: 25.0000 mg | ORAL_TABLET | Freq: Every day | ORAL | Status: DC

## 2021-02-01 MED ORDER — MORPHINE SULFATE (PF) 4 MG/ML IV SOLN
2.0000 mg | INTRAVENOUS | Status: DC | PRN
Start: 2021-02-01 — End: 2021-02-07

## 2021-02-01 MED ORDER — INSULIN REGULAR HUMAN (CONC) 500 UNIT/ML ~~LOC~~ SOPN: 0.0000 [IU] | PEN_INJECTOR | SUBCUTANEOUS | Status: DC

## 2021-02-01 MED ORDER — MAGNESIUM-OXIDE 400 (241.3 MG) MG PO TABS: 400.0000 mg | ORAL_TABLET | Freq: Every day | ORAL | Status: DC

## 2021-02-01 MED ORDER — HEPARIN (PORCINE) IN NACL 1000-0.9 UT/500ML-% IV SOLN
INTRAVENOUS | Status: DC | PRN
  Administered 2021-02-01 (×2): 500 mL

## 2021-02-01 MED ORDER — MIDAZOLAM HCL 2 MG/2ML IJ SOLN
INTRAMUSCULAR | Status: DC | PRN
  Administered 2021-02-01 (×2): 1 mg via INTRAVENOUS
  Administered 2021-02-01: 2 mg via INTRAVENOUS
  Administered 2021-02-01: 1 mg via INTRAVENOUS

## 2021-02-01 MED ORDER — FLUTICASONE FUROATE-VILANTEROL 100-25 MCG/INH IN AEPB: 1.0000 | INHALATION_SPRAY | Freq: Every day | RESPIRATORY_TRACT | Status: DC

## 2021-02-01 MED ORDER — SODIUM CHLORIDE 0.9% FLUSH
3.0000 mL | Freq: Two times a day (BID) | INTRAVENOUS | Status: DC
  Administered 2021-02-02 (×2): 3 mL via INTRAVENOUS
  Administered 2021-02-02: 20 mL via INTRAVENOUS
  Administered 2021-02-03 – 2021-02-07 (×7): 3 mL via INTRAVENOUS

## 2021-02-01 MED ORDER — ALLOPURINOL 100 MG PO TABS
100.0000 mg | ORAL_TABLET | Freq: Every day | ORAL | Status: DC
  Administered 2021-02-01 – 2021-02-07 (×6): 100 mg via ORAL
  Filled 2021-02-01 (×7): qty 1

## 2021-02-01 SURGICAL SUPPLY — 22 items
BALLN STERLING OTW 3X100X150 (BALLOONS) ×3
BALLOON STERLING OTW 3X100X150 (BALLOONS) ×2 IMPLANT
CATH NAVICROSS ANG 65CM (CATHETERS) ×2 IMPLANT
CATH OMNI FLUSH 5F 65CM (CATHETERS) ×3 IMPLANT
CATHETER NAVICROSS ANG 65CM (CATHETERS) ×3
DEVICE TORQUE H2O (MISCELLANEOUS) ×3 IMPLANT
DEVICE VASC CLSR CELT ART 6 (Vascular Products) ×3 IMPLANT
DRAPE C-ARM 35X43 STRL (DRAPES) ×3 IMPLANT
GUIDEWIRE ANGLED .035X150CM (WIRE) ×3 IMPLANT
KIT ENCORE 26 ADVANTAGE (KITS) ×3 IMPLANT
KIT MICROPUNCTURE NIT STIFF (SHEATH) ×3 IMPLANT
KIT PV (KITS) ×3 IMPLANT
MAT PREVALON FULL STRYKER (MISCELLANEOUS) ×3 IMPLANT
SHEATH HIGHFLEX ANSEL 6FRX55 (SHEATH) ×3 IMPLANT
SHEATH PINNACLE 5F 10CM (SHEATH) ×3 IMPLANT
SHEATH PINNACLE 6F 10CM (SHEATH) ×3 IMPLANT
SHEATH PROBE COVER 6X72 (BAG) ×3 IMPLANT
TRANSDUCER W/STOPCOCK (MISCELLANEOUS) ×3 IMPLANT
TRAY PV CATH (CUSTOM PROCEDURE TRAY) ×3 IMPLANT
WIRE BENTSON .035X145CM (WIRE) ×3 IMPLANT
WIRE G V18X300CM (WIRE) ×3 IMPLANT
WIRE ROSEN-J .035X180CM (WIRE) ×3 IMPLANT

## 2021-02-01 NOTE — Progress Notes (Signed)
Physical Therapy Session Note  Patient Details  Name: Richard [name] MRN: [id number] Date of Birth: 07-Jul-1960  Today's Date: 02/01/2021 PT Missed Time: 60 Minutes Missed Time Reason: Patient fatigue  Pt received supine in bed reporting "I don't think I can do nothing today" stating "I just don't have the strength." Pt reports he has been NPO since 11:00PM last night in preparation for his vascular surgery today at 2:00PM. Pt requesting to rest at this time. Missed 60 minutes of skilled physical therapy.  Ginny Forth , PT, DPT, NCS, CSRS  02/01/2021, 11:00 AM

## 2021-02-01 NOTE — Progress Notes (Signed)
PROGRESS NOTE   Subjective/Complaints:  Pt reports he feels sick to stomach and not sure "he wants arteriogram"- explained to him it's needed and we can give him something for nausea- per team, pt not allowed to come back here after arteriogram and needs to go to stepdown- so will need to be readmitted.    ROS:    Pt denies SOB, abd pain, CP, C/D, and vision changes    Objective:   DG Foot Complete Right  Result Date: 02/01/2021 CLINICAL DATA:  Nonhealing wound of the medial foot EXAM: RIGHT FOOT COMPLETE - 3+ VIEW COMPARISON:  None. FINDINGS: Soft tissue swelling of the plantar medial forefoot. No plain radiographic evidence of osteomyelitis or septic arthritis. Mild degenerative arthritis of the MCP joint of the great toe. There is a metallic foreign object in the plantar soft tissues at the level of the proximal second and third toes. This looks like part of a needle. IMPRESSION: No evidence of osteomyelitis. Marked soft tissue swelling of the medial plantar forefoot. Small segment of radiopaque foreign object that looks like part of a needle. Electronically Signed   By: Paulina Fusi M.D.   On: 02/01/2021 10:05   Recent Labs    01/31/21 0316  WBC 10.1  HGB 12.4*  HCT 39.6  PLT 308   Recent Labs    01/31/21 0316  NA 131*  K 3.8  CL 95*  CO2 29  GLUCOSE 149*  BUN 43*  CREATININE 1.30*  CALCIUM 8.6*    Intake/Output Summary (Last 24 hours) at 02/01/2021 1053 Last data filed at 02/01/2021 0958 Gross per 24 hour  Intake 670 ml  Output 2500 ml  Net -1830 ml        Physical Exam: Vital Signs Blood pressure 102/74, pulse 76, temperature 97.8 F (36.6 C), resp. rate 20, height 6\' 5"  (1.956 m), weight 112.2 kg, SpO2 99 %.   General: awake, alert, appropriate, sitting up in bed; unhappy about ateriogram NAD HENT: conjugate gaze; oropharynx moist CV: regular rate; no JVD Pulmonary: CTA B/L; no W/R/R- good air  movement GI: soft, NT, ND, (+)BS Psychiatric: appropriate Neurological: Ox3  Musculoskeletal:        General: Swelling and tenderness present. On R foot; LL BKA    Cervical back: Normal range of motion and neck supple. Skin:    Comments:  L BKA VAC removed.  -right foot with large ulcerated/separated superfiical layer of epithelium with underlying dermis. No odor. No obvious tunneling.   Neurological:    Cranial Nerves: Cranial nerve deficit present.    Comments: alert and appropriate. UE 4-5/5. RLE 3-4/5 , LLE limited by pain. No focal sensory findings. Cerebellar normal. No abnormal tone.       Assessment/Plan: 1. Functional deficits which require 3+ hours per day of interdisciplinary therapy in a comprehensive inpatient rehab setting. Physiatrist is providing close team supervision and 24 hour management of active medical problems listed below. Physiatrist and rehab team continue to assess barriers to discharge/monitor patient progress toward functional and medical goals  Care Tool:  Bathing    Body parts bathed by patient: Right arm, Left arm, Chest, Abdomen, Front perineal area, Buttocks, Right  upper leg, Face   Body parts bathed by helper: Right lower leg Body parts n/a: Left upper leg, Left lower leg   Bathing assist Assist Level: Minimal Assistance - Patient > 75%     Upper Body Dressing/Undressing Upper body dressing   What is the patient wearing?: Pull over shirt    Upper body assist Assist Level: Minimal Assistance - Patient > 75%    Lower Body Dressing/Undressing Lower body dressing      What is the patient wearing?: Pants     Lower body assist Assist for lower body dressing: Dependent - Patient 0%     Toileting Toileting Toileting Activity did not occur (Clothing management and hygiene only): N/A (no void or bm)  Toileting assist Assist for toileting: 2 Helpers Assistive Device Comment: urinal(set up assist) and BSC(two helpers)    Transfers Chair/bed transfer  Transfers assist  Chair/bed transfer activity did not occur: Safety/medical concerns (pt became orthostatic)  Chair/bed transfer assist level: 2 Helpers     Locomotion Ambulation   Ambulation assist   Ambulation activity did not occur: Safety/medical concerns          Walk 10 feet activity   Assist  Walk 10 feet activity did not occur: Safety/medical concerns        Walk 50 feet activity   Assist Walk 50 feet with 2 turns activity did not occur: Safety/medical concerns         Walk 150 feet activity   Assist Walk 150 feet activity did not occur: Safety/medical concerns         Walk 10 feet on uneven surface  activity   Assist Walk 10 feet on uneven surfaces activity did not occur: Safety/medical concerns         Wheelchair     Assist Will patient use wheelchair at discharge?: Yes Type of Wheelchair: Manual Wheelchair activity did not occur: Safety/medical concerns         Wheelchair 50 feet with 2 turns activity    Assist    Wheelchair 50 feet with 2 turns activity did not occur: Safety/medical concerns       Wheelchair 150 feet activity     Assist  Wheelchair 150 feet activity did not occur: Safety/medical concerns       Blood pressure 102/74, pulse 76, temperature 97.8 F (36.6 C), resp. rate 20, height 6\' 5"  (1.956 m), weight 112.2 kg, SpO2 99 %.    Medical Problem List and Plan: 1.  Functional and mobility deficits secondary to left BKA and multiple medical             -patient may not yet shower             -ELOS/Goals: 14-16 days, supervision goals  -Continue CIR therapies including PT, OT  -discussed healing/prosthetic process with patient  Con't CIR/PT and OT- can NOW shower- will remove VAC this AM. Shower after that. Cover wounds.  -it appears pt will need to go back to acute? After arteriogram since needs step down- we can readmit as interrupted stay if this occurs. Is  still NPO. .   2.  Antithrombotics: -DVT/anticoagulation:  Pharmaceutical: Lovenox             -antiplatelet therapy: Continue ASA 3. Pain Management: pt with incisional and phantom pain             -tylenol for mild pain, hydrocodone for moderate to severe pain             -  robaxin for spasms             -add gabapentin for phantom limb pain  8/8- said can tolerate pain- needs meds- con't regimen 4. Mood: LCSW to follow for evaluation and support.              -antipsychotic agents: N/A 5. Neuropsych: This patient is capable of making decisions on his own behalf. 6. Skin/Wound Care:   -Vac to left stump since 7/29---remove today 8/7   -dry dressing and ACE/shrinker  -aquacell/xeroform to right foot. Keep area clean.  -Requested ortho  Lajoyce Corners) follow up--reach out again monday  -will allow to pivot only on RLE for transfers  8/8- will call Dr Lajoyce Corners to look at R foot- d/w PA; and d/c VAC on LLE. 8/9- Vascular came- Xray done- feel like needs arteriogram today to see if has blood supply- con't per Vascular.   7. Fluids/Electrolytes/Nutrition: Strict I/O. Continue Zinc and Juven to promote wound healing 8. Leucocytosis/Bacteremia: WBC trending down.  Continue antibiotics for 2 more days.             --  wbc's decreased  to 12k 9.  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             -- Encourage pulmonary hygiene.  -continue oxygen to keep sats between 88 and 92%.  10.  Chronic combined CHF: Check daily weights.  Monitor for signs of overload.             -- On Zebeta, hydralazine, Entresto, Jardiance, Aldactone, Imdur, Lipitor and ASA 11.  T2DM: Monitor blood sugars ac/hs and2 use SSI for elevated BS             --8/7 CBGs remain elevated -increase  Lantus to 13u bid -Jardiance and 4 units novolog for meal coverage  CBG (last 3)  Recent Labs    01/31/21 1636 01/31/21 2054 02/01/21 0601  GLUCAP 184* 146* 133*    8/8- BG's controlled  overall- con't regimen for now. Will see if was on insulin prior to admission.   8/9- was on insulin at home- con't regimen/controled 12. Anxiety d/o: Team to provide ego support.             --on buspar and cymbalta. 13. Constipation: Has been on IV reglan and Senna bid 14. Ongoing hiccups: Continue baclofen tid prn--may need it scheduled  15. Orthostatic hypotension: orthostatic vitals ordered daily at 0730. Discontinue hydralazing 10mg  q8H.   8/8- BP running high 90s/50s-70s- don't have Orthostatics for this AM- will monitor  LOS: 5 days A FACE TO FACE EVALUATION WAS PERFORMED  Thaer Miyoshi 02/01/2021, 10:53 AM

## 2021-02-01 NOTE — Progress Notes (Signed)
[facility]  Subjective  - no complaints   Objective 102/74 76 97.8 F (36.6 C) 20 99%  Intake/Output Summary (Last 24 hours) at 02/01/2021 0742 Last data filed at 02/01/2021 0735 Gross per 24 hour  Intake 660 ml  Output 2500 ml  Net -1840 ml    Right foot tissue loss  Laboratory Lab Results: Recent Labs    01/31/21 0316  WBC 10.1  HGB 12.4*  HCT 39.6  PLT 308   BMET Recent Labs    01/31/21 0316  NA 131*  K 3.8  CL 95*  CO2 29  GLUCOSE 149*  BUN 43*  CREATININE 1.30*  CALCIUM 8.6*    COAG Lab Results  Component Value Date   INR 1.12 11/01/2017   No results found for: PTT  Assessment/Planning:  Plan aortogram with right leg arteriogram today for tissue loss with Dr. Myra Gianotti.  Risk benefits discussed.  Please keep NPO.  Cephus Shelling 02/01/2021 7:42 AM --

## 2021-02-01 NOTE — Progress Notes (Signed)
Pt received from cath lab. VSS. L groin level 0. R foot pulses dopplered. Pt oriented to room and unit, call light in reach.  Versie Starks, RN

## 2021-02-01 NOTE — Op Note (Signed)
Patient name: Richard [name] MRN: [id number] DOB: [birth year] Sex: male  02/01/2021 Pre-operative Diagnosis: Right foot ulcer Post-operative diagnosis:  Same Surgeon:  [name] Procedure Performed:  1.  Ultrasound-guided access, left femoral artery  2.  Abdominal aortogram  3.  Right lower extremity runoff  4.  Angioplasty, right posterior tibial and tibioperoneal trunk  5.  Conscious sedation, 67 minutes  6.  Closure device, Celt    Indications: The patient recently underwent left leg amputation and also has a wound on his right foot.  He comes in today for further evaluation  Procedure:  The patient was identified in the holding area and taken to room 8.  The patient was then placed supine on the table and prepped and draped in the usual sterile fashion.  A time out was called.  Conscious sedation was administered with the use of IV fentanyl and Versed under continuous physician and nurse monitoring.  Heart rate, blood pressure, and oxygen saturation were continuously monitored.  Total sedation time was 67 minutes.  Ultrasound was used to evaluate the left common femoral artery.  It was patent .  A digital ultrasound image was acquired.  A micropuncture needle was used to access the left common femoral artery under ultrasound guidance.  An 018 wire was advanced without resistance and a micropuncture sheath was placed.  The 018 wire was removed and a benson wire was placed.  The micropuncture sheath was exchanged for a 5 french sheath.  An omniflush catheter was advanced over the wire to the level of L-1.  An abdominal angiogram was obtained.  Next, using the omniflush catheter and a benson wire, the aortic bifurcation was crossed and the catheter was placed into theright external iliac artery and right runoff was obtained.    Findings:   Aortogram: No significant renal artery stenosis was identified.  The infrarenal abdominal aorta is widely patent without stenosis.  Bilateral common and  external iliac arteries are widely patent without significant stenosis.  Right Lower Extremity: The right common femoral and profundofemoral artery are widely patent.  The right superficial femoral artery is widely patent.  The popliteal artery is widely patent.  There is occlusion of the tibioperoneal trunk with reconstitution of the posterior tibial artery which is the dominant runoff.    Intervention: After the above images were acquired the decision made to proceed with intervention.  A 6 French 55 cm Ansell 1 sheath was advanced over the bifurcation into the right common femoral artery.  I then used a V-18 wire with a 3 x 100 Sterling balloon as a support and was able to cross the occlusion and reenter in the posterior tibial artery which was confirmed with the catheter tip in the posterior tibial artery.  I then performed primary balloon angioplasty of the posterior tibial artery and tibioperoneal trunk with a 3 mm balloon for 2 minutes.  Completion imaging revealed resolution of the stenosis.  The patient now has inline flow through the posterior tibial artery to the foot.  A short 6 French sheath was placed and a Celt device was used for closure.  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  The posterior tibial artery reconstitutes and is the dominant runoff vessel  #3  Successful crossing of the tibioperoneal trunk/posterior tibial artery occlusion and subsequent balloon angioplasty with no residual stenosis    V. [name], M.D., FACS  [facility] Office: [phone] Pager:  [phone]

## 2021-02-01 NOTE — Progress Notes (Signed)
Occupational Therapy Session Note  Patient Details  Name: Richard [name] MRN: [id number] Date of Birth: 12-01-1960  Today's Date: 02/01/2021 OT Missed Time: 60 Minutes Missed Time Reason: Unavailable (comment) (pt off unit for procedure and unable to attend group)   Short Term Goals: Week 1:  OT Short Term Goal 1 (Week 1): Pt will complete toilet or BSC transfer using LRAD and Min A OT Short Term Goal 2 (Week 1): Pt will maintain dynamic standing balance during LB ADL with no more than Min A during 2 consecutive sessions OT Short Term Goal 3 (Week 1): Pt will state 2 strategies to impement at home during daily routine for diabetes management (i.e. diet, daily limb inspection, etc)   Skilled Therapeutic Interventions/Progress Updates:    Pt unavailable at time of scheduled OT group session, missed 60 mins of group.  Therapy Documentation Precautions:  Precautions Precautions: Fall Precaution Comments: Lt BKA with vac, PICC line, sensation deficits in B hands + Rt foot Required Braces or Orthoses: Other Brace Other Brace: L BKA shrinker and limb protector Restrictions Weight Bearing Restrictions: Yes LLE Weight Bearing: Non weight bearing     Therapy/Group: Group Therapy  Erasmo Score 02/01/2021, 4:37 PM

## 2021-02-01 NOTE — Interval H&P Note (Signed)
History and Physical Interval Note:  02/01/2021 12:29 PM  Richard [name]  has presented today for surgery, with the diagnosis of pad.  The various methods of treatment have been discussed with the patient and family. After consideration of risks, benefits and other options for treatment, the patient has consented to  Procedure(s): ABDOMINAL AORTOGRAM W/LOWER EXTREMITY (N/A) as a surgical intervention.  The patient's history has been reviewed, patient examined, no change in status, stable for surgery.  I have reviewed the patient's chart and labs.  Questions were answered to the patient's satisfaction.     [name]

## 2021-02-01 NOTE — Progress Notes (Signed)
Occupational Therapy Session Note  Patient Details  Name: Richard [name] MRN: [id number] Date of Birth: [birth year]  Today's Date: 02/01/2021 OT Individual Time: 6222-9798 OT Individual Time Calculation (min): 55 min    Short Term Goals: Week 1:  OT Short Term Goal 1 (Week 1): Pt will complete toilet or BSC transfer using LRAD and Min A OT Short Term Goal 2 (Week 1): Pt will maintain dynamic standing balance during LB ADL with no more than Min A during 2 consecutive sessions OT Short Term Goal 3 (Week 1): Pt will state 2 strategies to impement at home during daily routine for diabetes management (i.e. diet, daily limb inspection, etc)   Skilled Therapeutic Interventions/Progress Updates:    Pt greeted at time of session sidelying in bed saying he is not feeling well, still feeling nauseous and did not eat/drink today d/t NPO status with procedure later today. Focus of session on education and discussion regarding CLOF, OT goals, discussing diabetes management for life decisions, finding motivation, and change in lifestyle when the pt does go home. Also discussed DME needs for home that he will need TTB and BSC, states his wife did take home measurement sheet to fill out to plan for DC home. Focus remainder of session on BUE there ex with 8# dowel for bicep curl, chest press, overhead press and FWD circles/rows for 1x20-25 each. Pt left bed level resting alarm on call bell in reach. Pt aware of upcoming vascular imaging being performed this afternoon, confirmed with nursing.    Therapy Documentation Precautions:  Precautions Precautions: Fall Precaution Comments: Lt BKA with vac, PICC line, sensation deficits in B hands + Rt foot Required Braces or Orthoses: Other Brace Other Brace: L BKA shrinker and limb protector Restrictions Weight Bearing Restrictions: Yes LLE Weight Bearing: Non weight bearing    Therapy/Group: Individual Therapy  Erasmo Score 02/01/2021, 7:14 AM

## 2021-02-02 ENCOUNTER — Encounter (HOSPITAL_COMMUNITY): Payer: Self-pay | Admitting: Surgery

## 2021-02-02 LAB — CBC
HCT: 40.1 % (ref 39.0–52.0)
Hemoglobin: 12.9 g/dL — ABNORMAL LOW (ref 13.0–17.0)
MCH: 28.5 pg (ref 26.0–34.0)
MCHC: 32.2 g/dL (ref 30.0–36.0)
MCV: 88.7 fL (ref 80.0–100.0)
Platelets: 277 10*3/uL (ref 150–400)
RBC: 4.52 MIL/uL (ref 4.22–5.81)
RDW: 18.2 % — ABNORMAL HIGH (ref 11.5–15.5)
WBC: 7.6 10*3/uL (ref 4.0–10.5)
nRBC: 0 % (ref 0.0–0.2)

## 2021-02-02 LAB — BASIC METABOLIC PANEL
Anion gap: 7 (ref 5–15)
BUN: 37 mg/dL — ABNORMAL HIGH (ref 6–20)
CO2: 29 mmol/L (ref 22–32)
Calcium: 8.5 mg/dL — ABNORMAL LOW (ref 8.9–10.3)
Chloride: 97 mmol/L — ABNORMAL LOW (ref 98–111)
Creatinine, Ser: 1.11 mg/dL (ref 0.61–1.24)
GFR, Estimated: >60 mL/min (ref >60)
Glucose, Bld: 177 mg/dL — ABNORMAL HIGH (ref 70–99)
Potassium: 3.9 mmol/L (ref 3.5–5.1)
Sodium: 133 mmol/L — ABNORMAL LOW (ref 135–145)

## 2021-02-02 LAB — GLUCOSE, CAPILLARY
Glucose-Capillary: 140 mg/dL — ABNORMAL HIGH (ref 70–99)
Glucose-Capillary: 99 mg/dL (ref 70–99)
Glucose-Capillary: 138 mg/dL — ABNORMAL HIGH (ref 70–99)

## 2021-02-02 MED ORDER — CHLORHEXIDINE GLUCONATE CLOTH 2 % EX PADS
6.0000 | MEDICATED_PAD | Freq: Every day | CUTANEOUS | Status: DC
  Administered 2021-02-02 – 2021-02-06 (×5): 6 via TOPICAL

## 2021-02-02 MED ORDER — CLOPIDOGREL BISULFATE 75 MG PO TABS: 75.0000 mg | ORAL_TABLET | Freq: Every day | ORAL | 2 refills | Status: DC

## 2021-02-02 NOTE — Discharge Instructions (Signed)
° °  [facility]  ° °Discharge Instructions ° °Lower Extremity Angiogram; Angioplasty/Stenting ° °Please refer to the following instructions for your post-procedure care. Your surgeon or physician assistant will discuss any changes with you. ° °Activity ° °Avoid lifting more than 8 pounds (1 gallons of milk) for 72 hours (3 days) after your procedure. You may walk as much as you can tolerate. It's OK to drive after 72 hours. ° °Bathing/Showering ° °You may shower the day after your procedure. If you have a bandage, you may remove it at 24- 48 hours. Clean your incision site with mild soap and water. Pat the area dry with a clean towel. ° °Diet ° °Resume your pre-procedure diet. There are no special food restrictions following this procedure. All patients with peripheral vascular disease should follow a low fat/low cholesterol diet. In order to heal from your surgery, it is CRITICAL to get adequate nutrition. Your body requires vitamins, minerals, and protein. Vegetables are the best source of vitamins and minerals. Vegetables also provide the perfect balance of protein. Processed food has little nutritional value, so try to avoid this. ° °Medications ° °Resume taking all of your medications unless your doctor tells you not to. If your incision is causing pain, you may take over-the-counter pain relievers such as acetaminophen (Tylenol) ° °Follow Up ° °Follow up will be arranged at the time of your procedure. You may have an office visit scheduled or may be scheduled for surgery. Ask your surgeon if you have any questions. ° °Please call us immediately for any of the following conditions: °•Severe or worsening pain your legs or feet at rest or with walking. °•Increased pain, redness, drainage at your groin puncture site. °•Fever of 101 degrees or higher. °•If you have any mild or slow bleeding from your puncture site: lie down, apply firm constant pressure over the area with a piece of  gauze or a clean wash cloth for 30 minutes- no peeking!, call 911 right away if you are still bleeding after 30 minutes, or if the bleeding is heavy and unmanageable. ° °Reduce your risk factors of vascular disease: ° °Stop smoking. If you would like help call [facility] at 1-800-QUIT-NOW (1-800-784-8669) or [place] at 336-586-4000. °Manage your cholesterol °Maintain a desired weight °Control your diabetes °Keep your blood pressure down ° °If you have any questions, please call the office at [phone] ° °

## 2021-02-02 NOTE — Progress Notes (Addendum)
  Progress Note    02/02/2021 8:17 AM 1 Day Post-Op  Subjective:  Wants to go back to CIR   Vitals:   02/02/21 0000 02/02/21 0335  BP:  104/75  Pulse:  73  Resp: 20 20  Temp:  98.1 F (36.7 C)  SpO2:  97%   Physical Exam: Lungs:  non labored Incisions:  L groin cath site without hematoma Extremities:  brisk R PT by doppler; dressing of foot left in place Neurologic: A&O  CBC    Component Value Date/Time   WBC 7.6 02/02/2021 0500   RBC 4.52 02/02/2021 0500   HGB 12.9 (L) 02/02/2021 0500   HCT 40.1 02/02/2021 0500   PLT 277 02/02/2021 0500   MCV 88.7 02/02/2021 0500   MCH 28.5 02/02/2021 0500   MCHC 32.2 02/02/2021 0500   RDW 18.2 (H) 02/02/2021 0500   LYMPHSABS 1.8 01/28/2021 0411   MONOABS 1.3 (H) 01/28/2021 0411   EOSABS 0.1 01/28/2021 0411   BASOSABS 0.0 01/28/2021 0411    BMET    Component Value Date/Time   NA 133 (L) 02/02/2021 0500   K 3.9 02/02/2021 0500   CL 97 (L) 02/02/2021 0500   CO2 29 02/02/2021 0500   GLUCOSE 177 (H) 02/02/2021 0500   BUN 37 (H) 02/02/2021 0500   CREATININE 1.11 02/02/2021 0500   CALCIUM 8.5 (L) 02/02/2021 0500   GFRNONAA >60 02/02/2021 0500   GFRAA >60 01/02/2020 1516    INR    Component Value Date/Time   INR 1.12 11/01/2017 0242     Intake/Output Summary (Last 24 hours) at 02/02/2021 0817 Last data filed at 02/02/2021 0457 Gross per 24 hour  Intake 240 ml  Output 1526 ml  Net -1286 ml     Assessment/Plan:  60 y.o. male is s/p balloon angio of tp trunk and PTA 1 Day Post-Op   Brisk R PT signal after the above procedure L groin cath site without hematoma Continue aspirin and plavix Ok for discharge back to CIR   Emilie Rutter, PA-C [facility] [phone] 02/02/2021 8:17 AM

## 2021-02-02 NOTE — Discharge Summary (Addendum)
Discharge Summary  Patient ID: Richard [name] [id number] 59 y.o. 05-05-1961  Admit date: 02/01/2021  Discharge date and time: 02/07/21  Admitting Physician: Emilie Rutter, PA-C   Discharge Physician: same  Admission Diagnoses: PAD (peripheral artery disease) [facility]) [I73.9]  Discharge Diagnoses: same  Admission Condition: fair  Discharged Condition: fair  Indication for Admission: Observation overnight after angiography with balloon angioplasty  Hospital Course: [name] is a 60 year old male who was admitted to inpatient rehabilitation after left below the knee amputation by Dr. Lajoyce Corners.  He was noted to have extensive tissue loss of right foot and was indicated for arteriogram.  He underwent arteriogram with balloon angioplasty of right TP trunk and PT and a by Dr. Myra Gianotti on 02/01/2021.  He tolerated procedure well and was kept in observation overnight.  POD #1 he has a brisk Doppler signal in the posterior tibial artery at the level of the ankle.  The groin catheterization site on the left side is without hematoma.  He is stable for discharge back to inpatient rehabilitation.  We will follow-up in 5 to 6 weeks with a right lower extremity arterial duplex and ABIs.  He will need to be on dual antiplatelet therapy and this will be reflected in his after visit summary.   He will be discharged in stable condition back to CIR.  Consults: None  Treatments: surgery: Right lower extremity arteriogram with balloon angioplasty of TP trunk and PTA by Dr. Myra Gianotti on 02/01/2021  Discharge Exam: See progress note 02/02/21 Vitals:   02/07/21 0333 02/07/21 0800  BP: 102/60 110/86  Pulse: 94   Resp: 19 20  Temp: 98.7 F (37.1 C) 98.2 F (36.8 C)  SpO2: 93% 98%     Disposition: Discharge disposition: [facility]-Another [facility] Care Institution Not Defined      Patient Instructions:  Allergies as of 02/07/2021       Reactions   Aspirin Itching   Codeine Hives   Coconut Oil Itching         Medication List     TAKE these medications    acetaminophen 650 MG CR tablet Commonly known as: TYLENOL Take 650 mg by mouth every 8 (eight) hours as needed for pain.   albuterol 108 (90 Base) MCG/ACT inhaler Commonly known as: VENTOLIN HFA Inhale 1-2 puffs into the lungs every 4 (four) hours as needed for wheezing or shortness of breath.   allopurinol 100 MG tablet Commonly known as: ZYLOPRIM Take 1 tablet (100 mg total) by mouth daily.   aspirin 81 MG chewable tablet Chew 81 mg by mouth daily.   atorvastatin 40 MG tablet Commonly known as: LIPITOR TAKE ONE TABLET BY MOUTH DAILY AT 6PM   bisoprolol 5 MG tablet Commonly known as: ZEBETA Take 2.5 mg by mouth daily.   Breo Ellipta 100-25 MCG/INH Aepb Generic drug: fluticasone furoate-vilanterol Inhale 1 puff into the lungs daily.   busPIRone 5 MG tablet Commonly known as: BUSPAR Take 5 mg by mouth 2 (two) times daily.   clopidogrel 75 MG tablet Commonly known as: PLAVIX Take 1 tablet (75 mg total) by mouth daily.   DULoxetine 60 MG capsule Commonly known as: CYMBALTA Take 60 mg by mouth daily.   fenofibrate 145 MG tablet Commonly known as: TRICOR TAKE ONE TABLET BY MOUTH DAILY   HYDROcodone-acetaminophen 5-325 MG tablet Commonly known as: NORCO/VICODIN Take 1-2 tablets by mouth every 6 (six) hours as needed for moderate pain (pain score 4-6).   Insulin Pen Needle 30G X 8 MM  Misc Commonly known as: NOVOFINE Inject 10 each into the skin as needed.   insulin regular human CONCENTRATED 500 UNIT/ML KwikPen Commonly known as: HUMULIN R Inject 0-400 Units into the skin as directed. Sliding   isosorbide mononitrate 30 MG 24 hr tablet Commonly known as: IMDUR Take 1 tablet (30 mg total) by mouth daily.   Jardiance 25 MG Tabs tablet Generic drug: empagliflozin Take 25 mg by mouth daily.   MAGnesium-Oxide 400 (241.3 Mg) MG tablet Generic drug: magnesium oxide Take 1 tablet (400 mg total) by mouth  daily.   multivitamin with minerals Tabs tablet Take 1 tablet by mouth daily.   nutrition supplement (JUVEN) Pack Take 1 packet by mouth 2 (two) times daily between meals.   polyethylene glycol 17 g packet Commonly known as: MIRALAX / GLYCOLAX Take 17 g by mouth daily as needed for mild constipation.   pregabalin 150 MG capsule Commonly known as: LYRICA Take 150 mg by mouth 3 (three) times daily.   sacubitril-valsartan 24-26 MG Commonly known as: ENTRESTO Take 1 tablet by mouth 2 (two) times daily.   spironolactone 25 MG tablet Commonly known as: ALDACTONE Take 0.5 tablets (12.5 mg total) by mouth daily.   Torsemide 40 MG Tabs Take 40 mg by mouth daily.   zinc sulfate 220 (50 Zn) MG capsule Take 1 capsule (220 mg total) by mouth daily.        Activity: activity as tolerated Diet: regular diet Wound Care:  Defer wound care to Dr. Audrie Lia team  Follow-up with [facility] in 5 weeks.  Signed: Mosetta Pigeon, PA-C 02/07/2021 10:08 AM [facility] Office: [phone]   I agree with the above.  I have seen and evaluated the patient.  He has improved perfusion ot the right leg. He can return to CIR today  [name]

## 2021-02-02 NOTE — Progress Notes (Signed)
Patient is postop day 1 status post revascularization to his right lower extremity.  Also almost 2 weeks status post left below-knee amputation   Patient is sitting up in bed.  Wound VAC is still in place and is clotted with dark blood in the canister.  Wound VAC was removed by myself today.  He has well-placed wound edges minimal bleeding no cellulitis Patient to be evaluated by Dr. Lajoyce Corners to see if any further goal surgical intervention is needed on his foot at this time.  He will be back tomorrow.

## 2021-02-02 NOTE — Progress Notes (Signed)
Inpatient Rehab Admissions Coordinator:   I am following for potential re-admit back to CIR. He is cleared from Vascular surgery's perspective, but Dr. Lajoyce Corners with orthopedics is to see pt. Tomorrow to determine any further surgical needs. I will continue to follow for potential re-admit once Pt. Is medically ready.   Megan Salon, MS, CCC-SLP Rehab Admissions Coordinator  [phone] (celll) [phone] (office)

## 2021-02-02 NOTE — Patient Care Conference (Signed)
Inpatient RehabilitationTeam Conference and Plan of Care Update Date: 02/01/2021   Time: 11:39 AM    Patient Name: Richard [name]      Medical Record Number: [id number]  Date of Birth: [birth year] Sex: Male         Room/Bed: 4M12C/4M12C-01 Payor Info: Payor: MEDICARE / Plan: MEDICARE PART A AND B / Product Type: *No Product type* /    Admit Date/Time:  01/27/2021  5:24 PM  Primary Diagnosis:  Below-knee amputation of left lower extremity [facility])  Hospital Problems: Principal Problem:   Below-knee amputation of left lower extremity [facility])    Expected Discharge Date: Expected Discharge Date: 02/01/21  Team Members Present: Physician leading conference: Dr. Genice Rouge Social Worker Present: Dossie Der, LCSW Nurse Present: Kennyth Arnold, RN PT Present: Casimiro Needle, PT OT Present: Earleen Newport, OT PPS Coordinator present : Fae Pippin, SLP     Current Status/Progress Goal Weekly Team Focus  Bowel/Bladder   continent B/B  remain continent      Swallow/Nutrition/ Hydration             ADL's   stand pivot transfers Min A, did walk to bathroom prior to change in orders, Mod/Max LB dress/bathe  Supervision  pending vascular results, sit <> stands for transfers, ADL transfers, ADL retraining, skin inspection, DM education, limb loss education   Mobility   CGA stand pivot, STS CGA with RW, w/c mobility x 200 ft, limited by RLE WB precaution, nausea, BP issues  supervision overall  Functional mobility, LE strength   Communication             Safety/Cognition/ Behavioral Observations            Pain   8/10 pain reported  < 3  assess pain q 4hr and prn   Skin   wound vac removed 01/31/2021.  no new skin breakdown  assess skin q shift and prn     Discharge Planning:  HOme with [name] and family members checking on him and will be there some but he also will be alone some   Team Discussion: Needs Arteriogram for right foot, orthostatic, CBG's controlled, pain controlled.  Continent B/B, education on wound care. Hasn't completed ADL's with OT. SP transfers with min assist. Supervision goals. Currently mod/max assist. PT goals contact guard/supervision assist.  Patient on target to meet rehab goals: Transferring to acute for Arteriogram.  *See Care Plan and progress notes for long and short-term goals.   Revisions to Treatment Plan:  Discharging to acute. Teaching Needs: Discharging to acute.  Current Barriers to Discharge: Decreased caregiver support, Medical stability, Home enviroment access/layout, Wound care, Lack of/limited family support, Weight, Weight bearing restrictions, and Medication compliance  Possible Resolutions to Barriers: Discharged to acute.     Medical Summary Current Status: L BKA with gangrene on RLE; LBM yesterday- VAC off yesterday- continent B/B- pain is controlled- BG's controlled- wound care is main issue-  Barriers to Discharge: Behavior;Decreased family/caregiver support;Home enviroment access/layout;Weight;Weight bearing restrictions;Wound care  Barriers to Discharge Comments: needs to be Mod I- doesn't live there/GF so will need mod I;  hasn't done ADL yet; Possible Resolutions to Barriers/Weekly Focus: going to acute for arteriogram- can come back afterwards d/c today   Continued Need for Acute Rehabilitation Level of Care: The patient requires daily medical management by a physician with specialized training in physical medicine and rehabilitation for the following reasons: Direction of a multidisciplinary physical rehabilitation program to maximize functional independence : Yes Medical management of  patient stability for increased activity during participation in an intensive rehabilitation regime.: Yes Analysis of laboratory values and/or radiology reports with any subsequent need for medication adjustment and/or medical intervention. : Yes   I attest that I was present, lead the team conference, and concur with the  assessment and plan of the team.   Tennis Must 02/02/2021, 4:28 PM

## 2021-02-03 ENCOUNTER — Other Ambulatory Visit: Payer: Self-pay | Admitting: Physician Assistant

## 2021-02-03 DIAGNOSIS — I739 Peripheral vascular disease, unspecified: Secondary | ICD-10-CM | POA: Diagnosis not present

## 2021-02-03 DIAGNOSIS — Z91018 Allergy to other foods: Secondary | ICD-10-CM | POA: Diagnosis not present

## 2021-02-03 DIAGNOSIS — E1122 Type 2 diabetes mellitus with diabetic chronic kidney disease: Secondary | ICD-10-CM | POA: Diagnosis present

## 2021-02-03 DIAGNOSIS — Z9981 Dependence on supplemental oxygen: Secondary | ICD-10-CM | POA: Diagnosis not present

## 2021-02-03 DIAGNOSIS — F419 Anxiety disorder, unspecified: Secondary | ICD-10-CM | POA: Diagnosis present

## 2021-02-03 DIAGNOSIS — K219 Gastro-esophageal reflux disease without esophagitis: Secondary | ICD-10-CM | POA: Diagnosis present

## 2021-02-03 DIAGNOSIS — I70201 Unspecified atherosclerosis of native arteries of extremities, right leg: Secondary | ICD-10-CM | POA: Diagnosis present

## 2021-02-03 DIAGNOSIS — L03115 Cellulitis of right lower limb: Secondary | ICD-10-CM | POA: Diagnosis present

## 2021-02-03 DIAGNOSIS — I70234 Atherosclerosis of native arteries of right leg with ulceration of heel and midfoot: Secondary | ICD-10-CM | POA: Diagnosis not present

## 2021-02-03 DIAGNOSIS — L97519 Non-pressure chronic ulcer of other part of right foot with unspecified severity: Secondary | ICD-10-CM | POA: Diagnosis present

## 2021-02-03 DIAGNOSIS — M868X7 Other osteomyelitis, ankle and foot: Secondary | ICD-10-CM | POA: Diagnosis present

## 2021-02-03 DIAGNOSIS — E7800 Pure hypercholesterolemia, unspecified: Secondary | ICD-10-CM | POA: Diagnosis present

## 2021-02-03 DIAGNOSIS — E1169 Type 2 diabetes mellitus with other specified complication: Secondary | ICD-10-CM | POA: Diagnosis present

## 2021-02-03 DIAGNOSIS — J449 Chronic obstructive pulmonary disease, unspecified: Secondary | ICD-10-CM | POA: Diagnosis present

## 2021-02-03 DIAGNOSIS — M109 Gout, unspecified: Secondary | ICD-10-CM | POA: Diagnosis present

## 2021-02-03 DIAGNOSIS — Z95810 Presence of automatic (implantable) cardiac defibrillator: Secondary | ICD-10-CM | POA: Diagnosis not present

## 2021-02-03 DIAGNOSIS — E1152 Type 2 diabetes mellitus with diabetic peripheral angiopathy with gangrene: Secondary | ICD-10-CM | POA: Diagnosis present

## 2021-02-03 DIAGNOSIS — I130 Hypertensive heart and chronic kidney disease with heart failure and stage 1 through stage 4 chronic kidney disease, or unspecified chronic kidney disease: Secondary | ICD-10-CM | POA: Diagnosis present

## 2021-02-03 DIAGNOSIS — Z886 Allergy status to analgesic agent status: Secondary | ICD-10-CM | POA: Diagnosis not present

## 2021-02-03 DIAGNOSIS — I2510 Atherosclerotic heart disease of native coronary artery without angina pectoris: Secondary | ICD-10-CM | POA: Diagnosis present

## 2021-02-03 DIAGNOSIS — I5022 Chronic systolic (congestive) heart failure: Secondary | ICD-10-CM | POA: Diagnosis present

## 2021-02-03 DIAGNOSIS — Z89512 Acquired absence of left leg below knee: Secondary | ICD-10-CM | POA: Diagnosis not present

## 2021-02-03 DIAGNOSIS — E11621 Type 2 diabetes mellitus with foot ulcer: Secondary | ICD-10-CM | POA: Diagnosis present

## 2021-02-03 DIAGNOSIS — L02611 Cutaneous abscess of right foot: Secondary | ICD-10-CM | POA: Diagnosis present

## 2021-02-03 DIAGNOSIS — Z885 Allergy status to narcotic agent status: Secondary | ICD-10-CM | POA: Diagnosis not present

## 2021-02-03 DIAGNOSIS — N189 Chronic kidney disease, unspecified: Secondary | ICD-10-CM | POA: Diagnosis present

## 2021-02-03 DIAGNOSIS — E1140 Type 2 diabetes mellitus with diabetic neuropathy, unspecified: Secondary | ICD-10-CM | POA: Diagnosis present

## 2021-02-03 LAB — GLUCOSE, CAPILLARY
Glucose-Capillary: 284 mg/dL — ABNORMAL HIGH (ref 70–99)
Glucose-Capillary: 183 mg/dL — ABNORMAL HIGH (ref 70–99)
Glucose-Capillary: 190 mg/dL — ABNORMAL HIGH (ref 70–99)
Glucose-Capillary: 204 mg/dL — ABNORMAL HIGH (ref 70–99)

## 2021-02-03 NOTE — Progress Notes (Addendum)
  Progress Note    02/03/2021 7:34 AM 2 Days Post-Op  Subjective:  no new concerns   Vitals:   02/02/21 2330 02/03/21 0437  BP: 111/80 (!) 147/129  Pulse: 75 76  Resp: 19 18  Temp: 98.2 F (36.8 C) (!) 97.5 F (36.4 C)  SpO2: 93% 95%   Physical Exam: Lungs:  non labored Incisions:  L groin without hematoma Extremities:  R PT brisk by doppler Neurologic: A&O  CBC    Component Value Date/Time   WBC 7.6 02/02/2021 0500   RBC 4.52 02/02/2021 0500   HGB 12.9 (L) 02/02/2021 0500   HCT 40.1 02/02/2021 0500   PLT 277 02/02/2021 0500   MCV 88.7 02/02/2021 0500   MCH 28.5 02/02/2021 0500   MCHC 32.2 02/02/2021 0500   RDW 18.2 (H) 02/02/2021 0500   LYMPHSABS 1.8 01/28/2021 0411   MONOABS 1.3 (H) 01/28/2021 0411   EOSABS 0.1 01/28/2021 0411   BASOSABS 0.0 01/28/2021 0411    BMET    Component Value Date/Time   NA 133 (L) 02/02/2021 0500   K 3.9 02/02/2021 0500   CL 97 (L) 02/02/2021 0500   CO2 29 02/02/2021 0500   GLUCOSE 177 (H) 02/02/2021 0500   BUN 37 (H) 02/02/2021 0500   CREATININE 1.11 02/02/2021 0500   CALCIUM 8.5 (L) 02/02/2021 0500   GFRNONAA >60 02/02/2021 0500   GFRAA >60 01/02/2020 1516    INR    Component Value Date/Time   INR 1.12 11/01/2017 0242     Intake/Output Summary (Last 24 hours) at 02/03/2021 0734 Last data filed at 02/03/2021 0438 Gross per 24 hour  Intake 380 ml  Output 2700 ml  Net -2320 ml     Assessment/Plan:  60 y.o. male is s/p R tp trunk balloon angio 2 Days Post-Op   RLE perfusion optimized after recent intervention Dr. Lajoyce Corners to evaluate R foot wounds today D/c back to CIR pending the above   Emilie Rutter, PA-C [facility] [phone] 02/03/2021 7:34 AM

## 2021-02-03 NOTE — Progress Notes (Signed)
Inpatient Diabetes Program Recommendations  AACE/ADA: New Consensus Statement on Inpatient Glycemic Control (2015)  Target Ranges:  Prepandial:   less than 140 mg/dL      Peak postprandial:   less than 180 mg/dL (1-2 hours)      Critically ill patients:  140 - 180 mg/dL   Lab Results  Component Value Date   GLUCAP 284 (H) 02/03/2021   HGBA1C 10.3 (H) 01/17/2021    Review of Glycemic Control Results for ABOU, STERKEL (MRN [id number]) as of 02/03/2021 10:11  Ref. Range 02/02/2021 06:25 02/02/2021 12:29 02/02/2021 15:42 02/03/2021 08:44  Glucose-Capillary Latest Ref Range: 70 - 99 mg/dL 765 (H) 99 465 (H) 035 (H)   Diabetes history: DM 2 Outpatient Diabetes medications: Humulin R U-500 concentrated insulin 0-400 units per sliding scale tid, Jardiance 25 mg Daily Current orders for Inpatient glycemic control:  Semglee 13 units Novolog 0-15 units tid + hs Novolog 4 units tid meal coverage Jardiance 10 mg Daily  Renal function WNL A1c 10.3% on 7/25  Inpatient Diabetes Program Recommendations:    Note Novolog meal coverage not being given consistently, fasting glucose elevated this am 284.  - reduce Novolog meal coverage to 2 units tid if eating >50% of meals  May need to increase long acting insulin tomorrow if fasting glucose is elevated above 200.  Thanks,  Christena Deem RN, MSN, BC-ADM Inpatient Diabetes Coordinator Team Pager [phone] (8a-5p)

## 2021-02-03 NOTE — H&P (View-Only) (Signed)
Patient ID: Richard [name], male   DOB: 04/24/1961, 60 y.o.   MRN: [id number] Patient is status post a left below the knee amputation.  Patient has had progressive necrotic changes to the right forefoot and underwent revascularization to the right lower extremity.  Patient still has a large necrotic ulcer involving the first metatarsal head with cellulitis abscess that extends to the second toe.  Have recommended with the patient to proceed with a right first and second ray amputation tomorrow.  Risks and benefits were discussed including risk of the wound not healing need for additional surgery.  Patient states he understands wished to proceed at this time.

## 2021-02-03 NOTE — Progress Notes (Signed)
Patient ID: Richard [name], male   DOB: 05/12/1961, 59 y.o.   MRN: [id number] Patient is status post a left below the knee amputation.  Patient has had progressive necrotic changes to the right forefoot and underwent revascularization to the right lower extremity.  Patient still has a large necrotic ulcer involving the first metatarsal head with cellulitis abscess that extends to the second toe.  Have recommended with the patient to proceed with a right first and second ray amputation tomorrow.  Risks and benefits were discussed including risk of the wound not healing need for additional surgery.  Patient states he understands wished to proceed at this time. 

## 2021-02-04 ENCOUNTER — Inpatient Hospital Stay (HOSPITAL_COMMUNITY): Payer: Medicare Other | Admitting: Anesthesiology

## 2021-02-04 ENCOUNTER — Encounter (HOSPITAL_COMMUNITY): Admission: RE | Disposition: A | Discharge status: Rehabilitation Facility | Payer: Self-pay | Attending: Surgery

## 2021-02-04 ENCOUNTER — Encounter (HOSPITAL_COMMUNITY): Payer: Self-pay | Admitting: Physician Assistant

## 2021-02-04 DIAGNOSIS — E1152 Type 2 diabetes mellitus with diabetic peripheral angiopathy with gangrene: Secondary | ICD-10-CM | POA: Diagnosis not present

## 2021-02-04 DIAGNOSIS — I739 Peripheral vascular disease, unspecified: Secondary | ICD-10-CM

## 2021-02-04 HISTORY — PX: AMPUTATION: SHX166

## 2021-02-04 HISTORY — PX: APPLICATION OF WOUND VAC: SHX5189

## 2021-02-04 LAB — GLUCOSE, CAPILLARY
Glucose-Capillary: 177 mg/dL — ABNORMAL HIGH (ref 70–99)
Glucose-Capillary: 71 mg/dL (ref 70–99)
Glucose-Capillary: 93 mg/dL (ref 70–99)
Glucose-Capillary: 94 mg/dL (ref 70–99)
Glucose-Capillary: 193 mg/dL — ABNORMAL HIGH (ref 70–99)
Glucose-Capillary: 227 mg/dL — ABNORMAL HIGH (ref 70–99)

## 2021-02-04 SURGERY — AMPUTATION, FOOT, RAY
Anesthesia: Monitor Anesthesia Care | Laterality: Right

## 2021-02-04 MED ORDER — CEFAZOLIN SODIUM-DEXTROSE 2-4 GM/100ML-% IV SOLN
INTRAVENOUS | Status: AC
  Filled 2021-02-04: qty 100

## 2021-02-04 MED ORDER — CHLORHEXIDINE GLUCONATE 0.12 % MT SOLN
15.0000 mL | Freq: Once | OROMUCOSAL | Status: AC
  Administered 2021-02-04: 15 mL via OROMUCOSAL
  Filled 2021-02-04: qty 15

## 2021-02-04 MED ORDER — FENTANYL CITRATE (PF) 250 MCG/5ML IJ SOLN
INTRAMUSCULAR | Status: AC
  Filled 2021-02-04: qty 5

## 2021-02-04 MED ORDER — GUAIFENESIN-DM 100-10 MG/5ML PO SYRP: 15.0000 mL | ORAL_SOLUTION | ORAL | Status: DC | PRN

## 2021-02-04 MED ORDER — LACTATED RINGERS IV SOLN: INTRAVENOUS | Status: DC

## 2021-02-04 MED ORDER — FENTANYL CITRATE (PF) 100 MCG/2ML IJ SOLN: 50.0000 ug | Freq: Once | INTRAMUSCULAR | Status: AC

## 2021-02-04 MED ORDER — 0.9 % SODIUM CHLORIDE (POUR BTL) OPTIME
TOPICAL | Status: DC | PRN
  Administered 2021-02-04: 1000 mL

## 2021-02-04 MED ORDER — BUPIVACAINE HCL (PF) 0.5 % IJ SOLN
INTRAMUSCULAR | Status: DC | PRN
  Administered 2021-02-04: 26 mL via PERINEURAL

## 2021-02-04 MED ORDER — FENTANYL CITRATE (PF) 100 MCG/2ML IJ SOLN: 25.0000 ug | INTRAMUSCULAR | Status: DC | PRN

## 2021-02-04 MED ORDER — ONDANSETRON HCL 4 MG/2ML IJ SOLN
INTRAMUSCULAR | Status: DC | PRN
  Administered 2021-02-04: 4 mg via INTRAVENOUS

## 2021-02-04 MED ORDER — CEFAZOLIN SODIUM-DEXTROSE 2-4 GM/100ML-% IV SOLN
2.0000 g | INTRAVENOUS | Status: AC
  Administered 2021-02-04: 2 g via INTRAVENOUS

## 2021-02-04 MED ORDER — CEFAZOLIN SODIUM-DEXTROSE 2-4 GM/100ML-% IV SOLN
2.0000 g | Freq: Three times a day (TID) | INTRAVENOUS | Status: AC
  Administered 2021-02-05 (×2): 2 g via INTRAVENOUS
  Filled 2021-02-04 (×2): qty 100

## 2021-02-04 MED ORDER — PHENOL 1.4 % MT LIQD: 1.0000 | OROMUCOSAL | Status: DC | PRN

## 2021-02-04 MED ORDER — ORAL CARE MOUTH RINSE: 15.0000 mL | Freq: Once | OROMUCOSAL | Status: AC

## 2021-02-04 MED ORDER — DOCUSATE SODIUM 100 MG PO CAPS
100.0000 mg | ORAL_CAPSULE | Freq: Every day | ORAL | Status: DC
  Administered 2021-02-05 – 2021-02-07 (×2): 100 mg via ORAL
  Filled 2021-02-04 (×3): qty 1

## 2021-02-04 MED ORDER — PROPOFOL 1000 MG/100ML IV EMUL
INTRAVENOUS | Status: AC
  Filled 2021-02-04: qty 100

## 2021-02-04 MED ORDER — PROPOFOL 500 MG/50ML IV EMUL
INTRAVENOUS | Status: DC | PRN
  Administered 2021-02-04: 75 ug/kg/min via INTRAVENOUS

## 2021-02-04 MED ORDER — ACETAMINOPHEN 500 MG PO TABS
ORAL_TABLET | ORAL | Status: AC
  Administered 2021-02-04: 1000 mg via ORAL
  Filled 2021-02-04: qty 2

## 2021-02-04 MED ORDER — SODIUM CHLORIDE 0.9 % IV SOLN: INTRAVENOUS | Status: DC

## 2021-02-04 MED ORDER — OXYCODONE HCL 5 MG PO TABS: 5.0000 mg | ORAL_TABLET | ORAL | Status: DC | PRN

## 2021-02-04 MED ORDER — MIDAZOLAM HCL 2 MG/2ML IJ SOLN: 2.0000 mg | Freq: Once | INTRAMUSCULAR | Status: AC

## 2021-02-04 MED ORDER — ACETAMINOPHEN 500 MG PO TABS: 1000.0000 mg | ORAL_TABLET | Freq: Once | ORAL | Status: AC

## 2021-02-04 MED ORDER — HYDROMORPHONE HCL 1 MG/ML IJ SOLN: 0.5000 mg | INTRAMUSCULAR | Status: DC | PRN

## 2021-02-04 MED ORDER — ALUM & MAG HYDROXIDE-SIMETH 200-200-20 MG/5ML PO SUSP: 15.0000 mL | ORAL | Status: DC | PRN

## 2021-02-04 MED ORDER — BISACODYL 5 MG PO TBEC: 5.0000 mg | DELAYED_RELEASE_TABLET | Freq: Every day | ORAL | Status: DC | PRN

## 2021-02-04 MED ORDER — MIDAZOLAM HCL 2 MG/2ML IJ SOLN
INTRAMUSCULAR | Status: AC
  Administered 2021-02-04: 2 mg via INTRAVENOUS
  Filled 2021-02-04: qty 2

## 2021-02-04 MED ORDER — PROPOFOL 10 MG/ML IV BOLUS
INTRAVENOUS | Status: AC
  Filled 2021-02-04: qty 20

## 2021-02-04 MED ORDER — PHENYLEPHRINE HCL-NACL 20-0.9 MG/250ML-% IV SOLN
INTRAVENOUS | Status: DC | PRN
  Administered 2021-02-04: 25 ug/min via INTRAVENOUS

## 2021-02-04 MED ORDER — FENTANYL CITRATE (PF) 100 MCG/2ML IJ SOLN
INTRAMUSCULAR | Status: AC
  Administered 2021-02-04: 50 ug via INTRAVENOUS
  Filled 2021-02-04: qty 2

## 2021-02-04 SURGICAL SUPPLY — 32 items
BAG COUNTER SPONGE SURGICOUNT (BAG) ×3 IMPLANT
BAG SPNG CNTER NS LX DISP (BAG) ×2
BLADE SAW SGTL MED 73X18.5 STR (BLADE) ×1 IMPLANT
BLADE SURG 21 STRL SS (BLADE) ×3 IMPLANT
BNDG COHESIVE 4X5 TAN STRL (GAUZE/BANDAGES/DRESSINGS) ×2 IMPLANT
BNDG COHESIVE 6X5 TAN NS LF (GAUZE/BANDAGES/DRESSINGS) ×1 IMPLANT
BNDG GAUZE ELAST 4 BULKY (GAUZE/BANDAGES/DRESSINGS) ×2 IMPLANT
COVER SURGICAL LIGHT HANDLE (MISCELLANEOUS) ×5 IMPLANT
DRAPE U-SHAPE 47X51 STRL (DRAPES) ×6 IMPLANT
DRESSING PEEL AND PLC PRVNA 13 (GAUZE/BANDAGES/DRESSINGS) IMPLANT
DRSG ADAPTIC 3X8 NADH LF (GAUZE/BANDAGES/DRESSINGS) ×2 IMPLANT
DRSG PAD ABDOMINAL 8X10 ST (GAUZE/BANDAGES/DRESSINGS) ×4 IMPLANT
DRSG PEEL AND PLACE PREVENA 13 (GAUZE/BANDAGES/DRESSINGS) ×3
DURAPREP 26ML APPLICATOR (WOUND CARE) ×3 IMPLANT
ELECT REM PT RETURN 9FT ADLT (ELECTROSURGICAL) ×3
ELECTRODE REM PT RTRN 9FT ADLT (ELECTROSURGICAL) ×2 IMPLANT
GAUZE SPONGE 4X4 12PLY STRL (GAUZE/BANDAGES/DRESSINGS) ×2 IMPLANT
GLOVE SURG ORTHO LTX SZ9 (GLOVE) ×3 IMPLANT
GLOVE SURG UNDER POLY LF SZ9 (GLOVE) ×3 IMPLANT
GOWN STRL REUS W/ TWL XL LVL3 (GOWN DISPOSABLE) ×4 IMPLANT
GOWN STRL REUS W/TWL XL LVL3 (GOWN DISPOSABLE) ×6
KIT BASIN OR (CUSTOM PROCEDURE TRAY) ×3 IMPLANT
KIT PUMP PREVENA PLUS 14DAY (MISCELLANEOUS) ×1 IMPLANT
KIT TURNOVER KIT B (KITS) ×3 IMPLANT
NS IRRIG 1000ML POUR BTL (IV SOLUTION) ×3 IMPLANT
PACK ORTHO EXTREMITY (CUSTOM PROCEDURE TRAY) ×3 IMPLANT
PAD ARMBOARD 7.5X6 YLW CONV (MISCELLANEOUS) ×6 IMPLANT
STOCKINETTE IMPERVIOUS LG (DRAPES) IMPLANT
SUT ETHILON 2 0 PSLX (SUTURE) ×3 IMPLANT
TOWEL GREEN STERILE (TOWEL DISPOSABLE) ×3 IMPLANT
TUBE CONNECTING 12X1/4 (SUCTIONS) ×3 IMPLANT
YANKAUER SUCT BULB TIP NO VENT (SUCTIONS) ×3 IMPLANT

## 2021-02-04 NOTE — Anesthesia Procedure Notes (Signed)
Procedure Name: MAC Date/Time: 02/04/2021 3:42 PM Performed by: Carolan Clines, CRNA Pre-anesthesia Checklist: Patient identified, Emergency Drugs available, Suction available and Patient being monitored Patient Re-evaluated:Patient Re-evaluated prior to induction Oxygen Delivery Method: Simple face mask Dental Injury: Teeth and Oropharynx as per pre-operative assessment

## 2021-02-04 NOTE — Transfer of Care (Addendum)
Immediate Anesthesia Transfer of Care Note  Patient: Richard [name]  Procedure(s) Performed: RIGHT FOOT 1ST AND 2ND RAY AMPUTATION (Right) APPLICATION OF WOUND VAC  Patient Location: PACU  Anesthesia Type:MAC combined with regional for post-op pain  Level of Consciousness: awake, alert  and oriented  Airway & Oxygen Therapy: Patient Spontanous Breathing and Patient connected to face mask oxygen  Post-op Assessment: Report given to RN and Post -op Vital signs reviewed and stable  Post vital signs: Reviewed and stable  Last Vitals:  Vitals Value Taken Time  BP 107/66 02/04/21 1622  Temp 36.9 C 02/04/21 1620  Pulse 73 02/04/21 1620  Resp 33 02/04/21 1622  SpO2 98 % 02/04/21 1620  Vitals shown include unvalidated device data.  Last Pain:  Vitals:   02/04/21 1428  TempSrc:   PainSc: 0-No pain      Patients Stated Pain Goal: 0 (14/70/92 9574)  Complications: No notable events documented.

## 2021-02-04 NOTE — Progress Notes (Signed)
Pt. Arrived on unit post right foot toes amputations. Vitals taken upon arrival. Vitals are stable and patient is eating. Patient is awake and alert. Oriented x4. Assessment of needs, and pain performed. Will continue to monitor.  Arlyn Dunning, RN

## 2021-02-04 NOTE — Anesthesia Preprocedure Evaluation (Addendum)
Anesthesia Evaluation  Patient identified by MRN, date of birth, ID band Patient awake    Reviewed: Allergy & Precautions, NPO status , [name] Chart, lab work & pertinent test results  History of Anesthesia Complications Negative for: history of anesthetic complications  Airway Mallampati: II  TM Distance: >3 FB Neck ROM: Full    Dental no notable dental hx.    Pulmonary COPD,  oxygen dependent, former smoker,    Pulmonary exam normal        Cardiovascular hypertension, Pt. on medications + CAD, + Past MI, + Peripheral Vascular Disease and +CHF  Normal cardiovascular exam+ Cardiac Defibrillator   TTE 01/16/21: anterior,anteroseptal, anterolateral walls are akinetic, EF 20-25%, moderate to severe LVE, RV poorly visualized-appears mildly enlarged with low normal  to mildly decreased function, mild LAE   Neuro/Psych Anxiety negative neurological ROS     GI/Hepatic Neg liver ROS, GERD  Controlled,  Endo/Other  diabetes, Type 2, Oral Hypoglycemic Agents, Insulin Dependent  Renal/GU Renal InsufficiencyRenal disease  negative genitourinary   Musculoskeletal negative musculoskeletal ROS (+)   Abdominal   Peds  Hematology negative hematology ROS (+)   Anesthesia Other Findings Day of surgery medications reviewed with patient.  Reproductive/Obstetrics negative OB ROS                            Anesthesia Physical Anesthesia Plan  ASA: 4  Anesthesia Plan: Regional and MAC   Post-op Pain Management:    Induction:   PONV Risk Score and Plan: 1 and Treatment may vary due to age or medical condition, Ondansetron and Midazolam  Airway Management Planned: Natural Airway and Simple Face Mask  Additional Equipment: None  Intra-op Plan:   Post-operative Plan: Extubation in OR  Informed Consent: I have reviewed the patients History and Physical, chart, labs and discussed the procedure  including the risks, benefits and alternatives for the proposed anesthesia with the patient or authorized representative who has indicated his/her understanding and acceptance.     Dental advisory given  Plan Discussed with: CRNA  Anesthesia Plan Comments:        Anesthesia Quick Evaluation

## 2021-02-04 NOTE — Progress Notes (Signed)
Orthopedic Tech Progress Note Patient Details:  Richard [name] [birth year] [id number]  Ortho Devices Type of Ortho Device: Postop shoe/boot Ortho Device/Splint Location: Right foot Ortho Device/Splint Interventions: Application   Post Interventions Patient Tolerated: Well  Richard [name] 02/04/2021, 6:34 PM

## 2021-02-04 NOTE — Interval H&P Note (Signed)
History and Physical Interval Note:  02/04/2021 7:09 AM  Richard [name]  has presented today for surgery, with the diagnosis of Right Foot Osteomyelitis, Abscess.  The various methods of treatment have been discussed with the patient and family. After consideration of risks, benefits and other options for treatment, the patient has consented to  Procedure(s): RIGHT FOOT 1ST AND 2ND RAY AMPUTATION (Right) as a surgical intervention.  The patient's history has been reviewed, patient examined, no change in status, stable for surgery.  I have reviewed the patient's chart and labs.  Questions were answered to the patient's satisfaction.     Nadara Mustard

## 2021-02-04 NOTE — Op Note (Addendum)
02/04/2021  6:53 PM  PATIENT:  Richard [name]    PRE-OPERATIVE DIAGNOSIS:  Right Foot Osteomyelitis, Abscess  POST-OPERATIVE DIAGNOSIS:  Same  PROCEDURE:  RIGHT FOOT 1ST AND 2ND RAY AMPUTATION, APPLICATION OF WOUND VAC Local tissue rearrangement for wound closure is 13 x 5 cm.   SURGEON:  Nadara Mustard, MD  PHYSICIAN ASSISTANT:None ANESTHESIA:   General  PREOPERATIVE INDICATIONS:  Richard [name] is a  60 y.o. male with a diagnosis of Right Foot Osteomyelitis, Abscess who failed conservative measures and elected for surgical management.    The risks benefits and alternatives were discussed with the patient preoperatively including but not limited to the risks of infection, bleeding, nerve injury, cardiopulmonary complications, the need for revision surgery, among others, and the patient was willing to proceed.  OPERATIVE IMPLANTS: Praveena wound VAC 13 cm.  @ENCIMAGES @  OPERATIVE FINDINGS: Extensive soft tissue involvement with the necrosis.  OPERATIVE PROCEDURE: Patient was brought the operating room after undergoing a regional anesthetic.  After adequate levels anesthesia were obtained patient's right lower extremity was prepped using DuraPrep draped into a sterile field a timeout was called.  A racquet incision was made around the ulcerative tissue this was carried sharply down to bone the first metatarsal was resected through the base and the second metatarsal was resected using an oscillating saw through the base.  All necrotic tissue was resected the wound was irrigated normal saline electrocardio was used hemostasis.  Local tissue rearrangement was used to close the wound 13 x 5 cm.  A Praveena 13 cm wound VAC was applied this had a good suction fit patient was taken the PACU in stable condition   DISCHARGE PLANNING:  Antibiotic duration: Continue antibiotics for 24 hours  Weightbearing: Ideally nonweightbearing on the right but touchdown weightbearing on the heel would be  acceptable  Pain medication: Opioid pathway  Dressing care/ Wound VAC: Continue wound VAC for 1 week  Ambulatory devices: Walker or crutches  Discharge to: Anticipate discharge to home  Follow-up: In the office 1 week post operative.

## 2021-02-04 NOTE — Progress Notes (Signed)
Pt refused all meds this morning for fear of nausea without eating solid foods. Pt refused medications again after surgery and with food. Pt refused medications with supper because the food wasn't what they wanted. Pt made aware of their non-compliance. Pt. States they will resume medications later.  Patient is Alert and Oriented x4. Vitals are within defined limits.  Arlyn Dunning, RN

## 2021-02-04 NOTE — Progress Notes (Signed)
Pt. Refused all oral morning meds. Wants to wait until after the procedure due to fear of nausea on an empty stomach.

## 2021-02-04 NOTE — Discharge Summary (Signed)
Physician Discharge Summary  Patient ID: Richard [name] MRN: [id number] DOB/AGE: October 04, 1960 60 y.o.  Admit date: 01/27/2021 Discharge date: 02/01/2021  Discharge Diagnoses:  Principal Problem:   Below-knee amputation of left lower extremity (HCC) Active Problems:   COPD (chronic obstructive pulmonary disease) (HCC)   CKD (chronic kidney disease)   GERD (gastroesophageal reflux disease)   Diabetes mellitus with diabetic neuropathy, with long-term current use of insulin (HCC)   Chronic systolic CHF (congestive heart failure) (HCC)   Hyponatremia   S/P BKA (below knee amputation), left (HCC)   Discharged Condition: stable   Significant Diagnostic Studies: N/a   Labs:  Basic Metabolic Panel: BMP Latest Ref Rng & Units 01/28/2021  Glucose 70 - 99 mg/dL 86  BUN 6 - 20 mg/dL 88(C)  Creatinine 1.66 - 1.24 mg/dL 0.63  Sodium 016 - 010 mmol/L 132(L)  Potassium 3.5 - 5.1 mmol/L 4.5  Chloride 98 - 111 mmol/L 91(L)  CO2 22 - 32 mmol/L 33(H)  Calcium 8.9 - 10.3 mg/dL 9.3(A)      CBC: CBC Latest Ref Rng & Units 01/28/2021  WBC 4.0 - 10.5 K/uL 12.0(H)  Hemoglobin 13.0 - 17.0 g/dL 35.5  Hematocrit 73.2 - 52.0 % 41.2  Platelets 150 - 400 K/uL 352     Brief HPI:   Taichi Repka is a 60 y.o. male with history of CKD, T2DM with neuropathy, COPD-2 L oxygen dependent, chronic combined CHF, chronic BLE ulcers who was admitted on 01/15/2021 with severe sepsis due to group G bacteremia and acute on chronic respiratory failure due to acute exacerbation of CHF.  He was treated with IV diuresis, started on broad-spectrum antibiotics and CT left foot done showing concerns of necrotizing infection with possible early osteomyelitis.  He underwent left transtibial amputation on 07/29 by Dr. Lajoyce Corners and postop had issues with hiccups.    ID was following for input on work-up and TEE was negative for endocarditis. He did have rising WBC postop and CT abdomen pelvis done which was negative for acute findings  therefore antibiotics narrowed to Pen G for 2 additional days.  PCCM was consulted for incidental finding of LLL hematoma and recommended follow-up with [facility] pulmonology as mass was not new.  Patient continued to be limited by anxiety, shortness of breath with minimal activity as well as debility.  CIR was recommended due to functional decline.     Hospital Course: Renzo Vincelette was admitted to rehab 01/27/2021 for inpatient therapies to consist of PT and OT at least three hours five days a week. Past admission physiatrist, therapy team and rehab RN have worked together to provide customized collaborative inpatient rehab.  At admission patient required min to mod assist with basic self-care tasks and max assist with mobility.  Follow-up CBC showed white count to be trending downwards.  He did have issues with dizziness secondary to orthostatic hypotension and hydralazine was discontinued.  Pain was controlled on as needed basis but he continued to have issues with intermittent nausea.  Check of electrolytes showed persistent hyponatremia.  His diabetes was monitored with ac/hs CBG checks and SSI was use prn for tighter BS control.   Right foot dressing was changed on 08/05 and he was noted to have large ruptured blister with denuded area, mild erythema as well as multiple ulcers in addition to dime size stage II on right tibia.  He was made weightbearing as tolerated on RLE for transfers only.  Gabapentin was added to help manage phantom pain. Dr. Lajoyce Corners was  consulted for input and recommended vascular consult to evaluate circulation as well as imaging for work-up.  He continued to have significant drainage from his left BKA site therefore wound VAC was kept in place.  Dr. Chestine Spore evaluated patient and recommended a aortogram with RLE arteriogram due to tissue loss.  Patient was discharged to acute floor on 08/09 for further work-up.     Medications at discharge: 1.  Allopurinol 100 mg p.o. per day. 2.   Lipitor 40 mg p.o. daily 3.  Vitamin C 1000 mgrams p.o. daily 4.  Bisoprolol 2.5 mg p.o. daily 5.  BuSpar 5 mg p.o. twice daily 6.  Lovenox 40 mg subcu daily 7.  Gabapentin 100 mg p.o. 3 times daily 8.  Norco 7.5/325 1-2 tabs p.o. every 4 hours as needed severe pain 9.  Insulin glargine 13 units subcu twice daily 10.  Imdur 30 mg p.o. daily 11.  Protonix 40 mg p.o. daily 12.  Juven 1 packet p.o. twice daily 13.  Entresto 24/26 mg p.o. twice daily 14.  Senokot S2 p.o. daily 13.  Aldactone 12.5 mg p.o. daily 14.  Demadex 40 mg p.o. daily 15.  Trazodone 50 mg p.o. nightly as needed 16.  Zinc sulfate to 20 mg p.o. daily   Disposition: Acute hospital    Signed: Jacquelynn Cree 02/04/2021, 5:26 PM

## 2021-02-04 NOTE — Anesthesia Procedure Notes (Signed)
Anesthesia Regional Block: Ankle block   Pre-Anesthetic Checklist: , timeout performed,  Correct Patient, Correct Site, Correct Laterality,  Correct Procedure, Correct Position, site marked,  Risks and benefits discussed,  Pre-op evaluation,  At surgeon's request and post-op pain management  Laterality: Right  Prep: Maximum Sterile Barrier Precautions used, chloraprep       Needles:  Injection technique: Single-shot  Needle Type: Echogenic Needle     Needle Length: 4cm  Needle Gauge: 25     Additional Needles:   Narrative:  Start time: 02/04/2021 2:55 PM End time: 02/04/2021 3:00 PM  Performed by: Personally  Anesthesiologist: Kaylyn Layer, MD  Additional Notes: Risks, benefits, and alternative discussed. Patient gave consent for procedure. Patient prepped and draped in sterile fashion. Sedation administered, patient remains easily responsive to voice. Local anesthetic given in 5cc increments with no signs or symptoms of intravascular injection. No pain or paraesthesias with injection. Patient monitored throughout procedure with signs of LAST or immediate complications. Tolerated well.   Amalia Greenhouse, MD

## 2021-02-05 DIAGNOSIS — I70234 Atherosclerosis of native arteries of right leg with ulceration of heel and midfoot: Secondary | ICD-10-CM

## 2021-02-05 LAB — BASIC METABOLIC PANEL
Anion gap: 7 (ref 5–15)
BUN: 27 mg/dL — ABNORMAL HIGH (ref 6–20)
CO2: 27 mmol/L (ref 22–32)
Calcium: 8.8 mg/dL — ABNORMAL LOW (ref 8.9–10.3)
Chloride: 100 mmol/L (ref 98–111)
Creatinine, Ser: 1.06 mg/dL (ref 0.61–1.24)
GFR, Estimated: >60 mL/min (ref >60)
Glucose, Bld: 185 mg/dL — ABNORMAL HIGH (ref 70–99)
Potassium: 4.5 mmol/L (ref 3.5–5.1)
Sodium: 134 mmol/L — ABNORMAL LOW (ref 135–145)

## 2021-02-05 LAB — CBC
HCT: 42.5 % (ref 39.0–52.0)
Hemoglobin: 13.5 g/dL (ref 13.0–17.0)
MCH: 28.5 pg (ref 26.0–34.0)
MCHC: 31.8 g/dL (ref 30.0–36.0)
MCV: 89.7 fL (ref 80.0–100.0)
Platelets: 232 10*3/uL (ref 150–400)
RBC: 4.74 MIL/uL (ref 4.22–5.81)
RDW: 18.3 % — ABNORMAL HIGH (ref 11.5–15.5)
WBC: 11.6 10*3/uL — ABNORMAL HIGH (ref 4.0–10.5)
nRBC: 0 % (ref 0.0–0.2)

## 2021-02-05 LAB — GLUCOSE, CAPILLARY
Glucose-Capillary: 179 mg/dL — ABNORMAL HIGH (ref 70–99)
Glucose-Capillary: 198 mg/dL — ABNORMAL HIGH (ref 70–99)
Glucose-Capillary: 138 mg/dL — ABNORMAL HIGH (ref 70–99)
Glucose-Capillary: 183 mg/dL — ABNORMAL HIGH (ref 70–99)

## 2021-02-05 NOTE — Progress Notes (Addendum)
Notified inpatient rehab admission co-ordinator of stability to DC from acute care to re-admit to inpatient rehab.  Blood pressure somewhat soft but has not yet ambulated this morning. Discussed with attendant RN. Will hold Aldactone and Imdur this morning and monitor. Resume as usual if not symptomatic.

## 2021-02-05 NOTE — Evaluation (Signed)
Occupational Therapy Evaluation Patient Details Name: Richard [name] MRN: [id number] DOB: [birth year] Today's Date: 02/05/2021    History of Present Illness Pt is a 60 y.o. male admitted 01/15/21 with SOB, body aches. Workup for CHF vs COPD exacerbation. Pt also with chronic BLE wounds followed by wound care. 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 Of note, recent admission 09/2020 with CHF exacerbation. PMH includes DM2, COPD (on 2L O2 baseline), CHF, AICD, CKD, HTN.  L BKA two weeks prior.  1st and 2nd ray amputation R foot 8/12.   Clinical Impression   Patient admitted from CIR for 1st and 2nd ray amputation.  See MD note for WBS.  Pain to R foot is the main barrier.  He is hoping to transition back to CIR for resumption of intensive rehab.  Patient will need CIR, as he 24 hour supervision, but limited physical assist.  OT will continue to follow him in the acute setting to maximize his functional status.      Follow Up Recommendations  CIR    Equipment Recommendations  3 in 1 bedside commode;Wheelchair (measurements OT);Wheelchair cushion (measurements OT);Tub/shower bench    Recommendations for Other Services Rehab consult     Precautions / Restrictions Precautions Precautions: Fall Precaution Comments: Lt BKA.  R ray amputation with wound vac Other Brace: L BKA shrinker and limb protector.  R post op shoe. Restrictions Weight Bearing Restrictions: Yes RLE Weight Bearing: Non weight bearing Other Position/Activity Restrictions: Per MD note:  "Weightbearing: Ideally nonweightbearing on the right but touchdown weightbearing on the heel would be acceptable".      Mobility Bed Mobility Overal bed mobility: Needs Assistance Bed Mobility: Sit to Supine       Sit to supine: Supervision     Patient Response: Cooperative  Transfers Overall transfer level: Needs assistance   Transfers:  Lateral/Scoot Transfers;Sit to/from Stand Sit to Stand: Min assist        Lateral/Scoot Transfers: Min assist      Balance Overall balance assessment: Needs assistance Sitting-balance support: No upper extremity supported;Feet supported Sitting balance-Leahy Scale: Good     Standing balance support: Bilateral upper extremity supported Standing balance-Leahy Scale: Poor Standing balance comment: Reliant on BUE support and external assist                           ADL either performed or assessed with clinical judgement   ADL   Eating/Feeding: Independent;Sitting   Grooming: Wash/dry face;Wash/dry hands;Set up;Sitting   Upper Body Bathing: Set up;Sitting   Lower Body Bathing: Moderate assistance;Sitting/lateral leans;Minimal assistance   Upper Body Dressing : Sitting;Set up   Lower Body Dressing: Sitting/lateral leans;Moderate assistance                       Vision Patient Visual Report: No change from baseline;Diplopia Additional Comments: limited vision out of L eye     Perception     Praxis      Pertinent Vitals/Pain Pain Assessment: Faces Faces Pain Scale: Hurts little more Pain Location: R foot Pain Descriptors / Indicators: Tender;Throbbing Pain Intervention(s): Monitored during session     Hand Dominance Right   Extremity/Trunk Assessment Upper Extremity Assessment Upper Extremity Assessment: Generalized weakness   Lower Extremity Assessment Lower Extremity Assessment: Defer to PT evaluation   Cervical / Trunk Assessment Cervical / Trunk Assessment: Normal  Communication Communication Communication: No difficulties   Cognition Arousal/Alertness: Awake/alert Behavior During Therapy: Flat affect Overall Cognitive Status: No family/caregiver present to determine baseline cognitive functioning                         Following Commands: Follows one step commands consistently     Problem Solving: Slow  processing;Difficulty sequencing;Requires verbal cues;Requires tactile cues     General Comments       Exercises     Shoulder Instructions      Home Living Family/patient expects to be discharged to:: Private residence Living Arrangements: Spouse/significant other Available Help at Discharge: Family;Available PRN/intermittently Type of Home: House Home Access: Stairs to enter [employer] of Steps: 3 Entrance Stairs-Rails: Can reach both Home Layout: Two level;Able to live on main level with bedroom/bathroom;Full bath on main level     Bathroom Shower/Tub: Tub/shower unit;Walk-in shower   Bathroom Toilet: Standard Bathroom Accessibility: Yes How Accessible: Accessible via walker Home Equipment: Cane - single point   Additional Comments: Per chart:  Pt reports family will not assist him much.  Lives With: Spouse;Daughter;Son;Other (Comment)    Prior Functioning/Environment Level of Independence: Independent with assistive device(s)        Comments: Most recently at CIR after the L BKA        OT Problem List: Decreased activity tolerance;Impaired balance (sitting and/or standing);Decreased cognition;Decreased knowledge of use of DME or AE;Cardiopulmonary status limiting activity;Decreased safety awareness;Decreased strength;Decreased range of motion;Impaired sensation;Impaired UE functional use;Pain      OT Treatment/Interventions: Self-care/ADL training;Therapeutic exercise;Energy conservation;DME and/or AE instruction;Therapeutic activities;Patient/family education;Balance training    OT Goals(Current goals can be found in the care plan section) Acute Rehab OT Goals Patient Stated Goal: to get better OT Goal Formulation: With patient Time For Goal Achievement: 02/19/21 Potential to Achieve Goals: Good ADL Goals Pt Will Perform Lower Body Bathing: with set-up;sitting/lateral leans Pt Will Perform Lower Body Dressing: with set-up;sitting/lateral leans Pt  Will Transfer to Toilet: with transfer board;bedside commode  OT Frequency: Min 2X/week   Barriers to D/C:  None noted          Co-evaluation              AM-PAC OT "6 Clicks" Daily Activity     Outcome Measure Help from another person eating meals?: None Help from another person taking care of personal grooming?: None Help from another person toileting, which includes using toliet, bedpan, or urinal?: A Lot Help from another person bathing (including washing, rinsing, drying)?: A Lot Help from another person to put on and taking off regular upper body clothing?: None Help from another person to put on and taking off regular lower body clothing?: A Lot 6 Click Score: 18   End of Session Equipment Utilized During Treatment: Rolling walker;Gait belt;Other (comment) Nurse Communication: Mobility status  Activity Tolerance: Patient tolerated treatment well Patient left: with call bell/phone within reach;in bed  OT Visit Diagnosis: Unsteadiness on feet (R26.81);Other abnormalities of gait and mobility (R26.89);Muscle weakness (generalized) (M62.81);Pain Pain - Right/Left: Right Pain - part of body: Ankle and joints of foot                Time: 1050-1107 OT Time Calculation (min): 17 min Charges:  OT General Charges $OT Visit: 1 Visit OT Evaluation $OT Eval Moderate Complexity: 1 Mod  02/05/2021  Rich, OTR/L  Acute Rehabilitation Services  Office:  [phone]   Suzanna Obey 02/05/2021, 11:23 AM

## 2021-02-05 NOTE — Progress Notes (Signed)
Pt's systolic in the low 90's to low 100's.  PA notified.  Verbal to hold isosorbide and spironolactone. Will continue to monitor.

## 2021-02-05 NOTE — PMR Pre-admission (Signed)
PMR Admission Coordinator Pre-Admission Assessment  Patient: Richard [name] is an 59 y.o., male MRN: [id number] DOB: 09/02/1960 Height:   Weight: 106.6 kg  Insurance Information HMO:     PPO:      PCP:      IPA:      80/20: yes     OTHER:  PRIMARY: Medicare A & B      Policy#: [id number]       Subscriber: patient CM Name:       Phone#:      Fax#:  Pre-Cert#:       Employer:  Benefits:  Phone #: verified online via [employer] on 02/05/21     Name:  Eff. Date: Part A & B effective 09/24/04     Deduct: $1,556      Out of Pocket Max: NA      Life Max: NA CIR: 100%      SNF: 100% days 1-20, 80% days 21-100 Outpatient: 80%     Co-Pay: 2% Home Health: 100%      Co-Pay:  DME: 80%     Co-Pay: 20% Providers: pt's choice SECONDARY: Medicaid of [place]      Policy#: [id number]     Phone#:   Financial Counselor:       Phone#:   The "Data Collection Information Summary" for patients in Inpatient Rehabilitation Facilities with attached "Privacy Act Statement-Health Care Records" was provided and verbally reviewed with: Patient  Emergency Contact Information Contact Information     Name Relation Home Work Mobile   Napper,[name] Friend   [phone]   Heuerman,Helen Mother [phone]     Medley,Dorothy Relative [phone]         Current Medical History  Patient Admitting Diagnosis: L BKA, 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 Pt admitted to hospital on 01/15/21 with body aches and SOB. Workup for CHF vs COPD exacerbation. 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 TEE was negative. 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 Pt admitted to CIR on 01/27/21. Vascular was consulted d/t blister on  pt's right foot. Aortogram with right lower extremity arteriogram was recommended.  Pt re-admitted to acute hospital on 02/01/21 for revascularization to right lower extremity.  Ortho evaluated right foot wounds and recommended right first and second ray amputation. Pt underwent right foot first and second ray amputation on 02/04/21.  Therapy evaluations completed and CIR recommended d/t pt's deficits in functional mobility.     [name] medical record from [facility] has been reviewed by the rehabilitation admission coordinator and physician.  Past Medical History  Past Medical History:  Diagnosis Date   AICD (automatic cardioverter/defibrillator) present 01/11/2017   Anxiety    CHF (congestive heart failure) (HCC)    CKD (chronic kidney disease)    COPD (chronic obstructive pulmonary disease) (HCC)    never smoker, industrial exposure   Diabetes mellitus with diabetic neuropathy, with long-term current use of insulin (HCC)    GERD (gastroesophageal reflux disease)    High cholesterol    History of gout    "take RX qd" (11/01/2017)   Hypertension    Nonobstructive atherosclerosis of coronary artery      On home oxygen therapy    "2L prn" (11/01/2017)   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HCC)    LLL, present for years   Systolic HF (heart failure) ([place])     Family History   family history includes Diabetes in his brother, father, mother, and sister; Hypertension in his father and mother.  Prior Rehab/Hospitalizations Has the patient had prior rehab or hospitalizations prior to admission? Yes  Has the patient had major surgery during 100 days prior to admission? Yes   Current Medications  Current Facility-Administered Medications:    0.9 %  sodium chloride infusion, 250 mL, Intravenous, PRN, Persons, Bevely Palmer, PA   0.9 %  sodium chloride infusion, , Intravenous, Continuous, Persons, Bevely Palmer, [place], Stopped at 02/06/21 2156   acetaminophen (TYLENOL) tablet 650 mg, 650 mg, Oral, Q4H  PRN, Persons, Bevely Palmer, PA, 650 mg at 02/06/21 2151   albuterol (PROVENTIL) (2.5 MG/3ML) 0.083% nebulizer solution 2.5 mg, 2.5 mg, Nebulization, Q4H PRN, Persons, Bevely Palmer, PA   allopurinol (ZYLOPRIM) tablet 100 mg, 100 mg, Oral, Daily, Persons, Bevely Palmer, PA, 100 mg at 02/07/21 0813   alum & mag hydroxide-simeth (MAALOX/MYLANTA) 200-200-20 MG/5ML suspension 15-30 mL, 15-30 mL, Oral, Q2H PRN, Persons, Bevely Palmer, PA   ascorbic acid (VITAMIN C) tablet 1,000 mg, 1,000 mg, Oral, Daily, Persons, Bevely Palmer, PA, 1,000 mg at 02/07/21 0813   aspirin chewable tablet 81 mg, 81 mg, Oral, Daily, Persons, Bevely Palmer, PA, 81 mg at 02/07/21 7017   atorvastatin (LIPITOR) tablet 40 mg, 40 mg, Oral, QPM, Persons, Bevely Palmer, PA, 40 mg at 02/06/21 1811   bisacodyl (DULCOLAX) EC tablet 5 mg, 5 mg, Oral, Daily PRN, Persons, Bevely Palmer, PA   bisoprolol (ZEBETA) tablet 2.5 mg, 2.5 mg, Oral, Daily, Persons, Bevely Palmer, PA, 2.5 mg at 02/07/21 0813   busPIRone (BUSPAR) tablet 5 mg, 5 mg, Oral, BID, Persons, Bevely Palmer, PA, 5 mg at 02/07/21 0813   Chlorhexidine Gluconate Cloth 2 % PADS 6 each, 6 each, Topical, Daily, Persons, Bevely Palmer, [place], 6 each at 02/06/21 1347   clopidogrel (PLAVIX) tablet 75 mg, 75 mg, Oral, Daily, Persons, Bevely Palmer, PA, 75 mg at 02/07/21 0813   docusate sodium (COLACE) capsule 100 mg, 100 mg, Oral, Daily, Persons, Bevely Palmer, PA, 100 mg at 02/07/21 0813   DULoxetine (CYMBALTA) DR capsule 60 mg, 60 mg, Oral, Daily, Persons, Bevely Palmer, PA, 60 mg at 02/07/21 0813   empagliflozin (JARDIANCE) tablet 10 mg, 10 mg, Oral, Daily, Persons, Bevely Palmer, PA, 10 mg at 02/07/21 7939   gabapentin (NEURONTIN) capsule 100 mg, 100 mg, Oral, TID, Persons, Bevely Palmer, PA, 100 mg at 02/07/21 0813   guaiFENesin-dextromethorphan (ROBITUSSIN DM) 100-10 MG/5ML syrup 15 mL, 15 mL, Oral, Q4H PRN, Persons, Bevely Palmer, PA   hydrALAZINE (APRESOLINE) injection 5 mg, 5 mg, Intravenous, Q20 Min PRN, Persons, Bevely Palmer, PA    HYDROcodone-acetaminophen (NORCO/VICODIN) 5-325 MG per tablet 1-2 tablet, 1-2 tablet, Oral, Q6H PRN, Persons, Bevely Palmer, [place], 2 tablet at 02/06/21 0325   HYDROmorphone (DILAUDID) injection 0.5 mg, 0.5 mg, Intravenous, Q4H PRN, Persons, Bevely Palmer, PA   insulin aspart (novoLOG) injection 0-15 Units, 0-15 Units, Subcutaneous, TID WC, Persons, Bevely Palmer, [place], 2 Units at 02/07/21 0816   insulin aspart (novoLOG) injection 0-5 Units, 0-5 Units, Subcutaneous, QHS, Persons, Bevely Palmer, [place], 2 Units at 02/04/21 2142   insulin aspart (novoLOG) injection 4 Units, 4 Units, Subcutaneous, TID WC, Persons, Bevely Palmer, [place], 4 Units at 02/07/21 [phone]  insulin glargine-yfgn (SEMGLEE) injection 13 Units, 13 Units, Subcutaneous, BID, Persons, [name] Anne, PA, 13 Units at 02/07/21 0815   isosorbide mononitrate (IMDUR) 24 hr tablet 30 mg, 30 mg, Oral, Daily, Persons, [name] Anne, PA, 30 mg at 02/07/21 0813   labetalol (NORMODYNE) injection 10 mg, 10 mg, Intravenous, Q10 min PRN, Persons, [name] Anne, PA   melatonin tablet 3 mg, 3 mg, Oral, QHS, Brabham, Vance W, MD, 3 mg at 02/06/21 2151   morphine 4 MG/ML injection 2 mg, 2 mg, Intravenous, Q1H PRN, Persons, [name] Anne, PA   multivitamin with minerals tablet 1 tablet, 1 tablet, Oral, Daily, Persons, [name] Anne, PA, 1 tablet at 02/07/21 0813   nutrition supplement (JUVEN) (JUVEN) powder packet 1 packet, 1 packet, Oral, BID BM, Persons, [name] Anne, PA, 1 packet at 02/07/21 0815   ondansetron (ZOFRAN) injection 4 mg, 4 mg, Intravenous, Q6H PRN, Persons, [name] Anne, PA, 4 mg at 02/05/21 0800   oxyCODONE (Oxy IR/ROXICODONE) immediate release tablet 5-10 mg, 5-10 mg, Oral, Q4H PRN, Persons, [name] Anne, PA   pantoprazole (PROTONIX) EC tablet 40 mg, 40 mg, Oral, BID, Persons, [name] Anne, PA, 40 mg at 02/07/21 0813   phenol (CHLORASEPTIC) mouth spray 1 spray, 1 spray, Mouth/Throat, PRN, Persons, [name] Anne, PA   polyethylene glycol (MIRALAX / GLYCOLAX) packet 17 g, 17 g, Oral, Daily PRN, Persons,  [name] Anne, PA   sacubitril-valsartan (ENTRESTO) 24-26 mg per tablet, 1 tablet, Oral, BID, Persons, [name] Anne, PA, 1 tablet at 02/06/21 1100   senna-docusate (Senokot-S) tablet 2 tablet, 2 tablet, Oral, QPC supper, Persons, [name] Anne, PA, 2 tablet at 02/06/21 1810   sodium chloride flush (NS) 0.9 % injection 3 mL, 3 mL, Intravenous, Q12H, Persons, [name] Anne, PA, 3 mL at 02/07/21 0817   sodium chloride flush (NS) 0.9 % injection 3 mL, 3 mL, Intravenous, PRN, Persons, [name] Anne, PA   spironolactone (ALDACTONE) tablet 12.5 mg, 12.5 mg, Oral, Daily, Persons, [name] Anne, PA, 12.5 mg at 02/07/21 0812   torsemide (DEMADEX) tablet 40 mg, 40 mg, Oral, Daily, Persons, [name] Anne, PA, 40 mg at 02/07/21 0811   zinc sulfate capsule 220 mg, 220 mg, Oral, Daily, Persons, [name] Anne, PA, 220 mg at 02/07/21 0813  Patients Current Diet:  Diet Order             Diet Carb Modified Fluid consistency: Thin; Room service appropriate? Yes  Diet effective now                   Precautions / Restrictions Precautions Precautions: Fall, Other (comment) Precaution Comments: Recent 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); R foot portable wound vac Other Brace: L BKA limb guard (notably soiled, cleaned as best as I could during session 8/13, will let dry; pink foam still with crusty blood); R foot post-op shoe Restrictions Weight Bearing Restrictions: Yes RLE Weight Bearing: Touchdown weight bearing LLE Weight Bearing: Non weight bearing Other Position/Activity Restrictions: Per MD note for R foot: "Weightbearing: Ideally nonweightbearing on the right but touchdown weightbearing on the heel would be acceptable".   Has the patient had 2 or more falls or a fall with injury in the past year? Yes  Prior Activity Level Limited Community (1-2x/wk): MD appointments  Prior Functional Level Self Care: Did the patient need help bathing, dressing, using the toilet or eating? Independent  Indoor Mobility: Did the patient need assistance with  walking from room to room (with or without device)? Independent  Stairs: Did the patient need assistance with internal   or external stairs (with or without device)? Needed some help  Functional Cognition: Did the patient need help planning regular tasks such as shopping or remembering to take medications? Needed some help  Home Assistive Devices / Equipment Home Assistive Devices/Equipment: Nebulizer Home Equipment: Cane - single point  Prior Device Use: Indicate devices/aids used by the patient prior to current illness, exacerbation or injury? None of the above  Current Functional Level Cognition  Overall Cognitive Status: No family/caregiver present to determine baseline cognitive functioning Current Attention Level: Sustained, Selective Orientation Level: Oriented X4 Following Commands: Follows one step commands consistently Safety/Judgement: Decreased awareness of safety, Decreased awareness of deficits    Extremity Assessment (includes Sensation/Coordination)  Upper Extremity Assessment: Generalized weakness  Lower Extremity Assessment: Generalized weakness, RLE deficits/detail, LLE deficits/detail RLE Deficits / Details: s/p R 1-2 ray amputations, wrapped with wound vac; hip and knee functionally at least 3/5 strength; pt reports little to no sensation in lower leg/foot; chronic lower leg wounds RLE Sensation: decreased light touch, history of peripheral neuropathy RLE Coordination: decreased fine motor LLE Deficits / Details: s/p recent 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); able to perform straight leg raise, good knee ROM    ADLs  Eating/Feeding: Independent, Sitting Grooming: Wash/dry face, Wash/dry hands, Set up, Sitting Upper Body Bathing: Set up, Sitting Lower Body Bathing: Moderate assistance, Sitting/lateral leans, Minimal assistance Upper Body Dressing : Sitting, Set up Lower Body Dressing: Sitting/lateral leans, Moderate assistance    Mobility  Overal bed mobility: Needs  Assistance Bed Mobility: Supine to Sit Supine to sit: Mod assist, HOB elevated Sit to supine: Supervision General bed mobility comments: ModA for HHA to elevate trunk    Transfers  Overall transfer level: Needs assistance Equipment used: None Transfers: Lateral/Scoot Transfers Sit to Stand: Min assist  Lateral/Scoot Transfers: Min assist General transfer comment: Deferred standing/pivot transfer secondary to WB precautions ("Ideally NWB, or TDWB through heel only"); pt requires assist for lateral scoot transfer to recliner, minA for safety, cues to minimize WB through R foot    Ambulation / Gait / Stairs / Wheelchair Mobility       Posture / Balance   Dynamic Sitting Balance Sitting balance - Comments: Required assist to don R foot post op shoe Balance Overall balance assessment: Needs assistance Sitting-balance support: No upper extremity supported, Feet supported Sitting balance-Leahy Scale: Fair Sitting balance - Comments: Required assist to don R foot post op shoe Standing balance support: Bilateral upper extremity supported Standing balance-Leahy Scale: Poor Standing balance comment: Reliant on BUE support and external assist    Special needs/care consideration Continuous Drip IV  0.9% sodium chloride infusion, Wound Vac right foot, Skin Amputation: left leg; Blister: right foot; Cracking: left leg; surgical incision: right foot/left leg-lower left BKA stump; Non-pressure wound: pretibial right small oval like opening in anterior aspect of tibia, Diabetic management novoLOG 4 units 3 times daily at meals;novoLOG 0-5 units daily at bedtime; novoLOG 0-15 units 3 times daily at meals , and Designated visitor Anza (from acute therapy documentation) Living Arrangements: Spouse/significant other, Children  Lives With: Spouse, Daughter, Son, Other (Comment) Available Help at Discharge: Family, Available PRN/intermittently Type of Home: House Home  Layout: Two level, Able to live on main level with bedroom/bathroom, Full bath on main level Home Access: Stairs to enter Entrance Stairs-Rails: Can reach both Entrance Stairs-Number of Steps: 4 in front, 3 on side Bathroom Shower/Tub: Tub/shower unit, Multimedia programmer: Standard Bathroom Accessibility: Yes How Accessible: Accessible via walker  Home Care Services: No Additional Comments: Per chart:  Pt reports family will not assist him much.  Discharge Living Setting Plans for Discharge Living Setting: [name] home Type of Home at Discharge: House Discharge Home Layout: Multi-level, Able to live on main level with bedroom/bathroom Discharge Home Access: Stairs to enter Entrance Stairs-Rails: Can reach both (rails by steps in front. No rail by steps on side of house) Entrance Stairs-Number of Steps: 4 steps in front, 3 steps on side Discharge Bathroom Shower/Tub: Tub/shower unit Discharge Bathroom Toilet: Standard Discharge Bathroom Accessibility: Yes How Accessible: Accessible via walker Does the patient have any problems obtaining your medications?: No  Social/Family/Support Systems Patient Roles: Parent Anticipated Caregiver: self/[name] and other family Anticipated Caregiver's Contact Information: [phone]; [phone] Ability/Limitations of Caregiver: family can provide supervision level assistance and light physical assistance Caregiver Availability: Intermittent Discharge Plan Discussed with Primary Caregiver: Yes Is Caregiver In Agreement with Plan?: Yes Does Caregiver/Family have Issues with Lodging/Transportation while Pt is in Rehab?: No   Goals Patient/Family Goal for Rehab: PT/OT Supervision Expected length of stay: 14-16 days Pt/Family Agrees to Admission and willing to participate: Yes Program Orientation Provided & Reviewed with Pt/Caregiver Including Roles  & Responsibilities: Yes  Decrease burden of Care through IP rehab admission:  Specialzed equipment needs, Decrease number of caregivers, Bowel and bladder program, and Patient/family education  Possible need for SNF placement upon discharge: Not anticipated  Patient Condition: I have reviewed medical records from [facility], spoken with CM, and patient. I met with patient at the bedside for inpatient rehabilitation assessment.  Patient will benefit from ongoing PT and OT, can actively participate in 3 hours of therapy a day 5 days of the week, and can make measurable gains during the admission.  Patient will also benefit from the coordinated team approach during an Inpatient Acute Rehabilitation admission.  The patient will receive intensive therapy as well as Rehabilitation physician, nursing, social worker, and care management interventions.  Due to safety, skin/wound care, disease management, medication administration, pain management, and patient education the patient requires 24 hour a day rehabilitation nursing.  The patient is currently Supervision-Mod A with mobility and Set up-Mod A with basic ADLs.  Discharge setting and therapy post discharge at home with home health is anticipated.  Patient has agreed to participate in the Acute Inpatient Rehabilitation Program and will admit today.  Preadmission Screen Completed By:  Bethel Born, 02/07/2021 11:31 AM ______________________________________________________________________   Discussed status with Dr. Naaman Plummer on 02/07/21  at 11:31 AM and received approval for admission today.  Admission Coordinator:  Bethel Born, CCC-SLP, time 11:31 AM/Date 02/07/21    Assessment/Plan: Diagnosis:  Left trans tib amp 01/21/21, Right 1st and 2nd ray amp 01/31/21 Does the need for close, 24 hr/day Medical supervision in concert with the patient's rehab needs make it unreasonable for this patient to be served in a less intensive setting? Yes Co-Morbidities requiring supervision/potential complications: CKD 3,  HLD, hx COPD Due to bladder management, bowel management, safety, skin/wound care, disease management, medication administration, pain management, and patient education, does the patient require 24 hr/day rehab nursing? Yes Does the patient require coordinated care of a physician, rehab nurse, PT, OT, and SLP to address physical and functional deficits in the context of the above medical diagnosis(es)? Yes Addressing deficits in the following areas: balance, endurance, locomotion, strength, transferring, bowel/bladder control, bathing, dressing, feeding, grooming, toileting, and psychosocial support Can the patient actively participate in an intensive therapy program of  at least 3 hrs of therapy 5 days a week? Yes The potential for patient to make measurable gains while on inpatient rehab is good Anticipated functional outcomes upon discharge from inpatient rehab: supervision PT, supervision OT, n/a SLP Estimated rehab length of stay to reach the above functional goals is: 14-16d Anticipated discharge destination: Home 10. Overall Rehab/Functional Prognosis: good   MD Signature: Charlett Blake M.D. [employer] Fellow Am Acad of [employer] Med and Rehab Diplomate Am Board of Electrodiagnostic Med Fellow Am Board of Interventional Pain

## 2021-02-05 NOTE — Progress Notes (Signed)
Patient ID: Richard [name], male   DOB: 1961/02/12, 60 y.o.   MRN: [id number] Patient is postoperative day 1 right foot first and second ray amputation.  Patient is doing well without complaints.  Plan for discharge when he is safe with therapy.  I will contact [facility] on Monday to get him a stump shrinker for his other leg.

## 2021-02-05 NOTE — Progress Notes (Signed)
      Subjective  - POD #4, status post abdominal aortogram with angioplasty of the right posterior tibial artery  The patient underwent catheterization by Dr. Lajoyce Corners yesterday.  He has no complaints this morning   Physical Exam:  Dressings remain clean dry and intact Nonlabored respirations    Assessment/Plan:    Patient remained stable from a vascular perspective.  He can return to rehab today.  Continue aspirin statin and Plavix  Wells Oshea Percival 02/05/2021 9:23 AM --  Vitals:   02/04/21 2342 02/05/21 0500  BP: 111/64 110/78  Pulse: 93 100  Resp: 19 18  Temp: 98.4 F (36.9 C) 98.4 F (36.9 C)  SpO2: 97% 98%    Intake/Output Summary (Last 24 hours) at 02/05/2021 0923 Last data filed at 02/05/2021 0641 Gross per 24 hour  Intake 1017.44 ml  Output 1450 ml  Net -432.56 ml     Laboratory CBC    Component Value Date/Time   WBC 11.6 (H) 02/05/2021 0508   HGB 13.5 02/05/2021 0508   HCT 42.5 02/05/2021 0508   PLT 232 02/05/2021 0508    BMET    Component Value Date/Time   NA 134 (L) 02/05/2021 0508   K 4.5 02/05/2021 0508   CL 100 02/05/2021 0508   CO2 27 02/05/2021 0508   GLUCOSE 185 (H) 02/05/2021 0508   BUN 27 (H) 02/05/2021 0508   CREATININE 1.06 02/05/2021 0508   CALCIUM 8.8 (L) 02/05/2021 0508   GFRNONAA >60 02/05/2021 0508   GFRAA >60 01/02/2020 1516    COAG Lab Results  Component Value Date   INR 1.12 11/01/2017   No results found for: PTT  Antibiotics Anti-infectives (From admission, onward)    Start     Dose/Rate Route Frequency Ordered Stop   02/05/21 0600  ceFAZolin (ANCEF) IVPB 2g/100 mL premix        2 g 200 mL/hr over 30 Minutes Intravenous On call to O.R. 02/04/21 1421 02/04/21 1542   02/05/21 0000  ceFAZolin (ANCEF) IVPB 2g/100 mL premix        2 g 200 mL/hr over 30 Minutes Intravenous Every 8 hours 02/04/21 1648 02/05/21 0711   02/04/21 1423  ceFAZolin (ANCEF) 2-4 GM/100ML-% IVPB       Note to Pharmacy: Launa Flight   :  cabinet override      02/04/21 1423 02/04/21 1546        V. Charlena Cross, M.D., [facility] [place] Office: [phone] Pager:  [phone]

## 2021-02-05 NOTE — Evaluation (Signed)
Physical Therapy Evaluation Patient Details Name: Richard [name] MRN: [id number] DOB: 1961-03-08 Today's Date: 02/05/2021   History of Present Illness  Pt is a 60 y.o. 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 S/p RLE angioplasty 8/9. 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 PMH includes DM2, COPD (on 2L O2 baseline), CHF, AICD, CKD, HTN.   Clinical Impression  Pt presents with an overall decrease in functional mobility secondary to above. Prior to initial admission 01/15/21, pt independent, lives with family; since d/c, pt has been at [facility] for post-acute rehab s/p L BKA. Pt readmitted for R foot ray amputations, with limited mobility secondary to new RLE WB restrictions. Pt requiring modA for bed mobility and totalA for transfer set-up to recliner. Reviewed educ re: precautions, positioning, importance of mobility. Will plan to initiate w/c-training next session. Pt would benefit from continued acute PT services to maximize functional mobility and independence prior to d/c with continued CIR-level therapies.     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      Mobility  Bed Mobility Overal bed mobility: Needs Assistance Bed Mobility: Supine to Sit     Supine to sit: Mod assist;HOB elevated Sit to supine: Supervision   General bed mobility comments: ModA for HHA to elevate trunk    Transfers Overall transfer level: Needs assistance Equipment used: None Transfers: Lateral/Scoot Transfers Sit to Stand: Min assist        Lateral/Scoot Transfers: Min assist General transfer comment: Deferred standing/pivot transfer secondary to WB precautions ("Ideally NWB, or TDWB through heel only"); pt requires assist for lateral scoot transfer to recliner, minA for safety, cues to minimize WB through R foot  Ambulation/Gait                Stairs            Wheelchair Mobility    Modified Rankin (Stroke Patients Only)       Balance Overall balance assessment: Needs assistance Sitting-balance support: No upper extremity supported;Feet supported Sitting balance-Leahy Scale: Fair Sitting balance - Comments: Required assist to don R foot post op shoe   Standing balance support: Bilateral upper extremity supported Standing balance-Leahy Scale: Poor Standing balance comment: Reliant on BUE support and external assist                             Pertinent Vitals/Pain Pain Assessment: Faces Faces Pain Scale: Hurts a little bit Pain Location: BLE posterior chain with stretches/lower body ADLs Pain Descriptors / Indicators: Tightness Pain Intervention(s): Monitored during session    Home Living Family/patient expects to be discharged to:: Inpatient rehab Living Arrangements: Spouse/significant  other Available Help at Discharge: Family;Available PRN/intermittently Type of Home: House Home Access: Stairs to enter Entrance Stairs-Rails: Can reach both Entrance Stairs-Number of Steps: 3 Home Layout: Two level;Able  to live on main level with bedroom/bathroom;Full bath on main level Home Equipment: Cane - single point Additional Comments: Per chart:  Pt reports family will not assist him much.    Prior Function Level of Independence: Independent with assistive device(s)         Comments: Prior to initial admission, pt independent, driving, does not work. Has been at [facility] for post-acute rehab s/p L BKA     Hand Dominance   Dominant Hand: Right    Extremity/Trunk Assessment   Upper Extremity Assessment Upper Extremity Assessment: Generalized weakness    Lower Extremity Assessment Lower Extremity Assessment: Generalized weakness;RLE deficits/detail;LLE deficits/detail RLE Deficits / Details: s/p R 1-2 ray amputations, wrapped with wound vac; hip and knee functionally at least 3/5 strength; pt reports little to no sensation in lower leg/foot; chronic lower leg wounds RLE Sensation: decreased light touch;history of peripheral neuropathy RLE Coordination: decreased fine motor LLE Deficits / Details: s/p recent 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); able to perform straight leg raise, good knee ROM    Cervical / Trunk Assessment Cervical / Trunk Assessment: Normal  Communication   Communication: No difficulties  Cognition Arousal/Alertness: Awake/alert Behavior During Therapy: Flat affect Overall Cognitive Status: No family/caregiver present to determine baseline cognitive functioning Area of Impairment: Attention;Following commands;Safety/judgement;Awareness;Problem solving;Memory                   Current Attention Level: Sustained;Selective Memory: Decreased short-term memory Following Commands: Follows one step commands consistently Safety/Judgement: Decreased awareness of safety;Decreased awareness of deficits Awareness: Emergent Problem Solving: Slow processing;Difficulty sequencing;Requires verbal cues;Requires tactile cues        General Comments General comments (skin integrity, edema,  etc.): Pt's limb guard soiled, cleaned as best I could during session (pink foam with crusted blood, unable to clean this without damaging foam), will let dry before donning; therefore, L knee positioned with padding to allow resting in full extension; reinforced importance of precautions and positioning for L knee straight    Exercises Other Exercises Other Exercises: Significant lower back/hamstring tightness noted with attempts to perform LB ADLs while seated with legs up in recliner   Assessment/Plan    PT Assessment Patient needs continued PT services  PT Problem List Decreased strength;Decreased range of motion;Decreased activity tolerance;Decreased balance;Decreased mobility;Decreased coordination;Decreased cognition;Decreased knowledge of use of DME;Decreased safety awareness;Cardiopulmonary status limiting activity;Decreased skin integrity;Pain       PT Treatment Interventions DME instruction;Functional mobility training;Therapeutic activities;Therapeutic exercise;Balance training;Patient/family education;Wheelchair mobility training    PT Goals (Current goals can be found in the Care Plan section)  Acute Rehab PT Goals Patient Stated Goal: Go home PT Goal Formulation: With patient Time For Goal Achievement: 02/19/21 Potential to Achieve Goals: Good    Frequency Min 3X/week   Barriers to discharge        Co-evaluation               AM-PAC PT "6 Clicks" Mobility  Outcome Measure Help needed turning from your back to your side while in a flat bed without using bedrails?: A Little Help needed moving from lying on your back to sitting on the side of a flat bed without using bedrails?: A Lot Help needed moving to and from a bed to a chair (including a wheelchair)?: A Little Help needed standing up from a chair using  your arms (e.g., wheelchair or bedside chair)?: A Lot Help needed to walk in hospital room?: Total Help needed climbing 3-5 steps with a railing? : Total 6  Click Score: 12    End of Session   Activity Tolerance: Patient tolerated treatment well Patient left: in chair;with call bell/phone within reach;with chair alarm set Nurse Communication: Mobility status PT Visit Diagnosis: Other abnormalities of gait and mobility (R26.89);Muscle weakness (generalized) (M62.81)    Time: 1740-8144 PT Time Calculation (min) (ACUTE ONLY): 28 min   Charges:   PT Evaluation $PT Eval Moderate Complexity: 1 Mod PT Treatments $Therapeutic Activity: 8-22 mins      Ina Homes, PT, DPT Acute Rehabilitation Services  Pager [phone] Office [phone]  Malachy Chamber 02/05/2021, 2:57 PM

## 2021-02-06 DIAGNOSIS — I70234 Atherosclerosis of native arteries of right leg with ulceration of heel and midfoot: Secondary | ICD-10-CM

## 2021-02-06 LAB — BASIC METABOLIC PANEL
Anion gap: 7 (ref 5–15)
BUN: 24 mg/dL — ABNORMAL HIGH (ref 6–20)
CO2: 26 mmol/L (ref 22–32)
Calcium: 8.3 mg/dL — ABNORMAL LOW (ref 8.9–10.3)
Chloride: 100 mmol/L (ref 98–111)
Creatinine, Ser: 0.98 mg/dL (ref 0.61–1.24)
GFR, Estimated: >60 mL/min (ref >60)
Glucose, Bld: 105 mg/dL — ABNORMAL HIGH (ref 70–99)
Potassium: 4.1 mmol/L (ref 3.5–5.1)
Sodium: 133 mmol/L — ABNORMAL LOW (ref 135–145)

## 2021-02-06 LAB — CBC
HCT: 36 % — ABNORMAL LOW (ref 39.0–52.0)
Hemoglobin: 11.8 g/dL — ABNORMAL LOW (ref 13.0–17.0)
MCH: 29 pg (ref 26.0–34.0)
MCHC: 32.8 g/dL (ref 30.0–36.0)
MCV: 88.5 fL (ref 80.0–100.0)
Platelets: 200 10*3/uL (ref 150–400)
RBC: 4.07 MIL/uL — ABNORMAL LOW (ref 4.22–5.81)
RDW: 18.2 % — ABNORMAL HIGH (ref 11.5–15.5)
WBC: 12.3 10*3/uL — ABNORMAL HIGH (ref 4.0–10.5)
nRBC: 0 % (ref 0.0–0.2)

## 2021-02-06 LAB — GLUCOSE, CAPILLARY
Glucose-Capillary: 104 mg/dL — ABNORMAL HIGH (ref 70–99)
Glucose-Capillary: 119 mg/dL — ABNORMAL HIGH (ref 70–99)
Glucose-Capillary: 108 mg/dL — ABNORMAL HIGH (ref 70–99)
Glucose-Capillary: 112 mg/dL — ABNORMAL HIGH (ref 70–99)
Glucose-Capillary: 116 mg/dL — ABNORMAL HIGH (ref 70–99)

## 2021-02-06 MED ORDER — MELATONIN 3 MG PO TABS
3.0000 mg | ORAL_TABLET | Freq: Every day | ORAL | Status: DC
  Administered 2021-02-06: 3 mg via ORAL
  Filled 2021-02-06: qty 1

## 2021-02-06 NOTE — Progress Notes (Signed)
Inpatient Rehab Admissions Coordinator:  Informed by PA that pt stable to return to CIR.  Readmission to CIR pending bed availability and physiatrist approval.  Saw pt at bedside. Updated him on status of readmission to CIR. Pt gave permission to contact [facility]. Left a message; awaiting return call.  Will continue to follow.   Wolfgang Phoenix, MS, CCC-SLP Admissions Coordinator [phone]

## 2021-02-06 NOTE — Progress Notes (Signed)
    Subjective  -   No acute events   Physical Exam:  Dressing in place Abd soft Neuro intact       Assessment/Plan:    Awaiting transfer to rehab Continue  ASA, plavix, statin  Wells Richard [name] 02/06/2021 10:59 AM --  Vitals:   02/06/21 0359 02/06/21 0806  BP: (!) 91/50 108/75  Pulse: 89 82  Resp: 20 17  Temp: 98.4 F (36.9 C) 98.1 F (36.7 C)  SpO2: 97% 96%    Intake/Output Summary (Last 24 hours) at 02/06/2021 1059 Last data filed at 02/05/2021 2252 Gross per 24 hour  Intake 240 ml  Output 1275 ml  Net -1035 ml     Laboratory CBC    Component Value Date/Time   WBC 12.3 (H) 02/06/2021 0500   HGB 11.8 (L) 02/06/2021 0500   HCT 36.0 (L) 02/06/2021 0500   PLT 200 02/06/2021 0500    BMET    Component Value Date/Time   NA 133 (L) 02/06/2021 0500   K 4.1 02/06/2021 0500   CL 100 02/06/2021 0500   CO2 26 02/06/2021 0500   GLUCOSE 105 (H) 02/06/2021 0500   BUN 24 (H) 02/06/2021 0500   CREATININE 0.98 02/06/2021 0500   CALCIUM 8.3 (L) 02/06/2021 0500   GFRNONAA >60 02/06/2021 0500   GFRAA >60 01/02/2020 1516    COAG Lab Results  Component Value Date   INR 1.12 11/01/2017   No results found for: PTT  Antibiotics Anti-infectives (From admission, onward)    Start     Dose/Rate Route Frequency Ordered Stop   02/05/21 0600  ceFAZolin (ANCEF) IVPB 2g/100 mL premix        2 g 200 mL/hr over 30 Minutes Intravenous On call to O.R. 02/04/21 1421 02/05/21 0730   02/05/21 0000  ceFAZolin (ANCEF) IVPB 2g/100 mL premix        2 g 200 mL/hr over 30 Minutes Intravenous Every 8 hours 02/04/21 1648 02/05/21 0715   02/04/21 1423  ceFAZolin (ANCEF) 2-4 GM/100ML-% IVPB       Note to Pharmacy: Richard [name]   : cabinet override      02/04/21 1423 02/04/21 1546        V. Charlena Cross, M.D., [facility] [place] Office: [phone] Pager:  [phone]

## 2021-02-06 NOTE — Anesthesia Postprocedure Evaluation (Signed)
Anesthesia Post Note  Patient: Richard [name]  Procedure(s) Performed: RIGHT FOOT 1ST AND 2ND RAY AMPUTATION (Right) APPLICATION OF WOUND VAC     Patient location during evaluation: PACU Anesthesia Type: Regional and MAC Level of consciousness: awake and alert and oriented Pain management: pain level controlled Vital Signs Assessment: post-procedure vital signs reviewed and stable Respiratory status: spontaneous breathing, nonlabored ventilation and respiratory function stable Cardiovascular status: blood pressure returned to baseline Postop Assessment: no apparent nausea or vomiting Anesthetic complications: no   No notable events documented.                Brennan Bailey

## 2021-02-06 NOTE — Progress Notes (Signed)
Orthopedic Tech Progress Note Patient Details:  Jac Romulus 12-08-1960 [id number]  Called in order to [facility] for a BKA SHRINKER   Patient ID: Majesty Oehlert, male   DOB: June 19, 1961, 60 y.o.   MRN: [id number]  Donald Pore 02/06/2021, 9:16 AM

## 2021-02-07 ENCOUNTER — Encounter (HOSPITAL_COMMUNITY): Payer: Self-pay | Admitting: Orthopedic Surgery

## 2021-02-07 ENCOUNTER — Inpatient Hospital Stay (HOSPITAL_COMMUNITY)
Admission: RE | Admit: 2021-02-07 | Discharge: 2021-02-16 | DRG: 560 | Disposition: A | Discharge status: Home Health | Payer: Medicare Other | Source: Intra-hospital | Attending: Physical Medicine and Rehabilitation | Admitting: Physical Medicine and Rehabilitation

## 2021-02-07 ENCOUNTER — Other Ambulatory Visit: Payer: Self-pay

## 2021-02-07 DIAGNOSIS — I5042 Chronic combined systolic (congestive) and diastolic (congestive) heart failure: Secondary | ICD-10-CM | POA: Diagnosis present

## 2021-02-07 DIAGNOSIS — D631 Anemia in chronic kidney disease: Secondary | ICD-10-CM | POA: Diagnosis present

## 2021-02-07 DIAGNOSIS — Z87891 Personal history of nicotine dependence: Secondary | ICD-10-CM

## 2021-02-07 DIAGNOSIS — Z955 Presence of coronary angioplasty implant and graft: Secondary | ICD-10-CM

## 2021-02-07 DIAGNOSIS — Z95810 Presence of automatic (implantable) cardiac defibrillator: Secondary | ICD-10-CM

## 2021-02-07 DIAGNOSIS — E1151 Type 2 diabetes mellitus with diabetic peripheral angiopathy without gangrene: Secondary | ICD-10-CM | POA: Diagnosis present

## 2021-02-07 DIAGNOSIS — I10 Essential (primary) hypertension: Secondary | ICD-10-CM | POA: Diagnosis not present

## 2021-02-07 DIAGNOSIS — Z91018 Allergy to other foods: Secondary | ICD-10-CM | POA: Diagnosis not present

## 2021-02-07 DIAGNOSIS — Z7984 Long term (current) use of oral hypoglycemic drugs: Secondary | ICD-10-CM

## 2021-02-07 DIAGNOSIS — Z833 Family history of diabetes mellitus: Secondary | ICD-10-CM

## 2021-02-07 DIAGNOSIS — E1122 Type 2 diabetes mellitus with diabetic chronic kidney disease: Secondary | ICD-10-CM | POA: Diagnosis present

## 2021-02-07 DIAGNOSIS — Z89512 Acquired absence of left leg below knee: Secondary | ICD-10-CM | POA: Diagnosis not present

## 2021-02-07 DIAGNOSIS — M109 Gout, unspecified: Secondary | ICD-10-CM | POA: Diagnosis present

## 2021-02-07 DIAGNOSIS — Z9981 Dependence on supplemental oxygen: Secondary | ICD-10-CM | POA: Diagnosis not present

## 2021-02-07 DIAGNOSIS — K5900 Constipation, unspecified: Secondary | ICD-10-CM | POA: Diagnosis not present

## 2021-02-07 DIAGNOSIS — K219 Gastro-esophageal reflux disease without esophagitis: Secondary | ICD-10-CM | POA: Diagnosis present

## 2021-02-07 DIAGNOSIS — J449 Chronic obstructive pulmonary disease, unspecified: Secondary | ICD-10-CM | POA: Diagnosis present

## 2021-02-07 DIAGNOSIS — N183 Chronic kidney disease, stage 3 unspecified: Secondary | ICD-10-CM | POA: Diagnosis present

## 2021-02-07 DIAGNOSIS — G479 Sleep disorder, unspecified: Secondary | ICD-10-CM | POA: Diagnosis not present

## 2021-02-07 DIAGNOSIS — Z885 Allergy status to narcotic agent status: Secondary | ICD-10-CM | POA: Diagnosis not present

## 2021-02-07 DIAGNOSIS — I130 Hypertensive heart and chronic kidney disease with heart failure and stage 1 through stage 4 chronic kidney disease, or unspecified chronic kidney disease: Secondary | ICD-10-CM | POA: Diagnosis present

## 2021-02-07 DIAGNOSIS — Z886 Allergy status to analgesic agent status: Secondary | ICD-10-CM

## 2021-02-07 DIAGNOSIS — E7800 Pure hypercholesterolemia, unspecified: Secondary | ICD-10-CM | POA: Diagnosis present

## 2021-02-07 DIAGNOSIS — I2510 Atherosclerotic heart disease of native coronary artery without angina pectoris: Secondary | ICD-10-CM | POA: Diagnosis present

## 2021-02-07 DIAGNOSIS — Z7951 Long term (current) use of inhaled steroids: Secondary | ICD-10-CM

## 2021-02-07 DIAGNOSIS — Z7982 Long term (current) use of aspirin: Secondary | ICD-10-CM

## 2021-02-07 DIAGNOSIS — R7309 Other abnormal glucose: Secondary | ICD-10-CM

## 2021-02-07 DIAGNOSIS — Z8249 Family history of ischemic heart disease and other diseases of the circulatory system: Secondary | ICD-10-CM

## 2021-02-07 DIAGNOSIS — Z4781 Encounter for orthopedic aftercare following surgical amputation: Principal | ICD-10-CM

## 2021-02-07 DIAGNOSIS — E1142 Type 2 diabetes mellitus with diabetic polyneuropathy: Secondary | ICD-10-CM | POA: Diagnosis present

## 2021-02-07 DIAGNOSIS — S88112A Complete traumatic amputation at level between knee and ankle, left lower leg, initial encounter: Secondary | ICD-10-CM

## 2021-02-07 DIAGNOSIS — Z79899 Other long term (current) drug therapy: Secondary | ICD-10-CM

## 2021-02-07 DIAGNOSIS — Z794 Long term (current) use of insulin: Secondary | ICD-10-CM

## 2021-02-07 LAB — GLUCOSE, CAPILLARY
Glucose-Capillary: 122 mg/dL — ABNORMAL HIGH (ref 70–99)
Glucose-Capillary: 99 mg/dL (ref 70–99)
Glucose-Capillary: 140 mg/dL — ABNORMAL HIGH (ref 70–99)
Glucose-Capillary: 128 mg/dL — ABNORMAL HIGH (ref 70–99)

## 2021-02-07 MED ORDER — GABAPENTIN 100 MG PO CAPS
100.0000 mg | ORAL_CAPSULE | Freq: Three times a day (TID) | ORAL | Status: DC
  Administered 2021-02-07 – 2021-02-13 (×18): 100 mg via ORAL
  Filled 2021-02-07 (×18): qty 1

## 2021-02-07 MED ORDER — CLOPIDOGREL BISULFATE 75 MG PO TABS
75.0000 mg | ORAL_TABLET | Freq: Every day | ORAL | Status: DC
  Administered 2021-02-08 – 2021-02-16 (×9): 75 mg via ORAL
  Filled 2021-02-07 (×9): qty 1

## 2021-02-07 MED ORDER — BISACODYL 5 MG PO TBEC: 5.0000 mg | DELAYED_RELEASE_TABLET | Freq: Every day | ORAL | Status: DC | PRN

## 2021-02-07 MED ORDER — INSULIN ASPART 100 UNIT/ML IJ SOLN
4.0000 [IU] | Freq: Three times a day (TID) | INTRAMUSCULAR | Status: DC
  Administered 2021-02-07 – 2021-02-16 (×24): 4 [IU] via SUBCUTANEOUS

## 2021-02-07 MED ORDER — JUVEN PO PACK
1.0000 | PACK | Freq: Two times a day (BID) | ORAL | Status: DC
  Administered 2021-02-08 – 2021-02-15 (×16): 1 via ORAL
  Filled 2021-02-07 (×13): qty 1

## 2021-02-07 MED ORDER — ALBUTEROL SULFATE (2.5 MG/3ML) 0.083% IN NEBU: 2.5000 mg | INHALATION_SOLUTION | RESPIRATORY_TRACT | Status: DC | PRN

## 2021-02-07 MED ORDER — ASCORBIC ACID 500 MG PO TABS
1000.0000 mg | ORAL_TABLET | Freq: Every day | ORAL | Status: DC
  Administered 2021-02-08 – 2021-02-16 (×9): 1000 mg via ORAL
  Filled 2021-02-07 (×10): qty 2

## 2021-02-07 MED ORDER — SACUBITRIL-VALSARTAN 24-26 MG PO TABS
1.0000 | ORAL_TABLET | Freq: Two times a day (BID) | ORAL | Status: DC
  Administered 2021-02-08 – 2021-02-16 (×15): 1 via ORAL
  Filled 2021-02-07 (×18): qty 1

## 2021-02-07 MED ORDER — ASPIRIN 81 MG PO CHEW
81.0000 mg | CHEWABLE_TABLET | Freq: Every day | ORAL | Status: DC
  Administered 2021-02-08 – 2021-02-16 (×9): 81 mg via ORAL
  Filled 2021-02-07 (×9): qty 1

## 2021-02-07 MED ORDER — ZINC SULFATE 220 (50 ZN) MG PO CAPS
220.0000 mg | ORAL_CAPSULE | Freq: Every day | ORAL | Status: DC
  Administered 2021-02-08 – 2021-02-16 (×9): 220 mg via ORAL
  Filled 2021-02-07 (×9): qty 1

## 2021-02-07 MED ORDER — ATORVASTATIN CALCIUM 40 MG PO TABS
40.0000 mg | ORAL_TABLET | Freq: Every evening | ORAL | Status: DC
  Administered 2021-02-07 – 2021-02-15 (×9): 40 mg via ORAL
  Filled 2021-02-07 (×9): qty 1

## 2021-02-07 MED ORDER — OXYCODONE HCL 5 MG PO TABS
5.0000 mg | ORAL_TABLET | ORAL | Status: DC | PRN
  Administered 2021-02-07 – 2021-02-14 (×3): 10 mg via ORAL
  Filled 2021-02-07 (×3): qty 2

## 2021-02-07 MED ORDER — MELATONIN 3 MG PO TABS
3.0000 mg | ORAL_TABLET | Freq: Every day | ORAL | Status: DC
  Administered 2021-02-07 – 2021-02-15 (×9): 3 mg via ORAL
  Filled 2021-02-07 (×9): qty 1

## 2021-02-07 MED ORDER — BISOPROLOL FUMARATE 5 MG PO TABS
2.5000 mg | ORAL_TABLET | Freq: Every day | ORAL | Status: DC
  Administered 2021-02-08 – 2021-02-16 (×9): 2.5 mg via ORAL
  Filled 2021-02-07 (×9): qty 0.5

## 2021-02-07 MED ORDER — SPIRONOLACTONE 12.5 MG HALF TABLET
12.5000 mg | ORAL_TABLET | Freq: Every day | ORAL | Status: DC
  Administered 2021-02-08 – 2021-02-16 (×9): 12.5 mg via ORAL
  Filled 2021-02-07 (×9): qty 1

## 2021-02-07 MED ORDER — EMPAGLIFLOZIN 10 MG PO TABS
10.0000 mg | ORAL_TABLET | Freq: Every day | ORAL | Status: DC
  Administered 2021-02-08 – 2021-02-16 (×9): 10 mg via ORAL
  Filled 2021-02-07 (×9): qty 1

## 2021-02-07 MED ORDER — HYDROCODONE-ACETAMINOPHEN 5-325 MG PO TABS
1.0000 | ORAL_TABLET | Freq: Four times a day (QID) | ORAL | Status: DC | PRN
  Filled 2021-02-07: qty 2

## 2021-02-07 MED ORDER — GUAIFENESIN-DM 100-10 MG/5ML PO SYRP
15.0000 mL | ORAL_SOLUTION | ORAL | Status: DC | PRN
Start: 2021-02-07 — End: 2021-02-16

## 2021-02-07 MED ORDER — SENNOSIDES-DOCUSATE SODIUM 8.6-50 MG PO TABS
2.0000 | ORAL_TABLET | Freq: Every day | ORAL | Status: DC
  Administered 2021-02-07 – 2021-02-15 (×8): 2 via ORAL
  Filled 2021-02-07 (×11): qty 2

## 2021-02-07 MED ORDER — DULOXETINE HCL 60 MG PO CPEP
60.0000 mg | ORAL_CAPSULE | Freq: Every day | ORAL | Status: DC
  Administered 2021-02-08 – 2021-02-16 (×9): 60 mg via ORAL
  Filled 2021-02-07 (×9): qty 1

## 2021-02-07 MED ORDER — ACETAMINOPHEN 325 MG PO TABS
650.0000 mg | ORAL_TABLET | ORAL | Status: DC | PRN
  Administered 2021-02-13 – 2021-02-15 (×5): 650 mg via ORAL
  Filled 2021-02-07 (×6): qty 2

## 2021-02-07 MED ORDER — DOCUSATE SODIUM 100 MG PO CAPS
100.0000 mg | ORAL_CAPSULE | Freq: Every day | ORAL | Status: DC
Start: 2021-02-08 — End: 2021-02-16
  Administered 2021-02-08 – 2021-02-16 (×9): 100 mg via ORAL
  Filled 2021-02-07 (×9): qty 1

## 2021-02-07 MED ORDER — TORSEMIDE 20 MG PO TABS
40.0000 mg | ORAL_TABLET | Freq: Every day | ORAL | Status: DC
  Administered 2021-02-08 – 2021-02-16 (×9): 40 mg via ORAL
  Filled 2021-02-07 (×10): qty 2

## 2021-02-07 MED ORDER — ALLOPURINOL 100 MG PO TABS
100.0000 mg | ORAL_TABLET | Freq: Every day | ORAL | Status: DC
  Administered 2021-02-08 – 2021-02-16 (×9): 100 mg via ORAL
  Filled 2021-02-07 (×8): qty 1

## 2021-02-07 MED ORDER — BUSPIRONE HCL 5 MG PO TABS
5.0000 mg | ORAL_TABLET | Freq: Two times a day (BID) | ORAL | Status: DC
  Administered 2021-02-07 – 2021-02-16 (×18): 5 mg via ORAL
  Filled 2021-02-07 (×18): qty 1

## 2021-02-07 MED ORDER — INSULIN GLARGINE-YFGN 100 UNIT/ML ~~LOC~~ SOLN
13.0000 [IU] | Freq: Two times a day (BID) | SUBCUTANEOUS | Status: DC
  Administered 2021-02-07 – 2021-02-16 (×19): 13 [IU] via SUBCUTANEOUS
  Filled 2021-02-07 (×19): qty 0.13

## 2021-02-07 MED ORDER — INSULIN ASPART 100 UNIT/ML IJ SOLN
0.0000 [IU] | Freq: Three times a day (TID) | INTRAMUSCULAR | Status: DC
  Administered 2021-02-07 – 2021-02-08 (×2): 2 [IU] via SUBCUTANEOUS

## 2021-02-07 MED ORDER — ISOSORBIDE MONONITRATE ER 30 MG PO TB24
30.0000 mg | ORAL_TABLET | Freq: Every day | ORAL | Status: DC
  Administered 2021-02-08 – 2021-02-16 (×9): 30 mg via ORAL
  Filled 2021-02-07 (×9): qty 1

## 2021-02-07 MED ORDER — PANTOPRAZOLE SODIUM 40 MG PO TBEC
40.0000 mg | DELAYED_RELEASE_TABLET | Freq: Two times a day (BID) | ORAL | Status: DC
  Administered 2021-02-07 – 2021-02-16 (×18): 40 mg via ORAL
  Filled 2021-02-07 (×18): qty 1

## 2021-02-07 MED ORDER — ADULT MULTIVITAMIN W/MINERALS CH
1.0000 | ORAL_TABLET | Freq: Every day | ORAL | Status: DC
  Administered 2021-02-08 – 2021-02-16 (×8): 1 via ORAL
  Filled 2021-02-07 (×9): qty 1

## 2021-02-07 MED ORDER — POLYETHYLENE GLYCOL 3350 17 G PO PACK: 17.0000 g | PACK | Freq: Every day | ORAL | Status: DC | PRN

## 2021-02-07 NOTE — Progress Notes (Signed)
Physical Therapy Treatment Patient Details Name: Richard [name] MRN: [id number] DOB: 1960-12-17 Today's Date: 02/07/2021    History of Present Illness Pt is a 60 y.o. 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 S/p RLE angioplasty 8/9. 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 PMH includes DM2, COPD (on 2L O2 baseline), CHF, AICD, CKD, HTN.    PT Comments    Patient received in bed, pleasant, agreeable to PT session. He verbalized that he cannot stand on right LE. Bed exercises performed this session. He did well, has some weakness in B LE especially in B Hamstrings. Patient is mod independent with bed mobility. Patient limited by new NWB on right LE and amputation on left. He will continue to benefit from skilled PT while here to improve mobility, strength and safety.     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    Mobility  Bed Mobility Overal bed mobility: Modified Independent Bed Mobility: Supine to Sit;Sit to Supine     Supine to sit: Modified independent (Device/Increase time) Sit to supine: Modified independent (Device/Increase time)   General bed mobility comments: caution to not push  with R UE much as he just had picc line removed.    Transfers Overall transfer level: Needs assistance   Transfers: Lateral/Scoot Transfers          Lateral/Scoot Transfers: Min assist General transfer comment: not assessed this session  Ambulation/Gait                 Stairs             Wheelchair Mobility    Modified Rankin (Stroke Patients Only)       Balance Overall balance assessment: Needs assistance Sitting-balance support: No upper extremity supported;Feet supported Sitting balance-Leahy Scale: Fair                                      Cognition Arousal/Alertness: Awake/alert Behavior During Therapy: WFL for tasks assessed/performed Overall Cognitive Status: Within Functional Limits for tasks assessed Area of Impairment: Attention;Following commands;Safety/judgement;Awareness;Problem solving;Memory                   Current Attention Level: Selective Memory: Decreased short-term memory Following Commands: Follows one step commands consistently;Follows one step commands with increased time Safety/Judgement: Decreased awareness of safety;Decreased awareness of deficits Awareness: Emergent Problem Solving: Slow processing;Requires verbal cues General Comments: patient reports he is not able to put weight on foot.      Exercises Other Exercises Other Exercises: B LE strengthening exercises: R AP, R heel slides, SLR, Hip abd/add, LAQ, marching quad sets. x 10 reps. Patient shaking with eccentric movement when doing LAQ (weak hamstrings?)  General Comments General comments (skin integrity, edema, etc.): reviewed positoining of L LE straight, pt requesting to doff limb protector after transfer to chair      Pertinent Vitals/Pain Pain Assessment: Faces Faces Pain Scale: Hurts a little bit Pain Location: tightness, sore in hamstrings Pain Descriptors / Indicators: Discomfort;Tightness Pain Intervention(s): Monitored  during session    Home Living                      Prior Function            PT Goals (current goals can now be found in the care plan section) Acute Rehab PT Goals Patient Stated Goal: Go home PT Goal Formulation: With patient Time For Goal Achievement: 02/19/21 Potential to Achieve Goals: Good Progress towards PT goals: Progressing toward goals    Frequency    Min 3X/week      PT Plan Current plan remains appropriate    Co-evaluation              AM-PAC PT "6 Clicks" Mobility   Outcome Measure  Help needed turning from your back to your side while in a flat bed without using bedrails?: A Little Help needed moving from lying on your back to sitting on the side of a flat bed without using bedrails?: A Little Help needed moving to and from a bed to a chair (including a wheelchair)?: A Little Help needed standing up from a chair using your arms (e.g., wheelchair or bedside chair)?: Total Help needed to walk in hospital room?: Total Help needed climbing 3-5 steps with a railing? : Total 6 Click Score: 12    End of Session   Activity Tolerance: Patient tolerated treatment well;Patient limited by fatigue Patient left: in bed;with call bell/phone within reach;with bed alarm set;with nursing/sitter in room Nurse Communication: Mobility status PT Visit Diagnosis: Other abnormalities of gait and mobility (R26.89);Muscle weakness (generalized) (M62.81)     Time: 4827-0786 PT Time Calculation (min) (ACUTE ONLY): 17 min  Charges:  $Therapeutic Exercise: 8-22 mins                    [employer], PT, GCS 02/07/21,1:12 PM

## 2021-02-07 NOTE — Progress Notes (Signed)
Signed                                                                                                                                                                                                                                                                                                                                                                                                                                                                                                                          PMR Admission Coordinator Pre-Admission Assessment   Patient: Richard [name] is an 60 y.o., male MRN: [id number] DOB: [birth year] Height:   Weight: 106.6 kg   Insurance Information HMO:     PPO:      PCP:      IPA:      80/20: yes     OTHER:  PRIMARY: Medicare A & B      Policy#: [id number]       Subscriber: patient CM Name:       Phone#:  Fax#:  Pre-Cert#:       Employer:  Benefits:  Phone #: verified online via [employer] on 02/05/21     Name:  Eff. Date: Part A & B effective 09/24/04     Deduct: $1,556      Out of Pocket Max: NA      Life Max: NA CIR: 100%      SNF: 100% days 1-20, 80% days 21-100 Outpatient: 80%     Co-Pay: 2% Home Health: 100%      Co-Pay:  DME: 80%     Co-Pay: 20% Providers: pt's choice SECONDARY: Medicaid of [place]      Policy#: [id number]     Phone#:    Financial Counselor:       Phone#:    The "Data Collection Information Summary" for patients in Inpatient Rehabilitation Facilities with attached "Privacy Act [place] Records" was provided and verbally reviewed with: Patient   Emergency Contact Information Contact Information       Name Relation Home Work Mobile    [place]     [phone]    [place] Mother [phone]        [place],Dorothy Relative [phone]                Current Medical History  Patient Admitting Diagnosis: L BKA, 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 Pt admitted to hospital on 01/15/21 with body aches and SOB. Workup for CHF vs COPD exacerbation. 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 TEE was negative. 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 Pt admitted to CIR on 01/27/21. Vascular was consulted d/t blister on pt's right foot. Aortogram with right lower extremity arteriogram was recommended.  Pt re-admitted to acute hospital on 02/01/21 for revascularization to right lower extremity.  Ortho evaluated right foot wounds and recommended right first and second ray amputation. Pt underwent right foot first and second ray amputation on 02/04/21.  Therapy evaluations completed and CIR recommended d/t pt's deficits in functional mobility.    [name] medical record from [facility] has been reviewed by the rehabilitation admission coordinator and physician.   Past Medical History      Past Medical History:  Diagnosis Date   AICD (automatic cardioverter/defibrillator) present 01/11/2017   Anxiety     CHF (congestive heart failure) (HCC)     CKD (chronic kidney disease)     COPD (chronic obstructive pulmonary disease) ([place])      never smoker, industrial exposure   Diabetes mellitus with diabetic neuropathy, with long-term current use of insulin (HCC)     GERD (gastroesophageal reflux disease)     High cholesterol     History of gout      "take RX qd" (11/01/2017)   Hypertension     Nonobstructive atherosclerosis of coronary artery     On home oxygen therapy      "2L prn" (11/01/2017)   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   Prior Rehab/Hospitalizations Has the patient had prior rehab or hospitalizations prior to admission? Yes   Has the patient had major surgery during 100 days prior to admission? Yes              Current Medications   Current Facility-Administered Medications:    0.9 %  sodium chloride infusion, 250 mL, Intravenous, PRN, Persons, Bevely Palmer, PA   0.9 %  sodium chloride infusion, , Intravenous, Continuous, Persons, Bevely Palmer, [place], Stopped at 02/06/21 2156   acetaminophen (TYLENOL) tablet 650 mg, 650 mg, Oral, Q4H PRN, Persons, Bevely Palmer, PA, 650 mg at 02/06/21 2151   albuterol (PROVENTIL) (2.5 MG/3ML) 0.083% nebulizer solution 2.5 mg, 2.5 mg, Nebulization, Q4H PRN, Persons, Bevely Palmer, PA   allopurinol (ZYLOPRIM) tablet 100 mg, 100 mg, Oral, Daily, Persons, Bevely Palmer, PA, 100 mg at 02/07/21 0813   alum & mag hydroxide-simeth (MAALOX/MYLANTA) 200-200-20 MG/5ML suspension 15-30 mL, 15-30 mL, Oral, Q2H PRN, Persons, Bevely Palmer, PA   ascorbic acid (VITAMIN C) tablet 1,000 mg, 1,000 mg, Oral, Daily, Persons, Bevely Palmer, PA, 1,000 mg at 02/07/21 0813   aspirin chewable tablet 81 mg, 81 mg, Oral, Daily, Persons, Bevely Palmer, PA, 81 mg at 02/07/21 1610   atorvastatin (LIPITOR) tablet 40 mg, 40 mg, Oral, QPM, Persons, Bevely Palmer, PA, 40 mg at 02/06/21 1811   bisacodyl (DULCOLAX) EC tablet 5 mg, 5 mg, Oral, Daily PRN, Persons, Bevely Palmer, PA   bisoprolol (ZEBETA) tablet 2.5 mg, 2.5 mg, Oral, Daily, Persons, Bevely Palmer, PA, 2.5 mg at 02/07/21 0813   busPIRone (BUSPAR) tablet 5 mg, 5 mg, Oral, BID, Persons, Bevely Palmer, PA, 5 mg at 02/07/21 0813   Chlorhexidine Gluconate Cloth 2 % PADS 6 each, 6 each, Topical, Daily, Persons, Bevely Palmer, [place], 6 each at 02/06/21 1347   clopidogrel (PLAVIX) tablet 75 mg, 75 mg, Oral,  Daily, Persons, Bevely Palmer, PA, 75 mg at 02/07/21 0813   docusate sodium (COLACE) capsule 100 mg, 100 mg, Oral, Daily, Persons, Bevely Palmer, PA, 100 mg at 02/07/21 0813   DULoxetine (CYMBALTA) DR capsule 60 mg, 60 mg, Oral, Daily, Persons, Bevely Palmer, PA, 60 mg at 02/07/21 0813   empagliflozin (JARDIANCE) tablet 10 mg, 10 mg, Oral, Daily, Persons, Bevely Palmer, PA, 10 mg at 02/07/21 9604   gabapentin (NEURONTIN) capsule 100 mg, 100 mg, Oral, TID, Persons, Bevely Palmer, PA, 100 mg at 02/07/21 0813   guaiFENesin-dextromethorphan (ROBITUSSIN DM) 100-10 MG/5ML syrup 15 mL, 15 mL, Oral, Q4H PRN, Persons, Bevely Palmer, PA   hydrALAZINE (APRESOLINE) injection 5 mg, 5 mg, Intravenous, Q20 Min PRN, Persons, Bevely Palmer, PA   HYDROcodone-acetaminophen (NORCO/VICODIN) 5-325 MG per tablet 1-2 tablet, 1-2 tablet, Oral, Q6H PRN, Persons, Bevely Palmer, [place], 2 tablet at 02/06/21 0325   HYDROmorphone (DILAUDID) injection 0.5 mg, 0.5 mg, Intravenous, Q4H PRN, Persons, Bevely Palmer, PA   insulin aspart (novoLOG) injection 0-15 Units, 0-15 Units, Subcutaneous, TID WC, Persons, Bevely Palmer, [place], 2 Units at 02/07/21 0816   insulin aspart (novoLOG) injection 0-5 Units, 0-5 Units, Subcutaneous, QHS, Persons, Bevely Palmer, [place], 2 Units at 02/04/21 2142   insulin aspart (novoLOG) injection 4 Units, 4 Units, Subcutaneous, TID WC, Persons, Bevely Palmer, [place], 4 Units at 02/07/21 0814   insulin glargine-yfgn (SEMGLEE) injection 13 Units, 13 Units, Subcutaneous, BID, Persons, Bevely Palmer, [place], 13 Units at 02/07/21 0815   isosorbide mononitrate (IMDUR) 24 hr tablet 30 mg, 30 mg, Oral, Daily, Persons, Bevely Palmer,  PA, 30 mg at 02/07/21 0813   labetalol (NORMODYNE) injection 10 mg, 10 mg, Intravenous, Q10 min PRN, Persons, Bevely Palmer, PA   melatonin tablet 3 mg, 3 mg, Oral, QHS, Serafina Mitchell, MD, 3 mg at 02/06/21 2151   morphine 4 MG/ML injection 2 mg, 2 mg, Intravenous, Q1H PRN, Persons, Bevely Palmer, PA   multivitamin with minerals tablet 1 tablet, 1 tablet, Oral,  Daily, Persons, Bevely Palmer, [place], 1 tablet at 02/07/21 0813   nutrition supplement (JUVEN) (JUVEN) powder packet 1 packet, 1 packet, Oral, BID BM, Persons, Bevely Palmer, [place], 1 packet at 02/07/21 0815   ondansetron (ZOFRAN) injection 4 mg, 4 mg, Intravenous, Q6H PRN, Persons, Bevely Palmer, PA, 4 mg at 02/05/21 0800   oxyCODONE (Oxy IR/ROXICODONE) immediate release tablet 5-10 mg, 5-10 mg, Oral, Q4H PRN, Persons, Bevely Palmer, PA   pantoprazole (PROTONIX) EC tablet 40 mg, 40 mg, Oral, BID, Persons, Bevely Palmer, PA, 40 mg at 02/07/21 0813   phenol (CHLORASEPTIC) mouth spray 1 spray, 1 spray, Mouth/Throat, PRN, Persons, Bevely Palmer, PA   polyethylene glycol (MIRALAX / GLYCOLAX) packet 17 g, 17 g, Oral, Daily PRN, Persons, Bevely Palmer, PA   sacubitril-valsartan (ENTRESTO) 24-26 mg per tablet, 1 tablet, Oral, BID, Persons, Bevely Palmer, PA, 1 tablet at 02/06/21 1100   senna-docusate (Senokot-S) tablet 2 tablet, 2 tablet, Oral, QPC supper, Persons, Bevely Palmer, [place], 2 tablet at 02/06/21 1810   sodium chloride flush (NS) 0.9 % injection 3 mL, 3 mL, Intravenous, Q12H, Persons, Bevely Palmer, PA, 3 mL at 02/07/21 0817   sodium chloride flush (NS) 0.9 % injection 3 mL, 3 mL, Intravenous, PRN, Persons, Bevely Palmer, PA   spironolactone (ALDACTONE) tablet 12.5 mg, 12.5 mg, Oral, Daily, Persons, Bevely Palmer, PA, 12.5 mg at 02/07/21 7253   torsemide (DEMADEX) tablet 40 mg, 40 mg, Oral, Daily, Persons, Bevely Palmer, PA, 40 mg at 02/07/21 6644   zinc sulfate capsule 220 mg, 220 mg, Oral, Daily, Persons, Bevely Palmer, PA, 220 mg at 02/07/21 0813   Patients Current Diet:  Diet Order                  Diet Carb Modified Fluid consistency: Thin; Room service appropriate? Yes  Diet effective now                         Precautions / Restrictions Precautions Precautions: Fall, Other (comment) Precaution Comments: Recent 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); R foot portable wound vac Other Brace: L BKA limb guard (notably soiled, cleaned as best as I could during  session 8/13, will let dry; pink foam still with crusty blood); R foot post-op shoe Restrictions Weight Bearing Restrictions: Yes RLE Weight Bearing: Touchdown weight bearing LLE Weight Bearing: Non weight bearing Other Position/Activity Restrictions: Per MD note for R foot: "Weightbearing: Ideally nonweightbearing on the right but touchdown weightbearing on the heel would be acceptable".    Has the patient had 2 or more falls or a fall with injury in the past year? Yes   Prior Activity Level Limited Community (1-2x/wk): MD appointments   Prior Functional Level Self Care: Did the patient need help bathing, dressing, using the toilet or eating? Independent   Indoor Mobility: Did the patient need assistance with walking from room to room (with or without device)? Independent   Stairs: Did the patient need assistance with internal or external stairs (with or without device)? Needed some help   Functional Cognition: Did the patient need help  planning regular tasks such as shopping or remembering to take medications? Needed some help   Home Assistive Devices / Equipment Home Assistive Devices/Equipment: Nebulizer Home Equipment: Cane - single point   Prior Device Use: Indicate devices/aids used by the patient prior to current illness, exacerbation or injury? None of the above   Current Functional Level Cognition   Overall Cognitive Status: No family/caregiver present to determine baseline cognitive functioning Current Attention Level: Sustained, Selective Orientation Level: Oriented X4 Following Commands: Follows one step commands consistently Safety/Judgement: Decreased awareness of safety, Decreased awareness of deficits    Extremity Assessment (includes Sensation/Coordination)   Upper Extremity Assessment: Generalized weakness  Lower Extremity Assessment: Generalized weakness, RLE deficits/detail, LLE deficits/detail RLE Deficits / Details: s/p R 1-2 ray amputations, wrapped with  wound vac; hip and knee functionally at least 3/5 strength; pt reports little to no sensation in lower leg/foot; chronic lower leg wounds RLE Sensation: decreased light touch, history of peripheral neuropathy RLE Coordination: decreased fine motor LLE Deficits / Details: s/p recent 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); able to perform straight leg raise, good knee ROM     ADLs   Eating/Feeding: Independent, Sitting Grooming: Wash/dry face, Wash/dry hands, Set up, Sitting Upper Body Bathing: Set up, Sitting Lower Body Bathing: Moderate assistance, Sitting/lateral leans, Minimal assistance Upper Body Dressing : Sitting, Set up Lower Body Dressing: Sitting/lateral leans, Moderate assistance     Mobility   Overal bed mobility: Needs Assistance Bed Mobility: Supine to Sit Supine to sit: Mod assist, HOB elevated Sit to supine: Supervision General bed mobility comments: ModA for HHA to elevate trunk     Transfers   Overall transfer level: Needs assistance Equipment used: None Transfers: Lateral/Scoot Transfers Sit to Stand: Min assist  Lateral/Scoot Transfers: Min assist General transfer comment: Deferred standing/pivot transfer secondary to WB precautions ("Ideally NWB, or TDWB through heel only"); pt requires assist for lateral scoot transfer to recliner, minA for safety, cues to minimize WB through R foot     Ambulation / Gait / Stairs / Wheelchair Mobility         Posture / Balance     Dynamic Sitting Balance Sitting balance - Comments: Required assist to don R foot post op shoe Balance Overall balance assessment: Needs assistance Sitting-balance support: No upper extremity supported, Feet supported Sitting balance-Leahy Scale: Fair Sitting balance - Comments: Required assist to don R foot post op shoe Standing balance support: Bilateral upper extremity supported Standing balance-Leahy Scale: Poor Standing balance comment: Reliant on BUE support and external assist     Special needs/care  consideration Continuous Drip IV  0.9% sodium chloride infusion, Wound Vac right foot, Skin Amputation: left leg; Blister: right foot; Cracking: left leg; surgical incision: right foot/left leg-lower left BKA stump; Non-pressure wound: pretibial right small oval like opening in anterior aspect of tibia, Diabetic management novoLOG 4 units 3 times daily at meals;novoLOG 0-5 units daily at bedtime; novoLOG 0-15 units 3 times daily at meals , and Designated visitor [place] (from acute therapy documentation) Living Arrangements: Spouse/significant other, Children  Lives With: Spouse, Daughter, Son, Other (Comment) Available Help at Discharge: Family, Available PRN/intermittently Type of Home: House Home Layout: Two level, Able to live on main level with bedroom/bathroom, Full bath on main level Home Access: Stairs to enter Entrance Stairs-Rails: Can reach both Entrance Stairs-Number of Steps: 4 in front, 3 on side Bathroom Shower/Tub: Tub/shower unit, Multimedia programmer: Standard Bathroom Accessibility: Yes How Accessible: Accessible via walker  Home Care Services: No Additional Comments: Per chart:  Pt reports family will not assist him much.   Discharge Living Setting Plans for Discharge Living Setting: [name] home Type of Home at Discharge: House Discharge Home Layout: Multi-level, Able to live on main level with bedroom/bathroom Discharge Home Access: Stairs to enter Entrance Stairs-Rails: Can reach both (rails by steps in front. No rail by steps on side of house) Entrance Stairs-Number of Steps: 4 steps in front, 3 steps on side Discharge Bathroom Shower/Tub: Tub/shower unit Discharge Bathroom Toilet: Standard Discharge Bathroom Accessibility: Yes How Accessible: Accessible via walker Does the patient have any problems obtaining your medications?: No   Social/Family/Support Systems Patient Roles: Parent Anticipated Caregiver: self/[name]  and other family Anticipated Caregiver's Contact Information: [phone]; [phone] Ability/Limitations of Caregiver: family can provide supervision level assistance and light physical assistance Caregiver Availability: Intermittent Discharge Plan Discussed with Primary Caregiver: Yes Is Caregiver In Agreement with Plan?: Yes Does Caregiver/Family have Issues with Lodging/Transportation while Pt is in Rehab?: No     Goals Patient/Family Goal for Rehab: PT/OT Supervision Expected length of stay: 14-16 days Pt/Family Agrees to Admission and willing to participate: Yes Program Orientation Provided & Reviewed with Pt/Caregiver Including Roles  & Responsibilities: Yes   Decrease burden of Care through IP rehab admission: Specialzed equipment needs, Decrease number of caregivers, Bowel and bladder program, and Patient/family education   Possible need for SNF placement upon discharge: Not anticipated   Patient Condition: I have reviewed medical records from [facility], spoken with CM, and patient. I met with patient at the bedside for inpatient rehabilitation assessment.  Patient will benefit from ongoing PT and OT, can actively participate in 3 hours of therapy a day 5 days of the week, and can make measurable gains during the admission.  Patient will also benefit from the coordinated team approach during an Inpatient Acute Rehabilitation admission.  The patient will receive intensive therapy as well as Rehabilitation physician, nursing, social worker, and care management interventions.  Due to safety, skin/wound care, disease management, medication administration, pain management, and patient education the patient requires 24 hour a day rehabilitation nursing.  The patient is currently Supervision-Mod A with mobility and Set up-Mod A with basic ADLs.  Discharge setting and therapy post discharge at home with home health is anticipated.  Patient has agreed to participate in the  Acute Inpatient Rehabilitation Program and will admit today.   Preadmission Screen Completed By:  Bethel Born, 02/07/2021 11:31 AM ______________________________________________________________________   Discussed status with Dr. Letta Pate on 02/07/21  at 11:31 AM and received approval for admission today.   Admission Coordinator:  Bethel Born, CCC-SLP, time 11:31 AM/Date 02/07/21     Assessment/Plan: Diagnosis:  Left trans tib amp 01/21/21, Right 1st and 2nd ray amp 01/31/21 Does the need for close, 24 hr/day Medical supervision in concert with the patient's rehab needs make it unreasonable for this patient to be served in a less intensive setting? Yes Co-Morbidities requiring supervision/potential complications: CKD 3, HLD, hx COPD Due to bladder management, bowel management, safety, skin/wound care, disease management, medication administration, pain management, and patient education, does the patient require 24 hr/day rehab nursing? Yes Does the patient require coordinated care of a physician, rehab nurse, PT, OT, and SLP to address physical and functional deficits in the context of the above medical diagnosis(es)? Yes Addressing deficits in the following areas: balance, endurance, locomotion, strength, transferring, bowel/bladder control, bathing, dressing, feeding, grooming, toileting, and psychosocial support Can  the patient actively participate in an intensive therapy program of at least 3 hrs of therapy 5 days a week? Yes The potential for patient to make measurable gains while on inpatient rehab is good Anticipated functional outcomes upon discharge from inpatient rehab: supervision PT, supervision OT, n/a SLP Estimated rehab length of stay to reach the above functional goals is: 14-16d Anticipated discharge destination: Home 10. Overall Rehab/Functional Prognosis: good     MD Signature: Charlett Blake M.D. [facility] Fellow Am Acad of Phys  Med and Rehab Diplomate Am Board of Electrodiagnostic Med Fellow Am Board of Interventional Pain

## 2021-02-07 NOTE — Progress Notes (Signed)
Patient is status post amputation.  He is doing well.  He is anxious to transfer back to rehab if this is an option.  From an orthopedic standpoint he can discharge when bed availability

## 2021-02-07 NOTE — Progress Notes (Signed)
RA DL PICC removed per protocol per MD order. Manual pressure applied for 5 mins. Vaseline gauze, gauze, and Tegaderm applied over insertion site. No bleeding or swelling noted. Instructed patient to remain in bed for thirty mins. Educated patient about S/S of infection and when to call MD; no heavy lifting or pressure on right side for 24 hours; keep dressing dry and intact for 24 hours. Pt verbalized comprehension. 

## 2021-02-07 NOTE — Progress Notes (Signed)
Inpatient Rehabilitation Medication Review by a Pharmacist  A complete drug regimen review was completed for this patient to identify any potential clinically significant medication issues.  High Risk Drug Classes Is patient taking? Indication by Medication  Antipsychotic No   Anticoagulant No   Antibiotic No   Opioid Yes Acute pain  Antiplatelet Yes Peripheral vascular disease  Hypoglycemics/insulin Yes Diabetes  Vasoactive Medication Yes Heart failure  Chemotherapy No   Other Yes Gout     Type of Medication Issue Identified Description of Issue Recommendation(s)  Drug Interaction(s) (clinically significant)     Duplicate Therapy     Allergy     No Medication Administration End Date     Incorrect Dose     Additional Drug Therapy Needed  Insulin aspart 0-5 QHS sliding scale not resumed  Consider resuming if needed   Significant med changes from prior encounter (inform family/care partners about these prior to discharge). These medications have been discontinued from 7/24 PTA med list: digoxin, eplerenone, fenofibrate, hydralazine, isosorbide dinitrate, Vascepa, insulin U-500, magnesium oxide, pregabalin, Breo-ellipta  New medications: clopidogrel, gabapentin, isosorbide mononitrate, insulin glargine, prn Norco, PRN oxycodone  Consider resuming if appropriate. Inform patient of changes at discharge.   Other       Clinically significant medication issues were identified that warrant physician communication and completion of prescribed/recommended actions by midnight of the next day:  Yes  Name of provider notified for urgent issues identified: Deatra Ina  Provider Method of Notification: Secure chat    Time spent performing this drug regimen review (minutes):  20 min   Alphia Moh, PharmD, BCPS, [facility] Clinical Pharmacist  Please check AMION for all [facility] phone numbers After 10:00 PM, call Main Pharmacy [phone]

## 2021-02-07 NOTE — TOC Transition Note (Signed)
Transition of Care (TOC) - CM/SW Discharge Note Donn Pierini RN, BSN Transitions of Care Unit 4E- RN Case Manager See Treatment Team for direct phone #    Patient Details  Name: Richard [name] MRN: [id number] Date of Birth: Aug 30, 1960  Transition of Care [facility]) CM/SW Contact:  Darrold Span, RN Phone Number: 02/07/2021, 2:53 PM   Clinical Narrative:    Have been notified by CIR admission coordinator that pt has a bed for INPT rehab today and can transition later today.  Bedside RN to notify attending of CIR bed availability for today Per MD pt is stable to transition to INPT rehab.    Final next level of care: IP Rehab Facility Barriers to Discharge: No Barriers Identified   Patient Goals and CMS Choice Patient states their goals for this hospitalization and ongoing recovery are:: return to rehab CMS Medicare.gov Compare Post Acute Care list provided to:: Patient Choice offered to / list presented to : Patient  Discharge Placement               [facility] INPT rehab        Discharge Plan and Services In-house Referral: Clinical Social Work Discharge Planning Services: CM Consult Post Acute Care Choice: IP Rehab                      HH [facility]        Social Determinants of Health (SDOH) Interventions     Readmission Risk Interventions Readmission Risk Prevention Plan 02/07/2021  Transportation Screening Complete  PCP or Specialist appointment within 3-5 days of discharge Not Complete  PCP/Specialist Appt Not Complete comments returning to INPT rehab  HRI or Home Care Consult Complete  SW Recovery Care/Counseling Consult Complete  Palliative Care Screening Not Applicable  Skilled Nursing Facility Not Applicable  Some recent data might be hidden

## 2021-02-07 NOTE — Care Management Important Message (Signed)
Important Message  Patient Details  Name: Richard [name] MRN: [id number] Date of Birth: September 17, 1960   Medicare Important Message Given:  Yes     Renie Ora 02/07/2021, 9:42 AM

## 2021-02-07 NOTE — Progress Notes (Signed)
Inpatient Rehab Admissions Coordinator:  There is a bed for pt in CIR to admit today.  [place] Persons, PA made aware and in agreement. PT, NSG, and TOC made aware.   Wolfgang Phoenix, MS, CCC-SLP Admissions Coordinator [phone]

## 2021-02-07 NOTE — Progress Notes (Signed)
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

## 2021-02-07 NOTE — Progress Notes (Signed)
Occupational Therapy Treatment Patient Details Name: Richard [name] MRN: [id number] DOB: 1961-01-30 Today's Date: 02/07/2021    History of present illness Pt is a 60 y.o. 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 S/p RLE angioplasty 8/9. 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 PMH includes DM2, COPD (on 2L O2 baseline), CHF, AICD, CKD, HTN.   OT comments  Patient supine in bed and agreeable to OT. Reviewed precautions and educated on why he can't walk at this time.  Transferred to recliner with min assist, lateral scoot given cueing for safety, technique and precaution adherence.  He completed 2 sets 5 reps chair pushups for UE strength, grooming with setup and LB ADLs with mod assist.  Will follow acutely, CIR remains appropriate.    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       Mobility Bed Mobility Overal bed mobility: Needs Assistance Bed Mobility: Supine to Sit     Supine to sit: Min guard     General bed mobility comments: for safety    Transfers Overall transfer level: Needs assistance    Transfers: Lateral/Scoot Transfers          Lateral/Scoot Transfers: Min assist General transfer comment: min assist for safety, cueing for technique and to adhere to R LE precautions; deferred standing    Balance Overall balance assessment: Needs assistance Sitting-balance support: No upper extremity supported;Feet supported Sitting balance-Leahy Scale: Fair                                     ADL either performed or assessed with clinical judgement   ADL Overall ADL's : Needs assistance/impaired     Grooming: Set up;Sitting               Lower Body Dressing: Moderate assistance;Sitting/lateral leans Lower Body Dressing Details (indicate cue type and reason): to don R shoe, limb protector Toilet Transfer: Minimal assistance Toilet Transfer Details (indicate cue type and reason): lateral scoot transfer to recliner                 Vision       Perception     Praxis      Cognition Arousal/Alertness: Awake/alert Behavior During Therapy: Flat affect Overall Cognitive Status: No family/caregiver present to determine baseline cognitive functioning Area of Impairment: Attention;Following commands;Safety/judgement;Awareness;Problem solving;Memory                   Current Attention Level: Selective Memory: Decreased short-term memory Following Commands: Follows one step commands consistently;Follows one step commands with increased time Safety/Judgement: Decreased awareness of safety;Decreased awareness of deficits Awareness: Emergent Problem Solving: Slow processing;Requires verbal cues General Comments: pt  reports "walking to the bathroom" and requires further education on precautions to R LE even though "it doesn't hurt".  Poor awareness of safety and requires cueing to adhere to heel touch down only (remains unclear if he walked- asked again and reports he walked right after surgery, but he used the [facility] this morning)         Exercises     Shoulder Instructions       General Comments reviewed positoining of L LE straight, pt requesting to doff limb protector after transfer to chair    Pertinent Vitals/ Pain       Pain Assessment: No/denies pain  Home Living                                          Prior Functioning/Environment              Frequency  Min 2X/week        Progress Toward Goals  OT Goals(current goals can now be found in the care plan section)  Progress towards OT goals: Progressing toward goals  Acute Rehab OT Goals Patient Stated Goal: Go home OT Goal Formulation: With patient  Plan Discharge plan remains appropriate;Frequency remains appropriate    Co-evaluation                 AM-PAC OT "6 Clicks" Daily Activity     Outcome Measure   Help from another person eating meals?: None Help from another person taking care of personal grooming?: A Little Help from another person toileting, which includes using toliet, bedpan, or urinal?: A Lot Help from another person bathing (including washing, rinsing, drying)?: A Lot Help from another person to put on and taking off regular upper body clothing?: A Little Help from another person to put on and taking off regular lower body clothing?: A Lot 6 Click Score: 16    End of Session    OT Visit Diagnosis: Unsteadiness on feet (R26.81);Other abnormalities of gait and mobility (R26.89);Muscle weakness (generalized) (M62.81);Pain   Activity Tolerance Patient tolerated treatment well   Patient Left in chair;with call bell/phone within reach;with chair alarm set   Nurse Communication Mobility status;Weight bearing status        Time: 0946-1010 OT Time Calculation (min): 24 min  Charges: OT General Charges $OT Visit: 1 Visit OT Treatments $Self Care/Home Management : 23-37 mins  Barry Brunner, OT Acute Rehabilitation Services Pager [phone] Office [phone]    Richard [name] 02/07/2021, 11:33 AM

## 2021-02-07 NOTE — Progress Notes (Signed)
[facility]  Subjective  - No new complaints   Objective 102/60 94 98.7 F (37.1 C) (Oral) 19 93%  Intake/Output Summary (Last 24 hours) at 02/07/2021 0730 Last data filed at 02/07/2021 0710 Gross per 24 hour  Intake 3823.31 ml  Output 2875 ml  Net 948.31 ml    Right foot dressing in place with incisional wound vac to suction Doppler brisk PT signal Left groin soft without hematoma  Assessment/Planning: 02/01/21 Angioplasty, right posterior tibial and tibioperoneal trunk  Good doppler signal PT right foot  02/04/21 Right Foot Osteomyelitis, Abscess S/P 1st and 2nd ray amputation by DR. Duda  Readmission to CIR pending bed availability and physiatrist approval.    Richard [name] 02/07/2021 7:30 AM --  Laboratory Lab Results: Recent Labs    02/05/21 0508 02/06/21 0500  WBC 11.6* 12.3*  HGB 13.5 11.8*  HCT 42.5 36.0*  PLT 232 200   BMET Recent Labs    02/05/21 0508 02/06/21 0500  NA 134* 133*  K 4.5 4.1  CL 100 100  CO2 27 26  GLUCOSE 185* 105*  BUN 27* 24*  CREATININE 1.06 0.98  CALCIUM 8.8* 8.3*    COAG Lab Results  Component Value Date   INR 1.12 11/01/2017   No results found for: PTT

## 2021-02-07 NOTE — Discharge Instructions (Addendum)
Inpatient Rehab Discharge Instructions  Richard [name] Discharge date and time: No discharge date for patient encounter.   Activities/Precautions/ Functional Status: Activity: activity as tolerated Diet: diabetic diet Wound Care: Stump care antibacterial soap/Dial and water daily Functional status:  ___ No restrictions     ___ Walk up steps independently ___ 24/7 supervision/assistance   ___ Walk up steps with assistance ___ Intermittent supervision/assistance  ___ Bathe/dress independently ___ Walk with walker     __x_ Bathe/dress with assistance ___ Walk Independently    ___ Shower independently ___ Walk with assistance    ___ Shower with assistance ___ No alcohol     ___ Return to work/school ________  Special Instructions: No driving smoking or alcohol  Right foot skin care cleanse with antibacterial soap daily apply dry gauze and Ace wrap daily as needed  Left BKA antibacterial soap daily dry gauze and Curlex and stump shrinker    COMMUNITY REFERRALS UPON DISCHARGE:    Home Health:   PT, OT RN                   Agency:[facility] Phone:[phone]   Medical Equipment/Items Ordered: Wheelchair-stall medical [phone]   drop-arm bedside commode & tub bench-[facility] [phone]                                                     My questions have been answered and I understand these instructions. I will adhere to these goals and the provided educational materials after my discharge from the hospital.  Patient/Caregiver Signature _______________________________ Date __________  Clinician Signature _______________________________________ Date __________  Please bring this form and your medication list with you to all your follow-up doctor's appointments.

## 2021-02-07 NOTE — Progress Notes (Signed)
Pt being d/c to CIR, VSS, telebox removed, all IV's out, patient belongings gathered.  Kalman Jewels, RN 02/07/2021 2:25 PM

## 2021-02-07 NOTE — H&P (Signed)
Physical Medicine and Rehabilitation Admission H&P       HPI: Richard [name] is a 60 year old right-handed male with history of CKD stage III, type 2 diabetes mellitus with neuropathy, LLL hamartoma, COPD 2 L oxygen dependent, chronic combined CHF, chronic bilateral ulcers who was admitted 01/15/2021 with severe sepsis due to group G bacteremia and acute on chronic respiratory failure due to CHF.  He was treated with IV diuresis as well as BiPAP.  He was started on broad-spectrum antibiotics with TEE for work-up per infectious disease.  TEE 7/28 showed ejection fraction of 15 to 20% with severely dilated LV and diffuse hypokinesis but was negative for vegetation, ASD, PFO and showed mild MR.  Left foot was suspected as source of infection and Dr. Lajoyce Corners was consulted for input.  Left foot CT showed swelling and soft tissue gas concerning for necrotizing infection a possibility of early osteomyelitis as well as nondisplaced calcaneal process fracture around lateral aspect.  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  Blood pressure stabilized, cardiac medications were resumed.  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  White count slowly trended down and ID recommended narrowing antibiotics to pen G with recommendations to continue antibiotics for 2 additional days.  CT chest done due to issues of shortness of breath and showed benign lesion as well as no acute findings.  PCCM consulted and reported that this was not a new finding as mass consistent with hematoma per prior biopsy and had been slowly growing.  They recommended having patient follow-up with weight for pulmonary after discharge.  He was admitted to inpatient rehab services 01/27/2021 for comprehensive therapy.  Vascular surgery consulted 01/31/2021 for abnormal ABIs and  was discharged to vascular surgery undergoing abdominal aortogram showing no significant aortoiliac or outflow disease/stenosis bilaterally.  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  He underwent successful crossing of the tibioperoneal trunk/posterior tibial artery occlusion and subsequent balloon angioplasty with no residual stenosis.  Patient with progressive ischemic changes right foot/abscess undergoing right first and second ray amputations application of wound VAC 02/04/2021 per Dr. Lajoyce Corners.  He remains on aspirin and Plavix therapy.  He has completed all antibiotic course.  Therapy evaluations resumed due to patient decreased functional ability was readmitted to resume inpatient rehab services.   Review of Systems  Constitutional:  Positive for fever.  HENT:  Negative for hearing loss.   Eyes:  Negative for blurred vision and double vision.  Respiratory:  Positive for shortness of breath.   Cardiovascular:  Positive for palpitations and leg swelling. Negative for chest pain.  Gastrointestinal:  Positive for constipation. Negative for heartburn, nausea and vomiting.       GERD  Genitourinary:  Positive for urgency. Negative for dysuria, flank pain and hematuria.  Musculoskeletal:  Positive for joint pain and myalgias.  Skin:  Negative for rash.  Neurological:  Positive for weakness.  Psychiatric/Behavioral:         Anxiety  All other systems reviewed and are negative.     Past Medical History:  Diagnosis Date   AICD (automatic cardioverter/defibrillator) present 01/11/2017   Anxiety     CHF (congestive heart failure) (HCC)     CKD (chronic kidney disease)     COPD (chronic obstructive pulmonary disease) (HCC)  never smoker, industrial exposure   Diabetes mellitus with diabetic neuropathy, with long-term current use of insulin (HCC)     GERD (gastroesophageal reflux disease)     High cholesterol     History of gout       "take RX qd" (11/01/2017)   Hypertension     Nonobstructive atherosclerosis of coronary artery     On home oxygen therapy      "2L prn" (11/01/2017)   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HCC)      LLL, present for years   Systolic HF (heart failure) (HCC)           Past Surgical History:  Procedure Laterality Date   ABDOMINAL AORTOGRAM W/LOWER EXTREMITY N/A 02/01/2021    Procedure: ABDOMINAL AORTOGRAM W/LOWER EXTREMITY;  Surgeon: Nada Libman, MD;  Location: [facility] INVASIVE CV LAB;  Service: Cardiovascular;  Laterality: N/A;   AMPUTATION Left 01/21/2021    Procedure: AMPUTATION BELOW KNEE;  Surgeon: Nadara Mustard, MD;  Location[facility] OR;  Service: Orthopedics;  Laterality: Left;   APPLICATION OF WOUND VAC Left 01/21/2021    Procedure: APPLICATION OF WOUND VAC;  Surgeon: Nadara Mustard, MD;  Location: [facility] OR;  Service: Orthopedics;  Laterality: Left;   CATARACT EXTRACTION W/ INTRAOCULAR LENS IMPLANTW/ TRABECULECTOMY Left ~ 2013    "may have done this when I had my other eye OR"   CIRCUMCISION       CORONARY STENT INTERVENTION N/A 11/02/2017    Procedure: CORONARY STENT INTERVENTION;  Surgeon: Laurey Morale, MD;  Location: [facility] INVASIVE CV LAB;  Service: Cardiovascular;  Laterality: N/A;   CORONARY STENT INTERVENTION N/A 11/02/2017    Procedure: CORONARY STENT INTERVENTION;  Surgeon: Lyn Records, MD;  Location: [facility] INVASIVE CV LAB;  Service: Cardiovascular;  Laterality: N/A;   EYE SURGERY Left ~ 2013    "fell on something in basement; stuck in my eye"   ICD IMPLANT        Medtronic Dual Chamber 01/11/17   MULTIPLE TOOTH EXTRACTIONS        "pulled all my top teeth"   PERIPHERAL VASCULAR BALLOON ANGIOPLASTY   02/01/2021    Procedure: PERIPHERAL VASCULAR BALLOON ANGIOPLASTY;  Surgeon: Nada Libman, MD;  Location: [facility] INVASIVE CV LAB;  Service: Cardiovascular;;  TP Trunk / PT   RIGHT/LEFT HEART CATH AND CORONARY ANGIOGRAPHY N/A 11/02/2017    Procedure: RIGHT/LEFT HEART CATH AND CORONARY ANGIOGRAPHY;   Surgeon: Laurey Morale, MD;  Location: [facility] INVASIVE CV LAB;  Service: Cardiovascular;  Laterality: N/A;   TEE WITHOUT CARDIOVERSION N/A 01/20/2021    Procedure: TRANSESOPHAGEAL ECHOCARDIOGRAM (TEE);  Surgeon: Laurey Morale, MD;  Location: [facility] ENDOSCOPY;  Service: Cardiovascular;  Laterality: N/A;         Family History  Problem Relation Age of Onset   Hypertension Mother     Diabetes Mother     Hypertension Father     Diabetes Father     Diabetes Sister     Diabetes Brother      Social History:  reports that he has quit smoking. His smoking use included cigarettes. He has never used smokeless tobacco. He reports that he does not currently use alcohol. He reports that he does not use drugs. Allergies:      Allergies  Allergen Reactions   Aspirin Itching   Codeine Hives   Coconut Oil Itching          Medications Prior to Admission  Medication Sig Dispense Refill  acetaminophen (TYLENOL) 650 MG CR tablet Take 650 mg by mouth every 8 (eight) hours as needed for pain.       albuterol (VENTOLIN HFA) 108 (90 Base) MCG/ACT inhaler Inhale 1-2 puffs into the lungs every 4 (four) hours as needed for wheezing or shortness of breath. 8 g 1   allopurinol (ZYLOPRIM) 100 MG tablet Take 1 tablet (100 mg total) by mouth daily. 30 tablet 3   aspirin 81 MG chewable tablet Chew 81 mg by mouth daily.       atorvastatin (LIPITOR) 40 MG tablet TAKE ONE TABLET BY MOUTH DAILY AT 6PM 90 tablet 0   bisoprolol (ZEBETA) 5 MG tablet Take 2.5 mg by mouth daily.       BREO ELLIPTA 100-25 MCG/INH AEPB Inhale 1 puff into the lungs daily.       busPIRone (BUSPAR) 5 MG tablet Take 5 mg by mouth 2 (two) times daily.       DULoxetine (CYMBALTA) 60 MG capsule Take 60 mg by mouth daily.   2   fenofibrate (TRICOR) 145 MG tablet TAKE ONE TABLET BY MOUTH DAILY 30 tablet 1   HYDROcodone-acetaminophen (NORCO/VICODIN) 5-325 MG tablet Take 1-2 tablets by mouth every 6 (six) hours as needed for moderate pain (pain score  4-6).       Insulin Pen Needle (NOVOFINE) 30G X 8 MM MISC Inject 10 each into the skin as needed. 100 each 0   insulin regular human CONCENTRATED (HUMULIN R) 500 UNIT/ML kwikpen Inject 0-400 Units into the skin as directed. Sliding       isosorbide mononitrate (IMDUR) 30 MG 24 hr tablet Take 1 tablet (30 mg total) by mouth daily.       JARDIANCE 25 MG TABS tablet Take 25 mg by mouth daily. 30 tablet 6   MAGNESIUM-OXIDE 400 (241.3 Mg) MG tablet Take 1 tablet (400 mg total) by mouth daily. 30 tablet 11   Multiple Vitamin (MULTIVITAMIN WITH MINERALS) TABS tablet Take 1 tablet by mouth daily.       nutrition supplement, JUVEN, (JUVEN) PACK Take 1 packet by mouth 2 (two) times daily between meals.   0   polyethylene glycol (MIRALAX / GLYCOLAX) 17 g packet Take 17 g by mouth daily as needed for mild constipation.   0   pregabalin (LYRICA) 150 MG capsule Take 150 mg by mouth 3 (three) times daily.       sacubitril-valsartan (ENTRESTO) 24-26 MG Take 1 tablet by mouth 2 (two) times daily. 60 tablet     spironolactone (ALDACTONE) 25 MG tablet Take 0.5 tablets (12.5 mg total) by mouth daily.       Torsemide 40 MG TABS Take 40 mg by mouth daily.       zinc sulfate 220 (50 Zn) MG capsule Take 1 capsule (220 mg total) by mouth daily.          Drug Regimen Review Drug regimen was reviewed and remains appropriate with no significant issues identified   Home: Home Living Family/patient expects to be discharged to:: Inpatient rehab Living Arrangements: Spouse/significant other, Children Available Help at Discharge: Family, Available PRN/intermittently Type of Home: House Home Access: Stairs to enter [employer] of Steps: 4 in front, 3 on side Entrance Stairs-Rails: Can reach both Home Layout: Two level, Able to live on main level with bedroom/bathroom, Full bath on main level Bathroom Shower/Tub: Tub/shower unit, Health visitor: Standard Bathroom Accessibility: Yes Home  Equipment: Cane - single point Additional Comments: Per chart:  Pt reports family will not assist him much.  Lives With: Spouse, Daughter, Son, Other (Comment)   Functional History: Prior Function Level of Independence: Independent with assistive device(s) Comments: Prior to initial admission, pt independent, driving, does not work. Has been at [facility] for post-acute rehab s/p L BKA   Functional Status:  Mobility: Bed Mobility Overal bed mobility: Needs Assistance Bed Mobility: Supine to Sit Supine to sit: Mod assist, HOB elevated Sit to supine: Supervision General bed mobility comments: ModA for HHA to elevate trunk Transfers Overall transfer level: Needs assistance Equipment used: None Transfers: Lateral/Scoot Transfers Sit to Stand: Min assist  Lateral/Scoot Transfers: Min assist General transfer comment: Deferred standing/pivot transfer secondary to WB precautions ("Ideally NWB, or TDWB through heel only"); pt requires assist for lateral scoot transfer to recliner, minA for safety, cues to minimize WB through R foot   ADL: ADL Eating/Feeding: Independent, Sitting Grooming: Wash/dry face, Wash/dry hands, Set up, Sitting Upper Body Bathing: Set up, Sitting Lower Body Bathing: Moderate assistance, Sitting/lateral leans, Minimal assistance Upper Body Dressing : Sitting, Set up Lower Body Dressing: Sitting/lateral leans, Moderate assistance   Cognition: Cognition Overall Cognitive Status: No family/caregiver present to determine baseline cognitive functioning Orientation Level: Oriented X4 Cognition Arousal/Alertness: Awake/alert Behavior During Therapy: Flat affect Overall Cognitive Status: No family/caregiver present to determine baseline cognitive functioning Area of Impairment: Attention, Following commands, Safety/judgement, Awareness, Problem solving, Memory Current Attention Level: Sustained, Selective Memory: Decreased short-term memory Following Commands: Follows one  step commands consistently Safety/Judgement: Decreased awareness of safety, Decreased awareness of deficits Awareness: Emergent Problem Solving: Slow processing, Difficulty sequencing, Requires verbal cues, Requires tactile cues   Physical Exam: Blood pressure 102/60, pulse 94, temperature 98.7 F (37.1 C), temperature source Oral, resp. rate 19, weight 106.6 kg, SpO2 93 %. Physical Exam Skin:    Comments: Right BKA site is dressed with stump shrinker.  Right lower extremity dressed with wound VAC in place appropriately tender.  Neurological:     Comments: Patient is alert.  Sitting up at bedside commode.  Provides name age and date of birth.    General: No acute distress Mood and affect are appropriate Heart: Regular rate and rhythm no rubs murmurs or extra sounds Lungs: Clear to auscultation, breathing unlabored, no rales or wheezes Abdomen: Positive bowel sounds, soft nontender to palpation, nondistended Extremities: No clubbing, cyanosis, or edema, B hand intrinsic atrophy Skin: No evidence of breakdown, no evidence of rash Neurologic: Cranial nerves II through XII intact, motor strength is 5/5 in bilateral deltoid, bicep, tricep, 4/5 grip, Bi hip flexor, RIght knee extensors, 3- Right ankle dorsiflexor and plantar flexor, LLE not tested due to BKA,  Sensory exam reduced sensation to light touch  in bilateral upper, LLE not test Right LE reduced sensation to LT below the knee, RIgh tfoot bandaged with Coban  Musculoskeletal: No pain with UE ROM, No knee eff on Right side , Limited ROM R ankle due to wound vac    Lab Results Last 48 Hours        Results for orders placed or performed during the hospital encounter of 02/01/21 (from the past 48 hour(s))  Glucose, capillary     Status: Abnormal    Collection Time: 02/05/21  6:21 AM  Result Value Ref Range    Glucose-Capillary 179 (H) 70 - 99 mg/dL      Comment: Glucose reference range applies only to samples taken after fasting for  at least 8 hours.  Glucose, capillary  Status: Abnormal    Collection Time: 02/05/21 12:06 PM  Result Value Ref Range    Glucose-Capillary 198 (H) 70 - 99 mg/dL      Comment: Glucose reference range applies only to samples taken after fasting for at least 8 hours.  Glucose, capillary     Status: Abnormal    Collection Time: 02/05/21  5:45 PM  Result Value Ref Range    Glucose-Capillary 138 (H) 70 - 99 mg/dL      Comment: Glucose reference range applies only to samples taken after fasting for at least 8 hours.  Glucose, capillary     Status: Abnormal    Collection Time: 02/05/21  9:46 PM  Result Value Ref Range    Glucose-Capillary 183 (H) 70 - 99 mg/dL      Comment: Glucose reference range applies only to samples taken after fasting for at least 8 hours.    Comment 1 Notify RN      Comment 2 Document in Chart    Basic metabolic panel     Status: Abnormal    Collection Time: 02/06/21  5:00 AM  Result Value Ref Range    Sodium 133 (L) 135 - 145 mmol/L    Potassium 4.1 3.5 - 5.1 mmol/L    Chloride 100 98 - 111 mmol/L    CO2 26 22 - 32 mmol/L    Glucose, Bld 105 (H) 70 - 99 mg/dL      Comment: Glucose reference range applies only to samples taken after fasting for at least 8 hours.    BUN 24 (H) 6 - 20 mg/dL    Creatinine, Ser 9.14 0.61 - 1.24 mg/dL    Calcium 8.3 (L) 8.9 - 10.3 mg/dL    GFR, Estimated >78 >29 mL/min      Comment: (NOTE) Calculated using the CKD-EPI Creatinine Equation (2021)      Anion gap 7 5 - 15      Comment: Performed at [facility], [address] [place], [place] [address]  CBC     Status: Abnormal    Collection Time: 02/06/21  5:00 AM  Result Value Ref Range    WBC 12.3 (H) 4.0 - 10.5 K/uL    RBC 4.07 (L) 4.22 - 5.81 MIL/uL    Hemoglobin 11.8 (L) 13.0 - 17.0 g/dL    HCT 08.6 (L) 57.8 - 52.0 %    MCV 88.5 80.0 - 100.0 fL    MCH 29.0 26.0 - 34.0 pg    MCHC 32.8 30.0 - 36.0 g/dL    RDW 46.9 (H) 62.9 - 15.5 %    Platelets 200 150 - 400 K/uL     nRBC 0.0 0.0 - 0.2 %      Comment: Performed at [facility], [address], [place], [place] [address]  Glucose, capillary     Status: Abnormal    Collection Time: 02/06/21  6:20 AM  Result Value Ref Range    Glucose-Capillary 104 (H) 70 - 99 mg/dL      Comment: Glucose reference range applies only to samples taken after fasting for at least 8 hours.    Comment 1 Notify RN      Comment 2 Document in Chart    Glucose, capillary     Status: Abnormal    Collection Time: 02/06/21  8:11 AM  Result Value Ref Range    Glucose-Capillary 119 (H) 70 - 99 mg/dL      Comment: Glucose reference range  applies only to samples taken after fasting for at least 8 hours.  Glucose, capillary     Status: Abnormal    Collection Time: 02/06/21 12:14 PM  Result Value Ref Range    Glucose-Capillary 108 (H) 70 - 99 mg/dL      Comment: Glucose reference range applies only to samples taken after fasting for at least 8 hours.  Glucose, capillary     Status: Abnormal    Collection Time: 02/06/21  5:35 PM  Result Value Ref Range    Glucose-Capillary 112 (H) 70 - 99 mg/dL      Comment: Glucose reference range applies only to samples taken after fasting for at least 8 hours.  Glucose, capillary     Status: Abnormal    Collection Time: 02/06/21  8:59 PM  Result Value Ref Range    Glucose-Capillary 116 (H) 70 - 99 mg/dL      Comment: Glucose reference range applies only to samples taken after fasting for at least 8 hours.  Glucose, capillary     Status: Abnormal    Collection Time: 02/07/21  6:05 AM  Result Value Ref Range    Glucose-Capillary 122 (H) 70 - 99 mg/dL      Comment: Glucose reference range applies only to samples taken after fasting for at least 8 hours.      Imaging Results (Last 48 hours)  No results found.           Medical Problem List and Plan: 1.  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  Wound VAC as  directed             -patient may not shower             -ELOS/Goals: 14-16d 2.  Antithrombotics: -DVT/anticoagulation: SCD right lower extremity Pharmaceutical: Lovenox             -antiplatelet therapy: Aspirin 81 mg daily and Plavix 75 mg daily 3. Pain Management: Neurontin 100 mg 3 times daily, oxycodone as needed 4. Mood: Melatonin 3 mg nightly, BuSpar 5 mg twice daily, Cymbalta 60 mg daily             -antipsychotic agents: N/A 5. Neuropsych: This patient is capable of making decisions on his own behalf. 6. Skin/Wound Care: Routine skin checks 7. Fluids/Electrolytes/Nutrition: Routine in and outs with follow-up chemistries 8.  Acute on chronic anemia.  Follow-up CBC 9.  Chronic combined CHF.  Monitor for any signs of fluid overload.  Aldactone 12.5 mg daily, Demadex 40 mg daily, Entresto 24-26 mg twice daily 10.  COPD.  Check oxygen saturations every shift 11.  Diabetes mellitus or peripheral neuropathy.  Jardiance 10 mg daily, NovoLog 4 units 3 times daily, Semglee 13 units twice daily 12.  History of gout.  Allopurinol 100 mg daily 13.  Hyperlipidemia.  Lipitor 14.  Hypertension.  Zebeta 2.5 mg daily, Imdur 30 mg daily 15.  GERD.  Protonix       Mcarthur RossettiDaniel J Angiulli, PA-C 02/07/2021         "I have personally performed a face to face diagnostic evaluation of this patient.  Additionally, I have reviewed and concur with the physician assistant's documentation above."  Erick ColaceAndrew E. Cire Deyarmin M.D. [facility] Fellow Am Acad of Phys Med and Rehab Diplomate Am Board of Electrodiagnostic Med Fellow Am Board of Interventional Pain

## 2021-02-08 LAB — COMPREHENSIVE METABOLIC PANEL
ALT: 25 U/L (ref 0–44)
AST: 26 U/L (ref 15–41)
Albumin: 1.8 g/dL — ABNORMAL LOW (ref 3.5–5.0)
Alkaline Phosphatase: 77 U/L (ref 38–126)
Anion gap: 8 (ref 5–15)
BUN: 24 mg/dL — ABNORMAL HIGH (ref 6–20)
CO2: 30 mmol/L (ref 22–32)
Calcium: 8.7 mg/dL — ABNORMAL LOW (ref 8.9–10.3)
Chloride: 97 mmol/L — ABNORMAL LOW (ref 98–111)
Creatinine, Ser: 1.04 mg/dL (ref 0.61–1.24)
GFR, Estimated: >60 mL/min (ref >60)
Glucose, Bld: 90 mg/dL (ref 70–99)
Potassium: 3.6 mmol/L (ref 3.5–5.1)
Sodium: 135 mmol/L (ref 135–145)
Total Bilirubin: 0.9 mg/dL (ref 0.3–1.2)
Total Protein: 7.3 g/dL (ref 6.5–8.1)

## 2021-02-08 LAB — GLUCOSE, CAPILLARY
Glucose-Capillary: 89 mg/dL (ref 70–99)
Glucose-Capillary: 121 mg/dL — ABNORMAL HIGH (ref 70–99)
Glucose-Capillary: 123 mg/dL — ABNORMAL HIGH (ref 70–99)
Glucose-Capillary: 121 mg/dL — ABNORMAL HIGH (ref 70–99)

## 2021-02-08 LAB — CBC WITH DIFFERENTIAL/PLATELET
Abs Immature Granulocytes: 0.03 10*3/uL (ref 0.00–0.07)
Basophils Absolute: 0 10*3/uL (ref 0.0–0.1)
Basophils Relative: 0 %
Eosinophils Absolute: 0.1 10*3/uL (ref 0.0–0.5)
Eosinophils Relative: 1 %
HCT: 34.1 % — ABNORMAL LOW (ref 39.0–52.0)
Hemoglobin: 10.8 g/dL — ABNORMAL LOW (ref 13.0–17.0)
Immature Granulocytes: 0 %
Lymphocytes Relative: 16 %
Lymphs Abs: 1.4 10*3/uL (ref 0.7–4.0)
MCH: 28.3 pg (ref 26.0–34.0)
MCHC: 31.7 g/dL (ref 30.0–36.0)
MCV: 89.3 fL (ref 80.0–100.0)
Monocytes Absolute: 1 10*3/uL (ref 0.1–1.0)
Monocytes Relative: 12 %
Neutro Abs: 6.1 10*3/uL (ref 1.7–7.7)
Neutrophils Relative %: 71 %
Platelets: 242 10*3/uL (ref 150–400)
RBC: 3.82 MIL/uL — ABNORMAL LOW (ref 4.22–5.81)
RDW: 18.2 % — ABNORMAL HIGH (ref 11.5–15.5)
WBC: 8.7 10*3/uL (ref 4.0–10.5)
nRBC: 0 % (ref 0.0–0.2)

## 2021-02-08 MED ORDER — INSULIN ASPART 100 UNIT/ML IJ SOLN
0.0000 [IU] | Freq: Three times a day (TID) | INTRAMUSCULAR | Status: DC
  Administered 2021-02-08: 2 [IU] via SUBCUTANEOUS
  Administered 2021-02-09 – 2021-02-10 (×3): 3 [IU] via SUBCUTANEOUS
  Administered 2021-02-10 – 2021-02-13 (×6): 2 [IU] via SUBCUTANEOUS
  Administered 2021-02-14 (×2): 3 [IU] via SUBCUTANEOUS
  Administered 2021-02-14 – 2021-02-15 (×3): 2 [IU] via SUBCUTANEOUS
  Administered 2021-02-16: 3 [IU] via SUBCUTANEOUS

## 2021-02-08 MED ORDER — ONDANSETRON HCL 4 MG PO TABS
4.0000 mg | ORAL_TABLET | Freq: Three times a day (TID) | ORAL | Status: DC | PRN
  Administered 2021-02-08 – 2021-02-15 (×4): 4 mg via ORAL
  Filled 2021-02-08 (×5): qty 1

## 2021-02-08 MED ORDER — INSULIN ASPART 100 UNIT/ML IJ SOLN: 0.0000 [IU] | Freq: Every day | INTRAMUSCULAR | Status: DC

## 2021-02-08 MED ORDER — SORBITOL 70 % SOLN
30.0000 mL | Freq: Every day | Status: DC | PRN
  Administered 2021-02-08: 30 mL via ORAL
  Filled 2021-02-08: qty 30

## 2021-02-08 NOTE — Patient Care Conference (Signed)
Inpatient RehabilitationTeam Conference and Plan of Care Update Date: 02/08/2021   Time: 11:50 AM    Patient Name: Richard [name]      Medical Record Number: [id number]  Date of Birth: 1960/08/22 Sex: Male         Room/Bed: 4W18C/4W18C-01 Payor Info: Payor: MEDICARE / Plan: MEDICARE PART A AND B / Product Type: *No Product type* /    Admit Date/Time:  02/07/2021  2:32 PM  Primary Diagnosis:  S/P BKA (below knee amputation), left [facility])  Hospital Problems: Principal Problem:   S/P BKA (below knee amputation), left [facility]) Active Problems:   Left below-knee amputee [facility])    Expected Discharge Date: Expected Discharge Date: 02/16/21  Team Members Present: Physician leading conference: Dr. Faith Rogue Social Worker Present: Dossie Der, LCSW Nurse Present: Kennyth Arnold, RN PT Present: Bernie Covey, PT OT Present: Earleen Newport, OT PPS Coordinator present : Fae Pippin, SLP     Current Status/Progress Goal Weekly Team Focus  Bowel/Bladder   continent B/B  remain continent      Swallow/Nutrition/ Hydration             ADL's   lateral scoot transfers Min/CGA, LB dress total A but would do better bed level/EOB, toileting Mod/Max, wound vac on R foot  Supervision B&D, Mod I toilet  lateral scoot/squat pivot transfers, ADL retraining, AE PRN for ADLs, maintaining WB during tasks, DM/limb loss education   Mobility   min stand pivot, CGA STS with RW, w/c mobility supervision, limited by WB precautions, car transfer CGA  supervision gait/standing transfers, mod I scoot transfers  functional mobility, stairs   Communication             Safety/Cognition/ Behavioral Observations            Pain   no c/o pain  < 3  assess pain q 4hr and prn   Skin   wound vac to right foot, left BKA with shrinker  no new skin breakdown  assess skin q shift and prn     Discharge Planning:  Home with [name]-girlfriend and other family members checking on him, at times may be alone   Team  Discussion: Wound vac to right foot, DM controlled. Continent B/B, no pain, PRN medication available. Min assist lateral scoot transfers, TDWB through heel only. Contact guard/min assist bathing. Mod assist toileting. Min assist stand pivot, car transfers, with cueing. Supervision WC. Supervision goals standing and gait. Mod I at [employer] level. Stairs are a barrier. Patient on target to meet rehab goals: yes  *See Care Plan and progress notes for long and short-term goals.   Revisions to Treatment Plan:  Continue to monitor right foot, left BKA.  Teaching Needs: Family education, medication management, pain management, skin/wound care, residual limb education, transfer training, gait training, WC training, balance training, endurance training, stair training, safety awareness.  Current Barriers to Discharge: Decreased caregiver support, Medical stability, Home enviroment access/layout, Wound care, Lack of/limited family support, Weight, Weight bearing restrictions, and Medication compliance  Possible Resolutions to Barriers: Continue current medications, provide emotional support.     Medical Summary Current Status: prior left bka, necrotic right foot-->first and second ray amps RLE. has vac RLE. bp and dm better controls.  Barriers to Discharge: Medical stability   Possible Resolutions to Barriers/Weekly Focus: daily wound care, pain mgt, assessment of labs and pt data   Continued Need for Acute Rehabilitation Level of Care: The patient requires daily medical management by a physician with specialized  training in physical medicine and rehabilitation for the following reasons: Direction of a multidisciplinary physical rehabilitation program to maximize functional independence : Yes Medical management of patient stability for increased activity during participation in an intensive rehabilitation regime.: Yes Analysis of laboratory values and/or radiology reports with any subsequent need for  medication adjustment and/or medical intervention. : Yes   I attest that I was present, lead the team conference, and concur with the assessment and plan of the team.   Tennis Must 02/08/2021, 4:59 PM

## 2021-02-08 NOTE — Progress Notes (Signed)
PROGRESS NOTE   Subjective/Complaints: C/o mild constipation , no abd pain , asking about ELOS  Labs reviewed  ROS- neg CP, Neg SOB, - N/V/D Objective:   No results found. Recent Labs    02/06/21 0500 02/08/21 0523  WBC 12.3* 8.7  HGB 11.8* 10.8*  HCT 36.0* 34.1*  PLT 200 242   Recent Labs    02/06/21 0500 02/08/21 0523  NA 133* 135  K 4.1 3.6  CL 100 97*  CO2 26 30  GLUCOSE 105* 90  BUN 24* 24*  CREATININE 0.98 1.04  CALCIUM 8.3* 8.7*    Intake/Output Summary (Last 24 hours) at 02/08/2021 0918 Last data filed at 02/08/2021 0501 Gross per 24 hour  Intake 240 ml  Output 900 ml  Net -660 ml        Physical Exam: Vital Signs Blood pressure 117/84, pulse 86, temperature 98.1 F (36.7 C), temperature source Oral, resp. rate 18, height 6\' 5"  (1.956 m), SpO2 90 %.   General: No acute distress Mood and affect are appropriate Heart: Regular rate and rhythm no rubs murmurs or extra sounds Lungs: Clear to auscultation, breathing unlabored, no rales or wheezes Abdomen: Positive bowel sounds, soft nontender to palpation, nondistended Extremities: No clubbing, cyanosis, or edema Skin: No evidence of breakdown, no evidence of rash Neurologic: Cranial nerves II through XII intact, motor strength is 5/5 in bilateral deltoid, bicep, tricep, 4/6 grip,4-  hip flexor,RIgh t knee extensors,  Sensory exam reduce sensation to light touch  bilateral hands and below knees  Cerebellar exam normal finger to nose to finger as well as heel to shin in bilateral upper and lower extremities Musculoskeletal:left BKA with shrinker and limb gard  Right 1st and 2nd ray amp, wound vac    Assessment/Plan: 1. Functional deficits which require 3+ hours per day of interdisciplinary therapy in a comprehensive inpatient rehab setting. Physiatrist is providing close team supervision and 24 hour management of active medical problems listed  below. Physiatrist and rehab team continue to assess barriers to discharge/monitor patient progress toward functional and medical goals  Care Tool:  Bathing              Bathing assist       Upper Body Dressing/Undressing Upper body dressing   What is the patient wearing?: Hospital gown only    Upper body assist Assist Level: Set up assist    Lower Body Dressing/Undressing Lower body dressing            Lower body assist       Toileting Toileting    Toileting assist Assist for toileting: Independent with assistive device Assistive Device Comment: urinal   Transfers Chair/bed transfer  Transfers assist           Locomotion Ambulation   Ambulation assist              Walk 10 feet activity   Assist           Walk 50 feet activity   Assist           Walk 150 feet activity   Assist           Walk  10 feet on uneven surface  activity   Assist           Wheelchair     Assist               Wheelchair 50 feet with 2 turns activity    Assist            Wheelchair 150 feet activity     Assist          Blood pressure 117/84, pulse 86, temperature 98.1 F (36.7 C), temperature source Oral, resp. rate 18, height 6\' 5"  (1.956 m), SpO2 90 %.  Medical Problem List and Plan: 1.  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  Wound VAC as directed             -patient may not shower             -ELOS/Goals: 14-16d 2.  Antithrombotics: -DVT/anticoagulation: SCD right lower extremity Pharmaceutical: Lovenox             -antiplatelet therapy: Aspirin 81 mg daily and Plavix 75 mg daily 3. Pain Management: Neurontin 100 mg 3 times daily, oxycodone as needed 4. Mood: Melatonin 3 mg nightly, BuSpar 5 mg twice daily, Cymbalta 60 mg daily             -antipsychotic agents: N/A 5. Neuropsych: This patient is capable of making decisions  on his own behalf. 6. Skin/Wound Care: Routine skin checks 7. Fluids/Electrolytes/Nutrition: Routine in and outs with follow-up chemistries 8.  Acute on chronic anemia.  Follow-up CBC 9.  Chronic combined CHF.  Monitor for any signs of fluid overload.  Aldactone 12.5 mg daily, Demadex 40 mg daily, Entresto 24-26 mg twice daily 10.  COPD.  Check oxygen saturations every shift 11.  Diabetes mellitus or peripheral neuropathy.  Jardiance 10 mg daily, NovoLog 4 units 3 times daily, Semglee 13 units twice daily CBG (last 3)  Recent Labs    02/07/21 1630 02/07/21 2049 02/08/21 0604  GLUCAP 140* 128* 89  Controlled 8 /16 12.  History of gout.  Allopurinol 100 mg daily 13.  Hyperlipidemia.  Lipitor 14.  Hypertension.  Zebeta 2.5 mg daily, Imdur 30 mg daily Vitals:   02/07/21 2039 02/08/21 0457  BP: 108/78 117/84  Pulse: 92 86  Resp:  18  Temp:  98.1 F (36.7 C)  SpO2:  90%    15.  GERD.  Protonix    LOS: 1 days A FACE TO FACE EVALUATION WAS PERFORMED  02/10/21 02/08/2021, 9:18 AM

## 2021-02-08 NOTE — Progress Notes (Signed)
Inpatient Rehabilitation  Patient information reviewed and entered into eRehab system by Crescencio Jozwiak Chelcy Bolda, OTR/L.   Information including medical coding, functional ability and quality indicators will be reviewed and updated through discharge.    

## 2021-02-08 NOTE — Evaluation (Signed)
Physical Therapy Assessment and Plan  Patient Details  Name: Richard [name] MRN: [id number] Date of Birth: Jun 26, 1961  PT Diagnosis: Abnormality of gait, Coordination disorder, Difficulty walking, Dizziness and giddiness, Edema, Impaired sensation, and Pain in joint Rehab Potential: Good ELOS: 7-10 days   Today's Date: 02/08/2021 PT Individual Time: 1005-1100 PT Individual Time Calculation (min): 88 min    Hospital Problem: Principal Problem:   S/P BKA (below knee amputation), left ([place]) Active Problems:   Left below-knee amputee [facility])   Past Medical History:  Past Medical History:  Diagnosis Date   AICD (automatic cardioverter/defibrillator) present 01/11/2017   Anxiety    CHF (congestive heart failure) (HCC)    CKD (chronic kidney disease)    COPD (chronic obstructive pulmonary disease) ([place])    never smoker, industrial exposure   Diabetes mellitus with diabetic neuropathy, with long-term current use of insulin (HCC)    GERD (gastroesophageal reflux disease)    High cholesterol    History of gout    "take RX qd" (11/01/2017)   Hypertension    Nonobstructive atherosclerosis of coronary artery    On home oxygen therapy    "2L prn" (11/01/2017)   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HCC)    LLL, present for years   Systolic HF (heart failure) ([place])    Past Surgical History:  Past Surgical History:  Procedure Laterality Date   ABDOMINAL AORTOGRAM W/LOWER EXTREMITY N/A 02/01/2021   Procedure: ABDOMINAL AORTOGRAM W/LOWER EXTREMITY;  Surgeon: Serafina Mitchell, MD;  Location: [place] [facility];  Service: Cardiovascular;  Laterality: N/A;   AMPUTATION Left 01/21/2021   Procedure: AMPUTATION BELOW KNEE;  Surgeon: Newt Minion, MD;  Location: [place];  Service: Orthopedics;  Laterality: Left;   AMPUTATION Right 02/04/2021   Procedure: RIGHT FOOT 1ST AND 2ND RAY AMPUTATION;  Surgeon: Newt Minion, MD;  Location: [place];  Service: Orthopedics;  Laterality: Right;   APPLICATION OF WOUND VAC Left  01/21/2021   Procedure: APPLICATION OF WOUND VAC;  Surgeon: Newt Minion, MD;  Location: [place];  Service: Orthopedics;  Laterality: Left;   APPLICATION OF WOUND VAC  02/04/2021   Procedure: APPLICATION OF WOUND VAC;  Surgeon: Newt Minion, MD;  Location: [place];  Service: Orthopedics;;   CATARACT EXTRACTION W/ INTRAOCULAR LENS IMPLANTW/ TRABECULECTOMY Left ~ 2013   "may have done this when I had my other eye OR"   [place] N/A 11/02/2017   Procedure: CORONARY STENT INTERVENTION;  Surgeon: Larey Dresser, MD;  Location: [place] [facility];  Service: Cardiovascular;  Laterality: N/A;   CORONARY STENT INTERVENTION N/A 11/02/2017   Procedure: CORONARY STENT INTERVENTION;  Surgeon: Belva Crome, MD;  Location: [place] [facility];  Service: Cardiovascular;  Laterality: N/A;   EYE SURGERY Left ~ 2013   "fell on something in basement; stuck in my eye"   ICD IMPLANT     Medtronic Dual Chamber 01/11/17   MULTIPLE TOOTH EXTRACTIONS     "pulled all my top teeth"   PERIPHERAL VASCULAR BALLOON ANGIOPLASTY  02/01/2021   Procedure: PERIPHERAL VASCULAR BALLOON ANGIOPLASTY;  Surgeon: Serafina Mitchell, MD;  Location: [place] [facility];  Service: Cardiovascular;;  TP Trunk / PT   RIGHT/LEFT HEART CATH AND CORONARY ANGIOGRAPHY N/A 11/02/2017   Procedure: RIGHT/LEFT HEART CATH AND CORONARY ANGIOGRAPHY;  Surgeon: Larey Dresser, MD;  Location: [place] [facility];  Service: Cardiovascular;  Laterality: N/A;   TEE WITHOUT CARDIOVERSION N/A  01/20/2021   Procedure: TRANSESOPHAGEAL ECHOCARDIOGRAM (TEE);  Surgeon: Larey Dresser, MD;  Location: [facility] ENDOSCOPY;  Service: Cardiovascular;  Laterality: N/A;    Assessment & Plan Clinical Impression: Richard [name] is a 60 year old right-handed male with history of CKD stage III, type 2 diabetes mellitus with neuropathy, LLL hamartoma, COPD 2 L oxygen dependent, chronic combined CHF, chronic bilateral ulcers who was admitted 01/15/2021 with  severe sepsis due to group G bacteremia and acute on chronic respiratory failure due to CHF.  He was treated with IV diuresis as well as BiPAP.  He was started on broad-spectrum antibiotics with TEE for work-up per infectious disease.  TEE 7/28 showed ejection fraction of 15 to 20% with severely dilated LV and diffuse hypokinesis but was negative for vegetation, ASD, PFO and showed mild MR.  Left foot was suspected as source of infection and Dr. Sharol Given was consulted for input.  Left foot CT showed swelling and soft tissue gas concerning for necrotizing infection a possibility of early osteomyelitis as well as nondisplaced calcaneal process fracture around lateral aspect.  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  Blood pressure stabilized, cardiac medications were resumed.  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  White count slowly trended down and ID recommended narrowing antibiotics to pen G with recommendations to continue antibiotics for 2 additional days.  CT chest done due to issues of shortness of breath and showed benign lesion as well as no acute findings.  PCCM consulted and reported that this was not a new finding as mass consistent with hematoma per prior biopsy and had been slowly growing.  They recommended having patient follow-up with weight for pulmonary after discharge.  He was admitted to inpatient rehab services 01/27/2021 for comprehensive therapy.  Vascular surgery consulted 01/31/2021 for abnormal ABIs and was discharged to vascular surgery undergoing abdominal aortogram showing no significant aortoiliac or outflow disease/stenosis bilaterally.  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  He underwent successful crossing of the tibioperoneal  trunk/posterior tibial artery occlusion and subsequent balloon angioplasty with no residual stenosis.  Patient with progressive ischemic changes right foot/abscess undergoing right first and second ray amputations application of wound VAC 02/04/2021 per Dr. Sharol Given.  He remains on aspirin and Plavix therapy.  He has completed all antibiotic course.  Therapy evaluations resumed due to patient decreased functional ability was readmitted to resume inpatient rehab services.Patient transferred to CIR on 02/07/2021 .   Patient currently requires min with mobility secondary to muscle weakness, decreased cardiorespiratoy endurance, decreased coordination, and decreased sitting balance, decreased standing balance, decreased balance strategies, and difficulty maintaining precautions.  Prior to hospitalization, patient was independent  with mobility and lived with Spouse, Daughter, Son, Other (Comment) (unclear, pt said to live alone but also lives with multiple family members) in a House home.  Home access is 4 in front, 3 on sideStairs to enter.  Patient will benefit from skilled PT intervention to maximize safe functional mobility, minimize fall risk, and decrease caregiver burden for planned discharge home with intermittent assist.  Anticipate patient will benefit from follow up [facility] at discharge.  PT - End of Session Activity Tolerance: Tolerates 10 - 20 min activity with multiple rests Endurance Deficit: Yes Endurance Deficit Description: Pt requires frequent rest breaks PT Assessment  Rehab Potential (ACUTE/IP ONLY): Good PT Barriers to Discharge: Inaccessible home environment;Decreased caregiver support;Home environment access/layout;Wound Care;Lack of/limited family support;Weight;Weight bearing restrictions PT Patient demonstrates impairments in the following area(s): Balance;Edema;Motor;Pain;Sensory;Skin Integrity;Safety PT Transfers Functional Problem(s): Bed to Chair;Car;Furniture PT Locomotion Functional  Problem(s): Ambulation;Wheelchair Mobility;Stairs PT Plan PT Intensity: Minimum of 1-2 x/day ,45 to 90 minutes PT Frequency: 5 out of 7 days PT Duration Estimated Length of Stay: 7-10 days PT Treatment/Interventions: Ambulation/gait training;Cognitive remediation/compensation;Community reintegration;Discharge planning;DME/adaptive equipment instruction;Functional mobility training;Neuromuscular re-education;Pain management;Patient/family education;Psychosocial support;Skin care/wound management;Stair training;Therapeutic Activities;Therapeutic Exercise;UE/LE Strength taining/ROM;UE/LE Coordination activities;Wheelchair propulsion/positioning PT Transfers Anticipated Outcome(s): supervision standing, mod I scoot PT Locomotion Anticipated Outcome(s): mod I w/c PT Recommendation Recommendations for Other Services: Neuropsych consult;Therapeutic Recreation consult Therapeutic Recreation Interventions: Stress management Follow Up Recommendations: Home health PT Patient destination: Home Equipment Recommended: Wheelchair (measurements);Rolling walker with 5" wheels   PT Evaluation Precautions/Restrictions Precautions Precautions: Fall;Other (comment) Precaution Comments: Recent 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); R foot portable wound vac Required Braces or Orthoses: Other Brace Other Brace: L BKA limb guard (notably soiled, pink foam still with crusty blood); R foot post-op shoe Restrictions Weight Bearing Restrictions: Yes RLE Weight Bearing: Non weight bearing LLE Weight Bearing: Non weight bearing Other Position/Activity Restrictions: Per MD note for R foot: "Weightbearing: Ideally nonweightbearing on the right but touchdown weightbearing on the heel would be acceptable". General   Vital Signs  Pain Pain Assessment Pain Scale: 0-10 Pain Score: 0-No pain Home Living/Prior Functioning Home Living Available Help at Discharge: Family;Available PRN/intermittently Type of Home: House Home Access: Stairs  to enter [employer] of Steps: 4 in front, 3 on side Entrance Stairs-Rails: Can reach both Home Layout: Two level;Able to live on main level with bedroom/bathroom;Full bath on main level Bathroom Shower/Tub: Tub/shower unit;Walk-in shower Bathroom Toilet: Standard Bathroom Accessibility: Yes Additional Comments: Per chart:  Pt reports family will not assist him much and only available PRN.  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 Has been at [facility] for post-acute rehab s/p L BKA Vision/Perception  Perception Perception: Within Functional Limits Praxis Praxis: Intact  Cognition Overall Cognitive Status: Within Functional Limits for tasks assessed Arousal/Alertness: Awake/alert Orientation Level: Oriented to person;Oriented to place;Oriented to situation Attention: Focused;Sustained Focused Attention: Appears intact Sustained Attention: Appears intact Memory: Impaired Memory Impairment: Decreased recall of new information Immediate Memory Recall: Sock;Blue;Bed Memory Recall Sock: Without Cue Memory Recall Blue: Without Cue Memory Recall Bed: Not able to recall Awareness: Impaired Awareness Impairment: Intellectual impairment Problem Solving: Appears intact Safety/Judgment: Appears intact Sensation Sensation Light Touch: Impaired by gross assessment Light Touch Impaired Details: Impaired RLE;Impaired RUE;Impaired LUE Hot/Cold: Impaired by gross assessment Proprioception: Impaired by gross assessment Coordination Gross Motor Movements are Fluid  and Coordinated: No Fine Motor Movements are Fluid and Coordinated: Yes Coordination and Movement Description: Affected by altered center of mass due to amputations and sensation deficits Finger Nose Finger Test: [facility] but slightly dysmetric Motor  Motor Motor: Other (comment) Motor - Skilled Clinical Observations: grossly limited d/t amputations and altered center of mass   Trunk/Postural Assessment  Cervical Assessment Cervical Assessment: Within Functional Limits (Simultaneous filing. User may not have seen previous data.) Thoracic Assessment Thoracic Assessment: Within Functional Limits (Simultaneous  filing. User may not have seen previous data.) Lumbar Assessment Lumbar Assessment: Within Functional Limits (Simultaneous filing. User may not have seen previous data.) Postural Control Postural Control: Deficits on evaluation (Simultaneous filing. User may not have seen previous data.)  Balance Balance Balance Assessed: Yes (Simultaneous filing. User may not have seen previous data.) Static Sitting Balance Static Sitting - Balance Support: Feet supported (RLE only with surgical shoe) Static Sitting - Level of Assistance: 6: Modified independent (Device/Increase time) Dynamic Sitting Balance Dynamic Sitting - Balance Support: No upper extremity supported (Simultaneous filing. User may not have seen previous data.) Dynamic Sitting - Level of Assistance: 5: Stand by assistance (Simultaneous filing. User may not have seen previous data.) Dynamic Sitting - Balance Activities: Lateral lean/weight shifting;Forward lean/weight shifting;Reaching for objects Sitting balance - Comments: Required assist to don R foot post op shoe Dynamic Standing Balance Dynamic Standing - Balance Support: Bilateral upper extremity supported Dynamic Standing - Level of Assistance: 4: Min assist Extremity Assessment  RUE Assessment RUE Assessment: Within Functional Limits LUE Assessment LUE Assessment: Within  Functional Limits RLE Assessment RLE Assessment: Exceptions to [facility] General Strength Comments: 4-/5 overall LLE Assessment LLE Assessment: Not tested General Strength Comments: Not tested d/t pain and WB precautions  Care Tool Care Tool Bed Mobility Roll left and right activity   Roll left and right assist level: Contact Guard/Touching assist    Sit to lying activity   Sit to lying assist level: Supervision/Verbal cueing    Lying to sitting edge of bed activity   Lying to sitting edge of bed assist level: Supervision/Verbal cueing     Care Tool Transfers Sit to stand transfer Sit to stand activity did not occur: Safety/medical concerns Sit to stand assist level: Minimal Assistance - Patient > 75%    Chair/bed transfer   Chair/bed transfer assist level: Minimal Assistance - Patient > 75%     Physiological scientist transfer assist level: Contact Guard/Touching assist      Care Tool Locomotion Ambulation Ambulation activity did not occur: Safety/medical concerns        Walk 10 feet activity Walk 10 feet activity did not occur: Safety/medical concerns       Walk 50 feet with 2 turns activity Walk 50 feet with 2 turns activity did not occur: Safety/medical concerns      Walk 150 feet activity Walk 150 feet activity did not occur: Safety/medical concerns      Walk 10 feet on uneven surfaces activity Walk 10 feet on uneven surfaces activity did not occur: Safety/medical concerns      Stairs Stair activity did not occur: Safety/medical concerns        Walk up/down 1 step activity Walk up/down 1 step or curb (drop down) activity did not occur: Safety/medical concerns     Walk up/down 4 steps activity did not occuR: Safety/medical concerns  Walk up/down 4 steps activity      Walk up/down 12 steps activity Walk up/down 12 steps activity did not occur: Safety/medical concerns      Pick up small objects from floor Pick up small object from the  floor (from standing position) activity did not occur: Safety/medical concerns      Wheelchair Will patient use wheelchair at discharge?: Yes Type of Wheelchair: Manual   Wheelchair assist level: Supervision/Verbal cueing Max wheelchair distance: 50 ft  Wheel 50 feet with 2 turns activity   Assist Level: Supervision/Verbal cueing  Wheel 150 feet activity   Assist Level: Minimal Assistance - Patient > 75%    Refer to Care Plan for Long Term Goals  SHORT TERM GOAL WEEK 1 PT Short Term Goal 1 (Week 1): =LTGs d/t ELOS  Recommendations for other services: Neuropsych and Therapeutic Recreation  Stress management and Outing/community reintegration  Skilled Therapeutic Intervention Mobility Bed Mobility Bed Mobility: Sit to Supine Rolling Right: Supervision/verbal cueing Right Sidelying to Sit: Supervision/Verbal cueing Supine to Sit: Supervision/Verbal cueing Sitting - Scoot to Edge of Bed: Supervision/Verbal cueing Sit to Supine: Supervision/Verbal cueing Transfers Transfers: Sit to Stand;Stand to Sit Sit to Stand: Minimal Assistance - Patient > 75% Stand to Sit: Minimal Assistance - Patient > 75% Lateral/Scoot Transfers: Minimal Assistance - Patient > 75% (Min/CGA) Transfer (Assistive device): Database administrator / Additional Locomotion Stairs: No Wheelchair Mobility Wheelchair Mobility: Yes Wheelchair Assistance: Chartered loss adjuster: Both upper extremities Wheelchair Parts Management: Needs assistance Distance: 50 ft  Evaluation completed (see details above and below) with education on PT POC and goals and individual treatment initiated with focus on initiating functional mobility. Pt in recliner on arrival, no complaint of pain. Therapist retrieved and adjusted w/c and RW to appropriate heights. Pt performed Sit to stand with BUE on armrest of chair with min A throughout session to maintain WB on heel only. Pt propelled w/c with BUE  x 50 ft with supervision. Car transfer with CGA and RW. Pt and therapist brainstormed stair navigation. Backwards navigation likely best option, but pt expressed fear. Pt returned to room and returned to bed with stand pivot transfer with RW and min A. Pt was left with all needs in reach and alarm active.    Discharge Criteria: Patient will be discharged from PT if patient refuses treatment 3 consecutive times without medical reason, if treatment goals not met, if there is a change in medical status, if patient makes no progress towards goals or if patient is discharged from hospital.  The above assessment, treatment plan, treatment alternatives and goals were discussed and mutually agreed upon: by patient  Mickel Fuchs 02/08/2021, 12:33 PM

## 2021-02-08 NOTE — Progress Notes (Signed)
Pharmacy med review f/u:   D/W Dr. Wynn Banker. Bedtime SSI coverage added.    Shreshta Medley A. Jeanella Craze, PharmD, BCPS, FNKF Clinical Pharmacist [place] Please utilize Amion for appropriate phone number to reach the unit pharmacist [facility])

## 2021-02-08 NOTE — Evaluation (Signed)
Occupational Therapy Assessment and Plan  Patient Details  Name: Richard [name] MRN: [id number] Date of Birth: 31-Jan-1961  OT Diagnosis: abnormal posture, muscle weakness (generalized), and L BKA and new R 1st and 2nd ray amputation with wound vac Rehab Potential:   ELOS: 7-10 days   Today's Date: 02/08/2021 OT Individual Time: 0211-1735 OT Individual Time Calculation (min): 56 min     Hospital Problem: Principal Problem:   S/P BKA (below knee amputation), left (HCC) Active Problems:   Left below-knee amputee [facility])   Past Medical History:  Past Medical History:  Diagnosis Date   AICD (automatic cardioverter/defibrillator) present 01/11/2017   Anxiety    CHF (congestive heart failure) (HCC)    CKD (chronic kidney disease)    COPD (chronic obstructive pulmonary disease) ([place])    never smoker, industrial exposure   Diabetes mellitus with diabetic neuropathy, with long-term current use of insulin (HCC)    GERD (gastroesophageal reflux disease)    High cholesterol    History of gout    "take RX qd" (11/01/2017)   Hypertension    Nonobstructive atherosclerosis of coronary artery    On home oxygen therapy    "2L prn" (11/01/2017)   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HCC)    LLL, present for years   Systolic HF (heart failure) ([place])    Past Surgical History:  Past Surgical History:  Procedure Laterality Date   ABDOMINAL AORTOGRAM W/LOWER EXTREMITY N/A 02/01/2021   Procedure: ABDOMINAL AORTOGRAM W/LOWER EXTREMITY;  Surgeon: Serafina Mitchell, MD;  Location: [place] [facility];  Service: Cardiovascular;  Laterality: N/A;   AMPUTATION Left 01/21/2021   Procedure: AMPUTATION BELOW KNEE;  Surgeon: Newt Minion, MD;  Location: [place];  Service: Orthopedics;  Laterality: Left;   AMPUTATION Right 02/04/2021   Procedure: RIGHT FOOT 1ST AND 2ND RAY AMPUTATION;  Surgeon: Newt Minion, MD;  Location: [place];  Service: Orthopedics;  Laterality: Right;   APPLICATION OF WOUND VAC Left 01/21/2021    Procedure: APPLICATION OF WOUND VAC;  Surgeon: Newt Minion, MD;  Location: [place];  Service: Orthopedics;  Laterality: Left;   APPLICATION OF WOUND VAC  02/04/2021   Procedure: APPLICATION OF WOUND VAC;  Surgeon: Newt Minion, MD;  Location: [place];  Service: Orthopedics;;   CATARACT EXTRACTION W/ INTRAOCULAR LENS IMPLANTW/ TRABECULECTOMY Left ~ 2013   "may have done this when I had my other eye OR"   [place] N/A 11/02/2017   Procedure: CORONARY STENT INTERVENTION;  Surgeon: Larey Dresser, MD;  Location: [place] [facility];  Service: Cardiovascular;  Laterality: N/A;   CORONARY STENT INTERVENTION N/A 11/02/2017   Procedure: CORONARY STENT INTERVENTION;  Surgeon: Belva Crome, MD;  Location: [place] [facility];  Service: Cardiovascular;  Laterality: N/A;   EYE SURGERY Left ~ 2013   "fell on something in basement; stuck in my eye"   ICD IMPLANT     Medtronic Dual Chamber 01/11/17   MULTIPLE TOOTH EXTRACTIONS     "pulled all my top teeth"   PERIPHERAL VASCULAR BALLOON ANGIOPLASTY  02/01/2021   Procedure: PERIPHERAL VASCULAR BALLOON ANGIOPLASTY;  Surgeon: Serafina Mitchell, MD;  Location: [place] [facility];  Service: Cardiovascular;;  TP Trunk / PT   RIGHT/LEFT HEART CATH AND CORONARY ANGIOGRAPHY N/A 11/02/2017   Procedure: RIGHT/LEFT HEART CATH AND CORONARY ANGIOGRAPHY;  Surgeon: Larey Dresser, MD;  Location: [place] [facility];  Service: Cardiovascular;  Laterality: N/A;  TEE WITHOUT CARDIOVERSION N/A 01/20/2021   Procedure: TRANSESOPHAGEAL ECHOCARDIOGRAM (TEE);  Surgeon: Larey Dresser, MD;  Location[facility];  Service: Cardiovascular;  Laterality: N/A;    Assessment & Plan Clinical Impression: Richard [name] is a 60 year old right-handed male with history of CKD stage III, type 2 diabetes mellitus with neuropathy, LLL hamartoma, COPD 2 L oxygen dependent, chronic combined CHF, chronic bilateral ulcers who was admitted 01/15/2021 with severe sepsis  due to group G bacteremia and acute on chronic respiratory failure due to CHF.  He was treated with IV diuresis as well as BiPAP.  He was started on broad-spectrum antibiotics with TEE for work-up per infectious disease.  TEE 7/28 showed ejection fraction of 15 to 20% with severely dilated LV and diffuse hypokinesis but was negative for vegetation, ASD, PFO and showed mild MR.  Left foot was suspected as source of infection and Dr. Sharol Given was consulted for input.  Left foot CT showed swelling and soft tissue gas concerning for necrotizing infection a possibility of early osteomyelitis as well as nondisplaced calcaneal process fracture around lateral aspect.  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  Blood pressure stabilized, cardiac medications were resumed.  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  White count slowly trended down and ID recommended narrowing antibiotics to pen G with recommendations to continue antibiotics for 2 additional days.  CT chest done due to issues of shortness of breath and showed benign lesion as well as no acute findings.  PCCM consulted and reported that this was not a new finding as mass consistent with hematoma per prior biopsy and had been slowly growing.  They recommended having patient follow-up with weight for pulmonary after discharge.  He was admitted to inpatient rehab services 01/27/2021 for comprehensive therapy.  Vascular surgery consulted 01/31/2021 for abnormal ABIs and was discharged to vascular surgery undergoing abdominal aortogram showing no significant aortoiliac or outflow disease/stenosis bilaterally.  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  He underwent successful crossing of the tibioperoneal trunk/posterior tibial  artery occlusion and subsequent balloon angioplasty with no residual stenosis.  Patient with progressive ischemic changes right foot/abscess undergoing right first and second ray amputations application of wound VAC 02/04/2021 per Dr. Sharol Given.  He remains on aspirin and Plavix therapy.  He has completed all antibiotic course.  Therapy evaluations resumed due to patient decreased functional ability was readmitted to resume inpatient rehab services. Patient transferred to CIR on 02/07/2021 .    Patient currently requires mod with basic self-care skills secondary to muscle weakness, decreased cardiorespiratoy endurance, decreased coordination and decreased motor planning, and decreased sitting balance, decreased postural control, decreased balance strategies, and difficulty maintaining precautions.  Prior to hospitalization, patient could complete BADL and IADL with independent .  Patient will benefit from skilled intervention to decrease level of assist with basic self-care skills, increase independence with basic self-care skills, and increase level of independence with iADL prior to discharge home with care partner for PRN/occasional assist.  Anticipate patient will require intermittent supervision and follow up home health.  OT - End of Session Activity Tolerance: Tolerates 30+ min activity with multiple rests Endurance Deficit: Yes OT Assessment OT Barriers to Discharge: Wound Care;Weight bearing restrictions OT Patient demonstrates impairments in the following area(s): Balance;Cognition;Endurance;Motor;Pain;Safety;Sensory;Skin Integrity OT  Basic ADL's Functional Problem(s): Grooming;Bathing;Dressing;Toileting OT Advanced ADL's Functional Problem(s): Simple Meal Preparation OT Transfers Functional Problem(s): Toilet OT Additional Impairment(s): None OT Plan OT Intensity: Minimum of 1-2 x/day, 45 to 90 minutes OT Frequency: 5 out of 7 days OT Duration/Estimated Length of Stay: 7-10 days OT  Treatment/Interventions: Balance/vestibular training;DME/adaptive equipment instruction;Patient/family education;Therapeutic Activities;Wheelchair propulsion/positioning;Therapeutic Exercise;Psychosocial support;Cognitive remediation/compensation;Community reintegration;Functional mobility training;Self Care/advanced ADL retraining;UE/LE Strength taining/ROM;Discharge planning;Skin care/wound managment;UE/LE Coordination activities;Disease mangement/prevention;Pain management;Neuromuscular re-education;Splinting/orthotics OT Self Feeding Anticipated Outcome(s): no goal OT Basic Self-Care Anticipated Outcome(s): Supervision B&B, Mod I toilet OT Toileting Anticipated Outcome(s): Mod I OT Bathroom Transfers Anticipated Outcome(s): Mod I toilet OT Recommendation Recommendations for Other Services: Therapeutic Recreation consult Therapeutic Recreation Interventions: Other (comment) (anxiety/stress management) Patient destination: Home Follow Up Recommendations: Home health OT Equipment Recommended: To be determined   OT Evaluation Precautions/Restrictions  Precautions Precautions: Fall;Other (comment) Precaution Comments: Recent 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); R foot portable wound vac Required Braces or Orthoses: Other Brace Other Brace: L BKA limb guard (notably soiled, pink foam still with crusty blood); R foot post-op shoe Restrictions Weight Bearing Restrictions: Yes RLE Weight Bearing: Non weight bearing LLE Weight Bearing: Non weight bearing Other Position/Activity Restrictions: Per MD note for R foot: "Weightbearing: Ideally nonweightbearing on the right but touchdown weightbearing on the heel would be acceptable". Pain Pain Assessment Pain Scale: 0-10 Pain Score: 0-No pain Home Living/Prior Functioning Home Living Family/patient expects to be discharged to:: Private residence Living Arrangements: Spouse/significant other, Children Vision Baseline Vision/History: No visual deficits (pt reports  not having glasses but says he "needs glasses" vision WFL for ADL tasks at eval) Patient Visual Report: No change from baseline Vision Assessment?: No apparent visual deficits Perception  Perception: Within Functional Limits Praxis Praxis: Intact Cognition Overall Cognitive Status: Within Functional Limits for tasks assessed Arousal/Alertness: Awake/alert Orientation Level: Person;Place;Situation Person: Oriented Place: Oriented Situation: Oriented Year: Other (Comment) ("I dont know" but after given 3 choices chose 2022) Month:  (pt did not know and did not want to provide guess) Day of Week: Correct Memory: Impaired Memory Impairment: Decreased recall of new information Immediate Memory Recall: Sock;Blue;Bed Memory Recall Sock: Without Cue Memory Recall Blue: Without Cue Memory Recall Bed: Not able to recall Focused Attention: Appears intact Sustained Attention: Appears intact Awareness: Impaired Problem Solving: Appears intact Safety/Judgment: Appears intact Sensation Sensation Light Touch: Impaired by gross assessment Light Touch Impaired Details: Impaired RLE;Impaired RUE;Impaired LUE (reports decreased in hands and RLE limited d/t wound vac and recent surgery) Proprioception: Impaired by gross assessment Coordination Gross Motor Movements are Fluid and Coordinated: No Fine Motor Movements are Fluid and Coordinated: Yes Coordination and Movement Description: Affected by altered center of mass due to amputations and sensation deficits Finger Nose Finger Test: [facility] but slightly dysmetric Motor  Motor Motor: Other (comment) Motor - Skilled Clinical Observations: grossly limited d/t amputations and altered center of mass  Trunk/Postural Assessment  Cervical Assessment Cervical Assessment: Exceptions to [facility] Thoracic Assessment Thoracic Assessment: Exceptions to [facility] Lumbar Assessment Lumbar Assessment: Exceptions to [facility] Postural Control Postural Control: Deficits on  evaluation  Balance Balance Balance Assessed: Yes Static Sitting Balance Static Sitting - Balance Support: Feet supported (RLE only with surgical shoe) Static Sitting - Level of Assistance: 6: Modified independent (Device/Increase time) Dynamic Sitting Balance Dynamic Sitting - Balance Support: No upper extremity supported;Feet supported;During functional activity (RLE only with surgical shoe) Dynamic Sitting - Level of Assistance: 5: Stand by assistance Dynamic Sitting - Balance Activities: Lateral lean/weight shifting;Forward lean/weight shifting;Reaching for objects Sitting balance -  Comments: Required assist to don R foot post op shoe Extremity/Trunk Assessment RUE Assessment RUE Assessment: Within Functional Limits LUE Assessment LUE Assessment: Within Functional Limits  Care Tool Care Tool Self Care Eating    Set up    Oral Care    Oral Care Assist Level: Set up assist    Bathing   Body parts bathed by patient: Right arm;Left arm;Chest;Abdomen;Front perineal area;Buttocks;Right upper leg;Face;Left upper leg Body parts bathed by helper: Right lower leg Body parts n/a: Left lower leg Assist Level: Minimal Assistance - Patient > 75%    Upper Body Dressing(including orthotics)   What is the patient wearing?: Pull over shirt   Assist Level: Set up assist    Lower Body Dressing (excluding footwear)   What is the patient wearing?: Pants Assist for lower body dressing: Total Assistance - Patient < 25%    Putting on/Taking off footwear    Max A          Care Tool Toileting Toileting activity    Mod A     Care Tool Bed Mobility Roll left and right activity   Roll left and right assist level: Contact Guard/Touching assist    Sit to lying activity   Sit to lying assist level: Supervision/Verbal cueing    Lying to sitting edge of bed activity   Lying to sitting edge of bed assist level: Supervision/Verbal cueing     Care Tool Transfers Sit to stand transfer Sit  to stand activity did not occur: Safety/medical concerns Sit to stand assist level: Minimal Assistance - Patient > 75%    Chair/bed transfer   Chair/bed transfer assist level: Minimal Assistance - Patient > 75%     Toilet transfer    Min/CGA     Care Tool Cognition Expression of Ideas and Wants Expression of Ideas and Wants: Without difficulty (complex and basic) - expresses complex messages without difficulty and with speech that is clear and easy to understand   Understanding Verbal and Non-Verbal Content Understanding Verbal and Non-Verbal Content: Understands (complex and basic) - clear comprehension without cues or repetitions   Memory/Recall Ability *first 3 days only Memory/Recall Ability *first 3 days only: Staff names and faces;That he or she is in a hospital/hospital unit    Refer to Care Plan for [place] 1 OT Short Term Goal 1 (Week 1): STGs = LTGs d/t ELOS  Recommendations for other services: Therapeutic Recreation  Stress management   Skilled Therapeutic Intervention ADL ADL Eating: Not assessed Grooming: Setup Upper Body Bathing: Setup Lower Body Bathing: Minimal assistance Where Assessed-Lower Body Bathing: Sitting at sink Upper Body Dressing: Setup Lower Body Dressing: Maximal assistance Toileting: Not assessed Toilet Transfer: Not assessed Mobility  Bed Mobility Bed Mobility: Rolling Right;Right Sidelying to Sit;Supine to Sit;Sitting - Scoot to Edge of Bed Rolling Right: Supervision/verbal cueing Right Sidelying to Sit: Supervision/Verbal cueing Supine to Sit: Supervision/Verbal cueing Sitting - Scoot to Edge of Bed: Supervision/Verbal cueing  Skilled Interventions: Pt greeted at time of session semireclined in bed resting agreeable to OT session. Discussion with pt regarding role and purpose of OT and pt agreeable. See above and below for details. Pt performed bed mobility Supervision to manage LE's over EOB and trunk,  assisted pt with donning limb guard and RLE post surgical shoe. Squat pivot/lateral scoot pivot to recliner with drop arm with Min/CGA for WB adherence through RLE heel only. Transported to sink and set up for UB/LB bathing CGA/Min A  seated with leans and anterior weight shift. UB dress set up and LB dress Max/total A with pt declining leans and wanting to lift buttocks. Set up with BLE's elevated in recliner for knee extension, alarm on call bell in reach. Wound vac plugged in to wall.    Discharge Criteria: Patient will be discharged from OT if patient refuses treatment 3 consecutive times without medical reason, if treatment goals not met, if there is a change in medical status, if patient makes no progress towards goals or if patient is discharged from hospital.  The above assessment, treatment plan, treatment alternatives and goals were discussed and mutually agreed upon: by patient  Viona Gilmore 02/08/2021, 12:19 PM

## 2021-02-08 NOTE — Plan of Care (Signed)
  Problem: RH Balance Goal: LTG: Patient will maintain dynamic sitting balance (OT) Description: LTG:  Patient will maintain dynamic sitting balance with assistance during activities of daily living (OT) Flowsheets (Taken 02/08/2021 1235) LTG: Pt will maintain dynamic sitting balance during ADLs with: Independent   Problem: RH Grooming Goal: LTG Patient will perform grooming w/assist,cues/equip (OT) Description: LTG: Patient will perform grooming with assist, with/without cues using equipment (OT) Flowsheets (Taken 02/08/2021 1235) LTG: Pt will perform grooming with assistance level of: Independent with assistive device    Problem: RH Bathing Goal: LTG Patient will bathe all body parts with assist levels (OT) Description: LTG: Patient will bathe all body parts with assist levels (OT) Flowsheets (Taken 02/08/2021 1235) LTG: Pt will perform bathing with assistance level/cueing: Supervision/Verbal cueing   Problem: RH Dressing Goal: LTG Patient will perform upper body dressing (OT) Description: LTG Patient will perform upper body dressing with assist, with/without cues (OT). Flowsheets (Taken 02/08/2021 1235) LTG: Pt will perform upper body dressing with assistance level of: Set up assist Goal: LTG Patient will perform lower body dressing w/assist (OT) Description: LTG: Patient will perform lower body dressing with assist, with/without cues in positioning using equipment (OT) Flowsheets (Taken 02/08/2021 1235) LTG: Pt will perform lower body dressing with assistance level of: Supervision/Verbal cueing   Problem: RH Toileting Goal: LTG Patient will perform toileting task (3/3 steps) with assistance level (OT) Description: LTG: Patient will perform toileting task (3/3 steps) with assistance level (OT)  Flowsheets (Taken 02/08/2021 1235) LTG: Pt will perform toileting task (3/3 steps) with assistance level: Independent with assistive device   Problem: RH Simple Meal Prep Goal: LTG Patient will  perform simple meal prep w/assist (OT) Description: LTG: Patient will perform simple meal prep with assistance, with/without cues (OT). Flowsheets (Taken 02/08/2021 1235) LTG: Pt will perform simple meal prep with assistance level of: Independent with assistive device   Problem: RH Toilet Transfers Goal: LTG Patient will perform toilet transfers w/assist (OT) Description: LTG: Patient will perform toilet transfers with assist, with/without cues using equipment (OT) Flowsheets (Taken 02/08/2021 1235) LTG: Pt will perform toilet transfers with assistance level of: Independent with assistive device

## 2021-02-08 NOTE — Progress Notes (Signed)
Patient ID: Richard [name], male   DOB: 1961-01-20, 60 y.o.   MRN: [id number]  Psychosocial Assessment:  Patient Details  Name: Richard [name] MRN: [id number] Date of Birth: 06-Mar-1961   Today's Date: 01/28/2021   Hospital Problems: Active Problems:   Below-knee amputation of left lower extremity [facility])   Past Medical History:      Past Medical History:  Diagnosis Date   AICD (automatic cardioverter/defibrillator) present 01/11/2017   Anxiety     CHF (congestive heart failure) (HCC)     CKD (chronic kidney disease)     COPD (chronic obstructive pulmonary disease) (HCC)      never smoker, industrial exposure   Diabetes mellitus with diabetic neuropathy, with long-term current use of insulin (HCC)     GERD (gastroesophageal reflux disease)     High cholesterol     History of gout      "take RX qd" (11/01/2017)   Hypertension     Nonobstructive atherosclerosis of coronary artery     On home oxygen therapy      "2L prn" (11/01/2017)   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HCC)      LLL, present for years   Systolic HF (heart failure) (HCC)      Past Surgical History:       Past Surgical History:  Procedure Laterality Date   AMPUTATION Left 01/21/2021    Procedure: AMPUTATION BELOW KNEE;  Surgeon: Nadara Mustard, MD;  Location: [facility] OR;  Service: Orthopedics;  Laterality: Left;   APPLICATION OF WOUND VAC Left 01/21/2021    Procedure: APPLICATION OF WOUND VAC;  Surgeon: Nadara Mustard, MD;  Location: [facility] OR;  Service: Orthopedics;  Laterality: Left;   CATARACT EXTRACTION W/ INTRAOCULAR LENS IMPLANTW/ TRABECULECTOMY Left ~ 2013    "may have done this when I had my other eye OR"   CIRCUMCISION       CORONARY STENT INTERVENTION N/A 11/02/2017    Procedure: CORONARY STENT INTERVENTION;  Surgeon: Laurey Morale, MD;  Location: [facility] INVASIVE CV LAB;  Service: Cardiovascular;  Laterality: N/A;   CORONARY STENT INTERVENTION N/A 11/02/2017    Procedure: CORONARY STENT INTERVENTION;  Surgeon: Lyn Records, MD;   Location: [facility] INVASIVE CV LAB;  Service: Cardiovascular;  Laterality: N/A;   EYE SURGERY Left ~ 2013    "fell on something in basement; stuck in my eye"   ICD IMPLANT        Medtronic Dual Chamber 01/11/17   MULTIPLE TOOTH EXTRACTIONS        "pulled all my top teeth"   RIGHT/LEFT HEART CATH AND CORONARY ANGIOGRAPHY N/A 11/02/2017    Procedure: RIGHT/LEFT HEART CATH AND CORONARY ANGIOGRAPHY;  Surgeon: Laurey Morale, MD;  Location: [facility] INVASIVE CV LAB;  Service: Cardiovascular;  Laterality: N/A;   TEE WITHOUT CARDIOVERSION N/A 01/20/2021    Procedure: TRANSESOPHAGEAL ECHOCARDIOGRAM (TEE);  Surgeon: Laurey Morale, MD;  Location: [facility] ENDOSCOPY;  Service: Cardiovascular;  Laterality: N/A;    Social History:  reports that he has quit smoking. His smoking use included cigarettes. He has never used smokeless tobacco. He reports previous alcohol use. He reports that he does not use drugs.   Family / Support Systems Marital Status: Divorced Patient Roles: Partner, Parent Spouse/Significant Other: [name] Napper-girlfriend [phone] Children: Pt has eight children range in age from 49-21 yo 22 yo daughter lives with him Other Supports: Helen-Mom [phone] Anticipated Caregiver: Self Ability/Limitations of Caregiver: [name] and family can only provide supervision level and may be alone at  times Caregiver Availability: Other (Comment) (At times may be alone) Family Dynamics: Close knit with his chidlren he reports he see's and or they call him to check on him. [name] is supportive and will make sure he has what he needs at discharge.   Social History Preferred language: English Religion: Baptist Cultural Background: No issues Education: HS Read: Yes Write: Yes Employment Status: Disabled Marine scientist Issues: No issues Guardian/Conservator: None-according to MD pt is capable of making his own decisions while here    Abuse/Neglect Abuse/Neglect Assessment Can Be Completed: Yes Physical  Abuse: Denies Verbal Abuse: Denies Sexual Abuse: Denies Exploitation of patient/patient's resources: Denies Self-Neglect: Denies   Emotional Status Pt's affect, behavior and adjustment status: Pt reports he manages and was still independent even with his need for chronic 02. He stays home mostly but doies get out. He wants to get back to his independent level before he goes home. He is a large man and feels a wheelchair may not be accessible in his home Recent Psychosocial Issues: other health issues-chronic 02 put still was independent Psychiatric History: History of anxiety takes medications for and feels they help but he is having more with his amputation due to not moving as well. Will see if neuro-psych will see while here Substance Abuse History: No issues   Patient / Family Perceptions, Expectations & Goals Pt/Family understanding of illness & functional limitations: Pt can explain his amputation and feels he has a good understanding of his plan going forward. He is hopeful he will heal and get a prothesis soon but know it is all dependent on his healing. 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, broadcasting/film/video, father, retiree, friend, etc Anticipated changes in roles/activities/participation: resume Pt/family expectations/goals: Pt states: " I want to be moving with a walker, I don;t want a wheelchair, it won't fit in my house."  [name] states: " I hope he does well there."   [employer]: None Premorbid Home Care/DME Agencies: Other (Comment) ([employer] [facility] O2 and neb at home) Transportation available at discharge: Self will rely upon family and mary now Resource referrals recommended: Neuropsychology   Discharge Planning Living Arrangements: Spouse/significant other, Children Support Systems: Spouse/significant other, Children, Other relatives, Friends/neighbors Type of Residence: Private residence Insurance Resources: [employer], [employer] (specify  county) Surveyor, quantity Resources: [employer] Screen Referred: No Living Expenses: Psychologist, sport and exercise Management: Patient, Significant Other Does the patient have any problems obtaining your medications?: No Home Management: [name] Patient/Family Preliminary Plans: Return home with [name] and daughter who lives with him. There will be times he will be alone and needs to be safe. He wants to just need a rolling walker instead of a wheelchair does not feel it is accessible in his home. Will await therapy evaluations and work on discharge needs. Care Coordinator Anticipated Follow Up Needs: HH/OP   Clinical Impression Pleasant motivated gentleman who is wiling to work in therapies to achieve his goal of mod/I with rolling walker. His partner-mary and children are supportive and will assist some. Await therapy evaluations and work on discharge needs. Due to pt's large size a wheelchair will probably not be functional in his home.   8/16 Await re-evaluation and work on discharge needs.   Lucy Chris 01/28/2021, 12:28 PM             Note Details  Author Lucy Chris, LCSW File Time 01/28/2021 12:30 PM  Author Type Social Worker Status Signed  Last Editor Jona Erkkila, Lemar Livings, LCSW Service Physical Medicine and Rehabilitation  Hospital Acct # [id number] Admit Date 02/07/2021

## 2021-02-08 NOTE — Progress Notes (Signed)
Physical Therapy Session Note  Patient Details  Name: Richard [name] MRN: [id number] Date of Birth: 1960/11/29  Today's Date: 02/08/2021 PT Individual Time: 1445-1530 PT Individual Time Calculation (min): 45 min   Short Term Goals: Week 1:  PT Short Term Goal 1 (Week 1): =LTGs d/t ELOS  Skilled Therapeutic Interventions/Progress Updates:   Pt seated in w/c on arrival and agreeable to therapy. No complaint of pain. Pt noted to have small nick on R shin, which had clotted. Cleansed with wet cloth and dressed with adhesive bandage, nurse aware. Session focused on .breathing with activity to prevent feelings of dizziness.  Pt performed the following exercises to promote strength and endurance:  -w/c push ups 2 x 10, pt reported feeling woozy after first set. BP=95/77, recovered to 106/74 with deep breathing -LAQ 2 x 10 BIL -Marches 2 x 10 BIL  Pt instructed on breathing with exercise and was able to perform with simple activity. Pt returned to bed with scoot transfer with supervision. Pt remained in bed and was left with all needs in reach and alarm active.   Ambulation/gait training;Cognitive remediation/compensation;Community reintegration;Discharge planning;DME/adaptive equipment instruction;Functional mobility training;Neuromuscular re-education;Pain management;Patient/family education;Psychosocial support;Skin care/wound management;Stair training;Therapeutic Activities;Therapeutic Exercise;UE/LE Strength taining/ROM;UE/LE Coordination activities;Wheelchair propulsion/positioning   Therapy Documentation Precautions:  Precautions Precautions: Fall, Other (comment) Precaution Comments: Recent 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); R foot portable wound vac Required Braces or Orthoses: Other Brace Other Brace: L BKA limb guard (notably soiled, pink foam still with crusty blood); R foot post-op shoe Restrictions Weight Bearing Restrictions: Yes RLE Weight Bearing: Non weight bearing LLE Weight Bearing: Non  weight bearing Other Position/Activity Restrictions: Per MD note for R foot: "Weightbearing: Ideally nonweightbearing on the right but touchdown weightbearing on the heel would be acceptable". General:   Vital Signs: Therapy Vitals Temp: 98.3 F (36.8 C) Temp Source: Oral Pulse Rate: 93 Resp: 18 BP: 97/74 Patient Position (if appropriate): Sitting Oxygen Therapy SpO2: 90 % O2 Device: Room Air Pain: Pain Assessment Pain Scale: 0-10 Pain Score: 0-No pain Mobility: Bed Mobility Bed Mobility: Sit to Supine Rolling Right: Supervision/verbal cueing Right Sidelying to Sit: Supervision/Verbal cueing Supine to Sit: Supervision/Verbal cueing Sitting - Scoot to Edge of Bed: Supervision/Verbal cueing Sit to Supine: Supervision/Verbal cueing Transfers Transfers: Sit to Stand;Stand to Sit Sit to Stand: Minimal Assistance - Patient > 75% Stand to Sit: Minimal Assistance - Patient > 75% Lateral/Scoot Transfers: Minimal Assistance - Patient > 75% (Min/CGA) Transfer (Assistive device): Rolling walker Locomotion : Stairs / Additional Locomotion Stairs: No Naval architect Mobility: Yes Wheelchair Assistance: Doctor, general practice: Both upper extremities Wheelchair Parts Management: Needs assistance Distance: 50 ft  Trunk/Postural Assessment : Cervical Assessment Cervical Assessment: Within Functional Limits (Simultaneous filing. User may not have seen previous data.) Thoracic Assessment Thoracic Assessment: Within Functional Limits (Simultaneous filing. User may not have seen previous data.) Lumbar Assessment Lumbar Assessment: Within Functional Limits (Simultaneous filing. User may not have seen previous data.) Postural Control Postural Control: Deficits on evaluation (Simultaneous filing. User may not have seen previous data.)  Balance: Balance Balance Assessed: Yes (Simultaneous filing. User may not have seen previous data.) Static  Sitting Balance Static Sitting - Balance Support: Feet supported (RLE only with surgical shoe) Static Sitting - Level of Assistance: 6: Modified independent (Device/Increase time) Dynamic Sitting Balance Dynamic Sitting - Balance Support: No upper extremity supported (Simultaneous filing. User may not have seen previous data.) Dynamic Sitting - Level of Assistance: 5: Stand by assistance (Simultaneous filing. User may not have seen previous  data.) Dynamic Sitting - Balance Activities: Lateral lean/weight shifting;Forward lean/weight shifting;Reaching for objects Sitting balance - Comments: Required assist to don R foot post op shoe Dynamic Standing Balance Dynamic Standing - Balance Support: Bilateral upper extremity supported Dynamic Standing - Level of Assistance: 4: Min assist Exercises:   Other Treatments:      Therapy/Group: Individual Therapy  [name] 02/08/2021, 3:20 PM

## 2021-02-08 NOTE — Progress Notes (Signed)
Inpatient Rehabilitation Center Individual Statement of Services  Patient Name:  Richard [name]  Date:  02/08/2021  Welcome to the Inpatient Rehabilitation Center.  Our goal is to provide you with an individualized program based on your diagnosis and situation, designed to meet your specific needs.  With this comprehensive rehabilitation program, you will be expected to participate in at least 3 hours of rehabilitation therapies Monday-Friday, with modified therapy programming on the weekends.  Your rehabilitation program will include the following services:  Physical Therapy (PT), Occupational Therapy (OT), 24 hour per day rehabilitation nursing, Therapeutic Recreaction (TR), Care Coordinator, Rehabilitation Medicine, Nutrition Services, and Pharmacy Services  Weekly team conferences will be held on Tuesday to discuss your progress.  Your Inpatient Rehabilitation Care Coordinator will talk with you frequently to get your input and to update you on team discussions.  Team conferences with you and your family in attendance may also be held.  Expected length of stay: 7-10 days  Overall anticipated outcome: independent with device  Depending on your progress and recovery, your program may change. Your Inpatient Rehabilitation Care Coordinator will coordinate services and will keep you informed of any changes. Your Inpatient Rehabilitation Care Coordinator's name and contact numbers are listed  below.  The following services may also be recommended but are not provided by the Inpatient Rehabilitation Center:   Home Health Rehabiltiation Services Outpatient Rehabilitation Services    Arrangements will be made to provide these services after discharge if needed.  Arrangements include referral to agencies that provide these services.  Your insurance has been verified to be:  medicare & medicaid Your primary doctor is:  Alehegn Asres  Pertinent information will be shared with your doctor and your  insurance company.  Inpatient Rehabilitation Care Coordinator:  Dossie Der, [place] [phone] or Luna Glasgow  Information discussed with and copy given to patient by: Lucy Chris, 02/08/2021, 10:18 AM

## 2021-02-08 NOTE — Progress Notes (Signed)
Occupational Therapy Session Note  Patient Details  Name: Richard [name] MRN: [id number] Date of Birth: [birth year]  Today's Date: 02/08/2021 OT Individual Time: 1410-1430 OT Individual Time Calculation (min): 20 min  and Today's Date: 02/08/2021 OT Missed Time: 10 Minutes Missed Time Reason: Other (comment) (previous pt care)   Short Term Goals: Week 1:  OT Short Term Goal 1 (Week 1): STGs = LTGs d/t ELOS  Skilled Therapeutic Interventions/Progress Updates:  Pt greeted at time of session 10 mins late d/t previous patient care, pt understanding and agreeable to OT session. Focus of session on squat pivot/scoot pivot transfers with limb guard donned for LLE and surgical shoe for RLE and pt CGA/Min A for squat pivot bed <> DABSC <> wheelchair with good hand/foot placement. While pt up in wheelchair, therapist changing bed linen as pt had spilled medicine/drink in bed. Up in wheelchair alarm on call bell in reach with LLE elevated.   Therapy Documentation Precautions:  Precautions Precautions: Fall, Other (comment) Precaution Comments: Recent 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); R foot portable wound vac Required Braces or Orthoses: Other Brace Other Brace: L BKA limb guard (notably soiled, pink foam still with crusty blood); R foot post-op shoe Restrictions Weight Bearing Restrictions: Yes RLE Weight Bearing: Non weight bearing LLE Weight Bearing: Non weight bearing Other Position/Activity Restrictions: Per MD note for R foot: "Weightbearing: Ideally nonweightbearing on the right but touchdown weightbearing on the heel would be acceptable".     Therapy/Group: Individual Therapy  Erasmo Score 02/08/2021, 3:27 PM

## 2021-02-09 DIAGNOSIS — R7309 Other abnormal glucose: Secondary | ICD-10-CM

## 2021-02-09 DIAGNOSIS — G479 Sleep disorder, unspecified: Secondary | ICD-10-CM

## 2021-02-09 DIAGNOSIS — E1142 Type 2 diabetes mellitus with diabetic polyneuropathy: Secondary | ICD-10-CM

## 2021-02-09 DIAGNOSIS — I10 Essential (primary) hypertension: Secondary | ICD-10-CM

## 2021-02-09 LAB — GLUCOSE, CAPILLARY
Glucose-Capillary: 154 mg/dL — ABNORMAL HIGH (ref 70–99)
Glucose-Capillary: 105 mg/dL — ABNORMAL HIGH (ref 70–99)
Glucose-Capillary: 171 mg/dL — ABNORMAL HIGH (ref 70–99)
Glucose-Capillary: 117 mg/dL — ABNORMAL HIGH (ref 70–99)

## 2021-02-09 NOTE — Progress Notes (Signed)
Physical Therapy Session Note  Patient Details  Name: Richard [name] MRN: [id number] Date of Birth: 07-18-1960  Today's Date: 02/09/2021 PT Individual Time: 0907-1017 PT Individual Time Calculation (min): 70 min   Short Term Goals: Week 1:  PT Short Term Goal 1 (Week 1): =LTGs d/t ELOS  Skilled Therapeutic Interventions/Progress Updates:    Pt seated in w/c on arrival and agreeable to therapy. No complaint of pain.   Session focused on retrieving and trialling light weight w/c.  Efficiency trials with standard and light weight chair were as follows: Light weight- 10 sec and 10 pushes Standard- 11 sec and 10 pushes  Pt subjectively reports that light weight chair feels better and is easier to maneuver. Pt demoed improvement protecting hands from w/c spokes despite neuropathy d/t improved efficiency.   Pt also performed scoot transfers to/from both chairs and mat table several times, with supervision and cues for hand placement. Sit>supine>prone with supervision for extended hip extension stretch. While pt stretched discussed blood sugar maintenance in regards to dizzy spells. Pt reported food insecurity which prevents him from eating well for his diabetes at home. Therapist offered resources on food pantries in the area and pt accepted. Plan to give handout with resources at next session.   Pt propelled w/c with BUE back to room and reported improved efficiency and comfort in light weight chair. Pt returned to bed after session with supervision scoot transfer, was left with all needs in reach and alarm active.   Therapy Documentation Precautions:  Precautions Precautions: Fall, Other (comment) Precaution Comments: Recent 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); R foot portable wound vac Required Braces or Orthoses: Other Brace Other Brace: L BKA limb guard (notably soiled, pink foam still with crusty blood); R foot post-op shoe Restrictions Weight Bearing Restrictions: Yes RLE Weight Bearing: Non weight  bearing LLE Weight Bearing: Non weight bearing Other Position/Activity Restrictions: Per MD note for R foot: "Weightbearing: Ideally nonweightbearing on the right but touchdown weightbearing on the heel would be acceptable".    Therapy/Group: Individual Therapy  [name] 02/09/2021, 4:13 PM

## 2021-02-09 NOTE — Progress Notes (Signed)
Occupational Therapy Session Note  Patient Details  Name: Richard [name] MRN: [id number] Date of Birth: 1960-06-28  Today's Date: 02/09/2021 OT Group Time: 3361-2244 OT Group Time Calculation (min): 60 min   Short Term Goals: Week 1:  OT Short Term Goal 1 (Week 1): STGs = LTGs d/t ELOS  Skilled Therapeutic Interventions/Progress Updates:  Pt participated in group session with a focus on stress mgmt, education on healthy coping strategies, and social interaction.  Session focus on breaking down stressors into "daily hassles," "major life stressors" and "life circumstances" in effort to allow pts to chunk their stressors into groups. Pt actively sharing stressors and contributing to group conversation. Offered education on factors that protect [place] against stress such as "daily uplifts," "healthy coping strategies" and "protective factors." Encouraged all group members to make an effort to actively recall one event from their day that was a daily uplift in an effort to protect their mindset from stressors. Issued pt handouts on healthy coping strategies to implement into routine.  Pt transported back to room by RT.  Of note pts wound vac beeping during session, RT to retrieve charger and beeping stopped, alerted RT to let nurse check wound vac upon arrival back to room.   Therapy Documentation Precautions:  Precautions Precautions: Fall, Other (comment) Precaution Comments: Recent 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); R foot portable wound vac Required Braces or Orthoses: Other Brace Other Brace: L BKA limb guard (notably soiled, pink foam still with crusty blood); R foot post-op shoe Restrictions Weight Bearing Restrictions: Yes RLE Weight Bearing: Non weight bearing LLE Weight Bearing: Non weight bearing Other Position/Activity Restrictions: Per MD note for R foot: "Weightbearing: Ideally nonweightbearing on the right but touchdown weightbearing on the heel would be acceptable".  Pain: Pt reports no pain  during session.    Therapy/Group: Group Therapy  Barron Schmid 02/09/2021, 3:44 PM

## 2021-02-09 NOTE — Progress Notes (Signed)
PROGRESS NOTE   Subjective/Complaints: Patient seen sitting up in his chair this AM, working with therapies.  He state he slept fairly overnight.  He states his kidneys are causing him back pain and states this is a chronic issue-attempted to educate patient.  ROS: Denies CP, SOB, N/V/D  Objective:   No results found. Recent Labs    02/08/21 0523  WBC 8.7  HGB 10.8*  HCT 34.1*  PLT 242    Recent Labs    02/08/21 0523  NA 135  K 3.6  CL 97*  CO2 30  GLUCOSE 90  BUN 24*  CREATININE 1.04  CALCIUM 8.7*     Intake/Output Summary (Last 24 hours) at 02/09/2021 1048 Last data filed at 02/09/2021 0900 Gross per 24 hour  Intake 840 ml  Output 1001 ml  Net -161 ml         Physical Exam: Vital Signs Blood pressure 114/74, pulse (!) 50, temperature 98 F (36.7 C), resp. rate 18, height 6\' 5"  (1.956 m), SpO2 (!) 89 %. Constitutional: No distress . Vital signs reviewed. HENT: Normocephalic.  Atraumatic. Eyes: EOMI. No discharge. Cardiovascular: No JVD.  RRR. Respiratory: Normal effort.  No stridor.  Bilateral clear to auscultation. GI: Non-distended.  BS +. Skin: Warm and dry.  Surgical sites with dressing CDI + VAC Psych: Normal mood.  Normal behavior. 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 Functional deficits which require 3+ hours per day of interdisciplinary therapy in a comprehensive inpatient rehab setting. Physiatrist is providing close team supervision and 24 hour management of active medical problems listed below. Physiatrist and rehab team continue to assess barriers to discharge/monitor patient progress toward functional and medical goals  Care Tool:  Bathing    Body parts bathed by patient: Right arm, Left arm, Chest, Abdomen, Front perineal  area, Buttocks, Right upper leg, Face, Left upper leg   Body parts bathed by helper: Right lower leg Body parts n/a: Left lower leg   Bathing assist Assist Level: Minimal Assistance - Patient > 75%     Upper Body Dressing/Undressing Upper body dressing   What is the patient wearing?: Pull over shirt    Upper body assist Assist Level: Set up assist    Lower Body Dressing/Undressing Lower body dressing      What is the patient wearing?: Pants     Lower body assist Assist for lower body dressing: Total Assistance - Patient < 25%     Toileting Toileting    Toileting assist Assist for toileting: Independent with assistive device Assistive Device Comment: urinal   Transfers Chair/bed transfer  Transfers assist  Chair/bed transfer activity did not occur: Safety/medical concerns  Chair/bed transfer assist level: Set up assist     Locomotion Ambulation   Ambulation assist   Ambulation activity did not occur: Safety/medical concerns          Walk 10 feet activity   Assist  Walk 10 feet activity did not occur: Safety/medical concerns        Walk 50 feet  activity   Assist Walk 50 feet with 2 turns activity did not occur: Safety/medical concerns         Walk 150 feet activity   Assist Walk 150 feet activity did not occur: Safety/medical concerns         Walk 10 feet on uneven surface  activity   Assist Walk 10 feet on uneven surfaces activity did not occur: Safety/medical concerns         Wheelchair     Assist Will patient use wheelchair at discharge?: Yes Type of Wheelchair: Manual Wheelchair activity did not occur: Safety/medical concerns  Wheelchair assist level: Supervision/Verbal cueing Max wheelchair distance: 50 ft    Wheelchair 50 feet with 2 turns activity    Assist    Wheelchair 50 feet with 2 turns activity did not occur: Safety/medical concerns   Assist Level: Supervision/Verbal cueing   Wheelchair 150  feet activity     Assist  Wheelchair 150 feet activity did not occur: Safety/medical concerns   Assist Level: Minimal Assistance - Patient > 75%   Blood pressure 114/74, pulse (!) 50, temperature 98 F (36.7 C), resp. rate 18, height 6\' 5"  (1.956 m), SpO2 (!) 89 %.  Medical Problem List and Plan: 1.  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  Wound VAC as directed  Continue CIR 2.  Antithrombotics: -DVT/anticoagulation: SCD right lower extremity Pharmaceutical: Lovenox             -antiplatelet therapy: Aspirin 81 mg daily and Plavix 75 mg daily 3. Pain Management: Neurontin 100 mg 3 times daily, oxycodone as needed  K pad ordered on 8/17 4. Mood: BuSpar 5 mg twice daily, Cymbalta 60 mg daily             -antipsychotic agents: N/A 5. Neuropsych: This patient is capable of making decisions on his own behalf. 6. Skin/Wound Care: Routine skin checks 7. Fluids/Electrolytes/Nutrition: Routine in and outs 8.  Acute on chronic anemia.    Hemoglobin 10.8 on 8/16  Continue to monitor 9.  Chronic combined CHF.  Monitor for any signs of fluid overload.  Aldactone 12.5 mg daily, Demadex 40 mg daily, Entresto 24-26 mg twice daily There were no vitals filed for this visit. 10.  COPD.  Check oxygen saturations every shift 11.  Diabetes mellitus or peripheral neuropathy.  Jardiance 10 mg daily, NovoLog 4 units 3 times daily, Semglee 13 units twice daily CBG (last 3)  Recent Labs    02/08/21 1648 02/08/21 2103 02/09/21 0604  GLUCAP 123* 121* 154*    Slightly labile on 8/17, monitor for trend 12.  History of gout.  Allopurinol 100 mg daily 13.  Hyperlipidemia.  Lipitor 14.  Hypertension.  Zebeta 2.5 mg daily, Imdur 30 mg daily Vitals:   02/08/21 1919 02/09/21 0326  BP: 94/68 114/74  Pulse: 90 (!) 50  Resp: 18 18  Temp: 99 F (37.2 C) 98 F (36.7 C)  SpO2: 92% (!) 89%   Relatively controlled on 8/17 15.   GERD.  Protonix 16.  Sleep disturbance  Melatonin started    LOS: 2 days A FACE TO FACE EVALUATION WAS PERFORMED  Xiomar Crompton 9/17 02/09/2021, 10:48 AM

## 2021-02-09 NOTE — Progress Notes (Signed)
Occupational Therapy Session Note  Patient Details  Name: Richard [name] MRN: [id number] Date of Birth: [birth year]  Today's Date: 02/09/2021 OT Individual Time:  730- 825   55 min   Short Term Goals: Week 1:  OT Short Term Goal 1 (Week 1): STGs = LTGs d/t ELOS  Skilled Therapeutic Interventions/Progress Updates:    Pt greeted at time of session sitting EOB finishing eating breakfast, c/o bilateral low back pain saying it is his "kidneys" and that he has had it before. RN entered at this time, relayed to nursing. Relayed to MD later on in session during rounding. Donned limb guard and surgical shoe EOB and squat pivot CGA bed > wheelchair. Self propel to sink, encouraging pt to manage wheelchair parts, leg rests, etc but difficulty d/t neuropathy. Oral hygiene and face washing set up at sink level. Discussion with pt throughout session regarding limiting dangerous activities like cooking and yard work 2/2 neuropathy, techniques to compensate visually when unable to feel. Self propel room > partially to ADL apartment and OT resuming for time management. Demonstrated for pt TTB transfer and pt liking this DME, wants one in future. Again educated that because w/c will not fit in bathroom at home and not cleared to shower, he will need this later on. Emphasized that he will not be walking when going home d/t WB precautions and will need to use BSC in room. Back in room set up alarm on call bell in reach and wound vac plugged in.   Therapy Documentation Precautions:  Precautions Precautions: Fall, Other (comment) Precaution Comments: Recent 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); R foot portable wound vac Required Braces or Orthoses: Other Brace Other Brace: L BKA limb guard (notably soiled, pink foam still with crusty blood); R foot post-op shoe Restrictions Weight Bearing Restrictions: Yes RLE Weight Bearing: Non weight bearing LLE Weight Bearing: Non weight bearing Other Position/Activity Restrictions: Per MD note  for R foot: "Weightbearing: Ideally nonweightbearing on the right but touchdown weightbearing on the heel would be acceptable".    Therapy/Group: Individual Therapy  Erasmo Score 02/09/2021, 7:10 AM

## 2021-02-09 NOTE — Progress Notes (Signed)
Patient ID: Richard [name], male   DOB: March 03, 1961, 60 y.o.   MRN: [id number] Met with pt who was on the telephone and would not get off to inform of team conference goals of supervision-mod/I wheelchair level goals and target discharge date 8/24. Have asked team to do have follow up with specialized wheelchair evaluation due to how tall pt is. Will work on discharge needs. Pt nodded he heard worker.

## 2021-02-10 LAB — GLUCOSE, CAPILLARY
Glucose-Capillary: 170 mg/dL — ABNORMAL HIGH (ref 70–99)
Glucose-Capillary: 83 mg/dL (ref 70–99)
Glucose-Capillary: 147 mg/dL — ABNORMAL HIGH (ref 70–99)
Glucose-Capillary: 125 mg/dL — ABNORMAL HIGH (ref 70–99)

## 2021-02-10 NOTE — Progress Notes (Signed)
Occupational Therapy Session Note  Patient Details  Name: Richard [name] MRN: [id number] Date of Birth: 09-Sep-1960  Today's Date: 02/10/2021 OT Individual Time: 1030-1059 OT Individual Time Calculation (min): 29 min    Short Term Goals: Week 1:  OT Short Term Goal 1 (Week 1): STGs = LTGs d/t ELOS  Skilled Therapeutic Interventions/Progress Updates:  Pt seated in w/c agreeable to OT intervention. Pt reporting fatiue, requesting to return to bed but agreeable to introduction to BUE HEP with level 3 theraband. Pt completed lateral scoot transfer from w/c> EOB with CGA. Pt completed x10 reps of bilateral shoulder flexion, bicep curls, chest press, tricep extensions and punches forward from EOB. Pt completed all therex from EOB with MIN cues for proper body mechanics. Issued pt HEP to increase carryover. Pt left supine in bed with bed alarm activaed and all needs within reach.   Therapy Documentation Precautions:  Precautions Precautions: Fall, Other (comment) Precaution Comments: Recent 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); R foot portable wound vac Required Braces or Orthoses: Other Brace Other Brace: L BKA limb guard (notably soiled, pink foam still with crusty blood); R foot post-op shoe Restrictions Weight Bearing Restrictions: Yes RLE Weight Bearing: Non weight bearing LLE Weight Bearing: Non weight bearing Other Position/Activity Restrictions: Per MD note for R foot: "Weightbearing: Ideally nonweightbearing on the right but touchdown weightbearing on the heel would be acceptable".   Pain: pt reports no pain during session.     Therapy/Group: Individual Therapy  Pollyann Glen [facility] 02/10/2021, 3:23 PM

## 2021-02-10 NOTE — Progress Notes (Addendum)
PROGRESS NOTE   Subjective/Complaints:  No issues overnight patient's pain control is good having good BMs ROS: Denies CP, SOB, N/V/D  Objective:   No results found. Recent Labs    02/08/21 0523  WBC 8.7  HGB 10.8*  HCT 34.1*  PLT 242    Recent Labs    02/08/21 0523  NA 135  K 3.6  CL 97*  CO2 30  GLUCOSE 90  BUN 24*  CREATININE 1.04  CALCIUM 8.7*     Intake/Output Summary (Last 24 hours) at 02/10/2021 0909 Last data filed at 02/10/2021 0657 Gross per 24 hour  Intake 480 ml  Output 950 ml  Net -470 ml         Physical Exam: Vital Signs Blood pressure (!) 151/110, pulse 67, temperature 98.3 F (36.8 C), resp. rate 17, height 6\' 5"  (1.956 m), SpO2 96 %.  General: No acute distress Mood and affect are appropriate Heart: Regular rate and rhythm no rubs murmurs or extra sounds Lungs: Clear to auscultation, breathing unlabored, no rales or wheezes Abdomen: Positive bowel sounds, soft nontender to palpation, nondistended Extremities: No clubbing, cyanosis, or edema    Skin: Warm and dry.  Surgical sites with dressing CDI + VAC Psych: Normal mood.  Normal behavior. 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 Functional deficits which require 3+ hours per day of interdisciplinary therapy in a comprehensive inpatient rehab setting. Physiatrist is providing close team supervision and 24 hour management of active medical problems listed below. Physiatrist and rehab team continue to assess barriers to discharge/monitor patient progress toward functional and medical goals  Care Tool:  Bathing    Body parts bathed by patient: Right arm, Left arm, Chest, Abdomen, Front perineal area, Buttocks, Right upper leg, Face, Left upper leg   Body parts bathed by  helper: Right lower leg Body parts n/a: Left lower leg   Bathing assist Assist Level: Minimal Assistance - Patient > 75%     Upper Body Dressing/Undressing Upper body dressing   What is the patient wearing?: Pull over shirt    Upper body assist Assist Level: Set up assist    Lower Body Dressing/Undressing Lower body dressing      What is the patient wearing?: Pants     Lower body assist Assist for lower body dressing: Moderate Assistance - Patient 50 - 74%     Toileting Toileting    Toileting assist Assist for toileting: Independent with assistive device Assistive Device Comment: urinal   Transfers Chair/bed transfer  Transfers assist  Chair/bed transfer activity did not occur: Safety/medical concerns  Chair/bed transfer assist level: Minimal Assistance - Patient > 75%     Locomotion Ambulation   Ambulation assist   Ambulation activity did not occur: Safety/medical concerns          Walk 10 feet activity   Assist  Walk 10 feet activity did not occur: Safety/medical concerns        Walk 50 feet activity   Assist Walk 50 feet with 2 turns activity did  not occur: Safety/medical concerns         Walk 150 feet activity   Assist Walk 150 feet activity did not occur: Safety/medical concerns         Walk 10 feet on uneven surface  activity   Assist Walk 10 feet on uneven surfaces activity did not occur: Safety/medical concerns         Wheelchair     Assist Will patient use wheelchair at discharge?: Yes Type of Wheelchair: Manual Wheelchair activity did not occur: Safety/medical concerns  Wheelchair assist level: Supervision/Verbal cueing Max wheelchair distance: 50 ft    Wheelchair 50 feet with 2 turns activity    Assist    Wheelchair 50 feet with 2 turns activity did not occur: Safety/medical concerns   Assist Level: Supervision/Verbal cueing   Wheelchair 150 feet activity     Assist  Wheelchair 150 feet  activity did not occur: Safety/medical concerns   Assist Level: Minimal Assistance - Patient > 75%   Blood pressure (!) 151/110, pulse 67, temperature 98.3 F (36.8 C), resp. rate 17, height 6\' 5"  (1.956 m), SpO2 96 %.  Medical Problem List and Plan: 1.  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  Wound VAC as directed, checked with orthopedic surgery, generally used from 1 week however given the continued drainage will continue this until early next week.  Continue CIR PT, OT 2.  Antithrombotics: -DVT/anticoagulation: SCD right lower extremity Pharmaceutical: Lovenox             -antiplatelet therapy: Aspirin 81 mg daily and Plavix 75 mg daily 3. Pain Management: Neurontin 100 mg 3 times daily, oxycodone as needed  K pad ordered on 8/17 4. Mood: BuSpar 5 mg twice daily, Cymbalta 60 mg daily             -antipsychotic agents: N/A 5. Neuropsych: This patient is capable of making decisions on his own behalf. 6. Skin/Wound Care: Routine skin checks, discussed duration of wound vac with ortho  7. Fluids/Electrolytes/Nutrition: Routine in and outs 8.  Acute on chronic anemia.    Hemoglobin 10.8 on 8/16  Continue to monitor 9.  Chronic combined CHF.  Monitor for any signs of fluid overload.  Aldactone 12.5 mg daily, Demadex 40 mg daily, Entresto 24-26 mg twice daily There were no vitals filed for this visit. 10.  COPD.  Check oxygen saturations every shift 11.  Diabetes mellitus or peripheral neuropathy.  Jardiance 10 mg daily, NovoLog 4 units 3 times daily, Semglee 13 units twice daily CBG (last 3)  Recent Labs    02/09/21 1634 02/09/21 2110 02/10/21 0527  GLUCAP 171* 117* 170*    Fair control  12.  History of gout.  Allopurinol 100 mg daily 13.  Hyperlipidemia.  Lipitor 14.  Hypertension.  Zebeta 2.5 mg daily, Imdur 30 mg daily, Entresto, torsemide  Vitals:   02/09/21 1938 02/10/21 0528  BP: (!) 115/36 (!)  151/110  Pulse: 66 67  Resp: 17 17  Temp: 98 F (36.7 C) 98.3 F (36.8 C)  SpO2: 92% 96%   Elevated this am will monitor prior to med changes  15.  GERD.  Protonix 16.  Sleep disturbance  Melatonin started    LOS: 3 days A FACE TO FACE EVALUATION WAS PERFORMED  02/12/21 02/10/2021, 9:09 AM

## 2021-02-10 NOTE — Progress Notes (Signed)
Occupational Therapy Session Note  Patient Details  Name: Richard [name] MRN: [id number] Date of Birth: 1960/12/23  Today's Date: 02/10/2021 OT Individual Time: 1300-1355 OT Individual Time Calculation (min): 55 min    Short Term Goals: Week 1:  OT Short Term Goal 1 (Week 1): STGs = LTGs d/t ELOS  Skilled Therapeutic Interventions/Progress Updates:    Pt resting in w/c upon arrival and agreeable to therapy. OT intervention with focus on functional transfers, w/c propulsion, BUE therex, and activity tolerance to increase indepdence with BADLs. Scoot transfer bed>w/c with supervision. Pt propelled w/c to ortho gym for BUE therex. OTA repaired Lt brake on w/c which came off before transfer to EOM. Chest presses 4x10 with 5# bar. Rowing 3x20 with 5# bar. Scoot transfers w/c<>EOM with supervision. Pt propelled w/c back to room and transferred to EOB. Pt remained in bed with all needs within reach and bed alarm activated. Nursing secretary notified to notify pt's nurse that wound vac is beeping.   Therapy Documentation Precautions:  Precautions Precautions: Fall, Other (comment) Precaution Comments: Recent 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); R foot portable wound vac Required Braces or Orthoses: Other Brace Other Brace: L BKA limb guard (notably soiled, pink foam still with crusty blood); R foot post-op shoe Restrictions Weight Bearing Restrictions: Yes RLE Weight Bearing: Non weight bearing LLE Weight Bearing: Non weight bearing Other Position/Activity Restrictions: Per MD note for R foot: "Weightbearing: Ideally nonweightbearing on the right but touchdown weightbearing on the heel would be acceptable".  Pain:  Pt denies pain this morning   Therapy/Group: Individual Therapy  Rich Brave 02/10/2021, 2:17 PM

## 2021-02-10 NOTE — IPOC Note (Signed)
Overall Plan of Care [facility]) Patient Details Name: Richard [name] MRN: [id number] DOB: [birth year]  Admitting Diagnosis: S/P BKA (below knee amputation), left [facility])  Hospital Problems: Principal Problem:   S/P BKA (below knee amputation), left (HCC) Active Problems:   Left below-knee amputee (HCC)   Sleep disturbance   Essential hypertension   Diabetic peripheral neuropathy (HCC)   Labile blood glucose     Functional Problem List: Nursing Edema, Endurance, Medication Management, Pain, Safety, Skin Integrity  PT Balance, Edema, Motor, Pain, Sensory, Skin Integrity, Safety  OT Balance, Cognition, Endurance, Motor, Pain, Safety, Sensory, Skin Integrity  SLP    TR         Basic ADL's: OT Grooming, Bathing, Dressing, Toileting     Advanced  ADL's: OT Simple Meal Preparation     Transfers: PT Bed to Chair, Car, Oncologist: PT Ambulation, Psychologist, prison and probation services, Stairs     Additional Impairments: OT None  SLP        TR      Anticipated Outcomes Item Anticipated Outcome  Self Feeding no goal  Swallowing      Basic self-care  Supervision B&B, Mod I toilet  Toileting  Mod I   Bathroom Transfers Mod I toilet  Bowel/Bladder  n/a  Transfers  supervision standing, mod I scoot  Locomotion  mod I w/c  Communication     Cognition     Pain  < 3  Safety/Judgment  Supervision   Therapy Plan: PT Intensity: Minimum of 1-2 x/day ,45 to 90 minutes PT Frequency: 5 out of 7 days PT Duration Estimated Length of Stay: 7-10 days OT Intensity: Minimum of 1-2 x/day, 45 to 90 minutes OT Frequency: 5 out of 7 days OT Duration/Estimated Length of Stay: 7-10 days     Due to the current state of emergency, patients may not be receiving their 3-hours of Medicare-mandated therapy.   Team Interventions: Nursing Interventions Patient/Family Education, Disease Management/Prevention, Pain Management, Medication Management, Skin Care/Wound Management,  Discharge Planning  PT interventions Ambulation/gait training, Cognitive remediation/compensation, Community reintegration, Discharge planning, DME/adaptive equipment instruction, Functional mobility training, Neuromuscular re-education, Pain management, Patient/family education, Psychosocial support, Skin care/wound management, Stair training, Therapeutic Activities, Therapeutic Exercise, UE/LE Strength taining/ROM, UE/LE Coordination activities, Wheelchair propulsion/positioning  OT Interventions Warden/ranger, DME/adaptive equipment instruction, Patient/family education, Therapeutic Activities, Wheelchair propulsion/positioning, Therapeutic Exercise, Psychosocial support, Cognitive remediation/compensation, Community reintegration, Functional mobility training, Self Care/advanced ADL retraining, UE/LE Strength taining/ROM, Discharge planning, Skin care/wound managment, UE/LE Coordination activities, Disease mangement/prevention, Pain management, Neuromuscular re-education, Splinting/orthotics  SLP Interventions    TR Interventions    SW/CM Interventions Discharge Planning, Psychosocial Support, Patient/Family Education   Barriers to Discharge MD  Medical stability, IV antibiotics, Wound care, and Weight  Nursing Decreased caregiver support, Home environment access/layout, Wound Care, Lack of/limited family support, Weight, Weight bearing restrictions, Medication compliance, Behavior Patient lives in a multilevel home with 4 steps in front, 3 steps on side. Rails in front of home but not on side. Patient able to live on main level with bedroom/bathroom. Family can provide supervision level assistance, light physical assistance, and will be available intermittently.  PT Inaccessible home environment, Decreased caregiver support, Home environment access/layout, Wound Care, Lack of/limited family support, Weight, Weight bearing restrictions    OT Wound Care, Weight bearing restrictions     SLP      SW       Team Discharge Planning: Destination: PT-Home ,OT- Home , SLP-  Projected Follow-up: PT-Home  health PT, OT-  Home health OT, SLP-  Projected Equipment Needs: PT-Wheelchair (measurements), Rolling walker with 5" wheels, OT- To be determined, SLP-  Equipment Details: PT- , OT-  Patient/family involved in discharge planning: PT- Patient,  OT-Patient, SLP-   MD ELOS: 14-16d Medical Rehab Prognosis:  Good Assessment: 60 year old right-handed male with history of CKD stage III, type 2 diabetes mellitus with neuropathy, LLL hamartoma, COPD 2 L oxygen dependent, chronic combined CHF, chronic bilateral ulcers who was admitted 01/15/2021 with severe sepsis due to group G bacteremia and acute on chronic respiratory failure due to CHF.  He was treated with IV diuresis as well as BiPAP.  He was started on broad-spectrum antibiotics with TEE for work-up per infectious disease.  TEE 7/28 showed ejection fraction of 15 to 20% with severely dilated LV and diffuse hypokinesis but was negative for vegetation, ASD, PFO and showed mild MR.  Left foot was suspected as source of infection and Dr. Lajoyce Corners was consulted for input.  Left foot CT showed swelling and soft tissue gas concerning for necrotizing infection a possibility of early osteomyelitis as well as nondisplaced calcaneal process fracture around lateral aspect.  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  Blood pressure stabilized, cardiac medications were resumed.  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  White count slowly trended down and ID recommended narrowing antibiotics to pen G with recommendations to continue antibiotics for 2 additional days.  CT chest done due to issues of shortness of breath and showed benign lesion as well as no acute findings.   PCCM consulted and reported that this was not a new finding as mass consistent with hematoma per prior biopsy and had been slowly growing.  They recommended having patient follow-up with weight for pulmonary after discharge.  He was admitted to inpatient rehab services 01/27/2021 for comprehensive therapy.  Vascular surgery consulted 01/31/2021 for abnormal ABIs and was discharged to vascular surgery undergoing abdominal aortogram showing no significant aortoiliac or outflow disease/stenosis bilaterally.  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  He underwent successful crossing of the tibioperoneal trunk/posterior tibial artery occlusion and subsequent balloon angioplasty with no residual stenosis.  Patient with progressive ischemic changes right foot/abscess undergoing right first and second ray amputations application of wound VAC 02/04/2021 per Dr. Lajoyce Corners.  He remains on aspirin and Plavix therapy.  He has completed all antibiotic course.  Therapy evaluations resumed due to patient decreased functional ability was readmitted to resume inpatient rehab services.     See Team Conference Notes for weekly updates to the plan of care

## 2021-02-10 NOTE — Progress Notes (Signed)
Occupational Therapy Session Note  Patient Details  Name: Richard [name] MRN: [id number] Date of Birth: 10-31-1960  Today's Date: 02/11/2021 OT Individual Time: 5830-9407 OT Individual Time Calculation (min): 59 min   Short Term Goals: Week 1:  OT Short Term Goal 1 (Week 1): STGs = LTGs d/t ELOS  Skilled Therapeutic Interventions/Progress Updates:    Pt greeted in the w/c, pain manageable for tx without additional interventions. He already spot washed at the sink this AM, bathing needs reportedly met. Pt dressed and wearing his post op shoe. He also did not have to use the restroom at this point in time. Pt self propelled w/c to the dayroom to engage in therapeutic exercise. Note that when pt tried to lock his brakes the mechanism on one side popped off. Issued pt a new w/c for safety, +2 for lateral scoot to new w/c. While pt was seated unsupported in the w/c, OT guided him through core exercises with 2 kg ball x15 reps Rt>Lt lateral rotations and anterior/posterior weight shifts, education emphasis placed on mindful trunk activation vs utilizing UE compensatory strategies. Also guided him through gentle cervical and back stretches as a preparatory activity prior to exercise engagement. Encouraged daily stretching of UB to prevent UB strain/injury, emphasizing importance due to postural implications of w/c level activity/mobility. He verbalized understanding. At end of session pt self propelled the w/c back to his room, remained sitting up in the w/c. Left him with all needs within reach and safety belt fastened.   Therapy Documentation Precautions:  Precautions Precautions: Fall, Other (comment) Precaution Comments: Recent 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); R foot portable wound vac Required Braces or Orthoses: Other Brace Other Brace: L BKA limb guard (notably soiled, pink foam still with crusty blood); R foot post-op shoe Restrictions Weight Bearing Restrictions: Yes RLE Weight Bearing: Non weight  bearing LLE Weight Bearing: Non weight bearing Other Position/Activity Restrictions: Per MD note for R foot: "Weightbearing: Ideally nonweightbearing on the right but touchdown weightbearing on the heel would be acceptable".  ADL: ADL Eating: Not assessed Grooming: Setup Upper Body Bathing: Setup Lower Body Bathing: Minimal assistance Where Assessed-Lower Body Bathing: Sitting at sink Upper Body Dressing: Setup Lower Body Dressing: Maximal assistance Toileting: Not assessed Toilet Transfer: Not assessed     Therapy/Group: Individual Therapy  Damondre Pfeifle A Arthella Headings 02/11/2021, 12:39 PM

## 2021-02-10 NOTE — Progress Notes (Signed)
Physical Therapy Session Note  Patient Details  Name: Richard [name] MRN: [id number] Date of Birth: 05-27-1961  Today's Date: 02/10/2021 PT Individual Time: [phone] PT Individual Time Calculation (min): 55 min, 60 min  Short Term Goals: Week 1:  PT Short Term Goal 1 (Week 1): =LTGs d/t ELOS  Skilled Therapeutic Interventions/Progress Updates:    AM session: Pt seated EOB on arrival eating breakfast w/ nsg present for meds pass. Therapist returned after ~10 min to begin session. Pt participated in PROM for L knee extension and quad sets to maintain knee ROM. Donned limb guard and surgical shoe with assist. Pt performed scoot transfer to w/c with supervision and navigated in room distances to sink to wash face. Pt propelled w/c with BUE to/from therapy gum with supervision. Scoot transfer w/c<>mat table with supervision including setting up chair for transfer. Pt directed in sidelying hip abduction, prone hip extensions, and SLR for L hip strength in preparation for prosthetic use. Pt propelled chair back to room and transferred to drop arm commode with supervision and assist to set up in small space. Care handed off to NT at end of session to complete toileting.   PM session: pt received in bed and agreeable to therapy. No complaint of pain. Pt donned surgical shoe with assist. Scoot transfer to w/c with supervision. Pt propelled w/c with BUE x 200+ ft multiple times throughout session. Intermittent education on propelling to keep fingers clear of spokes for skin protection 2/2 neuropathy. Pt propelled chair up/down ramp x 2 for improved mobility in the community. Furniture transfer to low couch with supervision and assistance with leg rest d/t neuropathy. Pt returned to room and transferred to EOB, performed LAQ with LLE 3 x 10 for LE ROM. Discussed d/c planning throughout session. Pt returned to bed after session and was left with all needs in reach and alarm active.   Therapy  Documentation Precautions:  Precautions Precautions: Fall, Other (comment) Precaution Comments: Recent 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); R foot portable wound vac Required Braces or Orthoses: Other Brace Other Brace: L BKA limb guard (notably soiled, pink foam still with crusty blood); R foot post-op shoe Restrictions Weight Bearing Restrictions: Yes RLE Weight Bearing: Non weight bearing LLE Weight Bearing: Non weight bearing Other Position/Activity Restrictions: Per MD note for R foot: "Weightbearing: Ideally nonweightbearing on the right but touchdown weightbearing on the heel would be acceptable".    Therapy/Group: Individual Therapy  [name] 02/10/2021, 9:20 AM

## 2021-02-11 ENCOUNTER — Other Ambulatory Visit (HOSPITAL_COMMUNITY): Payer: Self-pay | Admitting: Cardiology

## 2021-02-11 LAB — GLUCOSE, CAPILLARY
Glucose-Capillary: 141 mg/dL — ABNORMAL HIGH (ref 70–99)
Glucose-Capillary: 93 mg/dL (ref 70–99)
Glucose-Capillary: 136 mg/dL — ABNORMAL HIGH (ref 70–99)
Glucose-Capillary: 122 mg/dL — ABNORMAL HIGH (ref 70–99)

## 2021-02-11 NOTE — Progress Notes (Addendum)
Occupational Therapy Session Note  Patient Details  Name: Richard [name] MRN: [id number] Date of Birth: [birth year]  Today's Date: 02/12/2021 OT Individual Time: [phone] and [phone] OT Individual Time Calculation (min): 54 min and 26 min   Short Term Goals: Week 1:  OT Short Term Goal 1 (Week 1): STGs = LTGs d/t ELOS  Skilled Therapeutic Interventions/Progress Updates:    Pt greeted in bed, receiving morning medicine including pain medicine from RN. Pt agreeable to session. Started with donning post op shoe and limb guard, pt needing Max A due to sensation deficits in hands and limited functional LE mobility bilaterally to meet task demands. Supervision for lateral scoot<w/c with OT stabilizing equipment. Throughout session, we worked on pt setting up DME himself in prep for scoot transfers. OT needing to increase contrast on armrests in order for pt to elevate himself as needed. Mod A for donning limb rest + leg rests, pt limited functionally by sensation + visual deficits. He self propelled w/c to the therapy apartment, needing 1 rest break due to decreased activity tolerance. We then practiced TTB transfers using lateral scoot technique, pt will need a TTB for home. Reviewed limb wrapping for when he is medically cleared to shower, OT demonstrating this. Pt then self propelled back to the room, reporting not feeling/nauseated due to the medicine he had taken. Supervision for transfer back to the bed where limb guard and post op shoe were doffed. Pt remained comfortably in bed at close of session, all needs within reach and bed alarm set. OT also provided him with an antinausea aromatherapy blend.   We also checked supplies for w/c gloves with none present that will fit him. He would benefit from large w/c gloves when they are back in stock.   2nd Session 1:1 tx (26 min) Pt greeted in bed, stating that he felt fatigued from previous therapy. Agreeable to earlier than scheduled tx. Continued  education regarding muscular imbalances resulting from w/c level activity that negatively impacts posture during ADL activity. Printed pt a HEP for stretches, emphasizing gentle ROM/stretching of spine and pectoralis major/minor. We reviewed exercises together, 10 reps each exercise. Encouraged stretching during his daily routine to prevent UB injury/strain. Pt agreeable. Left him EOB with all needs within reach and bed alarm set.   Therapy Documentation Precautions:  Precautions Precautions: Fall, Other (comment) Precaution Comments: Recent 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); R foot portable wound vac Required Braces or Orthoses: Other Brace Other Brace: L BKA limb guard (notably soiled, pink foam still with crusty blood); R foot post-op shoe Restrictions Weight Bearing Restrictions: Yes RLE Weight Bearing: Non weight bearing LLE Weight Bearing: Non weight bearing Other Position/Activity Restrictions: Per MD note for R foot: "Weightbearing: Ideally nonweightbearing on the right but touchdown weightbearing on the heel would be acceptable".  Vital Signs: Therapy Vitals Temp: 98.3 F (36.8 C) Temp Source: Oral Pulse Rate: 71 Resp: 14 BP: (!) 108/59 Patient Position (if appropriate): Lying Oxygen Therapy SpO2: 100 % O2 Device: Room Air Pain Assessment Pain Scale: 0-10 Pain Score: 5  Pain Location: Leg ADL: ADL Eating: Not assessed Grooming: Setup Upper Body Bathing: Setup Lower Body Bathing: Minimal assistance Where Assessed-Lower Body Bathing: Sitting at sink Upper Body Dressing: Setup Lower Body Dressing: Maximal assistance Toileting: Not assessed Toilet Transfer: Not assessed     Therapy/Group: Individual Therapy  Federico Maiorino A Adana Marik 02/12/2021, 12:48 PM

## 2021-02-11 NOTE — Progress Notes (Signed)
Physical Therapy Session Note  Patient Details  Name: Richard [name] MRN: [id number] Date of Birth: 1961-05-04  Today's Date: 02/11/2021 PT Individual Time: 1000-1100, 1300-1353 PT Individual Time Calculation (min): 60 min, 53 min  Short Term Goals: Week 1:  PT Short Term Goal 1 (Week 1): =LTGs d/t ELOS  Skilled Therapeutic Interventions/Progress Updates:  AM session: Pt seated in w/c on arrival and agreeable to therapy. No complaint of pain. Pt reports that LLE dressing has not been changed in several days, nursing aware. Stand pivot transfer with CGA and heavy cues to maintain WB through arms throughout transfer. Pt propelled w/c with BUE x 250 ft with multiple rest breaks, reporting that standard w/c was more difficult than lightweight and he had difficulty feeling pushrims. Pt then performed 3x10 w/c pushups for UE strength for transfers. BIL LAQ for [name] ROM and strength 3 x 10. Pt propelled back to room and was left seated in w/c with all needs in reach and alarm active.    PM session: Pt seated in w/c on arrival and agreeable to therapy. No complaint of pain. Pt propelled w/c with BUE  x 250 ft. Therapist applied theraband to push rims for improved grip to protect fingers from further injury. Pt reported greatly improved maneuverability. Plan to discuss options with w/c specialist on Monday for improved grip on push rims of custom chair. Pt performed scoot transfer w/c<> ADL apartment bed with supervision, VC assist for leg rest d/t neuropathy. Scoot transfer to mat table with supervision. Pt performed Sit ups 2 x 10 to physio ball for improved core strength in preparation for standing activity.  Twists w/ 6 lb med ball 3 x 12, tapping ball to mat table on either side of LE for improved postural control and sitting balance 2/2 decr BOS. Pt propelled w/c with BUE back to room and remained in char at end of session. Discussed group therapy session and coping skills learned for improved carryover.  Pt was left with all needs in reach and alarm active.   Therapy Documentation Precautions:  Precautions Precautions: Fall, Other (comment) Precaution Comments: Recent 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); R foot portable wound vac Required Braces or Orthoses: Other Brace Other Brace: L BKA limb guard (notably soiled, pink foam still with crusty blood); R foot post-op shoe Restrictions Weight Bearing Restrictions: Yes RLE Weight Bearing: Non weight bearing LLE Weight Bearing: Non weight bearing Other Position/Activity Restrictions: Per MD note for R foot: "Weightbearing: Ideally nonweightbearing on the right but touchdown weightbearing on the heel would be acceptable".     Therapy/Group: Individual Therapy  [name] 02/11/2021, 12:46 PM

## 2021-02-11 NOTE — Progress Notes (Signed)
Patient is 1 week status post right foot ray amputation.  He is also status post left below-knee amputation.  Was asked to see him by physical medicine and rehab because the patient had questions  Patient states that his wound VAC on his right foot has been alarming on and off for 4 days has now continuously alarmed.  He was wondering if this could be addressed  Wound VAC was removed.  Wound VAC was not functioning.  He has significant skin maceration but no surrounding cellulitis no ascending cellulitis wound is well opposed  Will order daily dressing changes and cleansing daily with antibacterial soap and water patient will need 1 week follow-up in our office

## 2021-02-11 NOTE — Progress Notes (Signed)
PROGRESS NOTE   Subjective/Complaints:  States prevena pump constantly beeping  Has not been emptied since here  Pt feels Right ankle swelling more but no increased pain  ROS: Denies CP, SOB, N/V/D  Objective:   No results found. No results for input(s): WBC, HGB, HCT, PLT in the last 72 hours.  No results for input(s): NA, K, CL, CO2, GLUCOSE, BUN, CREATININE, CALCIUM in the last 72 hours.   Intake/Output Summary (Last 24 hours) at 02/11/2021 0937 Last data filed at 02/11/2021 0732 Gross per 24 hour  Intake 480 ml  Output 1225 ml  Net -745 ml         Physical Exam: Vital Signs Blood pressure 116/72, pulse 72, temperature 98.2 F (36.8 C), resp. rate 18, height 6\' 5"  (1.956 m), SpO2 92 %.  General: No acute distress Mood and affect are appropriate Heart: Regular rate and rhythm no rubs murmurs or extra sounds Lungs: Clear to auscultation, breathing unlabored, no rales or wheezes Abdomen: Positive bowel sounds, soft nontender to palpation, nondistended Extremities: No clubbing, cyanosis, or edema   Skin: Warm and dry.  Surgical sites with dressing CDI + VAC Psych: Normal mood.  Normal behavior. 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 Functional deficits which require 3+ hours per day of interdisciplinary therapy in a comprehensive inpatient rehab setting. Physiatrist is providing close team supervision and 24 hour management of active medical problems listed below. Physiatrist and rehab team continue to assess barriers to discharge/monitor patient progress toward functional and medical goals  Care Tool:  Bathing    Body parts bathed by patient: Right arm, Left arm, Chest, Abdomen, Front perineal area, Buttocks, Right upper leg, Face, Left upper leg   Body  parts bathed by helper: Right lower leg Body parts n/a: Left lower leg   Bathing assist Assist Level: Minimal Assistance - Patient > 75%     Upper Body Dressing/Undressing Upper body dressing   What is the patient wearing?: Pull over shirt    Upper body assist Assist Level: Set up assist    Lower Body Dressing/Undressing Lower body dressing      What is the patient wearing?: Pants     Lower body assist Assist for lower body dressing: Moderate Assistance - Patient 50 - 74%     Toileting Toileting    Toileting assist Assist for toileting: Independent with assistive device Assistive Device Comment: urinal   Transfers Chair/bed transfer  Transfers assist  Chair/bed transfer activity did not occur: Safety/medical concerns  Chair/bed transfer assist level: Contact Guard/Touching assist     Locomotion Ambulation   Ambulation assist   Ambulation activity did not occur: Safety/medical concerns          Walk 10 feet activity   Assist  Walk 10 feet activity did not occur: Safety/medical concerns        Walk 50 feet activity   Assist Walk 50 feet with 2 turns activity did not occur: Safety/medical concerns         Walk 150 feet activity  Assist Walk 150 feet activity did not occur: Safety/medical concerns         Walk 10 feet on uneven surface  activity   Assist Walk 10 feet on uneven surfaces activity did not occur: Safety/medical concerns         Wheelchair     Assist Will patient use wheelchair at discharge?: Yes Type of Wheelchair: Manual Wheelchair activity did not occur: Safety/medical concerns  Wheelchair assist level: Supervision/Verbal cueing Max wheelchair distance: 200 ft    Wheelchair 50 feet with 2 turns activity    Assist    Wheelchair 50 feet with 2 turns activity did not occur: Safety/medical concerns   Assist Level: Supervision/Verbal cueing   Wheelchair 150 feet activity     Assist  Wheelchair  150 feet activity did not occur: Safety/medical concerns   Assist Level: Supervision/Verbal cueing   Blood pressure 116/72, pulse 72, temperature 98.2 F (36.8 C), resp. rate 18, height 6\' 5"  (1.956 m), SpO2 92 %.  Medical Problem List and Plan: 1.  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  Wound VAC as directed, checked with orthopedic surgery, generally used from 1 week however given the continued drainage will continue this until early next week.  Continue CIR PT, OT SLP 2.  Antithrombotics: -DVT/anticoagulation: SCD right lower extremity Pharmaceutical: Lovenox             -antiplatelet therapy: Aspirin 81 mg daily and Plavix 75 mg daily 3. Pain Management: Neurontin 100 mg 3 times daily, oxycodone as needed  K pad ordered on 8/17 4. Mood: BuSpar 5 mg twice daily, Cymbalta 60 mg daily             -antipsychotic agents: N/A 5. Neuropsych: This patient is capable of making decisions on his own behalf. 6. Skin/Wound Care: Routine skin checks, discussed duration of wound vac with ortho  7. Fluids/Electrolytes/Nutrition: Routine in and outs 8.  Acute on chronic anemia.    Hemoglobin 10.8 on 8/16  Continue to monitor 9.  Chronic combined CHF.  Monitor for any signs of fluid overload.  Aldactone 12.5 mg daily, Demadex 40 mg daily, Entresto 24-26 mg twice daily There were no vitals filed for this visit. 10.  COPD.  Check oxygen saturations every shift 11.  Diabetes mellitus or peripheral neuropathy.  Jardiance 10 mg daily, NovoLog 4 units 3 times daily, Semglee 13 units twice daily CBG (last 3)  Recent Labs    02/10/21 1653 02/10/21 2154 02/11/21 0610  GLUCAP 147* 125* 141*    Fair control  12.  History of gout.  Allopurinol 100 mg daily 13.  Hyperlipidemia.  Lipitor 14.  Hypertension.  Zebeta 2.5 mg daily, Imdur 30 mg daily, Entresto, torsemide  Vitals:   02/10/21 2136 02/11/21 0437  BP: 113/73 116/72   Pulse: 76 72  Resp: 18 18  Temp: 98.1 F (36.7 C) 98.2 F (36.8 C)  SpO2: 94% 92%   Elevated this am will monitor prior to med changes  15.  GERD.  Protonix 16.  Sleep disturbance  Melatonin started    LOS: 4 days A FACE TO FACE EVALUATION WAS PERFORMED  02/13/21 02/11/2021, 9:37 AM

## 2021-02-11 NOTE — Progress Notes (Signed)
Occupational Therapy Session Note  Patient Details  Name: Richard [name] MRN: [id number] Date of Birth: [birth year]  Today's Date: 02/11/2021 OT Individual Time: 1400-1429 OT Individual Time Calculation (min): 29 min    Short Term Goals: Week 1:  OT Short Term Goal 1 (Week 1): STGs = LTGs d/t ELOS  Skilled Therapeutic Interventions/Progress Updates:    Pt greeted in the w/c, pain manageable for tx without additional interventions. He wanted to engage in bathing/dressing tasks at the sink. Setup for UB self care. We worked on problem solving LB self care. Able to complete squat-stands weightbearing through B UEs and Rt heel only while OT assisted with lowering/elevating pants. Pt was adamant that he wanted to complete perihygiene himself in standing. Min A for balance at this time with vcs for maintaining Rt LE weightbearing precautions (wearing post op shoe). ?functional carryover. Pt remained in the w/c at close of session, all needs within reach and chair alarm set.   Therapy Documentation Precautions:  Precautions Precautions: Fall, Other (comment) Precaution Comments: Recent 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); R foot portable wound vac Required Braces or Orthoses: Other Brace Other Brace: L BKA limb guard (notably soiled, pink foam still with crusty blood); R foot post-op shoe Restrictions Weight Bearing Restrictions: Yes RLE Weight Bearing: Non weight bearing LLE Weight Bearing: Non weight bearing Other Position/Activity Restrictions: Per MD note for R foot: "Weightbearing: Ideally nonweightbearing on the right but touchdown weightbearing on the heel would be acceptable".  Vital Signs: Therapy Vitals Temp: 98.2 F (36.8 C) Temp Source: Oral Pulse Rate: 68 Resp: 20 BP: (!) 141/71 Patient Position (if appropriate): Sitting Oxygen Therapy SpO2: 100 % O2 Device: Room Air Pain:   ADL: ADL Eating: Not assessed Grooming: Setup Upper Body Bathing: Setup Lower Body Bathing: Minimal  assistance Where Assessed-Lower Body Bathing: Sitting at sink Upper Body Dressing: Setup Lower Body Dressing: Maximal assistance Toileting: Not assessed Toilet Transfer: Not assessed     Therapy/Group: Individual Therapy  Lyanna Blystone A Kjerstin Abrigo 02/11/2021, 3:56 PM

## 2021-02-12 LAB — GLUCOSE, CAPILLARY
Glucose-Capillary: 106 mg/dL — ABNORMAL HIGH (ref 70–99)
Glucose-Capillary: 132 mg/dL — ABNORMAL HIGH (ref 70–99)
Glucose-Capillary: 144 mg/dL — ABNORMAL HIGH (ref 70–99)
Glucose-Capillary: 155 mg/dL — ABNORMAL HIGH (ref 70–99)

## 2021-02-12 NOTE — Progress Notes (Signed)
Physical Therapy Session Note  Patient Details  Name: Richard [name] MRN: [id number] Date of Birth: 11/26/1960  Today's Date: 02/12/2021 PT Individual Time: 0800-0845 and 1305-1400 PT Individual Time Calculation (min): 55 min   Short Term Goals: Week 1:  PT Short Term Goal 1 (Week 1): =LTGs d/t ELOS  Skilled Therapeutic Interventions/Progress Updates:     Session 1: Patient in bed upon PT arrival. Patient alert and agreeable to PT session. Patient denied pain during session. Noted R foot ACE wrap and dressing coming off and obtained supplies for dressing change, RN made aware.   Upon removal of dressing, noted dressing with significant drainage, mostly dried, and minimal active drainage from the wound on inspection. RN called in to assess wound and drainage and MD made aware during rounding. Noted dry cracked skin on plantar surface of foot and heel. Washed with soap and water and applied daily moisturizer to skin, leaving a 1" space around the wound.  Applied non-adherent dressing, Kirlex, and 4"ACE wrap to R foot.   Removed ACE wrap and shrinker from L residual limb with total A. Upon inspection noted 2 small open areas along medial and central areas of the incision. RN and MD aware. Applied non-adherent dressing and Kirlex to prevent staple from snagging on shrinker then applied shrinker with total A. Educated on shrinker purpose, hygiene, and donning/doffing technique.  Educated on foot care, residual limb care, daily inspections, inspection mirror use, positioning for ROM, and management of blood sugar throughout session. Patient attentive and appreciative of education.  Patient in bed with RN in the room at end of session with breaks locked, bed alarm set, and all needs within reach.   Session 2: Patient in bed attempting to sleep upon PT arrival. Patient aroused and agreeable to PT session. Patient denied pain during session.  Therapeutic Activity: Bed Mobility: Patient performed  supine to/from sit with supervision in a flat bed without use of bed rails and on a mat table. Provided verbal cues for residual limb and foot protection. Donned/doffed limb protector with min A for distal strap placement and R post-op shoe with total A sitting EOB and EOM. Transfers: Patient performed lateral scoot transfers bed<>w/c and w/c<>mat table with supervision and set-up assist for w/c. Provided verbal cues for doffing leg rests with min A on L and total A on R, parallel parking technique to set-up for transfer, removal of arm rest with total A due to decreased finger dexterity, and heel touch down weight bearing only through post op shoe.  Wheelchair Mobility:  Patient propelled wheelchair ~115 feet x2 with supervision, limited by decreased activity tolerance. Provided verbal cues for use of palms of his hands on band for improved grip and increased stroke length to improve momentum with propulsion.   Neuromuscular Re-ed: Patient performed the following activities for improved hand-eye coordination, sitting balance, and upper extremity endurance: -shooting mesh ball into basket ball hoop x4 min -tossing ball back and forth with PT without back support x2 min Patient reports intermittent blurred and double vision and presents with L exophoria and reports that at his last eye exam they said "the sugar is affecting my eyes."  Therapeutic Exercise: Patient performed the following exercises with verbal and tactile cues for proper technique. -B LAQ x10 with 5 sec max extension and max flexion -B side-lying hip abduction -B side-lying hip extension -prone lying, only tolerated 10 sec due to abdominal discomfort -3 Part circuit x3: reclined seated trunk rotations with 2 lb ball x5,  B shoulder flexion with 2 lb ball held in both hands x10, seated rows without resistance with 2 sec hold for scapular retraction x10  Patient in bed at end of session with breaks locked, bed alarm set, and all needs  within reach.    Therapy Documentation Precautions:  Precautions Precautions: Fall, Other (comment) Precaution Comments: Recent 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); R foot portable wound vac Required Braces or Orthoses: Other Brace Other Brace: L BKA limb guard (notably soiled, pink foam still with crusty blood); R foot post-op shoe Restrictions Weight Bearing Restrictions: Yes RLE Weight Bearing: Non weight bearing LLE Weight Bearing: Non weight bearing Other Position/Activity Restrictions: Per MD note for R foot: "Weightbearing: Ideally nonweightbearing on the right but touchdown weightbearing on the heel would be acceptable".    Therapy/Group: Individual Therapy  Wendi Lastra L Eliane Hammersmith PT, DPT  02/12/2021, 4:51 PM

## 2021-02-12 NOTE — Progress Notes (Signed)
Occupational Therapy Session Note  Patient Details  Name: Izaan Kingbird MRN: [id number] Date of Birth: [birth year]  Today's Date: 02/13/2021 OT Individual Time: 1450-1532 OT Individual Time Calculation (min): 42 min    Short Term Goals: Week 1:  OT Short Term Goal 1 (Week 1): STGs = LTGs d/t ELOS  Skilled Therapeutic Interventions/Progress Updates:    Pt greeted while sitting EOB, reporting feeling nauseated after receiving medicine from RN just a while ago. Per pt, already received antinausea medicine. Min A for donning both post op shoe and limb guard prior to mobility. Supervision for lateral scoot<w/c with min cues for safe setup of DME. He then self propelled w/c to the ortho gym, needed 1 rest break in the hallway due to increase in nausea. Worked on UB strengthening using SciFit at level 6.5, pt instructed to pedal forward x5 min and backwards x2 min, pt requesting to pedal additional 3 minutes because he liked how his shoulders felt while using equipment. Afterwards pt transferred to the mat via lateral scoot. Worked on simulated LB dressing using theraband and lateral lean technique. Pt able to thread both LEs through the band and pull up over wait unassisted, multiple repetitions. Discussed functional implications of lateral leans during LB dressing + toileting tasks given his WB status. Note that pt had to lie down on the mat due to increased nausea. Prolonged rest break provided. When pt felt a bit better, he transferred back to the w/c and was returned to the room. Lateral scoot<bed completed with supervision and pt returned to supine. Left him with an antinausea aromatherapy blend + diet ginger ale, in care of RN. Informed RN of pts c/o nausea during session.   Therapy Documentation Precautions:  Precautions Precautions: Fall, Other (comment) Precaution Comments: Recent 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); R foot portable wound vac Required Braces or Orthoses: Other Brace Other Brace: L BKA limb guard  (notably soiled, pink foam still with crusty blood); R foot post-op shoe Restrictions Weight Bearing Restrictions: Yes RLE Weight Bearing: Non weight bearing LLE Weight Bearing: Non weight bearing Other Position/Activity Restrictions: Per MD note for R foot: "Weightbearing: Ideally nonweightbearing on the right but touchdown weightbearing on the heel would be acceptable".  Vital Signs: Therapy Vitals Temp: 98.5 F (36.9 C) Temp Source: Oral Pulse Rate: 71 Resp: 16 BP: 125/60 Patient Position (if appropriate): Lying Oxygen Therapy SpO2: 93 % O2 Device: Room Air Pain: pt reported being premedicated for pain   ADL: ADL Eating: Not assessed Grooming: Setup Upper Body Bathing: Setup Lower Body Bathing: Minimal assistance Where Assessed-Lower Body Bathing: Sitting at sink Upper Body Dressing: Setup Lower Body Dressing: Maximal assistance Toileting: Not assessed Toilet Transfer: Not assessed     Therapy/Group: Individual Therapy  Lakesha Levinson A Lulia Schriner 02/13/2021, 4:10 PM

## 2021-02-13 LAB — GLUCOSE, CAPILLARY
Glucose-Capillary: 96 mg/dL (ref 70–99)
Glucose-Capillary: 112 mg/dL — ABNORMAL HIGH (ref 70–99)
Glucose-Capillary: 122 mg/dL — ABNORMAL HIGH (ref 70–99)
Glucose-Capillary: 145 mg/dL — ABNORMAL HIGH (ref 70–99)

## 2021-02-13 MED ORDER — GABAPENTIN 100 MG PO CAPS
200.0000 mg | ORAL_CAPSULE | Freq: Three times a day (TID) | ORAL | Status: DC
  Administered 2021-02-13 – 2021-02-16 (×9): 200 mg via ORAL
  Filled 2021-02-13 (×9): qty 2

## 2021-02-13 NOTE — Progress Notes (Signed)
Patient remains alert and oriented x 4. Medicated for pain as requested with relief noted. Wound care performed as ordered. Surgical staples intact on left BKA. Shrinker applied as ordered. Surgical sutures noted on left surgical toes site. Dressing changed as ordered.  Voiding without any difficulty in urinal.

## 2021-02-13 NOTE — Progress Notes (Signed)
PROGRESS NOTE   Subjective/Complaints: Appreciate Ortho note , ordered daily dressing changes  Still has phantom pain ROS: Denies CP, SOB, N/V/D  Objective:   No results found. No results for input(s): WBC, HGB, HCT, PLT in the last 72 hours.  No results for input(s): NA, K, CL, CO2, GLUCOSE, BUN, CREATININE, CALCIUM in the last 72 hours.   Intake/Output Summary (Last 24 hours) at 02/13/2021 0905 Last data filed at 02/13/2021 0700 Gross per 24 hour  Intake 1298 ml  Output 3825 ml  Net -2527 ml         Physical Exam: Vital Signs Blood pressure 134/71, pulse 75, temperature 98 F (36.7 C), temperature source Oral, resp. rate 16, height 6\' 5"  (1.956 m), SpO2 100 %.  General: No acute distress Mood and affect are appropriate Heart: Regular rate and rhythm no rubs murmurs or extra sounds Lungs: Clear to auscultation, breathing unlabored, no rales or wheezes Abdomen: Positive bowel sounds, soft nontender to palpation, nondistended Extremities: No clubbing, cyanosis, or edema   Skin: Warm and dry.  Surgical sites with dressing Kerlex and ACE wrap Right foot   Psych: Normal mood.  Normal behavior. 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 Functional deficits which require 3+ hours per day of interdisciplinary therapy in a comprehensive inpatient rehab setting. Physiatrist is providing close team supervision and 24 hour management of active medical problems listed below. Physiatrist and rehab team continue to assess barriers to discharge/monitor patient progress toward functional and medical goals  Care Tool:  Bathing    Body parts bathed by patient: Right arm, Left arm, Chest, Abdomen, Front perineal area, Buttocks, Right upper leg, Face, Left upper leg   Body parts  bathed by helper: Right lower leg Body parts n/a: Left lower leg   Bathing assist Assist Level: Minimal Assistance - Patient > 75%     Upper Body Dressing/Undressing Upper body dressing   What is the patient wearing?: Pull over shirt    Upper body assist Assist Level: Set up assist    Lower Body Dressing/Undressing Lower body dressing      What is the patient wearing?: Pants     Lower body assist Assist for lower body dressing: Moderate Assistance - Patient 50 - 74%     Toileting Toileting    Toileting assist Assist for toileting: Independent with assistive device Assistive Device Comment: urinal   Transfers Chair/bed transfer  Transfers assist  Chair/bed transfer activity did not occur: Safety/medical concerns  Chair/bed transfer assist level: Supervision/Verbal cueing     Locomotion Ambulation   Ambulation assist   Ambulation activity did not occur: Safety/medical concerns          Walk 10 feet activity   Assist  Walk 10 feet activity did not occur: Safety/medical concerns        Walk 50 feet activity   Assist Walk 50 feet with 2 turns activity did not occur: Safety/medical concerns         Walk 150 feet activity   Assist Walk 150  feet activity did not occur: Safety/medical concerns         Walk 10 feet on uneven surface  activity   Assist Walk 10 feet on uneven surfaces activity did not occur: Safety/medical concerns         Wheelchair     Assist Will patient use wheelchair at discharge?: Yes Type of Wheelchair: Manual Wheelchair activity did not occur: Safety/medical concerns  Wheelchair assist level: Supervision/Verbal cueing Max wheelchair distance: 55 ft    Wheelchair 50 feet with 2 turns activity    Assist    Wheelchair 50 feet with 2 turns activity did not occur: Safety/medical concerns   Assist Level: Supervision/Verbal cueing   Wheelchair 150 feet activity     Assist  Wheelchair 150 feet  activity did not occur: Safety/medical concerns   Assist Level: Supervision/Verbal cueing   Blood pressure 134/71, pulse 75, temperature 98 F (36.7 C), temperature source Oral, resp. rate 16, height 6\' 5"  (1.956 m), SpO2 100 %.  Medical Problem List and Plan: 1.  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  Wound VAC as directed, checked with orthopedic surgery, generally used from 1 week however given the continued drainage will continue this until early next week.  Continue CIR PT, OT SLP 2.  Antithrombotics: -DVT/anticoagulation: SCD right lower extremity Pharmaceutical: Lovenox             -antiplatelet therapy: Aspirin 81 mg daily and Plavix 75 mg daily 3. Pain Management: Neurontin 100 mg 3 times daily will increase to 200mg  TID , oxycodone as needed  K pad ordered on 8/17 4. Mood: BuSpar 5 mg twice daily, Cymbalta 60 mg daily             -antipsychotic agents: N/A 5. Neuropsych: This patient is capable of making decisions on his own behalf. 6. Skin/Wound Care: Routine skin checks, discussed duration of wound vac with ortho  7. Fluids/Electrolytes/Nutrition: Routine in and outs 8.  Acute on chronic anemia.    Hemoglobin 10.8 on 8/16  Continue to monitor 9.  Chronic combined CHF.  Monitor for any signs of fluid overload.  Aldactone 12.5 mg daily, Demadex 40 mg daily, Entresto 24-26 mg twice daily There were no vitals filed for this visit. 10.  COPD.  Check oxygen saturations every shift 11.  Diabetes mellitus or peripheral neuropathy.  Jardiance 10 mg daily, NovoLog 4 units 3 times daily, Semglee 13 units twice daily CBG (last 3)  Recent Labs    02/12/21 1615 02/12/21 2110 02/13/21 0615  GLUCAP 144* 155* 96    Good control 8/21 12.  History of gout.  Allopurinol 100 mg daily 13.  Hyperlipidemia.  Lipitor 14.  Hypertension.  Zebeta 2.5 mg daily, Imdur 30 mg daily, Entresto, torsemide  Vitals:    02/12/21 2005 02/13/21 0415  BP: (!) 155/60 134/71  Pulse: 75 75  Resp: 16 16  Temp: 98.3 F (36.8 C) 98 F (36.7 C)  SpO2: 96% 100%   Controlled 8/21 15.  GERD.  Protonix 16.  Sleep disturbance  Melatonin started    LOS: 6 days A FACE TO FACE EVALUATION WAS PERFORMED  02/15/21 02/13/2021, 9:05 AM

## 2021-02-14 LAB — GLUCOSE, CAPILLARY
Glucose-Capillary: 150 mg/dL — ABNORMAL HIGH (ref 70–99)
Glucose-Capillary: 161 mg/dL — ABNORMAL HIGH (ref 70–99)
Glucose-Capillary: 170 mg/dL — ABNORMAL HIGH (ref 70–99)
Glucose-Capillary: 96 mg/dL (ref 70–99)

## 2021-02-14 MED ORDER — DICLOFENAC SODIUM 1 % EX GEL
2.0000 g | Freq: Four times a day (QID) | CUTANEOUS | Status: DC
  Administered 2021-02-14 – 2021-02-16 (×9): 2 g via TOPICAL
  Filled 2021-02-14: qty 100

## 2021-02-14 NOTE — Progress Notes (Signed)
Physical Therapy Session Note  Patient Details  Name: Richard [name] MRN: [id number] Date of Birth: [birth year]  Today's Date: 02/14/2021 PT Individual Time: 1300-1353 PT Individual Time Calculation (min): 53 min   Short Term Goals: Week 1:  PT Short Term Goal 1 (Week 1): =LTGs d/t ELOS  Skilled Therapeutic Interventions/Progress Updates:    Pt seated in w/c on arrival and agreeable to therapy. C/o pain in R hand, addressed with voltaren gel, therapy to tolerance. Fleet Contras present for w/c eval, measurements taken and plan to bring loaner chair before d/c.   Pt propelled chair x ~50 ft,  limited by pain in hand. Squat pivot transfer w/c <> mat table with supervision. Pt then performed self stretching of back and shoulder in sidelying and prone. Pt performed the following exercises to promote LE strength and endurance: sidelying abduction, prone hip extension.   Squat pivot to bed elevated to height of bed at home with supervision. Pt had to stand up on RLE to achieve transfer, discussed WB precautions and pt expressed understanding. Pt remained in bed after session and was left with all needs in reach and alarm active.   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   Therapy/Group: Individual Therapy  [name] 02/14/2021, 1:54 PM

## 2021-02-14 NOTE — Progress Notes (Signed)
Occupational Therapy Session Note  Patient Details  Name: Richard [name] MRN: [id number] Date of Birth: 10-Aug-1960  Today's Date: 02/14/2021 OT Individual Time: 1443-1540 and 1430-1500 OT Individual Time Calculation (min): 73 min and 30 min Missed 30 mins d/t nausea   Short Term Goals: Week 1:  OT Short Term Goal 1 (Week 1): STGs = LTGs d/t ELOS   Skilled Therapeutic Interventions/Progress Updates:    Session 1: Pt greeted at time of session sidelying in bed resting agreeable to OT session but stating he hasn't been feeling great, gout flare up in R hand. RN aware. Supine > sit Supervision and sitting EOB extensive discussion regarding DC planning, reviewing precautions, having assist at home. Pt donning limb guard with Min A to assist with straps but recommended one hand technique if gout in R hand is too painful. Donned surgical shoe with Min A as well. Focus of session on squat pivots (declined ADL as he washed up with nursing staff) with CGA/Supervision overall from bed > wheelchair and wheelchair <> BSC multiple trials figuring out best position for chair/BSC relationship. Therapist applied colored tape to [facility] handle clips for improved ability to locate 2/2 neuropathy. Self propel room > approx 50 feet and therapist resuming d/t hand pain. Simulated meal prep with having pt retrieve items from mid height and low cabinets to simulate gathering food. Pt did need cues to not stand x1 and remain seated for WB precautions. Back in room set up alarm on call bell in reach and hot pack forR hand per pt request and cleared with nursing.   Session 2: Pt greeted at time of session side lying sleeping but easily woken and agreeable to OT session with mild encouragement. Pt stating R hand is still hurting, RN entered at this time for med pass and aware. Supine > sit Supervision and donned surgical shoe with Min A. Rewrapped ACE bandage on RLE. Returned self to supine to use urinal with Mod I. Upon returning  to sitting position, reviewed SB transfers in case pt ever had pain in R foot or questionable skin integrity. Performed slide board bed > wheelchair Supervision. Pt up in chair stating he had intense nausea and needing to return to bed. Squat pivot back to bed Supervision. Adjusted limb guard to promote knee extension. Pt missed 30 mins of OT d/t nausea. Nursing aware.  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    Therapy/Group: Individual Therapy  Erasmo Score 02/14/2021, 7:21 AM

## 2021-02-14 NOTE — Discharge Summary (Signed)
Physician Discharge Summary  Patient ID: Richard [name] MRN: [id number] DOB/AGE: Oct 07, 1960 60 y.o.  Admit date: 02/07/2021 Discharge date: 02/16/2021  Discharge Diagnoses:  Principal Problem:   S/P BKA (below knee amputation), left (HCC) Active Problems:   Left below-knee amputee (HCC)   Sleep disturbance   Essential hypertension   Diabetic peripheral neuropathy (HCC)   Labile blood glucose DVT prophylaxis Chronic combined CHF COPD History of gout Hyperlipidemia Mood stabilization GERD CKD stage III Right hand pain  Discharged Condition: Stable  Significant Diagnostic Studies: CT ABDOMEN PELVIS WO CONTRAST  Result Date: 01/25/2021 CLINICAL DATA:  Sepsis.  Hiccups. EXAM: CT ABDOMEN AND PELVIS WITHOUT CONTRAST TECHNIQUE: Multidetector CT imaging of the abdomen and pelvis was performed following the standard protocol without IV contrast. COMPARISON:  08/10/2017 chest CT 11/24/2015 FINDINGS: Lower chest: Well defined left lower lobe pulmonary mass measures 3.5 x 3.3 cm. This lesion has been present since at least 11/24/2015 when it measured 2.5 x 2.4 cm. This was better evaluated on a recent chest CT of 12/17/2020. Hepatobiliary: No focal abnormality in the liver on this study without intravenous contrast. Layering tiny calcified gallstones evident. No intrahepatic or extrahepatic biliary dilation. Pancreas: No focal mass lesion. No dilatation of the main duct. No intraparenchymal cyst. No peripancreatic edema. Spleen: No splenomegaly. No focal mass lesion. Adrenals/Urinary Tract: 17 mm right adrenal nodule stable since 08/10/2017 compatible with benign etiology such is adenoma left adrenal gland unremarkable. Unremarkable noncontrast appearance of the kidneys. No evidence for hydroureter. The urinary bladder appears normal for the degree of distention. Stomach/Bowel: Stomach is decompressed. Duodenum is normally positioned as is the ligament of Treitz. No small bowel wall thickening. No  small bowel dilatation. The terminal ileum is normal. The appendix is normal. No gross colonic mass. No colonic wall thickening. Vascular/Lymphatic: No abdominal aortic aneurysm. There is no gastrohepatic or hepatoduodenal ligament lymphadenopathy. No retroperitoneal or mesenteric lymphadenopathy. No pelvic sidewall lymphadenopathy. Reproductive: The prostate gland and seminal vesicles are unremarkable. Other: No intraperitoneal free fluid. Musculoskeletal: No worrisome lytic or sclerotic osseous abnormality. Suspected changes of avascular necrosis noted right femoral head. IMPRESSION: 1. No acute findings in the abdomen or pelvis. Specifically, no findings to explain the patient's history of sepsis. 2. 3.5 x 3.3 cm left lower lobe pulmonary mass has been present since at least 11/24/2015 when it measured 2.5 x 2.4 cm. This lesion was better evaluated on a recent chest CT of 12/17/2020. 3. Cholelithiasis. 4. 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 Electronically Signed   By: Kennith Center M.D.   On: 01/25/2021 12:59   CT ANKLE LEFT WO CONTRAST  Result Date: 01/21/2021 CLINICAL DATA:  Non-pressure chronic ulcer, lower limb evaluate for osteomyelitis, dm2 and chronic ulcer left heel EXAM: CT OF THE LEFT ANKLE WITHOUT CONTRAST TECHNIQUE: Multidetector CT imaging of the left ankle was performed according to the standard protocol. Multiplanar CT image reconstructions were also generated. COMPARISON:  None. FINDINGS: Bones/Joint/Cartilage There is a nondisplaced anterior calcaneal process fracture along the lateral aspect. There is a plantar soft tissue wound in gas along the heel. There is minimal cortical irregularity, sclerosis, and periosteal reaction at the adjacent plantar aspect of the calcaneus. Tiny cortical defect on coronal image 84. This is process is located near the plantar fascia insertion. There is mild tibiotalar, mild posterior subtalar, and moderate middle subtalar  degenerative change. There is mild talonavicular and calcaneocuboid degenerative change. Ligaments Suboptimally assessed by CT. Muscles and Tendons No acute tendon injury on noncontrast  CT. Soft tissues There is ankle and foot soft tissue swelling. IMPRESSION: Plantar soft tissue wound with adjacent swelling and soft tissue gas concerning for necrotizing infection, with possible early changes of osteomyelitis involving the plantar aspect of the calcaneus near the plantar fascia insertion. No visible abscess within the field of view. Further evaluation with MRI could be considered to determine extent of involvement. Nondisplaced anterior calcaneal process fracture along the lateral aspect. Diffuse ankle and foot soft tissue swelling. Electronically Signed   By: Caprice Renshaw   On: 01/21/2021 07:16   PERIPHERAL VASCULAR CATHETERIZATION  Result Date: 02/01/2021 Formatting of this result is different from the original. Images from the original result were not included. Patient name: Richard [name] MRN: [id number] DOB: 1961-03-25 Sex: male 02/01/2021 Pre-operative Diagnosis: Right foot ulcer Post-operative diagnosis:  Same Surgeon:  [name] Procedure Performed:  1.  Ultrasound-guided access, left femoral artery  2.  Abdominal aortogram  3.  Right lower extremity runoff  4.  Angioplasty, right posterior tibial and tibioperoneal trunk  5.  Conscious sedation, 67 minutes  6.  Closure device, Celt Indications: The patient recently underwent left leg amputation and also has a wound on his right foot.  He comes in today for further evaluation Procedure:  The patient was identified in the holding area and taken to room 8.  The patient was then placed supine on the table and prepped and draped in the usual sterile fashion.  A time out was called.  Conscious sedation was administered with the use of IV fentanyl and Versed under continuous physician and nurse monitoring.  Heart rate, blood pressure, and oxygen saturation were  continuously monitored.  Total sedation time was 67 minutes.  Ultrasound was used to evaluate the left common femoral artery.  It was patent .  A digital ultrasound image was acquired.  A micropuncture needle was used to access the left common femoral artery under ultrasound guidance.  An 018 wire was advanced without resistance and a micropuncture sheath was placed.  The 018 wire was removed and a benson wire was placed.  The micropuncture sheath was exchanged for a 5 french sheath.  An omniflush catheter was advanced over the wire to the level of L-1.  An abdominal angiogram was obtained.  Next, using the omniflush catheter and a benson wire, the aortic bifurcation was crossed and the catheter was placed into theright external iliac artery and right runoff was obtained.  Findings:  Aortogram: No significant renal artery stenosis was identified.  The infrarenal abdominal aorta is widely patent without stenosis.  Bilateral common and external iliac arteries are widely patent without significant stenosis.  Right Lower Extremity: The right common femoral and profundofemoral artery are widely patent.  The right superficial femoral artery is widely patent.  The popliteal artery is widely patent.  There is occlusion of the tibioperoneal trunk with reconstitution of the posterior tibial artery which is the dominant runoff.  Intervention: After the above images were acquired the decision made to proceed with intervention.  A 6 French 55 cm Ansell 1 sheath was advanced over the bifurcation into the right common femoral artery.  I then used a V-18 wire with a 3 x 100 Sterling balloon as a support and was able to cross the occlusion and reenter in the posterior tibial artery which was confirmed with the catheter tip in the posterior tibial artery.  I then performed primary balloon angioplasty of the posterior tibial artery and tibioperoneal trunk with a 3 mm balloon  for 2 minutes.  Completion imaging revealed resolution of  the stenosis.  The patient now has inline flow through the posterior tibial artery to the foot.  A short 6 French sheath was placed and a Celt device was used for closure. 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  The posterior tibial artery reconstitutes and is the dominant runoff vessel  #3  Successful crossing of the tibioperoneal trunk/posterior tibial artery occlusion and subsequent balloon angioplasty with no residual stenosis  V. Durene CalWells Brabham, M.D., [facility] [place] Office: [phone] Pager:  [phone]   DG CHEST PORT 1 VIEW  Result Date: 01/22/2021 CLINICAL DATA:  Shortness of breath. EXAM: PORTABLE CHEST 1 VIEW COMPARISON:  January 15, 2021 FINDINGS: Stable cardiomegaly with stable AICD device. No pneumothorax. A new right PICC line terminates in the central SVC. No pulmonary nodules or masses. No focal infiltrates. No acute abnormalities. IMPRESSION: Support apparatus as above.  Cardiomegaly.  No acute abnormalities. Electronically Signed   By: Gerome Samavid  Williams III M.D   On: 01/22/2021 16:54   DG Foot Complete Right  Result Date: 02/01/2021 CLINICAL DATA:  Nonhealing wound of the medial foot EXAM: RIGHT FOOT COMPLETE - 3+ VIEW COMPARISON:  None. FINDINGS: Soft tissue swelling of the plantar medial forefoot. No plain radiographic evidence of osteomyelitis or septic arthritis. Mild degenerative arthritis of the MCP joint of the great toe. There is a metallic foreign object in the plantar soft tissues at the level of the proximal second and third toes. This looks like part of a needle. IMPRESSION: No evidence of osteomyelitis. Marked soft tissue swelling of the medial plantar forefoot. Small segment of radiopaque foreign object that looks like part of a needle. Electronically Signed   By: Paulina FusiMark  Shogry M.D.   On: 02/01/2021 10:05   VAS US ABI  WITH/WO TBI  Result Date: 01/24/2021  LOWER EXTREMITY DOPPLER STUDY Patient Name:  Luan PullingRICKEY Vinas  Date of Exam:   01/24/2021 Medical Rec #: [id number]     Accession #:    [id number] Date of Birth: 08-Nov-1960    Patient Gender: M Patient Age:   [age]Y [facility] Procedure:      VAS US ABI WITH/WO TBI Referring Phys: [id number] MARCUS V DUDA --------------------------------------------------------------------------------  Indications: Ulceration. High Risk Factors: Diabetes, coronary artery disease. Other Factors: Left BKA.  Comparison Study: No prior study on file Performing Technologist: Sherren KernsKanady, Candace RVS  Examination Guidelines: A complete evaluation includes at minimum, Doppler waveform signals and systolic blood pressure reading at the level of bilateral brachial, anterior tibial, and posterior tibial arteries, when vessel segments are accessible. Bilateral testing is considered an integral part of a complete examination. Photoelectric Plethysmograph (PPG) waveforms and toe systolic pressure readings are included as required and additional duplex testing as needed. Limited examinations for reoccurring indications may be performed as noted.  ABI Findings: +---------+------------------+-----+----------+----------+ Right    Rt Pressure (mmHg)IndexWaveform  Comment    +---------+------------------+-----+----------+----------+ Brachial                                  restricted +---------+------------------+-----+----------+----------+ PTA      107               1.06 monophasic           +---------+------------------+-----+----------+----------+ DP  88                0.87 monophasic           +---------+------------------+-----+----------+----------+ Great Toe41                0.41                      +---------+------------------+-----+----------+----------+ +---------+------------------+-----+---------+-------+ Left     Lt Pressure (mmHg)IndexWaveform  Comment +---------+------------------+-----+---------+-------+ Brachial 101                    triphasic        +---------+------------------+-----+---------+-------+ PTA                                      BKA     +---------+------------------+-----+---------+-------+ DP                                       BKA     +---------+------------------+-----+---------+-------+ Great Toe                                BKA     +---------+------------------+-----+---------+-------+ +-------+-----------+-----------+------------+------------+ ABI/TBIToday's ABIToday's TBIPrevious ABIPrevious TBI +-------+-----------+-----------+------------+------------+ Right  1.06       0.41                                +-------+-----------+-----------+------------+------------+ Left   BKA                                            +-------+-----------+-----------+------------+------------+  Summary: Right: Resting right ankle-brachial index is within normal range. No evidence of significant right lower extremity arterial disease. The right toe-brachial index is abnormal. ABIs are unreliable. Left: BKA.  *See table(s) above for measurements and observations.  Electronically signed by Fabienne Bruns MD on 01/24/2021 at 3:29:46 PM.    Final    ECHO TEE  Result Date: 01/20/2021    TRANSESOPHOGEAL ECHO REPORT   Patient Name:   [facility] Date of Exam: 01/20/2021 Medical Rec #:  [id number]    Height:       77.0 in Accession #:    [id number]   Weight:       255.4 lb Date of Birth:  1960/07/21   BSA:          2.482 m Patient Age:    59 years     BP:           95/57 mmHg Patient Gender: M            HR:           74 bpm. Exam Location:  Inpatient Procedure: Transesophageal Echo, Color Doppler and Cardiac Doppler Indications:     Bacteremia  History:         Patient has prior history of Echocardiogram examinations, most                  recent 01/16/2021. CHF, COPD; Risk Factors:Diabetes and  Hypertension.  Sonographer:     Eulah Pont RDCS Referring Phys:  [id number] Benay Spice [facility] Diagnosing Phys: Marca Ancona MD PROCEDURE: After discussion of the risks and benefits of a TEE, an informed consent was obtained from the patient. The transesophogeal probe was passed without difficulty through the esophogus of the patient. Sedation performed by different physician. The patient was monitored while under deep sedation. Anesthestetic sedation was provided intravenously by Anesthesiology: 202.65mg  of Propofol. The patient's vital signs; including heart rate, blood pressure, and oxygen saturation; remained stable throughout the procedure. The patient developed no complications during the procedure.  Marca Ancona MD Electronically signed by Signature Date/Time: /    Preliminary    [place] EKG SITE RITE  Result Date: 01/17/2021 If Site Rite image not attached, placement could not be confirmed due to current cardiac rhythm.  CT Super D Chest Wo Contrast  Result Date: 01/26/2021 CLINICAL DATA:  Shortness of breath. EXAM: CT CHEST WITHOUT CONTRAST TECHNIQUE: Multidetector CT imaging of the chest was performed using thin slice collimation for electromagnetic bronchoscopy planning purposes, without intravenous contrast. COMPARISON:  12/17/2020, 11/24/2015 and CT abdomen 08/10/2017. FINDINGS: Cardiovascular: Right PICC terminates in the right atrium. Atherosclerotic calcification of the aorta. Pulmonic trunk and heart are enlarged. No pericardial effusion. Mediastinum/Nodes: No pathologically enlarged mediastinal or axillary lymph nodes. Hilar regions are difficult to evaluate without IV contrast. Esophagus is grossly unremarkable. Lungs/Pleura: Image quality is degraded by respiratory motion and slight expiratory phase imaging. Smoothly marginated left lower lobe mass measures 3.2 x 3.3 cm, minimally larger than on remote prior exam 11/24/2015, 2.4 x 2.5 cm. Minimal subsegmental volume loss in the left  lower lobe. No pleural fluid. Airway is unremarkable. Upper Abdomen: Visualized portion of the liver is unremarkable. Bilateral adrenal nodules measure 2.3 cm on the right and 20 Hounsfield units and on the left, 1.7 cm and 33 Hounsfield units, similar to 08/10/2017. Visualized portions of the kidneys, spleen, pancreas, stomach and bowel are grossly unremarkable. Musculoskeletal: No worrisome lytic or sclerotic lesions. IMPRESSION: 1. No acute findings. 2. Left lower lobe mass with indolent growth over time. Findings favor a benign lesion such as a hamartoma. 3. Bilateral adrenal nodules, similar to 08/10/2017, indicative of lipid poor adenomas. 4.  Aortic atherosclerosis (ICD10-I70.0). 5. Enlarged pulmonic trunk, indicative of pulmonary arterial hypertension. Electronically Signed   By: Leanna Battles M.D.   On: 01/26/2021 08:05    Labs:  Basic Metabolic Panel: No results for input(s): NA, K, CL, CO2, GLUCOSE, BUN, CREATININE, CALCIUM, MG, PHOS in the last 168 hours.   CBC: No results for input(s): WBC, NEUTROABS, HGB, HCT, MCV, PLT in the last 168 hours.   CBG: Recent Labs  Lab 02/14/21 2053 02/15/21 0631 02/15/21 1126 02/15/21 1640 02/15/21 2106  GLUCAP 96 122* 112* 145* 146*   Family history.  Mother with hypertension as well as diabetes.  Father with hypertension and diabetes.  Denies any colon cancer esophageal cancer or rectal cancer  Brief HPI:   Richard [name] is a 60 y.o. 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  He was treated with IV diuresis as well as BiPAP.  He was started on broad-spectrum antibiotics with TEE for work-up per infectious disease.  TEE 7/28 showed ejection fraction of 15 to 20% with  severely dilated LV and diffuse  hypokinesis but was negative for vegetation, ASD, PFO and showed mild MR.  Left foot was suspected directed as source of infection and Dr. Lajoyce Corners was consulted for input.  Left foot CT showed swelling and soft tissue gas concerning for necrotizing infection a possibility of early osteomyelitis as well as nondisplaced calcaneal process fracture around lateral aspect.  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  Blood pressure stabilized, cardiac medications were resumed.  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  White count slowly trended down and ID recommended narrowing antibiotics to pen G with recommendations to continue for additional 2 days.  CT chest done due to issues of shortness of breath showed benign lesion as well is no acute findings.  PCCM consulted and reported that this was not a new finding as mass consistent with hematoma per prior biopsy and had been slowly growing.  They recommended having patient follow-up with weight for pulmonary after discharge.  He was admitted to inpatient rehab services 01/27/2021 for comprehensive therapies.  Vascular surgery consulted 01/31/2021 for abnormal ABIs was discharged to vascular surgery undergoing abdominal aortogram showing no significant aortoiliac or outflow disease/stenosis bilaterally.  There was noted occlusion of the right tibial peroneal trunk and proximal posterior tibial artery.  He underwent successful crossing of the tibioperoneal trunk posterior tibial artery occlusion and subsequent balloon angioplasty with no residual stenosis.  Patient with progressive ischemic changes right foot/abscess undergoing right first and second ray amputations application of wound VAC 02/04/2021 per Dr. Lajoyce Corners.  He remained on aspirin Plavix and prior to admission.  He had completed  antibiotic therapy.  Therapy evaluations completed due to patient decreased functional mobility was admitted for a comprehensive rehab program.   Hospital Course: Jaquavion Mccannon was admitted to rehab 02/07/2021 for inpatient therapies to consist of PT, ST and OT at least three hours five days a week. Past admission physiatrist, therapy team and rehab RN have worked together to provide customized collaborative inpatient rehab.  Pertaining to patient's left BKA 01/21/2021 as well as right first and second ray amputations due to abscess 02/04/2021.  Wound VAC and since been discontinued he would follow-up orthopedic services.  He remained on aspirin and Plavix as prior to admission.  Subtenons Lovenox for DVT prophylaxis no bleeding episodes.  Pain managed with use of Neurontin titrated as directed as well as oxycodone for breakthrough pain.  Mood stabilization with BuSpar as well as Cymbalta with emotional support provided.  Acute blood loss anemia latest hemoglobin stable at 10.8.  History of COPD oxygen saturations checked every shift oxygen dependent.  Chronic combined CHF monitoring for any signs of fluid overload Aldactone Demadex Entresto as advised and would follow-up outpatient.  Blood sugars overall controlled he continued on Jardiance as well as Semglee.  He did have a history of gout no gout flareups allopurinol as directed.  Protonix for GERD.  Patient with persistent right hand pain with some atrophy and curling with recommendations to follow-up outpatient hand surgery   Blood pressures were monitored on TID basis and soft and monitored  Diabetes has been monitored with ac/hs CBG checks and SSI was use prn for tighter BS control.    Rehab course: During patient's stay in rehab weekly team conferences were held to monitor patient's progress, set goals  and discuss barriers to discharge. At admission, patient required minimal assist lateral scoot transfer supervision sit to supine minimal assist sit to  stand.  Set up upper body bathing moderate assist lower body bathing set up upper body dressing  Physical exam.  Blood pressure 102/60 pulse 94 temperature 98.7 respiration 19 oxygen saturations 93% room air Constitutional.  No acute distress HEENT Head.  Normocephalic and atraumatic Eyes.  Pupils round and reactive to light no discharge.nystagmus Neck.  Supple nontender no JVD without thyromegaly Cardiac regular rate and rhythm any extra sounds or murmur heard Abdomen.  Soft nontender positive bowel sounds without rebound Respiratory effort normal no respiratory distress without wheeze Skin.  Right BKA dressed with stump shrinker right lower extremity dressed appropriately tender Neurologic.  Cranial nerves II through XII intact, motor strength 5/5 in bilateral deltoid bicep tricep, 4/5 grip, bilateral hip flexors, knee extensors, 3 -/5.  He/She  has had improvement in activity tolerance, balance, postural control as well as ability to compensate for deficits. He/She has had improvement in functional use RUE/LUE  and RLE/LLE as well as improvement in awareness.  Bed mobility patient performed supine to sit with supervision in a flat bed without use of bed rails and onto mat table.  Donned and doffed limb protector minimal assist.  Patient transfers lateral scoot bed to wheelchair wheelchair to mat table with supervision.  Supervision wheelchair mobility.  Worked on simulated lower body dressing using Thera-Band and lateral lean techniques.  Patient able to thread both lower extremities through band and pull up over waist with multiple repetitions.  Discussed functional implications of lateral leans during lower body dressing and toileting task.  Full family teaching completed plan discharged to home       Disposition: Discharged to home    Diet: Carb modified  Special Instructions: No driving smoking or alcohol  Stump care.  Antibacterial soap/Dial and water daily  Continue oxygen  therapy as prior to admission  Medications at discharge 1.  Tylenol as needed 2.  Zyloprim 100 mg p.o. daily 3.  Vitamin C 1000 mg p.o. daily 4.  Aspirin 81 mg p.o. daily 5.  Lipitor 40 mg p.o. daily 6.  Zebeta 2.5 mg p.o. daily 7.  BuSpar 5 mg p.o. twice daily 8.  Plavix any 5 mg p.o. daily 9.  Colace 100 mg p.o. daily 10.  Cymbalta 60 mg p.o. daily 11.  Jardiance 10 mg p.o. daily 12.  Neurontin 200 mg p.o. 3 times daily 13.  Oxycodone 5 to 10 mg every 4 hours as needed pain 14.  Semglee 13 units twice daily 15.  Imdur 30 mg p.o. daily 16.  Melatonin 3 mg p.o. nightly 17.  Multivitamin daily 18.  Protonix 40 mg twice daily 19.  Entresto 24-26 mg 1 tablet twice daily 20.  Aldactone 12.5 mg p.o. daily 21.  Demadex 40 mg p.o. daily 22.  Zinc sulfate 220 mg p.o. daily  30-35 minutes were spent completing discharge summary and discharge planning Discharge Instructions     Ambulatory referral to Physical Medicine Rehab   Complete by: As directed    Moderate complexity follow-up 1 to 2 weeks left BKA as well as history of right first and second ray amputation        Follow-up Information     Lovorn, Aundra Millet, MD Follow up.   Specialty: Physical Medicine and Rehabilitation Why: Office to call for appointment Contact information: [address] [place] [address] [phone]  Nadara Mustard, MD Follow up.   Specialty: Orthopedic Surgery Why: Call for appointment Contact information: [address] [place] [address] [phone]         Nada Libman, MD Follow up.   Specialties: Vascular Surgery, Cardiology Why: Call for appointment Contact information: [address] [place] [address] [phone]         Betha Loa, MD Follow up.   Specialty: Orthopedic Surgery Why: call for appointment for right hand pain/atrophy/curling Contact information: [address] [place] [address] [phone]                  Signed: Mcarthur Rossetti Rodric Punch 02/16/2021, 5:40 AM

## 2021-02-15 LAB — GLUCOSE, CAPILLARY
Glucose-Capillary: 122 mg/dL — ABNORMAL HIGH (ref 70–99)
Glucose-Capillary: 112 mg/dL — ABNORMAL HIGH (ref 70–99)
Glucose-Capillary: 145 mg/dL — ABNORMAL HIGH (ref 70–99)
Glucose-Capillary: 146 mg/dL — ABNORMAL HIGH (ref 70–99)

## 2021-02-15 MED ORDER — DOCUSATE SODIUM 100 MG PO CAPS: 100.0000 mg | ORAL_CAPSULE | Freq: Every day | ORAL | 0 refills | Status: DC

## 2021-02-15 MED ORDER — BREO ELLIPTA 100-25 MCG/INH IN AEPB: 1.0000 | INHALATION_SPRAY | Freq: Every day | RESPIRATORY_TRACT | 1 refills | Status: DC

## 2021-02-15 MED ORDER — MAGNESIUM-OXIDE 400 (241.3 MG) MG PO TABS
400.0000 mg | ORAL_TABLET | Freq: Every day | ORAL | 11 refills | Status: DC
Start: 2021-02-15 — End: 2021-02-15

## 2021-02-15 MED ORDER — MELATONIN 3 MG PO TABS: 3.0000 mg | ORAL_TABLET | Freq: Every day | ORAL | 0 refills | Status: DC

## 2021-02-15 MED ORDER — BUSPIRONE HCL 5 MG PO TABS: 5.0000 mg | ORAL_TABLET | Freq: Two times a day (BID) | ORAL | 0 refills | Status: DC

## 2021-02-15 MED ORDER — ASCORBIC ACID 1000 MG PO TABS: 1000.0000 mg | ORAL_TABLET | Freq: Every day | ORAL | 0 refills | Status: DC

## 2021-02-15 MED ORDER — CLOPIDOGREL BISULFATE 75 MG PO TABS: 75.0000 mg | ORAL_TABLET | Freq: Every day | ORAL | 2 refills | Status: DC

## 2021-02-15 MED ORDER — ZINC SULFATE 220 (50 ZN) MG PO CAPS: 220.0000 mg | ORAL_CAPSULE | Freq: Every day | ORAL | 0 refills | Status: DC

## 2021-02-15 MED ORDER — DULOXETINE HCL 60 MG PO CPEP: 60.0000 mg | ORAL_CAPSULE | Freq: Every day | ORAL | 2 refills | Status: AC

## 2021-02-15 MED ORDER — MAGNESIUM-OXIDE 400 (241.3 MG) MG PO TABS: 400.0000 mg | ORAL_TABLET | Freq: Every day | ORAL | 11 refills | Status: DC

## 2021-02-15 MED ORDER — BISOPROLOL FUMARATE 5 MG PO TABS: 2.5000 mg | ORAL_TABLET | Freq: Every day | ORAL | 0 refills | Status: DC

## 2021-02-15 MED ORDER — GABAPENTIN 100 MG PO CAPS
200.0000 mg | ORAL_CAPSULE | Freq: Three times a day (TID) | ORAL | 0 refills | Status: DC
Start: 2021-02-15 — End: 2021-03-08

## 2021-02-15 MED ORDER — ISOSORBIDE MONONITRATE ER 30 MG PO TB24: 30.0000 mg | ORAL_TABLET | Freq: Every day | ORAL | 0 refills | Status: DC

## 2021-02-15 MED ORDER — TORSEMIDE 40 MG PO TABS: 40.0000 mg | ORAL_TABLET | Freq: Every day | ORAL | 0 refills | Status: DC

## 2021-02-15 MED ORDER — HYDROCODONE-ACETAMINOPHEN 5-325 MG PO TABS: 1.0000 | ORAL_TABLET | Freq: Four times a day (QID) | ORAL | 0 refills | Status: DC | PRN

## 2021-02-15 MED ORDER — ATORVASTATIN CALCIUM 40 MG PO TABS: ORAL_TABLET | ORAL | 0 refills | Status: DC

## 2021-02-15 MED ORDER — ALBUTEROL SULFATE HFA 108 (90 BASE) MCG/ACT IN AERS: 1.0000 | INHALATION_SPRAY | RESPIRATORY_TRACT | 1 refills | Status: AC | PRN

## 2021-02-15 MED ORDER — INSULIN GLARGINE-YFGN 100 UNIT/ML ~~LOC~~ SOLN: 13.0000 [IU] | Freq: Two times a day (BID) | SUBCUTANEOUS | 11 refills | Status: DC

## 2021-02-15 MED ORDER — SPIRONOLACTONE 25 MG PO TABS: 12.5000 mg | ORAL_TABLET | Freq: Every day | ORAL | 0 refills | Status: DC

## 2021-02-15 MED ORDER — DICLOFENAC SODIUM 1 % EX GEL: 2.0000 g | Freq: Four times a day (QID) | CUTANEOUS | 0 refills | Status: DC

## 2021-02-15 MED ORDER — SACUBITRIL-VALSARTAN 24-26 MG PO TABS
1.0000 | ORAL_TABLET | Freq: Two times a day (BID) | ORAL | 0 refills | Status: DC
Start: 2021-02-15 — End: 2021-03-24

## 2021-02-15 MED ORDER — EMPAGLIFLOZIN 10 MG PO TABS: 10.0000 mg | ORAL_TABLET | Freq: Every day | ORAL | 0 refills | Status: DC

## 2021-02-15 MED ORDER — ALLOPURINOL 100 MG PO TABS: 100.0000 mg | ORAL_TABLET | Freq: Every day | ORAL | 3 refills | Status: AC

## 2021-02-15 MED ORDER — FENOFIBRATE 145 MG PO TABS: 145.0000 mg | ORAL_TABLET | Freq: Every day | ORAL | 0 refills | Status: DC

## 2021-02-15 NOTE — Progress Notes (Signed)
Physical Therapy Discharge Summary  Patient Details  Name: Richard [name] MRN: [id number] Date of Birth: March 06, 1961     Patient has met 5 of 5 long term goals due to improved activity tolerance, improved postural control, increased strength, and ability to compensate for deficits.  Patient to discharge at a wheelchair level Modified Independent.   [name] care partner is independent to provide the necessary physical assistance at discharge. Pt d/c home with his wife. Supervision transfers, only occ cueing required to maintain WB precautions. Mod I w/c mobility including mgmt of leg rests and breaks. W/c eval performed and loaner chair provided by stalls. No family education performed d/t availability. Pt given hand outs on bumping up stairs since ramp was not built before d/c. Pt expressed understanding.   Reasons goals not met: NA  Recommendation:  Patient will benefit from ongoing skilled PT services in home health setting to continue to advance safe functional mobility, address ongoing impairments in strength, functional mobility, ROM, and minimize fall risk.  Equipment: W/c provided through [facility]  Reasons for discharge: treatment goals met and discharge from hospital  Patient/family agrees with progress made and goals achieved: Yes  PT Discharge Precautions/Restrictions Precautions Precautions: Fall Precaution Comments: Recent 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); R foot TDWB if needed through heel transfers Required Braces or Orthoses: Other Brace Other Brace: L BKA limb guard, R foot post-op shoe Restrictions Weight Bearing Restrictions: Yes RLE Weight Bearing: Touchdown weight bearing LLE Weight Bearing: Non weight bearing Other Position/Activity Restrictions: Per MD note for R foot: "Weightbearing: Ideally nonweightbearing on the right but touchdown weightbearing on the heel would be acceptable". Vital Signs Therapy Vitals Temp: 98.5 F (36.9 C) Temp Source: Oral Pulse Rate: 76 Resp:  18 BP: (!) 99/51 Patient Position (if appropriate): Lying Oxygen Therapy SpO2: 97 % O2 Device: Room Air Pain Pain Assessment Pain Scale: 0-10 Pain Score: 4  Pain Interference Pain Interference Pain Effect on Sleep: 1. Rarely or not at all Pain Interference with Therapy Activities: 1. Rarely or not at all Pain Interference with Day-to-Day Activities: 1. Rarely or not at all Vision/Perception  Vision - History Ability to See in Adequate Light: 0 Adequate Vision - Assessment Additional Comments: pt reports visual deficits that have been ongoing d/t DM, no glasses at this time Perception Perception: Within Functional Limits Praxis Praxis: Intact  Cognition Overall Cognitive Status: Within Functional Limits for tasks assessed Arousal/Alertness: Awake/alert Attention: Focused;Sustained Focused Attention: Appears intact Sustained Attention: Appears intact Memory: Impaired Memory Impairment: Decreased recall of new information Awareness: Appears intact Problem Solving: Appears intact Safety/Judgment: Appears intact Sensation Sensation Light Touch: Impaired by gross assessment Light Touch Impaired Details: Impaired RLE Hot/Cold: Impaired by gross assessment Proprioception: Impaired by gross assessment Coordination Gross Motor Movements are Fluid and Coordinated: No Fine Motor Movements are Fluid and Coordinated: Yes Coordination and Movement Description: Affected by altered center of mass due to amputations and sensation deficits, FMC limited d/t neuropathy Motor  Motor Motor: Other (comment) Motor - Skilled Clinical Observations: grossly limited d/t amputations and altered center of mass, motor limited d/t neuropathy  Mobility Bed Mobility Bed Mobility: Sit to Supine Rolling Right: Supervision/verbal cueing Right Sidelying to Sit: Supervision/Verbal cueing Supine to Sit: Supervision/Verbal cueing Sitting - Scoot to Edge of Bed: Supervision/Verbal cueing Sit to Supine:  Supervision/Verbal cueing Transfers Transfers: Squat Pivot Transfers;Lateral/Scoot Transfers Sit to Stand: Minimal Assistance - Patient > 75% Stand to Sit: Minimal Assistance - Patient > 75% Squat Pivot Transfers: Independent with assistive device Lateral/Scoot Transfers: Independent with  assistive device Transfer (Assistive device): Rolling walker Locomotion  Gait Ambulation: No Gait Distance (Feet): 0 Feet Gait Gait: No Stairs / Additional Locomotion Stairs: No Ramp: Independent with assistive device Pick up small object from the floor (from standing position) activity did not occur: Safety/medical concerns Product manager Mobility: Yes Wheelchair Assistance: Set up Lexicographer: Both upper extremities Wheelchair Parts Management: Needs assistance (needs assist 2/2 neuropathy) Distance: 200 ft  Trunk/Postural Assessment  Cervical Assessment Cervical Assessment: Within Functional Limits Thoracic Assessment Thoracic Assessment: Within Functional Limits Lumbar Assessment Lumbar Assessment: Within Functional Limits Postural Control Postural Control: Within Functional Limits  Balance Balance Balance Assessed: Yes Static Sitting Balance Static Sitting - Balance Support: Feet supported Static Sitting - Level of Assistance: 7: Independent Dynamic Sitting Balance Dynamic Sitting - Balance Support: No upper extremity supported Dynamic Sitting - Level of Assistance: 6: Modified independent (Device/Increase time) Dynamic Sitting - Balance Activities: Lateral lean/weight shifting;Forward lean/weight shifting;Reaching for objects Sitting balance - Comments: Required assist to don R foot post op shoe Dynamic Standing Balance Dynamic Standing - Balance Support: Bilateral upper extremity supported Dynamic Standing - Level of Assistance: 4: Min assist Extremity Assessment  RUE Assessment RUE Assessment: Exceptions to [facility] General Strength Comments:  limited by gout/swelling in MCPs LUE Assessment LUE Assessment: Within Functional Limits RLE Assessment RLE Assessment: Exceptions to [facility] General Strength Comments: 4+/5 overall      Mickel Fuchs 02/15/2021, 4:35 PM

## 2021-02-15 NOTE — Patient Care Conference (Signed)
Inpatient RehabilitationTeam Conference and Plan of Care Update Date: 02/15/2021   Time: 11:34 AM    Patient Name: Richard [name]      Medical Record Number: [id number]  Date of Birth: 09-11-1960 Sex: Male         Room/Bed: 4W18C/4W18C-01 Payor Info: Payor: MEDICARE / Plan: MEDICARE PART A AND B / Product Type: *No Product type* /    Admit Date/Time:  02/07/2021  2:32 PM  Primary Diagnosis:  S/P BKA (below knee amputation), left [facility])  Hospital Problems: Principal Problem:   S/P BKA (below knee amputation), left (HCC) Active Problems:   Left below-knee amputee (HCC)   Sleep disturbance   Essential hypertension   Diabetic peripheral neuropathy (HCC)   Labile blood glucose    Expected Discharge Date: Expected Discharge Date: 02/16/21  Team Members Present: Physician leading conference: Dr. Genice Rouge Social Worker Present: Dossie Der, LCSW Nurse Present: Kennyth Arnold, RN PT Present: Bernie Covey, PT OT Present: Earleen Newport, OT PPS Coordinator present : Edson Snowball, PT     Current Status/Progress Goal Weekly Team Focus  Bowel/Bladder   (P) Continent x2. LBM 02/14/2021  (P) remain continent  (P) Assess Qshift and PRN   Swallow/Nutrition/ Hydration             ADL's   Supervision squat pivot bed, chair, BSC, Min A LB bathe/dress, lateral leans on commode/EOB, cues to not stand and WB precautions, limited by neuropathy  Supervision B&D, Mod I toilet  squat pivot transfers, donning/doffing guard and shoe, AE PRN for ADLs, ADL retraining, sink level bathing, DM/limb loss education   Mobility   supervision scoot transfers, mod I w/c, w/c eval completed 8/22, limited by WB precautions  supervision gait/standing transfers, mod I scoot transfers  car transfers functional mobility   Communication             Safety/Cognition/ Behavioral Observations            Pain   (P) No c/o pain  (P) Pain <3/10  (P) Assess pain qshift and PRN   Skin   (P) Left BKA. Right toe  amputation.  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 Voltaren gel helps some. Continent B/B, BKA has staples, dressing changes to right ray amputation. Supervision for squat pivot transfers, min assist lower body dressing. Does lateral leans well. Mod I with WC. WC eval yesterday. Limited by weight bearing precautions. Ramp has not been built yet.  Patient on target to meet rehab goals: yes  *See Care Plan and progress notes for long and short-term goals.   Revisions to Treatment Plan:  Added Voltaren gel for hand pain.  Teaching Needs: Family education, medication management, pain management, skin/wound care, transfer training, gait training, balance training, endurance training, weight bearing precautions, safety awareness.  Current Barriers to Discharge: Decreased caregiver support, Medical stability, Home enviroment access/layout, Wound care, Lack of/limited family support, Weight, Weight bearing restrictions, and Medication compliance  Possible Resolutions to Barriers: Continue current medications, provide emotional support.     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 in R hand; staples in L BKA and R ray amputation- staples  Barriers to Discharge: Decreased family/caregiver support;Home enviroment access/layout;Weight bearing restrictions;Wound care  Barriers to Discharge Comments: s/u for H/H-  equipment being delivered Possible Resolutions to [employer]: focus- d/c tomorrow- rest of family training; send to hand surgeon after d/c- Dr Merlyn Lot; voltaren gel for R hand pain; d/c tomorrow 02/16/21- w/c eval- [facility]-   Continued Need for Acute Rehabilitation Level of Care: The patient requires daily medical management by a physician  with specialized training in physical medicine and rehabilitation for the following reasons: Direction of a multidisciplinary physical rehabilitation program to maximize functional independence : Yes Medical management of patient stability for increased activity during participation in an intensive rehabilitation regime.: Yes Analysis of laboratory values and/or radiology reports with any subsequent need for medication adjustment and/or medical intervention. : Yes   I attest that I was present, lead the team conference, and concur with the assessment and plan of the team.   Tennis Must 02/15/2021, 4:36 PM

## 2021-02-15 NOTE — Progress Notes (Signed)
Occupational Therapy Discharge Summary  Patient Details  Name: Richard [name] MRN: [id number] Date of Birth: Dec 25, 1960    Patient has met 8 of 8 long term goals due to improved activity tolerance, improved balance, postural control, ability to compensate for deficits, improved awareness, and improved coordination.  Patient to discharge at overall Mod I - Supervision level.  [name] care partner is independent to provide the necessary physical assistance at discharge.  Pt is Mod I with squat pivot transfers to/from bed <> chair and BSC, but does need Supervision for bathing tasks d/t difficulty reaching buttocks at times, handing pt items needed, and for general safety. Planning to bathe sink level with wife/SO present. Declined teaching, saying he can relay to wife what to do. Pt is limited d/t neuropathy in B hands and general lack of coordination from L BKA and R toe amputations. Modifications made to wheelchair for more tactile and visual feedback. Pt has also been limited by nausea during his hospitalization and medically managed.  Reasons goals not met: NA  Recommendation:  Patient will benefit from ongoing skilled OT services in home health setting to continue to advance functional skills in the area of BADL and Reduce care partner burden.  Equipment: DABSC  Reasons for discharge: treatment goals met and discharge from hospital  Patient/family agrees with progress made and goals achieved: Yes  OT Discharge Precautions/Restrictions  Precautions Precautions: Fall Precaution Comments: Recent 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); R foot TDWB if needed through heel transfers Required Braces or Orthoses: Other Brace Other Brace: L BKA limb guard, R foot post-op shoe Restrictions Weight Bearing Restrictions: Yes RLE Weight Bearing: Touchdown weight bearing LLE Weight Bearing: Non weight bearing Other Position/Activity Restrictions: Per MD note for R foot: "Weightbearing: Ideally nonweightbearing on the  right but touchdown weightbearing on the heel would be acceptable". Pain Pain Assessment Pain Scale: 0-10 Pain Score: 4  ADL ADL Eating: Not assessed Grooming: Modified independent Upper Body Bathing: Setup Lower Body Bathing: Supervision/safety Where Assessed-Lower Body Bathing: Sitting at sink Upper Body Dressing: Setup Lower Body Dressing: Supervision/safety Toileting: Modified independent (simulated) Toilet Transfer: Modified independent Toilet Transfer Method: Engineer, water: Drop arm bedside commode Vision Baseline Vision/History:  (reports visual deficits 2/2 DM) Patient Visual Report: No change from baseline Vision Assessment?: No apparent visual deficits Additional Comments: pt reports visual deficits that have been ongoing d/t DM, no glasses at this time Perception  Perception: Within Functional Limits Praxis Praxis: Intact Cognition Overall Cognitive Status: Within Functional Limits for tasks assessed Arousal/Alertness: Awake/alert Attention: Focused;Sustained Focused Attention: Appears intact Sustained Attention: Appears intact Memory: Impaired Memory Impairment: Decreased recall of new information Awareness: Appears intact Problem Solving: Appears intact Safety/Judgment: Appears intact Sensation Sensation Light Touch: Impaired by gross assessment Coordination Gross Motor Movements are Fluid and Coordinated: No Fine Motor Movements are Fluid and Coordinated: No Coordination and Movement Description: Affected by altered center of mass due to amputations and sensation deficits, FMC limited d/t neuropathy Motor  Motor Motor: Other (comment) (limited d/t amputations) Motor - Skilled Clinical Observations: grossly limited d/t amputations and altered center of mass, motor limited d/t neuropathy Trunk/Postural Assessment  Cervical Assessment Cervical Assessment: Within Functional Limits Thoracic Assessment Thoracic Assessment: Within  Functional Limits Lumbar Assessment Lumbar Assessment: Within Functional Limits Postural Control Postural Control: Within Functional Limits  Balance Balance Balance Assessed: Yes Static Sitting Balance Static Sitting - Balance Support: Feet supported (RLE only with surgical shoe) Static Sitting - Level of Assistance: 7: Independent Dynamic Sitting Balance Dynamic Sitting -  Balance Support: No upper extremity supported Dynamic Sitting - Level of Assistance: 6: Modified independent (Device/Increase time) (RLE only with surgical shoe) Dynamic Sitting - Balance Activities: Lateral lean/weight shifting;Forward lean/weight shifting;Reaching for objects Extremity/Trunk Assessment RUE Assessment RUE Assessment: Exceptions to [facility] General Strength Comments: limited by gout/swelling in MCPs LUE Assessment LUE Assessment: Within Functional Limits   Viona Gilmore 02/15/2021, 12:51 PM

## 2021-02-15 NOTE — Progress Notes (Signed)
Patient ID: Richard [name], male   DOB: 09-11-1960, 60 y.o.   MRN: [id number] Met with pt aware of team conference and meeting goals and discharge tomorrow. He feels prepared and ready to go home. Equipment in room and follow up set up.

## 2021-02-15 NOTE — Progress Notes (Signed)
Occupational Therapy Session Note  Patient Details  Name: Richard [name] MRN: [id number] Date of Birth: [birth year]  Today's Date: 02/15/2021 OT Individual Time: 2707-8675 and 1450-1605 OT Individual Time Calculation (min): 69 min and 75 min   Short Term Goals: Week 1:  OT Short Term Goal 1 (Week 1): STGs = LTGs d/t ELOS   Skilled Therapeutic Interventions/Progress Updates:    Session 1: Pt greeted at time of session, semireclined and sidelying agreeable to OT session and wanting to do ADL. Pt stating he feels better than yesterday. No pain reported. Sitting EOB Mod I, squat pivot bed > wheelchair set up but would be Mod I if wheelchair left by bed surface. Pt remembering to lock brakes and remove arm rest, self propel > sink Mod I and performed UB/LB bathing sitting at sink with lateral leans to remove LB clothing and to reach buttocks with Set up/Supervision . Note discussed with pt plan to bathe at kitchen sink from wheelchair level, and pt stating he will have male family members to assist getting in and out of bathroom, educated again that since w/c cannot fit through bathroom door, do not recommend having family members help carry him. Pt verbalizing understanding but unclear carry over. Seated oral hygiene and grooming tasks with Mod I from w/c. Squat pivot back to bed prior to UB dress set up, LB dress Supervision/Set up with lateral leans. Focused as well on donning/doffing shrinker for LLE as well for skin inspection and pt unable to fully don/doff d/t neuropathy an therapist assisting. Pt left up in chair alarm on call bell in reach.   Session 2: Pt greeted at time of session semireclined in bed resting but easily woken with verbal stimuli. No pain reported. Focus of session on squat pivot transfers to/from bed <> wheelchair <> drop arm BSC with focus on on form, removing armrests/BSC arms, and managing wheelchair parts on new lightweight chair. Pt able to transfer to/from w/c <> BSC Mod I.  Self propel room <> ADL apartment Mod I as well extended time d/t R hand gouty pain. In kitchen, focused on meal prep simulation gathering 10/10 items from fridge wheelchair level, bringing over to table top, and simulating meal prep making a sandwich. Needed Supervision for new environment only otherwise Mod I. Back in room, therapist applying brightly colored and textured coban to wheelchair parts/handles for easier location and grip. Squat pivot wheelchair > bed Mod I, call bell in reach all needs met.   Therapy Documentation Precautions:  Precautions Precautions: Fall Precaution Comments: Recent 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); R foot TDWB if needed through heel transfers Required Braces or Orthoses: Other Brace Other Brace: L BKA limb guard, R foot post-op shoe Restrictions Weight Bearing Restrictions: Yes RLE Weight Bearing: Touchdown weight bearing LLE Weight Bearing: Non weight bearing Other Position/Activity Restrictions: Per MD note for R foot: "Weightbearing: Ideally nonweightbearing on the right but touchdown weightbearing on the heel would be acceptable".     Therapy/Group: Individual Therapy  Viona Gilmore 02/15/2021, 7:16 AM

## 2021-02-15 NOTE — Progress Notes (Addendum)
PROGRESS NOTE   Subjective/Complaints:   Said pain is 7/10 in R palm of hand-is somewhat new, but admits having hand changes "for awhile".   Rates pain 7/10 - but voltaren gel brings it down to 5/10.   Feels like most of his pain is his R hand, not his legs.   ROS:   Pt denies SOB, abd pain, CP, N/V/C/D, and vision changes    Objective:   No results found. No results for input(s): WBC, HGB, HCT, PLT in the last 72 hours.  No results for input(s): NA, K, CL, CO2, GLUCOSE, BUN, CREATININE, CALCIUM in the last 72 hours.   Intake/Output Summary (Last 24 hours) at 02/15/2021 0908 Last data filed at 02/15/2021 0700 Gross per 24 hour  Intake 1200 ml  Output 1000 ml  Net 200 ml        Physical Exam: Vital Signs Blood pressure (!) 95/57, pulse 69, temperature 97.8 F (36.6 C), temperature source Oral, resp. rate 20, height 6\' 5"  (1.956 m), weight 102.9 kg, SpO2 92 %.    General: awake, alert, appropriate, NAD HENT: conjugate gaze; oropharynx moist CV: regular rate; no JVD Pulmonary: CTA B/L; no W/R/R- good air movement GI: soft, NT, ND, (+)BS Psychiatric: appropriate Neurological: Ox3   Skin: Warm and dry.  Surgical sites with dressing Kerlex and ACE wrap Right foot   Musc: Left lower extremity BKA wearing limb guard and ACE wrap on R foot Has ? Dupuytren's forming contracture of R hand- also has atrophy of R 1st dorsal interossei as well as all other intrinsics of R hand on dorsal aspect of R hand- fingers also curling compared to L hand.   Ray amputation right foot Neuro: Alert Motor: 5/5 in bilateral deltoid, bicep, tricep, hand grip 4-/5  hip flexor,RIgh t knee extensors    Assessment/Plan: 1. Functional deficits which require 3+ hours per day of interdisciplinary therapy in a comprehensive inpatient rehab setting. Physiatrist is providing close team supervision and 24 hour management of active medical  problems listed below. Physiatrist and rehab team continue to assess barriers to discharge/monitor patient progress toward functional and medical goals  Care Tool:  Bathing    Body parts bathed by patient: Right arm, Left arm, Chest, Abdomen, Front perineal area, Buttocks, Right upper leg, Face, Left upper leg   Body parts bathed by helper: Right lower leg Body parts n/a: Left lower leg   Bathing assist Assist Level: Minimal Assistance - Patient > 75%     Upper Body Dressing/Undressing Upper body dressing   What is the patient wearing?: Pull over shirt    Upper body assist Assist Level: Set up assist    Lower Body Dressing/Undressing Lower body dressing      What is the patient wearing?: Pants     Lower body assist Assist for lower body dressing: Moderate Assistance - Patient 50 - 74%     Toileting Toileting    Toileting assist Assist for toileting: Independent with assistive device Assistive Device Comment: urinal   Transfers Chair/bed transfer  Transfers assist  Chair/bed transfer activity did not occur: Safety/medical concerns  Chair/bed transfer assist level: Supervision/Verbal cueing     Locomotion  Ambulation   Ambulation assist   Ambulation activity did not occur: Safety/medical concerns          Walk 10 feet activity   Assist  Walk 10 feet activity did not occur: Safety/medical concerns        Walk 50 feet activity   Assist Walk 50 feet with 2 turns activity did not occur: Safety/medical concerns         Walk 150 feet activity   Assist Walk 150 feet activity did not occur: Safety/medical concerns         Walk 10 feet on uneven surface  activity   Assist Walk 10 feet on uneven surfaces activity did not occur: Safety/medical concerns         Wheelchair     Assist Is the patient using a wheelchair?: Yes Type of Wheelchair: Manual Wheelchair activity did not occur: Safety/medical concerns  Wheelchair assist  level: Supervision/Verbal cueing Max wheelchair distance: 55 ft    Wheelchair 50 feet with 2 turns activity    Assist    Wheelchair 50 feet with 2 turns activity did not occur: Safety/medical concerns   Assist Level: Supervision/Verbal cueing   Wheelchair 150 feet activity     Assist  Wheelchair 150 feet activity did not occur: Safety/medical concerns   Assist Level: Supervision/Verbal cueing   Blood pressure (!) 95/57, pulse 69, temperature 97.8 F (36.6 C), temperature source Oral, resp. rate 20, height 6\' 5"  (1.956 m), weight 102.9 kg, SpO2 92 %.  Medical Problem List and Plan: 1.  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  Wound VAC as directed, checked with orthopedic surgery, generally used from 1 week however given the continued drainage will continue this until early next week.  Con't PT and OT- d/c tomorrow 2.  Antithrombotics: -DVT/anticoagulation: SCD right lower extremity Pharmaceutical: Lovenox             -antiplatelet therapy: Aspirin 81 mg daily and Plavix 75 mg daily 3. Pain Management: Neurontin 100 mg 3 times daily will increase to 200mg  TID , oxycodone as needed  K pad ordered on 8/17  8/23- added voltaren gel 4x/day for R hand- and will send referral for Dr 9/17 hand surgeon after d/c for R hand pain/atrophy/curling.  4. Mood: BuSpar 5 mg twice daily, Cymbalta 60 mg daily             -antipsychotic agents: N/A 5. Neuropsych: This patient is capable of making decisions on his own behalf. 6. Skin/Wound Care: Routine skin checks,  7. Fluids/Electrolytes/Nutrition: Routine in and outs 8.  Acute on chronic anemia.    Hemoglobin 10.8 on 8/16  Continue to monitor 9.  Chronic combined CHF.  Monitor for any signs of fluid overload.  Aldactone 12.5 mg daily, Demadex 40 mg daily, Entresto 24-26 mg twice daily Filed Weights   02/14/21 0500  Weight: 102.9 kg   10.  COPD.  Check oxygen  saturations every shift 11.  Diabetes mellitus or peripheral neuropathy.  Jardiance 10 mg daily, NovoLog 4 units 3 times daily, Semglee 13 units twice daily CBG (last 3)  Recent Labs    02/14/21 1653 02/14/21 2053 02/15/21 0631  GLUCAP 170* 96 122*   8/23- BG's controlled 12.  History of gout.  Allopurinol 100 mg daily 13.  Hyperlipidemia.  Lipitor 14.  Hypertension.  Zebeta 2.5 mg daily, Imdur 30 mg daily, Entresto, torsemide  Vitals:   02/14/21 1925  02/15/21 0451  BP: 101/67 (!) 95/57  Pulse: 69 69  Resp: 18 20  Temp: 97.9 F (36.6 C) 97.8 F (36.6 C)  SpO2: 95% 92%   Controlled 8/21 15.  GERD.  Protonix 16.  Sleep disturbance  Melatonin started    W/C justification:  Pt needs a specialty w/c due to his L BKA, and new ray amputation on RLE- and very high likelihood of getting R BKA/AKA in the near future.   He also has severe neuropathy and cannot feel or grip well with forming Dupuytren's contracture- on R hand and severe atrophy/neuropathy in hands- causing difficulty to grip, so needs help with light weight w/c to push it and rubber coating on push rims to help him push- also standard w/c won't fit him due to his height of 6 ft 5 inches.    LOS: 8 days A FACE TO FACE EVALUATION WAS PERFORMED  Sabina Beavers 02/15/2021, 9:08 AM

## 2021-02-15 NOTE — Progress Notes (Signed)
Physical Therapy Session Note  Patient Details  Name: Richard [name] MRN: [id number] Date of Birth: 08/13/1960  Today's Date: 02/15/2021 PT Individual Time: 1300-1400, 1300-1400 PT Individual Time Calculation (min): 60 min, 60 min  Short Term Goals: Week 1:  PT Short Term Goal 1 (Week 1): =LTGs d/t ELOS  Skilled Therapeutic Interventions/Progress Updates:    Session 1: Pt received in w/c with limb guard and surgical shoe donned. Pt propelled w/c with BUE up to 200 ft with breaks at times. Car transfer with supervision for lateral scoot. Pt inquired if he would be able to drive, educated that MD would clear him to drive if it is safe to do so.  Pt then navigated ramp with w/c, reporting it was easier than previous attempts and he feels he is getting stronger. Pt reported that they have not been able to get ramp built, pt was given handout with instructions on bumping up steps in w/c. Pt was left with all needs in reach and alarm active.   Session 2: Pt seated in w/c on arrival and agreeable to therapy. Pt initially emotional on arrival, stated "I don't know why this happened to me ... I didn't do anything wrong." Pt was able to quickly self soothe and expressed desire to start session.   Pt propelled w/c with BUE to therapy gym. Session focused on familiarizing pt with loaner w/c and establishing independence with managing w/c parts. Scoot pivot mat<>w/c<> bed with supervision throughout session. Pt demoed improved efficiency with LW chair, 7 strokes/30 ft vs 11 strokes/30 ft. Strength and sensation testing performed as documented below.   Pt propelled chair up to 200 ft before needing to rest, navigated outdoor environments and was able to don/doff leg rests with supervision. Pt returned to room and returned to bed, was left with all needs in reach and alarm active.   Therapy Documentation Precautions:  Precautions Precautions: Fall Precaution Comments: Recent 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); R foot TDWB if  needed through heel transfers Required Braces or Orthoses: Other Brace Other Brace: L BKA limb guard, R foot post-op shoe Restrictions Weight Bearing Restrictions: Yes RLE Weight Bearing: Touchdown weight bearing LLE Weight Bearing: Non weight bearing Other Position/Activity Restrictions: Per MD note for R foot: "Weightbearing: Ideally nonweightbearing on the right but touchdown weightbearing on the heel would be acceptable". General:   Vital Signs: Therapy Vitals Temp: 98.5 F (36.9 C) Temp Source: Oral Pulse Rate: 76 Resp: 18 BP: (!) 99/51 Patient Position (if appropriate): Lying Oxygen Therapy SpO2: 97 % O2 Device: Room Air Pain: Pain Assessment Pain Scale: 0-10 Pain Score: 4  Mobility: Bed Mobility Bed Mobility: Sit to Supine Rolling Right: Supervision/verbal cueing Right Sidelying to Sit: Supervision/Verbal cueing Supine to Sit: Supervision/Verbal cueing Sitting - Scoot to Edge of Bed: Supervision/Verbal cueing Sit to Supine: Supervision/Verbal cueing Transfers Transfers: Systems analyst;Lateral/Scoot Transfers Sit to Stand: Minimal Assistance - Patient > 75% Stand to Sit: Minimal Assistance - Patient > 75% Squat Pivot Transfers: Independent with assistive device Lateral/Scoot Transfers: Independent with assistive device Transfer (Assistive device): Rolling walker Locomotion : Gait Ambulation: No Gait Distance (Feet): 0 Feet Gait Gait: No Stairs / Additional Locomotion Stairs: No Ramp: Independent with assistive Paediatric nurse Mobility: Yes Wheelchair Assistance: Set up Education officer, museum: Both upper extremities Wheelchair Parts Management: Needs assistance (needs assist 2/2 neuropathy) Distance: 200 ft  Trunk/Postural Assessment : Cervical Assessment Cervical Assessment: Within Functional Limits Thoracic Assessment Thoracic Assessment: Within Functional Limits Lumbar Assessment Lumbar Assessment: Within  Functional Limits Postural Control Postural Control: Within Functional Limits  Balance: Balance Balance Assessed: Yes Static Sitting Balance Static Sitting - Balance Support: Feet supported Static Sitting - Level of Assistance: 7: Independent Dynamic Sitting Balance Dynamic Sitting - Balance Support: No upper extremity supported Dynamic Sitting - Level of Assistance: 6: Modified independent (Device/Increase time) Dynamic Sitting - Balance Activities: Lateral lean/weight shifting;Forward lean/weight shifting;Reaching for objects Sitting balance - Comments: Required assist to don R foot post op shoe Dynamic Standing Balance Dynamic Standing - Balance Support: Bilateral upper extremity supported Dynamic Standing - Level of Assistance: 4: Min assist Exercises:   Other Treatments:      Therapy/Group: Individual Therapy  [name] 02/15/2021, 4:33 PM

## 2021-02-16 ENCOUNTER — Encounter (HOSPITAL_COMMUNITY): Payer: Medicare Other

## 2021-02-16 LAB — GLUCOSE, CAPILLARY
Glucose-Capillary: 172 mg/dL — ABNORMAL HIGH (ref 70–99)
Glucose-Capillary: 117 mg/dL — ABNORMAL HIGH (ref 70–99)

## 2021-02-16 NOTE — Plan of Care (Signed)
All goals met. Patient discharged at this time.

## 2021-02-16 NOTE — Progress Notes (Signed)
Inpatient Rehabilitation Care Coordinator Discharge Note   Patient Details  Name: Richard [name] MRN: [id number] Date of Birth: 07-19-1960   Discharge location: Home with daughter who does work, other family members-girlfreind to check on him while she is at work  Length of Stay:    Discharge activity level: supervision-mod/i level  Home/community participation: yes  Patient response RF:FMBWGY Literacy - How often do you need to have someone help you when you read instructions, pamphlets, or other written material from your doctor or pharmacy?: Never  Patient response KZ:LDJTTS Isolation - How often do you feel lonely or isolated from those around you?: Rarely  Services provided included: MD, RD, PT, OT, RN, CM, Pharmacy, SW  Financial Services:  Financial Services Utilized: Medicaid    Choices offered to/list presented to: pt  Follow-up services arranged:  Home Health, DME, Patient/Family request agency HH/DME Home Health Agency: [facility]-pt, ot, rn    DME : [employer]-drop-arm bedside commode [employer]-wheelchair HH/DME Requested Agency: Active pt with [facility] added tub bench per pt request  Patient response to transportation need: Is the patient able to respond to transportation needs?: Yes In the past 12 months, has lack of transportation kept you from medical appointments or from getting medications?: No In the past 12 months, has lack of transportation kept you from meetings, work, or from getting things needed for daily living?: No    Comments (or additional information):pt reached goals ready for discharge  Patient/Family verbalized understanding of follow-up arrangements:  Yes  Individual responsible for coordination of the follow-up plan: mary napper-girlfriend [phone]  Confirmed correct DME delivered: Lucy Chris 02/16/2021    Zofia Peckinpaugh, Lemar Livings

## 2021-02-16 NOTE — Progress Notes (Signed)
Patient is hypotensive, asymptomatic, Held [place].

## 2021-02-16 NOTE — Progress Notes (Signed)
Patient alert and oriented x 4. Discharged at this time in care of family.  Wound care performed to right foot and left BKA today prior to discharge.

## 2021-02-23 ENCOUNTER — Telehealth (HOSPITAL_COMMUNITY): Payer: Self-pay

## 2021-02-23 NOTE — Telephone Encounter (Signed)
Spoke to Ball who agreed to home visit tomorrow. I will see him at 0900. Call complete.

## 2021-02-24 ENCOUNTER — Other Ambulatory Visit (HOSPITAL_COMMUNITY): Payer: Self-pay

## 2021-02-24 NOTE — Progress Notes (Signed)
Paramedicine Encounter    Patient ID: Richard [name], male    DOB: 1961/01/24, 60 y.o.   MRN: [id number]   Spoke to Mr. Carey yesterday evening who agreed to home visit around 0900 this morning. Attempted to see Mr. Keenum in the home today after multiple knocks on the door, doorbell rings and phone call, no success.   I will try again next week.       ACTION: Next visit planned for one week

## 2021-02-25 ENCOUNTER — Telehealth: Payer: Self-pay

## 2021-02-25 ENCOUNTER — Encounter: Payer: Self-pay | Admitting: Physician Assistant

## 2021-02-25 ENCOUNTER — Ambulatory Visit (INDEPENDENT_AMBULATORY_CARE_PROVIDER_SITE_OTHER): Payer: Medicare Other | Admitting: Orthopedic Surgery

## 2021-02-25 ENCOUNTER — Other Ambulatory Visit: Payer: Self-pay

## 2021-02-25 DIAGNOSIS — T8781 Dehiscence of amputation stump: Secondary | ICD-10-CM

## 2021-02-25 DIAGNOSIS — Z89512 Acquired absence of left leg below knee: Secondary | ICD-10-CM

## 2021-02-25 MED ORDER — DOXYCYCLINE HYCLATE 100 MG PO TABS: 100.0000 mg | ORAL_TABLET | Freq: Two times a day (BID) | ORAL | 0 refills | Status: DC

## 2021-02-25 NOTE — Telephone Encounter (Signed)
Kayla,OT called stating that patient missed visit, will make up next week.

## 2021-02-25 NOTE — Progress Notes (Signed)
Office Visit Note   Patient: Richard [name]           Date of Birth: February 05, 1961           MRN: [id number] Visit Date: 02/25/2021              Requested by: Ananias Pilgrim, MD [address] [place],  [place] [address] PCP: Ananias Pilgrim, MD  Chief Complaint  Patient presents with   Left Knee - Follow-up   Right Foot - Follow-up      HPI: Patient is a 60 year old gentleman who is seen in follow-up status post right lower extremity revascularization and right first and second ray amputation.  Patient is 4 weeks status post left transtibial amputation and 3 weeks status post right first and second ray amputation.  Patient is approximately 4 weeks status post revascularization.  Assessment & Plan: Visit Diagnoses:  1. S/P BKA (below knee amputation), left (HCC)   2. Dehiscence of amputation stump (HCC)     Plan:  Patient has inline arterial flow to the ankle and has progressive gangrenous changes of the right first and second ray amputation.  Patient does not have further foot salvage intervention options and will plan for transtibial amputation on the right next Wednesday.  Follow-Up Instructions: Return in about 2 weeks (around 03/11/2021).   Ortho Exam  Patient is alert, oriented, no adenopathy, well-dressed, normal affect, normal respiratory effort. Examination patient has a palpable posterior tibial pulse.  Review of the wound shows progressive gangrenous necrotic wound with exposed bone.  [name] foot is thin and atrophic.  There is no ascending cellulitis.  The wound is open and decompressed.  The calf is soft nontender to palpation no crepitation.  Imaging: No results found.     Labs: Lab Results  Component Value Date   HGBA1C 10.3 (H) 01/17/2021   HGBA1C 10.4 (H) 10/31/2017   LABURIC 8.2 09/22/2019   REPTSTATUS 01/22/2021 FINAL 01/16/2021   CULT  01/16/2021    NO GROWTH 6 DAYS Performed at [facility], [address] [place],  [place] [address]    [facility] STREPTOCOCCUS GROUP G 01/15/2021     Lab Results  Component Value Date   ALBUMIN 1.8 (L) 02/08/2021   ALBUMIN 1.7 (L) 01/28/2021   ALBUMIN 2.0 (L) 01/21/2021   PREALBUMIN 8.5 (L) 01/16/2021    Lab Results  Component Value Date   MG 2.7 (H) 01/16/2021   MG 2.5 (H) 01/15/2021   MG 2.2 10/18/2020   No results found for: [facility]  Lab Results  Component Value Date   PREALBUMIN 8.5 (L) 01/16/2021   CBC EXTENDED Latest Ref Rng & Units 02/08/2021 02/06/2021 02/05/2021  WBC 4.0 - 10.5 K/uL 8.7 12.3(H) 11.6(H)  RBC 4.22 - 5.81 MIL/uL 3.82(L) 4.07(L) 4.74  HGB 13.0 - 17.0 g/dL 10.8(L) 11.8(L) 13.5  HCT 39.0 - 52.0 % 34.1(L) 36.0(L) 42.5  PLT 150 - 400 K/uL 242 200 232  NEUTROABS 1.7 - 7.7 K/uL 6.1 - -  LYMPHSABS 0.7 - 4.0 K/uL 1.4 - -     There is no height or weight on file to calculate BMI.  Orders:  No orders of the defined types were placed in this encounter.  Meds ordered this encounter  Medications   doxycycline (VIBRA-TABS) 100 MG tablet    Sig: Take 1 tablet (100 mg total) by mouth 2 (two) times daily.    Dispense:  60 tablet    Refill:  0  Procedures: No procedures performed  Clinical Data: No additional findings.  ROS:  All other systems negative, except as noted in the HPI. Review of Systems  Objective: Vital Signs: There were no vitals taken for this visit.  Specialty Comments:  No specialty comments available.  PMFS History: Patient Active Problem List   Diagnosis Date Noted   Sleep disturbance    Essential hypertension    Diabetic peripheral neuropathy (HCC)    Labile blood glucose    Left below-knee amputee (HCC) 02/07/2021   PAD (peripheral artery disease) (HCC) 02/01/2021   Below-knee amputation of left lower extremity (HCC) 01/27/2021   S/P BKA (below knee amputation), left (HCC)    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HCC)    SOB (shortness of breath)    Open wound    Bacteremia    Chronic ulcer of left ankle  (HCC)    Severe sepsis without septic shock (HCC)    COPD exacerbation (HCC) 01/15/2021   Chronic systolic CHF (congestive heart failure) (HCC) 01/15/2021   Acute on chronic respiratory failure with hypoxia and hypercapnia (HCC) 01/15/2021   Acute renal failure superimposed on stage 3 chronic kidney disease (HCC) 01/15/2021   Elevated troponin 01/15/2021   Hyponatremia 01/15/2021   CHF exacerbation (HCC) 10/17/2020   Acute on chronic systolic CHF (congestive heart failure) (HCC) 10/16/2020   Acute on chronic combined systolic and diastolic CHF (congestive heart failure) (HCC) 03/15/2018   S/P right and left heart catheterization 11/02/2017   Panic attacks 11/02/2017   Non-STEMI (non-ST elevated myocardial infarction) (HCC)    COPD (chronic obstructive pulmonary disease) (HCC)    Hypertension    CKD (chronic kidney disease)    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HCC)    Presence of implantable cardioverter-defibrillator (ICD)    GERD (gastroesophageal reflux disease)    Diabetes mellitus with diabetic neuropathy, with long-term current use of insulin (HCC)    Past Medical History:  Diagnosis Date   AICD (automatic cardioverter/defibrillator) present 01/11/2017   Anxiety    CHF (congestive heart failure) (HCC)    CKD (chronic kidney disease)    COPD (chronic obstructive pulmonary disease) (HCC)    never smoker, industrial exposure   Diabetes mellitus with diabetic neuropathy, with long-term current use of insulin (HCC)    GERD (gastroesophageal reflux disease)    High cholesterol    History of gout    "take RX qd" (11/01/2017)   Hypertension    Nonobstructive atherosclerosis of coronary artery    On home oxygen therapy    "2L prn" (11/01/2017)   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HCC)    LLL, present for years   Systolic HF (heart failure) (HCC)     Family History  Problem Relation Age of Onset   Hypertension Mother    Diabetes Mother    Hypertension Father    Diabetes Father    Diabetes  Sister    Diabetes Brother     Past Surgical History:  Procedure Laterality Date   ABDOMINAL AORTOGRAM W/LOWER EXTREMITY N/A 02/01/2021   Procedure: ABDOMINAL AORTOGRAM W/LOWER EXTREMITY;  Surgeon: Nada Libman, MD;  Location: [facility] INVASIVE CV LAB;  Service: Cardiovascular;  Laterality: N/A;   AMPUTATION Left 01/21/2021   Procedure: AMPUTATION BELOW KNEE;  Surgeon: Nadara Mustard, MD;  Location: [facility] OR;  Service: Orthopedics;  Laterality: Left;   AMPUTATION Right 02/04/2021   Procedure: RIGHT FOOT 1ST AND 2ND RAY AMPUTATION;  Surgeon: Nadara Mustard, MD;  Location: [facility] OR;  Service:  Orthopedics;  Laterality: Right;   APPLICATION OF WOUND VAC Left 01/21/2021   Procedure: APPLICATION OF WOUND VAC;  Surgeon: Nadara Mustard, MD;  Location: [facility] OR;  Service: Orthopedics;  Laterality: Left;   APPLICATION OF WOUND VAC  02/04/2021   Procedure: APPLICATION OF WOUND VAC;  Surgeon: Nadara Mustard, MD;  Location: [facility] OR;  Service: Orthopedics;;   CATARACT EXTRACTION W/ INTRAOCULAR LENS IMPLANTW/ TRABECULECTOMY Left ~ 2013   "may have done this when I had my other eye OR"   CIRCUMCISION     CORONARY STENT INTERVENTION N/A 11/02/2017   Procedure: CORONARY STENT INTERVENTION;  Surgeon: Laurey Morale, MD;  Location: [facility] INVASIVE CV LAB;  Service: Cardiovascular;  Laterality: N/A;   CORONARY STENT INTERVENTION N/A 11/02/2017   Procedure: CORONARY STENT INTERVENTION;  Surgeon: Lyn Records, MD;  Location: [facility]VASIVE CV LAB;  Service: Cardiovascular;  Laterality: N/A;   EYE SURGERY Left ~ 2013   "fell on something in basement; stuck in my eye"   ICD IMPLANT     Medtronic Dual Chamber 01/11/17   MULTIPLE TOOTH EXTRACTIONS     "pulled all my top teeth"   PERIPHERAL VASCULAR BALLOON ANGIOPLASTY  02/01/2021   Procedure: PERIPHERAL VASCULAR BALLOON ANGIOPLASTY;  Surgeon: Nada Libman, MD;  Location: [facility]VASIVE CV LAB;  Service: Cardiovascular;;  TP Trunk / PT   RIGHT/LEFT HEART CATH AND CORONARY ANGIOGRAPHY N/A  11/02/2017   Procedure: RIGHT/LEFT HEART CATH AND CORONARY ANGIOGRAPHY;  Surgeon: Laurey Morale, MD;  Location: [facility] INVASIVE CV LAB;  Service: Cardiovascular;  Laterality: N/A;   TEE WITHOUT CARDIOVERSION N/A 01/20/2021   Procedure: TRANSESOPHAGEAL ECHOCARDIOGRAM (TEE);  Surgeon: Laurey Morale, MD;  Location: [facility] ENDOSCOPY;  Service: Cardiovascular;  Laterality: N/A;   Social History   Occupational History   Occupation: disability    Comment: stopped working in 2000 d/t occupational exposures  Tobacco Use   Smoking status: Former    Types: Cigarettes   Smokeless tobacco: Never   Tobacco comments:    "smoked when I drank"  Vaping Use   Vaping Use: Never used  Substance and Sexual Activity   Alcohol use: Not Currently    Comment: 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

## 2021-03-01 ENCOUNTER — Encounter (HOSPITAL_COMMUNITY): Payer: Self-pay | Admitting: Orthopedic Surgery

## 2021-03-01 ENCOUNTER — Other Ambulatory Visit: Payer: Self-pay

## 2021-03-01 ENCOUNTER — Encounter: Payer: Medicare Other | Attending: Registered Nurse | Admitting: Registered Nurse

## 2021-03-01 ENCOUNTER — Encounter: Payer: Self-pay | Admitting: Internal Medicine

## 2021-03-01 ENCOUNTER — Telehealth: Payer: Self-pay

## 2021-03-01 ENCOUNTER — Ambulatory Visit (INDEPENDENT_AMBULATORY_CARE_PROVIDER_SITE_OTHER): Payer: Medicare Other

## 2021-03-01 ENCOUNTER — Other Ambulatory Visit: Payer: Self-pay | Admitting: Physician Assistant

## 2021-03-01 DIAGNOSIS — I428 Other cardiomyopathies: Secondary | ICD-10-CM

## 2021-03-01 LAB — CUP PACEART REMOTE DEVICE CHECK
Battery Remaining Longevity: 73 mo
Battery Voltage: 2.99 V
Brady Statistic AP VP Percent: 0 %
Brady Statistic AP VS Percent: 0.24 %
Brady Statistic AS VP Percent: 0.05 %
Brady Statistic AS VS Percent: 99.71 %
Brady Statistic RA Percent Paced: 0.25 %
Brady Statistic RV Percent Paced: 0.05 %
Date Time Interrogation Session: 20220906043824
HighPow Impedance: 54 Ohm
Implantable Lead Implant Date: 20180719
Implantable Lead Implant Date: 20180719
Implantable Lead Location: 753859
Implantable Lead Location: 753860
Implantable Lead Model: 5076
Implantable Pulse Generator Implant Date: 20180719
Lead Channel Impedance Value: 285 Ohm
Lead Channel Impedance Value: 342 Ohm
Lead Channel Impedance Value: 361 Ohm
Lead Channel Pacing Threshold Amplitude: 0.625 V
Lead Channel Pacing Threshold Amplitude: 1 V
Lead Channel Pacing Threshold Pulse Width: 0.4 ms
Lead Channel Pacing Threshold Pulse Width: 0.4 ms
Lead Channel Sensing Intrinsic Amplitude: 3.125 mV
Lead Channel Sensing Intrinsic Amplitude: 3.125 mV
Lead Channel Sensing Intrinsic Amplitude: 4 mV
Lead Channel Sensing Intrinsic Amplitude: 4 mV
Lead Channel Setting Pacing Amplitude: 2 V
Lead Channel Setting Pacing Amplitude: 2 V
Lead Channel Setting Pacing Pulse Width: 0.4 ms
Lead Channel Setting Sensing Sensitivity: 0.3 mV

## 2021-03-01 NOTE — Progress Notes (Signed)
Anesthesia Chart Review:  Case: [id number] Date/Time: 03/02/21 1052   Procedure: RIGHT BELOW KNEE AMPUTATION (Right: Knee)   Anesthesia type: Choice   Pre-op diagnosis: Gangrenous Dehiscence Right Foot   Location: [facility] OR ROOM 03 / [place] [facility]   Surgeons: Newt Minion, MD       DISCUSSION: Patient is a 60 year old male scheduled for the above procedure. 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 He was recently discharged from [place] on 02/16/21.  He was seen in follow-up by Dr. Sharol Given on 02/25/21 and right foot incisions had dehisced with progressive gangrenous changes.  History includes former smoker, COPD (2L O2 as needed) HTN, CAD (s/p NSTEMI, s/p Resolute ONYX DES RCA 11/02/17), cardiomyopathy (diagnosed ~ 2017; predominantly non-ischemic 0/34/74), chronic systolic CHF, ICD (Medtronic Evera MRI XT DR [id number] ICD 01/11/17 placed in [facility]), hypercholesterolemia, DM2 (with neuropathy), CKD, GERD, lung hamartoma (LLL), PVD (left BKA 01/21/21; angioplasty right PT and tibioperoneal trunk 02/01/21; right 1st/2nd toe ray amp 02/04/21).   Last cardiology notation by Dr. Aundra Dubin 01/26/21 during hospitalization for acute on chronic respiratory failure with hypoxia and hypercapnia in setting of severe sepsis and group G strep bacteremia felt related to left heel ulcers with early changes of osteomyelitis.  He underwent left BKA on 01/21/2021.  TEE negative for endocarditis.  EF 15-20% (20-25% by TTE) with known history of predominant nonischemic cardiomyopathy as the degree of coronary disease from 2019 did not explain his cardiomyopathy.  PYP scan in 2021 did not suggest transthyretin amyloidosis.  He notes patient not adherent to medications prior to admission and not always compliant with paramedicine and heart failure clinic follow-up.  Continue medical therapy with out-patient HF Clinic follow-up planned.  Perioperative ICD Rx: Device Information: Clinic EP Physician:  Virl Axe, MD    Device  Type:  Defibrillator Manufacturer and Phone #:  Medtronic: [phone] Pacemaker Dependent?:  Yes.   Date of Last Device Check:  03/01/21 (Remote)         Normal Device Function?:  Yes.     Electrophysiologist's Recommendations:  Have magnet available. Provide continuous ECG monitoring when magnet is used or reprogramming is to be performed.  Procedure should not interfere with device function.  No device programming or magnet placement needed.  Of note Device Clinic RN notation for 03/01/21 indicates remote interrogation showed normal device function but elevated ("high")OptiVol. Reported more SOB at night. He didn't think his pill packs were correct. He notified their staff of planned admission and was advised to contact Device clinic if hospitalization did not occur. She routed to EP Dr. Caryl Comes. Patient is a same day work-up with gangrenous changes in his right foot. Anesthesia team to evaluated on the day of surgery. If indicated, cardiology could be consulted during his stay.   VS:  BP Readings from Last 3 Encounters:  02/16/21 94/72  02/07/21 95/72  02/01/21 102/74   Pulse Readings from Last 3 Encounters:  02/16/21 78  02/07/21 94  02/01/21 76     PROVIDERS: Wallene Dales, MD is listed as PCP Loralie Champagne, MD is HF cardiologist Elijio Miles, PA-C is pulmonology provider. Last visit 12/08/20 ([facility]) atel, Providence Crosby, MD is endocrinologist ([facility])   LABS: Currently, last lab results include: Lab Results  Component Value Date   WBC 8.7 02/08/2021   HGB 10.8 (L) 02/08/2021   HCT 34.1 (L) 02/08/2021   PLT 242 02/08/2021   GLUCOSE 90 02/08/2021   ALT 25 02/08/2021  AST 26 02/08/2021   NA 135 02/08/2021   K 3.6 02/08/2021   CL 97 (L) 02/08/2021   CREATININE 1.04 02/08/2021   BUN 24 (H) 02/08/2021   CO2 30 02/08/2021   TSH 0.473 01/16/2021   HGBA1C 10.3 (H) 01/17/2021     IMAGES: CT Super D Chest 01/25/21: IMPRESSION: 1. No acute findings. 2. Left  lower lobe mass with indolent growth over time. Findings favor a benign lesion such as a hamartoma. 3. Bilateral adrenal nodules, similar to 08/10/2017, indicative of lipid poor adenomas. 4.  Aortic atherosclerosis (ICD10-I70.0). 5. Enlarged pulmonic trunk, indicative of pulmonary arterial hypertension.   EKG: 01/19/21:  Normal sinus rhythm Rightward axis Non-specific intra-ventricular conduction block Possible Anterolateral infarct , age undetermined Abnormal ECG Since last tracing HEART RATE has decreased Confirmed by Nelva Bush [phone]) on 01/19/2021 9:54:36 PM   CV: TEE 01/20/21: Findings: Please see echo section for full report.  Severely dilated LV with normal wall thickness, diffuse hypokinesis with EF 15-20%.  The RV was moderately dilated with moderate systolic dysfunction.  Mildly dilated left atrium, no LA appendage thrombus.  Mildly dilated right atrium.  No ASD/PFO by color doppler.  Trivial TR, no TV vegetation.  No PV vegetation. ICD present in right heart, no vegetation.  Trivial MR, no MV vegetation.  Trileaflet aortic valve, no stenosis, trivial regurgitation.  No AoV vegetation.  Mild plaque descending thoracic aorta.  - Impression: No evidence for endocarditis.    Echo (TTE 01/16/21): IMPRESSIONS   1. The anterior,anteroseptal,anterolateral walls are akinetic. The apex  is akinetic. Marland Kitchen Left ventricular ejection fraction, by estimation, is 20 to  25%. The left ventricle has severely decreased function. The left  ventricle demonstrates regional wall  motion abnormalities (see scoring diagram/findings for description). The  left ventricular internal cavity size was moderately to severely dilated.  Left ventricular diastolic parameters are indeterminate.   2. RV poorly visualized. Grossly appears mildly enlarged with low normal  to mildly decreased function. Right ventricular systolic function was not  well visualized. The right ventricular size is not well  visualized.  Tricuspid regurgitation signal is  inadequate for assessing PA pressure.   3. Left atrial size was mildly dilated.   4. The mitral valve is normal in structure. No evidence of mitral valve  regurgitation. No evidence of mitral stenosis.   5. The aortic valve was not well visualized. Aortic valve regurgitation  is not visualized. No aortic stenosis is present.  (Comparison LVEF 25-30% 02/27/16; 15-20% 11/10/15)   NM Cardiac Amyloid Scan 01/23/20: IMPRESSION: Visual and quantitative assessment (grade 0, H/CL = 1.23) are NOT suggestive of cardiac transthyretin amyloidosis.    RHC/LHC 11/02/17 Aundra Dubin, [place], MD): LM: No significant disease LAD: 80% distal LAD stenosis Ramus intermedius: Moderate ramus with luminal irregularities LCx: Luminal irregularities RCA: Focal 80% stenosis proximal RCA.  Nonstenotic ostial RCA to proximal RCA lesion  Non-stenotic Ost RCA to Prox RCA lesion. Post intervention, there is a 0% residual stenosis. A stent was successfully placed.   1. Elevated right and left heart filling pressures.  2. Low cardiac index at 1.9.   3. 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  4. 80% proximal RCA stenosis.    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  However, the RCA is a large vessel and should be fixed.  We will plan for PCI to RCA today.     He will need ongoing diuresis.  PCI 11/02/17:  Ost RCA to Prox RCA lesion is 85% stenosed. Dist RCA lesion is 40% stenosed. Successful direct stent PCI on the proximal RCA with 85% stenosis and TIMI grade III flow reduced to 0% with TIMI Grade 3 Flow.  An 18 x 4.0 Onyx was deployed at 16 atm x 2 inflations.  Past Medical History:  Diagnosis Date   AICD (automatic cardioverter/defibrillator) present 01/11/2017   Anxiety    CHF (congestive heart failure) (HCC)    CKD (chronic kidney disease)    COPD (chronic obstructive  pulmonary disease) ([place])    never smoker, industrial exposure   Diabetes mellitus with diabetic neuropathy, with long-term current use of insulin (HCC)    GERD (gastroesophageal reflux disease)    High cholesterol    History of gout    "take RX qd" (11/01/2017)   Hypertension    Nonobstructive atherosclerosis of coronary artery    On home oxygen therapy    "2L prn" (11/01/2017)   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HCC)    LLL, present for years   Systolic HF (heart failure) ([place])     Past Surgical History:  Procedure Laterality Date   ABDOMINAL AORTOGRAM W/LOWER EXTREMITY N/A 02/01/2021   Procedure: ABDOMINAL AORTOGRAM W/LOWER EXTREMITY;  Surgeon: Serafina Mitchell, MD;  Location: [place] [facility];  Service: Cardiovascular;  Laterality: N/A;   AMPUTATION Left 01/21/2021   Procedure: AMPUTATION BELOW KNEE;  Surgeon: Newt Minion, MD;  Location: [place];  Service: Orthopedics;  Laterality: Left;   AMPUTATION Right 02/04/2021   Procedure: RIGHT FOOT 1ST AND 2ND RAY AMPUTATION;  Surgeon: Newt Minion, MD;  Location: [place];  Service: Orthopedics;  Laterality: Right;   APPLICATION OF WOUND VAC Left 01/21/2021   Procedure: APPLICATION OF WOUND VAC;  Surgeon: Newt Minion, MD;  Location: [place];  Service: Orthopedics;  Laterality: Left;   APPLICATION OF WOUND VAC  02/04/2021   Procedure: APPLICATION OF WOUND VAC;  Surgeon: Newt Minion, MD;  Location: [place];  Service: Orthopedics;;   CATARACT EXTRACTION W/ INTRAOCULAR LENS IMPLANTW/ TRABECULECTOMY Left ~ 2013   "may have done this when I had my other eye OR"   [place] N/A 11/02/2017   Procedure: CORONARY STENT INTERVENTION;  Surgeon: Larey Dresser, MD;  Location: [place] [facility];  Service: Cardiovascular;  Laterality: N/A;   CORONARY STENT INTERVENTION N/A 11/02/2017   Procedure: CORONARY STENT INTERVENTION;  Surgeon: Belva Crome, MD;  Location: [place] [facility];  Service: Cardiovascular;  Laterality: N/A;    EYE SURGERY Left ~ 2013   "fell on something in basement; stuck in my eye"   ICD IMPLANT     Medtronic Dual Chamber 01/11/17   MULTIPLE TOOTH EXTRACTIONS     "pulled all my top teeth"   PERIPHERAL VASCULAR BALLOON ANGIOPLASTY  02/01/2021   Procedure: PERIPHERAL VASCULAR BALLOON ANGIOPLASTY;  Surgeon: Serafina Mitchell, MD;  Location: [place] [facility];  Service: Cardiovascular;;  TP Trunk / PT   RIGHT/LEFT HEART CATH AND CORONARY ANGIOGRAPHY N/A 11/02/2017   Procedure: RIGHT/LEFT HEART CATH AND CORONARY ANGIOGRAPHY;  Surgeon: Larey Dresser, MD;  Location: [place] [facility];  Service: Cardiovascular;  Laterality: N/A;   TEE WITHOUT CARDIOVERSION N/A 01/20/2021   Procedure: TRANSESOPHAGEAL ECHOCARDIOGRAM (TEE);  Surgeon: Larey Dresser, MD;  Location: [facility];  Service: Cardiovascular;  Laterality: N/A;    MEDICATIONS: No current facility-administered medications for this encounter.  acetaminophen (TYLENOL) 650 MG CR tablet   albuterol (VENTOLIN HFA) 108 (90 Base) MCG/ACT inhaler   allopurinol (ZYLOPRIM) 100 MG tablet   ascorbic acid (VITAMIN C) 1000 MG tablet   aspirin 81 MG chewable tablet   atorvastatin (LIPITOR) 40 MG tablet   bisoprolol (ZEBETA) 5 MG tablet   BREO ELLIPTA 100-25 MCG/INH AEPB   busPIRone (BUSPAR) 5 MG tablet   clopidogrel (PLAVIX) 75 MG tablet   diclofenac Sodium (VOLTAREN) 1 % GEL   docusate sodium (COLACE) 100 MG capsule   doxycycline (VIBRA-TABS) 100 MG tablet   DULoxetine (CYMBALTA) 60 MG capsule   empagliflozin (JARDIANCE) 10 MG TABS tablet   fenofibrate (TRICOR) 145 MG tablet   gabapentin (NEURONTIN) 100 MG capsule   HYDROcodone-acetaminophen (NORCO/VICODIN) 5-325 MG tablet   insulin glargine-yfgn (SEMGLEE, YFGN,) 100 UNIT/ML injection   isosorbide mononitrate (IMDUR) 30 MG 24 hr tablet   MAGNESIUM-OXIDE 400 (241.3 Mg) MG tablet   melatonin 3 MG TABS tablet   Multiple Vitamin (MULTIVITAMIN WITH MINERALS) TABS tablet   polyethylene glycol  (MIRALAX / GLYCOLAX) 17 g packet   sacubitril-valsartan (ENTRESTO) 24-26 MG   spironolactone (ALDACTONE) 25 MG tablet   Torsemide 40 MG TABS   zinc sulfate 220 (50 Zn) MG capsule  Perioperative Plavix instructions per Ortho.  Myra Gianotti, PA-C Surgical Short Stay/Anesthesiology [facility] Phone [phone] [facility] Phone [phone] 03/01/2021 5:34 PM

## 2021-03-01 NOTE — Anesthesia Preprocedure Evaluation (Addendum)
Anesthesia Evaluation  Patient identified by MRN, date of birth, ID band Patient awake    Reviewed: Allergy & Precautions, NPO status , [name] Chart, lab work & pertinent test results  Airway Mallampati: II  TM Distance: >3 FB Neck ROM: Full    Dental no notable dental hx. (+) Edentulous Upper   Pulmonary COPD (on 2L [place]),  oxygen dependent, former smoker,    Pulmonary exam normal breath sounds clear to auscultation       Cardiovascular hypertension, Pt. on medications + CAD, + Past MI (NStemi), + Peripheral Vascular Disease and +CHF  Normal cardiovascular exam+ Cardiac Defibrillator  Rhythm:Regular Rate:Normal  01/16/21 Echo    1. The anterior,anteroseptal,anterolateral walls are akinetic. The apex  is akinetic. Marland Kitchen Left ventricular ejection fraction, by estimation, is 20 to  25%. The left ventricle has severely decreased function. The left  ventricle demonstrates regional wall  motion abnormalities (see scoring diagram/findings for description). The  left ventricular internal cavity size was moderately to severely dilated.  Left ventricular diastolic parameters are indeterminate.  2. RV poorly visualized. Grossly appears mildly enlarged with low normal  to mildly decreased function. Right ventricular systolic function was not  well visualized. The right ventricular size is not well visualized.  Tricuspid regurgitation signal is  inadequate for assessing PA pressure.  3. Left atrial size was mildly dilated.  4. The mitral valve is normal in structure. No evidence of mitral valve  regurgitation. No evidence of mitral stenosis.  5. The aortic valve was not well visualized. Aortic valve regurgitation  is not visualized. No aortic stenosis is present.    Neuro/Psych Anxiety    GI/Hepatic Neg liver ROS, GERD  ,  Endo/Other  diabetes, Type 2, Insulin Dependent, Oral Hypoglycemic Agents  Renal/GU Renal diseaseLab  Results      Component                Value               Date                      CREATININE               1.04                02/08/2021                BUN                      24 (H)              02/08/2021                NA                       135                 02/08/2021                K                        3.6                 02/08/2021                CL  97 (L)              02/08/2021                CO2                      30                  02/08/2021                Musculoskeletal   Abdominal (+) - obese,   Peds  Hematology Lab Results      Component                Value               Date                      WBC                      8.7                 02/08/2021                HGB                      10.8 (L)            02/08/2021                HCT                      34.1 (L)            02/08/2021                MCV                      89.3                02/08/2021                PLT                      242                 02/08/2021              Anesthesia Other Findings L BKA  All: ASA codeine  Reproductive/Obstetrics                           Anesthesia Physical Anesthesia Plan  ASA: 4  Anesthesia Plan: Regional   Post-op Pain Management:    Induction:   PONV Risk Score and Plan: 2 and Treatment may vary due to age or medical condition, Ondansetron and Propofol infusion  Airway Management Planned: Natural Airway and Nasal Cannula  Additional Equipment: None  Intra-op Plan:   Post-operative Plan:   Informed Consent: I have reviewed the patients History and Physical, chart, labs and discussed the procedure including the risks, benefits and alternatives for the proposed anesthesia with the patient or authorized representative who has indicated his/her understanding and acceptance.     Dental advisory given  Plan Discussed with: CRNA  Anesthesia Plan Comments: (See PAT note written  03/01/2021 by Myra Gianotti, PA-C.  R Popliteal + R adductor blocks plus mac)  Anesthesia Quick Evaluation  

## 2021-03-01 NOTE — Progress Notes (Signed)
PCP - Dr. Josie Saunders Cardiologist - Dr. Shirlee Latch EKG - 01/19/21 Chest x-ray -  ECHO - 01/20/21 Cardiac Cath - 11/02/17 CPAP -   Fasting Blood Sugar:  160s Checks Blood Sugar:  3x/day --- pt has continuous glucose monitor on right arm  Blood Thinner Instructions: Follow your surgeon's instructions on when to stop Aspirin and plavix.  If no instructions were given by your surgeon then you will need to call the office to get those instructions.    Aspirin Instructions:   ERAS Protcol - 0800  COVID TEST- DOS  Anesthesia review: n/a  -------------  SDW INSTRUCTIONS:  Your procedure is scheduled on 9/7. Please report to [facility] Main Entrance "A" at 0800 A.M., and check in at the Admitting office. Call this number if you have problems the morning of surgery: [phone]   Remember: Do not eat or drink after midnight the night before your surgery   Medications to take morning of surgery with a sip of water include: (Medications come in a bubble pack - pt to not take any medications)   ** PLEASE check your blood sugar the morning of your surgery when you wake up and every 2 hours until you get to the Short Stay unit.  If your blood sugar is less than 70 mg/dL, you will need to treat for low blood sugar: Do not take insulin. Treat a low blood sugar (less than 70 mg/dL) with  cup of clear juice (cranberry or apple), 4 glucose tablets, OR glucose gel. Recheck blood sugar in 15 minutes after treatment (to make sure it is greater than 70 mg/dL). If your blood sugar is not greater than 70 mg/dL on recheck, call [phone] for further instructions.  Take half of sliding scale insulin dose if CBG >220   As of today, STOP taking any Aspirin (unless otherwise instructed by your surgeon), Aleve, Naproxen, Ibuprofen, Motrin, Advil, Goody's, BC's, all herbal medications, fish oil, and all vitamins.    The Morning of Surgery Do not wear jewelry Do not wear lotions, powders, colognes, or  deodorant Do not shave 48 hours prior to surgery.   Do not bring valuables to the hospital. [facility] is not responsible for any belongings or valuables.  If you are a smoker, DO NOT Smoke 24 hours prior to surgery  If you wear a CPAP at night please bring your mask the morning of surgery   Remember that you must have someone to transport you home after your surgery, and remain with you for 24 hours if you are discharged the same day.  Please bring cases for contacts, glasses, hearing aids, dentures or bridgework because it cannot be worn into surgery.   Patients discharged the day of surgery will not be allowed to drive home.   Please shower the NIGHT BEFORE/MORNING OF SURGERY (use antibacterial soap like DIAL soap if possible). Wear comfortable clothes the morning of surgery. Oral Hygiene is also important to reduce your risk of infection.  Remember - BRUSH YOUR TEETH THE MORNING OF SURGERY WITH YOUR REGULAR TOOTHPASTE  Patient denies shortness of breath, fever, cough and chest pain.

## 2021-03-01 NOTE — Telephone Encounter (Signed)
Scheduled remote reviewed. Normal device function.   Optivol crossed threshold 8/14, ongoing, triage HF "high" Route to triage, followed by HF Next remote 91 days.  Successful telephone call to patient to follow up on volume overload. Patient states he is more short of breath at night however will be hospitalized tomorrow for scheduled lower extremity amputation. Known HF and followed by paramedicine program. Patient also states he does not think his pill packs from [facility] is correct. He believes his entresto is not in all of his pillpacks. Patient is instructed to call device clinic if scheduled hospitalization does not occur so medications can be verified with pharmacy. Patient verbalized understanding. Transmission exported to Dr. Graciela Husbands for review.

## 2021-03-01 NOTE — Progress Notes (Signed)
PERIOPERATIVE PRESCRIPTION FOR IMPLANTED CARDIAC DEVICE PROGRAMMING  Patient Information: Name:  Richard [name]  DOB:  04/03/1961  MRN:  [id number]    Planned Procedure:  Right below knee amputation  Surgeon:  Dr. Aldean Baker  Date of Procedure:  03/02/21  Cautery will be used.  Position during surgery:     Please send documentation back to:  [facility] (Fax # [phone])  Device Information:  Clinic EP Physician:  Sherryl Manges, MD   Device Type:  Defibrillator Manufacturer and Phone #:  Medtronic: [phone] Pacemaker Dependent?:  Yes.   Date of Last Device Check:  03/01/21 (Remote) Normal Device Function?:  Yes.    Electrophysiologist's Recommendations:  Have magnet available. Provide continuous ECG monitoring when magnet is used or reprogramming is to be performed.  Procedure should not interfere with device function.  No device programming or magnet placement needed.  Per Device Clinic Standing Orders, Lenor Coffin, RN  5:07 PM 03/01/2021

## 2021-03-02 ENCOUNTER — Encounter (HOSPITAL_COMMUNITY): Payer: Self-pay | Admitting: Orthopedic Surgery

## 2021-03-02 ENCOUNTER — Inpatient Hospital Stay (HOSPITAL_COMMUNITY): Payer: Medicare Other | Admitting: Vascular Surgery

## 2021-03-02 ENCOUNTER — Inpatient Hospital Stay (HOSPITAL_COMMUNITY)
Admission: RE | Admit: 2021-03-02 | Discharge: 2021-03-08 | DRG: 475 | Disposition: A | Discharge status: Home Health | Payer: Medicare Other | Attending: Orthopedic Surgery | Admitting: Orthopedic Surgery

## 2021-03-02 ENCOUNTER — Encounter (HOSPITAL_COMMUNITY)
Admission: RE | Disposition: A | Discharge status: Home Health | Payer: Self-pay | Source: Home / Self Care | Attending: Orthopedic Surgery

## 2021-03-02 DIAGNOSIS — Z20822 Contact with and (suspected) exposure to covid-19: Secondary | ICD-10-CM | POA: Diagnosis present

## 2021-03-02 DIAGNOSIS — N189 Chronic kidney disease, unspecified: Secondary | ICD-10-CM | POA: Diagnosis present

## 2021-03-02 DIAGNOSIS — Z9981 Dependence on supplemental oxygen: Secondary | ICD-10-CM

## 2021-03-02 DIAGNOSIS — E11621 Type 2 diabetes mellitus with foot ulcer: Secondary | ICD-10-CM | POA: Diagnosis present

## 2021-03-02 DIAGNOSIS — I428 Other cardiomyopathies: Secondary | ICD-10-CM | POA: Diagnosis present

## 2021-03-02 DIAGNOSIS — E1140 Type 2 diabetes mellitus with diabetic neuropathy, unspecified: Secondary | ICD-10-CM | POA: Diagnosis present

## 2021-03-02 DIAGNOSIS — Y835 Amputation of limb(s) as the cause of abnormal reaction of the patient, or of later complication, without mention of misadventure at the time of the procedure: Secondary | ICD-10-CM | POA: Diagnosis present

## 2021-03-02 DIAGNOSIS — Z794 Long term (current) use of insulin: Secondary | ICD-10-CM

## 2021-03-02 DIAGNOSIS — E1169 Type 2 diabetes mellitus with other specified complication: Secondary | ICD-10-CM | POA: Diagnosis present

## 2021-03-02 DIAGNOSIS — E1151 Type 2 diabetes mellitus with diabetic peripheral angiopathy without gangrene: Secondary | ICD-10-CM | POA: Diagnosis not present

## 2021-03-02 DIAGNOSIS — E1152 Type 2 diabetes mellitus with diabetic peripheral angiopathy with gangrene: Secondary | ICD-10-CM | POA: Diagnosis present

## 2021-03-02 DIAGNOSIS — Z7984 Long term (current) use of oral hypoglycemic drugs: Secondary | ICD-10-CM

## 2021-03-02 DIAGNOSIS — M86271 Subacute osteomyelitis, right ankle and foot: Principal | ICD-10-CM

## 2021-03-02 DIAGNOSIS — E7800 Pure hypercholesterolemia, unspecified: Secondary | ICD-10-CM | POA: Diagnosis present

## 2021-03-02 DIAGNOSIS — Z7982 Long term (current) use of aspirin: Secondary | ICD-10-CM

## 2021-03-02 DIAGNOSIS — Z87891 Personal history of nicotine dependence: Secondary | ICD-10-CM

## 2021-03-02 DIAGNOSIS — Z7902 Long term (current) use of antithrombotics/antiplatelets: Secondary | ICD-10-CM

## 2021-03-02 DIAGNOSIS — L97829 Non-pressure chronic ulcer of other part of left lower leg with unspecified severity: Secondary | ICD-10-CM | POA: Diagnosis present

## 2021-03-02 DIAGNOSIS — K219 Gastro-esophageal reflux disease without esophagitis: Secondary | ICD-10-CM | POA: Diagnosis present

## 2021-03-02 DIAGNOSIS — M79604 Pain in right leg: Secondary | ICD-10-CM | POA: Diagnosis not present

## 2021-03-02 DIAGNOSIS — J449 Chronic obstructive pulmonary disease, unspecified: Secondary | ICD-10-CM | POA: Diagnosis present

## 2021-03-02 DIAGNOSIS — E1122 Type 2 diabetes mellitus with diabetic chronic kidney disease: Secondary | ICD-10-CM | POA: Diagnosis present

## 2021-03-02 DIAGNOSIS — Z8249 Family history of ischemic heart disease and other diseases of the circulatory system: Secondary | ICD-10-CM | POA: Diagnosis not present

## 2021-03-02 DIAGNOSIS — Z79899 Other long term (current) drug therapy: Secondary | ICD-10-CM | POA: Diagnosis not present

## 2021-03-02 DIAGNOSIS — Z833 Family history of diabetes mellitus: Secondary | ICD-10-CM | POA: Diagnosis not present

## 2021-03-02 DIAGNOSIS — F419 Anxiety disorder, unspecified: Secondary | ICD-10-CM | POA: Diagnosis present

## 2021-03-02 DIAGNOSIS — I5022 Chronic systolic (congestive) heart failure: Secondary | ICD-10-CM | POA: Diagnosis present

## 2021-03-02 DIAGNOSIS — Z7951 Long term (current) use of inhaled steroids: Secondary | ICD-10-CM

## 2021-03-02 DIAGNOSIS — T8130XA Disruption of wound, unspecified, initial encounter: Secondary | ICD-10-CM

## 2021-03-02 DIAGNOSIS — I252 Old myocardial infarction: Secondary | ICD-10-CM

## 2021-03-02 DIAGNOSIS — L97519 Non-pressure chronic ulcer of other part of right foot with unspecified severity: Secondary | ICD-10-CM | POA: Diagnosis present

## 2021-03-02 DIAGNOSIS — Z95810 Presence of automatic (implantable) cardiac defibrillator: Secondary | ICD-10-CM

## 2021-03-02 DIAGNOSIS — I130 Hypertensive heart and chronic kidney disease with heart failure and stage 1 through stage 4 chronic kidney disease, or unspecified chronic kidney disease: Secondary | ICD-10-CM | POA: Diagnosis present

## 2021-03-02 DIAGNOSIS — E11622 Type 2 diabetes mellitus with other skin ulcer: Secondary | ICD-10-CM | POA: Diagnosis present

## 2021-03-02 DIAGNOSIS — Z9862 Peripheral vascular angioplasty status: Secondary | ICD-10-CM | POA: Diagnosis not present

## 2021-03-02 DIAGNOSIS — I2510 Atherosclerotic heart disease of native coronary artery without angina pectoris: Secondary | ICD-10-CM | POA: Diagnosis present

## 2021-03-02 DIAGNOSIS — T8781 Dehiscence of amputation stump: Principal | ICD-10-CM | POA: Diagnosis present

## 2021-03-02 DIAGNOSIS — Z955 Presence of coronary angioplasty implant and graft: Secondary | ICD-10-CM

## 2021-03-02 DIAGNOSIS — Z89511 Acquired absence of right leg below knee: Secondary | ICD-10-CM | POA: Diagnosis not present

## 2021-03-02 HISTORY — PX: AMPUTATION: SHX166

## 2021-03-02 HISTORY — PX: APPLICATION OF WOUND VAC: SHX5189

## 2021-03-02 LAB — GLUCOSE, CAPILLARY
Glucose-Capillary: 173 mg/dL — ABNORMAL HIGH (ref 70–99)
Glucose-Capillary: 176 mg/dL — ABNORMAL HIGH (ref 70–99)
Glucose-Capillary: 236 mg/dL — ABNORMAL HIGH (ref 70–99)

## 2021-03-02 LAB — SURGICAL PCR SCREEN
MRSA, PCR: NEGATIVE
Staphylococcus aureus: NEGATIVE

## 2021-03-02 LAB — SARS CORONAVIRUS 2 BY RT PCR (HOSPITAL ORDER, PERFORMED IN ~~LOC~~ HOSPITAL LAB): SARS Coronavirus 2: NEGATIVE

## 2021-03-02 SURGERY — AMPUTATION BELOW KNEE
Anesthesia: Regional | Site: Knee | Laterality: Right

## 2021-03-02 MED ORDER — CHLORHEXIDINE GLUCONATE 0.12 % MT SOLN
15.0000 mL | Freq: Once | OROMUCOSAL | Status: AC
  Administered 2021-03-02: 15 mL via OROMUCOSAL

## 2021-03-02 MED ORDER — OXYCODONE HCL 5 MG PO TABS: 5.0000 mg | ORAL_TABLET | Freq: Once | ORAL | Status: DC | PRN

## 2021-03-02 MED ORDER — EMPAGLIFLOZIN 10 MG PO TABS
10.0000 mg | ORAL_TABLET | Freq: Every day | ORAL | Status: DC
  Administered 2021-03-03 – 2021-03-08 (×6): 10 mg via ORAL
  Filled 2021-03-02 (×6): qty 1

## 2021-03-02 MED ORDER — FENTANYL CITRATE (PF) 100 MCG/2ML IJ SOLN
INTRAMUSCULAR | Status: AC
  Filled 2021-03-02: qty 2

## 2021-03-02 MED ORDER — PANTOPRAZOLE SODIUM 40 MG PO TBEC
40.0000 mg | DELAYED_RELEASE_TABLET | Freq: Every evening | ORAL | Status: DC
  Administered 2021-03-02 – 2021-03-07 (×6): 40 mg via ORAL
  Filled 2021-03-02 (×6): qty 1

## 2021-03-02 MED ORDER — ONDANSETRON HCL 4 MG/2ML IJ SOLN
INTRAMUSCULAR | Status: DC | PRN
  Administered 2021-03-02: 4 mg via INTRAVENOUS

## 2021-03-02 MED ORDER — ORAL CARE MOUTH RINSE: 15.0000 mL | Freq: Once | OROMUCOSAL | Status: AC

## 2021-03-02 MED ORDER — JUVEN PO PACK
1.0000 | PACK | Freq: Two times a day (BID) | ORAL | Status: DC
  Administered 2021-03-03 – 2021-03-08 (×8): 1 via ORAL
  Filled 2021-03-02 (×10): qty 1

## 2021-03-02 MED ORDER — HYDROMORPHONE HCL 1 MG/ML IJ SOLN: 0.2500 mg | INTRAMUSCULAR | Status: DC | PRN

## 2021-03-02 MED ORDER — CHLORHEXIDINE GLUCONATE 0.12 % MT SOLN
OROMUCOSAL | Status: AC
  Filled 2021-03-02: qty 15

## 2021-03-02 MED ORDER — ASPIRIN 81 MG PO CHEW
81.0000 mg | CHEWABLE_TABLET | Freq: Every day | ORAL | Status: DC
  Administered 2021-03-02 – 2021-03-08 (×7): 81 mg via ORAL
  Filled 2021-03-02 (×7): qty 1

## 2021-03-02 MED ORDER — PROPOFOL 500 MG/50ML IV EMUL
INTRAVENOUS | Status: DC | PRN
  Administered 2021-03-02: 100 ug/kg/min via INTRAVENOUS

## 2021-03-02 MED ORDER — ATORVASTATIN CALCIUM 40 MG PO TABS
40.0000 mg | ORAL_TABLET | Freq: Every day | ORAL | Status: DC
  Administered 2021-03-02 – 2021-03-08 (×7): 40 mg via ORAL
  Filled 2021-03-02 (×7): qty 1

## 2021-03-02 MED ORDER — SACUBITRIL-VALSARTAN 24-26 MG PO TABS
1.0000 | ORAL_TABLET | Freq: Two times a day (BID) | ORAL | Status: DC
  Administered 2021-03-04 – 2021-03-08 (×9): 1 via ORAL
  Filled 2021-03-02 (×10): qty 1

## 2021-03-02 MED ORDER — GABAPENTIN 100 MG PO CAPS
200.0000 mg | ORAL_CAPSULE | Freq: Three times a day (TID) | ORAL | Status: DC
  Administered 2021-03-02 – 2021-03-04 (×7): 200 mg via ORAL
  Filled 2021-03-02 (×7): qty 2

## 2021-03-02 MED ORDER — AMISULPRIDE (ANTIEMETIC) 5 MG/2ML IV SOLN: 10.0000 mg | Freq: Once | INTRAVENOUS | Status: DC | PRN

## 2021-03-02 MED ORDER — ALUM & MAG HYDROXIDE-SIMETH 200-200-20 MG/5ML PO SUSP: 15.0000 mL | ORAL | Status: DC | PRN

## 2021-03-02 MED ORDER — TRANEXAMIC ACID-NACL 1000-0.7 MG/100ML-% IV SOLN
1000.0000 mg | Freq: Once | INTRAVENOUS | Status: AC
  Administered 2021-03-02: 1000 mg via INTRAVENOUS
  Filled 2021-03-02 (×3): qty 100

## 2021-03-02 MED ORDER — MAGNESIUM CITRATE PO SOLN: 1.0000 | Freq: Once | ORAL | Status: DC | PRN

## 2021-03-02 MED ORDER — DOCUSATE SODIUM 100 MG PO CAPS
100.0000 mg | ORAL_CAPSULE | Freq: Every day | ORAL | Status: DC
  Administered 2021-03-03 – 2021-03-07 (×5): 100 mg via ORAL
  Filled 2021-03-02 (×6): qty 1

## 2021-03-02 MED ORDER — INSULIN ASPART 100 UNIT/ML IJ SOLN
0.0000 [IU] | Freq: Three times a day (TID) | INTRAMUSCULAR | Status: DC
  Administered 2021-03-03: 8 [IU] via SUBCUTANEOUS
  Administered 2021-03-03: 3 [IU] via SUBCUTANEOUS
  Administered 2021-03-03: 2 [IU] via SUBCUTANEOUS
  Administered 2021-03-04 – 2021-03-05 (×2): 3 [IU] via SUBCUTANEOUS
  Administered 2021-03-06 (×2): 2 [IU] via SUBCUTANEOUS
  Administered 2021-03-06 – 2021-03-07 (×2): 3 [IU] via SUBCUTANEOUS
  Administered 2021-03-08: 2 [IU] via SUBCUTANEOUS

## 2021-03-02 MED ORDER — CEFAZOLIN SODIUM-DEXTROSE 2-4 GM/100ML-% IV SOLN
INTRAVENOUS | Status: AC
  Filled 2021-03-02: qty 100

## 2021-03-02 MED ORDER — ISOSORBIDE MONONITRATE ER 30 MG PO TB24
30.0000 mg | ORAL_TABLET | Freq: Every day | ORAL | Status: DC
  Administered 2021-03-03 – 2021-03-08 (×6): 30 mg via ORAL
  Filled 2021-03-02 (×6): qty 1

## 2021-03-02 MED ORDER — CLONIDINE HCL (ANALGESIA) 100 MCG/ML EP SOLN
EPIDURAL | Status: DC | PRN
  Administered 2021-03-02 (×2): 100 ug

## 2021-03-02 MED ORDER — ONDANSETRON HCL 4 MG/2ML IJ SOLN
4.0000 mg | Freq: Four times a day (QID) | INTRAMUSCULAR | Status: DC | PRN
  Administered 2021-03-02: 4 mg via INTRAVENOUS
  Filled 2021-03-02: qty 2

## 2021-03-02 MED ORDER — 0.9 % SODIUM CHLORIDE (POUR BTL) OPTIME
TOPICAL | Status: DC | PRN
  Administered 2021-03-02: 1000 mL

## 2021-03-02 MED ORDER — ONDANSETRON HCL 4 MG/2ML IJ SOLN: 4.0000 mg | Freq: Once | INTRAMUSCULAR | Status: DC | PRN

## 2021-03-02 MED ORDER — CEFAZOLIN SODIUM-DEXTROSE 2-4 GM/100ML-% IV SOLN
2.0000 g | INTRAVENOUS | Status: AC
  Administered 2021-03-02: 2 g via INTRAVENOUS

## 2021-03-02 MED ORDER — HYDROMORPHONE HCL 1 MG/ML IJ SOLN
0.5000 mg | INTRAMUSCULAR | Status: DC | PRN
Start: 2021-03-02 — End: 2021-03-08
  Administered 2021-03-03 (×3): 0.5 mg via INTRAVENOUS
  Filled 2021-03-02 (×3): qty 0.5

## 2021-03-02 MED ORDER — BISACODYL 5 MG PO TBEC: 5.0000 mg | DELAYED_RELEASE_TABLET | Freq: Every day | ORAL | Status: DC | PRN

## 2021-03-02 MED ORDER — OXYCODONE HCL 5 MG/5ML PO SOLN: 5.0000 mg | Freq: Once | ORAL | Status: DC | PRN

## 2021-03-02 MED ORDER — ALBUTEROL SULFATE (2.5 MG/3ML) 0.083% IN NEBU: 3.0000 mL | INHALATION_SOLUTION | RESPIRATORY_TRACT | Status: DC | PRN

## 2021-03-02 MED ORDER — ACETAMINOPHEN 10 MG/ML IV SOLN: 1000.0000 mg | Freq: Once | INTRAVENOUS | Status: DC | PRN

## 2021-03-02 MED ORDER — TORSEMIDE 20 MG PO TABS
40.0000 mg | ORAL_TABLET | Freq: Every day | ORAL | Status: DC
  Administered 2021-03-03 – 2021-03-08 (×6): 40 mg via ORAL
  Filled 2021-03-02 (×6): qty 2

## 2021-03-02 MED ORDER — SPIRONOLACTONE 12.5 MG HALF TABLET
12.5000 mg | ORAL_TABLET | Freq: Every day | ORAL | Status: DC
  Administered 2021-03-03 – 2021-03-08 (×6): 12.5 mg via ORAL
  Filled 2021-03-02 (×6): qty 1

## 2021-03-02 MED ORDER — POLYETHYLENE GLYCOL 3350 17 G PO PACK: 17.0000 g | PACK | Freq: Every day | ORAL | Status: DC | PRN

## 2021-03-02 MED ORDER — OXYCODONE HCL 5 MG PO TABS
5.0000 mg | ORAL_TABLET | ORAL | Status: DC | PRN
  Administered 2021-03-02: 5 mg via ORAL
  Administered 2021-03-03 (×3): 10 mg via ORAL
  Administered 2021-03-07: 5 mg via ORAL
  Filled 2021-03-02 (×2): qty 2
  Filled 2021-03-02: qty 1
  Filled 2021-03-02 (×3): qty 2

## 2021-03-02 MED ORDER — BISOPROLOL FUMARATE 5 MG PO TABS
2.5000 mg | ORAL_TABLET | Freq: Every day | ORAL | Status: DC
  Administered 2021-03-02 – 2021-03-08 (×7): 2.5 mg via ORAL
  Filled 2021-03-02 (×7): qty 1

## 2021-03-02 MED ORDER — PHENOL 1.4 % MT LIQD: 1.0000 | OROMUCOSAL | Status: DC | PRN

## 2021-03-02 MED ORDER — FENOFIBRATE 54 MG PO TABS
54.0000 mg | ORAL_TABLET | Freq: Every day | ORAL | Status: DC
  Administered 2021-03-03 – 2021-03-08 (×6): 54 mg via ORAL
  Filled 2021-03-02 (×6): qty 1

## 2021-03-02 MED ORDER — ASCORBIC ACID 500 MG PO TABS
1000.0000 mg | ORAL_TABLET | Freq: Every day | ORAL | Status: DC
  Administered 2021-03-03 – 2021-03-08 (×6): 1000 mg via ORAL
  Filled 2021-03-02 (×6): qty 2

## 2021-03-02 MED ORDER — BUSPIRONE HCL 5 MG PO TABS
5.0000 mg | ORAL_TABLET | Freq: Two times a day (BID) | ORAL | Status: DC
  Administered 2021-03-02 – 2021-03-08 (×12): 5 mg via ORAL
  Filled 2021-03-02 (×12): qty 1

## 2021-03-02 MED ORDER — LACTATED RINGERS IV SOLN: INTRAVENOUS | Status: DC

## 2021-03-02 MED ORDER — GUAIFENESIN-DM 100-10 MG/5ML PO SYRP
15.0000 mL | ORAL_SOLUTION | ORAL | Status: DC | PRN
  Administered 2021-03-04: 15 mL via ORAL
  Filled 2021-03-02: qty 15

## 2021-03-02 MED ORDER — ZINC SULFATE 220 (50 ZN) MG PO CAPS
220.0000 mg | ORAL_CAPSULE | Freq: Every day | ORAL | Status: DC
  Administered 2021-03-03 – 2021-03-08 (×6): 220 mg via ORAL
  Filled 2021-03-02 (×6): qty 1

## 2021-03-02 MED ORDER — CLOPIDOGREL BISULFATE 75 MG PO TABS
75.0000 mg | ORAL_TABLET | Freq: Every day | ORAL | Status: DC
  Administered 2021-03-02 – 2021-03-08 (×7): 75 mg via ORAL
  Filled 2021-03-02 (×7): qty 1

## 2021-03-02 MED ORDER — CEFAZOLIN SODIUM-DEXTROSE 2-4 GM/100ML-% IV SOLN
2.0000 g | Freq: Three times a day (TID) | INTRAVENOUS | Status: AC
Start: 2021-03-02 — End: 2021-03-03
  Administered 2021-03-02 – 2021-03-03 (×2): 2 g via INTRAVENOUS
  Filled 2021-03-02 (×4): qty 100

## 2021-03-02 MED ORDER — ACETAMINOPHEN 325 MG PO TABS
325.0000 mg | ORAL_TABLET | Freq: Four times a day (QID) | ORAL | Status: DC | PRN
  Administered 2021-03-06 – 2021-03-07 (×2): 650 mg via ORAL
  Filled 2021-03-02 (×2): qty 2

## 2021-03-02 MED ORDER — SODIUM CHLORIDE 0.9 % IV SOLN: INTRAVENOUS | Status: DC

## 2021-03-02 MED ORDER — ROPIVACAINE HCL 5 MG/ML IJ SOLN
INTRAMUSCULAR | Status: DC | PRN
  Administered 2021-03-02: 30 mL via PERINEURAL
  Administered 2021-03-02: 15 mL via PERINEURAL

## 2021-03-02 MED ORDER — MIDAZOLAM HCL 2 MG/2ML IJ SOLN
INTRAMUSCULAR | Status: AC
  Administered 2021-03-02: 2 mg via INTRAVENOUS
  Filled 2021-03-02: qty 2

## 2021-03-02 MED ORDER — MAGNESIUM OXIDE -MG SUPPLEMENT 400 (240 MG) MG PO TABS
400.0000 mg | ORAL_TABLET | Freq: Every day | ORAL | Status: DC
  Administered 2021-03-03 – 2021-03-08 (×6): 400 mg via ORAL
  Filled 2021-03-02 (×6): qty 1

## 2021-03-02 MED ORDER — ONDANSETRON HCL 4 MG/2ML IJ SOLN
INTRAMUSCULAR | Status: AC
  Filled 2021-03-02: qty 2

## 2021-03-02 MED ORDER — DULOXETINE HCL 60 MG PO CPEP
60.0000 mg | ORAL_CAPSULE | Freq: Every day | ORAL | Status: DC
  Administered 2021-03-02 – 2021-03-08 (×7): 60 mg via ORAL
  Filled 2021-03-02: qty 2
  Filled 2021-03-02 (×6): qty 1

## 2021-03-02 MED ORDER — MIDAZOLAM HCL 2 MG/2ML IJ SOLN: 2.0000 mg | Freq: Once | INTRAMUSCULAR | Status: AC

## 2021-03-02 MED ORDER — POTASSIUM CHLORIDE CRYS ER 20 MEQ PO TBCR: 20.0000 meq | EXTENDED_RELEASE_TABLET | Freq: Every day | ORAL | Status: DC | PRN

## 2021-03-02 MED ORDER — INSULIN GLARGINE-YFGN 100 UNIT/ML ~~LOC~~ SOLN
9.0000 [IU] | Freq: Two times a day (BID) | SUBCUTANEOUS | Status: DC
  Administered 2021-03-02 – 2021-03-06 (×8): 9 [IU] via SUBCUTANEOUS
  Filled 2021-03-02 (×12): qty 0.09

## 2021-03-02 MED ORDER — FLUTICASONE FUROATE-VILANTEROL 100-25 MCG/INH IN AEPB
1.0000 | INHALATION_SPRAY | Freq: Every day | RESPIRATORY_TRACT | Status: DC
  Filled 2021-03-02 (×2): qty 28

## 2021-03-02 MED ORDER — ALLOPURINOL 100 MG PO TABS
100.0000 mg | ORAL_TABLET | Freq: Every day | ORAL | Status: DC
  Administered 2021-03-03 – 2021-03-08 (×6): 100 mg via ORAL
  Filled 2021-03-02 (×6): qty 1

## 2021-03-02 SURGICAL SUPPLY — 38 items
BAG COUNTER SPONGE SURGICOUNT (BAG) IMPLANT
BLADE SAW RECIP 87.9 MT (BLADE) ×3 IMPLANT
BLADE SURG 21 STRL SS (BLADE) ×3 IMPLANT
BNDG COHESIVE 6X5 TAN STRL LF (GAUZE/BANDAGES/DRESSINGS) IMPLANT
CANISTER WOUND CARE 500ML ATS (WOUND CARE) ×3 IMPLANT
COVER SURGICAL LIGHT HANDLE (MISCELLANEOUS) ×3 IMPLANT
CUFF TOURN SGL QUICK 34 (TOURNIQUET CUFF) ×2
CUFF TRNQT CYL 34X4.125X (TOURNIQUET CUFF) ×2 IMPLANT
DRAPE DERMATAC (DRAPES) ×6 IMPLANT
DRAPE INCISE IOBAN 66X45 STRL (DRAPES) ×3 IMPLANT
DRAPE U-SHAPE 47X51 STRL (DRAPES) ×3 IMPLANT
DRESSING PREVENA PLUS CUSTOM (GAUZE/BANDAGES/DRESSINGS) ×2 IMPLANT
DRSG PREVENA PLUS CUSTOM (GAUZE/BANDAGES/DRESSINGS) ×3
DURAPREP 26ML APPLICATOR (WOUND CARE) ×3 IMPLANT
ELECT REM PT RETURN 9FT ADLT (ELECTROSURGICAL) ×3
ELECTRODE REM PT RTRN 9FT ADLT (ELECTROSURGICAL) ×2 IMPLANT
GLOVE SURG ORTHO LTX SZ9 (GLOVE) ×3 IMPLANT
GLOVE SURG UNDER POLY LF SZ9 (GLOVE) ×3 IMPLANT
GOWN STRL REUS W/ TWL XL LVL3 (GOWN DISPOSABLE) ×4 IMPLANT
GOWN STRL REUS W/TWL XL LVL3 (GOWN DISPOSABLE) ×6
KIT BASIN OR (CUSTOM PROCEDURE TRAY) ×3 IMPLANT
KIT TURNOVER KIT B (KITS) ×3 IMPLANT
MANIFOLD NEPTUNE II (INSTRUMENTS) ×3 IMPLANT
NS IRRIG 1000ML POUR BTL (IV SOLUTION) ×3 IMPLANT
PACK ORTHO EXTREMITY (CUSTOM PROCEDURE TRAY) ×3 IMPLANT
PAD ARMBOARD 7.5X6 YLW CONV (MISCELLANEOUS) ×3 IMPLANT
PREVENA RESTOR ARTHOFORM 33X30 (CANNISTER) ×3 IMPLANT
PREVENA RESTOR ARTHOFORM 46X30 (CANNISTER) IMPLANT
SPONGE T-LAP 18X18 ~~LOC~~+RFID (SPONGE) IMPLANT
STAPLER VISISTAT 35W (STAPLE) IMPLANT
STOCKINETTE IMPERVIOUS LG (DRAPES) ×3 IMPLANT
SUT ETHILON 2 0 PSLX (SUTURE) IMPLANT
SUT SILK 2 0 (SUTURE) ×2
SUT SILK 2-0 18XBRD TIE 12 (SUTURE) ×2 IMPLANT
SUT VIC AB 1 CTX 27 (SUTURE) ×6 IMPLANT
TOWEL GREEN STERILE (TOWEL DISPOSABLE) ×3 IMPLANT
TUBE CONNECTING 12X1/4 (SUCTIONS) ×3 IMPLANT
YANKAUER SUCT BULB TIP NO VENT (SUCTIONS) ×3 IMPLANT

## 2021-03-02 NOTE — Transfer of Care (Signed)
Immediate Anesthesia Transfer of Care Note  Patient: Richard [name]  Procedure(s) Performed: RIGHT BELOW KNEE AMPUTATION (Right: Knee) APPLICATION OF WOUND VAC  Patient Location: PACU  Anesthesia Type:MAC combined with regional for post-op pain  Level of Consciousness: awake, alert  and oriented  Airway & Oxygen Therapy: Patient Spontanous Breathing and Patient connected to face mask oxygen  Post-op Assessment: Report given to RN and Post -op Vital signs reviewed and stable  Post vital signs: Reviewed and stable  Last Vitals:  Vitals Value Taken Time  BP 109/70 03/02/21 1136  Temp    Pulse 90 03/02/21 1136  Resp 23 03/02/21 1136  SpO2 95 % 03/02/21 1136  Vitals shown include unvalidated device data.  Last Pain:  Vitals:   03/02/21 0915  TempSrc:   PainSc: 5       Patients Stated Pain Goal: 3 (99/69/24 9324)  Complications: No notable events documented.

## 2021-03-02 NOTE — Progress Notes (Signed)
Orthopedic Tech Progress Note Patient Details:  Richard [name] 27-May-1961 [id number]  Called in order to [facility] for an AMPUSHIELD BK WITH SHRINKER   Patient ID: Richard [name], male   DOB: 05-29-1961, 60 y.o.   MRN: [id number]  Richard [name] 03/02/2021, 11:13 AM

## 2021-03-02 NOTE — Anesthesia Procedure Notes (Signed)
Anesthesia Regional Block: Popliteal block   Pre-Anesthetic Checklist: , timeout performed,  Correct Patient, Correct Site, Correct Laterality,  Correct Procedure, Correct Position, site marked,  Risks and benefits discussed,  Pre-op evaluation,  At surgeon's request and post-op pain management  Laterality: Lower and Right  Prep: Maximum Sterile Barrier Precautions used, chloraprep       Needles:  Injection technique: Single-shot  Needle Type: Echogenic Needle     Needle Length: 9cm  Needle Gauge: 21     Additional Needles:   Procedures:,,,, ultrasound used (permanent image in chart),,    Narrative:  Start time: 03/02/2021 9:48 AM End time: 03/02/2021 9:53 AM Injection made incrementally with aspirations every 5 mL.  Performed by: Personally  Anesthesiologist: Trevor Iha, MD  Additional Notes: Block assessed. Patient tolerated procedure well.

## 2021-03-02 NOTE — Anesthesia Procedure Notes (Signed)
Anesthesia Regional Block: Adductor canal block   Pre-Anesthetic Checklist: , timeout performed,  Correct Patient, Correct Site, Correct Laterality,  Correct Procedure, Correct Position, site marked,  Risks and benefits discussed,  Surgical consent,  Pre-op evaluation,  At surgeon's request and post-op pain management  Laterality: Lower and Right  Prep: chloraprep       Needles:  Injection technique: Single-shot  Needle Type: Echogenic Needle     Needle Length: 9cm  Needle Gauge: 22     Additional Needles:   Procedures:,,,, ultrasound used (permanent image in chart),,    Narrative:  Start time: 03/02/2021 9:54 AM End time: 03/02/2021 9:57 AM Injection made incrementally with aspirations every 5 mL.  Performed by: Personally  Anesthesiologist: Trevor Iha, MD  Additional Notes: Block assessed prior to surgery. Pt tolerated procedure well.

## 2021-03-02 NOTE — H&P (Signed)
Richard [name] is an 60 y.o. male.   Chief Complaint: right foot wound dehiscence HPI: Patient is a 60 year old gentleman who is seen in follow-up status post right lower extremity revascularization and right first and second ray amputation. Patient is 4 weeks status post left transtibial amputation and 3 weeks status post right first and second ray amputation. Patient is approximately 4 weeks status post revascularization.  Past Medical History:  Diagnosis Date   AICD (automatic cardioverter/defibrillator) present 01/11/2017   Anxiety    CHF (congestive heart failure) (HCC)    CKD (chronic kidney disease)    COPD (chronic obstructive pulmonary disease) (HCC)    never smoker, industrial exposure   Diabetes mellitus with diabetic neuropathy, with long-term current use of insulin (HCC)    GERD (gastroesophageal reflux disease)    High cholesterol    History of gout    "take RX qd" (11/01/2017)   Hypertension    Nonobstructive atherosclerosis of coronary artery    On home oxygen therapy    "2L prn" (11/01/2017)   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HCC)    LLL, present for years   Systolic HF (heart failure) (HCC)     Past Surgical History:  Procedure Laterality Date   ABDOMINAL AORTOGRAM W/LOWER EXTREMITY N/A 02/01/2021   Procedure: ABDOMINAL AORTOGRAM W/LOWER EXTREMITY;  Surgeon: Nada Libman, MD;  Location: [facility] INVASIVE CV LAB;  Service: Cardiovascular;  Laterality: N/A;   AMPUTATION Left 01/21/2021   Procedure: AMPUTATION BELOW KNEE;  Surgeon: Nadara Mustard, MD;  Location: [facility] OR;  Service: Orthopedics;  Laterality: Left;   AMPUTATION Right 02/04/2021   Procedure: RIGHT FOOT 1ST AND 2ND RAY AMPUTATION;  Surgeon: Nadara Mustard, MD;  Location: [facility] OR;  Service: Orthopedics;  Laterality: Right;   APPLICATION OF WOUND VAC Left 01/21/2021   Procedure: APPLICATION OF WOUND VAC;  Surgeon: Nadara Mustard, MD;  Location: [facility] OR;  Service: Orthopedics;  Laterality: Left;   APPLICATION OF WOUND VAC  02/04/2021    Procedure: APPLICATION OF WOUND VAC;  Surgeon: Nadara Mustard, MD;  Location: [facility] OR;  Service: Orthopedics;;   CATARACT EXTRACTION W/ INTRAOCULAR LENS IMPLANTW/ TRABECULECTOMY Left ~ 2013   "may have done this when I had my other eye OR"   CIRCUMCISION     CORONARY STENT INTERVENTION N/A 11/02/2017   Procedure: CORONARY STENT INTERVENTION;  Surgeon: Laurey Morale, MD;  Location: [facility] INVASIVE CV LAB;  Service: Cardiovascular;  Laterality: N/A;   CORONARY STENT INTERVENTION N/A 11/02/2017   Procedure: CORONARY STENT INTERVENTION;  Surgeon: Lyn Records, MD;  Location: [facility] INVASIVE CV LAB;  Service: Cardiovascular;  Laterality: N/A;   EYE SURGERY Left ~ 2013   "fell on something in basement; stuck in my eye"   ICD IMPLANT     Medtronic Dual Chamber 01/11/17   MULTIPLE TOOTH EXTRACTIONS     "pulled all my top teeth"   PERIPHERAL VASCULAR BALLOON ANGIOPLASTY  02/01/2021   Procedure: PERIPHERAL VASCULAR BALLOON ANGIOPLASTY;  Surgeon: Nada Libman, MD;  Location: [facility] INV[facility]IVE CV LAB;  Service: Cardiovascular;;  TP Trunk / PT   RIGHT/LEFT HEART CATH AND CORONARY ANGIOGRAPHY N/A 11/02/2017   Procedure: RIGHT/LEFT HEART CATH AND CORONARY ANGIOGRAPHY;  Surgeon: Laurey Morale, MD;  Location: [facility] INVASIVE CV LAB;  Service: Cardiovascular;  Laterality: N/A;   TEE WITHOUT CARDIOVERSION N/A 01/20/2021   Procedure: TRANSESOPHAGEAL ECHOCARDIOGRAM (TEE);  Surgeon: Laurey Morale, MD;  Location: [facility] ENDOSCOPY;  Service: Cardiovascular;  Laterality: N/A;  Family History  Problem Relation Age of Onset   Hypertension Mother    Diabetes Mother    Hypertension Father    Diabetes Father    Diabetes Sister    Diabetes Brother    Social History:  reports that he has quit smoking. His smoking use included cigarettes. He has never used smokeless tobacco. He reports that he does not currently use alcohol. He reports that he does not use drugs.  Allergies:  Allergies  Allergen Reactions   Aspirin Itching    Codeine Hives   Coconut Oil Itching    No medications prior to admission.    No results found for this or any previous visit (from the past 48 hour(s)). CUP PACEART REMOTE DEVICE CHECK  Result Date: 03/01/2021 Scheduled remote reviewed. Normal device function.  Optivol crossed threshold 8/14, ongoing, triage HF "high" Route to triage, followed by HF Next remote 91 days. LR 03/01/21 see note in epic. PEP   Review of Systems  All other systems reviewed and are negative.  Height 6\' 5"  (1.956 m), weight 108.9 kg. Physical Exam  Patient is alert, oriented, no adenopathy, well-dressed, normal affect, normal respiratory effort. Examination patient has a palpable posterior tibial pulse.  Review of the wound shows progressive gangrenous necrotic wound with exposed bone.  [name] foot is thin and atrophic.  There is no ascending cellulitis.  The wound is open and decompressed.  The calf is soft nontender to palpation no crepitation.Heart RRR Lungs Clear Assessment/Plan Patient has inline arterial flow to the ankle and has progressive gangrenous changes of the right first and second ray amputation.  Patient does not have further foot salvage intervention options and will plan for transtibial amputation on the right next Wednesday.  Saturday Richard Scherzer, PA 03/02/2021, 5:51 AM

## 2021-03-02 NOTE — Anesthesia Postprocedure Evaluation (Signed)
Anesthesia Post Note  Patient: Richard [name]  Procedure(s) Performed: RIGHT BELOW KNEE AMPUTATION (Right: Knee) APPLICATION OF WOUND VAC     Patient location during evaluation: PACU Anesthesia Type: Regional Level of consciousness: awake and alert Pain management: pain level controlled Vital Signs Assessment: post-procedure vital signs reviewed and stable Respiratory status: spontaneous breathing, nonlabored ventilation, respiratory function stable and patient connected to nasal cannula oxygen Cardiovascular status: stable and blood pressure returned to baseline Postop Assessment: no apparent nausea or vomiting Anesthetic complications: no   No notable events documented.  Last Vitals:  Vitals:   03/02/21 1436 03/02/21 1506  BP: 113/85 111/80  Pulse: 79 92  Resp: 18 17  Temp:    SpO2: 96% 100%    Last Pain:  Vitals:   03/02/21 1506  TempSrc:   PainSc: 0-No pain                 Trevor Iha

## 2021-03-02 NOTE — Op Note (Signed)
   Date of Surgery: 03/02/2021  INDICATIONS: Richard [name] is a 60 y.o.-year-old male who is status post foot salvage intervention on the right with a first and second ray amputation.  Patient has had progressive wound dehiscence with exposed bone necrotic tissue and presents at this time for transtibial amputation.Marland Kitchen  PREOPERATIVE DIAGNOSIS: Osteomyelitis ulceration and wound dehiscence right foot status post first and second ray amputation  POSTOPERATIVE DIAGNOSIS: Same.  PROCEDURE: Transtibial amputation Application of Prevena wound VAC  SURGEON: Lajoyce Corners, M.D.  ANESTHESIA:  general  IV FLUIDS AND URINE: See anesthesia records.  ESTIMATED BLOOD LOSS: See anesthesia records.  COMPLICATIONS: None.  DESCRIPTION OF PROCEDURE: The patient was brought to the operating room after undergoing regional anesthetic. After adequate levels of anesthesia were obtained patient's lower extremity was prepped using DuraPrep draped into a sterile field. A timeout was called. The foot was draped out of the sterile field with impervious stockinette. A transverse incision was made 11 cm distal to the tibial tubercle. This curved proximally and a large posterior flap was created. The tibia was transected 1 cm proximal to the skin incision. The fibula was transected just proximal to the tibial incision. The tibia was beveled anteriorly. A large posterior flap was created. The sciatic nerve was pulled cut and allowed to retract. The vascular bundles were suture ligated with 2-0 silk. The deep and superficial fascial layers were closed using #1 Vicryl. The skin was closed using staples and 2-0 nylon. The wound was covered with a Prevena customizable and arthroform wound VAC.  The dressing was sealed with dermatac there was a good suction fit. A prosthetic shrinker and limb protector were applied. Patient was taken to the PACU in stable condition.   DISCHARGE PLANNING:  Antibiotic duration: 24 hours  Weightbearing:  Nonweightbearing on the operative extremity  Pain medication: Opioid pathway  Dressing care/ Wound VAC: Continue wound VAC for 1 week after discharge  Discharge to: Discharge planning based on therapy's recommendations for possible inpatient rehabilitation, outpatient rehabilitation, or discharge to home with therapy  Follow-up: In the office 1 week post operative.  Richard Baker, MD [facility] 11:36 AM

## 2021-03-03 ENCOUNTER — Encounter (HOSPITAL_COMMUNITY): Payer: Self-pay | Admitting: Orthopedic Surgery

## 2021-03-03 LAB — BASIC METABOLIC PANEL
Anion gap: 7 (ref 5–15)
BUN: 20 mg/dL (ref 6–20)
CO2: 28 mmol/L (ref 22–32)
Calcium: 8.5 mg/dL — ABNORMAL LOW (ref 8.9–10.3)
Chloride: 98 mmol/L (ref 98–111)
Creatinine, Ser: 0.92 mg/dL (ref 0.61–1.24)
GFR, Estimated: >60 mL/min (ref >60)
Glucose, Bld: 209 mg/dL — ABNORMAL HIGH (ref 70–99)
Potassium: 4.9 mmol/L (ref 3.5–5.1)
Sodium: 133 mmol/L — ABNORMAL LOW (ref 135–145)

## 2021-03-03 LAB — GLUCOSE, CAPILLARY
Glucose-Capillary: 279 mg/dL — ABNORMAL HIGH (ref 70–99)
Glucose-Capillary: 186 mg/dL — ABNORMAL HIGH (ref 70–99)
Glucose-Capillary: 150 mg/dL — ABNORMAL HIGH (ref 70–99)
Glucose-Capillary: 124 mg/dL — ABNORMAL HIGH (ref 70–99)

## 2021-03-03 LAB — CBC
HCT: 30.2 % — ABNORMAL LOW (ref 39.0–52.0)
Hemoglobin: 9.2 g/dL — ABNORMAL LOW (ref 13.0–17.0)
MCH: 28 pg (ref 26.0–34.0)
MCHC: 30.5 g/dL (ref 30.0–36.0)
MCV: 91.8 fL (ref 80.0–100.0)
Platelets: 470 10*3/uL — ABNORMAL HIGH (ref 150–400)
RBC: 3.29 MIL/uL — ABNORMAL LOW (ref 4.22–5.81)
RDW: 19.5 % — ABNORMAL HIGH (ref 11.5–15.5)
WBC: 15.1 10*3/uL — ABNORMAL HIGH (ref 4.0–10.5)
nRBC: 0 % (ref 0.0–0.2)

## 2021-03-03 NOTE — Progress Notes (Signed)
Inpatient Diabetes Program Recommendations  AACE/ADA: New Consensus Statement on Inpatient Glycemic Control (2015)  Target Ranges:  Prepandial:   less than 140 mg/dL      Peak postprandial:   less than 180 mg/dL (1-2 hours)      Critically ill patients:  140 - 180 mg/dL   Lab Results  Component Value Date   GLUCAP 279 (H) 03/03/2021   HGBA1C 10.3 (H) 01/17/2021    Review of Glycemic Control Results for Richard [name], BETHEL (MRN [id number]) as of 03/03/2021 10:57  Ref. Range 03/02/2021 11:44 03/02/2021 23:44 03/03/2021 07:48  Glucose-Capillary Latest Ref Range: 70 - 99 mg/dL 299 (H) 242 (H) 683 (H)   Diabetes history: DM 2 Outpatient Diabetes medications:  Jardiance 10 mg daily, Semglee 13 units bid Current orders for Inpatient glycemic control:  Jardiance 10 mg daily Novolog moderate tid with meals Semglee 9 units bid  Inpatient Diabetes Program Recommendations:    Please consider increasing Semglee to 12 units bid and add Novolog meal coverage 3 units tid with meals (hold if patient eats less than 50% or NPO). Diabetes Coordinator spoke with patient in July regarding A1C and medications.  Will follow.  Thanks,  Beryl Meager, RN, BC-ADM Inpatient Diabetes Coordinator Pager [phone]  (8a-5p)

## 2021-03-03 NOTE — Progress Notes (Signed)
Patient is postop day 1 status post right below-knee amputation.  He is lying in bed responds appropriately.   Vital signs stable afebrile limb protectors are in the room.  H&H this morning is 9.2 and 30.2.   Patient will work with therapy today disposition to CIR or skilled nursing

## 2021-03-03 NOTE — Progress Notes (Signed)
Inpatient Rehab Admissions Coordinator:   Note therapy recommending home health f/u at this time. CIR will sign off.   Estill Dooms, PT, DPT Admissions Coordinator [phone] 03/03/21  3:10 PM

## 2021-03-03 NOTE — Evaluation (Signed)
Occupational Therapy Evaluation Patient Details Name: Richard [name] MRN: [id number] DOB: March 23, 1961 Today's Date: 03/03/2021    History of Present Illness Richard [name] is a 60 y.o.-year-old male who is status post foot salvage intervention on the right with a first and second ray amputation.  Patient has had progressive wound dehiscence with exposed bone necrotic tissue. Pt is now s/p R Transtibial amputation. PMH includesL R L BKA 01/21/21, CHF, COPD   Clinical Impression   Pt admitted with the above diagnoses and presents with below problem list. Pt will benefit from continued acute OT to address the below listed deficits and maximize independence with basic ADLs. Pt able to complete bed mobility and lateral scoots to drop arm recliner, cues needed for safety. Pt limited by onset of nausea and feeling poorly. NT and RN to room, assessed vitals with O2 on RA in the 70s (terminal 67). SpO2 was not improving significantly with time and breathing exercises so applied 2L O2 via [place] with sats recovering into low 90s. Pt left resting in recliner, head reclined and LB rest elevated. Pt currently needs up to min A with LB ADLs.      Follow Up Recommendations  Supervision/Assistance - 24 hour;Home health OT    Equipment Recommendations  3 in 1 bedside commode (drop arm)    Recommendations for Other Services       Precautions / Restrictions Precautions Precautions: Fall Precaution Comments: Recent 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); R foot TDWB if needed through heel transfers Required Braces or Orthoses: Other Brace Other Brace: R limb guard Restrictions Weight Bearing Restrictions: Yes RLE Weight Bearing: Non weight bearing LLE Weight Bearing: Non weight bearing      Mobility Bed Mobility Overal bed mobility: Needs Assistance Bed Mobility: Supine to Sit     Supine to sit: Min assist     General bed mobility comments: patient not feeling well, requires a lot of encouragement to participate this day.     Transfers Overall transfer level: Needs assistance Equipment used: None Transfers: Lateral/Scoot Transfers          Lateral/Scoot Transfers: Min guard General transfer comment: EOB to recliner. Cues for safety during lateral scoots for technique and positioning    Balance Overall balance assessment: Needs assistance Sitting-balance support: Bilateral upper extremity supported Sitting balance-Leahy Scale: Fair                                     ADL either performed or assessed with clinical judgement   ADL                                               Vision         Perception     Praxis      Pertinent Vitals/Pain Pain Assessment: Faces Faces Pain Scale: Hurts whole lot Pain Location: R leg. phantom limb pain Pain Descriptors / Indicators: Other (Comment);Grimacing;Guarding;Moaning (patient reports R LE phantom itching) Pain Intervention(s): Limited activity within patient's tolerance;Repositioned     Hand Dominance Right   Extremity/Trunk Assessment Upper Extremity Assessment Upper Extremity Assessment: Overall WFL for tasks assessed   Lower Extremity Assessment Lower Extremity Assessment: Defer to PT evaluation RLE Coordination: decreased gross motor LLE Coordination: decreased gross motor       Communication Communication Communication: No  difficulties;Other (comment) (difficult to understand at times, garbled/mumbling speech)   Cognition Arousal/Alertness: Awake/alert Behavior During Therapy: Flat affect Overall Cognitive Status: Within Functional Limits for tasks assessed                                 General Comments: internally distracted by pain   General Comments       Exercises     Shoulder Instructions      Home Living Family/patient expects to be discharged to:: Private residence Living Arrangements: Spouse/significant other;Children Available Help at Discharge: Family;Available  PRN/intermittently Type of Home: House Home Access: Stairs to enter [employer] of Steps: says they are supposed to build ramp Entrance Stairs-Rails: Can reach both Home Layout: Two level;Able to live on main level with bedroom/bathroom;Full bath on main level     Bathroom Shower/Tub: Tub/shower unit;Walk-in shower   Bathroom Toilet: Standard Bathroom Accessibility: Yes How Accessible: Accessible via walker Home Equipment: Cane - single point;Wheelchair - manual   Additional Comments: Patient states his children can help him get in house by picking him up once he scoots up the steps on his bottom. Will likely need transport home if no ramp built.  Lives With: Spouse;Son;Daughter    Prior Functioning/Environment Level of Independence: Independent with assistive device(s)        Comments: patient had been in CIR after L BKA. Has wheelchair        OT Problem List: Decreased strength;Decreased activity tolerance;Impaired balance (sitting and/or standing);Decreased knowledge of use of DME or AE;Decreased knowledge of precautions;Cardiopulmonary status limiting activity;Pain      OT Treatment/Interventions: Self-care/ADL training;Therapeutic exercise;Energy conservation;DME and/or AE instruction;Therapeutic activities;Patient/family education;Balance training    OT Goals(Current goals can be found in the care plan section) Acute Rehab OT Goals Patient Stated Goal: to go home OT Goal Formulation: With patient Time For Goal Achievement: 03/17/21 Potential to Achieve Goals: Good ADL Goals Pt Will Perform Grooming: Independently;sitting Pt Will Perform Lower Body Bathing: with set-up;sitting/lateral leans Pt Will Perform Lower Body Dressing: sitting/lateral leans;with supervision Pt Will Transfer to Toilet: with supervision;bedside commode Pt Will Perform Toileting - Clothing Manipulation and hygiene: with supervision;sitting/lateral leans  OT Frequency: Min 2X/week    Barriers to D/C: Decreased caregiver support  unclear how much family can assist at home       Co-evaluation PT/OT/SLP Co-Evaluation/Treatment: Yes Reason for Co-Treatment: For patient/therapist safety;To address functional/ADL transfers PT goals addressed during session: Mobility/safety with mobility;Balance OT goals addressed during session: ADL's and self-care      AM-PAC OT "6 Clicks" Daily Activity     Outcome Measure Help from another person eating meals?: None Help from another person taking care of personal grooming?: None Help from another person toileting, which includes using toliet, bedpan, or urinal?: A Little Help from another person bathing (including washing, rinsing, drying)?: A Little Help from another person to put on and taking off regular upper body clothing?: None Help from another person to put on and taking off regular lower body clothing?: A Little 6 Click Score: 21   End of Session Equipment Utilized During Treatment: Oxygen (2L applied post lateral scoot trasnfer to recliner) Nurse Communication: Other (comment) (see general comment)  Activity Tolerance: Other (comment) (onset of nausea, feeling poorly, feeling flushed post-lateral scoot transfer. Vitals assessed. NT and RN in room assessing pt.) Patient left: in chair;with call bell/phone within reach;Other (comment)  OT Visit Diagnosis: Muscle weakness (generalized) (M62.81);Other abnormalities  of gait and mobility (R26.89);Pain                Time: 1311-1335 OT Time Calculation (min): 24 min Charges:  OT General Charges $OT Visit: 1 Visit OT Evaluation $OT Eval Moderate Complexity: 1 Mod  Raynald Kemp, OT Acute Rehabilitation Services Pager: [phone] Office: [phone]   Pilar Grammes 03/03/2021, 2:49 PM

## 2021-03-03 NOTE — Progress Notes (Signed)
PT Cancellation Note  Patient Details Name: Richard [name] MRN: [id number] DOB: [birth year]   Cancelled Treatment:    Reason Eval/Treat Not Completed: Patient declined, no reason specified. Patient uninterested in participating in PT this morning. Will re-attempt this pm.    Eudora Guevarra 03/03/2021, 11:21 AM

## 2021-03-03 NOTE — Evaluation (Signed)
Physical Therapy Evaluation Patient Details Name: Richard [name] MRN: [id number] DOB: [birth year] Today's Date: 03/03/2021   History of Present Illness  Richard [name] is a 60 y.o.-year-old male who is status post foot salvage intervention on the right with a first and second ray amputation.  Patient has had progressive wound dehiscence with exposed bone necrotic tissue and presents at this time for transtibial amputation.Marland Kitchen PMH includesL R L BKA 01/21/21, CHF, COPD  Clinical Impression  Patient is not feeling well and reluctant to participate. Requires encouragement. He required min assist for supine to sit. Min guard for lateral scoot to recliner from bed. Patient feeling nauseated and having phantom pain/itching in R LE. RN notified. He will continue to benefit from skilled PT while here to improve mobility and safety for return home.        Follow Up Recommendations Home health PT    Equipment Recommendations  None recommended by PT    Recommendations for Other Services       Precautions / Restrictions Precautions Precautions: Fall Other Brace: R limb guard Restrictions Weight Bearing Restrictions: Yes RLE Weight Bearing: Non weight bearing LLE Weight Bearing: Non weight bearing      Mobility  Bed Mobility Overal bed mobility: Needs Assistance Bed Mobility: Supine to Sit     Supine to sit: Min assist     General bed mobility comments: patient not feeling well, requires a lot of encouragement to participate this day.    Transfers Overall transfer level: Needs assistance Equipment used: None Transfers: Lateral/Scoot Transfers          Lateral/Scoot Transfers: Supervision;Min guard    Ambulation/Gait             General Gait Details: non-ambulatory  Stairs            Wheelchair Mobility    Modified Rankin (Stroke Patients Only)       Balance Overall balance assessment: Needs assistance Sitting-balance support: Bilateral upper extremity  supported Sitting balance-Leahy Scale: Fair                                       Pertinent Vitals/Pain Pain Assessment: Faces Faces Pain Scale: Hurts even more Pain Location: R leg Pain Descriptors / Indicators: Other (Comment);Grimacing;Guarding;Moaning (patient reports R LE phantom itching) Pain Intervention(s): Limited activity within patient's tolerance;Repositioned    Home Living Family/patient expects to be discharged to:: Private residence Living Arrangements: Spouse/significant other;Children Available Help at Discharge: Family;Available PRN/intermittently Type of Home: House Home Access: Stairs to enter Entrance Stairs-Rails: Can reach both Entrance Stairs-Number of Steps: says they are supposed to build ramp Home Layout: Two level;Able to live on main level with bedroom/bathroom;Full bath on main level Home Equipment: Cane - single point;Wheelchair - manual Additional Comments: Patient states his children can help him get in house by picking him up once he scoots up the steps on his bottom. Will likely need transport home if no ramp built.    Prior Function           Comments: patient had been in CIR after L BKA. Has wheelchair     Hand Dominance   Dominant Hand: Right    Extremity/Trunk Assessment   Upper Extremity Assessment Upper Extremity Assessment: Overall WFL for tasks assessed    Lower Extremity Assessment Lower Extremity Assessment: RLE deficits/detail;LLE deficits/detail RLE Coordination: decreased gross motor LLE Coordination: decreased gross motor  Communication   Communication: No difficulties;Other (comment) (difficult to understand at times, garbled/mumbling speech)  Cognition Arousal/Alertness: Awake/alert Behavior During Therapy: Flat affect Overall Cognitive Status: Within Functional Limits for tasks assessed                                        General Comments      Exercises      Assessment/Plan    PT Assessment Patient needs continued PT services  PT Problem List Decreased strength;Decreased mobility;Decreased activity tolerance;Pain;Decreased safety awareness;Decreased balance       PT Treatment Interventions Therapeutic exercise;Functional mobility training;Therapeutic activities;Patient/family education    PT Goals (Current goals can be found in the Care Plan section)  Acute Rehab PT Goals Patient Stated Goal: to go home PT Goal Formulation: With patient Time For Goal Achievement: 03/17/21 Potential to Achieve Goals: Good    Frequency Min 3X/week   Barriers to discharge Inaccessible home environment steps to enter home currently    Co-evaluation PT/OT/SLP Co-Evaluation/Treatment: Yes Reason for Co-Treatment: For patient/therapist safety;Necessary to address cognition/behavior during functional activity;To address functional/ADL transfers PT goals addressed during session: Mobility/safety with mobility;Balance         AM-PAC PT "6 Clicks" Mobility  Outcome Measure Help needed turning from your back to your side while in a flat bed without using bedrails?: A Little Help needed moving from lying on your back to sitting on the side of a flat bed without using bedrails?: A Little Help needed moving to and from a bed to a chair (including a wheelchair)?: A Little Help needed standing up from a chair using your arms (e.g., wheelchair or bedside chair)?: Total Help needed to walk in hospital room?: Total Help needed climbing 3-5 steps with a railing? : Total 6 Click Score: 12    End of Session   Activity Tolerance: Patient limited by pain;Other (comment) (nausea) Patient left: in chair;with call bell/phone within reach Nurse Communication: Mobility status PT Visit Diagnosis: Other abnormalities of gait and mobility (R26.89);Pain Pain - Right/Left: Right Pain - part of body: Leg    Time: 1315-1340 PT Time Calculation (min) (ACUTE ONLY): 25  min   Charges:   PT Evaluation $PT Eval Moderate Complexity: 1 Mod          Tzippy Testerman, PT, GCS 03/03/21,1:54 PM

## 2021-03-04 DIAGNOSIS — Z9862 Peripheral vascular angioplasty status: Secondary | ICD-10-CM

## 2021-03-04 DIAGNOSIS — Z89511 Acquired absence of right leg below knee: Secondary | ICD-10-CM

## 2021-03-04 DIAGNOSIS — Z794 Long term (current) use of insulin: Secondary | ICD-10-CM

## 2021-03-04 DIAGNOSIS — E1151 Type 2 diabetes mellitus with diabetic peripheral angiopathy without gangrene: Secondary | ICD-10-CM

## 2021-03-04 DIAGNOSIS — M79604 Pain in right leg: Secondary | ICD-10-CM

## 2021-03-04 DIAGNOSIS — Z87891 Personal history of nicotine dependence: Secondary | ICD-10-CM

## 2021-03-04 DIAGNOSIS — Z79899 Other long term (current) drug therapy: Secondary | ICD-10-CM

## 2021-03-04 LAB — CBC
HCT: 27 % — ABNORMAL LOW (ref 39.0–52.0)
Hemoglobin: 8.3 g/dL — ABNORMAL LOW (ref 13.0–17.0)
MCH: 28.4 pg (ref 26.0–34.0)
MCHC: 30.7 g/dL (ref 30.0–36.0)
MCV: 92.5 fL (ref 80.0–100.0)
Platelets: 482 10*3/uL — ABNORMAL HIGH (ref 150–400)
RBC: 2.92 MIL/uL — ABNORMAL LOW (ref 4.22–5.81)
RDW: 20.2 % — ABNORMAL HIGH (ref 11.5–15.5)
WBC: 15.1 10*3/uL — ABNORMAL HIGH (ref 4.0–10.5)
nRBC: 0.1 % (ref 0.0–0.2)

## 2021-03-04 LAB — GLUCOSE, CAPILLARY
Glucose-Capillary: 103 mg/dL — ABNORMAL HIGH (ref 70–99)
Glucose-Capillary: 63 mg/dL — ABNORMAL LOW (ref 70–99)
Glucose-Capillary: 68 mg/dL — ABNORMAL LOW (ref 70–99)
Glucose-Capillary: 115 mg/dL — ABNORMAL HIGH (ref 70–99)
Glucose-Capillary: 158 mg/dL — ABNORMAL HIGH (ref 70–99)
Glucose-Capillary: 155 mg/dL — ABNORMAL HIGH (ref 70–99)

## 2021-03-04 LAB — BASIC METABOLIC PANEL
Anion gap: 11 (ref 5–15)
BUN: 26 mg/dL — ABNORMAL HIGH (ref 6–20)
CO2: 30 mmol/L (ref 22–32)
Calcium: 8.6 mg/dL — ABNORMAL LOW (ref 8.9–10.3)
Chloride: 96 mmol/L — ABNORMAL LOW (ref 98–111)
Creatinine, Ser: 1.13 mg/dL (ref 0.61–1.24)
GFR, Estimated: >60 mL/min (ref >60)
Glucose, Bld: 87 mg/dL (ref 70–99)
Potassium: 4.5 mmol/L (ref 3.5–5.1)
Sodium: 137 mmol/L (ref 135–145)

## 2021-03-04 LAB — SURGICAL PATHOLOGY

## 2021-03-04 NOTE — Progress Notes (Signed)
Inpatient Diabetes Program Recommendations  AACE/ADA: New Consensus Statement on Inpatient Glycemic Control (2015)  Target Ranges:  Prepandial:   less than 140 mg/dL      Peak postprandial:   less than 180 mg/dL (1-2 hours)      Critically ill patients:  140 - 180 mg/dL   Lab Results  Component Value Date   GLUCAP 103 (H) 03/04/2021   HGBA1C 10.3 (H) 01/17/2021    Review of Glycemic Control Results for ACHILLES, NEVILLE (MRN [id number]) as of 03/04/2021 10:34  Ref. Range 03/04/2021 07:58 03/04/2021 07:59 03/04/2021 08:16 03/04/2021 08:20  Glucose-Capillary Latest Ref Range: 70 - 99 mg/dL 68 (L) 63 (L) 44 (LL) 671 (H)    Diabetes history: DM 2 Outpatient Diabetes medications:  Jardiance 10 mg daily, Semglee 13 units bid Current orders for Inpatient glycemic control:  Jardiance 10 mg daily Novolog moderate tid with meals Semglee 9 units bid  Inpatient Diabetes Program Recommendations:    Hypoglycemia this am. -  reduce Semglee to 8 units bid  Thanks,  Christena Deem RN, MSN, BC-ADM Inpatient Diabetes Coordinator Team Pager [phone] (8a-5p)

## 2021-03-04 NOTE — Progress Notes (Signed)
Day 2 status post below-knee amputation.  [name] biggest complaint is ongoing pain in the calf of his leg above the amputation site.  He states that this has been present ever since he had his vascular procedure.  He did also had mentioned this in the office prior to his below-knee amputation.   Wound vacs working well with 0 cc in the canister.  Tracing the pain is from posterior part of the lower leg above the amputation site up into the thigh.  Patient thinks this is secondary to his vascular procedure I am not sure but he was complaining of this prior to the amputation in the office.  He does not know when he has a follow-up with vascular will contact vascular for consult today.  Plan home health

## 2021-03-04 NOTE — Progress Notes (Signed)
OT Cancellation Note  Patient Details Name: Richard [name] MRN: [id number] DOB: 1960-07-12   Cancelled Treatment:    Reason Eval/Treat Not Completed: Patient declined reporting 9/10 pain in R residual limb (declined pain meds from RN prior to this therapists entrance reporting wanting to have lunch first) and low blood sugar "in the 40's". Most recent blood sugar in Epic noted to be 104mg /dl at . OT to check back as time allows.   5615 OTR/L Supplemental OT, Department of rehab services [phone]  Amiracle Neises R H. 03/04/2021, 11:20 AM

## 2021-03-04 NOTE — TOC Initial Note (Addendum)
Transition of Care [facility]) - Initial/Assessment Note    Patient Details  Name: Richard [name] MRN: [id number] Date of Birth: May 10, 1961  [facility] CM/SW Contact:    Kingsley Plan, RN Phone Number: 03/04/2021, 10:19 AM  Clinical Narrative:                 Spoke to patient at bedside. Comfirmed face sheet information. Patient states he lives with 2 daughters, and wife. Patient active with [facility] for HHRN,PT,OT. Confirmed with Pearson Grippe with [facility], will need resumption of care orders.   Patient has a wheel chair and tub bench at home. He wants a different kind of tub bench. NCM called PT/OT office spoke to [facility] , she will see if OT can see and make recommendations.   NCM also suggested home health PT/OT can make recommendations. NCM mentioned same to [facility] with [facility].   Has home oxygen PRN  Expected Discharge Plan: Home w Home Health Services     Patient Goals and CMS Choice Patient states their goals for this hospitalization and ongoing recovery are:: to go home CMS Medicare.gov Compare Post Acute Care list provided to:: Patient Choice offered to / list presented to : Patient  Expected Discharge Plan and Services Expected Discharge Plan: Home w Home Health Services   Discharge Planning Services: CM Consult Post Acute Care Choice: Home Health Living arrangements for the past 2 months: Single Family Home                           HH Arranged: PT, RN, OT [facility] Agency: [facility] Date HH Agency Contacted: 03/04/21 Time HH Agency Contacted: [phone] Representative spoke with at [facility] Agency: Pearson Grippe  Prior Living Arrangements/Services Living arrangements for the past 2 months: Single Family Home Lives with:: Spouse, Relatives Patient language and need for interpreter reviewed:: Yes Do you feel safe going back to the place where you live?: Yes      Need for Family Participation in Patient Care: Yes (Comment) Care  giver support system in place?: Yes (comment) Current home services: DME Criminal Activity/Legal Involvement Pertinent to Current Situation/Hospitalization: No - Comment as needed  Activities of Daily Living Home Assistive Devices/Equipment: CBG Meter, Dentures (specify type), Blood pressure cuff, Wheelchair, Shower chair with back (upper dentures) ADL Screening (condition at time of admission) [name] cognitive ability adequate to safely complete daily activities?: Yes Is the patient deaf or have difficulty hearing?: No Does the patient have difficulty seeing, even when wearing glasses/contacts?: No Does the patient have difficulty concentrating, remembering, or making decisions?: No Patient able to express need for assistance with ADLs?: Yes Does the patient have difficulty dressing or bathing?: Yes Independently performs ADLs?: Yes (appropriate for developmental age) Weakness of Legs: Both Weakness of Arms/Hands: None  Permission Sought/Granted   Permission granted to share information with : No              Emotional Assessment Appearance:: Appears stated age Attitude/Demeanor/Rapport: Engaged Affect (typically observed): Accepting Orientation: : Oriented to Self, Oriented to Place, Oriented to  Time, Oriented to Situation Alcohol / Substance Use: Not Applicable Psych Involvement: No (comment)  Admission diagnosis:  Wound dehiscence [T81.30XA] Patient Active Problem List   Diagnosis Date Noted   Wound dehiscence 03/02/2021   Subacute osteomyelitis, right ankle and foot (HCC)    Sleep disturbance    Essential hypertension    Diabetic peripheral neuropathy (HCC)  Labile blood glucose    Left below-knee amputee (HCC) 02/07/2021   PAD (peripheral artery disease) (HCC) 02/01/2021   Below-knee amputation of left lower extremity (HCC) 01/27/2021   S/P BKA (below knee amputation), left (HCC)    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HCC)    SOB (shortness of breath)     Open wound    Bacteremia    Chronic ulcer of left ankle (HCC)    Severe sepsis without septic shock (HCC)    COPD exacerbation (HCC) 01/15/2021   Chronic systolic CHF (congestive heart failure) (HCC) 01/15/2021   Acute on chronic respiratory failure with hypoxia and hypercapnia (HCC) 01/15/2021   Acute renal failure superimposed on stage 3 chronic kidney disease (HCC) 01/15/2021   Elevated troponin 01/15/2021   Hyponatremia 01/15/2021   CHF exacerbation (HCC) 10/17/2020   Acute on chronic systolic CHF (congestive heart failure) (HCC) 10/16/2020   Acute on chronic combined systolic and diastolic CHF (congestive heart failure) (HCC) 03/15/2018   S/P right and left heart catheterization 11/02/2017   Panic attacks 11/02/2017   Non-STEMI (non-ST elevated myocardial infarction) (HCC)    COPD (chronic obstructive pulmonary disease) (HCC)    Hypertension    CKD (chronic kidney disease)    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HCC)    Presence of implantable cardioverter-defibrillator (ICD)    GERD (gastroesophageal reflux disease)    Diabetes mellitus with diabetic neuropathy, with long-term current use of insulin (HCC)    PCP:  Ananias Pilgrim, MD Pharmacy:   [facility] - [place], [place] - [address] [facility] [address] [place] [address] Phone: [phone] Fax: [phone]  [facility] [address] [place] [address] Phone: [phone] Fax: [phone]     Social Determinants of Health (SDOH) Interventions    Readmission Risk Interventions Readmission Risk Prevention Plan 02/07/2021  Transportation Screening Complete  PCP or Specialist appointment within 3-5 days of discharge Not Complete  PCP/Specialist Appt Not Complete comments returning to INPT rehab  HRI or Home Care Consult Complete  SW Recovery Care/Counseling Consult Complete  Palliative Care Screening Not Applicable  Skilled Nursing Facility Not Applicable   Some recent data might be hidden

## 2021-03-04 NOTE — Consult Note (Signed)
[facility] [place]  ASSESSMENT / PLAN: 60 y.o. male with right leg pain after below knee amputation.  The patient is attributing this to an angioplasty of his posterior tibial and tibioperoneal trunk arteries done 02/01/2021.  He has a palpable popliteal pulse.  I see no vascular cause for his symptoms.  Please call for questions.  CHIEF COMPLAINT: Right popliteal fossa and calf pain after below-knee amputation  HISTORY OF PRESENT ILLNESS: Richard [name] is a 60 y.o. male well-known to our practice who underwent a right lower extremity angiogram with posterior tibial and tibioperoneal trunk angioplasty with my partner Dr. [name] on 02/01/2021.  He underwent a right first and second toe ray amputation 02/04/21. Unfortunately his right foot continued to deteriorate.  He underwent right below-knee amputation 03/02/2021.  The patient reported popliteal fossa and right lower extremity pain to the orthopedic team this morning.  He reports that since the angiogram nearly a month ago he has had pain in his leg.  He feels like he got "too much" blood flow to his foot afterwards.  He is rather tangential and repetitive in his tongue of the story.  He does not seem to understand the sequence of events for his right lower extremity limb salvage.  Past Medical History:  Diagnosis Date   AICD (automatic cardioverter/defibrillator) present 01/11/2017   Anxiety    CHF (congestive heart failure) (HCC)    CKD (chronic kidney disease)    COPD (chronic obstructive pulmonary disease) (HCC)    never smoker, industrial exposure   Diabetes mellitus with diabetic neuropathy, with long-term current use of insulin (HCC)    GERD (gastroesophageal reflux disease)    High cholesterol    History of gout    "take RX qd" (11/01/2017)   Hypertension    Nonobstructive atherosclerosis of coronary artery    On home oxygen therapy    "2L prn" (11/01/2017)   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HCC)    LLL, present for  years   Systolic HF (heart failure) (HCC)     Past Surgical History:  Procedure Laterality Date   ABDOMINAL AORTOGRAM W/LOWER EXTREMITY N/A 02/01/2021   Procedure: ABDOMINAL AORTOGRAM W/LOWER EXTREMITY;  Surgeon: Nada Libman, MD;  Location: [facility] INVASIVE CV LAB;  Service: Cardiovascular;  Laterality: N/A;   AMPUTATION Left 01/21/2021   Procedure: AMPUTATION BELOW KNEE;  Surgeon: Nadara Mustard, MD;  Location: [facility] OR;  Service: Orthopedics;  Laterality: Left;   AMPUTATION Right 02/04/2021   Procedure: RIGHT FOOT 1ST AND 2ND RAY AMPUTATION;  Surgeon: Nadara Mustard, MD;  Location: [facility] OR;  Service: Orthopedics;  Laterality: Right;   AMPUTATION Right 03/02/2021   Procedure: RIGHT BELOW KNEE AMPUTATION;  Surgeon: Nadara Mustard, MD;  Location: [facility] OR;  Service: Orthopedics;  Laterality: Right;   APPLICATION OF WOUND VAC Left 01/21/2021   Procedure: APPLICATION OF WOUND VAC;  Surgeon: Nadara Mustard, MD;  Location: [facility] OR;  Service: Orthopedics;  Laterality: Left;   APPLICATION OF WOUND VAC  02/04/2021   Procedure: APPLICATION OF WOUND VAC;  Surgeon: Nadara Mustard, MD;  Location: [facility] OR;  Service: Orthopedics;;   APPLICATION OF WOUND VAC  03/02/2021   Procedure: APPLICATION OF WOUND VAC;  Surgeon: Nadara Mustard, MD;  Location: [facility] OR;  Service: Orthopedics;;   CATARACT EXTRACTION W/ INTRAOCULAR LENS IMPLANTW/ TRABECULECTOMY Left ~ 2013   "may have done this when I had my other eye OR"   CIRCUMCISION     CORONARY  STENT INTERVENTION N/A 11/02/2017   Procedure: CORONARY STENT INTERVENTION;  Surgeon: Laurey Morale, MD;  Location: [facility] INVASIVE CV LAB;  Service: Cardiovascular;  Laterality: N/A;   CORONARY STENT INTERVENTION N/A 11/02/2017   Procedure: CORONARY STENT INTERVENTION;  Surgeon: Lyn Records, MD;  Location: [facility] INVAS[facility] CV LAB;  Service: Cardiovascular;  Laterality: N/A;   EYE SURGERY Left ~ 2013   "fell on something in basement; stuck in my eye"   ICD IMPLANT     Medtronic Dual Chamber 01/11/17    MULTIPLE TOOTH EXTRACTIONS     "pulled all my top teeth"   PERIPHERAL VASCULAR BALLOON ANGIOPLASTY  02/01/2021   Procedure: PERIPHERAL VASCULAR BALLOON ANGIOPLASTY;  Surgeon: Nada Libman, MD;  Location: [facility] INVASIVE CV LAB;  Service: Cardiovascular;;  TP Trunk / PT   RIGHT/LEFT HEART CATH AND CORONARY ANGIOGRAPHY N/A 11/02/2017   Procedure: RIGHT/LEFT HEART CATH AND CORONARY ANGIOGRAPHY;  Surgeon: Laurey Morale, MD;  Location: [facility] INVASIVE CV LAB;  Service: Cardiovascular;  Laterality: N/A;   TEE WITHOUT CARDIOVERSION N/A 01/20/2021   Procedure: TRANSESOPHAGEAL ECHOCARDIOGRAM (TEE);  Surgeon: Laurey Morale, MD;  Location: [facility] ENDOSCOPY;  Service: Cardiovascular;  Laterality: N/A;    Family History  Problem Relation Age of Onset   Hypertension Mother    Diabetes Mother    Hypertension Father    Diabetes Father    Diabetes Sister    Diabetes Brother     Social History   Socioeconomic History   Marital status: Divorced    Spouse name: Not on file   Number of children: Not on file   Years of education: Not on file   Highest education level: Not on file  Occupational History   Occupation: disability    Comment: stopped working in 2000 d/t occupational exposures  Tobacco Use   Smoking status: Former    Types: Cigarettes   Smokeless tobacco: Never   Tobacco comments:    "smoked when I drank"  Vaping Use   Vaping Use: Never used  Substance and Sexual Activity   Alcohol use: Not Currently    Comment: 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  Other Topics Concern   Not on file  Social History Narrative   Patient lives at home with wife and some of his 10 children. He self-administers his own medications.   Social Determinants of Health   Financial Resource Strain: Low Risk    Difficulty of Paying Living Expenses: Not very hard  Food Insecurity: No Food Insecurity   Worried About Programme researcher, broadcasting/film/video in  the Last Year: Never true   Ran Out of Food in the Last Year: Never true  Transportation Needs: No Transportation Needs   Lack of Transportation (Medical): No   Lack of Transportation (Non-Medical): No  Physical Activity: Not on file  Stress: Not on file  Social Connections: Not on file  Intimate Partner Violence: Not on file    Allergies  Allergen Reactions   Aspirin Itching   Codeine Hives   Coconut Oil Itching    Current Facility-Administered Medications  Medication Dose Route Frequency Provider Last Rate Last Admin   0.9 %  sodium chloride infusion   Intravenous Continuous Persons, [place], PA 75 mL/hr at 03/03/21 1034 New Bag at 03/03/21 1034   acetaminophen (TYLENOL) tablet 325-650 mg  325-650 mg Oral Q6H PRN Persons, [place], [place]  albuterol (PROVENTIL) (2.5 MG/3ML) 0.083% nebulizer solution 3 mL  3 mL Inhalation Q4H PRN Persons, [place], PA       allopurinol (ZYLOPRIM) tablet 100 mg  100 mg Oral Daily Persons, [place], PA   100 mg at 03/04/21 1142   alum & mag hydroxide-simeth (MAALOX/MYLANTA) 200-200-20 MG/5ML suspension 15-30 mL  15-30 mL Oral Q2H PRN Persons, [place], PA       ascorbic acid (VITAMIN C) tablet 1,000 mg  1,000 mg Oral Daily Persons, [place], PA   1,000 mg at 03/04/21 1141   aspirin chewable tablet 81 mg  81 mg Oral Daily Persons, [place], [place]   81 mg at 03/04/21 1144   atorvastatin (LIPITOR) tablet 40 mg  40 mg Oral Daily Persons, [place], PA   40 mg at 03/04/21 1143   bisacodyl (DULCOLAX) EC tablet 5 mg  5 mg Oral Daily PRN Persons, [place], PA       bisoprolol (ZEBETA) tablet 2.5 mg  2.5 mg Oral Daily Persons, [place], [place]   2.5 mg at 03/04/21 1142   busPIRone (BUSPAR) tablet 5 mg  5 mg Oral BID Persons, [place], PA   5 mg at 03/04/21 1142   clopidogrel (PLAVIX) tablet 75 mg  75 mg Oral Daily Persons, [place], [place]   75 mg at 03/04/21 1143   docusate sodium (COLACE) capsule 100 mg  100 mg Oral Daily Persons, [place], PA   100 mg at  03/04/21 1143   DULoxetine (CYMBALTA) DR capsule 60 mg  60 mg Oral Daily Persons, [place], [place]   60 mg at 03/04/21 1141   empagliflozin (JARDIANCE) tablet 10 mg  10 mg Oral Daily Persons, [place], [place]   10 mg at 03/04/21 1141   fenofibrate tablet 54 mg  54 mg Oral Daily Persons, [place], PA   54 mg at 03/04/21 1141   fluticasone furoate-vilanterol (BREO ELLIPTA) 100-25 MCG/INH 1 puff  1 puff Inhalation Daily Persons, [place], [place]       gabapentin (NEURONTIN) capsule 200 mg  200 mg Oral TID Persons, [place], PA   200 mg at 03/04/21 1142   guaiFENesin-dextromethorphan (ROBITUSSIN DM) 100-10 MG/5ML syrup 15 mL  15 mL Oral Q4H PRN Persons, [place], PA       HYDROmorphone (DILAUDID) injection 0.5 mg  0.5 mg Intravenous Q4H PRN Persons, [place], PA   0.5 mg at 03/03/21 1337   insulin aspart (novoLOG) injection 0-15 Units  0-15 Units Subcutaneous TID WC Persons, [place], PA   2 Units at 03/03/21 1741   insulin glargine-yfgn (SEMGLEE) injection 9 Units  9 Units Subcutaneous BID Persons, [place], [place]   9 Units at 03/03/21 2138   isosorbide mononitrate (IMDUR) 24 hr tablet 30 mg  30 mg Oral Daily Persons, [place], PA   30 mg at 03/04/21 1143   magnesium oxide (MAG-OX) tablet 400 mg  400 mg Oral Daily Persons, [place], PA   400 mg at 03/04/21 1143   nutrition supplement (JUVEN) (JUVEN) powder packet 1 packet  1 packet Oral BID BM Persons, [place], PA   1 packet at 03/04/21 1350   ondansetron (ZOFRAN) injection 4 mg  4 mg Intravenous Q6H PRN Persons, [place], PA   4 mg at 03/02/21 2059   oxyCODONE (Oxy IR/ROXICODONE) immediate release tablet 5-10 mg  5-10 mg Oral Q4H PRN Persons, [place], PA   10 mg at 03/03/21 2253  pantoprazole (PROTONIX) EC tablet 40 mg  40 mg Oral QPM Persons, [place], [place]   40 mg at 03/03/21 1741   phenol (CHLORASEPTIC) mouth spray 1 spray  1 spray Mouth/Throat PRN Persons, [place], [place]       polyethylene glycol (MIRALAX / GLYCOLAX) packet 17 g  17 g Oral Daily  PRN Persons, [place], [place]       potassium chloride SA (KLOR-CON) CR tablet 20-40 mEq  20-40 mEq Oral Daily PRN Persons, [place], [place]       sacubitril-valsartan (ENTRESTO) 24-26 mg per tablet  1 tablet Oral BID Persons, [place], [place]   1 tablet at 03/04/21 1140   spironolactone (ALDACTONE) tablet 12.5 mg  12.5 mg Oral Daily Persons, [place], [place]   12.5 mg at 03/04/21 1143   torsemide (DEMADEX) tablet 40 mg  40 mg Oral Daily Persons, [place], PA   40 mg at 03/04/21 1141   zinc sulfate capsule 220 mg  220 mg Oral Daily Persons, [place], [place]   220 mg at 03/04/21 1142    REVIEW OF SYSTEMS:  [X]  denotes positive finding, [ ]  denotes negative finding Cardiac  Comments:  Chest pain or chest pressure:    Shortness of breath upon exertion:    Short of breath when lying flat:    Irregular heart rhythm:        Vascular    Pain in calf, thigh, or hip brought on by ambulation:    Pain in feet at night that wakes you up from your sleep:     Blood clot in your veins:    Leg swelling:         Pulmonary    Oxygen at home:    Productive cough:     Wheezing:         Neurologic    Sudden weakness in arms or legs:     Sudden numbness in arms or legs:     Sudden onset of difficulty speaking or slurred speech:    Temporary loss of vision in one eye:     Problems with dizziness:         Gastrointestinal    Blood in stool:     Vomited blood:         Genitourinary    Burning when urinating:     Blood in urine:        Psychiatric    Major depression:         Hematologic    Bleeding problems:    Problems with blood clotting too easily:        Skin    Rashes or ulcers:        Constitutional    Fever or chills:      PHYSICAL EXAM Vitals:   03/03/21 1653 03/03/21 2106 03/04/21 0533 03/04/21 0822  BP: 102/81 121/77 117/80 125/86  Pulse: 74 74 76 64  Resp: 18 17 16 18   Temp: (!) 97.5 F (36.4 C) (!) 97.5 F (36.4 C) 97.7 F (36.5 C) 97.8 F (36.6 C)  TempSrc: Oral Oral Oral  Oral  SpO2: (!) 85% 95% 99% 92%  Weight:      Height:        Constitutional: Chronically ill appearing. No distress. Appears well nourished.  Neurologic: CN intact. no focal findings. no sensory loss. Psychiatric:  Mood and affect symmetric and appropriate. Eyes:  No icterus. No conjunctival pallor. Ears, nose, throat:  mucous membranes moist. Midline trachea.  Cardiac: regular rate and rhythm.  Respiratory:  unlabored. Abdominal:  soft, non-tender, non-distended.  Peripheral vascular: R BKA stump with VAC and stump protector. No edema or tenderness in the stump or popliteal fossa. 2+ R popliteal pulse. Extremity: no edema. no cyanosis. no pallor.  Skin: no gangrene. no ulceration.  Lymphatic: no Stemmer's sign. no palpable lymphadenopathy.  PERTINENT LABORATORY AND RADIOLOGIC DATA  Most recent CBC CBC Latest Ref Rng & Units 03/04/2021 03/03/2021 02/08/2021  WBC 4.0 - 10.5 K/uL 15.1(H) 15.1(H) 8.7  Hemoglobin 13.0 - 17.0 g/dL 8.3(L) 9.2(L) 10.8(L)  Hematocrit 39.0 - 52.0 % 27.0(L) 30.2(L) 34.1(L)  Platelets 150 - 400 K/uL 482(H) 470(H) 242     Most recent CMP CMP Latest Ref Rng & Units 03/04/2021 03/03/2021 02/08/2021  Glucose 70 - 99 mg/dL 87 683(M) 90  BUN 6 - 20 mg/dL 19(Q) 20 22(W)  Creatinine 0.61 - 1.24 mg/dL 9.79 8.92 1.19  Sodium 135 - 145 mmol/L 137 133(L) 135  Potassium 3.5 - 5.1 mmol/L 4.5 4.9 3.6  Chloride 98 - 111 mmol/L 96(L) 98 97(L)  CO2 22 - 32 mmol/L 30 28 30   Calcium 8.9 - 10.3 mg/dL ) 4.1(D) 4.0(C)  Total Protein 6.5 - 8.1 g/dL - - 7.3  Total Bilirubin 0.3 - 1.2 mg/dL - - 0.9  Alkaline Phos 38 - 126 U/L - - 77  AST 15 - 41 U/L - - 26  ALT 0 - 44 U/L - - 25    Renal function Estimated Creatinine Clearance: 97.8 mL/min (by C-G formula based on SCr of 1.13 mg/dL).  Hgb A1c MFr Bld (%)  Date Value  01/17/2021 10.3 (H)    LDL Cholesterol  Date Value Ref Range Status  04/29/2020 42 0 - 99 mg/dL Final    Comment:           Total Cholesterol/HDL:CHD  Risk Coronary Heart Disease Risk Table                     Men   Women  1/2 Average Risk   3.4   3.3  Average Risk       5.0   4.4  2 X Average Risk   9.6   7.1  3 X Average Risk  23.4   11.0        Use the calculated Patient Ratio above and the CHD Risk Table to determine the patient's CHD Risk.        ATP III CLASSIFICATION (LDL):  <100     mg/dL   Optimal  13/09/2019  mg/dL   Near or Above                    Optimal  130-159  mg/dL   Borderline  818-563  mg/dL   High  149-702     mg/dL   Very High Performed at [facility], [address], [place], [place]    Direct LDL  Date Value Ref Range Status  10/03/2019 66.8 0 - 99 mg/dL Final    Comment:    Performed at [facility], [address], [place], [place]     [id number]. Rande Brunt, MD [facility] Phone Number: [phone] 03/04/2021 2:09 PM  Total time spent on preparing this encounter including chart review, data review, collecting history, examining the patient, coordinating care for this established patient, 40 minutes.  Portions of this report may have been  transcribed using voice recognition software.  Every effort has been made to ensure accuracy; however, inadvertent computerized transcription errors may still be present.

## 2021-03-05 LAB — GLUCOSE, CAPILLARY
Glucose-Capillary: 111 mg/dL — ABNORMAL HIGH (ref 70–99)
Glucose-Capillary: 120 mg/dL — ABNORMAL HIGH (ref 70–99)
Glucose-Capillary: 153 mg/dL — ABNORMAL HIGH (ref 70–99)
Glucose-Capillary: 153 mg/dL — ABNORMAL HIGH (ref 70–99)

## 2021-03-05 MED ORDER — GABAPENTIN 300 MG PO CAPS
300.0000 mg | ORAL_CAPSULE | Freq: Three times a day (TID) | ORAL | Status: DC
  Administered 2021-03-05 – 2021-03-08 (×10): 300 mg via ORAL
  Filled 2021-03-05 (×10): qty 1

## 2021-03-05 NOTE — Progress Notes (Signed)
Patient ID: Richard [name], male   DOB: 1960-08-24, 60 y.o.   MRN: [id number] Mr. Calo is still complaining of his right lower extremity pain.  We appreciate the consult from vascular surgery.  It looks like the perfusion is still maintained from their procedure.  They did not see an indication from what they have done in terms of relating to his right lower extremity pain.  The patient was on Lyrica at home but he is on gabapentin 200 mg 3 times a day here.  He does feel like the gabapentin here in the hospital has been better for him.  We will up that to 300 mg 3 times a day and see if that helps.  He has a stump splint in place in the right lower extremity.  There is an incisional VAC is not really putting out anything.  That is helping the incision to mature.  Dr. Audrie Lia team will likely remove that on Monday versus sending him home with a Prevena VAC.

## 2021-03-06 LAB — GLUCOSE, CAPILLARY
Glucose-Capillary: 147 mg/dL — ABNORMAL HIGH (ref 70–99)
Glucose-Capillary: 133 mg/dL — ABNORMAL HIGH (ref 70–99)
Glucose-Capillary: 159 mg/dL — ABNORMAL HIGH (ref 70–99)
Glucose-Capillary: 138 mg/dL — ABNORMAL HIGH (ref 70–99)

## 2021-03-06 NOTE — Progress Notes (Signed)
   Subjective:  No acute events overnight. Resting comfortably this AM.  Pain better controlled today.   Objective:   VITALS:   Vitals:   03/05/21 1635 03/05/21 2039 03/06/21 0545 03/06/21 0737  BP: 120/85 131/84 135/82 118/86  Pulse: 84 93 92 89  Resp: 19 17 17 17   Temp: 98.1 F (36.7 C) 98.6 F (37 C) 97.7 F (36.5 C) 98.7 F (37.1 C)  TempSrc: Oral Oral Oral Oral  SpO2: 93% 90% 91% 91%  Weight:      Height:        Gen: Resting comfortably Pulm: Normal WOB on RA CV: Normal rate RLE: dressing clean and dry, stump splint in place, [facility] intact w/ good seal but no output    Lab Results  Component Value Date   WBC 15.1 (H) 03/04/2021   HGB 8.3 (L) 03/04/2021   HCT 27.0 (L) 03/04/2021   MCV 92.5 03/04/2021   PLT 482 (H) 03/04/2021     Assessment/Plan:  4 Days Post-Op s/p R BKA  - Up with PT/OT - Pain control - Discharge planning   Richard [name] Richard [name] 03/06/2021, 10:05 AM [phone]

## 2021-03-07 LAB — GLUCOSE, CAPILLARY
Glucose-Capillary: 117 mg/dL — ABNORMAL HIGH (ref 70–99)
Glucose-Capillary: 106 mg/dL — ABNORMAL HIGH (ref 70–99)
Glucose-Capillary: 188 mg/dL — ABNORMAL HIGH (ref 70–99)
Glucose-Capillary: 149 mg/dL — ABNORMAL HIGH (ref 70–99)

## 2021-03-07 MED ORDER — INSULIN GLARGINE-YFGN 100 UNIT/ML ~~LOC~~ SOLN
8.0000 [IU] | Freq: Two times a day (BID) | SUBCUTANEOUS | Status: DC
  Administered 2021-03-07 – 2021-03-08 (×3): 8 [IU] via SUBCUTANEOUS
  Filled 2021-03-07 (×4): qty 0.08

## 2021-03-07 NOTE — Progress Notes (Signed)
Patient is postop day 5 status post below-knee amputation.  He is doing well he sitting up eating breakfast has made good progress with physical therapy.  They have recommended home with home health services.  Vital signs stable afebrile alert pleasant to exam.  Wound VAC is functioning quite well with 2 green checks no drainage in the canister.  Talk to patient about discharge his biggest concern is that his bed at home is much higher than the hospital bed he is concerned about being able to safely get in and out of bed.   Status post above.  [name] insulin was adjusted per recommendation.  Talked to him about working with therapy today with his hospital bed in a higher position.  Plan for discharge with home health services tomorrow

## 2021-03-07 NOTE — Progress Notes (Signed)
Occupational Therapy Treatment Patient Details Name: Richard [name] MRN: [id number] DOB: [birth year] Today's Date: 03/07/2021   History of present illness 60 y.o.-year-old male who is status post foot salvage intervention on the right with a first and second ray amputation. Patient has had progressive wound dehiscence with exposed bone necrotic tissue. Pt is now s/p R Transtibial amputation. 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   OT comments  Pt with progress towards goals this session. Pt educated on safe anterior/posterior transfers to bed<>w/c and re-educated on safe lateral transfer w/c to recliner requiring min guard for both transfers. Continuing to recommend HHOT with 24 hr supervision assistance  to address safety with transfers and self care post d/c home. Will continue to follow in the acute setting.    Recommendations for follow up therapy are one component of a multi-disciplinary discharge planning process, led by the attending physician.  Recommendations may be updated based on patient status, additional functional criteria and insurance authorization.    Follow Up Recommendations  Supervision/Assistance - 24 hour;Home health OT    Equipment Recommendations  3 in 1 bedside commode    Recommendations for Other Services      Precautions / Restrictions Precautions Precautions: Fall Precaution Comments: Recent 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) Required Braces or Orthoses: Other Brace Other Brace: R limb guard Restrictions Weight Bearing Restrictions: Yes RLE Weight Bearing: Non weight bearing LLE Weight Bearing: Non weight bearing Other Position/Activity Restrictions:       Mobility Bed Mobility Overal bed mobility: Modified Independent             General bed mobility comments: Pt sitting EOB upon arrival    Transfers Overall transfer level: Needs assistance Equipment used: None Transfers: Lateral/Scoot Transfers;anterior/posterior        Anterior/posterior: Min guard   Lateral/Scoot Transfers: Min guard General transfer comment: Pt educated on safe anterior/posterior and lateral scooting to w/c and recliner.    Balance Overall balance assessment: Needs assistance Sitting-balance support: Single extremity supported Sitting balance-Leahy Scale: Good Sitting balance - Comments: Sitting supported with one UE on raised HOB upon arrival. Able to scoot backwards in bed with use of BUEs.                                   ADL either performed or assessed with clinical judgement   ADL Overall ADL's : Needs assistance/impaired                         Toilet Transfer: Min guard;Anterior/posterior;Cueing for safety Toilet Transfer Details (indicate cue type and reason): Pt required min guard for safety and min verbal cues for safety and sequencing of lateral and anterior/posterior transfers.         Functional mobility during ADLs: Min guard;Cueing for safety;Cueing for sequencing General ADL Comments: Pt educated on safe anterior/posterior and lateral scooting to w/c and recliner. Required min guard for all functional mobility.     Vision   Vision Assessment?: No apparent visual deficits   Perception     Praxis      Cognition Arousal/Alertness: Awake/alert Behavior During Therapy: WFL for tasks assessed/performed Overall Cognitive Status: Within Functional Limits for tasks assessed                                 General Comments: Pt able to follow mutistep  commands.        Exercises     Shoulder Instructions       General Comments SpO2 93% after anterior/posterior transfer w/c<>bed. Applied 2L O2 [place], SpO2 increased to 100%.    Pertinent Vitals/ Pain       Pain Assessment: Faces Faces Pain Scale: Hurts a little bit Pain Intervention(s): Monitored during session  Home Living                                          Prior Functioning/Environment              Frequency  Min  2X/week        Progress Toward Goals  OT Goals(current goals can now be found in the care plan section)     Acute Rehab OT Goals Patient Stated Goal: to go home OT Goal Formulation: With patient Time For Goal Achievement: 03/17/21 Potential to Achieve Goals: Good ADL Goals Pt Will Perform Grooming: Independently;sitting Pt Will Perform Lower Body Bathing: with set-up;sitting/lateral leans Pt Will Perform Lower Body Dressing: sitting/lateral leans;with supervision Pt Will Transfer to Toilet: with supervision;bedside commode Pt Will Perform Toileting - Clothing Manipulation and hygiene: with supervision;sitting/lateral leans  Plan      Co-evaluation                 AM-PAC OT "6 Clicks" Daily Activity     Outcome Measure   Help from another person eating meals?: None Help from another person taking care of personal grooming?: None Help from another person toileting, which includes using toliet, bedpan, or urinal?: A Little Help from another person bathing (including washing, rinsing, drying)?: A Little Help from another person to put on and taking off regular upper body clothing?: None Help from another person to put on and taking off regular lower body clothing?: A Little 6 Click Score: 21    End of Session Equipment Utilized During Treatment: Oxygen  OT Visit Diagnosis: Muscle weakness (generalized) (M62.81);Other abnormalities of gait and mobility (R26.89);Pain Pain - part of body:  (Residual limb R)   Activity Tolerance Patient tolerated treatment well   Patient Left in chair;with call bell/phone within reach;with chair alarm set;Other (comment) (With O2 2L applied)   Nurse Communication Other (comment) (Pt needs bandages reapplied.)        Time: 0823-0907 OT Time Calculation (min): 44 min  Charges: OT General Charges $OT Visit: 1 Visit OT Treatments $Self Care/Home Management : 38-52 mins  Prudence Davidson, OTS Acute Rehab Office: [phone]    Tyan Lasure 03/07/2021, 10:33 AM

## 2021-03-07 NOTE — Progress Notes (Signed)
Physical Therapy Treatment Patient Details Name: Jamaul Heist MRN: [id number] DOB: Aug 27, 1960 Today's Date: 03/07/2021   History of Present Illness Mr. Wengert is a 60 y.o.-year-old male who is status post foot salvage intervention on the right with a first and second ray amputation.  Patient has had progressive wound dehiscence with exposed bone necrotic tissue. Pt is now s/p R Transtibial amputation. 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    PT Comments    Patient received in recliner, pleasant and cooperative; reports that he was able to work on getting to/from high bed with OT earlier this morning and declines further practice. Continued working on general lateral scoot transfers today recliner <-> bed and bed <-> WC. Also progressed WC distance and he was able to tolerate WC propulsion around the entire unit today, but fatigued. Left in bed with all needs met, bed alarm active. Will continue to follow.    Recommendations for follow up therapy are one component of a multi-disciplinary discharge planning process, led by the attending physician.  Recommendations may be updated based on patient status, additional functional criteria and insurance authorization.  Follow Up Recommendations  Home health PT     Equipment Recommendations  None recommended by PT    Recommendations for Other Services       Precautions / Restrictions Precautions Precautions: Fall Precaution Comments: Recent 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), new R BKA Required Braces or Orthoses: Other Brace Other Brace: R limb guard Restrictions Weight Bearing Restrictions: Yes RLE Weight Bearing: Non weight bearing LLE Weight Bearing: Non weight bearing     Mobility  Bed Mobility Overal bed mobility: Modified Independent Bed Mobility: Supine to Sit;Sit to Supine     Supine to sit: Modified independent (Device/Increase time) Sit to supine: Modified independent (Device/Increase time)   General bed mobility comments: extra time and  effort, use of rails    Transfers Overall transfer level: Needs assistance Equipment used: None Transfers: Lateral/Scoot Transfers          Lateral/Scoot Transfers: Min guard General transfer comment: min guard for safety/stabilizing WC as he moves in and out of it, cues for technique  Ambulation/Gait             General Gait Details: non-ambulatory   Theme park manager mobility: Yes Wheelchair propulsion: Both upper extremities Wheelchair parts: Supervision/cueing Distance: 547f Wheelchair Assistance Details (indicate cue type and reason): supervision, VC for managing arm rests and brakes  Modified Rankin (Stroke Patients Only)       Balance Overall balance assessment: Needs assistance Sitting-balance support: Bilateral upper extremity supported Sitting balance-Leahy Scale: Good                                      Cognition Arousal/Alertness: Awake/alert Behavior During Therapy: WFL for tasks assessed/performed Overall Cognitive Status: Within Functional Limits for tasks assessed                                 General Comments: Pt able to follow mutistep commands.      Exercises      General Comments General comments (skin integrity, edema, etc.): VSS on 3LPM O2      Pertinent Vitals/Pain Pain Assessment: Faces Faces Pain Scale: Hurts a little bit Pain Location: R leg.  phantom limb pain Pain Descriptors / Indicators: Aching;Sore;Discomfort Pain Intervention(s): Limited activity within patient's tolerance;Monitored during session    Home Living                      Prior Function            PT Goals (current goals can now be found in the care plan section) Acute Rehab PT Goals Patient Stated Goal: to go home PT Goal Formulation: With patient Time For Goal Achievement: 03/17/21 Potential to Achieve Goals: Good Progress towards PT goals:  Progressing toward goals    Frequency    Min 3X/week      PT Plan Current plan remains appropriate    Co-evaluation              AM-PAC PT "6 Clicks" Mobility   Outcome Measure  Help needed turning from your back to your side while in a flat bed without using bedrails?: A Little Help needed moving from lying on your back to sitting on the side of a flat bed without using bedrails?: A Little Help needed moving to and from a bed to a chair (including a wheelchair)?: A Little Help needed standing up from a chair using your arms (e.g., wheelchair or bedside chair)?: Total Help needed to walk in hospital room?: Total Help needed climbing 3-5 steps with a railing? : Total 6 Click Score: 12    End of Session   Activity Tolerance: Patient tolerated treatment well Patient left: in bed;with call bell/phone within reach;with bed alarm set Nurse Communication: Mobility status PT Visit Diagnosis: Other abnormalities of gait and mobility (R26.89);Pain Pain - Right/Left: Right Pain - part of body: Leg     Time: 7588-3254 PT Time Calculation (min) (ACUTE ONLY): 34 min  Charges:  $Therapeutic Activity: 8-22 mins $Wheel Chair Management: 8-22 mins                    Windell Norfolk, DPT, PN2   Supplemental Physical Therapist [place]    Pager [phone] Acute Rehab Office [phone]

## 2021-03-07 NOTE — Care Management Important Message (Signed)
Important Message  Patient Details  Name: Rhian Funari MRN: [id number] Date of Birth: 03/22/1961   Medicare Important Message Given:  Yes     Torrion Witter Stefan Church 03/07/2021, 4:07 PM

## 2021-03-08 ENCOUNTER — Other Ambulatory Visit: Payer: Self-pay | Admitting: Physician Assistant

## 2021-03-08 ENCOUNTER — Other Ambulatory Visit: Payer: Self-pay

## 2021-03-08 DIAGNOSIS — I739 Peripheral vascular disease, unspecified: Secondary | ICD-10-CM

## 2021-03-08 LAB — GLUCOSE, CAPILLARY
Glucose-Capillary: 96 mg/dL (ref 70–99)
Glucose-Capillary: 143 mg/dL — ABNORMAL HIGH (ref 70–99)

## 2021-03-08 MED ORDER — OXYCODONE HCL 5 MG PO TABS: 5.0000 mg | ORAL_TABLET | ORAL | 0 refills | Status: DC | PRN

## 2021-03-08 MED ORDER — JUVEN PO PACK: 1.0000 | PACK | Freq: Two times a day (BID) | ORAL | 0 refills | Status: DC

## 2021-03-08 MED ORDER — INSULIN GLARGINE-YFGN 100 UNIT/ML ~~LOC~~ SOLN: 8.0000 [IU] | Freq: Two times a day (BID) | SUBCUTANEOUS | 11 refills | Status: DC

## 2021-03-08 NOTE — Progress Notes (Signed)
Occupational Therapy Treatment Patient Details Name: Richard [name] MRN: [id number] DOB: [birth year] Today's Date: 03/08/2021   History of present illness 60 y.o.-year-old male s/p foot salvage intervention on the right with a first and second ray amputation.  Patient has had progressive wound dehiscence with exposed bone necrotic tissue. Pt is now s/p R Transtibial amputation. 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   OT comments  Pt with progress towards goals this session. Pt participated in lateral scoot transfer w/c to [facility] and BSC to recliner with Min guard. Pt educated on modified dressing technique for LB dressing to dress in bed and technique for portable wound vac management, required Min A for line management during LB dressing. Pt verbalized and demonstrated understanding of education. Continuing to recommend HHOT and 24 supervision assistance post d/c to further address safety, safe shower transfer techniques, and ADL/IADL tasks.    Recommendations for follow up therapy are one component of a multi-disciplinary discharge planning process, led by the attending physician.  Recommendations may be updated based on patient status, additional functional criteria and insurance authorization.    Follow Up Recommendations  Supervision/Assistance - 24 hour;Home health OT    Equipment Recommendations  3 in 1 bedside commode    Recommendations for Other Services      Precautions / Restrictions Precautions Precautions: Fall Precaution Comments: Recent 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), new R BKA Other Brace: R limb guard Restrictions Weight Bearing Restrictions: Yes RLE Weight Bearing: Non weight bearing LLE Weight Bearing: Non weight bearing       Mobility Bed Mobility Overal bed mobility: Modified Independent Bed Mobility: Supine to Sit     Supine to sit: Modified independent (Device/Increase time)     General bed mobility comments: extra time and effort, use of rails    Transfers Overall  transfer level: Needs assistance Equipment used: None Transfers: Lateral/Scoot Transfers          Lateral/Scoot Transfers: Min guard Anterior/posterior transfers: Min guard  General transfer comment: min guard for safety/stabilizing WC/BSC as he moves in and out of it, cues for technique    Balance Overall balance assessment: Needs assistance Sitting-balance support: Bilateral upper extremity supported Sitting balance-Leahy Scale: Good Sitting balance - Comments: Sits supported with BUEs on bed.                                   ADL either performed or assessed with clinical judgement   ADL Overall ADL's : Needs assistance/impaired                 Upper Body Dressing : Modified independent;Sitting   Lower Body Dressing: Minimal assistance;Sitting/lateral leans Lower Body Dressing Details (indicate cue type and reason): Pt educated on LB dressing technique to dress in bed (simulated in recliner with residual limbs reclined) and required min A for wound vac line management. Pt also educated on placing portable line through R pant leg when nursing changes to the portable wound vac. Toilet Transfer: Min guard;Requires drop arm;BSC;Cueing for safety;Cueing for sequencing Toilet Transfer Details (indicate cue type and reason): Pt educated on lateral transfer w/c to [facility] with drop-arm. Pt completed with min guard for safety and verbal cues for safety and correct sequence.         Functional mobility during ADLs: Min guard;Cueing for safety;Cueing for sequencing General ADL Comments: Pt with good carryover of lateral scooting education from previous sessions. Pt demonstrated understanding of LB  dressing modified techniques. Pt continuing to require min guard during functional transfers for safety.     Vision   Vision Assessment?: No apparent visual deficits   Perception     Praxis      Cognition Arousal/Alertness: Awake/alert Behavior During Therapy: WFL for  tasks assessed/performed Overall Cognitive Status: Within Functional Limits for tasks assessed                                 General Comments: Pt able to follow mutistep commands.        Exercises     Shoulder Instructions       General Comments Pt on room air for session, requesting O2 after transfers/LB dressing and therapist reapplied 3L O2 [place]. SpO2 96% on 3L of O2 at end of session.    Pertinent Vitals/ Pain       Pain Assessment: Faces Faces Pain Scale: Hurts a little bit Pain Descriptors / Indicators: Sore;Discomfort Pain Intervention(s): Monitored during session  Home Living                                          Prior Functioning/Environment              Frequency  Min 2X/week        Progress Toward Goals  OT Goals(current goals can now be found in the care plan section)     Acute Rehab OT Goals Patient Stated Goal: to go home OT Goal Formulation: With patient Time For Goal Achievement: 03/17/21 Potential to Achieve Goals: Good ADL Goals Pt Will Perform Grooming: Independently;sitting Pt Will Perform Lower Body Bathing: with set-up;sitting/lateral leans Pt Will Perform Lower Body Dressing: sitting/lateral leans;with supervision Pt Will Transfer to Toilet: with supervision;bedside commode Pt Will Perform Toileting - Clothing Manipulation and hygiene: with supervision;sitting/lateral leans  Plan      Co-evaluation                 AM-PAC OT "6 Clicks" Daily Activity     Outcome Measure   Help from another person eating meals?: None Help from another person taking care of personal grooming?: None Help from another person toileting, which includes using toliet, bedpan, or urinal?: A Little Help from another person bathing (including washing, rinsing, drying)?: A Little Help from another person to put on and taking off regular upper body clothing?: None Help from another person to put on and taking off  regular lower body clothing?: A Little 6 Click Score: 21    End of Session Equipment Utilized During Treatment: Oxygen  OT Visit Diagnosis: Muscle weakness (generalized) (M62.81);Other abnormalities of gait and mobility (R26.89);Pain   Activity Tolerance Patient tolerated treatment well   Patient Left in chair;with call bell/phone within reach;with chair alarm set;Other (comment)   Nurse Communication Mobility status        Time: 2197-5883 OT Time Calculation (min): 33 min  Charges: OT General Charges $OT Visit: 1 Visit OT Treatments $Self Care/Home Management : 23-37 mins  Prudence Davidson, OTS Acute Rehab Office: [phone]   Richard [name] 03/08/2021, 1:35 PM

## 2021-03-08 NOTE — Plan of Care (Signed)
  Problem: Education: Goal: Knowledge of General Education information will improve Description Including pain rating scale, medication(s)/side effects and non-pharmacologic comfort measures Outcome: Progressing   

## 2021-03-08 NOTE — Progress Notes (Signed)
Patient discharged to home with instructions given to his significant other, connected to [facility]ena wound vac, and questions answered.

## 2021-03-08 NOTE — Progress Notes (Signed)
Patient ID: Richard [name], male   DOB: 03/16/1961, 60 y.o.   MRN: [id number] Patient progressed well with therapy yesterday.  Anticipate discharge as soon as patient is able to discharge with a ramp and ride.  No drainage in the wound VAC canister good suction fit.  Patient has full range of motion of his knee and is sitting up comfortably.

## 2021-03-08 NOTE — Discharge Summary (Signed)
Discharge Diagnoses:  Active Problems:   Subacute osteomyelitis, right ankle and foot (HCC)   Wound dehiscence   Surgeries: Procedure(s): RIGHT BELOW KNEE AMPUTATION APPLICATION OF WOUND VAC on 03/02/2021    Consultants:   Discharged Condition: Improved  Hospital Course: Carnie Bruemmer is an 60 y.o. male who was admitted 03/02/2021 with a chief complaint of right foot wound dehiscence, with a final diagnosis of Gangrenous Dehiscence Right Foot.  Patient was brought to the operating room on 03/02/2021 and underwent Procedure(s): RIGHT BELOW KNEE AMPUTATION APPLICATION OF WOUND VAC.    Patient was given perioperative antibiotics:  Anti-infectives (From admission, onward)    Start     Dose/Rate Route Frequency Ordered Stop   03/02/21 1930  ceFAZolin (ANCEF) IVPB 2g/100 mL premix        2 g 200 mL/hr over 30 Minutes Intravenous Every 8 hours 03/02/21 1541 03/03/21 0846   03/02/21 0836  ceFAZolin (ANCEF) 2-4 GM/100ML-% IVPB       Note to Pharmacy: Leonie Man   : cabinet override      03/02/21 0836 03/02/21 2044   03/02/21 0830  ceFAZolin (ANCEF) IVPB 2g/100 mL premix        2 g 200 mL/hr over 30 Minutes Intravenous On call to O.R. 03/02/21 6222 03/02/21 1126     .  Patient was given sequential compression devices, early ambulation, and aspirin for DVT prophylaxis.  Recent vital signs: Patient Vitals for the past 24 hrs:  BP Temp Temp src Pulse Resp SpO2  03/08/21 0819 120/86 97.8 F (36.6 C) Oral 79 14 97 %  03/08/21 0522 115/80 98.3 F (36.8 C) Oral 81 17 90 %  03/07/21 2028 (!) 98/56 98.8 F (37.1 C) Oral 68 18 95 %  03/07/21 2025 (!) 98/56 98.8 F (37.1 C) Oral 68 18 95 %  03/07/21 1641 (!) 106/93 97.7 F (36.5 C) Oral 73 16 (!) 89 %  .  Recent laboratory studies: No results found.  Discharge Medications:   Allergies as of 03/08/2021       Reactions   Aspirin Itching   Codeine Hives   Coconut Oil Itching        Medication List     STOP taking these  medications    acetaminophen 650 MG CR tablet Commonly known as: TYLENOL   doxycycline 100 MG tablet Commonly known as: VIBRA-TABS   gabapentin 100 MG capsule Commonly known as: NEURONTIN   HYDROcodone-acetaminophen 5-325 MG tablet Commonly known as: NORCO/VICODIN       TAKE these medications    albuterol 108 (90 Base) MCG/ACT inhaler Commonly known as: VENTOLIN HFA Inhale 1-2 puffs into the lungs every 4 (four) hours as needed for wheezing or shortness of breath.   allopurinol 100 MG tablet Commonly known as: ZYLOPRIM Take 1 tablet (100 mg total) by mouth daily.   ascorbic acid 1000 MG tablet Commonly known as: VITAMIN C Take 1 tablet (1,000 mg total) by mouth daily.   aspirin 81 MG chewable tablet Chew 81 mg by mouth daily.   atorvastatin 40 MG tablet Commonly known as: LIPITOR One tab daily What changed:  how much to take how to take this when to take this additional instructions   bisoprolol 5 MG tablet Commonly known as: ZEBETA Take 0.5 tablets (2.5 mg total) by mouth daily.   Breo Ellipta 100-25 MCG/INH Aepb Generic drug: fluticasone furoate-vilanterol Inhale 1 puff into the lungs daily.   busPIRone 5 MG tablet Commonly known as: BUSPAR Take 1  tablet (5 mg total) by mouth 2 (two) times daily.   clopidogrel 75 MG tablet Commonly known as: PLAVIX Take 1 tablet (75 mg total) by mouth daily.   diclofenac Sodium 1 % Gel Commonly known as: VOLTAREN Apply 2 g topically 4 (four) times daily. What changed:  when to take this reasons to take this   digoxin 0.125 MG tablet Commonly known as: LANOXIN Take 125 mcg by mouth daily.   docusate sodium 100 MG capsule Commonly known as: COLACE Take 1 capsule (100 mg total) by mouth daily.   DULoxetine 60 MG capsule Commonly known as: CYMBALTA Take 1 capsule (60 mg total) by mouth daily.   empagliflozin 10 MG Tabs tablet Commonly known as: JARDIANCE Take 1 tablet (10 mg total) by mouth daily.    eplerenone 50 MG tablet Commonly known as: INSPRA Take 50 mg by mouth daily.   fenofibrate 145 MG tablet Commonly known as: TRICOR Take 1 tablet (145 mg total) by mouth daily.   HumuLIN R U-500 KwikPen 500 UNIT/ML KwikPen Generic drug: insulin regular human CONCENTRATED Inject 5 Units into the skin daily.   hydrALAZINE 50 MG tablet Commonly known as: APRESOLINE Take 75 mg by mouth every 8 (eight) hours.   insulin glargine-yfgn 100 UNIT/ML injection Commonly known as: Semglee (yfgn) Inject 0.08 mLs (8 Units total) into the skin 2 (two) times daily. What changed: how much to take   isosorbide mononitrate 30 MG 24 hr tablet Commonly known as: IMDUR Take 1 tablet (30 mg total) by mouth daily.   MAGnesium-Oxide 400 (241.3 Mg) MG tablet Generic drug: magnesium oxide Take 1 tablet (400 mg total) by mouth daily.   melatonin 3 MG Tabs tablet Take 1 tablet (3 mg total) by mouth at bedtime.   multivitamin with minerals Tabs tablet Take 1 tablet by mouth daily.   nutrition supplement (JUVEN) Pack Take 1 packet by mouth 2 (two) times daily between meals.   oxyCODONE 5 MG immediate release tablet Commonly known as: Oxy IR/ROXICODONE Take 1-2 tablets (5-10 mg total) by mouth every 4 (four) hours as needed for moderate pain (pain score 4-6).   polyethylene glycol 17 g packet Commonly known as: MIRALAX / GLYCOLAX Take 17 g by mouth daily as needed for mild constipation.   pregabalin 150 MG capsule Commonly known as: LYRICA Take 150 mg by mouth 3 (three) times daily.   sacubitril-valsartan 24-26 MG Commonly known as: ENTRESTO Take 1 tablet by mouth 2 (two) times daily.   spironolactone 25 MG tablet Commonly known as: ALDACTONE Take 0.5 tablets (12.5 mg total) by mouth daily.   Torsemide 40 MG Tabs Take 40 mg by mouth daily.   torsemide 20 MG tablet Commonly known as: DEMADEX Take 20 mg by mouth 2 (two) times daily.   zinc sulfate 220 (50 Zn) MG capsule Take 1 capsule  (220 mg total) by mouth daily.        Diagnostic Studies: CUP PACEART REMOTE DEVICE CHECK  Result Date: 03/01/2021 Scheduled remote reviewed. Normal device function.  Optivol crossed threshold 8/14, ongoing, triage HF "high" Route to triage, followed by HF Next remote 91 days. LR 03/01/21 see note in epic. PEP   Patient benefited maximally from their hospital stay and there were no complications.     Disposition: Discharge disposition: 01-Home or Self Care      Discharge Instructions     Call MD / Call 911   Complete by: As directed    If you experience chest pain  or shortness of breath, CALL 911 and be transported to the hospital emergency room.  If you develope a fever above 101 F, pus (white drainage) or increased drainage or redness at the wound, or calf pain, call your surgeon's office.   Call MD / Call 911   Complete by: As directed    If you experience chest pain or shortness of breath, CALL 911 and be transported to the hospital emergency room.  If you develope a fever above 101 F, pus (white drainage) or increased drainage or redness at the wound, or calf pain, call your surgeon's office.   Constipation Prevention   Complete by: As directed    Drink plenty of fluids.  Prune juice may be helpful.  You may use a stool softener, such as Colace (over the counter) 100 mg twice a day.  Use MiraLax (over the counter) for constipation as needed.   Constipation Prevention   Complete by: As directed    Drink plenty of fluids.  Prune juice may be helpful.  You may use a stool softener, such as Colace (over the counter) 100 mg twice a day.  Use MiraLax (over the counter) for constipation as needed.   Diet - low sodium heart healthy   Complete by: As directed    Increase activity slowly as tolerated   Complete by: As directed    Increase activity slowly as tolerated   Complete by: As directed    Negative Pressure Wound Therapy - Incisional   Complete by: As directed    Show patient  how to attach preveena vac   Negative Pressure Wound Therapy - Incisional   Complete by: As directed    Show patient how to attach preveena vac   Post-operative opioid taper instructions:   Complete by: As directed    POST-OPERATIVE OPIOID TAPER INSTRUCTIONS: It is important to wean off of your opioid medication as soon as possible. If you do not need pain medication after your surgery it is ok to stop day one. Opioids include: Codeine, Hydrocodone(Norco, Vicodin), Oxycodone(Percocet, oxycontin) and hydromorphone amongst others.  Long term and even short term use of opiods can cause: Increased pain response Dependence Constipation Depression Respiratory depression And more.  Withdrawal symptoms can include Flu like symptoms Nausea, vomiting And more Techniques to manage these symptoms Hydrate well Eat regular healthy meals Stay active Use relaxation techniques(deep breathing, meditating, yoga) Do Not substitute Alcohol to help with tapering If you have been on opioids for less than two weeks and do not have pain than it is ok to stop all together.  Plan to wean off of opioids This plan should start within one week post op of your joint replacement. Maintain the same interval or time between taking each dose and first decrease the dose.  Cut the total daily intake of opioids by one tablet each day Next start to increase the time between doses. The last dose that should be eliminated is the evening dose.      Post-operative opioid taper instructions:   Complete by: As directed    POST-OPERATIVE OPIOID TAPER INSTRUCTIONS: It is important to wean off of your opioid medication as soon as possible. If you do not need pain medication after your surgery it is ok to stop day one. Opioids include: Codeine, Hydrocodone(Norco, Vicodin), Oxycodone(Percocet, oxycontin) and hydromorphone amongst others.  Long term and even short term use of opiods can cause: Increased pain  response Dependence Constipation Depression Respiratory depression And more.  Withdrawal symptoms  can include Flu like symptoms Nausea, vomiting And more Techniques to manage these symptoms Hydrate well Eat regular healthy meals Stay active Use relaxation techniques(deep breathing, meditating, yoga) Do Not substitute Alcohol to help with tapering If you have been on opioids for less than two weeks and do not have pain than it is ok to stop all together.  Plan to wean off of opioids This plan should start within one week post op of your joint replacement. Maintain the same interval or time between taking each dose and first decrease the dose.  Cut the total daily intake of opioids by one tablet each day Next start to increase the time between doses. The last dose that should be eliminated is the evening dose.          Follow-up Information     Basma Buchner, [place], [place] Follow up.   Specialty: Orthopedic Surgery Contact information: [address] [place] [address] [phone]         [facility] Follow up.   Contact information: [address] [place] [address] [phone]                  Signed: [place] Harold Moncus 03/08/2021, 1:05 PM

## 2021-03-09 ENCOUNTER — Telehealth: Payer: Self-pay | Admitting: Orthopedic Surgery

## 2021-03-09 NOTE — Telephone Encounter (Signed)
I believe this is Dr. Burna Sis pt.

## 2021-03-09 NOTE — Telephone Encounter (Signed)
Marylene Land (RN) called from [facility] called for verbal nursing orders for pt 1X a wk. And if and orders for wound care of stump. Marylene Land phone number is [phone]. If unable to answer please leave detailed message on secure line.

## 2021-03-09 NOTE — Progress Notes (Signed)
Remote ICD transmission.   

## 2021-03-10 LAB — GLUCOSE, CAPILLARY: Glucose-Capillary: 44 mg/dL — CL (ref 70–99)

## 2021-03-10 NOTE — Telephone Encounter (Signed)
I called and sw HHN and advised that he was just d/c from hospital on 03/08/21 and that I have sent a message to the front desk to sch appt on Monday for eval. Light checks are on and vac not alarming and states that other than not being compliant with medication (HHN will contact PCP to discuss this not taking new ordered insulin or plavix) advised will update orders after visit and fax to [phone] per request from [facility].

## 2021-03-10 NOTE — Progress Notes (Deleted)
Electrophysiology Office Note Date: 03/10/2021  ID:  Richard [name], DOB 1961-05-09, MRN [id number]  PCP: Ananias Pilgrim, MD Primary Cardiologist: None Electrophysiologist: Sherryl Manges, MD   CC: Routine ICD follow-up  Richard [name] is a 60 y.o. male seen today for Sherryl Manges, MD for routine electrophysiology followup.  Since last being seen in our clinic the patient reports doing ***.  he denies chest pain, palpitations, dyspnea, PND, orthopnea, nausea, vomiting, dizziness, syncope, edema, weight gain, or early satiety. {He/she (caps):30048} has not had ICD shocks.   Device History: Medtronic Dual Chamber ICD implanted 2018 for primary prevention   Past Medical History:  Diagnosis Date   AICD (automatic cardioverter/defibrillator) present 01/11/2017   Anxiety    CHF (congestive heart failure) (HCC)    CKD (chronic kidney disease)    COPD (chronic obstructive pulmonary disease) (HCC)    never smoker, industrial exposure   Diabetes mellitus with diabetic neuropathy, with long-term current use of insulin (HCC)    GERD (gastroesophageal reflux disease)    High cholesterol    History of gout    "take RX qd" (11/01/2017)   Hypertension    Nonobstructive atherosclerosis of coronary artery    On home oxygen therapy    "2L prn" (11/01/2017)   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HCC)    LLL, present for years   Systolic HF (heart failure) (HCC)    Past Surgical History:  Procedure Laterality Date   ABDOMINAL AORTOGRAM W/LOWER EXTREMITY N/A 02/01/2021   Procedure: ABDOMINAL AORTOGRAM W/LOWER EXTREMITY;  Surgeon: Nada Libman, MD;  [facility];  Service: Cardiovascular;  Laterality: N/A;   AMPUTATION Left 01/21/2021   Procedure: AMPUTATION BELOW KNEE;  Surgeon: Nadara Mustard, MD;  Location: [facility] OR;  Service: Orthopedics;  Laterality: Left;   AMPUTATION Right 02/04/2021   Procedure: RIGHT FOOT 1ST AND 2ND RAY AMPUTATION;  Surgeon: Nadara Mustard, MD;  Location: [facility] OR;  Service:  Orthopedics;  Laterality: Right;   AMPUTATION Right 03/02/2021   Procedure: RIGHT BELOW KNEE AMPUTATION;  Surgeon: Nadara Mustard, MD;  Location: [facility] OR;  Service: Orthopedics;  Laterality: Right;   APPLICATION OF WOUND VAC Left 01/21/2021   Procedure: APPLICATION OF WOUND VAC;  Surgeon: Nadara Mustard, MD;  [facility] OR;  Service: Orthopedics;  Laterality: Left;   APPLICATION OF WOUND VAC  02/04/2021   Procedure: APPLICATION OF WOUND VAC;  Surgeon: Nadara Mustard, MD;  Location: [facility] OR;  Service: Orthopedics;;   APPLICATION OF WOUND VAC  03/02/2021   Procedure: APPLICATION OF WOUND VAC;  Surgeon: Nadara Mustard, MD;  [facility] OR;  Service: Orthopedics;;   CATARACT EXTRACTION W/ INTRAOCULAR LENS IMPLANTW/ TRABECULECTOMY Left ~ 2013   "may have done this when I had my other eye OR"   CIRCUMCISION     CORONARY STENT INTERVENTION N/A 11/02/2017   Procedure: CORONARY STENT INTERVENTION;  Surgeon: Laurey Morale, MD;  [facility];  Service: Cardiovascular;  Laterality: N/A;   CORONARY STENT INTERVENTION N/A 11/02/2017   Procedure: CORONARY STENT INTERVENTION;  Surgeon: Lyn Records, MD;  [facility];  Service: Cardiovascular;  Laterality: N/A;   EYE SURGERY Left ~ 2013   "fell on something in basement; stuck in my eye"   ICD IMPLANT     Medtronic Dual Chamber 01/11/17   MULTIPLE TOOTH EXTRACTIONS     "pulled all my top teeth"   PERIPHERAL VASCULAR BALLOON ANGIOPLASTY  02/01/2021  Procedure: PERIPHERAL VASCULAR BALLOON ANGIOPLASTY;  Surgeon: Nada Libman, MD;  [facility];  Service: Cardiovascular;;  TP Trunk / PT   RIGHT/LEFT HEART CATH AND CORONARY ANGIOGRAPHY N/A 11/02/2017   Procedure: RIGHT/LEFT HEART CATH AND CORONARY ANGIOGRAPHY;  Surgeon: Laurey Morale, MD;  Location: [facility] INVASIVE CV LAB;  Service: Cardiovascular;  Laterality: N/A;   TEE WITHOUT CARDIOVERSION N/A 01/20/2021   Procedure: TRANSESOPHAGEAL ECHOCARDIOGRAM (TEE);  Surgeon:  Laurey Morale, MD;  Location: [facility] ENDOSCOPY;  Service: Cardiovascular;  Laterality: N/A;    Current Outpatient Medications  Medication Sig Dispense Refill   albuterol (VENTOLIN HFA) 108 (90 Base) MCG/ACT inhaler Inhale 1-2 puffs into the lungs every 4 (four) hours as needed for wheezing or shortness of breath. 8 g 1   allopurinol (ZYLOPRIM) 100 MG tablet Take 1 tablet (100 mg total) by mouth daily. 30 tablet 3   ascorbic acid (VITAMIN C) 1000 MG tablet Take 1 tablet (1,000 mg total) by mouth daily. 30 tablet 0   aspirin 81 MG chewable tablet Chew 81 mg by mouth daily.     atorvastatin (LIPITOR) 40 MG tablet One tab daily (Patient taking differently: Take 40 mg by mouth daily.) 90 tablet 0   bisoprolol (ZEBETA) 5 MG tablet Take 0.5 tablets (2.5 mg total) by mouth daily. 30 tablet 0   BREO ELLIPTA 100-25 MCG/INH AEPB Inhale 1 puff into the lungs daily. 1 each 1   busPIRone (BUSPAR) 5 MG tablet Take 1 tablet (5 mg total) by mouth 2 (two) times daily. 60 tablet 0   clopidogrel (PLAVIX) 75 MG tablet Take 1 tablet (75 mg total) by mouth daily. 30 tablet 2   diclofenac Sodium (VOLTAREN) 1 % GEL Apply 2 g topically 4 (four) times daily. (Patient taking differently: Apply 2 g topically 2 (two) times daily as needed (For pain).) 2 g 0   digoxin (LANOXIN) 0.125 MG tablet Take 125 mcg by mouth daily.     docusate sodium (COLACE) 100 MG capsule Take 1 capsule (100 mg total) by mouth daily. 10 capsule 0   DULoxetine (CYMBALTA) 60 MG capsule Take 1 capsule (60 mg total) by mouth daily. 30 capsule 2   empagliflozin (JARDIANCE) 10 MG TABS tablet Take 1 tablet (10 mg total) by mouth daily. 30 tablet 0   eplerenone (INSPRA) 50 MG tablet Take 50 mg by mouth daily.     fenofibrate (TRICOR) 145 MG tablet Take 1 tablet (145 mg total) by mouth daily. 90 tablet 0   HUMULIN R U-500 KWIKPEN 500 UNIT/ML KwikPen Inject 5 Units into the skin daily.     hydrALAZINE (APRESOLINE) 50 MG tablet Take 75 mg by mouth every 8  (eight) hours.     insulin glargine-yfgn (SEMGLEE, YFGN,) 100 UNIT/ML injection Inject 0.08 mLs (8 Units total) into the skin 2 (two) times daily. 10 mL 11   isosorbide mononitrate (IMDUR) 30 MG 24 hr tablet Take 1 tablet (30 mg total) by mouth daily. 30 tablet 0   MAGNESIUM-OXIDE 400 (241.3 Mg) MG tablet Take 1 tablet (400 mg total) by mouth daily. 30 tablet 11   melatonin 3 MG TABS tablet Take 1 tablet (3 mg total) by mouth at bedtime. 30 tablet 0   Multiple Vitamin (MULTIVITAMIN WITH MINERALS) TABS tablet Take 1 tablet by mouth daily.     nutrition supplement, JUVEN, (JUVEN) PACK Take 1 packet by mouth 2 (two) times daily between meals.  0   oxyCODONE (OXY IR/ROXICODONE) 5 MG immediate release  tablet Take 1-2 tablets (5-10 mg total) by mouth every 4 (four) hours as needed for moderate pain (pain score 4-6). 30 tablet 0   polyethylene glycol (MIRALAX / GLYCOLAX) 17 g packet Take 17 g by mouth daily as needed for mild constipation.  0   pregabalin (LYRICA) 150 MG capsule Take 150 mg by mouth 3 (three) times daily.     sacubitril-valsartan (ENTRESTO) 24-26 MG Take 1 tablet by mouth 2 (two) times daily. 60 tablet 0   spironolactone (ALDACTONE) 25 MG tablet Take 0.5 tablets (12.5 mg total) by mouth daily. 30 tablet 0   torsemide (DEMADEX) 20 MG tablet Take 20 mg by mouth 2 (two) times daily.     Torsemide 40 MG TABS Take 40 mg by mouth daily. 30 tablet 0   zinc sulfate 220 (50 Zn) MG capsule Take 1 capsule (220 mg total) by mouth daily. 30 capsule 0   No current facility-administered medications for this visit.    Allergies:   Aspirin, Codeine, and Coconut oil   Social History: Social History   Socioeconomic History   Marital status: Divorced    Spouse name: Not on file   Number of children: Not on file   Years of education: Not on file   Highest education level: Not on file  Occupational History   Occupation: disability    Comment: stopped working in 2000 d/t occupational exposures   Tobacco Use   Smoking status: Former    Types: Cigarettes   Smokeless tobacco: Never   Tobacco comments:    "smoked when I drank"  Vaping Use   Vaping Use: Never used  Substance and Sexual Activity   Alcohol use: Not Currently    Comment: 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  Other Topics Concern   Not on file  Social History Narrative   Patient lives at home with wife and some of his 10 children. He self-administers his own medications.   Social Determinants of Health   Financial Resource Strain: Low Risk    Difficulty of Paying Living Expenses: Not very hard  Food Insecurity: No Food Insecurity   Worried About Programme researcher, broadcasting/film/video in the Last Year: Never true   Ran Out of Food in the Last Year: Never true  Transportation Needs: No Transportation Needs   Lack of Transportation (Medical): No   Lack of Transportation (Non-Medical): No  Physical Activity: Not on file  Stress: Not on file  Social Connections: Not on file  Intimate Partner Violence: Not on file    Family History: Family History  Problem Relation Age of Onset   Hypertension Mother    Diabetes Mother    Hypertension Father    Diabetes Father    Diabetes Sister    Diabetes Brother     Review of Systems: All other systems reviewed and are otherwise negative except as noted above.   Physical Exam: There were no vitals filed for this visit.   GEN- The patient is well appearing, alert and oriented x 3 today.   HEENT: normocephalic, atraumatic; sclera clear, conjunctiva pink; hearing intact; oropharynx clear; neck supple, no JVP Lymph- no cervical lymphadenopathy Lungs- Clear to ausculation bilaterally, normal work of breathing.  No wheezes, rales, rhonchi Heart- Regular rate and rhythm, no murmurs, rubs or gallops, PMI not laterally displaced GI- soft, non-tender, non-distended, bowel sounds present, no hepatosplenomegaly Extremities-  no clubbing or cyanosis. No  edema; DP/PT/radial pulses 2+ bilaterally MS- no significant deformity or atrophy Skin- warm and dry, no rash or lesion; ICD pocket well healed Psych- euthymic mood, full affect Neuro- strength and sensation are intact  ICD interrogation- reviewed in detail today,  See PACEART report  EKG:  EKG is not ordered today.  Recent Labs: 01/15/2021: B Natriuretic Peptide 532.8 01/16/2021: Magnesium 2.7; TSH 0.473 02/08/2021: ALT 25 03/04/2021: BUN 26; Creatinine, Ser 1.13; Hemoglobin 8.3; Platelets 482; Potassium 4.5; Sodium 137   Wt Readings from Last 3 Encounters:  03/01/21 240 lb (108.9 kg)  02/14/21 226 lb 13.7 oz (102.9 kg)  02/06/21 235 lb 0.2 oz (106.6 kg)     Other studies Reviewed: Additional studies/ records that were reviewed today include: Previous EP office notes.   Assessment and Plan:  1.  Chronic systolic dysfunction s/p Medtronic dual chamber ICD  euvolemic today Stable on an appropriate medical regimen Normal ICD function See Pace Art report No changes today 2. COPD: Through significant occupational exposure.  It appears that he won a lawsuit dealing with the occupational exposure-related COPD.  Continue chronic O2.  3. CAD: - LHC 5/20: 80-90% proximal RCA stenosis with DES to RCA.  This did not cause his cardiomyopathy but is a large RCA. Denies s/s ischemia     - Continue plavix - Continue atorvastatin  4. OSA - As above, refused sleep study 5. DM - Continue jardiance.  - Per PCP  6. 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   The patient {ACTIONS; HAS/DOES NOT HAVE:19233} concerns regarding his medicines.  The following changes were made today:  {NONE DEFAULTED:18576}  Labs/ tests ordered today include: *** No orders of the defined types were placed in this encounter.    Disposition:   Follow up with {Blank single:19197::"Dr. Allred","Dr.  Amada Jupiter. Klein","Dr. Camnitz","Dr. Lambert","EP APP"} in {gen number 1-19:417408} {TIME; UNITS DAY/WEEK/MONTH:19136}   Signed, Graciella Freer, PA-C  03/10/2021 3:27 PM  [facility] [address] [place] [address] [phone] (office) [phone] (fax)

## 2021-03-11 ENCOUNTER — Encounter: Payer: Medicare Other | Admitting: Student

## 2021-03-11 DIAGNOSIS — I2510 Atherosclerotic heart disease of native coronary artery without angina pectoris: Secondary | ICD-10-CM

## 2021-03-11 DIAGNOSIS — G4733 Obstructive sleep apnea (adult) (pediatric): Secondary | ICD-10-CM

## 2021-03-11 DIAGNOSIS — I428 Other cardiomyopathies: Secondary | ICD-10-CM

## 2021-03-14 ENCOUNTER — Encounter (HOSPITAL_COMMUNITY): Payer: Medicare Other

## 2021-03-15 ENCOUNTER — Telehealth: Payer: Self-pay

## 2021-03-15 NOTE — Telephone Encounter (Signed)
Richard [name], PT from [facility] called stating that she want for an evaluate and PT and patient agreed no further visits needed. Patient is doing well.

## 2021-03-16 ENCOUNTER — Telehealth: Payer: Self-pay | Admitting: Physical Medicine and Rehabilitation

## 2021-03-16 ENCOUNTER — Telehealth: Payer: Self-pay

## 2021-03-16 NOTE — Telephone Encounter (Signed)
Richard [name] OT with [facility] needs to get verbal orders to see patient 1w4.  You can leave a message on her voicemail [phone].

## 2021-03-16 NOTE — Telephone Encounter (Signed)
Richard [name] was a no-show for his hospital follow up here at [facility]. He has no future appointment. Ladona Ridgel Occupational Therapist with [facility] is seeking orders. Once a week for four weeks.  Please advise Dr. Berline Chough is out of the office this week.   Call back phone [phone].

## 2021-03-17 ENCOUNTER — Ambulatory Visit (INDEPENDENT_AMBULATORY_CARE_PROVIDER_SITE_OTHER): Payer: Medicare Other | Admitting: Orthopedic Surgery

## 2021-03-17 ENCOUNTER — Encounter: Payer: Self-pay | Admitting: Orthopedic Surgery

## 2021-03-17 ENCOUNTER — Telehealth (HOSPITAL_COMMUNITY): Payer: Self-pay

## 2021-03-17 ENCOUNTER — Encounter (HOSPITAL_COMMUNITY): Payer: Self-pay

## 2021-03-17 DIAGNOSIS — Z89512 Acquired absence of left leg below knee: Secondary | ICD-10-CM

## 2021-03-17 DIAGNOSIS — Z89511 Acquired absence of right leg below knee: Secondary | ICD-10-CM

## 2021-03-17 NOTE — Progress Notes (Signed)
Paramedicine Encounter    Patient ID: Richard [name], male    DOB: 11/24/1960, 59 y.o.   MRN: [id number]   Confirmed discharge with PA Amy Clegg.  [facility]  Patient has/has not met the following goals:  Yes :Patient expresses basic understanding of medications and what they are for Yes :Patient able to verbalize heart failure specific dietary/fluid restrictions Yes :Patient is aware of who to call if they have medical concerns or if they need to schedule or change appts Yes :Patient has a scale for daily weights and weighs regularly Yes :Patient able to verbalize concerning symptoms when they should call the HF clinic (weight gain ranges, etc) Yes :Patient has a PCP and has seen within the past year or has upcoming appt Yes :Patient has reliable access to getting their medications Yes :Patient has shown they are able to reorder medications reliably Yes :Patient has had admission in past 30 days- if yes how many? Yes :Patient has had admission in past 90 days- if yes how many? Due to wounds on extremities/ left and right BKA.   Discharge Comments: Unable to reach by in person visit, phone and text messages. D/C effective today at 11:00.       ACTION: Home visit completed       

## 2021-03-17 NOTE — Progress Notes (Signed)
Office Visit Note   Patient: Richard [name]           Date of Birth: 07/10/1960           MRN: [id number] Visit Date: 03/17/2021              Requested by: Ananias Pilgrim, MD [address] [place],  [place] [address] PCP: Ananias Pilgrim, MD  Chief Complaint  Patient presents with   Right Knee - Routine Post Op, Pain    DOS: 03/02/2021      HPI: Patient is a 60 year old gentleman who presents 2 weeks status post right transtibial amputation and 8 weeks status post left transtibial amputation.  The wound VAC is in place.  Assessment & Plan: Visit Diagnoses:  1. S/P BKA (below knee amputation), left (HCC)   2. S/P BKA (below knee amputation), right (HCC)     Plan: Patient is given a prescription for [employer] for a size 3 extra-large stump shrinker for both lower extremities.  He is given a prescription for bilateral K3 prosthesis.  We will harvest staples and follow-up in 2 weeks.  Follow-Up Instructions: Return in about 2 weeks (around 03/31/2021).   Ortho Exam  Patient is alert, oriented, no adenopathy, well-dressed, normal affect, normal respiratory effort. Examination the right transtibial amputation is healing well the wound VAC was removed this was the original dressing.  There is no redness no cellulitis no drainage no signs of infection.  Patient is a new bilateral transtibial  amputee.  [name] current comorbidities are not expected to impact the ability to function with the prescribed prosthesis. Patient verbally communicates a strong desire to use a prosthesis. Patient currently requires mobility aids to ambulate without a prosthesis.  Expects not to use mobility aids with a new prosthesis.  Patient is a K3 level ambulator that spends a lot of time walking around on uneven terrain over obstacles, up and down stairs, and ambulates with a variable cadence.     Imaging: No results found.   Labs: Lab Results  Component Value Date   HGBA1C 10.3  (H) 01/17/2021   HGBA1C 10.4 (H) 10/31/2017   LABURIC 8.2 09/22/2019   REPTSTATUS 01/22/2021 FINAL 01/16/2021   CULT  01/16/2021    NO GROWTH 6 DAYS Performed at [facility], [address], [place], [place] [address]    [facility] STREPTOCOCCUS GROUP G 01/15/2021     Lab Results  Component Value Date   ALBUMIN 1.8 (L) 02/08/2021   ALBUMIN 1.7 (L) 01/28/2021   ALBUMIN 2.0 (L) 01/21/2021   PREALBUMIN 8.5 (L) 01/16/2021    Lab Results  Component Value Date   MG 2.7 (H) 01/16/2021   MG 2.5 (H) 01/15/2021   MG 2.2 10/18/2020   No results found for: [facility]  Lab Results  Component Value Date   PREALBUMIN 8.5 (L) 01/16/2021   CBC EXTENDED Latest Ref Rng & Units 03/04/2021 03/03/2021 02/08/2021  WBC 4.0 - 10.5 K/uL 15.1(H) 15.1(H) 8.7  RBC 4.22 - 5.81 MIL/uL 2.92(L) 3.29(L) 3.82(L)  HGB 13.0 - 17.0 g/dL 8.3(L) 9.2(L) 10.8(L)  HCT 39.0 - 52.0 % 27.0(L) 30.2(L) 34.1(L)  PLT 150 - 400 K/uL 482(H) 470(H) 242  NEUTROABS 1.7 - 7.7 K/uL - - 6.1  LYMPHSABS 0.7 - 4.0 K/uL - - 1.4     There is no height or weight on file to calculate BMI.  Orders:  No orders of the defined types were placed in this encounter.  No orders  of the defined types were placed in this encounter.    Procedures: No procedures performed  Clinical Data: No additional findings.  ROS:  All other systems negative, except as noted in the HPI. Review of Systems  Objective: Vital Signs: There were no vitals taken for this visit.  Specialty Comments:  No specialty comments available.  PMFS History: Patient Active Problem List   Diagnosis Date Noted   Wound dehiscence 03/02/2021   Subacute osteomyelitis, right ankle and foot (HCC)    Sleep disturbance    Essential hypertension    Diabetic peripheral neuropathy (HCC)    Labile blood glucose    Left below-knee amputee (HCC) 02/07/2021   PAD (peripheral artery disease) (HCC) 02/01/2021   Below-knee amputation of left lower extremity (HCC)  01/27/2021   S/P BKA (below knee amputation), left (HCC)    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HCC)    SOB (shortness of breath)    Open wound    Bacteremia    Chronic ulcer of left ankle (HCC)    Severe sepsis without septic shock (HCC)    COPD exacerbation (HCC) 01/15/2021   Chronic systolic CHF (congestive heart failure) (HCC) 01/15/2021   Acute on chronic respiratory failure with hypoxia and hypercapnia (HCC) 01/15/2021   Acute renal failure superimposed on stage 3 chronic kidney disease (HCC) 01/15/2021   Elevated troponin 01/15/2021   Hyponatremia 01/15/2021   CHF exacerbation (HCC) 10/17/2020   Acute on chronic systolic CHF (congestive heart failure) (HCC) 10/16/2020   Acute on chronic combined systolic and diastolic CHF (congestive heart failure) (HCC) 03/15/2018   S/P right and left heart catheterization 11/02/2017   Panic attacks 11/02/2017   Non-STEMI (non-ST elevated myocardial infarction) (HCC)    COPD (chronic obstructive pulmonary disease) (HCC)    Hypertension    CKD (chronic kidney disease)    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HCC)    Presence of implantable cardioverter-defibrillator (ICD)    GERD (gastroesophageal reflux disease)    Diabetes mellitus with diabetic neuropathy, with long-term current use of insulin (HCC)    Past Medical History:  Diagnosis Date   AICD (automatic cardioverter/defibrillator) present 01/11/2017   Anxiety    CHF (congestive heart failure) (HCC)    CKD (chronic kidney disease)    COPD (chronic obstructive pulmonary disease) (HCC)    never smoker, industrial exposure   Diabetes mellitus with diabetic neuropathy, with long-term current use of insulin (HCC)    GERD (gastroesophageal reflux disease)    High cholesterol    History of gout    "take RX qd" (11/01/2017)   Hypertension    Nonobstructive atherosclerosis of coronary artery    On home oxygen therapy    "2L prn" (11/01/2017)   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HCC)    LLL, present for years    Systolic HF (heart failure) (HCC)     Family History  Problem Relation Age of Onset   Hypertension Mother    Diabetes Mother    Hypertension Father    Diabetes Father    Diabetes Sister    Diabetes Brother     Past Surgical History:  Procedure Laterality Date   ABDOMINAL AORTOGRAM W/LOWER EXTREMITY N/A 02/01/2021   Procedure: ABDOMINAL AORTOGRAM W/LOWER EXTREMITY;  Surgeon: Nada Libman, MD;  [facility];  Service: Cardiovascular;  Laterality: N/A;   AMPUTATION Left 01/21/2021   Procedure: AMPUTATION BELOW KNEE;  Surgeon: Nadara Mustard, MD;  Location: [facility] OR;  Service: Orthopedics;  Laterality: Left;  AMPUTATION Right 02/04/2021   Procedure: RIGHT FOOT 1ST AND 2ND RAY AMPUTATION;  Surgeon: Nadara Mustard, MD;  Location: [facility] OR;  Service: Orthopedics;  Laterality: Right;   AMPUTATION Right 03/02/2021   Procedure: RIGHT BELOW KNEE AMPUTATION;  Surgeon: Nadara Mustard, MD;  Location: [facility] OR;  Service: Orthopedics;  Laterality: Right;   APPLICATION OF WOUND VAC Left 01/21/2021   Procedure: APPLICATION OF WOUND VAC;  Surgeon: Nadara Mustard, MD;  [facility] OR;  Service: Orthopedics;  Laterality: Left;   APPLICATION OF WOUND VAC  02/04/2021   Procedure: APPLICATION OF WOUND VAC;  Surgeon: Nadara Mustard, MD;  Location: [facility] OR;  Service: Orthopedics;;   APPLICATION OF WOUND VAC  03/02/2021   Procedure: APPLICATION OF WOUND VAC;  Surgeon: Nadara Mustard, MD;  [facility] OR;  Service: Orthopedics;;   CATARACT EXTRACTION W/ INTRAOCULAR LENS IMPLANTW/ TRABECULECTOMY Left ~ 2013   "may have done this when I had my other eye OR"   CIRCUMCISION     CORONARY STENT INTERVENTION N/A 11/02/2017   Procedure: CORONARY STENT INTERVENTION;  Surgeon: Laurey Morale, MD;  [facility];  Service: Cardiovascular;  Laterality: N/A;   CORONARY STENT INTERVENTION N/A 11/02/2017   Procedure: CORONARY STENT INTERVENTION;  Surgeon: Lyn Records, MD;  [facility] LAB;   Service: Cardiovascular;  Laterality: N/A;   EYE SURGERY Left ~ 2013   "fell on something in basement; stuck in my eye"   ICD IMPLANT     Medtronic Dual Chamber 01/11/17   MULTIPLE TOOTH EXTRACTIONS     "pulled all my top teeth"   PERIPHERAL VASCULAR BALLOON ANGIOPLASTY  02/01/2021   Procedure: PERIPHERAL VASCULAR BALLOON ANGIOPLASTY;  Surgeon: Nada Libman, MD;  [facility] LAB;  Service: Cardiovascular;;  TP Trunk / PT   RIGHT/LEFT HEART CATH AND CORONARY ANGIOGRAPHY N/A 11/02/2017   Procedure: RIGHT/LEFT HEART CATH AND CORONARY ANGIOGRAPHY;  Surgeon: Laurey Morale, MD;  Location: [facility] INVASIVE CV LAB;  Service: Cardiovascular;  Laterality: N/A;   TEE WITHOUT CARDIOVERSION N/A 01/20/2021   Procedure: TRANSESOPHAGEAL ECHOCARDIOGRAM (TEE);  Surgeon: Laurey Morale, MD;  Location: [facility] ENDOSCOPY;  Service: Cardiovascular;  Laterality: N/A;   Social History   Occupational History   Occupation: disability    Comment: stopped working in 2000 d/t occupational exposures  Tobacco Use   Smoking status: Former    Types: Cigarettes   Smokeless tobacco: Never   Tobacco comments:    "smoked when I drank"  Vaping Use   Vaping Use: Never used  Substance and Sexual Activity   Alcohol use: Not Currently    Comment: 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

## 2021-03-17 NOTE — Telephone Encounter (Signed)
Attempted to reach Mr. Brizzi for paramedicine visit with no success. We will re-evaluate with LCSW Eileen Stanford and HF clinic staff regarding continuing paramedicine.

## 2021-03-17 NOTE — Telephone Encounter (Signed)
Dr. Riley Kill reply left on [facility]) PT voicemail.

## 2021-03-17 NOTE — Telephone Encounter (Signed)
Orders approved and given. 

## 2021-03-18 ENCOUNTER — Encounter (HOSPITAL_COMMUNITY): Payer: Medicare Other

## 2021-03-18 ENCOUNTER — Inpatient Hospital Stay (HOSPITAL_COMMUNITY): Admission: RE | Admit: 2021-03-18 | Payer: Medicare Other | Source: Ambulatory Visit

## 2021-03-21 ENCOUNTER — Other Ambulatory Visit (HOSPITAL_COMMUNITY): Payer: Self-pay | Admitting: Cardiology

## 2021-03-23 ENCOUNTER — Other Ambulatory Visit (HOSPITAL_COMMUNITY): Payer: Self-pay | Admitting: Cardiology

## 2021-03-23 NOTE — Progress Notes (Signed)
Electrophysiology Office Note Date: 03/24/2021  ID:  Richard [name], DOB Apr 23, 1961, MRN [id number]  PCP: [name] Catalina, PA-C Primary Cardiologist: None Electrophysiologist: Sherryl Manges, MD   CC: Routine ICD follow-up  Richard [name] is a 60 y.o. male seen today for Sherryl Manges, MD for routine electrophysiology followup.  Since last being seen in our clinic the patient reports doing well from a cardiac perspective. He recently had bilateral BKA.  he denies chest pain, palpitations, dyspnea, PND, orthopnea, nausea, vomiting, dizziness, syncope, edema, weight gain, or early satiety. He has not had ICD shocks.   Device History: Medtronic Dual Chamber ICD implanted 2018 for CHF   Past Medical History:  Diagnosis Date   AICD (automatic cardioverter/defibrillator) present 01/11/2017   Anxiety    CHF (congestive heart failure) (HCC)    CKD (chronic kidney disease)    COPD (chronic obstructive pulmonary disease) (HCC)    never smoker, industrial exposure   Diabetes mellitus with diabetic neuropathy, with long-term current use of insulin (HCC)    GERD (gastroesophageal reflux disease)    High cholesterol    History of gout    "take RX qd" (11/01/2017)   Hypertension    Nonobstructive atherosclerosis of coronary artery    On home oxygen therapy    "2L prn" (11/01/2017)   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HCC)    LLL, present for years   Systolic HF (heart failure) (HCC)    Past Surgical History:  Procedure Laterality Date   ABDOMINAL AORTOGRAM W/LOWER EXTREMITY N/A 02/01/2021   Procedure: ABDOMINAL AORTOGRAM W/LOWER EXTREMITY;  Surgeon: Nada Libman, MD;  [facility];  Service: Cardiovascular;  Laterality: N/A;   AMPUTATION Left 01/21/2021   Procedure: AMPUTATION BELOW KNEE;  Surgeon: Nadara Mustard, MD;  Location: [facility] OR;  Service: Orthopedics;  Laterality: Left;   AMPUTATION Right 02/04/2021   Procedure: RIGHT FOOT 1ST AND 2ND RAY AMPUTATION;  Surgeon: Nadara Mustard, MD;   Location: [facility] OR;  Service: Orthopedics;  Laterality: Right;   AMPUTATION Right 03/02/2021   Procedure: RIGHT BELOW KNEE AMPUTATION;  Surgeon: Nadara Mustard, MD;  Location: [facility] OR;  Service: Orthopedics;  Laterality: Right;   APPLICATION OF WOUND VAC Left 01/21/2021   Procedure: APPLICATION OF WOUND VAC;  Surgeon: Nadara Mustard, MD;  [facility] OR;  Service: Orthopedics;  Laterality: Left;   APPLICATION OF WOUND VAC  02/04/2021   Procedure: APPLICATION OF WOUND VAC;  Surgeon: Nadara Mustard, MD;  Location: [facility] OR;  Service: Orthopedics;;   APPLICATION OF WOUND VAC  03/02/2021   Procedure: APPLICATION OF WOUND VAC;  Surgeon: Nadara Mustard, MD;  [facility] OR;  Service: Orthopedics;;   CATARACT EXTRACTION W/ INTRAOCULAR LENS IMPLANTW/ TRABECULECTOMY Left ~ 2013   "may have done this when I had my other eye OR"   CIRCUMCISION     CORONARY STENT INTERVENTION N/A 11/02/2017   Procedure: CORONARY STENT INTERVENTION;  Surgeon: Laurey Morale, MD;  [facility];  Service: Cardiovascular;  Laterality: N/A;   CORONARY STENT INTERVENTION N/A 11/02/2017   Procedure: CORONARY STENT INTERVENTION;  Surgeon: Lyn Records, MD;  [facility];  Service: Cardiovascular;  Laterality: N/A;   EYE SURGERY Left ~ 2013   "fell on something in basement; stuck in my eye"   ICD IMPLANT     Medtronic Dual Chamber 01/11/17   MULTIPLE TOOTH EXTRACTIONS     "pulled all my top teeth"  PERIPHERAL VASCULAR BALLOON ANGIOPLASTY  02/01/2021   Procedure: PERIPHERAL VASCULAR BALLOON ANGIOPLASTY;  Surgeon: Nada Libman, MD;  [facility];  Service: Cardiovascular;;  TP Trunk / PT   RIGHT/LEFT HEART CATH AND CORONARY ANGIOGRAPHY N/A 11/02/2017   Procedure: RIGHT/LEFT HEART CATH AND CORONARY ANGIOGRAPHY;  Surgeon: Laurey Morale, MD;  Location: [facility] INVASIVE CV LAB;  Service: Cardiovascular;  Laterality: N/A;   TEE WITHOUT CARDIOVERSION N/A 01/20/2021   Procedure: TRANSESOPHAGEAL  ECHOCARDIOGRAM (TEE);  Surgeon: Laurey Morale, MD;  Location: [facility] ENDOSCOPY;  Service: Cardiovascular;  Laterality: N/A;    Current Outpatient Medications  Medication Sig Dispense Refill   albuterol (VENTOLIN HFA) 108 (90 Base) MCG/ACT inhaler Inhale 1-2 puffs into the lungs every 4 (four) hours as needed for wheezing or shortness of breath. 8 g 1   allopurinol (ZYLOPRIM) 100 MG tablet Take 1 tablet (100 mg total) by mouth daily. 30 tablet 3   ascorbic acid (VITAMIN C) 1000 MG tablet Take 1 tablet (1,000 mg total) by mouth daily. 30 tablet 0   aspirin 81 MG chewable tablet Chew 81 mg by mouth daily.     atorvastatin (LIPITOR) 40 MG tablet One tab daily (Patient taking differently: Take 40 mg by mouth daily.) 90 tablet 0   bisoprolol (ZEBETA) 5 MG tablet Take 0.5 tablets (2.5 mg total) by mouth daily. 30 tablet 0   BREO ELLIPTA 100-25 MCG/INH AEPB Inhale 1 puff into the lungs daily. 1 each 1   busPIRone (BUSPAR) 5 MG tablet Take 1 tablet (5 mg total) by mouth 2 (two) times daily. 60 tablet 0   clopidogrel (PLAVIX) 75 MG tablet Take 1 tablet (75 mg total) by mouth daily. 30 tablet 2   diclofenac Sodium (VOLTAREN) 1 % GEL Apply 2 g topically 4 (four) times daily. (Patient taking differently: Apply 2 g topically 2 (two) times daily as needed (For pain).) 2 g 0   digoxin (LANOXIN) 0.125 MG tablet Take 125 mcg by mouth daily.     docusate sodium (COLACE) 100 MG capsule Take 1 capsule (100 mg total) by mouth daily. 10 capsule 0   DULoxetine (CYMBALTA) 60 MG capsule Take 1 capsule (60 mg total) by mouth daily. 30 capsule 2   empagliflozin (JARDIANCE) 10 MG TABS tablet Take 1 tablet (10 mg total) by mouth daily. 30 tablet 0   eplerenone (INSPRA) 50 MG tablet TAKE ONE TABLET BY MOUTH DAILY 30 tablet 5   fenofibrate (TRICOR) 145 MG tablet Take 1 tablet (145 mg total) by mouth daily. 90 tablet 0   HUMULIN R U-500 KWIKPEN 500 UNIT/ML KwikPen Inject 5 Units into the skin daily.     hydrALAZINE (APRESOLINE)  50 MG tablet Take 75 mg by mouth every 8 (eight) hours.     insulin glargine-yfgn (SEMGLEE, YFGN,) 100 UNIT/ML injection Inject 0.08 mLs (8 Units total) into the skin 2 (two) times daily. 10 mL 11   isosorbide mononitrate (IMDUR) 30 MG 24 hr tablet Take 1 tablet (30 mg total) by mouth daily. 30 tablet 0   MAGNESIUM-OXIDE 400 (241.3 Mg) MG tablet Take 1 tablet (400 mg total) by mouth daily. 30 tablet 11   melatonin 3 MG TABS tablet Take 1 tablet (3 mg total) by mouth at bedtime. 30 tablet 0   Multiple Vitamin (MULTIVITAMIN WITH MINERALS) TABS tablet Take 1 tablet by mouth daily.     nutrition supplement, JUVEN, (JUVEN) PACK Take 1 packet by mouth 2 (two) times daily between meals.  0  oxyCODONE (OXY IR/ROXICODONE) 5 MG immediate release tablet Take 1-2 tablets (5-10 mg total) by mouth every 4 (four) hours as needed for moderate pain (pain score 4-6). 30 tablet 0   polyethylene glycol (MIRALAX / GLYCOLAX) 17 g packet Take 17 g by mouth daily as needed for mild constipation.  0   pregabalin (LYRICA) 150 MG capsule Take 150 mg by mouth 3 (three) times daily.     sacubitril-valsartan (ENTRESTO) 24-26 MG Take 1 tablet by mouth 2 (two) times daily. 60 tablet 0   spironolactone (ALDACTONE) 25 MG tablet Take 0.5 tablets (12.5 mg total) by mouth daily. 30 tablet 0   torsemide (DEMADEX) 20 MG tablet Take 20 mg by mouth 2 (two) times daily.     Torsemide 40 MG TABS Take 40 mg by mouth daily. 30 tablet 0   zinc sulfate 220 (50 Zn) MG capsule Take 1 capsule (220 mg total) by mouth daily. 30 capsule 0   No current facility-administered medications for this visit.    Allergies:   Aspirin, Codeine, and Coconut oil   Social History: Social History   Socioeconomic History   Marital status: Divorced    Spouse name: Not on file   Number of children: Not on file   Years of education: Not on file   Highest education level: Not on file  Occupational History   Occupation: disability    Comment: stopped  working in 2000 d/t occupational exposures  Tobacco Use   Smoking status: Former    Types: Cigarettes   Smokeless tobacco: Never   Tobacco comments:    "smoked when I drank"  Vaping Use   Vaping Use: Never used  Substance and Sexual Activity   Alcohol use: Not Currently    Comment: 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  Other Topics Concern   Not on file  Social History Narrative   Patient lives at home with wife and some of his 10 children. He self-administers his own medications.   Social Determinants of Health   Financial Resource Strain: Low Risk    Difficulty of Paying Living Expenses: Not very hard  Food Insecurity: No Food Insecurity   Worried About Programme researcher, broadcasting/film/video in the Last Year: Never true   Ran Out of Food in the Last Year: Never true  Transportation Needs: No Transportation Needs   Lack of Transportation (Medical): No   Lack of Transportation (Non-Medical): No  Physical Activity: Not on file  Stress: Not on file  Social Connections: Not on file  Intimate Partner Violence: Not on file    Family History: Family History  Problem Relation Age of Onset   Hypertension Mother    Diabetes Mother    Hypertension Father    Diabetes Father    Diabetes Sister    Diabetes Brother     Review of Systems: All other systems reviewed and are otherwise negative except as noted above.   Physical Exam: Vitals:   03/24/21 1139  BP: 130/72  Pulse: 67  SpO2: 94%  Weight: 240 lb (108.9 kg)  Height: 6\' 5"  (1.956 m)     GEN- The patient is well appearing, alert and oriented x 3 today.   HEENT: normocephalic, atraumatic; sclera clear, conjunctiva pink; hearing intact; oropharynx clear; neck supple, no JVP Lymph- no cervical lymphadenopathy Lungs- Clear to ausculation bilaterally, normal work of breathing.  No wheezes, rales, rhonchi Heart- Regular rate  and rhythm, no murmurs, rubs or gallops, PMI  not laterally displaced GI- soft, non-tender, non-distended, bowel sounds present, no hepatosplenomegaly Extremities- no clubbing or cyanosis. No edema; DP/PT/radial pulses 2+ bilaterally MS- no significant deformity or atrophy Skin- warm and dry, no rash or lesion; ICD pocket well healed Psych- euthymic mood, full affect Neuro- strength and sensation are intact  ICD interrogation- reviewed in detail today,  See PACEART report  EKG:  EKG is not ordered today.  Recent Labs: 01/15/2021: B Natriuretic Peptide 532.8 01/16/2021: Magnesium 2.7; TSH 0.473 02/08/2021: ALT 25 03/04/2021: BUN 26; Creatinine, Ser 1.13; Hemoglobin 8.3; Platelets 482; Potassium 4.5; Sodium 137   Wt Readings from Last 3 Encounters:  03/24/21 240 lb (108.9 kg)  03/01/21 240 lb (108.9 kg)  02/14/21 226 lb 13.7 oz (102.9 kg)     Other studies Reviewed: Additional studies/ records that were reviewed today include: Previous EP office notes.   Assessment and Plan:  1.  Chronic systolic dysfunction s/p Medtronic dual chamber ICD  euvolemic today Stable on an appropriate medical regimen Normal ICD function See Pace Art report No changes today  2. CAD Denies s/s ischemia  Current medicines are reviewed at length with the patient today.    Labs/ tests ordered today include:  Orders Placed This Encounter  Procedures   Basic metabolic panel   CBC   Disposition:   Follow up with Dr. Graciela Husbands in 12 months. Sooner with issues.   Dustin Flock, PA-C  03/24/2021 12:01 PM  [facility] [address] [place] [address] [phone] (office) [phone] (fax)

## 2021-03-24 ENCOUNTER — Ambulatory Visit (INDEPENDENT_AMBULATORY_CARE_PROVIDER_SITE_OTHER): Payer: Medicare Other | Admitting: Student

## 2021-03-24 ENCOUNTER — Other Ambulatory Visit: Payer: Self-pay

## 2021-03-24 ENCOUNTER — Encounter: Payer: Self-pay | Admitting: Student

## 2021-03-24 VITALS — BP 130/72 | HR 67 | Ht 77.0 in | Wt 240.0 lb

## 2021-03-24 DIAGNOSIS — I428 Other cardiomyopathies: Secondary | ICD-10-CM

## 2021-03-24 DIAGNOSIS — I2510 Atherosclerotic heart disease of native coronary artery without angina pectoris: Secondary | ICD-10-CM | POA: Diagnosis not present

## 2021-03-24 LAB — CUP PACEART INCLINIC DEVICE CHECK
Battery Remaining Longevity: 73 mo
Battery Voltage: 2.96 V
Brady Statistic AP VP Percent: 0 %
Brady Statistic AP VS Percent: 0.2 %
Brady Statistic AS VP Percent: 0.05 %
Brady Statistic AS VS Percent: 99.75 %
Brady Statistic RA Percent Paced: 0.2 %
Brady Statistic RV Percent Paced: 0.05 %
Date Time Interrogation Session: 20220929134704
HighPow Impedance: 60 Ohm
Implantable Lead Implant Date: 20180719
Implantable Lead Implant Date: 20180719
Implantable Lead Location: 753859
Implantable Lead Location: 753860
Implantable Lead Model: 5076
Implantable Pulse Generator Implant Date: 20180719
Lead Channel Impedance Value: 304 Ohm
Lead Channel Impedance Value: 361 Ohm
Lead Channel Impedance Value: 399 Ohm
Lead Channel Pacing Threshold Amplitude: 0.75 V
Lead Channel Pacing Threshold Amplitude: 1 V
Lead Channel Pacing Threshold Pulse Width: 0.4 ms
Lead Channel Pacing Threshold Pulse Width: 0.4 ms
Lead Channel Sensing Intrinsic Amplitude: 3.875 mV
Lead Channel Sensing Intrinsic Amplitude: 4.5 mV
Lead Channel Sensing Intrinsic Amplitude: 4.875 mV
Lead Channel Sensing Intrinsic Amplitude: 5.375 mV
Lead Channel Setting Pacing Amplitude: 2 V
Lead Channel Setting Pacing Amplitude: 2 V
Lead Channel Setting Pacing Pulse Width: 0.4 ms
Lead Channel Setting Sensing Sensitivity: 0.3 mV

## 2021-03-24 LAB — BASIC METABOLIC PANEL
BUN/Creatinine Ratio: 21 — ABNORMAL HIGH (ref 9–20)
BUN: 21 mg/dL (ref 6–24)
CO2: 28 mmol/L (ref 20–29)
Calcium: 9.4 mg/dL (ref 8.7–10.2)
Chloride: 102 mmol/L (ref 96–106)
Creatinine, Ser: 0.99 mg/dL (ref 0.76–1.27)
Glucose: 140 mg/dL — ABNORMAL HIGH (ref 70–99)
Potassium: 4.5 mmol/L (ref 3.5–5.2)
Sodium: 141 mmol/L (ref 134–144)
eGFR: 88 mL/min/{1.73_m2} (ref >59)

## 2021-03-24 LAB — CBC
Hematocrit: 41.2 % (ref 37.5–51.0)
Hemoglobin: 13.1 g/dL (ref 13.0–17.7)
MCH: 28.8 pg (ref 26.6–33.0)
MCHC: 31.8 g/dL (ref 31.5–35.7)
MCV: 91 fL (ref 79–97)
Platelets: 284 10*3/uL (ref 150–450)
RBC: 4.55 x10E6/uL (ref 4.14–5.80)
RDW: 18.1 % — ABNORMAL HIGH (ref 11.6–15.4)
WBC: 5.6 10*3/uL (ref 3.4–10.8)

## 2021-03-24 NOTE — Patient Instructions (Signed)
Medication Instructions:  Your physician recommends that you continue on your current medications as directed. Please refer to the Current Medication list given to you today.  *If you need a refill on your cardiac medications before your next appointment, please call your pharmacy*   Lab Work: TODAY: BMET, CBC  If you have labs (blood work) drawn today and your tests are completely normal, you will receive your results only by: MyChart Message (if you have MyChart) OR A paper copy in the mail If you have any lab test that is abnormal or we need to change your treatment, we will call you to review the results.    Follow-Up: At [facility], you and your health needs are our priority.  As part of our continuing mission to provide you with exceptional heart care, we have created designated Provider Care Teams.  These Care Teams include your primary Cardiologist (physician) and Advanced Practice Providers (APPs -  Physician Assistants and Nurse Practitioners) who all work together to provide you with the care you need, when you need it.  We recommend signing up for the patient portal called "MyChart".  Sign up information is provided on this After Visit Summary.  MyChart is used to connect with patients for Virtual Visits (Telemedicine).  Patients are able to view lab/test results, encounter notes, upcoming appointments, etc.  Non-urgent messages can be sent to your provider as well.   To learn more about what you can do with MyChart, go to [email or web address].    Your next appointment:   1 year(s)  The format for your next appointment:   In Person  Provider:   You may see Steven Klein, MD or one of the following Advanced Practice Providers on your designated Care Team:   Renee Ursuy, PA-C Michael "Andy" Tillery, PA-C  

## 2021-03-25 ENCOUNTER — Other Ambulatory Visit (HOSPITAL_COMMUNITY): Payer: Self-pay | Admitting: Cardiology

## 2021-04-04 ENCOUNTER — Other Ambulatory Visit (HOSPITAL_COMMUNITY): Payer: Self-pay | Admitting: Cardiology

## 2021-04-05 ENCOUNTER — Ambulatory Visit (INDEPENDENT_AMBULATORY_CARE_PROVIDER_SITE_OTHER): Payer: Medicare Other | Admitting: Orthopedic Surgery

## 2021-04-05 DIAGNOSIS — Z89511 Acquired absence of right leg below knee: Secondary | ICD-10-CM

## 2021-04-07 ENCOUNTER — Encounter: Payer: Medicare Other | Admitting: Orthopedic Surgery

## 2021-04-07 ENCOUNTER — Encounter: Payer: Self-pay | Admitting: Orthopedic Surgery

## 2021-04-07 NOTE — Progress Notes (Signed)
Office Visit Note   Patient: Richard [name]           Date of Birth: [birth year]           MRN: [id number] Visit Date: 04/05/2021              Requested by: Ananias Pilgrim, MD [address] [place],  [place] [address] PCP: [name] Catalina, PA-C  Chief Complaint  Patient presents with   Right Leg - Routine Post Op    03/02/2021 right BKA       HPI: Patient is a 60 year old gentleman who presents 4 weeks status post right transtibial amputation.  Patient states he has been having drainage and swelling.  He is currently wearing a 3 extra-large shrinker.  Assessment & Plan: Visit Diagnoses:  1. S/P BKA (below knee amputation), right (HCC)     Plan: We will remove the staples today.  Patient will need a smaller shrinker.  Complete his course of antibiotics.  Follow-Up Instructions: Return in about 3 weeks (around 04/26/2021).   Ortho Exam  Patient is alert, oriented, no adenopathy, well-dressed, normal affect, normal respiratory effort. Examination there is swelling but no drainage.  There is no tenderness to palpation no cellulitis.  Patient is a new right transtibial  amputee.  [name] current comorbidities are not expected to impact the ability to function with the prescribed prosthesis. Patient verbally communicates a strong desire to use a prosthesis. Patient currently requires mobility aids to ambulate without a prosthesis.  Expects not to use mobility aids with a new prosthesis.  Patient is a K3 level ambulator that spends a lot of time walking around on uneven terrain over obstacles, up and down stairs, and ambulates with a variable cadence.     Imaging: No results found.   Labs: Lab Results  Component Value Date   HGBA1C 10.3 (H) 01/17/2021   HGBA1C 10.4 (H) 10/31/2017   LABURIC 8.2 09/22/2019   REPTSTATUS 01/22/2021 FINAL 01/16/2021   CULT  01/16/2021    NO GROWTH 6 DAYS Performed at [facility], [address], [place],  [place] [address]    [facility] STREPTOCOCCUS GROUP G 01/15/2021     Lab Results  Component Value Date   ALBUMIN 1.8 (L) 02/08/2021   ALBUMIN 1.7 (L) 01/28/2021   ALBUMIN 2.0 (L) 01/21/2021   PREALBUMIN 8.5 (L) 01/16/2021    Lab Results  Component Value Date   MG 2.7 (H) 01/16/2021   MG 2.5 (H) 01/15/2021   MG 2.2 10/18/2020   No results found for: [facility]  Lab Results  Component Value Date   PREALBUMIN 8.5 (L) 01/16/2021   CBC EXTENDED Latest Ref Rng & Units 03/24/2021 03/04/2021 03/03/2021  WBC 3.4 - 10.8 x10E3/uL 5.6 15.1(H) 15.1(H)  RBC 4.14 - 5.80 x10E6/uL 4.55 2.92(L) 3.29(L)  HGB 13.0 - 17.7 g/dL 30.1 6.0(F) 0.9(N)  HCT 37.5 - 51.0 % 41.2 27.0(L) 30.2(L)  PLT 150 - 450 x10E3/uL 284 482(H) 470(H)  NEUTROABS 1.7 - 7.7 K/uL - - -  LYMPHSABS 0.7 - 4.0 K/uL - - -     There is no height or weight on file to calculate BMI.  Orders:  No orders of the defined types were placed in this encounter.  No orders of the defined types were placed in this encounter.    Procedures: No procedures performed  Clinical Data: No additional findings.  ROS:  All other systems negative, except as noted in the HPI. Review of Systems  Objective: Vital Signs: There were no vitals taken for this visit.  Specialty Comments:  No specialty comments available.  PMFS History: Patient Active Problem List   Diagnosis Date Noted   Wound dehiscence 03/02/2021   Subacute osteomyelitis, right ankle and foot (HCC)    Sleep disturbance    Essential hypertension    Diabetic peripheral neuropathy (HCC)    Labile blood glucose    Left below-knee amputee (HCC) 02/07/2021   PAD (peripheral artery disease) (HCC) 02/01/2021   Below-knee amputation of left lower extremity (HCC) 01/27/2021   S/P BKA (below knee amputation), left (HCC)    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HCC)    SOB (shortness of breath)    Open wound    Bacteremia    Chronic ulcer of left ankle (HCC)    Severe sepsis without septic  shock (HCC)    COPD exacerbation (HCC) 01/15/2021   Chronic systolic CHF (congestive heart failure) (HCC) 01/15/2021   Acute on chronic respiratory failure with hypoxia and hypercapnia (HCC) 01/15/2021   Acute renal failure superimposed on stage 3 chronic kidney disease (HCC) 01/15/2021   Elevated troponin 01/15/2021   Hyponatremia 01/15/2021   CHF exacerbation (HCC) 10/17/2020   Acute on chronic systolic CHF (congestive heart failure) (HCC) 10/16/2020   Acute on chronic combined systolic and diastolic CHF (congestive heart failure) (HCC) 03/15/2018   S/P right and left heart catheterization 11/02/2017   Panic attacks 11/02/2017   Non-STEMI (non-ST elevated myocardial infarction) (HCC)    COPD (chronic obstructive pulmonary disease) (HCC)    Hypertension    CKD (chronic kidney disease)    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HCC)    Presence of implantable cardioverter-defibrillator (ICD)    GERD (gastroesophageal reflux disease)    Diabetes mellitus with diabetic neuropathy, with long-term current use of insulin (HCC)    Past Medical History:  Diagnosis Date   AICD (automatic cardioverter/defibrillator) present 01/11/2017   Anxiety    CHF (congestive heart failure) (HCC)    CKD (chronic kidney disease)    COPD (chronic obstructive pulmonary disease) (HCC)    never smoker, industrial exposure   Diabetes mellitus with diabetic neuropathy, with long-term current use of insulin (HCC)    GERD (gastroesophageal reflux disease)    High cholesterol    History of gout    "take RX qd" (11/01/2017)   Hypertension    Nonobstructive atherosclerosis of coronary artery    On home oxygen therapy    "2L prn" (11/01/2017)   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HCC)    LLL, present for years   Systolic HF (heart failure) (HCC)     Family History  Problem Relation Age of Onset   Hypertension Mother    Diabetes Mother    Hypertension Father    Diabetes Father    Diabetes Sister    Diabetes Brother     Past  Surgical History:  Procedure Laterality Date   ABDOMINAL AORTOGRAM W/LOWER EXTREMITY N/A 02/01/2021   Procedure: ABDOMINAL AORTOGRAM W/LOWER EXTREMITY;  Surgeon: Nada Libman, MD;  [facility];  Service: Cardiovascular;  Laterality: N/A;   AMPUTATION Left 01/21/2021   Procedure: AMPUTATION BELOW KNEE;  Surgeon: Nadara Mustard, MD;  Location: [facility] OR;  Service: Orthopedics;  Laterality: Left;   AMPUTATION Right 02/04/2021   Procedure: RIGHT FOOT 1ST AND 2ND RAY AMPUTATION;  Surgeon: Nadara Mustard, MD;  Location: [facility] OR;  Service: Orthopedics;  Laterality: Right;   AMPUTATION Right 03/02/2021   Procedure:  RIGHT BELOW KNEE AMPUTATION;  Surgeon: Nadara Mustard, MD;  Location: [facility] OR;  Service: Orthopedics;  Laterality: Right;   APPLICATION OF WOUND VAC Left 01/21/2021   Procedure: APPLICATION OF WOUND VAC;  Surgeon: Nadara Mustard, MD;  [facility] OR;  Service: Orthopedics;  Laterality: Left;   APPLICATION OF WOUND VAC  02/04/2021   Procedure: APPLICATION OF WOUND VAC;  Surgeon: Nadara Mustard, MD;  Location: [facility] OR;  Service: Orthopedics;;   APPLICATION OF WOUND VAC  03/02/2021   Procedure: APPLICATION OF WOUND VAC;  Surgeon: Nadara Mustard, MD;  [facility] OR;  Service: Orthopedics;;   CATARACT EXTRACTION W/ INTRAOCULAR LENS IMPLANTW/ TRABECULECTOMY Left ~ 2013   "may have done this when I had my other eye OR"   CIRCUMCISION     CORONARY STENT INTERVENTION N/A 11/02/2017   Procedure: CORONARY STENT INTERVENTION;  Surgeon: Laurey Morale, MD;  [facility];  Service: Cardiovascular;  Laterality: N/A;   CORONARY STENT INTERVENTION N/A 11/02/2017   Procedure: CORONARY STENT INTERVENTION;  Surgeon: Lyn Records, MD;  [facility];  Service: Cardiovascular;  Laterality: N/A;   EYE SURGERY Left ~ 2013   "fell on something in basement; stuck in my eye"   ICD IMPLANT     Medtronic Dual Chamber 01/11/17   MULTIPLE TOOTH EXTRACTIONS     "pulled all my top  teeth"   PERIPHERAL VASCULAR BALLOON ANGIOPLASTY  02/01/2021   Procedure: PERIPHERAL VASCULAR BALLOON ANGIOPLASTY;  Surgeon: Nada Libman, MD;  [facility];  Service: Cardiovascular;;  TP Trunk / PT   RIGHT/LEFT HEART CATH AND CORONARY ANGIOGRAPHY N/A 11/02/2017   Procedure: RIGHT/LEFT HEART CATH AND CORONARY ANGIOGRAPHY;  Surgeon: Laurey Morale, MD;  Location[facility] INVASIVE CV LAB;  Service: Cardiovascular;  Laterality: N/A;   TEE WITHOUT CARDIOVERSION N/A 01/20/2021   Procedure: TRANSESOPHAGEAL ECHOCARDIOGRAM (TEE);  Surgeon: Laurey Morale, MD;  Location: [facility] ENDOSCOPY;  Service: Cardiovascular;  Laterality: N/A;   Social History   Occupational History   Occupation: disability    Comment: stopped working in 2000 d/t occupational exposures  Tobacco Use   Smoking status: Former    Types: Cigarettes   Smokeless tobacco: Never   Tobacco comments:    "smoked when I drank"  Vaping Use   Vaping Use: Never used  Substance and Sexual Activity   Alcohol use: Not Currently    Comment: 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

## 2021-04-11 ENCOUNTER — Telehealth: Payer: Self-pay | Admitting: Orthopedic Surgery

## 2021-04-11 NOTE — Telephone Encounter (Signed)
Amber from [employer] called requesting orders for wound care. Pt no longer has wound vac and clarification orders are needed. Please call Amber at [phone]. If unable to answer leave detailed message on secure vm.

## 2021-04-12 ENCOUNTER — Telehealth: Payer: Self-pay | Admitting: Orthopedic Surgery

## 2021-04-12 NOTE — Telephone Encounter (Signed)
I spoke with Hospital doctor w/ [facility], gave her instructions for her and pt to wash incision daily with dial soap and warm water, pat dry. Apply shrinker, leave on at all times. Pt has drainage, advised her he needs to put shrinker on first then can put gauze and wrap ace bandage over top. She says that he is due for for next pre-cert in home health nursing for next 90 days, gave her approval.

## 2021-04-12 NOTE — Telephone Encounter (Signed)
Pt need a PCS ordered. Please call pt at [phone].

## 2021-04-13 ENCOUNTER — Telehealth: Payer: Self-pay | Admitting: Orthopedic Surgery

## 2021-04-13 NOTE — Telephone Encounter (Signed)
I called pt and lm on vm to advise to call back and let me know what it is that he is needing. I do not know what a PCS is. I am happy to help just need to know what it is he is talking about.

## 2021-04-13 NOTE — Telephone Encounter (Signed)
I called and sw Richard [name] and advised that I can give an order for a Home health aid since [facility] PT is coming out to the home and they can help with bathing and dressing a few times a week if needed. She states that the pt will not allow anyone to wash him but her and that she just needs someone to take his bowel movement to the commode and to cook for him and clean the house. I advised that this is not something that is covered by insurance and that there are agencies that she can work with that will provide these services but it would be an out of pocket expense. Advised that this is not what she had been told by the organization she is wanting to work with ( [facility]) and that she would have them to call me and explain what it is that she is needing for the pt.

## 2021-04-13 NOTE — Telephone Encounter (Signed)
Received call from Staci Righter she advised patient is needing PCA order to receive assistant at home. [name] said he need someone to assist patient with baths, cook and clean the home.  The number to contact [name] if needed is [phone]

## 2021-04-19 ENCOUNTER — Other Ambulatory Visit (HOSPITAL_COMMUNITY): Payer: Self-pay | Admitting: Cardiology

## 2021-04-21 ENCOUNTER — Telehealth: Payer: Self-pay

## 2021-04-21 NOTE — Telephone Encounter (Signed)
Dr Berline Chough was out of the office on yesterday afternoon. Caller was advised to call patient's PCP or Dr. Rodman Key [phone].   [name] the home health nurse called on yesterday:    Richard [name] does not have Glargine insulin on hand. Also she needed guidance on the sliding scale for the Humulin.   She was given both Dr. Janann August & Dr. Eliane Decree to contact for guidance.

## 2021-04-26 ENCOUNTER — Telehealth: Payer: Self-pay | Admitting: Orthopedic Surgery

## 2021-04-26 NOTE — Telephone Encounter (Signed)
Richard [name] called. patient is needing PCA order to receive assistant at home. Richard [name] said he need someone to assist patient with baths, cook and clean the home.  The number to contact Richard [name] if needed is [phone]

## 2021-04-28 NOTE — Telephone Encounter (Signed)
I spoke with [name] she says that we need to fill out a PCA form to have pt get personal care assistance for bathing. She is aware that we cannot send out referrals for someone to cook and clean as that is all out of pocket cost. She gave me the name and number of SW at [facility] whom she has been speaking with about. Floydene Flock [phone]

## 2021-05-02 NOTE — Telephone Encounter (Signed)
Form filled out and pending for MD signature.

## 2021-05-02 NOTE — Telephone Encounter (Signed)
I spoke with Arline Asp w/ [facility]. She says the form is through [employer]. It is their PCA assesment form. We will fill this out and then fax to [employer].

## 2021-05-03 NOTE — Telephone Encounter (Signed)
Form faxed to [facility] healthcare

## 2021-05-05 ENCOUNTER — Ambulatory Visit (INDEPENDENT_AMBULATORY_CARE_PROVIDER_SITE_OTHER): Payer: Medicare Other | Admitting: Orthopedic Surgery

## 2021-05-05 DIAGNOSIS — Z89511 Acquired absence of right leg below knee: Secondary | ICD-10-CM

## 2021-05-31 ENCOUNTER — Other Ambulatory Visit (HOSPITAL_COMMUNITY): Payer: Self-pay | Admitting: Cardiology

## 2021-05-31 ENCOUNTER — Ambulatory Visit (INDEPENDENT_AMBULATORY_CARE_PROVIDER_SITE_OTHER): Payer: Medicare Other

## 2021-05-31 DIAGNOSIS — I428 Other cardiomyopathies: Secondary | ICD-10-CM

## 2021-05-31 LAB — CUP PACEART REMOTE DEVICE CHECK
Battery Remaining Longevity: 70 mo
Battery Voltage: 2.98 V
Brady Statistic AP VP Percent: 0 %
Brady Statistic AP VS Percent: 0.99 %
Brady Statistic AS VP Percent: 0.07 %
Brady Statistic AS VS Percent: 98.94 %
Brady Statistic RA Percent Paced: 0.99 %
Brady Statistic RV Percent Paced: 0.07 %
Date Time Interrogation Session: 20221206043726
HighPow Impedance: 66 Ohm
Implantable Lead Implant Date: 20180719
Implantable Lead Implant Date: 20180719
Implantable Lead Location: 753859
Implantable Lead Location: 753860
Implantable Lead Model: 5076
Implantable Pulse Generator Implant Date: 20180719
Lead Channel Impedance Value: 304 Ohm
Lead Channel Impedance Value: 361 Ohm
Lead Channel Impedance Value: 399 Ohm
Lead Channel Pacing Threshold Amplitude: 0.875 V
Lead Channel Pacing Threshold Amplitude: 1 V
Lead Channel Pacing Threshold Pulse Width: 0.4 ms
Lead Channel Pacing Threshold Pulse Width: 0.4 ms
Lead Channel Sensing Intrinsic Amplitude: 3.75 mV
Lead Channel Sensing Intrinsic Amplitude: 3.75 mV
Lead Channel Sensing Intrinsic Amplitude: 6.75 mV
Lead Channel Sensing Intrinsic Amplitude: 6.75 mV
Lead Channel Setting Pacing Amplitude: 2 V
Lead Channel Setting Pacing Amplitude: 2 V
Lead Channel Setting Pacing Pulse Width: 0.4 ms
Lead Channel Setting Sensing Sensitivity: 0.3 mV

## 2021-06-02 ENCOUNTER — Ambulatory Visit (INDEPENDENT_AMBULATORY_CARE_PROVIDER_SITE_OTHER): Payer: Medicare Other | Admitting: Orthopedic Surgery

## 2021-06-02 DIAGNOSIS — Z89511 Acquired absence of right leg below knee: Secondary | ICD-10-CM

## 2021-06-02 DIAGNOSIS — Z89512 Acquired absence of left leg below knee: Secondary | ICD-10-CM

## 2021-06-07 ENCOUNTER — Encounter: Payer: Self-pay | Admitting: Orthopedic Surgery

## 2021-06-07 NOTE — Progress Notes (Signed)
Office Visit Note   Patient: Richard [name]           Date of Birth: 20-Jul-1960           MRN: [id number] Visit Date: 06/02/2021              Requested by: Robert Bellow, PA-C [id number] [place],  [place] [address] PCP: Robert Bellow, PA-C  Chief Complaint  Patient presents with   Right Leg - Routine Post Op    03/02/21 right BKA       HPI: Patient is a 60 year old gentleman who is 3 months status post right transtibial amputation.  At patient's last visit he was started on doxycycline with some drainage and swelling at the incision.  Assessment & Plan: Visit Diagnoses:  1. S/P BKA (below knee amputation), right ([place])   2. S/P BKA (below knee amputation), left ([place])     Plan: [name] leg is healed and well consolidated.  He will be fit for his prosthesis at [employer] tomorrow.  Prescription provided for physical therapy for gait training.  Follow-Up Instructions: Return if symptoms worsen or fail to improve.   Ortho Exam  Patient is alert, oriented, no adenopathy, well-dressed, normal affect, normal respiratory effort. Examination the residual limb is well-healed there is no redness cellulitis or drainage no open wounds.  Imaging: No results found. No images are attached to the encounter.  Labs: Lab Results  Component Value Date   HGBA1C 10.3 (H) 01/17/2021   HGBA1C 10.4 (H) 10/31/2017   LABURIC 8.2 09/22/2019   REPTSTATUS 01/22/2021 FINAL 01/16/2021   CULT  01/16/2021    NO GROWTH 6 DAYS Performed at [facility], [place] [address], [place],  [address]    LABORGA STREPTOCOCCUS GROUP G 01/15/2021     Lab Results  Component Value Date   ALBUMIN 1.8 (L) 02/08/2021   ALBUMIN 1.7 (L) 01/28/2021   ALBUMIN 2.0 (L) 01/21/2021   PREALBUMIN 8.5 (L) 01/16/2021    Lab Results  Component Value Date   MG 2.7 (H) 01/16/2021   MG 2.5 (H) 01/15/2021   MG 2.2 10/18/2020   No results found for: [facility]  Lab Results  Component Value  Date   PREALBUMIN 8.5 (L) 01/16/2021   CBC EXTENDED Latest Ref Rng & Units 03/24/2021 03/04/2021 03/03/2021  WBC 3.4 - 10.8 x10E3/uL 5.6 15.1(H) 15.1(H)  RBC 4.14 - 5.80 x10E6/uL 4.55 2.92(L) 3.29(L)  HGB 13.0 - 17.7 g/dL 13.1 8.3(L) 9.2(L)  HCT 37.5 - 51.0 % 41.2 27.0(L) 30.2(L)  PLT 150 - 450 x10E3/uL 284 482(H) 470(H)  NEUTROABS 1.7 - 7.7 K/uL - - -  LYMPHSABS 0.7 - 4.0 K/uL - - -     There is no height or weight on file to calculate BMI.  Orders:  Orders Placed This Encounter  Procedures   Ambulatory referral to Physical Therapy   No orders of the defined types were placed in this encounter.    Procedures: No procedures performed  Clinical Data: No additional findings.  ROS:  All other systems negative, except as noted in the HPI. Review of Systems  Objective: Vital Signs: There were no vitals taken for this visit.  Specialty Comments:  No specialty comments available.  PMFS History: Patient Active Problem List   Diagnosis Date Noted   Wound dehiscence 03/02/2021   Subacute osteomyelitis, right ankle and foot (HCC)    Sleep disturbance    Essential hypertension    Diabetic peripheral neuropathy (  HCC)    Labile blood glucose    Left below-knee amputee (HCC) 02/07/2021   PAD (peripheral artery disease) (HCC) 02/01/2021   Below-knee amputation of left lower extremity (HCC) 01/27/2021   S/P BKA (below knee amputation), left (HCC)    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HCC)    SOB (shortness of breath)    Open wound    Bacteremia    Chronic ulcer of left ankle (HCC)    Severe sepsis without septic shock (HCC)    COPD exacerbation (HCC) 01/15/2021   Chronic systolic CHF (congestive heart failure) (HCC) 01/15/2021   Acute on chronic respiratory failure with hypoxia and hypercapnia (HCC) 01/15/2021   Acute renal failure superimposed on stage 3 chronic kidney disease (HCC) 01/15/2021   Elevated troponin 01/15/2021   Hyponatremia 01/15/2021   CHF exacerbation  (HCC) 10/17/2020   Acute on chronic systolic CHF (congestive heart failure) (HCC) 10/16/2020   Acute on chronic combined systolic and diastolic CHF (congestive heart failure) (HCC) 03/15/2018   S/P right and left heart catheterization 11/02/2017   Panic attacks 11/02/2017   Non-STEMI (non-ST elevated myocardial infarction) (HCC)    COPD (chronic obstructive pulmonary disease) (HCC)    Hypertension    CKD (chronic kidney disease)    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HCC)    Presence of implantable cardioverter-defibrillator (ICD)    GERD (gastroesophageal reflux disease)    Diabetes mellitus with diabetic neuropathy, with long-term current use of insulin (HCC)    Past Medical History:  Diagnosis Date   AICD (automatic cardioverter/defibrillator) present 01/11/2017   Anxiety    CHF (congestive heart failure) (HCC)    CKD (chronic kidney disease)    COPD (chronic obstructive pulmonary disease) (HCC)    never smoker, industrial exposure   Diabetes mellitus with diabetic neuropathy, with long-term current use of insulin (HCC)    GERD (gastroesophageal reflux disease)    High cholesterol    History of gout    "take RX qd" (11/01/2017)   Hypertension    Nonobstructive atherosclerosis of coronary artery    On home oxygen therapy    "2L prn" (11/01/2017)   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HCC)    LLL, present for years   Systolic HF (heart failure) (HCC)     Family History  Problem Relation Age of Onset   Hypertension Mother    Diabetes Mother    Hypertension Father    Diabetes Father    Diabetes Sister    Diabetes Brother     Past Surgical History:  Procedure Laterality Date   ABDOMINAL AORTOGRAM W/LOWER EXTREMITY N/A 02/01/2021   Procedure: ABDOMINAL AORTOGRAM W/LOWER EXTREMITY;  Surgeon: Nada Libman, MD;  [facility];  Service: Cardiovascular;  Laterality: N/A;   AMPUTATION Left 01/21/2021   Procedure: AMPUTATION BELOW KNEE;  Surgeon: Nadara Mustard, MD;  Location: [facility] OR;   Service: Orthopedics;  Laterality: Left;   AMPUTATION Right 02/04/2021   Procedure: RIGHT FOOT 1ST AND 2ND RAY AMPUTATION;  Surgeon: Nadara Mustard, MD;  Location: [facility] OR;  Service: Orthopedics;  Laterality: Right;   AMPUTATION Right 03/02/2021   Procedure: RIGHT BELOW KNEE AMPUTATION;  Surgeon: Nadara Mustard, MD;  Location: [facility] OR;  Service: Orthopedics;  Laterality: Right;   APPLICATION OF WOUND VAC Left 01/21/2021   Procedure: APPLICATION OF WOUND VAC;  Surgeon: Nadara Mustard, MD;  [facility] OR;  Service: Orthopedics;  Laterality: Left;   APPLICATION OF WOUND VAC  02/04/2021  Procedure: APPLICATION OF WOUND VAC;  Surgeon: Nadara Mustard, MD;  Location: [facility] OR;  Service: Orthopedics;;   APPLICATION OF WOUND VAC  03/02/2021   Procedure: APPLICATION OF WOUND VAC;  Surgeon: Nadara Mustard, MD;  [facility] OR;  Service: Orthopedics;;   CATARACT EXTRACTION W/ INTRAOCULAR LENS IMPLANTW/ TRABECULECTOMY Left ~ 2013   "may have done this when I had my other eye OR"   CIRCUMCISION     CORONARY STENT INTERVENTION N/A 11/02/2017   Procedure: CORONARY STENT INTERVENTION;  Surgeon: Laurey Morale, MD;  [facility];  Service: Cardiovascular;  Laterality: N/A;   CORONARY STENT INTERVENTION N/A 11/02/2017   Procedure: CORONARY STENT INTERVENTION;  Surgeon: Lyn Records, MD;  [facility];  Service: Cardiovascular;  Laterality: N/A;   EYE SURGERY Left ~ 2013   "fell on something in basement; stuck in my eye"   ICD IMPLANT     Medtronic Dual Chamber 01/11/17   MULTIPLE TOOTH EXTRACTIONS     "pulled all my top teeth"   PERIPHERAL VASCULAR BALLOON ANGIOPLASTY  02/01/2021   Procedure: PERIPHERAL VASCULAR BALLOON ANGIOPLASTY;  Surgeon: Nada Libman, MD;  [facility];  Service: Cardiovascular;;  TP Trunk / PT   RIGHT/LEFT HEART CATH AND CORONARY ANGIOGRAPHY N/A 11/02/2017   Procedure: RIGHT/LEFT HEART CATH AND CORONARY ANGIOGRAPHY;  Surgeon: Laurey Morale,  MD;  Location: [facility] CV LAB;  Service: Cardiovascular;  Laterality: N/A;   TEE WITHOUT CARDIOVERSION N/A 01/20/2021   Procedure: TRANSESOPHAGEAL ECHOCARDIOGRAM (TEE);  Surgeon: Laurey Morale, MD;  Location: [facility] ENDOSCOPY;  Service: Cardiovascular;  Laterality: N/A;   Social History   Occupational History   Occupation: disability    Comment: stopped working in 2000 d/t occupational exposures  Tobacco Use   Smoking status: Former    Types: Cigarettes   Smokeless tobacco: Never   Tobacco comments:    "smoked when I drank"  Vaping Use   Vaping Use: Never used  Substance and Sexual Activity   Alcohol use: Not Currently    Comment: 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

## 2021-06-08 NOTE — Progress Notes (Signed)
Remote ICD transmission.   

## 2021-06-15 ENCOUNTER — Encounter: Payer: Self-pay | Admitting: Orthopedic Surgery

## 2021-06-15 NOTE — Progress Notes (Signed)
Office Visit Note   Patient: Richard [name]           Date of Birth: 1961-06-03           MRN: [id number] Visit Date: 05/05/2021              Requested by: Robert Bellow, PA-C [id number] [place],  [place] [address] PCP: Robert Bellow, PA-C  Chief Complaint  Patient presents with   Right Leg - Routine Post Op    03/02/21 right BKA       HPI: Patient is 2 months status post right transtibial amputation currently wearing a stump shrinker.  Assessment & Plan: Visit Diagnoses:  1. S/P BKA (below knee amputation), right [facility])     Plan: Patient has a follow-up appointment with [place] [facility] tomorrow  Follow-Up Instructions: Return in about 4 weeks (around 06/02/2021).   Ortho Exam  Patient is alert, oriented, no adenopathy, well-dressed, normal affect, normal respiratory effort. Examination there is a 1 retained absorbable suture that was removed.  There is no redness no cellulitis no signs of infection there is good consolidation of the residual limb.  Imaging: No results found. No images are attached to the encounter.  Labs: Lab Results  Component Value Date   HGBA1C 10.3 (H) 01/17/2021   HGBA1C 10.4 (H) 10/31/2017   LABURIC 8.2 09/22/2019   REPTSTATUS 01/22/2021 FINAL 01/16/2021   CULT  01/16/2021    NO GROWTH 6 DAYS Performed at [facility], [place] [address] [place], [place] [address]    LABORGA STREPTOCOCCUS GROUP G 01/15/2021     Lab Results  Component Value Date   ALBUMIN 1.8 (L) 02/08/2021   ALBUMIN 1.7 (L) 01/28/2021   ALBUMIN 2.0 (L) 01/21/2021   PREALBUMIN 8.5 (L) 01/16/2021    Lab Results  Component Value Date   MG 2.7 (H) 01/16/2021   MG 2.5 (H) 01/15/2021   MG 2.2 10/18/2020   No results found for: [facility]  Lab Results  Component Value Date   PREALBUMIN 8.5 (L) 01/16/2021   CBC EXTENDED Latest Ref Rng & Units 03/24/2021 03/04/2021 03/03/2021  WBC 3.4 - 10.8 x10E3/uL 5.6 15.1(H) 15.1(H)  RBC 4.14 - 5.80 x10E6/uL  4.55 2.92(L) 3.29(L)  HGB 13.0 - 17.7 g/dL 13.1 8.3(L) 9.2(L)  HCT 37.5 - 51.0 % 41.2 27.0(L) 30.2(L)  PLT 150 - 450 x10E3/uL 284 482(H) 470(H)  NEUTROABS 1.7 - 7.7 K/uL - - -  LYMPHSABS 0.7 - 4.0 K/uL - - -     There is no height or weight on file to calculate BMI.  Orders:  No orders of the defined types were placed in this encounter.  No orders of the defined types were placed in this encounter.    Procedures: No procedures performed  Clinical Data: No additional findings.  ROS:  All other systems negative, except as noted in the HPI. Review of Systems  Objective: Vital Signs: There were no vitals taken for this visit.  Specialty Comments:  No specialty comments available.  PMFS History: Patient Active Problem List   Diagnosis Date Noted   Wound dehiscence 03/02/2021   Subacute osteomyelitis, right ankle and foot (HCC)    Sleep disturbance    Essential hypertension    Diabetic peripheral neuropathy (HCC)    Labile blood glucose    Left below-knee amputee ([place]) 02/07/2021   PAD (peripheral artery disease) ([place]) 02/01/2021   Below-knee amputation of left lower extremity ([place]) 01/27/2021   S/P  BKA (below knee amputation), left (HCC)    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HCC)    SOB (shortness of breath)    Open wound    Bacteremia    Chronic ulcer of left ankle (HCC)    Severe sepsis without septic shock (HCC)    COPD exacerbation (HCC) 01/15/2021   Chronic systolic CHF (congestive heart failure) (HCC) 01/15/2021   Acute on chronic respiratory failure with hypoxia and hypercapnia (HCC) 01/15/2021   Acute renal failure superimposed on stage 3 chronic kidney disease (HCC) 01/15/2021   Elevated troponin 01/15/2021   Hyponatremia 01/15/2021   CHF exacerbation (HCC) 10/17/2020   Acute on chronic systolic CHF (congestive heart failure) (HCC) 10/16/2020   Acute on chronic combined systolic and diastolic CHF (congestive heart failure) (HCC) 03/15/2018   S/P right  and left heart catheterization 11/02/2017   Panic attacks 11/02/2017   Non-STEMI (non-ST elevated myocardial infarction) (HCC)    COPD (chronic obstructive pulmonary disease) (HCC)    Hypertension    CKD (chronic kidney disease)    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HCC)    Presence of implantable cardioverter-defibrillator (ICD)    GERD (gastroesophageal reflux disease)    Diabetes mellitus with diabetic neuropathy, with long-term current use of insulin (HCC)    Past Medical History:  Diagnosis Date   AICD (automatic cardioverter/defibrillator) present 01/11/2017   Anxiety    CHF (congestive heart failure) (HCC)    CKD (chronic kidney disease)    COPD (chronic obstructive pulmonary disease) (HCC)    never smoker, industrial exposure   Diabetes mellitus with diabetic neuropathy, with long-term current use of insulin (HCC)    GERD (gastroesophageal reflux disease)    High cholesterol    History of gout    "take RX qd" (11/01/2017)   Hypertension    Nonobstructive atherosclerosis of coronary artery    On home oxygen therapy    "2L prn" (11/01/2017)   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HCC)    LLL, present for years   Systolic HF (heart failure) (HCC)     Family History  Problem Relation Age of Onset   Hypertension Mother    Diabetes Mother    Hypertension Father    Diabetes Father    Diabetes Sister    Diabetes Brother     Past Surgical History:  Procedure Laterality Date   ABDOMINAL AORTOGRAM W/LOWER EXTREMITY N/A 02/01/2021   Procedure: ABDOMINAL AORTOGRAM W/LOWER EXTREMITY;  Surgeon: Nada Libman, MD;  [facility];  Service: Cardiovascular;  Laterality: N/A;   AMPUTATION Left 01/21/2021   Procedure: AMPUTATION BELOW KNEE;  Surgeon: Nadara Mustard, MD;  Location: [facility] OR;  Service: Orthopedics;  Laterality: Left;   AMPUTATION Right 02/04/2021   Procedure: RIGHT FOOT 1ST AND 2ND RAY AMPUTATION;  Surgeon: Nadara Mustard, MD;  Location: [facility] OR;  Service: Orthopedics;  Laterality:  Right;   AMPUTATION Right 03/02/2021   Procedure: RIGHT BELOW KNEE AMPUTATION;  Surgeon: Nadara Mustard, MD;  Location: [facility] OR;  Service: Orthopedics;  Laterality: Right;   APPLICATION OF WOUND VAC Left 01/21/2021   Procedure: APPLICATION OF WOUND VAC;  Surgeon: Nadara Mustard, MD;  Location: [facility] OR;  Service: Orthopedics;  Laterality: Left;   APPLICATION OF WOUND VAC  02/04/2021   Procedure: APPLICATION OF WOUND VAC;  Surgeon: Nadara Mustard, MD;  Location: [facility] OR;  Service: Orthopedics;;   APPLICATION OF WOUND VAC  03/02/2021   Procedure: APPLICATION OF WOUND VAC;  Surgeon: Lajoyce Corners,  Illene Regulus, MD;  Location: [place];  Service: Orthopedics;;   CATARACT EXTRACTION W/ INTRAOCULAR LENS IMPLANTW/ TRABECULECTOMY Left ~ 2013   "may have done this when I had my other eye OR"   [place] N/A 11/02/2017   Procedure: CORONARY STENT INTERVENTION;  Surgeon: Larey Dresser, MD;  Location: [place] CV LAB;  Service: Cardiovascular;  Laterality: N/A;   CORONARY STENT INTERVENTION N/A 11/02/2017   Procedure: CORONARY STENT INTERVENTION;  Surgeon: Belva Crome, MD;  Location: [place] CV LAB;  Service: Cardiovascular;  Laterality: N/A;   EYE SURGERY Left ~ 2013   "fell on something in basement; stuck in my eye"   ICD IMPLANT     Medtronic Dual Chamber 01/11/17   MULTIPLE TOOTH EXTRACTIONS     "pulled all my top teeth"   PERIPHERAL VASCULAR BALLOON ANGIOPLASTY  02/01/2021   Procedure: PERIPHERAL VASCULAR BALLOON ANGIOPLASTY;  Surgeon: Serafina Mitchell, MD;  Location: [place] CV LAB;  Service: Cardiovascular;;  TP Trunk / PT   RIGHT/LEFT HEART CATH AND CORONARY ANGIOGRAPHY N/A 11/02/2017   Procedure: RIGHT/LEFT HEART CATH AND CORONARY ANGIOGRAPHY;  Surgeon: Larey Dresser, MD;  Location: [place] CV LAB;  Service: Cardiovascular;  Laterality: N/A;   TEE WITHOUT CARDIOVERSION N/A 01/20/2021   Procedure: TRANSESOPHAGEAL ECHOCARDIOGRAM (TEE);  Surgeon: Larey Dresser, MD;   Location: [facility] ENDOSCOPY;  Service: Cardiovascular;  Laterality: N/A;   Social History   Occupational History   Occupation: disability    Comment: stopped working in 2000 d/t occupational exposures  Tobacco Use   Smoking status: Former    Types: Cigarettes   Smokeless tobacco: Never   Tobacco comments:    "smoked when I drank"  Vaping Use   Vaping Use: Never used  Substance and Sexual Activity   Alcohol use: Not Currently    Comment: 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

## 2021-07-01 ENCOUNTER — Other Ambulatory Visit: Payer: Self-pay

## 2021-07-01 ENCOUNTER — Ambulatory Visit: Payer: Medicare Other | Attending: Orthopedic Surgery

## 2021-07-01 DIAGNOSIS — R2689 Other abnormalities of gait and mobility: Secondary | ICD-10-CM | POA: Insufficient documentation

## 2021-07-01 DIAGNOSIS — Z89511 Acquired absence of right leg below knee: Secondary | ICD-10-CM | POA: Diagnosis present

## 2021-07-01 DIAGNOSIS — M6281 Muscle weakness (generalized): Secondary | ICD-10-CM | POA: Diagnosis present

## 2021-07-01 DIAGNOSIS — Z89512 Acquired absence of left leg below knee: Secondary | ICD-10-CM | POA: Diagnosis not present

## 2021-07-01 NOTE — Therapy (Signed)
[facility] [address] [place], [place], [address] Phone: [phone]   Fax:  [phone]  Physical Therapy Evaluation  Patient Details  Name: Richard [name] MRN: [id number] Date of Birth: 06-09-1961 Referring Provider (PT): Dr. Lajoyce Corners   Encounter Date: 07/01/2021   PT End of Session - 07/01/21 1445     Visit Number 1    Number of Visits 17    Date for PT Re-Evaluation 08/26/21    Authorization Type [facility]    PT Start Time 1445    PT Stop Time 1530    PT Time Calculation (min) 45 min    Equipment Utilized During Treatment Gait belt    Activity Tolerance Patient tolerated treatment well    Behavior During Therapy [facility] for tasks assessed/performed             Past Medical History:  Diagnosis Date   AICD (automatic cardioverter/defibrillator) present 01/11/2017   Anxiety    CHF (congestive heart failure) (HCC)    CKD (chronic kidney disease)    COPD (chronic obstructive pulmonary disease) (HCC)    never smoker, industrial exposure   Diabetes mellitus with diabetic neuropathy, with long-term current use of insulin (HCC)    GERD (gastroesophageal reflux disease)    High cholesterol    History of gout    "take RX qd" (11/01/2017)   Hypertension    Nonobstructive atherosclerosis of coronary artery    On home oxygen therapy    "2L prn" (11/01/2017)   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HCC)    LLL, present for years   Systolic HF (heart failure) (HCC)     Past Surgical History:  Procedure Laterality Date   ABDOMINAL AORTOGRAM W/LOWER EXTREMITY N/A 02/01/2021   Procedure: ABDOMINAL AORTOGRAM W/LOWER EXTREMITY;  Surgeon: Nada Libman, MD;  [facility];  Service: Cardiovascular;  Laterality: N/A;   AMPUTATION Left 01/21/2021   Procedure: AMPUTATION BELOW KNEE;  Surgeon: Nadara Mustard, MD;  Location: [facility] OR;  Service: Orthopedics;  Laterality: Left;   AMPUTATION Right 02/04/2021   Procedure: RIGHT FOOT 1ST  AND 2ND RAY AMPUTATION;  Surgeon: Nadara Mustard, MD;  Location: [facility] OR;  Service: Orthopedics;  Laterality: Right;   AMPUTATION Right 03/02/2021   Procedure: RIGHT BELOW KNEE AMPUTATION;  Surgeon: Nadara Mustard, MD;  Location: [facility] OR;  Service: Orthopedics;  Laterality: Right;   APPLICATION OF WOUND VAC Left 01/21/2021   Procedure: APPLICATION OF WOUND VAC;  Surgeon: Nadara Mustard, MD;  [facility];  Service: Orthopedics;  Laterality: Left;   APPLICATION OF WOUND VAC  02/04/2021   Procedure: APPLICATION OF WOUND VAC;  Surgeon: Nadara Mustard, MD;  Location: [facility] OR;  Service: Orthopedics;;   APPLICATION OF WOUND VAC  03/02/2021   Procedure: APPLICATION OF WOUND VAC;  Surgeon: Nadara Mustard, MD;  [facility];  Service: Orthopedics;;   CATARACT EXTRACTION W/ INTRAOCULAR LENS IMPLANTW/ TRABECULECTOMY Left ~ 2013   "may have done this when I had my other eye OR"   CIRCUMCISION     CORONARY STENT INTERVENTION N/A 11/02/2017   Procedure: CORONARY STENT INTERVENTION;  Surgeon: Laurey Morale, MD;  [facility];  Service: Cardiovascular;  Laterality: N/A;   CORONARY STENT INTERVENTION N/A 11/02/2017   Procedure: CORONARY STENT INTERVENTION;  Surgeon: Lyn Records, MD;  [facility];  Service: Cardiovascular;  Laterality: N/A;   EYE SURGERY Left ~ 2013   "fell on  something in basement; stuck in my eye"   ICD IMPLANT     Medtronic Dual Chamber 01/11/17   MULTIPLE TOOTH EXTRACTIONS     "pulled all my top teeth"   PERIPHERAL VASCULAR BALLOON ANGIOPLASTY  02/01/2021   Procedure: PERIPHERAL VASCULAR BALLOON ANGIOPLASTY;  Surgeon: Serafina Mitchell, MD;  Location: [facility] LAB;  Service: Cardiovascular;;  [facility] / PT   RIGHT/LEFT HEART CATH AND CORONARY ANGIOGRAPHY N/A 11/02/2017   Procedure: RIGHT/LEFT HEART CATH AND CORONARY ANGIOGRAPHY;  Surgeon: Larey Dresser, MD;  Location: [facility] CV LAB;  Service: Cardiovascular;  Laterality: N/A;   TEE WITHOUT  CARDIOVERSION N/A 01/20/2021   Procedure: TRANSESOPHAGEAL ECHOCARDIOGRAM (TEE);  Surgeon: Larey Dresser, MD;  Location: [facility] ENDOSCOPY;  Service: Cardiovascular;  Laterality: N/A;    There were no vitals filed for this visit.    Subjective Assessment - 07/01/21 1424     Subjective Patient had L BKA on 01/14/21 and R BKA on 03/02/21. Received his prosthesis on 06/02/21. Currently he reports that he has tried to walk with his brother and by himself (without AD) in his driveway. Pt denies falls. Pt uses portable O2 tank at 2L but has not been using it since he is not active. He didn't bring it with him.    Patient is accompained by: Family member    Pertinent History Bil BKA    Limitations Standing;Lifting;Walking;House hold activities    How long can you sit comfortably? no issues    How long can you stand comfortably? 5 min    How long can you walk comfortably? 10 meters    Patient Stated Goals Walk better    Currently in Pain? No/denies                [facility] PT Assessment - 07/01/21 1353       Assessment   Medical Diagnosis B BKA    Referring Provider (PT) Dr. Sharol Given    Onset Date/Surgical Date 01/19/21      Precautions   Precautions Fall   External O2 may be required     Restrictions   Weight Bearing Restrictions No      Balance Screen   Has the patient fallen in the past 6 months No      [place] residence    Living Arrangements Spouse/significant other;Children    Available Help at Discharge Family    Type of [place] Two level;Able to live on main level with bedroom/bathroom    Alternate Level Stairs-Number of Steps [place] - 4 wheels      Prior Function   Level of Independence Independent      Cognition   Overall Cognitive Status Within Functional Limits for tasks assessed      Observation/Other Assessments   Skin Integrity Skin incisions are well healed with scabs present, no blisters  or redness present      Standardized Balance Assessment   Standardized Balance Assessment 10 meter walk test;Timed Up and Go Test    10 Meter Walk 0.42 m/s with RW      Timed Up and Go Test   Normal TUG (seconds) 45   with RW (SpO2 went down to 75%)            Prosthetics Assessment - 07/01/21 0001       Prosthetics   Prosthetic Care Dependent with Skin check;Residual limb care;Care  of non-amputated limb;Prosthetic cleaning;Ply sock cleaning;Correct ply sock adjustment;Proper wear schedule/adjustment;Proper weight-bearing schedule/adjustment    Donning prosthesis  Supervision    Doffing prosthesis  Supervision    Current prosthetic wear tolerance (days/week)  7    Current prosthetic wear tolerance (#hours/day)  45 min                       Objective measurements completed on examination: See above findings.   Pt was wearing ply sock under his gel sleeve, pt educated not to put anything under gel sleeve. Ply socks should go above the gel liner  Pt educated on residual limb care: - washing limb at night and in the morning with soap and water - can apply lotion at night but has to clean it off in the morning - can apply dry deodorant to residual limb for sweat management - pt educated on getting handheld mirrior to perform skin check frequenty at home.  Prosthetic limb care: - cleaning inside of the liner with antibacterial soap and water and letting it air dry. Using 2nd pair next day - wiping inside the prosthetic socket with antimicrobial wipes - not letting water go inside the socket - keeping ply socks clean  Sock management: - pt educated on listening for [id number] clicks to find proper ply sock for better fit and not letting residual limb bottom out in the socket. - Pt educated on keeping a bag with 2x 5ply, 4x 3 ply and 6x 1ply socks along with clean wash clothes and ziplock bag to keep dirty wash clothes at all times so he can ajust socks as needed - pt  educated on letting residual limb dry out with sweat when he sits down  Pt educated that he should not be walking without AD at this time. He should be using RW to start out with. Patient and wife educated on calling referring physician to place a RW referral and then contacting medical supply company to get the RW.                 PT Short Term Goals - 07/01/21 1446       PT SHORT TERM GOAL #1   Title Patient will be able to ambulate 230' with RW and CGA with SpO2 stats >90% with 2 L of external O2 to improve walking endurance    Baseline 20 L (without O2, SpO2 dropped to 75% from 95% at rest)(07/01/21)    Time 4    Period Weeks    Status New    Target Date 07/29/21      PT SHORT TERM GOAL #2   Title Pt will be able to perform sit to stand from bariatric chair with one UE support    Baseline Bil heavy UE support  with sit to stand (07/01/21)    Time 4    Period Weeks    Status New    Target Date 07/29/21      PT SHORT TERM GOAL #3   Title Patient will verbalize proper skin check, prosthetic limb care and sock management to improve comfort and safety with use of prosthetic limb with WB activities.    Baseline Education initiated (07/01/21)    Time 4    Period Weeks    Status New    Target Date 07/29/21               PT Long Term Goals - 07/01/21 1450  PT LONG TERM GOAL #1   Title Pt will be able to ambulate 500' with LRAD (with or without external O2) while maintaining SpO2 >90% on level ground with SBA to improve community ambulation    Baseline 10 meters with RW CGA (07/01/21)    Time 8    Period Weeks    Status New    Target Date 08/26/21      PT LONG TERM GOAL #2   Title Pt will be able to go up and down 12 steps with one rail to be able to access second floor at home    Baseline Pt lives on main level (07/01/21)    Time 8    Period Weeks    Status New    Target Date 08/26/21      PT LONG TERM GOAL #3   Title Pt will be able to ambulate 200' on  grass to be able to [employer] terrain.    Baseline Not attempted (07/01/21)    Time 8    Period Weeks    Status New    Target Date 08/26/21                    Plan - 07/01/21 1439     Clinical Impression Statement Patient is a 61 y.o. male who was seen today for physical therapy evaluation and treatment for gait and mobility disorder after bil BKA. Pt currently demonstrates poor knowledge of residula limb care, proper prosthetic leg care and donning/doffing, gait abnormalites, balance issues, decreased transfers, and decreased endurance. Pt had lung disease and relies on external O2 to maintain prooper SpO2 wth activity. Patient will benefit from skilled PT to address these impairments and improve overall function.    Personal Factors and Comorbidities Comorbidity 2    Comorbidities Lung disease, bil BKA    Examination-Activity Limitations Bend;Carry;Hygiene/Grooming;Lift;Squat;Stairs;Stand;Toileting;Transfers    Examination-Participation Restrictions Cleaning;Community Activity;Driving;Laundry;Meal Prep;Shop;Yard Work    Conservation officer, historic buildings Stable/Uncomplicated    Clinical Decision Making Moderate    Rehab Potential Good    PT Frequency 2x / week    PT Duration 8 weeks    PT Treatment/Interventions ADLs/Self Care Home Management;Cryotherapy;Moist Heat;Balance training;Therapeutic exercise;Therapeutic activities;Functional mobility training;Stair training;Gait training;Neuromuscular re-education;Patient/family Producer, television/film/video;Wheelchair mobility training;Manual techniques;Energy conservation;Passive range of motion;Scar mobilization;Joint Manipulations    PT Next Visit Plan Hopefully pt brings his Portable O2 tank and extra socks, Measure Vitals; Review skin care, prosthetic care, sock management again with patient. Gait training with RW. Static balance training with reaching.    PT Home Exercise Plan Education reviewed verbally    Consulted and Agree  with Plan of Care Patient;Family member/caregiver             Patient will benefit from skilled therapeutic intervention in order to improve the following deficits and impairments:  Abnormal gait, Cardiopulmonary status limiting activity, Decreased activity tolerance, Decreased balance, Decreased endurance, Decreased knowledge of precautions, Decreased mobility, Decreased range of motion, Difficulty walking, Decreased strength, Decreased scar mobility, Decreased skin integrity, Increased edema, Increased fascial restricitons, Impaired flexibility, Postural dysfunction, Improper body mechanics, Prosthetic Dependency, Pain, Decreased coordination  Visit Diagnosis: Hx of BKA, left (HCC)  Hx of BKA, right (HCC)  Other abnormalities of gait and mobility  Muscle weakness (generalized)     Problem List Patient Active Problem List   Diagnosis Date Noted   Wound dehiscence 03/02/2021   Subacute osteomyelitis, right ankle and foot (HCC)    Sleep disturbance    Essential hypertension  Diabetic peripheral neuropathy (HCC)    Labile blood glucose    Left below-knee amputee ([place]) 02/07/2021   PAD (peripheral artery disease) ([place]) 02/01/2021   Below-knee amputation of left lower extremity ([place]) 01/27/2021   S/P BKA (below knee amputation), left (HCC)    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HCC)    SOB (shortness of breath)    Open wound    Bacteremia    Chronic ulcer of left ankle (HCC)    Severe sepsis without septic shock (HCC)    COPD exacerbation (HCC) [id number]   Chronic systolic CHF (congestive heart failure) ([place]) 01/15/2021   Acute on chronic respiratory failure with hypoxia and hypercapnia ([place]) 01/15/2021   Acute renal failure superimposed on stage 3 chronic kidney disease (HCC) 01/15/2021   Elevated troponin 01/15/2021   Hyponatremia 01/15/2021   CHF exacerbation ([place]) 10/17/2020   Acute on chronic systolic CHF (congestive heart failure) ([place]) 10/16/2020   Acute on  chronic combined systolic and diastolic CHF (congestive heart failure) ([place]) 03/15/2018   S/P right and left heart catheterization 11/02/2017   Panic attacks 11/02/2017   Non-STEMI (non-ST elevated myocardial infarction) ([place])    COPD (chronic obstructive pulmonary disease) ([place])    Hypertension    CKD (chronic kidney disease)    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[place])    Presence of implantable cardioverter-defibrillator (ICD)    GERD (gastroesophageal reflux disease)    Diabetes mellitus with diabetic neuropathy, with long-term current use of insulin ([place])     Kerrie Pleasure, PT 07/01/2021, 4:14 PM  [facility] [address] [place], [place], [address] Phone: [phone]   Fax:  [phone]  Name: Richard [name] MRN: [id number] Date of Birth: [birth year]

## 2021-07-05 ENCOUNTER — Telehealth: Payer: Self-pay

## 2021-07-05 NOTE — Telephone Encounter (Signed)
Called pt to advise that rx is at the front desk for pick up to call with any questions.

## 2021-07-05 NOTE — Telephone Encounter (Signed)
-----   Message from Nadara Mustard, MD sent at 07/04/2021  8:41 AM EST ----- Regarding: FW: Rolling walker referral Rx written for rolling walker, he can pick it up ----- Message ----- From: Ileana Ladd, PT Sent: 07/01/2021   4:22 PM EST To: Nadara Mustard, MD Subject: Rolling walker referral                        Hi Dr. Lajoyce Corners,  I just evaluated Richard [name] for PT after his BKA. He is doing well. He currently doesn't have AD and I think he will be safer with using Rolling walker to start out with for safety and endurance.  Would you mind placing an order in Epic for Rolling walker for him please?  They will reach out to your office to pick it up to take it to medical supply company. Thank you for your referral.  Lavone Nian, PT

## 2021-07-08 ENCOUNTER — Other Ambulatory Visit: Payer: Self-pay

## 2021-07-08 ENCOUNTER — Ambulatory Visit: Payer: Medicare Other

## 2021-07-08 VITALS — BP 140/86 | HR 82

## 2021-07-08 DIAGNOSIS — Z89512 Acquired absence of left leg below knee: Secondary | ICD-10-CM | POA: Diagnosis not present

## 2021-07-08 DIAGNOSIS — M6281 Muscle weakness (generalized): Secondary | ICD-10-CM

## 2021-07-08 DIAGNOSIS — R2689 Other abnormalities of gait and mobility: Secondary | ICD-10-CM

## 2021-07-08 NOTE — Therapy (Signed)
[facility] [address] [place], [place], [address] Phone: [phone]   Fax:  [phone]  Physical Therapy Treatment  Patient Details  Name: Richard [name] MRN: [id number] Date of Birth: 21-Sep-1960 Referring Provider (PT): Dr. Sharol Given   Encounter Date: 07/08/2021   PT End of Session - 07/08/21 0855     Visit Number 2    Number of Visits 17    Date for PT Re-Evaluation 08/26/21    Authorization Type MC/MCD    PT Start Time 0845    PT Stop Time 0933    PT Time Calculation (min) 48 min    Equipment Utilized During Treatment Gait belt    Activity Tolerance Patient tolerated treatment well    Behavior During Therapy [facility] for tasks assessed/performed             Past Medical History:  Diagnosis Date   AICD (automatic cardioverter/defibrillator) present 01/11/2017   Anxiety    CHF (congestive heart failure) (HCC)    CKD (chronic kidney disease)    COPD (chronic obstructive pulmonary disease) ([place])    never smoker, industrial exposure   Diabetes mellitus with diabetic neuropathy, with long-term current use of insulin (HCC)    GERD (gastroesophageal reflux disease)    High cholesterol    History of gout    "take RX qd" (11/01/2017)   Hypertension    Nonobstructive atherosclerosis of coronary artery    On home oxygen therapy    "2L prn" (11/01/2017)   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[place])    LLL, present for years   Systolic HF (heart failure) ([place])     Past Surgical History:  Procedure Laterality Date   ABDOMINAL AORTOGRAM W/LOWER EXTREMITY N/A 02/01/2021   Procedure: ABDOMINAL AORTOGRAM W/LOWER EXTREMITY;  Surgeon: Serafina Mitchell, MD;  Location: [facility];  Service: Cardiovascular;  Laterality: N/A;   AMPUTATION Left 01/21/2021   Procedure: AMPUTATION BELOW KNEE;  Surgeon: Newt Minion, MD;  Location: [facility];  Service: Orthopedics;  Laterality: Left;   AMPUTATION Right 02/04/2021   Procedure: RIGHT FOOT 1ST  AND 2ND RAY AMPUTATION;  Surgeon: Newt Minion, MD;  Location: [facility];  Service: Orthopedics;  Laterality: Right;   AMPUTATION Right 03/02/2021   Procedure: RIGHT BELOW KNEE AMPUTATION;  Surgeon: Newt Minion, MD;  Location: [facility];  Service: Orthopedics;  Laterality: Right;   APPLICATION OF WOUND VAC Left 01/21/2021   Procedure: APPLICATION OF WOUND VAC;  Surgeon: Newt Minion, MD;  Location: [facility];  Service: Orthopedics;  Laterality: Left;   APPLICATION OF WOUND VAC  02/04/2021   Procedure: APPLICATION OF WOUND VAC;  Surgeon: Newt Minion, MD;  Location: [facility];  Service: Orthopedics;;   APPLICATION OF WOUND VAC  03/02/2021   Procedure: APPLICATION OF WOUND VAC;  Surgeon: Newt Minion, MD;  Location: [facility];  Service: Orthopedics;;   CATARACT EXTRACTION W/ INTRAOCULAR LENS IMPLANTW/ TRABECULECTOMY Left ~ 2013   "may have done this when I had my other eye OR"   [place] N/A 11/02/2017   Procedure: CORONARY STENT INTERVENTION;  Surgeon: Larey Dresser, MD;  Location: [facility];  Service: Cardiovascular;  Laterality: N/A;   CORONARY STENT INTERVENTION N/A 11/02/2017   Procedure: CORONARY STENT INTERVENTION;  Surgeon: Belva Crome, MD;  Location: [facility];  Service: Cardiovascular;  Laterality: N/A;   EYE SURGERY Left ~ 2013   "fell on  something in basement; stuck in my eye"   ICD IMPLANT     Medtronic Dual Chamber 01/11/17   MULTIPLE TOOTH EXTRACTIONS     "pulled all my top teeth"   PERIPHERAL VASCULAR BALLOON ANGIOPLASTY  02/01/2021   Procedure: PERIPHERAL VASCULAR BALLOON ANGIOPLASTY;  Surgeon: Serafina Mitchell, MD;  Location: [facility];  Service: Cardiovascular;;  TP Trunk / PT   RIGHT/LEFT HEART CATH AND CORONARY ANGIOGRAPHY N/A 11/02/2017   Procedure: RIGHT/LEFT HEART CATH AND CORONARY ANGIOGRAPHY;  Surgeon: Larey Dresser, MD;  Location: [facility];  Service: Cardiovascular;  Laterality: N/A;   TEE WITHOUT  CARDIOVERSION N/A 01/20/2021   Procedure: TRANSESOPHAGEAL ECHOCARDIOGRAM (TEE);  Surgeon: Larey Dresser, MD;  Location: [facility];  Service: Cardiovascular;  Laterality: N/A;    Vitals:   07/08/21 0853  BP: 140/86  Pulse: 82  SpO2: 92%     Subjective Assessment - 07/08/21 0855     Subjective Pt walked in holding to back of his wheelchair. Not wearing his oxygen but does have it with him. Pt and wife report they are going by Dr. Jess Barters office after visit today to get order for walker.    Patient is accompained by: Family member    Pertinent History Bil BKA    Limitations Standing;Lifting;Walking;House hold activities    How long can you sit comfortably? no issues    How long can you stand comfortably? 5 min    How long can you walk comfortably? 10 meters    Patient Stated Goals Walk better    Currently in Pain? No/denies                [facility] PT Assessment - 07/08/21 0856       Sensation   Light Touch Impaired by gross assessment    Additional Comments Pt has no light tough distal residual limbs      ROM / Strength   AROM / PROM / Strength Strength      Strength   Strength Assessment Site Knee;Hip    Right/Left Hip Right;Left    Right Hip Flexion 4+/5    Right Hip Extension 4+/5    Left Hip Flexion 4+/5    Left Hip Extension 4+/5    Right/Left Knee Right;Left    Right Knee Flexion 4+/5    Right Knee Extension 5/5    Left Knee Flexion 4/5    Left Knee Extension 4+/5                           [facility] Adult PT Treatment/Exercise - 07/08/21 0856       Transfers   Transfers Sit to Stand;Stand to Sit    Sit to Stand 4: Min guard;With upper extremity assist    Stand to Sit 4: Min guard;Without upper extremity assist      Ambulation/Gait   Ambulation/Gait Yes    Ambulation/Gait Assistance 4: Min guard    Ambulation/Gait Assistance Details O2 sat=88% after gait with no oxygen. Verbal cues to focus on breathing. Increased to 94% after a couple  minutes of pursed lip breathing. Pt had good upright posture with gait.    Ambulation Distance (Feet) 115 Feet    Assistive device Rolling walker;Prostheses    Gait Pattern Step-through pattern    Ambulation Surface Level;Indoor      Standardized Balance Assessment   Standardized Balance Assessment Berg Balance Test   O2 sat down to 86% after  most of test. Seated rest to come back up to 92%.     Berg Balance Test   Sit to Stand Needs minimal aid to stand or to stabilize    Standing Unsupported Able to stand 30 seconds unsupported    Sitting with Back Unsupported but Feet Supported on Floor or Stool Able to sit safely and securely 2 minutes    Stand to Sit Controls descent by using hands    Transfers Needs one person to assist    Standing Unsupported with Eyes Closed Needs help to keep from falling    Standing Ubsupported with Feet Together Needs help to attain position but able to stand for 30 seconds with feet together    From Standing, Reach Forward with Outstretched Arm Can reach forward >5 cm safely (2")    From Standing Position, Pick up Object from Floor Unable to pick up and needs supervision    From Standing Position, Turn to Look Behind Over each Shoulder Turn sideways only but maintains balance    Turn 360 Degrees Needs assistance while turning    Standing Unsupported, Alternately Place Feet on Step/Stool Able to complete >2 steps/needs minimal assist    Standing Unsupported, One Foot in [employer] balance while stepping or standing    Standing on One Leg Unable to try or needs assist to prevent fall    Total Score 18      Prosthetics   Prosthetic Care Comments  Pt has 3 ply on right and 1 ply on left. Pt sitting too low in prosthesis and did report pressure on patella when PT asked. Multiple clicks when just donning. PT educated on how to tell if sitting too low: too much distal pressure if feels the pressure, pressure on patella, too much play in prosthesis. Pt forgot to bring  his extra socks today and advised to be sure to bring next time. Discussed that socks will most likely need to be adjusted throughout day as on leg more as fluid pushes out. Also educated on proper way to donn socks. With donning prosthesis instructed not to clam leg down in to prosthesis. If will not click in may have to readjust liner.  Discussed increasing wear time to 2 hours 2x/day and monitoring skin. Also educated on if he is having trouble with sweating he can try antiperspirant at night (recommended Secret clinical strength if doesn't have a good one currently). To be sure there is no residue when donns liner in AM. Also educated on not overdoing things and to only be up when gets walker with family with him. Oxygen use will be important with activity right now as walking with prostheses takes a lot more energy and he is desaturating. With doffing prosthesis pt reporting he has trouble with left at times. Advised he could use a pen tip or something to push more if needed.    Current prosthetic wear tolerance (days/week)  daily    Current prosthetic wear tolerance (#hours/day)  1 hour    Residual limb condition  scabs at outer incision lines on each leg.    Education Provided Skin check;Residual limb care;Correct ply sock adjustment;Proper Donning;Proper Doffing;Proper wear schedule/adjustment    Person(s) Educated Patient;Spouse    Education Method Explanation;Demonstration    Education Method Verbalized understanding;Needs further instruction    Donning Prosthesis Minimal assist    Doffing Prosthesis Supervision;Minimal assist  PT Education - 07/08/21 1340     Education Details Prosthetic education-see section, to bring portable O2 next time as well as extra socks.    Person(s) Educated Patient;Spouse    Methods Explanation;Demonstration    Comprehension Verbalized understanding;Need further instruction              PT Short Term Goals - 07/08/21 1337        PT SHORT TERM GOAL #1   Title Patient will be able to ambulate 230' with RW and CGA with SpO2 stats >90% with 2 L of external O2 to improve walking endurance    Baseline 20 L (without O2, SpO2 dropped to 75% from 95% at rest)(07/01/21)    Time 4    Period Weeks    Status New    Target Date 07/29/21      PT SHORT TERM GOAL #2   Title Pt will be able to perform sit to stand from bariatric chair with one UE support    Baseline Bil heavy UE support  with sit to stand (07/01/21)    Time 4    Period Weeks    Status New    Target Date 07/29/21      PT SHORT TERM GOAL #3   Title Patient will verbalize proper skin check, prosthetic limb care and sock management to improve comfort and safety with use of prosthetic limb with WB activities.    Baseline Education initiated (07/01/21)    Time 4    Period Weeks    Status New    Target Date 07/29/21      PT SHORT TERM GOAL #4   Title Pt will decrease TUG from 45 sec to <30 sec for improved balance and functional mobility.    Baseline baseline 45 sed    Time 4    Period Weeks    Status New    Target Date 07/29/21               PT Long Term Goals - 07/08/21 1338       PT LONG TERM GOAL #1   Title Pt will be able to ambulate 500' with LRAD (with or without external O2) while maintaining SpO2 >90% on level ground with SBA to improve community ambulation    Baseline 10 meters with RW CGA (07/01/21)    Time 8    Period Weeks    Status New    Target Date 08/26/21      PT LONG TERM GOAL #2   Title Pt will be able to go up and down 12 steps with one rail to be able to access second floor at home    Baseline Pt lives on main level (07/01/21)    Time 8    Period Weeks    Status New    Target Date 08/26/21      PT LONG TERM GOAL #3   Title Pt will be able to ambulate 200' on grass to be able to [employer] terrain.    Baseline Not attempted (07/01/21)    Time 8    Period Weeks    Status New    Target Date 08/26/21      PT LONG  TERM GOAL #4   Title Pt will increase Berg to >35/56 for improved balance and decreased fall risk.    Baseline 07/08/21 18/56    Time 8    Period Weeks    Status New    Target Date 08/26/21  PT LONG TERM GOAL #5   Title Pt will be able to tolerate wearing prostheses 8 hours/day or more with no skin issues for improved function.    Time 8    Period Weeks    Status New    Target Date 08/26/21                   Plan - 07/08/21 1341     Clinical Impression Statement PT further assessed pt today. Noted to have good strength in BLE. Lacking sensation distal residual limb. Merrilee Jansky was performed with score of 18/56 indicating high fall risk. Pt did not have his regulator to be able to use his portable oxygen today again and does desat with activity. Stressed importance of this as walking with prostheses takes a lot more energy. Continues to need education on prosthetic management. PT updated goals to add balance goals and wear time goals.    Personal Factors and Comorbidities Comorbidity 2    Comorbidities Lung disease, bil BKA    Examination-Activity Limitations Bend;Carry;Hygiene/Grooming;Lift;Squat;Stairs;Stand;Toileting;Transfers    Examination-Participation Restrictions Cleaning;Community Activity;Driving;Laundry;Meal Prep;Shop;Yard Work    Merchant navy officer Stable/Uncomplicated    Rehab Potential Good    PT Frequency 2x / week    PT Duration 8 weeks    PT Treatment/Interventions ADLs/Self Care Home Management;Cryotherapy;Moist Heat;Balance training;Therapeutic exercise;Therapeutic activities;Functional mobility training;Stair training;Gait training;Neuromuscular re-education;Patient/family Brewing technologist;Wheelchair mobility training;Manual techniques;Energy conservation;Passive range of motion;Scar mobilization;Joint Manipulations    PT Next Visit Plan Did pt get walker? Did he increase wear time to 2 hours, 2x/day?  Be sure to use portable oxygen  with activities as does desat. Measure Vitals; Review skin care, prosthetic care, sock management again with patient. Gait training with RW. Begin HEP for strengthening and balance in standing. Will need to make sure pt does not push himself too quickly.    PT Home Exercise Plan --    Consulted and Agree with Plan of Care Patient;Family member/caregiver             Patient will benefit from skilled therapeutic intervention in order to improve the following deficits and impairments:  Abnormal gait, Cardiopulmonary status limiting activity, Decreased activity tolerance, Decreased balance, Decreased endurance, Decreased knowledge of precautions, Decreased mobility, Decreased range of motion, Difficulty walking, Decreased strength, Decreased scar mobility, Decreased skin integrity, Increased edema, Increased fascial restricitons, Impaired flexibility, Postural dysfunction, Improper body mechanics, Prosthetic Dependency, Pain, Decreased coordination  Visit Diagnosis: Other abnormalities of gait and mobility  Muscle weakness (generalized)     Problem List Patient Active Problem List   Diagnosis Date Noted   Wound dehiscence 03/02/2021   Subacute osteomyelitis, right ankle and foot (HCC)    Sleep disturbance    Essential hypertension    Diabetic peripheral neuropathy (HCC)    Labile blood glucose    Left below-knee amputee ([place]) 02/07/2021   PAD (peripheral artery disease) ([place]) 02/01/2021   Below-knee amputation of left lower extremity ([place]) 01/27/2021   S/P BKA (below knee amputation), left (HCC)    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HCC)    SOB (shortness of breath)    Open wound    Bacteremia    Chronic ulcer of left ankle (HCC)    Severe sepsis without septic shock (HCC)    COPD exacerbation (HCC) [id number]   Chronic systolic CHF (congestive heart failure) ([place]) 01/15/2021   Acute on chronic respiratory failure with hypoxia and hypercapnia ([place]) 01/15/2021   Acute renal  failure superimposed on stage 3 chronic kidney  disease ([place]) 01/15/2021   Elevated troponin 01/15/2021   Hyponatremia 01/15/2021   CHF exacerbation ([place]) 10/17/2020   Acute on chronic systolic CHF (congestive heart failure) ([place]) 10/16/2020   Acute on chronic combined systolic and diastolic CHF (congestive heart failure) ([place]) 03/15/2018   S/P right and left heart catheterization 11/02/2017   Panic attacks 11/02/2017   Non-STEMI (non-ST elevated myocardial infarction) ([place])    COPD (chronic obstructive pulmonary disease) ([place])    Hypertension    CKD (chronic kidney disease)    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[place])    Presence of implantable cardioverter-defibrillator (ICD)    GERD (gastroesophageal reflux disease)    Diabetes mellitus with diabetic neuropathy, with long-term current use of insulin ([place])     Electa Sniff, PT, DPT, NCS 07/08/2021, 1:48 PM  [facility] [address] [place], [place], [address] Phone: [phone]   Fax:  [phone]  Name: Richard [name] MRN: [id number] Date of Birth: September 22, 1960

## 2021-07-12 ENCOUNTER — Other Ambulatory Visit (HOSPITAL_COMMUNITY): Payer: Self-pay | Admitting: Cardiology

## 2021-07-14 ENCOUNTER — Other Ambulatory Visit: Payer: Self-pay

## 2021-07-14 ENCOUNTER — Ambulatory Visit: Payer: Medicare Other | Admitting: Physical Therapy

## 2021-07-14 ENCOUNTER — Encounter: Payer: Self-pay | Admitting: Physical Therapy

## 2021-07-14 DIAGNOSIS — Z89512 Acquired absence of left leg below knee: Secondary | ICD-10-CM | POA: Diagnosis not present

## 2021-07-14 DIAGNOSIS — M6281 Muscle weakness (generalized): Secondary | ICD-10-CM

## 2021-07-14 DIAGNOSIS — R2689 Other abnormalities of gait and mobility: Secondary | ICD-10-CM

## 2021-07-15 ENCOUNTER — Other Ambulatory Visit (HOSPITAL_COMMUNITY): Payer: Self-pay | Admitting: Cardiology

## 2021-07-15 NOTE — Therapy (Signed)
[facility] [address] [place], [place], [address] Phone: [phone]   Fax:  [phone]  Physical Therapy Treatment  Patient Details  Name: Richard [name] MRN: [id number] Date of Birth: 11-Jun-1961 Referring Provider (PT): Dr. Sharol [name]   Encounter Date: 07/14/2021   PT End of Session - 07/14/21 1538     Visit Number 3    Number of Visits 17    Date for PT Re-Evaluation 08/26/21    Authorization Type MC/MCD    Progress Note Due on Visit 10    PT Start Time [id number]    PT Stop Time 1613    PT Time Calculation (min) 40 min    Equipment Utilized During Treatment Gait belt;Oxygen   pt's personal oxygen tank at 3 lpm [name]   Activity Tolerance Patient tolerated treatment well    Behavior During Therapy [facility] for tasks assessed/performed             Past Medical History:  Diagnosis Date   AICD (automatic cardioverter/defibrillator) present 01/11/2017   Anxiety    CHF (congestive heart failure) (HCC)    CKD (chronic kidney disease)    COPD (chronic obstructive pulmonary disease) ([place])    never smoker, industrial exposure   Diabetes mellitus with diabetic neuropathy, with long-term current use of insulin (HCC)    GERD (gastroesophageal reflux disease)    High cholesterol    History of gout    "take RX qd" (11/01/2017)   Hypertension    Nonobstructive atherosclerosis of coronary artery    On home oxygen therapy    "2L prn" (11/01/2017)   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HCC)    LLL, present for years   Systolic HF (heart failure) ([place])     Past Surgical History:  Procedure Laterality Date   ABDOMINAL AORTOGRAM W/LOWER EXTREMITY N/A 02/01/2021   Procedure: ABDOMINAL AORTOGRAM W/LOWER EXTREMITY;  Surgeon: Serafina Mitchell, MD;  Location: [place] [facility];  Service: Cardiovascular;  Laterality: N/A;   AMPUTATION Left 01/21/2021   Procedure: AMPUTATION BELOW KNEE;  Surgeon: Newt Minion, MD;  Location: [place];  Service:  Orthopedics;  Laterality: Left;   AMPUTATION Right 02/04/2021   Procedure: RIGHT FOOT 1ST AND 2ND RAY AMPUTATION;  Surgeon: Newt Minion, MD;  Location: [place];  Service: Orthopedics;  Laterality: Right;   AMPUTATION Right 03/02/2021   Procedure: RIGHT BELOW KNEE AMPUTATION;  Surgeon: Newt Minion, MD;  Location: [facility];  Service: Orthopedics;  Laterality: Right;   APPLICATION OF WOUND VAC Left 01/21/2021   Procedure: APPLICATION OF WOUND VAC;  Surgeon: Newt Minion, MD;  Location: [facility];  Service: Orthopedics;  Laterality: Left;   APPLICATION OF WOUND VAC  02/04/2021   Procedure: APPLICATION OF WOUND VAC;  Surgeon: Newt Minion, MD;  Location: [facility];  Service: Orthopedics;;   APPLICATION OF WOUND VAC  03/02/2021   Procedure: APPLICATION OF WOUND VAC;  Surgeon: Newt Minion, MD;  Location: [facility];  Service: Orthopedics;;   CATARACT EXTRACTION W/ INTRAOCULAR LENS IMPLANTW/ TRABECULECTOMY Left ~ 2013   "may have done this when I had my other eye OR"   [place] N/A 11/02/2017   Procedure: CORONARY STENT INTERVENTION;  Surgeon: Larey Dresser, MD;  Location: [place] [facility];  Service: Cardiovascular;  Laterality: N/A;   CORONARY STENT INTERVENTION N/A 11/02/2017   Procedure: CORONARY STENT INTERVENTION;  Surgeon: Belva Crome, MD;  Location: [facility];  Service: Cardiovascular;  Laterality: N/A;   EYE SURGERY Left ~ 2013   "fell on something in basement; stuck in my eye"   ICD IMPLANT     Medtronic Dual Chamber 01/11/17   MULTIPLE TOOTH EXTRACTIONS     "pulled all my top teeth"   PERIPHERAL VASCULAR BALLOON ANGIOPLASTY  02/01/2021   Procedure: PERIPHERAL VASCULAR BALLOON ANGIOPLASTY;  Surgeon: Serafina Mitchell, MD;  Location: [place] [facility];  Service: Cardiovascular;;  [place] / PT   RIGHT/LEFT HEART CATH AND CORONARY ANGIOGRAPHY N/A 11/02/2017   Procedure: RIGHT/LEFT HEART CATH AND CORONARY ANGIOGRAPHY;  Surgeon: Larey Dresser, MD;   Location: [facility];  Service: Cardiovascular;  Laterality: N/A;   TEE WITHOUT CARDIOVERSION N/A 01/20/2021   Procedure: TRANSESOPHAGEAL ECHOCARDIOGRAM (TEE);  Surgeon: Larey Dresser, MD;  Location: [facility] ENDOSCOPY;  Service: Cardiovascular;  Laterality: N/A;    There were no vitals filed for this visit.   Subjective Assessment - 07/14/21 1535     Subjective No new complaints. No falls or pain to report. Has a full tank of oxygen today. Has the walker script (just picked it up) on way to session today. Hopeful to get it today at [facility] in [place].    Patient is accompained by: Family member    Pertinent History Bil BKA    Limitations Standing;Lifting;Walking;House hold activities    How long can you sit comfortably? no issues    How long can you stand comfortably? 5 min    How long can you walk comfortably? 10 meters    Patient Stated Goals Walk better    Currently in Pain? No/denies                    [facility] Adult PT Treatment/Exercise - 07/14/21 1539       Transfers   Transfers Sit to Stand;Stand to Sit    Sit to Stand 4: Min guard;With upper extremity assist;From bed;From chair/3-in-1    Stand to Sit 4: Min guard;Without upper extremity assist;To bed;To chair/3-in-1      Ambulation/Gait   Ambulation/Gait Yes    Ambulation/Gait Assistance 4: Min guard    Ambulation/Gait Assistance Details SaO2 84-85% on RA. Donned 3 lpm via pt's O2 tank with increased sats to 96-97%. after gait 96-97% with mild shortness of breath reported. cues to increase base of support and for step length as prosthetic foot right>left caught at times with swing phases. on 2cd rep less episodes of foot catching with improved posture noted. SaO2 95-96% on the 3 lpm [place].    Ambulation Distance (Feet) 120 Feet   x2 reps   Assistive device Rolling walker;Prostheses    Gait Pattern Step-through pattern;Decreased stride length;Narrow base of support    Ambulation Surface Level;Indoor      Prosthetics    Prosthetic Care Comments  pt with 3 ply on right, none on left. worked on donning left prosthesis as this is the one he has issues with. Pt able to do so with supervision x2 reps with cues on proper hip/knee relationship and alignment of limb in socket. no socks needed as pt's patella in good placement when checked in standing; reinforced consistency with wearing prostheses and that sock ply needed will varry day to day and throughout day.    Current prosthetic wear tolerance (days/week)  daily    Current prosthetic wear tolerance (#hours/day)  2 hours 2x day    Residual limb condition  pt reports no changes,  intact    Education Provided Residual limb care;Correct ply sock adjustment;Proper Donning;Proper wear schedule/adjustment;Proper weight-bearing schedule/adjustment    Person(s) Educated Patient;Spouse    Education Method Explanation;Demonstration;Verbal cues    Education Method Verbalized understanding;Returned demonstration;Verbal cues required;Needs further instruction    Donning Prosthesis Supervision;Minimal assist    Doffing Prosthesis Supervision;Minimal assist                       PT Short Term Goals - 07/08/21 1337       PT SHORT TERM GOAL #1   Title Patient will be able to ambulate 230' with RW and CGA with SpO2 stats >90% with 2 L of external O2 to improve walking endurance    Baseline 20 L (without O2, SpO2 dropped to 75% from 95% at rest)(07/01/21)    Time 4    Period Weeks    Status New    Target Date 07/29/21      PT SHORT TERM GOAL #2   Title Pt will be able to perform sit to stand from bariatric chair with one UE support    Baseline Bil heavy UE support  with sit to stand (07/01/21)    Time 4    Period Weeks    Status New    Target Date 07/29/21      PT SHORT TERM GOAL #3   Title Patient will verbalize proper skin check, prosthetic limb care and sock management to improve comfort and safety with use of prosthetic limb with WB activities.    Baseline  Education initiated (07/01/21)    Time 4    Period Weeks    Status New    Target Date 07/29/21      PT SHORT TERM GOAL #4   Title Pt will decrease TUG from 45 sec to <30 sec for improved balance and functional mobility.    Baseline baseline 45 sed    Time 4    Period Weeks    Status New    Target Date 07/29/21               PT Long Term Goals - 07/08/21 1338       PT LONG TERM GOAL #1   Title Pt will be able to ambulate 500' with LRAD (with or without external O2) while maintaining SpO2 >90% on level ground with SBA to improve community ambulation    Baseline 10 meters with RW CGA (07/01/21)    Time 8    Period Weeks    Status New    Target Date 08/26/21      PT LONG TERM GOAL #2   Title Pt will be able to go up and down 12 steps with one rail to be able to access second floor at home    Baseline Pt lives on main level (07/01/21)    Time 8    Period Weeks    Status New    Target Date 08/26/21      PT LONG TERM GOAL #3   Title Pt will be able to ambulate 200' on grass to be able to [employer] terrain.    Baseline Not attempted (07/01/21)    Time 8    Period Weeks    Status New    Target Date 08/26/21      PT LONG TERM GOAL #4   Title Pt will increase Berg to >35/56 for improved balance and decreased fall risk.    Baseline 07/08/21 18/56  Time 8    Period Weeks    Status New    Target Date 08/26/21      PT LONG TERM GOAL #5   Title Pt will be able to tolerate wearing prostheses 8 hours/day or more with no skin issues for improved function.    Time 8    Period Weeks    Status New    Target Date 08/26/21                   Plan - 07/14/21 1539     Clinical Impression Statement Today's skilled session continued to focus on prosthetic education and transfers/gait with prosthesis/RW. Pt with low SaO2 at rest on RA. Improved with donning 3 lpm via [place]. Pt has monitor at home, does not check sats "goes by feeling". Discussed that post bil BKA his body  will fatigue easier due to decreased circulatory system which will impact his respiratory system as well. Pt then reports he has "spells" where he "talks out of his head" at home that his spouse calls him out on. Discussed potential for his O2 levels to be low and that he should be montoring them to ensure this is not happening and that he should also mention this to his doctors. Pt and spouse verbalized understanding and agreed. The pt should benefit from continued PT to progress toward unmet goals.    Personal Factors and Comorbidities Comorbidity 2    Comorbidities Lung disease, bil BKA    Examination-Activity Limitations Bend;Carry;Hygiene/Grooming;Lift;Squat;Stairs;Stand;Toileting;Transfers    Examination-Participation Restrictions Cleaning;Community Activity;Driving;Laundry;Meal Prep;Shop;Yard Work    Conservation officer, historic buildings Stable/Uncomplicated    Rehab Potential Good    PT Frequency 2x / week    PT Duration 8 weeks    PT Treatment/Interventions ADLs/Self Care Home Management;Cryotherapy;Moist Heat;Balance training;Therapeutic exercise;Therapeutic activities;Functional mobility training;Stair training;Gait training;Neuromuscular re-education;Patient/family Producer, television/film/video;Wheelchair mobility training;Manual techniques;Energy conservation;Passive range of motion;Scar mobilization;Joint Manipulations    PT Next Visit Plan Did pt get walker? Be sure to use portable oxygen with activities as does desat. Measure Vitals; Review skin care, prosthetic care, sock management again with patient. Gait training with RW. Begin HEP for strengthening and balance in standing. Will need to make sure pt does not push himself too quickly.    Consulted and Agree with Plan of Care Patient;Family member/caregiver             Patient will benefit from skilled therapeutic intervention in order to improve the following deficits and impairments:  Abnormal gait, Cardiopulmonary status limiting  activity, Decreased activity tolerance, Decreased balance, Decreased endurance, Decreased knowledge of precautions, Decreased mobility, Decreased range of motion, Difficulty walking, Decreased strength, Decreased scar mobility, Decreased skin integrity, Increased edema, Increased fascial restricitons, Impaired flexibility, Postural dysfunction, Improper body mechanics, Prosthetic Dependency, Pain, Decreased coordination  Visit Diagnosis: Other abnormalities of gait and mobility  Muscle weakness (generalized)     Problem List Patient Active Problem List   Diagnosis Date Noted   Wound dehiscence 03/02/2021   Subacute osteomyelitis, right ankle and foot (HCC)    Sleep disturbance    Essential hypertension    Diabetic peripheral neuropathy (HCC)    Labile blood glucose    Left below-knee amputee (HCC) 02/07/2021   PAD (peripheral artery disease) (HCC) 02/01/2021   Below-knee amputation of left lower extremity (HCC) 01/27/2021   S/P BKA (below knee amputation), left (HCC)    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HCC)    SOB (shortness of breath)    Open wound  Bacteremia    Chronic ulcer of left ankle (HCC)    Severe sepsis without septic shock (HCC)    COPD exacerbation (HCC) [id number]   Chronic systolic CHF (congestive heart failure) ([place]) 01/15/2021   Acute on chronic respiratory failure with hypoxia and hypercapnia ([place]) 01/15/2021   Acute renal failure superimposed on stage 3 chronic kidney disease (HCC) 01/15/2021   Elevated troponin 01/15/2021   Hyponatremia 01/15/2021   CHF exacerbation ([place]) 10/17/2020   Acute on chronic systolic CHF (congestive heart failure) ([place]) 10/16/2020   Acute on chronic combined systolic and diastolic CHF (congestive heart failure) ([place]) 03/15/2018   S/P right and left heart catheterization 11/02/2017   Panic attacks 11/02/2017   Non-STEMI (non-ST elevated myocardial infarction) ([place])    COPD (chronic obstructive pulmonary disease) ([place])     Hypertension    CKD (chronic kidney disease)    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[place])    Presence of implantable cardioverter-defibrillator (ICD)    GERD (gastroesophageal reflux disease)    Diabetes mellitus with diabetic neuropathy, with long-term current use of insulin ([place])     Willow Ora, PTA, [facility] [address] [place], [place] [address] [phone] 07/15/21, 1:32 PM   Name: Richard [name] MRN: [id number] Date of Birth: 1961/01/09

## 2021-07-20 ENCOUNTER — Ambulatory Visit: Payer: Medicare Other | Admitting: Physical Therapy

## 2021-07-22 ENCOUNTER — Ambulatory Visit: Payer: Medicare Other

## 2021-07-27 ENCOUNTER — Ambulatory Visit: Payer: Medicare Other

## 2021-07-29 ENCOUNTER — Ambulatory Visit: Payer: Medicare Other | Attending: Physician Assistant

## 2021-07-29 ENCOUNTER — Other Ambulatory Visit: Payer: Self-pay

## 2021-07-29 DIAGNOSIS — Z89512 Acquired absence of left leg below knee: Secondary | ICD-10-CM | POA: Insufficient documentation

## 2021-07-29 DIAGNOSIS — R2689 Other abnormalities of gait and mobility: Secondary | ICD-10-CM | POA: Diagnosis not present

## 2021-07-29 DIAGNOSIS — M6281 Muscle weakness (generalized): Secondary | ICD-10-CM | POA: Diagnosis present

## 2021-07-29 DIAGNOSIS — S88112A Complete traumatic amputation at level between knee and ankle, left lower leg, initial encounter: Secondary | ICD-10-CM | POA: Diagnosis present

## 2021-07-29 DIAGNOSIS — Z89511 Acquired absence of right leg below knee: Secondary | ICD-10-CM | POA: Diagnosis present

## 2021-07-29 NOTE — Therapy (Signed)
[facility] [address] [place], [place], [address] Phone: [phone]   Fax:  [phone]  Physical Therapy Treatment  Patient Details  Name: Richard [name] MRN: [id number] Date of Birth: Jun 07, 1961 Referring Provider (PT): Dr. Sharol Given   Encounter Date: 07/29/2021   PT End of Session - 07/29/21 1454     Visit Number 4    Number of Visits 17    Date for PT Re-Evaluation 08/26/21    Authorization Type MC/MCD    Progress Note Due on Visit 10    PT Start Time 1400    PT Stop Time 1445    PT Time Calculation (min) 45 min    Equipment Utilized During Treatment Gait belt;Oxygen   pt's personal oxygen tank at 3 lpm [address]   Activity Tolerance Patient tolerated treatment well    Behavior During Therapy [facility] for tasks assessed/performed             Past Medical History:  Diagnosis Date   AICD (automatic cardioverter/defibrillator) present 01/11/2017   Anxiety    CHF (congestive heart failure) (HCC)    CKD (chronic kidney disease)    COPD (chronic obstructive pulmonary disease) ([place])    never smoker, industrial exposure   Diabetes mellitus with diabetic neuropathy, with long-term current use of insulin (HCC)    GERD (gastroesophageal reflux disease)    High cholesterol    History of gout    "take RX qd" (11/01/2017)   Hypertension    Nonobstructive atherosclerosis of coronary artery    On home oxygen therapy    "2L prn" (11/01/2017)   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HCC)    LLL, present for years   Systolic HF (heart failure) ([place])     Past Surgical History:  Procedure Laterality Date   ABDOMINAL AORTOGRAM W/LOWER EXTREMITY N/A 02/01/2021   Procedure: ABDOMINAL AORTOGRAM W/LOWER EXTREMITY;  Surgeon: Serafina Mitchell, MD;  Location: [facility];  Service: Cardiovascular;  Laterality: N/A;   AMPUTATION Left 01/21/2021   Procedure: AMPUTATION BELOW KNEE;  Surgeon: Newt Minion, MD;  Location: [facility];  Service: Orthopedics;   Laterality: Left;   AMPUTATION Right 02/04/2021   Procedure: RIGHT FOOT 1ST AND 2ND RAY AMPUTATION;  Surgeon: Newt Minion, MD;  Location: [facility];  Service: Orthopedics;  Laterality: Right;   AMPUTATION Right 03/02/2021   Procedure: RIGHT BELOW KNEE AMPUTATION;  Surgeon: Newt Minion, MD;  Location: [facility];  Service: Orthopedics;  Laterality: Right;   APPLICATION OF WOUND VAC Left 01/21/2021   Procedure: APPLICATION OF WOUND VAC;  Surgeon: Newt Minion, MD;  Location: [facility];  Service: Orthopedics;  Laterality: Left;   APPLICATION OF WOUND VAC  02/04/2021   Procedure: APPLICATION OF WOUND VAC;  Surgeon: Newt Minion, MD;  Location: [facility];  Service: Orthopedics;;   APPLICATION OF WOUND VAC  03/02/2021   Procedure: APPLICATION OF WOUND VAC;  Surgeon: Newt Minion, MD;  Location: [facility];  Service: Orthopedics;;   CATARACT EXTRACTION W/ INTRAOCULAR LENS IMPLANTW/ TRABECULECTOMY Left ~ 2013   "may have done this when I had my other eye OR"   [place] N/A 11/02/2017   Procedure: CORONARY STENT INTERVENTION;  Surgeon: Larey Dresser, MD;  Location: [facility];  Service: Cardiovascular;  Laterality: N/A;   CORONARY STENT INTERVENTION N/A 11/02/2017   Procedure: CORONARY STENT INTERVENTION;  Surgeon: Belva Crome, MD;  Location: [facility];  Service: Cardiovascular;  Laterality: N/A;   EYE SURGERY Left ~ 2013   "fell on something in basement; stuck in my eye"   ICD IMPLANT     Medtronic Dual Chamber 01/11/17   MULTIPLE TOOTH EXTRACTIONS     "pulled all my top teeth"   PERIPHERAL VASCULAR BALLOON ANGIOPLASTY  02/01/2021   Procedure: PERIPHERAL VASCULAR BALLOON ANGIOPLASTY;  Surgeon: Serafina Mitchell, MD;  Location: [facility];  Service: Cardiovascular;;  [facility] [place] / PT   RIGHT/LEFT HEART CATH AND CORONARY ANGIOGRAPHY N/A 11/02/2017   Procedure: RIGHT/LEFT HEART CATH AND CORONARY ANGIOGRAPHY;  Surgeon: Larey Dresser, MD;  Location: [facility];  Service: Cardiovascular;  Laterality: N/A;   TEE WITHOUT CARDIOVERSION N/A 01/20/2021   Procedure: TRANSESOPHAGEAL ECHOCARDIOGRAM (TEE);  Surgeon: Larey Dresser, MD;  Location: [facility];  Service: Cardiovascular;  Laterality: N/A;    There were no vitals filed for this visit.   Subjective Assessment - 07/29/21 1454     Subjective I just got my walker today. No new complaints. I have been wearing legs for 2 hours and sometimes little more, 2x/day. Someimes I go to sleep with it.    Patient is accompained by: Family member    Pertinent History Bil BKA    Limitations Standing;Lifting;Walking;House hold activities    How long can you sit comfortably? no issues    How long can you stand comfortably? 5 min    How long can you walk comfortably? 10 meters    Patient Stated Goals Walk better    Currently in Pain? No/denies                         Tennis balls put on patient's walker to improve glide on surface   Pt on 4L of O2 BP: 121/91, 81pbm, 94% SpO2 Gait training: 1 x 230' with RW CGA to min Awith 4 L of O2, O2 monitored during the walk needed one standign break because pt was feeling lightheaded slightly, pt was given cues throughout walking to not hold his breath and then perform pursed lip breathing. Pt maintained 97-99% SpO2 during walking. 5 min break For second trial of gait, O2 was reduced to 3L, 1 x 230' with RW CGA to min A, no resting breaks, O2 monitored throughout the walk, pt maintained O2 levels around 92% and when it went below 90%, pt was cued to slow down his cadence and focusing on pursed lip breathing 10 min break Stairs: bil hand rails, reciprocal steps, 8 steps   Reviewed sock management with patients. Pt educated to try using prosthetic legs and walker for in home mobility instead of relying on wheelchair as much. Pt educated on not going to sleep with his prosthetic legs.          PT Short Term Goals - 07/29/21 1456        PT SHORT TERM GOAL #1   Title Patient will be able to ambulate 230' with RW and CGA with SpO2 stats >90% with 2 L of external O2 to improve walking endurance    Baseline 20 L (without O2, SpO2 dropped to 75% from 95% at rest)(07/01/21); 230' x 2 with RW with CGA to min A with 3 L of O2 with 92% spO2 (07/29/21)    Time 4    Period Weeks    Status On-going    Target Date 07/29/21      PT SHORT  TERM GOAL #2   Title Pt will be able to perform sit to stand from bariatric chair with one UE support    Baseline Bil heavy UE support  with sit to stand (07/01/21); bil UE support required 07/29/21    Time 4    Period Weeks    Status New    Target Date 07/29/21      PT SHORT TERM GOAL #3   Title Patient will verbalize proper skin check, prosthetic limb care and sock management to improve comfort and safety with use of prosthetic limb with WB activities.    Baseline Education initiated (07/01/21)    Time 4    Period Weeks    Status On-going    Target Date 07/29/21      PT SHORT TERM GOAL #4   Title Pt will decrease TUG from 45 sec to <30 sec for improved balance and functional mobility.    Baseline baseline 45 sed    Time 4    Period Weeks    Status New    Target Date 07/29/21               PT Long Term Goals - 07/08/21 1338       PT LONG TERM GOAL #1   Title Pt will be able to ambulate 500' with LRAD (with or without external O2) while maintaining SpO2 >90% on level ground with SBA to improve community ambulation    Baseline 10 meters with RW CGA (07/01/21)    Time 8    Period Weeks    Status New    Target Date 08/26/21      PT LONG TERM GOAL #2   Title Pt will be able to go up and down 12 steps with one rail to be able to access second floor at home    Baseline Pt lives on main level (07/01/21)    Time 8    Period Weeks    Status New    Target Date 08/26/21      PT LONG TERM GOAL #3   Title Pt will be able to ambulate 200' on grass to be able to [employer] terrain.     Baseline Not attempted (07/01/21)    Time 8    Period Weeks    Status New    Target Date 08/26/21      PT LONG TERM GOAL #4   Title Pt will increase Berg to >35/56 for improved balance and decreased fall risk.    Baseline 07/08/21 18/56    Time 8    Period Weeks    Status New    Target Date 08/26/21      PT LONG TERM GOAL #5   Title Pt will be able to tolerate wearing prostheses 8 hours/day or more with no skin issues for improved function.    Time 8    Period Weeks    Status New    Target Date 08/26/21                   Plan - 07/29/21 1457     Clinical Impression Statement Todays session focused on improving gait endurance and distance with RW and supplemental O2. Patient was able to maintain SpO2 at 92% with 3 L of O2. Pt has bil hip internal rotation which causes slight pigeon toes during standing and walking and pt required cues to maintain visual contact with his feet to prevent from tripping over his own shoes. Sock  fit was assessed and patient had proper amoutn of ply socks in bil legs (4 plys bil) and socket was securely fit on his residual legs. Pt demonstrated increased gait endurance today. we inited steps and pt able to go up and down steps with bil UE support with reciprocal gait but requires CGA    Personal Factors and Comorbidities Comorbidity 2    Comorbidities Lung disease, bil BKA    Examination-Activity Limitations Bend;Carry;Hygiene/Grooming;Lift;Squat;Stairs;Stand;Toileting;Transfers    Examination-Participation Restrictions Cleaning;Community Activity;Driving;Laundry;Meal Prep;Shop;Yard Work    Merchant navy officer Stable/Uncomplicated    Rehab Potential Good    PT Frequency 2x / week    PT Duration 8 weeks    PT Treatment/Interventions ADLs/Self Care Home Management;Cryotherapy;Moist Heat;Balance training;Therapeutic exercise;Therapeutic activities;Functional mobility training;Stair training;Gait training;Neuromuscular  re-education;Patient/family Brewing technologist;Wheelchair mobility training;Manual techniques;Energy conservation;Passive range of motion;Scar mobilization;Joint Manipulations    PT Next Visit Plan Continue to work on improving gait endurance with walker, monitor vitals during gait, continue to use 3 L of O2 during activity. Practice stairs, work on sit to stand from elevated surfaces without 1 UE to no UE support    Consulted and Agree with Plan of Care Patient;Family member/caregiver             Patient will benefit from skilled therapeutic intervention in order to improve the following deficits and impairments:  Abnormal gait, Cardiopulmonary status limiting activity, Decreased activity tolerance, Decreased balance, Decreased endurance, Decreased knowledge of precautions, Decreased mobility, Decreased range of motion, Difficulty walking, Decreased strength, Decreased scar mobility, Decreased skin integrity, Increased edema, Increased fascial restricitons, Impaired flexibility, Postural dysfunction, Improper body mechanics, Prosthetic Dependency, Pain, Decreased coordination  Visit Diagnosis: Other abnormalities of gait and mobility  Muscle weakness (generalized)  Hx of BKA, left (HCC)  Hx of BKA, right (HCC)  Below-knee amputation of left lower extremity ([place])     Problem List Patient Active Problem List   Diagnosis Date Noted   Wound dehiscence 03/02/2021   Subacute osteomyelitis, right ankle and foot (HCC)    Sleep disturbance    Essential hypertension    Diabetic peripheral neuropathy (HCC)    Labile blood glucose    Left below-knee amputee ([place]) 02/07/2021   PAD (peripheral artery disease) (HCC) 02/01/2021   Below-knee amputation of left lower extremity ([place]) 01/27/2021   S/P BKA (below knee amputation), left (HCC)    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HCC)    SOB (shortness of breath)    Open wound    Bacteremia    Chronic ulcer of left ankle (HCC)     Severe sepsis without septic shock (HCC)    COPD exacerbation (HCC) [id number]   Chronic systolic CHF (congestive heart failure) (HCC) 01/15/2021   Acute on chronic respiratory failure with hypoxia and hypercapnia (HCC) 01/15/2021   Acute renal failure superimposed on stage 3 chronic kidney disease (HCC) 01/15/2021   Elevated troponin 01/15/2021   Hyponatremia 01/15/2021   CHF exacerbation (HCC) 10/17/2020   Acute on chronic systolic CHF (congestive heart failure) ([place]) 10/16/2020   Acute on chronic combined systolic and diastolic CHF (congestive heart failure) ([place]) 03/15/2018   S/P right and left heart catheterization 11/02/2017   Panic attacks 11/02/2017   Non-STEMI (non-ST elevated myocardial infarction) ([place])    COPD (chronic obstructive pulmonary disease) (HCC)    Hypertension    CKD (chronic kidney disease)    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HCC)    Presence of implantable cardioverter-defibrillator (ICD)    GERD (gastroesophageal reflux disease)  Diabetes mellitus with diabetic neuropathy, with long-term current use of insulin ([place])     Kerrie Pleasure, PT 07/29/2021, 3:01 PM  [facility] [address] [place], [place], [address] Phone: [phone]   Fax:  [phone]  Name: Maynard Pinta MRN: [id number] Date of Birth: [birth year]

## 2021-08-03 ENCOUNTER — Ambulatory Visit: Payer: Medicare Other | Admitting: Physical Therapy

## 2021-08-03 ENCOUNTER — Other Ambulatory Visit: Payer: Self-pay

## 2021-08-03 ENCOUNTER — Encounter: Payer: Self-pay | Admitting: Physical Therapy

## 2021-08-03 DIAGNOSIS — M6281 Muscle weakness (generalized): Secondary | ICD-10-CM

## 2021-08-03 DIAGNOSIS — R2689 Other abnormalities of gait and mobility: Secondary | ICD-10-CM

## 2021-08-03 NOTE — Therapy (Signed)
[facility] [address] [place], [place], [address] Phone: [phone]   Fax:  [phone]  Physical Therapy Treatment  Patient Details  Name: Richard [name] MRN: [id number] Date of Birth: 03/26/1961 Referring Provider (PT): Dr. Lajoyce Corners   Encounter Date: 08/03/2021   PT End of Session - 08/03/21 1701     Visit Number 5    Number of Visits 17    Date for PT Re-Evaluation 08/26/21    Authorization Type [facility]/MCD    Progress Note Due on Visit 10    PT Start Time 1532    PT Stop Time 1614    PT Time Calculation (min) 42 min    Equipment Utilized During Treatment Gait belt    Activity Tolerance Patient tolerated treatment well    Behavior During Therapy [facility] for tasks assessed/performed             Past Medical History:  Diagnosis Date   AICD (automatic cardioverter/defibrillator) present 01/11/2017   Anxiety    CHF (congestive heart failure) (HCC)    CKD (chronic kidney disease)    COPD (chronic obstructive pulmonary disease) (HCC)    never smoker, industrial exposure   Diabetes mellitus with diabetic neuropathy, with long-term current use of insulin (HCC)    GERD (gastroesophageal reflux disease)    High cholesterol    History of gout    "take RX qd" (11/01/2017)   Hypertension    Nonobstructive atherosclerosis of coronary artery    On home oxygen therapy    "2L prn" (11/01/2017)   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HCC)    LLL, present for years   Systolic HF (heart failure) (HCC)     Past Surgical History:  Procedure Laterality Date   ABDOMINAL AORTOGRAM W/LOWER EXTREMITY N/A 02/01/2021   Procedure: ABDOMINAL AORTOGRAM W/LOWER EXTREMITY;  Surgeon: Nada Libman, MD;  [facility];  Service: Cardiovascular;  Laterality: N/A;   AMPUTATION Left 01/21/2021   Procedure: AMPUTATION BELOW KNEE;  Surgeon: Nadara Mustard, MD;  Location: [facility] OR;  Service: Orthopedics;  Laterality: Left;   AMPUTATION Right  02/04/2021   Procedure: RIGHT FOOT 1ST AND 2ND RAY AMPUTATION;  Surgeon: Nadara Mustard, MD;  Location: [facility] OR;  Service: Orthopedics;  Laterality: Right;   AMPUTATION Right 03/02/2021   Procedure: RIGHT BELOW KNEE AMPUTATION;  Surgeon: Nadara Mustard, MD;  Location: [facility] OR;  Service: Orthopedics;  Laterality: Right;   APPLICATION OF WOUND VAC Left 01/21/2021   Procedure: APPLICATION OF WOUND VAC;  Surgeon: Nadara Mustard, MD;  [facility];  Service: Orthopedics;  Laterality: Left;   APPLICATION OF WOUND VAC  02/04/2021   Procedure: APPLICATION OF WOUND VAC;  Surgeon: Nadara Mustard, MD;  Location: [facility] OR;  Service: Orthopedics;;   APPLICATION OF WOUND VAC  03/02/2021   Procedure: APPLICATION OF WOUND VAC;  Surgeon: Nadara Mustard, MD;  [facility];  Service: Orthopedics;;   CATARACT EXTRACTION W/ INTRAOCULAR LENS IMPLANTW/ TRABECULECTOMY Left ~ 2013   "may have done this when I had my other eye OR"   CIRCUMCISION     CORONARY STENT INTERVENTION N/A 11/02/2017   Procedure: CORONARY STENT INTERVENTION;  Surgeon: Laurey Morale, MD;  [facility];  Service: Cardiovascular;  Laterality: N/A;   CORONARY STENT INTERVENTION N/A 11/02/2017   Procedure: CORONARY STENT INTERVENTION;  Surgeon: Lyn Records, MD;  [facility];  Service: Cardiovascular;  Laterality: N/A;  EYE SURGERY Left ~ 2013   "fell on something in basement; stuck in my eye"   ICD IMPLANT     Medtronic Dual Chamber 01/11/17   MULTIPLE TOOTH EXTRACTIONS     "pulled all my top teeth"   PERIPHERAL VASCULAR BALLOON ANGIOPLASTY  02/01/2021   Procedure: PERIPHERAL VASCULAR BALLOON ANGIOPLASTY;  Surgeon: Serafina Mitchell, MD;  Location: [place] CV LAB;  Service: Cardiovascular;;  [facility] / PT   RIGHT/LEFT HEART CATH AND CORONARY ANGIOGRAPHY N/A 11/02/2017   Procedure: RIGHT/LEFT HEART CATH AND CORONARY ANGIOGRAPHY;  Surgeon: Larey Dresser, MD;  Location: [place] CV LAB;  Service: Cardiovascular;   Laterality: N/A;   TEE WITHOUT CARDIOVERSION N/A 01/20/2021   Procedure: TRANSESOPHAGEAL ECHOCARDIOGRAM (TEE);  Surgeon: Larey Dresser, MD;  Location: [facility] ENDOSCOPY;  Service: Cardiovascular;  Laterality: N/A;    There were no vitals filed for this visit.   Subjective Assessment - 08/03/21 1538     Subjective No new complaints. No falls. Does have some LE pain. Has appt on 08/22/21 with prosthetist.    Patient is accompained by: Family member    Pertinent History Bil BKA    Limitations Standing;Lifting;Walking;House hold activities    How long can you sit comfortably? no issues    How long can you stand comfortably? 5 min    How long can you walk comfortably? 10 meters    Patient Stated Goals Walk better    Currently in Pain? Yes    Pain Score 4     Pain Location Leg    Pain Orientation Right;Left    Pain Type Acute pain    Pain Onset In the past 7 days    Pain Frequency Intermittent    Aggravating Factors  weight bearing through prostheses    Pain Relieving Factors resting, sitting down to take weight off legs                   [facility] Adult PT Treatment/Exercise - 08/03/21 1543       Transfers   Transfers Sit to Stand;Stand to Sit    Sit to Stand 4: Min guard;With upper extremity assist;From bed;From chair/3-in-1    Stand to Sit 4: Min guard;Without upper extremity assist;To bed;To chair/3-in-1      Ambulation/Gait   Ambulation/Gait Yes    Ambulation/Gait Assistance 4: Min guard    Ambulation/Gait Assistance Details 100 feet from lobby into clinic with RW. SaO2 95-96% afterwards on RA.  115 feet x1 with RW with SaO2 88% afterwards. Rebounded within 60 seconds of sitting with pursed lip breathing. 230 x1 with RW with SaO2 91-92% afterwards, increased to 95-96% within a few minutes of slow, deep breathing.    Ambulation Distance (Feet) 100 Feet   x1, 115 x1   Assistive device Rolling walker;Prostheses    Gait Pattern Step-through pattern;Decreased stride length;Narrow  base of support    Ambulation Surface Indoor    Gait Comments cues with all bouts of gait for posture, increased base of support and for increased step length bil side for heel>toe step progression.      Knee/Hip Exercises: Aerobic   Other Aerobic Scifit UE/LE's level 2.5 for 5 mintues with goal 75-85 steps per minute. focus on slow, deep breathing throughout. SaO2 87-88% afterwards on RA, recovered to >/= 90% withing ~40 seconds. 5 minute seated rest break working on breath control. 95-96% after 5 minute rest. then repeated 5 minutes of work as stated above. continue with cues on  slow, deep breathing. SaO2 87-88% afterwards with recovery to >/= 90% within 20 seconds and 95-96% within ~60 secones.      Prosthetics   Current prosthetic wear tolerance (days/week)  daily    Current prosthetic wear tolerance (#hours/day)  2 hours 2x day. pt to work towards 3 hours 2x a day starting tomorrow.    Residual limb condition  intact per pt report.    Donning Prosthesis Supervision;Minimal assist    Doffing Prosthesis Supervision                       PT Short Term Goals - 07/29/21 1456       PT SHORT TERM GOAL #1   Title Patient will be able to ambulate 230' with RW and CGA with SpO2 stats >90% with 2 L of external O2 to improve walking endurance    Baseline 20 L (without O2, SpO2 dropped to 75% from 95% at rest)(07/01/21); 230' x 2 with RW with CGA to min A with 3 L of O2 with 92% spO2 (07/29/21)    Time 4    Period Weeks    Status On-going    Target Date 07/29/21      PT SHORT TERM GOAL #2   Title Pt will be able to perform sit to stand from bariatric chair with one UE support    Baseline Bil heavy UE support  with sit to stand (07/01/21); bil UE support required 07/29/21    Time 4    Period Weeks    Status New    Target Date 07/29/21      PT SHORT TERM GOAL #3   Title Patient will verbalize proper skin check, prosthetic limb care and sock management to improve comfort and safety with  use of prosthetic limb with WB activities.    Baseline Education initiated (07/01/21)    Time 4    Period Weeks    Status On-going    Target Date 07/29/21      PT SHORT TERM GOAL #4   Title Pt will decrease TUG from 45 sec to <30 sec for improved balance and functional mobility.    Baseline baseline 45 sed    Time 4    Period Weeks    Status New    Target Date 07/29/21               PT Long Term Goals - 07/08/21 1338       PT LONG TERM GOAL #1   Title Pt will be able to ambulate 500' with LRAD (with or without external O2) while maintaining SpO2 >90% on level ground with SBA to improve community ambulation    Baseline 10 meters with RW CGA (07/01/21)    Time 8    Period Weeks    Status New    Target Date 08/26/21      PT LONG TERM GOAL #2   Title Pt will be able to go up and down 12 steps with one rail to be able to access second floor at home    Baseline Pt lives on main level (07/01/21)    Time 8    Period Weeks    Status New    Target Date 08/26/21      PT LONG TERM GOAL #3   Title Pt will be able to ambulate 200' on grass to be able to [employer] terrain.    Baseline Not attempted (07/01/21)    Time 8  Period Weeks    Status New    Target Date 08/26/21      PT LONG TERM GOAL #4   Title Pt will increase Berg to >35/56 for improved balance and decreased fall risk.    Baseline 07/08/21 18/56    Time 8    Period Weeks    Status New    Target Date 08/26/21      PT LONG TERM GOAL #5   Title Pt will be able to tolerate wearing prostheses 8 hours/day or more with no skin issues for improved function.    Time 8    Period Weeks    Status New    Target Date 08/26/21                   Plan - 08/03/21 1701     Clinical Impression Statement Today's skilled session continue to focus on gait training with bil prostheses and activity tolerance. Pt's O2 sats were WNL's after walking from lobby to clinic, therefore continued to monitor pt's responce to  activity today on RA as pt does not always use his oxygen at home. Lowest decrease was to 87-88% with Scifit with quick recover time. Pt reported mild shortness of breath with activity that improved with rest breaks. No other issues noted or reported in session. The pt is progressing and should benefit from continued PT to progress toward unmet goals.    Personal Factors and Comorbidities Comorbidity 2    Comorbidities Lung disease, bil BKA    Examination-Activity Limitations Bend;Carry;Hygiene/Grooming;Lift;Squat;Stairs;Stand;Toileting;Transfers    Examination-Participation Restrictions Cleaning;Community Activity;Driving;Laundry;Meal Prep;Shop;Yard Work    Merchant navy officer Stable/Uncomplicated    Rehab Potential Good    PT Frequency 2x / week    PT Duration 8 weeks    PT Treatment/Interventions ADLs/Self Care Home Management;Cryotherapy;Moist Heat;Balance training;Therapeutic exercise;Therapeutic activities;Functional mobility training;Stair training;Gait training;Neuromuscular re-education;Patient/family Brewing technologist;Wheelchair mobility training;Manual techniques;Energy conservation;Passive range of motion;Scar mobilization;Joint Manipulations    PT Next Visit Plan Continue to work on improving gait endurance with walker, monitor vitals during gait, continue to use 3 L of O2 during activity as needed. Practice stairs, work on sit to stand from elevated surfaces without 1 UE to no UE support    Consulted and Agree with Plan of Care Patient;Family member/caregiver             Patient will benefit from skilled therapeutic intervention in order to improve the following deficits and impairments:  Abnormal gait, Cardiopulmonary status limiting activity, Decreased activity tolerance, Decreased balance, Decreased endurance, Decreased knowledge of precautions, Decreased mobility, Decreased range of motion, Difficulty walking, Decreased strength, Decreased scar  mobility, Decreased skin integrity, Increased edema, Increased fascial restricitons, Impaired flexibility, Postural dysfunction, Improper body mechanics, Prosthetic Dependency, Pain, Decreased coordination  Visit Diagnosis: Other abnormalities of gait and mobility  Muscle weakness (generalized)     Problem List Patient Active Problem List   Diagnosis Date Noted   Wound dehiscence 03/02/2021   Subacute osteomyelitis, right ankle and foot (HCC)    Sleep disturbance    Essential hypertension    Diabetic peripheral neuropathy (HCC)    Labile blood glucose    Left below-knee amputee ([place]) 02/07/2021   PAD (peripheral artery disease) ([place]) 02/01/2021   Below-knee amputation of left lower extremity ([place]) 01/27/2021   S/P BKA (below knee amputation), left (HCC)    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HCC)    SOB (shortness of breath)    Open wound    Bacteremia  Chronic ulcer of left ankle (HCC)    Severe sepsis without septic shock (HCC)    COPD exacerbation (HCC) [id number]   Chronic systolic CHF (congestive heart failure) ([place]) 01/15/2021   Acute on chronic respiratory failure with hypoxia and hypercapnia (HCC) 01/15/2021   Acute renal failure superimposed on stage 3 chronic kidney disease (HCC) 01/15/2021   Elevated troponin 01/15/2021   Hyponatremia 01/15/2021   CHF exacerbation ([place]) 10/17/2020   Acute on chronic systolic CHF (congestive heart failure) ([place]) 10/16/2020   Acute on chronic combined systolic and diastolic CHF (congestive heart failure) ([place]) 03/15/2018   S/P right and left heart catheterization 11/02/2017   Panic attacks 11/02/2017   Non-STEMI (non-ST elevated myocardial infarction) ([place])    COPD (chronic obstructive pulmonary disease) ([place])    Hypertension    CKD (chronic kidney disease)    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[place])    Presence of implantable cardioverter-defibrillator (ICD)    GERD (gastroesophageal reflux disease)    Diabetes mellitus with diabetic  neuropathy, with long-term current use of insulin ([place])     Willow Ora, PTA, [facility] [address], [place], [place] [address] [phone] 08/03/21, 5:05 PM   Name: Ryatt Carodine MRN: [id number] Date of Birth: 06-Mar-1961

## 2021-08-04 ENCOUNTER — Ambulatory Visit (INDEPENDENT_AMBULATORY_CARE_PROVIDER_SITE_OTHER): Payer: Medicare Other | Admitting: Orthopedic Surgery

## 2021-08-04 DIAGNOSIS — Z89512 Acquired absence of left leg below knee: Secondary | ICD-10-CM

## 2021-08-04 DIAGNOSIS — Z89511 Acquired absence of right leg below knee: Secondary | ICD-10-CM

## 2021-08-05 ENCOUNTER — Ambulatory Visit: Payer: Medicare Other

## 2021-08-06 ENCOUNTER — Encounter: Payer: Self-pay | Admitting: Orthopedic Surgery

## 2021-08-06 NOTE — Progress Notes (Signed)
Office Visit Note   Patient: Richard [name]           Date of Birth: 1960/11/19           MRN: [id number] Visit Date: 08/04/2021              Requested by: Robert Bellow, PA-C [phone] [place],  [place] [id number] PCP: Robert Bellow, PA-C  Chief Complaint  Patient presents with   Right Leg - Follow-up    Hx BKA   Left Leg - Follow-up    Hx BKA      HPI: Patient is a 61 year old gentleman who is seen for follow-up status post most recent right transtibial amputation and status post a previous left transtibial amputation.  Patient is currently in bearing complains of pain from an bearing in the prosthesis with a worn liner.  Assessment & Plan: Visit Diagnoses:  1. S/P BKA (below knee amputation), right ([place])   2. S/P BKA (below knee amputation), left ([place])     Plan: Patient was given a new liner liner we will follow-up in 4 weeks to check his progress.  Follow-Up Instructions: Return in about 4 weeks (around 09/01/2021).   Ortho Exam  Patient is alert, oriented, no adenopathy, well-dressed, normal affect, normal respiratory effort. Examination patient has no end bearing ulcers but does have redness from pressure.  He does have some dermatitis from sweating.  He is on a diuretic and does have swelling.  Imaging: No results found. No images are attached to the encounter.  Labs: Lab Results  Component Value Date   HGBA1C 10.3 (H) 01/17/2021   HGBA1C 10.4 (H) 10/31/2017   LABURIC 8.2 09/22/2019   REPTSTATUS 01/22/2021 FINAL 01/16/2021   CULT  01/16/2021    NO GROWTH 6 DAYS Performed at [facility], [place] [address], [place], [place] [address]    LABORGA STREPTOCOCCUS GROUP G 01/15/2021     Lab Results  Component Value Date   ALBUMIN 1.8 (L) 02/08/2021   ALBUMIN 1.7 (L) 01/28/2021   ALBUMIN 2.0 (L) 01/21/2021   PREALBUMIN 8.5 (L) 01/16/2021    Lab Results  Component Value Date   MG 2.7 (H) 01/16/2021   MG 2.5 (H) 01/15/2021    MG 2.2 10/18/2020   No results found for: [facility]  Lab Results  Component Value Date   PREALBUMIN 8.5 (L) 01/16/2021   CBC EXTENDED Latest Ref Rng & Units 03/24/2021 03/04/2021 03/03/2021  WBC 3.4 - 10.8 x10E3/uL 5.6 15.1(H) 15.1(H)  RBC 4.14 - 5.80 x10E6/uL 4.55 2.92(L) 3.29(L)  HGB 13.0 - 17.7 g/dL 13.1 8.3(L) 9.2(L)  HCT 37.5 - 51.0 % 41.2 27.0(L) 30.2(L)  PLT 150 - 450 x10E3/uL 284 482(H) 470(H)  NEUTROABS 1.7 - 7.7 K/uL - - -  LYMPHSABS 0.7 - 4.0 K/uL - - -     There is no height or weight on file to calculate BMI.  Orders:  No orders of the defined types were placed in this encounter.  No orders of the defined types were placed in this encounter.    Procedures: No procedures performed  Clinical Data: No additional findings.  ROS:  All other systems negative, except as noted in the HPI. Review of Systems  Objective: Vital Signs: There were no vitals taken for this visit.  Specialty Comments:  No specialty comments available.  PMFS History: Patient Active Problem List   Diagnosis Date Noted   Wound dehiscence 03/02/2021   Subacute osteomyelitis, right  ankle and foot (HCC)    Sleep disturbance    Essential hypertension    Diabetic peripheral neuropathy (HCC)    Labile blood glucose    Left below-knee amputee ([place]) 02/07/2021   PAD (peripheral artery disease) (HCC) 02/01/2021   Below-knee amputation of left lower extremity ([place]) 01/27/2021   S/P BKA (below knee amputation), left (HCC)    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HCC)    SOB (shortness of breath)    Open wound    Bacteremia    Chronic ulcer of left ankle (HCC)    Severe sepsis without septic shock (HCC)    COPD exacerbation (HCC) Q000111Q   Chronic systolic CHF (congestive heart failure) ([place]) 01/15/2021   Acute on chronic respiratory failure with hypoxia and hypercapnia (HCC) 01/15/2021   Acute renal failure superimposed on stage 3 chronic kidney disease (HCC) 01/15/2021   Elevated troponin  01/15/2021   Hyponatremia 01/15/2021   CHF exacerbation ([place]) 10/17/2020   Acute on chronic systolic CHF (congestive heart failure) ([place]) 10/16/2020   Acute on chronic combined systolic and diastolic CHF (congestive heart failure) ([place]) 03/15/2018   S/P right and left heart catheterization 11/02/2017   Panic attacks 11/02/2017   Non-STEMI (non-ST elevated myocardial infarction) ([place])    COPD (chronic obstructive pulmonary disease) ([place])    Hypertension    CKD (chronic kidney disease)    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HCC)    Presence of implantable cardioverter-defibrillator (ICD)    GERD (gastroesophageal reflux disease)    Diabetes mellitus with diabetic neuropathy, with long-term current use of insulin ([place])    Past Medical History:  Diagnosis Date   AICD (automatic cardioverter/defibrillator) present 01/11/2017   Anxiety    CHF (congestive heart failure) (HCC)    CKD (chronic kidney disease)    COPD (chronic obstructive pulmonary disease) ([place])    never smoker, industrial exposure   Diabetes mellitus with diabetic neuropathy, with long-term current use of insulin (HCC)    GERD (gastroesophageal reflux disease)    High cholesterol    History of gout    "take RX qd" (11/01/2017)   Hypertension    Nonobstructive atherosclerosis of coronary artery    On home oxygen therapy    "2L prn" (11/01/2017)   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HCC)    LLL, present for years   Systolic HF (heart failure) ([place])     Family History  Problem Relation Age of Onset   Hypertension Mother    Diabetes Mother    Hypertension Father    Diabetes Father    Diabetes Sister    Diabetes Brother     Past Surgical History:  Procedure Laterality Date   ABDOMINAL AORTOGRAM W/LOWER EXTREMITY N/A 02/01/2021   Procedure: ABDOMINAL AORTOGRAM W/LOWER EXTREMITY;  Surgeon: Serafina Mitchell, MD;  Location: [place] CV LAB;  Service: Cardiovascular;  Laterality: N/A;   AMPUTATION Left 01/21/2021   Procedure: AMPUTATION  BELOW KNEE;  Surgeon: Newt Minion, MD;  Location: [place];  Service: Orthopedics;  Laterality: Left;   AMPUTATION Right 02/04/2021   Procedure: RIGHT FOOT 1ST AND 2ND RAY AMPUTATION;  Surgeon: Newt Minion, MD;  Location: [place];  Service: Orthopedics;  Laterality: Right;   AMPUTATION Right 03/02/2021   Procedure: RIGHT BELOW KNEE AMPUTATION;  Surgeon: Newt Minion, MD;  Location: [place];  Service: Orthopedics;  Laterality: Right;   APPLICATION OF WOUND VAC Left 01/21/2021   Procedure: APPLICATION OF WOUND VAC;  Surgeon: Newt Minion, MD;  Location: [facility];  Service: Orthopedics;  Laterality: Left;   APPLICATION OF WOUND VAC  02/04/2021   Procedure: APPLICATION OF WOUND VAC;  Surgeon: Newt Minion, MD;  Location: [place];  Service: Orthopedics;;   APPLICATION OF WOUND VAC  03/02/2021   Procedure: APPLICATION OF WOUND VAC;  Surgeon: Newt Minion, MD;  Location: [place];  Service: Orthopedics;;   CATARACT EXTRACTION W/ INTRAOCULAR LENS IMPLANTW/ TRABECULECTOMY Left ~ 2013   "may have done this when I had my other eye OR"   [place] N/A 11/02/2017   Procedure: CORONARY STENT INTERVENTION;  Surgeon: Larey Dresser, MD;  Location: [place] [facility] LAB;  Service: Cardiovascular;  Laterality: N/A;   CORONARY STENT INTERVENTION N/A 11/02/2017   Procedure: CORONARY STENT INTERVENTION;  Surgeon: Belva Crome, MD;  Location: [place] [facility] LAB;  Service: Cardiovascular;  Laterality: N/A;   EYE SURGERY Left ~ 2013   "fell on something in basement; stuck in my eye"   ICD IMPLANT     Medtronic Dual Chamber 01/11/17   MULTIPLE TOOTH EXTRACTIONS     "pulled all my top teeth"   PERIPHERAL VASCULAR BALLOON ANGIOPLASTY  02/01/2021   Procedure: PERIPHERAL VASCULAR BALLOON ANGIOPLASTY;  Surgeon: Serafina Mitchell, MD;  Location: [place] CV LAB;  Service: Cardiovascular;;  [facility] [place] / PT   RIGHT/LEFT HEART CATH AND CORONARY ANGIOGRAPHY N/A 11/02/2017   Procedure: RIGHT/LEFT HEART  CATH AND CORONARY ANGIOGRAPHY;  Surgeon: Larey Dresser, MD;  Location: [place] CV LAB;  Service: Cardiovascular;  Laterality: N/A;   TEE WITHOUT CARDIOVERSION N/A 01/20/2021   Procedure: TRANSESOPHAGEAL ECHOCARDIOGRAM (TEE);  Surgeon: Larey Dresser, MD;  Location: [facility] ENDOSCOPY;  Service: Cardiovascular;  Laterality: N/A;   Social History   Occupational History   Occupation: disability    Comment: stopped working in 2000 d/t occupational exposures  Tobacco Use   Smoking status: Former    Types: Cigarettes   Smokeless tobacco: Never   Tobacco comments:    "smoked when I drank"  Vaping Use   Vaping Use: Never used  Substance and Sexual Activity   Alcohol use: Not Currently    Comment: 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

## 2021-08-10 ENCOUNTER — Ambulatory Visit: Payer: Medicare Other | Admitting: Physical Therapy

## 2021-08-10 ENCOUNTER — Other Ambulatory Visit: Payer: Self-pay

## 2021-08-10 ENCOUNTER — Encounter: Payer: Self-pay | Admitting: Physical Therapy

## 2021-08-10 DIAGNOSIS — R2689 Other abnormalities of gait and mobility: Secondary | ICD-10-CM | POA: Diagnosis not present

## 2021-08-10 DIAGNOSIS — M6281 Muscle weakness (generalized): Secondary | ICD-10-CM

## 2021-08-10 NOTE — Therapy (Signed)
[facility] [address] [place], [place], [address] Phone: [phone]   Fax:  [phone]  Physical Therapy Treatment  Patient Details  Name: Richard [name] MRN: [id number] Date of Birth: 1960-09-12 Referring Provider (PT): Dr. Sharol Given   Encounter Date: 08/10/2021   PT End of Session - 08/10/21 1627     Visit Number 6    Number of Visits 17    Date for PT Re-Evaluation 08/26/21    Authorization Type MC/MCD    Progress Note Due on Visit 10    PT Start Time 1541   pt late for session   PT Stop Time 1615    PT Time Calculation (min) 34 min    Equipment Utilized During Treatment --    Activity Tolerance Patient tolerated treatment well    Behavior During Therapy [facility] for tasks assessed/performed             Past Medical History:  Diagnosis Date   AICD (automatic cardioverter/defibrillator) present 01/11/2017   Anxiety    CHF (congestive heart failure) (HCC)    CKD (chronic kidney disease)    COPD (chronic obstructive pulmonary disease) ([place])    never smoker, industrial exposure   Diabetes mellitus with diabetic neuropathy, with long-term current use of insulin (HCC)    GERD (gastroesophageal reflux disease)    High cholesterol    History of gout    "take RX qd" (11/01/2017)   Hypertension    Nonobstructive atherosclerosis of coronary artery    On home oxygen therapy    "2L prn" (11/01/2017)   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[place])    LLL, present for years   Systolic HF (heart failure) ([place])     Past Surgical History:  Procedure Laterality Date   ABDOMINAL AORTOGRAM W/LOWER EXTREMITY N/A 02/01/2021   Procedure: ABDOMINAL AORTOGRAM W/LOWER EXTREMITY;  Surgeon: Serafina Mitchell, MD;  Location: [facility];  Service: Cardiovascular;  Laterality: N/A;   AMPUTATION Left 01/21/2021   Procedure: AMPUTATION BELOW KNEE;  Surgeon: Newt Minion, MD;  Location: [place];  Service: Orthopedics;  Laterality: Left;    AMPUTATION Right 02/04/2021   Procedure: RIGHT FOOT 1ST AND 2ND RAY AMPUTATION;  Surgeon: Newt Minion, MD;  Location: [place];  Service: Orthopedics;  Laterality: Right;   AMPUTATION Right 03/02/2021   Procedure: RIGHT BELOW KNEE AMPUTATION;  Surgeon: Newt Minion, MD;  Location: [place];  Service: Orthopedics;  Laterality: Right;   APPLICATION OF WOUND VAC Left 01/21/2021   Procedure: APPLICATION OF WOUND VAC;  Surgeon: Newt Minion, MD;  Location: [facility];  Service: Orthopedics;  Laterality: Left;   APPLICATION OF WOUND VAC  02/04/2021   Procedure: APPLICATION OF WOUND VAC;  Surgeon: Newt Minion, MD;  Location: [place] [facility];  Service: Orthopedics;;   APPLICATION OF WOUND VAC  03/02/2021   Procedure: APPLICATION OF WOUND VAC;  Surgeon: Newt Minion, MD;  Location: [facility];  Service: Orthopedics;;   CATARACT EXTRACTION W/ INTRAOCULAR LENS IMPLANTW/ TRABECULECTOMY Left ~ 2013   "may have done this when I had my other eye OR"   [place] N/A 11/02/2017   Procedure: CORONARY STENT INTERVENTION;  Surgeon: Larey Dresser, MD;  Location: [facility];  Service: Cardiovascular;  Laterality: N/A;   CORONARY STENT INTERVENTION N/A 11/02/2017   Procedure: CORONARY STENT INTERVENTION;  Surgeon: Belva Crome, MD;  Location: [facility];  Service: Cardiovascular;  Laterality: N/A;   EYE SURGERY Left ~ 2013   "fell on something in basement; stuck in my eye"   ICD IMPLANT     Medtronic Dual Chamber 01/11/17   MULTIPLE TOOTH EXTRACTIONS     "pulled all my top teeth"   PERIPHERAL VASCULAR BALLOON ANGIOPLASTY  02/01/2021   Procedure: PERIPHERAL VASCULAR BALLOON ANGIOPLASTY;  Surgeon: Serafina Mitchell, MD;  Location: [facility];  Service: Cardiovascular;;  [facility] [place] / PT   RIGHT/LEFT HEART CATH AND CORONARY ANGIOGRAPHY N/A 11/02/2017   Procedure: RIGHT/LEFT HEART CATH AND CORONARY ANGIOGRAPHY;  Surgeon: Larey Dresser, MD;  Location: [facility];  Service:  Cardiovascular;  Laterality: N/A;   TEE WITHOUT CARDIOVERSION N/A 01/20/2021   Procedure: TRANSESOPHAGEAL ECHOCARDIOGRAM (TEE);  Surgeon: Larey Dresser, MD;  Location: [facility];  Service: Cardiovascular;  Laterality: N/A;    There were no vitals filed for this visit.   Subjective Assessment - 08/10/21 1544     Subjective No new complaints. No falls. Saw Dr. Sharol Given who gave him a new Vive wear sock to wear under liner due to "getting wounds". No pain at this time.    Patient is accompained by: Family member   spouse   Pertinent History Bil BKA    Limitations Standing;Lifting;Walking;House hold activities    How long can you sit comfortably? no issues    How long can you stand comfortably? 5 min    How long can you walk comfortably? 10 meters    Patient Stated Goals Walk better                [facility] PT Assessment - 08/10/21 1549       Timed Up and Go Test   TUG Normal TUG    Normal TUG (seconds) 19.56   with RW/prostheses with min guard assist and cues for safety.                   [facility] Adult PT Treatment/Exercise - 08/10/21 1549       Transfers   Transfers Sit to Stand;Stand to Sit    Sit to Stand 4: Min guard;With upper extremity assist;From bed;From chair/3-in-1    Stand to Sit 4: Min guard;Without upper extremity assist;To bed;To chair/3-in-1      Ambulation/Gait   Ambulation/Gait Yes    Ambulation/Gait Assistance 4: Min guard    Ambulation/Gait Assistance Details SaO2 87%, 3/4 dyspnea on RA after walking in from parking lot to gym. Recovered with seated rest break. donned 2 lpm via [place] for remainder of session. SaO2 93% after second gait rep with no shortness of breath noted. cues needed on posture and walker position with gait.    Ambulation Distance (Feet) 80 Feet   x1   Assistive device Rolling walker;Prostheses    Gait Pattern Step-through pattern;Decreased stride length;Narrow base of support    Ambulation Surface Level;Indoor      Prosthetics    Prosthetic Care Comments  pt needing assist/cues for correct way to align socket to get limb seated/pin matched up. after practice in session pt able to return demo.    Current prosthetic wear tolerance (days/week)  daily    Current prosthetic wear tolerance (#hours/day)  4 hours total with breaks throught out the day. Pt to increased to 5 hours a day.    Residual limb condition  open wound on medial aspect of incision line of right limb. has vive wear on with tape on skin across wound. removed  tape and educated on no need to put anything over wound when using Vive wear sock.    Education Provided Residual limb care;Correct ply sock adjustment;Proper Donning;Proper wear schedule/adjustment;Proper weight-bearing schedule/adjustment    Person(s) Educated Patient;Spouse    Education Method Explanation;Demonstration;Verbal cues    Education Method Verbalized understanding;Returned demonstration;Verbal cues required    Donning Prosthesis Supervision;Minimal assist    Doffing Prosthesis Supervision                       PT Short Term Goals - 08/10/21 1628       PT SHORT TERM GOAL #1   Title Patient will be able to ambulate 230' with RW and CGA with SpO2 stats >90% with 2 L of external O2 to improve walking endurance    Baseline ); 230' x 2 with RW with CGA to min A with 3 L of O2 with 92% spO2 (07/29/21)    Status Achieved      PT SHORT TERM GOAL #2   Title Pt will be able to perform sit to stand from bariatric chair with one UE support    Baseline bil UE support required 07/29/21    Status Not Met      PT SHORT TERM GOAL #3   Title Patient will verbalize proper skin check, prosthetic limb care and sock management to improve comfort and safety with use of prosthetic limb with WB activities.    Baseline 08/10/21:pt continues to need guidance and cues on management    Status Not Met    Target Date 07/29/21      PT SHORT TERM GOAL #4   Title Pt will decrease TUG from 45 sec to <30 sec  for improved balance and functional mobility.    Baseline 08/10/21: 19.56 seconds with RW/prostheses with min guard assist/cues for safety    Status Achieved               PT Long Term Goals - 07/08/21 1338       PT LONG TERM GOAL #1   Title Pt will be able to ambulate 500' with LRAD (with or without external O2) while maintaining SpO2 >90% on level ground with SBA to improve community ambulation    Baseline 10 meters with RW CGA (07/01/21)    Time 8    Period Weeks    Status New    Target Date 08/26/21      PT LONG TERM GOAL #2   Title Pt will be able to go up and down 12 steps with one rail to be able to access second floor at home    Baseline Pt lives on main level (07/01/21)    Time 8    Period Weeks    Status New    Target Date 08/26/21      PT LONG TERM GOAL #3   Title Pt will be able to ambulate 200' on grass to be able to [employer] terrain.    Baseline Not attempted (07/01/21)    Time 8    Period Weeks    Status New    Target Date 08/26/21      PT LONG TERM GOAL #4   Title Pt will increase Berg to >35/56 for improved balance and decreased fall risk.    Baseline 07/08/21 18/56    Time 8    Period Weeks    Status New    Target Date 08/26/21      PT LONG TERM  GOAL #5   Title Pt will be able to tolerate wearing prostheses 8 hours/day or more with no skin issues for improved function.    Time 8    Period Weeks    Status New    Target Date 08/26/21                   Plan - 08/10/21 1627     Clinical Impression Statement Today's skilled session focused on STGs not addressed with goal 3 not met and goal 4 met. Utilized oxygen today due to SaO2 decreasing with activity. Continued to address limb/prosthetic management today, as well as wound care due to new wound. The pt should benefit from continued PT to progress toward unmet goals.    Personal Factors and Comorbidities Comorbidity 2    Comorbidities Lung disease, bil BKA    Examination-Activity  Limitations Bend;Carry;Hygiene/Grooming;Lift;Squat;Stairs;Stand;Toileting;Transfers    Examination-Participation Restrictions Cleaning;Community Activity;Driving;Laundry;Meal Prep;Shop;Yard Work    Merchant navy officer Stable/Uncomplicated    Rehab Potential Good    PT Frequency 2x / week    PT Duration 8 weeks    PT Treatment/Interventions ADLs/Self Care Home Management;Cryotherapy;Moist Heat;Balance training;Therapeutic exercise;Therapeutic activities;Functional mobility training;Stair training;Gait training;Neuromuscular re-education;Patient/family Brewing technologist;Wheelchair mobility training;Manual techniques;Energy conservation;Passive range of motion;Scar mobilization;Joint Manipulations    PT Next Visit Plan Continue to work on improving gait endurance with walker, monitor vitals during gait, continue to use 3 L of O2 during activity as needed. Practice stairs, work on sit to stand from elevated surfaces without 1 UE to no UE support    Consulted and Agree with Plan of Care Patient;Family member/caregiver             Patient will benefit from skilled therapeutic intervention in order to improve the following deficits and impairments:  Abnormal gait, Cardiopulmonary status limiting activity, Decreased activity tolerance, Decreased balance, Decreased endurance, Decreased knowledge of precautions, Decreased mobility, Decreased range of motion, Difficulty walking, Decreased strength, Decreased scar mobility, Decreased skin integrity, Increased edema, Increased fascial restricitons, Impaired flexibility, Postural dysfunction, Improper body mechanics, Prosthetic Dependency, Pain, Decreased coordination  Visit Diagnosis: Other abnormalities of gait and mobility  Muscle weakness (generalized)     Problem List Patient Active Problem List   Diagnosis Date Noted   Wound dehiscence 03/02/2021   Subacute osteomyelitis, right ankle and foot (HCC)    Sleep  disturbance    Essential hypertension    Diabetic peripheral neuropathy (HCC)    Labile blood glucose    Left below-knee amputee ([place]) 02/07/2021   PAD (peripheral artery disease) ([place]) 02/01/2021   Below-knee amputation of left lower extremity ([place]) 01/27/2021   S/P BKA (below knee amputation), left (HCC)    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HCC)    SOB (shortness of breath)    Open wound    Bacteremia    Chronic ulcer of left ankle (HCC)    Severe sepsis without septic shock (HCC)    COPD exacerbation (HCC) 32/95/1884   Chronic systolic CHF (congestive heart failure) ([place]) 01/15/2021   Acute on chronic respiratory failure with hypoxia and hypercapnia ([place]) 01/15/2021   Acute renal failure superimposed on stage 3 chronic kidney disease ([place]) 01/15/2021   Elevated troponin 01/15/2021   Hyponatremia 01/15/2021   CHF exacerbation ([place]) 10/17/2020   Acute on chronic systolic CHF (congestive heart failure) ([place]) 10/16/2020   Acute on chronic combined systolic and diastolic CHF (congestive heart failure) ([place]) 03/15/2018   S/P right and left heart catheterization 11/02/2017   Panic attacks  11/02/2017   Non-STEMI (non-ST elevated myocardial infarction) (HCC)    COPD (chronic obstructive pulmonary disease) (HCC)    Hypertension    CKD (chronic kidney disease)    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[place])    Presence of implantable cardioverter-defibrillator (ICD)    GERD (gastroesophageal reflux disease)    Diabetes mellitus with diabetic neuropathy, with long-term current use of insulin ([place])     Willow Ora, PTA, [facility] [address] [place], [place] [address] [phone] 08/10/21, 4:33 PM   Name: Richard [name] MRN: [id number] Date of Birth: [birth year]

## 2021-08-12 ENCOUNTER — Ambulatory Visit: Payer: Medicare Other

## 2021-08-17 ENCOUNTER — Other Ambulatory Visit: Payer: Self-pay

## 2021-08-17 ENCOUNTER — Ambulatory Visit: Payer: Medicare Other | Admitting: Physical Therapy

## 2021-08-17 ENCOUNTER — Other Ambulatory Visit (HOSPITAL_COMMUNITY): Payer: Self-pay | Admitting: Cardiology

## 2021-08-17 ENCOUNTER — Encounter: Payer: Self-pay | Admitting: Physical Therapy

## 2021-08-17 DIAGNOSIS — R2689 Other abnormalities of gait and mobility: Secondary | ICD-10-CM | POA: Diagnosis not present

## 2021-08-17 DIAGNOSIS — M6281 Muscle weakness (generalized): Secondary | ICD-10-CM

## 2021-08-18 NOTE — Therapy (Signed)
[facility] [address] [place], [place], [address] Phone: [phone]   Fax:  [phone]  Physical Therapy Treatment  Patient Details  Name: Richard [name] MRN: [id number] Date of Birth: 01-15-1961 Referring Provider (PT): Dr. Sharol Given   Encounter Date: 08/17/2021   PT End of Session - 08/17/21 1538     Visit Number 7    Number of Visits 17    Date for PT Re-Evaluation 08/26/21    Authorization Type MC/MCD    Progress Note Due on Visit 10    PT Start Time 1534    PT Stop Time 1613    PT Time Calculation (min) 39 min    Equipment Utilized During Treatment Gait belt    Activity Tolerance Patient tolerated treatment well    Behavior During Therapy [facility] for tasks assessed/performed             Past Medical History:  Diagnosis Date   AICD (automatic cardioverter/defibrillator) present 01/11/2017   Anxiety    CHF (congestive heart failure) (HCC)    CKD (chronic kidney disease)    COPD (chronic obstructive pulmonary disease) ([place])    never smoker, industrial exposure   Diabetes mellitus with diabetic neuropathy, with long-term current use of insulin (HCC)    GERD (gastroesophageal reflux disease)    High cholesterol    History of gout    "take RX qd" (11/01/2017)   Hypertension    Nonobstructive atherosclerosis of coronary artery    On home oxygen therapy    "2L prn" (11/01/2017)   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[place])    LLL, present for years   Systolic HF (heart failure) ([place])     Past Surgical History:  Procedure Laterality Date   ABDOMINAL AORTOGRAM W/LOWER EXTREMITY N/A 02/01/2021   Procedure: ABDOMINAL AORTOGRAM W/LOWER EXTREMITY;  Surgeon: Serafina Mitchell, MD;  Location: [facility];  Service: Cardiovascular;  Laterality: N/A;   AMPUTATION Left 01/21/2021   Procedure: AMPUTATION BELOW KNEE;  Surgeon: Newt Minion, MD;  Location: [facility];  Service: Orthopedics;  Laterality: Left;   AMPUTATION Right  02/04/2021   Procedure: RIGHT FOOT 1ST AND 2ND RAY AMPUTATION;  Surgeon: Newt Minion, MD;  Location: [facility];  Service: Orthopedics;  Laterality: Right;   AMPUTATION Right 03/02/2021   Procedure: RIGHT BELOW KNEE AMPUTATION;  Surgeon: Newt Minion, MD;  Location: [facility];  Service: Orthopedics;  Laterality: Right;   APPLICATION OF WOUND VAC Left 01/21/2021   Procedure: APPLICATION OF WOUND VAC;  Surgeon: Newt Minion, MD;  Location: [facility];  Service: Orthopedics;  Laterality: Left;   APPLICATION OF WOUND VAC  02/04/2021   Procedure: APPLICATION OF WOUND VAC;  Surgeon: Newt Minion, MD;  Location: [facility];  Service: Orthopedics;;   APPLICATION OF WOUND VAC  03/02/2021   Procedure: APPLICATION OF WOUND VAC;  Surgeon: Newt Minion, MD;  Location: [facility];  Service: Orthopedics;;   CATARACT EXTRACTION W/ INTRAOCULAR LENS IMPLANTW/ TRABECULECTOMY Left ~ 2013   "may have done this when I had my other eye OR"   [place] N/A 11/02/2017   Procedure: CORONARY STENT INTERVENTION;  Surgeon: Larey Dresser, MD;  Location: [facility];  Service: Cardiovascular;  Laterality: N/A;   CORONARY STENT INTERVENTION N/A 11/02/2017   Procedure: CORONARY STENT INTERVENTION;  Surgeon: Belva Crome, MD;  Location: [facility];  Service: Cardiovascular;  Laterality: N/A;  EYE SURGERY Left ~ 2013   "fell on something in basement; stuck in my eye"   ICD IMPLANT     Medtronic Dual Chamber 01/11/17   MULTIPLE TOOTH EXTRACTIONS     "pulled all my top teeth"   PERIPHERAL VASCULAR BALLOON ANGIOPLASTY  02/01/2021   Procedure: PERIPHERAL VASCULAR BALLOON ANGIOPLASTY;  Surgeon: Serafina Mitchell, MD;  Location: [facility];  Service: Cardiovascular;;  [facility] [place] / PT   RIGHT/LEFT HEART CATH AND CORONARY ANGIOGRAPHY N/A 11/02/2017   Procedure: RIGHT/LEFT HEART CATH AND CORONARY ANGIOGRAPHY;  Surgeon: Larey Dresser, MD;  Location: [facility];  Service: Cardiovascular;   Laterality: N/A;   TEE WITHOUT CARDIOVERSION N/A 01/20/2021   Procedure: TRANSESOPHAGEAL ECHOCARDIOGRAM (TEE);  Surgeon: Larey Dresser, MD;  Location: [facility];  Service: Cardiovascular;  Laterality: N/A;    There were no vitals filed for this visit.   Subjective Assessment - 08/17/21 1537     Subjective No new complaints. Did not realize his appt was earlier on Fridays and came at 3pm last Friday (appt was at 2pm). Denies any falls or pain.    Patient is accompained by: Family member   spouse   Pertinent History Bil BKA    Limitations Standing;Lifting;Walking;House hold activities    How long can you sit comfortably? no issues    How long can you stand comfortably? 5 min    How long can you walk comfortably? 10 meters    Patient Stated Goals Walk better    Currently in Pain? No/denies    Pain Score 0-No pain                   [facility] Adult PT Treatment/Exercise - 08/17/21 1541       Transfers   Transfers Sit to Stand;Stand to Sit    Sit to Stand 4: Min guard;With upper extremity assist;From bed;From chair/3-in-1    Stand to Sit 4: Min guard;Without upper extremity assist;To bed;To chair/3-in-1      Ambulation/Gait   Ambulation/Gait Yes    Ambulation/Gait Assistance 4: Min guard    Ambulation/Gait Assistance Details SaO2 85-86% after gait from lobby to gym on RA. Recovered to >/= 95% within 1 minute of deep breathing. 2cd rep from scifit>bathroom>mat table, SaO2 decreased (see Scifit section), recovered quickly. 3rd rep around gym then toward lobby,    Ambulation Distance (Feet) 100 Feet   x3, plus shortd   Assistive device Rolling walker;Prostheses    Gait Pattern Step-through pattern;Decreased stride length;Narrow base of support    Ambulation Surface Level;Indoor    Stairs Yes    Stairs Assistance 5: Supervision    Stairs Assistance Details (indicate cue type and reason) SaO2 86-87% afterwards with recovery within 1-2 minutes to >/=92%. reminder cues to advance  hands down rails with descent to assist with weight shifting with descent.    Stair Management Technique Two rails;Alternating pattern;Forwards    Number of Stairs 4   x3   Height of Stairs 6      Knee/Hip Exercises: Aerobic   Other Aerobic Scifit UE/LE's level 3.5 for 5 mintues with goal 85-95 steps per minute. focus on slow, deep breathing throughout. SaO2 96-97% after, 1 minute rest, then 5 more minutes at same levels, Need for bathroom urgenly so SaO2 measured after return to mat after bathroom with SaO2 83%, Recovered to 90% within 1 minute, then >/= 95 within another minute      Prosthetics   Current prosthetic wear  tolerance (days/week)  daily    Current prosthetic wear tolerance (#hours/day)  3-4 hours consistently. Yesterday was about 5 hours. Discussed working toward increased wear    Residual limb condition  wound on right LE has healed. still wearing the Vive wear socks under liners.    Education Provided Proper wear schedule/adjustment    Person(s) Educated Patient;Spouse    Education Method Explanation;Demonstration;Verbal cues    Education Method Verbalized understanding;Verbal cues required;Needs further instruction                    PT Short Term Goals - 08/10/21 1628       PT SHORT TERM GOAL #1   Title Patient will be able to ambulate 230' with RW and CGA with SpO2 stats >90% with 2 L of external O2 to improve walking endurance    Baseline ); 230' x 2 with RW with CGA to min A with 3 L of O2 with 92% spO2 (07/29/21)    Status Achieved      PT SHORT TERM GOAL #2   Title Pt will be able to perform sit to stand from bariatric chair with one UE support    Baseline bil UE support required 07/29/21    Status Not Met      PT SHORT TERM GOAL #3   Title Patient will verbalize proper skin check, prosthetic limb care and sock management to improve comfort and safety with use of prosthetic limb with WB activities.    Baseline 08/10/21:pt continues to need guidance and  cues on management    Status Not Met    Target Date 07/29/21      PT SHORT TERM GOAL #4   Title Pt will decrease TUG from 45 sec to <30 sec for improved balance and functional mobility.    Baseline 08/10/21: 19.56 seconds with RW/prostheses with min guard assist/cues for safety    Status Achieved               PT Long Term Goals - 07/08/21 1338       PT LONG TERM GOAL #1   Title Pt will be able to ambulate 500' with LRAD (with or without external O2) while maintaining SpO2 >90% on level ground with SBA to improve community ambulation    Baseline 10 meters with RW CGA (07/01/21)    Time 8    Period Weeks    Status New    Target Date 08/26/21      PT LONG TERM GOAL #2   Title Pt will be able to go up and down 12 steps with one rail to be able to access second floor at home    Baseline Pt lives on main level (07/01/21)    Time 8    Period Weeks    Status New    Target Date 08/26/21      PT LONG TERM GOAL #3   Title Pt will be able to ambulate 200' on grass to be able to [employer] terrain.    Baseline Not attempted (07/01/21)    Time 8    Period Weeks    Status New    Target Date 08/26/21      PT LONG TERM GOAL #4   Title Pt will increase Berg to >35/56 for improved balance and decreased fall risk.    Baseline 07/08/21 18/56    Time 8    Period Weeks    Status New    Target Date 08/26/21  PT LONG TERM GOAL #5   Title Pt will be able to tolerate wearing prostheses 8 hours/day or more with no skin issues for improved function.    Time 8    Period Weeks    Status New    Target Date 08/26/21                   Plan - 08/17/21 1539     Clinical Impression Statement Today's skilled session continued to address activity tolerance, gait and stairs with prosthesis. SaO2 did decrease with activity on RA, recovered quickly with seated rest breaks. The pt continues to make progress and should benefit from continued PT to progress toward unmet goals.    Personal  Factors and Comorbidities Comorbidity 2    Comorbidities Lung disease, bil BKA    Examination-Activity Limitations Bend;Carry;Hygiene/Grooming;Lift;Squat;Stairs;Stand;Toileting;Transfers    Examination-Participation Restrictions Cleaning;Community Activity;Driving;Laundry;Meal Prep;Shop;Yard Work    Merchant navy officer Stable/Uncomplicated    Rehab Potential Good    PT Frequency 2x / week    PT Duration 8 weeks    PT Treatment/Interventions ADLs/Self Care Home Management;Cryotherapy;Moist Heat;Balance training;Therapeutic exercise;Therapeutic activities;Functional mobility training;Stair training;Gait training;Neuromuscular re-education;Patient/family Brewing technologist;Wheelchair mobility training;Manual techniques;Energy conservation;Passive range of motion;Scar mobilization;Joint Manipulations    PT Next Visit Plan Continue to work on improving gait endurance with walker, monitor vitals during gait, continue to use 3 L of O2 during activity as needed. continue to work on balance with decreased UE support    Consulted and Agree with Plan of Care Patient;Family member/caregiver             Patient will benefit from skilled therapeutic intervention in order to improve the following deficits and impairments:  Abnormal gait, Cardiopulmonary status limiting activity, Decreased activity tolerance, Decreased balance, Decreased endurance, Decreased knowledge of precautions, Decreased mobility, Decreased range of motion, Difficulty walking, Decreased strength, Decreased scar mobility, Decreased skin integrity, Increased edema, Increased fascial restricitons, Impaired flexibility, Postural dysfunction, Improper body mechanics, Prosthetic Dependency, Pain, Decreased coordination  Visit Diagnosis: Other abnormalities of gait and mobility  Muscle weakness (generalized)     Problem List Patient Active Problem List   Diagnosis Date Noted   Wound dehiscence 03/02/2021    Subacute osteomyelitis, right ankle and foot (HCC)    Sleep disturbance    Essential hypertension    Diabetic peripheral neuropathy (HCC)    Labile blood glucose    Left below-knee amputee ([place]) 02/07/2021   PAD (peripheral artery disease) ([place]) 02/01/2021   Below-knee amputation of left lower extremity ([place]) 01/27/2021   S/P BKA (below knee amputation), left (HCC)    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HCC)    SOB (shortness of breath)    Open wound    Bacteremia    Chronic ulcer of left ankle (HCC)    Severe sepsis without septic shock (HCC)    COPD exacerbation (HCC) 19/50/9326   Chronic systolic CHF (congestive heart failure) ([place]) 01/15/2021   Acute on chronic respiratory failure with hypoxia and hypercapnia ([place]) 01/15/2021   Acute renal failure superimposed on stage 3 chronic kidney disease ([place]) 01/15/2021   Elevated troponin 01/15/2021   Hyponatremia 01/15/2021   CHF exacerbation ([place]) 10/17/2020   Acute on chronic systolic CHF (congestive heart failure) ([place]) 10/16/2020   Acute on chronic combined systolic and diastolic CHF (congestive heart failure) ([place]) 03/15/2018   S/P right and left heart catheterization 11/02/2017   Panic attacks 11/02/2017   Non-STEMI (non-ST elevated myocardial infarction) ([place])    COPD (chronic obstructive  pulmonary disease) ([place])    Hypertension    CKD (chronic kidney disease)    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[place])    Presence of implantable cardioverter-defibrillator (ICD)    GERD (gastroesophageal reflux disease)    Diabetes mellitus with diabetic neuropathy, with long-term current use of insulin ([place])     Willow Ora, PTA, [facility] [address] [place], [place] [address] [phone] 08/18/21, 3:11 PM   Name: Richard [name] MRN: [id number] Date of Birth: 1961-01-19

## 2021-08-19 ENCOUNTER — Other Ambulatory Visit: Payer: Self-pay

## 2021-08-19 ENCOUNTER — Ambulatory Visit: Payer: Medicare Other

## 2021-08-19 DIAGNOSIS — R2689 Other abnormalities of gait and mobility: Secondary | ICD-10-CM | POA: Diagnosis not present

## 2021-08-19 DIAGNOSIS — Z89511 Acquired absence of right leg below knee: Secondary | ICD-10-CM

## 2021-08-19 DIAGNOSIS — S88112A Complete traumatic amputation at level between knee and ankle, left lower leg, initial encounter: Secondary | ICD-10-CM

## 2021-08-19 DIAGNOSIS — M6281 Muscle weakness (generalized): Secondary | ICD-10-CM

## 2021-08-19 DIAGNOSIS — Z89512 Acquired absence of left leg below knee: Secondary | ICD-10-CM

## 2021-08-19 NOTE — Therapy (Signed)
[facility] [address] [place], [place], [address] Phone: [phone]   Fax:  [phone]  Physical Therapy Treatment  Patient Details  Name: Richard [name] MRN: [id number] Date of Birth: 10-29-1960 Referring Provider (PT): Dr. Sharol Given   Encounter Date: 08/19/2021   PT End of Session - 08/19/21 1412     Visit Number 8    Number of Visits 17    Date for PT Re-Evaluation 08/26/21    Authorization Type MC/MCD    Progress Note Due on Visit 10    PT Start Time 1410    PT Stop Time 1450    PT Time Calculation (min) 40 min    Equipment Utilized During Treatment Gait belt    Activity Tolerance Patient tolerated treatment well    Behavior During Therapy [facility] for tasks assessed/performed             Past Medical History:  Diagnosis Date   AICD (automatic cardioverter/defibrillator) present 01/11/2017   Anxiety    CHF (congestive heart failure) (HCC)    CKD (chronic kidney disease)    COPD (chronic obstructive pulmonary disease) ([place])    never smoker, industrial exposure   Diabetes mellitus with diabetic neuropathy, with long-term current use of insulin (HCC)    GERD (gastroesophageal reflux disease)    High cholesterol    History of gout    "take RX qd" (11/01/2017)   Hypertension    Nonobstructive atherosclerosis of coronary artery    On home oxygen therapy    "2L prn" (11/01/2017)   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[place])    LLL, present for years   Systolic HF (heart failure) ([place])     Past Surgical History:  Procedure Laterality Date   ABDOMINAL AORTOGRAM W/LOWER EXTREMITY N/A 02/01/2021   Procedure: ABDOMINAL AORTOGRAM W/LOWER EXTREMITY;  Surgeon: Serafina Mitchell, MD;  Location: [place];  Service: Cardiovascular;  Laterality: N/A;   AMPUTATION Left 01/21/2021   Procedure: AMPUTATION BELOW KNEE;  Surgeon: Newt Minion, MD;  Location: [facility];  Service: Orthopedics;  Laterality: Left;   AMPUTATION Right  02/04/2021   Procedure: RIGHT FOOT 1ST AND 2ND RAY AMPUTATION;  Surgeon: Newt Minion, MD;  Location: [place];  Service: Orthopedics;  Laterality: Right;   AMPUTATION Right 03/02/2021   Procedure: RIGHT BELOW KNEE AMPUTATION;  Surgeon: Newt Minion, MD;  Location: [facility];  Service: Orthopedics;  Laterality: Right;   APPLICATION OF WOUND VAC Left 01/21/2021   Procedure: APPLICATION OF WOUND VAC;  Surgeon: Newt Minion, MD;  Location: [facility];  Service: Orthopedics;  Laterality: Left;   APPLICATION OF WOUND VAC  02/04/2021   Procedure: APPLICATION OF WOUND VAC;  Surgeon: Newt Minion, MD;  Location: [facility];  Service: Orthopedics;;   APPLICATION OF WOUND VAC  03/02/2021   Procedure: APPLICATION OF WOUND VAC;  Surgeon: Newt Minion, MD;  Location: [facility];  Service: Orthopedics;;   CATARACT EXTRACTION W/ INTRAOCULAR LENS IMPLANTW/ TRABECULECTOMY Left ~ 2013   "may have done this when I had my other eye OR"   [place] N/A 11/02/2017   Procedure: CORONARY STENT INTERVENTION;  Surgeon: Larey Dresser, MD;  Location: [facility];  Service: Cardiovascular;  Laterality: N/A;   CORONARY STENT INTERVENTION N/A 11/02/2017   Procedure: CORONARY STENT INTERVENTION;  Surgeon: Belva Crome, MD;  Location: [facility];  Service: Cardiovascular;  Laterality: N/A;  EYE SURGERY Left ~ 2013   "fell on something in basement; stuck in my eye"   ICD IMPLANT     Medtronic Dual Chamber 01/11/17   MULTIPLE TOOTH EXTRACTIONS     "pulled all my top teeth"   PERIPHERAL VASCULAR BALLOON ANGIOPLASTY  02/01/2021   Procedure: PERIPHERAL VASCULAR BALLOON ANGIOPLASTY;  Surgeon: Serafina Mitchell, MD;  Location: [facility];  Service: Cardiovascular;;  [place] / PT   RIGHT/LEFT HEART CATH AND CORONARY ANGIOGRAPHY N/A 11/02/2017   Procedure: RIGHT/LEFT HEART CATH AND CORONARY ANGIOGRAPHY;  Surgeon: Larey Dresser, MD;  Location: [facility];  Service: Cardiovascular;   Laterality: N/A;   TEE WITHOUT CARDIOVERSION N/A 01/20/2021   Procedure: TRANSESOPHAGEAL ECHOCARDIOGRAM (TEE);  Surgeon: Larey Dresser, MD;  Location: [facility];  Service: Cardiovascular;  Laterality: N/A;    There were no vitals filed for this visit.    Vitals at rest: 105/70, 97% spO2 Gait training: 1 x 115' without supplemental O2, with RW and CGA for safety, SpO2 after walk: 88% Gait training: 1 x 115' with 1 L of O2, SpO2 maintained at 94% with cues for pursed lip breathing. Gait training: min A 1 x 20 feet with quad cane, 1 L of O2, with 180 deg turn x 2, cues for taking small steps when turning and not pivoting. Gait training: min A 1 x 115' with quad cane, 1 L of O2. Pt had 2 instances where he got his legs crossed and needed mod A to regain balance. Cues for slower but more controlled cadence, keeping distance between his feet when he is stepping Ramp+curb: CGA with cues to keep ball of feet at edge of step to allow for it to pivot: 2x Standing shoulder/head turns: 15x R and L Standing bil OH reach with cervical extension: 10x, cues to shift hips anterior to counterbalance                               PT Short Term Goals - 08/10/21 1628       PT SHORT TERM GOAL #1   Title Patient will be able to ambulate 230' with RW and CGA with SpO2 stats >90% with 2 L of external O2 to improve walking endurance    Baseline ); 230' x 2 with RW with CGA to min A with 3 L of O2 with 92% spO2 (07/29/21)    Status Achieved      PT SHORT TERM GOAL #2   Title Pt will be able to perform sit to stand from bariatric chair with one UE support    Baseline bil UE support required 07/29/21    Status Not Met      PT SHORT TERM GOAL #3   Title Patient will verbalize proper skin check, prosthetic limb care and sock management to improve comfort and safety with use of prosthetic limb with WB activities.    Baseline 08/10/21:pt continues to need guidance and cues on management     Status Not Met    Target Date 07/29/21      PT SHORT TERM GOAL #4   Title Pt will decrease TUG from 45 sec to <30 sec for improved balance and functional mobility.    Baseline 08/10/21: 19.56 seconds with RW/prostheses with min guard assist/cues for safety    Status Achieved               PT Long  Term Goals - 08/19/21 1413       PT LONG TERM GOAL #1   Title Pt will be able to ambulate 500' with LRAD (with or without external O2) while maintaining SpO2 >90% on level ground with SBA to improve community ambulation    Baseline 10 meters with RW CGA (07/01/21); 115' with min A with quad cane (08/19/21)    Time 8    Period Weeks    Status On-going    Target Date 08/26/21      PT LONG TERM GOAL #2   Title Pt will be able to go up and down 12 steps with one rail to be able to access second floor at home    Baseline Pt lives on main level (07/01/21)    Time 8    Period Weeks    Status New    Target Date 08/26/21      PT LONG TERM GOAL #3   Title Pt will be able to ambulate 200' on grass to be able to [employer] terrain.    Baseline Not attempted (07/01/21); pt reports he has walked on grass at home with walker for few feet    Time 8    Period Weeks    Status On-going    Target Date 08/26/21      PT LONG TERM GOAL #4   Title Pt will increase Berg to >35/56 for improved balance and decreased fall risk.    Baseline 07/08/21 18/56    Time 8    Period Weeks    Status New    Target Date 08/26/21      PT LONG TERM GOAL #5   Title Pt will be able to tolerate wearing prostheses 8 hours/day or more with no skin issues for improved function.    Baseline 4-5 hours (08/19/21)    Time 8    Period Weeks    Status On-going    Target Date 08/26/21                   Plan - 08/19/21 1458     Clinical Impression Statement Pt progressing towards LTG. Pt able to progress to quad cane but requires min A due to intermittent loss of balance. He was able to maintain >90% O2  staturation with 1 L of O2 with activities today. Patient will benefit from skilled PT to address his standing balance, dynamic balance and gait. We    Personal Factors and Comorbidities Comorbidity 2    Comorbidities Lung disease, bil BKA    Examination-Activity Limitations [place];Carry;Hygiene/Grooming;Lift;Squat;Stairs;Stand;Toileting;Transfers    Examination-Participation Restrictions Cleaning;Community Activity;Driving;Laundry;Meal Prep;Shop;Yard Work    Merchant navy officer Stable/Uncomplicated    Rehab Potential Good    PT Frequency 2x / week    PT Duration 8 weeks    PT Treatment/Interventions ADLs/Self Care Home Management;Cryotherapy;Moist Heat;Balance training;Therapeutic exercise;Therapeutic activities;Functional mobility training;Stair training;Gait training;Neuromuscular re-education;Patient/family Brewing technologist;Wheelchair mobility training;Manual techniques;Energy conservation;Passive range of motion;Scar mobilization;Joint Manipulations    PT Next Visit Plan Continue to decrease O2 reliance as tolerated, Try 1 L of O2, measure SpO2 during the activities. Progress to quad cane for level ground walking. Work on standing balance, reaching, weight shifting. Continue to work on improving gait endurance with walker,    Consulted and Agree with Plan of Care Patient;Family member/caregiver             Patient will benefit from skilled therapeutic intervention in order to improve the following deficits and impairments:  Abnormal gait, Cardiopulmonary  status limiting activity, Decreased activity tolerance, Decreased balance, Decreased endurance, Decreased knowledge of precautions, Decreased mobility, Decreased range of motion, Difficulty walking, Decreased strength, Decreased scar mobility, Decreased skin integrity, Increased edema, Increased fascial restricitons, Impaired flexibility, Postural dysfunction, Improper body mechanics, Prosthetic Dependency, Pain,  Decreased coordination  Visit Diagnosis: Other abnormalities of gait and mobility  Muscle weakness (generalized)  Hx of BKA, left (HCC)  Hx of BKA, right (HCC)  Below-knee amputation of left lower extremity ([place])     Problem List Patient Active Problem List   Diagnosis Date Noted   Wound dehiscence 03/02/2021   Subacute osteomyelitis, right ankle and foot (HCC)    Sleep disturbance    Essential hypertension    Diabetic peripheral neuropathy (HCC)    Labile blood glucose    Left below-knee amputee ([place]) 02/07/2021   PAD (peripheral artery disease) ([place]) 02/01/2021   Below-knee amputation of left lower extremity ([place]) 01/27/2021   S/P BKA (below knee amputation), left (HCC)    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HCC)    SOB (shortness of breath)    Open wound    Bacteremia    Chronic ulcer of left ankle (HCC)    Severe sepsis without septic shock (HCC)    COPD exacerbation (HCC) 50/38/8828   Chronic systolic CHF (congestive heart failure) ([place]) 01/15/2021   Acute on chronic respiratory failure with hypoxia and hypercapnia (HCC) 01/15/2021   Acute renal failure superimposed on stage 3 chronic kidney disease (HCC) 01/15/2021   Elevated troponin 01/15/2021   Hyponatremia 01/15/2021   CHF exacerbation (HCC) 10/17/2020   Acute on chronic systolic CHF (congestive heart failure) ([place]) 10/16/2020   Acute on chronic combined systolic and diastolic CHF (congestive heart failure) ([place]) 03/15/2018   S/P right and left heart catheterization 11/02/2017   Panic attacks 11/02/2017   Non-STEMI (non-ST elevated myocardial infarction) (HCC)    COPD (chronic obstructive pulmonary disease) (HCC)    Hypertension    CKD (chronic kidney disease)    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HCC)    Presence of implantable cardioverter-defibrillator (ICD)    GERD (gastroesophageal reflux disease)    Diabetes mellitus with diabetic neuropathy, with long-term current use of insulin ([place])     Kerrie Pleasure, PT 08/19/2021, 3:02 PM  [facility] [address] [place], [place], [address] Phone: [phone]   Fax:  [phone]  Name: Richard [name] MRN: [id number] Date of Birth: 27-Jul-1960

## 2021-08-24 ENCOUNTER — Ambulatory Visit: Payer: Medicare Other | Attending: Physician Assistant | Admitting: Physical Therapy

## 2021-08-24 ENCOUNTER — Other Ambulatory Visit: Payer: Self-pay

## 2021-08-24 ENCOUNTER — Encounter: Payer: Self-pay | Admitting: Physical Therapy

## 2021-08-24 DIAGNOSIS — R2681 Unsteadiness on feet: Secondary | ICD-10-CM | POA: Insufficient documentation

## 2021-08-24 DIAGNOSIS — M6281 Muscle weakness (generalized): Secondary | ICD-10-CM | POA: Diagnosis present

## 2021-08-24 DIAGNOSIS — R293 Abnormal posture: Secondary | ICD-10-CM | POA: Insufficient documentation

## 2021-08-24 DIAGNOSIS — R2689 Other abnormalities of gait and mobility: Secondary | ICD-10-CM | POA: Diagnosis not present

## 2021-08-24 NOTE — Therapy (Signed)
[facility] [address] [place], [place], [address] Phone: [phone]   Fax:  [phone]  Physical Therapy Treatment  Patient Details  Name: Richard [name] MRN: [id number] Date of Birth: 1960-11-10 Referring Provider (PT): Dr. Sharol Given   Encounter Date: 08/24/2021   PT End of Session - 08/24/21 1547     Visit Number 9    Number of Visits 17    Date for PT Re-Evaluation 08/26/21    Authorization Type MC/MCD    Progress Note Due on Visit 10    PT Start Time 1544    PT Stop Time 1616    PT Time Calculation (min) 32 min    Equipment Utilized During Treatment Gait belt    Activity Tolerance Patient tolerated treatment well    Behavior During Therapy [facility] for tasks assessed/performed             Past Medical History:  Diagnosis Date   AICD (automatic cardioverter/defibrillator) present 01/11/2017   Anxiety    CHF (congestive heart failure) (HCC)    CKD (chronic kidney disease)    COPD (chronic obstructive pulmonary disease) ([place])    never smoker, industrial exposure   Diabetes mellitus with diabetic neuropathy, with long-term current use of insulin (HCC)    GERD (gastroesophageal reflux disease)    High cholesterol    History of gout    "take RX qd" (11/01/2017)   Hypertension    Nonobstructive atherosclerosis of coronary artery    On home oxygen therapy    "2L prn" (11/01/2017)   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[place])    LLL, present for years   Systolic HF (heart failure) ([place])     Past Surgical History:  Procedure Laterality Date   ABDOMINAL AORTOGRAM W/LOWER EXTREMITY N/A 02/01/2021   Procedure: ABDOMINAL AORTOGRAM W/LOWER EXTREMITY;  Surgeon: Serafina Mitchell, MD;  Location: [facility];  Service: Cardiovascular;  Laterality: N/A;   AMPUTATION Left 01/21/2021   Procedure: AMPUTATION BELOW KNEE;  Surgeon: Newt Minion, MD;  Location: [place];  Service: Orthopedics;  Laterality: Left;   AMPUTATION Right  02/04/2021   Procedure: RIGHT FOOT 1ST AND 2ND RAY AMPUTATION;  Surgeon: Newt Minion, MD;  Location: [place];  Service: Orthopedics;  Laterality: Right;   AMPUTATION Right 03/02/2021   Procedure: RIGHT BELOW KNEE AMPUTATION;  Surgeon: Newt Minion, MD;  Location: [place];  Service: Orthopedics;  Laterality: Right;   APPLICATION OF WOUND VAC Left 01/21/2021   Procedure: APPLICATION OF WOUND VAC;  Surgeon: Newt Minion, MD;  Location: [place];  Service: Orthopedics;  Laterality: Left;   APPLICATION OF WOUND VAC  02/04/2021   Procedure: APPLICATION OF WOUND VAC;  Surgeon: Newt Minion, MD;  Location: [place];  Service: Orthopedics;;   APPLICATION OF WOUND VAC  03/02/2021   Procedure: APPLICATION OF WOUND VAC;  Surgeon: Newt Minion, MD;  Location: [place];  Service: Orthopedics;;   CATARACT EXTRACTION W/ INTRAOCULAR LENS IMPLANTW/ TRABECULECTOMY Left ~ 2013   "may have done this when I had my other eye OR"   [place] N/A 11/02/2017   Procedure: CORONARY STENT INTERVENTION;  Surgeon: Larey Dresser, MD;  Location: [place];  Service: Cardiovascular;  Laterality: N/A;   CORONARY STENT INTERVENTION N/A 11/02/2017   Procedure: CORONARY STENT INTERVENTION;  Surgeon: Belva Crome, MD;  Location: [place] [facility];  Service: Cardiovascular;  Laterality: N/A;  EYE SURGERY Left ~ 2013   "fell on something in basement; stuck in my eye"   ICD IMPLANT     Medtronic Dual Chamber 01/11/17   MULTIPLE TOOTH EXTRACTIONS     "pulled all my top teeth"   PERIPHERAL VASCULAR BALLOON ANGIOPLASTY  02/01/2021   Procedure: PERIPHERAL VASCULAR BALLOON ANGIOPLASTY;  Surgeon: Serafina Mitchell, MD;  Location: [facility];  Service: Cardiovascular;;  [facility] [place] / PT   RIGHT/LEFT HEART CATH AND CORONARY ANGIOGRAPHY N/A 11/02/2017   Procedure: RIGHT/LEFT HEART CATH AND CORONARY ANGIOGRAPHY;  Surgeon: Larey Dresser, MD;  Location: [place] [facility];  Service: Cardiovascular;   Laterality: N/A;   TEE WITHOUT CARDIOVERSION N/A 01/20/2021   Procedure: TRANSESOPHAGEAL ECHOCARDIOGRAM (TEE);  Surgeon: Larey Dresser, MD;  Location: [facility];  Service: Cardiovascular;  Laterality: N/A;    There were no vitals filed for this visit.   Subjective Assessment - 08/24/21 1546     Subjective No new complaints. No falls or pain to report. Forgot his oxgen today due to rushing to get here.    Patient is accompained by: Family member    Pertinent History Bil BKA    Limitations Standing;Lifting;Walking;House hold activities    How long can you sit comfortably? no issues    How long can you stand comfortably? 5 min    How long can you walk comfortably? 10 meters    Patient Stated Goals Walk better    Currently in Pain? No/denies    Pain Score 0-No pain                     [facility] Adult PT Treatment/Exercise - 08/24/21 1548       Transfers   Transfers Sit to Stand;Stand to Sit    Sit to Stand 4: Min guard;With upper extremity assist;From bed;From chair/3-in-1    Stand to Sit 4: Min guard;Without upper extremity assist;To bed;To chair/3-in-1      Ambulation/Gait   Ambulation/Gait Yes    Ambulation/Gait Assistance 4: Min guard    Ambulation/Gait Assistance Details SaO2 89-90% after walking into the clinic from lobby on RA. Recovered quickly with seated rest breaks.    Ambulation Distance (Feet) 100 Feet   x1   Assistive device Rolling walker;Prostheses    Gait Pattern Step-through pattern;Decreased stride length;Narrow base of support    Ambulation Surface Level;Indoor      High Level Balance   High Level Balance Comments in parallel bars with light support: side stepping left<>right for 4 laps toward each way with min guard assist for safety. SaO2 87%, HR 86 afterwards. Recovered quickly with seated rest break (</= 1 minute). verbally added to HEP.      Knee/Hip Exercises: Aerobic   Other Aerobic Scifit LE's only on level 4.0 x 5 minutes with goal >/= 50  steps per minute for strengthening and activity tolerance, Sao2 96-97%, HR 97. rest for 3 minutes, then repeated above for a second 5 minutes. SaO2 91%, HR 97 after second bout.      Prosthetics   Prosthetic Care Comments  pt has not worked toward increased wear time. Discusssed again today to try and work toward increasing wear time to 5 hours each day    Current prosthetic wear tolerance (days/week)  daily    Current prosthetic wear tolerance (#hours/day)  3-4 hours consistently. Yesterday was about 5 hours. Discussed working toward increased wear    Residual limb condition  intact bil sides per pt  report    Education Provided Proper wear schedule/adjustment;Residual limb care    Person(s) Educated Patient    Education Method Explanation;Demonstration;Verbal cues    Education Method Returned demonstration;Verbal cues required;Needs further instruction    Donning Prosthesis Supervision    Doffing Prosthesis Supervision                       PT Short Term Goals - 08/10/21 1628       PT SHORT TERM GOAL #1   Title Patient will be able to ambulate 230' with RW and CGA with SpO2 stats >90% with 2 L of external O2 to improve walking endurance    Baseline ); 230' x 2 with RW with CGA to min A with 3 L of O2 with 92% spO2 (07/29/21)    Status Achieved      PT SHORT TERM GOAL #2   Title Pt will be able to perform sit to stand from bariatric chair with one UE support    Baseline bil UE support required 07/29/21    Status Not Met      PT SHORT TERM GOAL #3   Title Patient will verbalize proper skin check, prosthetic limb care and sock management to improve comfort and safety with use of prosthetic limb with WB activities.    Baseline 08/10/21:pt continues to need guidance and cues on management    Status Not Met    Target Date 07/29/21      PT SHORT TERM GOAL #4   Title Pt will decrease TUG from 45 sec to <30 sec for improved balance and functional mobility.    Baseline 08/10/21:  19.56 seconds with RW/prostheses with min guard assist/cues for safety    Status Achieved               PT Long Term Goals - 08/19/21 1413       PT LONG TERM GOAL #1   Title Pt will be able to ambulate 500' with LRAD (with or without external O2) while maintaining SpO2 >90% on level ground with SBA to improve community ambulation    Baseline 10 meters with RW CGA (07/01/21); 115' with min A with quad cane (08/19/21)    Time 8    Period Weeks    Status On-going    Target Date 08/26/21      PT LONG TERM GOAL #2   Title Pt will be able to go up and down 12 steps with one rail to be able to access second floor at home    Baseline Pt lives on main level (07/01/21)    Time 8    Period Weeks    Status New    Target Date 08/26/21      PT LONG TERM GOAL #3   Title Pt will be able to ambulate 200' on grass to be able to [employer] terrain.    Baseline Not attempted (07/01/21); pt reports he has walked on grass at home with walker for few feet    Time 8    Period Weeks    Status On-going    Target Date 08/26/21      PT LONG TERM GOAL #4   Title Pt will increase Berg to >35/56 for improved balance and decreased fall risk.    Baseline 07/08/21 18/56    Time 8    Period Weeks    Status New    Target Date 08/26/21      PT LONG  TERM GOAL #5   Title Pt will be able to tolerate wearing prostheses 8 hours/day or more with no skin issues for improved function.    Baseline 4-5 hours (08/19/21)    Time 8    Period Weeks    Status On-going    Target Date 08/26/21                   Plan - 08/24/21 1548     Clinical Impression Statement Today's skilled session continued to focus on strengthening and balance with bil prosthetics with no significant issues noted or reported. Session performed on RA as pt forgot his oxygen tank today with lowest reading 87% with quick recovery to >/=90% with seated rest break. The pt is progressing toward goals.    Personal Factors and Comorbidities  Comorbidity 2    Comorbidities Lung disease, bil BKA    Examination-Activity Limitations Bend;Carry;Hygiene/Grooming;Lift;Squat;Stairs;Stand;Toileting;Transfers    Examination-Participation Restrictions Cleaning;Community Activity;Driving;Laundry;Meal Prep;Shop;Yard Work    Merchant navy officer Stable/Uncomplicated    Rehab Potential Good    PT Frequency 2x / week    PT Duration 8 weeks    PT Treatment/Interventions ADLs/Self Care Home Management;Cryotherapy;Moist Heat;Balance training;Therapeutic exercise;Therapeutic activities;Functional mobility training;Stair training;Gait training;Neuromuscular re-education;Patient/family Brewing technologist;Wheelchair mobility training;Manual techniques;Energy conservation;Passive range of motion;Scar mobilization;Joint Manipulations    PT Next Visit Plan Continue to decrease O2 reliance as tolerated, Try 1 L of O2, measure SpO2 during the activities. Pt has 3 visits left on schedule- begin to address goals to determine recert vs discharge    Consulted and Agree with Plan of Care Patient;Family member/caregiver             Patient will benefit from skilled therapeutic intervention in order to improve the following deficits and impairments:  Abnormal gait, Cardiopulmonary status limiting activity, Decreased activity tolerance, Decreased balance, Decreased endurance, Decreased knowledge of precautions, Decreased mobility, Decreased range of motion, Difficulty walking, Decreased strength, Decreased scar mobility, Decreased skin integrity, Increased edema, Increased fascial restricitons, Impaired flexibility, Postural dysfunction, Improper body mechanics, Prosthetic Dependency, Pain, Decreased coordination  Visit Diagnosis: Other abnormalities of gait and mobility  Muscle weakness (generalized)     Problem List Patient Active Problem List   Diagnosis Date Noted   Wound dehiscence 03/02/2021   Subacute osteomyelitis, right  ankle and foot (HCC)    Sleep disturbance    Essential hypertension    Diabetic peripheral neuropathy (HCC)    Labile blood glucose    Left below-knee amputee ([place]) 02/07/2021   PAD (peripheral artery disease) ([place]) 02/01/2021   Below-knee amputation of left lower extremity ([place]) 01/27/2021   S/P BKA (below knee amputation), left (HCC)    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HCC)    SOB (shortness of breath)    Open wound    Bacteremia    Chronic ulcer of left ankle (HCC)    Severe sepsis without septic shock (HCC)    COPD exacerbation (HCC) 44/96/7591   Chronic systolic CHF (congestive heart failure) ([place]) 01/15/2021   Acute on chronic respiratory failure with hypoxia and hypercapnia ([place]) 01/15/2021   Acute renal failure superimposed on stage 3 chronic kidney disease ([place]) 01/15/2021   Elevated troponin 01/15/2021   Hyponatremia 01/15/2021   CHF exacerbation ([place]) 10/17/2020   Acute on chronic systolic CHF (congestive heart failure) ([place]) 10/16/2020   Acute on chronic combined systolic and diastolic CHF (congestive heart failure) ([place]) 03/15/2018   S/P right and left heart catheterization 11/02/2017   Panic attacks 11/02/2017  Non-STEMI (non-ST elevated myocardial infarction) ([place])    COPD (chronic obstructive pulmonary disease) (HCC)    Hypertension    CKD (chronic kidney disease)    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[place])    Presence of implantable cardioverter-defibrillator (ICD)    GERD (gastroesophageal reflux disease)    Diabetes mellitus with diabetic neuropathy, with long-term current use of insulin ([place])     Willow Ora, PTA, Veteran [address] [place], [place] [address] [phone] 08/24/21, 4:28 PM   Name: Sahan Pen MRN: [id number] Date of Birth: 08-16-1960

## 2021-08-26 ENCOUNTER — Ambulatory Visit: Payer: Medicare Other

## 2021-08-30 ENCOUNTER — Ambulatory Visit (INDEPENDENT_AMBULATORY_CARE_PROVIDER_SITE_OTHER): Payer: Medicare Other

## 2021-08-30 DIAGNOSIS — I428 Other cardiomyopathies: Secondary | ICD-10-CM

## 2021-08-30 LAB — CUP PACEART REMOTE DEVICE CHECK
Battery Remaining Longevity: 63 mo
Battery Voltage: 2.98 V
Brady Statistic AP VP Percent: 0 %
Brady Statistic AP VS Percent: 0.22 %
Brady Statistic AS VP Percent: 0.06 %
Brady Statistic AS VS Percent: 99.71 %
Brady Statistic RA Percent Paced: 0.22 %
Brady Statistic RV Percent Paced: 0.07 %
Date Time Interrogation Session: 20230307033427
HighPow Impedance: 68 Ohm
Implantable Lead Implant Date: 20180719
Implantable Lead Implant Date: 20180719
Implantable Lead Location: 753859
Implantable Lead Location: 753860
Implantable Lead Model: 5076
Implantable Pulse Generator Implant Date: 20180719
Lead Channel Impedance Value: 285 Ohm
Lead Channel Impedance Value: 361 Ohm
Lead Channel Impedance Value: 361 Ohm
Lead Channel Pacing Threshold Amplitude: 0.75 V
Lead Channel Pacing Threshold Amplitude: 1 V
Lead Channel Pacing Threshold Pulse Width: 0.4 ms
Lead Channel Pacing Threshold Pulse Width: 0.4 ms
Lead Channel Sensing Intrinsic Amplitude: 3.875 mV
Lead Channel Sensing Intrinsic Amplitude: 3.875 mV
Lead Channel Sensing Intrinsic Amplitude: 6 mV
Lead Channel Sensing Intrinsic Amplitude: 6 mV
Lead Channel Setting Pacing Amplitude: 2 V
Lead Channel Setting Pacing Amplitude: 2 V
Lead Channel Setting Pacing Pulse Width: 0.4 ms
Lead Channel Setting Sensing Sensitivity: 0.3 mV

## 2021-08-31 ENCOUNTER — Ambulatory Visit: Payer: Medicare Other

## 2021-08-31 ENCOUNTER — Other Ambulatory Visit: Payer: Self-pay

## 2021-08-31 DIAGNOSIS — R2689 Other abnormalities of gait and mobility: Secondary | ICD-10-CM | POA: Diagnosis not present

## 2021-08-31 DIAGNOSIS — M6281 Muscle weakness (generalized): Secondary | ICD-10-CM

## 2021-08-31 DIAGNOSIS — R293 Abnormal posture: Secondary | ICD-10-CM

## 2021-08-31 DIAGNOSIS — R2681 Unsteadiness on feet: Secondary | ICD-10-CM

## 2021-09-01 ENCOUNTER — Ambulatory Visit (INDEPENDENT_AMBULATORY_CARE_PROVIDER_SITE_OTHER): Payer: Medicare Other | Admitting: Orthopedic Surgery

## 2021-09-01 DIAGNOSIS — Z89512 Acquired absence of left leg below knee: Secondary | ICD-10-CM

## 2021-09-01 DIAGNOSIS — Z89511 Acquired absence of right leg below knee: Secondary | ICD-10-CM

## 2021-09-01 NOTE — Therapy (Signed)
OUTPATIENT PHYSICAL THERAPY TREATMENT NOTE/ Recert/Progress note   Patient Name: Richard [name] MRN: [id number] DOB:Jun 26, 1961, 61 y.o., male Today's Date: 09/01/2021  PCP: Robert Bellow, PA-C REFERRING PROVIDER: Newt Minion, MD    Progress Note  Reporting period 07/01/21 to 08/31/21  See Note below for Objective Data and Assessment of Progress/Goals    PT End of Session - 08/31/21 1537     Visit Number 10    Number of Visits 17    Date for PT Re-Evaluation 10/21/21    Authorization Type [facility]    Progress Note Due on Visit 10    PT Start Time 1532    PT Stop Time 1613    PT Time Calculation (min) 41 min    Equipment Utilized During Treatment Gait belt    Activity Tolerance Patient tolerated treatment well    Behavior During Therapy WFL for tasks assessed/performed             Past Medical History:  Diagnosis Date   AICD (automatic cardioverter/defibrillator) present 01/11/2017   Anxiety    CHF (congestive heart failure) (HCC)    CKD (chronic kidney disease)    COPD (chronic obstructive pulmonary disease) ([place])    never smoker, industrial exposure   Diabetes mellitus with diabetic neuropathy, with long-term current use of insulin (HCC)    GERD (gastroesophageal reflux disease)    High cholesterol    History of gout    "take RX qd" (11/01/2017)   Hypertension    Nonobstructive atherosclerosis of coronary artery    On home oxygen therapy    "2L prn" (11/01/2017)   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HCC)    LLL, present for years   Systolic HF (heart failure) ([employer])    Past Surgical History:  Procedure Laterality Date   ABDOMINAL AORTOGRAM W/LOWER EXTREMITY N/A 02/01/2021   Procedure: ABDOMINAL AORTOGRAM W/LOWER EXTREMITY;  Surgeon: Serafina Mitchell, MD;  Location: [place] [facility];  Service: Cardiovascular;  Laterality: N/A;   AMPUTATION Left 01/21/2021   Procedure: AMPUTATION BELOW KNEE;  Surgeon: Newt Minion, MD;  Location: [place];  Service: Orthopedics;  Laterality:  Left;   AMPUTATION Right 02/04/2021   Procedure: RIGHT FOOT 1ST AND 2ND RAY AMPUTATION;  Surgeon: Newt Minion, MD;  Location: [place];  Service: Orthopedics;  Laterality: Right;   AMPUTATION Right 03/02/2021   Procedure: RIGHT BELOW KNEE AMPUTATION;  Surgeon: Newt Minion, MD;  Location: [place];  Service: Orthopedics;  Laterality: Right;   APPLICATION OF WOUND VAC Left 01/21/2021   Procedure: APPLICATION OF WOUND VAC;  Surgeon: Newt Minion, MD;  Location: [place];  Service: Orthopedics;  Laterality: Left;   APPLICATION OF WOUND VAC  02/04/2021   Procedure: APPLICATION OF WOUND VAC;  Surgeon: Newt Minion, MD;  Location: [place];  Service: Orthopedics;;   APPLICATION OF WOUND VAC  03/02/2021   Procedure: APPLICATION OF WOUND VAC;  Surgeon: Newt Minion, MD;  Location: [place];  Service: Orthopedics;;   CATARACT EXTRACTION W/ INTRAOCULAR LENS IMPLANTW/ TRABECULECTOMY Left ~ 2013   "may have done this when I had my other eye OR"   [place] N/A 11/02/2017   Procedure: CORONARY STENT INTERVENTION;  Surgeon: Larey Dresser, MD;  Location: [place] [facility];  Service: Cardiovascular;  Laterality: N/A;   CORONARY STENT INTERVENTION N/A 11/02/2017   Procedure: CORONARY STENT INTERVENTION;  Surgeon: Belva Crome, MD;  Location: [place] [facility];  Service: Cardiovascular;  Laterality: N/A;   EYE SURGERY Left ~ 2013   "fell on something in basement; stuck in my eye"   ICD IMPLANT     Medtronic Dual Chamber 01/11/17   MULTIPLE TOOTH EXTRACTIONS     "pulled all my top teeth"   PERIPHERAL VASCULAR BALLOON ANGIOPLASTY  02/01/2021   Procedure: PERIPHERAL VASCULAR BALLOON ANGIOPLASTY;  Surgeon: Serafina Mitchell, MD;  Location: [place] [facility];  Service: Cardiovascular;;  TP Trunk / PT   RIGHT/LEFT HEART CATH AND CORONARY ANGIOGRAPHY N/A 11/02/2017   Procedure: RIGHT/LEFT HEART CATH AND CORONARY ANGIOGRAPHY;  Surgeon: Larey Dresser, MD;  Location: [place] [facility];   Service: Cardiovascular;  Laterality: N/A;   TEE WITHOUT CARDIOVERSION N/A 01/20/2021   Procedure: TRANSESOPHAGEAL ECHOCARDIOGRAM (TEE);  Surgeon: Larey Dresser, MD;  Location: [facility];  Service: Cardiovascular;  Laterality: N/A;   Patient Active Problem List   Diagnosis Date Noted   Wound dehiscence 03/02/2021   Subacute osteomyelitis, right ankle and foot ([place])    Sleep disturbance    Essential hypertension    Diabetic peripheral neuropathy (HCC)    Labile blood glucose    Left below-knee amputee ([place]) 02/07/2021   PAD (peripheral artery disease) ([place]) 02/01/2021   Below-knee amputation of left lower extremity ([place]) 01/27/2021   S/P BKA (below knee amputation), left (HCC)    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HCC)    SOB (shortness of breath)    Open wound    Bacteremia    Chronic ulcer of left ankle (HCC)    Severe sepsis without septic shock (HCC)    COPD exacerbation (HCC) 86/57/8469   Chronic systolic CHF (congestive heart failure) ([place]) 01/15/2021   Acute on chronic respiratory failure with hypoxia and hypercapnia ([place]) 01/15/2021   Acute renal failure superimposed on stage 3 chronic kidney disease (HCC) 01/15/2021   Elevated troponin 01/15/2021   Hyponatremia 01/15/2021   CHF exacerbation ([place]) 10/17/2020   Acute on chronic systolic CHF (congestive heart failure) ([place]) 10/16/2020   Acute on chronic combined systolic and diastolic CHF (congestive heart failure) ([place]) 03/15/2018   S/P right and left heart catheterization 11/02/2017   Panic attacks 11/02/2017   Non-STEMI (non-ST elevated myocardial infarction) ([place])    COPD (chronic obstructive pulmonary disease) ([place])    Hypertension    CKD (chronic kidney disease)    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HCC)    Presence of implantable cardioverter-defibrillator (ICD)    GERD (gastroesophageal reflux disease)    Diabetes mellitus with diabetic neuropathy, with long-term current use of insulin (HCC)     REFERRING DIAG:  Bilateral BKA  THERAPY DIAG:  Other abnormalities of gait and mobility  Muscle weakness (generalized)  Abnormal posture  Unsteadiness on feet  PERTINENT HISTORY: CHF, COPD, DM, HTN,   PRECAUTIONS: Fall, 2L O2 prn, AICD present  SUBJECTIVE: Pt reports that at a certain time of the day he seems to be more short of breath. Feels like when he gets hungry he does feel more sob. Seems to come and go. Also notices more sob at night. Reports that he sits up when happens. Used his oxygen last night when that happens. Pt usually sleeps propped up on about 5 pillows. Pt reports that when he sits down the back of prostheses is hurting the back of his leg. Advised to talk to prosthetist about that.   PAIN:  Are you having pain? No     TODAY'S TREATMENT:   [facility] Adult  PT Treatment/Exercise - 08/31/21 1542       Transfers   Transfers Sit to Stand;Stand to Sit    Sit to Stand 5: Supervision;With upper extremity assist    Stand to Sit 5: Supervision      Ambulation/Gait   Ambulation/Gait Yes    Ambulation/Gait Assistance 5: Supervision;4: Min guard    Ambulation/Gait Assistance Details O2 sat=90% after, HR=110 with pt reported feeling pretty SOB.    Ambulation Distance (Feet) 460 Feet    Assistive device Rolling walker;Prostheses    Gait Pattern Step-through pattern    Ambulation Surface Level;Indoor      Standardized Balance Assessment   Standardized Balance Assessment Berg Balance Test   O2 sat=91% after Berg. Took seated rest breaks periodically throughout testing.     Berg Balance Test   Sit to Stand Able to stand  independently using hands    Standing Unsupported Able to stand safely 2 minutes    Sitting with Back Unsupported but Feet Supported on Floor or Stool Able to sit safely and securely 2 minutes    Stand to Sit Controls descent by using hands    Transfers Able to transfer safely, definite need of hands    Standing Unsupported with Eyes Closed Able to stand 10 seconds with  supervision    Standing Ubsupported with Feet Together Able to place feet together independently but unable to hold for 30 seconds    From Standing, Reach Forward with Outstretched Arm Can reach forward >12 cm safely (5")    From Standing Position, Pick up Object from Floor Unable to pick up and needs supervision    From Standing Position, Turn to Look Behind Over each Shoulder Turn sideways only but maintains balance    Turn 360 Degrees Needs close supervision or verbal cueing    Standing Unsupported, Alternately Place Feet on Step/Stool Able to complete >2 steps/needs minimal assist   needs to hold walker   Standing Unsupported, One Foot in Front Able to take small step independently and hold 30 seconds    Standing on One Leg Tries to lift leg/unable to hold 3 seconds but remains standing independently    Total Score 33      Prosthetics   Current prosthetic wear tolerance (days/week)  daily    Current prosthetic wear tolerance (#hours/day)  5 hours/day    Residual limb condition  intact per pt report                 PATIENT EDUCATION: Education details: results of testing and recert plan. Prosthetic education Person educated: Patient and Spouse Education method: Explanation Education comprehension: verbalized understanding   HOME EXERCISE PROGRAM:    PT Short Term Goals - 09/01/21 0832       PT SHORT TERM GOAL #1   Title Patient will be able to ambulate 230' with RW and CGA with SpO2 stats >90% with 2 L of external O2 to improve walking endurance    Baseline ); 230' x 2 with RW with CGA to min A with 3 L of O2 with 92% spO2 (07/29/21)    Status Achieved      PT SHORT TERM GOAL #2   Title Pt will be able to perform sit to stand from bariatric chair with one UE support    Baseline bil UE support required 07/29/21    Status Not Met      PT SHORT TERM GOAL #3   Title Patient will verbalize proper skin check, prosthetic limb  care and sock management to improve comfort and  safety with use of prosthetic limb with WB activities.    Baseline 08/10/21:pt continues to need guidance and cues on management    Status Not Met    Target Date 07/29/21      PT SHORT TERM GOAL #4   Title Pt will decrease TUG from 45 sec to <30 sec for improved balance and functional mobility.    Baseline 08/10/21: 19.56 seconds with RW/prostheses with min guard assist/cues for safety    Status Achieved              PT Long Term Goals - 08/31/21 1559       PT LONG TERM GOAL #1   Title Pt will be able to ambulate 500' with LRAD (with or without external O2) while maintaining SpO2 >90% on level ground with SBA to improve community ambulation    Baseline 10 meters with RW CGA (07/01/21); 115' with min A with quad cane (08/19/21), 08/31/21 460' with RW with O2 sat 90% without oxygen at end supervisoin    Time 8    Period Weeks    Status Partially Met    Target Date 08/26/21      PT LONG TERM GOAL #2   Title Pt will be able to go up and down 12 steps with one rail to be able to access second floor at home    Baseline Pt lives on main level (07/01/21). 3/8/223 Has done 8 steps on prior visit with supervision with bilateral rails    Time 8    Period Weeks    Status Not Met    Target Date 08/26/21      PT LONG TERM GOAL #3   Title Pt will be able to ambulate 200' on grass to be able to [employer] terrain.    Baseline Not attempted (07/01/21); pt reports he has walked on grass at home with walker for few feet. 08/31/21 not assessed    Time 8    Period Weeks    Status Deferred    Target Date 08/26/21      PT LONG TERM GOAL #4   Title Pt will increase Berg to >35/56 for improved balance and decreased fall risk.    Baseline 07/08/21 18/56. 08/31/21 33/56    Time 8    Period Weeks    Status Partially Met    Target Date 08/26/21      PT LONG TERM GOAL #5   Title Pt will be able to tolerate wearing prostheses 8 hours/day or more with no skin issues for improved function.    Baseline 4-5  hours (08/19/21). 08/31/21 Pt wearing prostheses 5 hours/day    Time 8    Period Weeks    Status Not Met    Target Date 08/26/21           Updated PT goals: SHORT TERM GOALS:  Pt will be independent with prosthetic management for improved function. Baseline:  Target date: 09/29/2021 Goal status: INITIAL  2.  Pt will be able to tolerate wearing prosthesis 8 hours per day with no skin issues for improved function.  Baseline: 08/31/21 5 hours/day Target date: 09/29/2021 Goal status: INITIAL  3.  Pt will increase Berg from 33/56 to >36/56 for improved balance and decreased fall risk. Baseline: 08/31/21 33/56 Target date: 09/29/2021 Goal status: INITIAL  4.  Pt will be able to demonstrate appropriate breathing techniques during activities to be able to decrease his  SOB. Baseline: needs reminders to not hold breath and for pursed lip breathing Target date: 09/29/2021 Goal status: INITIAL    LONG TERM GOALS:  Pt will be able to perform HEP for strengthening, balance and aerobic activity to continue gains on own. Baseline:  Target date:  10/21/21 Goal status: INITIAL  2.  Pt will increase Berg Balance score from 33 to >39/56 for improved balance and decreased fall risk. Baseline: 08/31/21 33/56 Target date:  10/21/21 Goal status: INITIAL  3.  Pt will ambulate >500' with LRAD mod I for improved short community distances. Baseline: 32' with RW supervision on level Target date:  10/21/21 Goal status: INITIAL  4.  Pt will ambulate up/down 12 steps with rails mod I for improved community access and functional strength. Baseline: 8 steps supervision with bilateral rails Target date:  10/21/21 Goal status: REVISED     Plan - 09/01/21 0835     Clinical Impression Statement PT assessed LTGs today. Pt has shown progress to all LTGs but did not fully meet them. He has been able to increase gait distance with RW with O2 sats staying in 90s more consistently without his oxygen but PT has  encouraged him to wear it with more strenuous activities. Have explained with increase demand on body with walking with bilateral prostheses. Pt also tends to hold breath with activities a lot. Pt is able to walk on level surfaces 460' with supervision. He was able to perform 8 steps with bilateral rails supervision. Has increased his wear time to 5 hours but will want to continue to progress this. Pt increased Berg score from 18 to 33/56 indicating improving balance but still high fall risk. Pt will benefit from continued PT to continue to progress his gait, prosthetic training, balance and improve activity tolerance.    Personal Factors and Comorbidities Comorbidity 2;Comorbidity 3+    Comorbidities COPD, CHF, DM    Examination-Activity Limitations Bend;Carry;Hygiene/Grooming;Lift;Squat;Stairs;Stand;Toileting;Transfers    Examination-Participation Restrictions Cleaning;Community Activity;Driving;Laundry;Meal Prep;Shop;Yard Work    Merchant navy officer Evolving/Moderate complexity    Clinical Decision Making Moderate    Rehab Potential Good    PT Frequency 1x / week    PT Duration 8 weeks    PT Treatment/Interventions ADLs/Self Care Home Management;Cryotherapy;Moist Heat;Balance training;Therapeutic exercise;Therapeutic activities;Functional mobility training;Stair training;Gait training;Neuromuscular re-education;Patient/family Brewing technologist;Wheelchair mobility training;Manual techniques;Energy conservation;Passive range of motion;Vestibular    PT Next Visit Plan Continue to work on balance, establish HEP to perform on counter at home. Prosthetic education to continue to increase wear time. Gait training with monitoring O2 sats. Breathing techniques. Pt has 2L O2 prn.    Consulted and Agree with Plan of Care Patient;Family member/caregiver               Electa Sniff, PT, DPT, NCS 09/01/2021, 10:02 AM

## 2021-09-02 ENCOUNTER — Ambulatory Visit: Payer: Medicare Other

## 2021-09-04 ENCOUNTER — Encounter: Payer: Self-pay | Admitting: Orthopedic Surgery

## 2021-09-04 NOTE — Progress Notes (Signed)
Office Visit Note   Patient: Richard [name]           Date of Birth: 1961-04-14           MRN: [id number] Visit Date: 09/01/2021              Requested by: Robert Bellow, PA-C [place] [id number] [place],  [place] [address] PCP: Robert Bellow, PA-C  Chief Complaint  Patient presents with   Right Leg - Follow-up    Right BKA   Left Leg - Follow-up    Left BKA      HPI: Patient is a 61 year old gentleman who is status post bilateral transtibial amputations.  Patient has significant fluctuations in the fluid in his legs he is currently taking fluid pills.  He is currently going to physical therapy once a week working on balance.  Assessment & Plan: Visit Diagnoses:  1. S/P BKA (below knee amputation), right ([place])   2. S/P BKA (below knee amputation), left ([place])     Plan: Patient will eventually need new sockets.  Recommended using the liner liner to help with the dermatitis and swelling.  Follow-Up Instructions: Return in about 3 months (around 12/02/2021).   Ortho Exam  Patient is alert, oriented, no adenopathy, well-dressed, normal affect, normal respiratory effort. Examination the legs have no significant swelling at this time there are no ulcers no redness no cellulitis no drainage no maceration.  Imaging: No results found. No images are attached to the encounter.  Labs: Lab Results  Component Value Date   HGBA1C 10.3 (H) 01/17/2021   HGBA1C 10.4 (H) 10/31/2017   LABURIC 8.2 09/22/2019   REPTSTATUS 01/22/2021 FINAL 01/16/2021   CULT  01/16/2021    NO GROWTH 6 DAYS Performed at [facility], [place] [address] [place],  [address]    LABORGA STREPTOCOCCUS GROUP G 01/15/2021     Lab Results  Component Value Date   ALBUMIN 1.8 (L) 02/08/2021   ALBUMIN 1.7 (L) 01/28/2021   ALBUMIN 2.0 (L) 01/21/2021   PREALBUMIN 8.5 (L) 01/16/2021    Lab Results  Component Value Date   MG 2.7 (H) 01/16/2021   MG 2.5 (H) 01/15/2021   MG 2.2  10/18/2020   No results found for: [facility]  Lab Results  Component Value Date   PREALBUMIN 8.5 (L) 01/16/2021   CBC EXTENDED Latest Ref Rng & Units 03/24/2021 03/04/2021 03/03/2021  WBC 3.4 - 10.8 x10E3/uL 5.6 15.1(H) 15.1(H)  RBC 4.14 - 5.80 x10E6/uL 4.55 2.92(L) 3.29(L)  HGB 13.0 - 17.7 g/dL 13.1 8.3(L) 9.2(L)  HCT 37.5 - 51.0 % 41.2 27.0(L) 30.2(L)  PLT 150 - 450 x10E3/uL 284 482(H) 470(H)  NEUTROABS 1.7 - 7.7 K/uL - - -  LYMPHSABS 0.7 - 4.0 K/uL - - -     There is no height or weight on file to calculate BMI.  Orders:  No orders of the defined types were placed in this encounter.  No orders of the defined types were placed in this encounter.    Procedures: No procedures performed  Clinical Data: No additional findings.  ROS:  All other systems negative, except as noted in the HPI. Review of Systems  Objective: Vital Signs: There were no vitals taken for this visit.  Specialty Comments:  No specialty comments available.  PMFS History: Patient Active Problem List   Diagnosis Date Noted   Wound dehiscence 03/02/2021   Subacute osteomyelitis, right ankle and foot ([place])    Sleep  disturbance    Essential hypertension    Diabetic peripheral neuropathy (HCC)    Labile blood glucose    Left below-knee amputee (HCC) 02/07/2021   PAD (peripheral artery disease) (HCC) 02/01/2021   Below-knee amputation of left lower extremity (HCC) 01/27/2021   S/P BKA (below knee amputation), left (HCC)    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HCC)    SOB (shortness of breath)    Open wound    Bacteremia    Chronic ulcer of left ankle (HCC)    Severe sepsis without septic shock (HCC)    COPD exacerbation (HCC) 01/15/2021   Chronic systolic CHF (congestive heart failure) (HCC) 01/15/2021   Acute on chronic respiratory failure with hypoxia and hypercapnia (HCC) 01/15/2021   Acute renal failure superimposed on stage 3 chronic kidney disease (HCC) 01/15/2021   Elevated troponin  01/15/2021   Hyponatremia 01/15/2021   CHF exacerbation (HCC) 10/17/2020   Acute on chronic systolic CHF (congestive heart failure) (HCC) 10/16/2020   Acute on chronic combined systolic and diastolic CHF (congestive heart failure) (HCC) 03/15/2018   S/P right and left heart catheterization 11/02/2017   Panic attacks 11/02/2017   Non-STEMI (non-ST elevated myocardial infarction) (HCC)    COPD (chronic obstructive pulmonary disease) (HCC)    Hypertension    CKD (chronic kidney disease)    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HCC)    Presence of implantable cardioverter-defibrillator (ICD)    GERD (gastroesophageal reflux disease)    Diabetes mellitus with diabetic neuropathy, with long-term current use of insulin (HCC)    Past Medical History:  Diagnosis Date   AICD (automatic cardioverter/defibrillator) present 01/11/2017   Anxiety    CHF (congestive heart failure) (HCC)    CKD (chronic kidney disease)    COPD (chronic obstructive pulmonary disease) (HCC)    never smoker, industrial exposure   Diabetes mellitus with diabetic neuropathy, with long-term current use of insulin (HCC)    GERD (gastroesophageal reflux disease)    High cholesterol    History of gout    "take RX qd" (11/01/2017)   Hypertension    Nonobstructive atherosclerosis of coronary artery    On home oxygen therapy    "2L prn" (11/01/2017)   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HCC)    LLL, present for years   Systolic HF (heart failure) (HCC)     Family History  Problem Relation Age of Onset   Hypertension Mother    Diabetes Mother    Hypertension Father    Diabetes Father    Diabetes Sister    Diabetes Brother     Past Surgical History:  Procedure Laterality Date   ABDOMINAL AORTOGRAM W/LOWER EXTREMITY N/A 02/01/2021   Procedure: ABDOMINAL AORTOGRAM W/LOWER EXTREMITY;  Surgeon: Nada Libman, MD;  [facility];  Service: Cardiovascular;  Laterality: N/A;   AMPUTATION Left 01/21/2021   Procedure: AMPUTATION  BELOW KNEE;  Surgeon: Nadara Mustard, MD;  Location: [facility] OR;  Service: Orthopedics;  Laterality: Left;   AMPUTATION Right 02/04/2021   Procedure: RIGHT FOOT 1ST AND 2ND RAY AMPUTATION;  Surgeon: Nadara Mustard, MD;  Location: [name] OR;  Service: Orthopedics;  Laterality: Right;   AMPUTATION Right 03/02/2021   Procedure: RIGHT BELOW KNEE AMPUTATION;  Surgeon: Nadara Mustard, MD;  Location: [facility] OR;  Service: Orthopedics;  Laterality: Right;   APPLICATION OF WOUND VAC Left 01/21/2021   Procedure: APPLICATION OF WOUND VAC;  Surgeon: Nadara Mustard, MD;  [facility];  Service: Orthopedics;  Laterality: Left;   APPLICATION OF WOUND VAC  02/04/2021   Procedure: APPLICATION OF WOUND VAC;  Surgeon: Newt Minion, MD;  Location: [place];  Service: Orthopedics;;   APPLICATION OF WOUND VAC  03/02/2021   Procedure: APPLICATION OF WOUND VAC;  Surgeon: Newt Minion, MD;  Location: [place];  Service: Orthopedics;;   CATARACT EXTRACTION W/ INTRAOCULAR LENS IMPLANTW/ TRABECULECTOMY Left ~ 2013   "may have done this when I had my other eye OR"   [place] N/A 11/02/2017   Procedure: CORONARY STENT INTERVENTION;  Surgeon: Larey Dresser, MD;  Location: [place] [facility];  Service: Cardiovascular;  Laterality: N/A;   CORONARY STENT INTERVENTION N/A 11/02/2017   Procedure: CORONARY STENT INTERVENTION;  Surgeon: Belva Crome, MD;  Location: [place] [facility];  Service: Cardiovascular;  Laterality: N/A;   EYE SURGERY Left ~ 2013   "fell on something in basement; stuck in my eye"   ICD IMPLANT     Medtronic Dual Chamber 01/11/17   MULTIPLE TOOTH EXTRACTIONS     "pulled all my top teeth"   PERIPHERAL VASCULAR BALLOON ANGIOPLASTY  02/01/2021   Procedure: PERIPHERAL VASCULAR BALLOON ANGIOPLASTY;  Surgeon: Serafina Mitchell, MD;  Location: [place] [facility];  Service: Cardiovascular;;  TP Trunk / PT   RIGHT/LEFT HEART CATH AND CORONARY ANGIOGRAPHY N/A 11/02/2017   Procedure: RIGHT/LEFT HEART  CATH AND CORONARY ANGIOGRAPHY;  Surgeon: Larey Dresser, MD;  Location: [place] [facility];  Service: Cardiovascular;  Laterality: N/A;   TEE WITHOUT CARDIOVERSION N/A 01/20/2021   Procedure: TRANSESOPHAGEAL ECHOCARDIOGRAM (TEE);  Surgeon: Larey Dresser, MD;  Location: [facility] ENDOSCOPY;  Service: Cardiovascular;  Laterality: N/A;   Social History   Occupational History   Occupation: disability    Comment: stopped working in 2000 d/t occupational exposures  Tobacco Use   Smoking status: Former    Types: Cigarettes   Smokeless tobacco: Never   Tobacco comments:    "smoked when I drank"  Vaping Use   Vaping Use: Never used  Substance and Sexual Activity   Alcohol use: Not Currently    Comment: 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

## 2021-09-07 ENCOUNTER — Other Ambulatory Visit: Payer: Self-pay

## 2021-09-07 ENCOUNTER — Ambulatory Visit: Payer: Medicare Other

## 2021-09-07 DIAGNOSIS — R2681 Unsteadiness on feet: Secondary | ICD-10-CM

## 2021-09-07 DIAGNOSIS — R2689 Other abnormalities of gait and mobility: Secondary | ICD-10-CM

## 2021-09-07 NOTE — Patient Instructions (Addendum)
Access Code: [email or web address] ?URL: [email or web address] ?Date: 09/07/2021 ?Prepared by: Elmer Bales ? ?Exercises ?Side Stepping with Counter Support - 1 x daily - 5 x weekly - 1 sets - 3-4 reps ?Forward/backwards walk at counter - 1 x daily - 5 x weekly - 1 sets - 3-4 reps ?Wide Stance with moving cones across body at sink- 1 x daily - 5 x weekly - 2 sets - 5 reps ? ?

## 2021-09-07 NOTE — Therapy (Signed)
?OUTPATIENT PHYSICAL THERAPY TREATMENT NOTE ? ? ?Patient Name: Richard [name] ?MRN: [id number] ?DOB:August 08, 1960, 61 y.o., male ?Today's Date: 09/07/2021 ? ?PCP: Robert Bellow, PA-C ?REFERRING PROVIDER: Robert Bellow, PA-C ? ?  ? ? ? PT End of Session - 09/07/21 1626   ? ? Visit Number 11   ? Number of Visits 17   ? Date for PT Re-Evaluation 10/21/21   ? Authorization Type MC/MCD   ? Progress Note Due on Visit 10   ? PT Start Time [id number]   ? PT Stop Time [id number]   ? PT Time Calculation (min) 40 min   ? Equipment Utilized During Treatment Gait belt   ? Activity Tolerance Patient tolerated treatment well   ? Behavior During Therapy [facility] for tasks assessed/performed   ? ?  ?  ? ?  ? ? ?Past Medical History:  ?Diagnosis Date  ? AICD (automatic cardioverter/defibrillator) present 01/11/2017  ? Anxiety   ? CHF (congestive heart failure) ([place])   ? CKD (chronic kidney disease)   ? COPD (chronic obstructive pulmonary disease) ([place])   ? never smoker, industrial exposure  ? Diabetes mellitus with diabetic neuropathy, with long-term current use of insulin ([place])   ? GERD (gastroesophageal reflux disease)   ? High cholesterol   ? History of gout   ? "take RX qd" (11/01/2017)  ? Hypertension   ? Nonobstructive atherosclerosis of coronary artery   ? On home oxygen therapy   ? "2L prn" (11/01/2017)  ? 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[place])   ? LLL, present for years  ? Systolic HF (heart failure) ([place])   ? ?Past Surgical History:  ?Procedure Laterality Date  ? ABDOMINAL AORTOGRAM W/LOWER EXTREMITY N/A 02/01/2021  ? Procedure: ABDOMINAL AORTOGRAM W/LOWER EXTREMITY;  Surgeon: Serafina Mitchell, MD;  Location: [place] [facility];  Service: Cardiovascular;  Laterality: N/A;  ? AMPUTATION Left 01/21/2021  ? Procedure: AMPUTATION BELOW KNEE;  Surgeon: Newt Minion, MD;  Location: [place];  Service: Orthopedics;  Laterality: Left;  ? AMPUTATION Right 02/04/2021  ? Procedure: RIGHT FOOT 1ST AND 2ND RAY AMPUTATION;  Surgeon: Newt Minion, MD;  Location: [place];   Service: Orthopedics;  Laterality: Right;  ? AMPUTATION Right 03/02/2021  ? Procedure: RIGHT BELOW KNEE AMPUTATION;  Surgeon: Newt Minion, MD;  Location: [place];  Service: Orthopedics;  Laterality: Right;  ? APPLICATION OF WOUND VAC Left 01/21/2021  ? Procedure: APPLICATION OF WOUND VAC;  Surgeon: Newt Minion, MD;  Location: [place];  Service: Orthopedics;  Laterality: Left;  ? APPLICATION OF WOUND VAC  02/04/2021  ? Procedure: APPLICATION OF WOUND VAC;  Surgeon: Newt Minion, MD;  Location: [place];  Service: Orthopedics;;  ? APPLICATION OF WOUND VAC  03/02/2021  ? Procedure: APPLICATION OF WOUND VAC;  Surgeon: Newt Minion, MD;  Location: [place];  Service: Orthopedics;;  ? CATARACT EXTRACTION W/ INTRAOCULAR LENS IMPLANTW/ TRABECULECTOMY Left ~ 2013  ? "may have done this when I had my other eye OR"  ? CIRCUMCISION    ? CORONARY STENT INTERVENTION N/A 11/02/2017  ? Procedure: CORONARY STENT INTERVENTION;  Surgeon: Larey Dresser, MD;  Location: [place] [facility];  Service: Cardiovascular;  Laterality: N/A;  ? CORONARY STENT INTERVENTION N/A 11/02/2017  ? Procedure: CORONARY STENT INTERVENTION;  Surgeon: Belva Crome, MD;  Location: [place] [facility];  Service: Cardiovascular;  Laterality: N/A;  ? EYE SURGERY Left ~ 2013  ? "fell on something in basement; stuck  in my eye"  ? ICD IMPLANT    ? Medtronic Dual Chamber 01/11/17  ? MULTIPLE TOOTH EXTRACTIONS    ? "pulled all my top teeth"  ? PERIPHERAL VASCULAR BALLOON ANGIOPLASTY  02/01/2021  ? Procedure: PERIPHERAL VASCULAR BALLOON ANGIOPLASTY;  Surgeon: Serafina Mitchell, MD;  Location: [place] [facility];  Service: Cardiovascular;;  TP Trunk / PT  ? RIGHT/LEFT HEART CATH AND CORONARY ANGIOGRAPHY N/A 11/02/2017  ? Procedure: RIGHT/LEFT HEART CATH AND CORONARY ANGIOGRAPHY;  Surgeon: Larey Dresser, MD;  Location: [place] [facility];  Service: Cardiovascular;  Laterality: N/A;  ? TEE WITHOUT CARDIOVERSION N/A 01/20/2021  ? Procedure: TRANSESOPHAGEAL ECHOCARDIOGRAM (TEE);   Surgeon: Larey Dresser, MD;  Location: [facility];  Service: Cardiovascular;  Laterality: N/A;  ? ?Patient Active Problem List  ? Diagnosis Date Noted  ? Wound dehiscence 03/02/2021  ? Subacute osteomyelitis, right ankle and foot ([place])   ? Sleep disturbance   ? Essential hypertension   ? Diabetic peripheral neuropathy ([place])   ? Labile blood glucose   ? Left below-knee amputee ([place]) 02/07/2021  ? PAD (peripheral artery disease) ([place]) 02/01/2021  ? Below-knee amputation of left lower extremity ([place]) 01/27/2021  ? S/P BKA (below knee amputation), left ([place])   ? 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[place])   ? SOB (shortness of breath)   ? Open wound   ? Bacteremia   ? Chronic ulcer of left ankle ([place])   ? Severe sepsis without septic shock ([place])   ? COPD exacerbation ([place]) 01/15/2021  ? Chronic systolic CHF (congestive heart failure) ([place]) 01/15/2021  ? Acute on chronic respiratory failure with hypoxia and hypercapnia ([place]) 01/15/2021  ? Acute renal failure superimposed on stage 3 chronic kidney disease ([place]) 01/15/2021  ? Elevated troponin 01/15/2021  ? Hyponatremia 01/15/2021  ? CHF exacerbation ([place]) 10/17/2020  ? Acute on chronic systolic CHF (congestive heart failure) ([place]) 10/16/2020  ? Acute on chronic combined systolic and diastolic CHF (congestive heart failure) ([place]) 03/15/2018  ? S/P right and left heart catheterization 11/02/2017  ? Panic attacks 11/02/2017  ? Non-STEMI (non-ST elevated myocardial infarction) ([place])   ? COPD (chronic obstructive pulmonary disease) ([place])   ? Hypertension   ? CKD (chronic kidney disease)   ? 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[place])   ? Presence of implantable cardioverter-defibrillator (ICD)   ? GERD (gastroesophageal reflux disease)   ? Diabetes mellitus with diabetic neuropathy, with long-term current use of insulin ([place])   ? ? ?REFERRING DIAG: Bilateral BKA ? ?THERAPY DIAG:  ?Other abnormalities of gait and mobility ? ?Unsteadiness on feet ? ?PERTINENT HISTORY: CHF, COPD, DM, HTN,   ? ?PRECAUTIONS: Fall, 2L O2 prn, AICD present ? ?SUBJECTIVE: Pt saw Dr. Sharol Given and got a good report. Reports he still has the SOB on and off. Reports that he did try a nebulizer treatment and it did ok. Does feel he is peeing a lot during the day and having some pain in stomach and cramping.  ? ?PAIN:  ?Are you having pain? No ? ?Vitals: O2 sat=93-96% upon arrival. ? ? ?TODAY'S TREATMENT:  ?09/07/21: ? ? ?Exercises- verbal cues for form with exercises below ?Side Stepping with Counter Support - 1 x daily - 5 x weekly - 1 sets - 3-4 reps ?Marching Forward/backwards walk at counter - 1 x daily - 5 x weekly - 1 sets - 3-4 reps ?Wide Stance with moving cones across body- 1 x daily - 5 x weekly - 2 sets -  5 reps ? ?Sit to stand from elevated mat x 5 without hands with cues to lean forward. ? ?Standing in front of mat: playing catch with 2.2# ball with PT spotting for 3 min. Then D1 diagonals moving ball x 5 each direction. CGA and verbal cues to keep slight bend in knees to come forward. Pt lost balance posterior a couple times. ?Pt's fingers very cold at end and took a couple minutes for O2 to come from 78 to 90%. ? ? ? ? ?PATIENT EDUCATION: ?Education details: HEP. Pt was also instructed to check with MD about cramping/stomach pain he is having. Noted he is not on potassium and takes 2 fluid pills.  ?Person educated: Patient and Spouse ?Education method: Explanation, demonstration and handout ?Education comprehension: verbalized understanding ? ? ?HOME EXERCISE PROGRAM: ?RYAYYXJA ? ? ?SHORT TERM GOALS: ? ?Pt will be independent with prosthetic management for improved function. ?Baseline:  ?Target date: 10/05/2021 ?Goal status: INITIAL ? ?2.  Pt will be able to tolerate wearing prosthesis 8 hours per day with no skin issues for improved function.  ?Baseline: 08/31/21 5 hours/day ?Target date: 10/05/2021 ?Goal status: INITIAL ? ?3.  Pt will increase Berg from 33/56 to >36/56 for improved balance and decreased fall  risk. ?Baseline: 08/31/21 33/56 ?Target date: 10/05/2021 ?Goal status: INITIAL ? ?4.  Pt will be able to demonstrate appropriate breathing techniques during activities to be able to decrease his SOB. ?Baseline: needs

## 2021-09-08 ENCOUNTER — Other Ambulatory Visit (HOSPITAL_COMMUNITY): Payer: Self-pay | Admitting: Cardiology

## 2021-09-09 ENCOUNTER — Other Ambulatory Visit (HOSPITAL_COMMUNITY): Payer: Self-pay | Admitting: Cardiology

## 2021-09-09 ENCOUNTER — Other Ambulatory Visit: Payer: Self-pay

## 2021-09-09 ENCOUNTER — Encounter (HOSPITAL_COMMUNITY): Payer: Self-pay | Admitting: Cardiology

## 2021-09-09 ENCOUNTER — Ambulatory Visit (HOSPITAL_COMMUNITY)
Admission: RE | Admit: 2021-09-09 | Discharge: 2021-09-09 | Disposition: A | Discharge status: Home / Self Care | Payer: Medicare Other | Source: Ambulatory Visit | Attending: Cardiology | Admitting: Cardiology

## 2021-09-09 VITALS — Wt 239.8 lb

## 2021-09-09 DIAGNOSIS — I428 Other cardiomyopathies: Secondary | ICD-10-CM | POA: Diagnosis not present

## 2021-09-09 DIAGNOSIS — Z95810 Presence of automatic (implantable) cardiac defibrillator: Secondary | ICD-10-CM | POA: Diagnosis not present

## 2021-09-09 DIAGNOSIS — J449 Chronic obstructive pulmonary disease, unspecified: Secondary | ICD-10-CM | POA: Diagnosis not present

## 2021-09-09 DIAGNOSIS — E785 Hyperlipidemia, unspecified: Secondary | ICD-10-CM | POA: Diagnosis not present

## 2021-09-09 DIAGNOSIS — Z955 Presence of coronary angioplasty implant and graft: Secondary | ICD-10-CM | POA: Diagnosis not present

## 2021-09-09 DIAGNOSIS — G4733 Obstructive sleep apnea (adult) (pediatric): Secondary | ICD-10-CM | POA: Diagnosis not present

## 2021-09-09 DIAGNOSIS — I5023 Acute on chronic systolic (congestive) heart failure: Secondary | ICD-10-CM | POA: Diagnosis present

## 2021-09-09 DIAGNOSIS — I2510 Atherosclerotic heart disease of native coronary artery without angina pectoris: Secondary | ICD-10-CM | POA: Insufficient documentation

## 2021-09-09 DIAGNOSIS — D751 Secondary polycythemia: Secondary | ICD-10-CM | POA: Diagnosis not present

## 2021-09-09 DIAGNOSIS — Z79899 Other long term (current) drug therapy: Secondary | ICD-10-CM | POA: Insufficient documentation

## 2021-09-09 DIAGNOSIS — E781 Pure hyperglyceridemia: Secondary | ICD-10-CM | POA: Diagnosis not present

## 2021-09-09 DIAGNOSIS — E1122 Type 2 diabetes mellitus with diabetic chronic kidney disease: Secondary | ICD-10-CM | POA: Diagnosis not present

## 2021-09-09 DIAGNOSIS — Z7982 Long term (current) use of aspirin: Secondary | ICD-10-CM | POA: Insufficient documentation

## 2021-09-09 DIAGNOSIS — I130 Hypertensive heart and chronic kidney disease with heart failure and stage 1 through stage 4 chronic kidney disease, or unspecified chronic kidney disease: Secondary | ICD-10-CM | POA: Diagnosis not present

## 2021-09-09 DIAGNOSIS — I5022 Chronic systolic (congestive) heart failure: Secondary | ICD-10-CM | POA: Diagnosis not present

## 2021-09-09 LAB — BASIC METABOLIC PANEL
Anion gap: 10 (ref 5–15)
BUN: 24 mg/dL — ABNORMAL HIGH (ref 6–20)
CO2: 30 mmol/L (ref 22–32)
Calcium: 9.3 mg/dL (ref 8.9–10.3)
Chloride: 99 mmol/L (ref 98–111)
Creatinine, Ser: 1.31 mg/dL — ABNORMAL HIGH (ref 0.61–1.24)
GFR, Estimated: >60 mL/min (ref >60)
Glucose, Bld: 283 mg/dL — ABNORMAL HIGH (ref 70–99)
Potassium: 4.2 mmol/L (ref 3.5–5.1)
Sodium: 139 mmol/L (ref 135–145)

## 2021-09-09 LAB — LIPID PANEL
Cholesterol: 82 mg/dL (ref 0–200)
HDL: 26 mg/dL — ABNORMAL LOW (ref >40)
LDL Cholesterol: 42 mg/dL (ref 0–99)
Total CHOL/HDL Ratio: 3.2 RATIO
Triglycerides: 68 mg/dL (ref <150)
VLDL: 14 mg/dL (ref 0–40)

## 2021-09-09 LAB — BRAIN NATRIURETIC PEPTIDE: B Natriuretic Peptide: 754.2 pg/mL — ABNORMAL HIGH (ref 0.0–100.0)

## 2021-09-09 LAB — DIGOXIN LEVEL: Digoxin Level: <0.2 ng/mL — ABNORMAL LOW (ref 0.8–2.0)

## 2021-09-09 MED ORDER — DIGOXIN 125 MCG PO TABS: 0.1250 mg | ORAL_TABLET | Freq: Every day | ORAL | 3 refills | Status: DC

## 2021-09-09 MED ORDER — TORSEMIDE 20 MG PO TABS: 60.0000 mg | ORAL_TABLET | Freq: Every day | ORAL | 4 refills | Status: DC

## 2021-09-09 MED ORDER — ISOSORBIDE MONONITRATE ER 60 MG PO TB24: 60.0000 mg | ORAL_TABLET | Freq: Every day | ORAL | 4 refills | Status: DC

## 2021-09-09 NOTE — Patient Instructions (Signed)
Medication Changes: ? ?Increase Imdur to 60 mg daily ? ?Increase Torsemide to 80 mg daily for 2 days,  then start 60 mg daily after that  ? ?Lab Work: ? ?Labs done today, your results will be available in MyChart, we will contact you for abnormal readings. ? ? ?Testing/Procedures: ? ?Repeat blood work in 10 days ? ?Referrals: ? ?none ? ?Special Instructions // Education: ? ?Take all medications as prescribed by your provider ? ?Follow-Up in: 10 days  ? ?At the [facility], you and your health needs are our priority. We have a designated team specialized in the treatment of Heart Failure. This Care Team includes your primary Heart Failure Specialized Cardiologist (physician), Advanced Practice Providers (APPs- Physician Assistants and Nurse Practitioners), and Pharmacist who all work together to provide you with the care you need, when you need it.  ? ?You may see any of the following providers on your designated Care Team at your next follow up: ? ?Dr Arvilla Meres ?Dr Marca Ancona ?Tonye Becket, NP ?Robbie Lis, PA ?Jessica Milford,NP ?Anna Genre, PA ?Karle Plumber, PharmD ? ? ?Please be sure to bring in all your medications bottles to every appointment.  ? ?Need to Contact us: ? ?If you have any questions or concerns before your next appointment please send [place] a message through [place] or call our office at [phone].   ? ?TO LEAVE A MESSAGE FOR THE NURSE SELECT OPTION 2, PLEASE LEAVE A MESSAGE INCLUDING: ?YOUR NAME ?DATE OF BIRTH ?CALL BACK NUMBER ?REASON FOR CALL**this is important as we prioritize the call backs ? ?YOU WILL RECEIVE A CALL BACK THE SAME DAY AS LONG AS YOU CALL BEFORE 4:00 PM ? ? ?

## 2021-09-11 NOTE — Progress Notes (Signed)
? ? ?  ? ? ?[facility] Note  ? ?ID:  Richard [name], DOB [birth year], MRN [id number]   ?Provider location: [facility] ?Type of Visit: Established patient  ? ?PCP:  Robert Bellow, PA-C ?Cardiologist:  Dr. Aundra Dubin ?  ?History of Present Illness: ?Richard [name] is a 61 y.o. male who has a history of chronic systolic HF s/p Medtronic ICD (12/2016), HTN, DM, and HLD.  ? ?Admitted [id number] with A/C systolic HF. Echo showed EF 15-20%. Advanced HF team was consulted. He diuresed 20 lbs with IV lasix, then transitioned to torsemide 40 mg daily. Underwent R/LHC showing a focal severe RCA stenosis treated with DES.  He did not, however, appear to have severe enough coronary disease to explain his cardiomyopathy. Cardiac index was low at 1.9.  HF meds were optimized. Beta blocker was not started due to low CI and soft BPs. He was discharged on home oxygen. Referred to cardiac rehab. DC weight: 260 lbs.  ? ?Echo in 2/20 showed EF 20-25% with severe LV dilation, moderate RV enlargement with moderately decreased RV systolic function.  CPX in 2/21 showed a severe functional limitation but it actually appeared to be primarily due to pulmonary issues.  ? ?Echo in 1/22 showed EF < 20% with severe LV dilation, normal RV size with moderately decreased systolic function, dilated IVC.  ? ?He was admitted in 4/22 with CHF.  ? ?TEE in 7/22 showed EF 20% with severe LV dilation, moderate RV enlargement with moderately decreased RV systolic function.  ? ?Patient had bilateral BKAs in 2022 with osteomyelitis and PAD.  ? ?He returns today for f/u of CHF.  He has leg prosthetics and is able to walk with a cane. He continues to work with PT.  He uses 2 L home oxygen at all times.  He is currently out of digoxin and torsemide (just ran out).  He has been more short of breath for the last couple of weeks.  He is short of breath after walking 100 feet or with inclines.  +Nausea.  He has orthopnea as well as  occasional PND episodes.  BP is mildly elevated today but he has not taken morning meds.   ? ?Medtronic device interrogation: No VT/AF, decreased thoracic impedance.  ? ?Labs (10/19): K 3.9, creatinine 1.18  ?Labs (10/20): BNP 160, K 3.8, creatinine 1.31 ?Labs (4/21): TGs 475, LDL 67, K 4.2, creatinine 1.8, urine and serum immunofixations negative.  ?Labs (11/21): LDL 42, HDL 27, K 4.3, creatinine 1.81 ?Labs (5/22): K 4.5, creatinine 1.46 ?Labs (11/19/20): SCr 2.0, 4.5  ?Labs (11/22): K 4.6, creatinine 1.15 ? ?PMH: ?1. Chronic systolic CHF: Primarily nonischemic cardiomyopathy.  Has Medtronic ICD.   ?- Echo 5/19: EF 15-20%, grade 2 DD, mild MR, RV severely reduced, LA moderately dilated, RA mildly dilated, mild TR.  ?- RHC/LHC (5/19): 80% distal LAD stenosis, 80% proximal RCA stenosis treated with DES to pRCA. Mean RA 13, PA 69/31 mean 47, mean 24, CI 1.91, PVR 4.8 WU.  ?- Gynecomastia with spironolactone. ?- Echo (2/20): EF 20-25%, severe LV enlargement, moderate RV enlargement, moderately decreased RV systolic function.  ?- CPX (2/21): peak VO2 13.2, VE/VCO2 slope 24, RER 1.10. Severe functional limitation due to OHS with severe restrictive lung disease without significant HF limitation.  ?- PYP scan (7/21): Negative for TTR cardiac amyloidosis.  ?- Echo (1/22): EF < 20% with severe LV dilation, normal RV size with moderately decreased systolic function, dilated IVC.  ?-  TEE (7/22): EF 20% with severe LV dilation, moderate RV enlargement with moderately decreased RV systolic function.  ?2. COPD: Uses 2L home oxygen. Never smoked, but apparently had significant occupational exposure.  It appears that he won a lawsuit dealing with the occupational exposure-related COPD.  ?3. CAD: Cath in 5/17 with 60% RCA stenosis.  ?- LHC (5/19): 80% distal LAD, 80% proximal RCA.  DES to proximal RCA.  ?4. Polycythemia: Probably due to chronic hypoxemia.  ?5. Type II diabetes.  ?6. HTN ?7. GERD ?8. CKD: Stage 3.  Likely due to  diabetes and HTN.  ?9. OSA: Unable to tolerate CPAP.  ?10. PAD: Right and left BKA 2022.  ?11. 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 Group B Strep bacteremia ? ?Review of systems complete and found to be negative unless listed in HPI.   ? ?Current Outpatient Medications  ?Medication Sig Dispense Refill  ? albuterol (VENTOLIN HFA) 108 (90 Base) MCG/ACT inhaler Inhale 1-2 puffs into the lungs every 4 (four) hours as needed for wheezing or shortness of breath. 8 g 1  ? allopurinol (ZYLOPRIM) 100 MG tablet Take 1 tablet (100 mg total) by mouth daily. 30 tablet 3  ? aspirin 81 MG chewable tablet Chew 81 mg by mouth daily.    ? bisoprolol (ZEBETA) 5 MG tablet Take 0.5 tablets (2.5 mg total) by mouth daily. 30 tablet 0  ? busPIRone (BUSPAR) 5 MG tablet Take 1 tablet (5 mg total) by mouth 2 (two) times daily. 60 tablet 0  ? docusate sodium (COLACE) 100 MG capsule Take 1 capsule (100 mg total) by mouth daily. 10 capsule 0  ? DULoxetine (CYMBALTA) 60 MG capsule Take 1 capsule (60 mg total) by mouth daily. 30 capsule 2  ? empagliflozin (JARDIANCE) 10 MG TABS tablet Take 1 tablet (10 mg total) by mouth daily. 30 tablet 0  ? ENTRESTO 24-26 MG TAKE ONE TABLET BY MOUTH TWICE A DAY 60 tablet 0  ? eplerenone (INSPRA) 50 MG tablet TAKE ONE TABLET BY MOUTH DAILY 30 tablet 5  ? Fluticasone Furoate-Vilanterol (BREO ELLIPTA IN) Inhale into the lungs daily as needed.    ? gabapentin (NEURONTIN) 100 MG capsule Take 2 capsules (200 mg total) by mouth 3 (three) times daily. 180 capsule 0  ? hydrALAZINE (APRESOLINE) 50 MG tablet Take 1.5 tablets (75 mg total) by mouth 3 (three) times daily. NEEDS APPOINTMENT FOR MORE REFILLS 540 tablet 0  ? insulin regular (NOVOLIN R) 100 units/mL injection Inject into the skin as needed for high blood sugar. As per sliding scale    ? MAGNESIUM-OXIDE 400 (241.3 Mg) MG tablet Take 1 tablet (400 mg total) by mouth daily. 30 tablet 11  ? melatonin 3 MG TABS tablet TAKE ONE TABLET BY MOUTH AT BEDTIME 30  tablet 0  ? Multiple Vitamin (MULTIVITAMIN WITH MINERALS) TABS tablet Take 1 tablet by mouth daily.    ? spironolactone (ALDACTONE) 25 MG tablet Take 0.5 tablets (12.5 mg total) by mouth daily. 30 tablet 0  ? zinc sulfate 220 (50 Zn) MG capsule Take 1 capsule (220 mg total) by mouth daily. 30 capsule 0  ? atorvastatin (LIPITOR) 40 MG tablet TAKE ONE TABLET BY MOUTH DAILY 90 tablet 3  ? clopidogrel (PLAVIX) 75 MG tablet TAKE ONE TABLET BY MOUTH DAILY 30 tablet 11  ? digoxin (LANOXIN) 0.125 MG tablet Take 1 tablet (0.125 mg total) by mouth daily. 60 tablet 3  ? fenofibrate (TRICOR) 145 MG tablet TAKE ONE TABLET BY MOUTH  DAILY 90 tablet 3  ? insulin glargine-yfgn (SEMGLEE, YFGN,) 100 UNIT/ML injection Inject 0.08 mLs (8 Units total) into the skin 2 (two) times daily. (Patient not taking: Reported on 09/09/2021) 10 mL 11  ? isosorbide mononitrate (IMDUR) 60 MG 24 hr tablet Take 1 tablet (60 mg total) by mouth daily. 30 tablet 4  ? torsemide (DEMADEX) 20 MG tablet Take 3 tablets (60 mg total) by mouth daily. 90 tablet 4  ? ?No current facility-administered medications for this encounter.  ? ?Allergies  ?Allergen Reactions  ? Aspirin Itching  ? Codeine Hives  ? Coconut Oil Itching  ? ? ?Social History  ? ?Socioeconomic History  ? Marital status: Divorced  ?  Spouse name: Not on file  ? Number of children: Not on file  ? Years of education: Not on file  ? Highest education level: Not on file  ?Occupational History  ? Occupation: disability  ?  Comment: stopped working in 2000 d/t occupational exposures  ?Tobacco Use  ? Smoking status: Former  ?  Types: Cigarettes  ? Smokeless tobacco: Never  ? Tobacco comments:  ?  "smoked when I drank"  ?Vaping Use  ? Vaping Use: Never used  ?Substance and Sexual Activity  ? Alcohol use: Not Currently  ?  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 Drug use: Never  ? Sexual activity: Not Currently  ?Other Topics Concern  ? Not on file  ?Social History Narrative  ?  Patient lives at home with wife and some of his 10 children. He self-administers his own medications.  ? ?Social Determinants of Health  ? ?Financial Resource Strain: Low Risk   ? Difficulty of Paying Living E

## 2021-09-12 NOTE — Progress Notes (Signed)
Remote ICD transmission.   

## 2021-09-14 ENCOUNTER — Other Ambulatory Visit: Payer: Self-pay

## 2021-09-14 ENCOUNTER — Ambulatory Visit: Payer: Medicare Other

## 2021-09-14 DIAGNOSIS — R2681 Unsteadiness on feet: Secondary | ICD-10-CM

## 2021-09-14 DIAGNOSIS — R2689 Other abnormalities of gait and mobility: Secondary | ICD-10-CM | POA: Diagnosis not present

## 2021-09-14 DIAGNOSIS — M6281 Muscle weakness (generalized): Secondary | ICD-10-CM

## 2021-09-14 NOTE — Therapy (Signed)
?OUTPATIENT PHYSICAL THERAPY TREATMENT NOTE ? ? ?Patient Name: Richard [name] ?MRN: [id number] ?DOB:[birth year], 61 y.o., male ?Today's Date: 09/14/2021 ? ?PCP: Robert Bellow, PA-C ?REFERRING PROVIDER: Robert Bellow, PA-C ? ?  ? ? ? PT End of Session - 09/14/21 1626   ? ? Visit Number 12   ? Number of Visits 17   ? Date for PT Re-Evaluation 10/21/21   ? Authorization Type MC/MCD   ? Progress Note Due on Visit 10   ? PT Start Time 1620   ? PT Stop Time [id number]   ? PT Time Calculation (min) 44 min   ? Equipment Utilized During Treatment Gait belt   ? Activity Tolerance Patient tolerated treatment well   ? Behavior During Therapy [facility] for tasks assessed/performed   ? ?  ?  ? ?  ? ? ?Past Medical History:  ?Diagnosis Date  ? AICD (automatic cardioverter/defibrillator) present 01/11/2017  ? Anxiety   ? CHF (congestive heart failure) ([place])   ? CKD (chronic kidney disease)   ? COPD (chronic obstructive pulmonary disease) ([place])   ? never smoker, industrial exposure  ? Diabetes mellitus with diabetic neuropathy, with long-term current use of insulin ([place])   ? GERD (gastroesophageal reflux disease)   ? High cholesterol   ? History of gout   ? "take RX qd" (11/01/2017)  ? Hypertension   ? Nonobstructive atherosclerosis of coronary artery   ? On home oxygen therapy   ? "2L prn" (11/01/2017)  ? 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[place])   ? LLL, present for years  ? Systolic HF (heart failure) ([place])   ? ?Past Surgical History:  ?Procedure Laterality Date  ? ABDOMINAL AORTOGRAM W/LOWER EXTREMITY N/A 02/01/2021  ? Procedure: ABDOMINAL AORTOGRAM W/LOWER EXTREMITY;  Surgeon: Serafina Mitchell, MD;  Location: [place] [facility];  Service: Cardiovascular;  Laterality: N/A;  ? AMPUTATION Left 01/21/2021  ? Procedure: AMPUTATION BELOW KNEE;  Surgeon: Newt Minion, MD;  Location: [place];  Service: Orthopedics;  Laterality: Left;  ? AMPUTATION Right 02/04/2021  ? Procedure: RIGHT FOOT 1ST AND 2ND RAY AMPUTATION;  Surgeon: Newt Minion, MD;  Location: [place];   Service: Orthopedics;  Laterality: Right;  ? AMPUTATION Right 03/02/2021  ? Procedure: RIGHT BELOW KNEE AMPUTATION;  Surgeon: Newt Minion, MD;  Location: [place];  Service: Orthopedics;  Laterality: Right;  ? APPLICATION OF WOUND VAC Left 01/21/2021  ? Procedure: APPLICATION OF WOUND VAC;  Surgeon: Newt Minion, MD;  Location: [place];  Service: Orthopedics;  Laterality: Left;  ? APPLICATION OF WOUND VAC  02/04/2021  ? Procedure: APPLICATION OF WOUND VAC;  Surgeon: Newt Minion, MD;  Location: [place];  Service: Orthopedics;;  ? APPLICATION OF WOUND VAC  03/02/2021  ? Procedure: APPLICATION OF WOUND VAC;  Surgeon: Newt Minion, MD;  Location: [place];  Service: Orthopedics;;  ? CATARACT EXTRACTION W/ INTRAOCULAR LENS IMPLANTW/ TRABECULECTOMY Left ~ 2013  ? "may have done this when I had my other eye OR"  ? CIRCUMCISION    ? CORONARY STENT INTERVENTION N/A 11/02/2017  ? Procedure: CORONARY STENT INTERVENTION;  Surgeon: Larey Dresser, MD;  Location: [facility];  Service: Cardiovascular;  Laterality: N/A;  ? CORONARY STENT INTERVENTION N/A 11/02/2017  ? Procedure: CORONARY STENT INTERVENTION;  Surgeon: Belva Crome, MD;  Location: [place] [facility];  Service: Cardiovascular;  Laterality: N/A;  ? EYE SURGERY Left ~ 2013  ? "fell on something in basement; stuck  in my eye"  ? ICD IMPLANT    ? Medtronic Dual Chamber 01/11/17  ? MULTIPLE TOOTH EXTRACTIONS    ? "pulled all my top teeth"  ? PERIPHERAL VASCULAR BALLOON ANGIOPLASTY  02/01/2021  ? Procedure: PERIPHERAL VASCULAR BALLOON ANGIOPLASTY;  Surgeon: Serafina Mitchell, MD;  Location: [place] [facility];  Service: Cardiovascular;;  TP Trunk / PT  ? RIGHT/LEFT HEART CATH AND CORONARY ANGIOGRAPHY N/A 11/02/2017  ? Procedure: RIGHT/LEFT HEART CATH AND CORONARY ANGIOGRAPHY;  Surgeon: Larey Dresser, MD;  Location: [place] [facility];  Service: Cardiovascular;  Laterality: N/A;  ? TEE WITHOUT CARDIOVERSION N/A 01/20/2021  ? Procedure: TRANSESOPHAGEAL ECHOCARDIOGRAM (TEE);   Surgeon: Larey Dresser, MD;  Location: [facility];  Service: Cardiovascular;  Laterality: N/A;  ? ?Patient Active Problem List  ? Diagnosis Date Noted  ? Hyperlipidemia 09/09/2021  ? Wound dehiscence 03/02/2021  ? Subacute osteomyelitis, right ankle and foot ([place])   ? Sleep disturbance   ? Essential hypertension   ? Diabetic peripheral neuropathy ([name])   ? Labile blood glucose   ? Left below-knee amputee (Richard [name]) 02/07/2021  ? PAD (peripheral artery disease) ([place]) 02/01/2021  ? Below-knee amputation of left lower extremity (Richard [name]) 01/27/2021  ? S/P BKA (below knee amputation), left ([place])   ? 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[name])   ? SOB (shortness of breath)   ? Open wound   ? Bacteremia   ? Chronic ulcer of left ankle ([place])   ? Severe sepsis without septic shock ([name])   ? COPD exacerbation ([name]) 01/15/2021  ? Chronic systolic CHF (congestive heart failure) ([place]) 01/15/2021  ? Acute on chronic respiratory failure with hypoxia and hypercapnia ([place]) 01/15/2021  ? Acute renal failure superimposed on stage 3 chronic kidney disease ([place]) 01/15/2021  ? Elevated troponin 01/15/2021  ? Hyponatremia 01/15/2021  ? CHF exacerbation ([name]) 10/17/2020  ? Acute on chronic systolic CHF (congestive heart failure) ([name]) 10/16/2020  ? Acute on chronic combined systolic and diastolic CHF (congestive heart failure) ([name]) 03/15/2018  ? S/P right and left heart catheterization 11/02/2017  ? Panic attacks 11/02/2017  ? Non-STEMI (non-ST elevated myocardial infarction) ([name])   ? COPD (chronic obstructive pulmonary disease) ([name])   ? Hypertension   ? CKD (chronic kidney disease)   ? 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[name])   ? Presence of implantable cardioverter-defibrillator (ICD)   ? GERD (gastroesophageal reflux disease)   ? Diabetes mellitus with diabetic neuropathy, with long-term current use of insulin ([name])   ? ? ?REFERRING DIAG: Bilateral BKA ? ?THERAPY DIAG:  ?Other abnormalities of gait and mobility ? ?Muscle weakness  (generalized) ? ?Unsteadiness on feet ? ?PERTINENT HISTORY: CHF, COPD, DM, HTN,  ? ?PRECAUTIONS: Fall, 2L O2 prn, AICD present ? ?SUBJECTIVE: Pt saw heart failure clinic on Friday. They refilled his torsemide and he reports taking 4x/day. He did not take the fluid pills today as concerned with having to pee in therapy too much. No addition of potassium but he has been eating more bananas. He does feel pretty SOB.  ? ?PAIN:  ?Are you having pain? No ? ?Vitals: O2 sat=97% after sitting a minute after arriving.  ? ? ?TODAY'S TREATMENT:  ?09/14/21: ?At counter: Side stepping 8' x 6, walking with 1 UE support forwards and backwards 8' x 2 each with CGA. Standing without UE support with head turns left/right and up/down x 10. Standing reaching to upper cabinet with both hands x 10. ?In // bars: Step-ups on 4" step with  1 UE support x 5 each leg. Verbal cues for erect posture.  ?Standing on blue mat without UE support x 30 sec then staggered stance x 30 sec each position CGA,  then tossing bean bags with each hand x 14 keeping balance with reaching across body to pick up bean bag from table. Feet were shoulder width apart with no UE support close SBA/CGA for safety. ?O2 sat=85-95%.  ? ? ?09/07/21: ? ? ?Exercises- verbal cues for form with exercises below ?Side Stepping with Counter Support - 1 x daily - 5 x weekly - 1 sets - 3-4 reps ?Marching Forward/backwards walk at counter - 1 x daily - 5 x weekly - 1 sets - 3-4 reps ?Wide Stance with moving cones across body- 1 x daily - 5 x weekly - 2 sets - 5 reps ? ?Sit to stand from elevated mat x 5 without hands with cues to lean forward. ? ?Standing in front of mat: playing catch with 2.2# ball with PT spotting for 3 min. Then D1 diagonals moving ball x 5 each direction. CGA and verbal cues to keep slight bend in knees to come forward. Pt lost balance posterior a couple times. ?Pt's fingers very cold at end and took a couple minutes for O2 to come from 78 to 90%. ? ? ? ? ?PATIENT  EDUCATION: ? ? ? ?HOME EXERCISE PROGRAM: ?RYAYYXJA ? ? ?SHORT TERM GOALS: ? ?Pt will be independent with prosthetic management for improved function. ?Baseline:  ?Target date: 10/12/2021 ?Goal status: INITIAL ? ?2.  Pt wil

## 2021-09-19 ENCOUNTER — Other Ambulatory Visit: Payer: Self-pay | Admitting: Cardiology

## 2021-09-21 ENCOUNTER — Ambulatory Visit: Payer: Medicare Other | Admitting: Physical Therapy

## 2021-09-21 ENCOUNTER — Encounter: Payer: Self-pay | Admitting: Physical Therapy

## 2021-09-21 ENCOUNTER — Telehealth (HOSPITAL_COMMUNITY): Payer: Self-pay

## 2021-09-21 DIAGNOSIS — R2689 Other abnormalities of gait and mobility: Secondary | ICD-10-CM

## 2021-09-21 DIAGNOSIS — M6281 Muscle weakness (generalized): Secondary | ICD-10-CM

## 2021-09-21 DIAGNOSIS — R293 Abnormal posture: Secondary | ICD-10-CM

## 2021-09-21 DIAGNOSIS — R2681 Unsteadiness on feet: Secondary | ICD-10-CM

## 2021-09-21 NOTE — Telephone Encounter (Signed)
Called and left patient a message to confirm/remind patient of their appointment at [facility] on 09/21/21.  ? ?  ? ?

## 2021-09-21 NOTE — Therapy (Signed)
?OUTPATIENT PHYSICAL THERAPY TREATMENT NOTE ? ? ?Patient Name: Richard [name] ?MRN: [id number] ?DOB:05/08/1961, 61 y.o., male ?Today's Date: 09/21/2021 ? ?PCP: Robert Bellow, PA-C ?REFERRING PROVIDER: Robert Bellow, PA-C ? ?  ? ? ? PT End of Session - 09/21/21 1538   ? ? Visit Number 13   ? Number of Visits 17   ? Date for PT Re-Evaluation 10/21/21   ? Authorization Type MC/MCD   ? Progress Note Due on Visit 20   ? PT Start Time 1535   ? PT Stop Time 1615   ? PT Time Calculation (min) 40 min   ? Equipment Utilized During Treatment Gait belt   ? Activity Tolerance Patient tolerated treatment well   ? Behavior During Therapy [facility] for tasks assessed/performed   ? ?  ?  ? ?  ? ? ?Past Medical History:  ?Diagnosis Date  ? AICD (automatic cardioverter/defibrillator) present 01/11/2017  ? Anxiety   ? CHF (congestive heart failure) ([place])   ? CKD (chronic kidney disease)   ? COPD (chronic obstructive pulmonary disease) ([place])   ? never smoker, industrial exposure  ? Diabetes mellitus with diabetic neuropathy, with long-term current use of insulin ([place])   ? GERD (gastroesophageal reflux disease)   ? High cholesterol   ? History of gout   ? "take RX qd" (11/01/2017)  ? Hypertension   ? Nonobstructive atherosclerosis of coronary artery   ? On home oxygen therapy   ? "2L prn" (11/01/2017)  ? 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[place])   ? LLL, present for years  ? Systolic HF (heart failure) ([place])   ? ?Past Surgical History:  ?Procedure Laterality Date  ? ABDOMINAL AORTOGRAM W/LOWER EXTREMITY N/A 02/01/2021  ? Procedure: ABDOMINAL AORTOGRAM W/LOWER EXTREMITY;  Surgeon: Serafina Mitchell, MD;  Location: [place] [facility];  Service: Cardiovascular;  Laterality: N/A;  ? AMPUTATION Left 01/21/2021  ? Procedure: AMPUTATION BELOW KNEE;  Surgeon: Newt Minion, MD;  Location: [place];  Service: Orthopedics;  Laterality: Left;  ? AMPUTATION Right 02/04/2021  ? Procedure: RIGHT FOOT 1ST AND 2ND RAY AMPUTATION;  Surgeon: Newt Minion, MD;  Location: [place];   Service: Orthopedics;  Laterality: Right;  ? AMPUTATION Right 03/02/2021  ? Procedure: RIGHT BELOW KNEE AMPUTATION;  Surgeon: Newt Minion, MD;  Location: [place];  Service: Orthopedics;  Laterality: Right;  ? APPLICATION OF WOUND VAC Left 01/21/2021  ? Procedure: APPLICATION OF WOUND VAC;  Surgeon: Newt Minion, MD;  Location: [place];  Service: Orthopedics;  Laterality: Left;  ? APPLICATION OF WOUND VAC  02/04/2021  ? Procedure: APPLICATION OF WOUND VAC;  Surgeon: Newt Minion, MD;  Location: [place];  Service: Orthopedics;;  ? APPLICATION OF WOUND VAC  03/02/2021  ? Procedure: APPLICATION OF WOUND VAC;  Surgeon: Newt Minion, MD;  Location: [place];  Service: Orthopedics;;  ? CATARACT EXTRACTION W/ INTRAOCULAR LENS IMPLANTW/ TRABECULECTOMY Left ~ 2013  ? "may have done this when I had my other eye OR"  ? CIRCUMCISION    ? CORONARY STENT INTERVENTION N/A 11/02/2017  ? Procedure: CORONARY STENT INTERVENTION;  Surgeon: Larey Dresser, MD;  Location: [place] [facility];  Service: Cardiovascular;  Laterality: N/A;  ? CORONARY STENT INTERVENTION N/A 11/02/2017  ? Procedure: CORONARY STENT INTERVENTION;  Surgeon: Belva Crome, MD;  Location: [place] [facility];  Service: Cardiovascular;  Laterality: N/A;  ? EYE SURGERY Left ~ 2013  ? "fell on something in basement; stuck  in my eye"  ? ICD IMPLANT    ? Medtronic Dual Chamber 01/11/17  ? MULTIPLE TOOTH EXTRACTIONS    ? "pulled all my top teeth"  ? PERIPHERAL VASCULAR BALLOON ANGIOPLASTY  02/01/2021  ? Procedure: PERIPHERAL VASCULAR BALLOON ANGIOPLASTY;  Surgeon: Serafina Mitchell, MD;  Location: [place] [facility];  Service: Cardiovascular;;  TP Trunk / PT  ? RIGHT/LEFT HEART CATH AND CORONARY ANGIOGRAPHY N/A 11/02/2017  ? Procedure: RIGHT/LEFT HEART CATH AND CORONARY ANGIOGRAPHY;  Surgeon: Larey Dresser, MD;  Location: [place] [facility];  Service: Cardiovascular;  Laterality: N/A;  ? TEE WITHOUT CARDIOVERSION N/A 01/20/2021  ? Procedure: TRANSESOPHAGEAL ECHOCARDIOGRAM (TEE);   Surgeon: Larey Dresser, MD;  Location: [facility] ENDOSCOPY;  Service: Cardiovascular;  Laterality: N/A;  ? ?Patient Active Problem List  ? Diagnosis Date Noted  ? Hyperlipidemia 09/09/2021  ? Wound dehiscence 03/02/2021  ? Subacute osteomyelitis, right ankle and foot ([place])   ? Sleep disturbance   ? Essential hypertension   ? Diabetic peripheral neuropathy ([place])   ? Labile blood glucose   ? Left below-knee amputee ([place]) 02/07/2021  ? PAD (peripheral artery disease) ([place]) 02/01/2021  ? Below-knee amputation of left lower extremity ([place]) 01/27/2021  ? S/P BKA (below knee amputation), left ([place])   ? 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 SOB (shortness of breath)   ? Open wound   ? Bacteremia   ? Chronic ulcer of left ankle ([place])   ? Severe sepsis without septic shock ([place])   ? COPD exacerbation ([place]) 01/15/2021  ? Chronic systolic CHF (congestive heart failure) ([place]) 01/15/2021  ? Acute on chronic respiratory failure with hypoxia and hypercapnia ([place]) 01/15/2021  ? Acute renal failure superimposed on stage 3 chronic kidney disease ([place]) 01/15/2021  ? Elevated troponin 01/15/2021  ? Hyponatremia 01/15/2021  ? CHF exacerbation ([place]) 10/17/2020  ? Acute on chronic systolic CHF (congestive heart failure) ([place]) 10/16/2020  ? Acute on chronic combined systolic and diastolic CHF (congestive heart failure) ([place]) 03/15/2018  ? S/P right and left heart catheterization 11/02/2017  ? Panic attacks 11/02/2017  ? Non-STEMI (non-ST elevated myocardial infarction) ([place])   ? COPD (chronic obstructive pulmonary disease) ([place])   ? Hypertension   ? CKD (chronic kidney disease)   ? 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[place])   ? Presence of implantable cardioverter-defibrillator (ICD)   ? GERD (gastroesophageal reflux disease)   ? Diabetes mellitus with diabetic neuropathy, with long-term current use of insulin ([place])   ? ? ?REFERRING DIAG: Bilateral BKA ? ?THERAPY DIAG:  ?Other abnormalities of gait and mobility ? ?Muscle weakness  (generalized) ? ?Unsteadiness on feet ? ?Abnormal posture ? ?PERTINENT HISTORY: CHF, COPD, DM, HTN,  ? ?PRECAUTIONS: Fall, 2L O2 prn, AICD present ? ?SUBJECTIVE: No new complaints. Has not falls or pain to report. Has not needed his oxygen at home.  ? ?PAIN:  ?Are you having pain? No ? ?Vitals: O2 sat=91% after sitting a minute after arriving, recovered quickly with deep breathing. ? ? ?TODAY'S TREATMENT:  ?09/21/2021: ?CURRENT PROSTHETIC WEAR ASSESSMENT: ?Donning prosthesis: Modified independence ?Doffing prosthesis: Modified independence ?Prosthetic wear tolerance: 6 hours/day, daily days/week ?Residual limb condition: intact per pt report ? ?GAIT: ?Gait pattern: step through pattern and wide BOS ?Distance walked: 50 x 2 with prostheses only ?Assistive device utilized: Environmental consultant - 2 wheeled, prostheses, and None ?Level of assistance:  supervision with RW, min guard assist with prosthesis only ?Comments: cues to narrow base of support with gait. Pt  noted to have increased lateral sway with gait with prostheses only.   ? ? ?STRENGTHENING  ?Scifit LE's only (occasional UE use) on level 5.5 x 4 minutes with goal >/= 70 steps per minute (pt with oxygen on set at home settings via [place]). SaO2 98%, HR 104. Seated rest for ~2-3 minutes. Then level 3.5 x 4 more minutes with LE's only with goal 70-85 steps per minute for strengthening/activity tolerance. SaO2 95% afterwards.  ? ?DYNAMIC BALANCE:  ?Side Stepping: with occasional touch to parallel bars for 4 las each way with min guard to min assist for safety/balance. Cues on posture, forward gaze and step length.  ?Backwards Walking: for 4 laps with light to no touch on parallel bars with cues on posture and decreased step length. Min guard assist for safety. ?Marching: forward for 4 laps with light to no touch on parallel bars with cues for higher knees and weight shifting. Min guard assist for balance/safety.    ? ?STATIC BALANCE: ?Surface: Floor ?Position: Wide Base of  Support ?Completed with: Eyes Open and Eyes Closed;  EC for 30 sec's x 3 reps, progressing to EO for Head Turns x 10 Reps and Head Nods x 10 Reps. Min guard to min assist with no UE support. Cues on posture and weight shifting t

## 2021-09-21 NOTE — Progress Notes (Signed)
? ? ?  ? ? ?[facility] Note  ? ?ID:  Richard [name], DOB 1960-09-21, MRN [id number]   ?Provider location: [place] [facility] ?Type of Visit: Established patient  ? ?PCP:  Robert Bellow, PA-C ?Cardiologist:  Dr. Aundra Dubin ?  ?History of Present Illness: ?Richard [name] is a 61 y.o. male who has a history of chronic systolic HF s/p Medtronic ICD (12/2016), HTN, DM, and HLD.  ? ?Admitted [id number] with A/C systolic HF. Echo showed EF 15-20%. Advanced HF team was consulted. He diuresed 20 lbs with IV lasix, then transitioned to torsemide 40 mg daily. Underwent R/LHC showing a focal severe RCA stenosis treated with DES.  He did not, however, appear to have severe enough coronary disease to explain his cardiomyopathy. Cardiac index was low at 1.9.  HF meds were optimized. Beta blocker was not started due to low CI and soft BPs. He was discharged on home oxygen. Referred to cardiac rehab. DC weight: 260 lbs.  ? ?Echo in 2/20 showed EF 20-25% with severe LV dilation, moderate RV enlargement with moderately decreased RV systolic function.  CPX in 2/21 showed a severe functional limitation but it actually appeared to be primarily due to pulmonary issues.  ? ?Echo in 1/22 showed EF < 20% with severe LV dilation, normal RV size with moderately decreased systolic function, dilated IVC.  ? ?He was admitted in 4/22 with CHF.  ? ?TEE in 7/22 showed EF 20% with severe LV dilation, moderate RV enlargement with moderately decreased RV systolic function.  ? 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 ? ?Recently seen by Dr. Aundra Dubin on 3/17 and complained of worsening exertional dyspnea, NYHA Class III w/ evidenice of volume overload on exam and by Optivol. This was after running out of his torsemide and digoxin. Med were refilled/resumed and he was instructed to increase torsemide to 80 mg daily x 2 days then 60 mg daily. Imdur was also increased to 60 mg daily for  better BP control.  ? ?He returns back today for volume assessment. Here w/ wife. BP markedly low 78/58 checked x 2. EKG shows NSR 84 bpm but pulse ox captured low pulse HR readings in the 40s initially w/ frequent PVCs on rhythm strip. He feels tired but denies any current dizziness. Mentating well. Conversant. No recent CP. Reports dark/black stools ~2 weeks ago that have since resolved. He says is supposed to get a colonoscopy soon. He also reports decreased appetite and ~30 lb wt loss since his leg amputations. Walks w/ prosthetics. SOB w/ activity, NYHA Class II-III. Denies orthopnea/PND. Wt is down 8 lb since previous visit.  ? ?Device interrogation not c/w volume overload. Index below threshold. Impedence ok. Brief AT/AF in the last several weeks <10 min in duration. No VT/VF. Activity level ~1 hr/day.  ? ?He says he took "about half" of his BP meds this morning but unable to tell me which ones he took. He is certain that he did not take Entresto yet. He is no longer followed by paramedicine. Wife requesting that he be re-enrolled.  ? ? ? ?Medtronic device interrogation: Index below threshold. Impedence ok. Brief AT/AF in the last several weeks <10 min in duration. No VT/VF. Activity level ~1 hr/day.  ? ?Labs (10/19): K 3.9, creatinine 1.18  ?Labs (10/20): BNP 160, K 3.8, creatinine 1.31 ?Labs (4/21): TGs 475, LDL 67, K 4.2, creatinine 1.8, urine and serum immunofixations negative.  ?Labs (11/21): LDL  42, HDL 27, K 4.3, creatinine 1.81 ?Labs (5/22): K 4.5, creatinine 1.46 ?Labs (11/19/20): SCr 2.0, 4.5  ?Labs (11/22): K 4.6, creatinine 1.15 ?Labs (3/22): K 4.3, creatinine 1.46. LDL 42, TG 68  ? ?PMH: ?1. Chronic systolic CHF: Primarily nonischemic cardiomyopathy.  Has Medtronic ICD.   ?- Echo 5/19: EF 15-20%, grade 2 DD, mild MR, RV severely reduced, LA moderately dilated, RA mildly dilated, mild TR.  ?- RHC/LHC (5/19): 80% distal LAD stenosis, 80% proximal RCA stenosis treated with DES to pRCA. Mean RA 13, PA  69/31 mean 47, mean 24, CI 1.91, PVR 4.8 WU.  ?- Gynecomastia with spironolactone. ?- Echo (2/20): EF 20-25%, severe LV enlargement, moderate RV enlargement, moderately decreased RV systolic function.  ?- CPX (2/21): peak VO2 13.2, VE/VCO2 slope 24, RER 1.10. Severe functional limitation due to OHS with severe restrictive lung disease without significant HF limitation.  ?- PYP scan (7/21): Negative for TTR cardiac amyloidosis.  ?- Echo (1/22): EF < 20% with severe LV dilation, normal RV size with moderately decreased systolic function, dilated IVC.  ?- TEE (7/22): EF 20% with severe LV dilation, moderate RV enlargement with moderately decreased RV systolic function.  ?2. COPD: Uses 2L home oxygen. Never smoked, but apparently had significant occupational exposure.  It appears that he won a lawsuit dealing with the occupational exposure-related COPD.  ?3. CAD: Cath in 5/17 with 60% RCA stenosis.  ?- LHC (5/19): 80% distal LAD, 80% proximal RCA.  DES to proximal RCA.  ?4. Polycythemia: Probably due to chronic hypoxemia.  ?5. Type II diabetes.  ?6. HTN ?7. GERD ?8. CKD: Stage 3.  Likely due to diabetes and HTN.  ?9. OSA: Unable to tolerate CPAP.  ?10. PAD: Right and left BKA 2022.  ?11. 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 Group B Strep bacteremia ? ?Review of systems complete and found to be negative unless listed in HPI.   ? ?Current Outpatient Medications  ?Medication Sig Dispense Refill  ? albuterol (VENTOLIN HFA) 108 (90 Base) MCG/ACT inhaler Inhale 1-2 puffs into the lungs every 4 (four) hours as needed for wheezing or shortness of breath. 8 g 1  ? allopurinol (ZYLOPRIM) 100 MG tablet Take 1 tablet (100 mg total) by mouth daily. 30 tablet 3  ? aspirin 81 MG chewable tablet Chew 81 mg by mouth daily.    ? atorvastatin (LIPITOR) 40 MG tablet TAKE ONE TABLET BY MOUTH DAILY 90 tablet 3  ? bisoprolol (ZEBETA) 5 MG tablet Take 0.5 tablets (2.5 mg total) by mouth daily. 30 tablet 0  ? busPIRone (BUSPAR) 5 MG  tablet Take 1 tablet (5 mg total) by mouth 2 (two) times daily. 60 tablet 0  ? clopidogrel (PLAVIX) 75 MG tablet TAKE ONE TABLET BY MOUTH DAILY 30 tablet 11  ? digoxin (LANOXIN) 0.125 MG tablet Take 1 tablet (0.125 mg total) by mouth daily. 60 tablet 3  ? docusate sodium (COLACE) 100 MG capsule Take 1 capsule (100 mg total) by mouth daily. 10 capsule 0  ? DULoxetine (CYMBALTA) 60 MG capsule Take 1 capsule (60 mg total) by mouth daily. 30 capsule 2  ? empagliflozin (JARDIANCE) 10 MG TABS tablet Take 1 tablet (10 mg total) by mouth daily. 30 tablet 0  ? ENTRESTO 24-26 MG TAKE ONE TABLET BY MOUTH TWICE A DAY 60 tablet 0  ? eplerenone (INSPRA) 50 MG tablet TAKE ONE TABLET BY MOUTH DAILY 30 tablet 5  ? fenofibrate (TRICOR) 145 MG tablet TAKE ONE TABLET BY MOUTH  DAILY 90 tablet 3  ? Fluticasone Furoate-Vilanterol (BREO ELLIPTA IN) Inhale into the lungs daily as needed.    ? gabapentin (NEURONTIN) 100 MG capsule Take 2 capsules (200 mg total) by mouth 3 (three) times daily. 180 capsule 0  ? hydrALAZINE (APRESOLINE) 50 MG tablet Take 1.5 tablets (75 mg total) by mouth 3 (three) times daily. NEEDS APPOINTMENT FOR MORE REFILLS 540 tablet 0  ? MAGNESIUM-OXIDE 400 (241.3 Mg) MG tablet Take 1 tablet (400 mg total) by mouth daily. 30 tablet 11  ? melatonin 3 MG TABS tablet TAKE ONE TABLET BY MOUTH AT BEDTIME 30 tablet 0  ? Multiple Vitamin (MULTIVITAMIN WITH MINERALS) TABS tablet Take 1 tablet by mouth daily.    ? spironolactone (ALDACTONE) 25 MG tablet Take 0.5 tablets (12.5 mg total) by mouth daily. 30 tablet 0  ? zinc sulfate 220 (50 Zn) MG capsule Take 1 capsule (220 mg total) by mouth daily. 30 capsule 0  ? insulin glargine-yfgn (SEMGLEE, YFGN,) 100 UNIT/ML injection Inject 0.08 mLs (8 Units total) into the skin 2 (two) times daily. (Patient not taking: Reported on 09/09/2021) 10 mL 11  ? insulin regular (NOVOLIN R) 100 units/mL injection Inject into the skin as needed for high blood sugar. As per sliding scale (Patient not  taking: Reported on 09/22/2021)    ? isosorbide mononitrate (IMDUR) 30 MG 24 hr tablet Take 1 tablet (30 mg total) by mouth daily. 30 tablet 11  ? torsemide (DEMADEX) 20 MG tablet Take 1 tablet (20 mg to

## 2021-09-22 ENCOUNTER — Telehealth (HOSPITAL_COMMUNITY): Payer: Self-pay

## 2021-09-22 ENCOUNTER — Other Ambulatory Visit (HOSPITAL_COMMUNITY): Payer: Self-pay | Admitting: Cardiology

## 2021-09-22 ENCOUNTER — Ambulatory Visit (HOSPITAL_COMMUNITY)
Admission: RE | Admit: 2021-09-22 | Discharge: 2021-09-22 | Disposition: A | Discharge status: Home / Self Care | Payer: Medicare Other | Source: Ambulatory Visit | Attending: Family Medicine | Admitting: Family Medicine

## 2021-09-22 ENCOUNTER — Encounter (HOSPITAL_COMMUNITY): Payer: Self-pay

## 2021-09-22 ENCOUNTER — Ambulatory Visit (HOSPITAL_COMMUNITY)
Admission: RE | Admit: 2021-09-22 | Discharge: 2021-09-22 | Disposition: A | Discharge status: Home / Self Care | Payer: Medicare Other | Source: Ambulatory Visit | Attending: Cardiology | Admitting: Cardiology

## 2021-09-22 VITALS — BP 78/58 | HR 84 | Wt 231.0 lb

## 2021-09-22 DIAGNOSIS — I5023 Acute on chronic systolic (congestive) heart failure: Secondary | ICD-10-CM | POA: Diagnosis not present

## 2021-09-22 DIAGNOSIS — Z89512 Acquired absence of left leg below knee: Secondary | ICD-10-CM | POA: Insufficient documentation

## 2021-09-22 DIAGNOSIS — Z7982 Long term (current) use of aspirin: Secondary | ICD-10-CM | POA: Insufficient documentation

## 2021-09-22 DIAGNOSIS — J449 Chronic obstructive pulmonary disease, unspecified: Secondary | ICD-10-CM | POA: Diagnosis not present

## 2021-09-22 DIAGNOSIS — Z89511 Acquired absence of right leg below knee: Secondary | ICD-10-CM | POA: Diagnosis not present

## 2021-09-22 DIAGNOSIS — Z95810 Presence of automatic (implantable) cardiac defibrillator: Secondary | ICD-10-CM | POA: Diagnosis not present

## 2021-09-22 DIAGNOSIS — E781 Pure hyperglyceridemia: Secondary | ICD-10-CM | POA: Diagnosis not present

## 2021-09-22 DIAGNOSIS — E1122 Type 2 diabetes mellitus with diabetic chronic kidney disease: Secondary | ICD-10-CM | POA: Diagnosis not present

## 2021-09-22 DIAGNOSIS — Z955 Presence of coronary angioplasty implant and graft: Secondary | ICD-10-CM | POA: Diagnosis not present

## 2021-09-22 DIAGNOSIS — I428 Other cardiomyopathies: Secondary | ICD-10-CM | POA: Diagnosis not present

## 2021-09-22 DIAGNOSIS — Z833 Family history of diabetes mellitus: Secondary | ICD-10-CM | POA: Insufficient documentation

## 2021-09-22 DIAGNOSIS — Z794 Long term (current) use of insulin: Secondary | ICD-10-CM | POA: Diagnosis not present

## 2021-09-22 DIAGNOSIS — Z8249 Family history of ischemic heart disease and other diseases of the circulatory system: Secondary | ICD-10-CM | POA: Diagnosis not present

## 2021-09-22 DIAGNOSIS — D751 Secondary polycythemia: Secondary | ICD-10-CM | POA: Insufficient documentation

## 2021-09-22 DIAGNOSIS — I130 Hypertensive heart and chronic kidney disease with heart failure and stage 1 through stage 4 chronic kidney disease, or unspecified chronic kidney disease: Secondary | ICD-10-CM | POA: Insufficient documentation

## 2021-09-22 DIAGNOSIS — I493 Ventricular premature depolarization: Secondary | ICD-10-CM | POA: Diagnosis not present

## 2021-09-22 DIAGNOSIS — N183 Chronic kidney disease, stage 3 unspecified: Secondary | ICD-10-CM | POA: Diagnosis not present

## 2021-09-22 DIAGNOSIS — I5022 Chronic systolic (congestive) heart failure: Secondary | ICD-10-CM | POA: Diagnosis not present

## 2021-09-22 DIAGNOSIS — Z79899 Other long term (current) drug therapy: Secondary | ICD-10-CM | POA: Insufficient documentation

## 2021-09-22 DIAGNOSIS — G4733 Obstructive sleep apnea (adult) (pediatric): Secondary | ICD-10-CM | POA: Insufficient documentation

## 2021-09-22 DIAGNOSIS — I2510 Atherosclerotic heart disease of native coronary artery without angina pectoris: Secondary | ICD-10-CM | POA: Insufficient documentation

## 2021-09-22 LAB — COMPREHENSIVE METABOLIC PANEL
ALT: 17 U/L (ref 0–44)
AST: 20 U/L (ref 15–41)
Albumin: 3.5 g/dL (ref 3.5–5.0)
Alkaline Phosphatase: 40 U/L (ref 38–126)
Anion gap: 7 (ref 5–15)
BUN: 27 mg/dL — ABNORMAL HIGH (ref 6–20)
CO2: 33 mmol/L — ABNORMAL HIGH (ref 22–32)
Calcium: 8.7 mg/dL — ABNORMAL LOW (ref 8.9–10.3)
Chloride: 100 mmol/L (ref 98–111)
Creatinine, Ser: 1.66 mg/dL — ABNORMAL HIGH (ref 0.61–1.24)
GFR, Estimated: 47 mL/min — ABNORMAL LOW (ref >60)
Glucose, Bld: 228 mg/dL — ABNORMAL HIGH (ref 70–99)
Potassium: 4.6 mmol/L (ref 3.5–5.1)
Sodium: 140 mmol/L (ref 135–145)
Total Bilirubin: 0.7 mg/dL (ref 0.3–1.2)
Total Protein: 6.8 g/dL (ref 6.5–8.1)

## 2021-09-22 LAB — CBC
HCT: 53.1 % — ABNORMAL HIGH (ref 39.0–52.0)
Hemoglobin: 16.9 g/dL (ref 13.0–17.0)
MCH: 30.4 pg (ref 26.0–34.0)
MCHC: 31.8 g/dL (ref 30.0–36.0)
MCV: 95.5 fL (ref 80.0–100.0)
Platelets: 224 10*3/uL (ref 150–400)
RBC: 5.56 MIL/uL (ref 4.22–5.81)
RDW: 16.2 % — ABNORMAL HIGH (ref 11.5–15.5)
WBC: 5.5 10*3/uL (ref 4.0–10.5)
nRBC: 0 % (ref 0.0–0.2)

## 2021-09-22 LAB — MAGNESIUM: Magnesium: 2 mg/dL (ref 1.7–2.4)

## 2021-09-22 LAB — DIGOXIN LEVEL: Digoxin Level: <0.2 ng/mL — ABNORMAL LOW (ref 0.8–2.0)

## 2021-09-22 LAB — BRAIN NATRIURETIC PEPTIDE: B Natriuretic Peptide: 199.5 pg/mL — ABNORMAL HIGH (ref 0.0–100.0)

## 2021-09-22 MED ORDER — TORSEMIDE 20 MG PO TABS: 20.0000 mg | ORAL_TABLET | Freq: Two times a day (BID) | ORAL | 3 refills | Status: DC

## 2021-09-22 MED ORDER — ISOSORBIDE MONONITRATE ER 30 MG PO TB24: 30.0000 mg | ORAL_TABLET | Freq: Every day | ORAL | 11 refills | Status: DC

## 2021-09-22 NOTE — Telephone Encounter (Signed)
Spoke to [place] B who agreed to home paramedicine services restarting and home visit scheduled for Monday at 230. Call complete.  ?

## 2021-09-22 NOTE — Progress Notes (Signed)
Paramedicine Initial Assessment: ? ?Housing:  ?In what kind of housing do you live? House/apt/trailer/shelter? house ? ?Do you rent/pay a mortgage/own? rent ? ?Do you live with anyone? Wife, son, two daughters, two brother in laws, and grand daughter ? ?Are you currently worried about losing your housing? no ? ? ?Social:  ?What is your current marital status? married ? ?Do you have family or friends who live locally? Main support system is family that he is living with- reports no other family members in area that he considers part of his support system ? ?Income:  ?What is your current source of income? Disability income $1300 ? ? ?Insurance:  ?Are you currently insured? Medicare/medicaid ? ?Do you have prescription coverage? yes ? ? ?Transportation:  ?Do you have transportation to your medical appointments? Family works so no always available to help with transportation.  Discussed his eligibility to utilize Medicaid transport and instructed him on how to become registered to use. ? ? ? Daily Health Needs: ?Do you have a working scale at home? Yes- one previously supplied by paramedic. ? ?How do you manage your medications at home? Gets bubble packs but has been told he is taking medications incorrectly- believes packs are being filled wrong ? ?Do you ever take your medications differently than prescribed? Not intentionally ? ?Do you have issues affording your medications? no ? ?Do you have any concerns with mobility at home? Has wheelchair, walker, and prosthetics- finds it somewhat hard to get around due to his physical limitations and his height but generally manages- would like a toilet chair- pt will follow up with PCP regarding this. ? ?Do you use any assistive devices at home or have PCS at home? Has pending PCS services with PCP office. ? ?Do you have a PCP? Yes Elease Hashimoto- just saw them last week ? ?Are there any additional barriers you see to getting the care you need? no ? ?CSW will continue to follow  through paramedicine program and assist as needed. ? ?Burna Sis, LCSW ?Clinical Social Worker ?[facility] ?Desk#: [phone] ?Cell#: [phone] ? ?

## 2021-09-22 NOTE — Progress Notes (Incomplete)
?Heart and Vascular Care Navigation ? ?09/22/2021 ? ?Richard [name] ?August 17, 1960 ?[id number] ? ?Reason for Referral: Student Intern completed routine SDOH screening. ?  ?Engaged with patient face to face for initial visit for Heart and Vascular Care Coordination. ?                                                                                                  ?Assessment:  Student Intern completed SDOH screening with Pt. During assessment, Pt expressed concerns with transportation to medical appointments. Pts wife provides transportation when possible but works and is unable to provide regular transportation. Pt also requested to be re-enrolled in paramedicine program to assist him with his medications.              ? ?HRT/VAS Care Coordination   ? ? Patients Home Cardiology Office Heart Failure Clinic  ? Outpatient Care Team Community Paramedicine  ? Community Paramedic Name: [facility] 03/17/21 for lack of communication  ? Social Worker Name: Rosetta Posner- [facility]- [phone]  ? Living arrangements for the past 2 months Single Family Home  ? Lives with: Spouse; Relatives  ? Patient Current Insurance Coverage Medicaid; Traditional Medicare  ? Patient Has Concern With Paying Medical Bills No  ? Does Patient Have Prescription Coverage? Yes  ? Home Assistive Devices/Equipment CBG Meter; Dentures (specify type); Blood pressure cuff; Wheelchair; Shower chair with back  upper dentures  ? [facility]  ? Current home services DME  ? ?  ? ? ?Social History:                                                                             ?SDOH Screenings  ? ?Alcohol Screen: Not on file  ?Depression (PHQ2-9): Not on file  ?Financial Resource Strain: Low Risk   ? Difficulty of Paying Living Expenses: Not hard at all  ?Food Insecurity: No Food Insecurity  ? Worried About Programme researcher, broadcasting/film/video in the Last Year: Never true  ? Ran Out of Food in the Last Year: Never true  ?Housing: Low Risk   ?  Last Housing Risk Score: 0  ?Physical Activity: Not on file  ?Social Connections: Not on file  ?Stress: Not on file  ?Tobacco Use: Medium Risk  ? Smoking Tobacco Use: Former  ? Smokeless Tobacco Use: Never  ? Passive Exposure: Not on file  ?Transportation Needs: Unmet Transportation Needs  ? Lack of Transportation (Medical): Yes  ? Lack of Transportation (Non-Medical): No  ? ? ?SDOH Interventions: ?Financial Resources:    ?***  ?Food Insecurity:   N/A  ?Housing Insecurity:   N/A  ?Transportation:    Medicaid Transportation  ? ? ? ?Follow-up plan: Student Intern and LCSW will provide pt with [facility] number to call about enrollment  in [place] transportation assistance. LCSW will take further steps to enroll pt back into paramedicine program.  ? ?Johnnette Gourd, MSW student intern ?[facility] Clinic ?[place] ?[phone] ? ? ? ? ? ? ?

## 2021-09-22 NOTE — Patient Instructions (Signed)
HOLD Entresto tonight 09/22/2021 ?DECREASE Isosorbide 30 mg, one tab daily ?DECREASE Torsemide to 20 mg twice a day every other day ? ?Labs today ?We will only contact you if something comes back abnormal or we need to make some changes. ?Otherwise no news is good news! ? ?Your provider has recommended that  you wear a Zio Patch for 3 days.  This monitor will record your heart rhythm for our review.  IF you have any symptoms while wearing the monitor please press the button.  If you have any issues with the patch or you notice a red or orange light on it please call the company at [phone].  Once you remove the patch please mail it back to the company as soon as possible so we can get the results. ? ?Your physician recommends that you schedule a follow-up appointment in: 1 week  in the Advanced Practitioners (PA/NP) Clinic  ? ?Do the following things EVERYDAY: ?Weigh yourself in the morning before breakfast. Write it down and keep it in a log. ?Take your medicines as prescribed ?Eat low salt foods--Limit salt (sodium) to 2000 mg per day.  ?Stay as active as you can everyday ?Limit all fluids for the day to less than 2 liters ? ?At the [facility], you and your health needs are our priority. As part of our continuing mission to provide you with exceptional heart care, we have created designated Provider Care Teams. These Care Teams include your primary Cardiologist (physician) and Advanced Practice Providers (APPs- Physician Assistants and Nurse Practitioners) who all work together to provide you with the care you need, when you need it.  ? ?You may see any of the following providers on your designated Care Team at your next follow up: ?Dr Arvilla Meres ?Dr Marca Ancona ?Tonye Becket, NP ?Robbie Lis, PA ?Jessica Milford,NP ?Anna Genre, PA ?Karle Plumber, PharmD ? ? ?Please be sure to bring in all your medications bottles to every appointment.  ? ?If you have any questions or concerns  before your next appointment please send [place] a message through [place] or call our office at [phone].   ? ?TO LEAVE A MESSAGE FOR THE NURSE SELECT OPTION 2, PLEASE LEAVE A MESSAGE INCLUDING: ?YOUR NAME ?DATE OF BIRTH ?CALL BACK NUMBER ?REASON FOR CALL**this is important as we prioritize the call backs ? ?YOU WILL RECEIVE A CALL BACK THE SAME DAY AS LONG AS YOU CALL BEFORE 4:00 PM ? ? ? ?

## 2021-09-22 NOTE — Telephone Encounter (Addendum)
Pt aware, agreeable, and verbalized understanding ? ? ?----- Message from Allayne Butcher, PA-C sent at 09/22/2021  1:32 PM EDT ----- ?Lab show he is a little dry.  ? ?Hold [employer]. Can resume tomorrow.  ? ?Hold torsemide tomorrow then restart and reduce to QOD dosing. ? ?F/u w/ APP in 1 wk   ?

## 2021-09-26 ENCOUNTER — Other Ambulatory Visit (HOSPITAL_COMMUNITY): Payer: Self-pay

## 2021-09-26 NOTE — Progress Notes (Signed)
Paramedicine Encounter ? ? ? Patient ID: Richard [name], male    DOB: Oct 01, 1960, 61 y.o.   MRN: [id number] ? ?Arrived for home visit for Richard [name]. He is alert sitting in a chair at his kitchen table reporting to be feeling fine. He denied chest pain, dizziness or shortness of breath today. He has some abdominal distention which is normal for him. Lung sounds clear. Bilat. BKA no edema assessed.  ? ?Vitals and assessment as noted.  ? ?Meds reviewed and discrepancies as listed: ? ?-Busperone (unknown if he is supposed to be taking, does not have at home) ? ?-Jardiance has 25mg  in bubble packs but should be 10mg  (will confirm with HF clinic) ? ?-No Entresto in bubble packs (will have new RX sent in) ? ?- Isosorbide has 60mg  at home but is prescribed 30mg , will have new RX sent in. ? ?-Spironolactone 12.5mg  not in bubble packs, will have RX sent in.  ? ?-Inspra, patient is electing not to take as he feels this is making his heart stop in the middle of the night. I will forward this to HF staff.  ? ?All notes will be sent to HF triage CMA and Ellen Henri for review. ? ?Incorrect bubble packs dropped off with [employer] and advised Sam (pharmacist) to create bottles of all meds and that I will reach out once new/changes are made and sent in. He agreed.  ? ?I will follow up with [facility] for correct med list confirmation.  ? ? ?Zio patch mailed in for patient via [place].  ? ? ?Patient reminded of upcoming appointments.  ? ? ?Will follow up as soon as med changes are made. Home visit complete.  ? ?Patient Care Team: ?Rip Harbour as PCP - General (Internal Medicine) ?Larey Dresser, MD as PCP - Advanced Heart Failure (Cardiology) ?Deboraha Sprang, MD as PCP - Electrophysiology (Cardiology) ?Jorge Ny, LCSW as Education officer, museum (Licensed [employer] representative) ? ?Patient Active Problem List  ? Diagnosis Date Noted  ? Hyperlipidemia 09/09/2021  ? Wound dehiscence 03/02/2021  ? Subacute  osteomyelitis, right ankle and foot ([place])   ? Sleep disturbance   ? Essential hypertension   ? Diabetic peripheral neuropathy ([place])   ? Labile blood glucose   ? Left below-knee amputee ([place]) 02/07/2021  ? PAD (peripheral artery disease) ([place]) 02/01/2021  ? Below-knee amputation of left lower extremity ([place]) 01/27/2021  ? S/P BKA (below knee amputation), left ([place])   ? 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[place])   ? SOB (shortness of breath)   ? Open wound   ? Bacteremia   ? Chronic ulcer of left ankle ([place])   ? Severe sepsis without septic shock ([place])   ? COPD exacerbation ([place]) 01/15/2021  ? Chronic systolic CHF (congestive heart failure) ([place]) 01/15/2021  ? Acute on chronic respiratory failure with hypoxia and hypercapnia ([place]) 01/15/2021  ? Acute renal failure superimposed on stage 3 chronic kidney disease ([place]) 01/15/2021  ? Elevated troponin 01/15/2021  ? Hyponatremia 01/15/2021  ? CHF exacerbation ([place]) 10/17/2020  ? Acute on chronic systolic CHF (congestive heart failure) ([place]) 10/16/2020  ? Acute on chronic combined systolic and diastolic CHF (congestive heart failure) ([place]) 03/15/2018  ? S/P right and left heart catheterization 11/02/2017  ? Panic attacks 11/02/2017  ? Non-STEMI (non-ST elevated myocardial infarction) ([place])   ? COPD (chronic obstructive pulmonary disease) ([place])   ? Hypertension   ? CKD (chronic kidney disease)   ? Single  hamartoma of lung ([place])   ? Presence of implantable cardioverter-defibrillator (ICD)   ? GERD (gastroesophageal reflux disease)   ? Diabetes mellitus with diabetic neuropathy, with long-term current use of insulin ([place])   ? ? ?Current Outpatient Medications:  ?  albuterol (VENTOLIN HFA) 108 (90 Base) MCG/ACT inhaler, Inhale 1-2 puffs into the lungs every 4 (four) hours as needed for wheezing or shortness of breath., Disp: 8 g, Rfl: 1 ?  allopurinol (ZYLOPRIM) 100 MG tablet, Take 1 tablet (100 mg total) by mouth daily., Disp: 30 tablet, Rfl: 3 ?  aspirin 81 MG chewable tablet,  Chew 81 mg by mouth daily., Disp: , Rfl:  ?  atorvastatin (LIPITOR) 40 MG tablet, TAKE ONE TABLET BY MOUTH DAILY, Disp: 90 tablet, Rfl: 3 ?  bisoprolol (ZEBETA) 5 MG tablet, Take 0.5 tablets (2.5 mg total) by mouth daily., Disp: 30 tablet, Rfl: 0 ?  busPIRone (BUSPAR) 5 MG tablet, Take 1 tablet (5 mg total) by mouth 2 (two) times daily., Disp: 60 tablet, Rfl: 0 ?  clopidogrel (PLAVIX) 75 MG tablet, TAKE ONE TABLET BY MOUTH DAILY, Disp: 30 tablet, Rfl: 11 ?  digoxin (LANOXIN) 0.125 MG tablet, Take 1 tablet (0.125 mg total) by mouth daily., Disp: 60 tablet, Rfl: 3 ?  docusate sodium (COLACE) 100 MG capsule, Take 1 capsule (100 mg total) by mouth daily., Disp: 10 capsule, Rfl: 0 ?  DULoxetine (CYMBALTA) 60 MG capsule, Take 1 capsule (60 mg total) by mouth daily., Disp: 30 capsule, Rfl: 2 ?  empagliflozin (JARDIANCE) 10 MG TABS tablet, Take 1 tablet (10 mg total) by mouth daily., Disp: 30 tablet, Rfl: 0 ?  ENTRESTO 24-26 MG, TAKE ONE TABLET BY MOUTH TWICE A DAY, Disp: 60 tablet, Rfl: 0 ?  eplerenone (INSPRA) 50 MG tablet, TAKE ONE TABLET BY MOUTH DAILY, Disp: 30 tablet, Rfl: 5 ?  fenofibrate (TRICOR) 145 MG tablet, TAKE ONE TABLET BY MOUTH DAILY, Disp: 90 tablet, Rfl: 3 ?  Fluticasone Furoate-Vilanterol (BREO ELLIPTA IN), Inhale into the lungs daily as needed., Disp: , Rfl:  ?  gabapentin (NEURONTIN) 100 MG capsule, Take 2 capsules (200 mg total) by mouth 3 (three) times daily., Disp: 180 capsule, Rfl: 0 ?  hydrALAZINE (APRESOLINE) 50 MG tablet, Take 1.5 tablets (75 mg total) by mouth 3 (three) times daily. NEEDS APPOINTMENT FOR MORE REFILLS, Disp: 540 tablet, Rfl: 0 ?  insulin glargine-yfgn (SEMGLEE, YFGN,) 100 UNIT/ML injection, Inject 0.08 mLs (8 Units total) into the skin 2 (two) times daily. (Patient not taking: Reported on 09/09/2021), Disp: 10 mL, Rfl: 11 ?  insulin regular (NOVOLIN R) 100 units/mL injection, Inject into the skin as needed for high blood sugar. As per sliding scale (Patient not taking: Reported on  09/22/2021), Disp: , Rfl:  ?  isosorbide mononitrate (IMDUR) 30 MG 24 hr tablet, Take 1 tablet (30 mg total) by mouth daily., Disp: 30 tablet, Rfl: 11 ?  MAGNESIUM-OXIDE 400 (241.3 Mg) MG tablet, Take 1 tablet (400 mg total) by mouth daily., Disp: 30 tablet, Rfl: 11 ?  melatonin 3 MG TABS tablet, TAKE ONE TABLET BY MOUTH AT BEDTIME, Disp: 30 tablet, Rfl: 0 ?  Multiple Vitamin (MULTIVITAMIN WITH MINERALS) TABS tablet, Take 1 tablet by mouth daily., Disp: , Rfl:  ?  spironolactone (ALDACTONE) 25 MG tablet, Take 0.5 tablets (12.5 mg total) by mouth daily., Disp: 30 tablet, Rfl: 0 ?  torsemide (DEMADEX) 20 MG tablet, Take 1 tablet (20 mg total) by mouth 2 (two) times daily. Every other day,  Disp: 60 tablet, Rfl: 3 ?  zinc sulfate 220 (50 Zn) MG capsule, Take 1 capsule (220 mg total) by mouth daily., Disp: 30 capsule, Rfl: 0 ?Allergies  ?Allergen Reactions  ? Aspirin Itching  ? Codeine Hives  ? Coconut Oil Itching  ? ? ? ?Social History  ? ?Socioeconomic History  ? Marital status: Divorced  ?  Spouse name: Not on file  ? Number of children: Not on file  ? Years of education: Not on file  ? Highest education level: Not on file  ?Occupational History  ? Occupation: disability  ?  Comment: stopped working in 2000 d/t occupational exposures  ?Tobacco Use  ? Smoking status: Former  ?  Types: Cigarettes  ? Smokeless tobacco: Never  ? Tobacco comments:  ?  "smoked when I drank"  ?Vaping Use  ? Vaping Use: Never used  ?Substance and Sexual Activity  ? Alcohol use: Not Currently  ?  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 Drug use: Never  ? Sexual activity: Not Currently  ?Other Topics Concern  ? Not on file  ?Social History Narrative  ? Patient lives at home with wife and some of his 10 children. He self-administers his own medications.  ? ?Social Determinants of Health  ? ?Financial Resource Strain: Low Risk   ? Difficulty of Paying Living Expenses: Not hard at all  ?Food Insecurity: No Food Insecurity   ? Worried About Charity fundraiser in the Last Year: Never true  ? Ran Out of Food in the Last Year: Never true  ?Transportation Needs: Unmet Transportation Needs  ? Lack of Transportation (Medical): Yes  ?

## 2021-09-28 ENCOUNTER — Ambulatory Visit: Payer: Medicare Other | Attending: Physician Assistant | Admitting: Physical Therapy

## 2021-09-28 DIAGNOSIS — R2681 Unsteadiness on feet: Secondary | ICD-10-CM | POA: Insufficient documentation

## 2021-09-28 DIAGNOSIS — R2689 Other abnormalities of gait and mobility: Secondary | ICD-10-CM | POA: Insufficient documentation

## 2021-09-29 ENCOUNTER — Telehealth (HOSPITAL_COMMUNITY): Payer: Self-pay

## 2021-09-29 NOTE — Telephone Encounter (Signed)
Called to confirm/remind patient of their appointment at the [facility] on 09/30/21.  ? ?Patient stated he would call his brother for transportation. In addition, He was asked to call his local social services department due to his insurance coverage. ? ?Patient reminded to bring all medications and/or complete list. ? ?Confirmed patient has transportation. Gave directions, instructed to utilize valet parking. ? ?Confirmed appointment prior to ending call.  ? ?

## 2021-09-30 ENCOUNTER — Encounter (HOSPITAL_COMMUNITY): Payer: Medicare Other

## 2021-09-30 NOTE — Progress Notes (Incomplete)
? ? ?  ? ? ?Advanced Heart Failure Clinic Note  ? ?ID:  Richard [name], DOB Dec 26, 1960, MRN [id number]   ?Provider location: [place] [facility] ?Type of Visit: Established patient  ? ?PCP:  Robert Bellow, PA-C ?Cardiologist:  Dr. Aundra Dubin ?  ?History of Present Illness: ?Richard [name] is a 61 y.o. male who has a history of chronic systolic HF s/p Medtronic ICD (12/2016), HTN, DM, and HLD.  ? ?Admitted [id number] with A/C systolic HF. Echo showed EF 15-20%. Advanced HF team was consulted. He diuresed 20 lbs with IV lasix, then transitioned to torsemide 40 mg daily. Underwent R/LHC showing a focal severe RCA stenosis treated with DES.  He did not, however, appear to have severe enough coronary disease to explain his cardiomyopathy. Cardiac index was low at 1.9.  HF meds were optimized. Beta blocker was not started due to low CI and soft BPs. He was discharged on home oxygen. Referred to cardiac rehab. DC weight: 260 lbs.  ? ?Echo in 2/20 showed EF 20-25% with severe LV dilation, moderate RV enlargement with moderately decreased RV systolic function.  CPX in 2/21 showed a severe functional limitation but it actually appeared to be primarily due to pulmonary issues.  ? ?Echo in 1/22 showed EF < 20% with severe LV dilation, normal RV size with moderately decreased systolic function, dilated IVC.  ? ?He was admitted in 4/22 with CHF.  ? ?TEE in 7/22 showed EF 20% with severe LV dilation, moderate RV enlargement with moderately decreased RV systolic function.  ? 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 ? ?Recently seen by Dr. Aundra Dubin on 3/17 and complained of worsening exertional dyspnea, NYHA Class III w/ evidenice of volume overload on exam and by Optivol. This was after running out of his torsemide and digoxin. Med were refilled/resumed and he was instructed to increase torsemide to 80 mg daily x 2 days then 60 mg daily. Imdur was also increased to 60 mg daily for  better BP control.  ? ?He returns back today for volume assessment. Here w/ wife. BP markedly low 78/58 checked x 2. EKG shows NSR 84 bpm but pulse ox captured low pulse HR readings in the 40s initially w/ frequent PVCs on rhythm strip. He feels tired but denies any current dizziness. Mentating well. Conversant. No recent CP. Reports dark/black stools ~2 weeks ago that have since resolved. He says is supposed to get a colonoscopy soon. He also reports decreased appetite and ~30 lb wt loss since his leg amputations. Walks w/ prosthetics. SOB w/ activity, NYHA Class II-III. Denies orthopnea/PND. Wt is down 8 lb since previous visit.  ? ?Device interrogation not c/w volume overload. Index below threshold. Impedence ok. Brief AT/AF in the last several weeks <10 min in duration. No VT/VF. Activity level ~1 hr/day.  ? ?He says he took "about half" of his BP meds this morning but unable to tell me which ones he took. He is certain that he did not take Entresto yet. He is no longer followed by paramedicine. Wife requesting that he be re-enrolled.  ? ? ? ?Medtronic device interrogation: Index below threshold. Impedence ok. Brief AT/AF in the last several weeks <10 min in duration. No VT/VF. Activity level ~1 hr/day.  ? ?Labs (10/19): K 3.9, creatinine 1.18  ?Labs (10/20): BNP 160, K 3.8, creatinine 1.31 ?Labs (4/21): TGs 475, LDL 67, K 4.2, creatinine 1.8, urine and serum immunofixations negative.  ?Labs (11/21): LDL  42, HDL 27, K 4.3, creatinine 1.81 ?Labs (5/22): K 4.5, creatinine 1.46 ?Labs (11/19/20): SCr 2.0, 4.5  ?Labs (11/22): K 4.6, creatinine 1.15 ?Labs (3/22): K 4.3, creatinine 1.46. LDL 42, TG 68  ? ?PMH: ?1. Chronic systolic CHF: Primarily nonischemic cardiomyopathy.  Has Medtronic ICD.   ?- Echo 5/19: EF 15-20%, grade 2 DD, mild MR, RV severely reduced, LA moderately dilated, RA mildly dilated, mild TR.  ?- RHC/LHC (5/19): 80% distal LAD stenosis, 80% proximal RCA stenosis treated with DES to pRCA. Mean RA 13, PA  69/31 mean 47, mean 24, CI 1.91, PVR 4.8 WU.  ?- Gynecomastia with spironolactone. ?- Echo (2/20): EF 20-25%, severe LV enlargement, moderate RV enlargement, moderately decreased RV systolic function.  ?- CPX (2/21): peak VO2 13.2, VE/VCO2 slope 24, RER 1.10. Severe functional limitation due to OHS with severe restrictive lung disease without significant HF limitation.  ?- PYP scan (7/21): Negative for TTR cardiac amyloidosis.  ?- Echo (1/22): EF < 20% with severe LV dilation, normal RV size with moderately decreased systolic function, dilated IVC.  ?- TEE (7/22): EF 20% with severe LV dilation, moderate RV enlargement with moderately decreased RV systolic function.  ?2. COPD: Uses 2L home oxygen. Never smoked, but apparently had significant occupational exposure.  It appears that he won a lawsuit dealing with the occupational exposure-related COPD.  ?3. CAD: Cath in 5/17 with 60% RCA stenosis.  ?- LHC (5/19): 80% distal LAD, 80% proximal RCA.  DES to proximal RCA.  ?4. Polycythemia: Probably due to chronic hypoxemia.  ?5. Type II diabetes.  ?6. HTN ?7. GERD ?8. CKD: Stage 3.  Likely due to diabetes and HTN.  ?9. OSA: Unable to tolerate CPAP.  ?10. PAD: Right and left BKA 2022.  ?11. 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 Group B Strep bacteremia ? ?Review of systems complete and found to be negative unless listed in HPI.   ? ?Current Outpatient Medications  ?Medication Sig Dispense Refill  ? albuterol (VENTOLIN HFA) 108 (90 Base) MCG/ACT inhaler Inhale 1-2 puffs into the lungs every 4 (four) hours as needed for wheezing or shortness of breath. 8 g 1  ? allopurinol (ZYLOPRIM) 100 MG tablet Take 1 tablet (100 mg total) by mouth daily. 30 tablet 3  ? aspirin 81 MG chewable tablet Chew 81 mg by mouth daily.    ? atorvastatin (LIPITOR) 40 MG tablet TAKE ONE TABLET BY MOUTH DAILY 90 tablet 3  ? bisoprolol (ZEBETA) 5 MG tablet Take 0.5 tablets (2.5 mg total) by mouth daily. 30 tablet 0  ? busPIRone (BUSPAR) 5 MG  tablet Take 1 tablet (5 mg total) by mouth 2 (two) times daily. (Patient not taking: Reported on 09/26/2021) 60 tablet 0  ? clopidogrel (PLAVIX) 75 MG tablet TAKE ONE TABLET BY MOUTH DAILY 30 tablet 11  ? digoxin (LANOXIN) 0.125 MG tablet Take 1 tablet (0.125 mg total) by mouth daily. 60 tablet 3  ? docusate sodium (COLACE) 100 MG capsule Take 1 capsule (100 mg total) by mouth daily. 10 capsule 0  ? DULoxetine (CYMBALTA) 60 MG capsule Take 1 capsule (60 mg total) by mouth daily. 30 capsule 2  ? empagliflozin (JARDIANCE) 10 MG TABS tablet Take 1 tablet (10 mg total) by mouth daily. 30 tablet 0  ? ENTRESTO 24-26 MG TAKE ONE TABLET BY MOUTH TWICE A DAY (Patient not taking: Reported on 09/26/2021) 60 tablet 0  ? eplerenone (INSPRA) 50 MG tablet TAKE ONE TABLET BY MOUTH DAILY (Patient not taking:  Reported on 09/26/2021) 30 tablet 5  ? fenofibrate (TRICOR) 145 MG tablet TAKE ONE TABLET BY MOUTH DAILY 90 tablet 3  ? Fluticasone Furoate-Vilanterol (BREO ELLIPTA IN) Inhale into the lungs daily as needed.    ? gabapentin (NEURONTIN) 100 MG capsule Take 2 capsules (200 mg total) by mouth 3 (three) times daily. 180 capsule 0  ? hydrALAZINE (APRESOLINE) 50 MG tablet Take 1.5 tablets (75 mg total) by mouth 3 (three) times daily. NEEDS APPOINTMENT FOR MORE REFILLS 540 tablet 0  ? insulin glargine-yfgn (SEMGLEE, YFGN,) 100 UNIT/ML injection Inject 0.08 mLs (8 Units total) into the skin 2 (two) times daily. (Patient not taking: Reported on 09/09/2021) 10 mL 11  ? insulin regular (NOVOLIN R) 100 units/mL injection Inject into the skin as needed for high blood sugar. As per sliding scale (Patient not taking: Reported on 09/22/2021)    ? insulin regular human CONCENTRATED (HUMULIN R U-500 KWIKPEN) 500 UNIT/ML KwikPen Inject into the skin.    ? isosorbide mononitrate (IMDUR) 30 MG 24 hr tablet Take 1 tablet (30 mg total) by mouth daily. 30 tablet 11  ? MAGNESIUM-OXIDE 400 (241.3 Mg) MG tablet Take 1 tablet (400 mg total) by mouth daily. 30  tablet 11  ? melatonin 3 MG TABS tablet TAKE ONE TABLET BY MOUTH AT BEDTIME (Patient not taking: Reported on 09/26/2021) 30 tablet 0  ? Multiple Vitamin (MULTIVITAMIN WITH MINERALS) TABS tablet Take 1 tablet

## 2021-10-03 ENCOUNTER — Other Ambulatory Visit (HOSPITAL_COMMUNITY): Payer: Self-pay

## 2021-10-03 ENCOUNTER — Other Ambulatory Visit (HOSPITAL_COMMUNITY): Payer: Self-pay | Admitting: *Deleted

## 2021-10-03 ENCOUNTER — Telehealth (HOSPITAL_COMMUNITY): Payer: Self-pay

## 2021-10-03 MED ORDER — SPIRONOLACTONE 25 MG PO TABS: 12.5000 mg | ORAL_TABLET | Freq: Every day | ORAL | 0 refills | Status: DC

## 2021-10-03 MED ORDER — ENTRESTO 24-26 MG PO TABS: 1.0000 | ORAL_TABLET | Freq: Two times a day (BID) | ORAL | 0 refills | Status: DC

## 2021-10-03 NOTE — Progress Notes (Signed)
Paramedicine Encounter ? ? ? Patient ID: Richard [name], male    DOB: 22-Apr-1961, 61 y.o.   MRN: [id number] ? ? ?Arrived for home visit for [place] who reports feeling fine denying shortness of breath, dizziness or chest pain. He reports feeling anxious sometimes. I advised him to reach out to his PCP about this. He agreed with plan. Vitals were obtained and are as noted in report.  ? ?CBG at breakfast was 205  ? ?He has not been weighing as he does not have a scale. I will ask HF clinic if we can provide one.  ? ?His HF clinic appointment was a no show last week due to lack of transportation. I advised him he has [place] medicaid and can use their services for transportation. He understood. We rescheduled HF clinic appointment for 4/25 at 330.  ? ?I reviewed all medications and confirmed same. I filled pill box for one week for Yacob.  ? ?All other appointments confirmed. He agreed with home visit in one week.  ? ?Refills:(will need PCP to send in) ?Albuterol  ?Allupurinol ?Cymbalta  ? ?Patient Care Team: ?Rip Harbour as PCP - General (Internal Medicine) ?Larey Dresser, MD as PCP - Advanced Heart Failure (Cardiology) ?Deboraha Sprang, MD as PCP - Electrophysiology (Cardiology) ?Jorge Ny, LCSW as Education officer, museum (Licensed [employer] representative) ? ?Patient Active Problem List  ? Diagnosis Date Noted  ? Hyperlipidemia 09/09/2021  ? Wound dehiscence 03/02/2021  ? Subacute osteomyelitis, right ankle and foot ([place])   ? Sleep disturbance   ? Essential hypertension   ? Diabetic peripheral neuropathy ([place])   ? Labile blood glucose   ? Left below-knee amputee ([place]) 02/07/2021  ? PAD (peripheral artery disease) ([place]) 02/01/2021  ? Below-knee amputation of left lower extremity ([place]) 01/27/2021  ? S/P BKA (below knee amputation), left ([place])   ? 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[place])   ? SOB (shortness of breath)   ? Open wound   ? Bacteremia   ? Chronic ulcer of left ankle ([place])   ? Severe sepsis without septic shock  ([place])   ? COPD exacerbation ([place]) 01/15/2021  ? Chronic systolic CHF (congestive heart failure) ([place]) 01/15/2021  ? Acute on chronic respiratory failure with hypoxia and hypercapnia ([place]) 01/15/2021  ? Acute renal failure superimposed on stage 3 chronic kidney disease ([place]) 01/15/2021  ? Elevated troponin 01/15/2021  ? Hyponatremia 01/15/2021  ? CHF exacerbation ([place]) 10/17/2020  ? Acute on chronic systolic CHF (congestive heart failure) ([place]) 10/16/2020  ? Acute on chronic combined systolic and diastolic CHF (congestive heart failure) ([place]) 03/15/2018  ? S/P right and left heart catheterization 11/02/2017  ? Panic attacks 11/02/2017  ? Non-STEMI (non-ST elevated myocardial infarction) ([place])   ? COPD (chronic obstructive pulmonary disease) ([place])   ? Hypertension   ? CKD (chronic kidney disease)   ? 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[place])   ? Presence of implantable cardioverter-defibrillator (ICD)   ? GERD (gastroesophageal reflux disease)   ? Diabetes mellitus with diabetic neuropathy, with long-term current use of insulin ([place])   ? ? ?Current Outpatient Medications:  ?  albuterol (VENTOLIN HFA) 108 (90 Base) MCG/ACT inhaler, Inhale 1-2 puffs into the lungs every 4 (four) hours as needed for wheezing or shortness of breath., Disp: 8 g, Rfl: 1 ?  allopurinol (ZYLOPRIM) 100 MG tablet, Take 1 tablet (100 mg total) by mouth daily., Disp: 30 tablet, Rfl: 3 ?  aspirin 81 MG chewable tablet, Chew  81 mg by mouth daily., Disp: , Rfl:  ?  atorvastatin (LIPITOR) 40 MG tablet, TAKE ONE TABLET BY MOUTH DAILY, Disp: 90 tablet, Rfl: 3 ?  bisoprolol (ZEBETA) 5 MG tablet, Take 0.5 tablets (2.5 mg total) by mouth daily., Disp: 30 tablet, Rfl: 0 ?  busPIRone (BUSPAR) 5 MG tablet, Take 1 tablet (5 mg total) by mouth 2 (two) times daily. (Patient not taking: Reported on 09/26/2021), Disp: 60 tablet, Rfl: 0 ?  clopidogrel (PLAVIX) 75 MG tablet, TAKE ONE TABLET BY MOUTH DAILY, Disp: 30 tablet, Rfl: 11 ?  digoxin (LANOXIN) 0.125 MG tablet, Take 1  tablet (0.125 mg total) by mouth daily., Disp: 60 tablet, Rfl: 3 ?  docusate sodium (COLACE) 100 MG capsule, Take 1 capsule (100 mg total) by mouth daily., Disp: 10 capsule, Rfl: 0 ?  DULoxetine (CYMBALTA) 60 MG capsule, Take 1 capsule (60 mg total) by mouth daily., Disp: 30 capsule, Rfl: 2 ?  empagliflozin (JARDIANCE) 10 MG TABS tablet, Take 1 tablet (10 mg total) by mouth daily., Disp: 30 tablet, Rfl: 0 ?  fenofibrate (TRICOR) 145 MG tablet, TAKE ONE TABLET BY MOUTH DAILY, Disp: 90 tablet, Rfl: 3 ?  Fluticasone Furoate-Vilanterol (BREO ELLIPTA IN), Inhale into the lungs daily as needed., Disp: , Rfl:  ?  gabapentin (NEURONTIN) 100 MG capsule, Take 2 capsules (200 mg total) by mouth 3 (three) times daily., Disp: 180 capsule, Rfl: 0 ?  hydrALAZINE (APRESOLINE) 50 MG tablet, Take 1.5 tablets (75 mg total) by mouth 3 (three) times daily. NEEDS APPOINTMENT FOR MORE REFILLS, Disp: 540 tablet, Rfl: 0 ?  insulin glargine-yfgn (SEMGLEE, YFGN,) 100 UNIT/ML injection, Inject 0.08 mLs (8 Units total) into the skin 2 (two) times daily. (Patient not taking: Reported on 09/09/2021), Disp: 10 mL, Rfl: 11 ?  insulin regular (NOVOLIN R) 100 units/mL injection, Inject into the skin as needed for high blood sugar. As per sliding scale (Patient not taking: Reported on 09/22/2021), Disp: , Rfl:  ?  insulin regular human CONCENTRATED (HUMULIN R U-500 KWIKPEN) 500 UNIT/ML KwikPen, Inject into the skin., Disp: , Rfl:  ?  isosorbide mononitrate (IMDUR) 30 MG 24 hr tablet, Take 1 tablet (30 mg total) by mouth daily., Disp: 30 tablet, Rfl: 11 ?  MAGNESIUM-OXIDE 400 (241.3 Mg) MG tablet, Take 1 tablet (400 mg total) by mouth daily., Disp: 30 tablet, Rfl: 11 ?  melatonin 3 MG TABS tablet, TAKE ONE TABLET BY MOUTH AT BEDTIME (Patient not taking: Reported on 09/26/2021), Disp: 30 tablet, Rfl: 0 ?  Multiple Vitamin (MULTIVITAMIN WITH MINERALS) TABS tablet, Take 1 tablet by mouth daily. (Patient not taking: Reported on 09/26/2021), Disp: , Rfl:  ?   sacubitril-valsartan (ENTRESTO) 24-26 MG, Take 1 tablet by mouth 2 (two) times daily., Disp: 60 tablet, Rfl: 0 ?  spironolactone (ALDACTONE) 25 MG tablet, Take 0.5 tablets (12.5 mg total) by mouth daily., Disp: 30 tablet, Rfl: 0 ?  torsemide (DEMADEX) 20 MG tablet, Take 1 tablet (20 mg total) by mouth 2 (two) times daily. Every other day, Disp: 60 tablet, Rfl: 3 ?  zinc sulfate 220 (50 Zn) MG capsule, Take 1 capsule (220 mg total) by mouth daily. (Patient not taking: Reported on 09/26/2021), Disp: 30 capsule, Rfl: 0 ?Allergies  ?Allergen Reactions  ? Aspirin Itching  ? Codeine Hives  ? Coconut Oil Itching  ? ? ? ?Social History  ? ?Socioeconomic History  ? Marital status: Divorced  ?  Spouse name: Not on file  ? Number of children: Not on  file  ? Years of education: Not on file  ? Highest education level: Not on file  ?Occupational History  ? Occupation: disability  ?  Comment: stopped working in 2000 d/t occupational exposures  ?Tobacco Use  ? Smoking status: Former  ?  Types: Cigarettes  ? Smokeless tobacco: Never  ? Tobacco comments:  ?  "smoked when I drank"  ?Vaping Use  ? Vaping Use: Never used  ?Substance and Sexual Activity  ? Alcohol use: Not Currently  ?  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 Drug use: Never  ? Sexual activity: Not Currently  ?Other Topics Concern  ? Not on file  ?Social History Narrative  ? Patient lives at home with wife and some of his 10 children. He self-administers his own medications.  ? ?Social Determinants of Health  ? ?Financial Resource Strain: Low Risk   ? Difficulty of Paying Living Expenses: Not hard at all  ?Food Insecurity: No Food Insecurity  ? Worried About Charity fundraiser in the Last Year: Never true  ? Ran Out of Food in the Last Year: Never true  ?Transportation Needs: Unmet Transportation Needs  ? Lack of Transportation (Medical): Yes  ? Lack of Transportation (Non-Medical): Yes  ?Physical Activity: Not on file  ?Stress: Not on file   ?Social Connections: Not on file  ?Intimate Partner Violence: Not on file  ? ? ?Physical Exam ?Vitals reviewed.  ?Constitutional:   ?   Appearance: Normal appearance. He is normal weight.  ?HENT:  ?   Head: Nor

## 2021-10-03 NOTE — Telephone Encounter (Signed)
Spoke to [facility] and confirmed medication list and they confirmed they will have them ready today by 12:00. I will pick up and see him in the home today.  ?

## 2021-10-05 ENCOUNTER — Telehealth (HOSPITAL_COMMUNITY): Payer: Self-pay | Admitting: Licensed Clinical Social Worker

## 2021-10-05 NOTE — Telephone Encounter (Addendum)
HF Paramedicine Team Based Care Meeting ? ?HF MD- NA  ?HF NP - Amy Clegg NP-C  ? ?[facility] HF Paramedicine  ?Kerry Hough ?Geraldine Solar  ?Rosetta Posner ? ?Hospital admit within the last 30 days for heart failure? no ? ?Medications concerns? Has had a lot of medication changes.  Was set up on bubble packs but they were incorrect- working on fixing. ? ?Transportation issues ? Referred back to using Medicaid transport. ? ?Education needs? Meds, diet, and transportation ? ?SDOH -none currently ? ?Eligible for discharge?  Not at this time- continues to need assistance. ? ? ?Burna Sis, LCSW ?Clinical Social Worker ?[facility] ?Desk#: [phone] ?Cell#: [phone] ? ?

## 2021-10-10 NOTE — Addendum Note (Signed)
Encounter addended by: Crissie Figures, RN on: 10/10/2021 9:24 AM ? Actions taken: Imaging Exam ended

## 2021-10-10 NOTE — Addendum Note (Signed)
Encounter addended by: Crissie Figures, RN on: 10/10/2021 9:25 AM ? Actions taken: Imaging Exam ended

## 2021-10-11 ENCOUNTER — Other Ambulatory Visit: Payer: Self-pay | Admitting: Cardiology

## 2021-10-12 ENCOUNTER — Ambulatory Visit: Payer: Medicare Other

## 2021-10-12 DIAGNOSIS — R2681 Unsteadiness on feet: Secondary | ICD-10-CM

## 2021-10-12 DIAGNOSIS — R2689 Other abnormalities of gait and mobility: Secondary | ICD-10-CM

## 2021-10-12 NOTE — Therapy (Signed)
?OUTPATIENT PHYSICAL THERAPY TREATMENT NOTE/Discharge Summary ? ? ?Patient Name: Richard [name] ?MRN: [id number] ?DOB:30-Jul-1960, 61 y.o., male ?Today's Date: 10/13/2021 ? ?PCP: Robert Bellow, PA-C ?REFERRING PROVIDER: Robert Bellow, PA-C ? ? PHYSICAL THERAPY DISCHARGE SUMMARY ? ?Visits from Start of Care: 14 ? ?Current functional level related to goals / functional outcomes: ?See clinical impression and goals for more information. ?  ?Remaining deficits: ?Decreased activity tolerance, impaired balance. ?  ?Education / Equipment: ?HEP, recommending RW still  ? ?Patient agrees to discharge. Patient goals were partially met. Patient is being discharged due to  meeting most of goals, limitations due to comorbidities and family having a lot going on. ? ? ? ? PT End of Session - 10/12/21 1543   ? ? Visit Number 14   ? Number of Visits 17   ? Date for PT Re-Evaluation 10/21/21   ? Authorization Type MC/MCD   ? Progress Note Due on Visit 20   ? PT Start Time 1540   ? PT Stop Time 1620   ? PT Time Calculation (min) 40 min   ? Equipment Utilized During Treatment Gait belt   ? Activity Tolerance Patient tolerated treatment well   ? Behavior During Therapy [facility] for tasks assessed/performed   ? ?  ?  ? ?  ? ? ?Past Medical History:  ?Diagnosis Date  ? AICD (automatic cardioverter/defibrillator) present 01/11/2017  ? Anxiety   ? CHF (congestive heart failure) ([place])   ? CKD (chronic kidney disease)   ? COPD (chronic obstructive pulmonary disease) ([place])   ? never smoker, industrial exposure  ? Diabetes mellitus with diabetic neuropathy, with long-term current use of insulin ([place])   ? GERD (gastroesophageal reflux disease)   ? High cholesterol   ? History of gout   ? "take RX qd" (11/01/2017)  ? Hypertension   ? Nonobstructive atherosclerosis of coronary artery   ? On home oxygen therapy   ? "2L prn" (11/01/2017)  ? 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[place])   ? LLL, present for years  ? Systolic HF (heart failure) ([place])   ? ?Past Surgical  History:  ?Procedure Laterality Date  ? ABDOMINAL AORTOGRAM W/LOWER EXTREMITY N/A 02/01/2021  ? Procedure: ABDOMINAL AORTOGRAM W/LOWER EXTREMITY;  Surgeon: Serafina Mitchell, MD;  Location: [place] [facility];  Service: Cardiovascular;  Laterality: N/A;  ? AMPUTATION Left 01/21/2021  ? Procedure: AMPUTATION BELOW KNEE;  Surgeon: Newt Minion, MD;  Location: [place];  Service: Orthopedics;  Laterality: Left;  ? AMPUTATION Right 02/04/2021  ? Procedure: RIGHT FOOT 1ST AND 2ND RAY AMPUTATION;  Surgeon: Newt Minion, MD;  Location: [place];  Service: Orthopedics;  Laterality: Right;  ? AMPUTATION Right 03/02/2021  ? Procedure: RIGHT BELOW KNEE AMPUTATION;  Surgeon: Newt Minion, MD;  Location: [place];  Service: Orthopedics;  Laterality: Right;  ? APPLICATION OF WOUND VAC Left 01/21/2021  ? Procedure: APPLICATION OF WOUND VAC;  Surgeon: Newt Minion, MD;  Location: [place];  Service: Orthopedics;  Laterality: Left;  ? APPLICATION OF WOUND VAC  02/04/2021  ? Procedure: APPLICATION OF WOUND VAC;  Surgeon: Newt Minion, MD;  Location: [place];  Service: Orthopedics;;  ? APPLICATION OF WOUND VAC  03/02/2021  ? Procedure: APPLICATION OF WOUND VAC;  Surgeon: Newt Minion, MD;  Location: [place];  Service: Orthopedics;;  ? CATARACT EXTRACTION W/ INTRAOCULAR LENS IMPLANTW/ TRABECULECTOMY Left ~ 2013  ? "may have done this when I had my other eye OR"  ?  CIRCUMCISION    ? CORONARY STENT INTERVENTION N/A 11/02/2017  ? Procedure: CORONARY STENT INTERVENTION;  Surgeon: Larey Dresser, MD;  Location: [place] [facility];  Service: Cardiovascular;  Laterality: N/A;  ? CORONARY STENT INTERVENTION N/A 11/02/2017  ? Procedure: CORONARY STENT INTERVENTION;  Surgeon: Belva Crome, MD;  Location: [place] [facility];  Service: Cardiovascular;  Laterality: N/A;  ? EYE SURGERY Left ~ 2013  ? "fell on something in basement; stuck in my eye"  ? ICD IMPLANT    ? Medtronic Dual Chamber 01/11/17  ? MULTIPLE TOOTH EXTRACTIONS    ? "pulled all my top teeth"  ?  PERIPHERAL VASCULAR BALLOON ANGIOPLASTY  02/01/2021  ? Procedure: PERIPHERAL VASCULAR BALLOON ANGIOPLASTY;  Surgeon: Serafina Mitchell, MD;  Location: [place] [facility];  Service: Cardiovascular;;  TP Trunk / PT  ? RIGHT/LEFT HEART CATH AND CORONARY ANGIOGRAPHY N/A 11/02/2017  ? Procedure: RIGHT/LEFT HEART CATH AND CORONARY ANGIOGRAPHY;  Surgeon: Larey Dresser, MD;  Location: [place] [facility];  Service: Cardiovascular;  Laterality: N/A;  ? TEE WITHOUT CARDIOVERSION N/A 01/20/2021  ? Procedure: TRANSESOPHAGEAL ECHOCARDIOGRAM (TEE);  Surgeon: Larey Dresser, MD;  Location: [facility];  Service: Cardiovascular;  Laterality: N/A;  ? ?Patient Active Problem List  ? Diagnosis Date Noted  ? Hyperlipidemia 09/09/2021  ? Wound dehiscence 03/02/2021  ? Subacute osteomyelitis, right ankle and foot ([place])   ? Sleep disturbance   ? Essential hypertension   ? Diabetic peripheral neuropathy ([place])   ? Labile blood glucose   ? Left below-knee amputee ([place]) 02/07/2021  ? PAD (peripheral artery disease) ([place]) 02/01/2021  ? Below-knee amputation of left lower extremity ([place]) 01/27/2021  ? S/P BKA (below knee amputation), left ([place])   ? 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[place])   ? SOB (shortness of breath)   ? Open wound   ? Bacteremia   ? Chronic ulcer of left ankle ([place])   ? Severe sepsis without septic shock ([place])   ? COPD exacerbation ([place]) 01/15/2021  ? Chronic systolic CHF (congestive heart failure) ([place]) 01/15/2021  ? Acute on chronic respiratory failure with hypoxia and hypercapnia ([place]) 01/15/2021  ? Acute renal failure superimposed on stage 3 chronic kidney disease ([place]) 01/15/2021  ? Elevated troponin 01/15/2021  ? Hyponatremia 01/15/2021  ? CHF exacerbation ([place]) 10/17/2020  ? Acute on chronic systolic CHF (congestive heart failure) ([place]) 10/16/2020  ? Acute on chronic combined systolic and diastolic CHF (congestive heart failure) ([place]) 03/15/2018  ? S/P right and left heart catheterization 11/02/2017  ? Panic attacks  11/02/2017  ? Non-STEMI (non-ST elevated myocardial infarction) ([place])   ? COPD (chronic obstructive pulmonary disease) ([place])   ? Hypertension   ? CKD (chronic kidney disease)   ? 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[place])   ? Presence of implantable cardioverter-defibrillator (ICD)   ? GERD (gastroesophageal reflux disease)   ? Diabetes mellitus with diabetic neuropathy, with long-term current use of insulin ([place])   ? ? ?REFERRING DIAG: Bilateral BKA ? ?THERAPY DIAG:  ?Other abnormalities of gait and mobility ? ?Unsteadiness on feet ? ?PERTINENT HISTORY: CHF, COPD, DM, HTN,  ? ?PRECAUTIONS: Fall, 2L O2 prn, AICD present ? ?SUBJECTIVE: Pt reports that he has seen HF clinic and they were trying to get his meds straightened out. He was having a lot of SOB. Reports he has been walking short distances in home. Does not use walker at home. Reports 1 fall last week going up stairs when shoe twisted  around. Denies injury. ? ?PAIN:  ?Are you having pain? No ? ?Vitals: O2 sat=95%,  HR=74 ? ? ?TODAY'S TREATMENT:  ?10/12/21: ?Prosthetics: ?Prosthetic care comments: Pt reports that he is taking legs off about 2x/day to wipe off sweat. 5 ply sock on right and 3 ply on left. Had pt move 3 ply outside liner and only leave vivewear under liner. Discussed getting in with prosthetist so they can look at cutting down top edge to not hurt back of pt's legs when goes to sit. ?Donning prosthesis: Modified independence ?Doffing prosthesis: Modified independence ?Prosthetic wear tolerance: all awake hours hours/day, 7 days/week ?Prosthetic weight bearing tolerance:  ?Residual limb condition: pt reports skin intact  ? ? ? ?BP=130/82 after [place]. O2 sat=94%. ? ? ?STAIRS: ? Level of Assistance: Modified independence ? Stair Negotiation Technique: Alternating Pattern  with Bilateral Rails ? Number of Stairs: 12  ? Height of Stairs: 6  ?Comments:  ? ?GAIT: ?Gait pattern: step through pattern ?Distance walked: 78' ?Assistive device utilized: Environmental consultant - 2  wheeled, bilateral prosthesis, and None ?Level of assistance: Modified independence and CGA ?Comments: 1 lap with RW mod I and 2 without AD CGA/SBA. Pt had increased sway and fatigued quicker without device. PT recom

## 2021-10-13 ENCOUNTER — Other Ambulatory Visit (HOSPITAL_COMMUNITY): Payer: Self-pay | Admitting: Cardiology

## 2021-10-13 ENCOUNTER — Encounter (HOSPITAL_COMMUNITY): Payer: Self-pay | Admitting: Emergency Medicine

## 2021-10-13 ENCOUNTER — Other Ambulatory Visit (HOSPITAL_COMMUNITY): Payer: Self-pay | Admitting: Emergency Medicine

## 2021-10-13 NOTE — Progress Notes (Signed)
? ?Paramedicine Encounter ? ? ? Patient ID: Richard [name], male    DOB: [birth year], 61 y.o.   MRN: [id number] ? ?Arrived to find Richard [name] CA&O x 4, skin warm and dry sitting in his wheel chair.  Pt had just gotten up as I arrived.  No breakfast or meds yet this morning. Pt denies C/P or shortness of breath.  No edema noted to his lower extremities.  Pt states, "I can tell when I have fluid because my prosthetic legs won't hardly fit."  But today his prosthetics fit fine and he has no complaint.  Lungs sounds clear and equal bilat.  He stated he "graduated" from his physical therapy yesterday for walking with his prosthetics.  Reviewed medications with pt set up pill box for the week.  Pt found to be out of Allopurinol and I contacted pt's PCP for refills on same.  Also contacted [employer]  who advised they will be able to deliver them to Richard [name].  He advises he will be able to place 1 pill per morning for this weeks medications.  I advised him to contact me with any questions or concerns. ? ?There were no vitals taken for this visit. ?Weight yesterday-no weight taken yesterday ?Last visit weight-240lbs ? ?Patient Care Team: ?Rip Harbour as PCP - General (Internal Medicine) ?Larey Dresser, MD as PCP - Advanced Heart Failure (Cardiology) ?Deboraha Sprang, MD as PCP - Electrophysiology (Cardiology) ?Jorge Ny, LCSW as Education officer, museum (Licensed [employer] representative) ? ?Patient Active Problem List  ? Diagnosis Date Noted  ? Hyperlipidemia 09/09/2021  ? Wound dehiscence 03/02/2021  ? Subacute osteomyelitis, right ankle and foot ([place])   ? Sleep disturbance   ? Essential hypertension   ? Diabetic peripheral neuropathy ([place])   ? Labile blood glucose   ? Left below-knee amputee ([place]) 02/07/2021  ? PAD (peripheral artery disease) ([place]) 02/01/2021  ? Below-knee amputation of left lower extremity ([place]) 01/27/2021  ? S/P BKA (below knee amputation), left ([place])   ? Diabetic wet gangrene of the foot  ([place])   ? SOB (shortness of breath)   ? Open wound   ? Bacteremia   ? Chronic ulcer of left ankle ([place])   ? Severe sepsis without septic shock ([place])   ? COPD exacerbation ([place]) 01/15/2021  ? Chronic systolic CHF (congestive heart failure) ([place]) 01/15/2021  ? Acute on chronic respiratory failure with hypoxia and hypercapnia ([place]) 01/15/2021  ? Acute renal failure superimposed on stage 3 chronic kidney disease ([place]) 01/15/2021  ? Elevated troponin 01/15/2021  ? Hyponatremia 01/15/2021  ? CHF exacerbation ([place]) 10/17/2020  ? Acute on chronic systolic CHF (congestive heart failure) ([place]) 10/16/2020  ? Acute on chronic combined systolic and diastolic CHF (congestive heart failure) ([place]) 03/15/2018  ? S/P right and left heart catheterization 11/02/2017  ? Panic attacks 11/02/2017  ? Non-STEMI (non-ST elevated myocardial infarction) ([place])   ? COPD (chronic obstructive pulmonary disease) ([place])   ? Hypertension   ? CKD (chronic kidney disease)   ? 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[place])   ? Presence of implantable cardioverter-defibrillator (ICD)   ? GERD (gastroesophageal reflux disease)   ? Diabetes mellitus with diabetic neuropathy, with long-term current use of insulin ([place])   ? ? ?Current Outpatient Medications:  ?  albuterol (VENTOLIN HFA) 108 (90 Base) MCG/ACT inhaler, Inhale 1-2 puffs into the lungs every 4 (four) hours as needed for wheezing or shortness of breath., Disp: 8 g, Rfl: 1 ?  allopurinol (ZYLOPRIM) 100 MG tablet, Take 1 tablet (100 mg total) by mouth daily., Disp: 30 tablet, Rfl: 3 ?  aspirin 81 MG chewable tablet, Chew 81 mg by mouth daily., Disp: , Rfl:  ?  atorvastatin (LIPITOR) 40 MG tablet, TAKE ONE TABLET BY MOUTH DAILY, Disp: 90 tablet, Rfl: 3 ?  clopidogrel (PLAVIX) 75 MG tablet, TAKE ONE TABLET BY MOUTH DAILY, Disp: 30 tablet, Rfl: 11 ?  digoxin (LANOXIN) 0.125 MG tablet, Take 1 tablet (0.125 mg total) by mouth daily., Disp: 60 tablet, Rfl: 3 ?  docusate sodium (COLACE) 100 MG capsule, Take 1 capsule (100  mg total) by mouth daily., Disp: 10 capsule, Rfl: 0 ?  DULoxetine (CYMBALTA) 60 MG capsule, Take 1 capsule (60 mg total) by mouth daily., Disp: 30 capsule, Rfl: 2 ?  empagliflozin (JARDIANCE) 10 MG TABS tablet, Take 1 tablet (10 mg total) by mouth daily., Disp: 30 tablet, Rfl: 0 ?  fenofibrate (TRICOR) 145 MG tablet, TAKE ONE TABLET BY MOUTH DAILY, Disp: 90 tablet, Rfl: 3 ?  Fluticasone Furoate-Vilanterol (BREO ELLIPTA IN), Inhale into the lungs daily as needed., Disp: , Rfl:  ?  gabapentin (NEURONTIN) 100 MG capsule, Take 2 capsules (200 mg total) by mouth 3 (three) times daily., Disp: 180 capsule, Rfl: 0 ?  hydrALAZINE (APRESOLINE) 50 MG tablet, Take 1.5 tablets (75 mg total) by mouth 3 (three) times daily. NEEDS APPOINTMENT FOR MORE REFILLS, Disp: 540 tablet, Rfl: 0 ?  insulin glargine-yfgn (SEMGLEE, YFGN,) 100 UNIT/ML injection, Inject 0.08 mLs (8 Units total) into the skin 2 (two) times daily., Disp: 10 mL, Rfl: 11 ?  insulin regular (NOVOLIN R) 100 units/mL injection, Inject into the skin as needed for high blood sugar. As per sliding scale, Disp: , Rfl:  ?  insulin regular human CONCENTRATED (HUMULIN R U-500 KWIKPEN) 500 UNIT/ML KwikPen, Inject into the skin., Disp: , Rfl:  ?  isosorbide mononitrate (IMDUR) 30 MG 24 hr tablet, Take 1 tablet (30 mg total) by mouth daily., Disp: 30 tablet, Rfl: 11 ?  MAGNESIUM-OXIDE 400 (241.3 Mg) MG tablet, Take 1 tablet (400 mg total) by mouth daily., Disp: 30 tablet, Rfl: 11 ?  Multiple Vitamin (MULTIVITAMIN WITH MINERALS) TABS tablet, Take 1 tablet by mouth daily., Disp: , Rfl:  ?  sacubitril-valsartan (ENTRESTO) 24-26 MG, Take 1 tablet by mouth 2 (two) times daily., Disp: 60 tablet, Rfl: 0 ?  spironolactone (ALDACTONE) 25 MG tablet, TAKE HALF TABLET BY MOUTH DAILY, Disp: 30 tablet, Rfl: 0 ?  torsemide (DEMADEX) 20 MG tablet, Take 1 tablet (20 mg total) by mouth 2 (two) times daily. Every other day, Disp: 60 tablet, Rfl: 3 ?  bisoprolol (ZEBETA) 5 MG tablet, Take 0.5  tablets (2.5 mg total) by mouth daily., Disp: 30 tablet, Rfl: 0 ?  busPIRone (BUSPAR) 5 MG tablet, Take 1 tablet (5 mg total) by mouth 2 (two) times daily. (Patient not taking: Reported on 10/13/2021), Disp: 60 tablet, Rfl: 0 ?  melatonin 3 MG TABS tablet, TAKE ONE TABLET BY MOUTH AT BEDTIME (Patient not taking: Reported on 09/26/2021), Disp: 30 tablet, Rfl: 0 ?  zinc sulfate 220 (50 Zn) MG capsule, Take 1 capsule (220 mg total) by mouth daily. (Patient not taking: Reported on 09/26/2021), Disp: 30 capsule, Rfl: 0 ?Allergies  ?Allergen Reactions  ? Aspirin Itching  ? Codeine Hives  ? Coconut Oil Itching  ? ? ? ? ?Social History  ? ?Socioeconomic History  ? Marital status: Divorced  ?  Spouse name: Not on file  ?  Number of children: Not on file  ? Years of education: Not on file  ? Highest education level: Not on file  ?Occupational History  ? Occupation: disability  ?  Comment: stopped working in 2000 d/t occupational exposures  ?Tobacco Use  ? Smoking status: Former  ?  Types: Cigarettes  ? Smokeless tobacco: Never  ? Tobacco comments:  ?  "smoked when I drank"  ?Vaping Use  ? Vaping Use: Never used  ?Substance and Sexual Activity  ? Alcohol use: Not Currently  ?  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 Drug use: Never  ? Sexual activity: Not Currently  ?Other Topics Concern  ? Not on file  ?Social History Narrative  ? Patient lives at home with wife and some of his 10 children. He self-administers his own medications.  ? ?Social Determinants of Health  ? ?Financial Resource Strain: Low Risk   ? Difficulty of Paying Living Expenses: Not hard at all  ?Food Insecurity: No Food Insecurity  ? Worried About Charity fundraiser in the Last Year: Never true  ? Ran Out of Food in the Last Year: Never true  ?Transportation Needs: Unmet Transportation Needs  ? Lack of Transportation (Medical): Yes  ? Lack of Transportation (Non-Medical): Yes  ?Physical Activity: Not on file  ?Stress: Not on file  ?Social  Connections: Not on file  ?Intimate Partner Violence: Not on file  ? ? ?Physical Exam ? ? ? ? ? ?Future Appointments  ?Date Time Provider [place]  ?10/18/2021  3:30 PM [facility] PA/NP [facility] None  ?6

## 2021-10-17 ENCOUNTER — Telehealth (HOSPITAL_COMMUNITY): Payer: Self-pay

## 2021-10-17 NOTE — Telephone Encounter (Signed)
Called to confirm/remind patient of their appointment at the [facility] on 10/18/21.  ? ?Patient reminded to bring all medications and/or complete list. ? ?Confirmed patient has transportation. Gave directions, instructed to utilize valet parking. ? ?Confirmed appointment prior to ending call.  ? ?

## 2021-10-18 ENCOUNTER — Ambulatory Visit (HOSPITAL_COMMUNITY)
Admission: RE | Admit: 2021-10-18 | Discharge: 2021-10-18 | Disposition: A | Discharge status: Home / Self Care | Payer: Medicare Other | Source: Ambulatory Visit | Attending: Family Medicine | Admitting: Family Medicine

## 2021-10-18 ENCOUNTER — Other Ambulatory Visit (HOSPITAL_COMMUNITY): Payer: Self-pay

## 2021-10-18 ENCOUNTER — Encounter (HOSPITAL_COMMUNITY): Payer: Self-pay

## 2021-10-18 VITALS — BP 126/82 | HR 75 | Wt 231.0 lb

## 2021-10-18 DIAGNOSIS — I428 Other cardiomyopathies: Secondary | ICD-10-CM | POA: Insufficient documentation

## 2021-10-18 DIAGNOSIS — Z794 Long term (current) use of insulin: Secondary | ICD-10-CM

## 2021-10-18 DIAGNOSIS — N183 Chronic kidney disease, stage 3 unspecified: Secondary | ICD-10-CM | POA: Diagnosis not present

## 2021-10-18 DIAGNOSIS — Z7982 Long term (current) use of aspirin: Secondary | ICD-10-CM | POA: Diagnosis not present

## 2021-10-18 DIAGNOSIS — Z79899 Other long term (current) drug therapy: Secondary | ICD-10-CM | POA: Insufficient documentation

## 2021-10-18 DIAGNOSIS — I5022 Chronic systolic (congestive) heart failure: Secondary | ICD-10-CM | POA: Insufficient documentation

## 2021-10-18 DIAGNOSIS — Z95810 Presence of automatic (implantable) cardiac defibrillator: Secondary | ICD-10-CM | POA: Insufficient documentation

## 2021-10-18 DIAGNOSIS — I493 Ventricular premature depolarization: Secondary | ICD-10-CM | POA: Insufficient documentation

## 2021-10-18 DIAGNOSIS — I130 Hypertensive heart and chronic kidney disease with heart failure and stage 1 through stage 4 chronic kidney disease, or unspecified chronic kidney disease: Secondary | ICD-10-CM | POA: Diagnosis not present

## 2021-10-18 DIAGNOSIS — Z9981 Dependence on supplemental oxygen: Secondary | ICD-10-CM | POA: Diagnosis not present

## 2021-10-18 DIAGNOSIS — E1122 Type 2 diabetes mellitus with diabetic chronic kidney disease: Secondary | ICD-10-CM | POA: Insufficient documentation

## 2021-10-18 DIAGNOSIS — D751 Secondary polycythemia: Secondary | ICD-10-CM | POA: Diagnosis not present

## 2021-10-18 DIAGNOSIS — Z955 Presence of coronary angioplasty implant and graft: Secondary | ICD-10-CM | POA: Insufficient documentation

## 2021-10-18 DIAGNOSIS — J449 Chronic obstructive pulmonary disease, unspecified: Secondary | ICD-10-CM | POA: Insufficient documentation

## 2021-10-18 DIAGNOSIS — E785 Hyperlipidemia, unspecified: Secondary | ICD-10-CM | POA: Insufficient documentation

## 2021-10-18 DIAGNOSIS — E781 Pure hyperglyceridemia: Secondary | ICD-10-CM | POA: Diagnosis not present

## 2021-10-18 DIAGNOSIS — G4733 Obstructive sleep apnea (adult) (pediatric): Secondary | ICD-10-CM | POA: Diagnosis not present

## 2021-10-18 DIAGNOSIS — E1159 Type 2 diabetes mellitus with other circulatory complications: Secondary | ICD-10-CM

## 2021-10-18 DIAGNOSIS — I2510 Atherosclerotic heart disease of native coronary artery without angina pectoris: Secondary | ICD-10-CM | POA: Diagnosis not present

## 2021-10-18 LAB — BASIC METABOLIC PANEL
Anion gap: 11 (ref 5–15)
BUN: 36 mg/dL — ABNORMAL HIGH (ref 6–20)
CO2: 32 mmol/L (ref 22–32)
Calcium: 9.6 mg/dL (ref 8.9–10.3)
Chloride: 96 mmol/L — ABNORMAL LOW (ref 98–111)
Creatinine, Ser: 1.71 mg/dL — ABNORMAL HIGH (ref 0.61–1.24)
GFR, Estimated: 46 mL/min — ABNORMAL LOW (ref >60)
Glucose, Bld: 210 mg/dL — ABNORMAL HIGH (ref 70–99)
Potassium: 4.3 mmol/L (ref 3.5–5.1)
Sodium: 139 mmol/L (ref 135–145)

## 2021-10-18 LAB — DIGOXIN LEVEL: Digoxin Level: 0.8 ng/mL (ref 0.8–2.0)

## 2021-10-18 NOTE — Progress Notes (Signed)
?  ? ?Advanced Heart Failure Clinic Note  ? ?ID:  Richard [name], DOB 1961/03/09, MRN [id number]   ?Provider location: [facility] ?Type of Visit: Established patient  ? ?PCP:  Robert Bellow, PA-C ?HF Cardiologist:  Dr. Aundra Dubin ?  ?HPI ?Richard [name] is a 61 y.o. male who has a history of chronic systolic HF s/p Medtronic ICD (12/2016), HTN, DM, and HLD.  ? ?Admitted [id number] with A/C systolic HF. Echo showed EF 15-20%. Advanced HF team was consulted. He diuresed 20 lbs with IV lasix, then transitioned to torsemide 40 mg daily. Underwent R/LHC showing a focal severe RCA stenosis treated with DES.  He did not, however, appear to have severe enough coronary disease to explain his cardiomyopathy. Cardiac index was low at 1.9.  HF meds were optimized. Beta blocker was not started due to low CI and soft BPs. He was discharged on home oxygen. Referred to cardiac rehab. DC weight: 260 lbs.  ? ?Echo in 2/20 showed EF 20-25% with severe LV dilation, moderate RV enlargement with moderately decreased RV systolic function.  CPX in 2/21 showed a severe functional limitation but it actually appeared to be primarily due to pulmonary issues.  ? ?Echo in 1/22 showed EF < 20% with severe LV dilation, normal RV size with moderately decreased systolic function, dilated IVC.  ? ?He was admitted in 4/22 with CHF.  ? ?TEE in 7/22 showed EF 20% with severe LV dilation, moderate RV enlargement with moderately decreased RV systolic function.  ? 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 ? ?Follow up on 09/09/21, volume overloaded after running out of torsemide and dig. Meds refilled/resumed and instructed to increase torsemide to 80 mg daily x 2 days then 60 mg daily. Imdur was also increased to 60 mg daily for better BP control. Follow up 09/22/21, NYHA II symptoms, volume on the dry side with low BP. Entresto held, Imdur decreased to 30, torsemide changed to QOD.  ? ?Today he  returns for HF follow up with wife and Nira Conn with paramedicine. Overall feeling fine. No significant dyspnea with activity or walking up stairs. He lifts weights 3 days a week. Denies palpitations, abnormal bleeding, CP, dizziness, edema, or PND/Orthopnea. Appetite ok. No fever or chills. Weight at home 230 pounds. Taking all medications.  ? ?Medtronic device interrogation (personally reviewed): OptiVol down, thoracic impedence stable,  < 1 hour daily activity, no recent AT/AF ? ?Labs (10/19): K 3.9, creatinine 1.18  ?Labs (10/20): BNP 160, K 3.8, creatinine 1.31 ?Labs (4/21): TGs 475, LDL 67, K 4.2, creatinine 1.8, urine and serum immunofixations negative.  ?Labs (11/21): LDL 42, HDL 27, K 4.3, creatinine 1.81 ?Labs (5/22): K 4.5, creatinine 1.46 ?Labs (11/19/20): SCr 2.0, 4.5  ?Labs (11/22): K 4.6, creatinine 1.15 ?Labs (3/22): K 4.3, creatinine 1.46. LDL 42, TG 68  ? ?PMH: ?1. Chronic systolic CHF: Primarily nonischemic cardiomyopathy.  Has Medtronic ICD.   ?- Echo 5/19: EF 15-20%, grade 2 DD, mild MR, RV severely reduced, LA moderately dilated, RA mildly dilated, mild TR.  ?- RHC/LHC (5/19): 80% distal LAD stenosis, 80% proximal RCA stenosis treated with DES to pRCA. Mean RA 13, PA 69/31 mean 47, mean 24, CI 1.91, PVR 4.8 WU.  ?- Gynecomastia with spironolactone. ?- Echo (2/20): EF 20-25%, severe LV enlargement, moderate RV enlargement, moderately decreased RV systolic function.  ?- CPX (2/21): peak VO2 13.2, VE/VCO2 slope 24, RER 1.10. Severe functional limitation due to OHS with  severe restrictive lung disease without significant HF limitation.  ?- PYP scan (7/21): Negative for TTR cardiac amyloidosis.  ?- Echo (1/22): EF < 20% with severe LV dilation, normal RV size with moderately decreased systolic function, dilated IVC.  ?- TEE (7/22): EF 20% with severe LV dilation, moderate RV enlargement with moderately decreased RV systolic function.  ?2. COPD: Uses 2L home oxygen. Never smoked, but apparently had  significant occupational exposure.  It appears that he won a lawsuit dealing with the occupational exposure-related COPD.  ?3. CAD: Cath in 5/17 with 60% RCA stenosis.  ?- LHC (5/19): 80% distal LAD, 80% proximal RCA.  DES to proximal RCA.  ?4. Polycythemia: Probably due to chronic hypoxemia.  ?5. Type II diabetes.  ?6. HTN ?7. GERD ?8. CKD: Stage 3.  Likely due to diabetes and HTN.  ?9. OSA: Unable to tolerate CPAP.  ?10. PAD: Right and left BKA 2022.  ?11. 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 Group B Strep bacteremia ? ?Review of systems complete and found to be negative unless listed in HPI.   ? ?Current Outpatient Medications  ?Medication Sig Dispense Refill  ? allopurinol (ZYLOPRIM) 100 MG tablet Take 1 tablet (100 mg total) by mouth daily. 30 tablet 3  ? aspirin 81 MG chewable tablet Chew 81 mg by mouth daily.    ? atorvastatin (LIPITOR) 40 MG tablet TAKE ONE TABLET BY MOUTH DAILY 90 tablet 3  ? bisoprolol (ZEBETA) 5 MG tablet Take 0.5 tablets (2.5 mg total) by mouth daily. 30 tablet 0  ? clopidogrel (PLAVIX) 75 MG tablet TAKE ONE TABLET BY MOUTH DAILY 30 tablet 11  ? digoxin (LANOXIN) 0.125 MG tablet Take 1 tablet (0.125 mg total) by mouth daily. 60 tablet 3  ? docusate sodium (COLACE) 100 MG capsule Take 1 capsule (100 mg total) by mouth daily. 10 capsule 0  ? DULoxetine (CYMBALTA) 60 MG capsule Take 1 capsule (60 mg total) by mouth daily. 30 capsule 2  ? empagliflozin (JARDIANCE) 25 MG TABS tablet Take 25 mg by mouth daily.    ? Fluticasone Furoate-Vilanterol (BREO ELLIPTA IN) Inhale into the lungs daily as needed.    ? gabapentin (NEURONTIN) 100 MG capsule Take 2 capsules (200 mg total) by mouth 3 (three) times daily. 180 capsule 0  ? hydrALAZINE (APRESOLINE) 50 MG tablet Take 1.5 tablets (75 mg total) by mouth 3 (three) times daily. NEEDS APPOINTMENT FOR MORE REFILLS 540 tablet 0  ? insulin glargine-yfgn (SEMGLEE, YFGN,) 100 UNIT/ML injection Inject 0.08 mLs (8 Units total) into the skin 2 (two)  times daily. 10 mL 11  ? insulin regular (NOVOLIN R) 100 units/mL injection Inject into the skin as needed for high blood sugar. As per sliding scale    ? insulin regular human CONCENTRATED (HUMULIN R U-500 KWIKPEN) 500 UNIT/ML KwikPen Inject into the skin.    ? isosorbide mononitrate (IMDUR) 30 MG 24 hr tablet Take 1 tablet (30 mg total) by mouth daily. 30 tablet 11  ? MAGNESIUM-OXIDE 400 (241.3 Mg) MG tablet Take 1 tablet (400 mg total) by mouth daily. 30 tablet 11  ? Multiple Vitamin (MULTIVITAMIN WITH MINERALS) TABS tablet Take 1 tablet by mouth daily.    ? sacubitril-valsartan (ENTRESTO) 24-26 MG Take 1 tablet by mouth 2 (two) times daily. 60 tablet 0  ? spironolactone (ALDACTONE) 25 MG tablet TAKE HALF TABLET BY MOUTH DAILY 30 tablet 0  ? torsemide (DEMADEX) 20 MG tablet Take 40 mg by mouth daily.    ?  albuterol (VENTOLIN HFA) 108 (90 Base) MCG/ACT inhaler Inhale 1-2 puffs into the lungs every 4 (four) hours as needed for wheezing or shortness of breath. 8 g 1  ? zinc sulfate 220 (50 Zn) MG capsule Take 1 capsule (220 mg total) by mouth daily. (Patient not taking: Reported on 10/18/2021) 30 capsule 0  ? ?No current facility-administered medications for this encounter.  ? ?Allergies  ?Allergen Reactions  ? Aspirin Itching  ? Codeine Hives  ? Coconut Oil Itching  ? ?Social History  ? ?Socioeconomic History  ? Marital status: Divorced  ?  Spouse name: Not on file  ? Number of children: Not on file  ? Years of education: Not on file  ? Highest education level: Not on file  ?Occupational History  ? Occupation: disability  ?  Comment: stopped working in 2000 d/t occupational exposures  ?Tobacco Use  ? Smoking status: Former  ?  Types: Cigarettes  ? Smokeless tobacco: Never  ? Tobacco comments:  ?  "smoked when I drank"  ?Vaping Use  ? Vaping Use: Never used  ?Substance and Sexual Activity  ? Alcohol use: Not Currently  ?  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 Drug use: Never  ? Sexual  activity: Not Currently  ?Other Topics Concern  ? Not on file  ?Social History Narrative  ? Patient lives at home with wife and some of his 10 children. He self-administers his own medications.  ? ?Social Det

## 2021-10-18 NOTE — Patient Instructions (Signed)
Thank you for coming in today ? ?Labs were done today, if any labs are abnormal the clinic will call you ?No news is good news  ? ?Your physician recommends that you schedule a follow-up appointment in:  ?2 months in clinic  ?4-5 months with Dr. Aundra Dubin ? ?At the [facility], you and your health needs are our priority. As part of our continuing mission to provide you with exceptional heart care, we have created designated Provider Care Teams. These Care Teams include your primary Cardiologist (physician) and Advanced Practice Providers (APPs- Physician Assistants and Nurse Practitioners) who all work together to provide you with the care you need, when you need it.  ? ?You may see any of the following providers on your designated Care Team at your next follow up: ?Dr Glori Bickers ?Dr Loralie Champagne ?Darrick Grinder, NP ?Lyda Jester, PA ?Jessica Milford,NP ?Marlyce Huge, PA ?Audry Riles, PharmD ? ? ?Please be sure to bring in all your medications bottles to every appointment.  ? ?If you have any questions or concerns before your next appointment please send [place] a message through [place] or call our office at [phone].   ? ?TO LEAVE A MESSAGE FOR THE NURSE SELECT OPTION 2, PLEASE LEAVE A MESSAGE INCLUDING: ?YOUR NAME ?DATE OF BIRTH ?CALL BACK NUMBER ?REASON FOR CALL**this is important as we prioritize the call backs ? ?YOU WILL RECEIVE A CALL BACK THE SAME DAY AS LONG AS YOU CALL BEFORE 4:00 PM ? ?

## 2021-10-18 NOTE — Progress Notes (Signed)
Paramedicine Encounter ? ? ? Patient ID: Richard Richard, male    DOB: [birth year], 61 y.o.   MRN: [id number] ? ? ?Met with [name] today accompanied by his wife Richard Richard in clinic where he was seen by [name] NP.  ? ?He reports feeling well with no complaints of shortness of breath, chest pain or swelling. Fluid levels looked good per Medtronic and assessment with NP.  ? ?No med changes pending labs today.  ? ?Meds verified and confirmed, list updated. ?Pill box filled for one week.  ? ?We reviewed upcoming appointments.  ? ?Encouraged him to weigh daily, avoid sodium filled foods and to take his medications daily as in his pill box.  ? ?He agreed to follow up home visit with [employer] next week.  ? ?Refills called into [facility]: ?Magnesium (out of refills pharmacy will fax for refills) ? ? ? ?ACTION: ?Home visit completed ? ? ? ? ? ? ?

## 2021-10-19 ENCOUNTER — Ambulatory Visit: Payer: Medicare Other | Admitting: Physical Therapy

## 2021-10-19 ENCOUNTER — Telehealth (HOSPITAL_COMMUNITY): Payer: Self-pay | Admitting: Cardiology

## 2021-10-19 ENCOUNTER — Telehealth (HOSPITAL_COMMUNITY): Payer: Self-pay

## 2021-10-19 ENCOUNTER — Other Ambulatory Visit (HOSPITAL_COMMUNITY): Payer: Self-pay | Admitting: Cardiology

## 2021-10-19 DIAGNOSIS — I5022 Chronic systolic (congestive) heart failure: Secondary | ICD-10-CM

## 2021-10-19 MED ORDER — MAGNESIUM OXIDE -MG SUPPLEMENT 200 MG PO TABS: 400.0000 mg | ORAL_TABLET | Freq: Every day | ORAL | 11 refills | Status: DC

## 2021-10-19 MED ORDER — TORSEMIDE 20 MG PO TABS: 20.0000 mg | ORAL_TABLET | Freq: Every day | ORAL | 6 refills | Status: DC

## 2021-10-19 NOTE — Telephone Encounter (Signed)
-----   Message from Jacklynn Ganong, [place] sent at 10/19/2021  8:07 AM EDT ----- ?Kidney function elevated. Please decrease torsemide to 20 mg daily. Repeat BMET in 10-14 days. He is followed by paramedicine, Herbert Seta. ?

## 2021-10-19 NOTE — Telephone Encounter (Signed)
Will forward to [facility] with para medicine to assist with medication changes ?Medications update  ?

## 2021-10-19 NOTE — Telephone Encounter (Signed)
Per Prince Rome NP- kidney function elevated and Torsemide is to be reduced to 20mg  daily.  ? ?I reached out to and his wife [place] to have them remove the second torsemide dose in his pill box. I also informed them he would have repeat labs in 10-14 days. I will relay this to [name] S. 03-21-1978) also.  ?

## 2021-10-20 ENCOUNTER — Other Ambulatory Visit (HOSPITAL_COMMUNITY): Payer: Self-pay | Admitting: Cardiology

## 2021-10-25 ENCOUNTER — Other Ambulatory Visit (HOSPITAL_COMMUNITY): Payer: Self-pay | Admitting: Emergency Medicine

## 2021-10-25 NOTE — Progress Notes (Signed)
Paramedicine Encounter ? ? ? Patient ID: Richard [name], male    DOB: [birth year], 62 y.o.   MRN: [id number] ? ?ATF Richard [name] A&O x 4 sitting at dining room table per normal during my visits.  Pt has no complaint of chest pain or SOB.  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  He does state that his prosthetic legs seem to be fitting a little more loosely than they have been and does complain of some discomfort to the back of his knee area.  No tissue breakdown or redness noted.  Suggested that he contact his ortho doctor about the changes in his prosthetic fit.   ?In our conversation during the visit pt did seem somewhat "down" (depressed).  He has mentioned being frustrated by not being able to do all the active things he used to do.  He talked about not being able to play basketball with his kids anymore and not being able to run which he said was something he enjoys doing.  He also talked about concerns with erectile dysfunction issues.  He states he is able to get an erection but that he could not maintain an erection and that he is afraid his wife might leave him because of it.  I recommended he discuss it at his upcoming appointment on 5/10.   ?Richard [name] also stated, "I'm tired of taking all these medications. I've seen commercials on TV about these [employer] [place] [employer] that are supposed to take the place of medicines."  I discussed with him that the medications he is prescribed are for specific disease processes connected to his heart failure and encouraged him to keep taking his medications as prescribed.  He had been missing quite a few of his doses this time and at the time of my visit today had not taken his morning medications and had not even had breakfast.   ?All medications reviewed and pill box reconciled w/o incident.  [place]  to advised of needed refills and was advised that his medications will be delivered to him on 10/31/21. ? ? ?BP 120/70   Pulse 61   Resp 16   Wt 230 lb 6.4 oz (104.5  kg)   SpO2 100%   BMI 27.32 kg/m?   CBG 221 ?Weight yesterday-did not weigh  ?Last visit weight-230lbs w/ prosthetics ? ?Patient Care Team: ?Rip Harbour as PCP - General (Internal Medicine) ?Larey Dresser, MD as PCP - Advanced Heart Failure (Cardiology) ?Deboraha Sprang, MD as PCP - Electrophysiology (Cardiology) ?Jorge Ny, LCSW as Education officer, museum (Licensed Holiday representative) ? ?Patient Active Problem List  ? Diagnosis Date Noted  ? Hyperlipidemia 09/09/2021  ? Wound dehiscence 03/02/2021  ? Subacute osteomyelitis, right ankle and foot ([place])   ? Sleep disturbance   ? Essential hypertension   ? Diabetic peripheral neuropathy ([place])   ? Labile blood glucose   ? Left below-knee amputee ([place]) 02/07/2021  ? PAD (peripheral artery disease) ([place]) 02/01/2021  ? Below-knee amputation of left lower extremity ([place]) 01/27/2021  ? S/P BKA (below knee amputation), left ([place])   ? 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 SOB (shortness of breath)   ? Open wound   ? Bacteremia   ? Chronic ulcer of left ankle ([place])   ? Severe sepsis without septic shock ([place])   ? COPD exacerbation ([place]) 01/15/2021  ? Chronic systolic CHF (congestive heart failure) ([place]) 01/15/2021  ? Acute on chronic respiratory failure  with hypoxia and hypercapnia ([place]) 01/15/2021  ? Acute renal failure superimposed on stage 3 chronic kidney disease ([place]) 01/15/2021  ? Elevated troponin 01/15/2021  ? Hyponatremia 01/15/2021  ? CHF exacerbation ([place]) 10/17/2020  ? Acute on chronic systolic CHF (congestive heart failure) ([place]) 10/16/2020  ? Acute on chronic combined systolic and diastolic CHF (congestive heart failure) ([place]) 03/15/2018  ? S/P right and left heart catheterization 11/02/2017  ? Panic attacks 11/02/2017  ? Non-STEMI (non-ST elevated myocardial infarction) ([place])   ? COPD (chronic obstructive pulmonary disease) ([place])   ? Hypertension   ? CKD (chronic kidney disease)   ? 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[place])   ? Presence of implantable  cardioverter-defibrillator (ICD)   ? GERD (gastroesophageal reflux disease)   ? Diabetes mellitus with diabetic neuropathy, with long-term current use of insulin ([place])   ? ? ?Current Outpatient Medications:  ?  albuterol (VENTOLIN HFA) 108 (90 Base) MCG/ACT inhaler, Inhale 1-2 puffs into the lungs every 4 (four) hours as needed for wheezing or shortness of breath., Disp: 8 g, Rfl: 1 ?  allopurinol (ZYLOPRIM) 100 MG tablet, Take 1 tablet (100 mg total) by mouth daily., Disp: 30 tablet, Rfl: 3 ?  aspirin 81 MG chewable tablet, Chew 81 mg by mouth daily., Disp: , Rfl:  ?  atorvastatin (LIPITOR) 40 MG tablet, TAKE ONE TABLET BY MOUTH DAILY, Disp: 90 tablet, Rfl: 3 ?  bisoprolol (ZEBETA) 5 MG tablet, Take 0.5 tablets (2.5 mg total) by mouth daily., Disp: 30 tablet, Rfl: 0 ?  clopidogrel (PLAVIX) 75 MG tablet, TAKE ONE TABLET BY MOUTH DAILY, Disp: 30 tablet, Rfl: 11 ?  digoxin (LANOXIN) 0.125 MG tablet, Take 1 tablet (0.125 mg total) by mouth daily., Disp: 60 tablet, Rfl: 3 ?  docusate sodium (COLACE) 100 MG capsule, Take 1 capsule (100 mg total) by mouth daily., Disp: 10 capsule, Rfl: 0 ?  DULoxetine (CYMBALTA) 60 MG capsule, Take 1 capsule (60 mg total) by mouth daily., Disp: 30 capsule, Rfl: 2 ?  empagliflozin (JARDIANCE) 25 MG TABS tablet, Take 25 mg by mouth daily., Disp: , Rfl:  ?  fenofibrate (TRICOR) 145 MG tablet, Take 145 mg by mouth daily., Disp: , Rfl:  ?  Fluticasone Furoate-Vilanterol (BREO ELLIPTA IN), Inhale into the lungs daily as needed., Disp: , Rfl:  ?  gabapentin (NEURONTIN) 400 MG capsule, Take 400 mg by mouth 3 (three) times daily., Disp: , Rfl:  ?  hydrALAZINE (APRESOLINE) 50 MG tablet, Take 1.5 tablets (75 mg total) by mouth 3 (three) times daily., Disp: 540 tablet, Rfl: 2 ?  insulin glargine-yfgn (SEMGLEE, YFGN,) 100 UNIT/ML injection, Inject 0.08 mLs (8 Units total) into the skin 2 (two) times daily., Disp: 10 mL, Rfl: 11 ?  insulin regular (NOVOLIN R) 100 units/mL injection, Inject into the  skin as needed for high blood sugar. As per sliding scale, Disp: , Rfl:  ?  insulin regular human CONCENTRATED (HUMULIN R U-500 KWIKPEN) 500 UNIT/ML KwikPen, Inject into the skin., Disp: , Rfl:  ?  isosorbide mononitrate (IMDUR) 30 MG 24 hr tablet, Take 1 tablet (30 mg total) by mouth daily., Disp: 30 tablet, Rfl: 11 ?  Magnesium Oxide 200 MG TABS, Take 2 tablets (400 mg total) by mouth daily., Disp: 60 tablet, Rfl: 11 ?  Multiple Vitamin (MULTIVITAMIN WITH MINERALS) TABS tablet, Take 1 tablet by mouth daily., Disp: , Rfl:  ?  sacubitril-valsartan (ENTRESTO) 24-26 MG, Take 1 tablet by mouth 2 (two) times daily., Disp: 60 tablet, Rfl: 0 ?  spironolactone (ALDACTONE) 25 MG tablet, TAKE HALF TABLET BY MOUTH DAILY, Disp: 30 tablet, Rfl: 0 ?  torsemide (DEMADEX) 20 MG tablet, Take 1 tablet (20 mg total) by mouth daily., Disp: 30 tablet, Rfl: 6 ?  zinc sulfate 220 (50 Zn) MG capsule, Take 1 capsule (220 mg total) by mouth daily. (Patient not taking: Reported on 10/18/2021), Disp: 30 capsule, Rfl: 0 ?Allergies  ?Allergen Reactions  ? Aspirin Itching  ? Codeine Hives  ? Coconut Oil Itching  ? ? ? ? ?Social History  ? ?Socioeconomic History  ? Marital status: Divorced  ?  Spouse name: Not on file  ? Number of children: Not on file  ? Years of education: Not on file  ? Highest education level: Not on file  ?Occupational History  ? Occupation: disability  ?  Comment: stopped working in 2000 d/t occupational exposures  ?Tobacco Use  ? Smoking status: Former  ?  Types: Cigarettes  ? Smokeless tobacco: Never  ? Tobacco comments:  ?  "smoked when I drank"  ?Vaping Use  ? Vaping Use: Never used  ?Substance and Sexual Activity  ? Alcohol use: Not Currently  ?  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 Drug use: Never  ? Sexual activity: Not Currently  ?Other Topics Concern  ? Not on file  ?Social History Narrative  ? Patient lives at home with wife and some of his 10 children. He self-administers his own  medications.  ? ?Social Determinants of Health  ? ?Financial Resource Strain: Low Risk   ? Difficulty of Paying Living Expenses: Not hard at all  ?Food Insecurity: No Food Insecurity  ? Worried About [employer]

## 2021-10-26 ENCOUNTER — Ambulatory Visit: Payer: Medicare Other

## 2021-11-02 ENCOUNTER — Ambulatory Visit (HOSPITAL_COMMUNITY)
Admission: RE | Admit: 2021-11-02 | Discharge: 2021-11-02 | Disposition: A | Discharge status: Home / Self Care | Payer: Medicare Other | Source: Ambulatory Visit | Attending: Internal Medicine | Admitting: Internal Medicine

## 2021-11-02 DIAGNOSIS — I5022 Chronic systolic (congestive) heart failure: Secondary | ICD-10-CM | POA: Diagnosis present

## 2021-11-02 LAB — BASIC METABOLIC PANEL
Anion gap: 8 (ref 5–15)
BUN: 26 mg/dL — ABNORMAL HIGH (ref 6–20)
CO2: 28 mmol/L (ref 22–32)
Calcium: 9.7 mg/dL (ref 8.9–10.3)
Chloride: 104 mmol/L (ref 98–111)
Creatinine, Ser: 1.4 mg/dL — ABNORMAL HIGH (ref 0.61–1.24)
GFR, Estimated: 58 mL/min — ABNORMAL LOW (ref >60)
Glucose, Bld: 176 mg/dL — ABNORMAL HIGH (ref 70–99)
Potassium: 4.8 mmol/L (ref 3.5–5.1)
Sodium: 140 mmol/L (ref 135–145)

## 2021-11-03 ENCOUNTER — Other Ambulatory Visit (HOSPITAL_COMMUNITY): Payer: Self-pay | Admitting: Emergency Medicine

## 2021-11-03 NOTE — Progress Notes (Signed)
Paramedicine Encounter ?  ? ? Patient ID: Richard [name], male    DOB: 11-11-1960, 61 y.o.   MRN: [id number] ? ? ?BP 120/70 (BP Location: Left Arm, Patient Position: Sitting, Cuff Size: Normal)   Pulse 74   Resp 16   Wt 236 lb 4.8 oz (107.2 kg)   SpO2 97%   BMI 28.02 kg/m?  ?Weight yesterday-not taken ?Last visit weight-230lb ? ?Found Richard [name] to be pleasant and with no complaints.  Medications reconciled he had several medications that needed refills.  [facility] and they stated they were ready minus his Duloxetine and Entresto which needs refill from doctor.  These medications to be delivered tomorrow and I'll revisit to complete med rec.  Richard [name] desires to go to [facility] for his meds but I encouraged him to do at least two more weeks of pill boxes and make sure there are no more medication changes.   ? ? ?Patient Care Team: ?Rip Harbour as PCP - General (Internal Medicine) ?Larey Dresser, MD as PCP - Advanced Heart Failure (Cardiology) ?Deboraha Sprang, MD as PCP - Electrophysiology (Cardiology) ?Jorge Ny, LCSW as Education officer, museum (Licensed [employer] representative) ? ?Patient Active Problem List  ? Diagnosis Date Noted  ? Hyperlipidemia 09/09/2021  ? Wound dehiscence 03/02/2021  ? Subacute osteomyelitis, right ankle and foot ([place])   ? Sleep disturbance   ? Essential hypertension   ? Diabetic peripheral neuropathy ([place])   ? Labile blood glucose   ? Left below-knee amputee ([place]) 02/07/2021  ? PAD (peripheral artery disease) ([place]) 02/01/2021  ? Below-knee amputation of left lower extremity ([place]) 01/27/2021  ? S/P BKA (below knee amputation), left ([place])   ? 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[place])   ? SOB (shortness of breath)   ? Open wound   ? Bacteremia   ? Chronic ulcer of left ankle ([place])   ? Severe sepsis without septic shock ([place])   ? COPD exacerbation ([place]) 01/15/2021  ? Chronic systolic CHF (congestive heart failure) ([place]) 01/15/2021  ? Acute on chronic respiratory  failure with hypoxia and hypercapnia ([place]) 01/15/2021  ? Acute renal failure superimposed on stage 3 chronic kidney disease ([place]) 01/15/2021  ? Elevated troponin 01/15/2021  ? Hyponatremia 01/15/2021  ? CHF exacerbation ([place]) 10/17/2020  ? Acute on chronic systolic CHF (congestive heart failure) ([place]) 10/16/2020  ? Acute on chronic combined systolic and diastolic CHF (congestive heart failure) ([place]) 03/15/2018  ? S/P right and left heart catheterization 11/02/2017  ? Panic attacks 11/02/2017  ? Non-STEMI (non-ST elevated myocardial infarction) ([place])   ? COPD (chronic obstructive pulmonary disease) ([place])   ? Hypertension   ? CKD (chronic kidney disease)   ? 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[place])   ? Presence of implantable cardioverter-defibrillator (ICD)   ? GERD (gastroesophageal reflux disease)   ? Diabetes mellitus with diabetic neuropathy, with long-term current use of insulin ([place])   ? ? ?Current Outpatient Medications:  ?  allopurinol (ZYLOPRIM) 100 MG tablet, Take 1 tablet (100 mg total) by mouth daily., Disp: 30 tablet, Rfl: 3 ?  aspirin 81 MG chewable tablet, Chew 81 mg by mouth daily., Disp: , Rfl:  ?  atorvastatin (LIPITOR) 40 MG tablet, TAKE ONE TABLET BY MOUTH DAILY, Disp: 90 tablet, Rfl: 3 ?  bisoprolol (ZEBETA) 5 MG tablet, Take 0.5 tablets (2.5 mg total) by mouth daily., Disp: 30 tablet, Rfl: 0 ?  clopidogrel (PLAVIX) 75 MG tablet, TAKE ONE TABLET  BY MOUTH DAILY, Disp: 30 tablet, Rfl: 11 ?  digoxin (LANOXIN) 0.125 MG tablet, Take 1 tablet (0.125 mg total) by mouth daily., Disp: 60 tablet, Rfl: 3 ?  docusate sodium (COLACE) 100 MG capsule, Take 1 capsule (100 mg total) by mouth daily., Disp: 10 capsule, Rfl: 0 ?  DULoxetine (CYMBALTA) 60 MG capsule, Take 1 capsule (60 mg total) by mouth daily., Disp: 30 capsule, Rfl: 2 ?  empagliflozin (JARDIANCE) 25 MG TABS tablet, Take 25 mg by mouth daily., Disp: , Rfl:  ?  fenofibrate (TRICOR) 145 MG tablet, Take 145 mg by mouth daily., Disp: , Rfl:  ?  Fluticasone  Furoate-Vilanterol (BREO ELLIPTA IN), Inhale into the lungs daily as needed., Disp: , Rfl:  ?  gabapentin (NEURONTIN) 400 MG capsule, Take 400 mg by mouth 3 (three) times daily., Disp: , Rfl:  ?  hydrALAZINE (APRESOLINE) 50 MG tablet, Take 1.5 tablets (75 mg total) by mouth 3 (three) times daily., Disp: 540 tablet, Rfl: 2 ?  insulin glargine-yfgn (SEMGLEE, YFGN,) 100 UNIT/ML injection, Inject 0.08 mLs (8 Units total) into the skin 2 (two) times daily., Disp: 10 mL, Rfl: 11 ?  insulin regular (NOVOLIN R) 100 units/mL injection, Inject into the skin as needed for high blood sugar. As per sliding scale, Disp: , Rfl:  ?  insulin regular human CONCENTRATED (HUMULIN R U-500 KWIKPEN) 500 UNIT/ML KwikPen, Inject into the skin., Disp: , Rfl:  ?  isosorbide mononitrate (IMDUR) 30 MG 24 hr tablet, Take 1 tablet (30 mg total) by mouth daily., Disp: 30 tablet, Rfl: 11 ?  Magnesium Oxide 200 MG TABS, Take 2 tablets (400 mg total) by mouth daily., Disp: 60 tablet, Rfl: 11 ?  sacubitril-valsartan (ENTRESTO) 24-26 MG, Take 1 tablet by mouth 2 (two) times daily., Disp: 60 tablet, Rfl: 0 ?  spironolactone (ALDACTONE) 25 MG tablet, TAKE HALF TABLET BY MOUTH DAILY, Disp: 30 tablet, Rfl: 0 ?  zinc sulfate 220 (50 Zn) MG capsule, Take 1 capsule (220 mg total) by mouth daily., Disp: 30 capsule, Rfl: 0 ?  albuterol (VENTOLIN HFA) 108 (90 Base) MCG/ACT inhaler, Inhale 1-2 puffs into the lungs every 4 (four) hours as needed for wheezing or shortness of breath. (Patient not taking: Reported on 11/03/2021), Disp: 8 g, Rfl: 1 ?  Multiple Vitamin (MULTIVITAMIN WITH MINERALS) TABS tablet, Take 1 tablet by mouth daily., Disp: , Rfl:  ?  torsemide (DEMADEX) 20 MG tablet, Take 1 tablet (20 mg total) by mouth daily., Disp: 30 tablet, Rfl: 6 ?Allergies  ?Allergen Reactions  ? Aspirin Itching  ? Codeine Hives  ? Coconut Oil Itching  ? ? ? ? ?Social History  ? ?Socioeconomic History  ? Marital status: Divorced  ?  Spouse name: Not on file  ? Number of  children: Not on file  ? Years of education: Not on file  ? Highest education level: Not on file  ?Occupational History  ? Occupation: disability  ?  Comment: stopped working in 2000 d/t occupational exposures  ?Tobacco Use  ? Smoking status: Former  ?  Types: Cigarettes  ? Smokeless tobacco: Never  ? Tobacco comments:  ?  "smoked when I drank"  ?Vaping Use  ? Vaping Use: Never used  ?Substance and Sexual Activity  ? Alcohol use: Not Currently  ?  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 Drug use: Never  ? Sexual activity: Not Currently  ?Other Topics Concern  ? Not on file  ?Social History  Narrative  ? Patient lives at home with wife and some of his 10 children. He self-administers his own medications.  ? ?Social Determinants of Health  ? ?Financial Resource Strain: Low Risk   ? Difficulty of Paying Living Expenses: Not hard at all  ?Food Insecurity: No Food Insecurity  ? Worried About Charity fundraiser in the Last Year: Never true  ? Ran Out of Food in the Last Year: Never true  ?Transportation Needs: Unmet Transportation Needs  ? Lack of Transportation (Medical): Yes  ? Lack of Transportation (Non-Medical): Yes  ?Physical Activity: Not on file  ?Stress: Not on file  ?Social Connections: Not on file  ?Intimate Partner Violence: Not on file  ? ? ?Physical Exam ? ? ? ? ? ?Future Appointments  ?Date Time Provider [place]  ?11/29/2021  8:10 AM [facility] DEVICE REMOTES [facility]  ?12/08/2021  1:15 PM Newt Minion, MD [facility] None  ?12/19/2021 10:00 AM [facility] PA/NP [facility] None  ?02/16/2022 12:00 PM Larey Dresser, MD [facility] None  ? ? ? ? ? ?[name], EMT-Paramedic ?[phone] ?Community Health Paramedic  ?11/04/21  ?

## 2021-11-04 ENCOUNTER — Other Ambulatory Visit: Payer: Self-pay | Admitting: Cardiology

## 2021-11-04 ENCOUNTER — Other Ambulatory Visit (HOSPITAL_COMMUNITY): Payer: Self-pay | Admitting: *Deleted

## 2021-11-04 ENCOUNTER — Other Ambulatory Visit (HOSPITAL_COMMUNITY): Payer: Self-pay | Admitting: Emergency Medicine

## 2021-11-04 ENCOUNTER — Other Ambulatory Visit (HOSPITAL_COMMUNITY): Payer: Self-pay | Admitting: Cardiology

## 2021-11-04 MED ORDER — ENTRESTO 24-26 MG PO TABS: 1.0000 | ORAL_TABLET | Freq: Two times a day (BID) | ORAL | 6 refills | Status: DC

## 2021-11-04 NOTE — Progress Notes (Signed)
Paramedicine Encounter ? ? ? Patient ID: Richard [name], male    DOB: 04/30/1961, 61 y.o.   MRN: [id number] ? ?Followed up with [name] today to complete his med box as he was out of several medications on my visit yesterday.  I did pick up his Entresto and Duloxetine from [facility] which allowed me to complete the med rec.  [name] did seem a little depressed today as he said he thinks his wife has left him.  I did inquire as to whether he had any thoughts of harming himself and he said "no".  Tried to give him words of encouragement and told him to reach out if he needed anything. ? ?There were no vitals taken for this visit. ?Weight yesterday-not taken  ?Last visit weight-not taken ? ?Patient Care Team: ?Rip Harbour as PCP - General (Internal Medicine) ?Larey Dresser, MD as PCP - Advanced Heart Failure (Cardiology) ?Deboraha Sprang, MD as PCP - Electrophysiology (Cardiology) ?Jorge Ny, LCSW as Education officer, museum (Licensed Holiday representative) ? ?Patient Active Problem List  ? Diagnosis Date Noted  ? Hyperlipidemia 09/09/2021  ? Wound dehiscence 03/02/2021  ? Subacute osteomyelitis, right ankle and foot ([place])   ? Sleep disturbance   ? Essential hypertension   ? Diabetic peripheral neuropathy ([place])   ? Labile blood glucose   ? Left below-knee amputee ([place]) 02/07/2021  ? PAD (peripheral artery disease) ([place]) 02/01/2021  ? Below-knee amputation of left lower extremity ([place]) 01/27/2021  ? S/P BKA (below knee amputation), left ([place])   ? 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 SOB (shortness of breath)   ? Open wound   ? Bacteremia   ? Chronic ulcer of left ankle ([place])   ? Severe sepsis without septic shock ([place])   ? COPD exacerbation ([place]) 01/15/2021  ? Chronic systolic CHF (congestive heart failure) ([place]) 01/15/2021  ? Acute on chronic respiratory failure with hypoxia and hypercapnia ([place]) 01/15/2021  ? Acute renal failure superimposed on stage 3 chronic kidney disease ([place]) 01/15/2021   ? Elevated troponin 01/15/2021  ? Hyponatremia 01/15/2021  ? CHF exacerbation ([place]) 10/17/2020  ? Acute on chronic systolic CHF (congestive heart failure) ([place]) 10/16/2020  ? Acute on chronic combined systolic and diastolic CHF (congestive heart failure) ([place]) 03/15/2018  ? S/P right and left heart catheterization 11/02/2017  ? Panic attacks 11/02/2017  ? Non-STEMI (non-ST elevated myocardial infarction) ([place])   ? COPD (chronic obstructive pulmonary disease) ([place])   ? Hypertension   ? CKD (chronic kidney disease)   ? 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[place])   ? Presence of implantable cardioverter-defibrillator (ICD)   ? GERD (gastroesophageal reflux disease)   ? Diabetes mellitus with diabetic neuropathy, with long-term current use of insulin ([place])   ? ? ?Current Outpatient Medications:  ?  albuterol (VENTOLIN HFA) 108 (90 Base) MCG/ACT inhaler, Inhale 1-2 puffs into the lungs every 4 (four) hours as needed for wheezing or shortness of breath., Disp: 8 g, Rfl: 1 ?  allopurinol (ZYLOPRIM) 100 MG tablet, Take 1 tablet (100 mg total) by mouth daily., Disp: 30 tablet, Rfl: 3 ?  aspirin 81 MG chewable tablet, Chew 81 mg by mouth daily., Disp: , Rfl:  ?  atorvastatin (LIPITOR) 40 MG tablet, TAKE ONE TABLET BY MOUTH DAILY, Disp: 90 tablet, Rfl: 3 ?  bisoprolol (ZEBETA) 5 MG tablet, Take 0.5 tablets (2.5 mg total) by mouth daily., Disp: 30 tablet, Rfl: 0 ?  clopidogrel (  PLAVIX) 75 MG tablet, TAKE ONE TABLET BY MOUTH DAILY, Disp: 30 tablet, Rfl: 11 ?  digoxin (LANOXIN) 0.125 MG tablet, Take 1 tablet (0.125 mg total) by mouth daily., Disp: 60 tablet, Rfl: 3 ?  docusate sodium (COLACE) 100 MG capsule, Take 1 capsule (100 mg total) by mouth daily., Disp: 10 capsule, Rfl: 0 ?  DULoxetine (CYMBALTA) 60 MG capsule, Take 1 capsule (60 mg total) by mouth daily., Disp: 30 capsule, Rfl: 2 ?  empagliflozin (JARDIANCE) 25 MG TABS tablet, Take 25 mg by mouth daily., Disp: , Rfl:  ?  fenofibrate (TRICOR) 145 MG tablet, Take 145 mg by mouth  daily., Disp: , Rfl:  ?  Fluticasone Furoate-Vilanterol (BREO ELLIPTA IN), Inhale into the lungs daily as needed., Disp: , Rfl:  ?  gabapentin (NEURONTIN) 400 MG capsule, Take 400 mg by mouth 3 (three) times daily., Disp: , Rfl:  ?  hydrALAZINE (APRESOLINE) 50 MG tablet, Take 1.5 tablets (75 mg total) by mouth 3 (three) times daily., Disp: 540 tablet, Rfl: 2 ?  insulin glargine-yfgn (SEMGLEE, YFGN,) 100 UNIT/ML injection, Inject 0.08 mLs (8 Units total) into the skin 2 (two) times daily., Disp: 10 mL, Rfl: 11 ?  insulin regular (NOVOLIN R) 100 units/mL injection, Inject into the skin as needed for high blood sugar. As per sliding scale, Disp: , Rfl:  ?  insulin regular human CONCENTRATED (HUMULIN R U-500 KWIKPEN) 500 UNIT/ML KwikPen, Inject into the skin., Disp: , Rfl:  ?  isosorbide mononitrate (IMDUR) 30 MG 24 hr tablet, Take 1 tablet (30 mg total) by mouth daily., Disp: 30 tablet, Rfl: 11 ?  Magnesium Oxide 200 MG TABS, Take 2 tablets (400 mg total) by mouth daily., Disp: 60 tablet, Rfl: 11 ?  Multiple Vitamin (MULTIVITAMIN WITH MINERALS) TABS tablet, Take 1 tablet by mouth daily., Disp: , Rfl:  ?  sacubitril-valsartan (ENTRESTO) 24-26 MG, Take 1 tablet by mouth 2 (two) times daily., Disp: 60 tablet, Rfl: 6 ?  spironolactone (ALDACTONE) 25 MG tablet, TAKE HALF TABLET BY MOUTH DAILY, Disp: 30 tablet, Rfl: 0 ?  torsemide (DEMADEX) 20 MG tablet, Take 1 tablet (20 mg total) by mouth daily., Disp: 30 tablet, Rfl: 6 ?  zinc sulfate 220 (50 Zn) MG capsule, Take 1 capsule (220 mg total) by mouth daily., Disp: 30 capsule, Rfl: 0 ?Allergies  ?Allergen Reactions  ? Aspirin Itching  ? Codeine Hives  ? Coconut Oil Itching  ? ? ? ? ?Social History  ? ?Socioeconomic History  ? Marital status: Divorced  ?  Spouse name: Not on file  ? Number of children: Not on file  ? Years of education: Not on file  ? Highest education level: Not on file  ?Occupational History  ? Occupation: disability  ?  Comment: stopped working in 2000 d/t  occupational exposures  ?Tobacco Use  ? Smoking status: Former  ?  Types: Cigarettes  ? Smokeless tobacco: Never  ? Tobacco comments:  ?  "smoked when I drank"  ?Vaping Use  ? Vaping Use: Never used  ?Substance and Sexual Activity  ? Alcohol use: Not Currently  ?  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 Drug use: Never  ? Sexual activity: Not Currently  ?Other Topics Concern  ? Not on file  ?Social History Narrative  ? Patient lives at home with wife and some of his 10 children. He self-administers his own medications.  ? ?Social Determinants of Health  ? ?Financial Resource Strain: Low  Risk   ? Difficulty of Paying Living Expenses: Not hard at all  ?Food Insecurity: No Food Insecurity  ? Worried About Charity fundraiser in the Last Year: Never true  ? Ran Out of Food in the Last Year: Never true  ?Transportation Needs: Unmet Transportation Needs  ? Lack of Transportation (Medical): Yes  ? Lack of Transportation (Non-Medical): Yes  ?Physical Activity: Not on file  ?Stress: Not on file  ?Social Connections: Not on file  ?Intimate Partner Violence: Not on file  ? ? ?Physical Exam ? ? ? ? ? ?Future Appointments  ?Date Time Provider [place]  ?11/29/2021  8:10 AM [facility] DEVICE REMOTES [facility]  ?12/08/2021  1:15 PM Newt Minion, MD [facility] None  ?12/19/2021 10:00 AM [facility] PA/NP [facility] None  ?02/16/2022 12:00 PM Larey Dresser, MD [facility] None  ? ? ? ? ? ?[name], EMT-Paramedic ?[phone] ?Community Health Paramedic  ?11/04/21  ?

## 2021-11-10 ENCOUNTER — Other Ambulatory Visit (HOSPITAL_COMMUNITY): Payer: Self-pay | Admitting: Emergency Medicine

## 2021-11-10 NOTE — Progress Notes (Signed)
Paramedicine Encounter    Patient ID: Richard [name], male    DOB: [birth year], 61 y.o.   MRN: [id number]   BP 122/80 (BP Location: Left Arm, Patient Position: Sitting, Cuff Size: Normal)   Pulse 66   Resp 16  Weight yesterday-did not weigh Last visit weight-236 lbs  ATF Mr. Abraha doing well w/o complaint.  No chest pain or SOB.  Lung sounds clear and equal bilat.  No edema noted.  No JVD noted.  Reconciled medications in his pill box.  He was out of Zinc but states he will pick some up at the drug store.  Visit was slightly lengthy as Epic was down.  Mr. Cochren did not seem to be as depressed as he was my last visit and his wife has returned to the residence.  Patient Care Team: Rip Harbour as PCP - General (Internal Medicine) Larey Dresser, MD as PCP - Advanced Heart Failure (Cardiology) Deboraha Sprang, MD as PCP - Electrophysiology (Cardiology) Jorge Ny, LCSW as Social Worker (Licensed Clinical Social Worker)  Patient Active Problem List   Diagnosis Date Noted   Hyperlipidemia 09/09/2021   Wound dehiscence 03/02/2021   Subacute osteomyelitis, right ankle and foot ([place])    Sleep disturbance    Essential hypertension    Diabetic peripheral neuropathy ([place])    Labile blood glucose    Left below-knee amputee ([place]) 02/07/2021   PAD (peripheral artery disease) ([place]) 02/01/2021   Below-knee amputation of left lower extremity ([place]) 01/27/2021   S/P BKA (below knee amputation), left (HCC)    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[place])    SOB (shortness of breath)    Open wound    Bacteremia    Chronic ulcer of left ankle ([place])    Severe sepsis without septic shock (HCC)    COPD exacerbation ([place]) Q000111Q   Chronic systolic CHF (congestive heart failure) ([place]) 01/15/2021   Acute on chronic respiratory failure with hypoxia and hypercapnia ([place]) 01/15/2021   Acute renal failure superimposed on stage 3 chronic kidney disease ([place]) 01/15/2021   Elevated troponin  01/15/2021   Hyponatremia 01/15/2021   CHF exacerbation ([place]) 10/17/2020   Acute on chronic systolic CHF (congestive heart failure) ([place]) 10/16/2020   Acute on chronic combined systolic and diastolic CHF (congestive heart failure) ([place]) 03/15/2018   S/P right and left heart catheterization 11/02/2017   Panic attacks 11/02/2017   Non-STEMI (non-ST elevated myocardial infarction) ([place])    COPD (chronic obstructive pulmonary disease) ([place])    Hypertension    CKD (chronic kidney disease)    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[place])    Presence of implantable cardioverter-defibrillator (ICD)    GERD (gastroesophageal reflux disease)    Diabetes mellitus with diabetic neuropathy, with long-term current use of insulin ([place])     Current Outpatient Medications:    allopurinol (ZYLOPRIM) 100 MG tablet, Take 1 tablet (100 mg total) by mouth daily., Disp: 30 tablet, Rfl: 3   aspirin 81 MG chewable tablet, Chew 81 mg by mouth daily., Disp: , Rfl:    atorvastatin (LIPITOR) 40 MG tablet, TAKE ONE TABLET BY MOUTH DAILY, Disp: 90 tablet, Rfl: 3   bisoprolol (ZEBETA) 5 MG tablet, Take 0.5 tablets (2.5 mg total) by mouth daily., Disp: 30 tablet, Rfl: 0   clopidogrel (PLAVIX) 75 MG tablet, TAKE ONE TABLET BY MOUTH DAILY, Disp: 30 tablet, Rfl: 11   digoxin (LANOXIN) 0.125 MG tablet, Take 1 tablet (0.125 mg total) by mouth daily., Disp:  60 tablet, Rfl: 3   docusate sodium (COLACE) 100 MG capsule, Take 1 capsule (100 mg total) by mouth daily., Disp: 10 capsule, Rfl: 0   DULoxetine (CYMBALTA) 60 MG capsule, Take 1 capsule (60 mg total) by mouth daily., Disp: 30 capsule, Rfl: 2   empagliflozin (JARDIANCE) 25 MG TABS tablet, Take 25 mg by mouth daily., Disp: , Rfl:    fenofibrate (TRICOR) 145 MG tablet, Take 145 mg by mouth daily., Disp: , Rfl:    gabapentin (NEURONTIN) 400 MG capsule, Take 400 mg by mouth 3 (three) times daily., Disp: , Rfl:    hydrALAZINE (APRESOLINE) 50 MG tablet, Take 1.5 tablets (75 mg total) by mouth  3 (three) times daily., Disp: 540 tablet, Rfl: 2   insulin glargine-yfgn (SEMGLEE, YFGN,) 100 UNIT/ML injection, Inject 0.08 mLs (8 Units total) into the skin 2 (two) times daily., Disp: 10 mL, Rfl: 11   insulin regular (NOVOLIN R) 100 units/mL injection, Inject into the skin as needed for high blood sugar. As per sliding scale, Disp: , Rfl:    insulin regular human CONCENTRATED (HUMULIN R U-500 KWIKPEN) 500 UNIT/ML KwikPen, Inject into the skin., Disp: , Rfl:    isosorbide mononitrate (IMDUR) 30 MG 24 hr tablet, Take 1 tablet (30 mg total) by mouth daily., Disp: 30 tablet, Rfl: 11   Magnesium Oxide 200 MG TABS, Take 2 tablets (400 mg total) by mouth daily., Disp: 60 tablet, Rfl: 11   Multiple Vitamin (MULTIVITAMIN WITH MINERALS) TABS tablet, Take 1 tablet by mouth daily., Disp: , Rfl:    sacubitril-valsartan (ENTRESTO) 24-26 MG, Take 1 tablet by mouth 2 (two) times daily., Disp: 60 tablet, Rfl: 6   spironolactone (ALDACTONE) 25 MG tablet, TAKE HALF TABLET BY MOUTH DAILY, Disp: 30 tablet, Rfl: 0   torsemide (DEMADEX) 20 MG tablet, Take 1 tablet (20 mg total) by mouth daily., Disp: 30 tablet, Rfl: 6   albuterol (VENTOLIN HFA) 108 (90 Base) MCG/ACT inhaler, Inhale 1-2 puffs into the lungs every 4 (four) hours as needed for wheezing or shortness of breath. (Patient not taking: Reported on 11/10/2021), Disp: 8 g, Rfl: 1   Fluticasone Furoate-Vilanterol (BREO ELLIPTA IN), Inhale into the lungs daily as needed. (Patient not taking: Reported on 11/10/2021), Disp: , Rfl:    zinc sulfate 220 (50 Zn) MG capsule, Take 1 capsule (220 mg total) by mouth daily. (Patient not taking: Reported on 11/10/2021), Disp: 30 capsule, Rfl: 0 Allergies  Allergen Reactions   Aspirin Itching   Codeine Hives   Coconut (Cocos Nucifera) Itching      Social History   Socioeconomic History   Marital status: Divorced    Spouse name: Not on file   Number of children: Not on file   Years of education: Not on file   Highest  education level: Not on file  Occupational History   Occupation: disability    Comment: stopped working in 2000 d/t occupational exposures  Tobacco Use   Smoking status: Former    Types: Cigarettes   Smokeless tobacco: Never   Tobacco comments:    "smoked when I drank"  Vaping Use   Vaping Use: Never used  Substance and Sexual Activity   Alcohol use: Not Currently    Comment: 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  Other Topics Concern   Not on file  Social History Narrative   Patient lives at home with wife and some of  his 10 children. He self-administers his own medications.   Social Determinants of Health   Financial Resource Strain: Low Risk    Difficulty of Paying Living Expenses: Not hard at all  Food Insecurity: No Food Insecurity   Worried About Charity fundraiser in the Last Year: Never true   Ran Out of Food in the Last Year: Never true  Transportation Needs: Unmet Transportation Needs   Lack of Transportation (Medical): Yes   Lack of Transportation (Non-Medical): Yes  Physical Activity: Not on file  Stress: Not on file  Social Connections: Not on file  Intimate Partner Violence: Not on file    Physical Exam      Future Appointments  Date Time Provider [place]  11/29/2021  8:10 AM [facility] DEVICE REMOTES [facility]  12/08/2021  1:15 PM Newt Minion, MD [facility] None  12/19/2021 10:00 AM [facility] PA/NP [facility] None  02/16/2022 12:00 PM Larey Dresser, MD [place] None       [name], [place] [facility] Paramedic  11/10/21

## 2021-11-17 ENCOUNTER — Other Ambulatory Visit (HOSPITAL_COMMUNITY): Payer: Self-pay | Admitting: Emergency Medicine

## 2021-11-17 NOTE — Progress Notes (Signed)
Paramedicine Encounter    Patient ID: Richard [name], male    DOB: 12-21-1960, 61 y.o.   MRN: [id number]  ATF Mr. Leppanen in cheerful mood.  He has no complaints of chest pain or SOB.  Lung sounds clear and equal bilat.  No edema noted.  He only missed 1 or two doses of his meds this time and we discussed the importance of talking all his meds as directed.  Refills noted for pharmacy.  Med box refilled.  Needs Hydralazine Mon, Tue, Wed, Thurs.  [facility] will [facility].  BP 98/70 (BP Location: Left Arm, Patient Position: Sitting, Cuff Size: Normal)   Pulse 63   Resp 16   Wt 235 lb (106.6 kg)   SpO2 94%   BMI 27.87 kg/m  Weight yesterday-not weighed Last visit weight-236  Patient Care Team: Rip Harbour as PCP - General (Internal Medicine) Larey Dresser, MD as PCP - Advanced Heart Failure (Cardiology) Deboraha Sprang, MD as PCP - Electrophysiology (Cardiology) Jorge Ny, LCSW as Social Worker (Licensed Clinical Social Worker)  Patient Active Problem List   Diagnosis Date Noted   Hyperlipidemia 09/09/2021   Wound dehiscence 03/02/2021   Subacute osteomyelitis, right ankle and foot ([place])    Sleep disturbance    Essential hypertension    Diabetic peripheral neuropathy ([place])    Labile blood glucose    Left below-knee amputee ([place]) 02/07/2021   PAD (peripheral artery disease) ([place]) 02/01/2021   Below-knee amputation of left lower extremity ([place]) 01/27/2021   S/P BKA (below knee amputation), left ([place])    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[place])    SOB (shortness of breath)    Open wound    Bacteremia    Chronic ulcer of left ankle ([place])    Severe sepsis without septic shock ([place])    COPD exacerbation ([place]) Q000111Q   Chronic systolic CHF (congestive heart failure) ([place]) 01/15/2021   Acute on chronic respiratory failure with hypoxia and hypercapnia ([place]) 01/15/2021   Acute renal failure superimposed on stage 3 chronic kidney disease ([place]) 01/15/2021    Elevated troponin 01/15/2021   Hyponatremia 01/15/2021   CHF exacerbation ([place]) 10/17/2020   Acute on chronic systolic CHF (congestive heart failure) ([place]) 10/16/2020   Acute on chronic combined systolic and diastolic CHF (congestive heart failure) ([place]) 03/15/2018   S/P right and left heart catheterization 11/02/2017   Panic attacks 11/02/2017   Non-STEMI (non-ST elevated myocardial infarction) ([place])    COPD (chronic obstructive pulmonary disease) ([place])    Hypertension    CKD (chronic kidney disease)    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[place])    Presence of implantable cardioverter-defibrillator (ICD)    GERD (gastroesophageal reflux disease)    Diabetes mellitus with diabetic neuropathy, with long-term current use of insulin ([place])     Current Outpatient Medications:    albuterol (VENTOLIN HFA) 108 (90 Base) MCG/ACT inhaler, Inhale 1-2 puffs into the lungs every 4 (four) hours as needed for wheezing or shortness of breath. (Patient not taking: Reported on 11/10/2021), Disp: 8 g, Rfl: 1   allopurinol (ZYLOPRIM) 100 MG tablet, Take 1 tablet (100 mg total) by mouth daily., Disp: 30 tablet, Rfl: 3   aspirin 81 MG chewable tablet, Chew 81 mg by mouth daily., Disp: , Rfl:    atorvastatin (LIPITOR) 40 MG tablet, TAKE ONE TABLET BY MOUTH DAILY, Disp: 90 tablet, Rfl: 3   bisoprolol (ZEBETA) 5 MG tablet, Take 0.5 tablets (2.5 mg  total) by mouth daily., Disp: 30 tablet, Rfl: 0   clopidogrel (PLAVIX) 75 MG tablet, TAKE ONE TABLET BY MOUTH DAILY, Disp: 30 tablet, Rfl: 11   digoxin (LANOXIN) 0.125 MG tablet, Take 1 tablet (0.125 mg total) by mouth daily., Disp: 60 tablet, Rfl: 3   docusate sodium (COLACE) 100 MG capsule, Take 1 capsule (100 mg total) by mouth daily., Disp: 10 capsule, Rfl: 0   DULoxetine (CYMBALTA) 60 MG capsule, Take 1 capsule (60 mg total) by mouth daily., Disp: 30 capsule, Rfl: 2   empagliflozin (JARDIANCE) 25 MG TABS tablet, Take 25 mg by mouth daily., Disp: , Rfl:    fenofibrate  (TRICOR) 145 MG tablet, Take 145 mg by mouth daily., Disp: , Rfl:    Fluticasone Furoate-Vilanterol (BREO ELLIPTA IN), Inhale into the lungs daily as needed. (Patient not taking: Reported on 11/10/2021), Disp: , Rfl:    gabapentin (NEURONTIN) 400 MG capsule, Take 400 mg by mouth 3 (three) times daily., Disp: , Rfl:    hydrALAZINE (APRESOLINE) 50 MG tablet, Take 1.5 tablets (75 mg total) by mouth 3 (three) times daily., Disp: 540 tablet, Rfl: 2   insulin glargine-yfgn (SEMGLEE, YFGN,) 100 UNIT/ML injection, Inject 0.08 mLs (8 Units total) into the skin 2 (two) times daily., Disp: 10 mL, Rfl: 11   insulin regular (NOVOLIN R) 100 units/mL injection, Inject into the skin as needed for high blood sugar. As per sliding scale, Disp: , Rfl:    insulin regular human CONCENTRATED (HUMULIN R U-500 KWIKPEN) 500 UNIT/ML KwikPen, Inject into the skin., Disp: , Rfl:    isosorbide mononitrate (IMDUR) 30 MG 24 hr tablet, Take 1 tablet (30 mg total) by mouth daily., Disp: 30 tablet, Rfl: 11   Magnesium Oxide 200 MG TABS, Take 2 tablets (400 mg total) by mouth daily., Disp: 60 tablet, Rfl: 11   Multiple Vitamin (MULTIVITAMIN WITH MINERALS) TABS tablet, Take 1 tablet by mouth daily., Disp: , Rfl:    sacubitril-valsartan (ENTRESTO) 24-26 MG, Take 1 tablet by mouth 2 (two) times daily., Disp: 60 tablet, Rfl: 6   spironolactone (ALDACTONE) 25 MG tablet, TAKE HALF TABLET BY MOUTH DAILY, Disp: 30 tablet, Rfl: 0   torsemide (DEMADEX) 20 MG tablet, Take 1 tablet (20 mg total) by mouth daily., Disp: 30 tablet, Rfl: 6   zinc sulfate 220 (50 Zn) MG capsule, Take 1 capsule (220 mg total) by mouth daily. (Patient not taking: Reported on 11/10/2021), Disp: 30 capsule, Rfl: 0 Allergies  Allergen Reactions   Aspirin Itching   Codeine Hives   Coconut (Cocos Nucifera) Itching      Social History   Socioeconomic History   Marital status: Divorced    Spouse name: Not on file   Number of children: Not on file   Years of  education: Not on file   Highest education level: Not on file  Occupational History   Occupation: disability    Comment: stopped working in 2000 d/t occupational exposures  Tobacco Use   Smoking status: Former    Types: Cigarettes   Smokeless tobacco: Never   Tobacco comments:    "smoked when I drank"  Vaping Use   Vaping Use: Never used  Substance and Sexual Activity   Alcohol use: Not Currently    Comment: 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  Other Topics Concern   Not on file  Social History Narrative   Patient lives at home  with wife and some of his 10 children. He self-administers his own medications.   Social Determinants of Health   Financial Resource Strain: Low Risk    Difficulty of Paying Living Expenses: Not hard at all  Food Insecurity: No Food Insecurity   Worried About Charity fundraiser in the Last Year: Never true   Ran Out of Food in the Last Year: Never true  Transportation Needs: Unmet Transportation Needs   Lack of Transportation (Medical): Yes   Lack of Transportation (Non-Medical): Yes  Physical Activity: Not on file  Stress: Not on file  Social Connections: Not on file  Intimate Partner Violence: Not on file    Physical Exam      Future Appointments  Date Time Provider [place]  11/29/2021  8:10 AM [facility] DEVICE REMOTES [facility]  12/08/2021  1:15 PM Newt Minion, MD [facility] None  12/19/2021 10:00 AM [facility] PA/NP [facility] None  02/16/2022 12:00 PM Larey Dresser, MD [place] None       [name], [place] [facility] Paramedic  11/17/21

## 2021-11-17 NOTE — Progress Notes (Signed)
CREATED IN ERROR

## 2021-11-25 ENCOUNTER — Other Ambulatory Visit (HOSPITAL_COMMUNITY): Payer: Self-pay | Admitting: Emergency Medicine

## 2021-11-25 NOTE — Progress Notes (Signed)
Paramedicine Encounter    Patient ID: Richard [name], male    DOB: [birth year], 61 y.o.   MRN: [id number]  Met with Richard [name] this morning.  He reports to be feeling well.   Denies any chest pain or SOB.  He was having a disagreement with his wife while I was there and was hypertensive 140/100.  Took a second pressure about 30 minutes later and it came down a little to 130/90.  He also had not taken his morning meds yet because he had just gotten up but took them while I was there.  Med box filled and refills called into [facility].  He had only missed one evening dose from his pill box. No chest pain or SOB.  Lung sounds clear and equal bilat.  No edema noted to his lower extremities.  He does complain of numbness in his hands for about 6 months.  He was previously wanting to only take his Gabapentin once a day and then today ask two take a second dose in the evening.  Richard [name] says he has no other needs today.  Follow up visit scheduled for next Thursday @ 10:00  BP 130/90 (BP Location: Left Arm, Patient Position: Sitting, Cuff Size: Normal)   Pulse 64   Resp 16   Wt 230 lb 6.4 oz (104.5 kg)   SpO2 95%   BMI 27.32 kg/m  CGG 177 Weight yesterday-234lb Last visit weight-235lb   Patient Care Team: Rip Harbour as PCP - General (Internal Medicine) Larey Dresser, MD as PCP - Advanced Heart Failure (Cardiology) Deboraha Sprang, MD as PCP - Electrophysiology (Cardiology) Jorge Ny, LCSW as Social Worker (Licensed Clinical Social Worker)  Patient Active Problem List   Diagnosis Date Noted   Hyperlipidemia 09/09/2021   Wound dehiscence 03/02/2021   Subacute osteomyelitis, right ankle and foot ([place])    Sleep disturbance    Essential hypertension    Diabetic peripheral neuropathy ([place])    Labile blood glucose    Left below-knee amputee ([place]) 02/07/2021   PAD (peripheral artery disease) ([place]) 02/01/2021   Below-knee amputation of left lower extremity ([place]) 01/27/2021    S/P BKA (below knee amputation), left ([place])    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[place])    SOB (shortness of breath)    Open wound    Bacteremia    Chronic ulcer of left ankle ([place])    Severe sepsis without septic shock (HCC)    COPD exacerbation ([place]) 84/69/6295   Chronic systolic CHF (congestive heart failure) ([place]) 01/15/2021   Acute on chronic respiratory failure with hypoxia and hypercapnia ([place]) 01/15/2021   Acute renal failure superimposed on stage 3 chronic kidney disease ([place]) 01/15/2021   Elevated troponin 01/15/2021   Hyponatremia 01/15/2021   CHF exacerbation ([place]) 10/17/2020   Acute on chronic systolic CHF (congestive heart failure) ([place]) 10/16/2020   Acute on chronic combined systolic and diastolic CHF (congestive heart failure) ([place]) 03/15/2018   S/P right and left heart catheterization 11/02/2017   Panic attacks 11/02/2017   Non-STEMI (non-ST elevated myocardial infarction) ([place])    COPD (chronic obstructive pulmonary disease) ([place])    Hypertension    CKD (chronic kidney disease)    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[place])    Presence of implantable cardioverter-defibrillator (ICD)    GERD (gastroesophageal reflux disease)    Diabetes mellitus with diabetic neuropathy, with long-term current use of insulin ([place])     Current Outpatient Medications:  allopurinol (ZYLOPRIM) 100 MG tablet, Take 1 tablet (100 mg total) by mouth daily., Disp: 30 tablet, Rfl: 3   aspirin 81 MG chewable tablet, Chew 81 mg by mouth daily., Disp: , Rfl:    atorvastatin (LIPITOR) 40 MG tablet, TAKE ONE TABLET BY MOUTH DAILY, Disp: 90 tablet, Rfl: 3   bisoprolol (ZEBETA) 5 MG tablet, Take 0.5 tablets (2.5 mg total) by mouth daily., Disp: 30 tablet, Rfl: 0   clopidogrel (PLAVIX) 75 MG tablet, TAKE ONE TABLET BY MOUTH DAILY, Disp: 30 tablet, Rfl: 11   digoxin (LANOXIN) 0.125 MG tablet, Take 1 tablet (0.125 mg total) by mouth daily., Disp: 60 tablet, Rfl: 3   docusate sodium (COLACE) 100 MG capsule,  Take 1 capsule (100 mg total) by mouth daily., Disp: 10 capsule, Rfl: 0   DULoxetine (CYMBALTA) 60 MG capsule, Take 1 capsule (60 mg total) by mouth daily., Disp: 30 capsule, Rfl: 2   empagliflozin (JARDIANCE) 25 MG TABS tablet, Take 25 mg by mouth daily., Disp: , Rfl:    fenofibrate (TRICOR) 145 MG tablet, Take 145 mg by mouth daily., Disp: , Rfl:    gabapentin (NEURONTIN) 400 MG capsule, Take 400 mg by mouth 3 (three) times daily., Disp: , Rfl:    hydrALAZINE (APRESOLINE) 50 MG tablet, Take 1.5 tablets (75 mg total) by mouth 3 (three) times daily., Disp: 540 tablet, Rfl: 2   insulin glargine-yfgn (SEMGLEE, YFGN,) 100 UNIT/ML injection, Inject 0.08 mLs (8 Units total) into the skin 2 (two) times daily., Disp: 10 mL, Rfl: 11   insulin regular (NOVOLIN R) 100 units/mL injection, Inject into the skin as needed for high blood sugar. As per sliding scale, Disp: , Rfl:    insulin regular human CONCENTRATED (HUMULIN R U-500 KWIKPEN) 500 UNIT/ML KwikPen, Inject into the skin., Disp: , Rfl:    isosorbide mononitrate (IMDUR) 30 MG 24 hr tablet, Take 1 tablet (30 mg total) by mouth daily., Disp: 30 tablet, Rfl: 11   Magnesium Oxide 200 MG TABS, Take 2 tablets (400 mg total) by mouth daily., Disp: 60 tablet, Rfl: 11   Multiple Vitamin (MULTIVITAMIN WITH MINERALS) TABS tablet, Take 1 tablet by mouth daily., Disp: , Rfl:    spironolactone (ALDACTONE) 25 MG tablet, TAKE HALF TABLET BY MOUTH DAILY, Disp: 30 tablet, Rfl: 0   torsemide (DEMADEX) 20 MG tablet, Take 1 tablet (20 mg total) by mouth daily., Disp: 30 tablet, Rfl: 6   zinc sulfate 220 (50 Zn) MG capsule, Take 1 capsule (220 mg total) by mouth daily., Disp: 30 capsule, Rfl: 0   albuterol (VENTOLIN HFA) 108 (90 Base) MCG/ACT inhaler, Inhale 1-2 puffs into the lungs every 4 (four) hours as needed for wheezing or shortness of breath. (Patient not taking: Reported on 11/10/2021), Disp: 8 g, Rfl: 1   Fluticasone Furoate-Vilanterol (BREO ELLIPTA IN), Inhale into  the lungs daily as needed. (Patient not taking: Reported on 11/10/2021), Disp: , Rfl:    sacubitril-valsartan (ENTRESTO) 24-26 MG, Take 1 tablet by mouth 2 (two) times daily., Disp: 60 tablet, Rfl: 6 Allergies  Allergen Reactions   Aspirin Itching   Codeine Hives   Coconut (Cocos Nucifera) Itching      Social History   Socioeconomic History   Marital status: Divorced    Spouse name: Not on file   Number of children: Not on file   Years of education: Not on file   Highest education level: Not on file  Occupational History   Occupation: disability    Comment: stopped  working in 2000 d/t occupational exposures  Tobacco Use   Smoking status: Former    Types: Cigarettes   Smokeless tobacco: Never   Tobacco comments:    "smoked when I drank"  Vaping Use   Vaping Use: Never used  Substance and Sexual Activity   Alcohol use: Not Currently    Comment: 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  Other Topics Concern   Not on file  Social History Narrative   Patient lives at home with wife and some of his 10 children. He self-administers his own medications.   Social Determinants of Health   Financial Resource Strain: Low Risk    Difficulty of Paying Living Expenses: Not hard at all  Food Insecurity: No Food Insecurity   Worried About Charity fundraiser in the Last Year: Never true   Ran Out of Food in the Last Year: Never true  Transportation Needs: Unmet Transportation Needs   Lack of Transportation (Medical): Yes   Lack of Transportation (Non-Medical): Yes  Physical Activity: Not on file  Stress: Not on file  Social Connections: Not on file  Intimate Partner Violence: Not on file    Physical Exam      Future Appointments  Date Time Provider [place]  11/29/2021  8:10 AM [facility] DEVICE REMOTES [facility]  12/08/2021  1:15 PM Newt Minion, MD [facility] None  12/19/2021 10:00 AM  [facility] PA/NP [facility] None  02/16/2022 12:00 PM Larey Dresser, MD [place] None       [name], [place] [facility] Paramedic  11/25/21 23

## 2021-11-29 ENCOUNTER — Ambulatory Visit (INDEPENDENT_AMBULATORY_CARE_PROVIDER_SITE_OTHER): Payer: Medicare Other

## 2021-11-29 DIAGNOSIS — I428 Other cardiomyopathies: Secondary | ICD-10-CM

## 2021-11-29 LAB — CUP PACEART REMOTE DEVICE CHECK
Battery Remaining Longevity: 58 mo
Battery Voltage: 2.98 V
Brady Statistic AP VP Percent: 0 %
Brady Statistic AP VS Percent: 0.47 %
Brady Statistic AS VP Percent: 0.07 %
Brady Statistic AS VS Percent: 99.46 %
Brady Statistic RA Percent Paced: 0.47 %
Brady Statistic RV Percent Paced: 0.07 %
Date Time Interrogation Session: 20230606043725
HighPow Impedance: 72 Ohm
Implantable Lead Implant Date: 20180719
Implantable Lead Implant Date: 20180719
Implantable Lead Location: 753859
Implantable Lead Location: 753860
Implantable Lead Model: 5076
Implantable Pulse Generator Implant Date: 20180719
Lead Channel Impedance Value: 304 Ohm
Lead Channel Impedance Value: 399 Ohm
Lead Channel Impedance Value: 399 Ohm
Lead Channel Pacing Threshold Amplitude: 0.875 V
Lead Channel Pacing Threshold Amplitude: 1 V
Lead Channel Pacing Threshold Pulse Width: 0.4 ms
Lead Channel Pacing Threshold Pulse Width: 0.4 ms
Lead Channel Sensing Intrinsic Amplitude: 4.875 mV
Lead Channel Sensing Intrinsic Amplitude: 4.875 mV
Lead Channel Sensing Intrinsic Amplitude: 6.625 mV
Lead Channel Sensing Intrinsic Amplitude: 6.625 mV
Lead Channel Setting Pacing Amplitude: 2 V
Lead Channel Setting Pacing Amplitude: 2 V
Lead Channel Setting Pacing Pulse Width: 0.4 ms
Lead Channel Setting Sensing Sensitivity: 0.3 mV

## 2021-12-01 ENCOUNTER — Telehealth (HOSPITAL_COMMUNITY): Payer: Self-pay | Admitting: Emergency Medicine

## 2021-12-01 ENCOUNTER — Other Ambulatory Visit (HOSPITAL_COMMUNITY): Payer: Self-pay | Admitting: Emergency Medicine

## 2021-12-01 ENCOUNTER — Telehealth (HOSPITAL_COMMUNITY): Payer: Self-pay | Admitting: *Deleted

## 2021-12-01 DIAGNOSIS — I5022 Chronic systolic (congestive) heart failure: Secondary | ICD-10-CM

## 2021-12-01 NOTE — Telephone Encounter (Signed)
Received call from Kevan Rosebush who advised per Marlyce Huge, PA for Richard [name] to take 40mg . Torsemide (2 - 20mg . Tablets) for the next 5 days which will take him through Monday and on Tuesday to go back to 20mg . Torsemide daily.  I spoke with Richard [name] and he advised he understood the directions.  He says he has done this in the past and has no questions about doing it this time.  I also advised him if he began to experience more increase in swelling or SOB to reach out to the HF clinic or call 911.

## 2021-12-01 NOTE — Telephone Encounter (Signed)
[name] aware, she will advise pt, labs sch for 6/16

## 2021-12-01 NOTE — Telephone Encounter (Signed)
[name] is out seeing pt today and reports wt is up 8 lbs from last wk (230-->238lbs), abd is dist, BP 150/100, decreased urinary output, denies sob. Per [name] pt and wife have been arguing all morning and pt is agitated. He reports eating fried chicken with gravy and ramen noodles for breakfast today, he also admits to drinking a lot more fluids, CBG has been around 120s, although today it is 398 (after eating). He has been compliant with all meds including entresto 24/26, spiro 25, jardiance 10, and torsemide 20 mg daily, although hasn't taken any meds yet this morning. Pt will take Torsemide 40 mg now, will discuss with provider and call [name] back later today with further instructions, she is discussing/educating on salt/fluid with patient again.

## 2021-12-01 NOTE — Telephone Encounter (Signed)
Increase torsemide to 40 mg daily X 5 days then reduce back to 20 mg daily. Get BMET in 10 days.

## 2021-12-01 NOTE — Progress Notes (Signed)
Chicken, gravy, ramen noodles  Coffee, and soda [id number] His meter 398 Paramedicine Encounter    Patient ID: Richard [name], male    DOB: 17-Mar-1961, 61 y.o.   MRN: [id number]  Met with [name] today.  He denies chest pain or SOB and his lung sounds are clear throughout.  His blood pressure is up which is unusual since I've been seeing him.  He denies headaches or visual disturbances.  He has done well in taking his meds as prescribed only missing 2 doses of Hydralazine out of the whole week and he takes it 3 times a day.  I noted that his abdomen looked swollen and felt tight.  He states, "this is where I hold my fluid."  He is up 8 lbs from last visit and his CBG is 402.  Pt. Reports that he ate fried chicken and gravy, a pack of Ramen Noodles, coffee with sugar and a soft drink.  We discussed how these poor nutritional choices impact is vital signs and how to make better choices in the future.  I contacted clinic speaking with Richard [name] who advised for him to take 1 extra Torsemide this morning and that she would check with Richard Huge PA regarding further medication changes.   Richard [name] for refills on his Clopidogrel, Bisoprolol, Fenofibrate, Jardiance, Spironolactone and Isosorbide and was advised that these meds will be delivered this afternoon.     BP (!) 150/100 (BP Location: Left Arm, Patient Position: Sitting, Cuff Size: Normal)   Pulse 83   Wt 238 lb 9.6 oz (108.2 kg)   BMI 28.29 kg/m  Weight yesterday-did not weigh Last visit weight-230lb  Patient Care Team: Rip Harbour as PCP - General (Internal Medicine) Larey Dresser, MD as PCP - Advanced Heart Failure (Cardiology) Deboraha Sprang, MD as PCP - Electrophysiology (Cardiology) Jorge Ny, LCSW as Social Worker (Licensed Clinical Social Worker)  Patient Active Problem List   Diagnosis Date Noted   Hyperlipidemia 09/09/2021   Wound dehiscence 03/02/2021   Subacute osteomyelitis, right  ankle and foot ([place])    Sleep disturbance    Essential hypertension    Diabetic peripheral neuropathy ([place])    Labile blood glucose    Left below-knee amputee ([place]) 02/07/2021   PAD (peripheral artery disease) ([place]) 02/01/2021   Below-knee amputation of left lower extremity ([place]) 01/27/2021   S/P BKA (below knee amputation), left (HCC)    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[place])    SOB (shortness of breath)    Open wound    Bacteremia    Chronic ulcer of left ankle ([place])    Severe sepsis without septic shock ([place])    COPD exacerbation ([place]) 80/99/8338   Chronic systolic CHF (congestive heart failure) ([place]) 01/15/2021   Acute on chronic respiratory failure with hypoxia and hypercapnia ([place]) 01/15/2021   Acute renal failure superimposed on stage 3 chronic kidney disease ([place]) 01/15/2021   Elevated troponin 01/15/2021   Hyponatremia 01/15/2021   CHF exacerbation ([place]) 10/17/2020   Acute on chronic systolic CHF (congestive heart failure) ([place]) 10/16/2020   Acute on chronic combined systolic and diastolic CHF (congestive heart failure) ([place]) 03/15/2018   S/P right and left heart catheterization 11/02/2017   Panic attacks 11/02/2017   Non-STEMI (non-ST elevated myocardial infarction) ([place])    COPD (chronic obstructive pulmonary disease) ([place])    Hypertension    CKD (chronic kidney disease)    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[place])  Presence of implantable cardioverter-defibrillator (ICD)    GERD (gastroesophageal reflux disease)    Diabetes mellitus with diabetic neuropathy, with long-term current use of insulin (HCC)     Current Outpatient Medications:    allopurinol (ZYLOPRIM) 100 MG tablet, Take 1 tablet (100 mg total) by mouth daily., Disp: 30 tablet, Rfl: 3   aspirin 81 MG chewable tablet, Chew 81 mg by mouth daily., Disp: , Rfl:    atorvastatin (LIPITOR) 40 MG tablet, TAKE ONE TABLET BY MOUTH DAILY, Disp: 90 tablet, Rfl: 3   bisoprolol (ZEBETA) 5 MG tablet, Take 0.5 tablets (2.5 mg  total) by mouth daily., Disp: 30 tablet, Rfl: 0   clopidogrel (PLAVIX) 75 MG tablet, TAKE ONE TABLET BY MOUTH DAILY, Disp: 30 tablet, Rfl: 11   digoxin (LANOXIN) 0.125 MG tablet, Take 1 tablet (0.125 mg total) by mouth daily., Disp: 60 tablet, Rfl: 3   docusate sodium (COLACE) 100 MG capsule, Take 1 capsule (100 mg total) by mouth daily., Disp: 10 capsule, Rfl: 0   DULoxetine (CYMBALTA) 60 MG capsule, Take 1 capsule (60 mg total) by mouth daily., Disp: 30 capsule, Rfl: 2   empagliflozin (JARDIANCE) 25 MG TABS tablet, Take 25 mg by mouth daily., Disp: , Rfl:    fenofibrate (TRICOR) 145 MG tablet, Take 145 mg by mouth daily., Disp: , Rfl:    gabapentin (NEURONTIN) 400 MG capsule, Take 400 mg by mouth 3 (three) times daily., Disp: , Rfl:    hydrALAZINE (APRESOLINE) 50 MG tablet, Take 1.5 tablets (75 mg total) by mouth 3 (three) times daily., Disp: 540 tablet, Rfl: 2   insulin glargine-yfgn (SEMGLEE, YFGN,) 100 UNIT/ML injection, Inject 0.08 mLs (8 Units total) into the skin 2 (two) times daily., Disp: 10 mL, Rfl: 11   insulin regular (NOVOLIN R) 100 units/mL injection, Inject into the skin as needed for high blood sugar. As per sliding scale, Disp: , Rfl:    insulin regular human CONCENTRATED (HUMULIN R U-500 KWIKPEN) 500 UNIT/ML KwikPen, Inject into the skin., Disp: , Rfl:    isosorbide mononitrate (IMDUR) 30 MG 24 hr tablet, Take 1 tablet (30 mg total) by mouth daily., Disp: 30 tablet, Rfl: 11   Magnesium Oxide 200 MG TABS, Take 2 tablets (400 mg total) by mouth daily., Disp: 60 tablet, Rfl: 11   Multiple Vitamin (MULTIVITAMIN WITH MINERALS) TABS tablet, Take 1 tablet by mouth daily., Disp: , Rfl:    sacubitril-valsartan (ENTRESTO) 24-26 MG, Take 1 tablet by mouth 2 (two) times daily., Disp: 60 tablet, Rfl: 6   spironolactone (ALDACTONE) 25 MG tablet, TAKE HALF TABLET BY MOUTH DAILY, Disp: 30 tablet, Rfl: 0   torsemide (DEMADEX) 20 MG tablet, Take 1 tablet (20 mg total) by mouth daily., Disp: 30  tablet, Rfl: 6   albuterol (VENTOLIN HFA) 108 (90 Base) MCG/ACT inhaler, Inhale 1-2 puffs into the lungs every 4 (four) hours as needed for wheezing or shortness of breath. (Patient not taking: Reported on 11/10/2021), Disp: 8 g, Rfl: 1   Fluticasone Furoate-Vilanterol (BREO ELLIPTA IN), Inhale into the lungs daily as needed. (Patient not taking: Reported on 11/10/2021), Disp: , Rfl:    zinc sulfate 220 (50 Zn) MG capsule, Take 1 capsule (220 mg total) by mouth daily., Disp: 30 capsule, Rfl: 0 Allergies  Allergen Reactions   Aspirin Itching   Codeine Hives   Coconut (Cocos Nucifera) Itching      Social History   Socioeconomic History   Marital status: Divorced    Spouse name: Not on  file   Number of children: Not on file   Years of education: Not on file   Highest education level: Not on file  Occupational History   Occupation: disability    Comment: stopped working in 2000 d/t occupational exposures  Tobacco Use   Smoking status: Former    Types: Cigarettes   Smokeless tobacco: Never   Tobacco comments:    "smoked when I drank"  Vaping Use   Vaping Use: Never used  Substance and Sexual Activity   Alcohol use: Not Currently    Comment: 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  Other Topics Concern   Not on file  Social History Narrative   Patient lives at home with wife and some of his 10 children. He self-administers his own medications.   Social Determinants of Health   Financial Resource Strain: Low Risk  (09/22/2021)   Overall Financial Resource Strain (CARDIA)    Difficulty of Paying Living Expenses: Not hard at all  Food Insecurity: No Food Insecurity (09/22/2021)   Hunger Vital Sign    Worried About Running Out of Food in the Last Year: Never true    Ran Out of Food in the Last Year: Never true  Transportation Needs: Unmet Transportation Needs (09/22/2021)   PRAPARE - Radiographer, therapeutic (Medical): Yes    Lack of Transportation (Non-Medical): Yes  Physical Activity: Not on file  Stress: Not on file  Social Connections: Not on file  Intimate Partner Violence: Not on file    Physical Exam      Future Appointments  Date Time Provider [place]  12/08/2021  1:15 PM Newt Minion, MD [facility] None  12/09/2021 11:30 AM [facility] LAB [facility] None  12/19/2021 10:00 AM [facility] PA/NP [facility] None  02/16/2022 12:00 PM Larey Dresser, MD [place] None       [name], [place] [facility] Paramedic  12/01/21

## 2021-12-08 ENCOUNTER — Other Ambulatory Visit (HOSPITAL_COMMUNITY): Payer: Self-pay | Admitting: Emergency Medicine

## 2021-12-08 ENCOUNTER — Ambulatory Visit (INDEPENDENT_AMBULATORY_CARE_PROVIDER_SITE_OTHER): Payer: Medicare Other

## 2021-12-08 ENCOUNTER — Other Ambulatory Visit (HOSPITAL_COMMUNITY): Payer: Self-pay | Admitting: Cardiology

## 2021-12-08 ENCOUNTER — Ambulatory Visit (INDEPENDENT_AMBULATORY_CARE_PROVIDER_SITE_OTHER): Payer: Medicare Other | Admitting: Orthopedic Surgery

## 2021-12-08 DIAGNOSIS — Z89511 Acquired absence of right leg below knee: Secondary | ICD-10-CM

## 2021-12-08 DIAGNOSIS — Z89512 Acquired absence of left leg below knee: Secondary | ICD-10-CM | POA: Diagnosis not present

## 2021-12-08 DIAGNOSIS — I2510 Atherosclerotic heart disease of native coronary artery without angina pectoris: Secondary | ICD-10-CM | POA: Diagnosis not present

## 2021-12-08 DIAGNOSIS — M542 Cervicalgia: Secondary | ICD-10-CM

## 2021-12-08 NOTE — Progress Notes (Signed)
Paramedicine Encounter    Patient ID: Richard [name], male    DOB: 07/23/1960, 61 y.o.   MRN: [id number]   BP (!) 140/100 (BP Location: Right Arm, Patient Position: Sitting, Cuff Size: Normal)   Wt 233 lb 1.6 oz (105.7 kg)   BMI 27.64 kg/m  Weight yesterday didn't weigh yesterday Last visit weight-238lb  [id number]  Met with Richard [name] this morning.  He reports to be doing well.  No complaints of chest pain or SOB.  He is down 5lbs in his weight.  He says he can tell his weight is down and feels much better than my last visit.  Lung sounds clear and equal bilat.  He has done well with his medication compliance.  Pill box refilled.  Called for refill to [facility] for his Bisoprolol and it should be delivered today.  He will put 1/2 tablet in Sun-Wed to complete his pill box.  He has some complaint of pains in his left hand and thinks it could be Gout.  Told him to call his PCP for same.  Will have another home visit next Thursday @ 9:00   Patient Care Team: Robert Bellow, PA-C as PCP - General (Internal Medicine) Larey Dresser, MD as PCP - Advanced Heart Failure (Cardiology) Deboraha Sprang, MD as PCP - Electrophysiology (Cardiology) Jorge Ny, LCSW as Social Worker (Licensed Clinical Social Worker)  Patient Active Problem List   Diagnosis Date Noted   Hyperlipidemia 09/09/2021   Wound dehiscence 03/02/2021   Subacute osteomyelitis, right ankle and foot ([place])    Sleep disturbance    Essential hypertension    Diabetic peripheral neuropathy ([place])    Labile blood glucose    Left below-knee amputee ([place]) 02/07/2021   PAD (peripheral artery disease) ([place]) 02/01/2021   Below-knee amputation of left lower extremity ([place]) 01/27/2021   S/P BKA (below knee amputation), left ([place])    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[place])    SOB (shortness of breath)    Open wound    Bacteremia    Chronic ulcer of left ankle ([place])    Severe sepsis without septic shock ([place])    COPD exacerbation  ([place]) 95/02/3266   Chronic systolic CHF (congestive heart failure) ([place]) 01/15/2021   Acute on chronic respiratory failure with hypoxia and hypercapnia ([place]) 01/15/2021   Acute renal failure superimposed on stage 3 chronic kidney disease ([place]) 01/15/2021   Elevated troponin 01/15/2021   Hyponatremia 01/15/2021   CHF exacerbation ([place]) 10/17/2020   Acute on chronic systolic CHF (congestive heart failure) ([place]) 10/16/2020   Acute on chronic combined systolic and diastolic CHF (congestive heart failure) ([place]) 03/15/2018   S/P right and left heart catheterization 11/02/2017   Panic attacks 11/02/2017   Non-STEMI (non-ST elevated myocardial infarction) ([place])    COPD (chronic obstructive pulmonary disease) ([place])    Hypertension    CKD (chronic kidney disease)    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[place])    Presence of implantable cardioverter-defibrillator (ICD)    GERD (gastroesophageal reflux disease)    Diabetes mellitus with diabetic neuropathy, with long-term current use of insulin ([place])     Current Outpatient Medications:    allopurinol (ZYLOPRIM) 100 MG tablet, Take 1 tablet (100 mg total) by mouth daily., Disp: 30 tablet, Rfl: 3   aspirin 81 MG chewable tablet, Chew 81 mg by mouth daily., Disp: , Rfl:    atorvastatin (LIPITOR) 40 MG tablet, TAKE ONE TABLET BY MOUTH DAILY, Disp: 90 tablet,  Rfl: 3   clopidogrel (PLAVIX) 75 MG tablet, TAKE ONE TABLET BY MOUTH DAILY, Disp: 30 tablet, Rfl: 11   digoxin (LANOXIN) 0.125 MG tablet, Take 1 tablet (0.125 mg total) by mouth daily., Disp: 60 tablet, Rfl: 3   docusate sodium (COLACE) 100 MG capsule, Take 1 capsule (100 mg total) by mouth daily., Disp: 10 capsule, Rfl: 0   DULoxetine (CYMBALTA) 60 MG capsule, Take 1 capsule (60 mg total) by mouth daily., Disp: 30 capsule, Rfl: 2   empagliflozin (JARDIANCE) 25 MG TABS tablet, Take 25 mg by mouth daily., Disp: , Rfl:    fenofibrate (TRICOR) 145 MG tablet, Take 145 mg by mouth daily., Disp: , Rfl:    gabapentin  (NEURONTIN) 400 MG capsule, Take 400 mg by mouth 3 (three) times daily., Disp: , Rfl:    hydrALAZINE (APRESOLINE) 50 MG tablet, Take 1.5 tablets (75 mg total) by mouth 3 (three) times daily., Disp: 540 tablet, Rfl: 2   insulin glargine-yfgn (SEMGLEE, YFGN,) 100 UNIT/ML injection, Inject 0.08 mLs (8 Units total) into the skin 2 (two) times daily., Disp: 10 mL, Rfl: 11   insulin regular (NOVOLIN R) 100 units/mL injection, Inject into the skin as needed for high blood sugar. As per sliding scale, Disp: , Rfl:    insulin regular human CONCENTRATED (HUMULIN R U-500 KWIKPEN) 500 UNIT/ML KwikPen, Inject into the skin., Disp: , Rfl:    isosorbide mononitrate (IMDUR) 30 MG 24 hr tablet, Take 1 tablet (30 mg total) by mouth daily., Disp: 30 tablet, Rfl: 11   Magnesium Oxide 200 MG TABS, Take 2 tablets (400 mg total) by mouth daily., Disp: 60 tablet, Rfl: 11   Multiple Vitamin (MULTIVITAMIN WITH MINERALS) TABS tablet, Take 1 tablet by mouth daily., Disp: , Rfl:    sacubitril-valsartan (ENTRESTO) 24-26 MG, Take 1 tablet by mouth 2 (two) times daily., Disp: 60 tablet, Rfl: 6   spironolactone (ALDACTONE) 25 MG tablet, TAKE HALF TABLET BY MOUTH DAILY, Disp: 30 tablet, Rfl: 0   torsemide (DEMADEX) 20 MG tablet, Take 1 tablet (20 mg total) by mouth daily., Disp: 30 tablet, Rfl: 6   zinc sulfate 220 (50 Zn) MG capsule, Take 1 capsule (220 mg total) by mouth daily., Disp: 30 capsule, Rfl: 0   albuterol (VENTOLIN HFA) 108 (90 Base) MCG/ACT inhaler, Inhale 1-2 puffs into the lungs every 4 (four) hours as needed for wheezing or shortness of breath. (Patient not taking: Reported on 11/10/2021), Disp: 8 g, Rfl: 1   bisoprolol (ZEBETA) 5 MG tablet, Take 0.5 tablets (2.5 mg total) by mouth daily., Disp: 30 tablet, Rfl: 0   Fluticasone Furoate-Vilanterol (BREO ELLIPTA IN), Inhale into the lungs daily as needed. (Patient not taking: Reported on 11/10/2021), Disp: , Rfl:  Allergies  Allergen Reactions   Aspirin Itching   Codeine  Hives   Coconut (Cocos Nucifera) Itching      Social History   Socioeconomic History   Marital status: Divorced    Spouse name: Not on file   Number of children: Not on file   Years of education: Not on file   Highest education level: Not on file  Occupational History   Occupation: disability    Comment: stopped working in 2000 d/t occupational exposures  Tobacco Use   Smoking status: Former    Types: Cigarettes   Smokeless tobacco: Never   Tobacco comments:    "smoked when I drank"  Vaping Use   Vaping Use: Never used  Substance and Sexual Activity   Alcohol  use: Not Currently    Comment: 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  Other Topics Concern   Not on file  Social History Narrative   Patient lives at home with wife and some of his 10 children. He self-administers his own medications.   Social Determinants of Health   Financial Resource Strain: Low Risk  (09/22/2021)   Overall Financial Resource Strain (CARDIA)    Difficulty of Paying Living Expenses: Not hard at all  Food Insecurity: No Food Insecurity (09/22/2021)   Hunger Vital Sign    Worried About Running Out of Food in the Last Year: Never true    Ran Out of Food in the Last Year: Never true  Transportation Needs: Unmet Transportation Needs (09/22/2021)   PRAPARE - Hydrologist (Medical): Yes    Lack of Transportation (Non-Medical): Yes  Physical Activity: Not on file  Stress: Not on file  Social Connections: Not on file  Intimate Partner Violence: Not on file    Physical Exam      Future Appointments  Date Time Provider [place]  12/08/2021  1:15 PM Newt Minion, MD [facility] None  12/09/2021 11:30 AM [facility] LAB [facility] None  12/19/2021 10:00 AM [facility] PA/NP [facility] None  02/16/2022 12:00 PM Larey Dresser, MD [place] None       [name], [place] [facility]  Paramedic  12/08/21

## 2021-12-09 ENCOUNTER — Other Ambulatory Visit (HOSPITAL_COMMUNITY): Payer: Medicare Other

## 2021-12-11 ENCOUNTER — Encounter: Payer: Self-pay | Admitting: Orthopedic Surgery

## 2021-12-11 NOTE — Progress Notes (Signed)
Office Visit Note   Patient: Richard [name]           Date of Birth: 1961/01/24           MRN: [id number] Visit Date: 12/08/2021              Requested by: [name] Catalina, PA-C [id number] [place],  [place] [address] PCP: [name] Catalina, PA-C  Chief Complaint  Patient presents with   Right Leg - Follow-up    Hx right BKA 03/02/2021   Left Leg - Follow-up    Hx BKA 01/21/2021      HPI: Patient is a 61 year old gentleman who is status post bilateral transtibial amputations last year patient states he has been subsiding into the socket with associated pain.  Patient states he has also been having weakness with elbow flexion and grip strength in the left hand.  Patient denies any specific trauma denies any recent fever or chills.  Patient denies any neck pain or radicular symptoms.  Assessment & Plan: Visit Diagnoses:  1. Neck pain   2. S/P BKA (below knee amputation), right (HCC)   3. S/P BKA (below knee amputation), left (HCC)     Plan: We will have patient follow-up with Dr. Alvester Morin for nerve conduction studies to the left upper extremity.  Follow-Up Instructions: Return if symptoms worsen or fail to improve.   Ortho Exam  Patient is alert, oriented, no adenopathy, well-dressed, normal affect, normal respiratory effort. Examination patient has decreased residual volume in the residual limbs and has had modifications to the sockets and wearing extra ply sock will need new socket and liners.  Examination of the left upper extremity the biceps tendon is intact there is no pain of the long head of the biceps no pain at the insertion of the biceps patient has weakness with elbow flexion.  Patient has atrophy of the muscle in the first webspace.  Patient has weakness with grip strength on the left he has good extension strength.  Imaging: No results found. No images are attached to the encounter.  Labs: Lab Results  Component Value Date   HGBA1C 10.3 (H)  01/17/2021   HGBA1C 10.4 (H) 10/31/2017   LABURIC 8.2 09/22/2019   REPTSTATUS 01/22/2021 FINAL 01/16/2021   CULT  01/16/2021    NO GROWTH 6 DAYS Performed at [facility], [address] [place], [place] [address]    [facility] STREPTOCOCCUS GROUP G 01/15/2021     Lab Results  Component Value Date   ALBUMIN 3.5 09/22/2021   ALBUMIN 1.8 (L) 02/08/2021   ALBUMIN 1.7 (L) 01/28/2021   PREALBUMIN 8.5 (L) 01/16/2021    Lab Results  Component Value Date   MG 2.0 09/22/2021   MG 2.7 (H) 01/16/2021   MG 2.5 (H) 01/15/2021   No results found for: "VD25OH"  Lab Results  Component Value Date   PREALBUMIN 8.5 (L) 01/16/2021      Latest Ref Rng & Units 09/22/2021   11:42 AM 03/24/2021   12:08 PM 03/04/2021    3:24 AM  CBC EXTENDED  WBC 4.0 - 10.5 K/uL 5.5  5.6  15.1   RBC 4.22 - 5.81 MIL/uL 5.56  4.55  2.92   Hemoglobin 13.0 - 17.0 g/dL 47.4  25.9  8.3   HCT 56.3 - 52.0 % 53.1  41.2  27.0   Platelets 150 - 400 K/uL 224  284  482      There is no height  or weight on file to calculate BMI.  Orders:  Orders Placed This Encounter  Procedures   XR Cervical Spine 2 or 3 views   Ambulatory referral to Physical Medicine Rehab   No orders of the defined types were placed in this encounter.    Procedures: No procedures performed  Clinical Data: No additional findings.  ROS:  All other systems negative, except as noted in the HPI. Review of Systems  Objective: Vital Signs: There were no vitals taken for this visit.  Specialty Comments:  No specialty comments available.  PMFS History: Patient Active Problem List   Diagnosis Date Noted   Hyperlipidemia 09/09/2021   Wound dehiscence 03/02/2021   Subacute osteomyelitis, right ankle and foot (HCC)    Sleep disturbance    Essential hypertension    Diabetic peripheral neuropathy (HCC)    Labile blood glucose    Left below-knee amputee ([place]) 02/07/2021   PAD (peripheral artery disease) (HCC) 02/01/2021    Below-knee amputation of left lower extremity ([place]) 01/27/2021   S/P BKA (below knee amputation), left (HCC)    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HCC)    SOB (shortness of breath)    Open wound    Bacteremia    Chronic ulcer of left ankle (HCC)    Severe sepsis without septic shock (HCC)    COPD exacerbation (HCC) [id number]   Chronic systolic CHF (congestive heart failure) ([place]) 01/15/2021   Acute on chronic respiratory failure with hypoxia and hypercapnia (HCC) 01/15/2021   Acute renal failure superimposed on stage 3 chronic kidney disease (HCC) 01/15/2021   Elevated troponin 01/15/2021   Hyponatremia 01/15/2021   CHF exacerbation ([place]) 10/17/2020   Acute on chronic systolic CHF (congestive heart failure) ([place]) 10/16/2020   Acute on chronic combined systolic and diastolic CHF (congestive heart failure) ([place]) 03/15/2018   S/P right and left heart catheterization 11/02/2017   Panic attacks 11/02/2017   Non-STEMI (non-ST elevated myocardial infarction) ([place])    COPD (chronic obstructive pulmonary disease) ([place])    Hypertension    CKD (chronic kidney disease)    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HCC)    Presence of implantable cardioverter-defibrillator (ICD)    GERD (gastroesophageal reflux disease)    Diabetes mellitus with diabetic neuropathy, with long-term current use of insulin ([place])    Past Medical History:  Diagnosis Date   AICD (automatic cardioverter/defibrillator) present 01/11/2017   Anxiety    CHF (congestive heart failure) (HCC)    CKD (chronic kidney disease)    COPD (chronic obstructive pulmonary disease) ([place])    never smoker, industrial exposure   Diabetes mellitus with diabetic neuropathy, with long-term current use of insulin (HCC)    GERD (gastroesophageal reflux disease)    High cholesterol    History of gout    "take RX qd" (11/01/2017)   Hypertension    Nonobstructive atherosclerosis of coronary artery    On home oxygen therapy    "2L prn" (11/01/2017)   Single  hamartoma of lung (HCC)    LLL, present for years   Systolic HF (heart failure) ([place])     Family History  Problem Relation Age of Onset   Hypertension Mother    Diabetes Mother    Hypertension Father    Diabetes Father    Diabetes Sister    Diabetes Brother     Past Surgical History:  Procedure Laterality Date   ABDOMINAL AORTOGRAM W/LOWER EXTREMITY N/A 02/01/2021   Procedure: ABDOMINAL AORTOGRAM W/LOWER EXTREMITY;  Surgeon: Harold Barban  W, MD;  Location: [facility] IN[facility]IVE [facility] LAB;  Service: Cardiovascular;  Laterality: N/A;   AMPUTATION Left 01/21/2021   Procedure: AMPUTATION BELOW KNEE;  Surgeon: Nadara Mustard, MD;  Location: [facility] OR;  Service: Orthopedics;  Laterality: Left;   AMPUTATION Right 02/04/2021   Procedure: RIGHT FOOT 1ST AND 2ND RAY AMPUTATION;  Surgeon: Nadara Mustard, MD;  Location: [facility] OR;  Service: Orthopedics;  Laterality: Right;   AMPUTATION Right 03/02/2021   Procedure: RIGHT BELOW KNEE AMPUTATION;  Surgeon: Nadara Mustard, MD;  Location: [facility] OR;  Service: Orthopedics;  Laterality: Right;   APPLICATION OF WOUND VAC Left 01/21/2021   Procedure: APPLICATION OF WOUND VAC;  Surgeon: Nadara Mustard, MD;  Location: [facility] OR;  Service: Orthopedics;  Laterality: Left;   APPLICATION OF WOUND VAC  02/04/2021   Procedure: APPLICATION OF WOUND VAC;  Surgeon: Nadara Mustard, MD;  Location: [facility] OR;  Service: Orthopedics;;   APPLICATION OF WOUND VAC  03/02/2021   Procedure: APPLICATION OF WOUND VAC;  Surgeon: Nadara Mustard, MD;  Location: [facility] OR;  Service: Orthopedics;;   CATARACT EXTRACTION W/ INTRAOCULAR LENS IMPLANTW/ TRABECULECTOMY Left ~ 2013   "may have done this when I had my other eye OR"   CIRCUMCISION     CORONARY STENT INTERVENTION N/A 11/02/2017   Procedure: CORONARY STENT INTERVENTION;  Surgeon: Laurey Morale, MD;  Location: [facility];  Service: Cardiovascular;  Laterality: N/A;   CORONARY STENT INTERVENTION N/A 11/02/2017   Procedure: CORONARY STENT INTERVENTION;  Surgeon:  Lyn Records, MD;  Location: [facility];  Service: Cardiovascular;  Laterality: N/A;   EYE SURGERY Left ~ 2013   "fell on something in basement; stuck in my eye"   ICD IMPLANT     Medtronic Dual Chamber 01/11/17   MULTIPLE TOOTH EXTRACTIONS     "pulled all my top teeth"   PERIPHERAL VASCULAR BALLOON ANGIOPLASTY  02/01/2021   Procedure: PERIPHERAL VASCULAR BALLOON ANGIOPLASTY;  Surgeon: Nada Libman, MD;  Location: [facility];  Service: Cardiovascular;;  [place] / PT   RIGHT/LEFT HEART CATH AND CORONARY ANGIOGRAPHY N/A 11/02/2017   Procedure: RIGHT/LEFT HEART CATH AND CORONARY ANGIOGRAPHY;  Surgeon: Laurey Morale, MD;  Location[facility] INVASIVE CV LAB;  Service: Cardiovascular;  Laterality: N/A;   TEE WITHOUT CARDIOVERSION N/A 01/20/2021   Procedure: TRANSESOPHAGEAL ECHOCARDIOGRAM (TEE);  Surgeon: Laurey Morale, MD;  Location: [facility] ENDOSCOPY;  Service: Cardiovascular;  Laterality: N/A;   Social History   Occupational History   Occupation: disability    Comment: stopped working in 2000 d/t occupational exposures  Tobacco Use   Smoking status: Former    Types: Cigarettes   Smokeless tobacco: Never   Tobacco comments:    "smoked when I drank"  Vaping Use   Vaping Use: Never used  Substance and Sexual Activity   Alcohol use: Not Currently    Comment: 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

## 2021-12-13 NOTE — Progress Notes (Signed)
Remote ICD transmission.   

## 2021-12-15 ENCOUNTER — Other Ambulatory Visit (HOSPITAL_COMMUNITY): Payer: Self-pay | Admitting: Emergency Medicine

## 2021-12-15 NOTE — Progress Notes (Unsigned)
Paramedicine Encounter    Patient ID: Richard [name], male    DOB: Jul 29, 1960, 61 y.o.   MRN: [id number]   BP 140/90 (BP Location: Left Arm, Patient Position: Sitting, Cuff Size: Normal)   Pulse 74   Resp 16   Wt 239 lb 12.8 oz (108.8 kg)   SpO2 96%   BMI 28.44 kg/m  Weight yesterday- - did not weigh  Last visit weight-233lb  Patient Care Team: Olevia Perches as PCP - General (Internal Medicine) Laurey Morale, MD as PCP - Advanced Heart Failure (Cardiology) Duke Salvia, MD as PCP - Electrophysiology (Cardiology) Burna Sis, LCSW as Social Worker (Licensed Clinical Social Worker)  Patient Active Problem List   Diagnosis Date Noted   Hyperlipidemia 09/09/2021   Wound dehiscence 03/02/2021   Subacute osteomyelitis, right ankle and foot (HCC)    Sleep disturbance    Essential hypertension    Diabetic peripheral neuropathy (HCC)    Labile blood glucose    Left below-knee amputee (HCC) 02/07/2021   PAD (peripheral artery disease) (HCC) 02/01/2021   Below-knee amputation of left lower extremity (HCC) 01/27/2021   S/P BKA (below knee amputation), left (HCC)    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HCC)    SOB (shortness of breath)    Open wound    Bacteremia    Chronic ulcer of left ankle (HCC)    Severe sepsis without septic shock (HCC)    COPD exacerbation (HCC) 01/15/2021   Chronic systolic CHF (congestive heart failure) (HCC) 01/15/2021   Acute on chronic respiratory failure with hypoxia and hypercapnia (HCC) 01/15/2021   Acute renal failure superimposed on stage 3 chronic kidney disease (HCC) 01/15/2021   Elevated troponin 01/15/2021   Hyponatremia 01/15/2021   CHF exacerbation (HCC) 10/17/2020   Acute on chronic systolic CHF (congestive heart failure) (HCC) 10/16/2020   Acute on chronic combined systolic and diastolic CHF (congestive heart failure) (HCC) 03/15/2018   S/P right and left heart catheterization 11/02/2017   Panic attacks 11/02/2017   Non-STEMI  (non-ST elevated myocardial infarction) (HCC)    COPD (chronic obstructive pulmonary disease) (HCC)    Hypertension    CKD (chronic kidney disease)    Single hamartoma of lung (HCC)    Presence of implantable cardioverter-defibrillator (ICD)    GERD (gastroesophageal reflux disease)    Diabetes mellitus with diabetic neuropathy, with long-term current use of insulin (HCC)     Current Outpatient Medications:    allopurinol (ZYLOPRIM) 100 MG tablet, Take 1 tablet (100 mg total) by mouth daily., Disp: 30 tablet, Rfl: 3   aspirin 81 MG chewable tablet, Chew 81 mg by mouth daily., Disp: , Rfl:    atorvastatin (LIPITOR) 40 MG tablet, TAKE ONE TABLET BY MOUTH DAILY, Disp: 90 tablet, Rfl: 3   bisoprolol (ZEBETA) 5 MG tablet, TAKE HALF TABLET BY MOUTH DAILY, Disp: 30 tablet, Rfl: 11   clopidogrel (PLAVIX) 75 MG tablet, TAKE ONE TABLET BY MOUTH DAILY, Disp: 30 tablet, Rfl: 11   digoxin (LANOXIN) 0.125 MG tablet, Take 1 tablet (0.125 mg total) by mouth daily., Disp: 60 tablet, Rfl: 3   docusate sodium (COLACE) 100 MG capsule, Take 1 capsule (100 mg total) by mouth daily., Disp: 10 capsule, Rfl: 0   DULoxetine (CYMBALTA) 60 MG capsule, Take 1 capsule (60 mg total) by mouth daily., Disp: 30 capsule, Rfl: 2   empagliflozin (JARDIANCE) 25 MG TABS tablet, Take 25 mg by mouth daily., Disp: , Rfl:    fenofibrate (TRICOR)  145 MG tablet, Take 145 mg by mouth daily., Disp: , Rfl:    gabapentin (NEURONTIN) 400 MG capsule, Take 400 mg by mouth 3 (three) times daily., Disp: , Rfl:    hydrALAZINE (APRESOLINE) 50 MG tablet, Take 1.5 tablets (75 mg total) by mouth 3 (three) times daily., Disp: 540 tablet, Rfl: 2   insulin glargine-yfgn (SEMGLEE, YFGN,) 100 UNIT/ML injection, Inject 0.08 mLs (8 Units total) into the skin 2 (two) times daily., Disp: 10 mL, Rfl: 11   insulin regular (NOVOLIN R) 100 units/mL injection, Inject into the skin as needed for high blood sugar. As per sliding scale, Disp: , Rfl:    insulin  regular human CONCENTRATED (HUMULIN R U-500 KWIKPEN) 500 UNIT/ML KwikPen, Inject into the skin., Disp: , Rfl:    isosorbide mononitrate (IMDUR) 30 MG 24 hr tablet, Take 1 tablet (30 mg total) by mouth daily., Disp: 30 tablet, Rfl: 11   Magnesium Oxide 200 MG TABS, Take 2 tablets (400 mg total) by mouth daily., Disp: 60 tablet, Rfl: 11   Multiple Vitamin (MULTIVITAMIN WITH MINERALS) TABS tablet, Take 1 tablet by mouth daily., Disp: , Rfl:    sacubitril-valsartan (ENTRESTO) 24-26 MG, Take 1 tablet by mouth 2 (two) times daily., Disp: 60 tablet, Rfl: 6   spironolactone (ALDACTONE) 25 MG tablet, TAKE HALF TABLET BY MOUTH DAILY, Disp: 30 tablet, Rfl: 0   torsemide (DEMADEX) 20 MG tablet, Take 1 tablet (20 mg total) by mouth daily., Disp: 30 tablet, Rfl: 6   zinc sulfate 220 (50 Zn) MG capsule, Take 1 capsule (220 mg total) by mouth daily., Disp: 30 capsule, Rfl: 0   albuterol (VENTOLIN HFA) 108 (90 Base) MCG/ACT inhaler, Inhale 1-2 puffs into the lungs every 4 (four) hours as needed for wheezing or shortness of breath. (Patient not taking: Reported on 11/10/2021), Disp: 8 g, Rfl: 1   Fluticasone Furoate-Vilanterol (BREO ELLIPTA IN), Inhale into the lungs daily as needed. (Patient not taking: Reported on 11/10/2021), Disp: , Rfl:  Allergies  Allergen Reactions   Aspirin Itching   Codeine Hives   Coconut (Cocos Nucifera) Itching      Social History   Socioeconomic History   Marital status: Divorced    Spouse name: Not on file   Number of children: Not on file   Years of education: Not on file   Highest education level: Not on file  Occupational History   Occupation: disability    Comment: stopped working in 2000 d/t occupational exposures  Tobacco Use   Smoking status: Former    Types: Cigarettes   Smokeless tobacco: Never   Tobacco comments:    "smoked when I drank"  Vaping Use   Vaping Use: Never used  Substance and Sexual Activity   Alcohol use: Not Currently    Comment: used to  drink multiple cases of beer daily, quit 2018   Drug use: Never   Sexual activity: Not Currently  Other Topics Concern   Not on file  Social History Narrative   Patient lives at home with wife and some of his 10 children. He self-administers his own medications.   Social Determinants of Health   Financial Resource Strain: Low Risk  (09/22/2021)   Overall Financial Resource Strain (CARDIA)    Difficulty of Paying Living Expenses: Not hard at all  Food Insecurity: No Food Insecurity (09/22/2021)   Hunger Vital Sign    Worried About Running Out of Food in the Last Year: Never true    Ran Out of  Food in the Last Year: Never true  Transportation Needs: Unmet Transportation Needs (09/22/2021)   PRAPARE - Hydrologist (Medical): Yes    Lack of Transportation (Non-Medical): Yes  Physical Activity: Not on file  Stress: Not on file  Social Connections: Not on file  Intimate Partner Violence: Not on file    Physical Exam      Future Appointments  Date Time Provider [place]  12/19/2021 10:00 AM [facility] PA/NP [facility] None  01/10/2022 10:00 AM Magnus Sinning, MD [facility] None  02/16/2022 12:00 PM Larey Dresser, MD [facility] None  02/28/2022  7:20 AM [facility] DEVICE REMOTES [facility]  05/30/2022  7:20 AM [facility] DEVICE REMOTES [facility]  08/29/2022  7:20 AM [facility] DEVICE REMOTES [facility]  11/28/2022  7:20 AM [facility] DEVICE REMOTES [facility]  02/27/2023  7:20 AM [facility] DEVICE REMOTES [facility]       [name], [facility] Paramedic  12/15/21

## 2021-12-16 ENCOUNTER — Telehealth (HOSPITAL_COMMUNITY): Payer: Self-pay

## 2021-12-16 MED ORDER — TORSEMIDE 20 MG PO TABS: 40.0000 mg | ORAL_TABLET | Freq: Every day | ORAL | 6 refills | Status: DC

## 2021-12-16 NOTE — Telephone Encounter (Signed)
Called and left patient a voicemail to confirm/remind patient of their appointment at [facility] on 12/19/21.   Patient reminded to bring all medications and/or complete list.  Confirmed patient has transportation. Gave directions, instructed to utilize valet parking.  Confirmed appointment prior to ending call.

## 2021-12-19 ENCOUNTER — Encounter (HOSPITAL_COMMUNITY): Payer: Self-pay

## 2021-12-19 ENCOUNTER — Ambulatory Visit (HOSPITAL_COMMUNITY)
Admission: RE | Admit: 2021-12-19 | Discharge: 2021-12-19 | Disposition: A | Discharge status: Home / Self Care | Payer: Medicare Other | Source: Ambulatory Visit | Attending: Family Medicine | Admitting: Family Medicine

## 2021-12-19 VITALS — BP 120/90 | HR 83 | Wt 236.6 lb

## 2021-12-19 DIAGNOSIS — I428 Other cardiomyopathies: Secondary | ICD-10-CM | POA: Diagnosis present

## 2021-12-19 DIAGNOSIS — I130 Hypertensive heart and chronic kidney disease with heart failure and stage 1 through stage 4 chronic kidney disease, or unspecified chronic kidney disease: Secondary | ICD-10-CM | POA: Insufficient documentation

## 2021-12-19 DIAGNOSIS — Z79899 Other long term (current) drug therapy: Secondary | ICD-10-CM | POA: Insufficient documentation

## 2021-12-19 DIAGNOSIS — G4733 Obstructive sleep apnea (adult) (pediatric): Secondary | ICD-10-CM | POA: Diagnosis not present

## 2021-12-19 DIAGNOSIS — I2510 Atherosclerotic heart disease of native coronary artery without angina pectoris: Secondary | ICD-10-CM | POA: Diagnosis not present

## 2021-12-19 DIAGNOSIS — E781 Pure hyperglyceridemia: Secondary | ICD-10-CM | POA: Diagnosis not present

## 2021-12-19 DIAGNOSIS — D751 Secondary polycythemia: Secondary | ICD-10-CM

## 2021-12-19 DIAGNOSIS — E785 Hyperlipidemia, unspecified: Secondary | ICD-10-CM | POA: Diagnosis not present

## 2021-12-19 DIAGNOSIS — J449 Chronic obstructive pulmonary disease, unspecified: Secondary | ICD-10-CM

## 2021-12-19 DIAGNOSIS — Z794 Long term (current) use of insulin: Secondary | ICD-10-CM

## 2021-12-19 DIAGNOSIS — I5022 Chronic systolic (congestive) heart failure: Secondary | ICD-10-CM

## 2021-12-19 DIAGNOSIS — Z955 Presence of coronary angioplasty implant and graft: Secondary | ICD-10-CM | POA: Insufficient documentation

## 2021-12-19 DIAGNOSIS — N529 Male erectile dysfunction, unspecified: Secondary | ICD-10-CM

## 2021-12-19 DIAGNOSIS — E1122 Type 2 diabetes mellitus with diabetic chronic kidney disease: Secondary | ICD-10-CM | POA: Diagnosis not present

## 2021-12-19 DIAGNOSIS — Z7982 Long term (current) use of aspirin: Secondary | ICD-10-CM | POA: Insufficient documentation

## 2021-12-19 DIAGNOSIS — Z95810 Presence of automatic (implantable) cardiac defibrillator: Secondary | ICD-10-CM | POA: Diagnosis not present

## 2021-12-19 DIAGNOSIS — I493 Ventricular premature depolarization: Secondary | ICD-10-CM

## 2021-12-19 DIAGNOSIS — E1159 Type 2 diabetes mellitus with other circulatory complications: Secondary | ICD-10-CM

## 2021-12-19 LAB — BASIC METABOLIC PANEL
Anion gap: 9 (ref 5–15)
BUN: 31 mg/dL — ABNORMAL HIGH (ref 6–20)
CO2: 29 mmol/L (ref 22–32)
Calcium: 9.2 mg/dL (ref 8.9–10.3)
Chloride: 101 mmol/L (ref 98–111)
Creatinine, Ser: 1.47 mg/dL — ABNORMAL HIGH (ref 0.61–1.24)
GFR, Estimated: 55 mL/min — ABNORMAL LOW (ref >60)
Glucose, Bld: 205 mg/dL — ABNORMAL HIGH (ref 70–99)
Potassium: 4.4 mmol/L (ref 3.5–5.1)
Sodium: 139 mmol/L (ref 135–145)

## 2021-12-19 MED ORDER — ISOSORBIDE MONONITRATE ER 30 MG PO TB24: 15.0000 mg | ORAL_TABLET | Freq: Every day | ORAL | 11 refills | Status: DC

## 2021-12-19 MED ORDER — SILDENAFIL CITRATE 50 MG PO TABS: 50.0000 mg | ORAL_TABLET | Freq: Every day | ORAL | 0 refills | Status: DC | PRN

## 2021-12-19 NOTE — Progress Notes (Signed)
[facility] Clinic Note   ID:  Cage, Holleman [birth year], MRN [id number]   Provider location: [facility] Type of Visit: Established patient   PCP:  [name] Catalina, PA-C HF Cardiologist:  Dr. Shirlee Latch   HPI Richard [name] is a 61 y.o. male who has a history of chronic systolic HF s/p Medtronic ICD (12/2016), HTN, DM, and HLD.   Admitted 5/8-5/14/19 with A/C systolic HF. Echo showed EF 15-20%. Advanced HF team was consulted. He diuresed 20 lbs with IV lasix, then transitioned to torsemide 40 mg daily. Underwent R/LHC showing a focal severe RCA stenosis treated with DES.  He did not, however, appear to have severe enough coronary disease to explain his cardiomyopathy. Cardiac index was low at 1.9.  HF meds were optimized. Beta blocker was not started due to low CI and soft BPs. He was discharged on home oxygen. Referred to cardiac rehab. DC weight: 260 lbs.   Echo in 2/20 showed EF 20-25% with severe LV dilation, moderate RV enlargement with moderately decreased RV systolic function.  CPX in 2/21 showed a severe functional limitation but it actually appeared to be primarily due to pulmonary issues.   Echo in 1/22 showed EF < 20% with severe LV dilation, normal RV size with moderately decreased systolic function, dilated IVC.   He was admitted in 4/22 with CHF.   TEE in 7/22 showed EF 20% with severe LV dilation, moderate RV enlargement with moderately decreased RV systolic function.   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  Follow up 4/23, stable NYHA II, euvolemic on exam and by OptiVol.  Today he returns for HF follow up. Overall feeling fair, nauseated today after riding the bus. He is not SOB walking on flat ground. He wears 2L oxygen PRN. Denies palpitations, CP, dizziness, edema, or PND/Orthopnea. Appetite ok. No fever or chills. Weight at home 230 pounds. Taking all medications. Says his marriage is in  trouble due to not being able to have sex > 1 year with wife.  Medtronic device interrogation (personally reviewed): OptiVol down, thoracic impedence above reference line, 1 hour daily activity, no recent AT/AF, <1% v-pacing.  Labs (10/19): K 3.9, creatinine 1.18  Labs (10/20): BNP 160, K 3.8, creatinine 1.31 Labs (4/21): TGs 475, LDL 67, K 4.2, creatinine 1.8, urine and serum immunofixations negative.  Labs (11/21): LDL 42, HDL 27, K 4.3, creatinine 7.82 Labs (5/22): K 4.5, creatinine 1.46 Labs (11/19/20): SCr 2.0, 4.5  Labs (11/22): K 4.6, creatinine 1.15 Labs (3/22): K 4.3, creatinine 1.46. LDL 42, TG 68  Labs (5/23): K 4.5, creatinine 1.5  PMH: 1. Chronic systolic CHF: Primarily nonischemic cardiomyopathy.  Has Medtronic ICD.   - Echo 5/19: EF 15-20%, grade 2 DD, mild MR, RV severely reduced, LA moderately dilated, RA mildly dilated, mild TR.  - RHC/LHC (5/19): 80% distal LAD stenosis, 80% proximal RCA stenosis treated with DES to pRCA. Mean RA 13, PA 69/31 mean 47, mean 24, CI 1.91, PVR 4.8 WU.  - Gynecomastia with spironolactone. - Echo (2/20): EF 20-25%, severe LV enlargement, moderate RV enlargement, moderately decreased RV systolic function.  - CPX (2/21): peak VO2 13.2, VE/VCO2 slope 24, RER 1.10. Severe functional limitation due to OHS with severe restrictive lung disease without significant HF limitation.  - PYP scan (7/21): Negative for TTR cardiac amyloidosis.  - Echo (1/22): EF < 20% with severe LV dilation, normal RV size with moderately decreased  systolic function, dilated IVC.  - TEE (7/22): EF 20% with severe LV dilation, moderate RV enlargement with moderately decreased RV systolic function.  2. COPD: Uses 2L home oxygen. Never smoked, but apparently had significant occupational exposure.  It appears that he won a lawsuit dealing with the occupational exposure-related COPD.  3. CAD: Cath in 5/17 with 60% RCA stenosis.  - LHC (5/19): 80% distal LAD, 80% proximal RCA.  DES  to proximal RCA.  4. Polycythemia: Probably due to chronic hypoxemia.  5. Type II diabetes.  6. HTN 7. GERD 8. CKD: Stage 3.  Likely due to diabetes and HTN.  9. OSA: Unable to tolerate CPAP.  10. PAD: Right and left BKA 2022.  11. 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 Group B Strep bacteremia  Review of systems complete and found to be negative unless listed in HPI.    Current Outpatient Medications  Medication Sig Dispense Refill   albuterol (VENTOLIN HFA) 108 (90 Base) MCG/ACT inhaler Inhale 1-2 puffs into the lungs every 4 (four) hours as needed for wheezing or shortness of breath. 8 g 1   allopurinol (ZYLOPRIM) 100 MG tablet Take 1 tablet (100 mg total) by mouth daily. 30 tablet 3   aspirin 81 MG chewable tablet Chew 81 mg by mouth daily.     atorvastatin (LIPITOR) 40 MG tablet TAKE ONE TABLET BY MOUTH DAILY 90 tablet 3   bisoprolol (ZEBETA) 5 MG tablet TAKE HALF TABLET BY MOUTH DAILY 30 tablet 11   clopidogrel (PLAVIX) 75 MG tablet TAKE ONE TABLET BY MOUTH DAILY 30 tablet 11   digoxin (LANOXIN) 0.125 MG tablet Take 1 tablet (0.125 mg total) by mouth daily. 60 tablet 3   docusate sodium (COLACE) 100 MG capsule Take 1 capsule (100 mg total) by mouth daily. 10 capsule 0   DULoxetine (CYMBALTA) 60 MG capsule Take 1 capsule (60 mg total) by mouth daily. 30 capsule 2   empagliflozin (JARDIANCE) 25 MG TABS tablet Take 25 mg by mouth daily.     fenofibrate (TRICOR) 145 MG tablet Take 145 mg by mouth daily.     Fluticasone Furoate-Vilanterol (BREO ELLIPTA IN) Inhale into the lungs daily as needed.     gabapentin (NEURONTIN) 400 MG capsule Take 400 mg by mouth 3 (three) times daily.     hydrALAZINE (APRESOLINE) 50 MG tablet Take 1.5 tablets (75 mg total) by mouth 3 (three) times daily. 540 tablet 2   insulin regular human CONCENTRATED (HUMULIN R U-500 KWIKPEN) 500 UNIT/ML KwikPen Inject into the skin.     isosorbide mononitrate (IMDUR) 30 MG 24 hr tablet Take 1 tablet (30 mg total)  by mouth daily. 30 tablet 11   Magnesium Oxide 200 MG TABS Take 2 tablets (400 mg total) by mouth daily. 60 tablet 11   Multiple Vitamin (MULTIVITAMIN WITH MINERALS) TABS tablet Take 1 tablet by mouth daily.     sacubitril-valsartan (ENTRESTO) 24-26 MG Take 1 tablet by mouth 2 (two) times daily. 60 tablet 6   spironolactone (ALDACTONE) 25 MG tablet TAKE HALF TABLET BY MOUTH DAILY 30 tablet 0   torsemide (DEMADEX) 20 MG tablet Take 2 tablets (40 mg total) by mouth daily. 30 tablet 6   zinc sulfate 220 (50 Zn) MG capsule Take 1 capsule (220 mg total) by mouth daily. 30 capsule 0   insulin glargine-yfgn (SEMGLEE, YFGN,) 100 UNIT/ML injection Inject 0.08 mLs (8 Units total) into the skin 2 (two) times daily. (Patient not taking: Reported on 12/19/2021) 10  mL 11   insulin regular (NOVOLIN R) 100 units/mL injection Inject into the skin as needed for high blood sugar. As per sliding scale (Patient not taking: Reported on 12/19/2021)     No current facility-administered medications for this encounter.   Allergies  Allergen Reactions   Aspirin Itching   Codeine Hives   Coconut (Cocos Nucifera) Itching   Social History   Socioeconomic History   Marital status: Divorced    Spouse name: Not on file   Number of children: Not on file   Years of education: Not on file   Highest education level: Not on file  Occupational History   Occupation: disability    Comment: stopped working in 2000 d/t occupational exposures  Tobacco Use   Smoking status: Former    Types: Cigarettes   Smokeless tobacco: Never   Tobacco comments:    "smoked when I drank"  Vaping Use   Vaping Use: Never used  Substance and Sexual Activity   Alcohol use: Not Currently    Comment: 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  Other Topics Concern   Not on file  Social History Narrative   Patient lives at home with wife and some of his 10 children. He  self-administers his own medications.   Social Determinants of Health   Financial Resource Strain: Low Risk  (09/22/2021)   Overall Financial Resource Strain (CARDIA)    Difficulty of Paying Living Expenses: Not hard at all  Food Insecurity: No Food Insecurity (09/22/2021)   Hunger Vital Sign    Worried About Running Out of Food in the Last Year: Never true    Ran Out of Food in the Last Year: Never true  Transportation Needs: Unmet Transportation Needs (09/22/2021)   PRAPARE - Administrator, Civil Service (Medical): Yes    Lack of Transportation (Non-Medical): Yes  Physical Activity: Not on file  Stress: Not on file  Social Connections: Not on file  Intimate Partner Violence: Not on file   Family History  Problem Relation Age of Onset   Hypertension Mother    Diabetes Mother    Hypertension Father    Diabetes Father    Diabetes Sister    Diabetes Brother    BP 120/90   Pulse 83   Wt 107.3 kg (236 lb 9.6 oz)   SpO2 93%   BMI 28.06 kg/m   Wt Readings from Last 3 Encounters:  12/19/21 107.3 kg (236 lb 9.6 oz)  12/15/21 108.8 kg (239 lb 12.8 oz)  12/08/21 105.7 kg (233 lb 1.6 oz)   PHYSICAL EXAM: General:  NAD. No resp difficulty, arrived in [facility] on 2L oxygen. HEENT: Normal Neck: Supple. No JVD. Carotids 2+ bilat; no bruits. No lymphadenopathy or thryomegaly appreciated. Cor: PMI nondisplaced. Regular rate & rhythm. No rubs, gallops or murmurs. Lungs: Clear Abdomen: Soft, nontender, nondistended. No hepatosplenomegaly. No bruits or masses. Good bowel sounds. Extremities: No cyanosis, clubbing, rash, edema; s/p bilateral BKA with prosthesis. Neuro: Alert & oriented x 3, cranial nerves grossly intact. Moves all 4 extremities w/o difficulty. Affect pleasant.  ASSESSMENT & PLAN: 1. Chronic systolic CHF:  Primarily nonischemic cardiomyopathy.  Echo in 2017 with EF 15-20%.  Medtronic ICD. Echo 10/2017 EF 15-20%, severely dilated RV with severely decreased systolic  function.  LHC/RHC 11/06/17 showed volume overload with 80% RCA stenosis.  Cardiac index low at 1.91. The degree of coronary disease  does not explain his cardiomyopathy.  Echo in 2/20 showed severe LV dilation, EF 20-25%, moderately decreased RV systolic function.  PYP scan in 7/21 not suggestive of transthyretin amyloidosis. Echo in 1/22 showed EF < 20%, moderately decreased RV systolic function.  TEE in 7/22 with EF 20% with severe LV dilation, moderate RV enlargement with moderately decreased RV systolic function. NYHA class II symptoms, he is not volume overloaded by exam or OptiVol.  - Decrease Imdur to 15 mg daily (has not had AM meds yet and BP at goal). - Continue torsemide 40 mg daily. BMET today. - Continue Entresto 24-26 mg bid. - Continue empaglifozin.  - Continue spironolactone 12.5 mg daily.   - Continue hydralazine 75 mg + Imdur 30 mg daily. - Continue digoxin 0.125 mg daily, dig level 0.8 10/18/21. - Continue bisoprolol 2.5 mg daily.  - Continue Paramedicine.  2. COPD: Never smoked, but apparently had significant occupational exposure.  It appears that he won a lawsuit dealing with the occupational exposure-related COPD. CPX in 2/21 showed severe functional limitation, but it appeared to be due to pulmonary abnormalities rather than HF. He is on 2L home oxygen.  - Followup with pulmonary.  3. CAD:  LHC in 5/19 with 80-90% proximal RCA stenosis treated with DES to RCA.  This did not cause his cardiomyopathy but is a large RCA. No chest pain.  - Continue ASA 81 daily.  - Continue atorvastatin, LDL 42 (3/23).  4. Polycythemia: Likely related to chronic hypoxia.  5. OSA: Cannot tolerate CPAP, wears oxygen at night.  6. DM: He is on empagliflozin.   7. Hypertriglyceridemia: Off Vascepa due to blood in stool that resolved.  - Continue fenofibrate 145 mg daily. TG well controlled on recent LP, 68  8. PVCs: 3 day Zio (4/23) 1.3% PVC burden. If burden increases, consider AAD for suppression  (would opt for mexiletine vs amiodarone due to COPD). 9. ED: He is on Imdur.  - Will give Rx for sildenafil. Instructed NOT to take Imdur if planning on using sildenafil. I will reach out to paramedicine to notify her of med changes and help patient identify Imdur to pull out of his pill pack.  Follow up in 2 months with Dr. Shirlee Latch, as scheduled.Leonides Schanz, Jacklynn Ganong, FNP  12/19/2021  [facility] [address] [facility] [place] [address] [phone] (office) [phone] (fax)

## 2021-12-22 ENCOUNTER — Other Ambulatory Visit (HOSPITAL_COMMUNITY): Payer: Self-pay | Admitting: Emergency Medicine

## 2021-12-22 NOTE — Progress Notes (Signed)
Paramedicine Encounter    Patient ID: Richard [name], male    DOB: 1961-06-24, 61 y.o.   MRN: [id number]   BP 130/80 (BP Location: Left Arm, Patient Position: Sitting, Cuff Size: Normal)   Pulse 74   Wt 236 lb (107 kg)   SpO2 94%   BMI 27.99 kg/m  Weight yesterday-236lb Last visit weight-239.lb Cbg 101  ATF Richard [name] A&O x 4, skin warm and dry for good color.  He reports no complaints of chest pain or SOB. He has missed the noon dose of his Hydralazine for the whole week.  I discussed with I'm the importance of being compliant with all his meds.  He seems to be dealing with a lot of domestic issues with his wife which has potentially gotten him off track.  Also, per [facility], I reviewed what his Imdur med looks like and discussed that he cannot take this  med when he takes Sidenafil.  He has not yet received his Sidenafil from the pharmacy and they should deliver it by tomorrow.  Richard [name] has no complaint of chest pain or SOB. No notable swelling.  Medications reconciled for 1 week and scheduled follow up visit scheduled 12/29/21 @ 12:30.  Patient Care Team: Olevia Perches as PCP - General (Internal Medicine) Laurey Morale, MD as PCP - Advanced Heart Failure (Cardiology) Duke Salvia, MD as PCP - Electrophysiology (Cardiology) Burna Sis, LCSW as Social Worker (Licensed Clinical Social Worker)  Patient Active Problem List   Diagnosis Date Noted   Hyperlipidemia 09/09/2021   Wound dehiscence 03/02/2021   Subacute osteomyelitis, right ankle and foot (HCC)    Sleep disturbance    Essential hypertension    Diabetic peripheral neuropathy (HCC)    Labile blood glucose    Left below-knee amputee (HCC) 02/07/2021   PAD (peripheral artery disease) (HCC) 02/01/2021   Below-knee amputation of left lower extremity (HCC) 01/27/2021   S/P BKA (below knee amputation), left (HCC)    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HCC)    SOB (shortness of breath)    Open wound     Bacteremia    Chronic ulcer of left ankle (HCC)    Severe sepsis without septic shock (HCC)    COPD exacerbation (HCC) 01/15/2021   Chronic systolic CHF (congestive heart failure) (HCC) 01/15/2021   Acute on chronic respiratory failure with hypoxia and hypercapnia (HCC) 01/15/2021   Acute renal failure superimposed on stage 3 chronic kidney disease (HCC) 01/15/2021   Elevated troponin 01/15/2021   Hyponatremia 01/15/2021   CHF exacerbation (HCC) 10/17/2020   Acute on chronic systolic CHF (congestive heart failure) (HCC) 10/16/2020   Acute on chronic combined systolic and diastolic CHF (congestive heart failure) (HCC) 03/15/2018   S/P right and left heart catheterization 11/02/2017   Panic attacks 11/02/2017   Non-STEMI (non-ST elevated myocardial infarction) (HCC)    COPD (chronic obstructive pulmonary disease) (HCC)    Hypertension    CKD (chronic kidney disease)    Single hamartoma of lung (HCC)    Presence of implantable cardioverter-defibrillator (ICD)    GERD (gastroesophageal reflux disease)    Diabetes mellitus with diabetic neuropathy, with long-term current use of insulin (HCC)     Current Outpatient Medications:    allopurinol (ZYLOPRIM) 100 MG tablet, Take 1 tablet (100 mg total) by mouth daily., Disp: 30 tablet, Rfl: 3   aspirin 81 MG chewable tablet, Chew 81 mg by mouth daily., Disp: , Rfl:    atorvastatin (  LIPITOR) 40 MG tablet, TAKE ONE TABLET BY MOUTH DAILY, Disp: 90 tablet, Rfl: 3   bisoprolol (ZEBETA) 5 MG tablet, TAKE HALF TABLET BY MOUTH DAILY, Disp: 30 tablet, Rfl: 11   clopidogrel (PLAVIX) 75 MG tablet, TAKE ONE TABLET BY MOUTH DAILY, Disp: 30 tablet, Rfl: 11   digoxin (LANOXIN) 0.125 MG tablet, Take 1 tablet (0.125 mg total) by mouth daily., Disp: 60 tablet, Rfl: 3   docusate sodium (COLACE) 100 MG capsule, Take 1 capsule (100 mg total) by mouth daily., Disp: 10 capsule, Rfl: 0   DULoxetine (CYMBALTA) 60 MG capsule, Take 1 capsule (60 mg total) by mouth daily.,  Disp: 30 capsule, Rfl: 2   empagliflozin (JARDIANCE) 25 MG TABS tablet, Take 25 mg by mouth daily., Disp: , Rfl:    fenofibrate (TRICOR) 145 MG tablet, Take 145 mg by mouth daily., Disp: , Rfl:    gabapentin (NEURONTIN) 400 MG capsule, Take 400 mg by mouth 3 (three) times daily., Disp: , Rfl:    hydrALAZINE (APRESOLINE) 50 MG tablet, Take 1.5 tablets (75 mg total) by mouth 3 (three) times daily., Disp: 540 tablet, Rfl: 2   insulin glargine-yfgn (SEMGLEE, YFGN,) 100 UNIT/ML injection, Inject 0.08 mLs (8 Units total) into the skin 2 (two) times daily., Disp: 10 mL, Rfl: 11   insulin regular human CONCENTRATED (HUMULIN R U-500 KWIKPEN) 500 UNIT/ML KwikPen, Inject into the skin., Disp: , Rfl:    isosorbide mononitrate (IMDUR) 30 MG 24 hr tablet, Take 0.5 tablets (15 mg total) by mouth daily. Do not take if planning to use Viagra, Disp: 30 tablet, Rfl: 11   Magnesium Oxide 200 MG TABS, Take 2 tablets (400 mg total) by mouth daily., Disp: 60 tablet, Rfl: 11   Multiple Vitamin (MULTIVITAMIN WITH MINERALS) TABS tablet, Take 1 tablet by mouth daily., Disp: , Rfl:    sacubitril-valsartan (ENTRESTO) 24-26 MG, Take 1 tablet by mouth 2 (two) times daily., Disp: 60 tablet, Rfl: 6   sildenafil (VIAGRA) 50 MG tablet, Take 1 tablet (50 mg total) by mouth daily as needed for erectile dysfunction., Disp: 10 tablet, Rfl: 0   spironolactone (ALDACTONE) 25 MG tablet, TAKE HALF TABLET BY MOUTH DAILY, Disp: 30 tablet, Rfl: 0   torsemide (DEMADEX) 20 MG tablet, Take 2 tablets (40 mg total) by mouth daily., Disp: 30 tablet, Rfl: 6   zinc sulfate 220 (50 Zn) MG capsule, Take 1 capsule (220 mg total) by mouth daily., Disp: 30 capsule, Rfl: 0   albuterol (VENTOLIN HFA) 108 (90 Base) MCG/ACT inhaler, Inhale 1-2 puffs into the lungs every 4 (four) hours as needed for wheezing or shortness of breath. (Patient not taking: Reported on 12/22/2021), Disp: 8 g, Rfl: 1   Fluticasone Furoate-Vilanterol (BREO ELLIPTA IN), Inhale into the  lungs daily as needed. (Patient not taking: Reported on 12/22/2021), Disp: , Rfl:    insulin regular (NOVOLIN R) 100 units/mL injection, Inject into the skin as needed for high blood sugar. As per sliding scale (Patient not taking: Reported on 12/22/2021), Disp: , Rfl:  Allergies  Allergen Reactions   Aspirin Itching   Codeine Hives   Coconut (Cocos Nucifera) Itching      Social History   Socioeconomic History   Marital status: Divorced    Spouse name: Not on file   Number of children: Not on file   Years of education: Not on file   Highest education level: Not on file  Occupational History   Occupation: disability    Comment: stopped working in  2000 d/t occupational exposures  Tobacco Use   Smoking status: Former    Types: Cigarettes   Smokeless tobacco: Never   Tobacco comments:    "smoked when I drank"  Vaping Use   Vaping Use: Never used  Substance and Sexual Activity   Alcohol use: Not Currently    Comment: 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  Other Topics Concern   Not on file  Social History Narrative   Patient lives at home with wife and some of his 10 children. He self-administers his own medications.   Social Determinants of Health   Financial Resource Strain: Low Risk  (09/22/2021)   Overall Financial Resource Strain (CARDIA)    Difficulty of Paying Living Expenses: Not hard at all  Food Insecurity: No Food Insecurity (09/22/2021)   Hunger Vital Sign    Worried About Running Out of Food in the Last Year: Never true    Ran Out of Food in the Last Year: Never true  Transportation Needs: Unmet Transportation Needs (09/22/2021)   PRAPARE - Hydrologist (Medical): Yes    Lack of Transportation (Non-Medical): Yes  Physical Activity: Not on file  Stress: Not on file  Social Connections: Not on file  Intimate Partner Violence: Not on file    Physical  Exam      Future Appointments  Date Time Provider [place]  01/10/2022 10:00 AM Magnus Sinning, MD [facility] None  02/16/2022 12:00 PM Larey Dresser, MD [facility] None  02/28/2022  7:20 AM [facility] DEVICE REMOTES [facility]  05/30/2022  7:20 AM [facility] DEVICE REMOTES [facility]  08/29/2022  7:20 AM [facility] DEVICE REMOTES [facility]  11/28/2022  7:20 AM [facility] DEVICE REMOTES [facility]  02/27/2023  7:20 AM [facility] DEVICE REMOTES [facility]       [name], [facility] Paramedic  12/22/21

## 2021-12-23 ENCOUNTER — Other Ambulatory Visit: Payer: Self-pay | Admitting: Internal Medicine

## 2021-12-29 ENCOUNTER — Other Ambulatory Visit (HOSPITAL_COMMUNITY): Payer: Self-pay | Admitting: Emergency Medicine

## 2021-12-29 NOTE — Progress Notes (Signed)
Paramedicine Encounter    Patient ID: Richard [name], male    DOB: 1960/08/31, 61 y.o.   MRN: [id number]   BP 126/84 (BP Location: Left Arm, Patient Position: Sitting, Cuff Size: Normal)   Pulse 76   Resp 16   Wt 235 lb 9.6 oz (106.9 kg)   SpO2 97%   BMI 27.94 kg/m  Weight yesterday-did not weigh Last visit weight-236lb  ATF Mr. House A&O x 4, skin warm and dry with good color.  Pt. Denies chest pain or SOB.  Lung sounds clear and equal bilat.  He admits to eating a lot for the 4th of July and fell due to too much alcohol.  He missed multiple doses  of his meds.  He said he was afraid to take his meds and drink.    Called in refills on Dulcolax and multi-vitamins to [facility]  Pill box reconcilled.  Home visit complete.  Patient Care Team: Olevia Perches as PCP - General (Internal Medicine) Laurey Morale, MD as PCP - Advanced Heart Failure (Cardiology) Duke Salvia, MD as PCP - Electrophysiology (Cardiology) Burna Sis, LCSW as Social Worker (Licensed Clinical Social Worker)  Patient Active Problem List   Diagnosis Date Noted   Hyperlipidemia 09/09/2021   Wound dehiscence 03/02/2021   Subacute osteomyelitis, right ankle and foot (HCC)    Sleep disturbance    Essential hypertension    Diabetic peripheral neuropathy (HCC)    Labile blood glucose    Left below-knee amputee (HCC) 02/07/2021   PAD (peripheral artery disease) (HCC) 02/01/2021   Below-knee amputation of left lower extremity (HCC) 01/27/2021   S/P BKA (below knee amputation), left (HCC)    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HCC)    SOB (shortness of breath)    Open wound    Bacteremia    Chronic ulcer of left ankle (HCC)    Severe sepsis without septic shock (HCC)    COPD exacerbation (HCC) 01/15/2021   Chronic systolic CHF (congestive heart failure) (HCC) 01/15/2021   Acute on chronic respiratory failure with hypoxia and hypercapnia (HCC) 01/15/2021   Acute renal failure superimposed on  stage 3 chronic kidney disease (HCC) 01/15/2021   Elevated troponin 01/15/2021   Hyponatremia 01/15/2021   CHF exacerbation (HCC) 10/17/2020   Acute on chronic systolic CHF (congestive heart failure) (HCC) 10/16/2020   Acute on chronic combined systolic and diastolic CHF (congestive heart failure) (HCC) 03/15/2018   S/P right and left heart catheterization 11/02/2017   Panic attacks 11/02/2017   Non-STEMI (non-ST elevated myocardial infarction) (HCC)    COPD (chronic obstructive pulmonary disease) (HCC)    Hypertension    CKD (chronic kidney disease)    Single hamartoma of lung (HCC)    Presence of implantable cardioverter-defibrillator (ICD)    GERD (gastroesophageal reflux disease)    Diabetes mellitus with diabetic neuropathy, with long-term current use of insulin (HCC)     Current Outpatient Medications:    allopurinol (ZYLOPRIM) 100 MG tablet, Take 1 tablet (100 mg total) by mouth daily., Disp: 30 tablet, Rfl: 3   aspirin 81 MG chewable tablet, Chew 81 mg by mouth daily., Disp: , Rfl:    atorvastatin (LIPITOR) 40 MG tablet, TAKE ONE TABLET BY MOUTH DAILY, Disp: 90 tablet, Rfl: 3   bisoprolol (ZEBETA) 5 MG tablet, TAKE HALF TABLET BY MOUTH DAILY, Disp: 30 tablet, Rfl: 11   clopidogrel (PLAVIX) 75 MG tablet, TAKE ONE TABLET BY MOUTH DAILY, Disp: 30 tablet, Rfl:  11   digoxin (LANOXIN) 0.125 MG tablet, Take 1 tablet (0.125 mg total) by mouth daily., Disp: 60 tablet, Rfl: 3   docusate sodium (COLACE) 100 MG capsule, Take 1 capsule (100 mg total) by mouth daily., Disp: 10 capsule, Rfl: 0   DULoxetine (CYMBALTA) 60 MG capsule, Take 1 capsule (60 mg total) by mouth daily., Disp: 30 capsule, Rfl: 2   empagliflozin (JARDIANCE) 25 MG TABS tablet, Take 25 mg by mouth daily., Disp: , Rfl:    fenofibrate (TRICOR) 145 MG tablet, Take 145 mg by mouth daily., Disp: , Rfl:    gabapentin (NEURONTIN) 400 MG capsule, Take 400 mg by mouth 3 (three) times daily., Disp: , Rfl:    hydrALAZINE (APRESOLINE)  50 MG tablet, Take 1.5 tablets (75 mg total) by mouth 3 (three) times daily., Disp: 540 tablet, Rfl: 2   insulin glargine-yfgn (SEMGLEE, YFGN,) 100 UNIT/ML injection, Inject 0.08 mLs (8 Units total) into the skin 2 (two) times daily., Disp: 10 mL, Rfl: 11   insulin regular human CONCENTRATED (HUMULIN R U-500 KWIKPEN) 500 UNIT/ML KwikPen, Inject into the skin., Disp: , Rfl:    isosorbide mononitrate (IMDUR) 30 MG 24 hr tablet, Take 0.5 tablets (15 mg total) by mouth daily. Do not take if planning to use Viagra, Disp: 30 tablet, Rfl: 11   Magnesium Oxide 200 MG TABS, Take 2 tablets (400 mg total) by mouth daily., Disp: 60 tablet, Rfl: 11   Multiple Vitamin (MULTIVITAMIN WITH MINERALS) TABS tablet, Take 1 tablet by mouth daily., Disp: , Rfl:    sacubitril-valsartan (ENTRESTO) 24-26 MG, Take 1 tablet by mouth 2 (two) times daily., Disp: 60 tablet, Rfl: 6   sildenafil (VIAGRA) 50 MG tablet, Take 1 tablet (50 mg total) by mouth daily as needed for erectile dysfunction., Disp: 10 tablet, Rfl: 0   spironolactone (ALDACTONE) 25 MG tablet, TAKE HALF TABLET BY MOUTH DAILY, Disp: 30 tablet, Rfl: 0   torsemide (DEMADEX) 20 MG tablet, Take 2 tablets (40 mg total) by mouth daily., Disp: 30 tablet, Rfl: 6   zinc sulfate 220 (50 Zn) MG capsule, Take 1 capsule (220 mg total) by mouth daily., Disp: 30 capsule, Rfl: 0   albuterol (VENTOLIN HFA) 108 (90 Base) MCG/ACT inhaler, Inhale 1-2 puffs into the lungs every 4 (four) hours as needed for wheezing or shortness of breath. (Patient not taking: Reported on 12/22/2021), Disp: 8 g, Rfl: 1   Fluticasone Furoate-Vilanterol (BREO ELLIPTA IN), Inhale into the lungs daily as needed. (Patient not taking: Reported on 12/22/2021), Disp: , Rfl:    insulin regular (NOVOLIN R) 100 units/mL injection, Inject into the skin as needed for high blood sugar. As per sliding scale (Patient not taking: Reported on 12/22/2021), Disp: , Rfl:  Allergies  Allergen Reactions   Aspirin Itching    Codeine Hives   Coconut (Cocos Nucifera) Itching      Social History   Socioeconomic History   Marital status: Divorced    Spouse name: Not on file   Number of children: Not on file   Years of education: Not on file   Highest education level: Not on file  Occupational History   Occupation: disability    Comment: stopped working in 2000 d/t occupational exposures  Tobacco Use   Smoking status: Former    Types: Cigarettes   Smokeless tobacco: Never   Tobacco comments:    "smoked when I drank"  Vaping Use   Vaping Use: Never used  Substance and Sexual Activity   Alcohol  use: Not Currently    Comment: 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  Other Topics Concern   Not on file  Social History Narrative   Patient lives at home with wife and some of his 10 children. He self-administers his own medications.   Social Determinants of Health   Financial Resource Strain: Low Risk  (09/22/2021)   Overall Financial Resource Strain (CARDIA)    Difficulty of Paying Living Expenses: Not hard at all  Food Insecurity: No Food Insecurity (09/22/2021)   Hunger Vital Sign    Worried About Running Out of Food in the Last Year: Never true    Ran Out of Food in the Last Year: Never true  Transportation Needs: Unmet Transportation Needs (09/22/2021)   PRAPARE - Hydrologist (Medical): Yes    Lack of Transportation (Non-Medical): Yes  Physical Activity: Not on file  Stress: Not on file  Social Connections: Not on file  Intimate Partner Violence: Not on file    Physical Exam      Future Appointments  Date Time Provider [place]  01/10/2022 10:00 AM Magnus Sinning, MD [facility] None  02/16/2022 12:00 PM Larey Dresser, MD [facility] None  02/28/2022  7:20 AM [facility] DEVICE REMOTES [facility]  05/30/2022  7:20 AM [facility] DEVICE REMOTES [facility]   08/29/2022  7:20 AM [facility] DEVICE REMOTES [facility]  11/28/2022  7:20 AM [facility] DEVICE REMOTES [facility]  02/27/2023  7:20 AM [facility] DEVICE REMOTES [facility]       [name], [place] [facility] Paramedic  12/29/21

## 2022-01-04 ENCOUNTER — Other Ambulatory Visit (HOSPITAL_COMMUNITY): Payer: Self-pay | Admitting: Emergency Medicine

## 2022-01-04 NOTE — Progress Notes (Signed)
Paramedicine Encounter    Patient ID: Richard [name], male    DOB: 1960/09/10, 61 y.o.   MRN: [id number]   There were no vitals taken for this visit. Weight yesterday-did not weigh Last visit weight-235lb CBG 244  Found Richard [name] A&O x 4, skin warm and dry with good color.  He has no complaints of chest pain or SOB.  Lung sounds clear and equal bilat.  He had poor medication compliance this week missing almost every noon dose of his Gabapentin and Hydralazine. He had not yet taken his morning meds on my arrival.  I emphasized the importance of med compliance.  He also is doing poorly with his nutrition and he admits to same. He was up 3 lbs from last visit.  He had no obvious swelling, no JVD.  Med box reconciled.  Did make a call to his Endocrinologist regarding his correct medications.  He is to be taking his Humulin R U-500 and Farxiga only.  Semglee and Novolin R are no longer active regimens for him. Home visit complete.  Patient Care Team: Olevia Perches as PCP - General (Internal Medicine) Laurey Morale, MD as PCP - Advanced Heart Failure (Cardiology) Duke Salvia, MD as PCP - Electrophysiology (Cardiology) Burna Sis, LCSW as Social Worker (Licensed Clinical Social Worker)  Patient Active Problem List   Diagnosis Date Noted   Hyperlipidemia 09/09/2021   Wound dehiscence 03/02/2021   Subacute osteomyelitis, right ankle and foot (HCC)    Sleep disturbance    Essential hypertension    Diabetic peripheral neuropathy (HCC)    Labile blood glucose    Left below-knee amputee (HCC) 02/07/2021   PAD (peripheral artery disease) (HCC) 02/01/2021   Below-knee amputation of left lower extremity (HCC) 01/27/2021   S/P BKA (below knee amputation), left (HCC)    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HCC)    SOB (shortness of breath)    Open wound    Bacteremia    Chronic ulcer of left ankle (HCC)    Severe sepsis without septic shock (HCC)    COPD exacerbation (HCC) 01/15/2021    Chronic systolic CHF (congestive heart failure) (HCC) 01/15/2021   Acute on chronic respiratory failure with hypoxia and hypercapnia (HCC) 01/15/2021   Acute renal failure superimposed on stage 3 chronic kidney disease (HCC) 01/15/2021   Elevated troponin 01/15/2021   Hyponatremia 01/15/2021   CHF exacerbation (HCC) 10/17/2020   Acute on chronic systolic CHF (congestive heart failure) (HCC) 10/16/2020   Acute on chronic combined systolic and diastolic CHF (congestive heart failure) (HCC) 03/15/2018   S/P right and left heart catheterization 11/02/2017   Panic attacks 11/02/2017   Non-STEMI (non-ST elevated myocardial infarction) (HCC)    COPD (chronic obstructive pulmonary disease) (HCC)    Hypertension    CKD (chronic kidney disease)    Single hamartoma of lung (HCC)    Presence of implantable cardioverter-defibrillator (ICD)    GERD (gastroesophageal reflux disease)    Diabetes mellitus with diabetic neuropathy, with long-term current use of insulin (HCC)     Current Outpatient Medications:    albuterol (VENTOLIN HFA) 108 (90 Base) MCG/ACT inhaler, Inhale 1-2 puffs into the lungs every 4 (four) hours as needed for wheezing or shortness of breath. (Patient not taking: Reported on 12/22/2021), Disp: 8 g, Rfl: 1   allopurinol (ZYLOPRIM) 100 MG tablet, Take 1 tablet (100 mg total) by mouth daily., Disp: 30 tablet, Rfl: 3   aspirin 81 MG chewable tablet, Chew  81 mg by mouth daily., Disp: , Rfl:    atorvastatin (LIPITOR) 40 MG tablet, TAKE ONE TABLET BY MOUTH DAILY, Disp: 90 tablet, Rfl: 3   bisoprolol (ZEBETA) 5 MG tablet, TAKE HALF TABLET BY MOUTH DAILY, Disp: 30 tablet, Rfl: 11   clopidogrel (PLAVIX) 75 MG tablet, TAKE ONE TABLET BY MOUTH DAILY, Disp: 30 tablet, Rfl: 11   digoxin (LANOXIN) 0.125 MG tablet, Take 1 tablet (0.125 mg total) by mouth daily., Disp: 60 tablet, Rfl: 3   docusate sodium (COLACE) 100 MG capsule, Take 1 capsule (100 mg total) by mouth daily., Disp: 10 capsule, Rfl:  0   DULoxetine (CYMBALTA) 60 MG capsule, Take 1 capsule (60 mg total) by mouth daily., Disp: 30 capsule, Rfl: 2   empagliflozin (JARDIANCE) 25 MG TABS tablet, Take 25 mg by mouth daily., Disp: , Rfl:    fenofibrate (TRICOR) 145 MG tablet, Take 145 mg by mouth daily., Disp: , Rfl:    Fluticasone Furoate-Vilanterol (BREO ELLIPTA IN), Inhale into the lungs daily as needed. (Patient not taking: Reported on 12/22/2021), Disp: , Rfl:    gabapentin (NEURONTIN) 400 MG capsule, Take 400 mg by mouth 3 (three) times daily., Disp: , Rfl:    hydrALAZINE (APRESOLINE) 50 MG tablet, Take 1.5 tablets (75 mg total) by mouth 3 (three) times daily., Disp: 540 tablet, Rfl: 2   insulin glargine-yfgn (SEMGLEE, YFGN,) 100 UNIT/ML injection, Inject 0.08 mLs (8 Units total) into the skin 2 (two) times daily., Disp: 10 mL, Rfl: 11   insulin regular (NOVOLIN R) 100 units/mL injection, Inject into the skin as needed for high blood sugar. As per sliding scale (Patient not taking: Reported on 12/22/2021), Disp: , Rfl:    insulin regular human CONCENTRATED (HUMULIN R U-500 KWIKPEN) 500 UNIT/ML KwikPen, Inject into the skin., Disp: , Rfl:    isosorbide mononitrate (IMDUR) 30 MG 24 hr tablet, Take 0.5 tablets (15 mg total) by mouth daily. Do not take if planning to use Viagra, Disp: 30 tablet, Rfl: 11   Magnesium Oxide 200 MG TABS, Take 2 tablets (400 mg total) by mouth daily., Disp: 60 tablet, Rfl: 11   Multiple Vitamin (MULTIVITAMIN WITH MINERALS) TABS tablet, Take 1 tablet by mouth daily., Disp: , Rfl:    sacubitril-valsartan (ENTRESTO) 24-26 MG, Take 1 tablet by mouth 2 (two) times daily., Disp: 60 tablet, Rfl: 6   sildenafil (VIAGRA) 50 MG tablet, Take 1 tablet (50 mg total) by mouth daily as needed for erectile dysfunction., Disp: 10 tablet, Rfl: 0   spironolactone (ALDACTONE) 25 MG tablet, TAKE HALF TABLET BY MOUTH DAILY, Disp: 30 tablet, Rfl: 0   torsemide (DEMADEX) 20 MG tablet, Take 2 tablets (40 mg total) by mouth daily.,  Disp: 30 tablet, Rfl: 6   zinc sulfate 220 (50 Zn) MG capsule, Take 1 capsule (220 mg total) by mouth daily., Disp: 30 capsule, Rfl: 0 Allergies  Allergen Reactions   Aspirin Itching   Codeine Hives   Coconut (Cocos Nucifera) Itching      Social History   Socioeconomic History   Marital status: Divorced    Spouse name: Not on file   Number of children: Not on file   Years of education: Not on file   Highest education level: Not on file  Occupational History   Occupation: disability    Comment: stopped working in 2000 d/t occupational exposures  Tobacco Use   Smoking status: Former    Types: Cigarettes   Smokeless tobacco: Never   Tobacco comments:    "  smoked when I drank"  Vaping Use   Vaping Use: Never used  Substance and Sexual Activity   Alcohol use: Not Currently    Comment: 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  Other Topics Concern   Not on file  Social History Narrative   Patient lives at home with wife and some of his 10 children. He self-administers his own medications.   Social Determinants of Health   Financial Resource Strain: Low Risk  (09/22/2021)   Overall Financial Resource Strain (CARDIA)    Difficulty of Paying Living Expenses: Not hard at all  Food Insecurity: No Food Insecurity (09/22/2021)   Hunger Vital Sign    Worried About Running Out of Food in the Last Year: Never true    Ran Out of Food in the Last Year: Never true  Transportation Needs: Unmet Transportation Needs (09/22/2021)   PRAPARE - Administrator, Civil Service (Medical): Yes    Lack of Transportation (Non-Medical): Yes  Physical Activity: Not on file  Stress: Not on file  Social Connections: Not on file  Intimate Partner Violence: Not on file    Physical Exam      Future Appointments  Date Time Provider Department Center  01/10/2022 10:00 AM Tyrell Antonio, MD [facility] None  02/16/2022 12:00  PM Laurey Morale, MD [facility] None  02/28/2022  7:20 AM [facility] DEVICE REMOTES [facility]  05/30/2022  7:20 AM [facility] DEVICE REMOTES [facility]  08/29/2022  7:20 AM [facility] DEVICE REMOTES [facility]  11/28/2022  7:20 AM [facility] DEVICE REMOTES [facility]  02/27/2023  7:20 AM [facility] DEVICE REMOTES [facility]       Beatrix Shipper, EMT-Paramedic [phone] [facility] Paramedic  01/04/22

## 2022-01-05 ENCOUNTER — Other Ambulatory Visit: Payer: Self-pay | Admitting: Cardiology

## 2022-01-10 ENCOUNTER — Other Ambulatory Visit (HOSPITAL_COMMUNITY): Payer: Self-pay | Admitting: Cardiology

## 2022-01-10 ENCOUNTER — Ambulatory Visit (INDEPENDENT_AMBULATORY_CARE_PROVIDER_SITE_OTHER): Payer: Medicare Other | Admitting: Physical Medicine and Rehabilitation

## 2022-01-10 ENCOUNTER — Encounter: Payer: Self-pay | Admitting: Physical Medicine and Rehabilitation

## 2022-01-10 DIAGNOSIS — R202 Paresthesia of skin: Secondary | ICD-10-CM | POA: Diagnosis not present

## 2022-01-10 NOTE — Progress Notes (Signed)
Richard [name] - 61 y.o. male MRN [id number]  Date of birth: 05/31/1961  Office Visit Note: Visit Date: 01/10/2022 PCP: Robert Bellow, PA-C Referred by: Newt Minion, MD  Subjective: Chief Complaint  Patient presents with   Left Arm - Weakness   Left Hand - Weakness   HPI:  Richard [name] is a 61 y.o. male who comes in today at the request of Dr. Meridee Score for electrodiagnostic study of the Left upper extremities.  Patient is Right hand dominant.  He complains of chronic worsening left arm and hand weakness more than pain although does get pain and numbness constantly.  He has difficulty picking up his arm and holding items.  He does use pain medications and topicals for some relief.  He feels like his hand feels cold at times.  He does feel weakness mainly with elbow flexion and abduction of the shoulder.  He has significant diabetes with diabetic complication.  Last hemoglobin A1c 2 weeks ago was 9.9.  He is status post bilateral transtibial amputation.  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  His right hand today does show significant atrophy of the intrinsic muscles as well as distal innervated muscles.   ROS Otherwise per HPI.  Assessment & Plan: Visit Diagnoses:    ICD-10-CM   1. Paresthesia of skin  R20.2 NCV with EMG (electromyography)      Plan: Impression: The above electrodiagnostic study is ABNORMAL and reveals evidence of:  **Severe subacute C6 radiculopathy on the left.   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  This is in conjunction with prior electrodiagnostic study of the right upper extremity and right lower limb report. Cannot rule out an underlying median nerve neuropathy at the wrist but hard to distinguish with the level of  neuropathy.  Recommendations: 1.  Follow-up with referring physician. 2.  Continue current management of symptoms. 3.  Suggest cervical MRI and surgical evaluation.  Meds & Orders: No orders of the defined types were placed in this encounter.   Orders Placed This Encounter  Procedures   NCV with EMG (electromyography)    Follow-up: Return in about 2 weeks (around 01/24/2022) for  Meridee Score, MD.   Procedures: No procedures performed  EMG & NCV Findings: Evaluation of the left median motor nerve showed prolonged distal onset latency (6.9 ms), reduced amplitude (3.4 mV), and decreased conduction velocity (Elbow-Wrist, 33 m/s).  The left ulnar motor nerve showed prolonged distal onset latency (6.6 ms), reduced amplitude (0.9 mV), decreased conduction velocity (B Elbow-Wrist, 35 m/s), and decreased conduction velocity (A Elbow-B Elbow, 32 m/s).  The left median (across palm) sensory nerve showed no response (Wrist) and no response (Palm).  The left ulnar sensory nerve showed prolonged distal peak latency (6.0 ms), reduced amplitude (1.9 V), and decreased conduction velocity (Wrist-5th Digit, 23 m/s).  All remaining nerves (as indicated in the following tables) were within normal limits.    Needle evaluation of the left first dorsal interosseous muscle showed increased insertional activity, widespread spontaneous activity, very increased polyphasic potentials, and diminished recruitment.  The left biceps and the left deltoid muscles showed increased insertional activity, increased spontaneous activity, and diminished recruitment.  All remaining muscles (as indicated in the following table) showed no evidence of electrical instability.    Impression: The above electrodiagnostic study is ABNORMAL and  reveals evidence of:  **Severe subacute C6 radiculopathy on the left.   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  This is in conjunction with  prior electrodiagnostic study of the right upper extremity and right lower limb report. Cannot rule out an underlying median nerve neuropathy at the wrist but hard to distinguish with the level of neuropathy.  Recommendations: 1.  Follow-up with referring physician. 2.  Continue current management of symptoms. 3.  Suggest cervical MRI and surgical evaluation.  ___________________________ Naaman Plummer FAAPMR Board Certified, American Board of Physical Medicine and Rehabilitation    Nerve Conduction Studies Anti Sensory Summary Table   Stim Site NR Peak (ms) Norm Peak (ms) P-T Amp (V) Norm P-T Amp Site1 Site2 Delta-P (ms) Dist (cm) Vel (m/s) Norm Vel (m/s)  Left Median Acr Palm Anti Sensory (2nd Digit)  31.2C  Wrist *NR  <3.6  >10 Wrist Palm  0.0    Palm *NR  <2.0          Left Radial Anti Sensory (Base 1st Digit)  30.6C  Wrist    2.4 <3.1 11.8  Wrist Base 1st Digit 2.4 0.0    Left Ulnar Anti Sensory (5th Digit)  31.2C  Wrist    *6.0 <3.7 *1.9 >15.0 Wrist 5th Digit 6.0 14.0 *23 >38   Motor Summary Table   Stim Site NR Onset (ms) Norm Onset (ms) O-P Amp (mV) Norm O-P Amp Site1 Site2 Delta-0 (ms) Dist (cm) Vel (m/s) Norm Vel (m/s)  Left Median Motor (Abd Poll Brev)  30.7C  Wrist    *6.9 <4.2 *3.4 >5 Elbow Wrist 8.2 27.0 *33 >50  Elbow    15.1  3.2         Left Ulnar Motor (Abd Dig Min)  31C  Wrist    *6.6 <4.2 *0.9 >3 B Elbow Wrist 7.2 25.5 *35 >53  B Elbow    13.8  0.2  A Elbow B Elbow 3.4 11.0 *32 >53  A Elbow    17.2  0.8          EMG   Side Muscle Nerve Root Ins Act Fibs Psw Amp Dur Poly Recrt Int Dennie Bible Comment  Left Abd Poll Brev Median C8-T1 Nml Nml Nml Nml Nml 0 Nml Nml   Left 1stDorInt Ulnar C8-T1 *Incr *4+ *4+ Nml Nml *3+ *Reduced Nml   Left PronatorTeres Median C6-7 Nml Nml Nml Nml Nml 0 Nml Nml   Left Biceps Musculocut C5-6 *Incr *3+ *3+ Nml Nml 0 *Reduced Nml   Left Deltoid Axillary C5-6 *Incr *3+ *3+ Nml Nml 0 *Reduced Nml     Nerve Conduction Studies Anti  Sensory Left/Right Comparison   Stim Site L Lat (ms) R Lat (ms) L-R Lat (ms) L Amp (V) R Amp (V) L-R Amp (%) Site1 Site2 L Vel (m/s) R Vel (m/s) L-R Vel (m/s)  Median Acr Palm Anti Sensory (2nd Digit)  31.2C  Wrist       Wrist Palm     Palm             Radial Anti Sensory (Base 1st Digit)  30.6C  Wrist 2.4   11.8   Wrist Base 1st Digit     Ulnar Anti Sensory (5th Digit)  31.2C  Wrist *6.0   *1.9   Wrist 5th Digit *23     Motor Left/Right Comparison   Stim Site L Lat (ms) R Lat (ms) L-R Lat (ms) L Amp (mV) R Amp (mV) L-R  Amp (%) Site1 Site2 L Vel (m/s) R Vel (m/s) L-R Vel (m/s)  Median Motor (Abd Poll Brev)  30.7C  Wrist *6.9   *3.4   Elbow Wrist *33    Elbow 15.1   3.2         Ulnar Motor (Abd Dig Min)  31C  Wrist *6.6   *0.9   B Elbow Wrist *35    B Elbow 13.8   0.2   A Elbow B Elbow *32    A Elbow 17.2   0.8            Waveforms:            Clinical History: 12/12/2019 EMGNCS right upper and right lower study  Testing Results:  NCS: Right sural sensory, ulnar sensory and median sensory motor responses are normal. Right peroneal motor response at EDB is absent but present at tibialis anterior with normal conduction velocity. Right ulnar motor response is markedly low, and has marked drop in conduction velocity across the elbow and has slight distal latency prolongation as well. EMG: Patient declined EMG study. Ultrasound: Sonographic evaluation of right median nerve demonstrated diffuse increased cross-sectional area but predominantly at the wrist with reduced echogenicity and mobility but normal vascularity. Similarly right ulnar nerve demonstrated diffuse increased cross-sectional area but more severe at the elbow. _____________________________________________________________________  Conclusion:  This study is abnormal but limited due to lack of EMG data. 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 _____________________________________________________________________ Clinical Note: Findings were reviewed with the patient who expressed understanding.   I reviewed the waveforms and ultrasound images and edited the resident's diagnostic interpretation. I agree with the findings without further amendments.   Examined by (electronic signature): Electronically signed by: Ozzie Hoyle, MD 12/12/2019 2:05 PM     Objective:  VS:  HT:    WT:   BMI:     BP:   HR: bpm  TEMP: ( )  RESP:  Physical Exam Musculoskeletal:        General: No tenderness.     Comments: Inspection reveals significant atrophy of the intrinsic and distally innervated muscles on the right hand but without much in the way of atrophy on the left hand.  He appears to have some atrophy of the left deltoid compared to right.. There is no swelling, color changes, allodynia or dystrophic changes.  There appears to be 4 out of 5 strength in the left shoulder abduction and biceps flexion compared to right. There is a negative Hoffmann's test bilaterally.  Skin:    General: Skin is warm and dry.     Findings: No erythema or rash.  Neurological:     General: No focal deficit present.     Mental Status: He is alert and oriented to person, place, and time.     Sensory: No sensory deficit.     Motor: No weakness or abnormal muscle tone.     Coordination: Coordination normal.     Gait: Gait normal.  Psychiatric:        Mood and Affect: Mood normal.        Behavior: Behavior normal.        Thought Content: Thought content normal.      Imaging: No results found.

## 2022-01-10 NOTE — Progress Notes (Signed)
Pt state left arm and hand weakness. Pt state it hard for him to pick up and hold items. Pt state he takes pain meds and use pain cream to help. Pt state hand and arm feels cool a lot. Pt state it hard for him to make a fist. Pt state he right handed.

## 2022-01-11 ENCOUNTER — Other Ambulatory Visit (HOSPITAL_COMMUNITY): Payer: Self-pay | Admitting: Emergency Medicine

## 2022-01-11 NOTE — Progress Notes (Signed)
Paramedicine Encounter    Patient ID: Richard [name], male    DOB: 09-Apr-1961, 61 y.o.   MRN: [id number]   There were no vitals taken for this visit. Weight yesterday-not taken Last visit weight-238lb   ATF Mr. Sloane CA&O x 4, skin warm and dry with good color.  Pt denies chest pain or SOB.  Lung sounds clear and equal bilat.  No edema or JVD noted.  He was only about 50% compliant with his meds.  When asked why he has not been compliant he states he's been having person issues with his wife.  He is down 4lbs from last visit.  He states he's just not been eating.  He has not taken his morning meds yet because he wants to eat something else for breakfast.  Meds reconciled.  Refill for Fenofibrate called into [facility].  Home visit complete. Patient Care Team: Olevia Perches as PCP - General (Internal Medicine) Laurey Morale, MD as PCP - Advanced Heart Failure (Cardiology) Duke Salvia, MD as PCP - Electrophysiology (Cardiology) Burna Sis, LCSW as Social Worker (Licensed Clinical Social Worker)  Patient Active Problem List   Diagnosis Date Noted  . Hyperlipidemia 09/09/2021  . Wound dehiscence 03/02/2021  . Subacute osteomyelitis, right ankle and foot (HCC)   . Sleep disturbance   . Essential hypertension   . Diabetic peripheral neuropathy (HCC)   . Labile blood glucose   . Left below-knee amputee (HCC) 02/07/2021  . PAD (peripheral artery disease) (HCC) 02/01/2021  . Below-knee amputation of left lower extremity (HCC) 01/27/2021  . S/P BKA (below knee amputation), left (HCC)   . 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HCC)   . SOB (shortness of breath)   . Open wound   . Bacteremia   . Chronic ulcer of left ankle (HCC)   . Severe sepsis without septic shock (HCC)   . COPD exacerbation (HCC) 01/15/2021  . Chronic systolic CHF (congestive heart failure) (HCC) 01/15/2021  . Acute on chronic respiratory failure with hypoxia and hypercapnia (HCC) 01/15/2021  . Acute  renal failure superimposed on stage 3 chronic kidney disease (HCC) 01/15/2021  . Elevated troponin 01/15/2021  . Hyponatremia 01/15/2021  . CHF exacerbation (HCC) 10/17/2020  . Acute on chronic systolic CHF (congestive heart failure) (HCC) 10/16/2020  . Acute on chronic combined systolic and diastolic CHF (congestive heart failure) (HCC) 03/15/2018  . S/P right and left heart catheterization 11/02/2017  . Panic attacks 11/02/2017  . Non-STEMI (non-ST elevated myocardial infarction) (HCC)   . COPD (chronic obstructive pulmonary disease) (HCC)   . Hypertension   . CKD (chronic kidney disease)   . Single hamartoma of lung (HCC)   . Presence of implantable cardioverter-defibrillator (ICD)   . GERD (gastroesophageal reflux disease)   . Diabetes mellitus with diabetic neuropathy, with long-term current use of insulin (HCC)     Current Outpatient Medications:  .  allopurinol (ZYLOPRIM) 100 MG tablet, Take 1 tablet (100 mg total) by mouth daily., Disp: 30 tablet, Rfl: 3 .  aspirin 81 MG chewable tablet, Chew 81 mg by mouth daily., Disp: , Rfl:  .  atorvastatin (LIPITOR) 40 MG tablet, TAKE ONE TABLET BY MOUTH DAILY, Disp: 90 tablet, Rfl: 3 .  bisoprolol (ZEBETA) 5 MG tablet, TAKE HALF TABLET BY MOUTH DAILY, Disp: 30 tablet, Rfl: 11 .  clopidogrel (PLAVIX) 75 MG tablet, TAKE ONE TABLET BY MOUTH DAILY, Disp: 30 tablet, Rfl: 11 .  digoxin (LANOXIN) 0.125 MG tablet, Take  1 tablet (0.125 mg total) by mouth daily., Disp: 60 tablet, Rfl: 3 .  docusate sodium (COLACE) 100 MG capsule, Take 1 capsule (100 mg total) by mouth daily., Disp: 10 capsule, Rfl: 0 .  DULoxetine (CYMBALTA) 60 MG capsule, Take 1 capsule (60 mg total) by mouth daily., Disp: 30 capsule, Rfl: 2 .  empagliflozin (JARDIANCE) 25 MG TABS tablet, Take 25 mg by mouth daily., Disp: , Rfl:  .  fenofibrate (TRICOR) 145 MG tablet, Take 145 mg by mouth daily., Disp: , Rfl:  .  gabapentin (NEURONTIN) 400 MG capsule, Take 400 mg by mouth 3 (three)  times daily., Disp: , Rfl:  .  hydrALAZINE (APRESOLINE) 50 MG tablet, Take 1.5 tablets (75 mg total) by mouth 3 (three) times daily., Disp: 540 tablet, Rfl: 2 .  insulin regular human CONCENTRATED (HUMULIN R U-500 KWIKPEN) 500 UNIT/ML KwikPen, Inject into the skin., Disp: , Rfl:  .  isosorbide mononitrate (IMDUR) 30 MG 24 hr tablet, Take 0.5 tablets (15 mg total) by mouth daily. Do not take if planning to use Viagra, Disp: 30 tablet, Rfl: 11 .  Magnesium Oxide 200 MG TABS, Take 2 tablets (400 mg total) by mouth daily., Disp: 60 tablet, Rfl: 11 .  Multiple Vitamin (MULTIVITAMIN WITH MINERALS) TABS tablet, Take 1 tablet by mouth daily., Disp: , Rfl:  .  sacubitril-valsartan (ENTRESTO) 24-26 MG, Take 1 tablet by mouth 2 (two) times daily., Disp: 60 tablet, Rfl: 6 .  sildenafil (VIAGRA) 50 MG tablet, Take 1 tablet (50 mg total) by mouth daily as needed for erectile dysfunction., Disp: 10 tablet, Rfl: 0 .  spironolactone (ALDACTONE) 25 MG tablet, TAKE HALF TABLET BY MOUTH DAILY, Disp: 30 tablet, Rfl: 0 .  torsemide (DEMADEX) 20 MG tablet, Take 2 tablets (40 mg total) by mouth daily., Disp: 30 tablet, Rfl: 6 .  zinc sulfate 220 (50 Zn) MG capsule, Take 1 capsule (220 mg total) by mouth daily., Disp: 30 capsule, Rfl: 0 .  albuterol (VENTOLIN HFA) 108 (90 Base) MCG/ACT inhaler, Inhale 1-2 puffs into the lungs every 4 (four) hours as needed for wheezing or shortness of breath. (Patient not taking: Reported on 01/11/2022), Disp: 8 g, Rfl: 1 .  Fluticasone Furoate-Vilanterol (BREO ELLIPTA IN), Inhale into the lungs daily as needed. (Patient not taking: Reported on 01/11/2022), Disp: , Rfl:  .  insulin glargine-yfgn (SEMGLEE, YFGN,) 100 UNIT/ML injection, Inject 0.08 mLs (8 Units total) into the skin 2 (two) times daily. (Patient not taking: Reported on 01/11/2022), Disp: 10 mL, Rfl: 11 .  insulin regular (NOVOLIN R) 100 units/mL injection, Inject into the skin as needed for high blood sugar. As per sliding scale  (Patient not taking: Reported on 01/11/2022), Disp: , Rfl:  Allergies  Allergen Reactions  . Aspirin Itching  . Codeine Hives  . Coconut (Cocos Nucifera) Itching      Social History   Socioeconomic History  . Marital status: Divorced    Spouse name: Not on file  . Number of children: Not on file  . Years of education: Not on file  . Highest education level: Not on file  Occupational History  . Occupation: disability    Comment: stopped working in 2000 d/t occupational exposures  Tobacco Use  . Smoking status: Former    Types: Cigarettes  . Smokeless tobacco: Never  . Tobacco comments:    "smoked when I drank"  Vaping Use  . Vaping Use: Never used  Substance and Sexual Activity  . 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 Drug use: Never  . Sexual activity: Not Currently  Other Topics Concern  . Not on file  Social History Narrative   Patient lives at home with wife and some of his 10 children. He self-administers his own medications.   Social Determinants of Health   Financial Resource Strain: Low Risk  (09/22/2021)   Overall Financial Resource Strain (CARDIA)   . Difficulty of Paying Living Expenses: Not hard at all  Food Insecurity: No Food Insecurity (09/22/2021)   Hunger Vital Sign   . Worried About Programme researcher, broadcasting/film/video in the Last Year: Never true   . Ran Out of Food in the Last Year: Never true  Transportation Needs: Unmet Transportation Needs (09/22/2021)   PRAPARE - Transportation   . Lack of Transportation (Medical): Yes   . Lack of Transportation (Non-Medical): Yes  Physical Activity: Not on file  Stress: Not on file  Social Connections: Not on file  Intimate Partner Violence: Not on file    Physical Exam      Future Appointments  Date Time Provider Department Center  01/18/2022  9:15 AM Adonis Huguenin, NP [facility] None  02/16/2022 12:00 PM Laurey Morale, MD [facility] None  02/28/2022  7:20 AM  [facility] DEVICE REMOTES [facility]  05/30/2022  7:20 AM [facility] DEVICE REMOTES [facility]  08/29/2022  7:20 AM [facility] DEVICE REMOTES [facility]  11/28/2022  7:20 AM [facility] DEVICE REMOTES [facility]  02/27/2023  7:20 AM [facility] DEVICE REMOTES [facility]       Beatrix Shipper, EMT-Paramedic [phone] [facility]  01/11/22

## 2022-01-13 NOTE — Procedures (Signed)
EMG & NCV Findings: Evaluation of the left median motor nerve showed prolonged distal onset latency (6.9 ms), reduced amplitude (3.4 mV), and decreased conduction velocity (Elbow-Wrist, 33 m/s).  The left ulnar motor nerve showed prolonged distal onset latency (6.6 ms), reduced amplitude (0.9 mV), decreased conduction velocity (B Elbow-Wrist, 35 m/s), and decreased conduction velocity (A Elbow-B Elbow, 32 m/s).  The left median (across palm) sensory nerve showed no response (Wrist) and no response (Palm).  The left ulnar sensory nerve showed prolonged distal peak latency (6.0 ms), reduced amplitude (1.9 V), and decreased conduction velocity (Wrist-5th Digit, 23 m/s).  All remaining nerves (as indicated in the following tables) were within normal limits.    Needle evaluation of the left first dorsal interosseous muscle showed increased insertional activity, widespread spontaneous activity, very increased polyphasic potentials, and diminished recruitment.  The left biceps and the left deltoid muscles showed increased insertional activity, increased spontaneous activity, and diminished recruitment.  All remaining muscles (as indicated in the following table) showed no evidence of electrical instability.    Impression: The above electrodiagnostic study is ABNORMAL and reveals evidence of:  **Severe subacute C6 radiculopathy on the left.   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  This is in conjunction with prior electrodiagnostic study of the right upper extremity and right lower limb report. Cannot rule out an underlying median nerve neuropathy at the wrist but hard to distinguish with the level of neuropathy.  Recommendations: 1.  Follow-up with referring physician. 2.  Continue current management of symptoms. 3.  Suggest cervical MRI and surgical evaluation.  ___________________________ Naaman Plummer FAAPMR Board Certified, American Board  of Physical Medicine and Rehabilitation    Nerve Conduction Studies Anti Sensory Summary Table   Stim Site NR Peak (ms) Norm Peak (ms) P-T Amp (V) Norm P-T Amp Site1 Site2 Delta-P (ms) Dist (cm) Vel (m/s) Norm Vel (m/s)  Left Median Acr Palm Anti Sensory (2nd Digit)  31.2C  Wrist *NR  <3.6  >10 Wrist Palm  0.0    Palm *NR  <2.0          Left Radial Anti Sensory (Base 1st Digit)  30.6C  Wrist    2.4 <3.1 11.8  Wrist Base 1st Digit 2.4 0.0    Left Ulnar Anti Sensory (5th Digit)  31.2C  Wrist    *6.0 <3.7 *1.9 >15.0 Wrist 5th Digit 6.0 14.0 *23 >38   Motor Summary Table   Stim Site NR Onset (ms) Norm Onset (ms) O-P Amp (mV) Norm O-P Amp Site1 Site2 Delta-0 (ms) Dist (cm) Vel (m/s) Norm Vel (m/s)  Left Median Motor (Abd Poll Brev)  30.7C  Wrist    *6.9 <4.2 *3.4 >5 Elbow Wrist 8.2 27.0 *33 >50  Elbow    15.1  3.2         Left Ulnar Motor (Abd Dig Min)  31C  Wrist    *6.6 <4.2 *0.9 >3 B Elbow Wrist 7.2 25.5 *35 >53  B Elbow    13.8  0.2  A Elbow B Elbow 3.4 11.0 *32 >53  A Elbow    17.2  0.8          EMG   Side Muscle Nerve Root Ins Act Fibs Psw Amp Dur Poly Recrt Int Dennie Bible Comment  Left Abd Poll Brev Median C8-T1 Nml Nml Nml Nml Nml 0 Nml Nml   Left 1stDorInt Ulnar C8-T1 *Incr *4+ *4+ Nml Nml *3+ *Reduced Nml   Left PronatorTeres Median  C6-7 Nml Nml Nml Nml Nml 0 Nml Nml   Left Biceps Musculocut C5-6 *Incr *3+ *3+ Nml Nml 0 *Reduced Nml   Left Deltoid Axillary C5-6 *Incr *3+ *3+ Nml Nml 0 *Reduced Nml     Nerve Conduction Studies Anti Sensory Left/Right Comparison   Stim Site L Lat (ms) R Lat (ms) L-R Lat (ms) L Amp (V) R Amp (V) L-R Amp (%) Site1 Site2 L Vel (m/s) R Vel (m/s) L-R Vel (m/s)  Median Acr Palm Anti Sensory (2nd Digit)  31.2C  Wrist       Wrist Palm     Palm             Radial Anti Sensory (Base 1st Digit)  30.6C  Wrist 2.4   11.8   Wrist Base 1st Digit     Ulnar Anti Sensory (5th Digit)  31.2C  Wrist *6.0   *1.9   Wrist 5th Digit *23     Motor  Left/Right Comparison   Stim Site L Lat (ms) R Lat (ms) L-R Lat (ms) L Amp (mV) R Amp (mV) L-R Amp (%) Site1 Site2 L Vel (m/s) R Vel (m/s) L-R Vel (m/s)  Median Motor (Abd Poll Brev)  30.7C  Wrist *6.9   *3.4   Elbow Wrist *33    Elbow 15.1   3.2         Ulnar Motor (Abd Dig Min)  31C  Wrist *6.6   *0.9   B Elbow Wrist *35    B Elbow 13.8   0.2   A Elbow B Elbow *32    A Elbow 17.2   0.8            Waveforms:

## 2022-01-18 ENCOUNTER — Ambulatory Visit (INDEPENDENT_AMBULATORY_CARE_PROVIDER_SITE_OTHER): Payer: Medicare Other | Admitting: Family

## 2022-01-18 ENCOUNTER — Telehealth (HOSPITAL_COMMUNITY): Payer: Self-pay | Admitting: Emergency Medicine

## 2022-01-18 ENCOUNTER — Telehealth (HOSPITAL_COMMUNITY): Payer: Self-pay | Admitting: Cardiology

## 2022-01-18 ENCOUNTER — Other Ambulatory Visit (HOSPITAL_COMMUNITY): Payer: Self-pay | Admitting: Emergency Medicine

## 2022-01-18 ENCOUNTER — Encounter: Payer: Self-pay | Admitting: Family

## 2022-01-18 DIAGNOSIS — M542 Cervicalgia: Secondary | ICD-10-CM

## 2022-01-18 DIAGNOSIS — I2510 Atherosclerotic heart disease of native coronary artery without angina pectoris: Secondary | ICD-10-CM | POA: Diagnosis not present

## 2022-01-18 DIAGNOSIS — R29898 Other symptoms and signs involving the musculoskeletal system: Secondary | ICD-10-CM | POA: Diagnosis not present

## 2022-01-18 DIAGNOSIS — R202 Paresthesia of skin: Secondary | ICD-10-CM | POA: Diagnosis not present

## 2022-01-18 NOTE — Progress Notes (Signed)
Office Visit Note   Patient: Richard [name]           Date of Birth: 1961/01/18           MRN: [id number] Visit Date: 01/18/2022              Requested by: [name] Catalina, PA-C [id number] [place],  [place] [address] PCP: [name] Catalina, PA-C  Chief Complaint  Patient presents with   Left Arm - Weakness      HPI: The patient is a 61 year old gentleman who is seen today in follow-up after having EMGs and nerve conduction studies with Dr. Alvester Morin on July 18 for left upper extremity weakness and numbness.  Patient continues to complain of significant weakness in the left upper extremity he states that he cannot lift very much weight with this hand he has numbness worst in the long finger and difficulty with flexion of his 5th finger on the left. Overall feels his symptoms are worsening  He is right-hand dominant.   EMG significant for: **Severe subacute C6 radiculopathy on the left.   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  This is in conjunction with prior electrodiagnostic study of the right upper extremity and right lower limb report. Cannot rule out an underlying median nerve neuropathy at the wrist but hard to distinguish with the level of neuropathy.  Assessment & Plan: Visit Diagnoses:  1. Neck pain   2. Paresthesia of skin   3. Weakness of left arm     Plan: We will send today for cervical MRI scan.  We will plan to have him follow-up with neurosurgery referred him to neurosurgery today as well.  Follow-Up Instructions: No follow-ups on file.   Back Exam   Tenderness  The patient is experiencing no tenderness.   Comments:  + spurling   Left Hand Exam   Muscle Strength  Grip:  3/5       Patient is alert, oriented, no adenopathy, well-dressed, normal affect, normal respiratory effort. The patient has atrophy of the muscle in the first webspace.  Weakness with grip strength on  the left.  Does have good extension strength.  Imaging: No results found. No images are attached to the encounter.  Labs: Lab Results  Component Value Date   HGBA1C 10.3 (H) 01/17/2021   HGBA1C 10.4 (H) 10/31/2017   LABURIC 8.2 09/22/2019   REPTSTATUS 01/22/2021 FINAL 01/16/2021   CULT  01/16/2021    NO GROWTH 6 DAYS Performed at [facility], [address] [place], [place] [address]    [facility] STREPTOCOCCUS GROUP G 01/15/2021     Lab Results  Component Value Date   ALBUMIN 3.5 09/22/2021   ALBUMIN 1.8 (L) 02/08/2021   ALBUMIN 1.7 (L) 01/28/2021   PREALBUMIN 8.5 (L) 01/16/2021    Lab Results  Component Value Date   MG 2.0 09/22/2021   MG 2.7 (H) 01/16/2021   MG 2.5 (H) 01/15/2021   No results found for: "VD25OH"  Lab Results  Component Value Date   PREALBUMIN 8.5 (L) 01/16/2021      Latest Ref Rng & Units 09/22/2021   11:42 AM 03/24/2021   12:08 PM 03/04/2021    3:24 AM  CBC EXTENDED  WBC 4.0 - 10.5 K/uL 5.5  5.6  15.1   RBC 4.22 - 5.81 MIL/uL 5.56  4.55  2.92   Hemoglobin 13.0 - 17.0 g/dL 33.2  95.1  8.3  HCT 39.0 - 52.0 % 53.1  41.2  27.0   Platelets 150 - 400 K/uL 224  284  482      There is no height or weight on file to calculate BMI.  Orders:  Orders Placed This Encounter  Procedures   MR Cervical Spine w/o contrast   Ambulatory referral to Neurosurgery   No orders of the defined types were placed in this encounter.    Procedures: No procedures performed  Clinical Data: No additional findings.  ROS:  All other systems negative, except as noted in the HPI. Review of Systems  Objective: Vital Signs: There were no vitals taken for this visit.  Specialty Comments:  12/12/2019 EMGNCS right upper and right lower study  Testing Results:  NCS: Right sural sensory, ulnar sensory and median sensory motor responses are normal. Right peroneal motor response at EDB is absent but present at tibialis anterior with normal conduction  velocity. Right ulnar motor response is markedly low, and has marked drop in conduction velocity across the elbow and has slight distal latency prolongation as well. EMG: Patient declined EMG study. Ultrasound: Sonographic evaluation of right median nerve demonstrated diffuse increased cross-sectional area but predominantly at the wrist with reduced echogenicity and mobility but normal vascularity. Similarly right ulnar nerve demonstrated diffuse increased cross-sectional area but more severe at the elbow. _____________________________________________________________________  Conclusion:  This study is abnormal but limited due to lack of EMG data. 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 _____________________________________________________________________ Clinical Note: Findings were reviewed with the patient who expressed understanding.   I reviewed the waveforms and ultrasound images and edited the resident's diagnostic interpretation. I agree with the findings without further amendments.   Examined by (electronic signature): Electronically signed by: Titus Mould, MD 12/12/2019 2:05 PM  PMFS History: Patient Active Problem List   Diagnosis Date Noted   Hyperlipidemia 09/09/2021   Wound dehiscence 03/02/2021   Subacute osteomyelitis, right ankle and foot ([place])    Sleep disturbance    Essential hypertension    Diabetic peripheral neuropathy ([place])    Labile blood glucose    Left below-knee amputee ([place]) 02/07/2021   PAD (peripheral artery disease) ([place]) 02/01/2021   Below-knee amputation of left lower extremity ([place]) 01/27/2021   S/P BKA (below knee amputation), left (HCC)    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[place])    SOB (shortness of breath)    Open wound    Bacteremia    Chronic ulcer of left ankle (HCC)    Severe sepsis without septic shock (HCC)    COPD exacerbation ([place]) [id number]   Chronic systolic CHF (congestive heart failure) ([place]) 01/15/2021   Acute on chronic respiratory failure with hypoxia and hypercapnia ([place]) 01/15/2021   Acute renal failure superimposed on stage 3 chronic kidney disease ([place]) 01/15/2021   Elevated troponin 01/15/2021   Hyponatremia 01/15/2021   CHF exacerbation ([place]) 10/17/2020   Acute on chronic systolic CHF (congestive heart failure) ([place]) 10/16/2020   Acute on chronic combined systolic and diastolic CHF (congestive heart failure) ([place]) 03/15/2018   S/P right and left heart catheterization 11/02/2017   Panic attacks 11/02/2017   Non-STEMI (non-ST elevated myocardial infarction) ([place])    COPD (chronic obstructive pulmonary disease) ([place])  Hypertension    CKD (chronic kidney disease)    Single hamartoma of lung (HCC)    Presence of implantable cardioverter-defibrillator (ICD)    GERD (gastroesophageal reflux disease)    Diabetes mellitus with diabetic neuropathy, with long-term current use of insulin (HCC)    Past Medical History:  Diagnosis Date   AICD (automatic cardioverter/defibrillator) present 01/11/2017   Anxiety    CHF (congestive heart failure) (HCC)    CKD (chronic kidney disease)    COPD (chronic obstructive pulmonary disease) (HCC)    never smoker, industrial exposure   Diabetes mellitus with diabetic neuropathy, with long-term current use of insulin (HCC)    GERD (gastroesophageal reflux disease)    High cholesterol    History of gout    "take RX qd" (11/01/2017)   Hypertension    Nonobstructive atherosclerosis of coronary artery    On home oxygen therapy    "2L prn" (11/01/2017)   Single hamartoma of lung (HCC)    LLL, present for years   Systolic HF (heart failure) ([place])     Family History  Problem  Relation Age of Onset   Hypertension Mother    Diabetes Mother    Hypertension Father    Diabetes Father    Diabetes Sister    Diabetes Brother     Past Surgical History:  Procedure Laterality Date   ABDOMINAL AORTOGRAM W/LOWER EXTREMITY N/A 02/01/2021   Procedure: ABDOMINAL AORTOGRAM W/LOWER EXTREMITY;  Surgeon: Serafina [place], MD;  Location: [facility];  Service: Cardiovascular;  Laterality: N/A;   AMPUTATION Left 01/21/2021   Procedure: AMPUTATION BELOW KNEE;  Surgeon: Newt Minion, MD;  Location: [facility];  Service: Orthopedics;  Laterality: Left;   AMPUTATION Right 02/04/2021   Procedure: RIGHT FOOT 1ST AND 2ND RAY AMPUTATION;  Surgeon: Newt Minion, MD;  Location: [facility];  Service: Orthopedics;  Laterality: Right;   AMPUTATION Right 03/02/2021   Procedure: RIGHT BELOW KNEE AMPUTATION;  Surgeon: Newt Minion, MD;  Location: [facility];  Service: Orthopedics;  Laterality: Right;   APPLICATION OF WOUND VAC Left 01/21/2021   Procedure: APPLICATION OF WOUND VAC;  Surgeon: Newt Minion, MD;  Location: [facility];  Service: Orthopedics;  Laterality: Left;   APPLICATION OF WOUND VAC  02/04/2021   Procedure: APPLICATION OF WOUND VAC;  Surgeon: Newt Minion, MD;  Location: [facility];  Service: Orthopedics;;   APPLICATION OF WOUND VAC  03/02/2021   Procedure: APPLICATION OF WOUND VAC;  Surgeon: Newt Minion, MD;  Location: [facility];  Service: Orthopedics;;   CATARACT EXTRACTION W/ INTRAOCULAR LENS IMPLANTW/ TRABECULECTOMY Left ~ 2013   "may have done this when I had my other eye OR"   [place] N/A 11/02/2017   Procedure: CORONARY STENT INTERVENTION;  Surgeon: Larey Dresser, MD;  Location: [facility];  Service: Cardiovascular;  Laterality: N/A;   CORONARY STENT INTERVENTION N/A 11/02/2017   Procedure: CORONARY STENT INTERVENTION;  Surgeon: Belva Crome, MD;  Location: [facility];  Service: Cardiovascular;  Laterality: N/A;   EYE SURGERY Left ~  2013   "fell on something in basement; stuck in my eye"   ICD IMPLANT     Medtronic Dual Chamber 01/11/17   MULTIPLE TOOTH EXTRACTIONS     "pulled all my top teeth"   PERIPHERAL VASCULAR BALLOON ANGIOPLASTY  02/01/2021   Procedure: PERIPHERAL VASCULAR BALLOON ANGIOPLASTY;  Surgeon: Serafina [place], MD;  Location: [facility]  INVASIVE CV LAB;  Service: Cardiovascular;;  [place] [address] / PT   RIGHT/LEFT HEART CATH AND CORONARY ANGIOGRAPHY N/A 11/02/2017   Procedure: RIGHT/LEFT HEART CATH AND CORONARY ANGIOGRAPHY;  Surgeon: Laurey Morale, MD;  Location: [facility]AS[facility] LAB;  Service: Cardiovascular;  Laterality: N/A;   TEE WITHOUT CARDIOVERSION N/A 01/20/2021   Procedure: TRANSESOPHAGEAL ECHOCARDIOGRAM (TEE);  Surgeon: Laurey Morale, MD;  Location: [facility];  Service: Cardiovascular;  Laterality: N/A;   Social History   Occupational History   Occupation: disability    Comment: stopped working in 2000 d/t occupational exposures  Tobacco Use   Smoking status: Former    Types: Cigarettes   Smokeless tobacco: Never   Tobacco comments:    "smoked when I drank"  Vaping Use   Vaping Use: Never used  Substance and Sexual Activity   Alcohol use: Not Currently    Comment: 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

## 2022-01-18 NOTE — Telephone Encounter (Signed)
Received follow up text for Richard [name] regarding 9lb weight gain.  Instructions give to do 40mg . Torsemide BID for next two days.  Contacted Richard [name] to advise of same.  I anticipated  the change and left the Torsemide bottle out and called him and instructed him to add two extra pills to the evening regimen for the next two days.  (40mg  BID x 2 days)  Advised him if his weight doesn't come down he will need followup appointment at clinic.

## 2022-01-18 NOTE — Telephone Encounter (Signed)
Richard [name] with para medicine left message during patients home visit  Reports weight is up x 9 lbs since last visit NO SOB, chest pain or edema  Medication compliance or vitals not included

## 2022-01-18 NOTE — Progress Notes (Signed)
Paramedicine Encounter    Patient ID: Richard [name], male    DOB: August 14, 1960, 61 y.o.   MRN: [id number]   There were no vitals taken for this visit. Weight yesterday-not taken Last visit weight-234lb  ATF Mr. Cuff A&O x 4, skin W&D w/ good color.  Pt has no complaints of chest pain or SOB.  His weight is up 9lb.  Lung sounds clear and equal bilat.  No edema noted.  No JVD.  He has not done well in taking his meds this past week.  He's missing all his noon doses of his Hydralazine and a majority of her evening medicines.  Continue to impress on him the importance of med compliance.  Message sent of HF Clinic regarding possible med changes due to weight gain.  Msg received from Chantel of HF team to increase his Torsemide to 40mg  for the next two days and if weight doesn't come down he will need folluw up.   Home visit complete.  Patient Care Team: as PCP - General (Internal Medicine) Olevia Perches, MD as PCP - Advanced Heart Failure (Cardiology) Laurey Morale, MD as PCP - Electrophysiology (Cardiology) Duke Salvia, LCSW as Social Worker (Licensed Clinical Social Worker)  Patient Active Problem List   Diagnosis Date Noted   Hyperlipidemia 09/09/2021   Wound dehiscence 03/02/2021   Subacute osteomyelitis, right ankle and foot (HCC)    Sleep disturbance    Essential hypertension    Diabetic peripheral neuropathy (HCC)    Labile blood glucose    Left below-knee amputee (HCC) 02/07/2021   PAD (peripheral artery disease) (HCC) 02/01/2021   Below-knee amputation of left lower extremity (HCC) 01/27/2021   S/P BKA (below knee amputation), left (HCC)    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HCC)    SOB (shortness of breath)    Open wound    Bacteremia    Chronic ulcer of left ankle (HCC)    Severe sepsis without septic shock (HCC)    COPD exacerbation (HCC) 01/15/2021   Chronic systolic CHF (congestive heart failure) (HCC) 01/15/2021   Acute on chronic respiratory  failure with hypoxia and hypercapnia (HCC) 01/15/2021   Acute renal failure superimposed on stage 3 chronic kidney disease (HCC) 01/15/2021   Elevated troponin 01/15/2021   Hyponatremia 01/15/2021   CHF exacerbation (HCC) 10/17/2020   Acute on chronic systolic CHF (congestive heart failure) (HCC) 10/16/2020   Acute on chronic combined systolic and diastolic CHF (congestive heart failure) (HCC) 03/15/2018   S/P right and left heart catheterization 11/02/2017   Panic attacks 11/02/2017   Non-STEMI (non-ST elevated myocardial infarction) (HCC)    COPD (chronic obstructive pulmonary disease) (HCC)    Hypertension    CKD (chronic kidney disease)    Single hamartoma of lung (HCC)    Presence of implantable cardioverter-defibrillator (ICD)    GERD (gastroesophageal reflux disease)    Diabetes mellitus with diabetic neuropathy, with long-term current use of insulin (HCC)     Current Outpatient Medications:    allopurinol (ZYLOPRIM) 100 MG tablet, Take 1 tablet (100 mg total) by mouth daily., Disp: 30 tablet, Rfl: 3   aspirin 81 MG chewable tablet, Chew 81 mg by mouth daily., Disp: , Rfl:    atorvastatin (LIPITOR) 40 MG tablet, TAKE ONE TABLET BY MOUTH DAILY, Disp: 90 tablet, Rfl: 3   bisoprolol (ZEBETA) 5 MG tablet, TAKE HALF TABLET BY MOUTH DAILY, Disp: 30 tablet, Rfl: 11   clopidogrel (PLAVIX) 75 MG tablet,  TAKE ONE TABLET BY MOUTH DAILY, Disp: 30 tablet, Rfl: 11   digoxin (LANOXIN) 0.125 MG tablet, Take 1 tablet (0.125 mg total) by mouth daily., Disp: 60 tablet, Rfl: 3   docusate sodium (COLACE) 100 MG capsule, Take 1 capsule (100 mg total) by mouth daily., Disp: 10 capsule, Rfl: 0   DULoxetine (CYMBALTA) 60 MG capsule, Take 1 capsule (60 mg total) by mouth daily., Disp: 30 capsule, Rfl: 2   empagliflozin (JARDIANCE) 25 MG TABS tablet, Take 25 mg by mouth daily., Disp: , Rfl:    fenofibrate (TRICOR) 145 MG tablet, Take 145 mg by mouth daily., Disp: , Rfl:    gabapentin (NEURONTIN) 400 MG  capsule, Take 400 mg by mouth 3 (three) times daily., Disp: , Rfl:    hydrALAZINE (APRESOLINE) 50 MG tablet, Take 1.5 tablets (75 mg total) by mouth 3 (three) times daily., Disp: 540 tablet, Rfl: 2   insulin regular human CONCENTRATED (HUMULIN R U-500 KWIKPEN) 500 UNIT/ML KwikPen, Inject into the skin., Disp: , Rfl:    isosorbide mononitrate (IMDUR) 30 MG 24 hr tablet, Take 0.5 tablets (15 mg total) by mouth daily. Do not take if planning to use Viagra, Disp: 30 tablet, Rfl: 11   Magnesium Oxide 200 MG TABS, Take 2 tablets (400 mg total) by mouth daily., Disp: 60 tablet, Rfl: 11   sacubitril-valsartan (ENTRESTO) 24-26 MG, Take 1 tablet by mouth 2 (two) times daily., Disp: 60 tablet, Rfl: 6   sildenafil (VIAGRA) 50 MG tablet, Take 1 tablet (50 mg total) by mouth daily as needed for erectile dysfunction., Disp: 10 tablet, Rfl: 0   spironolactone (ALDACTONE) 25 MG tablet, TAKE HALF TABLET BY MOUTH DAILY, Disp: 30 tablet, Rfl: 3   torsemide (DEMADEX) 20 MG tablet, Take 2 tablets (40 mg total) by mouth daily., Disp: 30 tablet, Rfl: 6   zinc sulfate 220 (50 Zn) MG capsule, Take 1 capsule (220 mg total) by mouth daily., Disp: 30 capsule, Rfl: 0   albuterol (VENTOLIN HFA) 108 (90 Base) MCG/ACT inhaler, Inhale 1-2 puffs into the lungs every 4 (four) hours as needed for wheezing or shortness of breath. (Patient not taking: Reported on 01/11/2022), Disp: 8 g, Rfl: 1   Fluticasone Furoate-Vilanterol (BREO ELLIPTA IN), Inhale into the lungs daily as needed. (Patient not taking: Reported on 01/11/2022), Disp: , Rfl:    insulin glargine-yfgn (SEMGLEE, YFGN,) 100 UNIT/ML injection, Inject 0.08 mLs (8 Units total) into the skin 2 (two) times daily. (Patient not taking: Reported on 01/11/2022), Disp: 10 mL, Rfl: 11   insulin regular (NOVOLIN R) 100 units/mL injection, Inject into the skin as needed for high blood sugar. As per sliding scale (Patient not taking: Reported on 01/11/2022), Disp: , Rfl:    Multiple Vitamin  (MULTIVITAMIN WITH MINERALS) TABS tablet, Take 1 tablet by mouth daily. (Patient not taking: Reported on 01/18/2022), Disp: , Rfl:  Allergies  Allergen Reactions   Aspirin Itching   Codeine Hives   Coconut (Cocos Nucifera) Itching      Social History   Socioeconomic History   Marital status: Divorced    Spouse name: Not on file   Number of children: Not on file   Years of education: Not on file   Highest education level: Not on file  Occupational History   Occupation: disability    Comment: stopped working in 2000 d/t occupational exposures  Tobacco Use   Smoking status: Former    Types: Cigarettes   Smokeless tobacco: Never   Tobacco comments:    "  smoked when I drank"  Vaping Use   Vaping Use: Never used  Substance and Sexual Activity   Alcohol use: Not Currently    Comment: 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  Other Topics Concern   Not on file  Social History Narrative   Patient lives at home with wife and some of his 10 children. He self-administers his own medications.   Social Determinants of Health   Financial Resource Strain: Low Risk  (09/22/2021)   Overall Financial Resource Strain (CARDIA)    Difficulty of Paying Living Expenses: Not hard at all  Food Insecurity: No Food Insecurity (09/22/2021)   Hunger Vital Sign    Worried About Running Out of Food in the Last Year: Never true    Ran Out of Food in the Last Year: Never true  Transportation Needs: Unmet Transportation Needs (09/22/2021)   PRAPARE - Hydrologist (Medical): Yes    Lack of Transportation (Non-Medical): Yes  Physical Activity: Not on file  Stress: Not on file  Social Connections: Not on file  Intimate Partner Violence: Not on file    Physical Exam      Future Appointments  Date Time Provider [place]  02/16/2022 12:00 PM Larey Dresser, MD [facility] None  02/28/2022  7:20 AM  [facility] DEVICE REMOTES [facility]  05/30/2022  7:20 AM [facility] DEVICE REMOTES [facility]  08/29/2022  7:20 AM [facility] DEVICE REMOTES [facility]  11/28/2022  7:20 AM [facility] DEVICE REMOTES [facility]  02/27/2023  7:20 AM [facility] DEVICE REMOTES [facility]       [name], [facility] Paramedic  01/18/22

## 2022-01-19 NOTE — Telephone Encounter (Signed)
Per VO Brittainy Simmons increase torsemide to 40 mg twice a day for 2 days, -if weight does not trend down will need ov to evaluate  Pt aware via Emelia Salisbury with para medicine

## 2022-01-25 ENCOUNTER — Other Ambulatory Visit (HOSPITAL_COMMUNITY): Payer: Self-pay | Admitting: Emergency Medicine

## 2022-01-25 NOTE — Progress Notes (Signed)
Paramedicine Encounter    Patient ID: Richard [name], male    DOB: August 23, 1960, 61 y.o.   MRN: [id number]   BP (!) 120/90 (BP Location: Right Arm, Patient Position: Sitting, Cuff Size: Normal)   Pulse 60   Resp 16   Wt 236 lb (107 kg)   SpO2 98%   BMI 27.99 kg/m  Weight yesterday-not taken Last visit weight-243 CBG 174 ATF Mr. Rouillard still in bed this morning.  His brother-in-law let me in and I went ahead and began his medication reconciliation.  He has only taken 5 a.m. doses of his meds and none of his noon or evening doses.  He states, "I'm tired of taking all this medicine."  We discussed the fact if he does not take his medications as directed he could easily end up back in the hospital and that's what we want to avoid.  He continues to c/o no one in the house helping him.  He is having to try and cook himself food and has multiple [employer] on his hands. Pt denies chest pain or SOB.  Lung sounds clear and equal bilat.  No edema noted and pt is down 6lbs.  Med box reconciled.  Home visit complete.  Patient Care Team: Olevia Perches as PCP - General (Internal Medicine) Laurey Morale, MD as PCP - Advanced Heart Failure (Cardiology) Duke Salvia, MD as PCP - Electrophysiology (Cardiology) Burna Sis, LCSW as Social Worker (Licensed Clinical Social Worker)  Patient Active Problem List   Diagnosis Date Noted   Hyperlipidemia 09/09/2021   Wound dehiscence 03/02/2021   Subacute osteomyelitis, right ankle and foot (HCC)    Sleep disturbance    Essential hypertension    Diabetic peripheral neuropathy (HCC)    Labile blood glucose    Left below-knee amputee (HCC) 02/07/2021   PAD (peripheral artery disease) (HCC) 02/01/2021   Below-knee amputation of left lower extremity (HCC) 01/27/2021   S/P BKA (below knee amputation), left (HCC)    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HCC)    SOB (shortness of breath)    Open wound    Bacteremia    Chronic ulcer of left ankle (HCC)     Severe sepsis without septic shock (HCC)    COPD exacerbation (HCC) 01/15/2021   Chronic systolic CHF (congestive heart failure) (HCC) 01/15/2021   Acute on chronic respiratory failure with hypoxia and hypercapnia (HCC) 01/15/2021   Acute renal failure superimposed on stage 3 chronic kidney disease (HCC) 01/15/2021   Elevated troponin 01/15/2021   Hyponatremia 01/15/2021   CHF exacerbation (HCC) 10/17/2020   Acute on chronic systolic CHF (congestive heart failure) (HCC) 10/16/2020   Acute on chronic combined systolic and diastolic CHF (congestive heart failure) (HCC) 03/15/2018   S/P right and left heart catheterization 11/02/2017   Panic attacks 11/02/2017   Non-STEMI (non-ST elevated myocardial infarction) (HCC)    COPD (chronic obstructive pulmonary disease) (HCC)    Hypertension    CKD (chronic kidney disease)    Single hamartoma of lung (HCC)    Presence of implantable cardioverter-defibrillator (ICD)    GERD (gastroesophageal reflux disease)    Diabetes mellitus with diabetic neuropathy, with long-term current use of insulin (HCC)     Current Outpatient Medications:    allopurinol (ZYLOPRIM) 100 MG tablet, Take 1 tablet (100 mg total) by mouth daily., Disp: 30 tablet, Rfl: 3   aspirin 81 MG chewable tablet, Chew 81 mg by mouth daily., Disp: , Rfl:  atorvastatin (LIPITOR) 40 MG tablet, TAKE ONE TABLET BY MOUTH DAILY, Disp: 90 tablet, Rfl: 3   bisoprolol (ZEBETA) 5 MG tablet, TAKE HALF TABLET BY MOUTH DAILY, Disp: 30 tablet, Rfl: 11   clopidogrel (PLAVIX) 75 MG tablet, TAKE ONE TABLET BY MOUTH DAILY, Disp: 30 tablet, Rfl: 11   digoxin (LANOXIN) 0.125 MG tablet, Take 1 tablet (0.125 mg total) by mouth daily., Disp: 60 tablet, Rfl: 3   docusate sodium (COLACE) 100 MG capsule, Take 1 capsule (100 mg total) by mouth daily., Disp: 10 capsule, Rfl: 0   DULoxetine (CYMBALTA) 60 MG capsule, Take 1 capsule (60 mg total) by mouth daily., Disp: 30 capsule, Rfl: 2   empagliflozin (JARDIANCE)  25 MG TABS tablet, Take 25 mg by mouth daily., Disp: , Rfl:    fenofibrate (TRICOR) 145 MG tablet, Take 145 mg by mouth daily., Disp: , Rfl:    gabapentin (NEURONTIN) 400 MG capsule, Take 400 mg by mouth 3 (three) times daily., Disp: , Rfl:    hydrALAZINE (APRESOLINE) 50 MG tablet, Take 1.5 tablets (75 mg total) by mouth 3 (three) times daily., Disp: 540 tablet, Rfl: 2   insulin regular human CONCENTRATED (HUMULIN R U-500 KWIKPEN) 500 UNIT/ML KwikPen, Inject into the skin., Disp: , Rfl:    isosorbide mononitrate (IMDUR) 30 MG 24 hr tablet, Take 0.5 tablets (15 mg total) by mouth daily. Do not take if planning to use Viagra, Disp: 30 tablet, Rfl: 11   Magnesium Oxide 200 MG TABS, Take 2 tablets (400 mg total) by mouth daily., Disp: 60 tablet, Rfl: 11   sacubitril-valsartan (ENTRESTO) 24-26 MG, Take 1 tablet by mouth 2 (two) times daily., Disp: 60 tablet, Rfl: 6   sildenafil (VIAGRA) 50 MG tablet, Take 1 tablet (50 mg total) by mouth daily as needed for erectile dysfunction., Disp: 10 tablet, Rfl: 0   spironolactone (ALDACTONE) 25 MG tablet, TAKE HALF TABLET BY MOUTH DAILY, Disp: 30 tablet, Rfl: 3   torsemide (DEMADEX) 20 MG tablet, Take 2 tablets (40 mg total) by mouth daily., Disp: 30 tablet, Rfl: 6   zinc sulfate 220 (50 Zn) MG capsule, Take 1 capsule (220 mg total) by mouth daily., Disp: 30 capsule, Rfl: 0   albuterol (VENTOLIN HFA) 108 (90 Base) MCG/ACT inhaler, Inhale 1-2 puffs into the lungs every 4 (four) hours as needed for wheezing or shortness of breath. (Patient not taking: Reported on 01/11/2022), Disp: 8 g, Rfl: 1   Fluticasone Furoate-Vilanterol (BREO ELLIPTA IN), Inhale into the lungs daily as needed. (Patient not taking: Reported on 01/11/2022), Disp: , Rfl:    insulin glargine-yfgn (SEMGLEE, YFGN,) 100 UNIT/ML injection, Inject 0.08 mLs (8 Units total) into the skin 2 (two) times daily. (Patient not taking: Reported on 01/11/2022), Disp: 10 mL, Rfl: 11   insulin regular (NOVOLIN R) 100  units/mL injection, Inject into the skin as needed for high blood sugar. As per sliding scale (Patient not taking: Reported on 01/11/2022), Disp: , Rfl:    Multiple Vitamin (MULTIVITAMIN WITH MINERALS) TABS tablet, Take 1 tablet by mouth daily. (Patient not taking: Reported on 01/18/2022), Disp: , Rfl:  Allergies  Allergen Reactions   Aspirin Itching   Codeine Hives   Coconut (Cocos Nucifera) Itching      Social History   Socioeconomic History   Marital status: Divorced    Spouse name: Not on file   Number of children: Not on file   Years of education: Not on file   Highest education level: Not on file  Occupational History   Occupation: disability    Comment: stopped working in 2000 d/t occupational exposures  Tobacco Use   Smoking status: Former    Types: Cigarettes   Smokeless tobacco: Never   Tobacco comments:    "smoked when I drank"  Vaping Use   Vaping Use: Never used  Substance and Sexual Activity   Alcohol use: Not Currently    Comment: 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  Other Topics Concern   Not on file  Social History Narrative   Patient lives at home with wife and some of his 10 children. He self-administers his own medications.   Social Determinants of Health   Financial Resource Strain: Low Risk  (09/22/2021)   Overall Financial Resource Strain (CARDIA)    Difficulty of Paying Living Expenses: Not hard at all  Food Insecurity: No Food Insecurity (09/22/2021)   Hunger Vital Sign    Worried About Running Out of Food in the Last Year: Never true    Ran Out of Food in the Last Year: Never true  Transportation Needs: Unmet Transportation Needs (09/22/2021)   PRAPARE - Administrator, Civil Service (Medical): Yes    Lack of Transportation (Non-Medical): Yes  Physical Activity: Not on file  Stress: Not on file  Social Connections: Not on file  Intimate Partner Violence: Not on  file    Physical Exam      Future Appointments  Date Time Provider Department Center  02/16/2022 12:00 PM Laurey Morale, MD [facility] None  02/28/2022  7:20 AM [facility] DEVICE REMOTES [facility]  05/30/2022  7:20 AM [facility] DEVICE REMOTES [facility]  08/29/2022  7:20 AM [facility] DEVICE REMOTES [facility]  11/28/2022  7:20 AM [facility] DEVICE REMOTES [facility]  02/27/2023  7:20 AM [facility] DEVICE REMOTES [facility]       Beatrix Shipper, EMT-Paramedic [phone] [facility] Paramedic  01/25/22

## 2022-02-01 ENCOUNTER — Other Ambulatory Visit (HOSPITAL_COMMUNITY): Payer: Self-pay | Admitting: Emergency Medicine

## 2022-02-01 NOTE — Progress Notes (Signed)
Paramedicine Encounter    Patient ID: Richard [name], male    DOB: 26-Oct-1960, 61 y.o.   MRN: [id number]   BP 118/80 (BP Location: Right Arm, Patient Position: Sitting, Cuff Size: Normal)   Pulse (!) 58   Wt 239 lb 4.8 oz (108.5 kg)   SpO2 98%   BMI 28.38 kg/m  Weight yesterday-not taken Last visit weight-236lb   ATF Richard [name] A&O x 4, skin W&D w/ good color.  Pt. Denies chest pain or SOB.  Lung sounds clear and equal bilat.  No edema noted.  Richard [name] has not taken his meds like he should  He takes his morning meds but then skips noon and evening meds.  We continue to discuss med compliance and the fact that the medications are for his benefit.  He thinks they make him sick.  Lots of chaos in the home and he doesn't seem to be getting support from family members and he talks about moving out on his own a lot because "my family only uses me for money."   Med box reconciled.  Home visit complete.    Patient Care Team: Rip Harbour as PCP - General (Internal Medicine) Larey Dresser, MD as PCP - Advanced Heart Failure (Cardiology) Deboraha Sprang, MD as PCP - Electrophysiology (Cardiology) Jorge Ny, LCSW as Social Worker (Licensed Clinical Social Worker)  Patient Active Problem List   Diagnosis Date Noted   Hyperlipidemia 09/09/2021   Wound dehiscence 03/02/2021   Subacute osteomyelitis, right ankle and foot ([employer])    Sleep disturbance    Essential hypertension    Diabetic peripheral neuropathy ([place])    Labile blood glucose    Left below-knee amputee ([place]) 02/07/2021   PAD (peripheral artery disease) ([place]) 02/01/2021   Below-knee amputation of left lower extremity ([place]) 01/27/2021   S/P BKA (below knee amputation), left (HCC)    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[place])    SOB (shortness of breath)    Open wound    Bacteremia    Chronic ulcer of left ankle ([place])    Severe sepsis without septic shock (HCC)    COPD exacerbation ([place]) [id number]   Chronic systolic CHF  (congestive heart failure) ([place]) 01/15/2021   Acute on chronic respiratory failure with hypoxia and hypercapnia ([place]) 01/15/2021   Acute renal failure superimposed on stage 3 chronic kidney disease ([place]) 01/15/2021   Elevated troponin 01/15/2021   Hyponatremia 01/15/2021   CHF exacerbation ([place]) 10/17/2020   Acute on chronic systolic CHF (congestive heart failure) ([place]) 10/16/2020   Acute on chronic combined systolic and diastolic CHF (congestive heart failure) ([place]) 03/15/2018   S/P right and left heart catheterization 11/02/2017   Panic attacks 11/02/2017   Non-STEMI (non-ST elevated myocardial infarction) ([place])    COPD (chronic obstructive pulmonary disease) ([place])    Hypertension    CKD (chronic kidney disease)    Single hamartoma of lung ([place])    Presence of implantable cardioverter-defibrillator (ICD)    GERD (gastroesophageal reflux disease)    Diabetes mellitus with diabetic neuropathy, with long-term current use of insulin ([place])     Current Outpatient Medications:    allopurinol (ZYLOPRIM) 100 MG tablet, Take 1 tablet (100 mg total) by mouth daily., Disp: 30 tablet, Rfl: 3   aspirin 81 MG chewable tablet, Chew 81 mg by mouth daily., Disp: , Rfl:    atorvastatin (LIPITOR) 40 MG tablet, TAKE ONE TABLET BY MOUTH DAILY, Disp: 90 tablet, Rfl: 3  bisoprolol (ZEBETA) 5 MG tablet, TAKE HALF TABLET BY MOUTH DAILY, Disp: 30 tablet, Rfl: 11   clopidogrel (PLAVIX) 75 MG tablet, TAKE ONE TABLET BY MOUTH DAILY, Disp: 30 tablet, Rfl: 11   digoxin (LANOXIN) 0.125 MG tablet, Take 1 tablet (0.125 mg total) by mouth daily., Disp: 60 tablet, Rfl: 3   docusate sodium (COLACE) 100 MG capsule, Take 1 capsule (100 mg total) by mouth daily., Disp: 10 capsule, Rfl: 0   DULoxetine (CYMBALTA) 60 MG capsule, Take 1 capsule (60 mg total) by mouth daily., Disp: 30 capsule, Rfl: 2   empagliflozin (JARDIANCE) 25 MG TABS tablet, Take 25 mg by mouth daily., Disp: , Rfl:    fenofibrate (TRICOR) 145 MG tablet,  Take 145 mg by mouth daily., Disp: , Rfl:    gabapentin (NEURONTIN) 400 MG capsule, Take 400 mg by mouth 3 (three) times daily., Disp: , Rfl:    hydrALAZINE (APRESOLINE) 50 MG tablet, Take 1.5 tablets (75 mg total) by mouth 3 (three) times daily., Disp: 540 tablet, Rfl: 2   insulin regular human CONCENTRATED (HUMULIN R U-500 KWIKPEN) 500 UNIT/ML KwikPen, Inject into the skin., Disp: , Rfl:    isosorbide mononitrate (IMDUR) 30 MG 24 hr tablet, Take 0.5 tablets (15 mg total) by mouth daily. Do not take if planning to use Viagra, Disp: 30 tablet, Rfl: 11   Magnesium Oxide 200 MG TABS, Take 2 tablets (400 mg total) by mouth daily., Disp: 60 tablet, Rfl: 11   sacubitril-valsartan (ENTRESTO) 24-26 MG, Take 1 tablet by mouth 2 (two) times daily., Disp: 60 tablet, Rfl: 6   sildenafil (VIAGRA) 50 MG tablet, Take 1 tablet (50 mg total) by mouth daily as needed for erectile dysfunction., Disp: 10 tablet, Rfl: 0   spironolactone (ALDACTONE) 25 MG tablet, TAKE HALF TABLET BY MOUTH DAILY, Disp: 30 tablet, Rfl: 3   torsemide (DEMADEX) 20 MG tablet, Take 2 tablets (40 mg total) by mouth daily., Disp: 30 tablet, Rfl: 6   zinc sulfate 220 (50 Zn) MG capsule, Take 1 capsule (220 mg total) by mouth daily., Disp: 30 capsule, Rfl: 0   albuterol (VENTOLIN HFA) 108 (90 Base) MCG/ACT inhaler, Inhale 1-2 puffs into the lungs every 4 (four) hours as needed for wheezing or shortness of breath. (Patient not taking: Reported on 01/11/2022), Disp: 8 g, Rfl: 1   Fluticasone Furoate-Vilanterol (BREO ELLIPTA IN), Inhale into the lungs daily as needed. (Patient not taking: Reported on 01/11/2022), Disp: , Rfl:    insulin glargine-yfgn (SEMGLEE, YFGN,) 100 UNIT/ML injection, Inject 0.08 mLs (8 Units total) into the skin 2 (two) times daily. (Patient not taking: Reported on 01/11/2022), Disp: 10 mL, Rfl: 11   insulin regular (NOVOLIN R) 100 units/mL injection, Inject into the skin as needed for high blood sugar. As per sliding scale (Patient  not taking: Reported on 01/11/2022), Disp: , Rfl:    Multiple Vitamin (MULTIVITAMIN WITH MINERALS) TABS tablet, Take 1 tablet by mouth daily. (Patient not taking: Reported on 01/18/2022), Disp: , Rfl:  Allergies  Allergen Reactions   Aspirin Itching   Codeine Hives   Coconut (Cocos Nucifera) Itching      Social History   Socioeconomic History   Marital status: Divorced    Spouse name: Not on file   Number of children: Not on file   Years of education: Not on file   Highest education level: Not on file  Occupational History   Occupation: disability    Comment: stopped working in 2000 d/t occupational exposures  Tobacco Use   Smoking status: Former    Types: Cigarettes   Smokeless tobacco: Never   Tobacco comments:    "smoked when I drank"  Vaping Use   Vaping Use: Never used  Substance and Sexual Activity   Alcohol use: Not Currently    Comment: 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  Other Topics Concern   Not on file  Social History Narrative   Patient lives at home with wife and some of his 10 children. He self-administers his own medications.   Social Determinants of Health   Financial Resource Strain: Low Risk  (09/22/2021)   Overall Financial Resource Strain (CARDIA)    Difficulty of Paying Living Expenses: Not hard at all  Food Insecurity: No Food Insecurity (09/22/2021)   Hunger Vital Sign    Worried About Running Out of Food in the Last Year: Never true    Ran Out of Food in the Last Year: Never true  Transportation Needs: Unmet Transportation Needs (09/22/2021)   PRAPARE - Administrator, Civil Service (Medical): Yes    Lack of Transportation (Non-Medical): Yes  Physical Activity: Not on file  Stress: Not on file  Social Connections: Not on file  Intimate Partner Violence: Not on file    Physical Exam      Future Appointments  Date Time Provider Department Center   02/07/2022 11:00 AM [facility]  02/16/2022 12:00 PM Laurey Morale, MD [facility] None  02/28/2022  7:20 AM [facility] DEVICE REMOTES [facility]  05/30/2022  7:20 AM [facility] DEVICE REMOTES [facility]  08/29/2022  7:20 AM [facility] DEVICE REMOTES [facility]  11/28/2022  7:20 AM [facility] DEVICE REMOTES [facility]  02/27/2023  7:20 AM [facility] DEVICE REMOTES [facility]       Beatrix Shipper, EMT-Paramedic [phone] [facility] Par[facility]ic  02/01/22

## 2022-02-07 ENCOUNTER — Ambulatory Visit (HOSPITAL_COMMUNITY): Admission: RE | Admit: 2022-02-07 | Payer: Medicare Other | Source: Ambulatory Visit

## 2022-02-09 ENCOUNTER — Other Ambulatory Visit (HOSPITAL_COMMUNITY): Payer: Self-pay

## 2022-02-09 NOTE — Progress Notes (Signed)
Paramedicine Encounter    Patient ID: Richard [name], male    DOB: [birth year], 61 y.o.   MRN: [id number]   Arrived for med rec visit for [name]. He reports he feels okay today, no medical complaints.   We reviewed meds and noted his pill box had several midday doses still in the box as well as a few evening doses still in box- meaning he missed these doses over the last week.   I stressed the importance of med compliance and he verbalized understanding.   I confirmed meds and filled pill box for one week.   No refills needed at this time.   He expressed the want to move out of his current living space due to him and his ex wife not getting along. He says his income is around $1000 but he can only afford around $500-$600/mo in rent. I advised I would have [name] print out a list of options in the [place] area from [employer] and give to him next week at his clinic visit. He agreed with this plan.   He reports no other needs at this time.  Home visit complete.   Maralyn Sago, EMT-Paramedic [phone] 02/09/2022        ACTION: Home visit completed

## 2022-02-15 ENCOUNTER — Other Ambulatory Visit (HOSPITAL_COMMUNITY): Payer: Self-pay | Admitting: Emergency Medicine
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
## 2022-02-16 ENCOUNTER — Other Ambulatory Visit (HOSPITAL_COMMUNITY): Payer: Self-pay | Admitting: Emergency Medicine

## 2022-02-16 ENCOUNTER — Ambulatory Visit (HOSPITAL_COMMUNITY)
Admission: RE | Admit: 2022-02-16 | Discharge: 2022-02-16 | Disposition: A | Discharge status: Home / Self Care | Payer: Medicare Other | Source: Ambulatory Visit | Attending: Cardiology | Admitting: Cardiology

## 2022-02-16 ENCOUNTER — Encounter (HOSPITAL_COMMUNITY): Payer: Self-pay | Admitting: Cardiology

## 2022-02-16 VITALS — BP 122/80 | HR 71 | Wt 238.0 lb

## 2022-02-16 DIAGNOSIS — I5022 Chronic systolic (congestive) heart failure: Secondary | ICD-10-CM

## 2022-02-16 LAB — DIGOXIN LEVEL: Digoxin Level: 0.7 ng/mL — ABNORMAL LOW (ref 0.8–2.0)

## 2022-02-16 LAB — BASIC METABOLIC PANEL
Anion gap: 8 (ref 5–15)
BUN: 31 mg/dL — ABNORMAL HIGH (ref 6–20)
CO2: 30 mmol/L (ref 22–32)
Calcium: 9.8 mg/dL (ref 8.9–10.3)
Chloride: 101 mmol/L (ref 98–111)
Creatinine, Ser: 1.62 mg/dL — ABNORMAL HIGH (ref 0.61–1.24)
GFR, Estimated: 49 mL/min — ABNORMAL LOW (ref >60)
Glucose, Bld: 171 mg/dL — ABNORMAL HIGH (ref 70–99)
Potassium: 4.2 mmol/L (ref 3.5–5.1)
Sodium: 139 mmol/L (ref 135–145)

## 2022-02-16 MED ORDER — BISOPROLOL FUMARATE 5 MG PO TABS: 5.0000 mg | ORAL_TABLET | Freq: Every day | ORAL | 3 refills | Status: DC

## 2022-02-16 MED ORDER — SPIRONOLACTONE 25 MG PO TABS: 25.0000 mg | ORAL_TABLET | Freq: Every day | ORAL | 3 refills | Status: AC

## 2022-02-16 NOTE — Patient Instructions (Signed)
INCREASE Bisoprolol to 5mg  daily.  INCREASE Spironolactone to 25mg  daily.  Labs done today, your results will be available in MyChart, we will contact you for abnormal readings.  Repeat blood work in 10 days  Your physician has requested that you have an echocardiogram. Echocardiography is a painless test that uses sound waves to create images of your heart. It provides your doctor with information about the size and shape of your heart and how well your heart's chambers and valves are working. This procedure takes approximately one hour. There are no restrictions for this procedure.  Please follow up with our heart failure pharmacist in 3 weeks  Your physician recommends that you schedule a follow-up appointment in: 2 months with the nurse practitioner and then 4 months with an echocardiogram with Dr.  If you have any questions or concerns before your next appointment please send a message through [facility] or call our office at [phone].    TO LEAVE A MESSAGE FOR THE NURSE SELECT OPTION 2, PLEASE LEAVE A MESSAGE INCLUDING: YOUR NAME DATE OF BIRTH CALL BACK NUMBER REASON FOR CALL**this is important as we prioritize the call backs  YOU WILL RECEIVE A CALL BACK THE SAME DAY AS LONG AS YOU CALL BEFORE 4:00 PM  At the [facility], you and your health needs are our priority. As part of our continuing mission to provide you with exceptional heart care, we have created designated Provider Care Teams. These Care Teams include your primary Cardiologist (physician) and Advanced Practice Providers (APPs- Physician Assistants and Nurse Practitioners) who all work together to provide you with the care you need, when you need it.   You may see any of the following providers on your designated Care Team at your next follow up: Dr Warren [address] Dr [phone], NP Arvilla Meres, Carron Curie [facility] [place], [facility] [place], PharmD   Please  be sure to bring in all your medications bottles to every appointment.
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
## 2022-02-16 NOTE — Progress Notes (Signed)
Paramedicine Encounter   Patient ID: Richard [name] , male,   DOB: [birth year],60 y.o.,  MRN: [id number]   Met patient in clinic today with provider.  Time spent with patient 40 minutes  Met with Richard [name] for his clinic appointment with Dr. Aundra Dubin. Richard [name] has no complaints of chest pain or SOB.  Lung sounds clear bilat.  Medication changes today increase Bisoprolol to 13m daily and increase Spironolactone to 27mdaily.  Pt's pill box currently contains 1/2 tablet and advised him to add another 1/2 table of each med to equal new dosage, pt states he understands same.   During today's visit, pt also met with Richard Soursnd she provided him information on placed to call for housing as he wishes to move from his home.  Visit complete    Richard RamusEMVernon/24/2023

## 2022-02-17 NOTE — Progress Notes (Signed)
[facility] Note   ID:  Richard [name], Richard [name] 06/10/1961, MRN [id number]   Provider location: [facility] Type of Visit: Established patient   PCP:  Robert Bellow, PA-C HF Cardiologist:  Dr. Aundra Dubin   HPI Richard [name] is a 61 y.o. male who has a history of chronic systolic HF s/p Medtronic ICD (12/2016), HTN, DM, and HLD.   Admitted [id number] with A/C systolic HF. Echo showed EF 15-20%. Advanced HF team was consulted. He diuresed 20 lbs with IV lasix, then transitioned [name] torsemide 40 mg daily. Underwent R/LHC showing a focal severe RCA stenosis treated with DES.  He did not, however, appear [name] have severe enough coronary disease [name] explain his cardiomyopathy. Cardiac index was low at 1.9.  HF meds were optimized. Beta blocker was not started due [name] low CI and soft BPs. He was discharged on home oxygen. Referred [name] cardiac rehab. DC weight: 260 lbs.   Echo in 2/20 showed EF 20-25% with severe LV dilation, moderate RV enlargement with moderately decreased RV systolic function.  CPX in 2/21 showed a severe functional limitation but it actually appeared [name] be primarily due [name] pulmonary issues.   Echo in 1/22 showed EF < 20% with severe LV dilation, normal RV size with moderately decreased systolic function, dilated IVC.   He was admitted in 4/22 with CHF.   TEE in 7/22 showed EF 20% with severe LV dilation, moderate RV enlargement with moderately decreased RV systolic function.   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  Today he returns for HF follow up. Weight up 2 lbs.  Walking with his prosthetics, no dyspnea walking on flat ground. No problem mowing grass.  Dyspnea with heavy lifting. No chest pain, no lightheadedness, no orthopnea/PND. Not using oxygen.   Medtronic device interrogation (personally reviewed): Stable thoracic impedance, no AF/VT.   Labs (10/19): K 3.9, creatinine 1.18  Labs (10/20):  BNP 160, K 3.8, creatinine 1.31 Labs (4/21): TGs 475, LDL 67, K 4.2, creatinine 1.8, urine and serum immunofixations negative.  Labs (11/21): LDL 42, HDL 27, K 4.3, creatinine 1.81 Labs (5/22): K 4.5, creatinine 1.46 Labs (11/19/20): SCr 2.0, 4.5  Labs (11/22): K 4.6, creatinine 1.15 Labs (3/22): K 4.3, creatinine 1.46. LDL 42, TG 68  Labs (5/23): K 4.5, creatinine 1.5 Labs (6/23): K 4.4, creatinine 1.47  PMH: 1. Chronic systolic CHF: Primarily nonischemic cardiomyopathy.  Has Medtronic ICD.   - Echo 5/19: EF 15-20%, grade 2 DD, mild MR, RV severely reduced, LA moderately dilated, RA mildly dilated, mild TR.  - RHC/LHC (5/19): 80% distal LAD stenosis, 80% proximal RCA stenosis treated with DES [name] pRCA. Mean RA 13, PA 69/31 mean 47, mean 24, CI 1.91, PVR 4.8 WU.  - Gynecomastia with spironolactone. - Echo (2/20): EF 20-25%, severe LV enlargement, moderate RV enlargement, moderately decreased RV systolic function.  - CPX (2/21): peak VO2 13.2, VE/VCO2 slope 24, RER 1.10. Severe functional limitation due [name] OHS with severe restrictive lung disease without significant HF limitation.  - PYP scan (7/21): Negative for TTR cardiac amyloidosis.  - Echo (1/22): EF < 20% with severe LV dilation, normal RV size with moderately decreased systolic function, dilated IVC.  - TEE (7/22): EF 20% with severe LV dilation, moderate RV enlargement with moderately decreased RV systolic function.  2. COPD: Uses 2L home oxygen. Never smoked, but apparently had significant occupational exposure.  It appears that he won  a lawsuit dealing with the occupational exposure-related COPD.  3. CAD: Cath in 5/17 with 60% RCA stenosis.  - LHC (5/19): 80% distal LAD, 80% proximal RCA.  DES [name] proximal RCA.  4. Polycythemia: Probably due [name] chronic hypoxemia.  5. Type II diabetes.  6. HTN 7. GERD 8. CKD: Stage 3.  Likely due [name] diabetes and HTN.  9. OSA: Unable [name] tolerate CPAP.  10. PAD: Right and left BKA 2022.  11.  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 Group B Strep bacteremia  Review of systems complete and found [name] be negative unless listed in HPI.    Current Outpatient Medications  Medication Sig Dispense Refill   albuterol (VENTOLIN HFA) 108 (90 Base) MCG/ACT inhaler Inhale 1-2 puffs into the lungs every 4 (four) hours as needed for wheezing or shortness of breath. 8 g 1   allopurinol (ZYLOPRIM) 100 MG tablet Take 1 tablet (100 mg total) by mouth daily. 30 tablet 3   aspirin 81 MG chewable tablet Chew 81 mg by mouth daily.     atorvastatin (LIPITOR) 40 MG tablet TAKE ONE TABLET BY MOUTH DAILY 90 tablet 3   clopidogrel (PLAVIX) 75 MG tablet TAKE ONE TABLET BY MOUTH DAILY 30 tablet 11   digoxin (LANOXIN) 0.125 MG tablet Take 1 tablet (0.125 mg total) by mouth daily. 60 tablet 3   docusate sodium (COLACE) 100 MG capsule Take 1 capsule (100 mg total) by mouth daily. 10 capsule 0   DULoxetine (CYMBALTA) 60 MG capsule Take 1 capsule (60 mg total) by mouth daily. 30 capsule 2   empagliflozin (JARDIANCE) 25 MG TABS tablet Take 25 mg by mouth daily.     fenofibrate (TRICOR) 145 MG tablet Take 145 mg by mouth daily.     Fluticasone Furoate-Vilanterol (BREO ELLIPTA IN) Inhale into the lungs daily as needed.     gabapentin (NEURONTIN) 400 MG capsule Take 400 mg by mouth 3 (three) times daily.     hydrALAZINE (APRESOLINE) 50 MG tablet Take 1.5 tablets (75 mg total) by mouth 3 (three) times daily. 540 tablet 2   isosorbide mononitrate (IMDUR) 30 MG 24 hr tablet Take 0.5 tablets (15 mg total) by mouth daily. Do not take if planning [name] use Viagra 30 tablet 11   Magnesium Oxide 200 MG TABS Take 2 tablets (400 mg total) by mouth daily. 60 tablet 11   sacubitril-valsartan (ENTRESTO) 24-26 MG Take 1 tablet by mouth 2 (two) times daily. 60 tablet 6   sildenafil (VIAGRA) 50 MG tablet Take 1 tablet (50 mg total) by mouth daily as needed for erectile dysfunction. 10 tablet 0   torsemide (DEMADEX) 20 MG tablet Take 2  tablets (40 mg total) by mouth daily. 30 tablet 6   zinc sulfate 220 (50 Zn) MG capsule Take 1 capsule (220 mg total) by mouth daily. 30 capsule 0   bisoprolol (ZEBETA) 5 MG tablet Take 1 tablet (5 mg total) by mouth daily. 90 tablet 3   Multiple Vitamin (MULTIVITAMIN WITH MINERALS) TABS tablet Take 1 tablet by mouth daily. (Patient not taking: Reported on 01/18/2022)     spironolactone (ALDACTONE) 25 MG tablet Take 1 tablet (25 mg total) by mouth daily. 90 tablet 3   No current facility-administered medications for this encounter.   Allergies  Allergen Reactions   Aspirin Itching   Codeine Hives   Coconut (Cocos Nucifera) Itching   Social History   Socioeconomic History   Marital status: Divorced    Spouse name: Not  on file   Number of children: Not on file   Years of education: Not on file   Highest education level: Not on file  Occupational History   Occupation: disability    Comment: stopped working in 2000 d/t occupational exposures  Tobacco Use   Smoking status: Former    Types: Cigarettes   Smokeless tobacco: Never   Tobacco comments:    "smoked when I drank"  Vaping Use   Vaping Use: Never used  Substance and Sexual Activity   Alcohol use: Not Currently    Comment: used [name] drink multiple cases of beer daily, quit 2018   Drug use: Never   Sexual activity: Not Currently  Other Topics Concern   Not on file  Social History Narrative   Patient lives at home with wife and some of his 10 children. He self-administers his own medications.   Social Determinants of Health   Financial Resource Strain: Low Risk  (09/22/2021)   Overall Financial Resource Strain (CARDIA)    Difficulty of Paying Living Expenses: Not hard at all  Food Insecurity: No Food Insecurity (09/22/2021)   Hunger Vital Sign    Worried About Running Out of Food in the Last Year: Never true    Ran Out of Food in the Last Year: Never true  Transportation Needs: Unmet Transportation Needs (09/22/2021)    PRAPARE - Administrator, Civil Service (Medical): Yes    Lack of Transportation (Non-Medical): Yes  Physical Activity: Not on file  Stress: Not on file  Social Connections: Not on file  Intimate Partner Violence: Not on file   Family History  Problem Relation Age of Onset   Hypertension Mother    Diabetes Mother    Hypertension Father    Diabetes Father    Diabetes Sister    Diabetes Brother    BP 122/80   Pulse 71   Wt 108 kg (238 lb)   SpO2 94%   BMI 28.22 kg/m   Wt Readings from Last 3 Encounters:  02/16/22 108 kg (238 lb)  02/16/22 108 kg (238 lb)  02/15/22 109 kg (240 lb 6.4 oz)   PHYSICAL EXAM: General: NAD Neck: No JVD, no thyromegaly or thyroid nodule.  Lungs: Clear [name] auscultation bilaterally with normal respiratory effort. CV: Nondisplaced PMI.  Heart regular S1/S2, no S3/S4, no murmur.  No peripheral edema.  No carotid bruit.  Normal pedal pulses.  Abdomen: Soft, nontender, no hepatosplenomegaly, no distention.  Skin: Intact without lesions or rashes.  Neurologic: Alert and oriented x 3.  Psych: Normal affect. Extremities: No clubbing or cyanosis. Bilateral BKAs HEENT: Normal.   ASSESSMENT & PLAN: 1. Chronic systolic CHF:  Primarily nonischemic cardiomyopathy.  Echo in 2017 with EF 15-20%.  Medtronic ICD. Echo 10/2017 EF 15-20%, severely dilated RV with severely decreased systolic function.  LHC/RHC 11/06/17 showed volume overload with 80% RCA stenosis.  Cardiac index low at 1.91. The degree of coronary disease does not explain his cardiomyopathy.  Echo in 2/20 showed severe LV dilation, EF 20-25%, moderately decreased RV systolic function.  PYP scan in 7/21 not suggestive of transthyretin amyloidosis. Echo in 1/22 showed EF < 20%, moderately decreased RV systolic function.  TEE in 7/22 with EF 20% with severe LV dilation, moderate RV enlargement with moderately decreased RV systolic function. NYHA class II symptoms, he is not volume overloaded by exam  or Optivol.  - Continue Imdur 15 mg daily and hydralazine 75 mg tid.  - Continue torsemide 40  mg daily.  - Continue Entresto 24-26 mg bid. - Continue empaglifozin.  - Increase spironolactone [name] 25 mg daily with BMET today and again in 10 days.   - Continue digoxin 0.125 mg daily, check level.  - Increase bisoprolol [name] 5 mg daily.  - Continue Paramedicine.  - I will arrange for repeat echo.  2. COPD: Never smoked, but apparently had significant occupational exposure.  It appears that he won a lawsuit dealing with the occupational exposure-related COPD. CPX in 2/21 showed severe functional limitation, but it appeared [name] be due [name] pulmonary abnormalities rather than HF. He was on home oxygen in the past but is not using it now.   - Followup with pulmonary.  3. CAD:  LHC in 5/19 with 80-90% proximal RCA stenosis treated with DES [name] RCA.  This did not cause his cardiomyopathy but is a large RCA. No chest pain.  - Continue ASA 81 daily.  - Continue atorvastatin, LDL 42 (3/23).  4. Polycythemia: Likely related [name] chronic hypoxia.  5. OSA: Cannot tolerate CPAP, wears oxygen at night.  6. DM: He is on empagliflozin.   7. Hypertriglyceridemia: Off Vascepa due [name] blood in stool that resolved.  - Continue fenofibrate 145 mg daily. TGs well controlled on recent LP, 68  8. PVCs: 3 day Zio (4/23) 1.3% PVC burden. If burden increases, consider AAD for suppression (would opt for mexiletine vs amiodarone due [name] COPD). 9. ED: Has prescription for sildenafil. Instructed NOT [name] take Imdur if planning on using sildenafil.   Followup 3 wks with HF pharmacist for med titration, see APP in 2 months.     Signed, Marca Ancona, MD  02/17/2022  [facility] [address] [facility] [place] [address] [phone] (office) [phone] (fax)

## 2022-02-20 NOTE — Progress Notes (Signed)
[facility] Clinic Note   PCP:  [name] Catalina, PA-C HF Cardiologist:  Dr. Shirlee Latch  HPI:  Richard [name] is a 61 y.o. male who has a history of chronic systolic HF s/p Medtronic ICD (12/2016), HTN, DM, and HLD.    Admitted 5/8-5/14/19 with A/C systolic HF. Echo showed EF 15-20%. Advanced HF team was consulted. He diuresed 20 lbs with IV Lasix, then transitioned to torsemide 40 mg daily. Underwent R/LHC showing a focal severe RCA stenosis treated with DES.  He did not, however, appear to have severe enough coronary disease to explain his cardiomyopathy. Cardiac index was low at 1.9.  HF meds were optimized. Beta blocker was not started due to low CI and soft BPs. He was discharged on home oxygen. Referred to cardiac rehab. DC weight: 260 lbs.    Echo in 07/2018 showed EF 20-25% with severe LV dilation, moderate RV enlargement with moderately decreased RV systolic function.  CPX in 07/2019 showed a severe functional limitation but it actually appeared to be primarily due to pulmonary issues.    Echo in 06/2020 showed EF < 20% with severe LV dilation, normal RV size with moderately decreased systolic function, dilated IVC.    He was admitted in 09/2020 with CHF.    TEE in 12/2020 showed EF 20% with severe LV dilation, moderate RV enlargement with moderately decreased RV systolic function.    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   Returned to [facility] for follow up 02/16/22. Weight was up 2 lbs.  Walking with his prosthetics, no dyspnea walking on flat ground. No problem mowing grass.  Noted dyspnea with heavy lifting. No chest pain, no lightheadedness, no orthopnea/PND. Not using oxygen.   Today he returns to HF clinic for pharmacist medication titration. At last visit with MD spironolactone was increased to 25 mg daily and bisoprolol was increased to 5 mg daily. Overall he is feeling poorly today. States he has become significantly more  short of breath since making the medication changes last visit. Also complains of having to take too many medications as they make him sick. He states he took his evening pills before his visit today instead of his morning pills today because "there are less of them". Has HF paramedicine ([name]) to help him with his pillbox but he repeatidly refuses to take his midday dose of hydralazine and gabapentin and has missed doses on other days. We had a long discussion on the importance of compliance today. Patient also complains of significant dizziness and having trouble seeing, which he thinks may be related to his blood sugar. He doesn't think the Humulin U-500 is controlling his BG well enough. Encouraged patient to follow up with his endocrinologist, Dr. [name], and keep a log of his BG. He has a visit with Dr. [name] in 2 weeks. Weight has been stable at 238 lbs. No LEE, PND or orthopnea.    HF Medications: Bisoprolol 5 mg daily Entresto 24/26 mg BID Empagliflozin 25 mg daily Spironolactone 25 mg daily Hydralazine 75 mg TID Imdur 15 mg daily Digoxin 0.125 mg daily Torsemide 40 mg daily  Has the patient been experiencing any side effects to the medications prescribed?  no  Does the patient have any problems obtaining medications due to transportation or finances?   Has Medicare/Medicaid.   Understanding of regimen: poor Understanding of indications: poor Potential of compliance: poor - not taking medications as prescribed despite significant help from  HF paramedicine program.  Patient understands to avoid NSAIDs. Patient understands to avoid decongestants.    Pertinent Lab Values: 03/06/22: Serum creatinine 1.76, BUN 39, Potassium 4.3, Sodium 138  Vital Signs: Weight: 238.2 lbs (last clinic weight: 238.4 lbs) Blood pressure: 132/86 - took evening medications, not morning medications  Heart rate: 74   Assessment/Plan: 1. Chronic systolic CHF:  Primarily nonischemic cardiomyopathy.  Echo in  2017 with EF 15-20%.  Medtronic ICD. Echo 10/2017 EF 15-20%, severely dilated RV with severely decreased systolic function.  LHC/RHC 11/06/17 showed volume overload with 80% RCA stenosis.  Cardiac index low at 1.91. The degree of coronary disease does not explain his cardiomyopathy.  Echo in 07/2018 showed severe LV dilation, EF 20-25%, moderately decreased RV systolic function.  PYP scan in 12/2019 not suggestive of transthyretin amyloidosis. Echo in 06/2020 showed EF < 20%, moderately decreased RV systolic function.  TEE in 12/2020 with EF 20% with severe LV dilation, moderate RV enlargement with moderately decreased RV systolic function.  - NYHA class II symptoms, he is not volume overloaded by exam.  - Continue torsemide 40 mg daily.  - Decrease bisoprolol back to 2.5 mg daily given increased SOB and dizziness.  - Continue Entresto 24-26 mg BID. - Continue spironolactone to 25 mg daily - Continue empaglifozin 25 mg daily.  - Continue Imdur 15 mg daily and hydralazine 75 mg TID. Needs to start taking midday dose of hydralazine.  - Continue digoxin 0.125 mg daily - Continue Paramedicine.  - Repeat echo scheduled 05/29/22 - Poor insight into disease and multiple medication errors/noncompliance despite help from HF paramedicine. We had long discussion about the importance of medication compliance today.  2. COPD: Never smoked, but apparently had significant occupational exposure.  It appears that he won a lawsuit dealing with the occupational exposure-related COPD. CPX in 07/2019 showed severe functional limitation, but it appeared to be due to pulmonary abnormalities rather than HF. He was on home oxygen in the past but is not using it now.   - Followup with pulmonary.  3. CAD:  LHC in 10/2017 with 80-90% proximal RCA stenosis treated with DES to RCA.  This did not cause his cardiomyopathy but is a large RCA. No chest pain.  - Continue ASA 81 mg daily.  - Continue atorvastatin, LDL 42 (08/2021).  4.  Polycythemia: Likely related to chronic hypoxia.  5. OSA: Cannot tolerate CPAP, wears oxygen at night.  6. DM: He is on empagliflozin.   7. Hypertriglyceridemia: Off Vascepa due to blood in stool that resolved.  - Continue fenofibrate 145 mg daily. TGs well controlled on recent LP, 68  8. PVCs: 3 day Zio (09/2021) 1.3% PVC burden. If burden increases, consider AAD for suppression (would opt for mexiletine vs amiodarone due to COPD). 9. ED: Has prescription for sildenafil. Instructed NOT to take Imdur if planning on using sildenafil.     Follow up 1 months with APP Clinic.   Karle Plumber, PharmD, BCPS, BCCP, CPP Heart Failure Clinic Pharmacist [phone]

## 2022-02-22 ENCOUNTER — Other Ambulatory Visit (HOSPITAL_COMMUNITY): Payer: Self-pay | Admitting: Emergency Medicine

## 2022-02-22 NOTE — Progress Notes (Signed)
Paramedicine Encounter    Patient ID: Richard [name], male    DOB: 03-Apr-1961, 61 y.o.   MRN: [id number]   There were no vitals taken for this visit. Weight yesterday-not taken Last visit weight-238lb CBG 200  ATF  Richard [name] A&O x 4, skin W&D 2/ good color.  Pt still missing over half of his noon doses of Gabapentin and Hydralazine.  Med box reconciled.  No refills needed at this time.  Pt denies chest pain or SOB.  Lung sounds clear and equal bilat.  Reviewed up coming appointments.  Home visit complete.    [name], [place] 02/22/2022   Patient Care Team: Rip Harbour as PCP - General (Internal Medicine) Larey Dresser, MD as PCP - Advanced Heart Failure (Cardiology) Deboraha Sprang, MD as PCP - Electrophysiology (Cardiology) Jorge Ny, LCSW as Social Worker (Licensed Clinical Social Worker)  Patient Active Problem List   Diagnosis Date Noted   Hyperlipidemia 09/09/2021   Wound dehiscence 03/02/2021   Subacute osteomyelitis, right ankle and foot (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[place])    SOB (shortness of breath)    Open wound    Bacteremia    Chronic ulcer of left ankle (HCC)    Severe sepsis without septic shock (HCC)    COPD exacerbation ([place]) [id number]   Chronic systolic CHF (congestive heart failure) ([place]) 01/15/2021   Acute on chronic respiratory failure with hypoxia and hypercapnia ([place]) 01/15/2021   Acute renal failure superimposed on stage 3 chronic kidney disease ([place]) 01/15/2021   Elevated troponin 01/15/2021   Hyponatremia 01/15/2021   CHF exacerbation ([place]) 10/17/2020   Acute on chronic systolic  CHF (congestive heart failure) ([place]) 10/16/2020   Acute on chronic combined systolic and diastolic CHF (congestive heart failure) ([place]) 03/15/2018   S/P right and left heart catheterization 11/02/2017   Panic attacks 11/02/2017   Non-STEMI (non-ST elevated myocardial infarction) ([place])    COPD (chronic obstructive pulmonary disease) ([place])    Hypertension    CKD (chronic kidney disease)    Single hamartoma of lung ([place])    Presence of implantable cardioverter-defibrillator (ICD)    GERD (gastroesophageal reflux disease)    Diabetes mellitus with diabetic neuropathy, with long-term current use of insulin (HCC)     Current Outpatient Medications:    albuterol (VENTOLIN HFA) 108 (90 Base) MCG/ACT inhaler, Inhale 1-2 puffs into the lungs every 4 (four) hours as needed for wheezing or shortness of breath., Disp: 8 g, Rfl: 1   allopurinol (ZYLOPRIM) 100 MG tablet, Take 1 tablet (100 mg total) by mouth daily., Disp: 30 tablet, Rfl: 3   aspirin 81 MG chewable tablet, Chew 81 mg by mouth daily., Disp: , Rfl:    atorvastatin (LIPITOR) 40 MG tablet, TAKE ONE TABLET BY MOUTH DAILY, Disp: 90 tablet, Rfl: 3   bisoprolol (ZEBETA) 5 MG tablet, Take 1 tablet (5 mg total) by mouth daily., Disp: 90 tablet, Rfl: 3   clopidogrel (PLAVIX) 75 MG tablet, TAKE ONE TABLET BY MOUTH DAILY, Disp: 30 tablet, Rfl: 11   digoxin (LANOXIN) 0.125 MG tablet, Take 1 tablet (0.125 mg total)  by mouth daily., Disp: 60 tablet, Rfl: 3   docusate sodium (COLACE) 100 MG capsule, Take 1 capsule (100 mg total) by mouth daily., Disp: 10 capsule, Rfl: 0   DULoxetine (CYMBALTA) 60 MG capsule, Take 1 capsule (60 mg total) by mouth daily., Disp: 30 capsule, Rfl: 2   empagliflozin (JARDIANCE) 25 MG TABS tablet, Take 25 mg by mouth daily., Disp: , Rfl:    fenofibrate (TRICOR) 145 MG tablet, Take 145 mg by mouth daily., Disp: , Rfl:    Fluticasone Furoate-Vilanterol (BREO ELLIPTA IN), Inhale into the lungs daily as needed., Disp: , Rfl:     gabapentin (NEURONTIN) 400 MG capsule, Take 400 mg by mouth 3 (three) times daily., Disp: , Rfl:    hydrALAZINE (APRESOLINE) 50 MG tablet, Take 1.5 tablets (75 mg total) by mouth 3 (three) times daily., Disp: 540 tablet, Rfl: 2   isosorbide mononitrate (IMDUR) 30 MG 24 hr tablet, Take 0.5 tablets (15 mg total) by mouth daily. Do not take if planning to use Viagra, Disp: 30 tablet, Rfl: 11   Magnesium Oxide 200 MG TABS, Take 2 tablets (400 mg total) by mouth daily., Disp: 60 tablet, Rfl: 11   Multiple Vitamin (MULTIVITAMIN WITH MINERALS) TABS tablet, Take 1 tablet by mouth daily. (Patient not taking: Reported on 01/18/2022), Disp: , Rfl:    sacubitril-valsartan (ENTRESTO) 24-26 MG, Take 1 tablet by mouth 2 (two) times daily., Disp: 60 tablet, Rfl: 6   sildenafil (VIAGRA) 50 MG tablet, Take 1 tablet (50 mg total) by mouth daily as needed for erectile dysfunction., Disp: 10 tablet, Rfl: 0   spironolactone (ALDACTONE) 25 MG tablet, Take 1 tablet (25 mg total) by mouth daily., Disp: 90 tablet, Rfl: 3   torsemide (DEMADEX) 20 MG tablet, Take 2 tablets (40 mg total) by mouth daily., Disp: 30 tablet, Rfl: 6   zinc sulfate 220 (50 Zn) MG capsule, Take 1 capsule (220 mg total) by mouth daily., Disp: 30 capsule, Rfl: 0 Allergies  Allergen Reactions   Aspirin Itching   Codeine Hives   Coconut (Cocos Nucifera) Itching      Social History   Socioeconomic History   Marital status: Divorced    Spouse name: Not on file   Number of children: Not on file   Years of education: Not on file   Highest education level: Not on file  Occupational History   Occupation: disability    Comment: stopped working in 2000 d/t occupational exposures  Tobacco Use   Smoking status: Former    Types: Cigarettes   Smokeless tobacco: Never   Tobacco comments:    "smoked when I drank"  Vaping Use   Vaping Use: Never used  Substance and Sexual Activity   Alcohol use: Not Currently    Comment: used to drink multiple  cases of beer daily, quit 2018   Drug use: Never   Sexual activity: Not Currently  Other Topics Concern   Not on file  Social History Narrative   Patient lives at home with wife and some of his 10 children. He self-administers his own medications.   Social Determinants of Health   Financial Resource Strain: Low Risk  (09/22/2021)   Overall Financial Resource Strain (CARDIA)    Difficulty of Paying Living Expenses: Not hard at all  Food Insecurity: No Food Insecurity (09/22/2021)   Hunger Vital Sign    Worried About Running Out of Food in the Last Year: Never true    Ran Out of Food in the Last  Year: Never true  Transportation Needs: Unmet Transportation Needs (09/22/2021)   PRAPARE - Administrator, Civil Service (Medical): Yes    Lack of Transportation (Non-Medical): Yes  Physical Activity: Not on file  Stress: Not on file  Social Connections: Not on file  Intimate Partner Violence: Not on file    Physical Exam      Future Appointments  Date Time Provider Department Center  02/28/2022  7:20 AM [facility] DEVICE REMOTES [facility]  03/06/2022 10:00 AM [facility] LAB [facility] None  03/08/2022 10:00 AM [facility] PHARMACY [facility] None  04/07/2022 11:00 AM [facility] PA/NP [facility] None  05/29/2022 10:10 AM [facility] ECHO/[facility] OP [facility]  05/29/2022 11:40 AM Laurey Morale, MD [facility] None  05/30/2022  7:20 AM [facility] DEVICE REMOTES [facility]  08/29/2022  7:20 AM [facility] DEVICE REMOTES [facility]  11/28/2022  7:20 AM [facility] DEVICE REMOTES [facility]  02/27/2023  7:20 AM [facility] DEVICE REMOTES [facility]       Beatrix Shipper, EMT-Paramedic [phone] [facility]  02/22/22

## 2022-02-28 ENCOUNTER — Ambulatory Visit (INDEPENDENT_AMBULATORY_CARE_PROVIDER_SITE_OTHER): Payer: Medicare Other

## 2022-02-28 DIAGNOSIS — I5022 Chronic systolic (congestive) heart failure: Secondary | ICD-10-CM | POA: Diagnosis not present

## 2022-03-01 ENCOUNTER — Other Ambulatory Visit (HOSPITAL_COMMUNITY): Payer: Self-pay | Admitting: Family Medicine

## 2022-03-01 ENCOUNTER — Other Ambulatory Visit (HOSPITAL_COMMUNITY): Payer: Self-pay | Admitting: Emergency Medicine

## 2022-03-01 NOTE — Progress Notes (Signed)
Paramedicine Encounter    Patient ID: Richard [name], male    DOB: 11/21/1960, 61 y.o.   MRN: [id number]   BP (!) 130/100 (BP Location: Left Arm, Patient Position: Sitting, Cuff Size: Normal)   Pulse 62   Resp 16   Wt 238 lb 6.4 oz (108.1 kg)   SpO2 99%   BMI 28.27 kg/m  Weight yesterday-not taken Last visit weight-239lb  ATF Richard [name] A&O x 4, skin W&D w/ good color.  Pt reports to be feeling well.  He is still missing his noon dose of Hydralazine.  Meds reconciled w/o incident.  Per Mr. Rebar called [facility] to put a hold on auto refills.  Also, Mr. Pigeon requests a refill on his Sidenafil.  Reviewed with him to hold his Isosorbide when he takes the Sidneafil.  Pt denies chest pain or SOB.  Lung sounds clear and equal bilat.  No edema noted and no obvious JVD.   Pt report that he is still looking for a room or apartment to move into as he is unhappy with his current living situation.  Reminded Mr Maxim of his lab appointment on the 11th and pharmacy visit on the 13th and he acknowledges same.  Home visit complete.    Patient Care Team: Rip Harbour as PCP - General (Internal Medicine) Larey Dresser, MD as PCP - Advanced Heart Failure (Cardiology) Deboraha Sprang, MD as PCP - Electrophysiology (Cardiology) Jorge Ny, LCSW as Social Worker (Licensed Clinical Social Worker)  Patient Active Problem List   Diagnosis Date Noted   Hyperlipidemia 09/09/2021   Wound dehiscence 03/02/2021   Subacute osteomyelitis, right ankle and foot ([place])    Sleep disturbance    Essential hypertension    Diabetic peripheral neuropathy ([place])    Labile blood glucose    Left below-knee amputee ([place]) 02/07/2021   PAD (peripheral artery disease) ([place]) 02/01/2021   Below-knee amputation of left lower extremity ([place]) 01/27/2021   S/P BKA (below knee amputation), left (HCC)    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[place])    SOB (shortness of breath)    Open wound    Bacteremia     Chronic ulcer of left ankle ([place])    Severe sepsis without septic shock (HCC)    COPD exacerbation ([place]) Q000111Q   Chronic systolic CHF (congestive heart failure) ([place]) 01/15/2021   Acute on chronic respiratory failure with hypoxia and hypercapnia ([place]) 01/15/2021   Acute renal failure superimposed on stage 3 chronic kidney disease ([place]) 01/15/2021   Elevated troponin 01/15/2021   Hyponatremia 01/15/2021   CHF exacerbation ([place]) 10/17/2020   Acute on chronic systolic CHF (congestive heart failure) ([place]) 10/16/2020   Acute on chronic combined systolic and diastolic CHF (congestive heart failure) ([place]) 03/15/2018   S/P right and left heart catheterization 11/02/2017   Panic attacks 11/02/2017   Non-STEMI (non-ST elevated myocardial infarction) ([place])    COPD (chronic obstructive pulmonary disease) ([place])    Hypertension    CKD (chronic kidney disease)    Single hamartoma of lung ([place])    Presence of implantable cardioverter-defibrillator (ICD)    GERD (gastroesophageal reflux disease)    Diabetes mellitus with diabetic neuropathy, with long-term current use of insulin ([place])     Current Outpatient Medications:    allopurinol (ZYLOPRIM) 100 MG tablet, Take 1 tablet (100 mg total) by mouth daily., Disp: 30 tablet, Rfl: 3   atorvastatin (LIPITOR) 40 MG tablet, TAKE ONE TABLET BY MOUTH DAILY,  Disp: 90 tablet, Rfl: 3   bisoprolol (ZEBETA) 5 MG tablet, Take 1 tablet (5 mg total) by mouth daily., Disp: 90 tablet, Rfl: 3   clopidogrel (PLAVIX) 75 MG tablet, TAKE ONE TABLET BY MOUTH DAILY, Disp: 30 tablet, Rfl: 11   digoxin (LANOXIN) 0.125 MG tablet, Take 1 tablet (0.125 mg total) by mouth daily., Disp: 60 tablet, Rfl: 3   DULoxetine (CYMBALTA) 60 MG capsule, Take 1 capsule (60 mg total) by mouth daily., Disp: 30 capsule, Rfl: 2   empagliflozin (JARDIANCE) 25 MG TABS tablet, Take 25 mg by mouth daily., Disp: , Rfl:    fenofibrate (TRICOR) 145 MG tablet, Take 145 mg by mouth daily., Disp: , Rfl:     hydrALAZINE (APRESOLINE) 50 MG tablet, Take 1.5 tablets (75 mg total) by mouth 3 (three) times daily., Disp: 540 tablet, Rfl: 2   isosorbide mononitrate (IMDUR) 30 MG 24 hr tablet, Take 0.5 tablets (15 mg total) by mouth daily. Do not take if planning to use Viagra, Disp: 30 tablet, Rfl: 11   Magnesium Oxide 200 MG TABS, Take 2 tablets (400 mg total) by mouth daily., Disp: 60 tablet, Rfl: 11   sacubitril-valsartan (ENTRESTO) 24-26 MG, Take 1 tablet by mouth 2 (two) times daily., Disp: 60 tablet, Rfl: 6   spironolactone (ALDACTONE) 25 MG tablet, Take 1 tablet (25 mg total) by mouth daily., Disp: 90 tablet, Rfl: 3   torsemide (DEMADEX) 20 MG tablet, Take 2 tablets (40 mg total) by mouth daily., Disp: 30 tablet, Rfl: 6   zinc sulfate 220 (50 Zn) MG capsule, Take 1 capsule (220 mg total) by mouth daily., Disp: 30 capsule, Rfl: 0   albuterol (VENTOLIN HFA) 108 (90 Base) MCG/ACT inhaler, Inhale 1-2 puffs into the lungs every 4 (four) hours as needed for wheezing or shortness of breath. (Patient not taking: Reported on 02/22/2022), Disp: 8 g, Rfl: 1   aspirin 81 MG chewable tablet, Chew 81 mg by mouth daily., Disp: , Rfl:    docusate sodium (COLACE) 100 MG capsule, Take 1 capsule (100 mg total) by mouth daily. (Patient not taking: Reported on 02/22/2022), Disp: 10 capsule, Rfl: 0   Fluticasone Furoate-Vilanterol (BREO ELLIPTA IN), Inhale into the lungs daily as needed. (Patient not taking: Reported on 02/22/2022), Disp: , Rfl:    gabapentin (NEURONTIN) 400 MG capsule, Take 400 mg by mouth 3 (three) times daily., Disp: , Rfl:    Multiple Vitamin (MULTIVITAMIN WITH MINERALS) TABS tablet, Take 1 tablet by mouth daily. (Patient not taking: Reported on 03/01/2022), Disp: , Rfl:    sildenafil (VIAGRA) 50 MG tablet, TAKE 1 TABLET (50 MG TOTAL) BY MOUTH DAILY AS NEEDED FOR ERECTILE DYSFUNCTION., Disp: 10 tablet, Rfl: 0 Allergies  Allergen Reactions   Aspirin Itching   Codeine Hives   Coconut (Cocos Nucifera)  Itching      Social History   Socioeconomic History   Marital status: Divorced    Spouse name: Not on file   Number of children: Not on file   Years of education: Not on file   Highest education level: Not on file  Occupational History   Occupation: disability    Comment: stopped working in 2000 d/t occupational exposures  Tobacco Use   Smoking status: Former    Types: Cigarettes   Smokeless tobacco: Never   Tobacco comments:    "smoked when I drank"  Vaping Use   Vaping Use: Never used  Substance and Sexual Activity   Alcohol use: Not Currently  Comment: 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  Other Topics Concern   Not on file  Social History Narrative   Patient lives at home with wife and some of his 10 children. He self-administers his own medications.   Social Determinants of Health   Financial Resource Strain: Low Risk  (09/22/2021)   Overall Financial Resource Strain (CARDIA)    Difficulty of Paying Living Expenses: Not hard at all  Food Insecurity: No Food Insecurity (09/22/2021)   Hunger Vital Sign    Worried About Running Out of Food in the Last Year: Never true    Ran Out of Food in the Last Year: Never true  Transportation Needs: Unmet Transportation Needs (09/22/2021)   PRAPARE - Administrator, Civil Service (Medical): Yes    Lack of Transportation (Non-Medical): Yes  Physical Activity: Not on file  Stress: Not on file  Social Connections: Not on file  Intimate Partner Violence: Not on file    Physical Exam      Future Appointments  Date Time Provider Department Center  03/06/2022 10:00 AM [facility] LAB [facility] None  03/08/2022 10:00 AM [facility] PHARMACY [facility] None  04/07/2022 11:00 AM [facility] PA/NP [facility] None  05/29/2022 10:10 AM MC ECHO/CH OP [facility]  05/29/2022 11:40 AM Laurey Morale, MD [facility] None  05/30/2022  7:20 AM [facility] DEVICE REMOTES  [facility]  08/29/2022  7:20 AM [facility] DEVICE REMOTES [facility]  11/28/2022  7:20 AM [facility] DEVICE REMOTES [facility]  02/27/2023  7:20 AM [facility] DEVICE REMOTES [facility]       Beatrix Shipper, EMT-Paramedic [phone] [facility]  03/01/22

## 2022-03-03 ENCOUNTER — Other Ambulatory Visit: Payer: Self-pay | Admitting: Vascular Surgery

## 2022-03-03 DIAGNOSIS — I739 Peripheral vascular disease, unspecified: Secondary | ICD-10-CM

## 2022-03-03 LAB — CUP PACEART REMOTE DEVICE CHECK
Battery Remaining Longevity: 52 mo
Battery Voltage: 2.97 V
Brady Statistic AP VP Percent: 0.01 %
Brady Statistic AP VS Percent: 3.44 %
Brady Statistic AS VP Percent: 0.08 %
Brady Statistic AS VS Percent: 96.48 %
Brady Statistic RA Percent Paced: 3.44 %
Brady Statistic RV Percent Paced: 0.08 %
Date Time Interrogation Session: 20230908000109
HighPow Impedance: 89 Ohm
Implantable Lead Implant Date: 20180719
Implantable Lead Implant Date: 20180719
Implantable Lead Location: 753859
Implantable Lead Location: 753860
Implantable Lead Model: 5076
Implantable Pulse Generator Implant Date: 20180719
Lead Channel Impedance Value: 342 Ohm
Lead Channel Impedance Value: 418 Ohm
Lead Channel Impedance Value: 418 Ohm
Lead Channel Pacing Threshold Amplitude: 0.875 V
Lead Channel Pacing Threshold Amplitude: 0.875 V
Lead Channel Pacing Threshold Pulse Width: 0.4 ms
Lead Channel Pacing Threshold Pulse Width: 0.4 ms
Lead Channel Sensing Intrinsic Amplitude: 4.375 mV
Lead Channel Sensing Intrinsic Amplitude: 4.375 mV
Lead Channel Sensing Intrinsic Amplitude: 6.25 mV
Lead Channel Sensing Intrinsic Amplitude: 6.25 mV
Lead Channel Setting Pacing Amplitude: 1.75 V
Lead Channel Setting Pacing Amplitude: 2 V
Lead Channel Setting Pacing Pulse Width: 0.4 ms
Lead Channel Setting Sensing Sensitivity: 0.3 mV

## 2022-03-06 ENCOUNTER — Ambulatory Visit (HOSPITAL_COMMUNITY)
Admission: RE | Admit: 2022-03-06 | Discharge: 2022-03-06 | Disposition: A | Discharge status: Home / Self Care | Payer: Medicare Other | Source: Ambulatory Visit | Attending: Cardiology | Admitting: Cardiology

## 2022-03-06 DIAGNOSIS — I5022 Chronic systolic (congestive) heart failure: Secondary | ICD-10-CM

## 2022-03-06 LAB — BASIC METABOLIC PANEL
Anion gap: 9 (ref 5–15)
BUN: 39 mg/dL — ABNORMAL HIGH (ref 6–20)
CO2: 31 mmol/L (ref 22–32)
Calcium: 9.6 mg/dL (ref 8.9–10.3)
Chloride: 98 mmol/L (ref 98–111)
Creatinine, Ser: 1.76 mg/dL — ABNORMAL HIGH (ref 0.61–1.24)
GFR, Estimated: 44 mL/min — ABNORMAL LOW (ref >60)
Glucose, Bld: 201 mg/dL — ABNORMAL HIGH (ref 70–99)
Potassium: 4.3 mmol/L (ref 3.5–5.1)
Sodium: 138 mmol/L (ref 135–145)

## 2022-03-08 ENCOUNTER — Other Ambulatory Visit (HOSPITAL_COMMUNITY): Payer: Self-pay | Admitting: Emergency Medicine

## 2022-03-08 ENCOUNTER — Ambulatory Visit (HOSPITAL_COMMUNITY)
Admission: RE | Admit: 2022-03-08 | Discharge: 2022-03-08 | Disposition: A | Discharge status: Home / Self Care | Payer: Medicare Other | Source: Ambulatory Visit | Attending: Cardiology | Admitting: Cardiology

## 2022-03-08 VITALS — BP 132/86 | HR 74 | Wt 238.2 lb

## 2022-03-08 DIAGNOSIS — I5022 Chronic systolic (congestive) heart failure: Secondary | ICD-10-CM | POA: Insufficient documentation

## 2022-03-08 DIAGNOSIS — Z9981 Dependence on supplemental oxygen: Secondary | ICD-10-CM | POA: Insufficient documentation

## 2022-03-08 DIAGNOSIS — E119 Type 2 diabetes mellitus without complications: Secondary | ICD-10-CM | POA: Diagnosis not present

## 2022-03-08 DIAGNOSIS — E785 Hyperlipidemia, unspecified: Secondary | ICD-10-CM | POA: Diagnosis not present

## 2022-03-08 DIAGNOSIS — I110 Hypertensive heart disease with heart failure: Secondary | ICD-10-CM | POA: Insufficient documentation

## 2022-03-08 MED ORDER — BISOPROLOL FUMARATE 5 MG PO TABS: 2.5000 mg | ORAL_TABLET | Freq: Every day | ORAL | 3 refills | Status: DC

## 2022-03-08 NOTE — Patient Instructions (Addendum)
It was a pleasure seeing you today!  MEDICATIONS: -We are changing your medications today -Decrease bisoprolol to 2.5 mg (1/2 tablet) daily -Call if you have questions about your medications.   NEXT APPOINTMENT: Return to clinic in 1 month with [facility].  In general, to take care of your heart failure: -Limit your fluid intake to 2 Liters (half-gallon) per day.   -Limit your salt intake to ideally 2-3 grams (2000-3000 mg) per day. -Weigh yourself daily and record, and bring that "weight diary" to your next appointment.  (Weight gain of 2-3 pounds in 1 day typically means fluid weight.) -The medications for your heart are to help your heart and help you live longer.   -Please contact us before stopping any of your heart medications.  Call the clinic at [phone] with questions or to reschedule future appointments.

## 2022-03-08 NOTE — Progress Notes (Signed)
Paramedicine Encounter    Patient ID: Richard [name], male    DOB: Aug 22, 1960, 61 y.o.   MRN: [id number]   There were no vitals taken for this visit. Weight yesterday-not taken Last visit weight-238lb Pharmacy visit with Mr. [name].  He tells [name] today that he hasn't been feeling his best and complains of some mild dizziness.  He has not taken his sugar this morning but does state he at a steak for breakfast about and hour before he came to clinic.  He was A&O x 4, skin W&D w/ good color.  He says the thinks its his medicine that is making him feel bad. No complaints of chest pain or SOB.   [name] advises that she is going to decrease his Bisoprolol back down to 2.5mg . This is the only med change during the visit.  Med box reconciled.  Discussed with Mr [name] again about the importance of med compliance so that his improvement or decline can be more accurately measured.   Also discussed during this visit was that he needs to reach out to Dr. [name], his Endocrinologist.  He needs a more current DexCom device and sensors and also needs refills for his testing supplies.  Visit complete.   Patient Care Team: Olevia Perches as PCP - General (Internal Medicine) Laurey Morale, MD as PCP - Advanced Heart Failure (Cardiology) Duke Salvia, MD as PCP - Electrophysiology (Cardiology) Burna Sis, LCSW as Social Worker (Licensed Clinical Social Worker)  Patient Active Problem List   Diagnosis Date Noted   Hyperlipidemia 09/09/2021   Wound dehiscence 03/02/2021   Subacute osteomyelitis, right ankle and foot (HCC)    Sleep disturbance    Essential hypertension    Diabetic peripheral neuropathy (HCC)    Labile blood glucose    Left below-knee amputee (HCC) 02/07/2021   PAD (peripheral artery disease) (HCC) 02/01/2021   Below-knee amputation of left lower extremity (HCC) 01/27/2021   S/P BKA (below knee amputation), left (HCC)    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HCC)    SOB  (shortness of breath)    Open wound    Bacteremia    Chronic ulcer of left ankle (HCC)    Severe sepsis without septic shock (HCC)    COPD exacerbation (HCC) 01/15/2021   Chronic systolic CHF (congestive heart failure) (HCC) 01/15/2021   Acute on chronic respiratory failure with hypoxia and hypercapnia (HCC) 01/15/2021   Acute renal failure superimposed on stage 3 chronic kidney disease (HCC) 01/15/2021   Elevated troponin 01/15/2021   Hyponatremia 01/15/2021   CHF exacerbation (HCC) 10/17/2020   Acute on chronic systolic CHF (congestive heart failure) (HCC) 10/16/2020   Acute on chronic combined systolic and diastolic CHF (congestive heart failure) (HCC) 03/15/2018   S/P right and left heart catheterization 11/02/2017   Panic attacks 11/02/2017   Non-STEMI (non-ST elevated myocardial infarction) (HCC)    COPD (chronic obstructive pulmonary disease) (HCC)    Hypertension    CKD (chronic kidney disease)    Single hamartoma of lung (HCC)    Presence of implantable cardioverter-defibrillator (ICD)    GERD (gastroesophageal reflux disease)    Diabetes mellitus with diabetic neuropathy, with long-term current use of insulin (HCC)     Current Outpatient Medications:    albuterol (VENTOLIN HFA) 108 (90 Base) MCG/ACT inhaler, Inhale 1-2 puffs into the lungs every 4 (four) hours as needed for wheezing or shortness of breath., Disp: 8 g, Rfl: 1   allopurinol (ZYLOPRIM) 100 MG  tablet, Take 1 tablet (100 mg total) by mouth daily., Disp: 30 tablet, Rfl: 3   aspirin 81 MG chewable tablet, Chew 81 mg by mouth daily., Disp: , Rfl:    atorvastatin (LIPITOR) 40 MG tablet, TAKE ONE TABLET BY MOUTH DAILY, Disp: 90 tablet, Rfl: 3   bisoprolol (ZEBETA) 5 MG tablet, Take 0.5 tablets (2.5 mg total) by mouth daily., Disp: 45 tablet, Rfl: 3   clopidogrel (PLAVIX) 75 MG tablet, TAKE ONE TABLET BY MOUTH DAILY, Disp: 30 tablet, Rfl: 11   digoxin (LANOXIN) 0.125 MG tablet, Take 1 tablet (0.125 mg total) by mouth  daily., Disp: 60 tablet, Rfl: 3   docusate sodium (COLACE) 100 MG capsule, Take 1 capsule (100 mg total) by mouth daily., Disp: 10 capsule, Rfl: 0   DULoxetine (CYMBALTA) 60 MG capsule, Take 1 capsule (60 mg total) by mouth daily., Disp: 30 capsule, Rfl: 2   empagliflozin (JARDIANCE) 25 MG TABS tablet, Take 25 mg by mouth daily., Disp: , Rfl:    fenofibrate (TRICOR) 145 MG tablet, Take 145 mg by mouth daily., Disp: , Rfl:    Fluticasone Furoate-Vilanterol (BREO ELLIPTA IN), Inhale into the lungs daily as needed. (Patient not taking: Reported on 02/22/2022), Disp: , Rfl:    gabapentin (NEURONTIN) 400 MG capsule, Take 400 mg by mouth 3 (three) times daily., Disp: , Rfl:    HUMULIN R U-500 KWIKPEN 500 UNIT/ML KwikPen, Inject into the skin., Disp: , Rfl:    hydrALAZINE (APRESOLINE) 50 MG tablet, Take 1.5 tablets (75 mg total) by mouth 3 (three) times daily., Disp: 540 tablet, Rfl: 2   isosorbide mononitrate (IMDUR) 30 MG 24 hr tablet, Take 0.5 tablets (15 mg total) by mouth daily. Do not take if planning to use Viagra, Disp: 30 tablet, Rfl: 11   Magnesium Oxide 200 MG TABS, Take 2 tablets (400 mg total) by mouth daily., Disp: 60 tablet, Rfl: 11   Multiple Vitamin (MULTIVITAMIN WITH MINERALS) TABS tablet, Take 1 tablet by mouth daily., Disp: , Rfl:    sacubitril-valsartan (ENTRESTO) 24-26 MG, Take 1 tablet by mouth 2 (two) times daily., Disp: 60 tablet, Rfl: 6   sildenafil (VIAGRA) 50 MG tablet, TAKE 1 TABLET (50 MG TOTAL) BY MOUTH DAILY AS NEEDED FOR ERECTILE DYSFUNCTION., Disp: 10 tablet, Rfl: 0   spironolactone (ALDACTONE) 25 MG tablet, Take 1 tablet (25 mg total) by mouth daily., Disp: 90 tablet, Rfl: 3   torsemide (DEMADEX) 20 MG tablet, Take 2 tablets (40 mg total) by mouth daily., Disp: 30 tablet, Rfl: 6   zinc sulfate 220 (50 Zn) MG capsule, Take 1 capsule (220 mg total) by mouth daily., Disp: 30 capsule, Rfl: 0 Allergies  Allergen Reactions   Aspirin Itching   Codeine Hives   Coconut (Cocos  Nucifera) Itching      Social History   Socioeconomic History   Marital status: Divorced    Spouse name: Not on file   Number of children: Not on file   Years of education: Not on file   Highest education level: Not on file  Occupational History   Occupation: disability    Comment: stopped working in 2000 d/t occupational exposures  Tobacco Use   Smoking status: Former    Types: Cigarettes   Smokeless tobacco: Never   Tobacco comments:    "smoked when I drank"  Vaping Use   Vaping Use: Never used  Substance and Sexual Activity   Alcohol use: Not Currently    Comment: used to drink multiple cases  of beer daily, quit 2018   Drug use: Never   Sexual activity: Not Currently  Other Topics Concern   Not on file  Social History Narrative   Patient lives at home with wife and some of his 10 children. He self-administers his own medications.   Social Determinants of Health   Financial Resource Strain: Low Risk  (09/22/2021)   Overall Financial Resource Strain (CARDIA)    Difficulty of Paying Living Expenses: Not hard at all  Food Insecurity: No Food Insecurity (09/22/2021)   Hunger Vital Sign    Worried About Running Out of Food in the Last Year: Never true    Ran Out of Food in the Last Year: Never true  Transportation Needs: Unmet Transportation Needs (09/22/2021)   PRAPARE - Administrator, Civil Service (Medical): Yes    Lack of Transportation (Non-Medical): Yes  Physical Activity: Not on file  Stress: Not on file  Social Connections: Not on file  Intimate Partner Violence: Not on file    Physical Exam      Future Appointments  Date Time Provider Department Center  04/07/2022 11:00 AM [facility] PA/NP [facility] None  05/29/2022 10:10 AM MC ECHO/[facility] OP [facility]  05/29/2022 11:40 AM Laurey Morale, MD [facility] None  05/30/2022  7:20 AM [facility] DEVICE REMOTES [facility]  08/29/2022  7:20 AM [facility] DEVICE REMOTES [facility]  11/28/2022  7:20 AM [facility] DEVICE REMOTES [facility]  02/27/2023  7:20 AM [facility] DEVICE REMOTES [facility]       Beatrix Shipper, EMT-Paramedic [phone] [facility]  03/08/22

## 2022-03-15 ENCOUNTER — Other Ambulatory Visit (HOSPITAL_COMMUNITY): Payer: Self-pay | Admitting: Emergency Medicine

## 2022-03-15 NOTE — Progress Notes (Signed)
Paramedicine Encounter   Patient ID: Richard [name], male    DOB: September 27, 1960, 61 y.o.   MRN: [id number]   BP 102/80 (BP Location: Right Arm, Patient Position: Sitting, Cuff Size: Normal)   Pulse 68   Resp 16   Wt 239 lb 9.6 oz (108.7 kg)   SpO2 94%   BMI 28.41 kg/m  Weight yesterday-not taken Last visit weight-238lb  ATF Richard [name] A&O x 4, skin warm and dry with good color.  Pt denies chest pain or SOB.  Lung sounds clear and equal bilat and no obvious signs of edema noted.  He is still missing his noon dose of meds.  We again discussed med compliance and he again said he would work on same.  Med box reconciled.  Called in refill on Torsemide.  Cancelled auto refill with [employer].   Reviewed pending appointments with Richard [name].  Home visit complete.    [name], [place] 03/15/2022    Patient Care Team: Rip Harbour as PCP - General (Internal Medicine) Larey Dresser, MD as PCP - Advanced Heart Failure (Cardiology) Deboraha Sprang, MD as PCP - Electrophysiology (Cardiology) Jorge Ny, LCSW as Social Worker (Licensed Clinical Social Worker)  Patient Active Problem List   Diagnosis Date Noted   Hyperlipidemia 09/09/2021   Wound dehiscence 03/02/2021   Subacute osteomyelitis, right ankle and foot ([place])    Sleep disturbance    Essential hypertension    Diabetic peripheral neuropathy ([place])    Labile blood glucose    Left below-knee amputee ([place]) 02/07/2021   PAD (peripheral artery disease) ([place]) 02/01/2021   Below-knee amputation of left lower extremity ([place]) 01/27/2021   S/P BKA (below knee amputation), left (HCC)    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[place])    SOB (shortness of breath)    Open wound    Bacteremia    Chronic ulcer of left ankle (HCC)    Severe sepsis without septic shock (HCC)    COPD exacerbation ([place]) 24/23/5361   Chronic systolic CHF (congestive heart failure) ([place]) 01/15/2021   Acute on chronic respiratory  failure with hypoxia and hypercapnia ([place]) 01/15/2021   Acute renal failure superimposed on stage 3 chronic kidney disease ([place]) 01/15/2021   Elevated troponin 01/15/2021   Hyponatremia 01/15/2021   CHF exacerbation ([place]) 10/17/2020   Acute on chronic systolic CHF (congestive heart failure) ([place]) 10/16/2020   Acute on chronic combined systolic and diastolic CHF (congestive heart failure) ([place]) 03/15/2018   S/P right and left heart catheterization 11/02/2017   Panic attacks 11/02/2017   Non-STEMI (non-ST elevated myocardial infarction) ([place])    COPD (chronic obstructive pulmonary disease) ([place])    Hypertension    CKD (chronic kidney disease)    Single hamartoma of lung ([place])    Presence of implantable cardioverter-defibrillator (ICD)    GERD (gastroesophageal reflux disease)    Diabetes mellitus with diabetic neuropathy, with long-term current use of insulin ([place])     Current Outpatient Medications:    allopurinol (ZYLOPRIM) 100 MG tablet, Take 1 tablet (100 mg total) by mouth daily., Disp: 30 tablet, Rfl: 3   aspirin 81 MG chewable tablet, Chew 81 mg by mouth daily., Disp: , Rfl:    atorvastatin (LIPITOR) 40 MG tablet, TAKE ONE TABLET BY MOUTH DAILY, Disp: 90 tablet, Rfl: 3   bisoprolol (ZEBETA) 5 MG tablet, Take 0.5 tablets (2.5 mg total) by mouth daily., Disp: 45 tablet, Rfl: 3   clopidogrel (PLAVIX) 75 MG  tablet, TAKE ONE TABLET BY MOUTH DAILY, Disp: 30 tablet, Rfl: 11   digoxin (LANOXIN) 0.125 MG tablet, Take 1 tablet (0.125 mg total) by mouth daily., Disp: 60 tablet, Rfl: 3   docusate sodium (COLACE) 100 MG capsule, Take 1 capsule (100 mg total) by mouth daily., Disp: 10 capsule, Rfl: 0   DULoxetine (CYMBALTA) 60 MG capsule, Take 1 capsule (60 mg total) by mouth daily., Disp: 30 capsule, Rfl: 2   empagliflozin (JARDIANCE) 25 MG TABS tablet, Take 25 mg by mouth daily., Disp: , Rfl:    fenofibrate (TRICOR) 145 MG tablet, Take 145 mg by mouth daily., Disp: , Rfl:    gabapentin  (NEURONTIN) 400 MG capsule, Take 400 mg by mouth 3 (three) times daily., Disp: , Rfl:    HUMULIN R U-500 KWIKPEN 500 UNIT/ML KwikPen, Inject into the skin., Disp: , Rfl:    hydrALAZINE (APRESOLINE) 50 MG tablet, Take 1.5 tablets (75 mg total) by mouth 3 (three) times daily., Disp: 540 tablet, Rfl: 2   isosorbide mononitrate (IMDUR) 30 MG 24 hr tablet, Take 0.5 tablets (15 mg total) by mouth daily. Do not take if planning to use Viagra, Disp: 30 tablet, Rfl: 11   Magnesium Oxide 200 MG TABS, Take 2 tablets (400 mg total) by mouth daily., Disp: 60 tablet, Rfl: 11   sacubitril-valsartan (ENTRESTO) 24-26 MG, Take 1 tablet by mouth 2 (two) times daily., Disp: 60 tablet, Rfl: 6   sildenafil (VIAGRA) 50 MG tablet, TAKE 1 TABLET (50 MG TOTAL) BY MOUTH DAILY AS NEEDED FOR ERECTILE DYSFUNCTION., Disp: 10 tablet, Rfl: 0   spironolactone (ALDACTONE) 25 MG tablet, Take 1 tablet (25 mg total) by mouth daily., Disp: 90 tablet, Rfl: 3   torsemide (DEMADEX) 20 MG tablet, Take 2 tablets (40 mg total) by mouth daily., Disp: 30 tablet, Rfl: 6   zinc sulfate 220 (50 Zn) MG capsule, Take 1 capsule (220 mg total) by mouth daily., Disp: 30 capsule, Rfl: 0   albuterol (VENTOLIN HFA) 108 (90 Base) MCG/ACT inhaler, Inhale 1-2 puffs into the lungs every 4 (four) hours as needed for wheezing or shortness of breath. (Patient not taking: Reported on 03/08/2022), Disp: 8 g, Rfl: 1   Fluticasone Furoate-Vilanterol (BREO ELLIPTA IN), Inhale into the lungs daily as needed. (Patient not taking: Reported on 02/22/2022), Disp: , Rfl:    Multiple Vitamin (MULTIVITAMIN WITH MINERALS) TABS tablet, Take 1 tablet by mouth daily. (Patient not taking: Reported on 03/15/2022), Disp: , Rfl:  Allergies  Allergen Reactions   Aspirin Itching   Codeine Hives   Coconut (Cocos Nucifera) Itching      Social History   Socioeconomic History   Marital status: Divorced    Spouse name: Not on file   Number of children: Not on file   Years of  education: Not on file   Highest education level: Not on file  Occupational History   Occupation: disability    Comment: stopped working in 2000 d/t occupational exposures  Tobacco Use   Smoking status: Former    Types: Cigarettes   Smokeless tobacco: Never   Tobacco comments:    "smoked when I drank"  Vaping Use   Vaping Use: Never used  Substance and Sexual Activity   Alcohol use: Not Currently    Comment: 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  Other Topics Concern   Not on file  Social History Narrative   Patient  lives at home with wife and some of his 10 children. He self-administers his own medications.   Social Determinants of Health   Financial Resource Strain: Low Risk  (09/22/2021)   Overall Financial Resource Strain (CARDIA)    Difficulty of Paying Living Expenses: Not hard at all  Food Insecurity: No Food Insecurity (09/22/2021)   Hunger Vital Sign    Worried About Running Out of Food in the Last Year: Never true    Ran Out of Food in the Last Year: Never true  Transportation Needs: Unmet Transportation Needs (09/22/2021)   PRAPARE - Hydrologist (Medical): Yes    Lack of Transportation (Non-Medical): Yes  Physical Activity: Not on file  Stress: Not on file  Social Connections: Not on file  Intimate Partner Violence: Not on file    Physical Exam      Future Appointments  Date Time Provider [place]  04/07/2022 11:00 AM [facility] PA/NP [facility] None  05/29/2022 10:10 AM MC ECHO/CH OP [facility]  05/29/2022 11:40 AM Larey Dresser, MD [facility] None  05/30/2022  7:20 AM [facility] DEVICE REMOTES [facility]  08/29/2022  7:20 AM [facility] DEVICE REMOTES [facility]  11/28/2022  7:20 AM [facility] DEVICE REMOTES [facility]  02/27/2023  7:20 AM [facility] DEVICE REMOTES [facility]       [name],  [place] [facility]  03/15/22

## 2022-03-20 NOTE — Progress Notes (Signed)
Remote ICD transmission.   

## 2022-03-21 ENCOUNTER — Other Ambulatory Visit (HOSPITAL_COMMUNITY): Payer: Self-pay | Admitting: Emergency Medicine

## 2022-03-21 NOTE — Progress Notes (Signed)
Paramedicine Encounter    Patient ID: Richard [name], male    DOB: 22-Jan-1961, 61 y.o.   MRN: [id number]   BP (!) 170/100 (BP Location: Left Arm, Patient Position: Sitting, Cuff Size: Normal)   Pulse 60   Resp 16   Wt 234 lb 3.2 oz (106.2 kg)   SpO2 100%   BMI 27.77 kg/m  Weight yesterday-not taken Last visit weight-239.9lb  Richard [name] A&Ox 4, skin W & D w/ good color.  Pt. Denies chest pain or SOB.  Lung sounds clear and equal bilat and not edema noted.  He advised that his sugar has been running high "since I started taking that medicine." (Referring to Hydralazine).  I did reach out to [name] and got a reply that nothing on his med list would indicate possible issues with his blood glucose.  I advised Richard [name] to speak with his Endocrinologist and perhaps he needs some changes with his diabetic medications.  He advised he would discuss same at his upcoming visit with Dr. [name] on 9/28.   Medications reconciled.  He continues to not take his noon doses of Hydralazine and Gabapentin and has missed several other days of meds also.  Once again I encouraged him to take his medications as prescribed and as laid out in his pill box in order to be able to accurately measure the efficacy of his medication regimen.   This is discussed almost every single visit. Home visit complete.    Beatrix Shipper, EMT-Paramedic [phone] 03/21/2022   Patient Care Team: Olevia Perches as PCP - General (Internal Medicine) Laurey Morale, MD as PCP - Advanced Heart Failure (Cardiology) Duke Salvia, MD as PCP - Electrophysiology (Cardiology) Burna Sis, LCSW as Social Worker (Licensed Clinical Social Worker)  Patient Active Problem List   Diagnosis Date Noted   Hyperlipidemia 09/09/2021   Wound dehiscence 03/02/2021   Subacute osteomyelitis, right ankle and foot (HCC)    Sleep disturbance    Essential hypertension    Diabetic peripheral neuropathy (HCC)    Labile blood glucose    Left  below-knee amputee (HCC) 02/07/2021   PAD (peripheral artery disease) (HCC) 02/01/2021   Below-knee amputation of left lower extremity (HCC) 01/27/2021   S/P BKA (below knee amputation), left (HCC)    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HCC)    SOB (shortness of breath)    Open wound    Bacteremia    Chronic ulcer of left ankle (HCC)    Severe sepsis without septic shock (HCC)    COPD exacerbation (HCC) 01/15/2021   Chronic systolic CHF (congestive heart failure) (HCC) 01/15/2021   Acute on chronic respiratory failure with hypoxia and hypercapnia (HCC) 01/15/2021   Acute renal failure superimposed on stage 3 chronic kidney disease (HCC) 01/15/2021   Elevated troponin 01/15/2021   Hyponatremia 01/15/2021   CHF exacerbation (HCC) 10/17/2020   Acute on chronic systolic CHF (congestive heart failure) (HCC) 10/16/2020   Acute on chronic combined systolic and diastolic CHF (congestive heart failure) (HCC) 03/15/2018   S/P right and left heart catheterization 11/02/2017   Panic attacks 11/02/2017   Non-STEMI (non-ST elevated myocardial infarction) (HCC)    COPD (chronic obstructive pulmonary disease) (HCC)    Hypertension    CKD (chronic kidney disease)    Single hamartoma of lung (HCC)    Presence of implantable cardioverter-defibrillator (ICD)    GERD (gastroesophageal reflux disease)    Diabetes mellitus with diabetic neuropathy, with long-term current use  of insulin ([place])     Current Outpatient Medications:    allopurinol (ZYLOPRIM) 100 MG tablet, Take 1 tablet (100 mg total) by mouth daily., Disp: 30 tablet, Rfl: 3   atorvastatin (LIPITOR) 40 MG tablet, TAKE ONE TABLET BY MOUTH DAILY, Disp: 90 tablet, Rfl: 3   bisoprolol (ZEBETA) 5 MG tablet, Take 0.5 tablets (2.5 mg total) by mouth daily., Disp: 45 tablet, Rfl: 3   clopidogrel (PLAVIX) 75 MG tablet, TAKE ONE TABLET BY MOUTH DAILY, Disp: 30 tablet, Rfl: 11   digoxin (LANOXIN) 0.125 MG tablet, Take 1 tablet (0.125 mg total) by mouth  daily., Disp: 60 tablet, Rfl: 3   docusate sodium (COLACE) 100 MG capsule, Take 1 capsule (100 mg total) by mouth daily., Disp: 10 capsule, Rfl: 0   DULoxetine (CYMBALTA) 60 MG capsule, Take 1 capsule (60 mg total) by mouth daily., Disp: 30 capsule, Rfl: 2   empagliflozin (JARDIANCE) 25 MG TABS tablet, Take 25 mg by mouth daily., Disp: , Rfl:    fenofibrate (TRICOR) 145 MG tablet, Take 145 mg by mouth daily., Disp: , Rfl:    gabapentin (NEURONTIN) 400 MG capsule, Take 400 mg by mouth 3 (three) times daily., Disp: , Rfl:    HUMULIN R U-500 KWIKPEN 500 UNIT/ML KwikPen, Inject into the skin., Disp: , Rfl:    hydrALAZINE (APRESOLINE) 50 MG tablet, Take 1.5 tablets (75 mg total) by mouth 3 (three) times daily., Disp: 540 tablet, Rfl: 2   isosorbide mononitrate (IMDUR) 30 MG 24 hr tablet, Take 0.5 tablets (15 mg total) by mouth daily. Do not take if planning to use Viagra, Disp: 30 tablet, Rfl: 11   Magnesium Oxide 200 MG TABS, Take 2 tablets (400 mg total) by mouth daily., Disp: 60 tablet, Rfl: 11   sacubitril-valsartan (ENTRESTO) 24-26 MG, Take 1 tablet by mouth 2 (two) times daily., Disp: 60 tablet, Rfl: 6   sildenafil (VIAGRA) 50 MG tablet, TAKE 1 TABLET (50 MG TOTAL) BY MOUTH DAILY AS NEEDED FOR ERECTILE DYSFUNCTION., Disp: 10 tablet, Rfl: 0   spironolactone (ALDACTONE) 25 MG tablet, Take 1 tablet (25 mg total) by mouth daily., Disp: 90 tablet, Rfl: 3   torsemide (DEMADEX) 20 MG tablet, Take 2 tablets (40 mg total) by mouth daily., Disp: 30 tablet, Rfl: 6   zinc sulfate 220 (50 Zn) MG capsule, Take 1 capsule (220 mg total) by mouth daily., Disp: 30 capsule, Rfl: 0   albuterol (VENTOLIN HFA) 108 (90 Base) MCG/ACT inhaler, Inhale 1-2 puffs into the lungs every 4 (four) hours as needed for wheezing or shortness of breath. (Patient not taking: Reported on 03/08/2022), Disp: 8 g, Rfl: 1   aspirin 81 MG chewable tablet, Chew 81 mg by mouth daily. (Patient not taking: Reported on 03/21/2022), Disp: , Rfl:     Fluticasone Furoate-Vilanterol (BREO ELLIPTA IN), Inhale into the lungs daily as needed. (Patient not taking: Reported on 02/22/2022), Disp: , Rfl:    Multiple Vitamin (MULTIVITAMIN WITH MINERALS) TABS tablet, Take 1 tablet by mouth daily. (Patient not taking: Reported on 03/15/2022), Disp: , Rfl:  Allergies  Allergen Reactions   Aspirin Itching   Codeine Hives   Coconut (Cocos Nucifera) Itching      Social History   Socioeconomic History   Marital status: Divorced    Spouse name: Not on file   Number of children: Not on file   Years of education: Not on file   Highest education level: Not on file  Occupational History   Occupation: disability  Comment: stopped working in 2000 d/t occupational exposures  Tobacco Use   Smoking status: Former    Types: Cigarettes   Smokeless tobacco: Never   Tobacco comments:    "smoked when I drank"  Vaping Use   Vaping Use: Never used  Substance and Sexual Activity   Alcohol use: Not Currently    Comment: 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  Other Topics Concern   Not on file  Social History Narrative   Patient lives at home with wife and some of his 10 children. He self-administers his own medications.   Social Determinants of Health   Financial Resource Strain: Low Risk  (09/22/2021)   Overall Financial Resource Strain (CARDIA)    Difficulty of Paying Living Expenses: Not hard at all  Food Insecurity: No Food Insecurity (09/22/2021)   Hunger Vital Sign    Worried About Running Out of Food in the Last Year: Never true    Ran Out of Food in the Last Year: Never true  Transportation Needs: Unmet Transportation Needs (09/22/2021)   PRAPARE - Hydrologist (Medical): Yes    Lack of Transportation (Non-Medical): Yes  Physical Activity: Not on file  Stress: Not on file  Social Connections: Not on file  Intimate Partner Violence: Not on file     Physical Exam      Future Appointments  Date Time Provider [place]  04/07/2022 11:00 AM [facility] PA/NP [facility] None  05/29/2022 10:10 AM MC ECHO/CH OP [facility]  05/29/2022 11:40 AM Larey Dresser, MD [facility] None  05/30/2022  7:20 AM [facility] DEVICE REMOTES [facility]  08/29/2022  7:20 AM [facility] DEVICE REMOTES [facility]  11/28/2022  7:20 AM [facility] DEVICE REMOTES [facility]  02/27/2023  7:20 AM [facility] DEVICE REMOTES [facility]       [name], [place] Paramedic  03/21/22

## 2022-03-28 ENCOUNTER — Other Ambulatory Visit (HOSPITAL_COMMUNITY): Payer: Self-pay | Admitting: Emergency Medicine

## 2022-03-28 NOTE — Progress Notes (Signed)
Paramedicine Encounter    Patient ID: Richard [name], male    DOB: [birth year], 61 y.o.   MRN: [id number]   There were no vitals taken for this visit. Weight yesterday-not taken Last visit weight-234.2lb  ATF [name] A&O x 4, skin W&D w/ good color.  Pt denies chest pain or SOB.  Lung sounds clear and equal bilat.  No edema noted. [name] has not been taking his noon meds and most of his evening meds.  We discussed today getting him set up with pill packs.  I will create a list for [facility].   Med box reconciled x 1 week. Also, pt is still desiring to find an apartment.  He says that no one in the home will take him to get applications.  Perhaps he can get applications mailed to him but he would still like to see the accommodations.  I will reach out to Richard [name] for other options/suggestions.  Home visit complete.    Richard [name], [place] 03/28/2022  Patient Care Team: Richard [name] as PCP - General (Internal Medicine) Richard Dresser, MD as PCP - Advanced Heart Failure (Cardiology) Richard Sprang, MD as PCP - Electrophysiology (Cardiology) Richard Ny, LCSW as Social Worker (Licensed Clinical Social Worker)  Patient Active Problem List   Diagnosis Date Noted   Hyperlipidemia 09/09/2021   Wound dehiscence 03/02/2021   Subacute osteomyelitis, right ankle and foot ([place])    Sleep disturbance    Essential hypertension    Diabetic peripheral neuropathy ([place])    Labile blood glucose    Left below-knee amputee ([place]) 02/07/2021   PAD (peripheral artery disease) ([place]) 02/01/2021   Below-knee amputation of left lower extremity ([place]) 01/27/2021   S/P BKA (below knee amputation), left (HCC)    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[place])    SOB (shortness of breath)    Open wound    Bacteremia    Chronic ulcer of left ankle (HCC)    Severe sepsis without septic shock (HCC)    COPD exacerbation ([place]) 78/29/5621   Chronic systolic CHF (congestive  heart failure) ([place]) 01/15/2021   Acute on chronic respiratory failure with hypoxia and hypercapnia ([place]) 01/15/2021   Acute renal failure superimposed on stage 3 chronic kidney disease ([place]) 01/15/2021   Elevated troponin 01/15/2021   Hyponatremia 01/15/2021   CHF exacerbation ([place]) 10/17/2020   Acute on chronic systolic CHF (congestive heart failure) ([place]) 10/16/2020   Acute on chronic combined systolic and diastolic CHF (congestive heart failure) ([place]) 03/15/2018   S/P right and left heart catheterization 11/02/2017   Panic attacks 11/02/2017   Non-STEMI (non-ST elevated myocardial infarction) ([place])    COPD (chronic obstructive pulmonary disease) ([place])    Hypertension    CKD (chronic kidney disease)    Single hamartoma of lung ([place])    Presence of implantable cardioverter-defibrillator (ICD)    GERD (gastroesophageal reflux disease)    Diabetes mellitus with diabetic neuropathy, with long-term current use of insulin ([place])     Current Outpatient Medications:    albuterol (VENTOLIN HFA) 108 (90 Base) MCG/ACT inhaler, Inhale 1-2 puffs into the lungs every 4 (four) hours as needed for wheezing or shortness of breath. (Patient not taking: Reported on 03/08/2022), Disp: 8 g, Rfl: 1   allopurinol (ZYLOPRIM) 100 MG tablet, Take 1 tablet (100 mg total) by mouth daily., Disp: 30 tablet, Rfl: 3   aspirin 81 MG chewable tablet, Chew 81 mg by mouth daily. (Patient  not taking: Reported on 03/21/2022), Disp: , Rfl:    atorvastatin (LIPITOR) 40 MG tablet, TAKE ONE TABLET BY MOUTH DAILY, Disp: 90 tablet, Rfl: 3   bisoprolol (ZEBETA) 5 MG tablet, Take 0.5 tablets (2.5 mg total) by mouth daily., Disp: 45 tablet, Rfl: 3   clopidogrel (PLAVIX) 75 MG tablet, TAKE ONE TABLET BY MOUTH DAILY, Disp: 30 tablet, Rfl: 11   digoxin (LANOXIN) 0.125 MG tablet, Take 1 tablet (0.125 mg total) by mouth daily., Disp: 60 tablet, Rfl: 3   docusate sodium (COLACE) 100 MG capsule, Take 1 capsule (100 mg total) by mouth daily.,  Disp: 10 capsule, Rfl: 0   DULoxetine (CYMBALTA) 60 MG capsule, Take 1 capsule (60 mg total) by mouth daily., Disp: 30 capsule, Rfl: 2   empagliflozin (JARDIANCE) 25 MG TABS tablet, Take 25 mg by mouth daily., Disp: , Rfl:    fenofibrate (TRICOR) 145 MG tablet, Take 145 mg by mouth daily., Disp: , Rfl:    Fluticasone Furoate-Vilanterol (BREO ELLIPTA IN), Inhale into the lungs daily as needed. (Patient not taking: Reported on 02/22/2022), Disp: , Rfl:    gabapentin (NEURONTIN) 400 MG capsule, Take 400 mg by mouth 3 (three) times daily., Disp: , Rfl:    HUMULIN R U-500 KWIKPEN 500 UNIT/ML KwikPen, Inject into the skin., Disp: , Rfl:    hydrALAZINE (APRESOLINE) 50 MG tablet, Take 1.5 tablets (75 mg total) by mouth 3 (three) times daily., Disp: 540 tablet, Rfl: 2   isosorbide mononitrate (IMDUR) 30 MG 24 hr tablet, Take 0.5 tablets (15 mg total) by mouth daily. Do not take if planning to use Viagra, Disp: 30 tablet, Rfl: 11   Magnesium Oxide 200 MG TABS, Take 2 tablets (400 mg total) by mouth daily., Disp: 60 tablet, Rfl: 11   Multiple Vitamin (MULTIVITAMIN WITH MINERALS) TABS tablet, Take 1 tablet by mouth daily. (Patient not taking: Reported on 03/15/2022), Disp: , Rfl:    sacubitril-valsartan (ENTRESTO) 24-26 MG, Take 1 tablet by mouth 2 (two) times daily., Disp: 60 tablet, Rfl: 6   sildenafil (VIAGRA) 50 MG tablet, TAKE 1 TABLET (50 MG TOTAL) BY MOUTH DAILY AS NEEDED FOR ERECTILE DYSFUNCTION., Disp: 10 tablet, Rfl: 0   spironolactone (ALDACTONE) 25 MG tablet, Take 1 tablet (25 mg total) by mouth daily., Disp: 90 tablet, Rfl: 3   torsemide (DEMADEX) 20 MG tablet, Take 2 tablets (40 mg total) by mouth daily., Disp: 30 tablet, Rfl: 6   zinc sulfate 220 (50 Zn) MG capsule, Take 1 capsule (220 mg total) by mouth daily., Disp: 30 capsule, Rfl: 0 Allergies  Allergen Reactions   Aspirin Itching   Codeine Hives   Coconut (Cocos Nucifera) Itching      Social History   Socioeconomic History   Marital  status: Divorced    Spouse name: Not on file   Number of children: Not on file   Years of education: Not on file   Highest education level: Not on file  Occupational History   Occupation: disability    Comment: stopped working in 2000 d/t occupational exposures  Tobacco Use   Smoking status: Former    Types: Cigarettes   Smokeless tobacco: Never   Tobacco comments:    "smoked when I drank"  Vaping Use   Vaping Use: Never used  Substance and Sexual Activity   Alcohol use: Not Currently    Comment: 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  Other Topics Concern   Not on file  Social History Narrative   Patient lives at home with wife and some of his 10 children. He self-administers his own medications.   Social Determinants of Health   Financial Resource Strain: Low Risk  (09/22/2021)   Overall Financial Resource Strain (CARDIA)    Difficulty of Paying Living Expenses: Not hard at all  Food Insecurity: No Food Insecurity (09/22/2021)   Hunger Vital Sign    Worried About Running Out of Food in the Last Year: Never true    Ran Out of Food in the Last Year: Never true  Transportation Needs: Unmet Transportation Needs (09/22/2021)   PRAPARE - Hydrologist (Medical): Yes    Lack of Transportation (Non-Medical): Yes  Physical Activity: Not on file  Stress: Not on file  Social Connections: Not on file  Intimate Partner Violence: Not on file    Physical Exam      Future Appointments  Date Time Provider [place]  04/07/2022 11:00 AM [facility] PA/NP [facility] None  05/29/2022 10:10 AM MC ECHO/CH OP [facility]  05/29/2022 11:40 AM Richard Dresser, MD [facility] None  05/30/2022  7:20 AM [facility] DEVICE REMOTES [facility]  08/29/2022  7:20 AM [facility] DEVICE REMOTES [facility]  11/28/2022  7:20 AM [facility] DEVICE REMOTES [facility]   02/27/2023  7:20 AM [facility] DEVICE REMOTES [facility]       Richard [name], [facility] Paramedic  03/28/22

## 2022-04-04 ENCOUNTER — Other Ambulatory Visit (HOSPITAL_COMMUNITY): Payer: Self-pay | Admitting: Emergency Medicine

## 2022-04-04 NOTE — Progress Notes (Signed)
Paramedicine Encounter    Patient ID: Richard [name], male    DOB: 06-27-1960, 61 y.o.   MRN: [id number]   BP 110/70 (BP Location: Right Arm, Patient Position: Sitting)   Pulse 80   Resp 16   Wt 234 lb (106.1 kg)   SpO2 94%   BMI 27.75 kg/m  Weight yesterday-not taken Last visit weight-234lb  ATF Mr. Jezewski A&O x 4, skin W&D w/ good color.  He reports to be feeling well.  He is still not taking his noon meds which consist of his Hydralazine and Gabapentin.  He also sporadically misses an evening dose here and there.  Once again we discussed med compliance and the importance of same.  Pt. Denies chest pain or SOB.  Lung sounds clear and equal bilat.   Med box reconciled x 1 week.   I have compiled a typed med list with dosing and times for each med and delivered his medications to [facility] for bubble packs.  These are to be set up in weekly packs.   Mr. Alberti is having issues with his Freestyle Libre sensors not working.  I advised I would try and assist him trouble shooting the device.  Also suggested if he has to remove a sensor no to throw it away incase they could be replaced by the manufacturer.  Home visit complete.    [name], [place] 04/04/2022     [name], [place] 04/04/2022  Patient Care Team: Rip Harbour as PCP - General (Internal Medicine) Larey Dresser, MD as PCP - Advanced Heart Failure (Cardiology) Deboraha Sprang, MD as PCP - Electrophysiology (Cardiology) Jorge Ny, LCSW as Social Worker (Licensed Clinical Social Worker)  Patient Active Problem List   Diagnosis Date Noted   Hyperlipidemia 09/09/2021   Wound dehiscence 03/02/2021   Subacute osteomyelitis, right ankle and foot ([place])    Sleep disturbance    Essential hypertension    Diabetic peripheral neuropathy ([place])    Labile blood glucose    Left below-knee amputee ([place]) 02/07/2021   PAD (peripheral artery disease) ([place]) 02/01/2021    Below-knee amputation of left lower extremity ([place]) 01/27/2021   S/P BKA (below knee amputation), left (HCC)    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[place])    SOB (shortness of breath)    Open wound    Bacteremia    Chronic ulcer of left ankle (HCC)    Severe sepsis without septic shock (HCC)    COPD exacerbation ([place]) [id number]   Chronic systolic CHF (congestive heart failure) ([place]) 01/15/2021   Acute on chronic respiratory failure with hypoxia and hypercapnia ([place]) 01/15/2021   Acute renal failure superimposed on stage 3 chronic kidney disease ([place]) 01/15/2021   Elevated troponin 01/15/2021   Hyponatremia 01/15/2021   CHF exacerbation ([place]) 10/17/2020   Acute on chronic systolic CHF (congestive heart failure) ([place]) 10/16/2020   Acute on chronic combined systolic and diastolic CHF (congestive heart failure) ([place]) 03/15/2018   S/P right and left heart catheterization 11/02/2017   Panic attacks 11/02/2017   Non-STEMI (non-ST elevated myocardial infarction) ([place])    COPD (chronic obstructive pulmonary disease) ([place])    Hypertension    CKD (chronic kidney disease)    Single hamartoma of lung ([place])    Presence of implantable cardioverter-defibrillator (ICD)    GERD (gastroesophageal reflux disease)    Diabetes mellitus with diabetic neuropathy, with long-term current use of insulin ([place])     Current Outpatient Medications:  allopurinol (ZYLOPRIM) 100 MG tablet, Take 1 tablet (100 mg total) by mouth daily., Disp: 30 tablet, Rfl: 3   aspirin 81 MG chewable tablet, Chew 81 mg by mouth daily., Disp: , Rfl:    atorvastatin (LIPITOR) 40 MG tablet, TAKE ONE TABLET BY MOUTH DAILY, Disp: 90 tablet, Rfl: 3   bisoprolol (ZEBETA) 5 MG tablet, Take 0.5 tablets (2.5 mg total) by mouth daily., Disp: 45 tablet, Rfl: 3   clopidogrel (PLAVIX) 75 MG tablet, TAKE ONE TABLET BY MOUTH DAILY, Disp: 30 tablet, Rfl: 11   digoxin (LANOXIN) 0.125 MG tablet, Take 1 tablet (0.125 mg total) by mouth daily., Disp:  60 tablet, Rfl: 3   docusate sodium (COLACE) 100 MG capsule, Take 1 capsule (100 mg total) by mouth daily., Disp: 10 capsule, Rfl: 0   DULoxetine (CYMBALTA) 60 MG capsule, Take 1 capsule (60 mg total) by mouth daily., Disp: 30 capsule, Rfl: 2   empagliflozin (JARDIANCE) 25 MG TABS tablet, Take 25 mg by mouth daily., Disp: , Rfl:    fenofibrate (TRICOR) 145 MG tablet, Take 145 mg by mouth daily., Disp: , Rfl:    gabapentin (NEURONTIN) 400 MG capsule, Take 400 mg by mouth 3 (three) times daily., Disp: , Rfl:    HUMULIN R U-500 KWIKPEN 500 UNIT/ML KwikPen, Inject into the skin., Disp: , Rfl:    hydrALAZINE (APRESOLINE) 50 MG tablet, Take 1.5 tablets (75 mg total) by mouth 3 (three) times daily., Disp: 540 tablet, Rfl: 2   isosorbide mononitrate (IMDUR) 30 MG 24 hr tablet, Take 0.5 tablets (15 mg total) by mouth daily. Do not take if planning to use Viagra, Disp: 30 tablet, Rfl: 11   Magnesium Oxide 200 MG TABS, Take 2 tablets (400 mg total) by mouth daily., Disp: 60 tablet, Rfl: 11   sacubitril-valsartan (ENTRESTO) 24-26 MG, Take 1 tablet by mouth 2 (two) times daily., Disp: 60 tablet, Rfl: 6   spironolactone (ALDACTONE) 25 MG tablet, Take 1 tablet (25 mg total) by mouth daily., Disp: 90 tablet, Rfl: 3   torsemide (DEMADEX) 20 MG tablet, Take 2 tablets (40 mg total) by mouth daily., Disp: 30 tablet, Rfl: 6   zinc sulfate 220 (50 Zn) MG capsule, Take 1 capsule (220 mg total) by mouth daily., Disp: 30 capsule, Rfl: 0   albuterol (VENTOLIN HFA) 108 (90 Base) MCG/ACT inhaler, Inhale 1-2 puffs into the lungs every 4 (four) hours as needed for wheezing or shortness of breath. (Patient not taking: Reported on 04/04/2022), Disp: 8 g, Rfl: 1   Fluticasone Furoate-Vilanterol (BREO ELLIPTA IN), Inhale into the lungs daily as needed. (Patient not taking: Reported on 03/28/2022), Disp: , Rfl:    Multiple Vitamin (MULTIVITAMIN WITH MINERALS) TABS tablet, Take 1 tablet by mouth daily. (Patient not taking: Reported on  03/15/2022), Disp: , Rfl:    sildenafil (VIAGRA) 50 MG tablet, TAKE 1 TABLET (50 MG TOTAL) BY MOUTH DAILY AS NEEDED FOR ERECTILE DYSFUNCTION. (Patient not taking: Reported on 04/04/2022), Disp: 10 tablet, Rfl: 0 Allergies  Allergen Reactions   Aspirin Itching   Codeine Hives   Coconut (Cocos Nucifera) Itching      Social History   Socioeconomic History   Marital status: Divorced    Spouse name: Not on file   Number of children: Not on file   Years of education: Not on file   Highest education level: Not on file  Occupational History   Occupation: disability    Comment: stopped working in 2000 d/t occupational exposures  Tobacco Use  Smoking status: Former    Types: Cigarettes   Smokeless tobacco: Never   Tobacco comments:    "smoked when I drank"  Vaping Use   Vaping Use: Never used  Substance and Sexual Activity   Alcohol use: Not Currently    Comment: 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  Other Topics Concern   Not on file  Social History Narrative   Patient lives at home with wife and some of his 10 children. He self-administers his own medications.   Social Determinants of Health   Financial Resource Strain: Low Risk  (09/22/2021)   Overall Financial Resource Strain (CARDIA)    Difficulty of Paying Living Expenses: Not hard at all  Food Insecurity: No Food Insecurity (09/22/2021)   Hunger Vital Sign    Worried About Running Out of Food in the Last Year: Never true    Ran Out of Food in the Last Year: Never true  Transportation Needs: Unmet Transportation Needs (09/22/2021)   PRAPARE - Hydrologist (Medical): Yes    Lack of Transportation (Non-Medical): Yes  Physical Activity: Not on file  Stress: Not on file  Social Connections: Not on file  Intimate Partner Violence: Not on file    Physical Exam      Future Appointments  Date Time Provider [place]  04/07/2022 11:00 AM [facility] PA/NP [facility] None  05/29/2022 10:10 AM MC ECHO/CH OP [facility]  05/29/2022 11:40 AM Larey Dresser, MD MC-[facility] None  05/30/2022  7:20 AM [facility] DEVICE REMOTES [facility]  08/29/2022  7:20 AM [facility] DEVICE REMOTES [facility]  11/28/2022  7:20 AM [facility] DEVICE REMOTES [facility]  02/27/2023  7:20 AM [facility] DEVICE REMOTES [facility]       [name], [facility] Paramedic  04/04/22

## 2022-04-05 ENCOUNTER — Telehealth (HOSPITAL_COMMUNITY): Payer: Self-pay | Admitting: Licensed Clinical Social Worker

## 2022-04-05 NOTE — Telephone Encounter (Signed)
HF Paramedicine Team Based Care Meeting  HF MD- NA  HF NP - [place] NP-C   [facility] admit within the last 30 days for heart failure?   Medications concerns? On bubble packs- about 75% compliant with meds  SDOH - poor living situation not getting along with family that he lives with- hopeful to move to his own apartment  Eligible for discharge? Continue to feel like he needs extra support at this time to promote compliance and avoid admission.  Jorge Ny, LCSW Clinical Social Worker [facility] Desk#: [phone] Cell#: [phone]

## 2022-04-07 ENCOUNTER — Encounter (HOSPITAL_COMMUNITY): Payer: Medicare Other

## 2022-04-07 ENCOUNTER — Other Ambulatory Visit (HOSPITAL_COMMUNITY): Payer: Self-pay | Admitting: Emergency Medicine

## 2022-04-07 NOTE — Progress Notes (Signed)
Scheduled transportation for Richard [name] for his 10/25 HF Clinic appointment.    [name], [place] 04/07/2022

## 2022-04-11 ENCOUNTER — Other Ambulatory Visit (HOSPITAL_COMMUNITY): Payer: Self-pay | Admitting: Emergency Medicine

## 2022-04-11 NOTE — Progress Notes (Signed)
Paramedicine Encounter    Patient ID: Richard [name], male    DOB: 11-14-1960, 61 y.o.   MRN: [id number]   BP (!) 140/100 (BP Location: Right Arm, Patient Position: Sitting, Cuff Size: Normal)   Pulse 80   Resp 16   Wt 226 lb 14.4 oz (102.9 kg)   SpO2 95%   BMI 26.91 kg/m  Weight yesterday-not taken Last visit weight-234lb [id number]  ATF Richard [name] A&O x 4, skin W&D w/ good color.  Pt. Denies chest pain or SOB.  Lung sounds clear and equal bilat.  No edema noted.  Pt has not done well taking his meds.  He has only been taking his morning meds and has missed a whole week of noon and evening med regimen.  Blood pressure elevated this morning.  He has not had his morning meds or eaten today.  Richard [name] has lost 8lbs in 1 week.  He feels like the Ozempic shot is contributing to the weight loss.  He says he's going to stop taking his Ozempic and call Dr. Posey Pronto to advise him same.  He complains of no appetitie.  I reached out to his PCP last week to see about getting a prescription for Omeprazole 20mg  and they approved same and called into [employer].  This way, it will be covered by his medicaid and he doesn't have to purchase it over the counter. Richard [name] started on bubble packs today for his meds.  He has 2 packs for his morning meds, 1 pack for noon & 1 pack for evening.  Reviewed w/ pt and he advises he understands.   Home visit complete.    [name], [place] 04/11/2022   Patient Care Team: Rip Harbour as PCP - General (Internal Medicine) Larey Dresser, MD as PCP - Advanced Heart Failure (Cardiology) Deboraha Sprang, MD as PCP - Electrophysiology (Cardiology) Jorge Ny, LCSW as Social Worker (Licensed Clinical Social Worker)  Patient Active Problem List   Diagnosis Date Noted   Hyperlipidemia 09/09/2021   Wound dehiscence 03/02/2021   Subacute osteomyelitis, right ankle and foot ([place])    Sleep disturbance    Essential hypertension     Diabetic peripheral neuropathy ([place])    Labile blood glucose    Left below-knee amputee ([place]) 02/07/2021   PAD (peripheral artery disease) ([place]) 02/01/2021   Below-knee amputation of left lower extremity ([place]) 01/27/2021   S/P BKA (below knee amputation), left (HCC)    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[place])    SOB (shortness of breath)    Open wound    Bacteremia    Chronic ulcer of left ankle (HCC)    Severe sepsis without septic shock (HCC)    COPD exacerbation ([place]) 80/99/8338   Chronic systolic CHF (congestive heart failure) ([place]) 01/15/2021   Acute on chronic respiratory failure with hypoxia and hypercapnia ([place]) 01/15/2021   Acute renal failure superimposed on stage 3 chronic kidney disease ([place]) 01/15/2021   Elevated troponin 01/15/2021   Hyponatremia 01/15/2021   CHF exacerbation ([place]) 10/17/2020   Acute on chronic systolic CHF (congestive heart failure) ([place]) 10/16/2020   Acute on chronic combined systolic and diastolic CHF (congestive heart failure) ([place]) 03/15/2018   S/P right and left heart catheterization 11/02/2017   Panic attacks 11/02/2017   Non-STEMI (non-ST elevated myocardial infarction) ([place])    COPD (chronic obstructive pulmonary disease) ([place])    Hypertension    CKD (chronic kidney disease)    Single  hamartoma of lung ([place])    Presence of implantable cardioverter-defibrillator (ICD)    GERD (gastroesophageal reflux disease)    Diabetes mellitus with diabetic neuropathy, with long-term current use of insulin (HCC)     Current Outpatient Medications:    albuterol (VENTOLIN HFA) 108 (90 Base) MCG/ACT inhaler, Inhale 1-2 puffs into the lungs every 4 (four) hours as needed for wheezing or shortness of breath. (Patient not taking: Reported on 04/04/2022), Disp: 8 g, Rfl: 1   allopurinol (ZYLOPRIM) 100 MG tablet, Take 1 tablet (100 mg total) by mouth daily., Disp: 30 tablet, Rfl: 3   aspirin 81 MG chewable tablet, Chew 81 mg by mouth daily., Disp: , Rfl:     atorvastatin (LIPITOR) 40 MG tablet, TAKE ONE TABLET BY MOUTH DAILY, Disp: 90 tablet, Rfl: 3   bisoprolol (ZEBETA) 5 MG tablet, Take 0.5 tablets (2.5 mg total) by mouth daily., Disp: 45 tablet, Rfl: 3   clopidogrel (PLAVIX) 75 MG tablet, TAKE ONE TABLET BY MOUTH DAILY, Disp: 30 tablet, Rfl: 11   digoxin (LANOXIN) 0.125 MG tablet, Take 1 tablet (0.125 mg total) by mouth daily., Disp: 60 tablet, Rfl: 3   docusate sodium (COLACE) 100 MG capsule, Take 1 capsule (100 mg total) by mouth daily., Disp: 10 capsule, Rfl: 0   DULoxetine (CYMBALTA) 60 MG capsule, Take 1 capsule (60 mg total) by mouth daily., Disp: 30 capsule, Rfl: 2   empagliflozin (JARDIANCE) 25 MG TABS tablet, Take 25 mg by mouth daily., Disp: , Rfl:    fenofibrate (TRICOR) 145 MG tablet, Take 145 mg by mouth daily., Disp: , Rfl:    Fluticasone Furoate-Vilanterol (BREO ELLIPTA IN), Inhale into the lungs daily as needed. (Patient not taking: Reported on 03/28/2022), Disp: , Rfl:    gabapentin (NEURONTIN) 400 MG capsule, Take 400 mg by mouth 3 (three) times daily., Disp: , Rfl:    HUMULIN R U-500 KWIKPEN 500 UNIT/ML KwikPen, Inject into the skin., Disp: , Rfl:    hydrALAZINE (APRESOLINE) 50 MG tablet, Take 1.5 tablets (75 mg total) by mouth 3 (three) times daily., Disp: 540 tablet, Rfl: 2   isosorbide mononitrate (IMDUR) 30 MG 24 hr tablet, Take 0.5 tablets (15 mg total) by mouth daily. Do not take if planning to use Viagra, Disp: 30 tablet, Rfl: 11   Magnesium Oxide 200 MG TABS, Take 2 tablets (400 mg total) by mouth daily., Disp: 60 tablet, Rfl: 11   Multiple Vitamin (MULTIVITAMIN WITH MINERALS) TABS tablet, Take 1 tablet by mouth daily. (Patient not taking: Reported on 03/15/2022), Disp: , Rfl:    sacubitril-valsartan (ENTRESTO) 24-26 MG, Take 1 tablet by mouth 2 (two) times daily., Disp: 60 tablet, Rfl: 6   sildenafil (VIAGRA) 50 MG tablet, TAKE 1 TABLET (50 MG TOTAL) BY MOUTH DAILY AS NEEDED FOR ERECTILE DYSFUNCTION. (Patient not taking:  Reported on 04/04/2022), Disp: 10 tablet, Rfl: 0   spironolactone (ALDACTONE) 25 MG tablet, Take 1 tablet (25 mg total) by mouth daily., Disp: 90 tablet, Rfl: 3   torsemide (DEMADEX) 20 MG tablet, Take 2 tablets (40 mg total) by mouth daily., Disp: 30 tablet, Rfl: 6   zinc sulfate 220 (50 Zn) MG capsule, Take 1 capsule (220 mg total) by mouth daily., Disp: 30 capsule, Rfl: 0 Allergies  Allergen Reactions   Aspirin Itching   Codeine Hives   Coconut (Cocos Nucifera) Itching      Social History   Socioeconomic History   Marital status: Divorced    Spouse name: Not on file  Number of children: Not on file   Years of education: Not on file   Highest education level: Not on file  Occupational History   Occupation: disability    Comment: stopped working in 2000 d/t occupational exposures  Tobacco Use   Smoking status: Former    Types: Cigarettes   Smokeless tobacco: Never   Tobacco comments:    "smoked when I drank"  Vaping Use   Vaping Use: Never used  Substance and Sexual Activity   Alcohol use: Not Currently    Comment: 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  Other Topics Concern   Not on file  Social History Narrative   Patient lives at home with wife and some of his 10 children. He self-administers his own medications.   Social Determinants of Health   Financial Resource Strain: Low Risk  (09/22/2021)   Overall Financial Resource Strain (CARDIA)    Difficulty of Paying Living Expenses: Not hard at all  Food Insecurity: No Food Insecurity (09/22/2021)   Hunger Vital Sign    Worried About Running Out of Food in the Last Year: Never true    Ran Out of Food in the Last Year: Never true  Transportation Needs: Unmet Transportation Needs (09/22/2021)   PRAPARE - Hydrologist (Medical): Yes    Lack of Transportation (Non-Medical): Yes  Physical Activity: Not on file  Stress: Not  on file  Social Connections: Not on file  Intimate Partner Violence: Not on file    Physical Exam      Future Appointments  Date Time Provider [place]  04/19/2022  1:30 PM [facility] PA/NP [facility] None  05/29/2022 10:10 AM MC ECHO/CH OP [facility]  05/29/2022 11:40 AM Larey Dresser, MD [facility] None  05/30/2022  7:20 AM [facility] DEVICE REMOTES [facility]  08/29/2022  7:20 AM [facility] DEVICE REMOTES [facility]  11/28/2022  7:20 AM [facility] DEVICE REMOTES [facility]  02/27/2023  7:20 AM [facility] DEVICE REMOTES [facility]       [name], [facility] Paramedic  04/11/22

## 2022-04-12 ENCOUNTER — Telehealth (HOSPITAL_COMMUNITY): Payer: Self-pay

## 2022-04-12 NOTE — Telephone Encounter (Signed)
See below, please advise.

## 2022-04-12 NOTE — Telephone Encounter (Signed)
-----   Message from [name], EMT sent at 04/11/2022 12:57 PM EDT ----- Regarding: elevated BP Did a visit with Richard [name] today.  He is down 8lbs from last visit, from 234lb to 226lb.  His blood pressure is elevated today @ 140/100.  Last two visits BP = 03/28/22 100/60,      04/04/22 110/70.  He has been non-compliant with his medicines this past week.  Only taking morning meds,  He's completely missing his noon and evening dose x 7 days. He says he feels like his Ozempic is the reason he's lost the weight.  His non-compliance with his meds seems to be influenced by his living situation and issues with his family. Do you desire any changes?    [name], [place] 04/11/2022

## 2022-04-12 NOTE — Telephone Encounter (Signed)
Just need to reiterate with him the importance of taking all his medications.

## 2022-04-18 ENCOUNTER — Telehealth (HOSPITAL_COMMUNITY): Payer: Self-pay

## 2022-04-18 NOTE — Telephone Encounter (Signed)
Called to confirm/remind patient of their appointment at the [facility] on 04/19/22.   Patient reminded to bring all medications and/or complete list.  Confirmed patient has transportation. Gave directions, instructed to utilize [place] parking.  Confirmed appointment prior to ending call.

## 2022-04-18 NOTE — Progress Notes (Incomplete)
Advanced Heart Failure Clinic Note   ID:  Breyen, Devitt [birth year], MRN [id number]   Provider location: [facility] Type of Visit: Established patient   PCP:  Robert Bellow, PA-C HF Cardiologist:  Dr. Aundra Dubin   HPI Richard [name] is a 61 y.o. male who has a history of chronic systolic HF s/p Medtronic ICD (12/2016), HTN, DM, and HLD.   Admitted [id number] with A/C systolic HF. Echo showed EF 15-20%. Advanced HF team was consulted. He diuresed 20 lbs with IV lasix, then transitioned to torsemide 40 mg daily. Underwent R/LHC showing a focal severe RCA stenosis treated with DES.  He did not, however, appear to have severe enough coronary disease to explain his cardiomyopathy. Cardiac index was low at 1.9.  HF meds were optimized. Beta blocker was not started due to low CI and soft BPs. He was discharged on home oxygen. Referred to cardiac rehab. DC weight: 260 lbs.   Echo in 2/20 showed EF 20-25% with severe LV dilation, moderate RV enlargement with moderately decreased RV systolic function.  CPX in 2/21 showed a severe functional limitation but it actually appeared to be primarily due to pulmonary issues.   Echo in 1/22 showed EF < 20% with severe LV dilation, normal RV size with moderately decreased systolic function, dilated IVC.   He was admitted in 4/22 with CHF.   TEE in 7/22 showed EF 20% with severe LV dilation, moderate RV enlargement with moderately decreased RV systolic function.   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  Today he returns for HF follow up. Weight up 2 lbs.  Walking with his prosthetics, no dyspnea walking on flat ground. No problem mowing grass.  Dyspnea with heavy lifting. No chest pain, no lightheadedness, no orthopnea/PND. Not using oxygen.   Medtronic device interrogation (personally reviewed): Stable thoracic impedance, no AF/VT.   Labs (10/19): K 3.9, creatinine 1.18  Labs (10/20):  BNP 160, K 3.8, creatinine 1.31 Labs (4/21): TGs 475, LDL 67, K 4.2, creatinine 1.8, urine and serum immunofixations negative.  Labs (11/21): LDL 42, HDL 27, K 4.3, creatinine 1.81 Labs (5/22): K 4.5, creatinine 1.46 Labs (11/19/20): SCr 2.0, 4.5  Labs (11/22): K 4.6, creatinine 1.15 Labs (3/22): K 4.3, creatinine 1.46. LDL 42, TG 68  Labs (5/23): K 4.5, creatinine 1.5 Labs (6/23): K 4.4, creatinine 1.47  PMH: 1. Chronic systolic CHF: Primarily nonischemic cardiomyopathy.  Has Medtronic ICD.   - Echo 5/19: EF 15-20%, grade 2 DD, mild MR, RV severely reduced, LA moderately dilated, RA mildly dilated, mild TR.  - RHC/LHC (5/19): 80% distal LAD stenosis, 80% proximal RCA stenosis treated with DES to pRCA. Mean RA 13, PA 69/31 mean 47, mean 24, CI 1.91, PVR 4.8 WU.  - Gynecomastia with spironolactone. - Echo (2/20): EF 20-25%, severe LV enlargement, moderate RV enlargement, moderately decreased RV systolic function.  - CPX (2/21): peak VO2 13.2, VE/VCO2 slope 24, RER 1.10. Severe functional limitation due to OHS with severe restrictive lung disease without significant HF limitation.  - PYP scan (7/21): Negative for TTR cardiac amyloidosis.  - Echo (1/22): EF < 20% with severe LV dilation, normal RV size with moderately decreased systolic function, dilated IVC.  - TEE (7/22): EF 20% with severe LV dilation, moderate RV enlargement with moderately decreased RV systolic function.  2. COPD: Uses 2L home oxygen. Never smoked, but apparently had significant occupational exposure.  It appears that he won  a lawsuit dealing with the occupational exposure-related COPD.  3. CAD: Cath in 5/17 with 60% RCA stenosis.  - LHC (5/19): 80% distal LAD, 80% proximal RCA.  DES to proximal RCA.  4. Polycythemia: Probably due to chronic hypoxemia.  5. Type II diabetes.  6. HTN 7. GERD 8. CKD: Stage 3.  Likely due to diabetes and HTN.  9. OSA: Unable to tolerate CPAP.  10. PAD: Right and left BKA 2022.  11.  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 Group B Strep bacteremia  Review of systems complete and found to be negative unless listed in HPI.    Current Outpatient Medications  Medication Sig Dispense Refill   albuterol (VENTOLIN HFA) 108 (90 Base) MCG/ACT inhaler Inhale 1-2 puffs into the lungs every 4 (four) hours as needed for wheezing or shortness of breath. (Patient not taking: Reported on 04/04/2022) 8 g 1   allopurinol (ZYLOPRIM) 100 MG tablet Take 1 tablet (100 mg total) by mouth daily. 30 tablet 3   aspirin 81 MG chewable tablet Chew 81 mg by mouth daily.     atorvastatin (LIPITOR) 40 MG tablet TAKE ONE TABLET BY MOUTH DAILY 90 tablet 3   bisoprolol (ZEBETA) 5 MG tablet Take 0.5 tablets (2.5 mg total) by mouth daily. 45 tablet 3   clopidogrel (PLAVIX) 75 MG tablet TAKE ONE TABLET BY MOUTH DAILY 30 tablet 11   digoxin (LANOXIN) 0.125 MG tablet Take 1 tablet (0.125 mg total) by mouth daily. 60 tablet 3   docusate sodium (COLACE) 100 MG capsule Take 1 capsule (100 mg total) by mouth daily. 10 capsule 0   DULoxetine (CYMBALTA) 60 MG capsule Take 1 capsule (60 mg total) by mouth daily. 30 capsule 2   empagliflozin (JARDIANCE) 25 MG TABS tablet Take 25 mg by mouth daily.     fenofibrate (TRICOR) 145 MG tablet Take 145 mg by mouth daily.     Fluticasone Furoate-Vilanterol (BREO ELLIPTA IN) Inhale into the lungs daily as needed. (Patient not taking: Reported on 03/28/2022)     gabapentin (NEURONTIN) 400 MG capsule Take 400 mg by mouth 3 (three) times daily.     HUMULIN R U-500 KWIKPEN 500 UNIT/ML KwikPen Inject into the skin.     hydrALAZINE (APRESOLINE) 50 MG tablet Take 1.5 tablets (75 mg total) by mouth 3 (three) times daily. 540 tablet 2   isosorbide mononitrate (IMDUR) 30 MG 24 hr tablet Take 0.5 tablets (15 mg total) by mouth daily. Do not take if planning to use Viagra 30 tablet 11   Magnesium Oxide 200 MG TABS Take 2 tablets (400 mg total) by mouth daily. 60 tablet 11   Multiple  Vitamin (MULTIVITAMIN WITH MINERALS) TABS tablet Take 1 tablet by mouth daily. (Patient not taking: Reported on 03/15/2022)     sacubitril-valsartan (ENTRESTO) 24-26 MG Take 1 tablet by mouth 2 (two) times daily. 60 tablet 6   sildenafil (VIAGRA) 50 MG tablet TAKE 1 TABLET (50 MG TOTAL) BY MOUTH DAILY AS NEEDED FOR ERECTILE DYSFUNCTION. (Patient not taking: Reported on 04/04/2022) 10 tablet 0   spironolactone (ALDACTONE) 25 MG tablet Take 1 tablet (25 mg total) by mouth daily. 90 tablet 3   torsemide (DEMADEX) 20 MG tablet Take 2 tablets (40 mg total) by mouth daily. 30 tablet 6   zinc sulfate 220 (50 Zn) MG capsule Take 1 capsule (220 mg total) by mouth daily. 30 capsule 0   No current facility-administered medications for this visit.   Allergies  Allergen Reactions  Aspirin Itching   Codeine Hives   Coconut (Cocos Nucifera) Itching   Social History   Socioeconomic History   Marital status: Divorced    Spouse name: Not on file   Number of children: Not on file   Years of education: Not on file   Highest education level: Not on file  Occupational History   Occupation: disability    Comment: stopped working in 2000 d/t occupational exposures  Tobacco Use   Smoking status: Former    Types: Cigarettes   Smokeless tobacco: Never   Tobacco comments:    "smoked when I drank"  Vaping Use   Vaping Use: Never used  Substance and Sexual Activity   Alcohol use: Not Currently    Comment: 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  Other Topics Concern   Not on file  Social History Narrative   Patient lives at home with wife and some of his 10 children. He self-administers his own medications.   Social Determinants of Health   Financial Resource Strain: Low Risk  (09/22/2021)   Overall Financial Resource Strain (CARDIA)    Difficulty of Paying Living Expenses: Not hard at all  Food Insecurity: No Food Insecurity  (09/22/2021)   Hunger Vital Sign    Worried About Running Out of Food in the Last Year: Never true    Ran Out of Food in the Last Year: Never true  Transportation Needs: Unmet Transportation Needs (09/22/2021)   PRAPARE - Hydrologist (Medical): Yes    Lack of Transportation (Non-Medical): Yes  Physical Activity: Not on file  Stress: Not on file  Social Connections: Not on file  Intimate Partner Violence: Not on file   Family History  Problem Relation Age of Onset   Hypertension Mother    Diabetes Mother    Hypertension Father    Diabetes Father    Diabetes Sister    Diabetes Brother    There were no vitals taken for this visit.  Wt Readings from Last 3 Encounters:  04/11/22 102.9 kg (226 lb 14.4 oz)  04/04/22 106.1 kg (234 lb)  03/28/22 106.1 kg (234 lb)   PHYSICAL EXAM: General: NAD Neck: No JVD, no thyromegaly or thyroid nodule.  Lungs: Clear to auscultation bilaterally with normal respiratory effort. CV: Nondisplaced PMI.  Heart regular S1/S2, no S3/S4, no murmur.  No peripheral edema.  No carotid bruit.  Normal pedal pulses.  Abdomen: Soft, nontender, no hepatosplenomegaly, no distention.  Skin: Intact without lesions or rashes.  Neurologic: Alert and oriented x 3.  Psych: Normal affect. Extremities: No clubbing or cyanosis. Bilateral BKAs HEENT: Normal.   ASSESSMENT & PLAN: 1. Chronic systolic CHF:  Primarily nonischemic cardiomyopathy.  Echo in 2017 with EF 15-20%.  Medtronic ICD. Echo 10/2017 EF 15-20%, severely dilated RV with severely decreased systolic function.  LHC/RHC 11/06/17 showed volume overload with 80% RCA stenosis.  Cardiac index low at 1.91. The degree of coronary disease does not explain his cardiomyopathy.  Echo in 2/20 showed severe LV dilation, EF 20-25%, moderately decreased RV systolic function.  PYP scan in 7/21 not suggestive of transthyretin amyloidosis. Echo in 1/22 showed EF < 20%, moderately decreased RV systolic  function.  TEE in 7/22 with EF 20% with severe LV dilation, moderate RV enlargement with moderately decreased RV systolic function. NYHA class II symptoms, he is not volume overloaded by exam or Optivol.  -  Continue Imdur 15 mg daily and hydralazine 75 mg tid.  - Continue torsemide 40 mg daily.  - Continue Entresto 24-26 mg bid. - Continue empaglifozin.  - Increase spironolactone to 25 mg daily with BMET today and again in 10 days.   - Continue digoxin 0.125 mg daily, check level.  - Increase bisoprolol to 5 mg daily.  - Continue Paramedicine.  - I will arrange for repeat echo.  2. COPD: Never smoked, but apparently had significant occupational exposure.  It appears that he won a lawsuit dealing with the occupational exposure-related COPD. CPX in 2/21 showed severe functional limitation, but it appeared to be due to pulmonary abnormalities rather than HF. He was on home oxygen in the past but is not using it now.   - Followup with pulmonary.  3. CAD:  LHC in 5/19 with 80-90% proximal RCA stenosis treated with DES to RCA.  This did not cause his cardiomyopathy but is a large RCA. No chest pain.  - Continue ASA 81 daily.  - Continue atorvastatin, LDL 42 (3/23).  4. Polycythemia: Likely related to chronic hypoxia.  5. OSA: Cannot tolerate CPAP, wears oxygen at night.  6. DM: He is on empagliflozin.   7. Hypertriglyceridemia: Off Vascepa due to blood in stool that resolved.  - Continue fenofibrate 145 mg daily. TGs well controlled on recent LP, 68  8. PVCs: 3 day Zio (4/23) 1.3% PVC burden. If burden increases, consider AAD for suppression (would opt for mexiletine vs amiodarone due to COPD). 9. ED: Has prescription for sildenafil. Instructed NOT to take Imdur if planning on using sildenafil.   Followup 3 wks with HF pharmacist for med titration, see APP in 2 months.     Signed, Rafael Bihari, FNP  04/18/2022  [facility] [address] [facility]  [place] [address] [phone] (office) [phone] (fax)

## 2022-04-19 ENCOUNTER — Ambulatory Visit (HOSPITAL_COMMUNITY)
Admission: RE | Admit: 2022-04-19 | Discharge: 2022-04-19 | Disposition: A | Discharge status: Home / Self Care | Payer: Medicare Other | Source: Ambulatory Visit | Attending: Family Medicine | Admitting: Family Medicine

## 2022-04-19 ENCOUNTER — Other Ambulatory Visit (HOSPITAL_COMMUNITY): Payer: Self-pay | Admitting: Emergency Medicine

## 2022-04-19 ENCOUNTER — Encounter (HOSPITAL_COMMUNITY): Payer: Self-pay

## 2022-04-19 VITALS — BP 122/90 | HR 77 | Wt 232.0 lb

## 2022-04-19 DIAGNOSIS — I130 Hypertensive heart and chronic kidney disease with heart failure and stage 1 through stage 4 chronic kidney disease, or unspecified chronic kidney disease: Secondary | ICD-10-CM | POA: Insufficient documentation

## 2022-04-19 DIAGNOSIS — E781 Pure hyperglyceridemia: Secondary | ICD-10-CM | POA: Diagnosis not present

## 2022-04-19 DIAGNOSIS — Z955 Presence of coronary angioplasty implant and graft: Secondary | ICD-10-CM | POA: Diagnosis not present

## 2022-04-19 DIAGNOSIS — N529 Male erectile dysfunction, unspecified: Secondary | ICD-10-CM

## 2022-04-19 DIAGNOSIS — E1122 Type 2 diabetes mellitus with diabetic chronic kidney disease: Secondary | ICD-10-CM | POA: Diagnosis not present

## 2022-04-19 DIAGNOSIS — G4733 Obstructive sleep apnea (adult) (pediatric): Secondary | ICD-10-CM

## 2022-04-19 DIAGNOSIS — J449 Chronic obstructive pulmonary disease, unspecified: Secondary | ICD-10-CM | POA: Diagnosis not present

## 2022-04-19 DIAGNOSIS — Z794 Long term (current) use of insulin: Secondary | ICD-10-CM

## 2022-04-19 DIAGNOSIS — I493 Ventricular premature depolarization: Secondary | ICD-10-CM

## 2022-04-19 DIAGNOSIS — I428 Other cardiomyopathies: Secondary | ICD-10-CM | POA: Insufficient documentation

## 2022-04-19 DIAGNOSIS — I2510 Atherosclerotic heart disease of native coronary artery without angina pectoris: Secondary | ICD-10-CM | POA: Diagnosis not present

## 2022-04-19 DIAGNOSIS — Z79899 Other long term (current) drug therapy: Secondary | ICD-10-CM | POA: Diagnosis not present

## 2022-04-19 DIAGNOSIS — D751 Secondary polycythemia: Secondary | ICD-10-CM

## 2022-04-19 DIAGNOSIS — Z95810 Presence of automatic (implantable) cardiac defibrillator: Secondary | ICD-10-CM | POA: Insufficient documentation

## 2022-04-19 DIAGNOSIS — E1159 Type 2 diabetes mellitus with other circulatory complications: Secondary | ICD-10-CM

## 2022-04-19 DIAGNOSIS — Z7982 Long term (current) use of aspirin: Secondary | ICD-10-CM | POA: Diagnosis not present

## 2022-04-19 DIAGNOSIS — I5022 Chronic systolic (congestive) heart failure: Secondary | ICD-10-CM

## 2022-04-19 DIAGNOSIS — E785 Hyperlipidemia, unspecified: Secondary | ICD-10-CM

## 2022-04-19 LAB — BASIC METABOLIC PANEL
Anion gap: 9 (ref 5–15)
BUN: 35 mg/dL — ABNORMAL HIGH (ref 6–20)
CO2: 25 mmol/L (ref 22–32)
Calcium: 9.1 mg/dL (ref 8.9–10.3)
Chloride: 103 mmol/L (ref 98–111)
Creatinine, Ser: 1.36 mg/dL — ABNORMAL HIGH (ref 0.61–1.24)
GFR, Estimated: 60 mL/min — ABNORMAL LOW (ref >60)
Glucose, Bld: 186 mg/dL — ABNORMAL HIGH (ref 70–99)
Potassium: 3.9 mmol/L (ref 3.5–5.1)
Sodium: 137 mmol/L (ref 135–145)

## 2022-04-19 LAB — DIGOXIN LEVEL: Digoxin Level: 0.3 ng/mL — ABNORMAL LOW (ref 0.8–2.0)

## 2022-04-19 MED ORDER — ASPIRIN 81 MG PO TBEC: 81.0000 mg | DELAYED_RELEASE_TABLET | Freq: Every day | ORAL | 3 refills | Status: DC

## 2022-04-19 NOTE — Progress Notes (Signed)
Paramedicine Encounter   Patient ID: Richard [name] , male,   DOB: 07/28/1960,61 y.o.,  MRN: [id number]   Met patient in clinic today with provider.  Time spent with patient 35 minutes  Met with Richard [name] and Richard Milford, NP today in clinic.  Pt reports to be feeling "fair".  He has not done very well being compliant with his medications.  He has quite a few home issues that seems to be influencing his lack of compliance.  I have encouraged him to stay on track to keep from feeling worse.  Pt also expresses that his Ozempic is making him nauseous an his appetite has been decreased.  He says sometimes when he misses his daily dosing it's because he doesn't have an appetite and doesn't want to take the meds on an empty stomach and feel sicker.  Richard [name] expressed that he has never been in such a bad situation at home and desperately wants to move out.  Richard advised him she would reach out to Richard [name] regarding possible options.  Richard [name] has provided Richard [name] with housing resources in the past but he does not have any transportation and says no one in the home will take him anywhere to try and find a place to live. No medication changes at this time.  Visit complete.    [name] Smith, EMT-Paramedic  [phone] 04/19/2022  

## 2022-04-19 NOTE — Patient Instructions (Signed)
Great to see you today! Restart Aspirin 81 mg daily. Labwork completed today and we only call you with abnormal values.  Please call next month to schedule appointment with Dr. Aundra Dubin in [facility] February.  At the [facility], you and your health needs are our priority. We have a designated team specialized in the treatment of Heart Failure. This Care Team includes your primary Heart Failure Specialized Cardiologist (physician), Advanced Practice Providers (APPs- Physician Assistants and Nurse Practitioners), and Pharmacist who all work together to provide you with the care you need, when you need it.   You may see any of the following providers on your designated Care Team at your next follow up:  Dr. Glori Bickers Dr. Loralie Champagne Dr. Roxana Hires, NP Lyda Jester, [place] [facility] [place], [place] Forestine Na, NP Audry Riles, PharmD   Please be sure to bring in all your medications bottles to every appointment.   Need to Contact us:  If you have any questions or concerns before your next appointment please send [place] a message through [place] or call our office at [phone].    TO LEAVE A MESSAGE FOR THE NURSE SELECT OPTION 2, PLEASE LEAVE A MESSAGE INCLUDING: YOUR NAME DATE OF BIRTH CALL BACK NUMBER REASON FOR CALL**this is important as we prioritize the call backs  YOU WILL RECEIVE A CALL BACK THE SAME DAY AS LONG AS YOU CALL BEFORE 4:00 PM

## 2022-04-25 ENCOUNTER — Other Ambulatory Visit (HOSPITAL_COMMUNITY): Payer: Self-pay | Admitting: Emergency Medicine

## 2022-04-25 NOTE — Progress Notes (Unsigned)
Paramedicine Encounter    Patient ID: Richard [name], male    DOB: 12-Nov-1960, 61 y.o.   MRN: [id number]   BP 110/80 (BP Location: Right Arm, Patient Position: Sitting, Cuff Size: Normal)   Pulse 78   Resp 16   Wt 229 lb 8 oz (104.1 kg)   BMI 27.21 kg/m  Weight yesterday-not taken Last visit weight-232lb   ATF Mr. Friedhoff A&O x 4, skin W&D w/ good color.  He denies chest pain or SOB.  Lung sounds clear and equal bilat.  No edema noted.  His weight is down from last visit.  He states he has stopped taking his Ozempic and feeling better,.He is doing well with his meds in bubble packs but he is not consistently taking his noon dose which consists of Gabapentin and Hydralazine.   He says, "I don't feel like I need it."  I continue to encourage him that if he's not going to take his Gabapentin to at least open the bubble pack and take the Hydralazine.  Home visit complete.    Beatrix Shipper, EMT-Paramedic [phone] 04/26/2022  Patient Care Team: Olevia Perches as PCP - General (Internal Medicine) Laurey Morale, MD as PCP - Advanced Heart Failure (Cardiology) Duke Salvia, MD as PCP - Electrophysiology (Cardiology) Burna Sis, LCSW as Social Worker (Licensed Clinical Social Worker)  Patient Active Problem List   Diagnosis Date Noted   Hyperlipidemia 09/09/2021   Wound dehiscence 03/02/2021   Subacute osteomyelitis, right ankle and foot (HCC)    Sleep disturbance    Essential hypertension    Diabetic peripheral neuropathy (HCC)    Labile blood glucose    Left below-knee amputee (HCC) 02/07/2021   PAD (peripheral artery disease) (HCC) 02/01/2021   Below-knee amputation of left lower extremity (HCC) 01/27/2021   S/P BKA (below knee amputation), left (HCC)    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HCC)    SOB (shortness of breath)    Open wound    Bacteremia    Chronic ulcer of left ankle (HCC)    Severe sepsis without septic shock (HCC)    COPD exacerbation (HCC)  01/15/2021   Chronic systolic CHF (congestive heart failure) (HCC) 01/15/2021   Acute on chronic respiratory failure with hypoxia and hypercapnia (HCC) 01/15/2021   Acute renal failure superimposed on stage 3 chronic kidney disease (HCC) 01/15/2021   Elevated troponin 01/15/2021   Hyponatremia 01/15/2021   CHF exacerbation (HCC) 10/17/2020   Acute on chronic systolic CHF (congestive heart failure) (HCC) 10/16/2020   Acute on chronic combined systolic and diastolic CHF (congestive heart failure) (HCC) 03/15/2018   S/P right and left heart catheterization 11/02/2017   Panic attacks 11/02/2017   Non-STEMI (non-ST elevated myocardial infarction) (HCC)    COPD (chronic obstructive pulmonary disease) (HCC)    Hypertension    CKD (chronic kidney disease)    Single hamartoma of lung (HCC)    Presence of implantable cardioverter-defibrillator (ICD)    GERD (gastroesophageal reflux disease)    Diabetes mellitus with diabetic neuropathy, with long-term current use of insulin (HCC)     Current Outpatient Medications:    albuterol (VENTOLIN HFA) 108 (90 Base) MCG/ACT inhaler, Inhale 1-2 puffs into the lungs every 4 (four) hours as needed for wheezing or shortness of breath., Disp: 8 g, Rfl: 1   allopurinol (ZYLOPRIM) 100 MG tablet, Take 1 tablet (100 mg total) by mouth daily., Disp: 30 tablet, Rfl: 3   aspirin EC 81 MG tablet, Take  1 tablet (81 mg total) by mouth daily. Swallow whole., Disp: 90 tablet, Rfl: 3   atorvastatin (LIPITOR) 40 MG tablet, TAKE ONE TABLET BY MOUTH DAILY, Disp: 90 tablet, Rfl: 3   bisoprolol (ZEBETA) 5 MG tablet, Take 0.5 tablets (2.5 mg total) by mouth daily., Disp: 45 tablet, Rfl: 3   clopidogrel (PLAVIX) 75 MG tablet, TAKE ONE TABLET BY MOUTH DAILY, Disp: 30 tablet, Rfl: 11   digoxin (LANOXIN) 0.125 MG tablet, Take 1 tablet (0.125 mg total) by mouth daily., Disp: 60 tablet, Rfl: 3   docusate sodium (COLACE) 100 MG capsule, Take 1 capsule (100 mg total) by mouth daily.,  Disp: 10 capsule, Rfl: 0   DULoxetine (CYMBALTA) 60 MG capsule, Take 1 capsule (60 mg total) by mouth daily., Disp: 30 capsule, Rfl: 2   empagliflozin (JARDIANCE) 25 MG TABS tablet, Take 25 mg by mouth daily., Disp: , Rfl:    fenofibrate (TRICOR) 145 MG tablet, Take 145 mg by mouth daily., Disp: , Rfl:    Fluticasone Furoate-Vilanterol (BREO ELLIPTA IN), Inhale into the lungs daily as needed., Disp: , Rfl:    gabapentin (NEURONTIN) 400 MG capsule, Take 400 mg by mouth 3 (three) times daily., Disp: , Rfl:    HUMULIN R U-500 KWIKPEN 500 UNIT/ML KwikPen, Inject into the skin., Disp: , Rfl:    hydrALAZINE (APRESOLINE) 50 MG tablet, Take 1.5 tablets (75 mg total) by mouth 3 (three) times daily., Disp: 540 tablet, Rfl: 2   isosorbide mononitrate (IMDUR) 30 MG 24 hr tablet, Take 0.5 tablets (15 mg total) by mouth daily. Do not take if planning to use Viagra, Disp: 30 tablet, Rfl: 11   Magnesium Oxide 200 MG TABS, Take 2 tablets (400 mg total) by mouth daily., Disp: 60 tablet, Rfl: 11   sacubitril-valsartan (ENTRESTO) 24-26 MG, Take 1 tablet by mouth 2 (two) times daily., Disp: 60 tablet, Rfl: 6   sildenafil (VIAGRA) 50 MG tablet, TAKE 1 TABLET (50 MG TOTAL) BY MOUTH DAILY AS NEEDED FOR ERECTILE DYSFUNCTION., Disp: 10 tablet, Rfl: 0   spironolactone (ALDACTONE) 25 MG tablet, Take 1 tablet (25 mg total) by mouth daily., Disp: 90 tablet, Rfl: 3   torsemide (DEMADEX) 20 MG tablet, Take 2 tablets (40 mg total) by mouth daily., Disp: 30 tablet, Rfl: 6   zinc sulfate 220 (50 Zn) MG capsule, Take 1 capsule (220 mg total) by mouth daily., Disp: 30 capsule, Rfl: 0 Allergies  Allergen Reactions   Aspirin Itching   Codeine Hives   Coconut (Cocos Nucifera) Itching      Social History   Socioeconomic History   Marital status: Divorced    Spouse name: Not on file   Number of children: Not on file   Years of education: Not on file   Highest education level: Not on file  Occupational History   Occupation:  disability    Comment: stopped working in 2000 d/t occupational exposures  Tobacco Use   Smoking status: Former    Types: Cigarettes   Smokeless tobacco: Never   Tobacco comments:    "smoked when I drank"  Vaping Use   Vaping Use: Never used  Substance and Sexual Activity   Alcohol use: Not Currently    Comment: 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  Other Topics Concern   Not on file  Social History Narrative   Patient lives at home with wife and some of his 66  children. He self-administers his own medications.   Social Determinants of Health   Financial Resource Strain: Low Risk  (09/22/2021)   Overall Financial Resource Strain (CARDIA)    Difficulty of Paying Living Expenses: Not hard at all  Food Insecurity: No Food Insecurity (09/22/2021)   Hunger Vital Sign    Worried About Running Out of Food in the Last Year: Never true    Ran Out of Food in the Last Year: Never true  Transportation Needs: Unmet Transportation Needs (09/22/2021)   PRAPARE - Hydrologist (Medical): Yes    Lack of Transportation (Non-Medical): Yes  Physical Activity: Not on file  Stress: Not on file  Social Connections: Not on file  Intimate Partner Violence: Not on file    Physical Exam      Future Appointments  Date Time Provider [place]  05/29/2022 10:10 AM [facility] [place] OP [facility]  05/29/2022 11:40 AM Larey Dresser, MD [facility] None  05/30/2022  7:20 AM [facility] DEVICE REMOTES [facility]  08/29/2022  7:20 AM [facility] DEVICE REMOTES [facility]  11/28/2022  7:20 AM [facility] DEVICE REMOTES [facility]  02/27/2023  7:20 AM [facility] DEVICE REMOTES [facility]       [name], [place] [facility] Paramedic  04/25/22

## 2022-04-28 ENCOUNTER — Telehealth (HOSPITAL_COMMUNITY): Payer: Self-pay | Admitting: Licensed Clinical Social Worker

## 2022-04-28 NOTE — Telephone Encounter (Signed)
H&V Care Navigation CSW Progress Note  Clinical Social Worker consulted to speak with pt about housing concerns.  Pt currently in bad housing situation living with family members who he is in conflict with.  Having trouble finding new housing due to cost and transportation barriers.  Pt makes $1,178/month and thinks he could afford $600 in rent.  Working on applying for [employer] but wanting to look at other options- list of senior and other affordable housing being given to paramedic to take to pt.  Transportation also a barrier so CSW discussed [facility]AT and sending pt application to complete.    SDOH Screenings   Food Insecurity: No Food Insecurity (09/22/2021)  Housing: Low Risk  (04/28/2022)  Transportation Needs: Unmet Transportation Needs (09/22/2021)  Financial Resource Strain: Low Risk  (09/22/2021)  Tobacco Use: Medium Risk (04/19/2022)   Jorge Ny, [facility] Desk#: [phone] Cell#: [phone]

## 2022-05-05 ENCOUNTER — Other Ambulatory Visit (HOSPITAL_COMMUNITY): Payer: Self-pay | Admitting: Cardiology

## 2022-05-09 ENCOUNTER — Other Ambulatory Visit (HOSPITAL_COMMUNITY): Payer: Self-pay | Admitting: Emergency Medicine

## 2022-05-09 NOTE — Progress Notes (Addendum)
Paramedicine Encounter    Patient ID: Richard [name], male    DOB: October 02, 1960, 61 y.o.   MRN: [id number]   BP 120/80 (BP Location: Left Arm, Patient Position: Sitting, Cuff Size: Normal)   Pulse 76   Resp 16   Wt 232 lb 8 oz (105.5 kg)   BMI 27.57 kg/m  Weight yesterday-not taken Last visit weight-229.8lb   ATF [name] A&O x 4, skin W&D w/ good color.  He denies chest pain or SOB.  Lung sounds clear and equal bilat.  He his doing better with his meds in bubble packs than he was from a pill box.  He has received his next months bubble packs from [employer]   I delivered paperwork from Rosetta Posner for [employer] and Housing information.  He states that he has got to get out of his house because he is being treated so poorly by his wife and kids.  No one at the home will help him get to places to look at apartments and he's really frustrated.  I advised him to call me should he need assistance.  Home visit complete.    Beatrix Shipper, EMT-Paramedic [phone] 05/09/2022   Patient Care Team: Olevia Perches as PCP - General (Internal Medicine) Laurey Morale, MD as PCP - Advanced Heart Failure (Cardiology) Duke Salvia, MD as PCP - Electrophysiology (Cardiology) Burna Sis, LCSW as Social Worker (Licensed Clinical Social Worker)  Patient Active Problem List   Diagnosis Date Noted   Hyperlipidemia 09/09/2021   Wound dehiscence 03/02/2021   Subacute osteomyelitis, right ankle and foot (HCC)    Sleep disturbance    Essential hypertension    Diabetic peripheral neuropathy (HCC)    Labile blood glucose    Left below-knee amputee (HCC) 02/07/2021   PAD (peripheral artery disease) (HCC) 02/01/2021   Below-knee amputation of left lower extremity (HCC) 01/27/2021   S/P BKA (below knee amputation), left (HCC)    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HCC)    SOB (shortness of breath)    Open wound    Bacteremia    Chronic ulcer of left ankle (HCC)    Severe sepsis without  septic shock (HCC)    COPD exacerbation (HCC) 01/15/2021   Chronic systolic CHF (congestive heart failure) (HCC) 01/15/2021   Acute on chronic respiratory failure with hypoxia and hypercapnia (HCC) 01/15/2021   Acute renal failure superimposed on stage 3 chronic kidney disease (HCC) 01/15/2021   Elevated troponin 01/15/2021   Hyponatremia 01/15/2021   CHF exacerbation (HCC) 10/17/2020   Acute on chronic systolic CHF (congestive heart failure) (HCC) 10/16/2020   Acute on chronic combined systolic and diastolic CHF (congestive heart failure) (HCC) 03/15/2018   S/P right and left heart catheterization 11/02/2017   Panic attacks 11/02/2017   Non-STEMI (non-ST elevated myocardial infarction) (HCC)    COPD (chronic obstructive pulmonary disease) (HCC)    Hypertension    CKD (chronic kidney disease)    Single hamartoma of lung (HCC)    Presence of implantable cardioverter-defibrillator (ICD)    GERD (gastroesophageal reflux disease)    Diabetes mellitus with diabetic neuropathy, with long-term current use of insulin (HCC)     Current Outpatient Medications:    allopurinol (ZYLOPRIM) 100 MG tablet, Take 1 tablet (100 mg total) by mouth daily., Disp: 30 tablet, Rfl: 3   aspirin EC 81 MG tablet, Take 1 tablet (81 mg total) by mouth daily. Swallow whole., Disp: 90 tablet, Rfl: 3  atorvastatin (LIPITOR) 40 MG tablet, TAKE ONE TABLET BY MOUTH DAILY, Disp: 90 tablet, Rfl: 3   bisoprolol (ZEBETA) 5 MG tablet, Take 0.5 tablets (2.5 mg total) by mouth daily., Disp: 45 tablet, Rfl: 3   clopidogrel (PLAVIX) 75 MG tablet, TAKE ONE TABLET BY MOUTH DAILY, Disp: 30 tablet, Rfl: 11   digoxin (LANOXIN) 0.125 MG tablet, TAKE 1 TABLET (0.125 MG TOTAL) BY MOUTH DAILY., Disp: 60 tablet, Rfl: 6   docusate sodium (COLACE) 100 MG capsule, Take 1 capsule (100 mg total) by mouth daily., Disp: 10 capsule, Rfl: 0   DULoxetine (CYMBALTA) 60 MG capsule, Take 1 capsule (60 mg total) by mouth daily., Disp: 30 capsule, Rfl:  2   empagliflozin (JARDIANCE) 25 MG TABS tablet, Take 25 mg by mouth daily., Disp: , Rfl:    fenofibrate (TRICOR) 145 MG tablet, Take 145 mg by mouth daily., Disp: , Rfl:    gabapentin (NEURONTIN) 400 MG capsule, Take 400 mg by mouth 3 (three) times daily., Disp: , Rfl:    HUMULIN R U-500 KWIKPEN 500 UNIT/ML KwikPen, Inject into the skin., Disp: , Rfl:    hydrALAZINE (APRESOLINE) 50 MG tablet, Take 1.5 tablets (75 mg total) by mouth 3 (three) times daily., Disp: 540 tablet, Rfl: 2   isosorbide mononitrate (IMDUR) 30 MG 24 hr tablet, Take 0.5 tablets (15 mg total) by mouth daily. Do not take if planning to use Viagra, Disp: 30 tablet, Rfl: 11   Magnesium Oxide 200 MG TABS, Take 2 tablets (400 mg total) by mouth daily., Disp: 60 tablet, Rfl: 11   sacubitril-valsartan (ENTRESTO) 24-26 MG, Take 1 tablet by mouth 2 (two) times daily., Disp: 60 tablet, Rfl: 6   sildenafil (VIAGRA) 50 MG tablet, TAKE 1 TABLET (50 MG TOTAL) BY MOUTH DAILY AS NEEDED FOR ERECTILE DYSFUNCTION., Disp: 10 tablet, Rfl: 0   spironolactone (ALDACTONE) 25 MG tablet, Take 1 tablet (25 mg total) by mouth daily., Disp: 90 tablet, Rfl: 3   torsemide (DEMADEX) 20 MG tablet, Take 2 tablets (40 mg total) by mouth daily., Disp: 30 tablet, Rfl: 6   zinc sulfate 220 (50 Zn) MG capsule, Take 1 capsule (220 mg total) by mouth daily., Disp: 30 capsule, Rfl: 0   albuterol (VENTOLIN HFA) 108 (90 Base) MCG/ACT inhaler, Inhale 1-2 puffs into the lungs every 4 (four) hours as needed for wheezing or shortness of breath. (Patient not taking: Reported on 05/09/2022), Disp: 8 g, Rfl: 1   Fluticasone Furoate-Vilanterol (BREO ELLIPTA IN), Inhale into the lungs daily as needed. (Patient not taking: Reported on 05/09/2022), Disp: , Rfl:  Allergies  Allergen Reactions   Aspirin Itching   Codeine Hives   Coconut (Cocos Nucifera) Itching      Social History   Socioeconomic History   Marital status: Divorced    Spouse name: Not on file   Number of  children: Not on file   Years of education: Not on file   Highest education level: Not on file  Occupational History   Occupation: disability    Comment: stopped working in 2000 d/t occupational exposures  Tobacco Use   Smoking status: Former    Types: Cigarettes   Smokeless tobacco: Never   Tobacco comments:    "smoked when I drank"  Vaping Use   Vaping Use: Never used  Substance and Sexual Activity   Alcohol use: Not Currently    Comment: used to drink multiple cases of beer daily, quit 2018   Drug use: Never   Sexual activity:  Not Currently  Other Topics Concern   Not on file  Social History Narrative   Patient lives at home with wife and some of his 10 children. He self-administers his own medications.   Social Determinants of Health   Financial Resource Strain: Low Risk  (09/22/2021)   Overall Financial Resource Strain (CARDIA)    Difficulty of Paying Living Expenses: Not hard at all  Food Insecurity: No Food Insecurity (09/22/2021)   Hunger Vital Sign    Worried About Running Out of Food in the Last Year: Never true    Ran Out of Food in the Last Year: Never true  Transportation Needs: Unmet Transportation Needs (09/22/2021)   PRAPARE - Administrator, Civil Service (Medical): Yes    Lack of Transportation (Non-Medical): Yes  Physical Activity: Not on file  Stress: Not on file  Social Connections: Not on file  Intimate Partner Violence: Not on file    Physical Exam      Future Appointments  Date Time Provider Department Center  05/29/2022 10:10 AM [facility] OP [facility]  05/29/2022 11:40 AM Laurey Morale, MD [facility] None  05/30/2022  7:20 AM [facility] DEVICE REMOTES [facility]  08/29/2022  7:20 AM [facility] DEVICE REMOTES [facility]  11/28/2022  7:20 AM [facility] DEVICE REMOTES [facility]  02/27/2023  7:20 AM [facility] DEVICE REMOTES [facility]       Beatrix Shipper,  EMT-Paramedic [phone] [facility] Paramedic  05/09/22

## 2022-05-23 ENCOUNTER — Other Ambulatory Visit (HOSPITAL_COMMUNITY): Payer: Self-pay | Admitting: Emergency Medicine

## 2022-05-23 NOTE — Progress Notes (Signed)
Paramedicine Encounter    Patient ID: Richard [name], male    DOB: November 24, 1960, 61 y.o.   MRN: [id number]   BP 108/74 (BP Location: Right Arm, Patient Position: Sitting, Cuff Size: Normal)   Pulse 70   Resp 16   Wt 228 lb 3.2 oz (103.5 kg)   SpO2 97%   BMI 27.06 kg/m  Weight yesterday-not taken Last visit weight-232.8lb CBG 166  ATF Mr Cassada A&O  x4, skin W&D w/ good color.  Pt. Denies chest pain or SOB. Lung sounds clear and equal bilat.   No edema noted.  He says he likes doing the meds from bubble packs and looks to be much more compliant than he was with his pill box.   Mr. Meldrum does appear depressed and stressed by his living situation.  He says he is actively looking for another place to live.  He also has received a Dexcom 7 sensor but does not have a reader and doesn't have a smart phone to download the Dexcom app.  I gave him the phone number for Tech Support for the [facility] and he advised he will call same.  He has been doing finger sticks for his sugar and CBG reading is 166.  Home visit complete.    Beatrix Shipper, EMT-Paramedic [phone] 05/23/2022    Patient Care Team: Olevia Perches as PCP - General (Internal Medicine) Laurey Morale, MD as PCP - Advanced Heart Failure (Cardiology) Duke Salvia, MD as PCP - Electrophysiology (Cardiology) Burna Sis, LCSW as Social Worker (Licensed Clinical Social Worker)  Patient Active Problem List   Diagnosis Date Noted   Hyperlipidemia 09/09/2021   Wound dehiscence 03/02/2021   Subacute osteomyelitis, right ankle and foot (HCC)    Sleep disturbance    Essential hypertension    Diabetic peripheral neuropathy (HCC)    Labile blood glucose    Left below-knee amputee (HCC) 02/07/2021   PAD (peripheral artery disease) (HCC) 02/01/2021   Below-knee amputation of left lower extremity (HCC) 01/27/2021   S/P BKA (below knee amputation), left (HCC)    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HCC)    SOB (shortness of  breath)    Open wound    Bacteremia    Chronic ulcer of left ankle (HCC)    Severe sepsis without septic shock (HCC)    COPD exacerbation (HCC) 01/15/2021   Chronic systolic CHF (congestive heart failure) (HCC) 01/15/2021   Acute on chronic respiratory failure with hypoxia and hypercapnia (HCC) 01/15/2021   Acute renal failure superimposed on stage 3 chronic kidney disease (HCC) 01/15/2021   Elevated troponin 01/15/2021   Hyponatremia 01/15/2021   CHF exacerbation (HCC) 10/17/2020   Acute on chronic systolic CHF (congestive heart failure) (HCC) 10/16/2020   Acute on chronic combined systolic and diastolic CHF (congestive heart failure) (HCC) 03/15/2018   S/P right and left heart catheterization 11/02/2017   Panic attacks 11/02/2017   Non-STEMI (non-ST elevated myocardial infarction) (HCC)    COPD (chronic obstructive pulmonary disease) (HCC)    Hypertension    CKD (chronic kidney disease)    Single hamartoma of lung (HCC)    Presence of implantable cardioverter-defibrillator (ICD)    GERD (gastroesophageal reflux disease)    Diabetes mellitus with diabetic neuropathy, with long-term current use of insulin (HCC)     Current Outpatient Medications:    albuterol (VENTOLIN HFA) 108 (90 Base) MCG/ACT inhaler, Inhale 1-2 puffs into the lungs every 4 (four) hours as needed for wheezing or  shortness of breath. (Patient not taking: Reported on 05/09/2022), Disp: 8 g, Rfl: 1   allopurinol (ZYLOPRIM) 100 MG tablet, Take 1 tablet (100 mg total) by mouth daily., Disp: 30 tablet, Rfl: 3   aspirin EC 81 MG tablet, Take 1 tablet (81 mg total) by mouth daily. Swallow whole., Disp: 90 tablet, Rfl: 3   atorvastatin (LIPITOR) 40 MG tablet, TAKE ONE TABLET BY MOUTH DAILY, Disp: 90 tablet, Rfl: 3   bisoprolol (ZEBETA) 5 MG tablet, Take 0.5 tablets (2.5 mg total) by mouth daily., Disp: 45 tablet, Rfl: 3   clopidogrel (PLAVIX) 75 MG tablet, TAKE ONE TABLET BY MOUTH DAILY, Disp: 30 tablet, Rfl: 11   digoxin  (LANOXIN) 0.125 MG tablet, TAKE 1 TABLET (0.125 MG TOTAL) BY MOUTH DAILY., Disp: 60 tablet, Rfl: 6   docusate sodium (COLACE) 100 MG capsule, Take 1 capsule (100 mg total) by mouth daily., Disp: 10 capsule, Rfl: 0   DULoxetine (CYMBALTA) 60 MG capsule, Take 1 capsule (60 mg total) by mouth daily., Disp: 30 capsule, Rfl: 2   empagliflozin (JARDIANCE) 25 MG TABS tablet, Take 25 mg by mouth daily., Disp: , Rfl:    fenofibrate (TRICOR) 145 MG tablet, Take 145 mg by mouth daily., Disp: , Rfl:    Fluticasone Furoate-Vilanterol (BREO ELLIPTA IN), Inhale into the lungs daily as needed. (Patient not taking: Reported on 05/09/2022), Disp: , Rfl:    gabapentin (NEURONTIN) 400 MG capsule, Take 400 mg by mouth 3 (three) times daily., Disp: , Rfl:    HUMULIN R U-500 KWIKPEN 500 UNIT/ML KwikPen, Inject into the skin., Disp: , Rfl:    hydrALAZINE (APRESOLINE) 50 MG tablet, Take 1.5 tablets (75 mg total) by mouth 3 (three) times daily., Disp: 540 tablet, Rfl: 2   isosorbide mononitrate (IMDUR) 30 MG 24 hr tablet, Take 0.5 tablets (15 mg total) by mouth daily. Do not take if planning to use Viagra, Disp: 30 tablet, Rfl: 11   Magnesium Oxide 200 MG TABS, Take 2 tablets (400 mg total) by mouth daily., Disp: 60 tablet, Rfl: 11   sacubitril-valsartan (ENTRESTO) 24-26 MG, Take 1 tablet by mouth 2 (two) times daily., Disp: 60 tablet, Rfl: 6   sildenafil (VIAGRA) 50 MG tablet, TAKE 1 TABLET (50 MG TOTAL) BY MOUTH DAILY AS NEEDED FOR ERECTILE DYSFUNCTION., Disp: 10 tablet, Rfl: 0   spironolactone (ALDACTONE) 25 MG tablet, Take 1 tablet (25 mg total) by mouth daily., Disp: 90 tablet, Rfl: 3   torsemide (DEMADEX) 20 MG tablet, Take 2 tablets (40 mg total) by mouth daily., Disp: 30 tablet, Rfl: 6   zinc sulfate 220 (50 Zn) MG capsule, Take 1 capsule (220 mg total) by mouth daily., Disp: 30 capsule, Rfl: 0 Allergies  Allergen Reactions   Aspirin Itching   Codeine Hives   Coconut (Cocos Nucifera) Itching      Social  History   Socioeconomic History   Marital status: Divorced    Spouse name: Not on file   Number of children: Not on file   Years of education: Not on file   Highest education level: Not on file  Occupational History   Occupation: disability    Comment: stopped working in 2000 d/t occupational exposures  Tobacco Use   Smoking status: Former    Types: Cigarettes   Smokeless tobacco: Never   Tobacco comments:    "smoked when I drank"  Vaping Use   Vaping Use: Never used  Substance and Sexual Activity   Alcohol use: Not Currently  Comment: 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  Other Topics Concern   Not on file  Social History Narrative   Patient lives at home with wife and some of his 10 children. He self-administers his own medications.   Social Determinants of Health   Financial Resource Strain: Low Risk  (09/22/2021)   Overall Financial Resource Strain (CARDIA)    Difficulty of Paying Living Expenses: Not hard at all  Food Insecurity: No Food Insecurity (09/22/2021)   Hunger Vital Sign    Worried About Running Out of Food in the Last Year: Never true    Ran Out of Food in the Last Year: Never true  Transportation Needs: Unmet Transportation Needs (09/22/2021)   PRAPARE - Administrator, Civil Service (Medical): Yes    Lack of Transportation (Non-Medical): Yes  Physical Activity: Not on file  Stress: Not on file  Social Connections: Not on file  Intimate Partner Violence: Not on file    Physical Exam      Future Appointments  Date Time Provider Department Center  05/29/2022 10:10 AM [facility] ECHO/CH OP [facility]  05/29/2022 11:40 AM Laurey Morale, MD [facility] None  05/30/2022  7:20 AM [facility] DEVICE REMOTES [facility]  08/29/2022  7:20 AM [facility] DEVICE REMOTES [facility]  11/28/2022  7:20 AM [facility] DEVICE REMOTES [facility]   02/27/2023  7:20 AM [facility] DEVICE REMOTES [facility]       Beatrix Shipper, EMT-P-Paramedic [phone] [facility] Paramedic  05/23/22

## 2022-05-29 ENCOUNTER — Telehealth (HOSPITAL_COMMUNITY): Payer: Self-pay | Admitting: Licensed Clinical Social Worker

## 2022-05-29 ENCOUNTER — Ambulatory Visit (HOSPITAL_COMMUNITY)
Admission: RE | Admit: 2022-05-29 | Discharge: 2022-05-29 | Disposition: A | Discharge status: Home / Self Care | Payer: Medicare Other | Source: Ambulatory Visit | Attending: Cardiology | Admitting: Cardiology

## 2022-05-29 ENCOUNTER — Ambulatory Visit (HOSPITAL_BASED_OUTPATIENT_CLINIC_OR_DEPARTMENT_OTHER)
Admission: RE | Admit: 2022-05-29 | Discharge: 2022-05-29 | Disposition: A | Discharge status: Home / Self Care | Payer: Medicare Other | Source: Ambulatory Visit | Attending: Cardiology | Admitting: Cardiology

## 2022-05-29 ENCOUNTER — Encounter (HOSPITAL_COMMUNITY): Payer: Self-pay | Admitting: Cardiology

## 2022-05-29 VITALS — BP 130/60 | HR 89 | Wt 231.6 lb

## 2022-05-29 DIAGNOSIS — Z79899 Other long term (current) drug therapy: Secondary | ICD-10-CM | POA: Diagnosis not present

## 2022-05-29 DIAGNOSIS — E781 Pure hyperglyceridemia: Secondary | ICD-10-CM | POA: Diagnosis not present

## 2022-05-29 DIAGNOSIS — N183 Chronic kidney disease, stage 3 unspecified: Secondary | ICD-10-CM | POA: Diagnosis not present

## 2022-05-29 DIAGNOSIS — I739 Peripheral vascular disease, unspecified: Secondary | ICD-10-CM | POA: Insufficient documentation

## 2022-05-29 DIAGNOSIS — I110 Hypertensive heart disease with heart failure: Secondary | ICD-10-CM | POA: Insufficient documentation

## 2022-05-29 DIAGNOSIS — I5022 Chronic systolic (congestive) heart failure: Secondary | ICD-10-CM

## 2022-05-29 DIAGNOSIS — D751 Secondary polycythemia: Secondary | ICD-10-CM | POA: Diagnosis not present

## 2022-05-29 DIAGNOSIS — M109 Gout, unspecified: Secondary | ICD-10-CM | POA: Insufficient documentation

## 2022-05-29 DIAGNOSIS — Z7902 Long term (current) use of antithrombotics/antiplatelets: Secondary | ICD-10-CM | POA: Diagnosis not present

## 2022-05-29 DIAGNOSIS — J449 Chronic obstructive pulmonary disease, unspecified: Secondary | ICD-10-CM | POA: Diagnosis not present

## 2022-05-29 DIAGNOSIS — E1122 Type 2 diabetes mellitus with diabetic chronic kidney disease: Secondary | ICD-10-CM | POA: Insufficient documentation

## 2022-05-29 DIAGNOSIS — Z95810 Presence of automatic (implantable) cardiac defibrillator: Secondary | ICD-10-CM | POA: Insufficient documentation

## 2022-05-29 DIAGNOSIS — K219 Gastro-esophageal reflux disease without esophagitis: Secondary | ICD-10-CM | POA: Diagnosis not present

## 2022-05-29 DIAGNOSIS — G4733 Obstructive sleep apnea (adult) (pediatric): Secondary | ICD-10-CM | POA: Insufficient documentation

## 2022-05-29 DIAGNOSIS — I428 Other cardiomyopathies: Secondary | ICD-10-CM | POA: Diagnosis not present

## 2022-05-29 DIAGNOSIS — Z89511 Acquired absence of right leg below knee: Secondary | ICD-10-CM | POA: Insufficient documentation

## 2022-05-29 DIAGNOSIS — Z955 Presence of coronary angioplasty implant and graft: Secondary | ICD-10-CM | POA: Insufficient documentation

## 2022-05-29 DIAGNOSIS — E785 Hyperlipidemia, unspecified: Secondary | ICD-10-CM | POA: Insufficient documentation

## 2022-05-29 DIAGNOSIS — I2510 Atherosclerotic heart disease of native coronary artery without angina pectoris: Secondary | ICD-10-CM | POA: Diagnosis not present

## 2022-05-29 DIAGNOSIS — Z89512 Acquired absence of left leg below knee: Secondary | ICD-10-CM | POA: Diagnosis not present

## 2022-05-29 LAB — BASIC METABOLIC PANEL
Anion gap: 10 (ref 5–15)
BUN: 20 mg/dL (ref 6–20)
CO2: 21 mmol/L — ABNORMAL LOW (ref 22–32)
Calcium: 9.1 mg/dL (ref 8.9–10.3)
Chloride: 107 mmol/L (ref 98–111)
Creatinine, Ser: 1.11 mg/dL (ref 0.61–1.24)
GFR, Estimated: >60 mL/min (ref >60)
Glucose, Bld: 204 mg/dL — ABNORMAL HIGH (ref 70–99)
Potassium: 4.5 mmol/L (ref 3.5–5.1)
Sodium: 138 mmol/L (ref 135–145)

## 2022-05-29 LAB — DIGOXIN LEVEL: Digoxin Level: <0.2 ng/mL — ABNORMAL LOW (ref 0.8–2.0)

## 2022-05-29 LAB — ECHOCARDIOGRAM COMPLETE: S' Lateral: 4.5 cm

## 2022-05-29 MED ORDER — TORSEMIDE 20 MG PO TABS: 20.0000 mg | ORAL_TABLET | Freq: Every day | ORAL | 3 refills | Status: DC

## 2022-05-29 MED ORDER — ENTRESTO 49-51 MG PO TABS: 1.0000 | ORAL_TABLET | Freq: Two times a day (BID) | ORAL | 11 refills | Status: DC

## 2022-05-29 NOTE — Patient Instructions (Signed)
STOP Asprin  INCREASE Entresto 49/51 mg Twice daily  DECREASE Torsemide to 20 mg daily.  Labs done today, your results will be available in MyChart, we will contact you for abnormal readings.  Repeat blood work in 10 days  Your physician recommends that you schedule a follow-up appointment in: 6 weeks   If you have any questions or concerns before your next appointment please send [place] a message through [place] or call our office at [phone].    TO LEAVE A MESSAGE FOR THE NURSE SELECT OPTION 2, PLEASE LEAVE A MESSAGE INCLUDING: YOUR NAME DATE OF BIRTH CALL BACK NUMBER REASON FOR CALL**this is important as we prioritize the call backs  YOU WILL RECEIVE A CALL BACK THE SAME DAY AS LONG AS YOU CALL BEFORE 4:00 PM  At the [facility], you and your health needs are our priority. As part of our continuing mission to provide you with exceptional heart care, we have created designated Provider Care Teams. These Care Teams include your primary Cardiologist (physician) and Advanced Practice Providers (APPs- Physician Assistants and Nurse Practitioners) who all work together to provide you with the care you need, when you need it.   You may see any of the following providers on your designated Care Team at your next follow up: Dr Arvilla Meres Dr Marca Ancona Dr. Marcos Eke, NP Robbie Lis, [place] [facility] [place], [place] Brynda Peon, NP Karle Plumber, PharmD   Please be sure to bring in all your medications bottles to every appointment.

## 2022-05-29 NOTE — Telephone Encounter (Signed)
H&V Care Navigation CSW Progress Note  Clinical Social Worker consulted to help with transport to appt today- CSW able to arrange uber ride through [place].   SDOH Screenings   Food Insecurity: No Food Insecurity (09/22/2021)  Housing: Low Risk  (04/28/2022)  Transportation Needs: Unmet Transportation Needs (05/29/2022)  Financial Resource Strain: Low Risk  (09/22/2021)  Tobacco Use: Medium Risk (04/19/2022)    Burna Sis, LCSW Clinical Social Worker [facility] Desk#: [phone] Cell#: [phone]

## 2022-05-29 NOTE — Progress Notes (Signed)
Advanced [facility] Clinic Note   ID:  Richard [name], Richard [name] 1961-06-18, MRN [id number]   Provider location: [place] [facility] Type of Visit: Established patient   PCP:  [name] Catalina, PA-C HF Cardiologist:  Dr. Shirlee Latch   HPI Richard [name] is a 61 y.o. male who has a history of chronic systolic HF s/p Medtronic ICD (12/2016), HTN, DM, and HLD.   Admitted 5/8-5/14/19 with A/C systolic HF. Echo showed EF 15-20%. Advanced HF team was consulted. He diuresed 20 lbs with IV lasix, then transitioned to torsemide 40 mg daily. Underwent R/LHC showing a focal severe RCA stenosis treated with DES.  He did not, however, appear to have severe enough coronary disease to explain his cardiomyopathy. Cardiac index was low at 1.9.  HF meds were optimized. Beta blocker was not started due to low CI and soft BPs. He was discharged on home oxygen. Referred to cardiac rehab. DC weight: 260 lbs.   Echo in 2/20 showed EF 20-25% with severe LV dilation, moderate RV enlargement with moderately decreased RV systolic function.  CPX in 2/21 showed a severe functional limitation but it actually appeared to be primarily due to pulmonary issues.   Echo in 1/22 showed EF < 20% with severe LV dilation, normal RV size with moderately decreased systolic function, dilated IVC.   He was admitted in 4/22 with CHF.   TEE in 7/22 showed EF 20% with severe LV dilation, moderate RV enlargement with moderately decreased RV systolic function.   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  Echo was done today and reviewed, EF 20-25%, mild LV dilation, mildly decreased RV systolic function, IVC normal.   Today he returns for HF follow up. Weight down 1 lb. Gets fatigued easily.  No dyspnea walking on flat ground generally.  No chest pain.  No orthopnea/PND.  No lightheadedness.   ECG (personally reviewed): NSR, 1st degree AVB, LVH with QRS 132 msec  Medtronic device  interrogation (personally reviewed): Stable thoracic impedance, no AF/VT   Labs (10/19): K 3.9, creatinine 1.18  Labs (10/20): BNP 160, K 3.8, creatinine 1.31 Labs (4/21): TGs 475, LDL 67, K 4.2, creatinine 1.8, urine and serum immunofixations negative.  Labs (11/21): LDL 42, HDL 27, K 4.3, creatinine 0.86 Labs (5/22): K 4.5, creatinine 1.46 Labs (11/19/20): SCr 2.0, 4.5  Labs (11/22): K 4.6, creatinine 1.15 Labs (3/22): K 4.3, creatinine 1.46. LDL 42, TG 68  Labs (5/23): K 4.5, creatinine 1.5 Labs (6/23): K 4.4, creatinine 1.47 Labs (9/23): K 4.1, creatinine 1.75, normal LFTs, LDL 67, HDL 27 Labs (10/23): K 3.9, creatinine 1.36, digoxin 0.3  PMH: 1. Chronic systolic CHF: Primarily nonischemic cardiomyopathy.  Has Medtronic ICD.   - Echo 5/19: EF 15-20%, grade 2 DD, mild MR, RV severely reduced, LA moderately dilated, RA mildly dilated, mild TR.  - RHC/LHC (5/19): 80% distal LAD stenosis, 80% proximal RCA stenosis treated with DES to pRCA. Mean RA 13, PA 69/31 mean 47, mean 24, CI 1.91, PVR 4.8 WU.  - Gynecomastia with spironolactone. - Echo (2/20): EF 20-25%, severe LV enlargement, moderate RV enlargement, moderately decreased RV systolic function.  - CPX (2/21): peak VO2 13.2, VE/VCO2 slope 24, RER 1.10. Severe functional limitation due to OHS with severe restrictive lung disease without significant HF limitation.  - PYP scan (7/21): Negative for TTR cardiac amyloidosis.  - Echo (1/22): EF < 20% with severe LV dilation, normal RV size with moderately decreased  systolic function, dilated IVC.  - TEE (7/22): EF 20% with severe LV dilation, moderate RV enlargement with moderately decreased RV systolic function.  - Echo (12/23): EF 20-25%, mild LV dilation, mildly decreased RV systolic function, IVC normal.  2. COPD: Uses 2L home oxygen. Never smoked, but apparently had significant occupational exposure.  It appears that he won a lawsuit dealing with the occupational exposure-related COPD.   3. CAD: Cath in 5/17 with 60% RCA stenosis.  - LHC (5/19): 80% distal LAD, 80% proximal RCA.  DES to proximal RCA.  4. Polycythemia: Probably due to chronic hypoxemia.  5. Type II diabetes.  6. HTN 7. GERD 8. CKD: Stage 3.  Likely due to diabetes and HTN.  9. OSA: Unable to tolerate CPAP.  10. PAD: Right and left BKA 2022.  11. 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 Group B Strep bacteremia  Review of systems complete and found to be negative unless listed in HPI.    Current Outpatient Medications  Medication Sig Dispense Refill   albuterol (VENTOLIN HFA) 108 (90 Base) MCG/ACT inhaler Inhale 1-2 puffs into the lungs every 4 (four) hours as needed for wheezing or shortness of breath. 8 g 1   allopurinol (ZYLOPRIM) 100 MG tablet Take 1 tablet (100 mg total) by mouth daily. 30 tablet 3   atorvastatin (LIPITOR) 40 MG tablet TAKE ONE TABLET BY MOUTH DAILY 90 tablet 3   bisoprolol (ZEBETA) 5 MG tablet Take 0.5 tablets (2.5 mg total) by mouth daily. 45 tablet 3   clopidogrel (PLAVIX) 75 MG tablet TAKE ONE TABLET BY MOUTH DAILY 30 tablet 11   digoxin (LANOXIN) 0.125 MG tablet TAKE 1 TABLET (0.125 MG TOTAL) BY MOUTH DAILY. 60 tablet 6   docusate sodium (COLACE) 100 MG capsule Take 1 capsule (100 mg total) by mouth daily. 10 capsule 0   DULoxetine (CYMBALTA) 60 MG capsule Take 1 capsule (60 mg total) by mouth daily. 30 capsule 2   empagliflozin (JARDIANCE) 25 MG TABS tablet Take 25 mg by mouth daily.     fenofibrate (TRICOR) 145 MG tablet Take 145 mg by mouth daily.     Fluticasone Furoate-Vilanterol (BREO ELLIPTA IN) Inhale into the lungs daily as needed.     gabapentin (NEURONTIN) 400 MG capsule Take 400 mg by mouth as needed.     hydrALAZINE (APRESOLINE) 50 MG tablet Take 1.5 tablets (75 mg total) by mouth 3 (three) times daily. 540 tablet 2   insulin regular human CONCENTRATED (HUMULIN R) 500 UNIT/ML injection Inject 1 Units into the skin in the morning, at noon, and at bedtime. As  needed sliding scale     isosorbide mononitrate (IMDUR) 30 MG 24 hr tablet Take 0.5 tablets (15 mg total) by mouth daily. Do not take if planning to use Viagra 30 tablet 11   Magnesium Oxide 200 MG TABS Take 2 tablets (400 mg total) by mouth daily. 60 tablet 11   sacubitril-valsartan (ENTRESTO) 49-51 MG Take 1 tablet by mouth 2 (two) times daily. 60 tablet 11   sildenafil (VIAGRA) 50 MG tablet TAKE 1 TABLET (50 MG TOTAL) BY MOUTH DAILY AS NEEDED FOR ERECTILE DYSFUNCTION. 10 tablet 0   spironolactone (ALDACTONE) 25 MG tablet Take 1 tablet (25 mg total) by mouth daily. 90 tablet 3   zinc sulfate 220 (50 Zn) MG capsule Take 1 capsule (220 mg total) by mouth daily. 30 capsule 0   torsemide (DEMADEX) 20 MG tablet Take 1 tablet (20 mg total) by mouth daily.  90 tablet 3   No current facility-administered medications for this encounter.   Allergies  Allergen Reactions   Aspirin Itching   Codeine Hives   Coconut (Cocos Nucifera) Itching   Social History   Socioeconomic History   Marital status: Divorced    Spouse name: Not on file   Number of children: Not on file   Years of education: Not on file   Highest education level: Not on file  Occupational History   Occupation: disability    Comment: stopped working in 2000 d/t occupational exposures  Tobacco Use   Smoking status: Former    Types: Cigarettes   Smokeless tobacco: Never   Tobacco comments:    "smoked when I drank"  Vaping Use   Vaping Use: Never used  Substance and Sexual Activity   Alcohol use: Not Currently    Comment: 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  Other Topics Concern   Not on file  Social History Narrative   Patient lives at home with wife and some of his 10 children. He self-administers his own medications.   Social Determinants of Health   Financial Resource Strain: Low Risk  (09/22/2021)   Overall Financial Resource Strain (CARDIA)     Difficulty of Paying Living Expenses: Not hard at all  Food Insecurity: No Food Insecurity (09/22/2021)   Hunger Vital Sign    Worried About Running Out of Food in the Last Year: Never true    Ran Out of Food in the Last Year: Never true  Transportation Needs: Unmet Transportation Needs (05/29/2022)   PRAPARE - Administrator, Civil Service (Medical): Yes    Lack of Transportation (Non-Medical): Yes  Physical Activity: Not on file  Stress: Not on file  Social Connections: Not on file  Intimate Partner Violence: Not on file   Family History  Problem Relation Age of Onset   Hypertension Mother    Diabetes Mother    Hypertension Father    Diabetes Father    Diabetes Sister    Diabetes Brother    BP 130/60   Pulse 89   Wt 105.1 kg (231 lb 9.6 oz)   SpO2 95%   BMI 27.46 kg/m   Wt Readings from Last 3 Encounters:  05/29/22 105.1 kg (231 lb 9.6 oz)  05/23/22 103.5 kg (228 lb 3.2 oz)  05/09/22 105.5 kg (232 lb 8 oz)   PHYSICAL EXAM: General: NAD Neck: No JVD, no thyromegaly or thyroid nodule.  Lungs: Clear to auscultation bilaterally with normal respiratory effort. CV: Nondisplaced PMI.  Heart regular S1/S2, no S3/S4, no murmur.  No peripheral edema.  No carotid bruit.    Abdomen: Soft, nontender, no hepatosplenomegaly, no distention.  Skin: Intact without lesions or rashes.  Neurologic: Alert and oriented x 3.  Psych: Normal affect. Extremities: Bilateral BKAs HEENT: Normal.   ASSESSMENT & PLAN: 1. Chronic systolic CHF:  Primarily nonischemic cardiomyopathy.  Echo in 2017 with EF 15-20%.  Medtronic ICD. Echo 10/2017 EF 15-20%, severely dilated RV with severely decreased systolic function.  LHC/RHC 11/06/17 showed volume overload with 80% RCA stenosis.  Cardiac index low at 1.91. The degree of coronary disease does not explain his cardiomyopathy.  Echo in 2/20 showed severe LV dilation, EF 20-25%, moderately decreased RV systolic function.  PYP scan in 7/21 not  suggestive of transthyretin amyloidosis. Echo in 1/22 showed EF < 20%, moderately decreased RV systolic  function.  TEE in 7/22 with EF 20% with severe LV dilation, moderate RV enlargement with moderately decreased RV systolic function. Echo today showed EF 20-25%, mild LV dilation, mildly decreased RV systolic function, IVC normal.  NYHA class II symptoms, he is not volume overloaded by Optivol or exam.  - Continue Imdur 15 mg daily + hydralazine 75 mg tid.  - Decrease torsemide to 20 mg daily.  - Increase Entresto to 49/51 bid.  BMET/BNP today and BMET in 10 days.  - Continue empaglifozin.  - Continue spironolactone 25 mg daily.  - Continue digoxin 0.125 mg daily, check level.  - Continue bisoprolol 2.5 mg daily, will need to increase this medication in the future.  - Continue Paramedicine.  2. COPD: Never smoked, but apparently had significant occupational exposure.  It appears that he won a lawsuit dealing with the occupational exposure-related COPD. CPX in 2/21 showed severe functional limitation, but it appeared to be due to pulmonary abnormalities rather than HF. He was on home oxygen in the past but is not using it now.   - Followup with pulmonary.  3. CAD:  LHC in 5/19 with 80-90% proximal RCA stenosis treated with DES to RCA.  This did not cause his cardiomyopathy but is a large RCA. No chest pain.  - At this point, he can stop ASA and continue single agent Plavix.  - Continue atorvastatin, good lipids in 9/23.  4. Polycythemia: Likely related to chronic hypoxia.  5. OSA: Cannot tolerate CPAP, wears oxygen at night.  6. DM: He is on empagliflozin.   7. Hypertriglyceridemia: Off Vascepa due to blood in stool that resolved.  - Continue fenofibrate 145 mg daily.   Follow up in 6 wks with APP for medication titration.     Signed, Marca Ancona, MD  05/29/2022  [facility] [address] [facility] [place] [address] [phone]  (office) [phone] (fax)

## 2022-05-30 ENCOUNTER — Ambulatory Visit (INDEPENDENT_AMBULATORY_CARE_PROVIDER_SITE_OTHER): Payer: Medicare Other

## 2022-05-30 DIAGNOSIS — I5043 Acute on chronic combined systolic (congestive) and diastolic (congestive) heart failure: Secondary | ICD-10-CM | POA: Diagnosis not present

## 2022-05-30 LAB — CUP PACEART REMOTE DEVICE CHECK
Battery Remaining Longevity: 49 mo
Battery Voltage: 2.97 V
Brady Statistic AP VP Percent: 0 %
Brady Statistic AP VS Percent: 0 %
Brady Statistic AS VP Percent: 0.04 %
Brady Statistic AS VS Percent: 99.96 %
Brady Statistic RA Percent Paced: 0 %
Brady Statistic RV Percent Paced: 0.04 %
Date Time Interrogation Session: 20231205012203
HighPow Impedance: 75 Ohm
Implantable Lead Connection Status: 753985
Implantable Lead Connection Status: 753985
Implantable Lead Implant Date: 20180719
Implantable Lead Implant Date: 20180719
Implantable Lead Location: 753859
Implantable Lead Location: 753860
Implantable Lead Model: 5076
Implantable Pulse Generator Implant Date: 20180719
Lead Channel Impedance Value: 342 Ohm
Lead Channel Impedance Value: 418 Ohm
Lead Channel Impedance Value: 418 Ohm
Lead Channel Pacing Threshold Amplitude: 0.875 V
Lead Channel Pacing Threshold Amplitude: 1 V
Lead Channel Pacing Threshold Pulse Width: 0.4 ms
Lead Channel Pacing Threshold Pulse Width: 0.4 ms
Lead Channel Sensing Intrinsic Amplitude: 3.375 mV
Lead Channel Sensing Intrinsic Amplitude: 3.375 mV
Lead Channel Sensing Intrinsic Amplitude: 6.75 mV
Lead Channel Sensing Intrinsic Amplitude: 6.75 mV
Lead Channel Setting Pacing Amplitude: 2 V
Lead Channel Setting Pacing Amplitude: 2 V
Lead Channel Setting Pacing Pulse Width: 0.4 ms
Lead Channel Setting Sensing Sensitivity: 0.3 mV
Zone Setting Status: 755011
Zone Setting Status: 755011

## 2022-06-06 ENCOUNTER — Other Ambulatory Visit (HOSPITAL_COMMUNITY): Payer: Self-pay | Admitting: Emergency Medicine

## 2022-06-06 NOTE — Progress Notes (Signed)
Paramedicine Encounter    Patient ID: Richard [name], male    DOB: 02/05/1961, 61 y.o.   MRN: [id number]   BP (!) 140/100 (BP Location: Right Arm, Patient Position: Sitting, Cuff Size: Normal)   Pulse 67   Resp 16   SpO2 95%  Weight yesterday-not taken Last visit weight-228lb  ATF Mr. Ream A&O x 4, skin W&D w/ good color.  Pt. Is being compliant with his meds in bubble packs.  He denies chest pain or SOB.  Lung sounds clear and equal bilat.  No obvious signs of edema.  Could not get a weight today because he didn't want to put his prosthetic legs on.   Text Eileen Stanford for assistance with [facility]  Historically, his wife helped him with setting up transportation and she does nothing for him.  He is still has not found an place to move yet. Home visit complete.    Beatrix Shipper, EMT-Paramedic [phone] 06/06/2022  Patient Care Team: Olevia Perches as PCP - General (Internal Medicine) Laurey Morale, MD as PCP - Advanced Heart Failure (Cardiology) Duke Salvia, MD as PCP - Electrophysiology (Cardiology) Burna Sis, LCSW as Social Worker (Licensed Clinical Social Worker)  Patient Active Problem List   Diagnosis Date Noted   Hyperlipidemia 09/09/2021   Wound dehiscence 03/02/2021   Subacute osteomyelitis, right ankle and foot (HCC)    Sleep disturbance    Essential hypertension    Diabetic peripheral neuropathy (HCC)    Labile blood glucose    Left below-knee amputee (HCC) 02/07/2021   PAD (peripheral artery disease) (HCC) 02/01/2021   Below-knee amputation of left lower extremity (HCC) 01/27/2021   S/P BKA (below knee amputation), left (HCC)    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HCC)    SOB (shortness of breath)    Open wound    Bacteremia    Chronic ulcer of left ankle (HCC)    Severe sepsis without septic shock (HCC)    COPD exacerbation (HCC) 01/15/2021   Chronic systolic CHF (congestive heart failure) (HCC) 01/15/2021    Acute on chronic respiratory failure with hypoxia and hypercapnia (HCC) 01/15/2021   Acute renal failure superimposed on stage 3 chronic kidney disease (HCC) 01/15/2021   Elevated troponin 01/15/2021   Hyponatremia 01/15/2021   CHF exacerbation (HCC) 10/17/2020   Acute on chronic systolic CHF (congestive heart failure) (HCC) 10/16/2020   Acute on chronic combined systolic and diastolic CHF (congestive heart failure) (HCC) 03/15/2018   S/P right and left heart catheterization 11/02/2017   Panic attacks 11/02/2017   Non-STEMI (non-ST elevated myocardial infarction) (HCC)    COPD (chronic obstructive pulmonary disease) (HCC)    Hypertension    CKD (chronic kidney disease)    Single hamartoma of lung (HCC)    Presence of implantable cardioverter-defibrillator (ICD)    GERD (gastroesophageal reflux disease)    Diabetes mellitus with diabetic neuropathy, with long-term current use of insulin (HCC)     Current Outpatient Medications:    albuterol (VENTOLIN HFA) 108 (90 Base) MCG/ACT inhaler, Inhale 1-2 puffs into the lungs every 4 (four) hours as needed for wheezing or shortness of breath., Disp: 8 g, Rfl: 1   allopurinol (ZYLOPRIM) 100 MG tablet, Take 1 tablet (100 mg total) by mouth daily., Disp: 30 tablet, Rfl: 3   atorvastatin (LIPITOR) 40 MG tablet, TAKE ONE TABLET BY MOUTH DAILY, Disp: 90 tablet, Rfl: 3   bisoprolol (ZEBETA) 5 MG tablet, Take 0.5  tablets (2.5 mg total) by mouth daily., Disp: 45 tablet, Rfl: 3   clopidogrel (PLAVIX) 75 MG tablet, TAKE ONE TABLET BY MOUTH DAILY, Disp: 30 tablet, Rfl: 11   digoxin (LANOXIN) 0.125 MG tablet, TAKE 1 TABLET (0.125 MG TOTAL) BY MOUTH DAILY., Disp: 60 tablet, Rfl: 6   docusate sodium (COLACE) 100 MG capsule, Take 1 capsule (100 mg total) by mouth daily., Disp: 10 capsule, Rfl: 0   DULoxetine (CYMBALTA) 60 MG capsule, Take 1 capsule (60 mg total) by mouth daily., Disp: 30 capsule, Rfl: 2   empagliflozin (JARDIANCE) 25 MG TABS tablet, Take 25 mg by  mouth daily., Disp: , Rfl:    fenofibrate (TRICOR) 145 MG tablet, Take 145 mg by mouth daily., Disp: , Rfl:    Fluticasone Furoate-Vilanterol (BREO ELLIPTA IN), Inhale into the lungs daily as needed., Disp: , Rfl:    gabapentin (NEURONTIN) 400 MG capsule, Take 400 mg by mouth as needed., Disp: , Rfl:    hydrALAZINE (APRESOLINE) 50 MG tablet, Take 1.5 tablets (75 mg total) by mouth 3 (three) times daily., Disp: 540 tablet, Rfl: 2   insulin regular human CONCENTRATED (HUMULIN R) 500 UNIT/ML injection, Inject 1 Units into the skin in the morning, at noon, and at bedtime. As needed sliding scale, Disp: , Rfl:    isosorbide mononitrate (IMDUR) 30 MG 24 hr tablet, Take 0.5 tablets (15 mg total) by mouth daily. Do not take if planning to use Viagra, Disp: 30 tablet, Rfl: 11   Magnesium Oxide 200 MG TABS, Take 2 tablets (400 mg total) by mouth daily., Disp: 60 tablet, Rfl: 11   sacubitril-valsartan (ENTRESTO) 49-51 MG, Take 1 tablet by mouth 2 (two) times daily., Disp: 60 tablet, Rfl: 11   sildenafil (VIAGRA) 50 MG tablet, TAKE 1 TABLET (50 MG TOTAL) BY MOUTH DAILY AS NEEDED FOR ERECTILE DYSFUNCTION., Disp: 10 tablet, Rfl: 0   spironolactone (ALDACTONE) 25 MG tablet, Take 1 tablet (25 mg total) by mouth daily., Disp: 90 tablet, Rfl: 3   torsemide (DEMADEX) 20 MG tablet, Take 1 tablet (20 mg total) by mouth daily., Disp: 90 tablet, Rfl: 3   zinc sulfate 220 (50 Zn) MG capsule, Take 1 capsule (220 mg total) by mouth daily., Disp: 30 capsule, Rfl: 0 Allergies  Allergen Reactions   Aspirin Itching   Codeine Hives   Coconut (Cocos Nucifera) Itching      Social History   Socioeconomic History   Marital status: Divorced    Spouse name: Not on file   Number of children: Not on file   Years of education: Not on file   Highest education level: Not on file  Occupational History   Occupation: disability    Comment: stopped working in 2000 d/t occupational exposures  Tobacco Use   Smoking status: Former     Types: Cigarettes   Smokeless tobacco: Never   Tobacco comments:    "smoked when I drank"  Vaping Use   Vaping Use: Never used  Substance and Sexual Activity   Alcohol use: Not Currently    Comment: 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  Other Topics Concern   Not on file  Social History Narrative   Patient lives at home with wife and some of his 10 children. He self-administers his own medications.   Social Determinants of Health   Financial Resource Strain: Low Risk  (09/22/2021)   Overall Financial Resource Strain (CARDIA)  Difficulty of Paying Living Expenses: Not hard at all  Food Insecurity: No Food Insecurity (09/22/2021)   Hunger Vital Sign    Worried About Running Out of Food in the Last Year: Never true    Ran Out of Food in the Last Year: Never true  Transportation Needs: Unmet Transportation Needs (05/29/2022)   PRAPARE - Administrator, Civil Service (Medical): Yes    Lack of Transportation (Non-Medical): Yes  Physical Activity: Not on file  Stress: Not on file  Social Connections: Not on file  Intimate Partner Violence: Not on file    Physical Exam      Future Appointments  Date Time Provider Department Center  06/08/2022 11:30 AM [facility] LAB [facility] None  07/10/2022  3:20 PM Laurey Morale, MD [facility] None  08/29/2022  7:20 AM [facility] DEVICE REMOTES [facility]  11/28/2022  7:20 AM [facility] DEVICE REMOTES [facility]  02/27/2023  7:20 AM [facility] DEVICE REMOTES [facility]       Beatrix Shipper, EMT-P-Paramedic [phone] [facility] Paramedic  06/06/22

## 2022-06-07 ENCOUNTER — Telehealth (HOSPITAL_COMMUNITY): Payer: Self-pay | Admitting: Licensed Clinical Social Worker

## 2022-06-07 NOTE — Telephone Encounter (Signed)
H&V Care Navigation CSW Progress Note  Clinical Social Worker informed by Paramedic that pt in need of transportation to tomorrows appt.  CSW able to arrange Benedetto Goad ride through [place] for pick up at 10:45am.    SDOH Screenings   Food Insecurity: No Food Insecurity (09/22/2021)  Housing: Low Risk  (04/28/2022)  Transportation Needs: Unmet Transportation Needs (06/07/2022)  Financial Resource Strain: Low Risk  (09/22/2021)  Tobacco Use: Medium Risk (05/29/2022)     Burna Sis, LCSW Clinical Social Worker [facility] Desk#: [phone] Cell#: [phone]

## 2022-06-08 ENCOUNTER — Ambulatory Visit (HOSPITAL_COMMUNITY)
Admission: RE | Admit: 2022-06-08 | Discharge: 2022-06-08 | Disposition: A | Discharge status: Home / Self Care | Payer: Medicare Other | Source: Ambulatory Visit | Attending: Cardiology | Admitting: Cardiology

## 2022-06-08 DIAGNOSIS — I5022 Chronic systolic (congestive) heart failure: Secondary | ICD-10-CM | POA: Insufficient documentation

## 2022-06-08 LAB — BASIC METABOLIC PANEL
Anion gap: 10 (ref 5–15)
BUN: 21 mg/dL — ABNORMAL HIGH (ref 6–20)
CO2: 29 mmol/L (ref 22–32)
Calcium: 9.2 mg/dL (ref 8.9–10.3)
Chloride: 99 mmol/L (ref 98–111)
Creatinine, Ser: 1.3 mg/dL — ABNORMAL HIGH (ref 0.61–1.24)
GFR, Estimated: >60 mL/min (ref >60)
Glucose, Bld: 277 mg/dL — ABNORMAL HIGH (ref 70–99)
Potassium: 4.3 mmol/L (ref 3.5–5.1)
Sodium: 138 mmol/L (ref 135–145)

## 2022-06-08 NOTE — Addendum Note (Signed)
Encounter addended by: Burna Sis, LCSW on: 06/08/2022 1:31 PM  Actions taken: Clinical Note Signed

## 2022-06-08 NOTE — Progress Notes (Signed)
CSW assisted pt in getting ride home.  Also provided pt with [employer] applications to fill out and return to [employer].  Burna Sis, LCSW Clinical Social Worker [facility] Desk#: [phone] Cell#: [phone]

## 2022-06-20 ENCOUNTER — Other Ambulatory Visit (HOSPITAL_COMMUNITY): Payer: Self-pay | Admitting: Emergency Medicine

## 2022-06-20 NOTE — Progress Notes (Signed)
Paramedicine Encounter    Patient ID: Richard [name], male    DOB: [birth year], 61 y.o.   MRN: [id number]   BP (!) 128/90 (BP Location: Left Arm, Patient Position: Sitting, Cuff Size: Normal)   Pulse 78   Resp 16   Wt 230 lb (104.3 kg)   SpO2 96%   BMI 27.27 kg/m  Weight yesterday-not taken Last visit weight-228lb  Cbg 185  ATF Mr. Firkus A&O x 4, skin W&D w/ good color.  Pt denies chest pain or SOB.  Lung sounds clear and equal bilat with no edema noted.  He has not taken his morning meds and missed several evening doses over the holiday weekend.  Reviewed bubble packs with him again and he states he's just had a lot going on personally and is still searching for a place to move.  No pending appointments until January 15th w/ Dr. Shirlee Latch.  Home visit complete.    Beatrix Shipper, EMT-Paramedic [phone] 06/20/2022   Patient Care Team: Olevia Perches as PCP - General (Internal Medicine) Laurey Morale, MD as PCP - Advanced Heart Failure (Cardiology) Duke Salvia, MD as PCP - Electrophysiology (Cardiology) Burna Sis, LCSW as Social Worker (Licensed Clinical Social Worker)  Patient Active Problem List   Diagnosis Date Noted   Hyperlipidemia 09/09/2021   Wound dehiscence 03/02/2021   Subacute osteomyelitis, right ankle and foot (HCC)    Sleep disturbance    Essential hypertension    Diabetic peripheral neuropathy (HCC)    Labile blood glucose    Left below-knee amputee (HCC) 02/07/2021   PAD (peripheral artery disease) (HCC) 02/01/2021   Below-knee amputation of left lower extremity (HCC) 01/27/2021   S/P BKA (below knee amputation), left (HCC)    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HCC)    SOB (shortness of breath)    Open wound    Bacteremia    Chronic ulcer of left ankle (HCC)    Severe sepsis without septic shock (HCC)    COPD exacerbation (HCC) 01/15/2021   Chronic systolic CHF (congestive heart failure) (HCC) 01/15/2021   Acute on chronic respiratory  failure with hypoxia and hypercapnia (HCC) 01/15/2021   Acute renal failure superimposed on stage 3 chronic kidney disease (HCC) 01/15/2021   Elevated troponin 01/15/2021   Hyponatremia 01/15/2021   CHF exacerbation (HCC) 10/17/2020   Acute on chronic systolic CHF (congestive heart failure) (HCC) 10/16/2020   Acute on chronic combined systolic and diastolic CHF (congestive heart failure) (HCC) 03/15/2018   S/P right and left heart catheterization 11/02/2017   Panic attacks 11/02/2017   Non-STEMI (non-ST elevated myocardial infarction) (HCC)    COPD (chronic obstructive pulmonary disease) (HCC)    Hypertension    CKD (chronic kidney disease)    Single hamartoma of lung (HCC)    Presence of implantable cardioverter-defibrillator (ICD)    GERD (gastroesophageal reflux disease)    Diabetes mellitus with diabetic neuropathy, with long-term current use of insulin (HCC)     Current Outpatient Medications:    albuterol (VENTOLIN HFA) 108 (90 Base) MCG/ACT inhaler, Inhale 1-2 puffs into the lungs every 4 (four) hours as needed for wheezing or shortness of breath., Disp: 8 g, Rfl: 1   allopurinol (ZYLOPRIM) 100 MG tablet, Take 1 tablet (100 mg total) by mouth daily., Disp: 30 tablet, Rfl: 3   atorvastatin (LIPITOR) 40 MG tablet, TAKE ONE TABLET BY MOUTH DAILY, Disp: 90 tablet, Rfl: 3   bisoprolol (ZEBETA) 5 MG tablet, Take 0.5 tablets (2.5  mg total) by mouth daily., Disp: 45 tablet, Rfl: 3   clopidogrel (PLAVIX) 75 MG tablet, TAKE ONE TABLET BY MOUTH DAILY, Disp: 30 tablet, Rfl: 11   digoxin (LANOXIN) 0.125 MG tablet, TAKE 1 TABLET (0.125 MG TOTAL) BY MOUTH DAILY., Disp: 60 tablet, Rfl: 6   docusate sodium (COLACE) 100 MG capsule, Take 1 capsule (100 mg total) by mouth daily., Disp: 10 capsule, Rfl: 0   DULoxetine (CYMBALTA) 60 MG capsule, Take 1 capsule (60 mg total) by mouth daily., Disp: 30 capsule, Rfl: 2   empagliflozin (JARDIANCE) 25 MG TABS tablet, Take 25 mg by mouth daily., Disp: , Rfl:     fenofibrate (TRICOR) 145 MG tablet, Take 145 mg by mouth daily., Disp: , Rfl:    Fluticasone Furoate-Vilanterol (BREO ELLIPTA IN), Inhale into the lungs daily as needed., Disp: , Rfl:    gabapentin (NEURONTIN) 400 MG capsule, Take 400 mg by mouth as needed., Disp: , Rfl:    hydrALAZINE (APRESOLINE) 50 MG tablet, Take 1.5 tablets (75 mg total) by mouth 3 (three) times daily., Disp: 540 tablet, Rfl: 2   insulin regular human CONCENTRATED (HUMULIN R) 500 UNIT/ML injection, Inject 1 Units into the skin in the morning, at noon, and at bedtime. As needed sliding scale, Disp: , Rfl:    isosorbide mononitrate (IMDUR) 30 MG 24 hr tablet, Take 0.5 tablets (15 mg total) by mouth daily. Do not take if planning to use Viagra, Disp: 30 tablet, Rfl: 11   Magnesium Oxide 200 MG TABS, Take 2 tablets (400 mg total) by mouth daily., Disp: 60 tablet, Rfl: 11   sacubitril-valsartan (ENTRESTO) 49-51 MG, Take 1 tablet by mouth 2 (two) times daily., Disp: 60 tablet, Rfl: 11   sildenafil (VIAGRA) 50 MG tablet, TAKE 1 TABLET (50 MG TOTAL) BY MOUTH DAILY AS NEEDED FOR ERECTILE DYSFUNCTION., Disp: 10 tablet, Rfl: 0   spironolactone (ALDACTONE) 25 MG tablet, Take 1 tablet (25 mg total) by mouth daily., Disp: 90 tablet, Rfl: 3   torsemide (DEMADEX) 20 MG tablet, Take 1 tablet (20 mg total) by mouth daily., Disp: 90 tablet, Rfl: 3   zinc sulfate 220 (50 Zn) MG capsule, Take 1 capsule (220 mg total) by mouth daily., Disp: 30 capsule, Rfl: 0 Allergies  Allergen Reactions   Aspirin Itching   Codeine Hives   Coconut (Cocos Nucifera) Itching      Social History   Socioeconomic History   Marital status: Divorced    Spouse name: Not on file   Number of children: Not on file   Years of education: Not on file   Highest education level: Not on file  Occupational History   Occupation: disability    Comment: stopped working in 2000 d/t occupational exposures  Tobacco Use   Smoking status: Former    Types: Cigarettes    Smokeless tobacco: Never   Tobacco comments:    "smoked when I drank"  Vaping Use   Vaping Use: Never used  Substance and Sexual Activity   Alcohol use: Not Currently    Comment: 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  Other Topics Concern   Not on file  Social History Narrative   Patient lives at home with wife and some of his 10 children. He self-administers his own medications.   Social Determinants of Health   Financial Resource Strain: Low Risk  (09/22/2021)   Overall Financial Resource Strain (CARDIA)  Difficulty of Paying Living Expenses: Not hard at all  Food Insecurity: No Food Insecurity (09/22/2021)   Hunger Vital Sign    Worried About Running Out of Food in the Last Year: Never true    Ran Out of Food in the Last Year: Never true  Transportation Needs: Unmet Transportation Needs (06/07/2022)   PRAPARE - Administrator, Civil Service (Medical): Yes    Lack of Transportation (Non-Medical): Yes  Physical Activity: Not on file  Stress: Not on file  Social Connections: Not on file  Intimate Partner Violence: Not on file    Physical Exam      Future Appointments  Date Time Provider Department Center  07/10/2022  3:20 PM Laurey Morale, MD [facility] None  08/29/2022  7:20 AM [facility] DEVICE REMOTES [facility]  11/28/2022  7:20 AM [facility] DEVICE REMOTES [facility]  02/27/2023  7:20 AM [facility] DEVICE REMOTES [facility]       Jiles Prows [phone] [facility] Par[facility]  06/20/22

## 2022-06-27 NOTE — Progress Notes (Signed)
Remote ICD transmission.   

## 2022-06-29 ENCOUNTER — Other Ambulatory Visit (HOSPITAL_COMMUNITY): Payer: Self-pay | Admitting: Emergency Medicine

## 2022-06-29 NOTE — Progress Notes (Signed)
Paramedicine Encounter    Patient ID: Richard [name], male    DOB: 1961/03/23, 62 y.o.   MRN: [id number]   BP (!) 140/100 (BP Location: Left Arm, Patient Position: Sitting, Cuff Size: Normal)   Pulse 80   Resp 16   Wt 230 lb 3.2 oz (104.4 kg)   BMI 27.30 kg/m  Weight yesterday-not taken Last visit weight-230lb  ATF Mr. Evetts A&O x 4, skin W&D w/ good color.  Pt. Denies chest pain or SOB.  No edema noted.  Lung sounds clear and equal bilat.  He is doing well with his bubble packs and no med needs at this time.  Reviewed with him his upcoming appointment with Dr. Aundra Dubin on 1/15 and he asked me to give him the number to [employer] so that he can schedule his transport.  It's too early to schedule a ride yet so I have a reminder to call him on Tues 9th to remind him to call for his ride.  Also, Richard [name] states he is on a waiting list for an apartment and is "ready to move out and live on my own." Home visit complete.    [name], [place] 06/29/2022   Patient Care Team: Rip Harbour as PCP - General (Internal Medicine) Larey Dresser, MD as PCP - Advanced Heart Failure (Cardiology) Deboraha Sprang, MD as PCP - Electrophysiology (Cardiology) Jorge Ny, LCSW as Social Worker (Licensed Clinical Social Worker)  Patient Active Problem List   Diagnosis Date Noted   Hyperlipidemia 09/09/2021   Wound dehiscence 03/02/2021   Subacute osteomyelitis, right ankle and foot ([place])    Sleep disturbance    Essential hypertension    Diabetic peripheral neuropathy ([place])    Labile blood glucose    Left below-knee amputee ([place]) 02/07/2021   PAD (peripheral artery disease) ([place]) 02/01/2021   Below-knee amputation of left lower extremity ([place]) 01/27/2021   S/P BKA (below knee amputation), left (HCC)    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[place])    SOB (shortness of breath)    Open wound    Bacteremia    Chronic ulcer of left ankle (HCC)    Severe sepsis  without septic shock (HCC)    COPD exacerbation ([place]) 32/95/1884   Chronic systolic CHF (congestive heart failure) ([place]) 01/15/2021   Acute on chronic respiratory failure with hypoxia and hypercapnia ([place]) 01/15/2021   Acute renal failure superimposed on stage 3 chronic kidney disease ([place]) 01/15/2021   Elevated troponin 01/15/2021   Hyponatremia 01/15/2021   CHF exacerbation ([place]) 10/17/2020   Acute on chronic systolic CHF (congestive heart failure) ([place]) 10/16/2020   Acute on chronic combined systolic and diastolic CHF (congestive heart failure) ([place]) 03/15/2018   S/P right and left heart catheterization 11/02/2017   Panic attacks 11/02/2017   Non-STEMI (non-ST elevated myocardial infarction) ([place])    COPD (chronic obstructive pulmonary disease) ([place])    Hypertension    CKD (chronic kidney disease)    Single hamartoma of lung ([place])    Presence of implantable cardioverter-defibrillator (ICD)    GERD (gastroesophageal reflux disease)    Diabetes mellitus with diabetic neuropathy, with long-term current use of insulin ([place])     Current Outpatient Medications:    allopurinol (ZYLOPRIM) 100 MG tablet, Take 1 tablet (100 mg total) by mouth daily., Disp: 30 tablet, Rfl: 3   atorvastatin (LIPITOR) 40 MG tablet, TAKE ONE TABLET BY MOUTH DAILY, Disp: 90 tablet, Rfl: 3   bisoprolol (  ZEBETA) 5 MG tablet, Take 0.5 tablets (2.5 mg total) by mouth daily., Disp: 45 tablet, Rfl: 3   clopidogrel (PLAVIX) 75 MG tablet, TAKE ONE TABLET BY MOUTH DAILY, Disp: 30 tablet, Rfl: 11   digoxin (LANOXIN) 0.125 MG tablet, TAKE 1 TABLET (0.125 MG TOTAL) BY MOUTH DAILY., Disp: 60 tablet, Rfl: 6   docusate sodium (COLACE) 100 MG capsule, Take 1 capsule (100 mg total) by mouth daily., Disp: 10 capsule, Rfl: 0   DULoxetine (CYMBALTA) 60 MG capsule, Take 1 capsule (60 mg total) by mouth daily., Disp: 30 capsule, Rfl: 2   empagliflozin (JARDIANCE) 25 MG TABS tablet, Take 25 mg by mouth daily., Disp: , Rfl:     fenofibrate (TRICOR) 145 MG tablet, Take 145 mg by mouth daily., Disp: , Rfl:    gabapentin (NEURONTIN) 400 MG capsule, Take 400 mg by mouth as needed., Disp: , Rfl:    hydrALAZINE (APRESOLINE) 50 MG tablet, Take 1.5 tablets (75 mg total) by mouth 3 (three) times daily., Disp: 540 tablet, Rfl: 2   insulin regular human CONCENTRATED (HUMULIN R) 500 UNIT/ML injection, Inject 1 Units into the skin in the morning, at noon, and at bedtime. As needed sliding scale, Disp: , Rfl:    isosorbide mononitrate (IMDUR) 30 MG 24 hr tablet, Take 0.5 tablets (15 mg total) by mouth daily. Do not take if planning to use Viagra, Disp: 30 tablet, Rfl: 11   Magnesium Oxide 200 MG TABS, Take 2 tablets (400 mg total) by mouth daily., Disp: 60 tablet, Rfl: 11   sacubitril-valsartan (ENTRESTO) 49-51 MG, Take 1 tablet by mouth 2 (two) times daily., Disp: 60 tablet, Rfl: 11   spironolactone (ALDACTONE) 25 MG tablet, Take 1 tablet (25 mg total) by mouth daily., Disp: 90 tablet, Rfl: 3   torsemide (DEMADEX) 20 MG tablet, Take 1 tablet (20 mg total) by mouth daily., Disp: 90 tablet, Rfl: 3   zinc sulfate 220 (50 Zn) MG capsule, Take 1 capsule (220 mg total) by mouth daily., Disp: 30 capsule, Rfl: 0   albuterol (VENTOLIN HFA) 108 (90 Base) MCG/ACT inhaler, Inhale 1-2 puffs into the lungs every 4 (four) hours as needed for wheezing or shortness of breath. (Patient not taking: Reported on 06/29/2022), Disp: 8 g, Rfl: 1   Fluticasone Furoate-Vilanterol (BREO ELLIPTA IN), Inhale into the lungs daily as needed. (Patient not taking: Reported on 06/29/2022), Disp: , Rfl:    sildenafil (VIAGRA) 50 MG tablet, TAKE 1 TABLET (50 MG TOTAL) BY MOUTH DAILY AS NEEDED FOR ERECTILE DYSFUNCTION. (Patient not taking: Reported on 06/29/2022), Disp: 10 tablet, Rfl: 0 Allergies  Allergen Reactions   Aspirin Itching   Codeine Hives   Coconut (Cocos Nucifera) Itching      Social History   Socioeconomic History   Marital status: Divorced    Spouse  name: Not on file   Number of children: Not on file   Years of education: Not on file   Highest education level: Not on file  Occupational History   Occupation: disability    Comment: stopped working in 2000 d/t occupational exposures  Tobacco Use   Smoking status: Former    Types: Cigarettes   Smokeless tobacco: Never   Tobacco comments:    "smoked when I drank"  Vaping Use   Vaping Use: Never used  Substance and Sexual Activity   Alcohol use: Not Currently    Comment: used to drink multiple cases of beer daily, quit 2018   Drug use: Never   Sexual  activity: Not Currently  Other Topics Concern   Not on file  Social History Narrative   Patient lives at home with wife and some of his 10 children. He self-administers his own medications.   Social Determinants of Health   Financial Resource Strain: Low Risk  (09/22/2021)   Overall Financial Resource Strain (CARDIA)    Difficulty of Paying Living Expenses: Not hard at all  Food Insecurity: No Food Insecurity (09/22/2021)   Hunger Vital Sign    Worried About Running Out of Food in the Last Year: Never true    Ran Out of Food in the Last Year: Never true  Transportation Needs: Unmet Transportation Needs (06/07/2022)   PRAPARE - Hydrologist (Medical): Yes    Lack of Transportation (Non-Medical): Yes  Physical Activity: Not on file  Stress: Not on file  Social Connections: Not on file  Intimate Partner Violence: Not on file    Physical Exam      Future Appointments  Date Time Provider [place]  07/10/2022  3:20 PM Larey Dresser, MD [facility] None  08/29/2022  7:20 AM [facility] DEVICE REMOTES [facility]  11/28/2022  7:20 AM [facility] DEVICE REMOTES [facility]  02/27/2023  7:20 AM [facility] DEVICE REMOTES [facility]       Desmond Lope [phone] [facility] Par[facility]ic  06/29/22

## 2022-07-04 ENCOUNTER — Telehealth (HOSPITAL_COMMUNITY): Payer: Self-pay | Admitting: Emergency Medicine

## 2022-07-04 NOTE — Telephone Encounter (Signed)
Called and spoke with Richard [name] to remind him to schedule transportation for his cup coming clinic appointment on 1/15.  He advises he will call tomorrow.    [name], [place] 07/04/2022

## 2022-07-10 ENCOUNTER — Encounter (HOSPITAL_COMMUNITY): Payer: Medicare Other | Admitting: Cardiology

## 2022-07-19 ENCOUNTER — Telehealth (HOSPITAL_COMMUNITY): Payer: Self-pay | Admitting: Emergency Medicine

## 2022-07-19 NOTE — Progress Notes (Signed)
Paramedicine Encounter    Patient ID: Richard [name], male    DOB: [birth year], 62 y.o.   MRN: [id number]   Complaints no complaints  Assessment no chest pain or SOB.  Lung sounds clear and equal bilat.  No edema  Compliance with meds No  Pill box filled bubble packs  Refills needed none  Meds changes since last visit none    Social changes none   BP (!) 140/100 (BP Location: Left Arm, Patient Position: Sitting, Cuff Size: Normal)   Pulse 70   Resp 16   Wt 231 lb (104.8 kg)   SpO2 94%   BMI 27.39 kg/m  Weight yesterday-not taken Last visit weight-230lb  ACTION: Home visit completed  Bethanie Dicker [phone] 07/19/22  Patient Care Team: Olevia Perches as PCP - General (Internal Medicine) Laurey Morale, MD as PCP - Advanced Heart Failure (Cardiology) Duke Salvia, MD as PCP - Electrophysiology (Cardiology) Burna Sis, LCSW as Social Worker (Licensed Clinical Social Worker)  Patient Active Problem List   Diagnosis Date Noted   Hyperlipidemia 09/09/2021   Wound dehiscence 03/02/2021   Subacute osteomyelitis, right ankle and foot (HCC)    Sleep disturbance    Essential hypertension    Diabetic peripheral neuropathy (HCC)    Labile blood glucose    Left below-knee amputee (HCC) 02/07/2021   PAD (peripheral artery disease) (HCC) 02/01/2021   Below-knee amputation of left lower extremity (HCC) 01/27/2021   S/P BKA (below knee amputation), left (HCC)    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HCC)    SOB (shortness of breath)    Open wound    Bacteremia    Chronic ulcer of left ankle (HCC)    Severe sepsis without septic shock (HCC)    COPD exacerbation (HCC) 01/15/2021   Chronic systolic CHF (congestive heart failure) (HCC) 01/15/2021   Acute on chronic respiratory failure with hypoxia and hypercapnia (HCC) 01/15/2021   Acute renal failure superimposed on stage 3 chronic kidney disease (HCC) 01/15/2021   Elevated troponin 01/15/2021    Hyponatremia 01/15/2021   CHF exacerbation (HCC) 10/17/2020   Acute on chronic systolic CHF (congestive heart failure) (HCC) 10/16/2020   Acute on chronic combined systolic and diastolic CHF (congestive heart failure) (HCC) 03/15/2018   S/P right and left heart catheterization 11/02/2017   Panic attacks 11/02/2017   Non-STEMI (non-ST elevated myocardial infarction) (HCC)    COPD (chronic obstructive pulmonary disease) (HCC)    Hypertension    CKD (chronic kidney disease)    Single hamartoma of lung (HCC)    Presence of implantable cardioverter-defibrillator (ICD)    GERD (gastroesophageal reflux disease)    Diabetes mellitus with diabetic neuropathy, with long-term current use of insulin (HCC)     Current Outpatient Medications:    albuterol (VENTOLIN HFA) 108 (90 Base) MCG/ACT inhaler, Inhale 1-2 puffs into the lungs every 4 (four) hours as needed for wheezing or shortness of breath. (Patient not taking: Reported on 06/29/2022), Disp: 8 g, Rfl: 1   allopurinol (ZYLOPRIM) 100 MG tablet, Take 1 tablet (100 mg total) by mouth daily., Disp: 30 tablet, Rfl: 3   atorvastatin (LIPITOR) 40 MG tablet, TAKE ONE TABLET BY MOUTH DAILY, Disp: 90 tablet, Rfl: 3   bisoprolol (ZEBETA) 5 MG tablet, Take 0.5 tablets (2.5 mg total) by mouth daily., Disp: 45 tablet, Rfl: 3   clopidogrel (PLAVIX) 75 MG tablet, TAKE ONE TABLET BY MOUTH DAILY, Disp: 30 tablet, Rfl: 11   digoxin (LANOXIN) 0.125  MG tablet, TAKE 1 TABLET (0.125 MG TOTAL) BY MOUTH DAILY., Disp: 60 tablet, Rfl: 6   docusate sodium (COLACE) 100 MG capsule, Take 1 capsule (100 mg total) by mouth daily., Disp: 10 capsule, Rfl: 0   DULoxetine (CYMBALTA) 60 MG capsule, Take 1 capsule (60 mg total) by mouth daily., Disp: 30 capsule, Rfl: 2   empagliflozin (JARDIANCE) 25 MG TABS tablet, Take 25 mg by mouth daily., Disp: , Rfl:    fenofibrate (TRICOR) 145 MG tablet, Take 145 mg by mouth daily., Disp: , Rfl:    Fluticasone Furoate-Vilanterol (BREO ELLIPTA IN),  Inhale into the lungs daily as needed. (Patient not taking: Reported on 06/29/2022), Disp: , Rfl:    gabapentin (NEURONTIN) 400 MG capsule, Take 400 mg by mouth as needed., Disp: , Rfl:    hydrALAZINE (APRESOLINE) 50 MG tablet, Take 1.5 tablets (75 mg total) by mouth 3 (three) times daily., Disp: 540 tablet, Rfl: 2   insulin regular human CONCENTRATED (HUMULIN R) 500 UNIT/ML injection, Inject 1 Units into the skin in the morning, at noon, and at bedtime. As needed sliding scale, Disp: , Rfl:    isosorbide mononitrate (IMDUR) 30 MG 24 hr tablet, Take 0.5 tablets (15 mg total) by mouth daily. Do not take if planning to use Viagra, Disp: 30 tablet, Rfl: 11   Magnesium Oxide 200 MG TABS, Take 2 tablets (400 mg total) by mouth daily., Disp: 60 tablet, Rfl: 11   sacubitril-valsartan (ENTRESTO) 49-51 MG, Take 1 tablet by mouth 2 (two) times daily., Disp: 60 tablet, Rfl: 11   sildenafil (VIAGRA) 50 MG tablet, TAKE 1 TABLET (50 MG TOTAL) BY MOUTH DAILY AS NEEDED FOR ERECTILE DYSFUNCTION. (Patient not taking: Reported on 06/29/2022), Disp: 10 tablet, Rfl: 0   spironolactone (ALDACTONE) 25 MG tablet, Take 1 tablet (25 mg total) by mouth daily., Disp: 90 tablet, Rfl: 3   torsemide (DEMADEX) 20 MG tablet, Take 1 tablet (20 mg total) by mouth daily., Disp: 90 tablet, Rfl: 3   zinc sulfate 220 (50 Zn) MG capsule, Take 1 capsule (220 mg total) by mouth daily., Disp: 30 capsule, Rfl: 0 Allergies  Allergen Reactions   Aspirin Itching   Codeine Hives   Coconut (Cocos Nucifera) Itching     Social History   Socioeconomic History   Marital status: Divorced    Spouse name: Not on file   Number of children: Not on file   Years of education: Not on file   Highest education level: Not on file  Occupational History   Occupation: disability    Comment: stopped working in 2000 d/t occupational exposures  Tobacco Use   Smoking status: Former    Types: Cigarettes   Smokeless tobacco: Never   Tobacco comments:     "smoked when I drank"  Vaping Use   Vaping Use: Never used  Substance and Sexual Activity   Alcohol use: Not Currently    Comment: 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  Other Topics Concern   Not on file  Social History Narrative   Patient lives at home with wife and some of his 10 children. He self-administers his own medications.   Social Determinants of Health   Financial Resource Strain: Low Risk  (09/22/2021)   Overall Financial Resource Strain (CARDIA)    Difficulty of Paying Living Expenses: Not hard at all  Food Insecurity: No Food Insecurity (09/22/2021)   Hunger Vital Sign  Worried About Charity fundraiser in the Last Year: Never true    [place] in the Last Year: Never true  Transportation Needs: Unmet Transportation Needs (06/07/2022)   PRAPARE - Hydrologist (Medical): Yes    Lack of Transportation (Non-Medical): Yes  Physical Activity: Not on file  Stress: Not on file  Social Connections: Not on file  Intimate Partner Violence: Not on file          Future Appointments  Date Time Provider [place]  08/04/2022 12:00 PM Larey Dresser, MD [facility] None  08/29/2022  7:20 AM [facility] DEVICE REMOTES [facility]  11/28/2022  7:20 AM [facility] DEVICE REMOTES [facility]  02/27/2023  7:20 AM [facility] DEVICE REMOTES [facility]

## 2022-07-19 NOTE — Telephone Encounter (Signed)
Called to let Richard [name] know I have rescheduled his [facility] appointment for 08/04/22 @ 12:00. Also he states that he is supposed to go see his apartment at [employer].  Told him Eliezer Lofts would reach out today regarding [place] referral for furniture.    [name], [place] 07/19/2022

## 2022-07-21 ENCOUNTER — Telehealth (HOSPITAL_COMMUNITY): Payer: Self-pay | Admitting: Licensed Clinical Social Worker

## 2022-07-21 NOTE — Telephone Encounter (Signed)
H&V Care Navigation CSW Progress Note  Clinical Social Worker worked with pt to [employer] referral to help get furniture for new apartment he just got.  Pt will await to hear from [facility] regarding appt time.   SDOH Screenings   Food Insecurity: No Food Insecurity (09/22/2021)  Housing: Low Risk  (04/28/2022)  Transportation Needs: Unmet Transportation Needs (06/07/2022)  Financial Resource Strain: Low Risk  (09/22/2021)  Tobacco Use: Medium Risk (05/29/2022)    Jorge Ny, [facility] Desk#: [phone] Cell#: [phone]

## 2022-07-28 ENCOUNTER — Other Ambulatory Visit (HOSPITAL_COMMUNITY): Payer: Self-pay | Admitting: Emergency Medicine

## 2022-07-28 NOTE — Progress Notes (Unsigned)
Pt not home.  Waited about 10 minutes and left.    [name], [place] 08/02/2022

## 2022-08-03 ENCOUNTER — Telehealth (HOSPITAL_COMMUNITY): Payer: Self-pay | Admitting: Licensed Clinical Social Worker

## 2022-08-03 NOTE — Telephone Encounter (Signed)
H&V Care Navigation CSW Progress Note  Clinical Social Worker informed by [employer] that pt needing assistance with transport to appt tomorrow- has been unable to set up transport through [employer] as he normally would due to inability to contact.  Ride set up through [employer] for 11am.  Pt reports he has spoken to [employer] about furniture- has appt for later this month- will plan to move into his new apartment after he gets furniture.  SDOH Screenings   Food Insecurity: No Food Insecurity (09/22/2021)  Housing: Low Risk  (04/28/2022)  Transportation Needs: Unmet Transportation Needs (08/03/2022)  Financial Resource Strain: Low Risk  (09/22/2021)  Tobacco Use: Medium Risk (05/29/2022)   Jorge Ny, [facility] Desk#: [phone] Cell#: [phone]

## 2022-08-03 NOTE — Telephone Encounter (Signed)
08/03/2022  Richard [name] DOB: 1961/02/11 MRN: [id number]   RIDER WAIVER AND RELEASE OF LIABILITY  For the purposes of helping with transportation needs, [facility] partners with outside transportation providers (taxi companies, [place], Social research officer, government.) to give [employer] patients or other approved people the choice of on-demand rides [employer]") to our buildings for non-emergency visits.  By using [employer], I, the person signing this document, on behalf of myself and/or any legal minors (in my care using the [employer]), agree:  Government social research officer given to me are supplied by independent, outside transportation providers who do not work for, or have any affiliation with, [employer]. [place] is not a transportation company. [place] has no control over the quality or safety of the rides I get using [employer]. [place] has no control over whether any outside ride will happen on time or not. [place] gives no guarantee on the reliability, quality, safety, or availability on any rides, or that no mistakes will happen. I know and accept that traveling by vehicle (car, truck, SVU, Lucianne Lei, bus, taxi, etc.) has risks of serious injuries such as disability, being paralyzed, and death. I know and agree the risk of using [employer] is mine alone, and not [employer]. Transport Services are provided "as is" and as are available. The transportation providers are in charge for all inspections and care of the vehicles used to provide these rides. I agree not to take legal action against [place], its agents, employees, officers, directors, representatives, insurers, attorneys, assigns, successors, subsidiaries, and affiliates at any time for any reasons related directly or indirectly to using [employer]. I also agree not to take legal action against [place] or its affiliates for any injury, death, or damage to property caused by or related to using  [employer]. I have read this Waiver and Release of Liability, and I understand the terms used in it and their legal meaning. This Waiver is freely and voluntarily given with the understanding that my right (or any legal minors) to legal action against [place] relating to [employer] is knowingly given up to use these services.   I attest that I read the Ride Waiver and Release of Liability to Richard [name], gave Mr. Authement the opportunity to ask questions and answered the questions asked (if any). I affirm that Richard [name] then provided consent for assistance with transportation.     Jorge Ny

## 2022-08-04 ENCOUNTER — Encounter (HOSPITAL_COMMUNITY): Payer: Self-pay | Admitting: Cardiology

## 2022-08-04 ENCOUNTER — Other Ambulatory Visit (HOSPITAL_COMMUNITY): Payer: Self-pay | Admitting: Emergency Medicine

## 2022-08-04 ENCOUNTER — Ambulatory Visit (HOSPITAL_COMMUNITY)
Admission: RE | Admit: 2022-08-04 | Discharge: 2022-08-04 | Disposition: A | Discharge status: Home / Self Care | Payer: Medicare Other | Source: Ambulatory Visit | Attending: Cardiology | Admitting: Cardiology

## 2022-08-04 VITALS — BP 104/60 | HR 73 | Wt 231.6 lb

## 2022-08-04 DIAGNOSIS — Z7982 Long term (current) use of aspirin: Secondary | ICD-10-CM | POA: Insufficient documentation

## 2022-08-04 DIAGNOSIS — I130 Hypertensive heart and chronic kidney disease with heart failure and stage 1 through stage 4 chronic kidney disease, or unspecified chronic kidney disease: Secondary | ICD-10-CM | POA: Insufficient documentation

## 2022-08-04 DIAGNOSIS — M109 Gout, unspecified: Secondary | ICD-10-CM | POA: Insufficient documentation

## 2022-08-04 DIAGNOSIS — R9431 Abnormal electrocardiogram [ECG] [EKG]: Secondary | ICD-10-CM | POA: Diagnosis not present

## 2022-08-04 DIAGNOSIS — G4733 Obstructive sleep apnea (adult) (pediatric): Secondary | ICD-10-CM | POA: Insufficient documentation

## 2022-08-04 DIAGNOSIS — Z95810 Presence of automatic (implantable) cardiac defibrillator: Secondary | ICD-10-CM | POA: Insufficient documentation

## 2022-08-04 DIAGNOSIS — E1122 Type 2 diabetes mellitus with diabetic chronic kidney disease: Secondary | ICD-10-CM | POA: Insufficient documentation

## 2022-08-04 DIAGNOSIS — Z89512 Acquired absence of left leg below knee: Secondary | ICD-10-CM | POA: Insufficient documentation

## 2022-08-04 DIAGNOSIS — Z7984 Long term (current) use of oral hypoglycemic drugs: Secondary | ICD-10-CM | POA: Insufficient documentation

## 2022-08-04 DIAGNOSIS — I2510 Atherosclerotic heart disease of native coronary artery without angina pectoris: Secondary | ICD-10-CM | POA: Insufficient documentation

## 2022-08-04 DIAGNOSIS — Z79899 Other long term (current) drug therapy: Secondary | ICD-10-CM | POA: Diagnosis not present

## 2022-08-04 DIAGNOSIS — J449 Chronic obstructive pulmonary disease, unspecified: Secondary | ICD-10-CM | POA: Insufficient documentation

## 2022-08-04 DIAGNOSIS — I428 Other cardiomyopathies: Secondary | ICD-10-CM | POA: Diagnosis not present

## 2022-08-04 DIAGNOSIS — Z7902 Long term (current) use of antithrombotics/antiplatelets: Secondary | ICD-10-CM | POA: Diagnosis not present

## 2022-08-04 DIAGNOSIS — Z794 Long term (current) use of insulin: Secondary | ICD-10-CM | POA: Diagnosis not present

## 2022-08-04 DIAGNOSIS — E781 Pure hyperglyceridemia: Secondary | ICD-10-CM | POA: Diagnosis not present

## 2022-08-04 DIAGNOSIS — D751 Secondary polycythemia: Secondary | ICD-10-CM | POA: Insufficient documentation

## 2022-08-04 DIAGNOSIS — Z955 Presence of coronary angioplasty implant and graft: Secondary | ICD-10-CM | POA: Diagnosis not present

## 2022-08-04 DIAGNOSIS — I5022 Chronic systolic (congestive) heart failure: Secondary | ICD-10-CM | POA: Diagnosis not present

## 2022-08-04 DIAGNOSIS — K219 Gastro-esophageal reflux disease without esophagitis: Secondary | ICD-10-CM | POA: Insufficient documentation

## 2022-08-04 LAB — BASIC METABOLIC PANEL
Anion gap: 14 (ref 5–15)
BUN: 19 mg/dL (ref 6–20)
CO2: 26 mmol/L (ref 22–32)
Calcium: 9.2 mg/dL (ref 8.9–10.3)
Chloride: 98 mmol/L (ref 98–111)
Creatinine, Ser: 1.19 mg/dL (ref 0.61–1.24)
GFR, Estimated: >60 mL/min (ref >60)
Glucose, Bld: 166 mg/dL — ABNORMAL HIGH (ref 70–99)
Potassium: 4 mmol/L (ref 3.5–5.1)
Sodium: 138 mmol/L (ref 135–145)

## 2022-08-04 LAB — DIGOXIN LEVEL: Digoxin Level: 0.5 ng/mL — ABNORMAL LOW (ref 0.8–2.0)

## 2022-08-04 MED ORDER — BISOPROLOL FUMARATE 5 MG PO TABS: 5.0000 mg | ORAL_TABLET | Freq: Every day | ORAL | 3 refills | Status: DC

## 2022-08-04 NOTE — Patient Instructions (Signed)
INCREASE Bisoprolol to 5 mg daily.  Labs done today, your results will be available in MyChart, we will contact you for abnormal readings.  Your physician recommends that you schedule a follow-up appointment in: 3 months  If you have any questions or concerns before your next appointment please send [place] a message through [place] or call our office at [phone].    TO LEAVE A MESSAGE FOR THE NURSE SELECT OPTION 2, PLEASE LEAVE A MESSAGE INCLUDING: YOUR NAME DATE OF BIRTH CALL BACK NUMBER REASON FOR CALL**this is important as we prioritize the call backs  YOU WILL RECEIVE A CALL BACK THE SAME DAY AS LONG AS YOU CALL BEFORE 4:00 PM  At the [facility], you and your health needs are our priority. As part of our continuing mission to provide you with exceptional heart care, we have created designated Provider Care Teams. These Care Teams include your primary Cardiologist (physician) and Advanced Practice Providers (APPs- Physician Assistants and Nurse Practitioners) who all work together to provide you with the care you need, when you need it.   You may see any of the following providers on your designated Care Team at your next follow up: Dr Glori Bickers Dr Loralie Champagne Dr. Roxana Hires, NP Lyda Jester, [place] [facility] [place], [place] Forestine Na, NP Audry Riles, PharmD   Please be sure to bring in all your medications bottles to every appointment.    Thank you for choosing [facility]

## 2022-08-04 NOTE — Progress Notes (Signed)
Paramedicine Encounter   Patient ID: Richard [name] , male,   DOB: [birth year],61 y.o.,  MRN: [id number]   Met patient in clinic today with provider.   [id number] clinic- B/P-104/60 P- 73 [id number]%   Med changes today (if any) : Increased Bisoprolol from 2.13m. to 582mdaily.  [facility] will be [facility] on Monday.  He just got new set of meds in bubble packs.  I suggest he take 1/2 tablet in addition to the 1/2 tab already in his bubble pack to equal 61m761mBisoprolol.  I will visit with pt to review this with him for accuracy.  [facility] [place] [facility] I was able to confirm that [facility] still be able to deliver meds to his new address.  DedRenee RamusMTAuburn9/2024

## 2022-08-05 NOTE — Progress Notes (Signed)
Advanced Heart Failure Clinic Note   ID:  Kinsey, Rupani 26-Apr-1961, MRN [id number]   Provider location: [facility] Type of Visit: Established patient   PCP:  Robert Bellow, PA-C HF Cardiologist:  Dr. Aundra Dubin   HPI Richard [name] is a 62 y.o. male who has a history of chronic systolic HF s/p Medtronic ICD (12/2016), HTN, DM, and HLD.   Admitted [id number] with A/C systolic HF. Echo showed EF 15-20%. Advanced HF team was consulted. He diuresed 20 lbs with IV lasix, then transitioned to torsemide 40 mg daily. Underwent R/LHC showing a focal severe RCA stenosis treated with DES.  He did not, however, appear to have severe enough coronary disease to explain his cardiomyopathy. Cardiac index was low at 1.9.  HF meds were optimized. Beta blocker was not started due to low CI and soft BPs. He was discharged on home oxygen. Referred to cardiac rehab. DC weight: 260 lbs.   Echo in 2/20 showed EF 20-25% with severe LV dilation, moderate RV enlargement with moderately decreased RV systolic function.  CPX in 2/21 showed a severe functional limitation but it actually appeared to be primarily due to pulmonary issues.   Echo in 1/22 showed EF < 20% with severe LV dilation, normal RV size with moderately decreased systolic function, dilated IVC.   He was admitted in 4/22 with CHF.   TEE in 7/22 showed EF 20% with severe LV dilation, moderate RV enlargement with moderately decreased RV systolic function.   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  Echo in 12/23 showed EF 20-25%, mild LV dilation, mildly decreased RV systolic function, IVC normal.   Today he returns for HF follow up. Weight stable.  Getting ready to move into his own apartment.  Tries to take his meds but sometimes missed.  Paramedicine is following.  Generally feeling good, no chest pain and no dyspnea walking on flat ground.  Walks on his prostheses without  problems.  No orthopnea/PND.  No chest pain.    ECG (personally reviewed): NSR, LVH with QRS widening 150 msec  Medtronic device interrogation (personally reviewed): Stable thoracic impedance, no VT/AF.   Labs (10/19): K 3.9, creatinine 1.18  Labs (10/20): BNP 160, K 3.8, creatinine 1.31 Labs (4/21): TGs 475, LDL 67, K 4.2, creatinine 1.8, urine and serum immunofixations negative.  Labs (11/21): LDL 42, HDL 27, K 4.3, creatinine 1.81 Labs (5/22): K 4.5, creatinine 1.46 Labs (11/19/20): SCr 2.0, 4.5  Labs (11/22): K 4.6, creatinine 1.15 Labs (3/22): K 4.3, creatinine 1.46. LDL 42, TG 68  Labs (5/23): K 4.5, creatinine 1.5 Labs (6/23): K 4.4, creatinine 1.47 Labs (9/23): K 4.1, creatinine 1.75, normal LFTs, LDL 67, HDL 27 Labs (10/23): K 3.9, creatinine 1.36, digoxin 0.3 Labs (12/23): K 4.3, creatinine 1.3  PMH: 1. Chronic systolic CHF: Primarily nonischemic cardiomyopathy.  Has Medtronic ICD.   - Echo 5/19: EF 15-20%, grade 2 DD, mild MR, RV severely reduced, LA moderately dilated, RA mildly dilated, mild TR.  - RHC/LHC (5/19): 80% distal LAD stenosis, 80% proximal RCA stenosis treated with DES to pRCA. Mean RA 13, PA 69/31 mean 47, mean 24, CI 1.91, PVR 4.8 WU.  - Gynecomastia with spironolactone. - Echo (2/20): EF 20-25%, severe LV enlargement, moderate RV enlargement, moderately decreased RV systolic function.  - CPX (2/21): peak VO2 13.2, VE/VCO2 slope 24, RER 1.10. Severe functional limitation due to OHS with severe restrictive lung disease without  significant HF limitation.  - PYP scan (7/21): Negative for TTR cardiac amyloidosis.  - Echo (1/22): EF < 20% with severe LV dilation, normal RV size with moderately decreased systolic function, dilated IVC.  - TEE (7/22): EF 20% with severe LV dilation, moderate RV enlargement with moderately decreased RV systolic function.  - Echo (12/23): EF 20-25%, mild LV dilation, mildly decreased RV systolic function, IVC normal.  2. COPD: Uses 2L  home oxygen. Never smoked, but apparently had significant occupational exposure.  It appears that he won a lawsuit dealing with the occupational exposure-related COPD.  3. CAD: Cath in 5/17 with 60% RCA stenosis.  - LHC (5/19): 80% distal LAD, 80% proximal RCA.  DES to proximal RCA.  4. Polycythemia: Probably due to chronic hypoxemia.  5. Type II diabetes.  6. HTN 7. GERD 8. CKD: Stage 3.  Likely due to diabetes and HTN.  9. OSA: Unable to tolerate CPAP.  10. PAD: Right and left BKA 2022.  11. 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 Group B Strep bacteremia  Review of systems complete and found to be negative unless listed in HPI.    Current Outpatient Medications  Medication Sig Dispense Refill   albuterol (VENTOLIN HFA) 108 (90 Base) MCG/ACT inhaler Inhale 1-2 puffs into the lungs every 4 (four) hours as needed for wheezing or shortness of breath. 8 g 1   allopurinol (ZYLOPRIM) 100 MG tablet Take 1 tablet (100 mg total) by mouth daily. 30 tablet 3   aspirin EC 81 MG tablet Take 81 mg by mouth daily. Swallow whole.     atorvastatin (LIPITOR) 40 MG tablet TAKE ONE TABLET BY MOUTH DAILY 90 tablet 3   clopidogrel (PLAVIX) 75 MG tablet TAKE ONE TABLET BY MOUTH DAILY 30 tablet 11   digoxin (LANOXIN) 0.125 MG tablet TAKE 1 TABLET (0.125 MG TOTAL) BY MOUTH DAILY. 60 tablet 6   docusate sodium (COLACE) 100 MG capsule Take 1 capsule (100 mg total) by mouth daily. 10 capsule 0   DULoxetine (CYMBALTA) 60 MG capsule Take 1 capsule (60 mg total) by mouth daily. 30 capsule 2   empagliflozin (JARDIANCE) 25 MG TABS tablet Take 25 mg by mouth daily.     fenofibrate (TRICOR) 145 MG tablet Take 145 mg by mouth daily.     gabapentin (NEURONTIN) 400 MG capsule Take 400 mg by mouth 3 (three) times daily.     hydrALAZINE (APRESOLINE) 50 MG tablet Take 1.5 tablets (75 mg total) by mouth 3 (three) times daily. 540 tablet 2   insulin regular human CONCENTRATED (HUMULIN R) 500 UNIT/ML injection Inject 1  Units into the skin in the morning, at noon, and at bedtime. As needed sliding scale     isosorbide mononitrate (IMDUR) 30 MG 24 hr tablet Take 0.5 tablets (15 mg total) by mouth daily. Do not take if planning to use Viagra 30 tablet 11   Magnesium Oxide 200 MG TABS Take 2 tablets (400 mg total) by mouth daily. 60 tablet 11   omeprazole (PRILOSEC) 20 MG capsule Take 20 mg by mouth daily.     sacubitril-valsartan (ENTRESTO) 49-51 MG Take 1 tablet by mouth 2 (two) times daily. 60 tablet 11   sildenafil (VIAGRA) 50 MG tablet TAKE 1 TABLET (50 MG TOTAL) BY MOUTH DAILY AS NEEDED FOR ERECTILE DYSFUNCTION. 10 tablet 0   spironolactone (ALDACTONE) 25 MG tablet Take 1 tablet (25 mg total) by mouth daily. 90 tablet 3   torsemide (DEMADEX) 20 MG tablet Take  1 tablet (20 mg total) by mouth daily. 90 tablet 3   zinc sulfate 220 (50 Zn) MG capsule Take 1 capsule (220 mg total) by mouth daily. 30 capsule 0   bisoprolol (ZEBETA) 5 MG tablet Take 1 tablet (5 mg total) by mouth daily. 90 tablet 3   No current facility-administered medications for this encounter.   Allergies  Allergen Reactions   Aspirin Itching   Codeine Hives   Coconut (Cocos Nucifera) Itching   Social History   Socioeconomic History   Marital status: Divorced    Spouse name: Not on file   Number of children: Not on file   Years of education: Not on file   Highest education level: Not on file  Occupational History   Occupation: disability    Comment: stopped working in 2000 d/t occupational exposures  Tobacco Use   Smoking status: Former    Types: Cigarettes   Smokeless tobacco: Never   Tobacco comments:    "smoked when I drank"  Vaping Use   Vaping Use: Never used  Substance and Sexual Activity   Alcohol use: Not Currently    Comment: 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  Other Topics Concern   Not on file  Social History Narrative   Patient lives at  home with wife and some of his 10 children. He self-administers his own medications.   Social Determinants of Health   Financial Resource Strain: Low Risk  (09/22/2021)   Overall Financial Resource Strain (CARDIA)    Difficulty of Paying Living Expenses: Not hard at all  Food Insecurity: No Food Insecurity (09/22/2021)   Hunger Vital Sign    Worried About Running Out of Food in the Last Year: Never true    Ran Out of Food in the Last Year: Never true  Transportation Needs: Unmet Transportation Needs (08/03/2022)   PRAPARE - Hydrologist (Medical): Yes    Lack of Transportation (Non-Medical): Yes  Physical Activity: Not on file  Stress: Not on file  Social Connections: Not on file  Intimate Partner Violence: Not on file   Family History  Problem Relation Age of Onset   Hypertension Mother    Diabetes Mother    Hypertension Father    Diabetes Father    Diabetes Sister    Diabetes Brother    BP 104/60   Pulse 73   Wt 105.1 kg (231 lb 9.6 oz)   SpO2 97%   BMI 27.46 kg/m   Wt Readings from Last 3 Encounters:  08/04/22 105.1 kg (231 lb 9.6 oz)  06/29/22 104.4 kg (230 lb 3.2 oz)  06/20/22 104.3 kg (230 lb)   PHYSICAL EXAM: General: NAD Neck: No JVD, no thyromegaly or thyroid nodule.  Lungs: Clear to auscultation bilaterally with normal respiratory effort. CV: Nondisplaced PMI.  Heart regular S1/S2, no S3/S4, no murmur.  No peripheral edema.  No carotid bruit.  Normal pedal pulses.  Abdomen: Soft, nontender, no hepatosplenomegaly, no distention.  Skin: Intact without lesions or rashes.  Neurologic: Alert and oriented x 3.  Psych: Normal affect. Extremities: No clubbing or cyanosis. Bilateral BKAs HEENT: Normal.   ASSESSMENT & PLAN: 1. Chronic systolic CHF:  Primarily nonischemic cardiomyopathy.  Echo in 2017 with EF 15-20%.  Medtronic ICD. Echo 10/2017 EF 15-20%, severely dilated RV with severely decreased systolic function.  LHC/RHC 11/06/17 showed  volume overload with 80% RCA stenosis.  Cardiac index low at 1.91. The degree of coronary disease does not explain his cardiomyopathy.  Echo in 2/20 showed severe LV dilation, EF 20-25%, moderately decreased RV systolic function.  PYP scan in 7/21 not suggestive of transthyretin amyloidosis. Echo in 1/22 showed EF < 20%, moderately decreased RV systolic function.  TEE in 7/22 with EF 20% with severe LV dilation, moderate RV enlargement with moderately decreased RV systolic function. Echo today showed EF 20-25%, mild LV dilation, mildly decreased RV systolic function, IVC normal.  NYHA class II symptoms, he is not volume overloaded by exam or Optivol.  - Continue Imdur 15 mg daily + hydralazine 75 mg tid.  - Continue torsemide 20 mg daily. BMET today.  - Continue Entresto 49/51 bid.   - Continue empaglifozin.  - Continue spironolactone 25 mg daily.  - Continue digoxin 0.125 mg daily, check level.  - Increase bisoprolol to 5 mg daily.  - Continue Paramedicine.  2. COPD: Never smoked, but apparently had significant occupational exposure.  It appears that he won a lawsuit dealing with the occupational exposure-related COPD. CPX in 2/21 showed severe functional limitation, but it appeared to be due to pulmonary abnormalities rather than HF. He was on home oxygen in the past but is not using it now.   - Followup with pulmonary.  3. CAD:  LHC in 5/19 with 80-90% proximal RCA stenosis treated with DES to RCA.  This did not cause his cardiomyopathy but is a large RCA. No chest pain.  - Continue single agent Plavix.  - Continue atorvastatin, good lipids in 9/23.  4. Polycythemia: Likely related to chronic hypoxia.  5. OSA: Cannot tolerate CPAP, wears oxygen at night.  6. DM: He is on empagliflozin.   7. Hypertriglyceridemia: Off Vascepa due to blood in stool that resolved.  - Continue fenofibrate 145 mg daily.   Follow up in 3 months with APP    Signed, Loralie Champagne, MD  08/05/2022  [facility] [address] [facility] [place] [address] [phone] (office) [phone] (fax)

## 2022-08-07 ENCOUNTER — Other Ambulatory Visit (HOSPITAL_COMMUNITY): Payer: Self-pay | Admitting: Cardiology

## 2022-08-10 ENCOUNTER — Telehealth (HOSPITAL_COMMUNITY): Payer: Self-pay | Admitting: Licensed Clinical Social Worker

## 2022-08-10 ENCOUNTER — Other Ambulatory Visit (HOSPITAL_COMMUNITY): Payer: Self-pay | Admitting: Emergency Medicine

## 2022-08-10 NOTE — Telephone Encounter (Signed)
H&V Care Navigation CSW Progress Note  Clinical Social Worker informed by paramedic that pt about to move into his own apartment and won't have a phone- has been depending on house phone and doesn't have the funds to buy a phone until next month when he gets his check.  CSW offered to provide with tracfone so he can keep up with his medical care- he is agreeable and understands he will be responsible for his phone payments after the first month.  Paramedic will plan to come get phone and take to patient.   SDOH Screenings   Food Insecurity: No Food Insecurity (09/22/2021)  Housing: Low Risk  (04/28/2022)  Transportation Needs: Unmet Transportation Needs (08/03/2022)  Financial Resource Strain: Low Risk  (09/22/2021)  Tobacco Use: Medium Risk (08/04/2022)   Richard [name], [facility] Desk#: [phone] Cell#: [phone]

## 2022-08-10 NOTE — Progress Notes (Signed)
Paramedicine Encounter    Patient ID: Richard [name], male    DOB: 1960/08/25, 62 y.o.   MRN: [id number]   Complaints None  Assessment Denies chest pain or SOB.  Lung sounds clear. No edema noted.  Compliance with meds Not good w/ med compliance.  Lot of instability in home environment  Pill box filled On bubble packs  Refills needed  None  Meds changes since last visit Bisoprolol increased to 42m. Daily. (Has not yet been delivered by pharmacy could be today or tomorrow.    Social changes Richard [name] to an apartment in [place]   BP (!) 150/100 (BP Location: Left Arm, Patient Position: Sitting, Cuff Size: Normal)   Pulse 60   Resp 16   SpO2 96%  Weight yesterday-not taken Last visit weight-231lb  [name].  He has gotten off track with his meds.  Recent med change is increasing Bisoprolol from 2.557m to 64m36mDaily.  He is working out of bubble packs so when he gets his script he will add a 1/2 64mg67mblet to the half currently in his bubble pack to equal 64mg.107mAlso requested assistance for Richard [name] a cell phone and Richard [name] to provide same for him and I will drop off at his residence this evening.  ACTION: Home visit completed  Jakaiya Netherland Skipper [id number]  Patient Care Team: WeissRip HarbourCP - General (Internal Medicine) McLeaLarey Dresseras PCP - Advanced Heart Failure (Cardiology) KleinDeboraha Sprangas PCP - Electrophysiology (Cardiology) Uris,Jorge NyW as Social Worker (Licensed Clinical Social Worker)  Patient Active Problem List   Diagnosis Date Noted   Hyperlipidemia 09/09/2021   Wound dehiscence 03/02/2021   Subacute osteomyelitis, right ankle and foot (HCC) [place]Sleep disturbance    Essential hypertension    Diabetic peripheral neuropathy (HCC) [place]ile blood glucose    Left below-knee amputee  (HCC) [place]15/2022   PAD (peripheral artery disease) (HCC) [place]09/2022   Below-knee amputation of left lower extremity (HCC) [place]04/2022   S/P BKA (below knee amputation), left (HCC)    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HCC) Beller[place] AcresSOB (shortness of breath)    Open wound    Bacteremia    Chronic ulcer of left ankle (HCC)    Severe sepsis without septic shock (HCC)    COPD exacerbation (HCC) [id number] systolic CHF (congestive heart failure) (HCC) [place]23/2022   Acute on chronic respiratory failure with hypoxia and hypercapnia (HCC) [place]23/2022   Acute renal failure superimposed on stage 3 chronic kidney disease (HCC) [place]23/2022   Elevated troponin 01/15/2021   Hyponatremia 01/15/2021   CHF exacerbation (HCC) [place]24/2022   Acute on chronic systolic CHF (congestive heart failure) (HCC) [place]23/2022   Acute on chronic combined systolic and diastolic CHF (congestive heart failure) (HCC) [place]20/2019   S/P right and left heart catheterization 11/02/2017   Panic attacks 11/02/2017   Non-STEMI (non-ST elevated myocardial infarction) (HCC) WelcomeCOPD (chronic obstructive pulmonary disease) (HCC) [place] LevelHypertension    CKD (chronic kidney disease)    Single hamartoma of lung (HCC) [place]ence of implantable cardioverter-defibrillator (ICD)    GERD (gastroesophageal reflux disease)    Diabetes mellitus with diabetic neuropathy, with long-term current use of insulin (  [place])     Current Outpatient Medications:    albuterol (VENTOLIN HFA) 108 (90 Base) MCG/ACT inhaler, Inhale 1-2 puffs into the lungs every 4 (four) hours as needed for wheezing or shortness of breath., Disp: 8 g, Rfl: 1   allopurinol (ZYLOPRIM) 100 MG tablet, Take 1 tablet (100 mg total) by mouth daily., Disp: 30 tablet, Rfl: 3   aspirin EC 81 MG tablet, Take 81 mg by mouth daily. Swallow whole., Disp: , Rfl:    atorvastatin (LIPITOR) 40 MG tablet, TAKE ONE TABLET BY MOUTH DAILY, Disp: 90 tablet, Rfl: 10   bisoprolol (ZEBETA) 5 MG tablet,  Take 1 tablet (5 mg total) by mouth daily., Disp: 90 tablet, Rfl: 3   clopidogrel (PLAVIX) 75 MG tablet, TAKE ONE TABLET BY MOUTH DAILY, Disp: 30 tablet, Rfl: 10   digoxin (LANOXIN) 0.125 MG tablet, TAKE 1 TABLET (0.125 MG TOTAL) BY MOUTH DAILY., Disp: 60 tablet, Rfl: 6   docusate sodium (COLACE) 100 MG capsule, Take 1 capsule (100 mg total) by mouth daily., Disp: 10 capsule, Rfl: 0   DULoxetine (CYMBALTA) 60 MG capsule, Take 1 capsule (60 mg total) by mouth daily., Disp: 30 capsule, Rfl: 2   empagliflozin (JARDIANCE) 25 MG TABS tablet, Take 25 mg by mouth daily., Disp: , Rfl:    fenofibrate (TRICOR) 145 MG tablet, TAKE ONE TABLET BY MOUTH DAILY, Disp: 90 tablet, Rfl: 10   gabapentin (NEURONTIN) 400 MG capsule, Take 400 mg by mouth 3 (three) times daily., Disp: , Rfl:    hydrALAZINE (APRESOLINE) 50 MG tablet, Take 1.5 tablets (75 mg total) by mouth 3 (three) times daily., Disp: 540 tablet, Rfl: 2   insulin regular human CONCENTRATED (HUMULIN R) 500 UNIT/ML injection, Inject 1 Units into the skin in the morning, at noon, and at bedtime. As needed sliding scale, Disp: , Rfl:    isosorbide mononitrate (IMDUR) 30 MG 24 hr tablet, Take 0.5 tablets (15 mg total) by mouth daily. Do not take if planning to use Viagra, Disp: 30 tablet, Rfl: 11   Magnesium Oxide 200 MG TABS, Take 2 tablets (400 mg total) by mouth daily., Disp: 60 tablet, Rfl: 11   omeprazole (PRILOSEC) 20 MG capsule, Take 20 mg by mouth daily., Disp: , Rfl:    sacubitril-valsartan (ENTRESTO) 49-51 MG, Take 1 tablet by mouth 2 (two) times daily., Disp: 60 tablet, Rfl: 11   sildenafil (VIAGRA) 50 MG tablet, TAKE 1 TABLET (50 MG TOTAL) BY MOUTH DAILY AS NEEDED FOR ERECTILE DYSFUNCTION., Disp: 10 tablet, Rfl: 0   spironolactone (ALDACTONE) 25 MG tablet, Take 1 tablet (25 mg total) by mouth daily., Disp: 90 tablet, Rfl: 3   torsemide (DEMADEX) 20 MG tablet, Take 1 tablet (20 mg total) by mouth daily., Disp: 90 tablet, Rfl: 3   zinc sulfate 220 (50  Zn) MG capsule, Take 1 capsule (220 mg total) by mouth daily., Disp: 30 capsule, Rfl: 0 Allergies  Allergen Reactions   Aspirin Itching   Codeine Hives   Coconut (Cocos Nucifera) Itching     Social History   Socioeconomic History   Marital status: Divorced    Spouse name: Not on file   Number of children: Not on file   Years of education: Not on file   Highest education level: Not on file  Occupational History   Occupation: disability    Comment: stopped working in 2000 d/t occupational exposures  Tobacco Use   Smoking status: Former    Types: Cigarettes   Smokeless tobacco: Never  Tobacco comments:    "smoked when I drank"  Vaping Use   Vaping Use: Never used  Substance and Sexual Activity   Alcohol use: Not Currently    Comment: 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  Other Topics Concern   Not on file  Social History Narrative   Patient lives at home with wife and some of his 10 children. He self-administers his own medications.   Social Determinants of Health   Financial Resource Strain: Low Risk  (09/22/2021)   Overall Financial Resource Strain (CARDIA)    Difficulty of Paying Living Expenses: Not hard at all  Food Insecurity: No Food Insecurity (09/22/2021)   Hunger Vital Sign    Worried About Running Out of Food in the Last Year: Never true    Ran Out of Food in the Last Year: Never true  Transportation Needs: Unmet Transportation Needs (08/03/2022)   PRAPARE - Hydrologist (Medical): Yes    Lack of Transportation (Non-Medical): Yes  Physical Activity: Not on file  Stress: Not on file  Social Connections: Not on file  Intimate Partner Violence: Not on file    Physical Exam      Future Appointments  Date Time Provider [place]  08/29/2022  7:20 AM [facility] DEVICE REMOTES [facility]  11/01/2022 11:30 AM [facility] PA/NP [facility] None   11/28/2022  7:20 AM [facility] DEVICE REMOTES [facility]  02/27/2023  7:20 AM [facility] DEVICE REMOTES [facility]

## 2022-08-17 ENCOUNTER — Other Ambulatory Visit (HOSPITAL_COMMUNITY): Payer: Self-pay | Admitting: Emergency Medicine

## 2022-08-17 NOTE — Progress Notes (Signed)
Paramedicine Encounter    Patient ID: Richard [name], male    DOB: Oct 27, 1960, 62 y.o.   MRN: [id number]   Complaints NONE  Assessment No chest pain, SOB. No edema. Lung sounds clear throughout  Compliance with meds admits to not taking meds the past 3 days  Pill box filled (in bubble packs)  Refills needed No  Meds changes since last visit NONE    Social changes Moving into an apt in [place]   BP (!) 140/100 (BP Location: Left Arm, Patient Position: Sitting, Cuff Size: Normal)   Pulse 60   Resp 16   Wt 231 lb 12.8 oz (105.1 kg)   SpO2 96%   BMI 27.49 kg/m  Weight yesterday-not taken Last visit weight-231lb  Mr. Sarker states he feels "fine".  He is in the middle of moving to his apartment at [facility] in [place].  The visit today takes place at [address] address.  He admits to getting off track with his meds.  He says he has left his meds at his apartment and has not taken any for the past 3 days.  He does take meds from bubble packs.  I encouraged him to get back on track and not let his new living arrangement influence his med compliance or lack thereof.  He advised he would do his best to get back on track.  He is maintaining his weight and overall looks good.   We also discussed whether he felt he should seek counseling and he said he might consider same.  ACTION: Home visit completed  Skipper Cliche [id number] 08/17/22  Patient Care Team: Rip Harbour as PCP - General (Internal Medicine) Larey Dresser, MD as PCP - Advanced Heart Failure (Cardiology) Deboraha Sprang, MD as PCP - Electrophysiology (Cardiology) Jorge Ny, LCSW as Social Worker (Licensed Clinical Social Worker)  Patient Active Problem List   Diagnosis Date Noted   Hyperlipidemia 09/09/2021   Wound dehiscence 03/02/2021   Subacute osteomyelitis, right ankle and foot ([place])    Sleep disturbance    Essential hypertension    Diabetic peripheral neuropathy  ([place])    Labile blood glucose    Left below-knee amputee ([place]) 02/07/2021   PAD (peripheral artery disease) ([place]) 02/01/2021   Below-knee amputation of left lower extremity ([place]) 01/27/2021   S/P BKA (below knee amputation), left (HCC)    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[place])    SOB (shortness of breath)    Open wound    Bacteremia    Chronic ulcer of left ankle (HCC)    Severe sepsis without septic shock (HCC)    COPD exacerbation ([place]) [id number]   Chronic systolic CHF (congestive heart failure) ([place]) 01/15/2021   Acute on chronic respiratory failure with hypoxia and hypercapnia ([place]) 01/15/2021   Acute renal failure superimposed on stage 3 chronic kidney disease ([place]) 01/15/2021   Elevated troponin 01/15/2021   Hyponatremia 01/15/2021   CHF exacerbation ([place]) 10/17/2020   Acute on chronic systolic CHF (congestive heart failure) ([place]) 10/16/2020   Acute on chronic combined systolic and diastolic CHF (congestive heart failure) ([place]) 03/15/2018   S/P right and left heart catheterization 11/02/2017   Panic attacks 11/02/2017   Non-STEMI (non-ST elevated myocardial infarction) ([place])    COPD (chronic obstructive pulmonary disease) ([place])    Hypertension    CKD (chronic kidney disease)    Single hamartoma of lung ([place])    Presence of implantable cardioverter-defibrillator (ICD)  GERD (gastroesophageal reflux disease)    Diabetes mellitus with diabetic neuropathy, with long-term current use of insulin (HCC)     Current Outpatient Medications:    albuterol (VENTOLIN HFA) 108 (90 Base) MCG/ACT inhaler, Inhale 1-2 puffs into the lungs every 4 (four) hours as needed for wheezing or shortness of breath., Disp: 8 g, Rfl: 1   allopurinol (ZYLOPRIM) 100 MG tablet, Take 1 tablet (100 mg total) by mouth daily., Disp: 30 tablet, Rfl: 3   aspirin EC 81 MG tablet, Take 81 mg by mouth daily. Swallow whole., Disp: , Rfl:    atorvastatin (LIPITOR) 40 MG tablet, TAKE ONE TABLET BY MOUTH DAILY,  Disp: 90 tablet, Rfl: 10   bisoprolol (ZEBETA) 5 MG tablet, Take 1 tablet (5 mg total) by mouth daily., Disp: 90 tablet, Rfl: 3   clopidogrel (PLAVIX) 75 MG tablet, TAKE ONE TABLET BY MOUTH DAILY, Disp: 30 tablet, Rfl: 10   digoxin (LANOXIN) 0.125 MG tablet, TAKE 1 TABLET (0.125 MG TOTAL) BY MOUTH DAILY., Disp: 60 tablet, Rfl: 6   docusate sodium (COLACE) 100 MG capsule, Take 1 capsule (100 mg total) by mouth daily., Disp: 10 capsule, Rfl: 0   DULoxetine (CYMBALTA) 60 MG capsule, Take 1 capsule (60 mg total) by mouth daily., Disp: 30 capsule, Rfl: 2   empagliflozin (JARDIANCE) 25 MG TABS tablet, Take 25 mg by mouth daily., Disp: , Rfl:    fenofibrate (TRICOR) 145 MG tablet, TAKE ONE TABLET BY MOUTH DAILY, Disp: 90 tablet, Rfl: 10   gabapentin (NEURONTIN) 400 MG capsule, Take 400 mg by mouth 3 (three) times daily., Disp: , Rfl:    hydrALAZINE (APRESOLINE) 50 MG tablet, Take 1.5 tablets (75 mg total) by mouth 3 (three) times daily., Disp: 540 tablet, Rfl: 2   insulin regular human CONCENTRATED (HUMULIN R) 500 UNIT/ML injection, Inject 1 Units into the skin in the morning, at noon, and at bedtime. As needed sliding scale, Disp: , Rfl:    isosorbide mononitrate (IMDUR) 30 MG 24 hr tablet, Take 0.5 tablets (15 mg total) by mouth daily. Do not take if planning to use Viagra, Disp: 30 tablet, Rfl: 11   Magnesium Oxide 200 MG TABS, Take 2 tablets (400 mg total) by mouth daily., Disp: 60 tablet, Rfl: 11   omeprazole (PRILOSEC) 20 MG capsule, Take 20 mg by mouth daily., Disp: , Rfl:    sacubitril-valsartan (ENTRESTO) 49-51 MG, Take 1 tablet by mouth 2 (two) times daily., Disp: 60 tablet, Rfl: 11   sildenafil (VIAGRA) 50 MG tablet, TAKE 1 TABLET (50 MG TOTAL) BY MOUTH DAILY AS NEEDED FOR ERECTILE DYSFUNCTION., Disp: 10 tablet, Rfl: 0   spironolactone (ALDACTONE) 25 MG tablet, Take 1 tablet (25 mg total) by mouth daily., Disp: 90 tablet, Rfl: 3   torsemide (DEMADEX) 20 MG tablet, Take 1 tablet (20 mg total) by  mouth daily., Disp: 90 tablet, Rfl: 3   zinc sulfate 220 (50 Zn) MG capsule, Take 1 capsule (220 mg total) by mouth daily., Disp: 30 capsule, Rfl: 0 Allergies  Allergen Reactions   Aspirin Itching   Codeine Hives   Coconut (Cocos Nucifera) Itching     Social History   Socioeconomic History   Marital status: Divorced    Spouse name: Not on file   Number of children: Not on file   Years of education: Not on file   Highest education level: Not on file  Occupational History   Occupation: disability    Comment: stopped working in 2000 d/t occupational exposures  Tobacco Use   Smoking status: Former    Types: Cigarettes   Smokeless tobacco: Never   Tobacco comments:    "smoked when I drank"  Vaping Use   Vaping Use: Never used  Substance and Sexual Activity   Alcohol use: Not Currently    Comment: 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  Other Topics Concern   Not on file  Social History Narrative   Patient lives at home with wife and some of his 10 children. He self-administers his own medications.   Social Determinants of Health   Financial Resource Strain: Low Risk  (09/22/2021)   Overall Financial Resource Strain (CARDIA)    Difficulty of Paying Living Expenses: Not hard at all  Food Insecurity: No Food Insecurity (09/22/2021)   Hunger Vital Sign    Worried About Running Out of Food in the Last Year: Never true    Ran Out of Food in the Last Year: Never true  Transportation Needs: Unmet Transportation Needs (08/03/2022)   PRAPARE - Hydrologist (Medical): Yes    Lack of Transportation (Non-Medical): Yes  Physical Activity: Not on file  Stress: Not on file  Social Connections: Not on file  Intimate Partner Violence: Not on file    Physical Exam      Future Appointments  Date Time Provider [place]  08/29/2022  7:20 AM [facility] DEVICE REMOTES [facility]  11/01/2022 11:30 AM [facility] PA/NP [facility] None  11/28/2022  7:20 AM [facility] DEVICE REMOTES [facility]  02/27/2023  7:20 AM [facility] DEVICE REMOTES [facility]

## 2022-08-18 ENCOUNTER — Telehealth (HOSPITAL_COMMUNITY): Payer: Self-pay | Admitting: Licensed Clinical Social Worker

## 2022-08-18 NOTE — Telephone Encounter (Signed)
H&V Care Navigation CSW Progress Note  Clinical Social Worker informed by [employer] that pt would like to apply for food stamps and is needing guidance.  Pt now living on his own part time so thinks he might qualify for stamps now that he is in his own household.  CSW set up appt for pt Monday at 9am to complete application over the phone with [employer].   SDOH Screenings   Food Insecurity: No Food Insecurity (09/22/2021)  Housing: Low Risk  (04/28/2022)  Transportation Needs: Unmet Transportation Needs (08/03/2022)  Financial Resource Strain: Low Risk  (09/22/2021)  Tobacco Use: Medium Risk (08/04/2022)    Richard [name], [facility] Desk#: [phone] Cell#: [phone]

## 2022-08-29 ENCOUNTER — Ambulatory Visit (INDEPENDENT_AMBULATORY_CARE_PROVIDER_SITE_OTHER): Payer: Medicare Other

## 2022-08-29 DIAGNOSIS — I214 Non-ST elevation (NSTEMI) myocardial infarction: Secondary | ICD-10-CM

## 2022-08-30 LAB — CUP PACEART REMOTE DEVICE CHECK
Battery Remaining Longevity: 43 mo
Battery Voltage: 2.97 V
Brady Statistic AP VP Percent: 0 %
Brady Statistic AP VS Percent: 0.22 %
Brady Statistic AS VP Percent: 0.06 %
Brady Statistic AS VS Percent: 99.72 %
Brady Statistic RA Percent Paced: 0.22 %
Brady Statistic RV Percent Paced: 0.06 %
Date Time Interrogation Session: 20240305052607
HighPow Impedance: 80 Ohm
Implantable Lead Connection Status: 753985
Implantable Lead Connection Status: 753985
Implantable Lead Implant Date: 20180719
Implantable Lead Implant Date: 20180719
Implantable Lead Location: 753859
Implantable Lead Location: 753860
Implantable Lead Model: 5076
Implantable Pulse Generator Implant Date: 20180719
Lead Channel Impedance Value: 361 Ohm
Lead Channel Impedance Value: 418 Ohm
Lead Channel Impedance Value: 456 Ohm
Lead Channel Pacing Threshold Amplitude: 0.875 V
Lead Channel Pacing Threshold Amplitude: 1 V
Lead Channel Pacing Threshold Pulse Width: 0.4 ms
Lead Channel Pacing Threshold Pulse Width: 0.4 ms
Lead Channel Sensing Intrinsic Amplitude: 4.25 mV
Lead Channel Sensing Intrinsic Amplitude: 4.25 mV
Lead Channel Sensing Intrinsic Amplitude: 5.75 mV
Lead Channel Sensing Intrinsic Amplitude: 5.75 mV
Lead Channel Setting Pacing Amplitude: 2 V
Lead Channel Setting Pacing Amplitude: 2 V
Lead Channel Setting Pacing Pulse Width: 0.4 ms
Lead Channel Setting Sensing Sensitivity: 0.3 mV
Zone Setting Status: 755011
Zone Setting Status: 755011

## 2022-08-31 ENCOUNTER — Other Ambulatory Visit (HOSPITAL_COMMUNITY): Payer: Self-pay | Admitting: Emergency Medicine

## 2022-08-31 NOTE — Progress Notes (Signed)
Paramedicine Encounter    Patient ID: Richard [name], male    DOB: January 22, 1961, 62 y.o.   MRN: [id number]   Complaints NONE  Assessment   Compliance with meds Not taken PM doses (Gabapentin & Atorvastatin  Pill box filled Bubble packs  Refills needed NONE   Meds changes since last visit NONE    Social changes Moved into his new apartment in [place]   BP (!) 140/110 (BP Location: Right Arm, Patient Position: Sitting, Cuff Size: Normal)   Pulse 70   Resp 16   SpO2 95%  Weight yesterday-not  Last visit weight-221lb  Today was the first visit in [name] new apartment.  He states he is getting adjusted to his new environment and has made some friends.  He says he has not yet applied for food stamps and says he feels like he won't qualify.  I encouraged him to at least apply just in case he does qualify.  I reviewed with him the medicaid transportation phone numbers to call for transportation to his doctor's appointment and posted those on his refrigerator.   He is compliant with is medications primarily the AM doses.  He misses his noon (Gabapentin) and evening doses (Gabapentin & Atorvastatin).  I encouraged him to be more compliant.  I am anticipating him improving with his compliance now that he's in a more stable home environment.  ACTION: Home visit completed  Skipper Cliche [id number] 08/31/22  Patient Care Team: Rip Harbour as PCP - General (Internal Medicine) Larey Dresser, MD as PCP - Advanced Heart Failure (Cardiology) Deboraha Sprang, MD as PCP - Electrophysiology (Cardiology) Jorge Ny, LCSW as Social Worker (Licensed Clinical Social Worker)  Patient Active Problem List   Diagnosis Date Noted   Hyperlipidemia 09/09/2021   Wound dehiscence 03/02/2021   Subacute osteomyelitis, right ankle and foot ([place])    Sleep disturbance    Essential hypertension    Diabetic peripheral neuropathy ([place])    Labile blood glucose     Left below-knee amputee ([place]) 02/07/2021   PAD (peripheral artery disease) ([place]) 02/01/2021   Below-knee amputation of left lower extremity ([place]) 01/27/2021   S/P BKA (below knee amputation), left (HCC)    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[place])    SOB (shortness of breath)    Open wound    Bacteremia    Chronic ulcer of left ankle (HCC)    Severe sepsis without septic shock (HCC)    COPD exacerbation ([place]) Q000111Q   Chronic systolic CHF (congestive heart failure) ([place]) 01/15/2021   Acute on chronic respiratory failure with hypoxia and hypercapnia ([place]) 01/15/2021   Acute renal failure superimposed on stage 3 chronic kidney disease ([place]) 01/15/2021   Elevated troponin 01/15/2021   Hyponatremia 01/15/2021   CHF exacerbation ([place]) 10/17/2020   Acute on chronic systolic CHF (congestive heart failure) ([place]) 10/16/2020   Acute on chronic combined systolic and diastolic CHF (congestive heart failure) ([place]) 03/15/2018   S/P right and left heart catheterization 11/02/2017   Panic attacks 11/02/2017   Non-STEMI (non-ST elevated myocardial infarction) ([place])    COPD (chronic obstructive pulmonary disease) ([place])    Hypertension    CKD (chronic kidney disease)    Single hamartoma of lung ([place])    Presence of implantable cardioverter-defibrillator (ICD)    GERD (gastroesophageal reflux disease)    Diabetes mellitus with diabetic neuropathy, with long-term current use of insulin ([place])     Current Outpatient  Medications:    albuterol (VENTOLIN HFA) 108 (90 Base) MCG/ACT inhaler, Inhale 1-2 puffs into the lungs every 4 (four) hours as needed for wheezing or shortness of breath., Disp: 8 g, Rfl: 1   allopurinol (ZYLOPRIM) 100 MG tablet, Take 1 tablet (100 mg total) by mouth daily., Disp: 30 tablet, Rfl: 3   aspirin EC 81 MG tablet, Take 81 mg by mouth daily. Swallow whole., Disp: , Rfl:    atorvastatin (LIPITOR) 40 MG tablet, TAKE ONE TABLET BY MOUTH DAILY, Disp: 90 tablet, Rfl: 10    bisoprolol (ZEBETA) 5 MG tablet, Take 1 tablet (5 mg total) by mouth daily., Disp: 90 tablet, Rfl: 3   clopidogrel (PLAVIX) 75 MG tablet, TAKE ONE TABLET BY MOUTH DAILY, Disp: 30 tablet, Rfl: 10   digoxin (LANOXIN) 0.125 MG tablet, TAKE 1 TABLET (0.125 MG TOTAL) BY MOUTH DAILY., Disp: 60 tablet, Rfl: 6   docusate sodium (COLACE) 100 MG capsule, Take 1 capsule (100 mg total) by mouth daily., Disp: 10 capsule, Rfl: 0   DULoxetine (CYMBALTA) 60 MG capsule, Take 1 capsule (60 mg total) by mouth daily., Disp: 30 capsule, Rfl: 2   empagliflozin (JARDIANCE) 25 MG TABS tablet, Take 25 mg by mouth daily., Disp: , Rfl:    fenofibrate (TRICOR) 145 MG tablet, TAKE ONE TABLET BY MOUTH DAILY, Disp: 90 tablet, Rfl: 10   gabapentin (NEURONTIN) 400 MG capsule, Take 400 mg by mouth 3 (three) times daily., Disp: , Rfl:    hydrALAZINE (APRESOLINE) 50 MG tablet, Take 1.5 tablets (75 mg total) by mouth 3 (three) times daily., Disp: 540 tablet, Rfl: 2   insulin regular human CONCENTRATED (HUMULIN R) 500 UNIT/ML injection, Inject 1 Units into the skin in the morning, at noon, and at bedtime. As needed sliding scale, Disp: , Rfl:    isosorbide mononitrate (IMDUR) 30 MG 24 hr tablet, Take 0.5 tablets (15 mg total) by mouth daily. Do not take if planning to use Viagra, Disp: 30 tablet, Rfl: 11   Magnesium Oxide 200 MG TABS, Take 2 tablets (400 mg total) by mouth daily., Disp: 60 tablet, Rfl: 11   omeprazole (PRILOSEC) 20 MG capsule, Take 20 mg by mouth daily., Disp: , Rfl:    sacubitril-valsartan (ENTRESTO) 49-51 MG, Take 1 tablet by mouth 2 (two) times daily., Disp: 60 tablet, Rfl: 11   sildenafil (VIAGRA) 50 MG tablet, TAKE 1 TABLET (50 MG TOTAL) BY MOUTH DAILY AS NEEDED FOR ERECTILE DYSFUNCTION., Disp: 10 tablet, Rfl: 0   spironolactone (ALDACTONE) 25 MG tablet, Take 1 tablet (25 mg total) by mouth daily., Disp: 90 tablet, Rfl: 3   torsemide (DEMADEX) 20 MG tablet, Take 1 tablet (20 mg total) by mouth daily., Disp: 90  tablet, Rfl: 3   zinc sulfate 220 (50 Zn) MG capsule, Take 1 capsule (220 mg total) by mouth daily., Disp: 30 capsule, Rfl: 0 Allergies  Allergen Reactions   Aspirin Itching   Codeine Hives   Coconut (Cocos Nucifera) Itching     Social History   Socioeconomic History   Marital status: Divorced    Spouse name: Not on file   Number of children: Not on file   Years of education: Not on file   Highest education level: Not on file  Occupational History   Occupation: disability    Comment: stopped working in 2000 d/t occupational exposures  Tobacco Use   Smoking status: Former    Types: Cigarettes   Smokeless tobacco: Never   Tobacco comments:    "  smoked when I drank"  Vaping Use   Vaping Use: Never used  Substance and Sexual Activity   Alcohol use: Not Currently    Comment: 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  Other Topics Concern   Not on file  Social History Narrative   Patient lives at home with wife and some of his 10 children. He self-administers his own medications.   Social Determinants of Health   Financial Resource Strain: Low Risk  (09/22/2021)   Overall Financial Resource Strain (CARDIA)    Difficulty of Paying Living Expenses: Not hard at all  Food Insecurity: Food Insecurity Present (08/18/2022)   Hunger Vital Sign    Worried About Running Out of Food in the Last Year: Sometimes true    Ran Out of Food in the Last Year: Sometimes true  Transportation Needs: Unmet Transportation Needs (08/03/2022)   PRAPARE - Hydrologist (Medical): Yes    Lack of Transportation (Non-Medical): Yes  Physical Activity: Not on file  Stress: Not on file  Social Connections: Not on file  Intimate Partner Violence: Not on file    Physical Exam      Future Appointments  Date Time Provider [place]  11/01/2022 11:30 AM [facility] PA/NP [facility] None  11/28/2022  7:20 AM  [facility] DEVICE REMOTES [facility]  02/27/2023  7:20 AM [facility] DEVICE REMOTES [facility]

## 2022-09-13 ENCOUNTER — Other Ambulatory Visit (HOSPITAL_COMMUNITY): Payer: Self-pay | Admitting: Emergency Medicine

## 2022-09-14 NOTE — Progress Notes (Signed)
Dropped of new medication bubble packs and reviewed how to properly take them.     [name], [place] 09/14/2022

## 2022-09-18 ENCOUNTER — Encounter: Payer: Self-pay | Admitting: Cardiology

## 2022-09-21 ENCOUNTER — Telehealth (HOSPITAL_COMMUNITY): Payer: Self-pay | Admitting: Licensed Clinical Social Worker

## 2022-09-21 NOTE — Telephone Encounter (Signed)
HF Paramedicine Team Based Care Meeting  HF MD- NA  HF NP - [place] NP-C   [facility] admit within the last 30 days for heart failure?  no  Medications concerns? On bubble packs but had gotten off track- paramedic working with pt to fix  Transportation issues ? patient was dependent on wife scheduling transport but now he lives alone and is learning how to do so himself  Eligible for discharge? Should be able to discharge soon once confirmed medications are in order  Jorge Ny, [place] Worker [facility] Desk#: [phone] Cell#: [phone]

## 2022-09-29 ENCOUNTER — Other Ambulatory Visit (HOSPITAL_COMMUNITY): Payer: Self-pay | Admitting: Emergency Medicine

## 2022-09-29 NOTE — Progress Notes (Signed)
Paramedicine Encounter    Patient ID: Richard [name], male    DOB: 1960/09/18, 62 y.o.   MRN: [id number]   Complaints NONE  Assessment A&O x 4, skin W&D w/ good color.  Lung sounds clear and equal bilat.  No edema noted.  Compliance with YES   Pill box filled bubble packs  Refills needed none  Meds changes since last visit none    Social changes none   BP 110/80 (BP Location: Right Arm, Patient Position: Sitting, Cuff Size: Normal)   Pulse 76   Resp 16   SpO2 97%  Weight yesterday-not taken Last visit weight-not taken  [name] reports to be doing well today.  I reviewed his bubble packs with him and it appears he is back on track with same.  He is still skipping that noon medicine dose which consists of Gabapentin, Hydralazine.  He has not been weighing as he says he no longer has a scale.   I told him I would bring him one next Friday and we discussed graduating him from paramedicine program at that time and he agreed.  ACTION: Home visit completed  Bethanie Dicker [phone] 09/29/22  Patient Care Team: Olevia Perches as PCP - General (Internal Medicine) Laurey Morale, MD as PCP - Advanced Heart Failure (Cardiology) Duke Salvia, MD as PCP - Electrophysiology (Cardiology) Burna Sis, LCSW as Social Worker (Licensed Clinical Social Worker)  Patient Active Problem List   Diagnosis Date Noted   Hyperlipidemia 09/09/2021   Wound dehiscence 03/02/2021   Subacute osteomyelitis, right ankle and foot    Sleep disturbance    Essential hypertension    Diabetic peripheral neuropathy    Labile blood glucose    Left below-knee amputee 02/07/2021   PAD (peripheral artery disease) 02/01/2021   Below-knee amputation of left lower extremity 01/27/2021   S/P BKA (below knee amputation), left    Diabetic wet gangrene of the foot    SOB (shortness of breath)    Open wound    Bacteremia    Chronic ulcer of left ankle    Severe sepsis without  septic shock    COPD exacerbation 01/15/2021   Chronic systolic CHF (congestive heart failure) 01/15/2021   Acute on chronic respiratory failure with hypoxia and hypercapnia 01/15/2021   Acute renal failure superimposed on stage 3 chronic kidney disease 01/15/2021   Elevated troponin 01/15/2021   Hyponatremia 01/15/2021   CHF exacerbation 10/17/2020   Acute on chronic systolic CHF (congestive heart failure) 10/16/2020   Acute on chronic combined systolic and diastolic CHF (congestive heart failure) 03/15/2018   S/P right and left heart catheterization 11/02/2017   Panic attacks 11/02/2017   Non-STEMI (non-ST elevated myocardial infarction)    COPD (chronic obstructive pulmonary disease)    Hypertension    CKD (chronic kidney disease)    Single hamartoma of lung    Presence of implantable cardioverter-defibrillator (ICD)    GERD (gastroesophageal reflux disease)    Diabetes mellitus with diabetic neuropathy, with long-term current use of insulin     Current Outpatient Medications:    allopurinol (ZYLOPRIM) 100 MG tablet, Take 1 tablet (100 mg total) by mouth daily., Disp: 30 tablet, Rfl: 3   aspirin EC 81 MG tablet, Take 81 mg by mouth daily. Swallow whole., Disp: , Rfl:    atorvastatin (LIPITOR) 40 MG tablet, TAKE ONE TABLET BY MOUTH DAILY, Disp: 90 tablet, Rfl: 10   bisoprolol (ZEBETA) 5 MG tablet, Take 1 tablet (5  mg total) by mouth daily., Disp: 90 tablet, Rfl: 3   clopidogrel (PLAVIX) 75 MG tablet, TAKE ONE TABLET BY MOUTH DAILY, Disp: 30 tablet, Rfl: 10   digoxin (LANOXIN) 0.125 MG tablet, TAKE 1 TABLET (0.125 MG TOTAL) BY MOUTH DAILY., Disp: 60 tablet, Rfl: 6   docusate sodium (COLACE) 100 MG capsule, Take 1 capsule (100 mg total) by mouth daily., Disp: 10 capsule, Rfl: 0   DULoxetine (CYMBALTA) 60 MG capsule, Take 1 capsule (60 mg total) by mouth daily., Disp: 30 capsule, Rfl: 2   empagliflozin (JARDIANCE) 25 MG TABS tablet, Take 25 mg by mouth daily., Disp: , Rfl:     fenofibrate (TRICOR) 145 MG tablet, TAKE ONE TABLET BY MOUTH DAILY, Disp: 90 tablet, Rfl: 10   gabapentin (NEURONTIN) 400 MG capsule, Take 400 mg by mouth 3 (three) times daily., Disp: , Rfl:    hydrALAZINE (APRESOLINE) 50 MG tablet, Take 1.5 tablets (75 mg total) by mouth 3 (three) times daily., Disp: 540 tablet, Rfl: 2   insulin regular human CONCENTRATED (HUMULIN R) 500 UNIT/ML injection, Inject 1 Units into the skin in the morning, at noon, and at bedtime. As needed sliding scale, Disp: , Rfl:    isosorbide mononitrate (IMDUR) 30 MG 24 hr tablet, Take 0.5 tablets (15 mg total) by mouth daily. Do not take if planning to use Viagra, Disp: 30 tablet, Rfl: 11   Magnesium Oxide 200 MG TABS, Take 2 tablets (400 mg total) by mouth daily., Disp: 60 tablet, Rfl: 11   omeprazole (PRILOSEC) 20 MG capsule, Take 20 mg by mouth daily., Disp: , Rfl:    sacubitril-valsartan (ENTRESTO) 49-51 MG, Take 1 tablet by mouth 2 (two) times daily., Disp: 60 tablet, Rfl: 11   spironolactone (ALDACTONE) 25 MG tablet, Take 1 tablet (25 mg total) by mouth daily., Disp: 90 tablet, Rfl: 3   torsemide (DEMADEX) 20 MG tablet, Take 1 tablet (20 mg total) by mouth daily., Disp: 90 tablet, Rfl: 3   zinc sulfate 220 (50 Zn) MG capsule, Take 1 capsule (220 mg total) by mouth daily., Disp: 30 capsule, Rfl: 0   albuterol (VENTOLIN HFA) 108 (90 Base) MCG/ACT inhaler, Inhale 1-2 puffs into the lungs every 4 (four) hours as needed for wheezing or shortness of breath. (Patient not taking: Reported on 09/29/2022), Disp: 8 g, Rfl: 1   sildenafil (VIAGRA) 50 MG tablet, TAKE 1 TABLET (50 MG TOTAL) BY MOUTH DAILY AS NEEDED FOR ERECTILE DYSFUNCTION. (Patient not taking: Reported on 09/29/2022), Disp: 10 tablet, Rfl: 0 Allergies  Allergen Reactions   Aspirin Itching   Codeine Hives   Coconut (Cocos Nucifera) Itching     Social History   Socioeconomic History   Marital status: Divorced    Spouse name: Not on file   Number of children: Not on  file   Years of education: Not on file   Highest education level: Not on file  Occupational History   Occupation: disability    Comment: stopped working in 2000 d/t occupational exposures  Tobacco Use   Smoking status: Former    Types: Cigarettes   Smokeless tobacco: Never   Tobacco comments:    "smoked when I drank"  Vaping Use   Vaping Use: Never used  Substance and Sexual Activity   Alcohol use: Not Currently    Comment: 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  Other Topics Concern   Not on file  Social History Narrative   Patient lives at home with wife and some of his 10 children. He self-administers his own medications.   Social Determinants of Health   Financial Resource Strain: Low Risk  (09/22/2021)   Overall Financial Resource Strain (CARDIA)    Difficulty of Paying Living Expenses: Not hard at all  Food Insecurity: Food Insecurity Present (08/18/2022)   Hunger Vital Sign    Worried About Running Out of Food in the Last Year: Sometimes true    Ran Out of Food in the Last Year: Sometimes true  Transportation Needs: Unmet Transportation Needs (08/03/2022)   PRAPARE - Administrator, Civil ServiceTransportation    Lack of Transportation (Medical): Yes    Lack of Transportation (Non-Medical): Yes  Physical Activity: Not on file  Stress: Not on file  Social Connections: Not on file  Intimate Partner Violence: Not on file    Physical Exam      Future Appointments  Date Time Provider Department Center  11/01/2022 11:30 AM [facility] PA/NP [facility] None  11/28/2022  7:20 AM [facility] DEVICE REMOTES [facility]  02/27/2023  7:20 AM [facility] DEVICE REMOTES [facility]

## 2022-10-04 NOTE — Progress Notes (Signed)
Remote ICD transmission.   

## 2022-10-06 ENCOUNTER — Other Ambulatory Visit (HOSPITAL_COMMUNITY): Payer: Self-pay | Admitting: Emergency Medicine

## 2022-10-06 NOTE — Progress Notes (Signed)
Paramedicine Encounter    Patient ID: Richard [name], male    DOB: [birth year], 61 y.o.   MRN: [id number]   Complaints NONE  Assessment A&O x 4, skin W&D w/ good color.  No edema noted.  Compliance with meds On track w/ bubble packs.  Does not consistantly take his noon meds but this is baseline for him  Pill box filled N/A  Refills needed None   Meds changes since last visit None   Social changes None   BP 130/80 (BP Location: Left Arm, Patient Position: Sitting, Cuff Size: Normal)   Pulse 70   Resp 16   Wt 225 lb 9.6 oz (102.3 kg)   SpO2 97%   BMI 26.75 kg/m  Weight yesterday-not taken Last visit weight- 231lb (08/17/22)  Today is graduation for [name] from the paramedicine program.  I provided him a new scale today due to his old one being broken in the move to his new apartment.  He states that he is feeling much better now that he's settled in to his new residence and is used to his new surroundings.  Reviewed numbers for the HF Clinic, his PCP and Pharmacy and posted this info in the kitchen on the refrigerator.     ACTION: Home visit completed  Bethanie Dicker [phone] 10/06/22  Patient Care Team: Olevia Perches as PCP - General (Internal Medicine) Laurey Morale, MD as PCP - Advanced Heart Failure (Cardiology) Duke Salvia, MD as PCP - Electrophysiology (Cardiology) Burna Sis, LCSW as Social Worker (Licensed Clinical Social Worker)  Patient Active Problem List   Diagnosis Date Noted   Hyperlipidemia 09/09/2021   Wound dehiscence 03/02/2021   Subacute osteomyelitis, right ankle and foot    Sleep disturbance    Essential hypertension    Diabetic peripheral neuropathy    Labile blood glucose    Left below-knee amputee 02/07/2021   PAD (peripheral artery disease) 02/01/2021   Below-knee amputation of left lower extremity 01/27/2021   S/P BKA (below knee amputation), left    Diabetic wet gangrene of the foot    SOB (shortness  of breath)    Open wound    Bacteremia    Chronic ulcer of left ankle    Severe sepsis without septic shock    COPD exacerbation 01/15/2021   Chronic systolic CHF (congestive heart failure) 01/15/2021   Acute on chronic respiratory failure with hypoxia and hypercapnia 01/15/2021   Acute renal failure superimposed on stage 3 chronic kidney disease 01/15/2021   Elevated troponin 01/15/2021   Hyponatremia 01/15/2021   CHF exacerbation 10/17/2020   Acute on chronic systolic CHF (congestive heart failure) 10/16/2020   Acute on chronic combined systolic and diastolic CHF (congestive heart failure) 03/15/2018   S/P right and left heart catheterization 11/02/2017   Panic attacks 11/02/2017   Non-STEMI (non-ST elevated myocardial infarction)    COPD (chronic obstructive pulmonary disease)    Hypertension    CKD (chronic kidney disease)    Single hamartoma of lung    Presence of implantable cardioverter-defibrillator (ICD)    GERD (gastroesophageal reflux disease)    Diabetes mellitus with diabetic neuropathy, with long-term current use of insulin     Current Outpatient Medications:    albuterol (VENTOLIN HFA) 108 (90 Base) MCG/ACT inhaler, Inhale 1-2 puffs into the lungs every 4 (four) hours as needed for wheezing or shortness of breath. (Patient not taking: Reported on 09/29/2022), Disp: 8 g, Rfl: 1   allopurinol (ZYLOPRIM) 100  MG tablet, Take 1 tablet (100 mg total) by mouth daily., Disp: 30 tablet, Rfl: 3   aspirin EC 81 MG tablet, Take 81 mg by mouth daily. Swallow whole., Disp: , Rfl:    atorvastatin (LIPITOR) 40 MG tablet, TAKE ONE TABLET BY MOUTH DAILY, Disp: 90 tablet, Rfl: 10   bisoprolol (ZEBETA) 5 MG tablet, Take 1 tablet (5 mg total) by mouth daily., Disp: 90 tablet, Rfl: 3   clopidogrel (PLAVIX) 75 MG tablet, TAKE ONE TABLET BY MOUTH DAILY, Disp: 30 tablet, Rfl: 10   digoxin (LANOXIN) 0.125 MG tablet, TAKE 1 TABLET (0.125 MG TOTAL) BY MOUTH DAILY., Disp: 60 tablet, Rfl: 6    docusate sodium (COLACE) 100 MG capsule, Take 1 capsule (100 mg total) by mouth daily., Disp: 10 capsule, Rfl: 0   DULoxetine (CYMBALTA) 60 MG capsule, Take 1 capsule (60 mg total) by mouth daily., Disp: 30 capsule, Rfl: 2   empagliflozin (JARDIANCE) 25 MG TABS tablet, Take 25 mg by mouth daily., Disp: , Rfl:    fenofibrate (TRICOR) 145 MG tablet, TAKE ONE TABLET BY MOUTH DAILY, Disp: 90 tablet, Rfl: 10   gabapentin (NEURONTIN) 400 MG capsule, Take 400 mg by mouth 3 (three) times daily., Disp: , Rfl:    hydrALAZINE (APRESOLINE) 50 MG tablet, Take 1.5 tablets (75 mg total) by mouth 3 (three) times daily., Disp: 540 tablet, Rfl: 2   insulin regular human CONCENTRATED (HUMULIN R) 500 UNIT/ML injection, Inject 1 Units into the skin in the morning, at noon, and at bedtime. As needed sliding scale, Disp: , Rfl:    isosorbide mononitrate (IMDUR) 30 MG 24 hr tablet, Take 0.5 tablets (15 mg total) by mouth daily. Do not take if planning to use Viagra, Disp: 30 tablet, Rfl: 11   Magnesium Oxide 200 MG TABS, Take 2 tablets (400 mg total) by mouth daily., Disp: 60 tablet, Rfl: 11   omeprazole (PRILOSEC) 20 MG capsule, Take 20 mg by mouth daily., Disp: , Rfl:    sacubitril-valsartan (ENTRESTO) 49-51 MG, Take 1 tablet by mouth 2 (two) times daily., Disp: 60 tablet, Rfl: 11   sildenafil (VIAGRA) 50 MG tablet, TAKE 1 TABLET (50 MG TOTAL) BY MOUTH DAILY AS NEEDED FOR ERECTILE DYSFUNCTION. (Patient not taking: Reported on 09/29/2022), Disp: 10 tablet, Rfl: 0   spironolactone (ALDACTONE) 25 MG tablet, Take 1 tablet (25 mg total) by mouth daily., Disp: 90 tablet, Rfl: 3   torsemide (DEMADEX) 20 MG tablet, Take 1 tablet (20 mg total) by mouth daily., Disp: 90 tablet, Rfl: 3   zinc sulfate 220 (50 Zn) MG capsule, Take 1 capsule (220 mg total) by mouth daily., Disp: 30 capsule, Rfl: 0 Allergies  Allergen Reactions   Aspirin Itching   Codeine Hives   Coconut (Cocos Nucifera) Itching     Social History   Socioeconomic  History   Marital status: Divorced    Spouse name: Not on file   Number of children: Not on file   Years of education: Not on file   Highest education level: Not on file  Occupational History   Occupation: disability    Comment: stopped working in 2000 d/t occupational exposures  Tobacco Use   Smoking status: Former    Types: Cigarettes   Smokeless tobacco: Never   Tobacco comments:    "smoked when I drank"  Vaping Use   Vaping Use: Never used  Substance and Sexual Activity   Alcohol use: Not Currently    Comment: 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 He self-administers his own medications.   Social Determinants of Health   Financial Resource Strain: Low Risk  (09/22/2021)   Overall Financial Resource Strain (CARDIA)    Difficulty of Paying Living Expenses: Not hard at all  Food Insecurity: Food Insecurity Present (08/18/2022)   Hunger Vital Sign    Worried About Running Out of Food in the Last Year: Sometimes true    Ran Out of Food in the Last Year: Sometimes true  Transportation Needs: Unmet Transportation Needs (08/03/2022)   PRAPARE - Administrator, Civil Service (Medical): Yes    Lack of Transportation (Non-Medical): Yes  Physical Activity: Not on file  Stress: Not on file  Social Connections: Not on file  Intimate Partner Violence: Not on file    Physical Exam      Future Appointments  Date Time Provider Department Center  11/01/2022 11:30 AM [facility] PA/NP [facility] None  11/28/2022  7:20 AM [facility] DEVICE REMOTES [facility]  02/27/2023  7:20 AM [facility] DEVICE REMOTES [facility]       Patient is now discharged from [employer].  Patient has/has not met the following goals:  Yes :Patient expresses basic  understanding of medications and what they are for Yes :Patient able to verbalize heart failure specific dietary/fluid restrictions Yes :Patient is aware of who to call if they have medical concerns or if they need to schedule or change appts Yes :Patient has a scale for daily weights and weighs regularly Yes :Patient able to verbalize concerning symptoms when they should call the HF clinic (weight gain ranges, etc) Yes :Patient has a PCP and has seen within the past year or has upcoming appt Yes :Patient has reliable access to getting their medications Yes :Patient has shown they are able to reorder medications reliably No :Patient has had admission in past 30 days- if yes how many? No :Patient has had admission in past 90 days- if yes how many?  Discharge Comments: Mr. Grainger has done well with paramedicine assistance.  He has improved with his med compliance since starting bubble packs.  He has numbers for transportation and verbalizes who he needs to call for appointments and/or emergencies. Advised him that he can reach out to me at any time should he need assistance.

## 2022-10-23 ENCOUNTER — Other Ambulatory Visit (HOSPITAL_COMMUNITY): Payer: Self-pay | Admitting: Cardiology

## 2022-10-23 ENCOUNTER — Other Ambulatory Visit (HOSPITAL_COMMUNITY): Payer: Self-pay | Admitting: Family Medicine

## 2022-10-24 ENCOUNTER — Other Ambulatory Visit (HOSPITAL_COMMUNITY): Payer: Self-pay | Admitting: Family Medicine

## 2022-10-31 ENCOUNTER — Telehealth (HOSPITAL_COMMUNITY): Payer: Self-pay

## 2022-10-31 NOTE — Telephone Encounter (Signed)
Called to confirm/remind patient of their appointment at the [facility] on 11/01/22. However, patient's phone number was invalid. A message was left on patient's friend [name]'s phone answering machine.

## 2022-11-01 ENCOUNTER — Encounter (HOSPITAL_COMMUNITY): Payer: Medicare Other

## 2022-11-15 IMAGING — CR DG CHEST 2V
2 series · 2 of 2 positions shown · non-contrast
Comparison: Radiograph yesterday.

CLINICAL DATA: Shortness of breath with lower extremity edema.

EXAM:
CHEST - 2 VIEW

[chest lat]
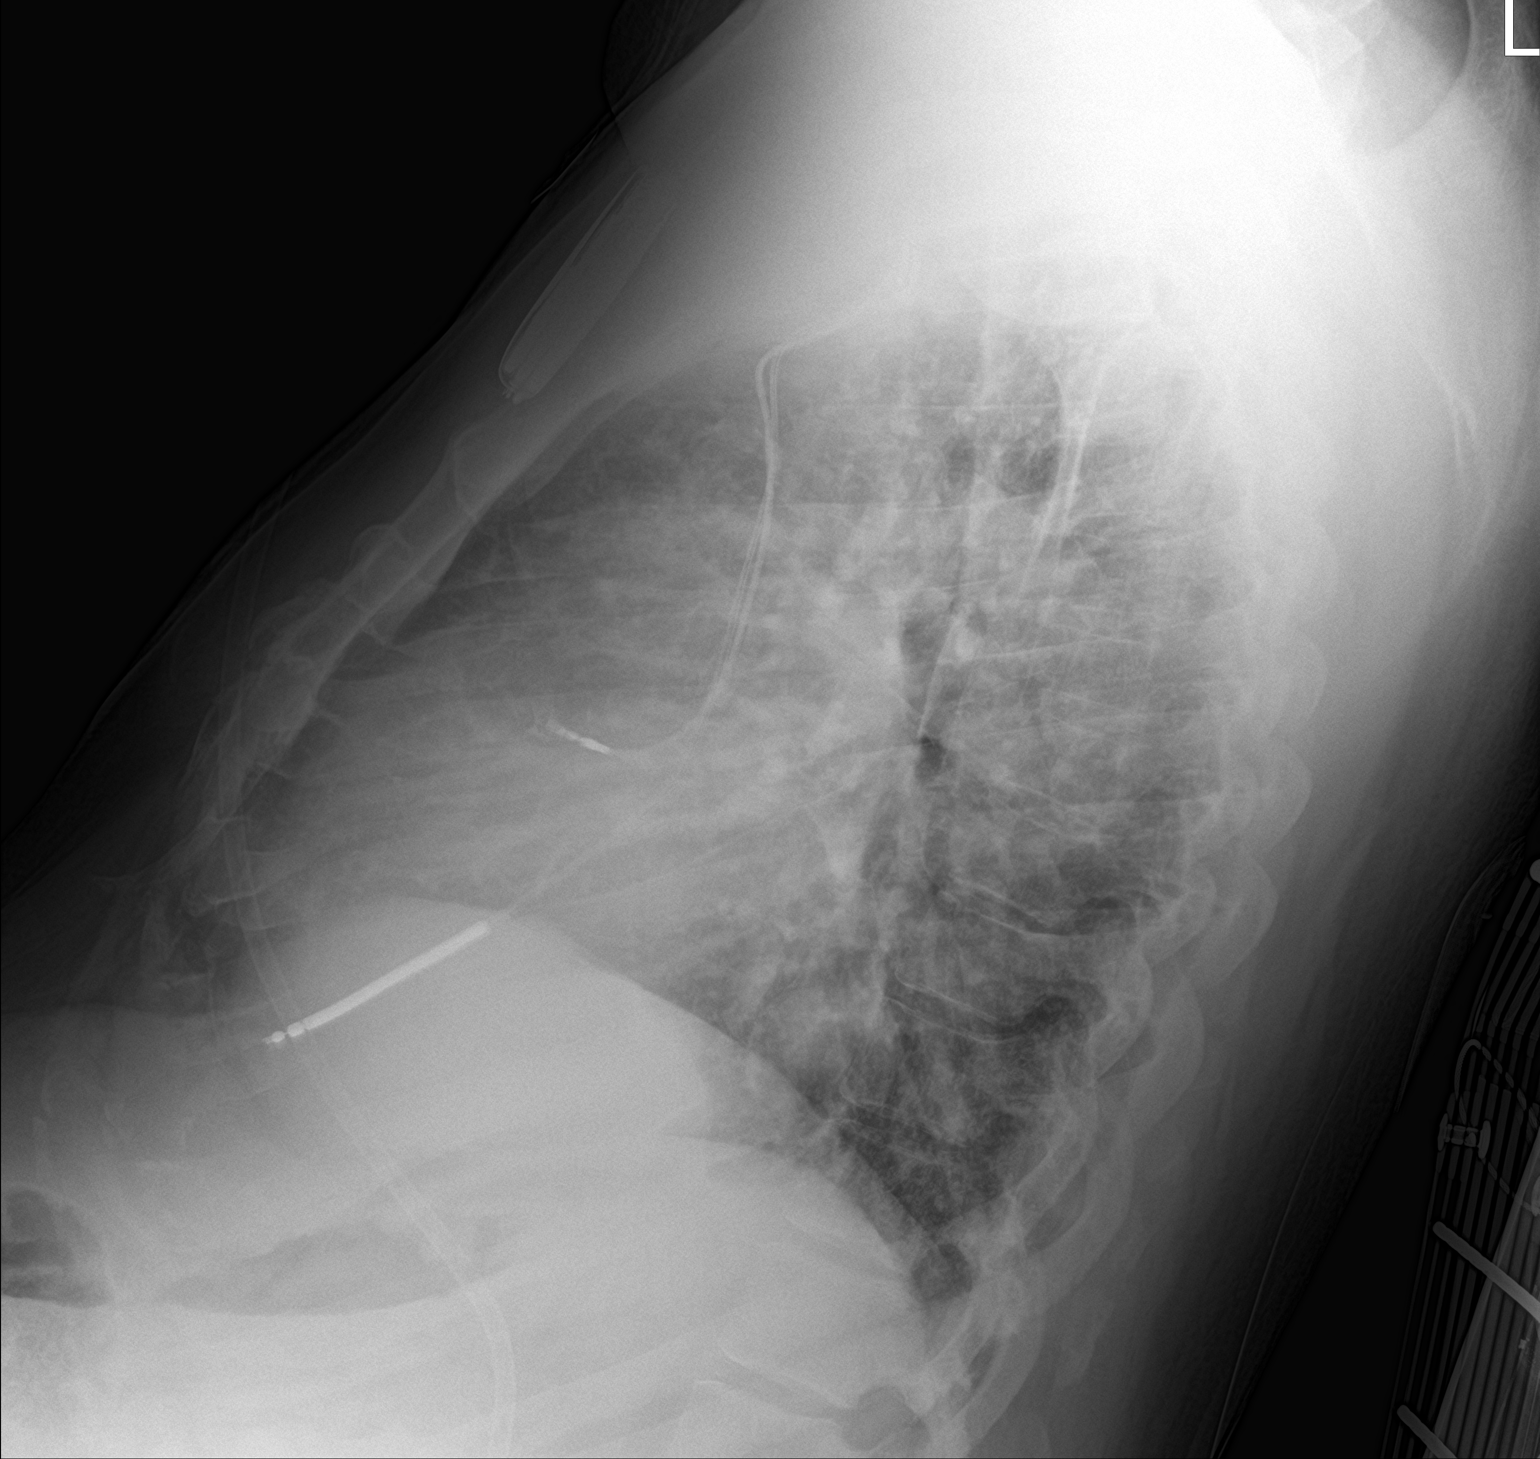

[chest ap]
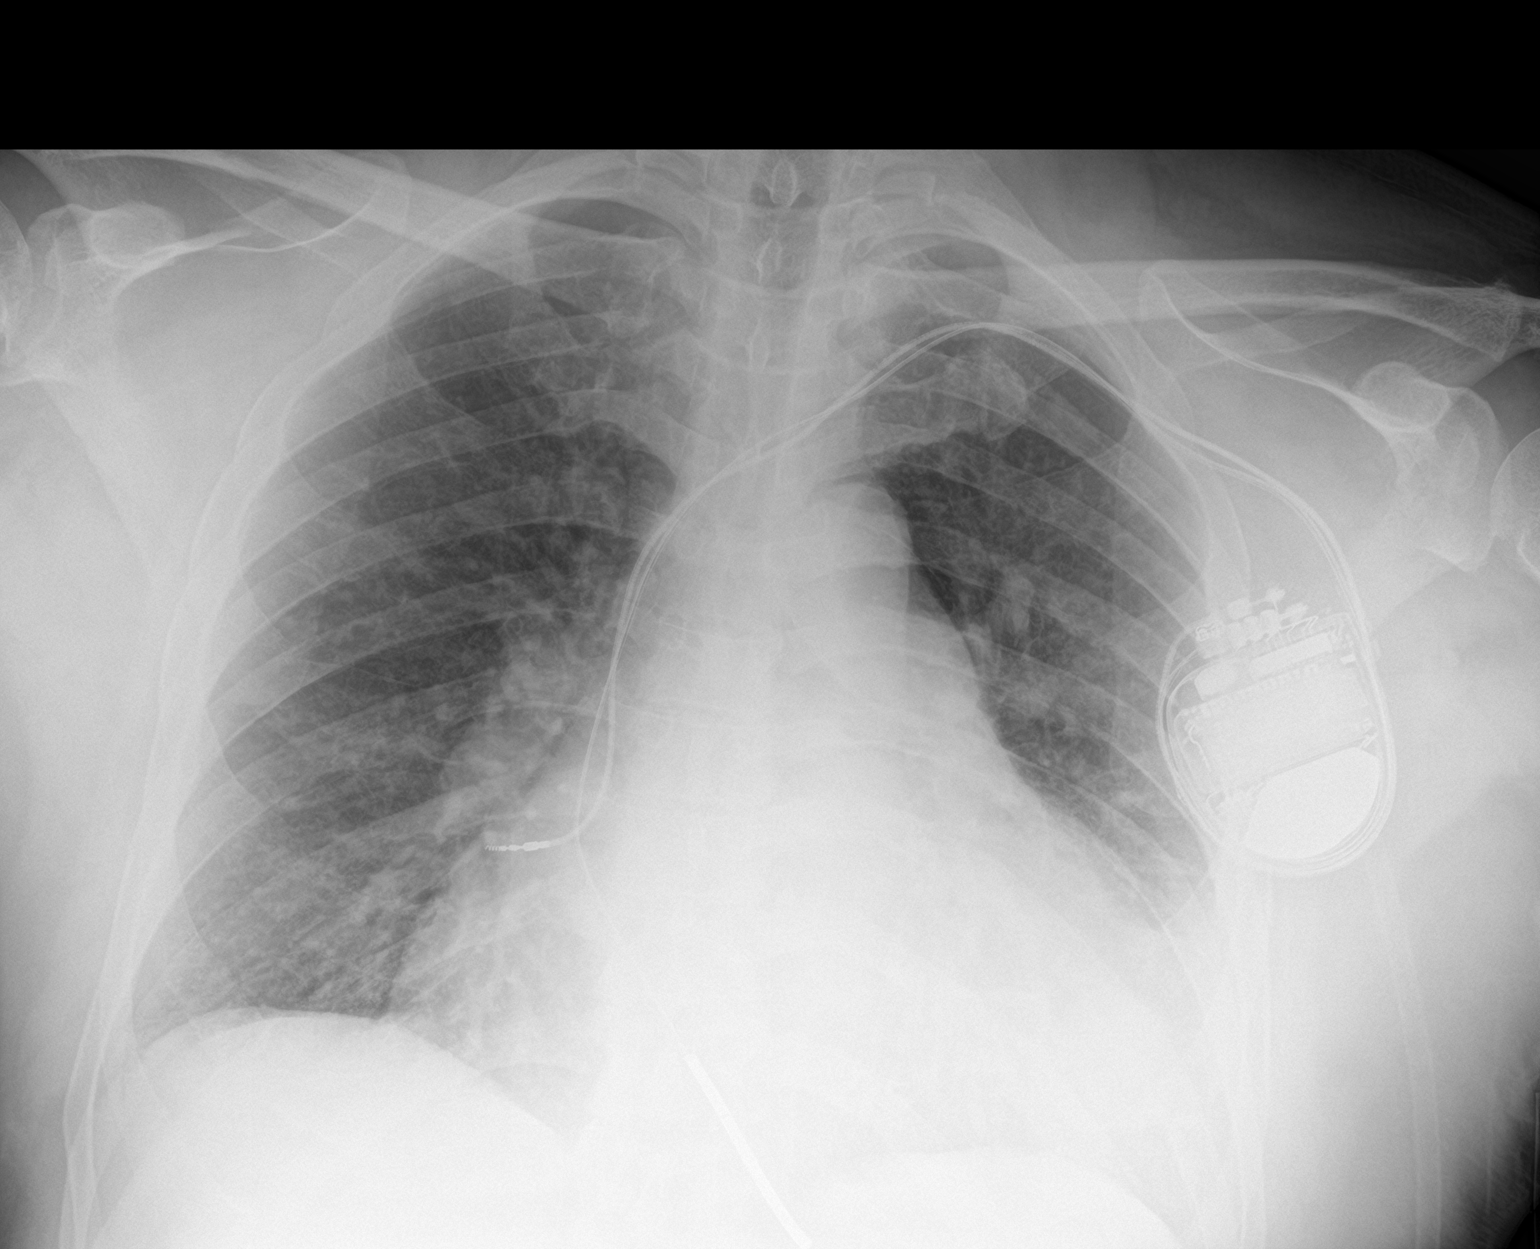

[2 of 2 positions shown; findings below may reference images not displayed]

FINDINGS: Left-sided pacemaker in place. Cardiomegaly is again seen, not
significantly changed from yesterday allowing for differences in
technique. Worsening interstitial opacities suspicious for pulmonary
edema. No significant pleural effusion. Left basilar pulmonary
nodule is not well seen on the current exam, obscured by overlapping
soft tissue structures. No pneumothorax. Stable osseous structures.
IMPRESSION: 1. Worsening interstitial opacities since yesterday suspicious for
pulmonary edema. Cardiomegaly not significantly changed.
2. Left lower lobe pulmonary nodule is not well seen on the current
exam.

## 2022-11-17 ENCOUNTER — Telehealth (HOSPITAL_COMMUNITY): Payer: Self-pay | Admitting: Licensed Clinical Social Worker

## 2022-11-17 NOTE — Telephone Encounter (Signed)
Paramedic had contact with pt and they reported they were unaware of recent missed appt and had transport barriers.  CSW attempted to call to help with scheduling appt and discussing transport but phone number not working.  Will continue attempts  Burna Sis, LCSW Clinical Social Worker [facility] Desk#: [phone] Cell#: [phone]

## 2022-11-21 ENCOUNTER — Other Ambulatory Visit (HOSPITAL_COMMUNITY): Payer: Self-pay | Admitting: Cardiology

## 2022-11-27 ENCOUNTER — Telehealth (HOSPITAL_COMMUNITY): Payer: Self-pay | Admitting: Licensed Clinical Social Worker

## 2022-11-27 NOTE — Telephone Encounter (Signed)
CSW attempted to reach patient regarding missed appt and need for transportation assistance- unable to reach- unable to leave VM  Burna Sis, LCSW Clinical Social Worker [facility] Desk#: [phone] Cell#: [phone]

## 2022-11-28 ENCOUNTER — Ambulatory Visit (INDEPENDENT_AMBULATORY_CARE_PROVIDER_SITE_OTHER): Payer: Medicare Other

## 2022-11-28 DIAGNOSIS — I428 Other cardiomyopathies: Secondary | ICD-10-CM | POA: Diagnosis not present

## 2022-11-28 DIAGNOSIS — I5022 Chronic systolic (congestive) heart failure: Secondary | ICD-10-CM

## 2022-11-28 LAB — CUP PACEART REMOTE DEVICE CHECK
Battery Remaining Longevity: 39 mo
Battery Voltage: 2.97 V
Brady Statistic AP VP Percent: 0.01 %
Brady Statistic AP VS Percent: 3.98 %
Brady Statistic AS VP Percent: 0.08 %
Brady Statistic AS VS Percent: 95.92 %
Brady Statistic RA Percent Paced: 3.95 %
Brady Statistic RV Percent Paced: 0.09 %
Date Time Interrogation Session: 20240604043624
HighPow Impedance: 80 Ohm
Implantable Lead Connection Status: 753985
Implantable Lead Connection Status: 753985
Implantable Lead Implant Date: 20180719
Implantable Lead Implant Date: 20180719
Implantable Lead Location: 753859
Implantable Lead Location: 753860
Implantable Lead Model: 5076
Implantable Pulse Generator Implant Date: 20180719
Lead Channel Impedance Value: 361 Ohm
Lead Channel Impedance Value: 418 Ohm
Lead Channel Impedance Value: 456 Ohm
Lead Channel Pacing Threshold Amplitude: 0.75 V
Lead Channel Pacing Threshold Amplitude: 1 V
Lead Channel Pacing Threshold Pulse Width: 0.4 ms
Lead Channel Pacing Threshold Pulse Width: 0.4 ms
Lead Channel Sensing Intrinsic Amplitude: 3.875 mV
Lead Channel Sensing Intrinsic Amplitude: 3.875 mV
Lead Channel Sensing Intrinsic Amplitude: 4 mV
Lead Channel Sensing Intrinsic Amplitude: 4 mV
Lead Channel Setting Pacing Amplitude: 2 V
Lead Channel Setting Pacing Amplitude: 2 V
Lead Channel Setting Pacing Pulse Width: 0.4 ms
Lead Channel Setting Sensing Sensitivity: 0.3 mV
Zone Setting Status: 755011
Zone Setting Status: 755011

## 2022-12-07 ENCOUNTER — Other Ambulatory Visit (HOSPITAL_COMMUNITY): Payer: Self-pay | Admitting: Cardiology

## 2022-12-18 ENCOUNTER — Other Ambulatory Visit (HOSPITAL_COMMUNITY): Payer: Self-pay | Admitting: Family Medicine

## 2022-12-20 NOTE — Progress Notes (Signed)
Remote ICD transmission.   

## 2023-02-21 ENCOUNTER — Other Ambulatory Visit (HOSPITAL_COMMUNITY): Payer: Self-pay | Admitting: Family Medicine

## 2023-02-27 ENCOUNTER — Ambulatory Visit (INDEPENDENT_AMBULATORY_CARE_PROVIDER_SITE_OTHER): Payer: Medicare Other

## 2023-02-27 DIAGNOSIS — I5022 Chronic systolic (congestive) heart failure: Secondary | ICD-10-CM

## 2023-02-27 DIAGNOSIS — I428 Other cardiomyopathies: Secondary | ICD-10-CM | POA: Diagnosis not present

## 2023-02-27 LAB — CUP PACEART REMOTE DEVICE CHECK
Battery Remaining Longevity: 36 mo
Battery Voltage: 2.96 V
Brady Statistic AP VP Percent: 0.01 %
Brady Statistic AP VS Percent: 6.24 %
Brady Statistic AS VP Percent: 0.08 %
Brady Statistic AS VS Percent: 93.67 %
Brady Statistic RA Percent Paced: 6.18 %
Brady Statistic RV Percent Paced: 0.08 %
Date Time Interrogation Session: 20240903043826
HighPow Impedance: 68 Ohm
Implantable Lead Connection Status: 753985
Implantable Lead Connection Status: 753985
Implantable Lead Implant Date: 20180719
Implantable Lead Implant Date: 20180719
Implantable Lead Location: 753859
Implantable Lead Location: 753860
Implantable Lead Model: 5076
Implantable Pulse Generator Implant Date: 20180719
Lead Channel Impedance Value: 304 Ohm
Lead Channel Impedance Value: 361 Ohm
Lead Channel Impedance Value: 399 Ohm
Lead Channel Pacing Threshold Amplitude: 0.875 V
Lead Channel Pacing Threshold Amplitude: 1 V
Lead Channel Pacing Threshold Pulse Width: 0.4 ms
Lead Channel Pacing Threshold Pulse Width: 0.4 ms
Lead Channel Sensing Intrinsic Amplitude: 3.125 mV
Lead Channel Sensing Intrinsic Amplitude: 3.125 mV
Lead Channel Sensing Intrinsic Amplitude: 5.25 mV
Lead Channel Sensing Intrinsic Amplitude: 5.25 mV
Lead Channel Setting Pacing Amplitude: 2 V
Lead Channel Setting Pacing Amplitude: 2.25 V
Lead Channel Setting Pacing Pulse Width: 0.4 ms
Lead Channel Setting Sensing Sensitivity: 0.3 mV
Zone Setting Status: 755011
Zone Setting Status: 755011

## 2023-03-06 NOTE — Progress Notes (Signed)
Remote ICD transmission.   

## 2023-04-16 ENCOUNTER — Other Ambulatory Visit (HOSPITAL_COMMUNITY): Payer: Self-pay | Admitting: Family Medicine

## 2023-05-07 ENCOUNTER — Other Ambulatory Visit (HOSPITAL_COMMUNITY): Payer: Self-pay | Admitting: Family Medicine

## 2023-08-23 ENCOUNTER — Other Ambulatory Visit (HOSPITAL_COMMUNITY): Payer: Self-pay | Admitting: Family Medicine

## 2023-08-23 ENCOUNTER — Other Ambulatory Visit (HOSPITAL_COMMUNITY): Payer: Self-pay | Admitting: Cardiology

## 2023-09-03 ENCOUNTER — Other Ambulatory Visit (HOSPITAL_COMMUNITY): Payer: Self-pay | Admitting: Cardiology

## 2023-10-08 ENCOUNTER — Other Ambulatory Visit (HOSPITAL_COMMUNITY): Payer: Self-pay | Admitting: Family Medicine

## 2023-10-08 ENCOUNTER — Other Ambulatory Visit (HOSPITAL_COMMUNITY): Payer: Self-pay | Admitting: Cardiology

## 2023-11-12 ENCOUNTER — Other Ambulatory Visit (HOSPITAL_COMMUNITY): Payer: Self-pay | Admitting: Cardiology

## 2023-11-14 ENCOUNTER — Telehealth (HOSPITAL_COMMUNITY): Payer: Self-pay | Admitting: Cardiology

## 2023-11-14 NOTE — Telephone Encounter (Signed)
 Front office called pt on telephone about scheduling a f/u appt with Dr. Mitzie Anda. Per Chuckie Craven, CMA this patient is overdue for a f/u appt in the [facility]. Telephone number is out of service and front office is unable to send pt a MyChart message.

## 2024-01-08 ENCOUNTER — Other Ambulatory Visit (HOSPITAL_COMMUNITY): Payer: Self-pay | Admitting: Cardiology

## 2024-03-11 ENCOUNTER — Other Ambulatory Visit (HOSPITAL_COMMUNITY): Payer: Self-pay | Admitting: Cardiology

## 2024-03-11 ENCOUNTER — Other Ambulatory Visit (HOSPITAL_COMMUNITY): Payer: Self-pay | Admitting: Family Medicine

## 2024-04-14 ENCOUNTER — Other Ambulatory Visit (HOSPITAL_COMMUNITY): Payer: Self-pay | Admitting: Cardiology

## 2024-05-14 ENCOUNTER — Other Ambulatory Visit (HOSPITAL_COMMUNITY): Payer: Self-pay | Admitting: Cardiology

## 2024-05-23 ENCOUNTER — Other Ambulatory Visit (HOSPITAL_COMMUNITY): Payer: Self-pay | Admitting: Cardiology

## 2024-05-27 ENCOUNTER — Ambulatory Visit: Payer: Medicare Other

## 2024-05-27 DIAGNOSIS — I428 Other cardiomyopathies: Secondary | ICD-10-CM | POA: Diagnosis not present

## 2024-05-28 LAB — CUP PACEART REMOTE DEVICE CHECK
Battery Remaining Longevity: 29 mo
Battery Voltage: 2.95 V
Brady Statistic AP VP Percent: 0.01 %
Brady Statistic AP VS Percent: 6.62 %
Brady Statistic AS VP Percent: 0.07 %
Brady Statistic AS VS Percent: 93.3 %
Brady Statistic RA Percent Paced: 6.6 %
Brady Statistic RV Percent Paced: 0.07 %
Date Time Interrogation Session: 20251202022605
HighPow Impedance: 66 Ohm
Implantable Lead Connection Status: 753985
Implantable Lead Connection Status: 753985
Implantable Lead Implant Date: 20180719
Implantable Lead Implant Date: 20180719
Implantable Lead Location: 753859
Implantable Lead Location: 753860
Implantable Lead Model: 5076
Implantable Pulse Generator Implant Date: 20180719
Lead Channel Impedance Value: 285 Ohm
Lead Channel Impedance Value: 342 Ohm
Lead Channel Impedance Value: 361 Ohm
Lead Channel Pacing Threshold Amplitude: 0.75 V
Lead Channel Pacing Threshold Amplitude: 1.125 V
Lead Channel Pacing Threshold Pulse Width: 0.4 ms
Lead Channel Pacing Threshold Pulse Width: 0.4 ms
Lead Channel Sensing Intrinsic Amplitude: 4 mV
Lead Channel Sensing Intrinsic Amplitude: 4 mV
Lead Channel Sensing Intrinsic Amplitude: 5.75 mV
Lead Channel Sensing Intrinsic Amplitude: 5.75 mV
Lead Channel Setting Pacing Amplitude: 2 V
Lead Channel Setting Pacing Amplitude: 2.25 V
Lead Channel Setting Pacing Pulse Width: 0.4 ms
Lead Channel Setting Sensing Sensitivity: 0.3 mV
Zone Setting Status: 755011
Zone Setting Status: 755011

## 2024-05-29 ENCOUNTER — Ambulatory Visit: Payer: Self-pay | Admitting: Cardiology

## 2024-05-30 NOTE — Progress Notes (Signed)
 Remote ICD Transmission

## 2024-07-07 ENCOUNTER — Other Ambulatory Visit (HOSPITAL_COMMUNITY): Payer: Self-pay | Admitting: Cardiology

## 2024-08-01 ENCOUNTER — Other Ambulatory Visit (HOSPITAL_COMMUNITY): Payer: Self-pay | Admitting: Family Medicine

## 2024-08-01 ENCOUNTER — Other Ambulatory Visit (HOSPITAL_COMMUNITY): Payer: Self-pay | Admitting: Cardiology
